# Patient Record
Sex: Female | Born: 1935
Health system: Southern US, Community
[De-identification: ages and names within clinical notes are randomized; demographics above are authoritative.]

## PROBLEM LIST (undated history)

## (undated) DIAGNOSIS — R519 Headache, unspecified: Secondary | ICD-10-CM

## (undated) DIAGNOSIS — G4700 Insomnia, unspecified: Secondary | ICD-10-CM

## (undated) DIAGNOSIS — M1711 Unilateral primary osteoarthritis, right knee: Secondary | ICD-10-CM

## (undated) DIAGNOSIS — K219 Gastro-esophageal reflux disease without esophagitis: Secondary | ICD-10-CM

## (undated) DIAGNOSIS — M810 Age-related osteoporosis without current pathological fracture: Secondary | ICD-10-CM

## (undated) DIAGNOSIS — R51 Headache: Secondary | ICD-10-CM

## (undated) DIAGNOSIS — T7840XA Allergy, unspecified, initial encounter: Secondary | ICD-10-CM

## (undated) DIAGNOSIS — I639 Cerebral infarction, unspecified: Secondary | ICD-10-CM

## (undated) DIAGNOSIS — R7989 Other specified abnormal findings of blood chemistry: Secondary | ICD-10-CM

## (undated) DIAGNOSIS — E039 Hypothyroidism, unspecified: Secondary | ICD-10-CM

## (undated) DIAGNOSIS — F419 Anxiety disorder, unspecified: Secondary | ICD-10-CM

## (undated) DIAGNOSIS — K5730 Diverticulosis of large intestine without perforation or abscess without bleeding: Secondary | ICD-10-CM

## (undated) DIAGNOSIS — G8929 Other chronic pain: Secondary | ICD-10-CM

## (undated) DIAGNOSIS — Z79890 Hormone replacement therapy (postmenopausal): Secondary | ICD-10-CM

## (undated) DIAGNOSIS — N189 Chronic kidney disease, unspecified: Secondary | ICD-10-CM

## (undated) DIAGNOSIS — J309 Allergic rhinitis, unspecified: Secondary | ICD-10-CM

## (undated) DIAGNOSIS — E785 Hyperlipidemia, unspecified: Secondary | ICD-10-CM

## (undated) DIAGNOSIS — IMO0002 Reserved for concepts with insufficient information to code with codable children: Secondary | ICD-10-CM

## (undated) DIAGNOSIS — I10 Essential (primary) hypertension: Secondary | ICD-10-CM

## (undated) DIAGNOSIS — IMO0001 Reserved for inherently not codable concepts without codable children: Secondary | ICD-10-CM

## (undated) DIAGNOSIS — J449 Chronic obstructive pulmonary disease, unspecified: Secondary | ICD-10-CM

## (undated) HISTORY — DX: Allergy, unspecified, initial encounter: T78.40XA

## (undated) HISTORY — DX: Other chronic pain: G89.29

## (undated) HISTORY — DX: Essential (primary) hypertension: I10

## (undated) HISTORY — DX: Diverticulosis of large intestine without perforation or abscess without bleeding: K57.30

## (undated) HISTORY — DX: Age-related osteoporosis without current pathological fracture: M81.0

## (undated) HISTORY — DX: Hyperlipidemia, unspecified: E78.5

## (undated) HISTORY — DX: Other specified abnormal findings of blood chemistry: R79.89

## (undated) HISTORY — PX: SHOULDER SURGERY: SHX246

## (undated) HISTORY — DX: Allergic rhinitis, unspecified: J30.9

## (undated) HISTORY — PX: OTHER SURGICAL HISTORY: SHX169

## (undated) HISTORY — DX: Reserved for concepts with insufficient information to code with codable children: IMO0002

## (undated) HISTORY — PX: BACK SURGERY: SHX140

## (undated) HISTORY — DX: Reserved for inherently not codable concepts without codable children: IMO0001

## (undated) HISTORY — DX: Insomnia, unspecified: G47.00

## (undated) HISTORY — PX: JOINT REPLACEMENT: SHX530

## (undated) HISTORY — DX: Hypothyroidism, unspecified: E03.9

## (undated) HISTORY — DX: Gastro-esophageal reflux disease without esophagitis: K21.9

## (undated) HISTORY — DX: Headache, unspecified: R51.9

## (undated) HISTORY — DX: Hormone replacement therapy: Z79.890

## (undated) HISTORY — PX: KNEE ARTHROSCOPY: SUR90

## (undated) HISTORY — DX: Headache: R51

---

## 1997-11-04 ENCOUNTER — Encounter: Admission: RE | Admit: 1997-11-04 | Discharge: 1998-02-02 | Payer: Self-pay

## 1998-06-07 ENCOUNTER — Other Ambulatory Visit: Admission: RE | Admit: 1998-06-07 | Discharge: 1998-06-07 | Payer: Self-pay | Admitting: Obstetrics and Gynecology

## 1998-07-12 ENCOUNTER — Encounter: Payer: Self-pay | Admitting: Emergency Medicine

## 1998-07-12 ENCOUNTER — Emergency Department (HOSPITAL_COMMUNITY): Admission: EM | Admit: 1998-07-12 | Discharge: 1998-07-12 | Payer: Self-pay | Admitting: Emergency Medicine

## 1999-06-07 ENCOUNTER — Emergency Department (HOSPITAL_COMMUNITY): Admission: EM | Admit: 1999-06-07 | Discharge: 1999-06-07 | Payer: Self-pay | Admitting: Emergency Medicine

## 1999-07-17 ENCOUNTER — Other Ambulatory Visit: Admission: RE | Admit: 1999-07-17 | Discharge: 1999-07-17 | Payer: Self-pay | Admitting: Obstetrics and Gynecology

## 1999-10-16 ENCOUNTER — Ambulatory Visit (HOSPITAL_COMMUNITY): Admission: RE | Admit: 1999-10-16 | Discharge: 1999-10-16 | Payer: Self-pay | Admitting: Specialist

## 1999-11-20 ENCOUNTER — Ambulatory Visit (HOSPITAL_COMMUNITY): Admission: RE | Admit: 1999-11-20 | Discharge: 1999-11-20 | Payer: Self-pay | Admitting: Specialist

## 2000-04-09 ENCOUNTER — Encounter: Payer: Self-pay | Admitting: Specialist

## 2000-04-10 ENCOUNTER — Ambulatory Visit (HOSPITAL_COMMUNITY): Admission: RE | Admit: 2000-04-10 | Discharge: 2000-04-10 | Payer: Self-pay | Admitting: Specialist

## 2000-11-12 ENCOUNTER — Other Ambulatory Visit: Admission: RE | Admit: 2000-11-12 | Discharge: 2000-11-12 | Payer: Self-pay | Admitting: Obstetrics and Gynecology

## 2001-02-27 ENCOUNTER — Encounter: Payer: Self-pay | Admitting: Gastroenterology

## 2001-02-27 ENCOUNTER — Encounter: Admission: RE | Admit: 2001-02-27 | Discharge: 2001-02-27 | Payer: Self-pay | Admitting: Gastroenterology

## 2002-11-02 ENCOUNTER — Ambulatory Visit (HOSPITAL_COMMUNITY): Admission: RE | Admit: 2002-11-02 | Discharge: 2002-11-02 | Payer: Self-pay | Admitting: Gastroenterology

## 2004-02-06 ENCOUNTER — Emergency Department (HOSPITAL_COMMUNITY): Admission: EM | Admit: 2004-02-06 | Discharge: 2004-02-06 | Payer: Self-pay | Admitting: Emergency Medicine

## 2004-03-19 ENCOUNTER — Ambulatory Visit (HOSPITAL_COMMUNITY): Admission: RE | Admit: 2004-03-19 | Discharge: 2004-03-19 | Payer: Self-pay | Admitting: Sports Medicine

## 2004-03-24 ENCOUNTER — Ambulatory Visit (HOSPITAL_COMMUNITY): Admission: RE | Admit: 2004-03-24 | Discharge: 2004-03-24 | Payer: Self-pay | Admitting: Sports Medicine

## 2004-04-05 ENCOUNTER — Encounter: Admission: RE | Admit: 2004-04-05 | Discharge: 2004-04-05 | Payer: Self-pay | Admitting: Sports Medicine

## 2004-04-19 ENCOUNTER — Encounter: Admission: RE | Admit: 2004-04-19 | Discharge: 2004-04-19 | Payer: Self-pay | Admitting: Sports Medicine

## 2004-05-03 ENCOUNTER — Encounter: Admission: RE | Admit: 2004-05-03 | Discharge: 2004-05-03 | Payer: Self-pay | Admitting: Sports Medicine

## 2004-06-28 ENCOUNTER — Ambulatory Visit: Payer: Self-pay | Admitting: Family Medicine

## 2004-07-05 ENCOUNTER — Ambulatory Visit: Payer: Self-pay | Admitting: Critical Care Medicine

## 2004-07-18 ENCOUNTER — Other Ambulatory Visit: Admission: RE | Admit: 2004-07-18 | Discharge: 2004-07-18 | Payer: Self-pay | Admitting: Obstetrics & Gynecology

## 2004-07-26 ENCOUNTER — Ambulatory Visit: Payer: Self-pay | Admitting: Family Medicine

## 2004-07-28 ENCOUNTER — Ambulatory Visit: Payer: Self-pay | Admitting: Family Medicine

## 2004-08-09 ENCOUNTER — Ambulatory Visit: Payer: Self-pay | Admitting: Critical Care Medicine

## 2004-09-08 ENCOUNTER — Ambulatory Visit: Payer: Self-pay | Admitting: Internal Medicine

## 2004-09-12 ENCOUNTER — Ambulatory Visit: Payer: Self-pay | Admitting: Pulmonary Disease

## 2004-10-05 ENCOUNTER — Ambulatory Visit: Payer: Self-pay | Admitting: Critical Care Medicine

## 2004-10-24 ENCOUNTER — Ambulatory Visit: Payer: Self-pay | Admitting: Family Medicine

## 2004-10-27 ENCOUNTER — Ambulatory Visit: Payer: Self-pay | Admitting: Family Medicine

## 2004-11-22 ENCOUNTER — Emergency Department (HOSPITAL_COMMUNITY): Admission: EM | Admit: 2004-11-22 | Discharge: 2004-11-22 | Payer: Self-pay | Admitting: Emergency Medicine

## 2004-11-22 ENCOUNTER — Inpatient Hospital Stay (HOSPITAL_COMMUNITY): Admission: AD | Admit: 2004-11-22 | Discharge: 2004-11-24 | Payer: Self-pay | Admitting: Internal Medicine

## 2004-11-23 ENCOUNTER — Ambulatory Visit: Payer: Self-pay | Admitting: Internal Medicine

## 2004-11-30 ENCOUNTER — Ambulatory Visit: Payer: Self-pay | Admitting: Critical Care Medicine

## 2004-12-08 ENCOUNTER — Ambulatory Visit: Payer: Self-pay | Admitting: Family Medicine

## 2004-12-25 ENCOUNTER — Ambulatory Visit: Payer: Self-pay | Admitting: Internal Medicine

## 2004-12-28 ENCOUNTER — Ambulatory Visit: Payer: Self-pay | Admitting: Critical Care Medicine

## 2005-01-06 ENCOUNTER — Encounter: Payer: Self-pay | Admitting: Family Medicine

## 2005-01-06 LAB — CONVERTED CEMR LAB: Pap Smear: NORMAL

## 2005-01-31 ENCOUNTER — Ambulatory Visit: Payer: Self-pay | Admitting: Critical Care Medicine

## 2005-02-13 ENCOUNTER — Ambulatory Visit: Payer: Self-pay | Admitting: Family Medicine

## 2005-02-19 ENCOUNTER — Ambulatory Visit: Payer: Self-pay | Admitting: Family Medicine

## 2005-03-28 ENCOUNTER — Ambulatory Visit: Payer: Self-pay | Admitting: Internal Medicine

## 2005-03-30 ENCOUNTER — Ambulatory Visit: Payer: Self-pay | Admitting: Internal Medicine

## 2005-04-03 ENCOUNTER — Ambulatory Visit: Payer: Self-pay | Admitting: Family Medicine

## 2005-04-06 ENCOUNTER — Ambulatory Visit: Payer: Self-pay | Admitting: Family Medicine

## 2005-04-24 ENCOUNTER — Ambulatory Visit: Payer: Self-pay | Admitting: Family Medicine

## 2005-05-05 ENCOUNTER — Encounter: Admission: RE | Admit: 2005-05-05 | Discharge: 2005-05-05 | Payer: Self-pay | Admitting: Sports Medicine

## 2005-05-10 ENCOUNTER — Ambulatory Visit: Payer: Self-pay | Admitting: Critical Care Medicine

## 2005-06-04 ENCOUNTER — Ambulatory Visit: Payer: Self-pay | Admitting: Critical Care Medicine

## 2005-06-06 ENCOUNTER — Ambulatory Visit: Payer: Self-pay | Admitting: Family Medicine

## 2005-06-26 ENCOUNTER — Ambulatory Visit: Payer: Self-pay | Admitting: Critical Care Medicine

## 2005-07-18 ENCOUNTER — Ambulatory Visit: Payer: Self-pay | Admitting: Internal Medicine

## 2005-07-23 ENCOUNTER — Ambulatory Visit: Payer: Self-pay | Admitting: Family Medicine

## 2005-09-11 ENCOUNTER — Ambulatory Visit: Payer: Self-pay | Admitting: Critical Care Medicine

## 2005-09-14 ENCOUNTER — Inpatient Hospital Stay (HOSPITAL_COMMUNITY): Admission: RE | Admit: 2005-09-14 | Discharge: 2005-09-16 | Payer: Self-pay | Admitting: Orthopedic Surgery

## 2005-09-14 ENCOUNTER — Ambulatory Visit: Payer: Self-pay | Admitting: Critical Care Medicine

## 2005-10-30 ENCOUNTER — Ambulatory Visit: Payer: Self-pay | Admitting: Family Medicine

## 2005-10-31 ENCOUNTER — Ambulatory Visit: Payer: Self-pay | Admitting: Critical Care Medicine

## 2005-11-07 ENCOUNTER — Ambulatory Visit: Payer: Self-pay | Admitting: Internal Medicine

## 2005-11-12 ENCOUNTER — Ambulatory Visit: Payer: Self-pay | Admitting: Family Medicine

## 2005-11-13 ENCOUNTER — Ambulatory Visit: Payer: Self-pay | Admitting: Family Medicine

## 2005-12-27 ENCOUNTER — Ambulatory Visit: Payer: Self-pay | Admitting: Critical Care Medicine

## 2006-02-26 ENCOUNTER — Ambulatory Visit: Payer: Self-pay | Admitting: Family Medicine

## 2006-03-01 ENCOUNTER — Ambulatory Visit: Payer: Self-pay | Admitting: Family Medicine

## 2006-04-01 ENCOUNTER — Ambulatory Visit: Payer: Self-pay | Admitting: Internal Medicine

## 2006-04-02 ENCOUNTER — Ambulatory Visit: Payer: Self-pay | Admitting: Internal Medicine

## 2006-04-11 ENCOUNTER — Ambulatory Visit: Payer: Self-pay | Admitting: Critical Care Medicine

## 2006-04-25 ENCOUNTER — Ambulatory Visit: Payer: Self-pay | Admitting: Internal Medicine

## 2006-05-27 ENCOUNTER — Ambulatory Visit: Payer: Self-pay | Admitting: Critical Care Medicine

## 2006-07-11 ENCOUNTER — Ambulatory Visit: Payer: Self-pay | Admitting: Family Medicine

## 2006-08-19 ENCOUNTER — Ambulatory Visit: Payer: Self-pay | Admitting: Internal Medicine

## 2006-08-20 ENCOUNTER — Ambulatory Visit: Payer: Self-pay | Admitting: Critical Care Medicine

## 2006-10-16 ENCOUNTER — Ambulatory Visit (HOSPITAL_COMMUNITY): Admission: RE | Admit: 2006-10-16 | Discharge: 2006-10-16 | Payer: Self-pay | Admitting: Sports Medicine

## 2006-12-23 ENCOUNTER — Telehealth: Payer: Self-pay | Admitting: Family Medicine

## 2006-12-24 ENCOUNTER — Ambulatory Visit: Payer: Self-pay | Admitting: Internal Medicine

## 2006-12-24 ENCOUNTER — Encounter: Payer: Self-pay | Admitting: Family Medicine

## 2006-12-24 ENCOUNTER — Ambulatory Visit: Payer: Self-pay | Admitting: Family Medicine

## 2006-12-24 DIAGNOSIS — G4700 Insomnia, unspecified: Secondary | ICD-10-CM

## 2006-12-24 DIAGNOSIS — E785 Hyperlipidemia, unspecified: Secondary | ICD-10-CM

## 2006-12-24 DIAGNOSIS — IMO0002 Reserved for concepts with insufficient information to code with codable children: Secondary | ICD-10-CM | POA: Insufficient documentation

## 2006-12-24 DIAGNOSIS — M797 Fibromyalgia: Secondary | ICD-10-CM

## 2006-12-24 DIAGNOSIS — E039 Hypothyroidism, unspecified: Secondary | ICD-10-CM | POA: Insufficient documentation

## 2006-12-24 DIAGNOSIS — K219 Gastro-esophageal reflux disease without esophagitis: Secondary | ICD-10-CM | POA: Insufficient documentation

## 2006-12-24 DIAGNOSIS — K5730 Diverticulosis of large intestine without perforation or abscess without bleeding: Secondary | ICD-10-CM | POA: Insufficient documentation

## 2006-12-24 DIAGNOSIS — R7989 Other specified abnormal findings of blood chemistry: Secondary | ICD-10-CM | POA: Insufficient documentation

## 2006-12-24 DIAGNOSIS — I10 Essential (primary) hypertension: Secondary | ICD-10-CM | POA: Insufficient documentation

## 2006-12-24 DIAGNOSIS — E782 Mixed hyperlipidemia: Secondary | ICD-10-CM | POA: Insufficient documentation

## 2006-12-24 DIAGNOSIS — T7840XA Allergy, unspecified, initial encounter: Secondary | ICD-10-CM | POA: Insufficient documentation

## 2006-12-24 DIAGNOSIS — G43009 Migraine without aura, not intractable, without status migrainosus: Secondary | ICD-10-CM

## 2006-12-24 HISTORY — DX: Insomnia, unspecified: G47.00

## 2006-12-24 HISTORY — DX: Fibromyalgia: M79.7

## 2006-12-24 HISTORY — DX: Migraine without aura, not intractable, without status migrainosus: G43.009

## 2006-12-24 HISTORY — DX: Hypothyroidism, unspecified: E03.9

## 2006-12-24 HISTORY — DX: Hyperlipidemia, unspecified: E78.5

## 2006-12-24 LAB — CONVERTED CEMR LAB
Free T4: 0.6 ng/dL (ref 0.6–1.6)
T3, Free: 2.4 pg/mL (ref 2.3–4.2)
TSH: 5.88 microintl units/mL — ABNORMAL HIGH (ref 0.35–5.50)

## 2006-12-26 ENCOUNTER — Ambulatory Visit: Payer: Self-pay | Admitting: Family Medicine

## 2006-12-26 DIAGNOSIS — G894 Chronic pain syndrome: Secondary | ICD-10-CM | POA: Insufficient documentation

## 2007-01-16 ENCOUNTER — Encounter: Payer: Self-pay | Admitting: Family Medicine

## 2007-01-16 ENCOUNTER — Ambulatory Visit: Payer: Self-pay | Admitting: Pain Medicine

## 2007-02-03 ENCOUNTER — Ambulatory Visit: Payer: Self-pay | Admitting: Pain Medicine

## 2007-02-09 ENCOUNTER — Encounter: Payer: Self-pay | Admitting: Family Medicine

## 2007-03-12 ENCOUNTER — Ambulatory Visit: Payer: Self-pay | Admitting: Family Medicine

## 2007-03-12 LAB — CONVERTED CEMR LAB
ALT: 16 units/L (ref 0–35)
AST: 28 units/L (ref 0–37)
Albumin: 3.3 g/dL — ABNORMAL LOW (ref 3.5–5.2)
Alkaline Phosphatase: 61 units/L (ref 39–117)
BUN: 11 mg/dL (ref 6–23)
Basophils Absolute: 0 10*3/uL (ref 0.0–0.1)
Basophils Relative: 0.8 % (ref 0.0–1.0)
Bilirubin, Direct: 0.1 mg/dL (ref 0.0–0.3)
CO2: 34 meq/L — ABNORMAL HIGH (ref 19–32)
Calcium: 9 mg/dL (ref 8.4–10.5)
Chloride: 108 meq/L (ref 96–112)
Cholesterol: 214 mg/dL (ref 0–200)
Creatinine, Ser: 0.6 mg/dL (ref 0.4–1.2)
Creatinine,U: 72.1 mg/dL
Direct LDL: 157.9 mg/dL
Eosinophils Absolute: 0.2 10*3/uL (ref 0.0–0.6)
Eosinophils Relative: 3.9 % (ref 0.0–5.0)
Free T4: 0.8 ng/dL (ref 0.6–1.6)
GFR calc Af Amer: 126 mL/min
GFR calc non Af Amer: 104 mL/min
Glucose, Bld: 98 mg/dL (ref 70–99)
HCT: 34.5 % — ABNORMAL LOW (ref 36.0–46.0)
HDL: 40 mg/dL (ref >39.0)
Hemoglobin: 12 g/dL (ref 12.0–15.0)
Lymphocytes Relative: 36.6 % (ref 12.0–46.0)
MCHC: 34.8 g/dL (ref 30.0–36.0)
MCV: 95.3 fL (ref 78.0–100.0)
Microalb Creat Ratio: 5.5 mg/g (ref 0.0–30.0)
Microalb, Ur: 0.4 mg/dL (ref 0.0–1.9)
Monocytes Absolute: 0.6 10*3/uL (ref 0.2–0.7)
Monocytes Relative: 9.7 % (ref 3.0–11.0)
Neutro Abs: 2.9 10*3/uL (ref 1.4–7.7)
Neutrophils Relative %: 49 % (ref 43.0–77.0)
Platelets: 226 10*3/uL (ref 150–400)
Potassium: 3.8 meq/L (ref 3.5–5.1)
RBC: 3.62 M/uL — ABNORMAL LOW (ref 3.87–5.11)
RDW: 13.5 % (ref 11.5–14.6)
Sodium: 146 meq/L — ABNORMAL HIGH (ref 135–145)
TSH: 4.54 microintl units/mL (ref 0.35–5.50)
Total Bilirubin: 0.6 mg/dL (ref 0.3–1.2)
Total CHOL/HDL Ratio: 5.3
Total Protein: 7 g/dL (ref 6.0–8.3)
Triglycerides: 143 mg/dL (ref 0–149)
VLDL: 29 mg/dL (ref 0–40)
WBC: 5.8 10*3/uL (ref 4.5–10.5)

## 2007-03-13 ENCOUNTER — Ambulatory Visit: Payer: Self-pay | Admitting: Pain Medicine

## 2007-03-13 ENCOUNTER — Encounter: Payer: Self-pay | Admitting: Family Medicine

## 2007-03-17 ENCOUNTER — Ambulatory Visit: Payer: Self-pay | Admitting: Family Medicine

## 2007-03-21 ENCOUNTER — Ambulatory Visit: Payer: Self-pay | Admitting: Critical Care Medicine

## 2007-03-28 ENCOUNTER — Encounter (INDEPENDENT_AMBULATORY_CARE_PROVIDER_SITE_OTHER): Payer: Self-pay | Admitting: *Deleted

## 2007-03-28 ENCOUNTER — Ambulatory Visit: Payer: Self-pay | Admitting: Family Medicine

## 2007-04-15 ENCOUNTER — Ambulatory Visit: Payer: Self-pay | Admitting: Critical Care Medicine

## 2007-04-15 DIAGNOSIS — J3089 Other allergic rhinitis: Secondary | ICD-10-CM | POA: Insufficient documentation

## 2007-04-15 DIAGNOSIS — J302 Other seasonal allergic rhinitis: Secondary | ICD-10-CM | POA: Insufficient documentation

## 2007-04-23 ENCOUNTER — Encounter: Payer: Self-pay | Admitting: Family Medicine

## 2007-04-23 ENCOUNTER — Ambulatory Visit: Payer: Self-pay | Admitting: Pain Medicine

## 2007-04-28 ENCOUNTER — Ambulatory Visit: Payer: Self-pay | Admitting: Family Medicine

## 2007-04-29 LAB — CONVERTED CEMR LAB
ALT: 21 units/L (ref 0–35)
AST: 27 units/L (ref 0–37)

## 2007-05-01 ENCOUNTER — Ambulatory Visit: Payer: Self-pay | Admitting: Critical Care Medicine

## 2007-05-07 ENCOUNTER — Ambulatory Visit: Payer: Self-pay | Admitting: Internal Medicine

## 2007-05-07 ENCOUNTER — Ambulatory Visit: Payer: Self-pay | Admitting: Family Medicine

## 2007-05-19 ENCOUNTER — Telehealth: Payer: Self-pay | Admitting: Family Medicine

## 2007-05-22 ENCOUNTER — Encounter: Payer: Self-pay | Admitting: Family Medicine

## 2007-05-22 ENCOUNTER — Ambulatory Visit: Payer: Self-pay | Admitting: Pain Medicine

## 2007-05-23 ENCOUNTER — Encounter: Admission: RE | Admit: 2007-05-23 | Discharge: 2007-05-23 | Payer: Self-pay | Admitting: Neurology

## 2007-05-27 ENCOUNTER — Telehealth: Payer: Self-pay | Admitting: Family Medicine

## 2007-05-28 ENCOUNTER — Encounter: Payer: Self-pay | Admitting: Family Medicine

## 2007-05-28 ENCOUNTER — Ambulatory Visit: Payer: Self-pay | Admitting: Pain Medicine

## 2007-05-28 ENCOUNTER — Ambulatory Visit: Payer: Self-pay | Admitting: Internal Medicine

## 2007-06-10 ENCOUNTER — Encounter: Payer: Self-pay | Admitting: Family Medicine

## 2007-06-18 ENCOUNTER — Ambulatory Visit: Payer: Self-pay | Admitting: Family Medicine

## 2007-06-18 LAB — CONVERTED CEMR LAB
ALT: 21 units/L (ref 0–35)
AST: 35 units/L (ref 0–37)
Cholesterol: 138 mg/dL (ref 0–200)
HDL: 53.3 mg/dL (ref >39.0)
LDL Cholesterol: 72 mg/dL (ref 0–99)
Total CHOL/HDL Ratio: 2.6
Triglycerides: 63 mg/dL (ref 0–149)
VLDL: 13 mg/dL (ref 0–40)

## 2007-06-19 ENCOUNTER — Ambulatory Visit: Payer: Self-pay | Admitting: Pain Medicine

## 2007-06-19 ENCOUNTER — Encounter: Payer: Self-pay | Admitting: Family Medicine

## 2007-06-23 ENCOUNTER — Telehealth: Payer: Self-pay | Admitting: Family Medicine

## 2007-06-23 ENCOUNTER — Ambulatory Visit: Payer: Self-pay | Admitting: Family Medicine

## 2007-06-23 DIAGNOSIS — M810 Age-related osteoporosis without current pathological fracture: Secondary | ICD-10-CM

## 2007-06-23 HISTORY — DX: Age-related osteoporosis without current pathological fracture: M81.0

## 2007-06-29 ENCOUNTER — Telehealth: Payer: Self-pay | Admitting: Internal Medicine

## 2007-07-01 ENCOUNTER — Ambulatory Visit: Payer: Self-pay | Admitting: Family Medicine

## 2007-07-04 ENCOUNTER — Ambulatory Visit: Payer: Self-pay | Admitting: Critical Care Medicine

## 2007-07-11 ENCOUNTER — Emergency Department (HOSPITAL_COMMUNITY): Admission: EM | Admit: 2007-07-11 | Discharge: 2007-07-11 | Payer: Self-pay | Admitting: Family Medicine

## 2007-07-11 ENCOUNTER — Telehealth (INDEPENDENT_AMBULATORY_CARE_PROVIDER_SITE_OTHER): Payer: Self-pay | Admitting: *Deleted

## 2007-08-04 ENCOUNTER — Ambulatory Visit: Payer: Self-pay | Admitting: Critical Care Medicine

## 2007-08-13 ENCOUNTER — Telehealth: Payer: Self-pay | Admitting: Family Medicine

## 2007-08-29 ENCOUNTER — Ambulatory Visit: Payer: Self-pay | Admitting: Family Medicine

## 2007-09-15 ENCOUNTER — Telehealth: Payer: Self-pay | Admitting: Family Medicine

## 2007-09-19 ENCOUNTER — Telehealth: Payer: Self-pay | Admitting: Family Medicine

## 2007-09-23 ENCOUNTER — Encounter: Payer: Self-pay | Admitting: Family Medicine

## 2007-09-23 ENCOUNTER — Encounter: Admission: RE | Admit: 2007-09-23 | Discharge: 2007-09-23 | Payer: Self-pay | Admitting: Sports Medicine

## 2007-09-25 ENCOUNTER — Encounter: Payer: Self-pay | Admitting: Family Medicine

## 2007-09-30 ENCOUNTER — Ambulatory Visit: Payer: Self-pay | Admitting: Family Medicine

## 2007-10-02 ENCOUNTER — Ambulatory Visit: Payer: Self-pay | Admitting: Critical Care Medicine

## 2007-10-08 ENCOUNTER — Encounter: Payer: Self-pay | Admitting: Family Medicine

## 2007-10-14 ENCOUNTER — Telehealth (INDEPENDENT_AMBULATORY_CARE_PROVIDER_SITE_OTHER): Payer: Self-pay | Admitting: *Deleted

## 2007-11-03 ENCOUNTER — Inpatient Hospital Stay (HOSPITAL_COMMUNITY): Admission: RE | Admit: 2007-11-03 | Discharge: 2007-11-07 | Payer: Self-pay | Admitting: Orthopedic Surgery

## 2007-11-18 ENCOUNTER — Telehealth: Payer: Self-pay | Admitting: Family Medicine

## 2007-11-19 ENCOUNTER — Encounter: Payer: Self-pay | Admitting: Family Medicine

## 2007-12-17 ENCOUNTER — Encounter: Payer: Self-pay | Admitting: Family Medicine

## 2007-12-19 ENCOUNTER — Telehealth: Payer: Self-pay | Admitting: Family Medicine

## 2007-12-22 ENCOUNTER — Telehealth: Payer: Self-pay | Admitting: Family Medicine

## 2008-01-05 ENCOUNTER — Telehealth: Payer: Self-pay | Admitting: Family Medicine

## 2008-01-12 ENCOUNTER — Encounter: Payer: Self-pay | Admitting: Family Medicine

## 2008-01-20 ENCOUNTER — Telehealth: Payer: Self-pay | Admitting: Family Medicine

## 2008-02-09 ENCOUNTER — Encounter: Payer: Self-pay | Admitting: Family Medicine

## 2008-02-23 ENCOUNTER — Telehealth: Payer: Self-pay | Admitting: Family Medicine

## 2008-03-01 ENCOUNTER — Ambulatory Visit: Payer: Self-pay | Admitting: Family Medicine

## 2008-03-01 DIAGNOSIS — F419 Anxiety disorder, unspecified: Secondary | ICD-10-CM | POA: Insufficient documentation

## 2008-03-01 DIAGNOSIS — F411 Generalized anxiety disorder: Secondary | ICD-10-CM

## 2008-03-01 HISTORY — DX: Generalized anxiety disorder: F41.1

## 2008-03-08 ENCOUNTER — Ambulatory Visit: Payer: Self-pay | Admitting: Cardiology

## 2008-03-08 ENCOUNTER — Ambulatory Visit: Payer: Self-pay | Admitting: Internal Medicine

## 2008-03-08 ENCOUNTER — Inpatient Hospital Stay (HOSPITAL_COMMUNITY): Admission: EM | Admit: 2008-03-08 | Discharge: 2008-03-11 | Payer: Self-pay | Admitting: Emergency Medicine

## 2008-03-09 ENCOUNTER — Ambulatory Visit: Payer: Self-pay | Admitting: Surgery

## 2008-03-09 ENCOUNTER — Encounter: Payer: Self-pay | Admitting: Family Medicine

## 2008-03-09 ENCOUNTER — Encounter (INDEPENDENT_AMBULATORY_CARE_PROVIDER_SITE_OTHER): Payer: Self-pay | Admitting: Internal Medicine

## 2008-03-10 ENCOUNTER — Encounter: Payer: Self-pay | Admitting: Family Medicine

## 2008-03-10 ENCOUNTER — Ambulatory Visit: Payer: Self-pay | Admitting: Physical Medicine & Rehabilitation

## 2008-03-11 ENCOUNTER — Inpatient Hospital Stay (HOSPITAL_COMMUNITY)
Admission: RE | Admit: 2008-03-11 | Discharge: 2008-03-17 | Payer: Self-pay | Admitting: Physical Medicine & Rehabilitation

## 2008-03-11 ENCOUNTER — Encounter: Payer: Self-pay | Admitting: Family Medicine

## 2008-03-17 ENCOUNTER — Encounter: Payer: Self-pay | Admitting: Family Medicine

## 2008-04-09 ENCOUNTER — Ambulatory Visit: Payer: Self-pay | Admitting: Internal Medicine

## 2008-04-09 ENCOUNTER — Inpatient Hospital Stay (HOSPITAL_COMMUNITY): Admission: EM | Admit: 2008-04-09 | Discharge: 2008-04-10 | Payer: Self-pay | Admitting: Emergency Medicine

## 2008-04-09 ENCOUNTER — Encounter: Payer: Self-pay | Admitting: Family Medicine

## 2008-04-10 ENCOUNTER — Encounter: Payer: Self-pay | Admitting: Internal Medicine

## 2008-04-10 ENCOUNTER — Encounter: Payer: Self-pay | Admitting: Family Medicine

## 2008-04-10 DIAGNOSIS — D649 Anemia, unspecified: Secondary | ICD-10-CM

## 2008-04-10 DIAGNOSIS — N390 Urinary tract infection, site not specified: Secondary | ICD-10-CM | POA: Insufficient documentation

## 2008-04-10 HISTORY — DX: Anemia, unspecified: D64.9

## 2008-04-19 ENCOUNTER — Ambulatory Visit: Payer: Self-pay | Admitting: Family Medicine

## 2008-04-26 ENCOUNTER — Telehealth: Payer: Self-pay | Admitting: Family Medicine

## 2008-05-11 ENCOUNTER — Ambulatory Visit: Payer: Self-pay | Admitting: Internal Medicine

## 2008-05-21 ENCOUNTER — Telehealth: Payer: Self-pay | Admitting: Family Medicine

## 2008-05-24 ENCOUNTER — Encounter: Payer: Self-pay | Admitting: Infectious Diseases

## 2008-05-26 ENCOUNTER — Encounter: Payer: Self-pay | Admitting: Family Medicine

## 2008-06-16 ENCOUNTER — Telehealth: Payer: Self-pay | Admitting: Family Medicine

## 2008-07-14 ENCOUNTER — Encounter: Payer: Self-pay | Admitting: Family Medicine

## 2008-07-27 ENCOUNTER — Telehealth: Payer: Self-pay | Admitting: Family Medicine

## 2008-08-03 ENCOUNTER — Telehealth: Payer: Self-pay | Admitting: Family Medicine

## 2008-08-03 ENCOUNTER — Encounter: Payer: Self-pay | Admitting: Critical Care Medicine

## 2008-08-05 ENCOUNTER — Ambulatory Visit: Payer: Self-pay | Admitting: Critical Care Medicine

## 2008-08-12 ENCOUNTER — Telehealth: Payer: Self-pay | Admitting: Family Medicine

## 2008-09-23 ENCOUNTER — Ambulatory Visit: Payer: Self-pay | Admitting: Family Medicine

## 2008-09-24 ENCOUNTER — Telehealth: Payer: Self-pay | Admitting: Family Medicine

## 2008-09-27 ENCOUNTER — Telehealth: Payer: Self-pay | Admitting: Family Medicine

## 2008-09-28 ENCOUNTER — Telehealth: Payer: Self-pay | Admitting: Family Medicine

## 2008-09-29 ENCOUNTER — Encounter
Admission: RE | Admit: 2008-09-29 | Discharge: 2008-09-29 | Payer: Self-pay | Admitting: Physical Medicine & Rehabilitation

## 2008-11-26 ENCOUNTER — Ambulatory Visit: Payer: Self-pay | Admitting: Family Medicine

## 2008-11-27 LAB — CONVERTED CEMR LAB
ALT: 10 units/L (ref 0–35)
AST: 25 units/L (ref 0–37)
Albumin: 3.8 g/dL (ref 3.5–5.2)
Alkaline Phosphatase: 54 units/L (ref 39–117)
BUN: 14 mg/dL (ref 6–23)
Basophils Absolute: 0 10*3/uL (ref 0.0–0.1)
Basophils Relative: 0.6 % (ref 0.0–3.0)
Bilirubin, Direct: 0.2 mg/dL (ref 0.0–0.3)
CO2: 31 meq/L (ref 19–32)
Calcium: 8.9 mg/dL (ref 8.4–10.5)
Chloride: 112 meq/L (ref 96–112)
Cholesterol: 114 mg/dL (ref 0–200)
Creatinine, Ser: 0.7 mg/dL (ref 0.4–1.2)
Creatinine,U: 145.8 mg/dL
Eosinophils Absolute: 0.3 10*3/uL (ref 0.0–0.7)
Eosinophils Relative: 5.2 % — ABNORMAL HIGH (ref 0.0–5.0)
Free T4: 0.6 ng/dL (ref 0.6–1.6)
GFR calc non Af Amer: 86.52 mL/min (ref >60)
Glucose, Bld: 95 mg/dL (ref 70–99)
HCT: 36.8 % (ref 36.0–46.0)
HDL: 35.6 mg/dL — ABNORMAL LOW (ref >39.00)
Hemoglobin: 12.7 g/dL (ref 12.0–15.0)
LDL Cholesterol: 60 mg/dL (ref 0–99)
Lymphocytes Relative: 41.6 % (ref 12.0–46.0)
Lymphs Abs: 2.5 10*3/uL (ref 0.7–4.0)
MCHC: 34.6 g/dL (ref 30.0–36.0)
MCV: 95.8 fL (ref 78.0–100.0)
Microalb Creat Ratio: 8.9 mg/g (ref 0.0–30.0)
Microalb, Ur: 1.3 mg/dL (ref 0.0–1.9)
Monocytes Absolute: 0.5 10*3/uL (ref 0.1–1.0)
Monocytes Relative: 9.1 % (ref 3.0–12.0)
Neutro Abs: 2.7 10*3/uL (ref 1.4–7.7)
Neutrophils Relative %: 43.5 % (ref 43.0–77.0)
Platelets: 180 10*3/uL (ref 150.0–400.0)
Potassium: 3.9 meq/L (ref 3.5–5.1)
RBC: 3.84 M/uL — ABNORMAL LOW (ref 3.87–5.11)
RDW: 14.4 % (ref 11.5–14.6)
Sed Rate: 32 mm/hr — ABNORMAL HIGH (ref 0–22)
Sodium: 146 meq/L — ABNORMAL HIGH (ref 135–145)
TSH: 13.69 microintl units/mL — ABNORMAL HIGH (ref 0.35–5.50)
Total Bilirubin: 0.6 mg/dL (ref 0.3–1.2)
Total CHOL/HDL Ratio: 3
Total Protein: 7.1 g/dL (ref 6.0–8.3)
Triglycerides: 93 mg/dL (ref 0.0–149.0)
VLDL: 18.6 mg/dL (ref 0.0–40.0)
WBC: 6 10*3/uL (ref 4.5–10.5)

## 2008-11-29 LAB — CONVERTED CEMR LAB: Vit D, 25-Hydroxy: 39 ng/mL (ref 30–89)

## 2008-12-01 ENCOUNTER — Ambulatory Visit: Payer: Self-pay | Admitting: Family Medicine

## 2008-12-09 ENCOUNTER — Encounter: Payer: Self-pay | Admitting: Family Medicine

## 2008-12-17 ENCOUNTER — Ambulatory Visit: Payer: Self-pay | Admitting: Critical Care Medicine

## 2008-12-20 ENCOUNTER — Encounter: Payer: Self-pay | Admitting: Critical Care Medicine

## 2008-12-25 IMAGING — CT CT HEAD W/O CM
1 of 2 series · 13 of 30 positions shown, 17 images · non-contrast
Comparison: MR 03/09/2008

CLINICAL DATA: Unsteady gait, slurred speech

CT HEAD WITHOUT CONTRAST
TECHNIQUE: Contiguous axial images were obtained from the base of
the skull through the vertex without contrast.

[Series 2: brain · axial · 0.47mm/px · z∈[+114,+246]mm · 13 of 32 slices shown, 17 images]
[im 3/32  brain]
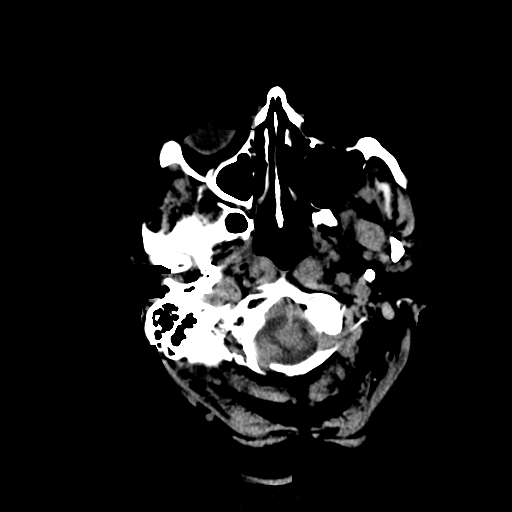
[im 3/32  bone]
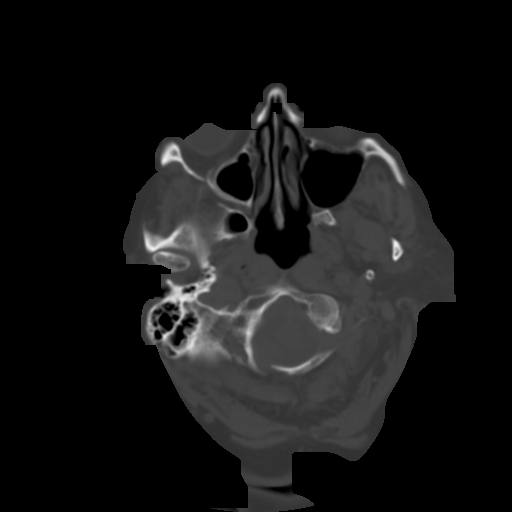
[im 5/32  brain]
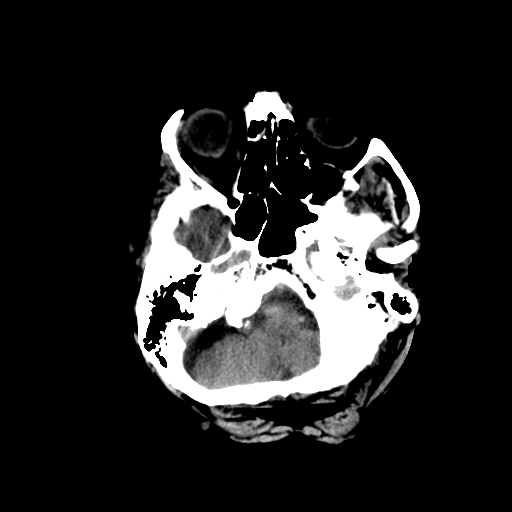
[im 7/32  brain]
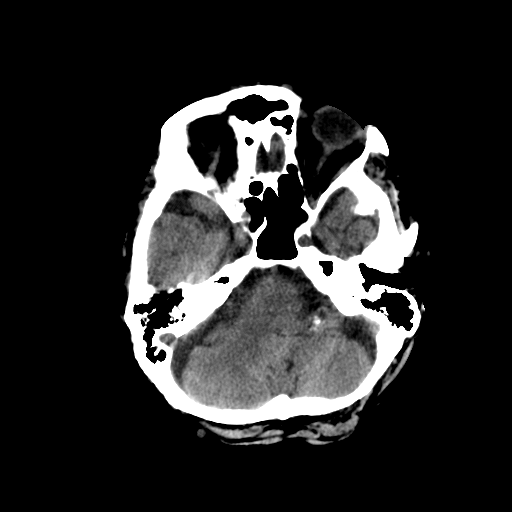
[im 9/32  brain]
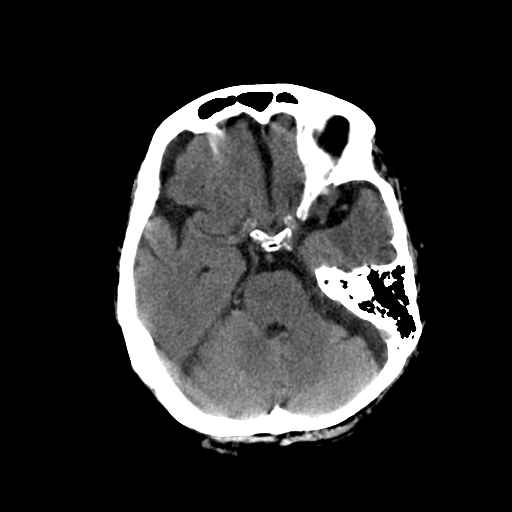
[im 12/32  brain]
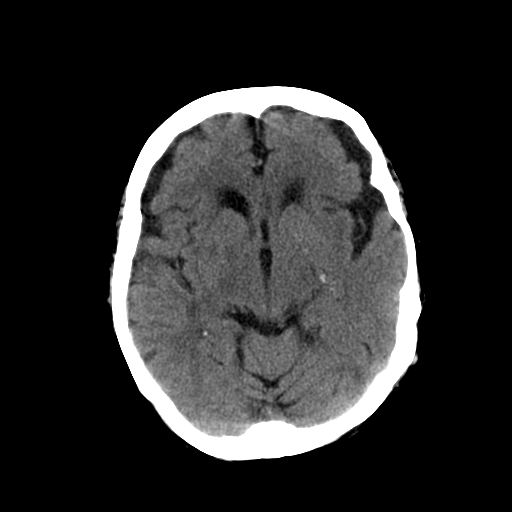
[im 12/32  bone]
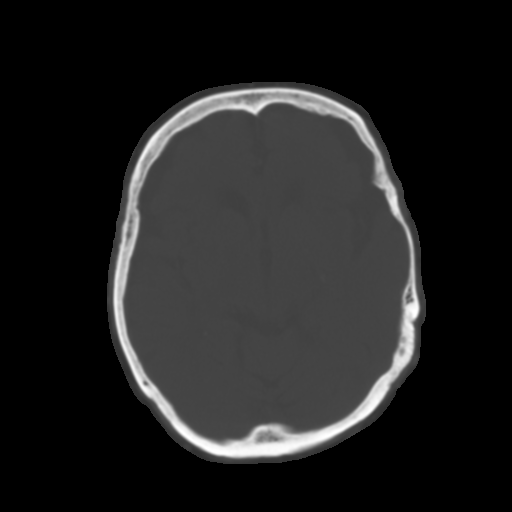
[im 14/32  brain]
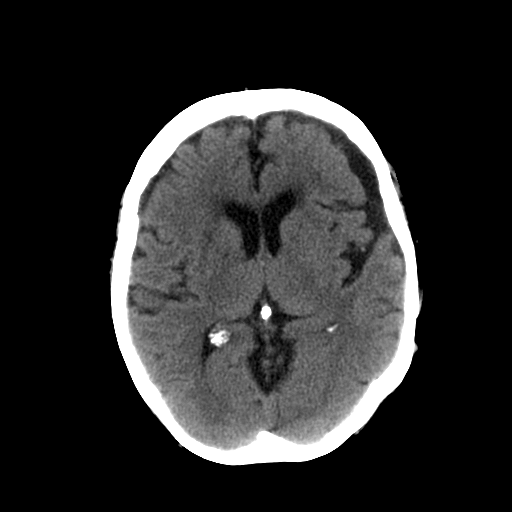
[im 16/32  brain]
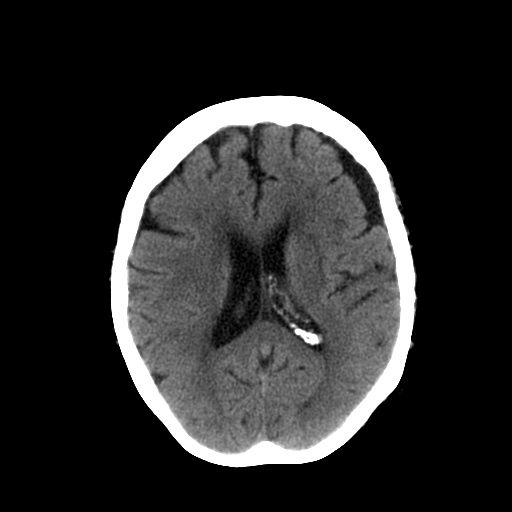
[im 18/32  brain]
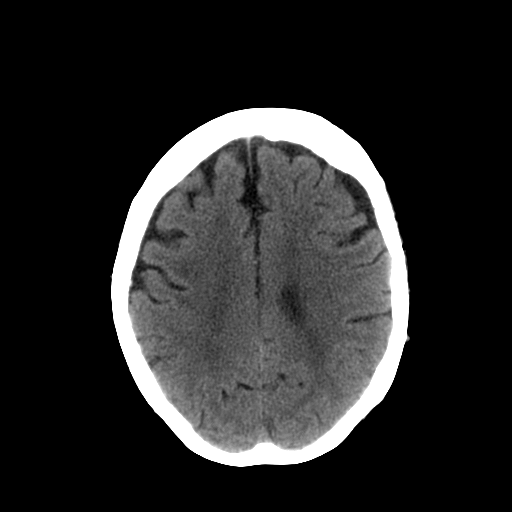
[im 20/32  brain]
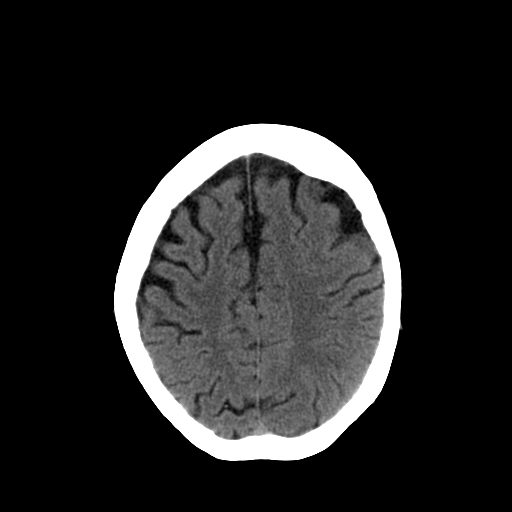
[im 20/32  bone]
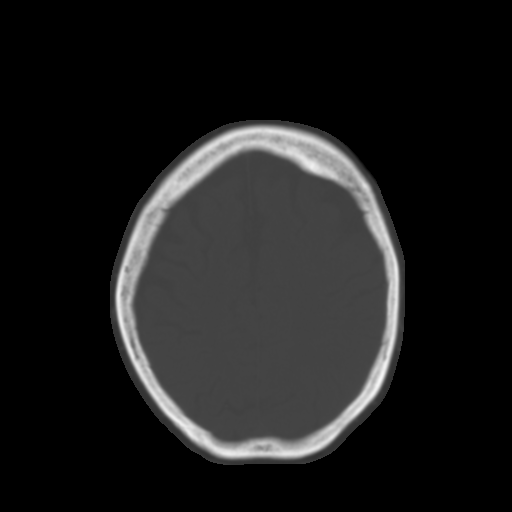
[im 23/32  brain]
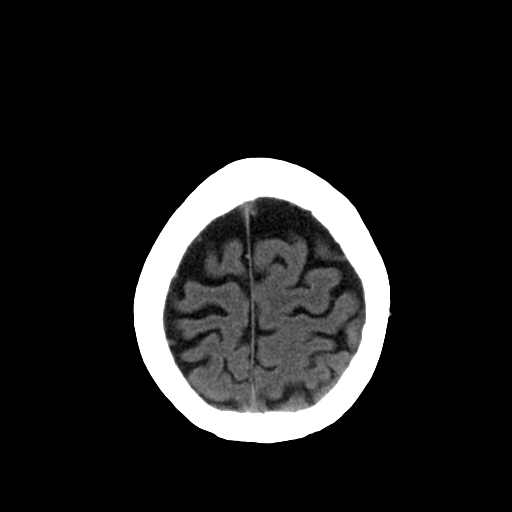
[im 25/32  brain]
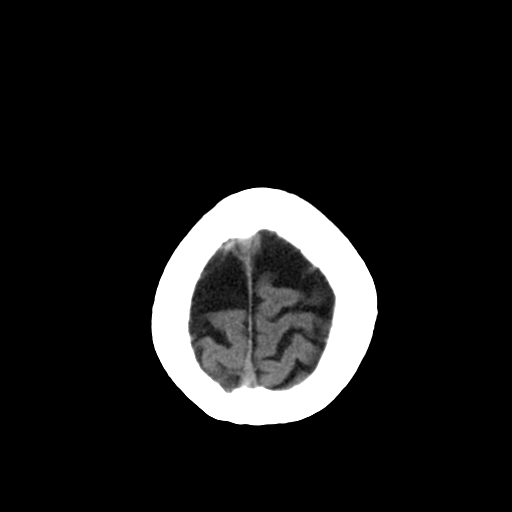
[im 27/32  brain]
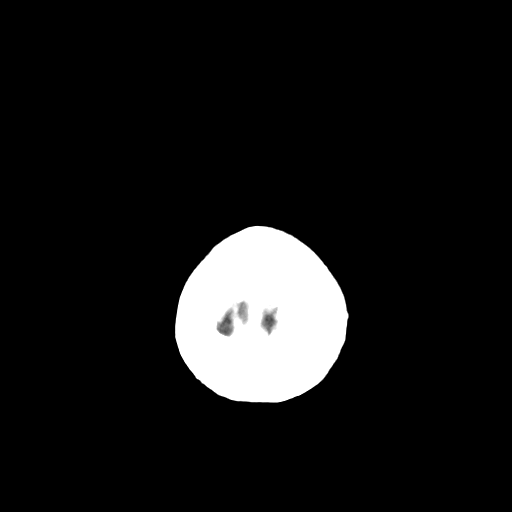
[im 29/32  brain]
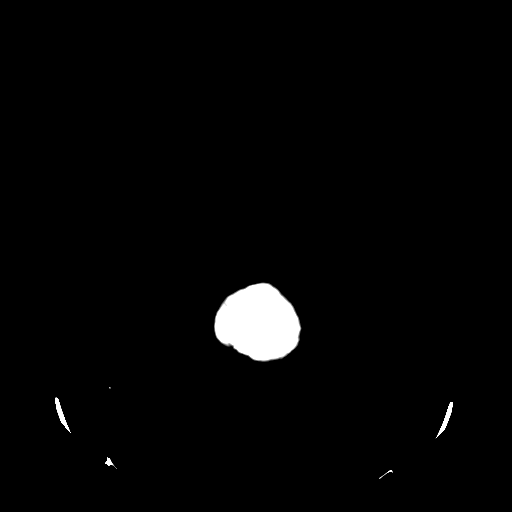
[im 29/32  bone]
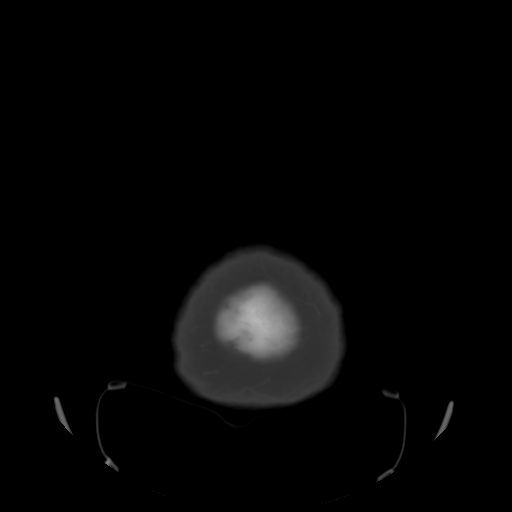

[13 of 30 positions shown; findings below may reference images not displayed]

FINDINGS: Atherosclerotic and physiologic intracranial
calcifications.  Early bilateral basal ganglia mineralization.
Stable bilateral frontal subdural hygromas. Diffuse parenchymal
atrophy. Patchy areas of hypoattenuation in deep and
periventricular white matter bilaterally. Negative for acute
intracranial hemorrhage, midline shift, or mass-effect. Acute
infarct may be inapparent on noncontrast CT. Ventricles and sulci
symmetric. Bone windows demonstrate no focal lesion. Retention cyst
or polyp in the right maxillary sinus incidentally noted.
IMPRESSION: 1. Negative for bleed or other acute intracranial process.

2. Atrophy and nonspecific white matter changes

## 2008-12-30 ENCOUNTER — Telehealth: Payer: Self-pay | Admitting: Family Medicine

## 2009-01-27 ENCOUNTER — Telehealth: Payer: Self-pay | Admitting: Family Medicine

## 2009-01-28 ENCOUNTER — Ambulatory Visit (HOSPITAL_COMMUNITY): Admission: RE | Admit: 2009-01-28 | Discharge: 2009-01-28 | Payer: Self-pay | Admitting: Orthopaedic Surgery

## 2009-02-02 ENCOUNTER — Telehealth: Payer: Self-pay | Admitting: Family Medicine

## 2009-02-11 ENCOUNTER — Ambulatory Visit: Payer: Self-pay | Admitting: Critical Care Medicine

## 2009-02-21 ENCOUNTER — Telehealth (INDEPENDENT_AMBULATORY_CARE_PROVIDER_SITE_OTHER): Payer: Self-pay | Admitting: *Deleted

## 2009-02-21 ENCOUNTER — Encounter: Payer: Self-pay | Admitting: Internal Medicine

## 2009-02-22 ENCOUNTER — Ambulatory Visit: Payer: Self-pay | Admitting: Internal Medicine

## 2009-03-08 ENCOUNTER — Telehealth: Payer: Self-pay | Admitting: Family Medicine

## 2009-03-17 ENCOUNTER — Encounter (INDEPENDENT_AMBULATORY_CARE_PROVIDER_SITE_OTHER): Payer: Self-pay | Admitting: *Deleted

## 2009-03-22 ENCOUNTER — Telehealth: Payer: Self-pay | Admitting: Family Medicine

## 2009-03-24 ENCOUNTER — Encounter: Payer: Self-pay | Admitting: Family Medicine

## 2009-03-24 DIAGNOSIS — K602 Anal fissure, unspecified: Secondary | ICD-10-CM | POA: Insufficient documentation

## 2009-03-25 ENCOUNTER — Ambulatory Visit: Payer: Self-pay | Admitting: Pulmonary Disease

## 2009-03-25 ENCOUNTER — Telehealth: Payer: Self-pay | Admitting: Family Medicine

## 2009-03-25 DIAGNOSIS — R0902 Hypoxemia: Secondary | ICD-10-CM | POA: Insufficient documentation

## 2009-05-24 ENCOUNTER — Ambulatory Visit: Payer: Self-pay | Admitting: Family Medicine

## 2009-05-24 DIAGNOSIS — E876 Hypokalemia: Secondary | ICD-10-CM

## 2009-05-24 HISTORY — DX: Hypokalemia: E87.6

## 2009-05-24 LAB — CONVERTED CEMR LAB
BUN: 8 mg/dL (ref 6–23)
CO2: 29 meq/L (ref 19–32)
Calcium: 8.7 mg/dL (ref 8.4–10.5)
Chloride: 105 meq/L (ref 96–112)
Creatinine, Ser: 0.6 mg/dL (ref 0.4–1.2)
Creatinine,U: 52.5 mg/dL
Free T4: 0.8 ng/dL (ref 0.6–1.6)
GFR calc non Af Amer: 103.23 mL/min (ref >60)
Glucose, Bld: 126 mg/dL — ABNORMAL HIGH (ref 70–99)
Microalb Creat Ratio: 7.6 mg/g (ref 0.0–30.0)
Microalb, Ur: 0.4 mg/dL (ref 0.0–1.9)
Potassium: 3.7 meq/L (ref 3.5–5.1)
Sodium: 143 meq/L (ref 135–145)
TSH: 2.09 microintl units/mL (ref 0.35–5.50)

## 2009-05-30 ENCOUNTER — Ambulatory Visit: Payer: Self-pay | Admitting: Family Medicine

## 2009-06-07 ENCOUNTER — Ambulatory Visit: Payer: Self-pay | Admitting: Internal Medicine

## 2009-06-27 ENCOUNTER — Telehealth: Payer: Self-pay | Admitting: Family Medicine

## 2009-07-04 ENCOUNTER — Telehealth: Payer: Self-pay | Admitting: Family Medicine

## 2009-07-13 ENCOUNTER — Ambulatory Visit: Payer: Self-pay | Admitting: Family Medicine

## 2009-07-27 ENCOUNTER — Telehealth: Payer: Self-pay | Admitting: Family Medicine

## 2009-08-18 ENCOUNTER — Telehealth: Payer: Self-pay | Admitting: Family Medicine

## 2009-08-25 ENCOUNTER — Ambulatory Visit: Payer: Self-pay | Admitting: Family Medicine

## 2009-08-25 LAB — CONVERTED CEMR LAB
BUN: 9 mg/dL (ref 6–23)
Basophils Absolute: 0 10*3/uL (ref 0.0–0.1)
Basophils Relative: 0.9 % (ref 0.0–3.0)
CO2: 33 meq/L — ABNORMAL HIGH (ref 19–32)
Calcium: 8.8 mg/dL (ref 8.4–10.5)
Chloride: 103 meq/L (ref 96–112)
Creatinine, Ser: 0.7 mg/dL (ref 0.4–1.2)
Eosinophils Absolute: 0.2 10*3/uL (ref 0.0–0.7)
Eosinophils Relative: 3.9 % (ref 0.0–5.0)
GFR calc non Af Amer: 86.35 mL/min (ref >60)
Glucose, Bld: 95 mg/dL (ref 70–99)
HCT: 37.9 % (ref 36.0–46.0)
Hemoglobin: 12.4 g/dL (ref 12.0–15.0)
Hgb A1c MFr Bld: 6.3 % (ref 4.6–6.5)
Lymphocytes Relative: 41.9 % (ref 12.0–46.0)
Lymphs Abs: 2.1 10*3/uL (ref 0.7–4.0)
MCHC: 32.6 g/dL (ref 30.0–36.0)
MCV: 94.9 fL (ref 78.0–100.0)
Monocytes Absolute: 0.5 10*3/uL (ref 0.1–1.0)
Monocytes Relative: 9.3 % (ref 3.0–12.0)
Neutro Abs: 2.3 10*3/uL (ref 1.4–7.7)
Neutrophils Relative %: 44 % (ref 43.0–77.0)
Platelets: 240 10*3/uL (ref 150.0–400.0)
Potassium: 3.4 meq/L — ABNORMAL LOW (ref 3.5–5.1)
RBC: 3.99 M/uL (ref 3.87–5.11)
RDW: 14.2 % (ref 11.5–14.6)
Sodium: 142 meq/L (ref 135–145)
WBC: 5.1 10*3/uL (ref 4.5–10.5)

## 2009-08-28 LAB — CONVERTED CEMR LAB: Vit D, 25-Hydroxy: 27 ng/mL — ABNORMAL LOW (ref 30–89)

## 2009-08-31 ENCOUNTER — Ambulatory Visit: Payer: Self-pay | Admitting: Critical Care Medicine

## 2009-08-31 ENCOUNTER — Ambulatory Visit: Payer: Self-pay | Admitting: Family Medicine

## 2009-09-07 ENCOUNTER — Ambulatory Visit: Payer: Self-pay | Admitting: Critical Care Medicine

## 2009-09-08 ENCOUNTER — Telehealth: Payer: Self-pay | Admitting: Family Medicine

## 2009-09-15 ENCOUNTER — Encounter: Payer: Self-pay | Admitting: Family Medicine

## 2009-10-03 ENCOUNTER — Ambulatory Visit: Payer: Self-pay | Admitting: Critical Care Medicine

## 2009-10-26 ENCOUNTER — Telehealth: Payer: Self-pay | Admitting: Family Medicine

## 2009-11-10 ENCOUNTER — Ambulatory Visit: Payer: Self-pay | Admitting: Critical Care Medicine

## 2009-12-06 ENCOUNTER — Ambulatory Visit: Payer: Self-pay | Admitting: Family Medicine

## 2009-12-17 ENCOUNTER — Emergency Department (HOSPITAL_COMMUNITY): Admission: EM | Admit: 2009-12-17 | Discharge: 2009-12-18 | Payer: Self-pay | Admitting: Emergency Medicine

## 2010-01-12 ENCOUNTER — Ambulatory Visit: Payer: Self-pay | Admitting: Family Medicine

## 2010-01-12 DIAGNOSIS — R32 Unspecified urinary incontinence: Secondary | ICD-10-CM | POA: Insufficient documentation

## 2010-02-07 ENCOUNTER — Encounter: Payer: Self-pay | Admitting: Family Medicine

## 2010-02-07 ENCOUNTER — Telehealth (INDEPENDENT_AMBULATORY_CARE_PROVIDER_SITE_OTHER): Payer: Self-pay | Admitting: *Deleted

## 2010-02-07 ENCOUNTER — Ambulatory Visit: Payer: Self-pay | Admitting: Family Medicine

## 2010-02-08 LAB — CONVERTED CEMR LAB
ALT: 13 units/L (ref 0–35)
AST: 30 units/L (ref 0–37)
Albumin: 4 g/dL (ref 3.5–5.2)
Alkaline Phosphatase: 49 units/L (ref 39–117)
BUN: 16 mg/dL (ref 6–23)
Bilirubin, Direct: 0.2 mg/dL (ref 0.0–0.3)
CO2: 33 meq/L — ABNORMAL HIGH (ref 19–32)
Calcium: 9.1 mg/dL (ref 8.4–10.5)
Chloride: 106 meq/L (ref 96–112)
Cholesterol: 154 mg/dL (ref 0–200)
Creatinine, Ser: 0.8 mg/dL (ref 0.4–1.2)
GFR calc non Af Amer: 79.64 mL/min (ref >60)
Glucose, Bld: 107 mg/dL — ABNORMAL HIGH (ref 70–99)
HDL: 44.5 mg/dL (ref >39.00)
LDL Cholesterol: 93 mg/dL (ref 0–99)
Potassium: 4.1 meq/L (ref 3.5–5.1)
Sodium: 146 meq/L — ABNORMAL HIGH (ref 135–145)
TSH: 1.38 microintl units/mL (ref 0.35–5.50)
Total Bilirubin: 0.7 mg/dL (ref 0.3–1.2)
Total CHOL/HDL Ratio: 3
Total Protein: 7.1 g/dL (ref 6.0–8.3)
Triglycerides: 83 mg/dL (ref 0.0–149.0)
VLDL: 16.6 mg/dL (ref 0.0–40.0)
Vit D, 25-Hydroxy: 37 ng/mL (ref 30–89)

## 2010-02-09 ENCOUNTER — Encounter (INDEPENDENT_AMBULATORY_CARE_PROVIDER_SITE_OTHER): Payer: Self-pay | Admitting: *Deleted

## 2010-02-14 ENCOUNTER — Ambulatory Visit: Payer: Self-pay | Admitting: Family Medicine

## 2010-02-15 ENCOUNTER — Telehealth: Payer: Self-pay | Admitting: Family Medicine

## 2010-02-15 ENCOUNTER — Encounter: Payer: Self-pay | Admitting: Family Medicine

## 2010-02-20 ENCOUNTER — Telehealth: Payer: Self-pay | Admitting: Family Medicine

## 2010-03-02 ENCOUNTER — Telehealth: Payer: Self-pay | Admitting: Family Medicine

## 2010-03-03 ENCOUNTER — Encounter: Payer: Self-pay | Admitting: Family Medicine

## 2010-03-08 ENCOUNTER — Ambulatory Visit: Payer: Self-pay | Admitting: Critical Care Medicine

## 2010-03-21 ENCOUNTER — Telehealth: Payer: Self-pay | Admitting: Family Medicine

## 2010-04-20 ENCOUNTER — Encounter: Payer: Self-pay | Admitting: Critical Care Medicine

## 2010-04-27 ENCOUNTER — Encounter: Payer: Self-pay | Admitting: Family Medicine

## 2010-05-01 ENCOUNTER — Telehealth: Payer: Self-pay | Admitting: Family Medicine

## 2010-05-05 ENCOUNTER — Telehealth: Payer: Self-pay | Admitting: Family Medicine

## 2010-05-24 ENCOUNTER — Encounter: Payer: Self-pay | Admitting: Critical Care Medicine

## 2010-05-29 ENCOUNTER — Telehealth (INDEPENDENT_AMBULATORY_CARE_PROVIDER_SITE_OTHER): Payer: Self-pay | Admitting: *Deleted

## 2010-06-08 ENCOUNTER — Encounter: Payer: Self-pay | Admitting: Critical Care Medicine

## 2010-06-15 ENCOUNTER — Encounter: Payer: Self-pay | Admitting: Critical Care Medicine

## 2010-06-16 ENCOUNTER — Encounter: Payer: Self-pay | Admitting: Critical Care Medicine

## 2010-07-04 ENCOUNTER — Telehealth: Payer: Self-pay | Admitting: Family Medicine

## 2010-07-07 ENCOUNTER — Telehealth: Payer: Self-pay | Admitting: Family Medicine

## 2010-07-24 ENCOUNTER — Encounter: Payer: Self-pay | Admitting: Critical Care Medicine

## 2010-07-26 ENCOUNTER — Ambulatory Visit
Admission: RE | Admit: 2010-07-26 | Discharge: 2010-07-26 | Payer: Self-pay | Source: Home / Self Care | Attending: Family Medicine | Admitting: Family Medicine

## 2010-07-29 ENCOUNTER — Encounter: Payer: Self-pay | Admitting: Neurosurgery

## 2010-08-01 ENCOUNTER — Ambulatory Visit
Admission: RE | Admit: 2010-08-01 | Discharge: 2010-08-01 | Payer: Self-pay | Source: Home / Self Care | Attending: Critical Care Medicine | Admitting: Critical Care Medicine

## 2010-08-04 ENCOUNTER — Telehealth: Payer: Self-pay | Admitting: Family Medicine

## 2010-08-08 ENCOUNTER — Telehealth: Payer: Self-pay | Admitting: Family Medicine

## 2010-08-10 ENCOUNTER — Telehealth: Payer: Self-pay | Admitting: Family Medicine

## 2010-08-10 NOTE — Letter (Signed)
Summary: CMN for Oxygen / [facility]  CMN for Oxygen / [facility]   Imported By: Lennie Odor 07/31/2010 11:47:48  _____________________________________________________________________  External Attachment:    Type:   Image     Comment:   External Document

## 2010-08-10 NOTE — Progress Notes (Signed)
Summary: refill request for oxycodone  Phone Note Refill Request Call back at Home Phone [phone] Message from:  Patient  Refills Requested: Medication #1:  oxycodone 10 mgs- take one tablet 3 times a day This is no longer on med list- removed on 09/08/09.  Please advise.  Initial call taken by: Lowella Petties CMA,  October 26, 2009 10:22 AM  Follow-up for Phone Call        Itm looks as best I can tell that she was taken off Oxycodone by Pulm.  It might be because narcotics tend  to depress respiratory drive. Does she have any explanation about that? Shaune Leeks MD  October 26, 2009 11:52 AM   Additional Follow-up for Phone Call Additional follow up Details #1::        Pt states she was not aware that she was to stop this, she says no one told her or discussed anything with her.  She says she has to have this and she doesnt want to have to beg for it.  She says she has oxycontin but it doesnt help.  She is asking if you will call her. Additional Follow-up by: Lowella Petties CMA,  October 26, 2009 1:11 PM    Additional Follow-up for Phone Call Additional follow up Details #2::    Advised pt script is ready for pick up.                  Lowella Petties CMA  October 26, 2009 2:18 PM   Prescriptions: OXYCODONE-ACETAMINOPHEN 5-325 MG TABS (OXYCODONE-ACETAMINOPHEN) 1 every 6 hours as needed pain  #90 x 0   Entered and Authorized by:   Shaune Leeks MD   Signed by:   Shaune Leeks MD on 10/26/2009   Method used:   Print then Give to Patient   RxID:   [id number]

## 2010-08-10 NOTE — Letter (Signed)
Summary: Nadara Eaton letter  [facility]  [address] [place], [place] [address]   Phone: [phone]  Fax: [phone]       02/09/2010 MRN: [id number]  EDLIN FORD [address] [place], [place]  [address]  Dear Ms. [facility] [place], and [place] announce the retirement of Arta Silence, M.D., from full-time practice at the [facility] office effective January 05, 2010 and his plans of returning part-time.  It is important to Dr. Hetty Ely and to our practice that you understand that [facility] has seven physicians in our office for your health care needs.  We will continue to offer the same exceptional care that you have today.    Dr. Hetty Ely has spoken to many of you about his plans for retirement and returning part-time in the fall.   We will continue to work with you through the transition to schedule appointments for you in the office and meet the high standards that [facility] is committed to.   Again, it is with great pleasure that we share the news that Dr. Hetty Ely will return to [facility] at [facility] in October of 2011 with a reduced schedule.    If you have any questions, or would like to request an appointment with one of our physicians, please call us at [phone] and press the option for Scheduling an appointment.  We take pleasure in providing you with excellent patient care and look forward to seeing you at your next office visit.  Our [facility] Physicians are:  Tillman Abide, M.D. Laurita Quint, M.D. Roxy Manns, M.D. Kerby Nora, M.D. Hannah Beat, M.D. Ruthe Mannan, M.D. We proudly welcomed Raechel Ache, M.D. and Eustaquio Boyden, M.D. to the practice in July/August 2011.  Sincerely,  [facility]

## 2010-08-10 NOTE — Assessment & Plan Note (Signed)
Summary: Pulmonary OV   Copy to:  Kelly Splinter Primary Provider/Referring Provider:  Dr. Hetty Ely  CC:  Follow up for surgery clearance - for lumbar fusion.  History of Present Illness: This is a 77 year old, white female with chronic obstructive lung disease and chronic bronchitic component.    Nov 10, 2009 9:45 AM Needs Lower  back surgery, has chronic pain in lower extremity.   Dr Channing Mutters wants to do the surgery.  The pt is at a stable baseline.  There is no new cough or wheeze.  There is no fever/chills/sweats.  Pt on stable copd meds. Pt denies any increase in rescue therapy over baseline, denies waking up needing it or having any early am or nocturnal exacerbations of coughing/wheezing/or dyspnea. Pt denies any increase in rescue therapy over baseline, denies waking up needing it or having any early am or nocturnal exacerbations of coughing/wheezing/or dyspnea.   Asthma History    Asthma Control Assessment:    Age range: 12+ years    Symptoms: 0-2 days/week    Nighttime Awakenings: 0-2/month    Interferes w/ normal activity: no limitations    SABA use (not for EIB): 0-2 days/week    ATAQ questionnaire: 0    Exacerbations requiring oral systemic steroids: 0-1/year    Asthma Control Assessment: Well Controlled   Preventive Screening-Counseling & Management  Alcohol-Tobacco     Smoking Status: quit > 6 months  Current Medications (verified): 1)  Levoxyl 112 Mcg Tabs (Levothyroxine Sodium) .... One Tab By Mouth Once Daily 2)  Vitamin D3 1000 Unit Caps (Cholecalciferol) .... Take 1 Capsule By Mouth Once A Day 3)  Vitamin B-12 1000 Mcg Tabs (Cyanocobalamin) .... Take 1 Tablet By Mouth Once A Day 4)  One Daily Multiple Vitamin   Tabs (Multiple Vitamin) .Marland Kitchen.. 1 By Mouth Daily 5)  Lasix 20 Mg Tabs (Furosemide) .... Take 1 Tablet By Mouth Once A Day 6)  Klor-Con 10 10 Meq Tbcr (Potassium Chloride) .... Take 1-2 Tablet By Mouth Once A Day 7)  Caltrate 600   Tabs  (Calcium Carbonate Tabs) .Marland Kitchen.. 1 By Mouth Daily 8)  Oxygen .Marland Kitchen.. 3 L/min Continuously 9)  Spiriva Handihaler 18 Mcg  Caps (Tiotropium Bromide Monohydrate) .... Inhale Contents of 1 Capsule Once A Day 10)  Advair Hfa 230-21 Mcg/act  Aero (Fluticasone-Salmeterol) .... Two Puffs Twice Daily Pt To Stop Serevent and Flovent 11)  Fluticasone Propionate 50 Mcg/act Susp (Fluticasone Propionate) .... 2 Puffs Each Nostril Qd and At Bedtime 12)  Ranitidine Hcl 300 Mg Tabs (Ranitidine Hcl) .Marland Kitchen.. 1 Tablet Twice A Day 13)  Lyrica 50 Mg Caps (Pregabalin) .... One Tab By Mouth Two Times A Day 14)  Cymbalta 60 Mg Cpep (Duloxetine Hcl) .... One Tab By Mouth Once Daily 15)  Vesicare 10 Mg Tabs (Solifenacin Succinate) .... Take 1 Tab By Mouth At Bedtime 16)  Ortho-Est 1.25 1.5 Mg Tabs (Estropipate) .... Take 1 Tab By Mouth At Bedtime 17)  Medroxyprogesterone Acetate 2.5 Mg Tabs (Medroxyprogesterone Acetate) .... Take 1 Tab By Mouth At Bedtime 18)  Simvastatin 80 Mg  Tabs (Simvastatin) .... One Tab By Mouth At Night 19)  Norvasc 2.5 Mg Tabs (Amlodipine Besylate) .... Take 1 Tablet By Mouth Once A Day 20)  Alprazolam 1 Mg Tbdp (Alprazolam) .... 1/2 Tablet Twice A Day As Needed For Stress Only 21)  Ambien 10 Mg Tabs (Zolpidem Tartrate) .... One Tab By Mouth At Night As Needed Insomnia 22)  Oxycodone-Acetaminophen 5-325 Mg Tabs (Oxycodone-Acetaminophen) .Marland KitchenMarland KitchenMarland Kitchen  1 Every 6 Hours As Needed Pain 23)  Oxygen .... Increase To 3 L/min As Needed Shortness of Breath W/ Activity 24)  Imitrex 50 Mg Tabs (Sumatriptan Succinate) .Marland Kitchen.. 1 At Onset of Headache 25)  Proair Hfa 108 (90 Base) Mcg/act  Aers (Albuterol Sulfate) .... 2 Puffs Every 4 Hours As Needed 26)  Epi Pen .... Use As Needed Severe Allergic Reaction 27)  Allergy Vaccine 1:10 Go (W-E) .Marland Kitchen.. 1 Injection Once Weekly  Allergies (verified): 1)  ! Sulfadiazine (Sulfadiazine) 2)  ! Oxycontin  Past History:  Past medical, surgical, family and social histories (including risk  factors) reviewed, and no changes noted (except as noted below).  Past Medical History: Reviewed history from 10/03/2009 and no changes required. OSTEOPOROSIS (ICD-733.00) BRONCHITIS-ACUTE (ICD-466.0) ALLERGIC RHINITIS (ICD-477.9) SCREENING FOR MALIGNANNT NEOPLASM, SITE NEC (ICD-V76.49) PAIN, CHRONIC NEC (ICD-338.29) HYPERGLYCEMIA (ICD-790.6) INSOMNIA (ICD-780.52) ASTHMATIC BRONCHITIS, WITH COPD (ICD-466.0) POSTMENOPAUSAL ON HORMONE REPLACEMENT THERAPY (ICD-V07.4) DEGENERATIVE DISC DISEASE (ICD-722.6) REACTIVE AIRWAY DISEASE (ICD-493.90) ALLERGY, ENVIRONMENTAL (ICD-995.3) COMMON MIGRAINE (ICD-346.10) FIBROMYALGIA (ICD-729.1) HYPERTENSION (ICD-401.9) HYPERLIPIDEMIA (ICD-272.4) GERD (ICD-530.81) DIVERTICULOSIS, COLON (ICD-562.10) HYPOTHYROIDISM NOS (ICD-244.9) Hypertension  Past Surgical History: Reviewed history from 05/30/2009 and no changes required.                      NSVD X2  (Daughters) 1972             L Breast Cystectomy 1986             Back Surgery  DDD  Spinal Fusion 11/02/02         Colonoscopy  int. hemorrhoids, divertics    2014 05/09/04          Persantine Cardiolite - low risk study EF 76% 5/17-5/18/06 Chest pain VQ low prob, large fibroid 11/22/04          CT chest V/Q scan nml. 07/23/05          Persantine Cardiolite - inf. wall thinning (-) ischemia EF 77% 09/14/05            Right shoulder replacement ([facility]) 5/17-5/18/07 [facility] R/O'd 01/16/07         MRI  Multi lvl Arthropathy Enlarged Uterus 05/29/07       DEXA Osteoporotic Fem Neck L/S 10/08/07           Dobutamine Myoview nml  11/03/07         Arthroscopy L Knee Partial Medial Meniscectomy with MCL Sprain ( [facility]) 03/10/08           Head CT Atrophy 03/11/08           Head MRI Remote Lacunar Infarcts, Subcortical white matter disease   No acute infarct 9/1-03/11/08     HOSP Atered Ment Status from dementia and polypharmacy   [facility] 03/09/08           MRI/MRA No acute infarct, some atrophy mild-mod  atherosclerotic change 03/10/08          CT Head no acute changes 03/10/08          Echocardiogram  EF 60% No Emboli 9/3-03/17/08    HOSP Gait D/O w/TIA (Dr Sharene Skeans)  Vertell Limber Lovenox for DVT Prophyl Early Dementia (Dr Jeanie Sewer) 10/2-10/3/09HOSP Altered Mental Status due to UTI Anxiety Depression Fibromyalgia  Family History: Reviewed history from 05/30/2009 and no changes required. Father: Died 79 MI, HTN Mother: Died 38 CHF, RAD, HTN Siblings: 1 brother died lung CA HTN, 1 brother died lobectomy, HTN, pneumonia 1  sister alive 22  lymphoma (non-Hodgkin's) breast CA, HTN 1 sister died 75 asthma (Staph pneumonia) HTN CV:  (+) father who died of MI, mother died CHF HBP:  (+) mother/father, all sibs CA:  lung CA brothers x 2, sister (+lymphoma) Stroke:  (-)  Social History: Reviewed history from 12/24/2006 and no changes required. Marital Status: Married, lives with husband (retired school principal) Children: 2 married, 4 grandsons Occupation: Housewife Former Smoker, 1-1/2 PPD x 20 years - 47 PYH, quit 15 or more years ago  ~87 Alcohol use-no Drug use-no  Review of Systems       The patient complains of shortness of breath with activity.  The patient denies shortness of breath at rest, productive cough, non-productive cough, coughing up blood, chest pain, irregular heartbeats, acid heartburn, indigestion, loss of appetite, weight change, abdominal pain, difficulty swallowing, sore throat, tooth/dental problems, headaches, nasal congestion/difficulty breathing through nose, sneezing, itching, ear ache, anxiety, depression, hand/feet swelling, joint stiffness or pain, rash, change in color of mucus, and fever.    Vital Signs:  Patient profile:   77 year old female Height:      61 inches Weight:      216 pounds BMI:     40.96 O2 Sat:      96 % on 3 L/min pulsing Temp:     98.4 degrees F oral Pulse rate:   69 / minute BP sitting:   116 / 64  (right arm) Cuff size:   large  Vitals  Entered By: Gweneth Dimitri RN (Nov 10, 2009 9:25 AM)  O2 Flow:  3 L/min pulsing CC: Follow up for surgery clearance - for lumbar fusion Is Patient Diabetic? No Comments Medications reviewed with patient Daytime contact number verified with patient. Gweneth Dimitri RN  Nov 10, 2009 9:26 AM    Physical Exam  Additional Exam:  General: A/Ox3; pleasant and cooperative, NAD,supplemental oxygen , pleasant lady SKIN: no rash, lesions NODES: no lymphadenopathy HEENT: [place]/AT, EOM- WNL, Conjuctivae- clear, PERRLA, TM-WNL, Nose- clear, not overdried, Throat- clear and wnl NECK: Supple w/ fair ROM, JVD- none, normal carotid impulses w/o bruits Thyroid- normal to palpation CHEST: coarse BS w/ no wheezing.  HEART: RRR, no m/g/r heard ABDOMEN: Soft and nl;  BJY:NWGN, nl pulses, no edema  NEURO: Grossly intact to observation      Impression & Recommendations:  Problem # 1:  ASTHMATIC BRONCHITIS, WITH COPD (ICD-466.0) Assessment Improved Stable asthmatic bronchitis and copd the pt is stable and can be cleared for planned lumbar surgery plan No change in inhaled medications.   Maintain treatment program as currently prescribed.  Her updated medication list for this problem includes:    Spiriva Handihaler 18 Mcg Caps (Tiotropium bromide monohydrate) ..... Inhale contents of 1 capsule once a day    Advair Hfa 230-21 Mcg/act Aero (Fluticasone-salmeterol) .Marland Kitchen..Marland Kitchen Two puffs twice daily pt to stop serevent and flovent    Proair Hfa 108 (90 Base) Mcg/act Aers (Albuterol sulfate) .Marland Kitchen... 2 puffs every 4 hours as needed  Medications Added to Medication List This Visit: 1)  Klor-con 10 10 Meq Tbcr (Potassium chloride) .... Take 1-2 tablet by mouth once a day  Complete Medication List: 1)  Levoxyl 112 Mcg Tabs (Levothyroxine sodium) .... One tab by mouth once daily 2)  Vitamin D3 1000 Unit Caps (Cholecalciferol) .... Take 1 capsule by mouth once a day 3)  Vitamin B-12 1000 Mcg Tabs (Cyanocobalamin) ....  Take 1 tablet by mouth once a day 4)  One Daily Multiple Vitamin Tabs (Multiple vitamin) .Marland Kitchen.. 1 by mouth daily 5)  Lasix 20 Mg Tabs (Furosemide) .... Take 1 tablet by mouth once a day 6)  Klor-con 10 10 Meq Tbcr (Potassium chloride) .... Take 1-2 tablet by mouth once a day 7)  Caltrate 600 Tabs (Calcium carbonate tabs) .Marland Kitchen.. 1 by mouth daily 8)  Oxygen  .Marland Kitchen.. 3 l/min continuously 9)  Spiriva Handihaler 18 Mcg Caps (Tiotropium bromide monohydrate) .... Inhale contents of 1 capsule once a day 10)  Advair Hfa 230-21 Mcg/act Aero (Fluticasone-salmeterol) .... Two puffs twice daily pt to stop serevent and flovent 11)  Fluticasone Propionate 50 Mcg/act Susp (Fluticasone propionate) .... 2 puffs each nostril qd and at bedtime 12)  Ranitidine Hcl 300 Mg Tabs (Ranitidine hcl) .Marland Kitchen.. 1 tablet twice a day 13)  Lyrica 50 Mg Caps (Pregabalin) .... One tab by mouth two times a day 14)  Cymbalta 60 Mg Cpep (Duloxetine hcl) .... One tab by mouth once daily 15)  Vesicare 10 Mg Tabs (Solifenacin succinate) .... Take 1 tab by mouth at bedtime 16)  Ortho-est 1.25 1.5 Mg Tabs (Estropipate) .... Take 1 tab by mouth at bedtime 17)  Medroxyprogesterone Acetate 2.5 Mg Tabs (Medroxyprogesterone acetate) .... Take 1 tab by mouth at bedtime 18)  Simvastatin 80 Mg Tabs (Simvastatin) .... One tab by mouth at night 19)  Norvasc 2.5 Mg Tabs (Amlodipine besylate) .... Take 1 tablet by mouth once a day 20)  Alprazolam 1 Mg Tbdp (Alprazolam) .... 1/2 tablet twice a day as needed for stress only 21)  Ambien 10 Mg Tabs (Zolpidem tartrate) .... One tab by mouth at night as needed insomnia 22)  Oxycodone-acetaminophen 5-325 Mg Tabs (Oxycodone-acetaminophen) .Marland Kitchen.. 1 every 6 hours as needed pain 23)  Oxygen  .... Increase to 3 l/min as needed shortness of breath w/ activity 24)  Imitrex 50 Mg Tabs (Sumatriptan succinate) .Marland Kitchen.. 1 at onset of headache 25)  Proair Hfa 108 (90 Base) Mcg/act Aers (Albuterol sulfate) .... 2 puffs every 4 hours as  needed 26)  Epi Pen  .... Use as needed severe allergic reaction 27)  Allergy Vaccine 1:10 Go (w-e)  .Marland Kitchen.. 1 injection once weekly  Other Orders: Est. Patient Level III ([address])  Patient Instructions: 1)  I will clear you for surgery 2)  No change in medications 3)  Return [facility] office 3 months  Appended Document: Pulmonary OV fax to [employer] [facility]

## 2010-08-10 NOTE — Progress Notes (Signed)
Summary: refill request for percocet  Phone Note Refill Request Call back at Home Phone [phone] Message from:  Patient  Refills Requested: Medication #1:  PERCOCET 10-325 MG TABS 1 by mouth three to four times a day for pain Pt is requesting refill.  She said that she is taking 7.5/325 becausee the 10 mg is too strong, and that this was changed in july.  Please call when ready.  Initial call taken by: Lowella Petties CMA, AAMA,  May 05, 2010 11:05 AM  Follow-up for Phone Call        signed.  Follow-up by: Crawford Givens MD,  May 05, 2010 1:36 PM    New/Updated Medications: PERCOCET 7.5-325 MG TABS (OXYCODONE-ACETAMINOPHEN) 1 by mouth 3-4 times a day for pain Prescriptions: PERCOCET 7.5-325 MG TABS (OXYCODONE-ACETAMINOPHEN) 1 by mouth 3-4 times a day for pain  #100 x 0   Entered and Authorized by:   Crawford Givens MD   Signed by:   Crawford Givens MD on 05/05/2010   Method used:   Print then Give to Patient   RxID:   [id number]

## 2010-08-10 NOTE — Progress Notes (Signed)
   Phone Note From Pharmacy   Caller: [facility] AT [phone] Summary of Call: RX SIGNED BY DR Nakema Fake FOR LYRICA 50 MG #68 WITH as needed REFILLS // AND FAXED TO [facility].

## 2010-08-10 NOTE — Medication Information (Signed)
Summary: Drug Interaction Review  Drug Interaction Review   Imported By: Maryln Gottron 02/21/2010 10:51:50  _____________________________________________________________________  External Attachment:    Type:   Image     Comment:   External Document

## 2010-08-10 NOTE — Miscellaneous (Signed)
Summary: OV Charge   Clinical Lists Changes  Orders: Added new Service order of Est. Patient Level III ([address]) - Signed

## 2010-08-10 NOTE — Miscellaneous (Signed)
Summary: Flu vaccine   Clinical Lists Changes  Observations: Added new observation of FLU VAX: Historical (04/21/2010 11:48)      Influenza Immunization History:    Influenza # 1:  Historical (04/21/2010) Received form from [facility], [address] [employer]

## 2010-08-10 NOTE — Progress Notes (Signed)
Summary: refill request for lyrica  Phone Note Refill Request Message from:  Patient  Refills Requested: Medication #1:  LYRICA 50 MG CAPS one tab by mouth two times a day Pt requests a 90 day script to be sent to [facility].  Initial call taken by: Lowella Petties CMA, AAMA,  August 04, 2010 8:07 AM  Follow-up for Phone Call        please fax to pharmacy.   Follow-up by: Crawford Givens MD,  August 04, 2010 9:29 AM  Additional Follow-up for Phone Call Additional follow up Details #1::        Faxed. Additional Follow-up by: Delilah Shan CMA (AAMA),  August 04, 2010 11:15 AM    Prescriptions: LYRICA 50 MG CAPS (PREGABALIN) one tab by mouth two times a day  #180 x 1   Entered and Authorized by:   Crawford Givens MD   Signed by:   Crawford Givens MD on 08/04/2010   Method used:   Printed then faxed to ...       [facility]* (retail)             ,          Ph: [phone]       Fax: [phone]   RxID:   [id number]

## 2010-08-10 NOTE — Progress Notes (Signed)
Summary: Rx Oxycodone  Phone Note Call from Patient   Caller: Patient Call For: Crawford Givens MD Summary of Call: Patient called and is requesting a refill for Oxycodone/APAP 10-325.  This medication is not on the list and it was removed on 02/11/2009.  Please advise. Initial call taken by: Linde Gillis CMA Duncan Dull),  July 04, 2010 2:05 PM  Follow-up for Phone Call        We changed her back to the 7.5mg  dose.  please print off for me to sign with her other med.  thanks.  Follow-up by: Crawford Givens MD,  July 05, 2010 12:53 PM  Additional Follow-up for Phone Call Additional follow up Details #1::        Patient notified as instructed via telephone rx ready for pick up will be left at front desk. Additional Follow-up by: Linde Gillis CMA Duncan Dull),  July 05, 2010 4:21 PM    Prescriptions: PERCOCET 7.5-325 MG TABS (OXYCODONE-ACETAMINOPHEN) 1 by mouth 3-4 times a day for pain  #100 x 0   Entered by:   Linde Gillis CMA (AAMA)   Authorized by:   Crawford Givens MD   Signed by:   Linde Gillis CMA (AAMA) on 07/05/2010   Method used:   Print then Give to Patient   RxID:   [id number] PERCOCET 7.5-325 MG TABS (OXYCODONE-ACETAMINOPHEN) 1 by mouth 3-4 times a day for pain  #100 x 0   Entered and Authorized by:   Crawford Givens MD   Signed by:   Crawford Givens MD on 07/05/2010   Method used:   Print then Give to Patient   RxID:   [id number]

## 2010-08-10 NOTE — Miscellaneous (Signed)
Summary: Controlled Substance Contract  Controlled Substance Contract   Imported By: Esmeralda Links D'jimraou 01/17/2010 15:39:53  _____________________________________________________________________  External Attachment:    Type:   Image     Comment:   External Document

## 2010-08-10 NOTE — Medication Information (Signed)
Summary: Tax adviser   Imported By: Valinda Hoar 06/08/2010 16:44:15  _____________________________________________________________________  External Attachment:    Type:   Image     Comment:   External Document

## 2010-08-10 NOTE — Letter (Signed)
Summary: CMN for Oximetry/[facility]  CMN for Oximetry/[facility]   Imported By: Sherian Rein 04/26/2010 15:14:11  _____________________________________________________________________  External Attachment:    Type:   Image     Comment:   External Document

## 2010-08-10 NOTE — Letter (Signed)
Summary: Dr.Mark Roy,[facility],Note  Dr.Mark Roy,[facility],Note   Imported By: Beau Fanny 09/20/2009 16:39:42  _____________________________________________________________________  External Attachment:    Type:   Image     Comment:   External Document

## 2010-08-10 NOTE — Progress Notes (Signed)
Summary: refill request for [name]  Phone Note Refill Request Call back at [facility] Phone [phone] Message from:  Patient  Refills Requested: Medication #1:  LYRICA 50 MG CAPS one tab by mouth two times a day  Medication #2:  AMBIEN 10 MG TABS one tab by mouth at night as needed insomnia Please send one month of each to [facility] [address].  Initial call taken by: Lowella Petties CMA,  February 20, 2010 3:13 PM  Follow-up for Phone Call        please call in.  Follow-up by: Crawford Givens MD,  February 20, 2010 8:15 PM    Prescriptions: AMBIEN 10 MG TABS (ZOLPIDEM TARTRATE) one tab by mouth at night as needed insomnia  #30 x 3   Entered and Authorized by:   Crawford Givens MD   Signed by:   Crawford Givens MD on 02/20/2010   Method used:   [facility]phoned to ...       [facility]  [address] [phone]* (retail)       [address]       [place], [place]  [id number]       Ph: [phone] or [phone]       Fax: [phone]   RxID:   [id number] LYRICA 50 MG CAPS (PREGABALIN) one tab by mouth two times a day  #60 x 3   Entered and Authorized by:   Crawford Givens MD   Signed by:   Crawford Givens MD on 02/20/2010   Method used:   Telephoned to ...       [facility]  [address] [phone]* (retail)       [address]       [place], [place]  [id number]       Ph: [phone] or [phone]       Fax: [phone]   RxID:   [id number]   Appended Document: refill request for [name] Medication phoned to pharmacy.

## 2010-08-10 NOTE — Assessment & Plan Note (Signed)
Summary: TRANSFER FROM DR SCHALLER/INCONTINANCE   Vital Signs:  Patient profile:   77 year old female Height:      61 inches Weight:      219 pounds O2 Sat:      94 % on 3 L/min Temp:     98.1 degrees F oral Pulse rate:   80 / minute Pulse rhythm:   regular BP sitting:   132 / 68  (left arm) Cuff size:   large  Vitals Entered By: Delilah Shan CMA (AAMA) (January 12, 2010 3:27 PM)  O2 Flow:  3 L/min CC: Transfer from RNS - Incontinence   History of Present Illness: "Overactive bladder."  Prev was on vesicare w/o relief.  Having to wear pads due to incontinence. + urinary urgency.  No FCNAV.  No burning with urination.  + h/o stress incontinence.   DDD and back pain.  Longstanding issue for patient and chronically using opiates responsibly.   Good relief with medication and tolerating well.  No constipation.  Using stool softners and 1 colace.  No change in overall symtpoms.  Pain in lower back.  Initial surgery was >20 years ago.  Uses walker.  Pain radiates down into legs.  Known DDD.  Has declined surgery for now.  med contract done today.   Problems Prior to Update: 1)  Unspecified Urinary Incontinence  (ICD-788.30) 2)  Hypopotassemia  (ICD-276.8) 3)  Anal Fissure  (ICD-565.0) 4)  Hypoxemia  (ICD-799.02) 5)  Urinary Tract Infection, Acute, Recurrent  (ICD-599.0) 6)  Anemia, Mild  (ICD-285.9) 7)  Anxiety Disorder  (ICD-300.00) 8)  Preoperative Examination  (ICD-V72.84) 9)  Sinusitis, Acute Maxillary  (ICD-461.0) 10)  Osteoporosis  (ICD-733.00) 11)  Allergic Rhinitis  (ICD-477.9) 12)  Screening For Malignannt Neoplasm, Site Nec  (ICD-V76.49) 13)  Pain, Chronic Nec  (ICD-338.29) 14)  Hyperglycemia  (ICD-790.6) 15)  Insomnia  (ICD-780.52) 16)  Asthmatic Bronchitis, With COPD  (ICD-466.0) 17)  Postmenopausal On Hormone Replacement Therapy  (ICD-V07.4) 18)  Degenerative Disc Disease  (ICD-722.6) 19)  Reactive Airway Disease  (ICD-493.90) 20)  Allergy, Environmental   (ICD-995.3) 21)  Common Migraine  (ICD-346.10) 22)  Fibromyalgia  (ICD-729.1) 23)  Hypertension  (ICD-401.9) 24)  Hyperlipidemia  (ICD-272.4) 25)  Gerd  (ICD-530.81) 26)  Diverticulosis, Colon  (ICD-562.10) 27)  Hypothyroidism Nos  (ICD-244.9)  Allergies: 1)  ! Sulfadiazine (Sulfadiazine) 2)  ! Oxycontin  Review of Systems       See HPI.  Otherwise noncontributory.    Physical Exam  General:  GEN: nad, alert and oriented, sore from back pain HEENT: mucous membranes moist NECK: supple w/o LA CV: rrr PULM: ctab, no inc wob ABD: soft, +bs EXT: no edema Back: lumbar spine tender to palpation in midline and in paraspinal area which is at baseline per patient.  no rash.  Pain with flexion and rotation.  Distally sensation intact for bilateral lower extremities.    Impression & Recommendations:  Problem # 1:  DEGENERATIVE DISC DISEASE (ICD-722.6) Continue current meds.  med contract done.  No further intervention/ work up now.  She agrees with plan.  On bowel regimen.  Good relief with meds.   Problem # 2:  UNSPECIFIED URINARY INCONTINENCE (ICD-788.30) patient prev w/o good effect from vesicare.  Would like to avoid other meds at this point.  Would have patient attempt kegels and if not improved will have patient follow up with uro.  She agrees.  Will call back as needed.   Complete Medication List:  1)  Levoxyl 112 Mcg Tabs (Levothyroxine sodium) .... One tab by mouth once daily 2)  Vitamin D3 1000 Unit Caps (Cholecalciferol) .... Take 1 capsule by mouth once a day 3)  Vitamin B-12 1000 Mcg Tabs (Cyanocobalamin) .... Take 1 tablet by mouth once a day 4)  One Daily Multiple Vitamin Tabs (Multiple vitamin) .Marland Kitchen.. 1 by mouth daily 5)  Lasix 20 Mg Tabs (Furosemide) .... Take 1 tablet by mouth once a day 6)  Klor-con 10 10 Meq Tbcr (Potassium chloride) .... Take 1-2 tablet by mouth once a day 7)  Caltrate 600 Tabs (Calcium carbonate tabs) .Marland Kitchen.. 1 by mouth daily 8)  Oxygen  .Marland Kitchen.. 3 l/min  continuously 9)  Spiriva Handihaler 18 Mcg Caps (Tiotropium bromide monohydrate) .... Inhale contents of 1 capsule once a day 10)  Advair Hfa 230-21 Mcg/act Aero (Fluticasone-salmeterol) .... Two puffs twice daily pt to stop serevent and flovent 11)  Fluticasone Propionate 50 Mcg/act Susp (Fluticasone propionate) .... 2 puffs each nostril qd and at bedtime 12)  Ranitidine Hcl 300 Mg Tabs (Ranitidine hcl) .Marland Kitchen.. 1 tablet twice a day 13)  Lyrica 50 Mg Caps (Pregabalin) .... One tab by mouth two times a day 14)  Cymbalta 60 Mg Cpep (Duloxetine hcl) .... One tab by mouth once daily 15)  Ortho-est 1.25 1.5 Mg Tabs (Estropipate) .... Take 1 tab by mouth at bedtime 16)  Medroxyprogesterone Acetate 2.5 Mg Tabs (Medroxyprogesterone acetate) .... Take 1 tab by mouth at bedtime 17)  Simvastatin 80 Mg Tabs (Simvastatin) .... One tab by mouth at night 18)  Norvasc 2.5 Mg Tabs (Amlodipine besylate) .... Take 1 tablet by mouth once a day 19)  Alprazolam 1 Mg Tbdp (Alprazolam) .... 1/2 tablet twice a day as needed for stress only 20)  Ambien 10 Mg Tabs (Zolpidem tartrate) .... One tab by mouth at night as needed insomnia 21)  Oxycodone-acetaminophen 7.5-325 Mg Tabs (Oxycodone-acetaminophen) .... One tab by mouth every 6 hrs as needed for pain. 22)  Oxygen  .... Increase to 3 l/min as needed shortness of breath w/ activity 23)  Imitrex 50 Mg Tabs (Sumatriptan succinate) .Marland Kitchen.. 1 at onset of headache 24)  Proair Hfa 108 (90 Base) Mcg/act Aers (Albuterol sulfate) .... 2 puffs every 4 hours as needed 25)  Epi Pen  .... Use as needed severe allergic reaction 26)  Allergy Vaccine 1:10 Go (w-e)  .Marland Kitchen.. 1 injection once weekly  Patient Instructions: 1)  Please schedule a follow-up appointment in 1 month.  2)  Use the exercises for your lower abdomen and let me know if you aren't doing better.  We can get you set up with the urology clinic if needed.  Prescriptions: OXYCODONE-ACETAMINOPHEN 7.5-325 MG TABS  (OXYCODONE-ACETAMINOPHEN) one tab by mouth every 6 hrs as needed for pain.  #120 x 0   Entered and Authorized by:   Crawford Givens MD   Signed by:   Crawford Givens MD on 01/12/2010   Method used:   Print then Give to Patient   RxID:   [id number]   Current Allergies (reviewed today): ! SULFADIAZINE (SULFADIAZINE) ! OXYCONTIN

## 2010-08-10 NOTE — Progress Notes (Signed)
Summary: Omeprazole approved 05/15/2010 - 06/05/2011  Phone Note Outgoing Call   Call placed by: Gweneth Dimitri RN,  May 29, 2010 12:09 PM Summary of Call: Received Prior Auth request for omeprazole 20mg  [facility] 1 capsule by [facility] a [facility] [address] Called [facility] at [phone] to initiate PA.  Awaiting fax to triage. Case # [id number] Initial call taken by: Gweneth Dimitri RN,  May 29, 2010 12:11 PM  Follow-up for Phone Call        Fax was placed in PW lookat to be completed. Vernie Murders  May 29, 2010 5:19 PM  form faxed back to [facility] and placed in triage with the awaiting approval/denial forms. Gweneth Dimitri RN  June 05, 2010 5:03 PM   Additional Follow-up for Phone Call Additional follow up Details #1::        Receievd approval letter from [facility] for omeprazole from 05/15/2010 until 06/05/2011.  Will have letter scanned into EMR.      Additional Follow-up for Phone Call Additional follow up Details #2::    Sp[facility] with pt's [facility] and [facility]  They are both aware omeprazole approved for dates above and verbalized understanding. Follow-up by: Gweneth Dimitri RN,  June 08, 2010 3:13 PM

## 2010-08-10 NOTE — Progress Notes (Signed)
Summary: Cymbalta  Phone Note Refill Request Message from:  Scriptline on May 01, 2010 2:30 PM  Refills Requested: Medication #1:  CYMBALTA 60 MG CPEP one tab by mouth once daily [facility]*   Last Fill Date:  No date sent   Pharmacy Phone:  [phone]   Method Requested: Electronic Initial call taken by: Delilah Shan CMA Janiece Scovill Dull),  May 01, 2010 2:30 PM  Follow-up for Phone Call        done.  Follow-up by: Crawford Givens MD,  May 01, 2010 11:52 PM    Prescriptions: CYMBALTA 60 MG CPEP (DULOXETINE HCL) one tab by mouth once daily  #90 x 3   Entered and Authorized by:   Crawford Givens MD   Signed by:   Crawford Givens MD on 05/01/2010   Method used:   Faxed to ...       [facility] (mail-order)             , [place]         Ph: [phone]       Fax: [phone]   RxID:   [id number]

## 2010-08-10 NOTE — Medication Information (Signed)
Summary: Prior Authorization & Approval for Additional Quantity Zolpidem   Prior Authorization & Approval for Additional Quantity Zolpidem Tartrate/[facility]   Imported By: Lanelle Bal 03/14/2010 08:40:18  _____________________________________________________________________  External Attachment:    Type:   Image     Comment:   External Document

## 2010-08-10 NOTE — Progress Notes (Signed)
Summary: form regarding drug interaction  Phone Note From Pharmacy   Caller: [facility] Summary of Call: Form regarding drug interaction between amlodipine and zocor is on your desk. Initial call taken by: Lowella Petties CMA,  February 15, 2010 2:38 PM  Follow-up for Phone Call        signed.  Follow-up by: Crawford Givens MD,  February 15, 2010 3:07 PM  Additional Follow-up for Phone Call Additional follow up Details #1::        Faxed Additional Follow-up by: Delilah Shan CMA [facility]),  February 15, 2010 4:30 PM

## 2010-08-10 NOTE — Assessment & Plan Note (Signed)
Summary: Pulmonary OV   Copy to:  Kelly Splinter Primary Provider/Referring Provider:  Crawford Givens [facility]  CC:  Follow up.  Pt states she does have SOB when over exerting self and occ wheezing but this is unchanged from last OV.  does have a cough at night - rarely prod with creamy mucus.  .  History of Present Illness: This is a 77 year old, white female with chronic obstructive lung disease and chronic bronchitic component.      March 08, 2010 12:27 PM Doing ok since may 2011.  Larey Seat once and hurt side of face.  Held off on back surgery. May see a new Ortho MD. No new issues. Pt denies any significant sore throat, nasal congestion or excess secretions, fever, chills, sweats, unintended weight loss, pleurtic or exertional chest pain, orthopnea PND, or leg swelling Pt denies any increase in rescue therapy over baseline, denies waking up needing it or having any early am or nocturnal exacerbations of coughing/wheezing/or dyspnea.   Asthma History    Asthma Control Assessment:    Age range: 12+ years    Symptoms: 0-2 days/week    Nighttime Awakenings: 0-2/month    Interferes w/ normal activity: no limitations    SABA use (not for EIB): 0-2 days/week    ATAQ questionnaire: 0    Exacerbations requiring oral systemic steroids: 0-1/year    Asthma Control Assessment: Well Controlled   Preventive Screening-Counseling & Management  Alcohol-Tobacco     Alcohol drinks/day: 0     Smoking Status: quit > 6 months     Year Quit: 1987     Pack years: 35     Passive Smoke Exposure: no  Current Medications (verified): 1)  Levoxyl 112 Mcg Tabs (Levothyroxine Sodium) .... One Tab By Mouth Once Daily 2)  Vitamin D3 1000 Unit Caps (Cholecalciferol) .... Take 1 Capsule By Mouth Once A Day 3)  Vitamin B-12 1000 Mcg Tabs (Cyanocobalamin) .... Take 1 Tablet By Mouth Once A Day 4)  One Daily Multiple Vitamin   Tabs (Multiple Vitamin) .Marland Kitchen.. 1 By Mouth Daily 5)  Lasix 20 Mg Tabs  (Furosemide) .... Take 1 Tablet By Mouth Once A Day 6)  Klor-Con 10 10 Meq Tbcr (Potassium Chloride) .... Take 1-2 Tablet By Mouth Once A Day 7)  Caltrate 600   Tabs (Calcium Carbonate Tabs) .Marland Kitchen.. 1 By Mouth Daily 8)  Oxygen .Marland Kitchen.. 3 L/min Continuously 9)  Spiriva Handihaler 18 Mcg  Caps (Tiotropium Bromide Monohydrate) .... Inhale Contents of 1 Capsule Once A Day 10)  Advair Hfa 230-21 Mcg/act  Aero (Fluticasone-Salmeterol) .... Two Puffs Twice Daily Pt To Stop Serevent and Flovent 11)  Fluticasone Propionate 50 Mcg/act Susp (Fluticasone Propionate) .... 2 Puffs Each Nostril Qd and At Bedtime 12)  Ranitidine Hcl 300 Mg Tabs (Ranitidine Hcl) .Marland Kitchen.. 1 Tablet Twice A Day 13)  Lyrica 50 Mg Caps (Pregabalin) .... One Tab By Mouth Two Times A Day 14)  Cymbalta 60 Mg Cpep (Duloxetine Hcl) .... One Tab By Mouth Once Daily 15)  Ortho-Est 1.25 1.5 Mg Tabs (Estropipate) .... Take 1 Tab By Mouth At Bedtime 16)  Medroxyprogesterone Acetate 2.5 Mg Tabs (Medroxyprogesterone Acetate) .... Take 1 Tab By Mouth At Bedtime 17)  Simvastatin 80 Mg  Tabs (Simvastatin) .... One Tab By Mouth At Night 18)  Norvasc 2.5 Mg Tabs (Amlodipine Besylate) .... Take 1 Tablet By Mouth Once A Day 19)  Alprazolam 1 Mg Tbdp (Alprazolam) .... 1/2 Tablet Twice A Day As Needed For Stress  Only 20)  Ambien 10 Mg Tabs (Zolpidem Tartrate) .... One Tab By Mouth At Night As Needed Insomnia 21)  Oxygen .... Increase To 3 L/min As Needed Shortness of Breath W/ Activity 22)  Imitrex 50 Mg Tabs (Sumatriptan Succinate) .Marland Kitchen.. 1 At Onset of Headache 23)  Proair Hfa 108 (90 Base) Mcg/act  Aers (Albuterol Sulfate) .... 2 Puffs Every 4 Hours As Needed 24)  Epi Pen .... Use As Needed Severe Allergic Reaction 25)  Allergy Vaccine 1:10 Go (W-E) .Marland Kitchen.. 1 Injection Once Weekly 26)  Percocet 10-325 Mg Tabs (Oxycodone-Acetaminophen) .Marland Kitchen.. 1 By Mouth Three To Four Times A Day For Pain  Allergies (verified): 1)  ! Sulfadiazine (Sulfadiazine) 2)  ! Oxycontin  Past  History:  Past medical, surgical, family and social histories (including risk factors) reviewed, and no changes noted (except as noted below).  Past Medical History: Reviewed history from 10/03/2009 and no changes required. OSTEOPOROSIS (ICD-733.00) BRONCHITIS-ACUTE (ICD-466.0) ALLERGIC RHINITIS (ICD-477.9) SCREENING FOR MALIGNANNT NEOPLASM, SITE NEC (ICD-V76.49) PAIN, CHRONIC NEC (ICD-338.29) HYPERGLYCEMIA (ICD-790.6) INSOMNIA (ICD-780.52) ASTHMATIC BRONCHITIS, WITH COPD (ICD-466.0) POSTMENOPAUSAL ON HORMONE REPLACEMENT THERAPY (ICD-V07.4) DEGENERATIVE DISC DISEASE (ICD-722.6) REACTIVE AIRWAY DISEASE (ICD-493.90) ALLERGY, ENVIRONMENTAL (ICD-995.3) COMMON MIGRAINE (ICD-346.10) FIBROMYALGIA (ICD-729.1) HYPERTENSION (ICD-401.9) HYPERLIPIDEMIA (ICD-272.4) GERD (ICD-530.81) DIVERTICULOSIS, COLON (ICD-562.10) HYPOTHYROIDISM NOS (ICD-244.9) Hypertension  Past Surgical History: Reviewed history from 05/30/2009 and no changes required.                      NSVD X2  (Daughters) 1972             L Breast Cystectomy 1986             Back Surgery  DDD  Spinal Fusion 11/02/02         Colonoscopy  int. hemorrhoids, divertics    2014 05/09/04          Persantine Cardiolite - low risk study EF 76% 5/17-5/18/06 Chest pain VQ low prob, large fibroid 11/22/04          CT chest V/Q scan nml. 07/23/05          Persantine Cardiolite - inf. wall thinning (-) ischemia EF 77% 09/14/05            Right shoulder replacement ([facility]) 5/17-5/18/07 [facility] R/O'd 01/16/07         MRI  Multi lvl Arthropathy Enlarged Uterus 05/29/07       DEXA Osteoporotic Fem Neck L/S 10/08/07           Dobutamine Myoview nml  11/03/07         Arthroscopy L Knee Partial Medial Meniscectomy with MCL Sprain ( Landau) 03/10/08           Head CT Atrophy 03/11/08           Head MRI Remote Lacunar Infarcts, Subcortical white matter disease   No acute infarct 9/1-03/11/08     HOSP Atered Ment Status from dementia and polypharmacy   [facility] 03/09/08           MRI/MRA No acute infarct, some atrophy mild-mod atherosclerotic change 03/10/08          CT Head no acute changes 03/10/08          Echocardiogram  EF 60% No Emboli 9/3-03/17/08    HOSP Gait D/O w/TIA (Dr Sharene Skeans)  Vertell Limber Lovenox for DVT Prophyl Early Dementia (Dr Jeanie Sewer) 10/2-10/3/09HOSP Altered Mental Status due to UTI Anxiety Depression Fibromyalgia  Family History: Reviewed history  from 05/30/2009 and no changes required. Father: Died 67 MI, HTN Mother: Died 73 CHF, RAD, HTN Siblings: 1 brother died lung CA HTN, 1 brother died lobectomy, HTN, pneumonia 1 sister alive 47  lymphoma (non-Hodgkin's) breast CA, HTN 1 sister died 69 asthma (Staph pneumonia) HTN CV:  (+) father who died of MI, mother died CHF HBP:  (+) mother/father, all sibs CA:  lung CA brothers x 2, sister (+lymphoma) Stroke:  (-)  Social History: Reviewed history from 12/24/2006 and no changes required. Marital Status: Married, lives with husband (retired school principal) Children: 2 married, 4 grandsons Occupation: Housewife Former Smoker, 1-1/2 PPD x 20 years - 51 PYH, quit 15 or more years ago  ~87 Alcohol use-no Drug use-no  Review of Systems       The patient complains of shortness of breath with activity, acid heartburn, and indigestion.  The patient denies shortness of breath at rest, productive cough, non-productive cough, coughing up blood, chest pain, irregular heartbeats, loss of appetite, weight change, abdominal pain, difficulty swallowing, sore throat, tooth/dental problems, headaches, nasal congestion/difficulty breathing through nose, sneezing, itching, ear ache, anxiety, depression, hand/feet swelling, joint stiffness or pain, rash, change in color of mucus, and fever.    Vital Signs:  Patient profile:   77 year old female Height:      61 inches Weight:      214.13 pounds BMI:     40.61 O2 Sat:      96 % on 3 L/minpulsed Temp:     98.0 degrees F oral Pulse rate:    71 / minute BP sitting:   136 / 84  (left arm) Cuff size:   large  Vitals Entered By: Gweneth Dimitri RN (March 08, 2010 12:19 PM)  O2 Flow:  3 L/minpulsed CC: Follow up.  Pt states she does have SOB when over exerting self and occ wheezing but this is unchanged from last OV.  does have a cough at night - rarely prod with creamy mucus.   Comments Medications reviewed with patient Daytime contact number verified with patient. Gweneth Dimitri RN  March 08, 2010 12:20 PM    Physical Exam  Additional Exam:  General: A/Ox3; pleasant and cooperative, NAD,supplemental oxygen , pleasant lady SKIN: no rash, lesions NODES: no lymphadenopathy HEENT: [place]/AT, EOM- WNL, Conjuctivae- clear, PERRLA, TM-WNL, Nose- clear, not overdried, Throat- clear and wnl NECK: Supple w/ fair ROM, JVD- none, normal carotid impulses w/o bruits Thyroid- normal to palpation CHEST: coarse BS w/ no wheezing.  HEART: RRR, no m/g/r heard ABDOMEN: Soft and nl;  WGN:FAOZ, nl pulses, no edema  NEURO: Grossly intact to observation      Impression & Recommendations:  Problem # 1:  REACTIVE AIRWAY DISEASE (ICD-493.90) Assessment Unchanged reactive airway disease stable plan No change in inhaled medications.   Maintain treatment program as currently prescribed.  Medications Added to Medication List This Visit: 1)  Advair Hfa 230-21 Mcg/act Aero (Fluticasone-salmeterol) .... Two puffs twice daily 2)  Omeprazole 20 Mg Cpdr (Omeprazole) .... One by mouth two times a day  Complete Medication List: 1)  Levoxyl 112 Mcg Tabs (Levothyroxine sodium) .... One tab by mouth once daily 2)  Vitamin D3 1000 Unit Caps (Cholecalciferol) .... Take 1 capsule by mouth once a day 3)  Vitamin B-12 1000 Mcg Tabs (Cyanocobalamin) .... Take 1 tablet by mouth once a day 4)  One Daily Multiple Vitamin Tabs (Multiple vitamin) .Marland Kitchen.. 1 by mouth daily 5)  Lasix 20 Mg Tabs (Furosemide) .... Take  1 tablet by mouth once a day 6)  Klor-con 10 10 Meq  Tbcr (Potassium chloride) .... Take 1-2 tablet by mouth once a day 7)  Caltrate 600 Tabs (Calcium carbonate tabs) .Marland Kitchen.. 1 by mouth daily 8)  Oxygen  .Marland Kitchen.. 3 l/min continuously 9)  Spiriva Handihaler 18 Mcg Caps (Tiotropium bromide monohydrate) .... Inhale contents of 1 capsule once a day 10)  Advair Hfa 230-21 Mcg/act Aero (Fluticasone-salmeterol) .... Two puffs twice daily 11)  Fluticasone Propionate 50 Mcg/act Susp (Fluticasone propionate) .... 2 puffs each nostril qd and at bedtime 12)  Ranitidine Hcl 300 Mg Tabs (Ranitidine hcl) .Marland Kitchen.. 1 tablet twice a day 13)  Lyrica 50 Mg Caps (Pregabalin) .... One tab by mouth two times a day 14)  Cymbalta 60 Mg Cpep (Duloxetine hcl) .... One tab by mouth once daily 15)  Ortho-est 1.25 1.5 Mg Tabs (Estropipate) .... Take 1 tab by mouth at bedtime 16)  Medroxyprogesterone Acetate 2.5 Mg Tabs (Medroxyprogesterone acetate) .... Take 1 tab by mouth at bedtime 17)  Simvastatin 80 Mg Tabs (Simvastatin) .... One tab by mouth at night 18)  Norvasc 2.5 Mg Tabs (Amlodipine besylate) .... Take 1 tablet by mouth once a day 19)  Alprazolam 1 Mg Tbdp (Alprazolam) .... 1/2 tablet twice a day as needed for stress only 20)  Ambien 10 Mg Tabs (Zolpidem tartrate) .... One tab by mouth at night as needed insomnia 21)  Oxygen  .... Increase to 3 l/min as needed shortness of breath w/ activity 22)  Imitrex 50 Mg Tabs (Sumatriptan succinate) .Marland Kitchen.. 1 at onset of headache 23)  Proair Hfa 108 (90 Base) Mcg/act Aers (Albuterol sulfate) .... 2 puffs every 4 hours as needed 24)  Epi Pen  .... Use as needed severe allergic reaction 25)  Allergy Vaccine 1:10 Go (w-e)  .Marland Kitchen.. 1 injection once weekly 26)  Percocet 10-325 Mg Tabs (Oxycodone-acetaminophen) .Marland Kitchen.. 1 by mouth three to four times a day for pain 27)  Omeprazole 20 Mg Cpdr (Omeprazole) .... One by mouth two times a day  Other Orders: Est. Patient Level III ([address])  Patient Instructions: 1)  No change in medications 2)  Return in     4      months Prescriptions: FLUTICASONE PROPIONATE 50 MCG/ACT SUSP (FLUTICASONE PROPIONATE) 2 puffs each nostril qd and at bedtime  #1 x 6   Entered and Authorized by:   Storm Frisk MD   Signed by:   Storm Frisk MD on 03/08/2010   Method used:   [facility]        [facility]  [address] [phone]* (retail)       [address]       [place], [place]  [id number]       Ph: [phone] or [phone]       Fax: [phone]   RxID:   [id number] OMEPRAZOLE 20 MG CPDR (OMEPRAZOLE) one by mouth two times a day  #60 x 6   Entered and Authorized by:   Storm Frisk MD   Signed by:   Storm Frisk MD on 03/08/2010   Method used:   [facility]        [facility]  [address] [phone]* (retail)       [address]       [place], [place]  [address]       Ph: [phone] or  [phone]       Fax: [phone]   RxID:   [id number] ADVAIR HFA 230-21 MCG/ACT  AERO (FLUTICASONE-SALMETEROL) Two puffs twice daily  #1 x 6   Entered and Authorized by:   Storm Frisk MD   Signed by:   Storm Frisk MD on 03/08/2010   Method used:   [facility]        [facility]  [address] [phone]* (retail)       [address]       [place], [place]  [id number]       Ph: [phone] or [phone]       Fax: [phone]   RxID:   [id number]

## 2010-08-10 NOTE — Progress Notes (Signed)
Summary: refill requests for [name], xanax  Phone Note Refill Request Call back at [facility] Phone [phone] Call back at [phone] Message from:  Patient  Refills Requested: Medication #1:  ALPRAZOLAM 1 MG TBDP 1/2 tablet twice a day for stress only  Medication #2:  Ambien 10 mg's Pt needs written scripts to [facility].  Ambien is no longer on med list but pt says she takes one every night.  Please call when ready.  Initial call taken by: Lowella Petties CMA,  September 08, 2009 1:06 PM  Follow-up for Phone Call        [facility] advising pt ok to pick up. Follow-up by: Lowella Petties CMA,  September 08, 2009 2:23 PM    New/Updated Medications: AMBIEN 10 MG TABS (ZOLPIDEM TARTRATE) one tab by mouth at night as needed insomnia Prescriptions: AMBIEN 10 MG TABS (ZOLPIDEM TARTRATE) one tab by mouth at night as needed insomnia  #90 x 3   Entered and Authorized by:   Shaune Leeks MD   Signed by:   Shaune Leeks MD on 09/08/2009   Method used:   Printed then faxed to ...       [facility]  [address] [phone]* (retail)       [address]       [place], [place]  [address]       Ph: [phone] or [phone]       Fax: [phone]   RxID:   [id number] ALPRAZOLAM 1 MG TBDP (ALPRAZOLAM) 1/2 tablet twice a day for stress only  #45 x 3   Entered and Authorized by:   Shaune Leeks MD   Signed by:   Shaune Leeks MD on 09/08/2009   Method used:   Printed then faxed to ...       [facility]  [address] [phone]* (retail)       [address]       [place], [place]  [address]       Ph: [phone] or [phone]       Fax: [phone]   RxID:   [id number]

## 2010-08-10 NOTE — Assessment & Plan Note (Signed)
Summary: ROA FOR 1 MONTH FOLLOW-UP/JRR   Vital Signs:  Patient profile:   77 year old female Height:      61 inches Weight:      216.25 pounds BMI:     41.01 Temp:     98.1 degrees F oral Pulse rate:   80 / minute Pulse rhythm:   regular BP sitting:   152 / 80  (left arm) Cuff size:   large  Vitals Entered By: Delilah Shan CMA Duncan Dull) (February 14, 2010 11:03 AM) CC: 1 month follow up   History of Present Illness: DDD- Constant pain per patient.  some relief with meds but pain is limiting her ability to complete IADLs at home.  No adverse effect from meds.  Pain continues in lower lumbar area.  No new symptoms.  No gait changes except as due to pain.  No weakness in legs.  Asking about options UJ:WJXB meds.   Labs reviwed with patient.  Hypothyroidism: No adverse effect from meds.  tolerating.  No mass felt in neck by patient.   Elevated Cholesterol: Using medications without problems:yes Muscle aches: only due to the above.  this preceedes the statin.  Other complaints: no  Allergies: 1)  ! Sulfadiazine (Sulfadiazine) 2)  ! Oxycontin  Past History:  Past Medical History: Last updated: 10/03/2009 OSTEOPOROSIS (ICD-733.00) BRONCHITIS-ACUTE (ICD-466.0) ALLERGIC RHINITIS (ICD-477.9) SCREENING FOR MALIGNANNT NEOPLASM, SITE NEC (ICD-V76.49) PAIN, CHRONIC NEC (ICD-338.29) HYPERGLYCEMIA (ICD-790.6) INSOMNIA (ICD-780.52) ASTHMATIC BRONCHITIS, WITH COPD (ICD-466.0) POSTMENOPAUSAL ON HORMONE REPLACEMENT THERAPY (ICD-V07.4) DEGENERATIVE DISC DISEASE (ICD-722.6) REACTIVE AIRWAY DISEASE (ICD-493.90) ALLERGY, ENVIRONMENTAL (ICD-995.3) COMMON MIGRAINE (ICD-346.10) FIBROMYALGIA (ICD-729.1) HYPERTENSION (ICD-401.9) HYPERLIPIDEMIA (ICD-272.4) GERD (ICD-530.81) DIVERTICULOSIS, COLON (ICD-562.10) HYPOTHYROIDISM NOS (ICD-244.9) Hypertension  Review of Systems       See HPI.  Otherwise negative.    Physical Exam  General:  GEN: nad, alert and oriented, sore from back  pain HEENT: mucous membranes moist NECK: supple w/o LA, no TMG CV: rrr PULM: On O2, occ exp wheeze, no inc wob ABD: soft, +bs EXT: no edema Back: lumbar spine tender to palpation in midline and in paraspinal area which has gradually increased per patient.  no rash.  Pain with flexion and rotation.  Distally sensation intact for bilateral lower extremities.    Impression & Recommendations:  Problem # 1:  DEGENERATIVE DISC DISEASE (ICD-722.6) >25 min spent with patient, at least half of which was spent on counseling on pain meds and back pain.  will increase the oxycodone component of her meds and have her report back.  counseled on routine use and potential for constipation.  We may have to split out the APAP and opiates to allow independent adjustment.  She agrees wiht plan.   Problem # 2:  HYPERLIPIDEMIA (ICD-272.4) No change in meds.  Her updated medication list for this problem includes:    Simvastatin 80 Mg Tabs (Simvastatin) ..... One tab by mouth at night  Problem # 3:  HYPOTHYROIDISM NOS (ICD-244.9) No change in meds.  Her updated medication list for this problem includes:    Levoxyl 112 Mcg Tabs (Levothyroxine sodium) ..... One tab by mouth once daily  Complete Medication List: 1)  Levoxyl 112 Mcg Tabs (Levothyroxine sodium) .... One tab by mouth once daily 2)  Vitamin D3 1000 Unit Caps (Cholecalciferol) .... Take 1 capsule by mouth once a day 3)  Vitamin B-12 1000 Mcg Tabs (Cyanocobalamin) .... Take 1 tablet by mouth once a day 4)  One Daily Multiple Vitamin Tabs (Multiple vitamin) .Marland KitchenMarland KitchenMarland Kitchen 1  by mouth daily 5)  Lasix 20 Mg Tabs (Furosemide) .... Take 1 tablet by mouth once a day 6)  Klor-con 10 10 Meq Tbcr (Potassium chloride) .... Take 1-2 tablet by mouth once a day 7)  Caltrate 600 Tabs (Calcium carbonate tabs) .Marland Kitchen.. 1 by mouth daily 8)  Oxygen  .Marland Kitchen.. 3 l/min continuously 9)  Spiriva Handihaler 18 Mcg Caps (Tiotropium bromide monohydrate) .... Inhale contents of 1 capsule once  a day 10)  Advair Hfa 230-21 Mcg/act Aero (Fluticasone-salmeterol) .... Two puffs twice daily pt to stop serevent and flovent 11)  Fluticasone Propionate 50 Mcg/act Susp (Fluticasone propionate) .... 2 puffs each nostril qd and at bedtime 12)  Ranitidine Hcl 300 Mg Tabs (Ranitidine hcl) .Marland Kitchen.. 1 tablet twice a day 13)  Lyrica 50 Mg Caps (Pregabalin) .... One tab by mouth two times a day 14)  Cymbalta 60 Mg Cpep (Duloxetine hcl) .... One tab by mouth once daily 15)  Ortho-est 1.25 1.5 Mg Tabs (Estropipate) .... Take 1 tab by mouth at bedtime 16)  Medroxyprogesterone Acetate 2.5 Mg Tabs (Medroxyprogesterone acetate) .... Take 1 tab by mouth at bedtime 17)  Simvastatin 80 Mg Tabs (Simvastatin) .... One tab by mouth at night 18)  Norvasc 2.5 Mg Tabs (Amlodipine besylate) .... Take 1 tablet by mouth once a day 19)  Alprazolam 1 Mg Tbdp (Alprazolam) .... 1/2 tablet twice a day as needed for stress only 20)  Ambien 10 Mg Tabs (Zolpidem tartrate) .... One tab by mouth at night as needed insomnia 21)  Oxygen  .... Increase to 3 l/min as needed shortness of breath w/ activity 22)  Imitrex 50 Mg Tabs (Sumatriptan succinate) .Marland Kitchen.. 1 at onset of headache 23)  Proair Hfa 108 (90 Base) Mcg/act Aers (Albuterol sulfate) .... 2 puffs every 4 hours as needed 24)  Epi Pen  .... Use as needed severe allergic reaction 25)  Allergy Vaccine 1:10 Go (w-e)  .Marland Kitchen.. 1 injection once weekly 26)  Percocet 10-325 Mg Tabs (Oxycodone-acetaminophen) .Marland Kitchen.. 1 by mouth three to four times a day for pain  Patient Instructions: 1)  Call Dr. Delford Field about an appointment.  Let me know how you are doing next week with the new pain meds.  Make sure to take a stool softener to keep from getting constipated.  Prescriptions: PERCOCET 10-325 MG TABS (OXYCODONE-ACETAMINOPHEN) 1 by mouth three to four times a day for pain  #100 x 0   Entered and Authorized by:   Crawford Givens MD   Signed by:   Crawford Givens MD on 02/14/2010   Method used:   Print  then Give to Patient   RxID:   [id number]

## 2010-08-10 NOTE — Assessment & Plan Note (Signed)
Summary: Pulmonary OV   Copy to:  Kelly Splinter Primary Provider/Referring Provider:  Dr. Hetty Ely  CC:  Acute visit.  Pt c/o cough x 1 wk.  She states that cough is mainly dry.  Sometimes is able to prodcuce minimal amount of tan colored sputum.  Pt states that her breathing is the same.  She has more wheezing with cough..  History of Present Illness: Pulmonary OV This is a 77 year old, white female with chronic obstructive lung disease and chronic bronchitic component.      December 17, 2008 2:17 PM No new issues since last ov 1/10. No cough or new dyspnea.   Pt denies any significant sore throat, nasal congestion or excess secretions, fever, chills, sweats, unintended weight loss, pleurtic or exertional chest pain, orthopnea PND, or leg swelling Pt denies any increase in rescue therapy over baseline, denies waking up needing it or having any early am or nocturnal exacerbations of coughing/wheezing/or dyspnea.   February 11, 2009 3:50 PM Pt noting more cough for one week with mucous that is difficult to raise.  Strains to cough to the point of choking.  Mucous is thick and stringy.  No chest pain.  Pt notes sl wheeze.  Noting more dyspnea.    No f/c/s.  Preventive Screening-Counseling & Management  Alcohol-Tobacco     Smoking Status: quit > 6 months  Current Medications (verified): 1)  Proair Hfa 108 (90 Base) Mcg/act Aers (Albuterol Sulfate) .... Use One To Two Puffs Every 4 Hours As Needed 2)  Flovent Hfa 220 Mcg/act Aero (Fluticasone Propionate  Hfa) .... Inhale 2 Puff As Directed Twice A Day 3)  Spiriva Handihaler 18 Mcg Caps (Tiotropium Bromide Monohydrate) .... Inhale 1 Once A Day 4)  Serevent Diskus 50 Mcg/dose Aepb (Salmeterol Xinafoate) .... Inhale 1 Puff As Directed Twice A Day 5)  Lasix 20 Mg Tabs (Furosemide) .... Take 1 Tablet By Mouth Once A Day 6)  Norvasc 2.5 Mg Tabs (Amlodipine Besylate) .... Take 1 Tablet By Mouth Once A Day 7)  Klor-Con 10 10 Meq  Tbcr (Potassium Chloride) .... One A Day 8)  Ocuvite   Tabs (Multiple Vitamins-Minerals) .Marland Kitchen.. 1 By Mouth Daily 9)  One Daily Multiple Vitamin   Tabs (Multiple Vitamin) .Marland Kitchen.. 1 By Mouth Daily 10)  Caltrate 600   Tabs (Calcium Carbonate Tabs) .Marland Kitchen.. 1 By Mouth Daily 11)  O2 @ 2-3 L/min Via Nasal Cannula 12)  Zolpidem Tartrate 10 Mg Tabs (Zolpidem Tartrate) .... One Tasb By Mouth At Night As Needed For Insomnia 13)  Levsin 0.125 Mg/36ml  Elix (Hyoscyamine Sulfate) .... As Needed 14)  Imitrex 100 Mg  Tabs (Sumatriptan Succinate) .... As Needed 15)  Epi Pen .... As Needed 16)  Simvastatin 80 Mg  Tabs (Simvastatin) .... One Tab By Mouth At Night 17)  Levoxyl 112 Mcg Tabs (Levothyroxine Sodium) .... One Tab By Mouth Once Daily 18)  Cymbalta 60 Mg Cpep (Duloxetine Hcl) .... One Tab By Mouth Once Daily 19)  Celebrex 200 Mg Caps (Celecoxib) .Marland Kitchen.. 1 Daily By Mouth 20)  Flonase 50 Mcg/act Susp (Fluticasone Propionate) .... 2 Sprays Daily 21)  Ranitidine Hcl 300 Mg Tabs (Ranitidine Hcl) .Marland Kitchen.. 1 Tablet Twice A Day 22)  Alprazolam 1 Mg Tbdp (Alprazolam) .... 1/2 Tablet Twice A Day For Stress Only 23)  Provera 5 Mg Tabs (Medroxyprogesterone Acetate) .... One Tab By Mouth Qd 24)  Ogen 1.25 1.5 Mg Tabs (Estropipate) .... One Tab By Mouth Qd 25)  Lyrica 50 Mg  Caps (Pregabalin) .... One Tab By Mouth Two Times A Day 26)  Vesicare 10 Mg Tabs (Solifenacin Succinate) .... Onre Tab By Mouth Qd 27)  Aricept 10 Mg Tabs (Donepezil Hcl) .... One Tab By Mouth Qd 28)  Oxycodone Hcl 10 Mg Tabs (Oxycodone Hcl) .... One Tab By Mouth Two Times A Day As Needed Back Pain. Take Sparingly.  Allergies (verified): 1)  ! Sulfadiazine (Sulfadiazine) 2)  ! Oxycontin  Past History:  Past medical, surgical, family and social histories (including risk factors) reviewed, and no changes noted (except as noted below).  Past Medical History: Reviewed history from 05/11/2008 and no changes required. OSTEOPOROSIS  (ICD-733.00) BRONCHITIS-ACUTE (ICD-466.0) ALLERGIC RHINITIS (ICD-477.9) SCREENING FOR MALIGNANNT NEOPLASM, SITE NEC (ICD-V76.49) PAIN, CHRONIC NEC (ICD-338.29) HYPERGLYCEMIA (ICD-790.6) INSOMNIA (ICD-780.52) ASTHMATIC BRONCHITIS, WITH COPD (ICD-466.0) POSTMENOPAUSAL ON HORMONE REPLACEMENT THERAPY (ICD-V07.4) DEGENERATIVE DISC DISEASE (ICD-722.6) REACTIVE AIRWAY DISEASE (ICD-493.90) ALLERGY, ENVIRONMENTAL (ICD-995.3) COMMON MIGRAINE (ICD-346.10) FIBROMYALGIA (ICD-729.1) HYPERTENSION (ICD-401.9) HYPERLIPIDEMIA (ICD-272.4) GERD (ICD-530.81) DIVERTICULOSIS, COLON (ICD-562.10) HYPOTHYROIDISM NOS (ICD-244.9)  Past Surgical History: Reviewed history from 06/10/2008 and no changes required.                      NSVD X2  ([facility]) 1972             L Breast Cystectomy 1986             Back Surgery  DDD  Spinal Fusion 11/02/02         Colonoscopy  int. hemorrhoids, divertics 05/09/04          Persantine Cardiolite - low risk study EF 76% 5/17-5/18/06 Chest pain VQ low prob, large fibroid 11/22/04          CT chest V/Q scan nml. 07/23/05          Persantine Cardiolite - inf. wall thinning (-) ischemia EF 77% 09/14/05            Right shoulder replacement ([facility]) 5/17-5/18/07 [facility] R/O'd 01/16/07         MRI  Multi lvl Arthropathy Enlarged Uterus 05/29/07       DEXA Osteoporotic Fem Neck L/S 10/08/07           Dobutamine Myoview nml  11/03/07         Arthroscopy L Knee Partial Medial Meniscectomy with MCL Sprain ( [facility]) 03/10/08           Head CT Atrophy 03/11/08           Head MRI Remote Lacunar Infarcts, Subcortical white matter disease   No acute infarct 9/1-03/11/08     HOSP Atered Ment Status from dementia and polypharmacy   [facility] 03/09/08           MRI/MRA No acute infarct, some atrophy mild-mod atherosclerotic change 03/10/08          CT Head no acute changes 03/10/08          Echocardiogram  EF 60% No Emboli 9/3-03/17/08    HOSP Gait D/O w/TIA (Dr Sharene Skeans)  Vertell Limber Lovenox for DVT  Prophyl Early Dementia (Dr Jeanie Sewer) 10/2-10/3/09HOSP Altered Mental Status due to UTI Anxiety Depression Fibromyalgia  Family History: Reviewed history from 03/17/2007 and no changes required. Father: Died 36 MI, HTN Mother: Died 42 CHF, RAD, HTN Siblings: 1 brother died lung CA HTN, 1 brother died lobectomy, HTN, pneumonia 1 sister alive 95  lymphoma (non-Hodgkin's) breast CA, HTN 1 sister died 66 asthma (Staph pneumonia) HTN CV:  (+) father  who died of MI, mother died CHF HBP:  (+) mother/father, all sibs CA:  lung CA brothers x 2, sister (+lymphoma) Stroke:  (-)  Social History: Reviewed history from 12/24/2006 and no changes required. Marital Status: Married, lives with husband (retired school principal) Children: 2 married, 4 grandsons Occupation: Housewife Former Smoker, 1-1/2 PPD x 20 years - 47 PYH, quit 15 or more years ago  ~87 Alcohol use-no Drug use-no  Review of Systems       The patient complains of shortness of breath with activity, shortness of breath at rest, productive cough, and non-productive cough.  The patient denies coughing up blood, chest pain, irregular heartbeats, acid heartburn, indigestion, loss of appetite, weight change, abdominal pain, difficulty swallowing, sore throat, tooth/dental problems, headaches, nasal congestion/difficulty breathing through nose, sneezing, itching, ear ache, anxiety, depression, hand/feet swelling, joint stiffness or pain, rash, change in color of mucus, and fever.    Vital Signs:  Patient profile:   77 year old female Weight:      221 pounds O2 Sat:      95 % on 3 liters pulsed Temp:     98.3 degrees F oral Pulse rate:   75 / minute BP sitting:   124 / 72  (left arm) Cuff size:   large  Vitals Entered By: Vernie Murders (February 11, 2009 3:39 PM)  O2 Flow:  3 liters pulsed  Physical Exam  Additional Exam:  General: A/Ox3; pleasant and cooperative, NAD,supplemental oxygen , walker SKIN: no rash, lesions NODES: no  lymphadenopathy HEENT: [place]/AT, EOM- WNL, Conjuctivae- clear, PERRLA, TM-WNL, Nose- clear, Throat- clear and wnl NECK: Supple w/ fair ROM, JVD- none, normal carotid impulses w/o bruits Thyroid- normal to palpation CHEST: insp / exp wheeze,  poor airflow HEART: RRR, no m/g/r heard ABDOMEN: Soft and nl; nml bowel sounds; no organomegaly or masses noted ZOX:WRUE, nl pulses, no edema  NEURO: Grossly intact to observation      Impression & Recommendations:  Problem # 1:  REACTIVE AIRWAY DISEASE (ICD-493.90) Assessment Deteriorated AB flare with increased lower airway inflammation plan Doxycycline one twice daily x 7 days A depomedrol injection 120mg  IM No other medication changes Return 6 weeks  Medications Added to Medication List This Visit: 1)  Doxycycline Monohydrate 100 Mg Caps (Doxycycline monohydrate) .... By mouth twice daily  Complete Medication List: 1)  Proair Hfa 108 (90 Base) Mcg/act Aers (Albuterol sulfate) .... Use one to two puffs every 4 hours as needed 2)  Flovent Hfa 220 Mcg/act Aero (Fluticasone propionate  hfa) .... Inhale 2 puff as directed twice a day 3)  Spiriva Handihaler 18 Mcg Caps (Tiotropium bromide monohydrate) .... Inhale 1 once a day 4)  Serevent Diskus 50 Mcg/dose Aepb (Salmeterol xinafoate) .... Inhale 1 puff as directed twice a day 5)  Lasix 20 Mg Tabs (Furosemide) .... Take 1 tablet by mouth once a day 6)  Norvasc 2.5 Mg Tabs (Amlodipine besylate) .... Take 1 tablet by mouth once a day 7)  Klor-con 10 10 Meq Tbcr (Potassium chloride) .... One a day 8)  Ocuvite Tabs (Multiple vitamins-minerals) .Marland Kitchen.. 1 by mouth daily 9)  One Daily Multiple Vitamin Tabs (Multiple vitamin) .Marland Kitchen.. 1 by mouth daily 10)  Caltrate 600 Tabs (Calcium carbonate tabs) .Marland Kitchen.. 1 by mouth daily 11)  O2 @ 2-3 L/min Via Nasal Cannula  12)  Zolpidem Tartrate 10 Mg Tabs (Zolpidem tartrate) .... One tasb by mouth at night as needed for insomnia 13)  Levsin 0.125 Mg/31ml Elix (Hyoscyamine  sulfate) .... As needed 14)  Imitrex 100 Mg Tabs (Sumatriptan succinate) .... As needed 15)  Epi Pen  .... As needed 16)  Simvastatin 80 Mg Tabs (Simvastatin) .... One tab by mouth at night 17)  Levoxyl 112 Mcg Tabs (Levothyroxine sodium) .... One tab by mouth once daily 18)  Cymbalta 60 Mg Cpep (Duloxetine hcl) .... One tab by mouth once daily 19)  Celebrex 200 Mg Caps (Celecoxib) .Marland Kitchen.. 1 daily by mouth 20)  Flonase 50 Mcg/act Susp (Fluticasone propionate) .... 2 sprays daily 21)  Ranitidine Hcl 300 Mg Tabs (Ranitidine hcl) .Marland Kitchen.. 1 tablet twice a day 22)  Alprazolam 1 Mg Tbdp (Alprazolam) .... 1/2 tablet twice a day for stress only 23)  Provera 5 Mg Tabs (Medroxyprogesterone acetate) .... One tab by mouth qd 24)  Ogen 1.25 1.5 Mg Tabs (Estropipate) .... One tab by mouth qd 25)  Lyrica 50 Mg Caps (Pregabalin) .... One tab by mouth two times a day 26)  Vesicare 10 Mg Tabs (Solifenacin succinate) .... Onre tab by mouth qd 27)  Aricept 10 Mg Tabs (Donepezil hcl) .... One tab by mouth qd 28)  Oxycodone Hcl 10 Mg Tabs (Oxycodone hcl) .... One tab by mouth two times a day as needed back pain. take sparingly. 29)  Doxycycline Monohydrate 100 Mg Caps (Doxycycline monohydrate) .... By mouth twice daily  Other Orders: Est. Patient Level IV (16109) Depo- Medrol 80mg  (J1040) Depo- Medrol 40mg  (J1030) Admin of Therapeutic Inj  intramuscular or subcutaneous (60454)  Patient Instructions: 1)  Doxycycline one twice daily was  sent to your pharmacy 2)  A depomedrol injection will be given 3)  No other medication changes 4)  Return 6 weeks Prescriptions: DOXYCYCLINE MONOHYDRATE 100 MG  CAPS (DOXYCYCLINE MONOHYDRATE) By mouth twice daily  #14 x 0   Entered and Authorized by:   Storm Frisk MD   Signed by:   Storm Frisk MD on 02/11/2009   Method used:   [facility]        [facility]  [address] [phone]* (retail)       [address]       [place], [place]   [id number]       Ph: [phone] or [phone]       Fax: [phone]   RxID:   [id number]    Medication Administration  Injection # 1:    Medication: Depo- Medrol 80mg     Diagnosis: ASTHMATIC BRONCHITIS, WITH COPD (ICD-466.0)    Route: IM    Site: LUOQ gluteus    Exp Date: 05/2011    Lot #: [id number]    Mfr: [employer]    Patient tolerated injection without complications    Given by: Zackery Barefoot CMA (February 11, 2009 4:10 PM)  Injection # 2:    Medication: Depo- Medrol 40mg     Diagnosis: ASTHMATIC BRONCHITIS, WITH COPD (ICD-466.0)    Route: IM    Site: LUOQ gluteus    Exp Date: 05/2011    Lot #: [id number]    Mfr: [employer]    Patient tolerated injection without complications    Given by: Zackery Barefoot CMA (February 11, 2009 4:10 PM)  Orders Added: 1)  Est. Patient Level IV [95188] 2)  Depo- Medrol 80mg  [J1040] 3)  Depo- Medrol 40mg  [J1030] 4)  Admin of Therapeutic Inj  intramuscular or subcutaneous [41660]

## 2010-08-10 NOTE — Assessment & Plan Note (Signed)
Summary: 3 MTH FU/CLE   Vital Signs:  Patient profile:   77 year old female Weight:      222 pounds O2 Sat:      95 % on 3 L/min Temp:     98.5 degrees F oral Pulse rate:   76 / minute Pulse rhythm:   regular BP sitting:   128 / 60  (left arm) Cuff size:   large  Vitals Entered By: Sydell Axon LPN (August 31, 2009 12:11 PM)  O2 Flow:  3 L/min CC: 3 Month follow-up after labs, just came from appt. with Dr. Delford Field   History of Present Illness: Pt here for three month followup. She saw Dr Delford Field this AM and was wheezing so started baclkon cortisone. She has been coughing a lot.  We checked Vit D and Sugar again. She feels well other than the wheezing and is in good spirits.  Problems Prior to Update: 1)  Hypopotassemia  (ICD-276.8) 2)  Anal Fissure  (ICD-565.0) 3)  Hypoxemia  (ICD-799.02) 4)  Urinary Tract Infection, Acute, Recurrent  (ICD-599.0) 5)  Anemia, Mild  (ICD-285.9) 6)  Anxiety Disorder  (ICD-300.00) 7)  Preoperative Examination  (ICD-V72.84) 8)  Sinusitis, Acute Maxillary  (ICD-461.0) 9)  Osteoporosis  (ICD-733.00) 10)  Allergic Rhinitis  (ICD-477.9) 11)  Screening For Malignannt Neoplasm, Site Nec  (ICD-V76.49) 12)  Pain, Chronic Nec  (ICD-338.29) 13)  Hyperglycemia  (ICD-790.6) 14)  Insomnia  (ICD-780.52) 15)  Asthmatic Bronchitis, With COPD  (ICD-466.0) 16)  Postmenopausal On Hormone Replacement Therapy  (ICD-V07.4) 17)  Degenerative Disc Disease  (ICD-722.6) 18)  Reactive Airway Disease  (ICD-493.90) 19)  Allergy, Environmental  (ICD-995.3) 20)  Common Migraine  (ICD-346.10) 21)  Fibromyalgia  (ICD-729.1) 22)  Hypertension  (ICD-401.9) 23)  Hyperlipidemia  (ICD-272.4) 24)  Gerd  (ICD-530.81) 25)  Diverticulosis, Colon  (ICD-562.10) 26)  Hypothyroidism Nos  (ICD-244.9)  Medications Prior to Update: 1)  Lasix 20 Mg Tabs (Furosemide) .... Take 1 Tablet By Mouth Once A Day 2)  Norvasc 2.5 Mg Tabs (Amlodipine Besylate) .... Take 1 Tablet By Mouth Once A  Day 3)  Klor-Con 10 10 Meq Tbcr (Potassium Chloride) .... One A Day 4)  Ocuvite   Tabs (Multiple Vitamins-Minerals) .Marland Kitchen.. 1 By Mouth Daily 5)  One Daily Multiple Vitamin   Tabs (Multiple Vitamin) .Marland Kitchen.. 1 By Mouth Daily 6)  Caltrate 600   Tabs (Calcium Carbonate Tabs) .Marland Kitchen.. 1 By Mouth Daily 7)  O2 @ 2-3 L/min Via Nasal Cannula 8)  Imitrex 100 Mg  Tabs (Sumatriptan Succinate) .... As Needed 9)  Epi Pen .... As Needed 10)  Allergy Vaccine 1:10 Go (W-E) 11)  Simvastatin 80 Mg  Tabs (Simvastatin) .... One Tab By Mouth At Night 12)  Levoxyl 112 Mcg Tabs (Levothyroxine Sodium) .... One Tab By Mouth Once Daily 13)  Cymbalta 60 Mg Cpep (Duloxetine Hcl) .... One Tab By Mouth Once Daily 14)  Celebrex 200 Mg Caps (Celecoxib) .Marland Kitchen.. 1 Daily By Mouth 15)  Ranitidine Hcl 300 Mg Tabs (Ranitidine Hcl) .Marland Kitchen.. 1 Tablet Twice A Day 16)  Alprazolam 1 Mg Tbdp (Alprazolam) .... 1/2 Tablet Twice A Day For Stress Only 17)  Lyrica 50 Mg Caps (Pregabalin) .... One Tab By Mouth Two Times A Day 18)  Vesicare 10 Mg Tabs (Solifenacin Succinate) .... Onre Tab By Mouth Qd 19)  Oxycodone Hcl 10 Mg Tabs (Oxycodone Hcl) .... One Tab By Mouth Three  Times A Day As Needed Back Pain. Take Sparingly. 20)  Ipratropium  Bromide 0.06 % Soln (Ipratropium Bromide) .Marland Kitchen.. 1-2 Spray Each Nostril Three Times A Day As Needed Runny Nose 21)  Serevent Diskus 50 Mcg/dose Aepb (Salmeterol Xinafoate) .... One Puff Two Times A Day 22)  Flovent Hfa 220 Mcg/act  Aero (Fluticasone Propionate  Hfa) .... Two  Puffs Twice Daily 23)  Nasonex 50 Mcg/act  Susp (Mometasone Furoate) .... Two Puffs Each Nostril Daily 24)  Spiriva Handihaler 18 Mcg  Caps (Tiotropium Bromide Monohydrate) .... Two Puffs in Handihaler Daily 25)  Proair Hfa 108 (90 Base) Mcg/act  Aers (Albuterol Sulfate) .Marland Kitchen.. 1-2 Puffs Every 4-6 Hours As Needed 26)  Prednisone 10 Mg  Tabs (Prednisone) .... Take As Directed 4 Each Am X3days, 3 X 3days, 2 X 3days, 1 X 3days Then Stop  Allergies: 1)  !  Sulfadiazine (Sulfadiazine) 2)  ! Oxycontin  Physical Exam  General:  Well-developed,well-nourished,in no acute distress; alert,appropriate and cooperative throughout examination, obese, mildly congested and minimally hoarse. Head:  Normocephalic and atraumatic without obvious abnormalities. No apparent alopecia or balding. Sinuses NT. Eyes:  Conjunctiva clear bilaterally.  Ears:  External ear exam shows no significant lesions or deformities.  Otoscopic examination reveals clear canals, tympanic membranes are intact bilaterally without bulging, retraction, inflammation or discharge. Hearing is grossly normal bilaterally. Nose:  External nasal examination shows no deformity or inflammation. Nasal mucosa are pink and moist without lesions or exudates. Mildly inflamed, no active bleeding site seen, N/C in place w/O2. Mouth:  Oral mucosa and oropharynx without lesions or exudates.  Teeth in good repair. Neck:  No deformities, masses, or tenderness noted. Lungs:  Normal respiratory effort, chest expands symmetrically. Lungs are clear to auscultation, no crackles but slight wheezing. Slightly distant breath sounds. Abdomen:  Bowel sounds positive,abdomen soft and non-tender without masses, organomegaly or hernias noted. Obese.   Impression & Recommendations:  Problem # 1:  HYPOPOTASSEMIA (ICD-276.8) Assessment Unchanged Unchanged...incr K to 11/2 tabs a day. If the 1/2 pill tastes too bad go to 2 K pills a day.  Problem # 2:  HYPERGLYCEMIA (ICD-790.6) Assessment: Unchanged Stable.Marland KitchenMarland KitchenNeeds to stay away from sweets and carbs.   Complete Medication List: 1)  Lasix 20 Mg Tabs (Furosemide) .... Take 1 tablet by mouth once a day 2)  Norvasc 2.5 Mg Tabs (Amlodipine besylate) .... Take 1 tablet by mouth once a day 3)  Klor-con 10 10 Meq Tbcr (Potassium chloride) .Marland Kitchen.. 11/2  a day 4)  Ocuvite Tabs (Multiple vitamins-minerals) .Marland Kitchen.. 1 by mouth daily 5)  One Daily Multiple Vitamin Tabs (Multiple vitamin)  .Marland Kitchen.. 1 by mouth daily 6)  Caltrate 600 Tabs (Calcium carbonate tabs) .Marland Kitchen.. 1 by mouth daily 7)  O2 @ 2-3 L/min Via Nasal Cannula  8)  Imitrex 100 Mg Tabs (Sumatriptan succinate) .... As needed 9)  Epi Pen  .... As needed 10)  Allergy Vaccine 1:10 Go (w-e)  11)  Simvastatin 80 Mg Tabs (Simvastatin) .... One tab by mouth at night 12)  Levoxyl 112 Mcg Tabs (Levothyroxine sodium) .... One tab by mouth once daily 13)  Cymbalta 60 Mg Cpep (Duloxetine hcl) .... One tab by mouth once daily 14)  Celebrex 200 Mg Caps (Celecoxib) .Marland Kitchen.. 1 daily by mouth 15)  Ranitidine Hcl 300 Mg Tabs (Ranitidine hcl) .Marland Kitchen.. 1 tablet twice a day 16)  Alprazolam 1 Mg Tbdp (Alprazolam) .... 1/2 tablet twice a day for stress only 17)  Lyrica 50 Mg Caps (Pregabalin) .... One tab by mouth two times a day 18)  Vesicare 10 Mg Tabs (Solifenacin  succinate) .... Onre tab by mouth qd 19)  Oxycodone Hcl 10 Mg Tabs (Oxycodone hcl) .... One tab by mouth three  times a day as needed back pain. take sparingly. 20)  Ipratropium Bromide 0.06 % Soln (Ipratropium bromide) .Marland Kitchen.. 1-2 spray each nostril three times a day as needed runny nose 21)  Serevent Diskus 50 Mcg/dose Aepb (Salmeterol xinafoate) .... One puff two times a day 22)  Flovent Hfa 220 Mcg/act Aero (Fluticasone propionate  hfa) .... Two  puffs twice daily 23)  Nasonex 50 Mcg/act Susp (Mometasone furoate) .... Two puffs each nostril daily 24)  Spiriva Handihaler 18 Mcg Caps (Tiotropium bromide monohydrate) .... Two puffs in handihaler daily 25)  Proair Hfa 108 (90 Base) Mcg/act Aers (Albuterol sulfate) .Marland Kitchen.. 1-2 puffs every 4-6 hours as needed 26)  Prednisone 10 Mg Tabs (Prednisone) .... Take as directed 4 each am x3days, 3 x 3days, 2 x 3days, 1 x 3days then stop  Patient Instructions: 1)  RTC as needed.  Current Allergies (reviewed today): ! SULFADIAZINE (SULFADIAZINE) ! OXYCONTIN

## 2010-08-10 NOTE — Progress Notes (Signed)
Summary: wants 90 day supply of ambien  Phone Note Refill Request Call back at Home Phone [phone] Message from:  Patient  Refills Requested: Medication #1:  AMBIEN 10 MG TABS one tab by mouth at night as needed insomnia Pt is asking for a 90 day supply with one refill to be sent to [facility].  Please let pt know if this is done.  Initial call taken by: Lowella Petties CMA,  March 21, 2010 10:32 AM  Follow-up for Phone Call        please fax the hard copy in and then notify patient.  Follow-up by: Crawford Givens MD,  March 21, 2010 1:56 PM  Additional Follow-up for Phone Call Additional follow up Details #1::        Faxed.  Patient Advised.  Additional Follow-up by: Delilah Shan CMA Duncan Dull),  March 21, 2010 2:03 PM    Prescriptions: AMBIEN 10 MG TABS (ZOLPIDEM TARTRATE) one tab by mouth at night as needed insomnia  #90 x 1   Entered and Authorized by:   Crawford Givens MD   Signed by:   Crawford Givens MD on 03/21/2010   Method used:   Print then Give to Patient   RxID:   [id number]

## 2010-08-10 NOTE — Miscellaneous (Signed)
Summary: Orders Update  Clinical Lists Changes  Orders: Added new Referral order of DME Referral (DME) - Signed 

## 2010-08-10 NOTE — Progress Notes (Signed)
Summary: alprazolam   Phone Note Refill Request Message from:  Fax from Pharmacy on July 07, 2010 3:25 PM  Refills Requested: Medication #1:  ALPRAZOLAM 1 MG TBDP 1/2 tablet twice a day as needed for stress only   Last Refilled: 02/13/2010 Refill request from [facility] [address] [phone]  Initial call taken by: Melody Comas,  July 07, 2010 3:26 PM  Follow-up for Phone Call        this should have been available for her yesterday.  please check on that.  thanks.  Follow-up by: Crawford Givens MD,  July 07, 2010 5:07 PM  Additional Follow-up for Phone Call Additional follow up Details #1::        Patient picked up rx. Additional Follow-up by: Delilah Shan CMA Kalum Minner Dull),  July 07, 2010 5:09 PM

## 2010-08-10 NOTE — Progress Notes (Signed)
Summary: refill request for alprazolam  Phone Note Refill Request Message from:  Fax from Pharmacy  Refills Requested: Medication #1:  ALPRAZOLAM 1 MG TBDP 1/2 tablet twice a day as needed for stress only   Last Refilled: 02/13/2010 Faxed form from [facility] [address], [phone].  Initial call taken by: Lowella Petties CMA, AAMA,  July 04, 2010 2:47 PM  Follow-up for Phone Call        please print off for me to sign along with her other rx.   Follow-up by: Crawford Givens MD,  July 05, 2010 12:52 PM    Prescriptions: ALPRAZOLAM 1 MG TBDP (ALPRAZOLAM) 1/2 tablet twice a day as needed for stress only  #30 x 1   Entered by:   Delilah Shan CMA (AAMA)   Authorized by:   Crawford Givens MD   Signed by:   Delilah Shan CMA (AAMA) on 07/05/2010   Method used:   Print then Give to Patient   RxID:   [id number] ALPRAZOLAM 1 MG TBDP (ALPRAZOLAM) 1/2 tablet twice a day as needed for stress only  #30 x 1   Entered and Authorized by:   Crawford Givens MD   Signed by:   Crawford Givens MD on 07/05/2010   Method used:   Print then Give to Patient   RxID:   [id number]   Appended Document: refill request for alprazolam Placed with other Rx. for pick up.

## 2010-08-10 NOTE — Progress Notes (Signed)
Summary: refill request for oxycodone  Phone Note Refill Request Call back at Home Phone [phone] Message from:  Patient  Refills Requested: Medication #1:  OXYCODONE HCL 10 MG TABS one tab by mouth three  times a day as needed back pain. Take sparingly. Phoned request from pt, please call when ready.  Initial call taken by: Lowella Petties CMA,  August 18, 2009 11:58 AM  Follow-up for Phone Call        Advised pt ok to pick up Follow-up by: Lowella Petties CMA,  August 18, 2009 12:39 PM    Prescriptions: OXYCODONE HCL 10 MG TABS (OXYCODONE HCL) one tab by mouth three  times a day as needed back pain. Take sparingly.  #90 x 0   Entered and Authorized by:   Shaune Leeks MD   Signed by:   Shaune Leeks MD on 08/18/2009   Method used:   Print then Give to Patient   RxID:   [id number] OXYCODONE HCL 10 MG TABS (OXYCODONE HCL) one tab by mouth three  times a day as needed back pain. Take sparingly.  #90 x 0   Entered and Authorized by:   Shaune Leeks MD   Signed by:   Shaune Leeks MD on 08/18/2009   Method used:   Telephoned to ...       [facility]  [phone]* (retail)       [address]       [place], [place]  [id number]       Ph: [phone] or [phone]       Fax: [phone]   RxID:   [id number]

## 2010-08-10 NOTE — Assessment & Plan Note (Signed)
Summary: SEVERAL ISSUES/CLE   Vital Signs:  Patient profile:   77 year old female Height:      61 inches Weight:      211.75 pounds BMI:     40.15 O2 Sat:      97 % on 3 L/min Temp:     98 degrees F oral Pulse rate:   80 / minute Pulse rhythm:   regular Resp:     16 per minute BP sitting:   120 / 66  (left arm) Cuff size:   large  Vitals Entered By: Delilah Shan CMA (AAMA) (July 26, 2010 2:40 PM)  O2 Flow:  3 L/min CC: Several issues   History of Present Illness: Needs refills and she left her list at home.  She'll check the list and then contact us.    Back pain: Asking about lidoderm patch for back pain.  She has long term back pain that radiates down into the hips and legs.  On cymbalta and lyrica.  Taking the percocet with some relief, she's asking about going back up to the 10mg  and occ breaking them in half if needing less.  She was also asking about lidoderm patch.  She was prev injected at pain clinic.  No new symptoms per patient.  she is asking about options at this point.   Allergies: 1)  ! Sulfadiazine (Sulfadiazine) 2)  ! Oxycontin  Past History:  Past Medical History: OSTEOPOROSIS (ICD-733.00) BRONCHITIS-ACUTE (ICD-466.0) ALLERGIC RHINITIS (ICD-477.9) SCREENING FOR MALIGNANNT NEOPLASM, SITE NEC (ICD-V76.49) PAIN, CHRONIC NEC (ICD-338.29) HYPERGLYCEMIA (ICD-790.6) INSOMNIA (ICD-780.52) ASTHMATIC BRONCHITIS, WITH COPD (ICD-466.0) POSTMENOPAUSAL ON HORMONE REPLACEMENT THERAPY (ICD-V07.4) DEGENERATIVE DISC DISEASE (ICD-722.6) REACTIVE AIRWAY DISEASE (ICD-493.90) ALLERGY, ENVIRONMENTAL (ICD-995.3) COMMON MIGRAINE (ICD-346.10) FIBROMYALGIA (ICD-729.1) HYPERTENSION (ICD-401.9) HYPERLIPIDEMIA (ICD-272.4) GERD (ICD-530.81) DIVERTICULOSIS, COLON (ICD-562.10) HYPOTHYROIDISM NOS (ICD-244.9) chronic back pain s/p pain clinic eval, injection  Review of Systems       See HPI.  Otherwise negative.  Constipation controlled with stool softeners, laxitives.      Physical Exam  General:  GEN: nad, alert and oriented, sore from back pain, on O2 HEENT: mucous membranes moist NECK: supple w/o LA, no TMG CV: rrr PULM: On O2, occ exp wheeze, no inc wob ABD: soft, +bs EXT: no edema Back: lumbar spine tender to palpation in midline and in paraspinal area.  this is in a horizontal distribution below the waistlife  no rash.  Pain with back flexion and ext, less so with rotation.  Distal sensation intact for bilateral lower extremities w/o foot drop or focal weakness.    Impression & Recommendations:  Problem # 1:  PAIN, CHRONIC NEC (ICD-338.29) >25 min spent with patient, at least half of which was spent on counseling on plan.  I would not increase the opiates at this point.  She wanted to try the lidoderm patches and I think this is potentially a low risk,good reward intervention.  If not improved with this, she'll let me know and we can consider changing the percocet dose.  She'll call back about her other meds. She knows she'll need percocet rx soon, so this was done today.  Sedation, GI caution for opiates.    Complete Medication List: 1)  Levoxyl 112 Mcg Tabs (Levothyroxine sodium) .... One tab by mouth once daily 2)  Vitamin D3 1000 Unit Caps (Cholecalciferol) .... Take 1 capsule by mouth once a day 3)  Vitamin B-12 1000 Mcg Tabs (Cyanocobalamin) .... Take 1 tablet by mouth once a day 4)  One Daily Multiple  Vitamin Tabs (Multiple vitamin) .Marland Kitchen.. 1 by mouth daily 5)  Klor-con 10 10 Meq Tbcr (Potassium chloride) .... Take 1-2 tablet by mouth once a day 6)  Caltrate 600 Tabs (Calcium carbonate tabs) .Marland Kitchen.. 1 by mouth daily 7)  Oxygen  .Marland Kitchen.. 3 l/min continuously 8)  Spiriva Handihaler 18 Mcg Caps (Tiotropium bromide monohydrate) .... Inhale contents of 1 capsule once a day 9)  Advair Hfa 230-21 Mcg/act Aero (Fluticasone-salmeterol) .... Two puffs twice daily 10)  Fluticasone Propionate 50 Mcg/act Susp (Fluticasone propionate) .... 2 puffs each nostril qd  and at bedtime 11)  Ranitidine Hcl 300 Mg Tabs (Ranitidine hcl) .Marland Kitchen.. 1 tablet twice a day 12)  Lyrica 50 Mg Caps (Pregabalin) .... One tab by mouth two times a day 13)  Cymbalta 60 Mg Cpep (Duloxetine hcl) .... One tab by mouth once daily 14)  Norvasc 2.5 Mg Tabs (Amlodipine besylate) .... Take 1 tablet by mouth once a day 15)  Alprazolam 1 Mg Tbdp (Alprazolam) .... 1/2 tablet twice a day as needed for stress only 16)  Ambien 10 Mg Tabs (Zolpidem tartrate) .... One tab by mouth at night as needed insomnia 17)  Imitrex 50 Mg Tabs (Sumatriptan succinate) .Marland Kitchen.. 1 at onset of headache 18)  Proair Hfa 108 (90 Base) Mcg/act Aers (Albuterol sulfate) .... 2 puffs every 4 hours as needed 19)  Percocet 7.5-325 Mg Tabs (Oxycodone-acetaminophen) .Marland Kitchen.. 1 by mouth 3-4 times a day for pain 20)  Omeprazole 20 Mg Cpdr (Omeprazole) .... One by mouth two times a day 21)  Lidoderm 5 % Ptch (Lidocaine) .... Apply 1-2 patches on your lower back for 12 hours for pain.  Patient Instructions: 1)  Try the lidocaine patch on your lower back and let me know if it is helping.  Let me know what other refills you need- either call me or send me a letter.  Take care.   Prescriptions: PERCOCET 7.5-325 MG TABS (OXYCODONE-ACETAMINOPHEN) 1 by mouth 3-4 times a day for pain  #100 x 0   Entered and Authorized by:   Crawford Givens MD   Signed by:   Crawford Givens MD on 07/26/2010   Method used:   Print then Give to Patient   RxID:   [id number] LIDODERM 5 % PTCH (LIDOCAINE) Apply 1-2 patches on your lower back for 12 hours for pain.  #30 x 1   Entered and Authorized by:   Crawford Givens MD   Signed by:   Crawford Givens MD on 07/26/2010   Method used:   Print then Give to Patient   RxID:   [id number]    Orders Added: 1)  Est. Patient Level IV [84696]    Current Allergies (reviewed today): ! SULFADIAZINE (SULFADIAZINE) ! OXYCONTIN

## 2010-08-10 NOTE — Assessment & Plan Note (Signed)
Summary: F/U,REFILL MEDICATIONS/CLE   Vital Signs:  Patient profile:   77 year old female Weight:      217.75 pounds O2 Sat:      96 % on 3 L/min Temp:     98.1 degrees F oral Pulse rate:   76 / minute Pulse rhythm:   regular BP sitting:   120 / 68  (left arm) Cuff size:   large  Vitals Entered By: Sydell Axon LPN (Dec 06, 2009 12:09 PM)  O2 Flow:  3 L/min CC: Needs a refill on Oxycodone and would like it stronger   History of Present Illness: Pt here for refill of medication. Dr Channing Mutters wants to do surgery but she doesn't want to have it done. Her insurance company denied the surgery. She doesn't think surgery on one level of the neck could make enough difference in the whole back to make the risk of surgery worthwhile. She is begging to have her pain medication increased. She has been to multiple Chronic Pain Med doctors and had many shots in  the back, many kinds of trmts and she still has her pain and still needs her pain meds. She otherwise is doing reasonably well and has no other complaints.  Problems Prior to Update: 1)  Hypopotassemia  (ICD-276.8) 2)  Anal Fissure  (ICD-565.0) 3)  Hypoxemia  (ICD-799.02) 4)  Urinary Tract Infection, Acute, Recurrent  (ICD-599.0) 5)  Anemia, Mild  (ICD-285.9) 6)  Anxiety Disorder  (ICD-300.00) 7)  Preoperative Examination  (ICD-V72.84) 8)  Sinusitis, Acute Maxillary  (ICD-461.0) 9)  Osteoporosis  (ICD-733.00) 10)  Allergic Rhinitis  (ICD-477.9) 11)  Screening For Malignannt Neoplasm, Site Nec  (ICD-V76.49) 12)  Pain, Chronic Nec  (ICD-338.29) 13)  Hyperglycemia  (ICD-790.6) 14)  Insomnia  (ICD-780.52) 15)  Asthmatic Bronchitis, With COPD  (ICD-466.0) 16)  Postmenopausal On Hormone Replacement Therapy  (ICD-V07.4) 17)  Degenerative Disc Disease  (ICD-722.6) 18)  Reactive Airway Disease  (ICD-493.90) 19)  Allergy, Environmental  (ICD-995.3) 20)  Common Migraine  (ICD-346.10) 21)  Fibromyalgia  (ICD-729.1) 22)  Hypertension   (ICD-401.9) 23)  Hyperlipidemia  (ICD-272.4) 24)  Gerd  (ICD-530.81) 25)  Diverticulosis, Colon  (ICD-562.10) 26)  Hypothyroidism Nos  (ICD-244.9)  Medications Prior to Update: 1)  Levoxyl 112 Mcg Tabs (Levothyroxine Sodium) .... One Tab By Mouth Once Daily 2)  Vitamin D3 1000 Unit Caps (Cholecalciferol) .... Take 1 Capsule By Mouth Once A Day 3)  Vitamin B-12 1000 Mcg Tabs (Cyanocobalamin) .... Take 1 Tablet By Mouth Once A Day 4)  One Daily Multiple Vitamin   Tabs (Multiple Vitamin) .Marland Kitchen.. 1 By Mouth Daily 5)  Lasix 20 Mg Tabs (Furosemide) .... Take 1 Tablet By Mouth Once A Day 6)  Klor-Con 10 10 Meq Tbcr (Potassium Chloride) .... Take 1-2 Tablet By Mouth Once A Day 7)  Caltrate 600   Tabs (Calcium Carbonate Tabs) .Marland Kitchen.. 1 By Mouth Daily 8)  Oxygen .Marland Kitchen.. 3 L/min Continuously 9)  Spiriva Handihaler 18 Mcg  Caps (Tiotropium Bromide Monohydrate) .... Inhale Contents of 1 Capsule Once A Day 10)  Advair Hfa 230-21 Mcg/act  Aero (Fluticasone-Salmeterol) .... Two Puffs Twice Daily Pt To Stop Serevent and Flovent 11)  Fluticasone Propionate 50 Mcg/act Susp (Fluticasone Propionate) .... 2 Puffs Each Nostril Qd and At Bedtime 12)  Ranitidine Hcl 300 Mg Tabs (Ranitidine Hcl) .Marland Kitchen.. 1 Tablet Twice A Day 13)  Lyrica 50 Mg Caps (Pregabalin) .... One Tab By Mouth Two Times A Day 14)  Cymbalta 60 Mg  Cpep (Duloxetine Hcl) .... One Tab By Mouth Once Daily 15)  Vesicare 10 Mg Tabs (Solifenacin Succinate) .... Take 1 Tab By Mouth At Bedtime 16)  Ortho-Est 1.25 1.5 Mg Tabs (Estropipate) .... Take 1 Tab By Mouth At Bedtime 17)  Medroxyprogesterone Acetate 2.5 Mg Tabs (Medroxyprogesterone Acetate) .... Take 1 Tab By Mouth At Bedtime 18)  Simvastatin 80 Mg  Tabs (Simvastatin) .... One Tab By Mouth At Night 19)  Norvasc 2.5 Mg Tabs (Amlodipine Besylate) .... Take 1 Tablet By Mouth Once A Day 20)  Alprazolam 1 Mg Tbdp (Alprazolam) .... 1/2 Tablet Twice A Day As Needed For Stress Only 21)  Ambien 10 Mg Tabs (Zolpidem  Tartrate) .... One Tab By Mouth At Night As Needed Insomnia 22)  Oxycodone-Acetaminophen 5-325 Mg Tabs (Oxycodone-Acetaminophen) .Marland Kitchen.. 1 Every 6 Hours As Needed Pain 23)  Oxygen .... Increase To 3 L/min As Needed Shortness of Breath W/ Activity 24)  Imitrex 50 Mg Tabs (Sumatriptan Succinate) .Marland Kitchen.. 1 At Onset of Headache 25)  Proair Hfa 108 (90 Base) Mcg/act  Aers (Albuterol Sulfate) .... 2 Puffs Every 4 Hours As Needed 26)  Epi Pen .... Use As Needed Severe Allergic Reaction 27)  Allergy Vaccine 1:10 Go (W-E) .Marland Kitchen.. 1 Injection Once Weekly  Allergies: 1)  ! Sulfadiazine (Sulfadiazine) 2)  ! Oxycontin  Physical Exam  General:  Well-developed,well-nourished,in no acute distress; alert,appropriate and cooperative throughout examination, obese. Head:  Normocephalic and atraumatic without obvious abnormalities. No apparent alopecia or balding. Sinuses NT. Eyes:  Conjunctiva clear bilaterally.  Ears:  External ear exam shows no significant lesions or deformities.  Otoscopic examination reveals clear canals, tympanic membranes are intact bilaterally without bulging, retraction, inflammation or discharge. Hearing is grossly normal bilaterally. Nose:  External nasal examination shows no deformity or inflammation. Nasal mucosa are pink and moist without lesions or exudates. Mildly inflamed, no active bleeding site seen, N/C in place w/O2. Mouth:  Oral mucosa and oropharynx without lesions or exudates.  Teeth in good repair. Neck:  No deformities, masses, or tenderness noted. Lungs:  Normal respiratory effort, chest expands symmetrically. Lungs are clear to auscultation, no crackles but slight wheezing. Slightly distant breath sounds. Heart:  Normal rate and regular rhythm. S1 and S2 normal without gallop, murmur, click, rub or other extra sounds.   Impression & Recommendations:  Problem # 1:  PAIN, CHRONIC NEC (ICD-338.29) Assessment Unchanged Atr her pleading, will increase pain meds to 7.5  hydrocodone.  Tried to encourage her to give Dr Channing Mutters a chance. She will consider it.  Problem # 2:  HYPERTENSION (ICD-401.9) Assessment: Improved Stable. Her updated medication list for this problem includes:    Lasix 20 Mg Tabs (Furosemide) .Marland Kitchen... Take 1 tablet by mouth once a day    Norvasc 2.5 Mg Tabs (Amlodipine besylate) .Marland Kitchen... Take 1 tablet by mouth once a day  BP today: 120/68 Prior BP: 116/64 (11/10/2009)  Labs Reviewed: K+: 3.4 (08/25/2009) Creat: : 0.7 (08/25/2009)   Chol: 114 (11/26/2008)   HDL: 35.60 (11/26/2008)   LDL: 60 (11/26/2008)   TG: 93.0 (11/26/2008)  Complete Medication List: 1)  Levoxyl 112 Mcg Tabs (Levothyroxine sodium) .... One tab by mouth once daily 2)  Vitamin D3 1000 Unit Caps (Cholecalciferol) .... Take 1 capsule by mouth once a day 3)  Vitamin B-12 1000 Mcg Tabs (Cyanocobalamin) .... Take 1 tablet by mouth once a day 4)  One Daily Multiple Vitamin Tabs (Multiple vitamin) .Marland Kitchen.. 1 by mouth daily 5)  Lasix 20 Mg Tabs (Furosemide) .Marland KitchenMarland KitchenMarland Kitchen  Take 1 tablet by mouth once a day 6)  Klor-con 10 10 Meq Tbcr (Potassium chloride) .... Take 1-2 tablet by mouth once a day 7)  Caltrate 600 Tabs (Calcium carbonate tabs) .Marland Kitchen.. 1 by mouth daily 8)  Oxygen  .Marland Kitchen.. 3 l/min continuously 9)  Spiriva Handihaler 18 Mcg Caps (Tiotropium bromide monohydrate) .... Inhale contents of 1 capsule once a day 10)  Advair Hfa 230-21 Mcg/act Aero (Fluticasone-salmeterol) .... Two puffs twice daily pt to stop serevent and flovent 11)  Fluticasone Propionate 50 Mcg/act Susp (Fluticasone propionate) .... 2 puffs each nostril qd and at bedtime 12)  Ranitidine Hcl 300 Mg Tabs (Ranitidine hcl) .Marland Kitchen.. 1 tablet twice a day 13)  Lyrica 50 Mg Caps (Pregabalin) .... One tab by mouth two times a day 14)  Cymbalta 60 Mg Cpep (Duloxetine hcl) .... One tab by mouth once daily 15)  Vesicare 10 Mg Tabs (Solifenacin succinate) .... Take 1 tab by mouth at bedtime 16)  Ortho-est 1.25 1.5 Mg Tabs (Estropipate) .... Take  1 tab by mouth at bedtime 17)  Medroxyprogesterone Acetate 2.5 Mg Tabs (Medroxyprogesterone acetate) .... Take 1 tab by mouth at bedtime 18)  Simvastatin 80 Mg Tabs (Simvastatin) .... One tab by mouth at night 19)  Norvasc 2.5 Mg Tabs (Amlodipine besylate) .... Take 1 tablet by mouth once a day 20)  Alprazolam 1 Mg Tbdp (Alprazolam) .... 1/2 tablet twice a day as needed for stress only 21)  Ambien 10 Mg Tabs (Zolpidem tartrate) .... One tab by mouth at night as needed insomnia 22)  Oxycodone-acetaminophen 7.5-325 Mg Tabs (Oxycodone-acetaminophen) .... One tab by mouth every 6 hrs as needed for pain. 23)  Oxygen  .... Increase to 3 l/min as needed shortness of breath w/ activity 24)  Imitrex 50 Mg Tabs (Sumatriptan succinate) .Marland Kitchen.. 1 at onset of headache 25)  Proair Hfa 108 (90 Base) Mcg/act Aers (Albuterol sulfate) .... 2 puffs every 4 hours as needed 26)  Epi Pen  .... Use as needed severe allergic reaction 27)  Allergy Vaccine 1:10 Go (w-e)  .Marland Kitchen.. 1 injection once weekly Prescriptions: OXYCODONE-ACETAMINOPHEN 7.5-325 MG TABS (OXYCODONE-ACETAMINOPHEN) one tab by mouth every 6 hrs as needed for pain.  #90 x 0   Entered and Authorized by:   Shaune Leeks MD   Signed by:   Shaune Leeks MD on 12/06/2009   Method used:   Print then Give to Patient   RxID:   [id number]   Current Allergies (reviewed today): ! SULFADIAZINE (SULFADIAZINE) ! OXYCONTIN

## 2010-08-10 NOTE — Medication Information (Signed)
Summary: Tax adviser   Imported By: Valinda Hoar 06/16/2010 11:58:44  _____________________________________________________________________  External Attachment:    Type:   Image     Comment:   External Document

## 2010-08-10 NOTE — Assessment & Plan Note (Signed)
Summary: Pulmonary OV   Copy to:  Kelly Splinter Primary Provider/Referring Provider:  Crawford Givens [facility]  CC:  5 month follow up.  Pt states she does have some SOB with exertion but overall believes breathing is doing "great."  Denies wheezing, chest tightness, and cough..  History of Present Illness: This is a 77 year old, white female with chronic obstructive lung disease and chronic bronchitic component.     August 01, 2010 10:15 AM The pt held off on any surgery on the back.  Now not much cough.  Dyspnea has been stable except with exertion Pt denies any significant sore throat, nasal congestion or excess secretions, fever, chills, sweats, unintended weight loss, pleurtic or exertional chest pain, orthopnea PND, or leg swelling Pt denies any increase in rescue therapy over baseline, denies waking up needing it or having any early am or nocturnal exacerbations of coughing/wheezing/or dyspnea. Pt also denies any obvious fluctuation in symptoms wiht weather or environmental change or other alleviating or aggravating factors   Asthma History    Asthma Control Assessment:    Age range: 12+ years    Symptoms: 0-2 days/week    Nighttime Awakenings: 0-2/month    Interferes w/ normal activity: some limitations    SABA use (not for EIB): 0-2 days/week    ATAQ questionnaire: 0    FEV1: 1.10 liters (today)    Exacerbations requiring oral systemic steroids: 0-1/year    Asthma Control Assessment: Not Well Controlled   Preventive Screening-Counseling & Management  Alcohol-Tobacco     Alcohol drinks/day: 0     Smoking Status: quit > 6 months     Year Quit: 1987     Pack years: 35     Passive Smoke Exposure: no  Current Medications (verified): 1)  Levoxyl 112 Mcg Tabs (Levothyroxine Sodium) .... One Tab By Mouth Once Daily 2)  Vitamin D3 1000 Unit Caps (Cholecalciferol) .... Take 1 Capsule By Mouth Once A Day 3)  Vitamin B-12 1000 Mcg Tabs (Cyanocobalamin) ....  Take 1 Tablet By Mouth Once A Day 4)  One Daily Multiple Vitamin   Tabs (Multiple Vitamin) .Marland Kitchen.. 1 By Mouth Daily 5)  Klor-Con 10 10 Meq Tbcr (Potassium Chloride) .... Take 1-2 Tablet By Mouth Once A Day 6)  Caltrate 600   Tabs (Calcium Carbonate Tabs) .Marland Kitchen.. 1 By Mouth Daily 7)  Oxygen .Marland Kitchen.. 3 L/min Continuously 8)  Spiriva Handihaler 18 Mcg  Caps (Tiotropium Bromide Monohydrate) .... Inhale Contents of 1 Capsule Once A Day 9)  Advair Hfa 230-21 Mcg/act  Aero (Fluticasone-Salmeterol) .... Two Puffs Twice Daily 10)  Fluticasone Propionate 50 Mcg/act Susp (Fluticasone Propionate) .... 2 Puffs Each Nostril Qd and At Bedtime 11)  Ranitidine Hcl 300 Mg Tabs (Ranitidine Hcl) .Marland Kitchen.. 1 Tablet Twice A Day 12)  Lyrica 50 Mg Caps (Pregabalin) .... One Tab By Mouth Two Times A Day 13)  Cymbalta 60 Mg Cpep (Duloxetine Hcl) .... One Tab By Mouth Once Daily 14)  Norvasc 2.5 Mg Tabs (Amlodipine Besylate) .... Take 1 Tablet By Mouth Once A Day 15)  Alprazolam 1 Mg Tbdp (Alprazolam) .... 1/2 Tablet Twice A Day As Needed For Stress Only 16)  Ambien 10 Mg Tabs (Zolpidem Tartrate) .... One Tab By Mouth At Night As Needed Insomnia 17)  Imitrex 50 Mg Tabs (Sumatriptan Succinate) .Marland Kitchen.. 1 At Onset of Headache 18)  Proair Hfa 108 (90 Base) Mcg/act  Aers (Albuterol Sulfate) .... 2 Puffs Every 4 Hours As Needed 19)  Percocet 7.5-325 Mg Tabs (  Oxycodone-Acetaminophen) .Marland Kitchen.. 1 By Mouth 3-4 Times A Day For Pain 20)  Omeprazole 20 Mg Cpdr (Omeprazole) .... One By Mouth Two Times A Day 21)  Lidoderm 5 % Ptch (Lidocaine) .... Apply 1-2 Patches On Your Lower Back For 12 Hours For Pain.  Allergies (verified): 1)  ! Sulfadiazine (Sulfadiazine) 2)  ! Oxycontin  Past History:  Past medical, surgical, family and social histories (including risk factors) reviewed, and no changes noted (except as noted below).  Past Medical History: OSTEOPOROSIS (ICD-733.00) ALLERGIC RHINITIS (ICD-477.9) PAIN, CHRONIC NEC (ICD-338.29) HYPERGLYCEMIA  (ICD-790.6) INSOMNIA (ICD-780.52) ASTHMATIC BRONCHITIS, WITH COPD (ICD-466.0) POSTMENOPAUSAL ON HORMONE REPLACEMENT THERAPY (ICD-V07.4) DEGENERATIVE DISC DISEASE (ICD-722.6)    -lumbar spine disease  ALLERGY, ENVIRONMENTAL (ICD-995.3) COMMON MIGRAINE (ICD-346.10) FIBROMYALGIA (ICD-729.1) HYPERTENSION (ICD-401.9) HYPERLIPIDEMIA (ICD-272.4) GERD (ICD-530.81) DIVERTICULOSIS, COLON (ICD-562.10) HYPOTHYROIDISM NOS (ICD-244.9) chronic back pain s/p pain clinic eval, injection  Past Surgical History: Reviewed history from 05/30/2009 and no changes required.                      NSVD X2  ([facility]) 1972             L Breast Cystectomy 1986             Back Surgery  DDD  Spinal Fusion 11/02/02         Colonoscopy  int. hemorrhoids, divertics    2014 05/09/04          Persantine Cardiolite - low risk study EF 76% 5/17-5/18/06 Chest pain VQ low prob, large fibroid 11/22/04          CT chest V/Q scan nml. 07/23/05          Persantine Cardiolite - inf. wall thinning (-) ischemia EF 77% 09/14/05            Right shoulder replacement ([facility]) 5/17-5/18/07 [facility] R/O'd 01/16/07         MRI  Multi lvl Arthropathy Enlarged Uterus 05/29/07       DEXA Osteoporotic Fem Neck L/S 10/08/07           Dobutamine Myoview nml  11/03/07         Arthroscopy L Knee Partial Medial Meniscectomy with MCL Sprain ( [place]) 03/10/08           Head CT Atrophy 03/11/08           Head MRI Remote Lacunar Infarcts, Subcortical white matter disease   No acute infarct 9/1-03/11/08     HOSP Atered Ment Status from dementia and polypharmacy   [facility] 03/09/08           MRI/MRA No acute infarct, some atrophy mild-mod atherosclerotic change 03/10/08          CT Head no acute changes 03/10/08          Echocardiogram  EF 60% No Emboli 9/3-03/17/08    HOSP Gait D/O w/TIA (Dr Sharene Skeans)  Vertell Limber Lovenox for DVT Prophyl Early Dementia (Dr Jeanie Sewer) 10/2-10/3/09HOSP Altered Mental Status due to UTI Anxiety Depression Fibromyalgia  Family  History: Reviewed history from 05/30/2009 and no changes required. Father: Died 72 MI, HTN Mother: Died 64 CHF, RAD, HTN Siblings: 1 brother died lung CA HTN, 1 brother died lobectomy, HTN, pneumonia 1 sister alive 57  lymphoma (non-Hodgkin's) breast CA, HTN 1 sister died 33 asthma (Staph pneumonia) HTN CV:  (+) father who died of MI, mother died CHF HBP:  (+) mother/father, all sibs CA:  lung CA brothers x  2, sister (+lymphoma) Stroke:  (-)  Social History: Reviewed history from 12/24/2006 and no changes required. Marital Status: Married, lives with husband (retired school principal) Children: 2 married, 4 grandsons Occupation: Housewife Former Smoker, 1-1/2 PPD x 20 years - 34 PYH, quit 15 or more years ago  ~87 Alcohol use-no Drug use-no  Review of Systems       The patient complains of shortness of breath with activity and non-productive cough.  The patient denies shortness of breath at rest, productive cough, coughing up blood, chest pain, irregular heartbeats, acid heartburn, indigestion, loss of appetite, weight change, abdominal pain, difficulty swallowing, sore throat, tooth/dental problems, headaches, nasal congestion/difficulty breathing through nose, sneezing, itching, ear ache, anxiety, depression, hand/feet swelling, joint stiffness or pain, rash, change in color of mucus, and fever.    Vital Signs:  Patient profile:   77 year old female Height:      61 inches Weight:      211.13 pounds BMI:     40.04 O2 Sat:      97 % on 3 L/minpulsed Temp:     97.6 degrees F oral Pulse rate:   79 / minute BP sitting:   112 / 72  (left arm) Cuff size:   large  Vitals Entered By: Gweneth Dimitri RN (August 01, 2010 10:05 AM)  O2 Flow:  3 L/minpulsed CC: 5 month follow up.  Pt states she does have some SOB with exertion but overall believes breathing is doing "great."  Denies wheezing, chest tightness, cough. Comments Medications reviewed with patient Daytime contact number  verified with patient. Gweneth Dimitri RN  August 01, 2010 10:06 AM    Physical Exam  Additional Exam:  General: A/Ox3; pleasant and cooperative, NAD,supplemental oxygen , pleasant lady SKIN: no rash, lesions NODES: no lymphadenopathy HEENT: [place]/AT, EOM- WNL, Conjuctivae- clear, PERRLA, TM-WNL, Nose- clear, not overdried, Throat- clear and wnl NECK: Supple w/ fair ROM, JVD- none, normal carotid impulses w/o bruits Thyroid- normal to palpation CHEST:distant  BS w/ no wheezing.  HEART: RRR, no m/g/r heard ABDOMEN: Soft and nl;  GNF:AOZH, nl pulses, no edema  NEURO: Grossly intact to observation      Pre-Spirometry FEV1    Value: 1.10 L     Impression & Recommendations:  Problem # 1:  ASTHMATIC BRONCHITIS, WITH COPD (ICD-466.0) Assessment Unchanged stable AB /copd, oxygen dependent plan No change in inhaled medications.   Maintain treatment program as currently prescribed. cont oxygen as Rx Her updated medication list for this problem includes:    Spiriva Handihaler 18 Mcg Caps (Tiotropium bromide monohydrate) ..... Inhale contents of 1 capsule once a day    Advair Hfa 230-21 Mcg/act Aero (Fluticasone-salmeterol) .Marland Kitchen..Marland Kitchen Two puffs twice daily    Proair Hfa 108 (90 Base) Mcg/act Aers (Albuterol sulfate) .Marland Kitchen... 2 puffs every 4 hours as needed  Complete Medication List: 1)  Levoxyl 112 Mcg Tabs (Levothyroxine sodium) .... One tab by mouth once daily 2)  Vitamin D3 1000 Unit Caps (Cholecalciferol) .... Take 1 capsule by mouth once a day 3)  Vitamin B-12 1000 Mcg Tabs (Cyanocobalamin) .... Take 1 tablet by mouth once a day 4)  One Daily Multiple Vitamin Tabs (Multiple vitamin) .Marland Kitchen.. 1 by mouth daily 5)  Klor-con 10 10 Meq Tbcr (Potassium chloride) .... Take 1-2 tablet by mouth once a day 6)  Caltrate 600 Tabs (Calcium carbonate tabs) .Marland Kitchen.. 1 by mouth daily 7)  Oxygen  .Marland Kitchen.. 3 l/min continuously 8)  Spiriva Handihaler 18 Mcg Caps (Tiotropium  bromide monohydrate) .... Inhale contents of 1  capsule once a day 9)  Advair Hfa 230-21 Mcg/act Aero (Fluticasone-salmeterol) .... Two puffs twice daily 10)  Fluticasone Propionate 50 Mcg/act Susp (Fluticasone propionate) .... 2 puffs each nostril qd and at bedtime 11)  Ranitidine Hcl 300 Mg Tabs (Ranitidine hcl) .Marland Kitchen.. 1 tablet twice a day 12)  Lyrica 50 Mg Caps (Pregabalin) .... One tab by mouth two times a day 13)  Cymbalta 60 Mg Cpep (Duloxetine hcl) .... One tab by mouth once daily 14)  Norvasc 2.5 Mg Tabs (Amlodipine besylate) .... Take 1 tablet by mouth once a day 15)  Alprazolam 1 Mg Tbdp (Alprazolam) .... 1/2 tablet twice a day as needed for stress only 16)  Ambien 10 Mg Tabs (Zolpidem tartrate) .... One tab by mouth at night as needed insomnia 17)  Imitrex 50 Mg Tabs (Sumatriptan succinate) .Marland Kitchen.. 1 at onset of headache 18)  Proair Hfa 108 (90 Base) Mcg/act Aers (Albuterol sulfate) .... 2 puffs every 4 hours as needed 19)  Percocet 7.5-325 Mg Tabs (Oxycodone-acetaminophen) .Marland Kitchen.. 1 by mouth 3-4 times a day for pain 20)  Omeprazole 20 Mg Cpdr (Omeprazole) .... One by mouth two times a day 21)  Lidoderm 5 % Ptch (Lidocaine) .... Apply 1-2 patches on your lower back for 12 hours for pain.  Other Orders: Est. Patient Level III (43329)  Patient Instructions: 1)  No change in medications 2)  Return in    4-5      months

## 2010-08-10 NOTE — Progress Notes (Signed)
Summary: Lyrica, and Alprazolam refills  Phone Note Refill Request Call back at [phone] Message from:  Patient on July 27, 2009 3:04 PM  Refills Requested: Medication #1:  LYRICA 50 MG CAPS one tab by mouth two times a day  Medication #2:  ALPRAZOLAM 1 MG TBDP 1/2 tablet twice a day for stress only Pt request three month supply for each med be faxed to [facility]. Pt also request  refill of Ambien or Zolpidem 10mg  take one at bedtime. I did not find this med on medication list. Pt said we usually fax to [facility] [phone]. Please advise.    Method Requested: Fax to Mail Away Pharmacy Initial call taken by: Lewanda Rife LPN,  July 27, 2009 3:07 PM  Follow-up for Phone Call        I prefer not to have her start Ambien again. Please try to do sleep hygiene techniques, regular routine, avoid stimulation at night, sleep only in the bedroom, etc. Follow-up by: Shaune Leeks MD,  July 27, 2009 4:56 PM  Additional Follow-up for Phone Call Additional follow up Details #1::        Patient notified as instructed by telephone. Rx faxed to [facility] as instructed. Additional Follow-up by: Sydell Axon LPN,  July 27, 2009 5:11 PM    Prescriptions: LYRICA 50 MG CAPS (PREGABALIN) one tab by mouth two times a day  #180 x 3   Entered and Authorized by:   Shaune Leeks MD   Signed by:   Shaune Leeks MD on 07/27/2009   Method used:   Printed then faxed to ...       [facility]* (mail-order)             ,          Ph: [phone]       Fax: [phone]   RxID:   [id number] ALPRAZOLAM 1 MG TBDP (ALPRAZOLAM) 1/2 tablet twice a day for stress only  #90 x 0   Entered and Authorized by:   Shaune Leeks MD   Signed by:   Shaune Leeks MD on 07/27/2009   Method used:   Printed then faxed to ...       [facility]* (mail-order)             ,          Ph: [phone]       Fax: [phone]   RxID:   [id number]

## 2010-08-10 NOTE — Assessment & Plan Note (Signed)
Summary: cold/rbh   Vital Signs:  Patient profile:   77 year old female Weight:      217 pounds O2 Sat:      95 % on 3 L/min Temp:     98.4 degrees F oral Pulse rate:   72 / minute Pulse rhythm:   regular BP sitting:   130 / 70  (left arm) Cuff size:   large  Vitals Entered By: Sydell Axon LPN (July 13, 2009 2:32 PM)  O2 Flow:  3 L/min CC: Cold, runny nose and cough  4 Allergies: 1)  ! Sulfadiazine (Sulfadiazine) 2)  ! Oxycontin  Physical Exam  General:  Well-developed,well-nourished,in no acute distress; alert,appropriate and cooperative throughout examination, obese, mildly congested and minimally hoarse. Head:  Normocephalic and atraumatic without obvious abnormalities. No apparent alopecia or balding. Sinuses NT. Eyes:  Conjunctiva clear bilaterally.  Ears:  External ear exam shows no significant lesions or deformities.  Otoscopic examination reveals clear canals, tympanic membranes are intact bilaterally without bulging, retraction, inflammation or discharge. Hearing is grossly normal bilaterally. Nose:  External nasal examination shows no deformity or inflammation. Nasal mucosa are pink and moist without lesions or exudates. Mildly inflamed, no active bleeding site seen, N/C in place w/O2. Mouth:  Oral mucosa and oropharynx without lesions or exudates.  Teeth in good repair. Neck:  No deformities, masses, or tenderness noted. Lungs:  Normal respiratory effort, chest expands symmetrically. Lungs are clear to auscultation, no crackles or wheezes. Slightly distant breath sounds. Heart:  Normal rate and regular rhythm. S1 and S2 normal without gallop, murmur, click, rub or other extra sounds.   Impression & Recommendations:  Problem # 1:  ASTHMATIC BRONCHITIS, WITH COPD (ICD-466.0) Assessment Deteriorated  Acute Exacerbation. Use Prednisone and Biaxin. Her updated medication list for this problem includes:    Proair Hfa 108 (90 Base) Mcg/act Aers (Albuterol sulfate)  ..... Use one to two puffs every 4 hours as needed    Flovent Hfa 220 Mcg/act Aero (Fluticasone propionate  hfa) ..... Inhale 2 puff as directed twice a day    Spiriva Handihaler 18 Mcg Caps (Tiotropium bromide monohydrate) ..... Inhale 1 once a day    Serevent Diskus 50 Mcg/dose Aepb (Salmeterol xinafoate) ..... Inhale 1 puff as directed twice a day    Clarithromycin 500 Mg Tabs (Clarithromycin) ..... One tab by mouth two times a day  Take antibiotics and other medications as directed. Encouraged to push clear liquids, get enough rest, and take acetaminophen as needed. To be seen in 5-7 days if no improvement, sooner if worse.  Complete Medication List: 1)  Proair Hfa 108 (90 Base) Mcg/act Aers (Albuterol sulfate) .... Use one to two puffs every 4 hours as needed 2)  Flovent Hfa 220 Mcg/act Aero (Fluticasone propionate  hfa) .... Inhale 2 puff as directed twice a day 3)  Spiriva Handihaler 18 Mcg Caps (Tiotropium bromide monohydrate) .... Inhale 1 once a day 4)  Serevent Diskus 50 Mcg/dose Aepb (Salmeterol xinafoate) .... Inhale 1 puff as directed twice a day 5)  Lasix 20 Mg Tabs (Furosemide) .... Take 1 tablet by mouth once a day 6)  Norvasc 2.5 Mg Tabs (Amlodipine besylate) .... Take 1 tablet by mouth once a day 7)  Klor-con 10 10 Meq Tbcr (Potassium chloride) .... One a day 8)  Ocuvite Tabs (Multiple vitamins-minerals) .Marland Kitchen.. 1 by mouth daily 9)  One Daily Multiple Vitamin Tabs (Multiple vitamin) .Marland Kitchen.. 1 by mouth daily 10)  Caltrate 600 Tabs (Calcium carbonate tabs) .Marland KitchenMarland KitchenMarland Kitchen  1 by mouth daily 11)  O2 @ 2-3 L/min Via Nasal Cannula  12)  Imitrex 100 Mg Tabs (Sumatriptan succinate) .... As needed 13)  Epi Pen  .... As needed 14)  Allergy Vaccine 1:10 Go (w-e)  15)  Simvastatin 80 Mg Tabs (Simvastatin) .... One tab by mouth at night 16)  Levoxyl 112 Mcg Tabs (Levothyroxine sodium) .... One tab by mouth once daily 17)  Cymbalta 60 Mg Cpep (Duloxetine hcl) .... One tab by mouth once daily 18)   Celebrex 200 Mg Caps (Celecoxib) .Marland Kitchen.. 1 daily by mouth 19)  Ranitidine Hcl 300 Mg Tabs (Ranitidine hcl) .Marland Kitchen.. 1 tablet twice a day 20)  Alprazolam 1 Mg Tbdp (Alprazolam) .... 1/2 tablet twice a day for stress only 21)  Lyrica 50 Mg Caps (Pregabalin) .... One tab by mouth two times a day 22)  Vesicare 10 Mg Tabs (Solifenacin succinate) .... Onre tab by mouth qd 23)  Oxycodone Hcl 10 Mg Tabs (Oxycodone hcl) .... One tab by mouth three  times a day as needed back pain. take sparingly. 24)  Ipratropium Bromide 0.06 % Soln (Ipratropium bromide) .Marland Kitchen.. 1-2 spray each nostril three times a day as needed runny nose 25)  Prednisone 10 Mg Tabs (Prednisone) .... 4 tabs by mouth every am for five days the decrease by 1/2 tab every 2 days until on 1/2 tab and do that for 6 days. 26)  Clarithromycin 500 Mg Tabs (Clarithromycin) .... One tab by mouth two times a day  Patient Instructions: 1)  Take Ab 2)  Take Prednisone 3)  Take Guaifenesin by [facility] or [facility] and getting MUCOUS RELIEF EXPECTORANT (400mg ), take 11/2 tabs by mouth AM and NOON. 4)  Drink lots of fluids anytime taking Guaifenesin.  5)  Take Tyl regularly. 6)  Keep lozenge in mouth regularly Prescriptions: CLARITHROMYCIN 500 MG TABS (CLARITHROMYCIN) one tab by mouth two times a day  #20 x 0   Entered and Authorized by:   Shaune Leeks MD   Signed by:   Shaune Leeks MD on 07/13/2009   Method used:   [facility]        [facility] [address] [facility] [address] [phone]* (retail)       [address]       [place], [place]  [address]       Ph: [phone] or [phone]       Fax: [phone]   RxID:   [id number] PREDNISONE 10 MG TABS (PREDNISONE) 4 tabs by mouth every AM for five days the decrease by 1/2 tab every 2 days until on 1/2 tab and do that for 6 days.  #50 x 0   Entered and Authorized by:   Shaune Leeks MD   Signed by:   Shaune Leeks MD on 07/13/2009    Method used:   [facility]ically to        [facility] [phone]* (retail)       [address]       [place], [place]  [address]       Ph: [phone] or [phone]       Fax: [phone]   RxID:   [id number]   Current Allergies (reviewed today): ! SULFADIAZINE (SULFADIAZINE) ! OXYCONTIN  Appended Document: cold/rbh     Clinical Lists Changes  Observations: Added new observation of HPI: Pt here with  husband for congestion. She has had 3 weeks of runny nose and productive cough brownish in color and blood in the mucous  with blowing her nose and h/a in the frontal area.. She has had no o ST or fever, feeling tired and needing to sleep all the time. No orther complaints, taking all meds w/o problem. (07/13/2009 16:29) Added new observation of PRIMARY MD: Dr. Hetty Ely (07/13/2009 16:29)        History of Present Illness: Pt here with husband for congestion. She has had 3 weeks of runny nose and productive cough brownish in color and blood in the mucous  with blowing her nose and h/a in the frontal area.. She has had no o ST or fever, feeling tired and needing to sleep all the time. No orther complaints, taking all meds w/o problem.

## 2010-08-10 NOTE — Letter (Signed)
Summary: [facility] - DISCHARGE SUMMARY / DR. Earna Coder SWARTZ  [facility] - DISCHARGE SUMMARY / DR. Faith Rogue   Imported By: Carin Primrose 03/26/2008 16:44:58  _____________________________________________________________________  External Attachment:    Type:   Image     Comment:   External Document  Appended Document:  - DISCHARGE SUMMARY / DR. Faith Rogue     Clinical Lists Changes  Observations: Added new observation of PAST SURG HX:                      NSVD X2  (Daughters) 1972             L Breast Cystectomy 1986             Back Surgery  DDD  Spinal Fusion 11/02/02         Colonoscopy  int. hemorrhoids, divertics 05/09/04          Persantine Cardiolite - low risk study EF 76% 5/17-5/18/06 Chest pain VQ low prob, large fibroid 11/22/04          CT chest V/Q scan nml. 07/23/05          Persantine Cardiolite - inf. wall thinning (-) ischemia EF 77% 09/14/05            Right shoulder replacement ([facility]) 5/17-5/18/07 [facility] R/O'd 01/16/07         MRI  Multi lvl Arthropathy Enlarged Uterus 05/29/07       DEXA Osteoporotic Fem Neck L/S 10/08/07           Dobutamine Myoview nml  11/03/07         Arthroscopy L Knee Partial Medial Meniscectomy with MCL Sprain ( [facility]) 03/10/08           Head CT Atrophy 03/11/08           Head MRI Remote Lacunar Infarcts, Subcortical white matter disease   No acute infarct 9/1-03/11/08     HOSP Atered Ment Status from dementia and polypharmacy   [facility] 03/09/08           MRI/MRA No acute infarct, some atrophy mild-mod atherosclerotic change 03/10/08          CT Head no acute changes 03/10/08          Echocardiogram  EF 60% No Emboli 9/3-03/17/08    HOSP Gait D/O w/TIA (Dr Sharene Skeans)  Vertell Limber Lovenox for DVT Prophyl Early Dementia (Dr Jeanie Sewer) (03/29/2008 7:24)       Past Surgical History:                         NSVD X2  (Daughters)    1972             L Breast Cystectomy    1986             Back Surgery  DDD  Spinal Fusion   11/02/02         Colonoscopy  int. hemorrhoids, divertics    05/09/04          Persantine Cardiolite - low risk study EF 76%    5/17-5/18/06 Chest pain VQ low prob, large fibroid    11/22/04          CT chest V/Q scan nml.    07/23/05          Persantine Cardiolite - inf. wall thinning (-) ischemia EF 77%    09/14/05  Right shoulder replacement ([facility])    5/17-5/18/07 [facility] R/O'd    01/16/07         MRI  Multi lvl Arthropathy Enlarged Uterus    05/29/07       DEXA Osteoporotic Fem Neck L/S    10/08/07           Dobutamine Myoview nml     11/03/07         Arthroscopy L Knee Partial Medial Meniscectomy with MCL Sprain ( [facility])    03/10/08           Head CT Atrophy    03/11/08           Head MRI Remote Lacunar Infarcts, Subcortical white matter disease   No acute infarct    9/1-03/11/08     HOSP Atered Ment Status from dementia and polypharmacy   [facility]    03/09/08           MRI/MRA No acute infarct, some atrophy mild-mod atherosclerotic change    03/10/08          CT Head no acute changes    03/10/08          Echocardiogram  EF 60% No Emboli    9/3-03/17/08    HOSP Gait D/O w/TIA (Dr Sharene Skeans)  Vertell Limber Lovenox for DVT Prophyl Early Dementia (Dr Jeanie Sewer)

## 2010-08-10 NOTE — Assessment & Plan Note (Signed)
Summary: Pulmonary OV   Copy to:  Kelly Splinter Primary Provider/Referring Provider:  Dr. Hetty Ely  CC:  1 month follow up.  states breathing is the same - no better or worse.  States she has wheezing that "comes and goes."  Denies chest tightness and cough.  .  History of Present Illness: This is a 77 year old, white female with chronic obstructive lung disease and chronic bronchitic component.    August 31, 2009 10:26 AM Last ov 8/10: Seen Dr young regarding allergy eval. Now notes wheezing for three weeks. Notes more cough at night,  no mucous,  notes some pndrip,  no f/c/s. The pt notes more heartburn esp at night .   September 07, 2009--Presents for follow up and med review. Last visit w/ COPD flare tx w/ steroid taper . She is feeling much better. We reviewed all her meds today and completed a med calendar. Her wheezing and cough are improved, energy level is returning. Denies chest pain, dyspnea, orthopnea, hemoptysis, fever, n/v/d, edema, headache.   October 03, 2009 2:38 PM Pt is stable with dyspnea.  No new issues. Wants her laba/ics combined in one HFA device. Pt denies any significant sore throat, nasal congestion or excess secretions, fever, chills, sweats, unintended weight loss, pleurtic or exertional chest pain, orthopnea PND, or leg swelling Pt denies any increase in rescue therapy over baseline, denies waking up needing it or having any early am or nocturnal exacerbations of coughing/wheezing/or dyspnea.   Preventive Screening-Counseling & Management  Alcohol-Tobacco     Smoking Status: quit > 6 months  Current Medications (verified): 1)  Levoxyl 112 Mcg Tabs (Levothyroxine Sodium) .... One Tab By Mouth Once Daily 2)  Vitamin D3 1000 Unit Caps (Cholecalciferol) .... Take 1 Capsule By Mouth Once A Day 3)  Vitamin B-12 1000 Mcg Tabs (Cyanocobalamin) .... Take 1 Tablet By Mouth Once A Day 4)  One Daily Multiple Vitamin   Tabs (Multiple Vitamin) .Marland Kitchen.. 1 By  Mouth Daily 5)  Lasix 20 Mg Tabs (Furosemide) .... Take 1 Tablet By Mouth Once A Day 6)  Klor-Con 10 10 Meq Tbcr (Potassium Chloride) .... Take 1 Tablet By Mouth Once A Day 7)  Caltrate 600   Tabs (Calcium Carbonate Tabs) .Marland Kitchen.. 1 By Mouth Daily 8)  Oxygen .Marland Kitchen.. 3 L/min Continuously 9)  Spiriva Handihaler 18 Mcg  Caps (Tiotropium Bromide Monohydrate) .... Inhale Contents of 1 Capsule Once A Day 10)  Serevent Diskus 50 Mcg/dose Aepb (Salmeterol Xinafoate) .... Inhale 1 Puff Two Times A Day 11)  Flovent Hfa 220 Mcg/act  Aero (Fluticasone Propionate  Hfa) .... Inhale 2 Puffs Two Times A Day 12)  Fluticasone Propionate 50 Mcg/act Susp (Fluticasone Propionate) .... 2 Puffs Each Nostril Qd and At Bedtime 13)  Ranitidine Hcl 300 Mg Tabs (Ranitidine Hcl) .Marland Kitchen.. 1 Tablet Twice A Day 14)  Lyrica 50 Mg Caps (Pregabalin) .... One Tab By Mouth Two Times A Day 15)  Cymbalta 60 Mg Cpep (Duloxetine Hcl) .... One Tab By Mouth Once Daily 16)  Vesicare 10 Mg Tabs (Solifenacin Succinate) .... Take 1 Tab By Mouth At Bedtime 17)  Ortho-Est 1.25 1.5 Mg Tabs (Estropipate) .... Take 1 Tab By Mouth At Bedtime 18)  Medroxyprogesterone Acetate 2.5 Mg Tabs (Medroxyprogesterone Acetate) .... Take 1 Tab By Mouth At Bedtime 19)  Simvastatin 80 Mg  Tabs (Simvastatin) .... One Tab By Mouth At Night 20)  Norvasc 2.5 Mg Tabs (Amlodipine Besylate) .... Take 1 Tablet By Mouth Once A Day  21)  Levsin 0.125 Mg Tabs (Hyoscyamine Sulfate) .... Take 1 Tablet By Mouth Three Times A Day As Needed Abdominal Cramping 22)  Alprazolam 1 Mg Tbdp (Alprazolam) .... 1/2 Tablet Twice A Day As Needed For Stress Only 23)  Ambien 10 Mg Tabs (Zolpidem Tartrate) .... One Tab By Mouth At Night As Needed Insomnia 24)  Oxycodone-Acetaminophen 5-325 Mg Tabs (Oxycodone-Acetaminophen) .Marland Kitchen.. 1 Every 6 Hours As Needed Pain 25)  Oxygen .... Increase To 3 L/min As Needed Shortness of Breath W/ Activity 26)  Imitrex 50 Mg Tabs (Sumatriptan Succinate) .Marland Kitchen.. 1 At Onset of  Headache 27)  Proair Hfa 108 (90 Base) Mcg/act  Aers (Albuterol Sulfate) .... 2 Puffs Every 4 Hours As Needed 28)  Epi Pen .... Use As Needed Severe Allergic Reaction 29)  Allergy Vaccine 1:10 Go (W-E) .Marland Kitchen.. 1 Injection Once Weekly  Allergies (verified): 1)  ! Sulfadiazine (Sulfadiazine) 2)  ! Oxycontin  Past History:  Past medical, surgical, family and social histories (including risk factors) reviewed, and no changes noted (except as noted below).  Past Medical History: OSTEOPOROSIS (ICD-733.00) BRONCHITIS-ACUTE (ICD-466.0) ALLERGIC RHINITIS (ICD-477.9) SCREENING FOR MALIGNANNT NEOPLASM, SITE NEC (ICD-V76.49) PAIN, CHRONIC NEC (ICD-338.29) HYPERGLYCEMIA (ICD-790.6) INSOMNIA (ICD-780.52) ASTHMATIC BRONCHITIS, WITH COPD (ICD-466.0) POSTMENOPAUSAL ON HORMONE REPLACEMENT THERAPY (ICD-V07.4) DEGENERATIVE DISC DISEASE (ICD-722.6) REACTIVE AIRWAY DISEASE (ICD-493.90) ALLERGY, ENVIRONMENTAL (ICD-995.3) COMMON MIGRAINE (ICD-346.10) FIBROMYALGIA (ICD-729.1) HYPERTENSION (ICD-401.9) HYPERLIPIDEMIA (ICD-272.4) GERD (ICD-530.81) DIVERTICULOSIS, COLON (ICD-562.10) HYPOTHYROIDISM NOS (ICD-244.9) Hypertension  Past Surgical History: Reviewed history from 05/30/2009 and no changes required.                      NSVD X2  (Daughters) 1972             L Breast Cystectomy 1986             Back Surgery  DDD  Spinal Fusion 11/02/02         Colonoscopy  int. hemorrhoids, divertics    2014 05/09/04          Persantine Cardiolite - low risk study EF 76% 5/17-5/18/06 Chest pain VQ low prob, large fibroid 11/22/04          CT chest V/Q scan nml. 07/23/05          Persantine Cardiolite - inf. wall thinning (-) ischemia EF 77% 09/14/05            Right shoulder replacement ([facility]) 5/17-5/18/07 [facility] R/O'd 01/16/07         MRI  Multi lvl Arthropathy Enlarged Uterus 05/29/07       DEXA Osteoporotic Fem Neck L/S 10/08/07           Dobutamine Myoview nml  11/03/07         Arthroscopy L Knee Partial Medial  Meniscectomy with MCL Sprain ( [facility]) 03/10/08           Head CT Atrophy 03/11/08           Head MRI Remote Lacunar Infarcts, Subcortical white matter disease   No acute infarct 9/1-03/11/08     HOSP Atered Ment Status from dementia and polypharmacy   [facility] 03/09/08           MRI/MRA No acute infarct, some atrophy mild-mod atherosclerotic change 03/10/08          CT Head no acute changes 03/10/08          Echocardiogram  EF 60% No Emboli 9/3-03/17/08    HOSP Gait D/O w/TIA (  Dr Sharene Skeans)  Vertell Limber Lovenox for DVT Prophyl Early Dementia (Dr Jeanie Sewer) 10/2-10/3/09HOSP Altered Mental Status due to UTI Anxiety Depression Fibromyalgia  Family History: Reviewed history from 05/30/2009 and no changes required. Father: Died 21 MI, HTN Mother: Died 80 CHF, RAD, HTN Siblings: 1 brother died lung CA HTN, 1 brother died lobectomy, HTN, pneumonia 1 sister alive 53  lymphoma (non-Hodgkin's) breast CA, HTN 1 sister died 21 asthma (Staph pneumonia) HTN CV:  (+) father who died of MI, mother died CHF HBP:  (+) mother/father, all sibs CA:  lung CA brothers x 2, sister (+lymphoma) Stroke:  (-)  Social History: Reviewed history from 12/24/2006 and no changes required. Marital Status: Married, lives with husband (retired school principal) Children: 2 married, 4 grandsons Occupation: Housewife Former Smoker, 1-1/2 PPD x 20 years - 35 PYH, quit 15 or more years ago  ~87 Alcohol use-no Drug use-no  Review of Systems       The patient complains of shortness of breath with activity.  The patient denies shortness of breath at rest, productive cough, non-productive cough, coughing up blood, chest pain, irregular heartbeats, acid heartburn, indigestion, loss of appetite, weight change, abdominal pain, difficulty swallowing, sore throat, tooth/dental problems, headaches, nasal congestion/difficulty breathing through nose, sneezing, itching, ear ache, anxiety, depression, hand/feet swelling, joint stiffness or  pain, rash, change in color of mucus, and fever.    Vital Signs:  Patient profile:   77 year old female Height:      61 inches Weight:      223.25 pounds BMI:     42.34 O2 Sat:      94 % on 3 L/minpulsed Temp:     97.9 degrees F oral Pulse rate:   75 / minute BP sitting:   122 / 70  (left arm) Cuff size:   large  Vitals Entered By: Gweneth Dimitri RN (October 03, 2009 2:33 PM)  O2 Flow:  3 L/minpulsed CC: 1 month follow up.  states breathing is the same - no better or worse.  States she has wheezing that "comes and goes."  Denies chest tightness and cough.   Comments Medications reviewed with patient Daytime contact number verified with patient. Gweneth Dimitri RN  October 03, 2009 2:34 PM    Physical Exam  Additional Exam:  General: A/Ox3; pleasant and cooperative, NAD,supplemental oxygen , pleasant lady SKIN: no rash, lesions NODES: no lymphadenopathy HEENT: [place]/AT, EOM- WNL, Conjuctivae- clear, PERRLA, TM-WNL, Nose- clear, not overdried, Throat- clear and wnl NECK: Supple w/ fair ROM, JVD- none, normal carotid impulses w/o bruits Thyroid- normal to palpation CHEST: coarse BS w/ no wheezing.  HEART: RRR, no m/g/r heard ABDOMEN: Soft and nl;  ZOX:WRUE, nl pulses, no edema  NEURO: Grossly intact to observation      Impression & Recommendations:  Problem # 1:  REACTIVE AIRWAY DISEASE (ICD-493.90) Assessment Improved AB with recent flare now improved plan change to advair hfa 230  two puff two times a day d/c flovent and serevent no other changes  Medications Added to Medication List This Visit: 1)  Oxygen  .Marland Kitchen.. 3 l/min continuously 2)  Advair Hfa 230-21 Mcg/act Aero (Fluticasone-salmeterol) .... Two puffs twice daily pt to stop serevent and flovent 3)  Fluticasone Propionate 50 Mcg/act Susp (Fluticasone propionate) .... 2 puffs each nostril qd and at bedtime  Complete Medication List: 1)  Levoxyl 112 Mcg Tabs (Levothyroxine sodium) .... One tab by mouth once daily 2)   Vitamin D3 1000 Unit Caps (Cholecalciferol) .... Take 1  capsule by mouth once a day 3)  Vitamin B-12 1000 Mcg Tabs (Cyanocobalamin) .... Take 1 tablet by mouth once a day 4)  One Daily Multiple Vitamin Tabs (Multiple vitamin) .Marland Kitchen.. 1 by mouth daily 5)  Lasix 20 Mg Tabs (Furosemide) .... Take 1 tablet by mouth once a day 6)  Klor-con 10 10 Meq Tbcr (Potassium chloride) .... Take 1 tablet by mouth once a day 7)  Caltrate 600 Tabs (Calcium carbonate tabs) .Marland Kitchen.. 1 by mouth daily 8)  Oxygen  .Marland Kitchen.. 3 l/min continuously 9)  Spiriva Handihaler 18 Mcg Caps (Tiotropium bromide monohydrate) .... Inhale contents of 1 capsule once a day 10)  Advair Hfa 230-21 Mcg/act Aero (Fluticasone-salmeterol) .... Two puffs twice daily pt to stop serevent and flovent 11)  Fluticasone Propionate 50 Mcg/act Susp (Fluticasone propionate) .... 2 puffs each nostril qd and at bedtime 12)  Ranitidine Hcl 300 Mg Tabs (Ranitidine hcl) .Marland Kitchen.. 1 tablet twice a day 13)  Lyrica 50 Mg Caps (Pregabalin) .... One tab by mouth two times a day 14)  Cymbalta 60 Mg Cpep (Duloxetine hcl) .... One tab by mouth once daily 15)  Vesicare 10 Mg Tabs (Solifenacin succinate) .... Take 1 tab by mouth at bedtime 16)  Ortho-est 1.25 1.5 Mg Tabs (Estropipate) .... Take 1 tab by mouth at bedtime 17)  Medroxyprogesterone Acetate 2.5 Mg Tabs (Medroxyprogesterone acetate) .... Take 1 tab by mouth at bedtime 18)  Simvastatin 80 Mg Tabs (Simvastatin) .... One tab by mouth at night 19)  Norvasc 2.5 Mg Tabs (Amlodipine besylate) .... Take 1 tablet by mouth once a day 20)  Levsin 0.125 Mg Tabs (Hyoscyamine sulfate) .... Take 1 tablet by mouth three times a day as needed abdominal cramping 21)  Alprazolam 1 Mg Tbdp (Alprazolam) .... 1/2 tablet twice a day as needed for stress only 22)  Ambien 10 Mg Tabs (Zolpidem tartrate) .... One tab by mouth at night as needed insomnia 23)  Oxycodone-acetaminophen 5-325 Mg Tabs (Oxycodone-acetaminophen) .Marland Kitchen.. 1 every 6 hours as  needed pain 24)  Oxygen  .... Increase to 3 l/min as needed shortness of breath w/ activity 25)  Imitrex 50 Mg Tabs (Sumatriptan succinate) .Marland Kitchen.. 1 at onset of headache 26)  Proair Hfa 108 (90 Base) Mcg/act Aers (Albuterol sulfate) .... 2 puffs every 4 hours as needed 27)  Epi Pen  .... Use as needed severe allergic reaction 28)  Allergy Vaccine 1:10 Go (w-e)  .Marland Kitchen.. 1 injection once weekly  Other Orders: Est. Patient Level IV (66063) HFA Instruction [phone])  Patient Instructions: 1)  Stop serevent and flovent 2)  Start Advair HFA two puff twice daily 3)  No other medication changes 4)  Return two months Prescriptions: ADVAIR HFA 230-21 MCG/ACT  AERO (FLUTICASONE-SALMETEROL) Two puffs twice daily Pt to stop serevent and flovent  #1 x 6   Entered and Authorized by:   Storm Frisk MD   Signed by:   Storm Frisk MD on 10/03/2009   Method used:   [facility]  [address] [phone]* (retail)       [address]       [place], [place]  [address]       Ph: [phone] or [phone]       Fax: [phone]   RxID:   [id number]

## 2010-08-10 NOTE — Progress Notes (Signed)
Summary: refill requests from [facility]  Phone Note Refill Request Message from:  Fax from Pharmacy  Refills Requested: Medication #1:  KLOR-CON 10 10 MEQ TBCR one a day  Medication #2:  SIMVASTATIN 80 MG  TABS one tab by mouth at night  Medication #3:  NORVASC 2.5 MG TABS Take 1 tablet by mouth once a day  Medication #4:  RANITIDINE HCL 300 MG TABS 1 tablet twice a day Furosemide 20 mg, synthroid 100 micrograms.  Faxed forms from [facility] are on your shelf.  Initial call taken by: Lowella Petties,  July 27, 2008 1:26 PM  Follow-up for Phone Call        prescribtions faxed past being signed per dr. Hetty Ely Follow-up by: Providence Crosby,  July 27, 2008 5:17 PM      Prescriptions: RANITIDINE HCL 300 MG TABS (RANITIDINE HCL) 1 tablet twice a day  #180 x 4   Entered and Authorized by:   Shaune Leeks MD   Signed by:   Shaune Leeks MD on 07/27/2008   Method used:   Printed then faxed to ...       [facility] [phone]* (retail)       [address]       [place], [place]  [address]       Ph: [phone] or [phone]       Fax: [phone]   RxID:   [id number] LEVOXYL 100 MCG  TABS (LEVOTHYROXINE SODIUM) 1 daily  #90 x 4   Entered and Authorized by:   Shaune Leeks MD   Signed by:   Shaune Leeks MD on 07/27/2008   Method used:   Printed then faxed to ...       [facility] [phone]* (retail)       [address]       [place], [place]  [address]       Ph: [phone] or [phone]       Fax: [phone]   RxID:   [id number] SIMVASTATIN 80 MG  TABS (SIMVASTATIN) one tab by mouth at night  #90 x 4   Entered and Authorized by:   Shaune Leeks MD   Signed by:   Shaune Leeks MD on 07/27/2008   Method used:   Printed then faxed to ...       [facility]  [address] [phone]* (retail)       [address]       [place], [place]  [address]       Ph: [phone] or [phone]       Fax: [phone]   RxID:   [id number] KLOR-CON 10 10 MEQ TBCR (POTASSIUM CHLORIDE) one a day  #90 x 4   Entered and Authorized by:   Shaune Leeks MD   Signed by:   Shaune Leeks MD on 07/27/2008   Method used:   Printed then faxed to ...       [facility]  [address] [phone]* (retail)       [address]       [place], [place]  [address]       Ph: [phone] or [phone]       Fax: [phone])  [phone]   RxID:   [id number] NORVASC 2.5 MG TABS (AMLODIPINE BESYLATE) Take 1 tablet by mouth once a day  #90 x 4   Entered and Authorized by:   Shaune Leeks MD   Signed by:   Shaune Leeks MD on 07/27/2008   Method used:   Printed then faxed to ...       [facility] [phone]* (retail)       [address]       [place], [place]  [address]       Ph: [phone] or [phone]       Fax: [phone]   RxID:   [id number] LASIX 20 MG TABS (FUROSEMIDE) Take 1 tablet by mouth once a day  #90 x 4   Entered and Authorized by:   Shaune Leeks MD   Signed by:   Shaune Leeks MD on 07/27/2008   Method used:   Printed then faxed to ...       [facility] [address] [phone]* (retail)       [address]       [place], [place]  [address]       Ph: [phone] or [phone]       Fax: [phone]   RxID:   [id number]

## 2010-08-10 NOTE — Miscellaneous (Signed)
Summary: Injection Orders / [facility]    Injection Orders /  Allergy    Imported By: Lennie Odor 12/06/2009 15:31:48  _____________________________________________________________________  External Attachment:    Type:   Image     Comment:   External Document

## 2010-08-10 NOTE — Assessment & Plan Note (Signed)
Summary: Pulmonary Ov     Copy to:  Kelly Splinter Primary Provider/Referring Provider:  Dr. Hetty Ely  CC:  Acute Visit.  wheezing most of the time but worse with activity, increased SOB with activity, and and "extreme tireness" x 2-3 wks.  also c/o dry cough that has gotten worse over the past 2-3 wks and is worse when laying down.  [name]  History of Present Illness: Pulmonary OV This is a 77 year old, white female with chronic obstructive lung disease and chronic bronchitic component.    August 31, 2009 10:26 AM Last ov 8/10: Seen Dr young regarding allergy eval. Now notes wheezing for three weeks. Notes more cough at night,  no mucous,  notes some pndrip,  no f/c/s. The pt notes more heartburn esp at night .   Asthma History    Initial Asthma Severity Rating:    Age range: 12+ years    Symptoms: daily    Nighttime Awakenings: 0-2/month    Interferes w/ normal activity: some limitations    SABA use (not for EIB): several times per day    Exacerbations requiring oral systemic steroids: 0-1/year    Asthma Severity Assessment: Severe Persistent   Preventive Screening-Counseling & Management  Alcohol-Tobacco     Smoking Status: quit > 6 months  Current Medications (verified): 1)  Lasix 20 Mg Tabs (Furosemide) .... Take 1 Tablet By Mouth Once A Day 2)  Norvasc 2.5 Mg Tabs (Amlodipine Besylate) .... Take 1 Tablet By Mouth Once A Day 3)  Klor-Con 10 10 Meq Tbcr (Potassium Chloride) .... One A Day 4)  Ocuvite   Tabs (Multiple Vitamins-Minerals) .[name].. 1 By Mouth Daily 5)  One Daily Multiple Vitamin   Tabs (Multiple Vitamin) .[name].. 1 By Mouth Daily 6)  Caltrate 600   Tabs (Calcium Carbonate Tabs) .[name].. 1 By Mouth Daily 7)  O2 @ 2-3 L/min Via Nasal Cannula 8)  Imitrex 100 Mg  Tabs (Sumatriptan Succinate) .... As Needed 9)  Epi Pen .... As Needed 10)  Allergy Vaccine 1:10 Go (W-E) 11)  Simvastatin 80 Mg  Tabs (Simvastatin) .... One Tab By Mouth At Night 12)  Levoxyl 112  Mcg Tabs (Levothyroxine Sodium) .... One Tab By Mouth Once Daily 13)  Cymbalta 60 Mg Cpep (Duloxetine Hcl) .... One Tab By Mouth Once Daily 14)  Celebrex 200 Mg Caps (Celecoxib) .[name].. 1 Daily By Mouth 15)  Ranitidine Hcl 300 Mg Tabs (Ranitidine Hcl) .[name].. 1 Tablet Twice A Day 16)  Alprazolam 1 Mg Tbdp (Alprazolam) .... 1/2 Tablet Twice A Day For Stress Only 17)  Lyrica 50 Mg Caps (Pregabalin) .... One Tab By Mouth Two Times A Day 18)  Vesicare 10 Mg Tabs (Solifenacin Succinate) .... Onre Tab By Mouth Qd 19)  Oxycodone Hcl 10 Mg Tabs (Oxycodone Hcl) .... One Tab By Mouth Three  Times A Day As Needed Back Pain. Take Sparingly. 20)  Ipratropium Bromide 0.06 % Soln (Ipratropium Bromide) .[name].. 1-2 Spray Each Nostril Three Times A Day As Needed Runny Nose  Allergies (verified): 1)  ! Sulfadiazine (Sulfadiazine) 2)  ! Oxycontin  Past History:  Past medical, surgical, family and social histories (including risk factors) reviewed, and no changes noted (except as noted below).  Past Medical History: Reviewed history from 05/11/2008 and no changes required. OSTEOPOROSIS (ICD-733.00) BRONCHITIS-ACUTE (ICD-466.0) ALLERGIC RHINITIS (ICD-477.9) SCREENING FOR MALIGNANNT NEOPLASM, SITE NEC (ICD-V76.49) PAIN, CHRONIC NEC (ICD-338.29) HYPERGLYCEMIA (ICD-790.6) INSOMNIA (ICD-780.52) ASTHMATIC BRONCHITIS, WITH COPD (ICD-466.0) POSTMENOPAUSAL ON HORMONE REPLACEMENT THERAPY (ICD-V07.4) DEGENERATIVE DISC DISEASE (  ICD-722.6) REACTIVE AIRWAY DISEASE (ICD-493.90) ALLERGY, ENVIRONMENTAL (ICD-995.3) COMMON MIGRAINE (ICD-346.10) FIBROMYALGIA (ICD-729.1) HYPERTENSION (ICD-401.9) HYPERLIPIDEMIA (ICD-272.4) GERD (ICD-530.81) DIVERTICULOSIS, COLON (ICD-562.10) HYPOTHYROIDISM NOS (ICD-244.9)  Past Surgical History: Reviewed history from 05/30/2009 and no changes required.                      NSVD X2  (Daughters) 1972             L Breast Cystectomy 1986             Back Surgery  DDD  Spinal Fusion 11/02/02          Colonoscopy  int. hemorrhoids, divertics    2014 05/09/04          Persantine Cardiolite - low risk study EF 76% 5/17-5/18/06 Chest pain VQ low prob, large fibroid 11/22/04          CT chest V/Q scan nml. 07/23/05          Persantine Cardiolite - inf. wall thinning (-) ischemia EF 77% 09/14/05            Right shoulder replacement ([facility]) 5/17-5/18/07 [facility] R/O'd 01/16/07         MRI  Multi lvl Arthropathy Enlarged Uterus 05/29/07       DEXA Osteoporotic Fem Neck L/S 10/08/07           Dobutamine Myoview nml  11/03/07         Arthroscopy L Knee Partial Medial Meniscectomy with MCL Sprain ( [place]) 03/10/08           Head CT Atrophy 03/11/08           Head MRI Remote Lacunar Infarcts, Subcortical white matter disease   No acute infarct 9/1-03/11/08     HOSP Atered Ment Status from dementia and polypharmacy   To [facility] 03/09/08           MRI/MRA No acute infarct, some atrophy mild-mod atherosclerotic change 03/10/08          CT Head no acute changes 03/10/08          Echocardiogram  EF 60% No Emboli 9/3-03/17/08    HOSP Gait D/O w/TIA (Dr Sharene Skeans)  Vertell Limber Lovenox for DVT Prophyl Early Dementia (Dr Jeanie Sewer) 10/2-10/3/09HOSP Altered Mental Status due to UTI Anxiety Depression Fibromyalgia  Family History: Reviewed history from 05/30/2009 and no changes required. Father: Died 50 MI, HTN Mother: Died 80 CHF, RAD, HTN Siblings: 1 brother died lung CA HTN, 1 brother died lobectomy, HTN, pneumonia 1 sister alive 77  lymphoma (non-Hodgkin's) breast CA, HTN 1 sister died 60 asthma (Staph pneumonia) HTN CV:  (+) father who died of MI, mother died CHF HBP:  (+) mother/father, all sibs CA:  lung CA brothers x 2, sister (+lymphoma) Stroke:  (-)  Social History: Reviewed history from 12/24/2006 and no changes required. Marital Status: Married, lives with husband (retired school principal) Children: 2 married, 4 grandsons Occupation: Housewife Former Smoker, 1-1/2 PPD x 20 years - 53 PYH, quit 15  or more years ago  ~87 Alcohol use-no Drug use-no  Review of Systems       The patient complains of shortness of breath with activity, non-productive cough, acid heartburn, and indigestion.  The patient denies shortness of breath at rest, productive cough, coughing up blood, chest pain, irregular heartbeats, loss of appetite, weight change, abdominal pain, difficulty swallowing, sore throat, tooth/dental problems, headaches, nasal congestion/difficulty breathing through nose, sneezing, itching, ear ache, anxiety, depression,  hand/feet swelling, joint stiffness or pain, rash, change in color of mucus, and fever.    Vital Signs:  Patient profile:   77 year old female Height:      63 inches Weight:      222.50 pounds BMI:     39.56 O2 Sat:      94 % on 3 L/minpulsed Temp:     97.8 degrees F oral Pulse rate:   72 / minute BP sitting:   126 / 62  (left arm) Cuff size:   large  Vitals Entered By: Gweneth Dimitri RN (August 31, 2009 10:06 AM)  O2 Flow:  3 L/minpulsed CC: Acute Visit.  wheezing most of the time but worse with activity, increased SOB with activity, and "extreme tireness" x 2-3 wks.  also c/o dry cough that has gotten worse over the past 2-3 wks and is worse when laying down.   Comments Medications reviewed with patient Daytime contact number verified with patient. Gweneth Dimitri RN  August 31, 2009 10:07 AM    Physical Exam  Additional Exam:  General: A/Ox3; pleasant and cooperative, NAD,supplemental oxygen , walker, pleasant lady SKIN: no rash, lesions NODES: no lymphadenopathy HEENT: [place]/AT, EOM- WNL, Conjuctivae- clear, PERRLA, TM-WNL, Nose- clear, not overdried, Throat- clear and wnl NECK: Supple w/ fair ROM, JVD- none, normal carotid impulses w/o bruits Thyroid- normal to palpation CHEST: insp / exp wheeze,  poor airflow HEART: RRR, no m/g/r heard ABDOMEN: Soft and nl;  ZOX:WRUE, nl pulses, no edema  NEURO: Grossly intact to observation      Impression &  Recommendations:  Problem # 1:  ASTHMATIC BRONCHITIS, WITH COPD (ICD-466.0) Assessment Deteriorated asthmatic bronchitis with flare, the pt has poor understanding of medication program plan Depomedrol 120mg  Injection will be given Prednisone 10mg  4 each am x3days, 3 x 3days, 2 x 3days, 1 x 3days then stop Stay on inhalers Return in one week with all medications in a bag (including inhalers ) for a medication reconciliation visit with Tammy Parrett Return in  one month The following medications were removed from the medication list:    Clarithromycin 500 Mg Tabs (Clarithromycin) ..... One tab by mouth two times a day Her updated medication list for this problem includes:    Serevent Diskus 50 Mcg/dose Aepb (Salmeterol xinafoate) ..... One puff two times a day    Flovent Hfa 220 Mcg/act Aero (Fluticasone propionate  hfa) .[name]..[name] Two  puffs twice daily    Spiriva Handihaler 18 Mcg Caps (Tiotropium bromide monohydrate) .[name]..[name] Two puffs in handihaler daily    Proair Hfa 108 (90 Base) Mcg/act Aers (Albuterol sulfate) .[name]... 1-2 puffs every 4-6 hours as needed  Orders: Depo- Medrol 80mg  (J1040) Depo- Medrol 40mg  (J1030) Admin of Therapeutic Inj  intramuscular or subcutaneous (45409)  Medications Added to Medication List This Visit: 1)  Serevent Diskus 50 Mcg/dose Aepb (Salmeterol xinafoate) .... One puff two times a day 2)  Flovent Hfa 220 Mcg/act Aero (Fluticasone propionate  hfa) .... Two  puffs twice daily 3)  Nasonex 50 Mcg/act Susp (Mometasone furoate) .... Two puffs each nostril daily 4)  Spiriva Handihaler 18 Mcg Caps (Tiotropium bromide monohydrate) .... Two puffs in handihaler daily 5)  Proair Hfa 108 (90 Base) Mcg/act Aers (Albuterol sulfate) .[name].. 1-2 puffs every 4-6 hours as needed 6)  Prednisone 10 Mg Tabs (Prednisone) .... Take as directed 4 each am x3days, 3 x 3days, 2 x 3days, 1 x 3days then stop  Complete Medication List: 1)  Lasix 20 Mg  Tabs (Furosemide) .... Take 1 tablet by  mouth once a day 2)  Norvasc 2.5 Mg Tabs (Amlodipine besylate) .... Take 1 tablet by mouth once a day 3)  Klor-con 10 10 Meq Tbcr (Potassium chloride) .[name].. 11/2  a day 4)  Ocuvite Tabs (Multiple vitamins-minerals) .[name].. 1 by mouth daily 5)  One Daily Multiple Vitamin Tabs (Multiple vitamin) .[name].. 1 by mouth daily 6)  Caltrate 600 Tabs (Calcium carbonate tabs) .[name].. 1 by mouth daily 7)  O2 @ 2-3 L/min Via Nasal Cannula  8)  Imitrex 100 Mg Tabs (Sumatriptan succinate) .... As needed 9)  Epi Pen  .... As needed 10)  Allergy Vaccine 1:10 Go (w-e)  11)  Simvastatin 80 Mg Tabs (Simvastatin) .... One tab by mouth at night 12)  Levoxyl 112 Mcg Tabs (Levothyroxine sodium) .... One tab by mouth once daily 13)  Cymbalta 60 Mg Cpep (Duloxetine hcl) .... One tab by mouth once daily 14)  Celebrex 200 Mg Caps (Celecoxib) .[name].. 1 daily by mouth 15)  Ranitidine Hcl 300 Mg Tabs (Ranitidine hcl) .[name].. 1 tablet twice a day 16)  Alprazolam 1 Mg Tbdp (Alprazolam) .... 1/2 tablet twice a day for stress only 17)  Lyrica 50 Mg Caps (Pregabalin) .... One tab by mouth two times a day 18)  Vesicare 10 Mg Tabs (Solifenacin succinate) .... Onre tab by mouth qd 19)  Oxycodone Hcl 10 Mg Tabs (Oxycodone hcl) .... One tab by mouth three  times a day as needed back pain. take sparingly. 20)  Ipratropium Bromide 0.06 % Soln (Ipratropium bromide) .[name].. 1-2 spray each nostril three times a day as needed runny nose 21)  Serevent Diskus 50 Mcg/dose Aepb (Salmeterol xinafoate) .... One puff two times a day 22)  Flovent Hfa 220 Mcg/act Aero (Fluticasone propionate  hfa) .... Two  puffs twice daily 23)  Nasonex 50 Mcg/act Susp (Mometasone furoate) .... Two puffs each nostril daily 24)  Spiriva Handihaler 18 Mcg Caps (Tiotropium bromide monohydrate) .... Two puffs in handihaler daily 25)  Proair Hfa 108 (90 Base) Mcg/act Aers (Albuterol sulfate) .[name].. 1-2 puffs every 4-6 hours as needed 26)  Prednisone 10 Mg Tabs (Prednisone) .... Take as  directed 4 each am x3days, 3 x 3days, 2 x 3days, 1 x 3days then stop  Other Orders: Est. Patient Level IV (29562)  Patient Instructions: 1)  Depomedrol 120mg  Injection will be given 2)  Prednisone 10mg  4 each am x3days, 3 x 3days, 2 x 3days, 1 x 3days then stop 3)  Stay on inhalers 4)  Return in one week with all medications in a bag (including inhalers ) for a medication reconciliation visit with Tammy Parrett 5)  Return Dr Delford Field one month Prescriptions: PREDNISONE 10 MG  TABS (PREDNISONE) Take as directed 4 each am x3days, 3 x 3days, 2 x 3days, 1 x 3days then stop  #30 x 0   Entered and Authorized by:   Storm Frisk MD   Signed by:   Storm Frisk MD on 08/31/2009   Method used:   [facility]        [facility] [phone][facility] (retail)       [address]       [place], [place]  [address]       Ph: [phone] or [phone]       Fax: [phone]   RxID:   [id number]      Medication Administration  Injection #  1:    Medication: Depo- Medrol 80mg     Diagnosis: ASTHMATIC BRONCHITIS, WITH COPD (ICD-466.0)    Route: IM    Site: LUOQ gluteus    Exp Date: 10/11    Lot #: [id number]    Mfr: Teva    Patient tolerated injection without complications    Given by: Zackery Barefoot CMA (August 31, 2009 11:03 AM)  Injection # 2:    Medication: Depo- Medrol 40mg     Diagnosis: ASTHMATIC BRONCHITIS, WITH COPD (ICD-466.0)    Route: IM    Site: LUOQ gluteus    Exp Date: 10/11    Lot #: [id number]    Mfr: Teva    Patient tolerated injection without complications    Given by: Zackery Barefoot CMA (August 31, 2009 11:03 AM)  Orders Added: 1)  Est. Patient Level IV [56213] 2)  Depo- Medrol 80mg  [J1040] 3)  Depo- Medrol 40mg  [J1030] 4)  Admin of Therapeutic Inj  intramuscular or subcutaneous [08657]

## 2010-08-10 NOTE — Miscellaneous (Signed)
Summary: ONO RA and spirometry    Clinical Lists Changes  Observations: Added new observation of PFT COMMENTS: severe peripheral airflow obstruction (06/15/2010 13:28) Added new observation of FEF % EXPEC: 37 % (06/15/2010 13:28) Added new observation of FEF 25-75%: 0.61 L/min (06/15/2010 13:28) Added new observation of FEV1/FVC%EXP: 84 % (06/15/2010 13:28) Added new observation of FEV1/FVC: 62 % (06/15/2010 13:28) Added new observation of FEV1 % EXP: 60 % (06/15/2010 13:28) Added new observation of FEV1: 1.10 L (06/15/2010 13:28) Added new observation of FVC % EXPECT: 69 % (06/15/2010 13:28) Added new observation of FVC: 1.68 L (06/15/2010 13:28) Added new observation of PFT DATE: 05/30/2010  (06/15/2010 13:28) Added new observation of SLEEP STUDY: Low Oxygen Sat: 83 % Oximetry overnight on       RA            =   18% time <90%, 12% < 89%   (05/30/2010 13:30)      Sleep Study  Procedure date:  05/30/2010  Findings:      Low Oxygen Sat: 83 % Oximetry overnight on       RA            =   18% time <90%, 12% < 89%     Pulmonary Function Test Date: 05/30/2010 Height (in.): 61 Gender: Female  Pre-Spirometry FVC    Value: 1.68 L/min   % Pred: 69 % FEV1    Value: 1.10 L     % Pred: 60 % FEV1/FVC  Value: 62 %     % Pred: 84 % FEF 25-75  Value: 0.61 L/min   % Pred: 37 %  Comments: severe peripheral airflow obstruction

## 2010-08-10 NOTE — Assessment & Plan Note (Signed)
Summary: NP follow up - med calendar   Copy to:  Kelly Splinter Primary Provider/Referring Provider:  Dr. Hetty Ely  CC:  new med calendar - pt brought all meds with her today.  no complaints..  History of Present Illness: This is a 77 year old, white female with chronic obstructive lung disease and chronic bronchitic component.    August 31, 2009 10:26 AM Last ov 8/10: Seen Dr young regarding allergy eval. Now notes wheezing for three weeks. Notes more cough at night,  no mucous,  notes some pndrip,  no f/c/s. The pt notes more heartburn esp at night .   September 07, 2009--Presents for follow up and med review. Last visit w/ COPD flare tx w/ steroid taper . She is feeling much better. We reviewed all her meds today and completed a med calendar. Her wheezing and cough are improved, energy level is returning. Denies chest pain, dyspnea, orthopnea, hemoptysis, fever, n/v/d, edema, headache.   Anticoagulation Management History:      Positive risk factors for bleeding include an age of 47 years or older.  The bleeding index is 'intermediate risk'.  Positive CHADS2 values include History of HTN and Age > 37 years old.    Medications Prior to Update: 1)  Levoxyl 112 Mcg Tabs (Levothyroxine Sodium) .... One Tab By Mouth Once Daily 2)  One Daily Multiple Vitamin   Tabs (Multiple Vitamin) .Marland Kitchen.. 1 By Mouth Daily 3)  Lasix 20 Mg Tabs (Furosemide) .... Take 1 Tablet By Mouth Once A Day 4)  Klor-Con 10 10 Meq Tbcr (Potassium Chloride) .Marland Kitchen.. 11/2  A Day 5)  Caltrate 600   Tabs (Calcium Carbonate Tabs) .Marland Kitchen.. 1 By Mouth Daily 6)  O2 @ 2-3 L/min Via Nasal Cannula 7)  Spiriva Handihaler 18 Mcg  Caps (Tiotropium Bromide Monohydrate) .... Two Puffs in Handihaler Daily 8)  Serevent Diskus 50 Mcg/dose Aepb (Salmeterol Xinafoate) .... One Puff Two Times A Day 9)  Flovent Hfa 220 Mcg/act  Aero (Fluticasone Propionate  Hfa) .... Two  Puffs Twice Daily 10)  Nasonex 50 Mcg/act  Susp (Mometasone  Furoate) .... Two Puffs Each Nostril Daily 11)  Ranitidine Hcl 300 Mg Tabs (Ranitidine Hcl) .Marland Kitchen.. 1 Tablet Twice A Day 12)  Lyrica 50 Mg Caps (Pregabalin) .... One Tab By Mouth Two Times A Day 13)  Cymbalta 60 Mg Cpep (Duloxetine Hcl) .... One Tab By Mouth Once Daily 14)  Vesicare 10 Mg Tabs (Solifenacin Succinate) .... Onre Tab By Mouth Qd 15)  Simvastatin 80 Mg  Tabs (Simvastatin) .... One Tab By Mouth At Night 16)  Norvasc 2.5 Mg Tabs (Amlodipine Besylate) .... Take 1 Tablet By Mouth Once A Day 17)  Alprazolam 1 Mg Tbdp (Alprazolam) .... 1/2 Tablet Twice A Day For Stress Only 18)  Imitrex 100 Mg  Tabs (Sumatriptan Succinate) .... As Needed 19)  Proair Hfa 108 (90 Base) Mcg/act  Aers (Albuterol Sulfate) .Marland Kitchen.. 1-2 Puffs Every 4-6 Hours As Needed 20)  Ocuvite   Tabs (Multiple Vitamins-Minerals) .Marland Kitchen.. 1 By Mouth Daily 21)  Epi Pen .... As Needed 22)  Allergy Vaccine 1:10 Go (W-E) 23)  Celebrex 200 Mg Caps (Celecoxib) .Marland Kitchen.. 1 Daily By Mouth 24)  Oxycodone Hcl 10 Mg Tabs (Oxycodone Hcl) .... One Tab By Mouth Three  Times A Day As Needed Back Pain. Take Sparingly. 25)  Ipratropium Bromide 0.06 % Soln (Ipratropium Bromide) .Marland Kitchen.. 1-2 Spray Each Nostril Three Times A Day As Needed Runny Nose 26)  Prednisone 10 Mg  Tabs (  Prednisone) .... Take As Directed 4 Each Am X3days, 3 X 3days, 2 X 3days, 1 X 3days Then Stop  Current Medications (verified): 1)  Levoxyl 112 Mcg Tabs (Levothyroxine Sodium) .... One Tab By Mouth Once Daily 2)  Vitamin D3 1000 Unit Caps (Cholecalciferol) .... Take 1 Capsule By Mouth Once A Day 3)  Vitamin B-12 1000 Mcg Tabs (Cyanocobalamin) .... Take 1 Tablet By Mouth Once A Day 4)  One Daily Multiple Vitamin   Tabs (Multiple Vitamin) .Marland Kitchen.. 1 By Mouth Daily 5)  Lasix 20 Mg Tabs (Furosemide) .... Take 1 Tablet By Mouth Once A Day 6)  Klor-Con 10 10 Meq Tbcr (Potassium Chloride) .... Take 1 Tablet By Mouth Once A Day 7)  Caltrate 600   Tabs (Calcium Carbonate Tabs) .Marland Kitchen.. 1 By Mouth  Daily 8)  Oxygen .... 2 L/min Continuously 9)  Spiriva Handihaler 18 Mcg  Caps (Tiotropium Bromide Monohydrate) .... Inhale Contents of 1 Capsule Once A Day 10)  Serevent Diskus 50 Mcg/dose Aepb (Salmeterol Xinafoate) .... Inhale 1 Puff Two Times A Day 11)  Flovent Hfa 220 Mcg/act  Aero (Fluticasone Propionate  Hfa) .... Inhale 2 Puffs Two Times A Day 12)  Fluticasone Propionate 50 Mcg/act Susp (Fluticasone Propionate) .... 2 Puffs Each Nostril At Bedtime 13)  Ranitidine Hcl 300 Mg Tabs (Ranitidine Hcl) .Marland Kitchen.. 1 Tablet Twice A Day 14)  Lyrica 50 Mg Caps (Pregabalin) .... One Tab By Mouth Two Times A Day 15)  Cymbalta 60 Mg Cpep (Duloxetine Hcl) .... One Tab By Mouth Once Daily 16)  Vesicare 10 Mg Tabs (Solifenacin Succinate) .... Take 1 Tab By Mouth At Bedtime 17)  Ortho-Est 1.25 1.5 Mg Tabs (Estropipate) .... Take 1 Tab By Mouth At Bedtime 18)  Medroxyprogesterone Acetate 2.5 Mg Tabs (Medroxyprogesterone Acetate) .... Take 1 Tab By Mouth At Bedtime 19)  Simvastatin 80 Mg  Tabs (Simvastatin) .... One Tab By Mouth At Night 20)  Norvasc 2.5 Mg Tabs (Amlodipine Besylate) .... Take 1 Tablet By Mouth Once A Day 21)  Metoprolol Succinate 25 Mg Xr24h-Tab (Metoprolol Succinate) .... Take 1 Tablet By Mouth Once A Day As Needed Palpitations 22)  Levsin 0.125 Mg Tabs (Hyoscyamine Sulfate) .... Take 1 Tablet By Mouth Three Times A Day As Needed Abdominal Cramping 23)  Alprazolam 1 Mg Tbdp (Alprazolam) .... 1/2 Tablet Twice A Day As Needed For Stress Only 24)  Ambien 10 Mg Tabs (Zolpidem Tartrate) .... One Tab By Mouth At Night As Needed Insomnia 25)  Oxycodone-Acetaminophen 5-325 Mg Tabs (Oxycodone-Acetaminophen) .Marland Kitchen.. 1 Every 6 Hours As Needed Pain 26)  Oxygen .... Increase To 3 L/min As Needed Shortness of Breath W/ Activity 27)  Imitrex 50 Mg Tabs (Sumatriptan Succinate) .Marland Kitchen.. 1 At Onset of Headache 28)  Proair Hfa 108 (90 Base) Mcg/act  Aers (Albuterol Sulfate) .... 2 Puffs Every 4 Hours As Needed 29)  Epi  Pen .... Use As Needed Severe Allergic Reaction 30)  Allergy Vaccine 1:10 Go (W-E) .Marland Kitchen.. 1 Injection Once Weekly  Allergies (verified): 1)  ! Sulfadiazine (Sulfadiazine) 2)  ! Oxycontin  Past History:  Past Medical History: Last updated: 05/11/2008 OSTEOPOROSIS (ICD-733.00) BRONCHITIS-ACUTE (ICD-466.0) ALLERGIC RHINITIS (ICD-477.9) SCREENING FOR MALIGNANNT NEOPLASM, SITE NEC (ICD-V76.49) PAIN, CHRONIC NEC (ICD-338.29) HYPERGLYCEMIA (ICD-790.6) INSOMNIA (ICD-780.52) ASTHMATIC BRONCHITIS, WITH COPD (ICD-466.0) POSTMENOPAUSAL ON HORMONE REPLACEMENT THERAPY (ICD-V07.4) DEGENERATIVE DISC DISEASE (ICD-722.6) REACTIVE AIRWAY DISEASE (ICD-493.90) ALLERGY, ENVIRONMENTAL (ICD-995.3) COMMON MIGRAINE (ICD-346.10) FIBROMYALGIA (ICD-729.1) HYPERTENSION (ICD-401.9) HYPERLIPIDEMIA (ICD-272.4) GERD (ICD-530.81) DIVERTICULOSIS, COLON (ICD-562.10) HYPOTHYROIDISM NOS (ICD-244.9)  Past Surgical History:  Last updated: 2009-06-26                      NSVD X2  ([facility]) 1972             L Breast Cystectomy 1986             Back Surgery  DDD  Spinal Fusion 11/02/02         Colonoscopy  int. hemorrhoids, divertics    2014 05/09/04          Persantine Cardiolite - low risk study EF 76% 5/17-5/18/06 Chest pain VQ low prob, large fibroid 11/22/04          CT chest V/Q scan nml. 07/23/05          Persantine Cardiolite - inf. wall thinning (-) ischemia EF 77% 09/14/05            Right shoulder replacement ([facility]) 5/17-5/18/07 [facility] R/O'd 01/16/07         MRI  Multi lvl Arthropathy Enlarged Uterus 05/29/07       DEXA Osteoporotic Fem Neck L/S 10/08/07           Dobutamine Myoview nml  11/03/07         Arthroscopy L Knee Partial Medial Meniscectomy with MCL Sprain ( [place]) 03/10/08           Head CT Atrophy 03/11/08           Head MRI Remote Lacunar Infarcts, Subcortical white matter disease   No acute infarct 9/1-03/11/08     HOSP Atered Ment Status from dementia and polypharmacy   [facility] 03/09/08            MRI/MRA No acute infarct, some atrophy mild-mod atherosclerotic change 03/10/08          CT Head no acute changes 03/10/08          Echocardiogram  EF 60% No Emboli 9/3-03/17/08    HOSP Gait D/O w/TIA (Dr Sharene Skeans)  Vertell Limber Lovenox for DVT Prophyl Early Dementia (Dr Jeanie Sewer) 10/2-10/3/09HOSP Altered Mental Status due to UTI Anxiety Depression Fibromyalgia  Family History: Last updated: 06-26-09 Father: Died 40 MI, HTN Mother: Died 29 CHF, RAD, HTN Siblings: 1 brother died lung CA HTN, 1 brother died lobectomy, HTN, pneumonia 1 sister alive 72  lymphoma (non-Hodgkin's) breast CA, HTN 1 sister died 42 asthma (Staph pneumonia) HTN CV:  (+) father who died of MI, mother died CHF HBP:  (+) mother/father, all sibs CA:  lung CA brothers x 2, sister (+lymphoma) Stroke:  (-)  Social History: Last updated: 12/24/2006 Marital Status: Married, lives with husband (retired school principal) Children: 2 married, 4 grandsons Occupation: Housewife Former Smoker, 1-1/2 PPD x 20 years - 25 PYH, quit 15 or more years ago  ~87 Alcohol use-no Drug use-no  Risk Factors: Alcohol Use: 0 (June 26, 2009) Caffeine Use: 2 (2009-06-26) Exercise: no (06-26-2009)  Risk Factors: Smoking Status: quit > 6 months (08/31/2009) Passive Smoke Exposure: no (06/26/09)  Review of Systems      See HPI  Vital Signs:  Patient profile:   77 year old female Height:      63 inches Weight:      218.38 pounds BMI:     38.82 O2 Sat:      95 % on 3 L/min pulsing Temp:     97.6 degrees F oral Pulse rate:   72 / minute BP sitting:   150 / 86  (left arm)  Cuff size:   large  Vitals Entered By: Boone Master CNA (September 07, 2009 10:16 AM)  O2 Flow:  3 L/min pulsing CC: new med calendar - pt brought all meds with her today.  no complaints. Is Patient Diabetic? No Comments Medications reviewed with patient Daytime contact number verified with patient. Boone Master CNA  September 07, 2009 10:17 AM    Physical  Exam  Additional Exam:  General: A/Ox3; pleasant and cooperative, NAD,supplemental oxygen , pleasant lady SKIN: no rash, lesions NODES: no lymphadenopathy HEENT: [place]/AT, EOM- WNL, Conjuctivae- clear, PERRLA, TM-WNL, Nose- clear, not overdried, Throat- clear and wnl NECK: Supple w/ fair ROM, JVD- none, normal carotid impulses w/o bruits Thyroid- normal to palpation CHEST: coarse BS w/ no wheezing.  HEART: RRR, no m/g/r heard ABDOMEN: Soft and nl;  AVW:UJWJ, nl pulses, no edema  NEURO: Grossly intact to observation      Impression & Recommendations:  Problem # 1:  ASTHMATIC BRONCHITIS, WITH COPD (ICD-466.0)  Recent flare now resolved.  Meds reviewed with pt education and computerized med calendar completed/adjusted.   REC:  Hold Fish Oil for now  Continue on same meds.  Folllow med calendar closely and bring to each visit.  Please contact office for sooner follow up if symptoms do not improve or worsen  follow up Dr. Delford Field in 3 weeks as scheduled  Her updated medication list for this problem includes:    Spiriva Handihaler 18 Mcg Caps (Tiotropium bromide monohydrate) ..... Inhale contents of 1 capsule once a day    Serevent Diskus 50 Mcg/dose Aepb (Salmeterol xinafoate) ..... Inhale 1 puff two times a day    Flovent Hfa 220 Mcg/act Aero (Fluticasone propionate  hfa) ..... Inhale 2 puffs two times a day    Proair Hfa 108 (90 Base) Mcg/act Aers (Albuterol sulfate) .Marland Kitchen... 2 puffs every 4 hours as needed  Orders: Est. Patient Level III (19147)  Medications Added to Medication List This Visit: 1)  Vitamin D3 1000 Unit Caps (Cholecalciferol) .... Take 1 capsule by mouth once a day 2)  Vitamin B-12 1000 Mcg Tabs (Cyanocobalamin) .... Take 1 tablet by mouth once a day 3)  Klor-con 10 10 Meq Tbcr (Potassium chloride) .... Take 1 tablet by mouth once a day 4)  Oxygen  .... 2 l/min continuously 5)  Spiriva Handihaler 18 Mcg Caps (Tiotropium bromide monohydrate) .... Inhale contents of 1  capsule once a day 6)  Serevent Diskus 50 Mcg/dose Aepb (Salmeterol xinafoate) .... Inhale 1 puff two times a day 7)  Flovent Hfa 220 Mcg/act Aero (Fluticasone propionate  hfa) .... Inhale 2 puffs two times a day 8)  Fluticasone Propionate 50 Mcg/act Susp (Fluticasone propionate) .... 2 puffs each nostril at bedtime 9)  Vesicare 10 Mg Tabs (Solifenacin succinate) .... Take 1 tab by mouth at bedtime 10)  Ortho-est 1.25 1.5 Mg Tabs (Estropipate) .... Take 1 tab by mouth at bedtime 11)  Medroxyprogesterone Acetate 2.5 Mg Tabs (Medroxyprogesterone acetate) .... Take 1 tab by mouth at bedtime 12)  Metoprolol Succinate 25 Mg Xr24h-tab (Metoprolol succinate) .... Take 1 tablet by mouth once a day as needed palpitations 13)  Levsin 0.125 Mg Tabs (Hyoscyamine sulfate) .... Take 1 tablet by mouth three times a day as needed abdominal cramping 14)  Alprazolam 1 Mg Tbdp (Alprazolam) .... 1/2 tablet twice a day as needed for stress only 15)  Oxycodone-acetaminophen 5-325 Mg Tabs (Oxycodone-acetaminophen) .Marland Kitchen.. 1 every 6 hours as needed pain 16)  Oxygen  .... Increase  to 3 l/min as needed shortness of breath w/ activity 17)  Imitrex 50 Mg Tabs (Sumatriptan succinate) .Marland Kitchen.. 1 at onset of headache 18)  Proair Hfa 108 (90 Base) Mcg/act Aers (Albuterol sulfate) .... 2 puffs every 4 hours as needed 19)  Epi Pen  .... Use as needed severe allergic reaction 20)  Allergy Vaccine 1:10 Go (w-e)  .Marland Kitchen.. 1 injection once weekly  Complete Medication List: 1)  Levoxyl 112 Mcg Tabs (Levothyroxine sodium) .... One tab by mouth once daily 2)  Vitamin D3 1000 Unit Caps (Cholecalciferol) .... Take 1 capsule by mouth once a day 3)  Vitamin B-12 1000 Mcg Tabs (Cyanocobalamin) .... Take 1 tablet by mouth once a day 4)  One Daily Multiple Vitamin Tabs (Multiple vitamin) .Marland Kitchen.. 1 by mouth daily 5)  Lasix 20 Mg Tabs (Furosemide) .... Take 1 tablet by mouth once a day 6)  Klor-con 10 10 Meq Tbcr (Potassium chloride) .... Take 1 tablet by  mouth once a day 7)  Caltrate 600 Tabs (Calcium carbonate tabs) .Marland Kitchen.. 1 by mouth daily 8)  Oxygen  .... 2 l/min continuously 9)  Spiriva Handihaler 18 Mcg Caps (Tiotropium bromide monohydrate) .... Inhale contents of 1 capsule once a day 10)  Serevent Diskus 50 Mcg/dose Aepb (Salmeterol xinafoate) .... Inhale 1 puff two times a day 11)  Flovent Hfa 220 Mcg/act Aero (Fluticasone propionate  hfa) .... Inhale 2 puffs two times a day 12)  Fluticasone Propionate 50 Mcg/act Susp (Fluticasone propionate) .... 2 puffs each nostril at bedtime 13)  Ranitidine Hcl 300 Mg Tabs (Ranitidine hcl) .Marland Kitchen.. 1 tablet twice a day 14)  Lyrica 50 Mg Caps (Pregabalin) .... One tab by mouth two times a day 15)  Cymbalta 60 Mg Cpep (Duloxetine hcl) .... One tab by mouth once daily 16)  Vesicare 10 Mg Tabs (Solifenacin succinate) .... Take 1 tab by mouth at bedtime 17)  Ortho-est 1.25 1.5 Mg Tabs (Estropipate) .... Take 1 tab by mouth at bedtime 18)  Medroxyprogesterone Acetate 2.5 Mg Tabs (Medroxyprogesterone acetate) .... Take 1 tab by mouth at bedtime 19)  Simvastatin 80 Mg Tabs (Simvastatin) .... One tab by mouth at night 20)  Norvasc 2.5 Mg Tabs (Amlodipine besylate) .... Take 1 tablet by mouth once a day 21)  Metoprolol Succinate 25 Mg Xr24h-tab (Metoprolol succinate) .... Take 1 tablet by mouth once a day as needed palpitations 22)  Levsin 0.125 Mg Tabs (Hyoscyamine sulfate) .... Take 1 tablet by mouth three times a day as needed abdominal cramping 23)  Alprazolam 1 Mg Tbdp (Alprazolam) .... 1/2 tablet twice a day as needed for stress only 24)  Ambien 10 Mg Tabs (Zolpidem tartrate) .... One tab by mouth at night as needed insomnia 25)  Oxycodone-acetaminophen 5-325 Mg Tabs (Oxycodone-acetaminophen) .Marland Kitchen.. 1 every 6 hours as needed pain 26)  Oxygen  .... Increase to 3 l/min as needed shortness of breath w/ activity 27)  Imitrex 50 Mg Tabs (Sumatriptan succinate) .Marland Kitchen.. 1 at onset of headache 28)  Proair Hfa 108 (90 Base)  Mcg/act Aers (Albuterol sulfate) .... 2 puffs every 4 hours as needed 29)  Epi Pen  .... Use as needed severe allergic reaction 30)  Allergy Vaccine 1:10 Go (w-e)  .Marland Kitchen.. 1 injection once weekly   Patient Instructions: 1)  Hold Fish Oil for now  2)  Continue on same meds.  3)  Folllow med calendar closely and bring to each visit.  4)  Please contact office for sooner follow up if symptoms do  not improve or worsen  5)  follow up Dr. Delford Field in 3 weeks as scheduled

## 2010-08-10 NOTE — Letter (Signed)
Summary: [facility]  [facility]   Imported By: Lester Alton 06/05/2010 09:52:51  _____________________________________________________________________  External Attachment:    Type:   Image     Comment:   External Document

## 2010-08-10 NOTE — Progress Notes (Signed)
----   Converted from flag ---- ---- 02/06/2010 7:56 PM, Crawford Givens MD wrote: cmet 401.1 lipid 401.1 tsh 244.9 vit d 733.0  ---- 02/06/2010 12:36 PM, Liane Comber CMA (AAMA) wrote: Pt is scheduled for labs tomorrow, what labs to draw and dx codes? Thanks Tasha ------------------------------

## 2010-08-10 NOTE — Progress Notes (Signed)
Summary: prior Berkley Harvey is needed for [employer] Note From Pharmacy   [facility]  [address] [id number] Summary of Call: Prior Berkley Harvey is needed for [place], to get 30 a month.   She had auth but it has expired,  form is on your desk. Initial call taken by: Lowella Petties CMA,  March 02, 2010 2:25 PM  Follow-up for Phone Call        signed, in my out box.  thanks.  Follow-up by: Crawford Givens MD,  March 02, 2010 2:58 PM  Additional Follow-up for Phone Call Additional follow up Details #1::        From faxed.               Lowella Petties CMA  March 02, 2010 3:19 PM      Appended Document: prior Berkley Harvey is needed for [place] Prior auth received for [employer].  Approval letter placed on doctors desk for signature and scanning.

## 2010-08-10 NOTE — Progress Notes (Signed)
   Phone Note Refill Request Message from:  Fax from Pharmacy on August 13, 2007 5:19 PM  Refills Requested: Medication #1:  AMBIEN 10 MG  TABS 1 by mouth at bedtime as needed   Last Refilled: 07/08/2007 #34 WITH 2 REFILLS/ [phone]//SIGNED BY DR Hetty Ely THEN FAXED   Method Requested: Fax to Mail Away Pharmacy Initial call taken by: Providence Crosby,  August 13, 2007 5:20 PM

## 2010-08-11 ENCOUNTER — Other Ambulatory Visit: Payer: Self-pay | Admitting: Family Medicine

## 2010-08-11 ENCOUNTER — Other Ambulatory Visit (INDEPENDENT_AMBULATORY_CARE_PROVIDER_SITE_OTHER): Payer: Medicare Other

## 2010-08-11 ENCOUNTER — Encounter (INDEPENDENT_AMBULATORY_CARE_PROVIDER_SITE_OTHER): Payer: Self-pay | Admitting: *Deleted

## 2010-08-11 ENCOUNTER — Encounter: Payer: Self-pay | Admitting: Family Medicine

## 2010-08-11 DIAGNOSIS — R7989 Other specified abnormal findings of blood chemistry: Secondary | ICD-10-CM

## 2010-08-11 DIAGNOSIS — I10 Essential (primary) hypertension: Secondary | ICD-10-CM

## 2010-08-11 DIAGNOSIS — M810 Age-related osteoporosis without current pathological fracture: Secondary | ICD-10-CM

## 2010-08-11 LAB — HEMOGLOBIN A1C: Hgb A1c MFr Bld: 6.2 % (ref 4.6–6.5)

## 2010-08-11 LAB — BASIC METABOLIC PANEL
BUN: 17 mg/dL (ref 6–23)
CO2: 32 mEq/L (ref 19–32)
Calcium: 9.5 mg/dL (ref 8.4–10.5)
Chloride: 103 mEq/L (ref 96–112)
Creatinine, Ser: 0.8 mg/dL (ref 0.4–1.2)
GFR: 77.16 mL/min (ref >60.00)
Glucose, Bld: 83 mg/dL (ref 70–99)
Potassium: 4.6 mEq/L (ref 3.5–5.1)
Sodium: 142 mEq/L (ref 135–145)

## 2010-08-12 LAB — CONVERTED CEMR LAB: Vit D, 25-Hydroxy: 34 ng/mL (ref 30–89)

## 2010-08-14 ENCOUNTER — Ambulatory Visit: Payer: Self-pay | Admitting: Family Medicine

## 2010-08-16 NOTE — Progress Notes (Signed)
Summary: ? lab work  Phone Note Call from Patient Call back at [employer] [phone]   Caller: Patient Call For: Crawford Givens MD Summary of Call: Patient is requesting  blood work.  She says she is supposed to get her thyroid, liver and everything tested routinely.  She has no upcoming appointments scheduled.   Initial call taken by: Delilah Shan CMA Duncan Dull),  August 08, 2010 2:21 PM  Follow-up for Phone Call        She isn't due for cholesterol, liver, TSH labs.  She needs bmet 401.1, vit D 733.00, and A1c 790.6.  doesn't need to be fasting.  get OV a few days after the labs are done.  thanks.  Follow-up by: Crawford Givens MD,  August 08, 2010 9:09 PM  Additional Follow-up for Phone Call Additional follow up Details #1::        Left message with husband to return call. Lugene Fuquay CMA Duncan Dull)  August 09, 2010 5:07 PM   Lab appointment scheduled  08/11/2010 at 9:00 a.m. Appointment scheduled  with Dr. Para March 08/14/2010 at 11:00 Lugene Fuquay CMA (AAMA)  August 10, 2010 3:05 PM

## 2010-08-16 NOTE — Progress Notes (Addendum)
Summary: refill request for alprazolam  Phone Note Refill Request Call back at Home Phone [phone] Message from:  Patient  Refills Requested: Medication #1:  ALPRAZOLAM 1 MG TBDP 1/2 tablet twice a day as needed for stress only Pt is asking if the dose on this can be increased to one twice a day, instead of one half twice a day.  She is asking if a 3 month supply can be sent to [facility].    Initial call taken by: Lowella Petties CMA, AAMA,  August 08, 2010 1:01 PM  Follow-up for Phone Call        Please send this in as below, but [facility]  thanks.  Please advise patient that she can increase to a whole tab a day, but with sedation caution.   Follow-up by: Crawford Givens MD,  August 08, 2010 1:55 PM  Additional Follow-up for Phone Call Additional follow up Details #1::        Patient Advised.   Faxed to p harmacy. Additional Follow-up by: Delilah Shan CMA (AAMA),  August 08, 2010 2:15 PM    New/Updated Medications: ALPRAZOLAM 1 MG TBDP (ALPRAZOLAM) 1/2 to 1 tablet twice a day as needed for stress only Prescriptions: ALPRAZOLAM 1 MG TBDP (ALPRAZOLAM) 1/2 to 1 tablet twice a day as needed for stress only  #180 x 1   Entered by:   Delilah Shan CMA (AAMA)   Authorized by:   Crawford Givens MD   Signed by:   Delilah Shan CMA (AAMA) on 08/08/2010   Method used:   Printed then faxed to ...       [facility] (mail-order)             , [place]         Ph: [phone]       Fax: [phone]   RxID:   [id number] ALPRAZOLAM 1 MG TBDP (ALPRAZOLAM) 1/2 to 1 tablet twice a day as needed for stress only  #180 x 1   Entered and Authorized by:   Crawford Givens MD   Signed by:   Crawford Givens MD on 08/08/2010   Method used:   Telephoned to ...       [facility]  [address] [phone][facility] (retail)       [address]       [place], [place]  [address]       Ph: [phone] or [phone]       Fax: [phone]   RxID:   [id number]   Appended  Document: refill request for alprazolam Script changed to plain tablets, not the TBDP, which the pharmacist says is a desentigrating tablet and should not be cut in half.  Appended Document: refill request for alprazolam noted,  appreciate the help.   Appended Document: refill request for alprazolam Medications Added ALPRAZOLAM 1 MG TABS (ALPRAZOLAM) 1/2 to 1 tablet twice a day as needed for stress only       I updated the med list.   Clinical Lists Changes  Medications: Changed medication from ALPRAZOLAM 1 MG TBDP (ALPRAZOLAM) 1/2 to 1 tablet twice a day as needed for stress only to ALPRAZOLAM 1 MG TABS (ALPRAZOLAM) 1/2 to 1 tablet twice a day as needed for stress only Observations: Added new observation of DM PROGRESS: N/A (08/15/2010 11:52) Added new observation of DM FSREVIEW: N/A (08/15/2010 11:52)          Allergies: 1)  !  Sulfadiazine (Sulfadiazine) 2)  ! Oxycontin

## 2010-08-16 NOTE — Progress Notes (Signed)
Summary: lyrica  Phone Note Refill Request Message from:  Fax from Pharmacy on August 10, 2010 9:16 AM  Refills Requested: Medication #1:  LYRICA 50 MG CAPS one tab by mouth two times a day   Last Refilled: 05/22/2010 Refill request from [facility] [address] [phone].   Initial call taken by: Melody Comas,  August 10, 2010 9:17 AM  Follow-up for Phone Call        please call in.  Follow-up by: Crawford Givens MD,  August 10, 2010 10:10 AM  Additional Follow-up for Phone Call Additional follow up Details #1::        Medication phoned to pharmacy.  Additional Follow-up by: Delilah Shan CMA (AAMA),  August 10, 2010 12:03 PM    Prescriptions: LYRICA 50 MG CAPS (PREGABALIN) one tab by mouth two times a day  #180 x 1   Entered and Authorized by:   Crawford Givens MD   Signed by:   Crawford Givens MD on 08/10/2010   Method used:   Telephoned to ...       [facility]  [address] [phone]* (retail)       [address]       [place], [place]  [address]       Ph: [phone] or [phone]       Fax: [phone]   RxID:   [id number]

## 2010-08-17 ENCOUNTER — Encounter: Payer: Self-pay | Admitting: Family Medicine

## 2010-08-17 ENCOUNTER — Ambulatory Visit (INDEPENDENT_AMBULATORY_CARE_PROVIDER_SITE_OTHER): Payer: Medicare Other | Admitting: Family Medicine

## 2010-08-17 DIAGNOSIS — Z9181 History of falling: Secondary | ICD-10-CM | POA: Insufficient documentation

## 2010-08-17 DIAGNOSIS — G8929 Other chronic pain: Secondary | ICD-10-CM

## 2010-08-21 ENCOUNTER — Telehealth: Payer: Self-pay | Admitting: Family Medicine

## 2010-08-23 ENCOUNTER — Encounter: Payer: Self-pay | Admitting: Family Medicine

## 2010-08-24 NOTE — Assessment & Plan Note (Signed)
Summary: Follow up   Vital Signs:  Patient profile:   77 year old female Height:      61 inches Weight:      211 pounds BMI:     40.01 Temp:     97.8 degrees F oral Pulse rate:   88 / minute Pulse rhythm:   regular BP sitting:   152 / 76  (left arm) Cuff size:   large  Vitals Entered By: Delilah Shan CMA  Dull) (August 17, 2010 3:28 PM) CC: Follow up   History of Present Illness: labs reviewed with the patient.  Pain continues.  Lidoderm patches didn't help much with the pain.  Shoulder, back and leg pain continues.  Pain continues below the waist and into the legs.  Pain with walking.  R>L leg pain.  She feels unsteady due to the pain.  She doesn't want to have surgery.  She is open to physical therapy.  She doesn't want to pursue pain clinic tx as she has not had good experiences with this previously.    She has complicated history with likely arthritic, neuropathic and muscular/fibromalgia pain sources.  she is on a bowel regimen with daily stool softener and as needed laxatives.  She is scared of falling and likely has fall risk due to gait changes related to pain.  She feels as though her muscle mass has decreased due to lack of exercise/resistance.  She is O2 dependent and travel is very difficult for patient.  She does get some relief in pain from the percocet, lyrica and cymbalta.  We had a long discussion today about polypharmacy and the goal to prevent side effects.  She is tolerating the meds currently but the percocet doesn't last 6 hours for the patieint.  She is asking for advice.   Allergies: 1)  ! Sulfadiazine (Sulfadiazine) 2)  ! Oxycontin  Review of Systems       See HPI.  Otherwise negative.    Physical Exam  General:  GEN: nad, alert and oriented, sore from back pain, on O2 HEENT: mucous membranes moist NECK: supple w/o LA, no TMG CV: rrr PULM: On O2, occ exp wheeze, no inc wob ABD: soft, +bs EXT: no edema Back: lumbar spine tender to palpation in  midline and in paraspinal area.  this is in a horizontal distribution below the waistline- similar to prev.  no rash.  Distal sensation intact for bilateral lower extremities w/o foot drop or focal weakness.  R shoulder with painful range of motion    Impression & Recommendations:  Problem # 1:  PAIN, CHRONIC NEC (ICD-338.29) I would take the percocet q5h and see if this helps her pain.  She could increase to q4h with sedation caution if no relief and she'll call back with an update.  She is aware of the risks with this- esp sedation and constipation- and would like to proceed.  Her husband is also aware.  She'll need to continue bowel regimen and we discussed this.  I think she would benefit from physical therapy eval for fall risk and muscle preservation.  She would be a candidate for [facility] eval given the travel difficulty for her.   Problem # 2:  RISK OF FALLING (ICD-V15.88) See above.  Orders: Home Health Referral (Home Health)  Complete Medication List: 1)  Levoxyl 112 Mcg Tabs (Levothyroxine sodium) .... One tab by mouth once daily 2)  Vitamin D3 1000 Unit Caps (Cholecalciferol) .... Take 1 capsule by mouth once a day  3)  Vitamin B-12 1000 Mcg Tabs (Cyanocobalamin) .... Take 1 tablet by mouth once a day 4)  One Daily Multiple Vitamin Tabs (Multiple vitamin) .Marland Kitchen.. 1 by mouth daily 5)  Klor-con 10 10 Meq Tbcr (Potassium chloride) .... Take 1-2 tablet by mouth once a day 6)  Caltrate 600 Tabs (Calcium carbonate tabs) .Marland Kitchen.. 1 by mouth daily 7)  Oxygen  .Marland Kitchen.. 3 l/min continuously 8)  Spiriva Handihaler 18 Mcg Caps (Tiotropium bromide monohydrate) .... Inhale contents of 1 capsule once a day 9)  Advair Hfa 230-21 Mcg/act Aero (Fluticasone-salmeterol) .... Two puffs twice daily 10)  Fluticasone Propionate 50 Mcg/act Susp (Fluticasone propionate) .... 2 puffs each nostril qd and at bedtime 11)  Ranitidine Hcl 300 Mg Tabs (Ranitidine hcl) .Marland Kitchen.. 1 tablet twice a day 12)  Lyrica 50 Mg Caps (Pregabalin)  .... One tab by mouth two times a day 13)  Cymbalta 60 Mg Cpep (Duloxetine hcl) .... One tab by mouth once daily 14)  Norvasc 2.5 Mg Tabs (Amlodipine besylate) .... Take 1 tablet by mouth once a day 15)  Alprazolam 1 Mg Tabs (Alprazolam) .... 1/2 to 1 tablet twice a day as needed for stress only 16)  Ambien 10 Mg Tabs (Zolpidem tartrate) .... One tab by mouth at night as needed insomnia 17)  Imitrex 50 Mg Tabs (Sumatriptan succinate) .Marland Kitchen.. 1 at onset of headache 18)  Proair Hfa 108 (90 Base) Mcg/act Aers (Albuterol sulfate) .... 2 puffs every 4 hours as needed 19)  Percocet 7.5-325 Mg Tabs (Oxycodone-acetaminophen) .Marland Kitchen.. 1 by mouth 3-4 times a day for pain 20)  Omeprazole 20 Mg Cpdr (Omeprazole) .... One by mouth two times a day  Patient Instructions: 1)  See Shirlee Limerick about your referral before your leave today.   2)  I would take the percocet every 5 hours and see if that helps with the pain.  If you need to, you can take it every 4 hours.  Keep taking your stool softener once daily and take the laxitive as needed.  Call me with an update next week.    Orders Added: 1)  Est. Patient Level IV [81191] 2)  Home Health Referral [Home Health]    Current Allergies (reviewed today): ! SULFADIAZINE (SULFADIAZINE) ! OXYCONTIN

## 2010-08-29 ENCOUNTER — Telehealth: Payer: Self-pay | Admitting: Family Medicine

## 2010-08-30 NOTE — Progress Notes (Signed)
Summary: Do not want P/T scheduled now...  Phone Note Outgoing Call   Summary of Call: Sp w/ pts husband, says pt is having severe pain. Pt will be going to her [facility] for follow up. Pt will hold off on her Physical Therapy.Marland KitchenMarland KitchenPt will keep Dr. Para March updated.Daine Gip  August 21, 2010 10:26 AM   Initial call taken by: Daine Gip,  August 21, 2010 10:26 AM  Follow-up for Phone Call        noted, appreciate update. Will await recs.  Crawford Givens MD  August 21, 2010 12:09 PM

## 2010-09-05 NOTE — Progress Notes (Signed)
Summary: percocet  Phone Note Refill Request Call back at Home Phone [phone] Message from:  Patient on August 29, 2010 1:48 PM  Refills Requested: Medication #1:  PERCOCET 7.5-325 MG TABS 1 by mouth 3-4 times a day for pain  Method Requested: Pick up at Office Initial call taken by: Melody Comas,  August 29, 2010 1:48 PM  Follow-up for Phone Call        please have them pick up.  Please get records from her appointment from ortho and verify that they didn't change her meds.  thanks.  Follow-up by: Crawford Givens MD,  August 29, 2010 1:54 PM  Additional Follow-up for Phone Call Additional follow up Details #1::        Patient Advised.  Prescription left at front desk.   Patient saw Dr. Dion Saucier.  She said they did not change any medications.  Dr. Dion Saucier gave her a shot in her knee and it helped tremendously.  I will contact them for records. Additional Follow-up by: Delilah Shan CMA Dasani Thurlow Dull),  August 29, 2010 2:14 PM    Additional Follow-up for Phone Call Additional follow up Details #2::    noted. Crawford Givens MD  August 29, 2010 2:17 PM   Prescriptions: PERCOCET 7.5-325 MG TABS (OXYCODONE-ACETAMINOPHEN) 1 by mouth 3-4 times a day for pain  #100 x 0   Entered and Authorized by:   Crawford Givens MD   Signed by:   Crawford Givens MD on 08/29/2010   Method used:   Print then Give to Patient   RxID:   [id number]

## 2010-09-14 NOTE — Letter (Signed)
Summary: [facility]  [facility]   Imported By: Maryln Gottron 09/06/2010 11:09:38  _____________________________________________________________________  External Attachment:    Type:   Image     Comment:   External Document

## 2010-09-26 ENCOUNTER — Other Ambulatory Visit: Payer: Self-pay | Admitting: *Deleted

## 2010-09-26 MED ORDER — ZOLPIDEM TARTRATE 10 MG PO TABS: 10.0000 mg | ORAL_TABLET | Freq: Every evening | ORAL | Status: DC | PRN

## 2010-09-26 NOTE — Telephone Encounter (Signed)
Pt requests 90 for [facility].

## 2010-09-26 NOTE — Telephone Encounter (Signed)
Called to [facility] 

## 2010-10-04 ENCOUNTER — Other Ambulatory Visit: Payer: Self-pay | Admitting: *Deleted

## 2010-10-04 MED ORDER — OXYCODONE-ACETAMINOPHEN 7.5-325 MG PO TABS: ORAL_TABLET | ORAL | Status: DC

## 2010-10-04 NOTE — Telephone Encounter (Signed)
Patient advised.  Rx. Left at front desk.

## 2010-10-04 NOTE — Telephone Encounter (Signed)
Please notify pt to pick up rx.  Sedation caution as always with this medicine and continue to monitor for constipation.

## 2010-11-08 ENCOUNTER — Other Ambulatory Visit: Payer: Self-pay | Admitting: *Deleted

## 2010-11-08 MED ORDER — OXYCODONE-ACETAMINOPHEN 7.5-325 MG PO TABS: ORAL_TABLET | ORAL | Status: DC

## 2010-11-08 NOTE — Telephone Encounter (Signed)
Please  give to patient

## 2010-11-08 NOTE — Telephone Encounter (Signed)
Patient advised.  Rx. Left at front desk for pick up. 

## 2010-11-10 ENCOUNTER — Encounter: Payer: Self-pay | Admitting: Critical Care Medicine

## 2010-11-14 ENCOUNTER — Ambulatory Visit (INDEPENDENT_AMBULATORY_CARE_PROVIDER_SITE_OTHER): Payer: Medicare Other | Admitting: Critical Care Medicine

## 2010-11-14 ENCOUNTER — Telehealth: Payer: Self-pay | Admitting: *Deleted

## 2010-11-14 ENCOUNTER — Encounter: Payer: Self-pay | Admitting: Critical Care Medicine

## 2010-11-14 VITALS — BP 120/84 | HR 76 | Temp 97.8°F | Ht 63.0 in | Wt 210.4 lb

## 2010-11-14 DIAGNOSIS — J45909 Unspecified asthma, uncomplicated: Secondary | ICD-10-CM

## 2010-11-14 MED ORDER — PREDNISONE 10 MG PO TABS: ORAL_TABLET | ORAL | Status: DC

## 2010-11-14 NOTE — Progress Notes (Signed)
Subjective:    Patient ID: [name], female    DOB: March 14, 1931, 77 y.o.   MRN: [id number]  HPI 77 y.o.WF Notes more runny nose and cough with yellow mucus for two weeks.  Now off allergy immunotherapy. Notes more dyspnea .  No f/c/s.    Past Medical History  Diagnosis Date  . Osteoporosis, unspecified   . Allergic rhinitis, cause unspecified   . Other chronic pain   . Other abnormal blood chemistry   . Insomnia, unspecified   . Acute bronchitis   . Need for prophylactic hormone replacement therapy (postmenopausal)   . Degeneration of intervertebral disc, site unspecified   . Allergy, unspecified not elsewhere classified   . Myalgia and myositis, unspecified   . Unspecified essential hypertension   . Other and unspecified hyperlipidemia   . Esophageal reflux   . Diverticulosis of colon (without mention of hemorrhage)   . Unspecified hypothyroidism      Family History  Problem Relation Age of Onset  . Lung cancer Brother     x2  . Hypertension Father   . Heart disease Mother   . Asthma Sister      History   Social History  . Marital Status: Married    Spouse Name: N/A    Number of Children: N/A  . Years of Education: N/A   Occupational History  . Housewife    Social History Main Topics  . Smoking status: Former Smoker -- 1.5 packs/day for 10 years    Types: Cigarettes    Quit date: 07/09/1985  . Smokeless tobacco: Never Used   Comment: 1 1/2 ppd x 20 years  . Alcohol Use: Not on file  . Drug Use: Not on file  . Sexually Active: Not on file   Other Topics Concern  . Not on file   Social History Narrative  . No narrative on file     Allergies  Allergen Reactions  . Sulfadiazine     REACTION: Fever, achey     Outpatient Prescriptions Prior to Visit  Medication Sig Dispense Refill  . albuterol (PROAIR HFA) 108 (90 BASE) MCG/ACT inhaler Inhale 2 puffs into the lungs every 6 (six) hours as needed.        . ALPRAZolam (XANAX) 1 MG tablet 1/2 to 1  twice a day as needed       . amLODipine (NORVASC) 2.5 MG tablet Take 2.5 mg by mouth daily.        . Calcium Carbonate (CALTRATE 600 PO) Take 1 tablet by mouth daily.        . Cholecalciferol (VITAMIN D3) 1000 UNITS CAPS Take 1 capsule by mouth daily.        . DULoxetine (CYMBALTA) 60 MG capsule Take 60 mg by mouth daily.        . fluticasone (VERAMYST) 27.5 MCG/SPRAY nasal spray 2 sprays by Nasal route daily.        . fluticasone-salmeterol (ADVAIR HFA) 230-21 MCG/ACT inhaler Inhale 2 puffs into the lungs 2 (two) times daily.        Marland Kitchen levothyroxine (SYNTHROID, LEVOTHROID) 112 MCG tablet Take 112 mcg by mouth daily.        . Multiple Vitamin (MULTIVITAMIN) capsule Take 1 capsule by mouth daily.        Marland Kitchen omeprazole (PRILOSEC) 20 MG capsule Take 20 mg by mouth daily.        Marland Kitchen oxyCODONE-acetaminophen (PERCOCET) 7.5-325 MG per tablet Take one by mouth 3 to 4 times  a day for pain  100 tablet  0  . potassium chloride (KLOR-CON) 10 MEQ CR tablet 1-2 tablets by mouth once a day       . pregabalin (LYRICA) 50 MG capsule Take 50 mg by mouth 2 (two) times daily.        . SUMAtriptan (IMITREX) 50 MG tablet Take 50 mg by mouth every 2 (two) hours as needed.        . tiotropium (SPIRIVA) 18 MCG inhalation capsule Place 18 mcg into inhaler and inhale daily.        . vitamin B-12 (CYANOCOBALAMIN) 1000 MCG tablet Take 1,000 mcg by mouth daily.        Marland Kitchen zolpidem (AMBIEN) 10 MG tablet Take 1 tablet (10 mg total) by mouth at bedtime as needed for sleep.  90 tablet  0  . ranitidine (ZANTAC) 300 MG tablet Take 300 mg by mouth 2 (two) times daily.            Review of Systems Constitutional:   No  weight loss, notes night sweats,  Fevers, chills, fatigue, lassitude. HEENT:   No headaches,  Difficulty swallowing,  Tooth/dental problems,  Sore throat,                No sneezing, itching, ear ache, nasal congestion, post nasal drip,   CV:  No chest pain,  Orthopnea, PND, swelling in lower extremities, anasarca,  dizziness, palpitations  GI  Notes  heartburn, indigestion, abdominal pain, nausea, vomiting, diarrhea, change in bowel habits, loss of appetite  Resp: Notes  shortness of breath with exertion not at rest.  Notes  excess mucus, notes  productive cough,  No non-productive cough,  No coughing up of blood.  No change in color of mucus.  Notes some wheezing.  No chest wall deformity  Skin: no rash or lesions.  GU: no dysuria, change in color of urine, no urgency or frequency.  No flank pain.  MS:  No joint pain or swelling.  No decreased range of motion.  No back pain.  Psych:  No change in mood or affect. No depression or anxiety.  No memory loss.     Objective:   Physical Exam Filed Vitals:   11/14/10 1056  BP: 120/84  Pulse: 76  Temp: 97.8 F (36.6 C)  TempSrc: Oral  Height: 5\' 3"  (1.6 m)  Weight: 210 lb 6.4 oz (95.437 kg)  SpO2: 96%    Gen: Pleasant, well-nourished, in no distress,  normal affect  ENT: No lesions,  mouth clear,  oropharynx clear, no postnasal drip  Neck: No JVD, no TMG, no carotid bruits  Lungs: No use of accessory muscles, no dullness to percussion, exp wheeze  Cardiovascular: RRR, heart sounds normal, no murmur or gallops, no peripheral edema  Abdomen: soft and NT, no HSM,  BS normal  Musculoskeletal: No deformities, no cyanosis or clubbing  Neuro: alert, non focal  Skin: Warm, no lesions or rashes        Assessment & Plan:   REACTIVE AIRWAY DISEASE Severe persistent asthma with allergic flare Plan Pulse prednisone No change in inhaled or maintenance medications. Return in  2  months      Updated Medication List Outpatient Encounter Prescriptions as of 11/14/2010  Medication Sig Dispense Refill  . albuterol (PROAIR HFA) 108 (90 BASE) MCG/ACT inhaler Inhale 2 puffs into the lungs every 6 (six) hours as needed.        . ALPRAZolam (XANAX) 1 MG tablet 1/2 to  1 twice a day as needed       . amLODipine (NORVASC) 2.5 MG tablet Take 2.5  mg by mouth daily.        . Calcium Carbonate (CALTRATE 600 PO) Take 1 tablet by mouth daily.        . Cholecalciferol (VITAMIN D3) 1000 UNITS CAPS Take 1 capsule by mouth daily.        . DULoxetine (CYMBALTA) 60 MG capsule Take 60 mg by mouth daily.        . fluticasone (VERAMYST) 27.5 MCG/SPRAY nasal spray 2 sprays by Nasal route daily.        . fluticasone-salmeterol (ADVAIR HFA) 230-21 MCG/ACT inhaler Inhale 2 puffs into the lungs 2 (two) times daily.        Marland Kitchen levothyroxine (SYNTHROID, LEVOTHROID) 112 MCG tablet Take 112 mcg by mouth daily.        . Multiple Vitamin (MULTIVITAMIN) capsule Take 1 capsule by mouth daily.        Marland Kitchen omeprazole (PRILOSEC) 20 MG capsule Take 20 mg by mouth daily.        Marland Kitchen oxyCODONE-acetaminophen (PERCOCET) 7.5-325 MG per tablet Take one by mouth 3 to 4 times a day for pain  100 tablet  0  . potassium chloride (KLOR-CON) 10 MEQ CR tablet 1-2 tablets by mouth once a day       . pregabalin (LYRICA) 50 MG capsule Take 50 mg by mouth 2 (two) times daily.        . SUMAtriptan (IMITREX) 50 MG tablet Take 50 mg by mouth every 2 (two) hours as needed.        . tiotropium (SPIRIVA) 18 MCG inhalation capsule Place 18 mcg into inhaler and inhale daily.        . vitamin B-12 (CYANOCOBALAMIN) 1000 MCG tablet Take 1,000 mcg by mouth daily.        Marland Kitchen zolpidem (AMBIEN) 10 MG tablet Take 1 tablet (10 mg total) by mouth at bedtime as needed for sleep.  90 tablet  0  . predniSONE (DELTASONE) 10 MG tablet Take 4 for three days, 3 for three days, 2 for three days, 1 for three days and stop   30 tablet  0  . DISCONTD: ranitidine (ZANTAC) 300 MG tablet Take 300 mg by mouth 2 (two) times daily.

## 2010-11-14 NOTE — Patient Instructions (Signed)
Start prednisone 10mg  Take 4 for three days, 3 for three days, 2 for three days, 1 for three days and stop No other medication changes Return 2 months

## 2010-11-14 NOTE — Assessment & Plan Note (Addendum)
Severe persistent asthma with allergic flare Plan Pulse prednisone No change in inhaled or maintenance medications. Return in  2  months

## 2010-11-15 NOTE — Telephone Encounter (Signed)
I called [name] and told her you said it had been too long she would need to make an appt. To see you. She said she would make an appt. Hopefully she will.

## 2010-11-15 NOTE — Telephone Encounter (Signed)
That is too long. I should see her before we decide about resuming shots.

## 2010-11-21 NOTE — H&P (Signed)
[name], [name] NO.:  [id number]   MEDICAL RECORD NO.:  [id number]           PATIENT TYPE:   LOCATION:                                 FACILITY:   PHYSICIAN:  Ellwood Dense, M.D.   DATE OF BIRTH:  1931-06-13   DATE OF ADMISSION:  03/11/2008  DATE OF DISCHARGE:                              HISTORY & PHYSICAL   ATTENDING PHYSICIAN:  Ranelle Oyster, M.D.   PRIMARY CARE PHYSICIAN:  Dr. Serita Butcher at [facility].   CRITICAL CARE:  Dr. Delford Field.   PSYCHIATRIST:  Antonietta Breach, M.D.   NEUROLOGIST:  Deanna Artis. Sharene Skeans, M.D.   HISTORY OF PRESENT ILLNESS:  Ms. [name] is a 77 year old Caucasian  female with a history of fibromyalgia and O2 dependent COPD.  She also  has had multiple orthopedic procedures in the past.   The patient was admitted March 08, 2008, with altered mental status and  impaired gait.  MRI study was negative for acute abnormalities.  MRA  study showed atherosclerotic changes with no other abnormalities.  A  urine drug screen was positive for opiates that she uses for chronic  knee pain.  Carotid Dopplers were negative for internal carotid artery  stenosis.  Echocardiogram showed an ejection fraction of 60% without  emboli.  Dr. Sharene Skeans from neurology was consulted and suspected a TIA.  No change in her medications has been made for stroke prophylaxis.  Subcu Lovenox was added for DVT prophylaxis.  The patient was seen by  Dr. Jeanie Sewer from psychiatry and there was a diagnosis of questionable  dementia with a trial of Aricept started.  There has been close  monitoring of chronic pain medicines including Percocet, Lyrica and  Celebrex.  She remains on chronic O2 for COPD.   The patient was evaluated by the rehabilitation physicians and felt to  be an appropriate candidate for inpatient rehabilitation.   REVIEW OF SYSTEMS:  Positive for shortness of breath, cough, joint  swelling, lumbago, anxiety and depression.   PAST MEDICAL  HISTORY:  1. Fibromyalgia.  2. COPD with O2 at 3 liters by nasal cannula.  3. Hypothyroidism.  4. Hypertension.  5. Dyslipidemia.  6. Multiple orthopedic procedures.  7. Anxiety/depression.   FAMILY HISTORY:  Positive for coronary artery disease.   SOCIAL HISTORY:  The patient lives with her husband who can reportedly  assist as needed.  He reportedly has peripheral arterial disease but is  ambulatory.  They live in a one level home with a ramp to enter.  The  husband has been managing the patient's medicines but the patient's  daughter lives locally and plans to assist at discharge.  The patient  does not use alcohol and does have a history of tobacco usage.   FUNCTION HISTORY PRIOR TO ADMISSION:  Independent using a rolling cane.   ALLERGIES:  SULFA.   MEDICATIONS PRIOR TO ADMISSION:  1. Serevent 1 puff b.i.d.  2. Flovent 2 puffs b.i.d.  3. Spiriva 18 mcg daily.  4. Percocet p.r.n.  5. Zantac 300 mg b.i.d.  6. Ambien daily at bedtime p.r.n.  7. Cymbalta  60 mg daily.  8. Zocor 80 mg daily at bedtime.  9. Lyrica 50 mg b.i.d.  10.Celebrex 200 mg daily.  11.Norvasc 2.5 mg daily.  12.Lasix 20 mg daily.  13.Potassium chloride daily.  14.Xanax 0.5 mg p.r.n.  15.Zestril 15 mg daily at bedtime p.r.n.  16.Synthroid 100 mcg p.o. daily.   LABORATORY:  Recent hemoglobin was 11.0 with hematocrit of 33.0,  platelet count of 171,000.  White count of 5.1.  Recent sodium is 142,  potassium 3.5, chloride 102, bicarbonate 34, BUN 14 and creatinine was  0.5.  Recent TSH was normal at 3.994.   PHYSICAL EXAM:  GENERAL:  A well-appearing, overweight adult female  lying in bed in mild to no acute discomfort.  VITAL SIGNS:  Blood pressure 137/87 with pulse of 82, respiratory rate  18 and temperature 97.8  HEENT:  Normocephalic, nontraumatic.  CARDIOVASCULAR:  Rate regular rate and rhythm.  S1-S2 without murmurs.  ABDOMEN:  Soft, obese, nontender with positive bowel sounds.  LUNGS:  Were  clear to auscultation bilaterally without rhonchi, wheezes  or rales.  NEUROLOGIC:  Alert and oriented x2 to x3 with minimal cues.  The patient  has extraocular muscles which were intact and facial symmetry is intact.  Tongue was midline.  Bilateral upper extremity exam showed 4+/5 strength  throughout.  Bulk and tone were normal.  Reflexes were 2+ and  symmetrical.  Sensation was intact to light touch throughout the  bilateral upper extremities.  Lower extremity exam showed 4-/5 strength  in hip flexion, knee extension and ankle dorsiflexion.  Sensation was  intact to light touch with the bilateral lower extremities.   DIAGNOSES:  1. Transient ischemic attack with recent diagnosis of mild dementia.  2. History of fibromyalgia previously treated with Lyrica, Celebrex      and oxycodone immediate release.  3. Hypertension.  4. O2 dependent chronic obstructive pulmonary disease.  5. Anxiety/depression on p.r.n. Xanax.  6. Hypothyroidism on Synthroid.  7. Dyslipidemia on daily Zocor.   PLAN:  The patient will be admitted to receive collaborative  interdisciplinary care between the physiatrist, rehab nursing staff and  therapy team.  The patient's level of medical complexity and substantial  therapy needs in context with that medical necessity cannot be provided  at a lesser intensity of care.  The physiatrist will provide 24 hour  management of medical needs as well as oversight of the therapy  plan/treatment and provide guidance as appropriate regarding the  interaction of the two.  [facility] rehab nursing will assist in  management of bowel and bladder and help integrate therapy concepts,  techniques and education.  Physical therapy will assess and treat for  range of motion, strengthening, bed mobility, transfers, pre-gait  training, gait training and equipment.  Occupational therapy will assess  and treat for range of motion, strengthening, ADLs,  cognitive/perceptional training,  splinting and equipment evaluation.  Speech therapy will assess and treat for dysphagia, oral motor exercise,  communication aids, vital-stim.  Case management and social work will  assess and treat for psychosocial issues and discharge planning as  appropriate.  Team conferences will be held weekly to establish goals,  assess progress and determine barriers to discharge.   ESTIMATED LENGTH OF STAY:  Five to 10 days.   GOALS:  Modified independence, ADLs, transfers and ambulation.   PROGNOSIS:  Good.           ______________________________  Ellwood Dense, M.D.     DC/MEDQ  D:  03/11/2008  T:  03/11/2008  Job:  [id number]

## 2010-11-21 NOTE — Op Note (Signed)
[name], [name]               ACCOUNT NO.:  [id number]   MEDICAL RECORD NO.:  [id number]          PATIENT TYPE:  OIB   LOCATION:  5002                         FACILITY:  [facility]   PHYSICIAN:  Eulas Post, MD    DATE OF BIRTH:  11/09/1930   DATE OF PROCEDURE:  11/03/2007  DATE OF DISCHARGE:                               OPERATIVE REPORT   PREOPERATIVE DIAGNOSIS:  Left medial and lateral meniscus tears.   OPERATIVE PROCEDURE:  Left partial medial, and lateral meniscectomy.   ANESTHESIA:  General.   ESTIMATED BLOOD LOSS:  Minimal.   PREOPERATIVE INDICATION:  Ms. [name] is a 77 year old woman who  is an oxygen-dependant patient with severe COPD and debilitating knee  pain who wished to undergo the above named procedure.  The risks,  benefits, and alternatives including but not limited to risks of  infection, bleeding, nerve injury, knee stiffness, loss of function,  recurrence of pain, inadequate pain relief, recurrence of meniscal  tears, progression of arthritis among others, and she was willing to  proceed.   Operative findings:  The patellofemoral joint was relatively well  maintained.  The chondral surfaces of the medial and lateral  compartments had grade 2 changes.  There were no full-thickness chondral  defects. There was significant, complex tearing of the lateral meniscus  from the posterior horn around the body and even up under the anterior  horn.  Medial meniscus also had complex tearing in the mid body, as well  as, the posterior horn.  The MCL was extremely tight at the beginning of  the procedure, and progressively relaxant with 1 give-way point during  the course of the procedure which made at getting access to the medial  meniscus easier.   OPERATIVE PROCEDURE:  The patient was brought to the operating room and  placed in supine position.  General anesthesia was administered.  A 2 g  intravenous Ancef was given.  The left lower extremity was prepped  and  draped in the usual sterile fashion.  Diagnostic arthroscopy was carried  out with the above named findings.  The medial posterior region of the  knee was extremely tight and difficult to access until the MCL was  loosened.  The shaver and the arthroscopic biters were used to debride  the posterior horn of the medial meniscus, as well as the body, and as  well as the meniscus on the lateral side.  Satisfactory debridement was  achieved and debridement was also carried using the contralateral  portal.  Wounds were irrigated copiously, and the patient was awakened  after closure with Monocryl and Steri-Strips.  A knee immobilizer was  placed.  She will likely have somewhat of a prolonged recovery given the  MCL stretch.  We are going to plan to let her be weightbearing as  tolerated using the knee immobilizer, and I will see her back in 2  weeks.      Eulas Post, MD  Electronically Signed    JPL/MEDQ  D:  11/03/2007  T:  11/04/2007  Job:  [id number]

## 2010-11-21 NOTE — Discharge Summary (Signed)
NAMEKWYNN, SCHLOTTER               ACCOUNT NO.:  [id number]   MEDICAL RECORD NO.:  [id number]          PATIENT TYPE:  IPS   LOCATION:  4001                         FACILITY:  [facility]   PHYSICIAN:  Ranelle Oyster, M.D.DATE OF BIRTH:  May 12, 1931   DATE OF ADMISSION:  03/11/2008  DATE OF DISCHARGE:  03/17/2008                               DISCHARGE SUMMARY   DISCHARGE DIAGNOSES:  1. Gait disorder with transient ishemic attack.  2. Fibromyalgia.  3. Hypertension.  4. Chronic obstructive pulmonary disease.  5. Anxiety with depression.  6. Hypothyroidism.  7. Hyperlipidemia.  8. Urinary retention.  9. Subcutaneous Lovenox for deep vein thrombosis prophylaxis.   This is a 77 year old female with history of fibromyalgia and chronic  oxygen for chronic obstructive pulmonary disease, admitted on March 08, 2008, with altered mental status and decreased in ambulation, MRI with  no acute changes, and MRA with atherosclerotic changes.  Urine drug  screen positive for opiates.  Carotid Dopplers negative.  Echocardiogram  with ejection fraction of 60% without emboli.  Neurology Service, Dr.  Sharene Skeans, suspect transient ischemic attack.  Psychiatry Service, Dr.  Jeanie Sewer, placed on trial of Aricept as well as workup for questionable  early dementia.  Subcutaneous Lovenox for deep vein thrombosis  prophylaxis.  Close monitoring of chronic pain medications with her  history of fibromyalgia.  She remained on oxygen as advised for chronic  obstructive pulmonary disease.  She was admitted for comprehensive rehab  program.   PAST MEDICAL HISTORY:  See discharge diagnoses.  She does have a history  of tobacco use.  Denies alcohol.   ALLERGIES:  SULFA.   SOCIAL HISTORY:  Lives with husband, husband with limited assistance,  one-level home with a ramp, daughter in area.   FUNCTIONAL HISTORY PRIOR TO ADMISSION:  Independent with a cane.   FUNCTIONAL STATUS ON ADMISSION:  Functional status upon  admission to  Rehab Services was minimal assistance, 80 feet with a rolling walker.   MEDICATIONS PRIOR TO ADMISSION:  1. Serevent twice daily.  2. Flovent 2 puffs twice daily.  3. Spiriva 18 mcg daily.  4. Percocet as needed.  5. Zantac 300 mg twice daily.  6. Celebrex 200 mg daily.  7. Lyrica 50 mg twice daily.  8. Zocor 80 mg at bedtime.  9. Cymbalta 60 mg daily.  10.Ambien at bedtime.  11.Norvasc 2.5 mg daily.  12.Lasix 20 mg daily.  13.Potassium chloride daily.  14.Xanax 0.5 mg as needed.  15.Restoril 15 mg at bedtime as needed.  16.Synthroid 100 mcg daily.  17.Albuterol inhaler as needed.   PHYSICAL EXAMINATION:  GENERAL:  This is an alert female.  PSYCHIATRIC:  She was able to follow 3-step commands, names, person,  place, as well as date of birth.  EXTREMITIES:  2+ for deep tendon reflexes.  Calves cool without any  swelling or erythema, and nontender.  LUNGS:  Decreased breath sounds, clear to auscultation.  ABDOMEN:  Obese, soft, and nontender.  Good bowel sounds.  CARDIAC:  Regular rate and rhythm.   REHABILITATION HOSPITAL COURSE:  The patient was admitted to Inpatient  Rehab Services with therapies initiated on a 3-hour daily basis  consisting of physical therapy, occupational therapy, and rehabilitation  nursing.  The following issues were addressed during the patient's  rehabilitation stay.  Pertaining to Ms. Garguilo transient ischemic  attack with gait disorder, she continued to progress nicely in all areas  of functional mobility.  She was supervision for stairs, ambulating  extended distances, strength and endurance are greatly improved.  She  was advised no driving, no smoking, and no alcohol.  She had a long  history of fibromyalgia.  She is now on Lyrica 25 mg twice daily.  She  remained on her Celebrex as prior to admission.  She was using oxycodone  for breakthrough pain only.  Again, she did have a urine drug screen  positive for opiates on  admission.  It was advised to family for  dispensing of medications.  It was felt that the patient may have been  overdosing some pain medications prior to admission.  Her blood  pressures were well controlled on Lasix and Norvasc with no orthostatic  changes.  She remained on nebulizer treatments for chronic obstructive  pulmonary disease as well as chronic oxygen.  She did have a documented  history of anxiety and depression followed by Dr. Jeanie Sewer.  She was on  Cymbalta as well as Xanax with the addition of Aricept for questionable  early dementia.  She remained on Synthroid for hypothyroidism.  Zocor  for hyperlipidemia.  She was on subcutaneous Lovenox for deep vein  thrombosis prophylaxis throughout her rehab course.   LATEST LABORATORY DATA:  Hemoglobin of 13.1, hematocrit 38.2, and  platelet 213,000.  Sodium 142, potassium 3.0, BUN 8, and creatinine 0.7.  Weekly interdisciplinary team conferences were held to discuss the  patient's estimated length of stay as well as any barriers to discharge.  Full family teaching was completed.  It was noted during the  rehabilitation course she did have some urinary retention.  Dr. Vernie Ammons  of Urology Service was consulted.  It was advised that a Foley catheter  tube should be placed.  She did undergo a CT of the pelvis that showed  normal appearance.  No significant change since prior study of 2006.  She would remain with Foley catheter tube and to follow up in the office  of Dr. Vernie Ammons.   DISCHARGE MEDICATIONS AT THE TIME OF DICTATION:  1. Lyrica 25 mg twice daily.  2. Zocor 80 mg at bedtime.  3. Spiriva 18 mcg daily.  4. Lasix 20 mg daily.  5. Flovent 220 mcg 1 puff twice daily.  6. Serevent 50 mcg Diskus 1 puff twice daily.  7. Protonix 40 mg daily.  8. Synthroid 100 mcg daily.  9. Norvasc 5 mg daily.  10.Celebrex 200 mg daily.  11.Cymbalta 60 mg daily.  12.Aricept 5 mg at bedtime.  13.Flonase 0.05% 2 sprays daily.  14.Ambien  10 mg at bedtime.  15.Potassium chloride 20 mEq daily.  16.Pyridium 200 mg 3 times daily until March 17, 2008, and stop.  17.Oxycodone immediate release 5 mg p.o. every 6 hours as needed for      pain, dispense of 60 tablets.  18.Albuterol inhaler 1-2 puffs every 2 hours as needed for shortness      of breath.   DIET:  Regular.   SPECIAL INSTRUCTIONS:  No drinking, no driving, and no smoking.  Follow  up with Dr. Vernie Ammons, Urology Services with Foley catheter tube remained  in place at this time.  Continue chronic oxygen as prior to hospital  admission.      Mariam Dollar, P.A.      Ranelle Oyster, M.D.  Electronically Signed    DA/MEDQ  D:  03/16/2008  T:  03/17/2008  Job:  [id number]   cc:   Arta Silence, MD  Charlcie Cradle. Delford Field, MD, FCCP  Deanna Artis. Sharene Skeans, M.D.  Veverly Fells. Vernie Ammons, M.D.  Antonietta Breach, M.D.

## 2010-11-21 NOTE — Assessment & Plan Note (Signed)
[name]                             PULMONARY OFFICE NOTE   [name], [name]                      MRN:          [id number]  DATE:04/15/2007                            DOB:          1930-11-04    HISTORY:  [name] is a 77 year old white female with a history of  chronic obstructive lung disease and  asthmatic bronchitis.  The patient  has noted increased shortness of breath and increased cough.  She took a  round of prednisone and Levaquin in September.  Now she is worse again,  coughing up tan mucus.  She maintains Serevent one spray twice daily,  Flovent 220 mcg, two sprays twice daily and Spiriva daily.   PHYSICAL EXAMINATION:  VITAL SIGNS:  Temperature 98 degrees, blood  pressure 130/80, pulse 83, saturation 95% on 3 liters.  CHEST:  Diminished breath sounds with prolonged expiratory phase.  No  wheeze or rhonchi noted.  HEART:  A regular rate and rhythm without S3.  Normal S1 and S2.  ABDOMEN:  Soft, nontender.  EXTREMITIES:  No edema or clubbing.  SKIN:  Clear.   IMPRESSION:  Chronic obstructive airway disease with increased  bronchitic flare.   PLAN:  For the patient to receive Factive 320 mg daily for five days.  She will also receive repulse prednisone 40 mg daily, with a slow taper.  I will see the patient back in followup in six weeks.     Alice Cradle Delford Field, MD, [facility]  Electronically Signed    PEW/MedQ  DD: 04/15/2007  DT: 04/15/2007  Job #: [id number]   cc:   Arta Silence, MD

## 2010-11-21 NOTE — Consult Note (Signed)
[name], [name]               ACCOUNT NO.:  [id number]   MEDICAL RECORD NO.:  [id number]          PATIENT TYPE:  INP   LOCATION:  3022                         FACILITY:  [facility]   PHYSICIAN:  Antonietta Breach, M.D.       DATE OF BIRTH:   DATE OF CONSULTATION:  03/09/2008  DATE OF DISCHARGE:                                 CONSULTATION   REQUESTING PHYSICIAN:  Valerie A. Felicity Coyer, MD   REASON FOR CONSULTATION:  Depression, rule out dementia.   HISTORY OF PRESENT ILLNESS:  Mrs. Mcglade is a 77 year old female  admitted to the [facility] on March 08, 2008 for rule out TIA.   Mrs. Pavelko has been having delusions and believing that her deceased  parents are alive.  She also has been having difficulty with the number  of medications that she is on.  She has been experiencing approximately  3 weeks of increased anhedonia, difficulty with concentration, depressed  mood and decreased energy.  She also has had some memory difficulty.   PAST PSYCHIATRIC HISTORY:  Mrs. Bureau does have a long-term history  taking Ambien for insomnia.  Review of the past medical record confirms  this.   FAMILY PSYCHIATRIC HISTORY:  None known.   SOCIAL HISTORY:  Mrs. Haring is married.  She lives with her husband.  She is retired from a Location manager.  She does not use any alcohol or  illegal drugs.  She has two children.   PAST MEDICAL HISTORY:  1. Fibromyalgia.  2. Chronic obstructive pulmonary disease.  3. Obesity.   MEDICATIONS:  The MAR is reviewed.  The patient is on Synthroid 70 mcg  daily.   ALLERGIES:  SULFA.   LABORATORY DATA:  Tylenol negative.  Urine drug screen positive for  benzodiazepines and opiates.  Sodium 142, BUN 14, creatinine 0.53, glucose 99, SGOT 25, SGPT 11.  WBC  5.1, hemoglobin 11, platelet count 171.   A head CT without contrast showed no acute abnormality.  There were  small vessel ischemic disease type changes, diffuse atrophy was noted.  An MRI of the  brain without contrast had no other findings.   REVIEW OF SYSTEMS:  CONSTITUTIONAL, HEAD, EYES, EARS, NOSE, THROAT,  MOUTH, NEUROLOGIC, PSYCHIATRIC CARDIOVASCULAR, RESPIRATORY,  GASTROINTESTINAL, GENITOURINARY, SKIN, MUSCULOSKELETAL, HEMATOLOGIC,  LYMPHATIC, ENDOCRINE, METABOLIC all unremarkable.   EXAMINATION:  VITAL SIGNS:  Temperature 98.0, pulse 77, respiratory rate  22, blood pressure 142/82.  O2 saturation on 3 liters 94%.  GENERAL APPEARANCE:  Mrs. Thivierge is an elderly female lying in a supine  position in her hospital bed with no abnormal involuntary movements.   MENTAL STATUS EXAM:  Mrs. Deniston is alert.  Her attention span is  mildly decreased.  Her eye contact is good.  Concentration mildly decreased.  Affect is constricted.  Mood is  depressed.  Memory:  There is short-term difficulty also.  On  orientation testing, Mrs. Laperle does not know the year or the month.  She does not know the day of the month.  She does state the day of the  week and person correctly.  Fund  of knowledge and intelligence are below that of her estimated  premorbid baseline.  Speech involves normal rate with slightly flat prosody.  There is no  dysarthria.  Thought process involves coherent process.  There is no evidence of  looseness of associations.  Thought content:  No thoughts of harming  herself, no thoughts of harming others.  Insight is poor, judgment is  impaired.   ASSESSMENT:  Axis I:  293.83 Mood disorder not otherwise specified  (idiopathic and general medical factors).  Major depressive disorder.  Rule out dementia, not otherwise specified.  Axis II:  None.  Axis III:  See past medical history.  Axis IV:  General medical.  Axis V:  40.   RECOMMENDATIONS:  1. Would check a TSH, B12, folic acid and RPR.  2. Would start Celexa at 10 mg q.a.m. for anti-depression, would      proceed with Ambien 5 mg nightly p.r.n. insomnia and  Would increase Celexa 20 mg q.a.m. for  anti-depression in 4 days as  tolerated.  1. Outpatient psychiatric followup can be obtained at one of the      clinics attached to [facility], [facility] or [facility].  2. Mrs. Dombeck may be a candidate for Namenda and/or Aricept if all      of her organic workup is negative and would start Aricept at 5 mg      nightly.      Antonietta Breach, M.D.  Electronically Signed     JW/MEDQ  D:  03/09/2008  T:  03/09/2008  Job:  [id number]

## 2010-11-21 NOTE — Assessment & Plan Note (Signed)
[name]                             PULMONARY OFFICE NOTE   ALANTRA, POPOCA                      MRN:          [id number]  DATE:03/21/2007                            DOB:          July 02, 1931    Ms. [name] is a 77 year old white female seen today as a work in. She  has a history of asthmatic bronchitis, chronic obstructive airflow  disease, and chronic oxygen dependence.   She maintains:  1. Serevent 1 spray b.i.d.  2. Flovent 2 sprays b.i.d. 220 mcg strength.  3. Spiriva daily.  4. Flonase 2 sprays each nostril daily.   Other maintenance medicines are listed on the chart and are corrected as  reviewed.   Today, she is coughing up thick tan mucus. She has had increased dyspnea  for one week.   PHYSICAL EXAMINATION:  VITAL SIGNS:  Temperature 98, blood pressure  124/80, pulse 82, saturation 96% on three liters.  CHEST:  Inspiratory and expiratory wheeze with poor airflow.  CARDIAC:  Regular rate and rhythm without S3, normal S1 and S2.  ABDOMEN:  Soft and nontender.  EXTREMITIES:  No edema or clubbing.  SKIN:  Clear.  NEUROLOGIC:  Intact.  NECK:  No jugular vein distension or lymphadenopathy. Supple.  HEENT:  Oropharynx clear.   IMPRESSION:  Asthmatic bronchitic flare with acute bronchitis.   PLAN:  The patient is to receive Levaquin 750 mg daily for 7 days and  pulse prednisone 40 mg daily of a rapid taper. We will see the patient  back in follow up.     Charlcie Cradle Delford Field, MD, [facility]  Electronically Signed    PEW/MedQ  DD: 03/21/2007  DT: 03/22/2007  Job #: [id number]

## 2010-11-21 NOTE — Discharge Summary (Signed)
NAMECHRISANN, [name]               ACCOUNT NO.:  [id number]   MEDICAL RECORD NO.:  [id number]          PATIENT TYPE:  INP   LOCATION:  5002                         FACILITY:  [facility]   PHYSICIAN:  Eulas Post, MD    DATE OF BIRTH:  24-Apr-1931   DATE OF ADMISSION:  11/03/2007  DATE OF DISCHARGE:                               DISCHARGE SUMMARY   ADMISSION DIAGNOSES:  1. Left medial and lateral meniscus tears.  2. Oxygen dependent chronic obstructive pulmonary disease.  3. Obesity.  4. Hypertension.  5. Hypothyroidism.  6. Hypercholesterolemia.   DISCHARGE DIAGNOSES:  1. Left medial and lateral meniscus tears.  2. Oxygen dependent chronic obstructive pulmonary disease.  3. Obesity.  4. Hypertension.  5. Hypothyroidism.  6. Hypercholesterolemia.  7. Medial collateral ligament strain.   DISCHARGE INSTRUCTIONS:  She can be weightbearing as tolerated using the  hinged knee brace.  The knee brace can be set from 0 to 90 degrees of  motion.  She can be weightbearing as tolerated and use a walker.  She  can remove the dressings on Sunday and at that point place Band-Aids and  it is okay to shower.   HOSPITAL COURSE:  [name] is a 77 year old woman who was admitted  after a left knee arthroscopy.  She preoperatively had severe difficulty  getting out of bed and required multiple assistance in order to get to  the bathroom.  She missed preoperative evaluations due to her inability  to get to the hospital without an ambulance.  After surgery, she did  well and was admitted for therapy and pain control.  She was initially  on an IV PCA and she has had chronic pain problems in the past.  She is  on chronic narcotics.  Her pain was well-controlled on IV and  subsequently p.o. analgesics.  Her wounds were clean and dry throughout  her hospital stay.  She remained afebrile for 48 hours prior to  discharge.  She worked physical therapy and was making slow progress.  She is planned to  be transferred to the skilled nursing facility for  ongoing therapy.  She received sequential compression devices for DVT  prophylaxis while in the hospital.  There were no complications.  The  patient benefited maximally from her hospital stay.  She is going to  come back and see me in approximately 2 weeks.  They should call for an  appointment at the number [phone].     Eulas Post, MD  Electronically Signed    JPL/MEDQ  D:  11/06/2007  T:  11/06/2007  Job:  [id number]

## 2010-11-21 NOTE — H&P (Signed)
[name], [name]               ACCOUNT NO.:  [id number]   MEDICAL RECORD NO.:  [id number]          PATIENT TYPE:  INP   LOCATION:  3022                         FACILITY:  [facility]   PHYSICIAN:  Michiel Cowboy, MDDATE OF BIRTH:  10-Oct-1930   DATE OF ADMISSION:  03/08/2008  DATE OF DISCHARGE:                              HISTORY & PHYSICAL   PRIMARY CARE Nikka Hakimian:  Arta Silence, M.D.   CHIEF COMPLAINT:  Confusion and double vision.   HISTORY OF THE PRESENT ILLNESS:  The patient is a 77 year old female  with a history of fibromyalgia, obesity and severe COPD on chronic  oxygen at home who has been taking Percocet, Ambien and Vicodin for her  chronic pain.  Her family states she may have a little extra Percocet  over the past two days; however, even prior to this they noticed some  worsening confusion, talking to people who are no longer alive, walking  with gait disturbance and per the patient possible double vision today.  This has progressed over the past one week.  Otherwise she has had no  fever, no chills, no cough, no chest pain, no shortness of breath, and  no lower extremity edema.   REVIEW OF SYSTEMS:  The review of systems is otherwise unremarkable.   Of note, the patient does endorse falling down in the bathroom, but she  states she did not hit her head and just hit her back against the wall.   PAST MEDICAL AND SURGICAL HISTORY:  The past medical history includes:  1. Obesity.  2. Hypothyroidism.  3. COPD on 3 liters of oxygen at her baseline.  4. GERD.  5. Hypertension.  6. History of multiple orthopedic procedures.  7. Arthritis.  8. Hypercholesterolemia.   SOCIAL HISTORY:  The patient used to be a heavy smoker, but not  currently.  She does not drink alcohol or use drugs.  She lives at home  with her husband.  She has two daughters who live close by.   FAMILY HISTORY:  The family history is noncontributory.   ALLERGIES:  ALLERGY TO SULFA.   MEDICATIONS:  1. Norvasc 2.5 mg daily.  2. Simvastatin 80 mg daily.  3. Flonase 2 puffs to each nostril twice a day.  4. Levoxyl 88 mcg daily.  5. Ambien as needed.  6. Lasix 20 mg as needed.  7. Potassium 20 mEq by mouth daily.  8. Flovent 220 two puffs twice a day.  9. Spiriva 18 daily,  10.Serevent Diskus 1 puff twice a day.  11.Albuterol as needed.   PHYSICAL EXAMINATION:  VITAL SIGNS:  Temperature 99.0, blood pressure  145/76, pulse 73, respirations 20, and satting 94% on room air; started  on Spiriva as per her dosage.  GENERAL APPEARANCE:  The patient is an obese female lying down on the  stretcher.  HEENT:  Head is atraumatic.  NECK:  The neck is obese, but there is no JVD noted.  NEUROLOGIC:  The patient is moving all four extremities.  Cranial nerves  are intact.  LUNGS:  The lungs have occasional crackles, but overall she  has good  breath sounds.  ABDOMEN:  The abdomen is obese, but it is nontender and nondistended.  EXTREMITIES:  The lower extremities are obese with no edema noted.  HEART:  The heart shows regular rate and rhythm.  No murmurs, rubs or  gallops.   LABORATORY DATA:  White blood cell count 5.3 and hemoglobin 11.6.  Sodium 140, potassium 3.7, bicarb 32 and creatinine 0.83.  Cardiac  enzymes negative.  UA is unremarkable.  EKG shows normal sinus rhythm,  no evidence for acute ischemia or infarction.  CT scan of the head  showed chronic ischemic changes and possibly an old lacunar infarct, but  no acute abnormalities and no bleed.   ASSESSMENT AND PLAN:  1. Changes in mental status.  This could be secondary to a      cerebrovascular accident versus transient ischemic attack.  Also it      could be secondary to polypharmacy.  We will also assess for any      infection, although this is less likely as the patient has a normal      white blood cell count, no fevers, no chills, and no cough.  We      will nonetheless get a chest x-ray as the patient does  have a      history of pulmonary disease.  Another possibility could be      hypercarbia as the patient has a slightly elevated bicarbonate.  If      her symptoms persist we will obtain arterial blood gas.  We will      also admit to telemetry and obtain magnetic resonance imaging of      the brain, magnetic resonance angiography, carotid Dopplers, and      two-dimensional echocardiogram as the patient has had recent      symptoms such as double vision, although currently she is      asymptomatic.  2. Hypertension.  Continue Norvasc.  3. Chronic obstructive pulmonary disease.  Continue home medications.  4. Hyperlipidemia.  We will check a fasting lipid profile and continue      simvastatin.  5. Prophylaxes.  Protonix and Lovenox.   CODE STATUS:  The patient wishes to be a Full Code, but does not wish  to be maintained by artificial means for a prolonged period of time.   NOTE:  Dr. Felicity Coyer will assume the patient's care in the morning.      Michiel Cowboy, MD  Electronically Signed     AVD/MEDQ  D:  03/09/2008  T:  03/09/2008  Job:  [id number]   cc:   Arta Silence, MD

## 2010-11-21 NOTE — Consult Note (Signed)
NAMEJOURNII, NIERMAN               ACCOUNT NO.:  [id number]   MEDICAL RECORD NO.:  [id number]          PATIENT TYPE:  IPS   LOCATION:  4001                         FACILITY:  [facility]   PHYSICIAN:  Antonietta Breach, M.D.  DATE OF BIRTH:  1930/10/24   DATE OF CONSULTATION:  03/12/2008  DATE OF DISCHARGE:                                 CONSULTATION   [name] has had a resolution in psychotic symptoms.  She did eat 75%  of her lunch.  She has been cooperative with her rehabilitation care.  She does continue to demonstrate some forgetfulness by staff report.   Her interests and mood are within normal limits.  She has started  Aricept 5 mg daily at bedtime.   REVIEW OF SYSTEMS:  GASTROINTESTINAL:  No nausea with the Cymbalta.   WBC 5.6, hemoglobin 13.1, platelet count 213, sodium 142, BUN 8,  creatinine 0.7, glucose 129, SGOT 33, SGPT 12, TSH and folic acid within  normal limits.   Her psychotropic medication continues to be the Aricept 5 mg daily at  bedtime, Cymbalta 60 mg daily, Synthroid 100 mcg daily, Ambien 5 mg  daily at bedtime.   The undersigned did have a discussion with the patient's daughter  regarding her history of psychotropic medication.  It turned out that  [name] had only been on her Cymbalta for 2 weeks.  Therefore the  undersigned did not proceed with a Cymbalta taper and a Celexa titration  but left the Cymbalta at 60 mg daily for her initial trial.   PHYSICAL EXAMINATION:  VITAL SIGNS:  Temperature 97.3, pulse 77,  respiratory rate 16, blood pressure 126/76, O2 saturation on 3 liters  97%.  MENTAL STATUS EXAM:  [name] is alert.  Her eye contact is good.  Her concentration is within normal limits.  Affect is broad and  appropriate.  Mood is slightly anxious.  Memory 3/3 immediate, 2/3 at 3  minutes.  She is oriented to all spheres.  Her speech involves normal  rate and prosody without dysarthria.  Thought process is coherent.  Thought content - no  thoughts of harming herself, no thoughts of harming  others, no delusions, no hallucinations.  Her insight is intact  regarding her history of depression as well as a vague description by  her of some mental cloudiness.   ASSESSMENT:  293.83, mood disorder not otherwise specified (idiopathic  and general medical factors) now stable.  296.35, major depressive  disorder in remission except for residual decreased energy.  293.00,  delirium not otherwise specified now resolved.   RECOMMENDATIONS:  Concur with the Aricept trial 5 mg daily at bedtime.  If by the end of one month there is partial improvement would increase  the Aricept to 10 mg daily.  The patient already appears to be having  some emergence of improved memory, this may be due to her resolution of  delirium symptoms at this point.  Will continue Cymbalta 60 mg daily  antidepression trial.   PRELIMINARY DISCHARGE PLANNING:  Anticipate psychiatric followup at one  of the psychiatric clinics [facility] to [facility] [place]  [facility].  There are some private psychiatrists in the  community that would accept [name].      Antonietta Breach, M.D.  Electronically Signed     JW/MEDQ  D:  03/13/2008  T:  03/13/2008  Job:  [id number]

## 2010-11-21 NOTE — Discharge Summary (Signed)
[name], [name]               ACCOUNT NO.:  [id number]   MEDICAL RECORD NO.:  [id number]          PATIENT TYPE:  INP   LOCATION:  5002                         FACILITY:  [facility]   PHYSICIAN:  Eulas Post, MD    DATE OF BIRTH:  06/02/1931   DATE OF ADMISSION:  11/03/2007  DATE OF DISCHARGE:  11/07/2007                               DISCHARGE SUMMARY   ADDENDUM   DISCHARGE DISPOSITION:  Home.   ADDITIONAL DISCHARGE DIAGNOSIS:  Includes a urinary tract infection.   INTERVAL HOSPITAL COURSE:  Her interval changes from the previous  discharge summary, Ms. Sobczyk has continued to do reasonably well,  although she has had limited gains with physical therapy.  After long  discussions with social worker and the family and physical therapy, the  family strongly wishes for her to go home with home health PT.  We will  accommodate these wishes, and hopefully, she will be able to function  better than she did preoperatively.  Additionally, she had a urinalysis  done on the day prior to discharge, which demonstrated a significant  amount of bacteria per high-power field as well as a large amount of  nitrites and leukocytes.  Therefore, she is being discharged on Bactrim  DS 1 tablet p.o. b.i.d. for a period of 3 days.  We will plan to see her  back in the clinic in approximately 2 weeks.      Eulas Post, MD  Electronically Signed     JPL/MEDQ  D:  11/07/2007  T:  11/08/2007  Job:  [id number]

## 2010-11-21 NOTE — Consult Note (Signed)
NAMERUWAYDA, CURET               ACCOUNT NO.:  [id number]   MEDICAL RECORD NO.:  [id number]          PATIENT TYPE:  IPS   LOCATION:  4001                         FACILITY:  [facility]   PHYSICIAN:  Mark C. Vernie Ammons, M.D.  DATE OF BIRTH:  1930-10-14   DATE OF CONSULTATION:  DATE OF DISCHARGE:                                 CONSULTATION   The patient is seen in hospital consultation for further evaluation of  difficulty urinating, which had begun during this hospitalization  according to the patient.  She reports that in the past she has had some  difficulties with urination that had been treated with Pyridium  successfully.  She was having difficulty urinating and a bladder scan  was performed that revealed elevated postvoid residual.  She had then  urinated and a postvoid residual was found to be 75 mL.  A repeat PVR  was then checked with a bladder scan about 2 hours later and she was  found to have approximately 500 mL in her bladder.  There was a great  deal of question as to what was actually going on and a Foley catheter  was attempted.  Despite placing the Foley catheter, very minimal output  was noted.  I was contacted regarding her voiding difficulty and  difficulty with Foley placement.  The patient denies any other  significant past urologic history.  Her urinary retention would be  considered of moderate severity and of recent onset with no associated  flank pain, hematuria, or dysuria.   PAST MEDICAL HISTORY:  1. Fibromyalgia.  2. Obesity.  3. Severe COPD, on chronic oxygen at home.  4. Hypothyroidism.  5. Gastroesophageal reflux disease.  6. Hypertension.  7. History of multiple orthopedic procedures.  8. Arthritis.  9. Hypercholesterolemia.   MEDICATIONS ON ADMISSION:  Norvasc, simvastatin, Flonase, Levoxyl,  Ambien, Lasix, potassium, Flovent, Spiriva, Serevent, and albuterol.   ALLERGIES:  SULFA.   FAMILY HISTORY:  Noncontributory.   SOCIAL HISTORY:  She has  smoked heavily in the past, but currently does  not smoke.  She denies alcohol use.  She lives at home.   REVIEW OF SYSTEMS:  Positive for shortness of breath, cough, joint  swelling, lower back pain, anxiety, and depression.  Otherwise, her full  review of systems is negative.   PHYSICAL EXAMINATION:  VITAL SIGNS:  Temperature is 98.6, pulse 76,  respiration 14, and blood pressure 133/74.  GENERAL:  The patient is a well-developed, well-nourished obese white  female, in no apparent distress.  HEENT:  Atraumatic and normocephalic.  Oropharynx is clear.  NECK:  Supple with midline trachea.  CHEST:  Normal respiratory effort.  She does have chronic oxygen on and  using nasal cannula oxygen.  CARDIOVASCULAR:  Regular rate and rhythm.  ABDOMEN:  Obese, soft, and nontender without peritoneal signs or mass.  GU:  Normal external female genitalia with a Foley catheter indwelling.  She has normal anus and perineum.  EXTREMITIES:  Obese, but without clubbing, cyanosis, or edema.  SKIN:  Warm and dry.  NEUROLOGICAL:  She has no gross focal neurologic deficits.  Creatinine 0.7.  White count 5.6.  Urine culture done on March 09, 2008, revealed multiple bacteria consistent with contamination.   CT scan of the pelvis with contrast instilled through the Foley catheter  reveals the Foley catheter is in good position in the bladder with no  abnormalities of the bladder or urethra seen.  A very large uterus is  seen posterior to the bladder, which I think is being confused with  elevated postvoid residual using the bladder scan device.   IMPRESSION:  Possible urinary retention/elevated postvoiding residual  that appears to be possibly multifactorial.  I am suspicious that she  even has urinary retention at all since what I think is being seen on  her bladder scan is the enlarged uterus since the volumes recorded very  so significantly from scan to scan over short periods of time.  This  would  be nearly physiologically impossible to have that much urine  collecting the bladder over short period of time.  However, I need to  rule this out completely and since she was having difficulty from a  subjective standpoint with urination, I am going to have the Foley  catheter left in place upon discharge.  She will follow up with me as an  outpatient.   PLAN:  Leave Foley catheter in upon discharge.  She will follow up in my  office as an outpatient, so I can evaluate her voiding further and  perform urodynamics if necessary.      Mark C. Vernie Ammons, M.D.  Electronically Signed     MCO/MEDQ  D:  03/16/2008  T:  03/16/2008  Job:  [id number]

## 2010-11-21 NOTE — Consult Note (Signed)
[name], [name]               ACCOUNT NO.:  [id number]   MEDICAL RECORD NO.:  [id number]          PATIENT TYPE:  INP   LOCATION:  3022                         FACILITY:  [facility]   PHYSICIAN:  Deanna Artis. Hickling, M.D.DATE OF BIRTH:  [birth year]   DATE OF CONSULTATION:  03/10/2008  DATE OF DISCHARGE:                                 CONSULTATION   CHIEF COMPLAINT:  Slurred speech and gait disorder.   HISTORY OF PRESENT CONDITION:  [name] is a 77 year old married  woman with a longstanding history of fibromyalgia, morbid obesity,  severe chronic obstructive pulmonary disease on home oxygen.  The  patient was admitted to the hospital with a history of confusion and  double vision.  In addition, the patient has had increasing problems  with gait and had had increasing difficulty with visual hallucinations,  problems with her memory, and misperceptions of her environment.   The patient was admitted to the hospital for evaluation.  She had a CT  scan of the brain, which showed evidence of atrophy.  MRI scan of the  brain showed some remote lacunar infarctions, subcortical white matter  disease, and diffuse both subcortical and cortical atrophy.  There was  no evidence of acute infarction.   I have asked to see the patient to determine the etiology of her  dysfunction to make recommendations for further workup and treatment.   It is important to note, that the patient has had a large number of  medications that she takes some of which have been added to over the  last few days, which included Celebrex and Cymbalta.  Added to medicines  like Lyrica, presumably for her pain.  She has also been taking sizable  doses of Percocet.  In the hospital though there were plans made to  simplify her drug regimen, Celexa was added by Dr. Jeanie Sewer.  He  continued Ambien so that she could continue to sleep at night and  Aricept was added because of diagnosis of dementia.   Her past medical  history is remarkable for:  1. Hypothyroidism.  2. Chronic obstructive pulmonary disease requiring 3 L of oxygen to      keep her at baseline.  3. Gastroesophageal reflux disease.  4. Hypertension.  5. Arthritis.  6. Hypercholesterolemia.  7. In addition, she has osteoarthritis and has had multiple orthopedic      procedures in the past.   In addition, the patient was seen by Dr. Jeanie Sewer for depression.  He  noted that she had been having delusions and believing that her deceased  parents were alive.  He believed that she had 3 weeks of increased  anhedonia, difficulty with concentration, depressed mood, and decreased  energy and also problems with memory.  He noted that she had a long-term  history of taking Ambien for insomnia.   On his examination, he noted the patient had depressed affect, problems  with short-term memory, fund of knowledge, and intelligence is below her  premorbid baseline.  Speech with flat prosody without dysarthria,  coherent thoughts without loosen of associations.  He felt that she had  a mood disorder, not otherwise specified possible major of depressive  disorder, rule out dementia.  He recommended treating her with Celexa,  laboratory studies to look for treatable causes of dementia, and  continuing Ambien.  He told that Celexa was increased that Ambien might  be discontinued.  He also suggested that she might be a candidate for  Aricept and that was started.   Family history is positive for mother who died in her 31s of congestive  heart failure, father who died of myocardial infarction in his 75s, 2  siblings have lung cancer.  No one with dementia.   SOCIAL HISTORY:  The patient is married.  She is a very forceful  personality and her husband tends to be rather passive.  He notes that  over the past couple of months that she has been unable to keep her  check book where as previously it was balanced and correctly done month  in and month out.   She has noted used tobacco or alcohol.  She has 2  daughters who live close by and are at bedside at this time.  She is not  driven in years.  She has not found that she has lost her way either at  home or in the neighborhood.   CURRENT MEDICATIONS:  1. Lyrica 75 mg daily.  2. Simvastatin 80 mg at bedtime.  3. Norvasc 5 mg daily (up from 2.5).  4. Spiriva 18 mcg 1 puff daily.  5. Flovent 220 mcg 1 puff twice daily.  6. Levothyroxine 88 mcg daily.  7. Serevent 50 mcg 1 puff twice daily.  8. Protonix 40 mg daily.  9. Docusate 100 mg twice daily.  10.Lovenox 40 mg subcutaneously daily.  11.Celexa 10 mg daily.  12.Oxycodone 5 mg every 6 hours as needed.  13.Xanax 0.25 mg every 6 hours as needed.  14.Ambien 5 mg at bedtime as needed.   DRUG ALLERGIES:  SULFA.   PHYSICAL EXAMINATION:  GENERAL:  Today, this is a pleasant woman who  does not appear to be in significant pain.  She is sitting in bed and at  times was jocular.  She did not appear depressed.  VITAL SIGNS:  Blood pressure 149/77, resting pulse 76, respirations 18,  temperature 98.4, and oxygen saturation 93%.  EARS, NOSE, AND THROAT:  No bruits.  No infections.  LUNGS:  Clear.  HEART:  No murmurs.  Pulses normal.  ABDOMEN:  Protuberant.  Bowel sounds normal.  No hepatosplenomegaly.  EXTREMITIES:  Normal.  I did not see significant arthritic changes in  her joints.   NEUROLOGIC EXAMINATION:  Mini-Mental Status was 28.  She was not able to  tell me the day of the month nor the floor that she is on.  She was  unable to calculate subtracting serial sevens but was able to correctly  spell the word world backwards.  Her clock drawing was completely  inverted, and she did not properly space the numbers nor was she able to  put down the correct time even allowing for the backwards clock.  This  gave her 2/5 for clock drawing.  She was able to name seven animals in 1  minute where 17 would be normal.  Cranial Nerves:  Round  reactive  pupils.  Visual fields full to double simultaneous stimuli.  Symmetric  facial strength.  Midline tongue.  Air conduction greater than bone  conduction bilaterally.   Motor Examination:  The patient had normal strength except for her  right  deltoid, which had give away secondary to rotator cuff injury.  Fine  motor movements were normal.  Sensation:  No significant neuropathy.  She had good stereognosis.  Cerebellar Examination:  Good finger-to-  nose.  I did not test heel-knee-shin.  Her rapid repetitive movements  were somewhat slow.  Gait was broad-based and apraxic as well as  antalgic.  Deep tendon reflexes were absent.  The patient had bilateral  flexor plantar responses.  There were no frontal lobe release signs.   IMPRESSION:  1. Dementia.  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  2. Fibromyalgia.  3. Polypharmacy.   PLAN:  1. I agree with the use of Aricept.  She has been started properly at      5 mg at nighttime and should increase to 10 mg in 2 weeks' time.  2. We need to simplify her drug regimen.  I would stop either Lyrica      or Cymbalta.  If Celexa works to help her sleep, it can be      increased to help her depression, although she does not seem      severely depressed at this time.  Ambien should be stopped at some      point.  Her narcotic analgesic needs to be carefully titrated.  I      am afraid that she is in pain and that she is forgetting that she      has taken pain medicine and so takes more.  Her daughters are need      to be responsible for administering her medications.  The family      needs to purchase a book called the 36-hour Day.  This is a      discussion of Alzheimer disease in the stages, which will be very      helpful to them in the next months and years.  She should follow up      with me at [facility] in 3 months' time.  I do      not believe that further workup is necessary.   An EEG would be of      interest, but it will not change my impression.  It would be very      important for her to be involved in a weight loss program, which      would help greatly to decrease the load on her back and her joints.      This may be very difficult.  Inpatient versus outpatient      rehabilitation for gait apraxia would be helpful.  I think that she      has got enough memory to benefit from that.   Obviously, all this cannot be done at once.  A coordinated program needs  be treated to simplify her drug regimen.  I believe that if that is done  that we may see improvement in her Mini-Mental Status.   I appreciate the opportunity to participate in her care.  If you have  questions about this that I could be of assistance, do not hesitate to  contact me.  I have discussed this down with her family at length for  about one-half hour touching on all of these points but also with Dr.  Felicity Coyer.      Deanna Artis. Sharene Skeans, M.D.  Electronically Signed     WHH/MEDQ  D:  03/10/2008  T:  03/11/2008  Job:  [id number]   cc:   Arta Silence, MD  [place], [place] A. Felicity Coyer, MD

## 2010-11-21 NOTE — Assessment & Plan Note (Signed)
[facility]                             PULMONARY OFFICE NOTE   Alice, [name]                      MRN:          [id number]  DATE:05/01/2007                            DOB:          12-25-1930    [name] comes in today to be interviewed for qualifications  regarding a motorized wheelchair.  All questions were answered.  The  form was completed.  She does qualify for the Scooter and we will apply  the prescription accordingly.     Charlcie Cradle Delford Field, MD, [employer]  Electronically Signed    PEW/MedQ  DD: 05/01/2007  DT: 05/02/2007  Job #: [id number]   cc:   Arta Silence, MD

## 2010-11-21 NOTE — Discharge Summary (Signed)
[name], [name]               ACCOUNT NO.:  [id number]   MEDICAL RECORD NO.:  [id number]         PATIENT TYPE:  CINP   LOCATION:                               FACILITY:  [facility]   PHYSICIAN:  Valerie A. Felicity Coyer, MDDATE OF BIRTH:  1930/09/05   DATE OF ADMISSION:  03/09/2008  DATE OF DISCHARGE:  03/11/2008                               DISCHARGE SUMMARY   DISCHARGE DIAGNOSES:  1. Altered mental status in the setting of dementia and polypharmacy      status post Neurology evaluation, March 10, 2008.  2. Hypertension.  3. Anxiety/depression.  4. History of fibromyalgia.  5. Chronic pain.  6. Hypothyroid.  7. Chronic obstructive pulmonary disease.   HISTORY OF PRESENT ILLNESS:  [name] is a 77 year old female who was  admitted on March 09, 2008, due to altered mental status.  The  patient's family states that she has been having issues with memory over  the last several months, but they noted no acute change prior to  admission.  She was admitted for further evaluation and treatment.   HOSPITAL COURSE:  1. Altered mental status.  The patient was admitted and underwent      basic stroke workup which included an MRI and MRA, which showed no      acute infarct, but did note some atrophy and opacification of      mastoid air cells.  MRA noted mild-to-moderate intracranial      atherosclerotic-type changes.  Chest x-ray showed no acute disease.      CT of the head was also negative for acute changes.  A 2-D echo      noted left ventricular ejection fraction of 60% without obvious      cardiac source of embolus.  It was felt that the patient's mental      status issues were likely related to her polypharmacy.  She was      noted to have prescriptions for Percocet as well as Vicodin at      home.  She was also noted to have prescription for Lyrica, Xanax,      and Ambien.  She was getting these medications from multiple      providers, and she is cared for by her husband.  It  was unclear      whether or not the patient was taking these medications in the      manner in which they were ordered, and it is also unclear if all of      her providers knew about the other medications which were being      prescribed.  We attempted to simplify her medication regimen by      changing her Xanax to a smaller dose at a p.r.n. dosing schedule.      Her Ambien was initially 10 mg p.o. daily.  This was decreased to 5      mg nightly p.r.n. and hopes for eventually being able to      discontinue Ambien completely.  She was seen in consultation by Dr.      Antonietta Breach who started  Celexa; however, this was discontinued      last night.  It is unclear why this medication was discontinued      based on the chart note.  She was also seen in consultation by Dr.      Ellison Carwin of Neurology who agreed with plan as ordered.      There were no reversible causes of her altered mental status, which      were identifiable except for a mildly positive urinalysis.      Cultures were also unavailable at the time of this dictation.  She      was seen in consultation by physical therapy and occupational      therapy who both felt that she could benefit from [facility] stay prior to discharge to home.   PHYSICAL EXAMINATION:  VITAL SIGNS:  BP 152/86, heart rate 100,  respiratory rate 20, temperature 98.2, O2 sat 95% on 3 L.  GENERAL:  The patient is an elderly white female.  Awake, alert, in no  acute distress.  CARDIOVASCULAR:  S1 and S2, regular rate and rhythm is noted.  LUNGS:  Breath sounds are clear to auscultation bilaterally without  wheezes, rales, or rhonchi.  ABDOMEN:  Soft, nontender, and nondistended.  Positive bowel sounds were  noted.  PSYCHIATRIC:  She is oriented to person and place.  She is not oriented  to the year; otherwise, calm and pleasant.   PERTINENT DISCHARGE LABORATORY:  Folate 336, BUN 14, creatinine 0.53,  sodium 142, potassium  3.5, hemoglobin 11, hematocrit 33.6, TSH 4.007,  hemoglobin A1c 6.0, RPR nonreactive, B12 401.   DISPOSITION:  She will be discharged to [facility].   FOLLOWUP:  Upon discharge as needed.  She will need to follow up with  her primary care Merric Yost at [facility].  Her primary care Adenike Shidler is Dr.  Hetty Ely.   DISCHARGE MEDICATIONS:  1. Zocor 80 mg daily.  2. Spiriva 18 mcg inhaler 1 puff daily.  3. Flovent 220 mcg twice daily.  4. Serevent 1 puff twice daily.  5. Protonix 40 mg p.o. daily.  6. Colace 100 mg p.o. b.i.d.  7. Lovenox 40 mg subcu daily.  8. Lasix 20 mg p.o. daily.  9. K-Dur 10 mEq p.o. daily.  10.Levothyroxine 100 mcg p.o. daily.  11.Flonase 2 sprays each nostril once daily.  12.Pepcid 40 mg p.o. b.i.d.  13.Celebrex 200 mg p.o. daily.  14.Cymbalta 60 mg p.o. daily.  15.Lyrica 50 mg p.o. b.i.d., which has today been decreased to 25 mg      p.o. b.i.d.  16.Aricept 5 mg p.o. at bedtime with plans to increase this to 10 mg      p.o. daily in 2 weeks per Neuro recommendations.  17.Norvasc 5 mg p.o. daily.  18.Oxycodone 5 mg p.o. q.6 h. p.r.n.  19.Albuterol 1 puff q.4 h. p.r.n.  20.Xanax 0.25 mg p.o. q.6 h. p.r.n.  21.Ambien 5 mg p.o. at bedtime p.r.n.   Greater than 30 minutes were spent on discharge planning.      Sandford Craze, NP      Raenette Rover. Felicity Coyer, MD  Electronically Signed    MO/MEDQ  D:  03/11/2008  T:  03/12/2008  Job:  [id number]   cc:   Arta Silence, MD

## 2010-11-21 NOTE — Assessment & Plan Note (Signed)
[name]                             PULMONARY OFFICE NOTE   LAYNA, ROEPER                      MRN:          [id number]  DATE:05/07/2007                            DOB:          Jun 14, 1931    ALLERGY FOLLOWUP   PROBLEM:  1. Atopic bronchitis/COPD.  2. Esophageal reflux.  3. Hypoxic respiratory failure.   HISTORY:  She is doing well with her allergy vaccine.  No problems at  1:10 giving her own.  Saw Dr. Hetty Ely recently for pulled muscle left  flank.  Gets hoarse occasionally.  Does not notice a big day to day  change in her breathing now, but says some days are fair.  She is  getting a powered scooter.  Has been able to be off of prednisone, but  continues oxygen at 3L.  She insists the allergy vaccine does seem to  help her, recognizing that baseline is poor regardless.   MEDICATIONS:  1. Flonase 2 puffs each nostril b.i.d.  2. Levoxyl 0.88.  3. Ambien p.r.n.  4. Norvasc 2.5 mg.  5. Lasix 20 mg.  6. Allergy vaccine.  7. Potassium 10 mEq.  8. Flovent 220 two puffs b.i.d.  9. Serevent Diskus 1 puff b.i.d.  10.Spiriva.  11.Albuterol rescue inhaler.  12.She has an EpiPen.   DRUG INTOLERANCES:  SULFA.   OBJECTIVE:  Weight 258 pounds with oxygen tank.  BP 116/76, pulse 93.  Oxygen saturation was 92% on 3L when she first came in and 96% on 3L  after sitting.  Obese, talkative, mildly hoarse.  Pharynx is clear.  No evidence of post-nasal drainage or significant  turbinate edema.  Breath sounds are diminished, but without wheeze, rales, or rhonchi.  No  edema.  HEART:  Sounds are regular without murmur.   IMPRESSION:  There is an atopic component to her bronchitis.  She has  been more stable, reflecting her significant underlying chronic  obstructive pulmonary disease component.  Note that she had an IgE  level, which was low last year, and was recognized as not a Xolair  candidate at that time.  Most likely her allergy  component is rather  distinctly seasonal.  We will watch to see for how long allergy vaccine  seems to provide any improved quality of life.  Note that she has had  flu vaccine.   PLAN:  Return in 1 year for allergy followup, earlier p.r.n.  She will  be followed by pulmonary by Dr. Delford Field.     Clinton D. Maple Hudson, MD, [facility] Bollman, FACP  Electronically Signed    CDY/MedQ  DD: 05/09/2007  DT: 05/09/2007  Job #: [id number]   cc:   Arta Silence, MD

## 2010-11-24 NOTE — Op Note (Signed)
[facility]  Patient:    PHYNIX, HORTON                        MRN: [id number] Proc. Date: 10/16/99 Adm. Date:  [id number] Disc. Date: [id number] Attending:  Mick Sell                           Operative Report  PREOPERATIVE DIAGNOSIS:  Cataract - OD.  POSTOPERATIVE DIAGNOSIS:  Cataract - OD.  PROCEDURE:  Cataract extraction with intraocular lens implant - OD.  INDICATIONS:  The patient is a 77 year old female with painless, progressive decrease in vision so that she has difficulty seeing for reading and driving. n examination, she was found to have a dense nuclear sclerotic cataract consistent with decrease in visual acuity.  SURGEON:  Chucky May, M.D.  PROCEDURE:  The patient was brought to the main operating room and placed in supine position.  Anesthesia was obtained by means of topical 4% lidocaine drops with tetracaine.  She was then prepped and draped in usual manner.  A lid speculum was inserted and the cornea was entered with a diamond keratome superiorly with additional port superotemporally.  Occucoat was instilled and an anterior capsulorrhexis was performed without difficulty.  The nucleus was mobilized by hydrodissection, phacoemulsified and residual cortical material was removed by irrigation and aspiration.  The posterior capsule was polished and a posterior chamber lens implant was placed in the bag without difficulty.  Occucoat was removed and replaced with balanced salt solution.  The wound was hydrated with balanced salt solution and checked for fluid leaks and none were noted.  The eye was dressed with topical Pred Forte, Ocuflox, Voltaren and a Fox shield and the  patient was taken to recovery room in excellent condition, where she received written and verbal instructions for her postoperative care and was scheduled for a follow-up in 24 hours. DD:  11/01/99 TD:  11/01/99 Job: [id number]

## 2010-11-24 NOTE — Op Note (Signed)
NAMEROGER, FASNACHT               ACCOUNT NO.:  [id number]   MEDICAL RECORD NO.:  [id number]          PATIENT TYPE:  INP   LOCATION:  3310                         FACILITY:  [facility]   PHYSICIAN:  Mila Homer. Sherlean Foot, M.D. DATE OF BIRTH:  05/22/1931   DATE OF PROCEDURE:  09/14/2005  DATE OF DISCHARGE:                                 OPERATIVE REPORT   SURGEON:  Mila Homer. Sherlean Foot, M.D.   ASSISTANT:  Richardean Canal, P.A.   ANESTHESIA:  General.   PREOPERATIVE DIAGNOSIS:  Right shoulder avascular necrosis with rotator cuff  tear.   POSTOPERATIVE DIAGNOSIS:  Right shoulder avascular necrosis with rotator  cuff tear.   PROCEDURE:  Right shoulder hemiarthroplasty with rotator cuff repair and  subacromial decompression.   INDICATION FOR PROCEDURE:  The patient is a 77 year old white female with  significant pain and difficulty with weakness MRI evidence of AVN and  failure of conservative measures.  Informed consent was obtained.   DESCRIPTION OF PROCEDURE:  The patient was taken to the operating room and  administered general anesthesia, placed in the beach chair position and  right shoulder was prepped and draped in the usual sterile fashion.  Incision was made from the coracoid process and went through the skin and  then used cautery to obtain hemostasis.  I then found the deltopectoral  interval and placed the Corbel retractor in place, capturing the deltoid  laterally and the coracobrachialis and strap muscles medially.  I then  incised the capsule and subscapularis tendon up to the rotator cuff  interval.  I identified the tear.  The rotator cuff tissue was not very  good; there was an extreme amount of bursitis.  I removed all the bursa that  I could get to.  I then tagged the rotator cuff and at this point, I then  externally rotated and delivered the head out of the joint.  I then used the  Zimmer System and reamed up to 12 mm and then made the sagittal cut after  putting the  cutting guide in place.  This was in 30 degrees of retroversion.  I then cleaned out the joint of the synovitis.  The biceps tendon had been  torn and was in the way; I amputated that and could not find anything more  distally to tenodese.  I then placed the trial prosthesis down and with  multiple heads, I used the 21-mm thick x 46-mm diameter.  I then assessed  for good height and offset and rotation.  I then irrigated out and curetted  out the dead bone and bone-grafted.  I then placed the prosthesis down and  then repaired the rotator cuff with 6 Mitek anchors and 1 Bio-corkscrew  anchor, repairing a longitudinal tear as well as the subscapularis and  infraspinatus avulsion tear.  I then performed an acromioplasty to make sure  there was no rubbing on the rotator cuff; this was done with a straight  osteotome.  I then irrigated and closed with interrupted 0's, running 2-0's  and skin staples.  I infiltrated with Marcaine in the wound, dressed with  Xeroform dressing sponges, ABDs, 2-inch silk tape and also sling and swathe.   COMPLICATIONS:  None.   DRAINS:  None.   ESTIMATED BLOOD LOSS:  300 mL.           ______________________________  Mila Homer. Sherlean Foot, M.D.     SDL/MEDQ  D:  09/14/2005  T:  09/15/2005  Job:  [id number]

## 2010-11-24 NOTE — Assessment & Plan Note (Signed)
[name]                               PULMONARY OFFICE NOTE   LILLEY, HUBBLE                      MRN:          [id number]  DATE:05/27/2006                            DOB:          1930/10/06    [name] is a 77 year old, white female with a history of chronic  obstructive lung disease, pulmonary emphysematous component.  We had  switched her to the Advair HFA device in September.  Unfortunately, she  developed upper airway complaints with a dry, hacky cough, soreness in the  throat, and wheezing at night which is more pseudowheeze in nature.  Dr.  Sherene Sires switched her to Symbicort in October but she did not tolerate this as  well and she is now back on her original Serevent Diskus 1 spray b.i.d. and  Flovent HFA 2 sprays b.i.d., 220 mcg strength.  She is now using a spacer  device with the Flovent.  This seems to be helping.  She is on the Singulair  10 mg daily and Flonase 2 sprays each nostril daily.  Other maintenance  medicines are listed in the chart and correct as reviewed.  She was also  attempted to try Protonix 40 mg a day but had adverse reactions to this  because of headache.  She switched back to her Zantac at 300 mg b.i.d.  dosing.   EXAM:  Temp 98.  Blood pressure 136/80.  Pulse 76.  Saturation 96% on 3 L.  CHEST:  Distant breath sounds with prolonged expiratory phase.  No wheezing  or rhonchi noted.  CARDIAC:  Regular rate and rhythm without S3.  Normal S1, S2.  ABDOMEN:  Soft, nontender.  EXTREMITIES:  No edema or clubbing.  SKIN:  Clear.  NEUROLOGIC:  Intact.  HEENT:  Oropharynx clear.  NECK:  No jugular venous distention, lymphadenopathy.  Supple.   IMPRESSION:  Chronic obstructive lung disease with pulmonary emphysematous  component.   PLAN:  To maintain HFA Flovent 220 mcg strength 2 sprays b.i.d., Serevent  Diskus 1 spray b.i.d., Flonase daily, and discontinue further Advair.  We  will see the patient back in  return followup in 1 month.     Charlcie Cradle Delford Field, MD, [facility]  Electronically Signed    PEW/MedQ  DD: 05/27/2006  DT: 05/28/2006  Job #: [id number]

## 2010-11-24 NOTE — Assessment & Plan Note (Signed)
[facility]                               PULMONARY OFFICE NOTE   [name], [name]                      MRN:          [id number]  DATE:04/25/2006                            DOB:          24-Feb-1931    This a pulmonary/acute office evaluation.   HISTORY:  [age]-year-old white female who called the office today  requesting another round of prednisone.  She has apparently had one within  the last month, and improves significant from prednisone and then relapses  off of it.  She was recently switched from combination therapy with Serevent  and Flovent over to Advair HFA 230/21 two puffs b.i.d.  Her chief complaint  is one of a hacking, dry cough, with subjective wheezing at night, which is  keeping her up at night.  She also gets short of breath with anything more  than slow ADLs despite wearing oxygen at 3 liters per minute 24 hours a day.   She denies any purulent sputum production, fevers, chills, sweats or  orthopnea, PND, leg swelling or chest pain.   MEDICATIONS:  For a complete inventory of her medications, please see the  face sheet column dated April 25, 2006, noting that she says she keeps  all of her medicines in a box at home, but does not provide an inventory  list.   PHYSICAL EXAMINATION:  She is a slightly cushingoid-appearing, ambulatory,  obese white female in no acute distress.  She is afebrile, stable vital signs.  HEENT:  Unremarkable, oropharynx is clear.  LUNGS:  Lung fields reveal more prominent pseudo wheeze today but no true  wheezing.  There is a regular rhythm without murmur, gallop or rub.  ABDOMEN:  Soft, benign.  EXTREMITIES:  Warm without calf tenderness, cyanosis, clubbing or edema.   Metered-dose inhaler technique was reviewed, and to my surprise was only  about 1% effective at baseline, and 25% after teaching.   IMPRESSION:  I suspect the reason she is having so much coughing and pseudo  wheeze  from Advair is she getting more of it in the upper airway than  delivered to the lower airway.  Although I think it is fine to give her a 6-  day course of prednisone, and did so, I spent most of the office visit  today, 15 to 20 minutes of a 25-minute visit, going over the following  strategy:  1. Try switching empirically from Advair to Symbicort 160/4.5 two puffs      b.i.d.  2. Add Protonix 40 mg q.a.m. thirty minutes before breakfast, and continue      Zantac, but only take 300 mg at bedtime for now.  3. I reviewed with her also a gastroesophageal reflux disease diet and      pursed-lip maneuvers to try to help her with the pseudo wheeze.  If      this fails to control her symptoms, the next step would be to either      add Reglan or consider a sinus CT scan.   This patient seems somewhat disorganized with her medicines, despite her  insistence that she knows what she takes, and therefore I have offered the  services of our nurse practitioner for a medication reconciliation.  She  will return for this purpose within the next 2 weeks, and at that point need  to decide whether Symbicort is worth continuing at this dose.            ______________________________  Charlaine Dalton. Sherene Sires, MD, [facility]      MBW/MedQ  DD:  04/25/2006  DT:  04/28/2006  Job #:  [id number]   cc:   Arta Silence, MD

## 2010-11-24 NOTE — Op Note (Signed)
[name], [name]                           ACCOUNT NO.:  [id number]   MEDICAL RECORD NO.:  [id number]                   PATIENT TYPE:  AMB   LOCATION:  ENDO                                 FACILITY:  [facility]   PHYSICIAN:  Anselmo Rod, M.D.               DATE OF BIRTH:  07/17/1930   DATE OF PROCEDURE:  11/02/2002  DATE OF DISCHARGE:                                 OPERATIVE REPORT   PROCEDURE PERFORMED:  Screening colonoscopy.   ENDOSCOPIST:  Anselmo Rod, M.D.   INSTRUMENT USED:  Olympus videocolonoscope changed to pediatric adjustable  scope.   INDICATION FOR THE PROCEDURE:  Family history of colon cancer in a sister,  rule out colonic polyps, masses. etc.   PROCEDURE PREPARATION:  Informed consent was procured from the patient.  The  patient had fasted for eight hours prior to the procedure and prepped with a  bottle of MiraLax and Gatorade the night prior to the procedure.   PREPROCEDURE PHYSICAL:  VITAL SIGNS:  Stable.  NECK:  Supple.  CHEST:  Clear to auscultation.  S1 and S2 regular.  ABDOMEN:  Soft with normal bowel sounds.   DESCRIPTION OF THE PROCEDURE:  The patient was placed in the left lateral  decubitus position and sedated with 100 mg of Demerol and 8 mg of Versed  intravenously.  Once the patient was adequately sedated and maintained on  low-flow oxygen and continuous cardiac monitoring, the Olympus  videocolonoscope was advanced from the rectum to the mid-transverse colon  with difficulty.  The adult scope had to withdrawn, and the pediatric  adjustable scope was used instead.  The patient was positioned was changed  from the lateral position to the right lateral position to reach the cecal  gate.  Abdominal pressure was applied.  The patient was somewhat combative  and pulled out her IV during the procedure.  The procedure was technically  difficult and prolonged, but no masses or polyps were seen.  There was  evidence of early diverticulosis in  the left colon.  Retroflexion in the  rectum revealed small internal hemorrhoids.   IMPRESSION:  1. Small nonbleeding internal hemorrhoids.  2. Few left-sided diverticula.  3. No masses or polyps seen.   RECOMMENDATIONS:  1. High fiber diet with good food intake has been discussed with the     patient.  2.     Repeat colorectal screening is recommended in the next five years unless the     patient develops any abnormal symptoms in the interim.  3. Outpatient followup on a p.r.n. basis.                                               Anselmo Rod, M.D.    JNM/MEDQ  D:  11/02/2002  T:  11/02/2002  Job:  [id number]   cc:   Laurita Quint, M.D.  [address] [place]  [place] [address]  Fax: [phone]

## 2010-11-24 NOTE — Discharge Summary (Signed)
NAMESERAH, NICOLETTI               ACCOUNT NO.:  [id number]   MEDICAL RECORD NO.:  [id number]          PATIENT TYPE:  INP   LOCATION:  5038                         FACILITY:  [facility]   PHYSICIAN:  Mila Homer. Sherlean Foot, M.D. DATE OF BIRTH:  05-28-1931   DATE OF ADMISSION:  09/14/2005  DATE OF DISCHARGE:  09/16/2005                                 DISCHARGE SUMMARY   ADMISSION DIAGNOSIS:  Avascular necrosis of the right humeral head.   DISCHARGE DIAGNOSES:  1.  Avascular necrosis of the right humeral head.  2.  Rotator cuff tear.  3.  Chronic obstructive asthma.  4.  Hypertension.  5.  Hypothyroidism.  6.  Esophageal reflux disease.   PROCEDURE:  Right hemiarthroplasty of the right shoulder with rotator cuff  repair and acromioplasty.   HISTORY:  This patient is a 77 year old female who in September, 2005 had a  proximal humerus fracture, treated conservatively.  In October, 2006, had an  MRI revealing an MRI-positive scan showing a rotator cuff tear.  She now has  pain which is now unbearable and limiting her activities of daily living.  She is now indicated for right hemiarthroplasty.   HOSPITAL COURSE:  A 77 year old female admitted on September 14, 2005 after  appropriate laboratory studies were obtained as well as 1 gm Ancef IV on  call to the operating room.  Was taken to the operating room, where she  underwent a rotator cuff repair of the right shoulder with a right shoulder  hemiarthroplasty.  She tolerated the procedure well.  She was placed in the  step-down unit because of her COPD, that of primary emphysema.  She was  placed into a sling postoperatively.  Foley was placed to straight-drain  gravity.  Consults for case management were obtained.  She was placed on  Serevent, Flovent, Spiriva, and albuterol.  A reduced-dose Dilaudid PCA pump  was used.  Medicine was consulted also for this patient.  She was ultimately  transferred to 5000 on March 10th.  She had an otherwise  unremarkable  hospital course and was discharged on the 11th to return back to the office  in followup.   X-rays revealed good position of the right proximal humerus prosthesis and  the glenoid.   Laboratory studies revealed hemoglobin 11.1, hematocrit 33.5, white count  7100, platelets 282,000.  Discharge hemoglobin 9, hematocrit 26.5%, white  count 6400, platelets 220,000.  Pro time was 13.1, INR 1, and PTT 36.  Preop  sodium 141, potassium 3.7, chloride 103, CO2 30, glucose 103, BUN 10,  creatinine 0.6, calcium 9.1, total protein 7.2, albumin 4.1, AST 39, ALT 18,  ALP 60, total bilirubin 0.6.  Discharge sodium 137, potassium 3.5, chloride  103, CO2 29, glucose 113, BUN 8, creatinine 0.5, calcium 7.9.  Urinalysis  revealed few bacteria, 0-2 reds, 0-2 white with rare epithelium.  Blood type  is O+.  Antibody screen negative.   DISCHARGE INSTRUCTIONS:  Prescription for Percocet 1 tab every 4-6 hours as  needed for pain.  She will keep the arm in a sling and call if there are any  problems.  Keep her incision clean and dry.  No lifting with the arm.  She  will resume her diet as preop.  She will follow back up in the office in 10-  14 days from surgery.   Discharged in improved condition.      Oris Drone Petrarca, P.A.-C.    ______________________________  Mila Homer. Sherlean Foot, M.D.    BDP/MEDQ  D:  10/19/2005  T:  10/19/2005  Job:  [id number]

## 2010-11-24 NOTE — H&P (Signed)
[name], [name]               ACCOUNT NO.:  [id number]   MEDICAL RECORD NO.:  [id number]          PATIENT TYPE:  EMS   LOCATION:  ED                           FACILITY:  [facility]   PHYSICIAN:  Thomos Lemons, D.O. [facility]   DATE OF BIRTH:  [birth year]   DATE OF ADMISSION:  11/22/2004  DATE OF DISCHARGE:                                HISTORY & PHYSICAL   PRIMARY CARE PHYSICIAN:  Laurita Quint, M.D.   CHIEF COMPLAINT:  Chest/back discomfort and cough.   HISTORY OF PRESENT ILLNESS:  Patient is a 77 year old white female with a  past medical history of COPD/chronic asthmatic bronchitis, hypothyroidism,  hypertension who presents to the ER with chest discomfort/upper back pain  that got worse this morning.  The patient states that one week ago, she was  seen by OB/GYN for left groin/pelvic pain, presumed UTI, started on  Macrodantin.  Since that time, the patient states that her pain improved  initially, but over the last 48 hours, experienced a severe pain in the left  pelvic area that radiated up her left flank.  In addition, the patient  states that she had pleuritic-type chest pain that worsened with deep  inspiration and pain that radiated to the right side of her chest.  The  patient also notes that she has had increased coughing over the past 24  hours that has been productive of dark-brown sputum.  From her medication  history, from her husband, the patient is finishing a course of Macrodantin  and Avelox, her last three days.   REVIEW OF SYSTEMS:  Denies any fevers, chills.  No nausea or vomiting.  No  constipation or diarrhea.  No other GI symptoms.  No dysuria or urinary  symptoms.  All other systems are negative.   PAST MEDICAL HISTORY:  1.  Hypertension.  2.  Hypothyroidism.  3.  COPD.  4.  Asthmatic bronchitis.  5.  Hyperlipidemia.  6.  History of back surgery in 1986.  7.  History of bilateral cataract surgery.   SOCIAL HISTORY:  Patient is married.  Lives with her  husband.  Has two grown  children.  Quit tobacco approximately 20 years ago.  Smoked for 20 years,  one pack per day.  No history of alcohol use.  Previous occupation was  working for a [employer].   CURRENT MEDICATIONS:  1.  Zantac 300 mg b.i.d.  2.  Flovent 4 puffs b.i.d.  3.  Serevent 50 mcg twice daily.  4.  Flonase 2 sprays each nostril once daily.  5.  Albuterol p.r.n.  6.  Vytorin 10/20 1 p.o. q.h.s.  7.  Ambien 10 mg q.h.s. p.r.n.  8.  Levoxyl 0.088 mg daily.  9.  Levsin 0.125 mg p.r.n.  10. Lasix 20 mg once daily.  11. Norvasc 2.5 mg once daily.  12. Toprol XL 25 mg once daily.  13. Singulair 10 mg once daily.  14. Vicodin 7.5/750 1-2 tablets p.r.n.  15. Nitrofurantoin 100 mg b.i.d.  16. Avelox 400 mg with 3 pills remaining.   ALLERGIES:  SULFA.   LABORATORY DATA:  CBC:  WBC slightly elevated at 10.2, H&H of 11.1 and 33.7,  platelets 305.  Complete metabolic profile:  Sodium 141, potassium 4.2,  chloride 106, CO2 30, BUN 12, creatinine 0.9, blood sugar 159, calcium 8.5.  LFTs are within normal limits.  A BNP was 59.3.  The first set of cardiac  enzymes were less than 0.05.   Chest x-ray showed cardiomegaly, probable mild interstitial edema with small  effusions.   A CAT scan of the chest, pelvis, and abdomen was done with PE protocol.  Radiologist notes that chest CT is nondiagnostic due to poor timing of  contrast.  In reference to the abdomen and pelvis, there was no real calculi  or hydronephrosis.  There is an enlarged uterus for which an ultrasound is  recommended.   EKG was performed in the ER, which showed a normal sinus rhythm at 88 beats  per minute.  The patient had some T wave inversions in the inferior leads,  and poor R wave progression, suggestive of anterior infarct, age-  indeterminate.   ASSESSMENT/PLAN:  1.  Chest/back pain in a 76 year old with a recent urinary tract infection      and chronic obstructive pulmonary disease.  2.   Chronic obstructive pulmonary disease/asthmatic bronchitis.  3.  Hypertension.  Stable.  4.  Hypothyroidism.  5.  Gastroesophageal reflux disease.  6.  Hyperlipidemia.   RECOMMENDATIONS:  Differential diagnoses include chronic obstructive  pulmonary disease exacerbation, pulmonary embolus versus myocardial  infarction.  With her history of chronic obstructive pulmonary disease, the  patient may have a higher likelihood of having a pulmonary embolism;  therefore, a V/Q scan will be ordered.  The patient does not have any  evidence of congestive heart failure, and with her recent history of  antibiotic use and negative CAT scan, I doubt pyelonephritis is a  consideration.  We will maintain the patient on Avelox with symptoms of  continued cough and check three sets of cardiac enzymes.   In reference to her chronic obstructive pulmonary disease exacerbation, we  will maintain her outpatient inhaler regimen as well as p.r.n. nebulizer  treatments.   Patient will be maintained on her hypertension regimen and Synthroid as an  outpatient.   For her reflux, I will start Protonix 40 mg b.i.d.  In addition, we will  order a transvaginal ultrasound to better evaluate the enlarged uterus, and  she will be maintained on Levsin p.r.n. just in case this has anything to do  with irritable bowel.      RY/MEDQ  D:  11/22/2004  T:  11/22/2004  Job:  [id number]   cc:   Laurita Quint, M.D.  [address] [place]  [place] [address]  Fax: [phone]

## 2010-11-24 NOTE — Discharge Summary (Signed)
[name], [name]               ACCOUNT NO.:  [id number]   MEDICAL RECORD NO.:  [id number]          PATIENT TYPE:  INP   LOCATION:  3027                         FACILITY:  [facility]   PHYSICIAN:  Valerie A. Felicity Coyer, MDDATE OF BIRTH:  [birth year]   DATE OF ADMISSION:  04/09/2008  DATE OF DISCHARGE:  04/10/2008                               DISCHARGE SUMMARY   DISCHARGE DIAGNOSES:  1. Altered mental status, felt secondary to urinary tract infection.  2. Anxiety/depression.  3. Fibromyalgia.  4. Dyslipidemia.  5. Gastroesophageal reflux disease.  6. Chronic obstructive pulmonary disease.  7. Hypothyroidism.  8. Hypertension.   HISTORY OF PRESENT ILLNESS:  [name] is a 77 year old female who was  admitted on April 09, 2008, with chief complaint of altered mental  status.  She presented to the emergency room following episode of  unsteady gait, somnolence, and slurred speech.  She was admitted earlier  in the month __________ this admission for similar symptoms __________  has been reported poor p.o. intake for 2 days, this was correlated  __________ Ambien.  The patient also noted difficulty urinating.  She  was admitted for further evaluation and treatment.   PAST MEDICAL HISTORY:  1. Hypertension.  2. Anxiety/depression.  3. Fibromyalgia.  4. Chronic back pain.  5. Hypothyroidism.  6. Chronic obstructive pulmonary disease.  7. Degenerative disk disease.  8. Gastroesophageal reflux disease.  9. Hyperlipidemia.  10.Urinary retention.   COURSE OF HOSPITALIZATION:  Altered mental status.  The patient was  admitted.  Urine was sent for culture.  She was placed on IV Rocephin.  Her mental status improved, and she was discharged to home on May 11, 2008.   MEDICATIONS AT THE TIME OF DISCHARGE:  1. Norvasc 2.5 mg p.o. daily.  2. Zocor 80 mg p.o. daily.  3. Synthroid 100 mcg p.o. daily.  4. Lasix 20 mg p.o. daily.  5. Zantac 300 mg p.o. b.i.d.  6. Serevent 2 puffs  p.o. b.i.d.  7. Ambien 5 mg p.o. nightly p.r.n.  8. Percocet 5/325 one tab p.o. q.4 h. p.r.n.  9. Celebrex 200 mg p.o. daily.  10.Cymbalta 60 mg p.o. b.i.d.  11.Potassium chloride daily.  12.Cipro 500 mg p.o. b.i.d.   PERTINENT LABORATORIES AT TIME OF DISCHARGE:  BUN 25, creatinine 1.1,  hemoglobin 12.4, and hematocrit 37.4.   DISPOSITION:  She was discharged to home.   FOLLOWUP:  She was instructed to follow up with Dr. Hetty Ely on April 19, 2008.   Greater than 30 minutes was spent on discharge planning.      Sandford Craze, NP      Raenette Rover. Felicity Coyer, MD  Electronically Signed    MO/MEDQ  D:  05/28/2008  T:  05/29/2008  Job:  [id number]   cc:   Arta Silence, MD

## 2010-11-24 NOTE — Assessment & Plan Note (Signed)
[facility]                             PULMONARY OFFICE NOTE   Alice, [name]                      MRN:          [id number]  DATE:08/20/2006                            DOB:          [birth year]    [name] is a 77 year old, white female with a history of chronic  obstructive lung disease and asthmatic bronchitis who now comes in today  with increased cough productive of thick, yellow mucous, increased  hoarseness, shortness of breath, postnasal drainage. She maintains  Flonase 2 sprays each nostril b.i.d., Serovent 1 spray b.i.d., Flovent 2  sprays b.i.d. 220 mcg strength, Singulair 10 mg daily. She had been off  Spiriva because of bladder retention for over a year. She wishes to try  this again.   PHYSICAL EXAMINATION:  VITAL SIGNS:  Temperature 98, blood pressure  120/80, pulse 82, saturation 95% on 3 liters.  CHEST:  Showed inspiratory and expiratory wheeze, poor air flow.  CARDIAC:  Showed a regular rate and rhythm without S3, normal S1 and S2.  ABDOMEN:  Soft, nontender.  EXTREMITIES:  Showed no edema or clubbing.  SKIN:  Clear.   IMPRESSION:  Asthmatic bronchitic flare.   PLAN:  Receive doxicycline 100 mg twice a day for 7 days, prednisone 40  mg a day tapered down by 10 mg over 3 days until off. The patient is to  begin Spiriva 1 capsule daily maintaining Flovent, Serovent and Flonase  as is. Will see the patient back in 1 month.     Charlcie Cradle Delford Field, MD, [facility]  Electronically Signed    PEW/MedQ  DD: 08/20/2006  DT: 08/20/2006  Job #: [id number]   cc:   Arta Silence, MD

## 2010-11-24 NOTE — Assessment & Plan Note (Signed)
[facility]                               PULMONARY OFFICE NOTE   [name], [name]                      MRN:          [id number]  DATE:04/11/2006                            DOB:          22-Jul-1930    [name] is a 77 year old white female with a history of asthmatic  bronchitis, chronic obstructive lung disease, reflux disease, hypertension,  who comes back today still having some fatigue, having minimal mucous  production, no wheezing, some shortness of breath but it is at baseline.  She is questioning whether she can migrate back over to Advair from the  Serevent and Flovent.   She is currently on a Serevent Discus one spray b.i.d. and Flovent 220 mcg  strength 2 sprays b.i.d., Singular 10 mg daily, Albuterol p.r.n.  Other  maintenance medicines are correct as reviewed, have not changed since the  last visit with the exception that she is on Levoxyl 100 mcg daily.   PHYSICAL EXAMINATION:  VITAL SIGNS:  Temperature 98, blood pressure 150/84, pulse 85, saturation  95% on 3 liters.  CHEST:  Diminished breath sounds, prolonged expiratory phase, no wheezes, no  rhonchi.  CARDIAC:  Exam showed a regular rate and rhythm without S3, normal S1 and  S2.  ABDOMEN:  Soft, nontender.  EXTREMITIES:  No edema or clubbing.  SKIN:  Clear.  NEUROLOGICAL:  Intact.  HEENT:  No jugular venous distention, no lymphadenopathy.   IMPRESSION:  Stable chronic obstructive lung disease.   PLAN:  Switch to Advair HFA, 230/21, 2 sprays b.i.d.  She will discontinue  Serevent and Flovent.  A flu vaccine was administered.  We will see the  patient back in follow up in three months.       Alice Cradle Delford Field, MD, [facility]      PEW/MedQ  DD:  04/11/2006  DT:  04/12/2006  Job #:  [id number]

## 2010-11-24 NOTE — H&P (Signed)
[name], JEZEWSKI       ACCOUNT NO.:  [id number]   MEDICAL RECORD NO.:  [id number]            PATIENT TYPE:   LOCATION:                                 FACILITY:   PHYSICIAN:  Oris Drone. Petrarca, P.A.-C.  DATE OF BIRTH:   DATE OF ADMISSION:  DATE OF DISCHARGE:                                HISTORY & PHYSICAL   CHIEF COMPLAINT:  Painful right shoulder.   HISTORY:  This patient states that she has had a proximal humerus fracture  in September, 2005 and was treated with conservative measures.  She is an  emphysemic patient with marked chronic obstructive pulmonary disease.  She  is on chronic O2.  In October, 2006, she had an MRI which revealed avascular  necrosis as well as a rotator cuff tear.  Her pain is now to the point which  is unbearable and limiting her activities of daily living.  She has now  indicated because of her failure for a left hemiarthroplasty of the shoulder  and with possible cuff repair.  We have obtained multiple clearances, and  though she is at great risk, they would like to proceed with surgical  intervention.   PAST MEDICAL HISTORY:  In general, health is por.   HOSPITALIZATIONS:  1.  In 1986, spinal fusion.  2.  In 1954, left breast cystectomy.   PAST SURGICAL HISTORY:  As above.   MEDICATIONS:  1.  Levoxyl 0.88 mg q.a.m.  2.  Norvasc 2.5 mg q.a.m.  3.  Lasix 20 mg q.a.m.  4.  Zantac 300 mg b.i.d.  5.  Serevent discus 50 mcg 2 puffs twice daily.  6.  Flovent HFA 220 mcg 4 puffs twice daily.  7.  Flonase 2 puffs each nostril q.h.s.  8.  Potassium chloride 10 mcg 1 daily q.a.m.  9.  Albuterol inhaler 2 puffs p.r.n.  10. Ambien 10 mg q.h.s.  11. Singulair 10 mg q.a.m.  12. Hydrocodone 10 mg q.4-6h. p.r.n. pain.   ALLERGIES:  SULFA.   REVIEW OF SYSTEMS:  A 14-point review of systems is positive for glasses,  hypertension, asthma, and shortness of breath with emphysema and COPD.  She  is also hypothyroid.  Has had a history of  palpitations in the past.   FAMILY HISTORY:  Positive for heart disease in the mother and the father,  who are both deceased.   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  She quit smoking cigarettes in the 1980s, and she was smoking one  pack a day for 20-25 years.  She is married with two children.  She denies  the use of alcohol.   PHYSICAL EXAMINATION:  GENERAL:  A 77 year old white female, well-developed  and well-nourished, moderately obese, alert and cooperative.  In mild  distress.  Tachypneic.  VITAL SIGNS:  Temperature 98.4, pulse 76, respirations 28, blood pressure  150/76.  HEENT:  Grossly within normal limits.  NECK:  Supple.  No carotid bruits.  No masses palpable.  CHEST:  Decreased expansion.  LUNGS:  Marked decreased breath sounds on the right, in comparison to the  left, with increased expiratory phases.  No wheezes or rhonchi were noted.  CARDIAC:  Regular rate and rhythm.  Normal S1 and S2.  No murmurs, rubs or  gallops appreciated.  Pulses 2+ bilaterally and symmetric.  No bruits were  noted.  ABDOMEN:  Obese, soft, nontender.  No masses palpable.  Normal bowel sounds  present.  GENITAL/RECTAL/BREASTS:  Not indicated for the procedure.  CNS:  Alert and oriented x3.  Cranial nerves II-XII grossly intact.  MUSCULOSKELETAL:  She has a marked decreased range of motion of the right  shoulder and can only get to about 20-30 degrees of abduction, and this is  mainly with gastrohepatic motion.  Marked pain with any motion of her  shoulder at this time.  She is neurovascularly intact distally.   CLINICAL IMPRESSION:  1.  Avascular necrosis of the right humeral head with rotator cuff tear.  2.  Severe chronic obstructive pulmonary disease and emphysema.  3.  History of hypertension.  4.  History of hypothyroidism.   RECOMMENDATIONS:  At this time, we know that she is at increased risk with  her pulmonary disease for surgical  intervention.  We have no other options  at this time other than to perform the hemiarthroplasty and possible rotator  cuff repair.  Procedure risks and benefits have been explained fully to the  patient.  She is understanding.  She will follow up in the hospital for her  surgical intervention in the near future.      Oris Drone Petrarca, P.A.-C.     BDP/MEDQ  D:  08/23/2005  T:  08/23/2005  Job:  [id number]

## 2010-11-24 NOTE — Op Note (Signed)
[facility]  Patient:    [name], [name]                        MRN: [id number] Adm. Date:  04/10/00 Attending:  Chucky May, M.D.                           Operative Report  PREOPERATIVE DIAGNOSIS:  Prolapsed intraocular lens with pupillary capture.  POSTOPERATIVE DIAGNOSIS:  Prolapsed intraocular lens with pupillary capture.  OPERATION:  Repair of prolapsed intraocular lens, OD.  SURGEON:  Chucky May, M.D.  INDICATION FOR PROCEDURE:  The patient is a 77 year old female status post cataract extraction with intraocular lens in the posterior chamber in the bag approximately one to two years previously, who came in to the office for her annual office exam to evaluate cloudiness of the posterior capsule.  She was dilated in the office and the following day reported to the office complaining of pain and irritation in the right eye.  On examination, she was noted to have pupillary capture with the optic anterior of the iris.  Multiple attempts at maximally dilating the pupil, external massage and laser lysis of formed synechiae were unsuccessful in repositioning the optic behind the iris.  It was therefore elected to perform a surgical procedure with repositioning of the iris.  DESCRIPTION OF PROCEDURE:  The patient was brought to the operating room and placed in a supine position.  Anesthesia was obtained by means of topical 4% Lidocaine drops with tetracaine.  She was then prepped and draped in the usual manner.  A lid speculum was inserted and the cornea was entered with a stab incision superiorly temporally.  OcuCoat was instilled and using an irrigating cannula the iris was swept anteriorly over the surface of the eye well and the eye well was repositioned posteriorly.  Miochol was instilled to constrict the pupil.  The eye was dressed with topical Pred-Forte, Ocuflox, Voltaren and then Pilocarpine and the patient was taken to the  recovery room in excellent condition where she received written and verbal instructions for postoperative care and was scheduled for follow up in 24 hours. DD:  08/12/00 TD:  08/12/00 Job: [id number]

## 2010-11-24 NOTE — Op Note (Signed)
[facility]  Patient:    [name], [name]                        MRN: [id number] Proc. Date: 11/20/99 Adm. Date:  [id number] Disc. Date: [id number] Attending:  Mick Sell                           Operative Report  PREOPERATIVE DIAGNOSIS:  Cataract - OS.  POSTOPERATIVE DIAGNOSIS:  Cataract - OS.  PROCEDURE:  Cataract extraction with intraocular lens implant - OS.  INDICATIONS:  The patient is a 77 year old female with painless, progressive decrease in vision so that she has difficulty seeing for reading.  On examination, she was found to have a dense nuclear sclerotic and cortical cataract consistent with decrease in visual acuity.  PROCEDURE:  The patient was brought to the main operating room and placed in the supine position.  Anesthesia was obtained by means of topical 4% lidocaine drops with tetracaine.  She was then prepped and draped in the usual manner. A lid speculum was inserted and the cornea was entered with a diamond keratome superiorly with an additional port superotemporally.  Occucoat was instilled and an anterior capsulorrhexis was performed without difficulty.  The nucleus was mobilized by hydrodissection, phacoemulsified and residual cortical material was removed by irrigation and aspiration.  The posterior capsule was polished and a posterior chamber lens implant was placed in the bag without difficulty.  Occucoat was removed and replaced with balanced salt solution. The wound was hydrated with balanced salt solution and checked for fluid leaks and none were noted.  The eye was dressed with topical Pred Forte, Ocuflox, Voltaren and a Fox shield and the patient was taken to recovery room in excellent condition, where she received written and verbal instructions for her postoperative care and was scheduled for follow-up in 24 hours. DD:  12/14/99 TD:  12/18/99 Job: [id number]

## 2010-11-24 NOTE — Assessment & Plan Note (Signed)
[facility]                               PULMONARY OFFICE NOTE   ANGELINA, VENARD                      MRN:          [id number]  DATE:04/02/2006                            DOB:          [birth year]    PULMONARY/ALLERGY FOLLOWUP   PULMONARY:  Dr. Delford Field.   ALLERGY:  Dr. Maple Hudson.   PRIMARY CARE:  Dr. Hetty Ely.   PROBLEM:  1. Allergic rhinitis.  2. Asthmatic bronchitis.  3. Esophageal reflux.   HISTORY:  She continues to work with Dr. Delford Field for her asthma with chronic  obstructive pulmonary disease and comes today for allergy vaccine followup.  She has had increased sinus congestion in the past week with postnasal  drainage turning yellow or tan, and she had some bitemporal headache without  fever or sore throat.  She went through successful shoulder surgery.  She  denies chest congestion, although she had a little wheezing off and on,  easily managed.  She continues her allergy vaccine at 1:10, giving her own  injections.  We have reviewed policy and concerns related to administration  outside of a medical office, anaphylaxis, epinephrine, and choices.  She  reviewed and chose to sign our waiver, wishing to continue giving her own.  She has had no problems at all with her shots.   MEDICATION:  1. Serevent Diskus b.i.d.  2. Flonase 2 puffs.  3. Levoxyl 0.88.  4. Ambien 5 mg.  5. Norvasc 2.5 mg  6. Lasix 20 mg.  7. Allergy vaccine.  8. Zantac 300 mg b.i.d.  9. Vytorin 10/20.  10.Singulair 10 mg.  11.Potassium 10 mEq.  12.Vicodin ES p.r.n.  13.Imitrex p.r.n.  14.Albuterol rescue inhaler.  15.EpiPen, never needed.   DRUG INTOLERANCE:  SULFA.   OBJECTIVE:  VITAL SIGNS:  Weight 223 pounds, BP 150/80, pulse regular and  97, oxygen saturation 92% on 3-liter prongs.  LUNGS:  She is obese with shallow breath sounds consistent with her body  habitus, but no rales, rhonchi, or wheeze.  HEART:  Sounds regular.  No murmur or gallop heard.   She does not have  cyanosis or edema.  HEENT:  Her nose is a little dried from the oxygen without significant  obstruction or postnasal drainage.   IMPRESSION:  Symptoms suggest recent sinusitis with the background of her  asthma/chronic obstructive pulmonary disease and allergic rhinitis.   PLAN:  1. EpiPen was refilled with discussion.  2. She specifically requested a Z-Pak which we discussed and gave her,      anticipating if it is insufficient that she will call back.  3. She will continue her vaccine.  4. Allergy vaccine followup 1 year, earlier p.r.n.       Clinton D. Maple Hudson, MD, [facility], FACP      CDY/MedQ  DD:  04/03/2006  DT:  04/05/2006  Job #:  [id number]   cc:   Arta Silence, MD

## 2010-11-24 NOTE — Discharge Summary (Signed)
[name], [name]               ACCOUNT NO.:  [id number]   MEDICAL RECORD NO.:  [id number]          PATIENT TYPE:  INP   LOCATION:  3702                         FACILITY:  [facility]   PHYSICIAN:  Thomos Lemons, D.O. [facility]   DATE OF BIRTH:  [birth year]   DATE OF ADMISSION:  11/22/2004  DATE OF DISCHARGE:  11/23/2004                                 DISCHARGE SUMMARY   DISCHARGE DIAGNOSIS:  1.  Chest pain, atypical.  2.  Bronchitis.  3.  Asthma/chronic obstructive pulmonary disease.   HISTORY OF PRESENT ILLNESS:  The patient is a 77 year old female with past  medical history of COPD and chronic asthmatic bronchitis presented to the ER  with complaint of chest pain and pain in the upper back that got worse the  day of admission.  In addition, the patient had recently been seen by GYN  and was treated for what was presumed to be UTI.  The patient was admitted  for further evaluation of chest pain.   PAST MEDICAL HISTORY:  1.  Chronic obstructive pulmonary disease.  2.  Asthma.  3.  Hypertension.  4.  Hyperthyroid.  5.  Dyslipidemia.   HOSPITAL COURSE:  The patient was admitted.  The patient underwent serial  cardiac enzymes which were all negative.  The patient was noted on VQ scan  to have low probability for PE.  The CT angio was performed prior to the VQ  scan which was nondiagnostic and indicated CHF with small bilateral  effusions.  In addition, CT angio noted enlarged uterus.  As a result, the  patient underwent pelvic and transvaginal ultrasound which showed an  abnormally large fibroid.  As the patient is post menopausal, this is  concerning and the patient was instructed to follow up with gynecologist for  biopsy.  The patient was placed on IV Avelox for presumed bronchitis.  In  addition, the patient was placed on nebulizer treatments.  The plan was to  discharge the patient to home and have the patient follow up with primary  care Khang Hannum and pulmonologist as needed.   DISCHARGE LABORATORY DATA:  Hemoglobin 10.4, hematocrit 31.4, white count  8.1, platelets 297.  BUN 12, creatinine 0.9.   DISCHARGE MEDICATIONS:  1.  Protonix 40 mg p.o. b.i.d.  2.  Flovent 110 mcg 4 puffs b.i.d.  3.  Serevent 50 mcg 2 puffs q.12h.  4.  Vytorin 20 mg at bedtime.  5.  Levoxyl 0.088 mg p.o. daily.  6.  Lasix 20 mg p.o. daily.  7.  Norvasc 2.5 mg daily.  8.  Toprol XL 25 mg daily.  9.  Singular 10 mg p.o. daily.  10. Levsin 0.125 mg p.o. t.i.d.  11. Avelox 400 mg p.o. x 3 days.  12. Vicodin 7.5/750 mg 1-2 tabs p.o. q.12h. p.r.n.   FOLLOW UP:  The patient is to follow up with Dr. Hetty Ely on Wednesday, May  31, at 12 noon.  In addition, the patient is to follow up with gynecology  for further evaluation of rule out fibroid.      MSO/MEDQ  D:  11/23/2004  T:  11/23/2004  Job:  [id number]   cc:   Laurita Quint, M.D.  [address] [place]  [place] [address]  Fax: [phone]

## 2010-11-28 ENCOUNTER — Encounter: Payer: Self-pay | Admitting: Internal Medicine

## 2010-11-28 ENCOUNTER — Ambulatory Visit (INDEPENDENT_AMBULATORY_CARE_PROVIDER_SITE_OTHER): Payer: Medicare Other | Admitting: Internal Medicine

## 2010-11-28 ENCOUNTER — Ambulatory Visit (INDEPENDENT_AMBULATORY_CARE_PROVIDER_SITE_OTHER): Payer: Medicare Other

## 2010-11-28 VITALS — BP 112/72 | HR 81 | Ht 60.5 in | Wt 212.8 lb

## 2010-11-28 DIAGNOSIS — J309 Allergic rhinitis, unspecified: Secondary | ICD-10-CM

## 2010-11-28 MED ORDER — EPINEPHRINE 0.15 MG/0.3ML IJ DEVI: 0.1500 mg | Freq: Once | INTRAMUSCULAR | Status: DC | PRN

## 2010-11-28 NOTE — Patient Instructions (Signed)
We will restart your allergy vaccine at reduced dose and build back gradually. The allergy lab will explain this to you. Be sure to call them with any questions.   Keep the Epipen near where you get your allergy shots, in case of severe allergic reaction to the allergy shot.

## 2010-11-28 NOTE — Assessment & Plan Note (Signed)
Actively complaining of return of watery nose, drainage and congestion, not adequately relieved by antihistamines. She wants to go back on her allergy shots. We discussed the risk/ benefit, whether to retest, policy on admin outside of medical office. All things considered,we agreed to restart her at 1:500 of her previous vaccine, which worked well. We discussed and refilled Epipen jr.

## 2010-11-28 NOTE — Progress Notes (Signed)
  Subjective:    Patient ID: [name], female    DOB: 12-25-1930, 77 y.o.   MRN: [id number]  HPI 11/28/10- 56 yoF former smoker followed for allergic rhinitis with hx reactive airways disease, hypoxic respiratory failure ( Dr Delford Field). Husband here. Last here for her allergic bronchitis with me June 07, 2009. She was on allergy vaccine from 09/10/00 and she dropped at last mailing in 2010 when she was off for hospital. Without it she complains of nasal congestion, watery rhinorhea. Dr Delford Field put her back on prednisone last week. She is convinced the shots did help. She had given her own for years and we discussed policy, safety and protocol. Discussed whether to retest.    Review of Systems Constitutional:   No weight loss, night sweats,  Fevers, chills, fatigue, lassitude. HEENT:   No headaches,  Difficulty swallowing,  Tooth/dental problems,  Sore throat,                C/o- sneezing, itching,  nasal congestion, post nasal drip,   CV:  No chest pain,  Orthopnea, PND, swelling in lower extremities, anasarca, dizziness, palpitations  GI  No heartburn, indigestion, abdominal pain, nausea, vomiting, diarrhea, change in bowel habits, loss of appetite  Resp:No excess mucus, no productive cough,  No non-productive cough,  No coughing up of blood.  No change in color of mucus.  No wheezing.   Skin: no rash or lesions.  GU: no dysuria, change in color of urine, no urgency or frequency.  No flank pain.  MS:  No joint pain or swelling.  No decreased range of motion.  No back pain.  Psych:  No change in mood or affect. No depression or anxiety.  No memory loss.      Objective:   Physical Exam General- Alert, Oriented, Affect-appropriate, Distress- none acute  O2 on 2.5 l/m demand    obese  Skin- rash-none, lesions- none, excoriation- none  Lymphadenopathy- none  Head- atraumatic  Eyes- Gross vision intact, PERRLA, conjunctivae clear secretions  Ears- Hearing, canals, Tm -  normal  Nose- Clear, No- Septal dev, mucus, polyps, erosion, perforation   Throat- Mallampati II , mucosa clear , drainage- none, tonsils- atrophic  Neck- flexible , trachea midline, no stridor , thyroid nl, carotid no bruit  Chest - symmetrical excursion , unlabored     Heart/CV- RRR , no murmur , no gallop  , no rub, nl s1 s2                     - JVD- none , edema- none, stasis changes- none, varices- none     Lung- clear to P&A, diminished ,         wheeze- none, cough- none , dullness-none, rub- none     Chest wall-   Abd- tender-no, distended-no, bowel sounds-present, HSM- no  Br/ Gen/ Rectal- Not done, not indicated  Extrem- cyanosis- none, clubbing, none, atrophy- none, strength- nl  Neuro- grossly intact to observation         Assessment & Plan:

## 2010-12-04 ENCOUNTER — Encounter: Payer: Self-pay | Admitting: Internal Medicine

## 2010-12-14 ENCOUNTER — Other Ambulatory Visit: Payer: Self-pay | Admitting: *Deleted

## 2010-12-14 MED ORDER — OXYCODONE-ACETAMINOPHEN 7.5-325 MG PO TABS: ORAL_TABLET | ORAL | Status: DC

## 2010-12-14 NOTE — Telephone Encounter (Signed)
Please  give to patient

## 2010-12-14 NOTE — Telephone Encounter (Signed)
Please call patient when rx is ready

## 2010-12-15 NOTE — Telephone Encounter (Signed)
Notified patient's husband and Rx placed up front for pick up.

## 2010-12-20 ENCOUNTER — Ambulatory Visit: Payer: Medicare Other | Admitting: Internal Medicine

## 2010-12-22 ENCOUNTER — Other Ambulatory Visit: Payer: Self-pay | Admitting: *Deleted

## 2010-12-22 MED ORDER — ZOLPIDEM TARTRATE 10 MG PO TABS: 10.0000 mg | ORAL_TABLET | Freq: Every evening | ORAL | Status: DC | PRN

## 2010-12-22 NOTE — Telephone Encounter (Signed)
This rx was printed and put in my outbox.  Please fax.

## 2010-12-22 NOTE — Telephone Encounter (Signed)
Fax from [facility] is on your desk.  Script was refilled on 09/26/10 for #90, pharmacy must have shorted her 30 tablets and now they need your authorization to fill that amount.

## 2010-12-23 MED ORDER — ZOLPIDEM TARTRATE 10 MG PO TABS: 10.0000 mg | ORAL_TABLET | Freq: Every evening | ORAL | Status: DC | PRN

## 2010-12-23 NOTE — Telephone Encounter (Signed)
Script faxed to [facility].

## 2010-12-28 ENCOUNTER — Ambulatory Visit (INDEPENDENT_AMBULATORY_CARE_PROVIDER_SITE_OTHER): Payer: Medicare Other

## 2010-12-28 DIAGNOSIS — J309 Allergic rhinitis, unspecified: Secondary | ICD-10-CM

## 2011-01-18 ENCOUNTER — Other Ambulatory Visit: Payer: Self-pay | Admitting: *Deleted

## 2011-01-18 MED ORDER — OXYCODONE-ACETAMINOPHEN 7.5-325 MG PO TABS: ORAL_TABLET | ORAL | Status: DC

## 2011-01-18 NOTE — Telephone Encounter (Signed)
Patient notified and Rx placed up front for pick up. 

## 2011-01-18 NOTE — Telephone Encounter (Signed)
Please give to pt.  

## 2011-01-18 NOTE — Telephone Encounter (Signed)
Please call pt when ready.

## 2011-01-30 ENCOUNTER — Encounter: Payer: Self-pay | Admitting: Family Medicine

## 2011-01-30 ENCOUNTER — Ambulatory Visit (INDEPENDENT_AMBULATORY_CARE_PROVIDER_SITE_OTHER): Payer: Medicare Other | Admitting: Family Medicine

## 2011-01-30 DIAGNOSIS — B351 Tinea unguium: Secondary | ICD-10-CM

## 2011-01-30 DIAGNOSIS — G8929 Other chronic pain: Secondary | ICD-10-CM

## 2011-01-30 DIAGNOSIS — G4700 Insomnia, unspecified: Secondary | ICD-10-CM

## 2011-01-30 MED ORDER — POTASSIUM CHLORIDE 10 MEQ PO TBCR: 10.0000 meq | EXTENDED_RELEASE_TABLET | Freq: Every day | ORAL | Status: DC

## 2011-01-30 MED ORDER — AMLODIPINE BESYLATE 2.5 MG PO TABS: 2.5000 mg | ORAL_TABLET | Freq: Every day | ORAL | Status: DC

## 2011-01-30 MED ORDER — OXYCODONE-ACETAMINOPHEN 7.5-325 MG PO TABS: ORAL_TABLET | ORAL | Status: DC

## 2011-01-30 NOTE — Patient Instructions (Signed)
Take the pain medicine every 4 or 5 hours.  Call me if that doesn't help.  Keep taking the stool softener and let me know if you have trouble moving your bowels.  I'll check on the medicine for your toenail and I'll work on the [place].  Take care.

## 2011-01-31 ENCOUNTER — Telehealth: Payer: Self-pay | Admitting: Family Medicine

## 2011-01-31 ENCOUNTER — Encounter: Payer: Self-pay | Admitting: Internal Medicine

## 2011-01-31 ENCOUNTER — Encounter: Payer: Self-pay | Admitting: Family Medicine

## 2011-01-31 ENCOUNTER — Telehealth: Payer: Self-pay | Admitting: *Deleted

## 2011-01-31 ENCOUNTER — Other Ambulatory Visit: Payer: Self-pay | Admitting: Family Medicine

## 2011-01-31 ENCOUNTER — Ambulatory Visit (INDEPENDENT_AMBULATORY_CARE_PROVIDER_SITE_OTHER): Payer: Medicare Other | Admitting: Internal Medicine

## 2011-01-31 VITALS — BP 122/78 | HR 68 | Ht 60.5 in | Wt 214.4 lb

## 2011-01-31 DIAGNOSIS — J302 Other seasonal allergic rhinitis: Secondary | ICD-10-CM

## 2011-01-31 DIAGNOSIS — J3089 Other allergic rhinitis: Secondary | ICD-10-CM

## 2011-01-31 DIAGNOSIS — J309 Allergic rhinitis, unspecified: Secondary | ICD-10-CM

## 2011-01-31 DIAGNOSIS — B351 Tinea unguium: Secondary | ICD-10-CM | POA: Insufficient documentation

## 2011-01-31 MED ORDER — POTASSIUM CHLORIDE 10 MEQ PO TBCR: 10.0000 meq | EXTENDED_RELEASE_TABLET | Freq: Every day | ORAL | Status: DC

## 2011-01-31 NOTE — Telephone Encounter (Signed)
Please call pt.  I checked the urea cream.  I think this is unlikely to effectively treat the nail changes on her foot.  The other option is oral lamisil, but I would avoid this due to potential liver effects.  I would only trim the nail and f/u with podiatry it if got big enough to cause a problem wearing a shoe.  I think treating it o/w would likely not work or cause other problems.

## 2011-01-31 NOTE — Patient Instructions (Signed)
I will have the allergy lab call to check with you and make sure your vaccine instructions are clear.

## 2011-01-31 NOTE — Telephone Encounter (Signed)
[facility] has faxed a form for clarification on potassium script, for the directions.  Form is on your desk.

## 2011-01-31 NOTE — Assessment & Plan Note (Signed)
She insists she can manage vaccine safely. We will have the allergy lab work with her.

## 2011-01-31 NOTE — Assessment & Plan Note (Signed)
Inc percocet to q4-5 hours and call back if not improved.  Sedation and GI cautions given, she understood.  Okay for outpatient fu.  Given her comorbid conditions, it is reasonable to try to palliate the pain.

## 2011-01-31 NOTE — Assessment & Plan Note (Signed)
I'll ask staff to call pharmacy/insurace about the PA.  Continue ambien.

## 2011-01-31 NOTE — Telephone Encounter (Signed)
Will address the hard copy.  

## 2011-01-31 NOTE — Assessment & Plan Note (Signed)
She was asking about topical tx.  I'll check on this and notify pt.

## 2011-01-31 NOTE — Telephone Encounter (Signed)
Spoke with patient and she did see a podiatrist over a year ago, Dr.Secora on [address]. Per pt do not make referral. Spoke with Dr.Duncan and he verbally is ok with this.

## 2011-01-31 NOTE — Progress Notes (Signed)
Subjective:    Patient ID: [name], female    DOB: [birth year], 77 y.o.   MRN: [id number]  HPI    Review of Systems     Objective:   Physical Exam        Assessment & Plan:   Subjective:    Patient ID: [name], female    DOB: [birth year], 77 y.o.   MRN: [id number]  HPI 11/28/10- 54 yoF former smoker followed for allergic rhinitis with hx reactive airways disease, hypoxic respiratory failure ( Dr Delford Field). Husband here. Last here for her allergic bronchitis with me June 07, 2009. She was on allergy vaccine from 09/10/00 and she dropped at last mailing in 2010 when she was off for hospital. Without it she complains of nasal congestion, watery rhinorhea. Dr Delford Field put her back on prednisone last week. She is convinced the shots did help. She had given her own for years and we discussed policy, safety and protocol. Discussed whether to retest.   01/31/11- 79 yoF former smoker followed for allergic rhinitis with hx reactive airways disease, hypoxic respiratory failure ( Dr Delford Field). Husband here. Retested and restarted allergy shots. She may be trying to use up old vial- need lab to go over this with her. No reactions.  Staying on oxygen, not outdoors much and denies cough or wheeze.   Review of Systems Constitutional:   No weight loss, night sweats,  Fevers, chills, fatigue, lassitude. HEENT:   No headaches,  Difficulty swallowing,  Tooth/dental problems,  Sore throat,                C/o- sneezing, itching,  nasal congestion, post nasal drip,   CV:  No chest pain,  Orthopnea, PND, swelling in lower extremities, anasarca, dizziness, palpitations  GI  No heartburn, indigestion, abdominal pain, nausea, vomiting, diarrhea, change in bowel habits, loss of appetite  Resp:No excess mucus, no productive cough,  No non-productive cough,  No coughing up of blood.  No change in color of mucus.  No wheezing.   Skin: no rash or lesions.  GU: no dysuria, change in color of urine,  no urgency or frequency.  No flank pain.  MS:  No joint pain or swelling.  No decreased range of motion.  No back pain.  Psych:  No change in mood or affect. No depression or anxiety.  No memory loss.      Objective:   Physical Exam General- Alert, Oriented, Affect-appropriate, Distress- none acute  O2 on 2.5 l/m demand    Obese cheerful  Skin- rash-none, lesions- none, excoriation- none  Lymphadenopathy- none  Head- atraumatic  Eyes- Gross vision intact, PERRLA, conjunctivae clear secretions  Ears- Hearing, canals, Tm - normal  Nose- Clear, No- Septal dev, mucus, polyps, erosion, perforation   Throat- Mallampati II , mucosa clear , drainage- none, tonsils- atrophic  Neck- flexible , trachea midline, no stridor , thyroid nl, carotid no bruit  Chest - symmetrical excursion , unlabored     Heart/CV- RRR , no murmur , no gallop  , no rub, nl s1 s2                     - JVD- none , edema- none, stasis changes- none, varices- none     Lung- clear to P&A, diminished ,         wheeze- none, cough- none , dullness-none, rub- none   No cough or wheeze     Chest wall-   Abd-  tender-no, distended-no, bowel sounds-present, HSM- no  Br/ Gen/ Rectal- Not done, not indicated  Extrem- cyanosis- none, clubbing, none, atrophy- none, strength- nl  Neuro- grossly intact to observation         Assessment & Plan:

## 2011-01-31 NOTE — Progress Notes (Signed)
Pain continues.Shoulder, back and leg pain continues. Pain continues below the waist and into the legs. Pain with walking. R>L leg pain. She feels unsteady due to the pain. She doesn't want to have surgery.  She was prev injected with temp relief of R knee pain.    She has complicated history with likely arthritic, neuropathic and muscular/fibromalgia pain sources. Continues on a bowel regimen with daily stool softener and as needed laxatives. She is O2 dependent and travel is very difficult for patient.  She does get some relief in pain from the percocet (but it doesn't last 6 hours), lyrica and cymbalta. We had a long discussion today about polypharmacy and the goal to prevent side effects. She is tolerating the meds currently. She is asking for advice.   Insomnia.  Treated well with ambien.  No relief with prev meds (d/w pt today and she had trials of trazodone, antihistamines and TCAs).  Ambien is the only med that had helped.  No ADE.    Meds, vitals, and allergies reviewed.   ROS: See HPI.  Otherwise, noncontributory.  General: GEN: nad, alert and oriented, sore from back pain, on O2  HEENT: mucous membranes moist  NECK: supple w/o LA, no TMG  CV: rrr  PULM: On O2, rare exp wheeze, no inc wob  ABD: soft, +bs  EXT: no edema  Gait w/o foot drop No focal weakness in BLE R first toenail with onychomycosis.

## 2011-02-06 ENCOUNTER — Telehealth: Payer: Self-pay | Admitting: Critical Care Medicine

## 2011-02-06 MED ORDER — FLUTICASONE-SALMETEROL 230-21 MCG/ACT IN AERO: 2.0000 | INHALATION_SPRAY | Freq: Two times a day (BID) | RESPIRATORY_TRACT | Status: DC

## 2011-02-06 MED ORDER — TIOTROPIUM BROMIDE MONOHYDRATE 18 MCG IN CAPS: 18.0000 ug | ORAL_CAPSULE | Freq: Every day | RESPIRATORY_TRACT | Status: DC

## 2011-02-06 NOTE — Telephone Encounter (Signed)
Pt returned call.  Informed her that her rx's have been sent to [facility].  Pt stated that she only wanted her spiriva to go to [facility] and [facility] because she will run out of the medication.  Offered patient sample of her advair hfa but patient stated that she will be unable to come to the office to pick this up today and will run out today.  Informed patient that we will be happy to send her 1 [facility] [address] so that she may pick this up today and then she can receive her regular shipment from [facility].  Pt okay with this and verbalized her understanding.  rx sent.

## 2011-02-06 NOTE — Telephone Encounter (Signed)
Rx for spiriva and advair hfa sent to [facility] and inform pt, NA and no option to leave a msg, WCB.

## 2011-02-08 ENCOUNTER — Telehealth: Payer: Self-pay | Admitting: *Deleted

## 2011-02-08 NOTE — Telephone Encounter (Signed)
I'll address the hard copy.  

## 2011-02-08 NOTE — Telephone Encounter (Signed)
Prior Berkley Harvey is needed for zolpidem, form is on your desk.  This is a renewal.

## 2011-02-12 ENCOUNTER — Other Ambulatory Visit: Payer: Self-pay | Admitting: *Deleted

## 2011-02-12 DIAGNOSIS — G4700 Insomnia, unspecified: Secondary | ICD-10-CM

## 2011-02-12 MED ORDER — ZOLPIDEM TARTRATE 10 MG PO TABS: 10.0000 mg | ORAL_TABLET | Freq: Every evening | ORAL | Status: DC | PRN

## 2011-02-12 MED ORDER — OXYCODONE-ACETAMINOPHEN 7.5-325 MG PO TABS: ORAL_TABLET | ORAL | Status: DC

## 2011-02-12 NOTE — Telephone Encounter (Signed)
Please call in the [place].  Print the oxycodone (same sig, #100, no rf) and then give to me to sign.  Thanks.

## 2011-02-12 NOTE — Telephone Encounter (Signed)
Pt called she was waiting for Ambien to be approved by ins (it was), she is currently out of med. Needs rx sent to her local pharmacy.

## 2011-02-12 NOTE — Telephone Encounter (Signed)
Pt called back, need rx for pain med oxycodone she has 7 left, says dose was increased and she is taking them every 3-4 hours. Her bottle says 3-4 times a day and she said every 3-4 hours is what she is taking.

## 2011-02-13 ENCOUNTER — Other Ambulatory Visit: Payer: Self-pay | Admitting: *Deleted

## 2011-02-13 MED ORDER — OXYCODONE-ACETAMINOPHEN 7.5-325 MG PO TABS: ORAL_TABLET | ORAL | Status: DC

## 2011-02-13 MED ORDER — ZOLPIDEM TARTRATE 10 MG PO TABS: 10.0000 mg | ORAL_TABLET | Freq: Every evening | ORAL | Status: DC | PRN

## 2011-02-13 NOTE — Telephone Encounter (Signed)
Opened in error

## 2011-02-13 NOTE — Telephone Encounter (Signed)
Ambien called to pharmacy. Printed oxycodone for Dr. Lianne Bushy signature.

## 2011-02-13 NOTE — Telephone Encounter (Signed)
Prior auth given for zolpidem, approval letter placed on doctor's desk for signature and scanning. 

## 2011-03-09 ENCOUNTER — Other Ambulatory Visit: Payer: Self-pay | Admitting: *Deleted

## 2011-03-09 MED ORDER — ZOLPIDEM TARTRATE 10 MG PO TABS: 10.0000 mg | ORAL_TABLET | Freq: Every evening | ORAL | Status: DC | PRN

## 2011-03-09 NOTE — Telephone Encounter (Signed)
Medication phoned to pharmacy.  

## 2011-03-09 NOTE — Telephone Encounter (Signed)
Please call in

## 2011-03-15 ENCOUNTER — Other Ambulatory Visit: Payer: Self-pay | Admitting: *Deleted

## 2011-03-15 MED ORDER — OXYCODONE-ACETAMINOPHEN 7.5-325 MG PO TABS: ORAL_TABLET | ORAL | Status: DC

## 2011-03-15 NOTE — Telephone Encounter (Signed)
Please give to pt.  Thanks.   

## 2011-03-15 NOTE — Telephone Encounter (Signed)
Patient advised.  Rx. Left at front desk.

## 2011-03-15 NOTE — Telephone Encounter (Signed)
Please call pt when ready.

## 2011-03-29 ENCOUNTER — Other Ambulatory Visit: Payer: Self-pay | Admitting: Family Medicine

## 2011-04-02 ENCOUNTER — Encounter: Payer: Self-pay | Admitting: Family Medicine

## 2011-04-02 ENCOUNTER — Ambulatory Visit (INDEPENDENT_AMBULATORY_CARE_PROVIDER_SITE_OTHER): Payer: Medicare Other | Admitting: Family Medicine

## 2011-04-02 VITALS — BP 124/60 | HR 74 | Temp 98.1°F | Wt 209.5 lb

## 2011-04-02 DIAGNOSIS — M542 Cervicalgia: Secondary | ICD-10-CM

## 2011-04-02 NOTE — Progress Notes (Signed)
  Subjective:    Patient ID: [name], female    DOB: 15-Jul-1930, 77 y.o.   MRN: [id number]  HPI CC: jaw pain  77 yo new to me with h/o COPD on chronic O2 at 3L , fibromyalgia presents with 3d h/o R jaw pain, feels some swelling as well.  At first thought gum (has bad tooth there).  Pain has travelled to lower neck today.  Very tender. Also tender to lean head over.  Swelling now a bit better.  Mild congestion, increase in cough recently.  No pain similar to this in past.  No fevers/chills, abd pain, n/v/d, rashes.  No ear pain, tooth pain currently.  No vision changes or swelling of eyes, redness.  No sneezing.  Thinks has shrimp allergy.  No cats at home.  Review of Systems Per HPI    Objective:   Physical Exam  Nursing note and vitals reviewed. Constitutional: She appears well-developed and well-nourished. No distress.  HENT:  Head: Normocephalic and atraumatic. No trismus in the jaw.  Right Ear: Hearing, tympanic membrane, external ear and ear canal normal.  Left Ear: Hearing, tympanic membrane, external ear and ear canal normal.  Nose: Nose normal. No mucosal edema or rhinorrhea. Right sinus exhibits no maxillary sinus tenderness and no frontal sinus tenderness. Left sinus exhibits no maxillary sinus tenderness and no frontal sinus tenderness.  Mouth/Throat: Uvula is midline, oropharynx is clear and moist and mucous membranes are normal. She does not have dentures. No oral lesions. Abnormal dentition. No dental abscesses or uvula swelling. No oropharyngeal exudate, posterior oropharyngeal edema, posterior oropharyngeal erythema or tonsillar abscesses.       Several teeth removed.  No tenderness at teeth or gums with oral exam. No TMJ pain or clicks. No parotid or angle of mandible pain.  Eyes: Conjunctivae and EOM are normal. Pupils are equal, round, and reactive to light. No scleral icterus.  Neck: Normal range of motion. Neck supple. Normal carotid pulses present. Carotid  bruit is not present. No mass and no thyromegaly present.         Right neck with tender swelling deep to SCM No erythema or overlying skin changes.  Skin: Skin is warm and dry. No rash noted. No erythema.  Psychiatric: She has a normal mood and affect.          Assessment & Plan:

## 2011-04-02 NOTE — Patient Instructions (Signed)
i think this is a swollen gland from possible recent viral infection.  Treat with warm compresses and continue ibuprofen. If worsening, enlarging or more redness/pain/swelling, please let me know and I will get imaging study done. Let us know if questions.

## 2011-04-02 NOTE — Assessment & Plan Note (Signed)
x3 days.  No fevers, redness, or swelling on exam, overall benign.  Possible deep cervical LAD after viral illness? Treat with NSAID (pt states able to tolerate ibuprofen well) and warm compresses. Advised to notify us if worsening or more swelling, would likely start with neck /soft tissue US if not resolving as expected.

## 2011-04-03 LAB — CBC
HCT: 37.5
Hemoglobin: 12.4
MCHC: 33
MCV: 95.6
Platelets: 250
RBC: 3.92
RDW: 14
WBC: 7.2

## 2011-04-03 LAB — URINE CULTURE
Colony Count: >=100000
Special Requests: POSITIVE

## 2011-04-03 LAB — BASIC METABOLIC PANEL
BUN: 8
CO2: 30
Calcium: 9.4
Chloride: 102
Creatinine, Ser: 0.7
GFR calc Af Amer: >60
GFR calc non Af Amer: >60
Glucose, Bld: 109 — ABNORMAL HIGH
Potassium: 3.5
Sodium: 141

## 2011-04-03 LAB — URINALYSIS, ROUTINE W REFLEX MICROSCOPIC
Bilirubin Urine: NEGATIVE
Glucose, UA: NEGATIVE
Ketones, ur: 15 — AB
Nitrite: POSITIVE — AB
Protein, ur: 30 — AB
Specific Gravity, Urine: 1.023
Urobilinogen, UA: 1
pH: 6.5

## 2011-04-03 LAB — URINE MICROSCOPIC-ADD ON

## 2011-04-10 LAB — URINE CULTURE: Colony Count: 50000

## 2011-04-10 LAB — URINALYSIS, ROUTINE W REFLEX MICROSCOPIC
Glucose, UA: NEGATIVE
Ketones, ur: 15 — AB
Nitrite: NEGATIVE
Protein, ur: NEGATIVE
Specific Gravity, Urine: 1.033 — ABNORMAL HIGH
Urobilinogen, UA: 0.2
pH: 5.5

## 2011-04-10 LAB — CBC
HCT: 32.8 — ABNORMAL LOW
HCT: 37.4
Hemoglobin: 11 — ABNORMAL LOW
Hemoglobin: 12.4
MCHC: 33.1
MCHC: 33.6
MCV: 96.9
MCV: 97.3
Platelets: 177
Platelets: 186
RBC: 3.39 — ABNORMAL LOW
RBC: 3.84 — ABNORMAL LOW
RDW: 14.9
RDW: 15
WBC: 5.1
WBC: 6.2

## 2011-04-10 LAB — POCT I-STAT 3, ART BLOOD GAS (G3+)
Acid-Base Excess: 6 — ABNORMAL HIGH
Bicarbonate: 32.9 — ABNORMAL HIGH
O2 Saturation: 96
TCO2: 35
pCO2 arterial: 59.2
pH, Arterial: 7.353
pO2, Arterial: 89

## 2011-04-10 LAB — BASIC METABOLIC PANEL
BUN: 13
CO2: 33 — ABNORMAL HIGH
Calcium: 8.9
Chloride: 106
Creatinine, Ser: 0.63
GFR calc Af Amer: >60
GFR calc non Af Amer: >60
Glucose, Bld: 107 — ABNORMAL HIGH
Potassium: 3.6
Sodium: 143

## 2011-04-10 LAB — POCT I-STAT, CHEM 8
BUN: 25 — ABNORMAL HIGH
Calcium, Ion: 1.13
Chloride: 104
Creatinine, Ser: 1.1
Glucose, Bld: 103 — ABNORMAL HIGH
HCT: 39
Hemoglobin: 13.3
Potassium: 4.3
Sodium: 141
TCO2: 32

## 2011-04-10 LAB — URINE MICROSCOPIC-ADD ON

## 2011-04-10 LAB — DIFFERENTIAL
Basophils Absolute: 0.1
Basophils Relative: 1
Eosinophils Absolute: 0.2
Eosinophils Relative: 3
Lymphocytes Relative: 38
Lymphs Abs: 2.4
Monocytes Absolute: 0.5
Monocytes Relative: 8
Neutro Abs: 3.1
Neutrophils Relative %: 50

## 2011-04-10 LAB — PROTIME-INR
INR: 1
Prothrombin Time: 13.8

## 2011-04-10 LAB — APTT: aPTT: 33

## 2011-04-11 LAB — URINE CULTURE
Colony Count: 40000
Colony Count: >=100000
Special Requests: NEGATIVE
Special Requests: NEGATIVE

## 2011-04-11 LAB — DIFFERENTIAL
Basophils Absolute: 0
Basophils Relative: 1
Eosinophils Absolute: 0.2
Eosinophils Relative: 4
Lymphocytes Relative: 35
Lymphs Abs: 2
Monocytes Absolute: 0.5
Monocytes Relative: 8
Neutro Abs: 2.9
Neutrophils Relative %: 53

## 2011-04-11 LAB — URINE MICROSCOPIC-ADD ON

## 2011-04-11 LAB — COMPREHENSIVE METABOLIC PANEL
ALT: 11
ALT: 13
AST: 25
AST: 33
Albumin: 3.2 — ABNORMAL LOW
Albumin: 4
Alkaline Phosphatase: 56
Alkaline Phosphatase: 66
BUN: 14
BUN: 8
CO2: 32
CO2: 34 — ABNORMAL HIGH
Calcium: 9
Calcium: 9.6
Chloride: 102
Chloride: 102
Creatinine, Ser: 0.53
Creatinine, Ser: 0.7
GFR calc Af Amer: >60
GFR calc Af Amer: >60
GFR calc non Af Amer: >60
GFR calc non Af Amer: >60
Glucose, Bld: 129 — ABNORMAL HIGH
Glucose, Bld: 99
Potassium: 3 — ABNORMAL LOW
Potassium: 3.5
Sodium: 142
Sodium: 142
Total Bilirubin: 0.4
Total Bilirubin: 1.1
Total Protein: 6.1
Total Protein: 7.7

## 2011-04-11 LAB — CBC
HCT: 33.6 — ABNORMAL LOW
HCT: 38.2
Hemoglobin: 11 — ABNORMAL LOW
Hemoglobin: 13.1
MCHC: 32.8
MCHC: 34.2
MCV: 94.3
MCV: 96.4
Platelets: 171
Platelets: 213
RBC: 3.48 — ABNORMAL LOW
RBC: 4.05
RDW: 14.9
RDW: 15
WBC: 5.1
WBC: 5.6

## 2011-04-11 LAB — LIPID PANEL
Cholesterol: 143
HDL: 40
LDL Cholesterol: 85
Total CHOL/HDL Ratio: 3.6
Triglycerides: 90
VLDL: 18

## 2011-04-11 LAB — RAPID URINE DRUG SCREEN, HOSP PERFORMED
Amphetamines: NOT DETECTED
Barbiturates: NOT DETECTED
Benzodiazepines: POSITIVE — AB
Cocaine: NOT DETECTED
Opiates: POSITIVE — AB
Tetrahydrocannabinol: NOT DETECTED

## 2011-04-11 LAB — HEMOGLOBIN A1C
Hgb A1c MFr Bld: 6
Mean Plasma Glucose: 126

## 2011-04-11 LAB — URINALYSIS, ROUTINE W REFLEX MICROSCOPIC
Bilirubin Urine: NEGATIVE
Glucose, UA: NEGATIVE
Ketones, ur: NEGATIVE
Nitrite: POSITIVE — AB
Protein, ur: NEGATIVE
Specific Gravity, Urine: 1.017
Urobilinogen, UA: 1
pH: 6

## 2011-04-11 LAB — TSH
TSH: 3.994
TSH: 4.007

## 2011-04-11 LAB — VITAMIN B12: Vitamin B-12: 401 (ref 211–911)

## 2011-04-11 LAB — ACETAMINOPHEN LEVEL: Acetaminophen (Tylenol), Serum: <10 — ABNORMAL LOW

## 2011-04-11 LAB — FOLATE RBC: RBC Folate: 336

## 2011-04-11 LAB — RPR: RPR Ser Ql: NONREACTIVE

## 2011-04-12 ENCOUNTER — Other Ambulatory Visit: Payer: Self-pay | Admitting: Orthopedic Surgery

## 2011-04-12 DIAGNOSIS — M25561 Pain in right knee: Secondary | ICD-10-CM

## 2011-04-13 ENCOUNTER — Ambulatory Visit
Admission: RE | Admit: 2011-04-13 | Discharge: 2011-04-13 | Disposition: A | Discharge status: Home / Self Care | Payer: Medicare Other | Source: Ambulatory Visit | Attending: Orthopedic Surgery | Admitting: Orthopedic Surgery

## 2011-04-13 DIAGNOSIS — M25561 Pain in right knee: Secondary | ICD-10-CM

## 2011-04-18 ENCOUNTER — Ambulatory Visit: Payer: Medicare Other | Admitting: Family Medicine

## 2011-04-20 ENCOUNTER — Other Ambulatory Visit: Payer: Self-pay | Admitting: *Deleted

## 2011-04-20 MED ORDER — OXYCODONE-ACETAMINOPHEN 7.5-325 MG PO TABS: ORAL_TABLET | ORAL | Status: DC

## 2011-04-20 NOTE — Telephone Encounter (Signed)
Patient called to request refill of pain medication she also request something to help her sleep.  She stated that she has not been sleeping well because she is in a lot of pain.

## 2011-04-20 NOTE — Telephone Encounter (Signed)
Please give to patient.  I would not add on other sleeping meds.  I would try taking an extra 1/2 tab at night as needed and see if that helped.  Thanks.

## 2011-04-20 NOTE — Telephone Encounter (Signed)
Patient advised.  Rx. Left at front desk for pick up. 

## 2011-04-26 ENCOUNTER — Encounter (HOSPITAL_COMMUNITY)
Admission: RE | Admit: 2011-04-26 | Discharge: 2011-04-26 | Disposition: A | Discharge status: Home / Self Care | Payer: Medicare Other | Source: Ambulatory Visit | Attending: Orthopedic Surgery | Admitting: Orthopedic Surgery

## 2011-04-26 ENCOUNTER — Ambulatory Visit (HOSPITAL_COMMUNITY)
Admission: RE | Admit: 2011-04-26 | Discharge: 2011-04-26 | Disposition: A | Discharge status: Home / Self Care | Payer: Medicare Other | Source: Ambulatory Visit | Attending: Orthopedic Surgery | Admitting: Orthopedic Surgery

## 2011-04-26 ENCOUNTER — Other Ambulatory Visit (HOSPITAL_COMMUNITY): Payer: Self-pay | Admitting: Orthopedic Surgery

## 2011-04-26 DIAGNOSIS — M25561 Pain in right knee: Secondary | ICD-10-CM

## 2011-04-26 DIAGNOSIS — Z01818 Encounter for other preprocedural examination: Secondary | ICD-10-CM | POA: Insufficient documentation

## 2011-04-26 DIAGNOSIS — Z01812 Encounter for preprocedural laboratory examination: Secondary | ICD-10-CM | POA: Insufficient documentation

## 2011-04-26 DIAGNOSIS — Z01810 Encounter for preprocedural cardiovascular examination: Secondary | ICD-10-CM | POA: Insufficient documentation

## 2011-04-26 LAB — CBC
HCT: 36.6 % (ref 36.0–46.0)
Hemoglobin: 12 g/dL (ref 12.0–15.0)
MCH: 30.6 pg (ref 26.0–34.0)
MCHC: 32.8 g/dL (ref 30.0–36.0)
MCV: 93.4 fL (ref 78.0–100.0)
Platelets: 180 10*3/uL (ref 150–400)
RBC: 3.92 MIL/uL (ref 3.87–5.11)
RDW: 14.9 % (ref 11.5–15.5)
WBC: 6.4 10*3/uL (ref 4.0–10.5)

## 2011-04-26 LAB — COMPREHENSIVE METABOLIC PANEL
ALT: 12 U/L (ref 0–35)
AST: 26 U/L (ref 0–37)
Albumin: 3.8 g/dL (ref 3.5–5.2)
Alkaline Phosphatase: 65 U/L (ref 39–117)
BUN: 16 mg/dL (ref 6–23)
CO2: 31 mEq/L (ref 19–32)
Calcium: 10 mg/dL (ref 8.4–10.5)
Chloride: 101 mEq/L (ref 96–112)
Creatinine, Ser: 0.75 mg/dL (ref 0.50–1.10)
GFR calc Af Amer: >90 mL/min (ref >90)
GFR calc non Af Amer: 79 mL/min — ABNORMAL LOW (ref >90)
Glucose, Bld: 106 mg/dL — ABNORMAL HIGH (ref 70–99)
Potassium: 3.7 mEq/L (ref 3.5–5.1)
Sodium: 141 mEq/L (ref 135–145)
Total Bilirubin: 0.5 mg/dL (ref 0.3–1.2)
Total Protein: 7.1 g/dL (ref 6.0–8.3)

## 2011-04-26 LAB — TYPE AND SCREEN
ABO/RH(D): O POS
Antibody Screen: NEGATIVE

## 2011-04-26 LAB — SURGICAL PCR SCREEN
MRSA, PCR: NEGATIVE
Staphylococcus aureus: NEGATIVE

## 2011-04-26 LAB — PROTIME-INR
INR: 0.99 (ref 0.00–1.49)
Prothrombin Time: 13.3 seconds (ref 11.6–15.2)

## 2011-05-01 ENCOUNTER — Ambulatory Visit (HOSPITAL_COMMUNITY)
Admission: RE | Admit: 2011-05-01 | Discharge: 2011-05-02 | Disposition: A | Discharge status: Home Health | Payer: Medicare Other | Source: Ambulatory Visit | Attending: Orthopedic Surgery | Admitting: Orthopedic Surgery

## 2011-05-01 DIAGNOSIS — J4489 Other specified chronic obstructive pulmonary disease: Secondary | ICD-10-CM | POA: Insufficient documentation

## 2011-05-01 DIAGNOSIS — I10 Essential (primary) hypertension: Secondary | ICD-10-CM | POA: Insufficient documentation

## 2011-05-01 DIAGNOSIS — E785 Hyperlipidemia, unspecified: Secondary | ICD-10-CM | POA: Insufficient documentation

## 2011-05-01 DIAGNOSIS — Z9981 Dependence on supplemental oxygen: Secondary | ICD-10-CM | POA: Insufficient documentation

## 2011-05-01 DIAGNOSIS — K219 Gastro-esophageal reflux disease without esophagitis: Secondary | ICD-10-CM | POA: Insufficient documentation

## 2011-05-01 DIAGNOSIS — J449 Chronic obstructive pulmonary disease, unspecified: Secondary | ICD-10-CM | POA: Insufficient documentation

## 2011-05-01 DIAGNOSIS — X58XXXA Exposure to other specified factors, initial encounter: Secondary | ICD-10-CM | POA: Insufficient documentation

## 2011-05-01 DIAGNOSIS — E039 Hypothyroidism, unspecified: Secondary | ICD-10-CM | POA: Insufficient documentation

## 2011-05-01 DIAGNOSIS — IMO0001 Reserved for inherently not codable concepts without codable children: Secondary | ICD-10-CM | POA: Insufficient documentation

## 2011-05-01 DIAGNOSIS — S83289A Other tear of lateral meniscus, current injury, unspecified knee, initial encounter: Secondary | ICD-10-CM | POA: Insufficient documentation

## 2011-05-01 DIAGNOSIS — M2240 Chondromalacia patellae, unspecified knee: Secondary | ICD-10-CM | POA: Insufficient documentation

## 2011-05-04 NOTE — Op Note (Signed)
[name], [name] NO.:  [id number]  MEDICAL RECORD NO.:  [id number]  LOCATION:  5031                         FACILITY:  [facility]  PHYSICIAN:  Eulas Post, MD    DATE OF BIRTH:  [birth year]  DATE OF PROCEDURE:  05/01/2011 DATE OF DISCHARGE:                              OPERATIVE REPORT   ATTENDING SURGEONS:  Eulas Post, MD  FIRST ASSISTANT:  Janace Litten, OPA  PREOPERATIVE DIAGNOSIS:  Right knee medial and lateral meniscus tear.  POSTOPERATIVE DIAGNOSIS:  Right knee medial and lateral meniscus tear with severe extensive grade 4 chondral loss of the lateral compartment on both the femur and the tibia.  OPERATIVE PROCEDURE:  Right knee arthroscopy with partial medial and lateral meniscectomy with femoral trochlear chondroplasty.  ANESTHESIA:  General.  ESTIMATED BLOOD LOSS:  Minimal.  OPERATIVE FINDINGS:  The medial compartment had relatively good chondral covering.  There was maybe grade 1 changes at the most.  There was a posterior horn medial meniscus tear that was fairly small.  The rest of the meniscus looked pretty good.  The ACL and PCL were intact.  The patellofemoral joint had some grade 2 changes on the femoral side, maybe grade 3 at the most.  The lateral compartment was where the majority of the disease was, and had extensive chondral loss throughout the femoral condyle as well as the tibial condyle.  There was a complex tear of the lateral meniscus as well.  PREOPERATIVE INDICATIONS:  [name] is a 77 year old woman who complained of right-sided knee pain that was fairly diffuse.  She failed conservative measures.  She has had a previous left knee arthroscopy, which substantially improved her symptoms and has not caused her any recurrent problems.  The right side however now was severe and we discussed the options including arthroplasty versus arthroscopic intervention versus ongoing conservative management, and she  elected for arthroscopic intervention.  The risks, benefits, and alternatives were discussed at length with her including, but not limited to the risks of infection, bleeding, nerve injury, stiffness, incomplete relief of pain, progression of arthritis, the need for future arthroplasty, cardiopulmonary complications, particularly in light of her pulmonary issues, among others and she is willing to proceed.  OPERATIVE PROCEDURE:  The patient was brought to the operating room and placed in the supine position.  General anesthesia was administered. The right lower extremity was prepped and draped in usual sterile fashion.  Time-out was performed.  Diagnostic arthroscopy was carried out with the above-named findings.  I used the arthroscopic shaver as well as the ArthroCare to clean the patellofemoral joint and performed a light chondroplasty.  I also resected the posterior horn of the medial meniscus using the arthroscopic biter and the shaver.  A smooth contour was achieved.  The lateral compartment was also visualized and had extensive chondral loss and I debrided and basically removed almost the entirety of the lateral meniscus.  This was brought back to a smooth contour, although the majority of the meniscus had to be removed. Hemostasis was achieved.  The medial and lateral gutters were also inspected and were normal.  The knee was drained and injected, and the portal  was closed with Monocryl with Steri-Strips and sterile gauze. She was awakened and returned to the PACU in stable and satisfactory condition.  There were no complications and she tolerated the procedure well.  The big question will be how much relief she gets from the meniscectomy, versus whether or not the severe chondral loss on the lateral compartment will be the dominant factor in her recovery.     Eulas Post, MD     JPL/MEDQ  D:  05/01/2011  T:  05/01/2011  Job:  [id number]  Electronically Signed by Teryl Lucy MD on 05/04/2011 07:46:45 AM

## 2011-05-04 NOTE — Discharge Summary (Signed)
  [name], [name]               ACCOUNT NO.:  [id number]  MEDICAL RECORD NO.:  [id number]  LOCATION:  5031                         FACILITY:  [facility]  PHYSICIAN:  Eulas Post, MD    DATE OF BIRTH:  October 12, 1930  DATE OF ADMISSION:  05/01/2011 DATE OF DISCHARGE:  05/02/2011                              DISCHARGE SUMMARY   ADMISSION DIAGNOSIS:  Right knee medial and lateral meniscus tear.  POSTOPERATIVE DIAGNOSIS:  Right knee medial and lateral meniscus tear with extensive chondral loss of the lateral compartment.  ADDITIONAL DIAGNOSES: 1. History of urinary tract infection. 2. Fibromyalgia. 3. Reflux disease. 4. Oxygen-dependent chronic obstructive pulmonary disease. 5. Hypertension. 6. Hypothyroidism. 7. Dyslipidemia.  HOSPITAL COURSE:  Ms. [name] is a 77 year old woman who elected for a right knee arthroscopy.  She had a previous left knee arthroscopy and did very well and wished to have the right knee cleaned out.  She tolerated the procedure well and postoperatively did not have any complications.  She was monitored overnight in the hospital due to her pulmonary function.  She did not have any episodes of hypoxia, and was tolerating a regular diet.  Her dressings are clean and dry.  She is weightbearing as tolerated on her lower extremities and was able to ambulate and passed physical therapy.  She is planned to be discharged home with followup with me in 2 weeks.  There are no complications and she can be weightbearing as tolerated.  She was given perioperative sequential compression devices and early ambulation as well as Lovenox for DVT prophylaxis.  She benefited maximally from her hospital stay and will follow up in 2 weeks.  There were no complications.     Eulas Post, MD     JPL/MEDQ  D:  05/02/2011  T:  05/03/2011  Job:  [id number]  Electronically Signed by Teryl Lucy MD on 05/04/2011 07:46:48 AM

## 2011-05-21 ENCOUNTER — Telehealth: Payer: Self-pay | Admitting: *Deleted

## 2011-05-21 NOTE — Telephone Encounter (Signed)
Home health nurse reports that pt is retaining fluid in her ankles up to her knees.  This has been going on since her knee surgery a few weeks ago.  She is asking if she can have lasix to take until her follow up visit with her surgeon on 12/5.  She called her surgeon's office but they are not in today.  [facility]ses [facility] [address].

## 2011-05-21 NOTE — Telephone Encounter (Signed)
She needs to get seen before I can change her meds, esp postoperatively.  I can't rx this over the phone. I can see her tomorrow.  If she is more sob than normal in the setting of inc in BLE edema, then she needs ER eval.

## 2011-05-21 NOTE — Telephone Encounter (Signed)
Patient advised.  Says she will call the surgeon's office and if they can't see her, she will call back here for an appt tomorrow.

## 2011-05-30 ENCOUNTER — Telehealth: Payer: Self-pay | Admitting: Family Medicine

## 2011-05-30 MED ORDER — OXYCODONE-ACETAMINOPHEN 7.5-325 MG PO TABS: ORAL_TABLET | ORAL | Status: DC

## 2011-05-30 NOTE — Telephone Encounter (Signed)
Script written. Tell her hi as well!

## 2011-05-30 NOTE — Telephone Encounter (Signed)
Pt has knee surgery and is on Oxycodone with Tylenol 325 7.5 100 tabs q4-5h prn and another 1/2 prn at night for pain.  Requesting refill and said she can pick it up on Friday.  Call patient to confirm refill info and when it's ready to pick up at [phone].  She also says to tell Dr. Hetty Ely hello from her.

## 2011-05-30 NOTE — Telephone Encounter (Signed)
Patient advised.  Rx left at front desk for pick up. 

## 2011-06-14 ENCOUNTER — Emergency Department (HOSPITAL_COMMUNITY)
Admission: EM | Admit: 2011-06-14 | Discharge: 2011-06-14 | Disposition: A | Discharge status: Home / Self Care | Payer: Medicare Other | Attending: Emergency Medicine | Admitting: Emergency Medicine

## 2011-06-14 ENCOUNTER — Emergency Department (HOSPITAL_COMMUNITY): Payer: Medicare Other

## 2011-06-14 DIAGNOSIS — I517 Cardiomegaly: Secondary | ICD-10-CM | POA: Insufficient documentation

## 2011-06-14 DIAGNOSIS — Z79899 Other long term (current) drug therapy: Secondary | ICD-10-CM | POA: Insufficient documentation

## 2011-06-14 DIAGNOSIS — E785 Hyperlipidemia, unspecified: Secondary | ICD-10-CM | POA: Insufficient documentation

## 2011-06-14 DIAGNOSIS — J111 Influenza due to unidentified influenza virus with other respiratory manifestations: Secondary | ICD-10-CM | POA: Insufficient documentation

## 2011-06-14 DIAGNOSIS — I10 Essential (primary) hypertension: Secondary | ICD-10-CM | POA: Insufficient documentation

## 2011-06-14 LAB — URINALYSIS, ROUTINE W REFLEX MICROSCOPIC
Bilirubin Urine: NEGATIVE
Glucose, UA: NEGATIVE mg/dL
Hgb urine dipstick: NEGATIVE
Leukocytes, UA: NEGATIVE
Nitrite: NEGATIVE
Protein, ur: NEGATIVE mg/dL
Specific Gravity, Urine: 1.019 (ref 1.005–1.030)
Urobilinogen, UA: 0.2 mg/dL (ref 0.0–1.0)
pH: 7.5 (ref 5.0–8.0)

## 2011-06-14 MED ORDER — OSELTAMIVIR PHOSPHATE 75 MG PO CAPS: 75.0000 mg | ORAL_CAPSULE | Freq: Two times a day (BID) | ORAL | Status: AC

## 2011-06-14 MED ORDER — SODIUM CHLORIDE 0.9 % IV BOLUS (SEPSIS)
250.0000 mL | Freq: Once | INTRAVENOUS | Status: AC
  Administered 2011-06-14: 250 mL via INTRAVENOUS

## 2011-06-14 MED ORDER — ACETAMINOPHEN 325 MG PO TABS
650.0000 mg | ORAL_TABLET | Freq: Once | ORAL | Status: AC
  Administered 2011-06-14: 650 mg via ORAL
  Filled 2011-06-14: qty 2

## 2011-06-14 NOTE — ED Provider Notes (Signed)
History     CSN: [id number] Arrival date & time: 06/14/2011  4:13 PM   First MD Initiated Contact with Patient 06/14/11 1635      Chief Complaint  Patient presents with  . Influenza   pleasant elderly female that states she received her flu shot 2 days ago. Yesterday evening she began having sneezing and coughing, and myalgias. Today she states her cough progressed and was feeling weak and having body aches. She states she had a fever at home of "94.7, and, "she's had no vomiting, minimal headache minimal sore throat. Temperature in triage is 99.4. She states she is feeling much better now that she is here  (Consider location/radiation/quality/duration/timing/severity/associated sxs/prior treatment) HPI  Past Medical History  Diagnosis Date  . Osteoporosis, unspecified   . Allergic rhinitis, cause unspecified   . Other chronic pain   . Other abnormal blood chemistry   . Insomnia, unspecified   . Acute bronchitis   . Need for prophylactic hormone replacement therapy (postmenopausal)   . Degeneration of intervertebral disc, site unspecified   . Allergy, unspecified not elsewhere classified   . Myalgia and myositis, unspecified   . Unspecified essential hypertension   . Other and unspecified hyperlipidemia   . Esophageal reflux   . Diverticulosis of colon (without mention of hemorrhage)   . Unspecified hypothyroidism     Past Surgical History  Procedure Date  . Breast cystectomy   . Back surgery   . Shoulder surgery   . Knee arthroscopy     Family History  Problem Relation Age of Onset  . Lung cancer Brother     x2  . Hypertension Father   . Heart disease Mother   . Asthma Sister     History  Substance Use Topics  . Smoking status: Former Smoker -- 1.5 packs/day for 10 years    Types: Cigarettes    Quit date: 07/09/1985  . Smokeless tobacco: Never Used   Comment: 1 1/2 ppd x 20 years  . Alcohol Use: No    OB History    Grav Para Term Preterm Abortions TAB  SAB Ect Mult Living                  Review of Systems  All other systems reviewed and are negative.    Allergies  Sulfadiazine  Home Medications   Current Outpatient Rx  Name Route Sig Dispense Refill  . ALBUTEROL SULFATE HFA 108 (90 BASE) MCG/ACT IN AERS Inhalation Inhale 2 puffs into the lungs every 6 (six) hours as needed.      . ALPRAZOLAM 1 MG PO TABS  1/2 to 1 twice a day as needed     . AMLODIPINE BESYLATE 2.5 MG PO TABS Oral Take 1 tablet (2.5 mg total) by mouth daily. 90 tablet 3  . CALTRATE 600 PO Oral Take 1 tablet by mouth daily.      Marland Kitchen VITAMIN D3 1000 UNITS PO CAPS Oral Take 1 capsule by mouth daily.      . CYMBALTA 60 MG PO CPEP  TAKE 1 CAPSULE DAILY 90 capsule 2  . EPINEPHRINE 0.15 MG/0.3ML IJ DEVI Intramuscular Inject 0.3 mLs (0.15 mg total) into the muscle once as needed for anaphylaxis. / Severe allergic reaction 1 each 6  . FLUTICASONE FUROATE 27.5 MCG/SPRAY NA SUSP Nasal 2 sprays by Nasal route daily.      Marland Kitchen FLUTICASONE-SALMETEROL 230-21 MCG/ACT IN AERO Inhalation Inhale 2 puffs into the lungs 2 (two) times daily. 1 Inhaler  0  . LEVOTHYROXINE SODIUM 112 MCG PO TABS Oral Take 112 mcg by mouth daily.      . MULTIVITAMINS PO CAPS Oral Take 1 capsule by mouth daily.      . OXYCODONE-ACETAMINOPHEN 7.5-325 MG PO TABS  Take one by every 4 or 5 hours as needed. May take an extra 1/2 tab at night for pain if needed. 100 tablet 0  . POTASSIUM CHLORIDE 10 MEQ PO TBCR Oral Take 1 tablet (10 mEq total) by mouth daily. 90 tablet 3  . PREGABALIN 50 MG PO CAPS Oral Take 50 mg by mouth 2 (two) times daily.      Marland Kitchen RANITIDINE HCL 150 MG PO TABS Oral Take 150 mg by mouth 2 (two) times daily.      . SUMATRIPTAN SUCCINATE 50 MG PO TABS Oral Take 50 mg by mouth every 2 (two) hours as needed.      Marland Kitchen TIOTROPIUM BROMIDE MONOHYDRATE 18 MCG IN CAPS Inhalation Place 1 capsule (18 mcg total) into inhaler and inhale daily. 90 capsule 1  . VITAMIN B-12 1000 MCG PO TABS Oral Take 1,000 mcg by  mouth daily.      Marland Kitchen ZOLPIDEM TARTRATE 10 MG PO TABS Oral Take 1 tablet (10 mg total) by mouth at bedtime as needed for sleep. 90 tablet 1    BP 125/69  Pulse 95  Temp(Src) 99.4 F (37.4 C) (Oral)  Resp 18  SpO2 97%  Physical Exam  Nursing note and vitals reviewed. Constitutional: She is oriented to person, place, and time. She appears well-developed and well-nourished.  HENT:  Head: Normocephalic and atraumatic.  Mouth/Throat: Oropharynx is clear and moist.  Eyes: Conjunctivae and EOM are normal. Pupils are equal, round, and reactive to light.  Neck: Neck supple.  Cardiovascular: Normal rate and regular rhythm.  Exam reveals no gallop and no friction rub.   No murmur heard. Pulmonary/Chest: Breath sounds normal. No respiratory distress. She has no wheezes. She has no rales. She exhibits no tenderness.  Abdominal: Soft. Bowel sounds are normal. She exhibits no distension. There is no tenderness. There is no rebound and no guarding.  Musculoskeletal: Normal range of motion.  Neurological: She is alert and oriented to person, place, and time. No cranial nerve deficit. Coordination normal.  Skin: Skin is warm and dry. No rash noted. No pallor.  Psychiatric: She has a normal mood and affect.    ED Course  Procedures (including critical care time)   Labs Reviewed  URINALYSIS, ROUTINE W REFLEX MICROSCOPIC   No results found.   No diagnosis found.    MDM  Pt is seen and examined;  Initial history and physical completed.  Will follow.        Results for orders placed during the hospital encounter of 06/14/11  URINALYSIS, ROUTINE W REFLEX MICROSCOPIC      Component Value Range   Color, Urine YELLOW  YELLOW    APPearance CLEAR  CLEAR    Specific Gravity, Urine 1.019  1.005 - 1.030    pH 7.5  5.0 - 8.0    Glucose, UA NEGATIVE  NEGATIVE (mg/dL)   Hgb urine dipstick NEGATIVE  NEGATIVE    Bilirubin Urine NEGATIVE  NEGATIVE    Ketones, ur TRACE (*) NEGATIVE (mg/dL)    Protein, ur NEGATIVE  NEGATIVE (mg/dL)   Urobilinogen, UA 0.2  0.0 - 1.0 (mg/dL)   Nitrite NEGATIVE  NEGATIVE    Leukocytes, UA NEGATIVE  NEGATIVE    Dg Chest 2 View  06/14/2011  *RADIOLOGY REPORT*  Clinical Data: Coughing and sneezing.  Myalgia.  CHEST - 2 VIEW  Comparison: 04/26/2011.  Findings: Cardiomegaly.  Calcified tortuous aorta. No infiltrates or failure. Chronic changes of COPD.  No effusion or pneumothorax. Previous shoulder replacement.  IMPRESSION: Cardiomegaly, no definite active process. No significant change from priors.  Original Report Authenticated By: Elsie Stain, M.D.      7:06 PM  Patient remains stable, afebrile. We'll give her gentle fluid bolus, and discharged home on Tamiflu. Instructions were reviewed in detail and patient expresses understanding     Jaiquan Temme A. Patrica Duel, MD 06/14/11 7829

## 2011-06-14 NOTE — ED Notes (Signed)
RUE:[id number]<JW> Expected date:06/14/11<BR> Expected time: 4:05 PM<BR> Means of arrival:<BR> Comments:<BR>

## 2011-06-14 NOTE — ED Notes (Signed)
Per EMS, pt from home, VSS--96% on 3L/min [place]; 02 dependent at home for COPD, 108/80; 100. PHMX COPD, asthma, fibromyalgia, knee and shoulder replacements, falls at home, pt is non ambulatory, allergies to sulfonamides. Husband to follow ambulance.

## 2011-06-14 NOTE — ED Notes (Signed)
Patient is resting comfortably. 

## 2011-06-14 NOTE — ED Notes (Signed)
Vital signs stable. 

## 2011-06-14 NOTE — ED Notes (Signed)
MD at bedside. 

## 2011-06-14 NOTE — ED Notes (Signed)
Family at bedside. 

## 2011-06-14 NOTE — ED Notes (Signed)
Patient transported to X-ray 

## 2011-06-15 ENCOUNTER — Other Ambulatory Visit: Payer: Self-pay | Admitting: *Deleted

## 2011-06-15 ENCOUNTER — Ambulatory Visit: Payer: Medicare Other | Admitting: Critical Care Medicine

## 2011-06-15 MED ORDER — LEVOTHYROXINE SODIUM 112 MCG PO TABS: 112.0000 ug | ORAL_TABLET | Freq: Every day | ORAL | Status: DC

## 2011-06-18 ENCOUNTER — Telehealth: Payer: Self-pay | Admitting: Critical Care Medicine

## 2011-06-18 NOTE — Telephone Encounter (Addendum)
Spoke with the pt and she states she went to the ER on 06-14-11 and was diagnosed with the Flu. She states she still does not feel well and wanted to know if there was anything special she should do since she has COPD. I advised her to continue her inhalers as directed, use albuterol as needed, and treat symptoms and use tylenol as directed for fever and body aches. The pt did not fill rx for tamiflu prescribed by ER doc. She states all the pharmacies were out of it.  I advised the pt to rest, drink plenty of fluids and follow above recs. I also advised if she gets worse or has increased SOB then she should go to ER.  Pt states understanding. Carron Curie, CMA

## 2011-06-19 ENCOUNTER — Other Ambulatory Visit: Payer: Self-pay | Admitting: *Deleted

## 2011-06-19 NOTE — Telephone Encounter (Signed)
Fax is in your in box.  If you agree to this, I need to fax it back to [facility].

## 2011-06-20 MED ORDER — ALPRAZOLAM 1 MG PO TABS: ORAL_TABLET | ORAL | Status: DC

## 2011-06-20 NOTE — Telephone Encounter (Signed)
Faxed

## 2011-06-20 NOTE — Telephone Encounter (Signed)
Done, please send in.  

## 2011-06-25 ENCOUNTER — Telehealth: Payer: Self-pay | Admitting: Critical Care Medicine

## 2011-06-25 NOTE — Telephone Encounter (Signed)
Pt is scheduled to see TP tomorrow at 2:00 in the [facility] office. Pt aware of of the address and apt time.

## 2011-06-26 ENCOUNTER — Encounter: Payer: Self-pay | Admitting: Adult Health

## 2011-06-26 ENCOUNTER — Ambulatory Visit (INDEPENDENT_AMBULATORY_CARE_PROVIDER_SITE_OTHER): Payer: Medicare Other | Admitting: Adult Health

## 2011-06-26 VITALS — BP 120/72 | HR 90 | Temp 98.0°F | Ht 60.5 in

## 2011-06-26 DIAGNOSIS — J45909 Unspecified asthma, uncomplicated: Secondary | ICD-10-CM

## 2011-06-26 DIAGNOSIS — J449 Chronic obstructive pulmonary disease, unspecified: Secondary | ICD-10-CM | POA: Insufficient documentation

## 2011-06-26 MED ORDER — HYDROCOD POLST-CHLORPHEN POLST 10-8 MG/5ML PO LQCR: 5.0000 mL | Freq: Two times a day (BID) | ORAL | Status: DC | PRN

## 2011-06-26 MED ORDER — ALBUTEROL SULFATE (2.5 MG/3ML) 0.083% IN NEBU
2.5000 mg | INHALATION_SOLUTION | Freq: Once | RESPIRATORY_TRACT | Status: AC
  Administered 2011-06-26: 2.5 mg via RESPIRATORY_TRACT

## 2011-06-26 MED ORDER — PREDNISONE 10 MG PO TABS: ORAL_TABLET | ORAL | Status: DC

## 2011-06-26 MED ORDER — LEVOFLOXACIN 500 MG PO TABS: 500.0000 mg | ORAL_TABLET | Freq: Every day | ORAL | Status: DC

## 2011-06-26 NOTE — Patient Instructions (Addendum)
Levaquin 500mg  daily for 7 days with food.  Mucinex DM Twice daily  As needed  Cough/congestion  Prednisone taper over next week.  Tussionex 1 tsp every 12 hrs as needed for cough- may make you sleepy  Please contact office for sooner follow up if symptoms do not improve or worsen or seek emergency care  follow up Dr. Delford Field   In 4 weeks and As needed

## 2011-06-26 NOTE — Progress Notes (Signed)
Subjective:    Patient ID: [name], female    DOB: 10-25-1930, 77 y.o.   MRN: [id number]  HPI 11/28/10- 64 yoF former smoker followed for allergic rhinitis with hx reactive airways disease, hypoxic respiratory failure ( Dr Delford Field). Husband here. Last here for her allergic bronchitis with me June 07, 2009. She was on allergy vaccine from 09/10/00 and she dropped at last mailing in 2010 when she was off for hospital. Without it she complains of nasal congestion, watery rhinorhea. Dr Delford Field put her back on prednisone last week. She is convinced the shots did help. She had given her own for years and we discussed policy, safety and protocol. Discussed whether to retest.   01/31/11- 79 yoF former smoker followed for allergic rhinitis with hx reactive airways disease, hypoxic respiratory failure ( Dr Delford Field). Husband here. Retested and restarted allergy shots. She may be trying to use up old vial- need lab to go over this with her. No reactions.  Staying on oxygen, not outdoors much and denies cough or wheeze.   06/26/2011 Acute OV   Complains of c/o flu x 2 weeks, T-101 yesterday, cough intermittent productive with clear to white mucus. No recent abx . No hemoptysis, no edema. Cough is barking. Hard to get up mucus. Wheezing and cough is getting worse. Seen 2 weeks ago  In ER, dx with flu -rx Tamiflu but was unable to get at drug store. Fever and body aches are gone but cough is staying around. CXR showed no acute process. OTC not helping.     Review of Systems Constitutional:   No  weight loss, night sweats,  Fevers, chills, fatigue, or  lassitude.  HEENT:   No headaches,  Difficulty swallowing,  Tooth/dental problems, or  Sore throat,                No sneezing, itching, ear ache,  +nasal congestion, post nasal drip,   CV:  No chest pain,  Orthopnea, PND, swelling in lower extremities, anasarca, dizziness, palpitations, syncope.   GI  No heartburn, indigestion, abdominal pain, nausea,  vomiting, diarrhea, change in bowel habits, loss of appetite, bloody stools.   Resp:    No coughing up of blood.     No chest wall deformity  Skin: no rash or lesions.  GU: no dysuria, change in color of urine, no urgency or frequency.  No flank pain, no hematuria   MS:  No joint pain or swelling.  No decreased range of motion.  No back pain.  Psych:  No change in mood or affect. No depression or anxiety.  No memory loss.         Objective:   Physical Exam  GEN: A/Ox3; pleasant , NAD, well nourished   HEENT:  [place]/AT,  EACs-clear, TMs-wnl, NOSE-clear drainage , THROAT-clear, no lesions, no postnasal drip or exudate noted.   NECK:  Supple w/ fair ROM; no JVD; normal carotid impulses w/o bruits; no thyromegaly or nodules palpated; no lymphadenopathy.  RESP  Coarse BS w/ few exp wheezing i.no accessory muscle use, no dullness to percussion  CARD:  RRR, no m/r/g  , no peripheral edema, pulses intact, no cyanosis or clubbing.  GI:   Soft & nt; nml bowel sounds; no organomegaly or masses detected.  Musco: Warm bil, no deformities or joint swelling noted.   Neuro: alert, no focal deficits noted.    Skin: Warm, no lesions or rashes        Assessment & Plan:

## 2011-06-26 NOTE — Progress Notes (Signed)
Addended by: Julaine Hua on: 06/26/2011 04:25 PM   Modules accepted: Orders

## 2011-06-26 NOTE — Assessment & Plan Note (Addendum)
Flare  Albuterol neb in office given   Plan:  Levaquin 500mg  daily for 7 days with food.  Mucinex DM Twice daily  As needed  Cough/congestion  Prednisone taper over next week.  Tussionex 1 tsp every 12 hrs as needed for cough- may make you sleepy  Please contact office for sooner follow up if symptoms do not improve or worsen or seek emergency care  follow up Dr. Delford Field   In 4 weeks and As needed

## 2011-06-27 ENCOUNTER — Telehealth: Payer: Self-pay | Admitting: Internal Medicine

## 2011-06-27 ENCOUNTER — Telehealth: Payer: Self-pay | Admitting: Adult Health

## 2011-06-27 ENCOUNTER — Ambulatory Visit: Payer: Medicare Other | Admitting: Adult Health

## 2011-06-27 NOTE — Telephone Encounter (Signed)
Zella Ball called from [facility] and would like a verbal order to Re certify patient.  Is this ok?

## 2011-06-27 NOTE — Telephone Encounter (Signed)
What is the nature of the ongoing home health recert?  Please give me more detail.

## 2011-06-27 NOTE — Telephone Encounter (Signed)
I spoke with spouse and he states that pt's levaquin and prednisone rx was to be called into [facility] yesterday and was not. I advised them it was sent to [facility]. He states they where not aware of that. I have these 2 rx's into [facility]. Spouse is aware and nothing further was needed

## 2011-06-28 NOTE — Telephone Encounter (Signed)
Patient was seen by Dr.Landau for surgery on her knee, she has COPD and sees Dr.Wright, she was diagnosed with bronchitis and was put on prednisone and per nurse right now wouldn't be a good time for her to discharge pt, she's having a hard time with the bronchitis, she on oxygen all the time and the nurse thinks she would need extra help. The original home health order came from Dr.Landau but he won't sign it, she really needs it for the bronchitis.

## 2011-06-28 NOTE — Telephone Encounter (Signed)
Please give verbal OK, due to home O2 use and h/o hypoxemia.  Thanks.

## 2011-06-28 NOTE — Telephone Encounter (Signed)
Spoke with nurse and advised results  

## 2011-07-04 ENCOUNTER — Other Ambulatory Visit: Payer: Self-pay | Admitting: Internal Medicine

## 2011-07-04 MED ORDER — OXYCODONE-ACETAMINOPHEN 7.5-325 MG PO TABS: ORAL_TABLET | ORAL | Status: DC

## 2011-07-04 NOTE — Telephone Encounter (Signed)
Refill request on Oxycodone/APAP.

## 2011-07-05 ENCOUNTER — Telehealth: Payer: Self-pay | Admitting: Critical Care Medicine

## 2011-07-05 MED ORDER — PREDNISONE 10 MG PO TABS: ORAL_TABLET | ORAL | Status: DC

## 2011-07-05 MED ORDER — TRAMADOL HCL 50 MG PO TABS: ORAL_TABLET | ORAL | Status: DC

## 2011-07-05 NOTE — Telephone Encounter (Signed)
I spoke with pt and is aware of cdy recs. Rx's have been sent to pharmacy. Nothing further was needed

## 2011-07-05 NOTE — Telephone Encounter (Signed)
I spoke with pt and she c/o a terrible cough, wheezing, chest congestion x 2 months now. Pt states it has been getting worse x 3 weeks though. She denies any fever, nausea, vomiting. Pt has been taking robitussin and mucinex. She is requesting something stronger than the robitussin. Since PW is not in will forward to Dr. Maple Hudson. Please advise, thanks  Allergies  Allergen Reactions  . Sulfadiazine     REACTION: Fever, achey

## 2011-07-05 NOTE — Telephone Encounter (Signed)
Per CY-okay to give Prednisone 10 mg #20 take 4 x 2 days, 3 x 2 days, 2 x 2 days, 1 x 2 days, then stop no refills and Tramadol 50 mg #10 take 1 po every 6 hours prn cough no refills.

## 2011-07-05 NOTE — Telephone Encounter (Signed)
Patient notified that script is ready for pick up.

## 2011-07-06 ENCOUNTER — Telehealth: Payer: Self-pay | Admitting: Critical Care Medicine

## 2011-07-06 MED ORDER — TIOTROPIUM BROMIDE MONOHYDRATE 18 MCG IN CAPS: 18.0000 ug | ORAL_CAPSULE | Freq: Every day | RESPIRATORY_TRACT | Status: DC

## 2011-07-06 NOTE — Telephone Encounter (Signed)
I spoke with pt and advised her that we did not have any samples of spiriva. Pt then asked that we send rx into [facility] for her then. Rx has been sent. Pt needed nothing further

## 2011-07-06 NOTE — Telephone Encounter (Signed)
Pt called back again to ask if samples are available today. [name]

## 2011-07-12 ENCOUNTER — Telehealth: Payer: Self-pay | Admitting: Critical Care Medicine

## 2011-07-12 NOTE — Telephone Encounter (Signed)
Pt c/o cough x 2 months, occasional increased sob and wheezing. She says this is all upper airway and not in her lungs. She is requesting an appt and will see Dr. Shelle Iron on Fri., 07/13/11 @ 3pm. Pt to go to the ER is sxs gets worse.

## 2011-07-13 ENCOUNTER — Ambulatory Visit: Payer: Medicare Other | Admitting: Pulmonary Disease

## 2011-07-17 ENCOUNTER — Ambulatory Visit (INDEPENDENT_AMBULATORY_CARE_PROVIDER_SITE_OTHER): Payer: Medicare Other | Admitting: Internal Medicine

## 2011-07-17 ENCOUNTER — Encounter: Payer: Self-pay | Admitting: Internal Medicine

## 2011-07-17 VITALS — BP 120/68 | HR 95 | Ht 61.0 in | Wt 205.0 lb

## 2011-07-17 DIAGNOSIS — J45909 Unspecified asthma, uncomplicated: Secondary | ICD-10-CM

## 2011-07-17 DIAGNOSIS — J3089 Other allergic rhinitis: Secondary | ICD-10-CM

## 2011-07-17 DIAGNOSIS — J302 Other seasonal allergic rhinitis: Secondary | ICD-10-CM

## 2011-07-17 DIAGNOSIS — J309 Allergic rhinitis, unspecified: Secondary | ICD-10-CM

## 2011-07-17 MED ORDER — FUROSEMIDE 20 MG PO TABS: 20.0000 mg | ORAL_TABLET | Freq: Every day | ORAL | Status: DC

## 2011-07-17 MED ORDER — TRAMADOL HCL 50 MG PO TABS: 50.0000 mg | ORAL_TABLET | Freq: Four times a day (QID) | ORAL | Status: DC | PRN

## 2011-07-17 MED ORDER — BENZONATATE 200 MG PO CAPS: 200.0000 mg | ORAL_CAPSULE | Freq: Three times a day (TID) | ORAL | Status: AC | PRN

## 2011-07-17 NOTE — Progress Notes (Signed)
Patient ID: [name], female    DOB: July 22, 1930, 78 y.o.   MRN: [id number]  Cough   11/28/10- 78 yoF former smoker followed for allergic rhinitis with hx reactive airways disease, hypoxic respiratory failure ( Dr Delford Field). Husband here. Last here for her allergic bronchitis with me June 07, 2009. She was on allergy vaccine from 09/10/00 and she dropped at last mailing in 2010 when she was off for hospital. Without it she complains of nasal congestion, watery rhinorhea. Dr Delford Field put her back on prednisone last week. She is convinced the shots did help. She had given her own for years and we discussed policy, safety and protocol. Discussed whether to retest.   01/31/11- 78 yoF former smoker followed for allergic rhinitis with hx reactive airways disease, hypoxic respiratory failure ( Dr Delford Field). Husband here. Retested and restarted allergy shots. She may be trying to use up old vial- need lab to go over this with her. No reactions.  Staying on oxygen, not outdoors much and denies cough or wheeze.   07/17/2011 Acute OV   Complains of c/o flu x 2 weeks, T-101 yesterday, cough intermittent productive with clear to white mucus. No recent abx . No hemoptysis, no edema. Cough is barking. Hard to get up mucus. Wheezing and cough is getting worse. Seen 2 weeks ago  In ER, dx with flu -rx Tamiflu but was unable to get at drug store. Fever and body aches are gone but cough is staying around. CXR showed no acute process. OTC not helping.  06/26/2011 Acute OV  Complains of c/o flu x 2 weeks, T-101 yesterday, cough intermittent productive with clear to white mucus. No recent abx . No hemoptysis, no edema. Cough is barking. Hard to get up mucus. Wheezing and cough is getting worse. Seen 2 weeks ago In ER, dx with flu -rx Tamiflu but was unable to get at drug store. Fever and body aches are gone but cough is staying around. CXR showed no acute process. OTC not helping.   07/17/11- 78 yoF former smoker followed  for allergic rhinitis with hx reactive airways disease, hypoxic respiratory failure ( Dr Delford Field). Acute visit. Husband here. No longer on allergy vaccine-"too much to cope with". Complains of cough x3 months with scant clear mucus. She was not improved by treatment here December 18 by NP with Levaquin, prednisone, Tussionex. Tramadol actually helps a little. She does not recognize any reflux while continuing Zantac. No wheeze or postnasal drip. CXR-06/14/2011-cardiac enlargement with no acute process.  Review of Systems  Respiratory: Positive for cough.    Constitutional:   No  weight loss, night sweats,  Fevers, chills, fatigue, or  lassitude.  HEENT:   No headaches,  Difficulty swallowing,  Tooth/dental problems, or  Sore throat,                No sneezing, itching, ear ache,  +nasal congestion, post nasal drip,   CV:  No chest pain,  Orthopnea, PND, swelling in lower extremities, anasarca, dizziness, palpitations, syncope.   GI  No heartburn, indigestion, abdominal pain, nausea, vomiting, diarrhea, change in bowel habits, loss of appetite, bloody stools.   Resp:    No coughing up of blood.     No chest wall deformity  Skin: no rash or lesions.  GU: no dysuria, change in color of urine, no urgency or frequency.  No flank pain, no hematuria   MS:  No joint pain or swelling.  No decreased range of motion.  No back pain.  Psych:  No change in mood or affect. No depression or anxiety.  No memory loss.     Objective:   Physical Exam  GEN: A/Ox3; pleasant , NAD, overweight, O2  HEENT:  [place]/AT,  EACs-clear, TMs-wnl, NOSE-clear drainage , THROAT-clear, no lesions, no postnasal drip or exudate noted.   NECK:  Supple w/ fair ROM; no JVD; normal carotid impulses w/o bruits; no thyromegaly or nodules palpated; no lymphadenopathy.  RESP  - Raspy breath sounds, loose cough, unlabored.no accessory muscle use, no dullness to percussion  CARD:  RRR, no m/r/g  , no peripheral edema, pulses  intact, no cyanosis or clubbing.  GI:   Soft & nt; nml bowel sounds; no organomegaly or masses detected.  Musco: Warm bil, no deformities or joint swelling noted.   Neuro: alert, no focal deficits noted.    Skin: Warm, no lesions or rashes

## 2011-07-17 NOTE — Patient Instructions (Signed)
Scripts sent for tramadol and benzonatate for cough-- you can use either or both, but do not take with Tussionex  Order schedule PFT  Script for diuretic furosemide to take 1 daily x 3 days only. See if this affects your cough

## 2011-07-19 NOTE — Assessment & Plan Note (Signed)
Plan- Try benzonatate or tramadol for cough, as discussed. Because of the cardiac enlargement on chest x-ray she will try a few days of Lasix. Schedule PFT.

## 2011-07-19 NOTE — Assessment & Plan Note (Signed)
She is not a good candidate for allergy vaccine but she is not able to keep up with her regular schedule. She can be managed symptomatically if spring pollen season is not too bad.

## 2011-08-01 ENCOUNTER — Telehealth: Payer: Self-pay | Admitting: Allergy

## 2011-08-01 ENCOUNTER — Other Ambulatory Visit: Payer: Self-pay | Admitting: *Deleted

## 2011-08-01 MED ORDER — ALBUTEROL SULFATE HFA 108 (90 BASE) MCG/ACT IN AERS: 2.0000 | INHALATION_SPRAY | Freq: Four times a day (QID) | RESPIRATORY_TRACT | Status: DC | PRN

## 2011-08-01 MED ORDER — FLUTICASONE-SALMETEROL 230-21 MCG/ACT IN AERO: 2.0000 | INHALATION_SPRAY | Freq: Two times a day (BID) | RESPIRATORY_TRACT | Status: DC

## 2011-08-01 NOTE — Telephone Encounter (Signed)
Refills sent. Lujuana Kapler, CMA  

## 2011-08-01 NOTE — Telephone Encounter (Signed)
[facility]  Requesting 2 refills on  Ventolin 2 puffs prn   advair 230-21 mcg 2 puffs bid  Allergies  Allergen Reactions  . Sulfadiazine     REACTION: Fever, achey   Dr Maple Hudson, are these ok to fill. Thank you

## 2011-08-03 ENCOUNTER — Ambulatory Visit: Payer: Medicare Other | Admitting: Critical Care Medicine

## 2011-08-06 ENCOUNTER — Ambulatory Visit: Payer: Medicare Other | Admitting: Internal Medicine

## 2011-08-08 ENCOUNTER — Ambulatory Visit (INDEPENDENT_AMBULATORY_CARE_PROVIDER_SITE_OTHER): Payer: Medicare Other | Admitting: Critical Care Medicine

## 2011-08-08 ENCOUNTER — Encounter: Payer: Self-pay | Admitting: Critical Care Medicine

## 2011-08-08 DIAGNOSIS — J45909 Unspecified asthma, uncomplicated: Secondary | ICD-10-CM

## 2011-08-08 MED ORDER — FUROSEMIDE 20 MG PO TABS: 20.0000 mg | ORAL_TABLET | Freq: Every day | ORAL | Status: DC

## 2011-08-08 NOTE — Patient Instructions (Signed)
No change in medications. Return in         4 months 

## 2011-08-08 NOTE — Assessment & Plan Note (Signed)
Bronchitis with recurrent airway inflammation now improved Plan Continued inhaled medications as prescribed

## 2011-08-08 NOTE — Progress Notes (Signed)
Patient ID: [name], female    DOB: 19-Aug-1930, 77 y.o.   MRN: [id number]  HPI 78 y.o. F asthmatic bronchitis   08/08/2011 Flu illness three months ago.  PFTs not done yet. Cough was severe rx pred/levaquin.  Now is better No real wheeze.  No real chest pain.  Dyspnea is better   Past Medical History  Diagnosis Date  . Osteoporosis, unspecified   . Allergic rhinitis, cause unspecified   . Other chronic pain   . Other abnormal blood chemistry   . Insomnia, unspecified   . Acute bronchitis   . Need for prophylactic hormone replacement therapy (postmenopausal)   . Degeneration of intervertebral disc, site unspecified   . Allergy, unspecified not elsewhere classified   . Myalgia and myositis, unspecified   . Uncontrolled hypertension as indication for native nephrectomy   . Other and unspecified hyperlipidemia   . Esophageal reflux   . Diverticulosis of colon (without mention of hemorrhage)   . Unspecified hypothyroidism      Family History  Problem Relation Age of Onset  . Lung cancer Brother     x2  . Hypertension Father   . Heart disease Mother   . Asthma Sister      History   Social History  . Marital Status: Married    Spouse Name: N/A    Number of Children: N/A  . Years of Education: N/A   Occupational History  . Housewife    Social History Main Topics  . Smoking status: Former Smoker -- 1.5 packs/day for 10 years    Types: Cigarettes    Quit date: 07/09/1985  . Smokeless tobacco: Never Used   Comment: 1 1/2 ppd x 20 years  . Alcohol Use: No  . Drug Use: No  . Sexually Active: Not on file   Other Topics Concern  . Not on file   Social History Narrative  . No narrative on file     Allergies  Allergen Reactions  . Sulfadiazine     REACTION: Fever, achey     Outpatient Prescriptions Prior to Visit  Medication Sig Dispense Refill  . albuterol (PROAIR HFA) 108 (90 BASE) MCG/ACT inhaler Inhale 2 puffs into the lungs every 6 (six) hours  as needed.  1 Inhaler  6  . ALPRAZolam (XANAX) 1 MG tablet Take 1/2 to 1 by mouth  twice a day as needed.  90 tablet  1  . amLODipine (NORVASC) 2.5 MG tablet Take 1 tablet (2.5 mg total) by mouth daily.  90 tablet  3  . Calcium Carbonate (CALTRATE 600 PO) Take 1 tablet by mouth daily.        . chlorpheniramine-HYDROcodone (TUSSIONEX PENNKINETIC ER) 10-8 MG/5ML LQCR Take 5 mLs by mouth every 12 (twelve) hours as needed.  140 mL  0  . Cholecalciferol (VITAMIN D3) 1000 UNITS CAPS Take 1 capsule by mouth daily.        . CYMBALTA 60 MG capsule TAKE 1 CAPSULE DAILY  90 capsule  2  . EPINEPHrine (EPIPEN JR) 0.15 MG/0.3ML injection Inject 0.3 mLs (0.15 mg total) into the muscle once as needed for anaphylaxis. / Severe allergic reaction  1 each  6  . fluticasone-salmeterol (ADVAIR HFA) 230-21 MCG/ACT inhaler Inhale 2 puffs into the lungs 2 (two) times daily.  2 Inhaler  6  . levothyroxine (SYNTHROID, LEVOTHROID) 112 MCG tablet Take 1 tablet (112 mcg total) by mouth daily.  90 tablet  3  . Multiple Vitamin (MULTIVITAMIN)  capsule Take 1 capsule by mouth daily.        Marland Kitchen oxyCODONE-acetaminophen (PERCOCET) 7.5-325 MG per tablet Take one by every 4 or 5 hours as needed. May take an extra 1/2 tab at night for pain if needed.  100 tablet  0  . potassium chloride (KLOR-CON) 10 MEQ CR tablet Take 1 tablet (10 mEq total) by mouth daily.  90 tablet  3  . ranitidine (ZANTAC) 150 MG tablet Take 150 mg by mouth 2 (two) times daily.        . SUMAtriptan (IMITREX) 50 MG tablet Take 50 mg by mouth every 2 (two) hours as needed.        . tiotropium (SPIRIVA) 18 MCG inhalation capsule Place 1 capsule (18 mcg total) into inhaler and inhale daily.  90 capsule  1  . vitamin B-12 (CYANOCOBALAMIN) 1000 MCG tablet Take 1,000 mcg by mouth daily.        Marland Kitchen zolpidem (AMBIEN) 10 MG tablet Take 1 tablet (10 mg total) by mouth at bedtime as needed for sleep.  90 tablet  1  . calcium carbonate (OS-CAL) 600 MG TABS Take 600 mg by mouth 2  (two) times daily with a meal.        . fluticasone (VERAMYST) 27.5 MCG/SPRAY nasal spray Place 2 sprays into the nose at bedtime.       . furosemide (LASIX) 20 MG tablet Take 1 tablet (20 mg total) by mouth daily. For three days  3 tablet  0  . predniSONE (DELTASONE) 10 MG tablet 4 tabs for 2 days, then 3 tabs for 2 days, 2 tabs for 2 days, then 1 tab for 2 days, then stop  20 tablet  0  . predniSONE (DELTASONE) 10 MG tablet Take 4 tablets x 2 days, 3 tablets x 2 days, 2 tablets x 2 days, 1 tablet x 2 days then stop  20 tablet  0  . pregabalin (LYRICA) 50 MG capsule Take 50 mg by mouth 2 (two) times daily.        . traMADol (ULTRAM) 50 MG tablet Take 1 tablet every 6 hrs as needed for cough  10 tablet  0  . traMADol (ULTRAM) 50 MG tablet Take 1 tablet (50 mg total) by mouth every 6 (six) hours as needed (Cough).  20 tablet  0        Review of Systems Constitutional:   No  weight loss, night sweats,  Fevers, chills, fatigue, or  lassitude.  HEENT:   No headaches,  Difficulty swallowing,  Tooth/dental problems, or  Sore throat,                No sneezing, itching, ear ache,  +nasal congestion, post nasal drip,   CV:  No chest pain,  Orthopnea, PND, swelling in lower extremities, anasarca, dizziness, palpitations, syncope.   GI  No heartburn, indigestion, abdominal pain, nausea, vomiting, diarrhea, change in bowel habits, loss of appetite, bloody stools.   Resp:    No coughing up of blood.     No chest wall deformity  Skin: no rash or lesions.  GU: no dysuria, change in color of urine, no urgency or frequency.  No flank pain, no hematuria   MS:  No joint pain or swelling.  No decreased range of motion.  No back pain.  Psych:  No change in mood or affect. No depression or anxiety.  No memory loss.     Objective:   Physical Exam BP 144/70  Pulse 76  Temp(Src) 97.9 F (36.6 C) (Oral)  Ht 5\' 2"  (1.575 m)  Wt 203 lb 3.2 oz (92.171 kg)  BMI 37.17 kg/m2  SpO2 94%   GEN: A/Ox3;  pleasant , NAD, overweight, O2  HEENT:  [place]/AT,  EACs-clear, TMs-wnl, NOSE-clear drainage , THROAT-clear, no lesions, no postnasal drip or exudate noted.   NECK:  Supple w/ fair ROM; no JVD; normal carotid impulses w/o bruits; no thyromegaly or nodules palpated; no lymphadenopathy.  RESP  - clear, distant BS no accessory muscle use, no dullness to percussion  CARD:  RRR, no m/r/g  , no peripheral edema, pulses intact, no cyanosis or clubbing.  GI:   Soft & nt; nml bowel sounds; no organomegaly or masses detected.  Musco: Warm bil, no deformities or joint swelling noted.   Neuro: alert, no focal deficits noted.    Skin: Warm, no lesions or rashes         Asthmatic bronchitis Bronchitis with recurrent airway inflammation now improved Plan Continued inhaled medications as prescribed    Updated Medication List Outpatient Encounter Prescriptions as of 08/08/2011  Medication Sig Dispense Refill  . albuterol (PROAIR HFA) 108 (90 BASE) MCG/ACT inhaler Inhale 2 puffs into the lungs every 6 (six) hours as needed.  1 Inhaler  6  . ALPRAZolam (XANAX) 1 MG tablet Take 1/2 to 1 by mouth  twice a day as needed.  90 tablet  1  . amLODipine (NORVASC) 2.5 MG tablet Take 1 tablet (2.5 mg total) by mouth daily.  90 tablet  3  . benzonatate (TESSALON) 200 MG capsule as needed.      . Calcium Carbonate (CALTRATE 600 PO) Take 1 tablet by mouth daily.        . chlorpheniramine-HYDROcodone (TUSSIONEX PENNKINETIC ER) 10-8 MG/5ML LQCR Take 5 mLs by mouth every 12 (twelve) hours as needed.  140 mL  0  . Cholecalciferol (VITAMIN D3) 1000 UNITS CAPS Take 1 capsule by mouth daily.        . CYMBALTA 60 MG capsule TAKE 1 CAPSULE DAILY  90 capsule  2  . EPINEPHrine (EPIPEN JR) 0.15 MG/0.3ML injection Inject 0.3 mLs (0.15 mg total) into the muscle once as needed for anaphylaxis. / Severe allergic reaction  1 each  6  . fluticasone-salmeterol (ADVAIR HFA) 230-21 MCG/ACT inhaler Inhale 2 puffs into the lungs 2  (two) times daily.  2 Inhaler  6  . levothyroxine (SYNTHROID, LEVOTHROID) 112 MCG tablet Take 1 tablet (112 mcg total) by mouth daily.  90 tablet  3  . Multiple Vitamin (MULTIVITAMIN) capsule Take 1 capsule by mouth daily.        Marland Kitchen oxyCODONE-acetaminophen (PERCOCET) 7.5-325 MG per tablet Take one by every 4 or 5 hours as needed. May take an extra 1/2 tab at night for pain if needed.  100 tablet  0  . potassium chloride (KLOR-CON) 10 MEQ CR tablet Take 1 tablet (10 mEq total) by mouth daily.  90 tablet  3  . ranitidine (ZANTAC) 150 MG tablet Take 150 mg by mouth 2 (two) times daily.        . SUMAtriptan (IMITREX) 50 MG tablet Take 50 mg by mouth every 2 (two) hours as needed.        . tiotropium (SPIRIVA) 18 MCG inhalation capsule Place 1 capsule (18 mcg total) into inhaler and inhale daily.  90 capsule  1  . vitamin B-12 (CYANOCOBALAMIN) 1000 MCG tablet Take 1,000 mcg by mouth daily.        Marland Kitchen  zolpidem (AMBIEN) 10 MG tablet Take 10 mg by mouth at bedtime.      . furosemide (LASIX) 20 MG tablet Take 1 tablet (20 mg total) by mouth daily. For three days  30 tablet  6  . zolpidem (AMBIEN) 10 MG tablet Take 1 tablet (10 mg total) by mouth at bedtime as needed for sleep.  90 tablet  1  . DISCONTD: calcium carbonate (OS-CAL) 600 MG TABS Take 600 mg by mouth 2 (two) times daily with a meal.        . DISCONTD: fluticasone (VERAMYST) 27.5 MCG/SPRAY nasal spray Place 2 sprays into the nose at bedtime.       Marland Kitchen DISCONTD: furosemide (LASIX) 20 MG tablet Take 1 tablet (20 mg total) by mouth daily. For three days  3 tablet  0  . DISCONTD: predniSONE (DELTASONE) 10 MG tablet 4 tabs for 2 days, then 3 tabs for 2 days, 2 tabs for 2 days, then 1 tab for 2 days, then stop  20 tablet  0  . DISCONTD: predniSONE (DELTASONE) 10 MG tablet Take 4 tablets x 2 days, 3 tablets x 2 days, 2 tablets x 2 days, 1 tablet x 2 days then stop  20 tablet  0  . DISCONTD: pregabalin (LYRICA) 50 MG capsule Take 50 mg by mouth 2 (two) times  daily.        Marland Kitchen DISCONTD: traMADol (ULTRAM) 50 MG tablet Take 1 tablet every 6 hrs as needed for cough  10 tablet  0  . DISCONTD: traMADol (ULTRAM) 50 MG tablet Take 1 tablet (50 mg total) by mouth every 6 (six) hours as needed (Cough).  20 tablet  0

## 2011-08-09 ENCOUNTER — Telehealth: Payer: Self-pay | Admitting: Family Medicine

## 2011-08-09 DIAGNOSIS — J449 Chronic obstructive pulmonary disease, unspecified: Secondary | ICD-10-CM

## 2011-08-09 NOTE — Telephone Encounter (Signed)
She's requesting a verbal order for 1 visit for the next 3 weeks for nursing.  They are seeing her for COPD and she said she has to get the order from patient's PCP and not Dr.Wright.

## 2011-08-09 NOTE — Telephone Encounter (Signed)
LMOVM

## 2011-08-09 NOTE — Telephone Encounter (Signed)
Please give verbal order for 1 visit for the next 3 weeks for nursing. Dx: COPD/Asthmatic bronchitis.  Thanks.

## 2011-08-10 ENCOUNTER — Ambulatory Visit: Payer: Medicare Other | Admitting: Family Medicine

## 2011-08-15 ENCOUNTER — Other Ambulatory Visit: Payer: Self-pay | Admitting: Family Medicine

## 2011-08-15 MED ORDER — OXYCODONE-ACETAMINOPHEN 7.5-325 MG PO TABS: ORAL_TABLET | ORAL | Status: DC

## 2011-08-15 NOTE — Telephone Encounter (Signed)
Please give to pt.  Thanks.   

## 2011-08-15 NOTE — Telephone Encounter (Signed)
Patient called and requested a refill. Call when ready for pickup.

## 2011-08-16 NOTE — Telephone Encounter (Signed)
Patient's husband advised.  Rx. Left at front desk for pick up.

## 2011-08-27 ENCOUNTER — Encounter: Payer: Self-pay | Admitting: Family Medicine

## 2011-08-27 DIAGNOSIS — M1711 Unilateral primary osteoarthritis, right knee: Secondary | ICD-10-CM | POA: Insufficient documentation

## 2011-08-27 HISTORY — DX: Unilateral primary osteoarthritis, right knee: M17.11

## 2011-09-13 ENCOUNTER — Telehealth: Payer: Self-pay | Admitting: Critical Care Medicine

## 2011-09-13 ENCOUNTER — Other Ambulatory Visit: Payer: Self-pay | Admitting: *Deleted

## 2011-09-13 NOTE — Telephone Encounter (Signed)
Patient is requesting a Rx refill, it looks like she hasn't been seen in some time.  Advised that Dr. Para March is out of the office this afternoon but will be back in the morning.

## 2011-09-13 NOTE — Telephone Encounter (Signed)
Called and spoke with pt's spouse.  Spouse states pt needs surgical clearance for TKR to be done by Dr. Dion Saucier.  Spouse aware PW out of office this week and is requesting surgical clearance asap as they want to go ahead and schedule the TKR asap.  Spouse states pt was recently see by CY as well as PW and wanted to know if CY would be willing to write the surgical clearance for pt in PW's absence.  Husband is aware pt may need to come to the office for an appt to be evaluated for surgical clearance, and he is ok with this.   CY, please advise.  Thanks!

## 2011-09-14 ENCOUNTER — Other Ambulatory Visit: Payer: Self-pay | Admitting: *Deleted

## 2011-09-14 MED ORDER — RANITIDINE HCL 150 MG PO TABS: 150.0000 mg | ORAL_TABLET | Freq: Two times a day (BID) | ORAL | Status: DC

## 2011-09-14 MED ORDER — ZOLPIDEM TARTRATE 10 MG PO TABS: 10.0000 mg | ORAL_TABLET | Freq: Every day | ORAL | Status: DC

## 2011-09-14 NOTE — Telephone Encounter (Signed)
Please call in

## 2011-09-14 NOTE — Telephone Encounter (Signed)
Medication phoned to pharmacy.  

## 2011-09-14 NOTE — Telephone Encounter (Signed)
Patient advised.

## 2011-09-14 NOTE — Telephone Encounter (Signed)
Spoke with patient-schedule 10-03-2011 for PFT.

## 2011-09-14 NOTE — Telephone Encounter (Signed)
I had asked to schedule PFT when she was here in January, but not yet done. It would help [place] a lot with her preop clearance if we could get her in quickly for that. I can do a preop note for Dr Dion Saucier, but it will be much more useful to him with recent PFT results if possible.

## 2011-09-14 NOTE — Telephone Encounter (Signed)
Send in, have her come in for 30 min visit after her surgery (when she is up and moving better).

## 2011-09-17 ENCOUNTER — Telehealth: Payer: Self-pay | Admitting: *Deleted

## 2011-09-17 MED ORDER — AZITHROMYCIN 250 MG PO TABS: ORAL_TABLET | ORAL | Status: AC

## 2011-09-17 NOTE — Telephone Encounter (Signed)
Pt has OV scheduled with PW for this Thursday, March 14 in [place] for surgery clearance but c/o HA, hoarseness, prod cough with a small amount  Of whitish tan mucus, and PND x 2-3 days.  Denies increased SOB, wheezing, chest tightness/pain, or fever.  Requesting z pak to hold her over until Thursday.  Dr. Delford Field, pls advise.  Thank you.  [facility].    Spoke with PW regarding this.  Per PW, ok for zpak # 1 take as directed no refills, keep OV on Thursday.  Pt aware rx sent to [facility] and to keep appt on Thursday with PW.  Nothing further needed at this time

## 2011-09-18 NOTE — Telephone Encounter (Signed)
I sent back to [facility] as a reminder to do Preop note for patient.

## 2011-09-18 NOTE — Telephone Encounter (Signed)
[name], is there anything else needed for this pt or can we close the message?

## 2011-09-20 ENCOUNTER — Ambulatory Visit: Payer: Medicare Other | Admitting: Critical Care Medicine

## 2011-09-25 NOTE — Telephone Encounter (Signed)
[name] is checking with Dr young to see if Pre op note will be done now or waiting for pft results.

## 2011-09-26 ENCOUNTER — Other Ambulatory Visit: Payer: Self-pay | Admitting: *Deleted

## 2011-09-26 MED ORDER — OXYCODONE-ACETAMINOPHEN 7.5-325 MG PO TABS: ORAL_TABLET | ORAL | Status: DC

## 2011-09-26 NOTE — Telephone Encounter (Signed)
Phoned request from patient, please call her when ready.

## 2011-09-26 NOTE — Telephone Encounter (Signed)
Spoke with CY-he is waiting on PFT results to send with preop note. Will place in his box as a reminder.

## 2011-09-26 NOTE — Telephone Encounter (Signed)
Please give to pt.  

## 2011-09-27 ENCOUNTER — Encounter: Payer: Self-pay | Admitting: Critical Care Medicine

## 2011-09-27 ENCOUNTER — Ambulatory Visit (INDEPENDENT_AMBULATORY_CARE_PROVIDER_SITE_OTHER): Payer: Medicare Other | Admitting: Critical Care Medicine

## 2011-09-27 VITALS — BP 126/80 | HR 71 | Temp 98.7°F | Ht 62.0 in | Wt 201.0 lb

## 2011-09-27 DIAGNOSIS — J45909 Unspecified asthma, uncomplicated: Secondary | ICD-10-CM

## 2011-09-27 MED ORDER — AZITHROMYCIN 250 MG PO TABS: 250.0000 mg | ORAL_TABLET | Freq: Every day | ORAL | Status: AC

## 2011-09-27 MED ORDER — HYDROCOD POLST-CHLORPHEN POLST 10-8 MG/5ML PO LQCR: 5.0000 mL | Freq: Two times a day (BID) | ORAL | Status: DC | PRN

## 2011-09-27 MED ORDER — PREDNISONE 10 MG PO TABS: ORAL_TABLET | ORAL | Status: DC

## 2011-09-27 NOTE — Assessment & Plan Note (Signed)
Asthmatic bronchitis with ongoing lower airway inflammation Chronic respiratory failure with oxygen dependence Need for right total knee replacement Plan Administer azithromycin Administer corticosteroid Maintain inhaled medications as prescribed Return the patient to the office in 2 weeks This patient is a poor candidate for planned surgical intervention and has not yet cleared for surgical intervention at this time

## 2011-09-27 NOTE — Patient Instructions (Signed)
USe tussionex twice a day as needed for cough Prednisone 10mg  Take 4 for three days 3 for three days 2 for three days 1 for three days and stop Azithromycin 250mg  Take two once then one daily until gone No other medication changes Return two weeks [place] office for recheck

## 2011-09-27 NOTE — Telephone Encounter (Signed)
Patient advised that rx is up front and ready for pickup.

## 2011-09-27 NOTE — Progress Notes (Signed)
Patient ID: [name], female    DOB: 1931-01-26, 78 y.o.   MRN: [id number]  HPI  78 y.o. F asthmatic bronchitis   08/08/11 Flu illness three months ago.  PFTs not done yet. Cough was severe rx pred/levaquin.  Now is better No real wheeze.  No real chest pain.  Dyspnea is better  09/27/2011 Pt desires for TKR on R.  Had arthroscopic and did not work.  Dr Dion Saucier . SInce 1/13 has done well , cough still.  Dyspnea is at baseline.  Mucus now is tan and cream color. No real chest pain    Past Medical History  Diagnosis Date  . Osteoporosis, unspecified   . Allergic rhinitis, cause unspecified   . Other chronic pain   . Other abnormal blood chemistry   . Insomnia, unspecified   . Acute bronchitis   . Need for prophylactic hormone replacement therapy (postmenopausal)   . Degeneration of intervertebral disc, site unspecified   . Allergy, unspecified not elsewhere classified   . Myalgia and myositis, unspecified   . Unspecified essential hypertension   . Other and unspecified hyperlipidemia   . Esophageal reflux   . Diverticulosis of colon (without mention of hemorrhage)   . Unspecified hypothyroidism      Family History  Problem Relation Age of Onset  . Lung cancer Brother     x2  . Hypertension Father   . Heart disease Mother   . Asthma Sister      History   Social History  . Marital Status: Married    Spouse Name: N/A    Number of Children: N/A  . Years of Education: N/A   Occupational History  . Housewife    Social History Main Topics  . Smoking status: Former Smoker -- 1.5 packs/day for 10 years    Types: Cigarettes    Quit date: 07/09/1985  . Smokeless tobacco: Never Used   Comment: 1 1/2 ppd x 20 years  . Alcohol Use: No  . Drug Use: No  . Sexually Active: Not on file   Other Topics Concern  . Not on file   Social History Narrative  . No narrative on file     Allergies  Allergen Reactions  . Sulfadiazine     REACTION: Fever, achey      Outpatient Prescriptions Prior to Visit  Medication Sig Dispense Refill  . albuterol (PROAIR HFA) 108 (90 BASE) MCG/ACT inhaler Inhale 2 puffs into the lungs every 6 (six) hours as needed.  1 Inhaler  6  . ALPRAZolam (XANAX) 1 MG tablet Take 1/2 to 1 by mouth  twice a day as needed.  90 tablet  1  . amLODipine (NORVASC) 2.5 MG tablet Take 1 tablet (2.5 mg total) by mouth daily.  90 tablet  3  . Calcium Carbonate (CALTRATE 600 PO) Take 1 tablet by mouth daily.        . Cholecalciferol (VITAMIN D3) 1000 UNITS CAPS Take 1 capsule by mouth daily.        . CYMBALTA 60 MG capsule TAKE 1 CAPSULE DAILY  90 capsule  2  . EPINEPHrine (EPIPEN JR) 0.15 MG/0.3ML injection Inject 0.3 mLs (0.15 mg total) into the muscle once as needed for anaphylaxis. / Severe allergic reaction  1 each  6  . fluticasone-salmeterol (ADVAIR HFA) 230-21 MCG/ACT inhaler Inhale 2 puffs into the lungs 2 (two) times daily.  2 Inhaler  6  . furosemide (LASIX) 20 MG tablet Take 1 tablet (  20 mg total) by mouth daily. For three days  30 tablet  6  . levothyroxine (SYNTHROID, LEVOTHROID) 112 MCG tablet Take 1 tablet (112 mcg total) by mouth daily.  90 tablet  3  . Multiple Vitamin (MULTIVITAMIN) capsule Take 1 capsule by mouth daily.        Marland Kitchen oxyCODONE-acetaminophen (PERCOCET) 7.5-325 MG per tablet Take one by every 4 or 5 hours as needed. May take an extra 1/2 tab at night for pain if needed.  100 tablet  0  . potassium chloride (KLOR-CON) 10 MEQ CR tablet Take 1 tablet (10 mEq total) by mouth daily.  90 tablet  3  . ranitidine (ZANTAC) 150 MG tablet Take 1 tablet (150 mg total) by mouth 2 (two) times daily.  180 tablet  3  . SUMAtriptan (IMITREX) 50 MG tablet Take 50 mg by mouth every 2 (two) hours as needed.        . tiotropium (SPIRIVA) 18 MCG inhalation capsule Place 1 capsule (18 mcg total) into inhaler and inhale daily.  90 capsule  1  . vitamin B-12 (CYANOCOBALAMIN) 1000 MCG tablet Take 1,000 mcg by mouth daily.        Marland Kitchen  zolpidem (AMBIEN) 10 MG tablet Take 1 tablet (10 mg total) by mouth at bedtime.  30 tablet  5  . zolpidem (AMBIEN) 10 MG tablet Take 1 tablet (10 mg total) by mouth at bedtime as needed for sleep.  90 tablet  1  . benzonatate (TESSALON) 200 MG capsule as needed.      . chlorpheniramine-HYDROcodone (TUSSIONEX PENNKINETIC ER) 10-8 MG/5ML LQCR Take 5 mLs by mouth every 12 (twelve) hours as needed.  140 mL  0        Review of Systems  Constitutional:   No  weight loss, night sweats,  Fevers, chills, fatigue, or  lassitude.  HEENT:   No headaches,  Difficulty swallowing,  Tooth/dental problems, or  Sore throat,                No sneezing, itching, ear ache,  +nasal congestion, post nasal drip,   CV:  No chest pain,  Orthopnea, PND, swelling in lower extremities, anasarca, dizziness, palpitations, syncope.   GI  No heartburn, indigestion, abdominal pain, nausea, vomiting, diarrhea, change in bowel habits, loss of appetite, bloody stools.   Resp:    No coughing up of blood.     No chest wall deformity  Skin: no rash or lesions.  GU: no dysuria, change in color of urine, no urgency or frequency.  No flank pain, no hematuria   MS:  No joint pain or swelling.  No decreased range of motion.  No back pain.  Psych:  No change in mood or affect. No depression or anxiety.  No memory loss.     Objective:   Physical Exam BP 126/80  Pulse 71  Temp(Src) 98.7 F (37.1 C) (Oral)  Ht 5\' 2"  (1.575 m)  Wt 201 lb (91.173 kg)  BMI 36.76 kg/m2  SpO2 94%   GEN: A/Ox3; pleasant , NAD, overweight, O2  HEENT:  [place]/AT,  EACs-clear, TMs-wnl, NOSE-clear drainage , THROAT-clear, no lesions, no postnasal drip or exudate noted.   NECK:  Supple w/ fair ROM; no JVD; normal carotid impulses w/o bruits; no thyromegaly or nodules palpated; no lymphadenopathy.  RESP  - inspiratory and expiratory wheezes with poor airflow  CARD:  RRR, no m/r/g  , no peripheral edema, pulses intact, no cyanosis or  clubbing.  GI:  Soft & nt; nml bowel sounds; no organomegaly or masses detected.  Musco: Warm bil, no deformities or joint swelling noted.   Neuro: alert, no focal deficits noted.    Skin: Warm, no lesions or rashes         Asthmatic bronchitis Asthmatic bronchitis with ongoing lower airway inflammation Chronic respiratory failure with oxygen dependence Need for right total knee replacement Plan Administer azithromycin Administer corticosteroid Maintain inhaled medications as prescribed Return the patient to the office in 2 weeks This patient is a poor candidate for planned surgical intervention and has not yet cleared for surgical intervention at this time     Updated Medication List Outpatient Encounter Prescriptions as of 09/27/2011  Medication Sig Dispense Refill  . albuterol (PROAIR HFA) 108 (90 BASE) MCG/ACT inhaler Inhale 2 puffs into the lungs every 6 (six) hours as needed.  1 Inhaler  6  . ALPRAZolam (XANAX) 1 MG tablet Take 1/2 to 1 by mouth  twice a day as needed.  90 tablet  1  . amLODipine (NORVASC) 2.5 MG tablet Take 1 tablet (2.5 mg total) by mouth daily.  90 tablet  3  . Calcium Carbonate (CALTRATE 600 PO) Take 1 tablet by mouth daily.        . Cholecalciferol (VITAMIN D3) 1000 UNITS CAPS Take 1 capsule by mouth daily.        . CYMBALTA 60 MG capsule TAKE 1 CAPSULE DAILY  90 capsule  2  . EPINEPHrine (EPIPEN JR) 0.15 MG/0.3ML injection Inject 0.3 mLs (0.15 mg total) into the muscle once as needed for anaphylaxis. / Severe allergic reaction  1 each  6  . fluticasone-salmeterol (ADVAIR HFA) 230-21 MCG/ACT inhaler Inhale 2 puffs into the lungs 2 (two) times daily.  2 Inhaler  6  . furosemide (LASIX) 20 MG tablet Take 1 tablet (20 mg total) by mouth daily. For three days  30 tablet  6  . levothyroxine (SYNTHROID, LEVOTHROID) 112 MCG tablet Take 1 tablet (112 mcg total) by mouth daily.  90 tablet  3  . Multiple Vitamin (MULTIVITAMIN) capsule Take 1 capsule by  mouth daily.        Marland Kitchen oxyCODONE-acetaminophen (PERCOCET) 7.5-325 MG per tablet Take one by every 4 or 5 hours as needed. May take an extra 1/2 tab at night for pain if needed.  100 tablet  0  . potassium chloride (KLOR-CON) 10 MEQ CR tablet Take 1 tablet (10 mEq total) by mouth daily.  90 tablet  3  . ranitidine (ZANTAC) 150 MG tablet Take 1 tablet (150 mg total) by mouth 2 (two) times daily.  180 tablet  3  . SUMAtriptan (IMITREX) 50 MG tablet Take 50 mg by mouth every 2 (two) hours as needed.        . tiotropium (SPIRIVA) 18 MCG inhalation capsule Place 1 capsule (18 mcg total) into inhaler and inhale daily.  90 capsule  1  . vitamin B-12 (CYANOCOBALAMIN) 1000 MCG tablet Take 1,000 mcg by mouth daily.        Marland Kitchen zolpidem (AMBIEN) 10 MG tablet Take 1 tablet (10 mg total) by mouth at bedtime.  30 tablet  5  . DISCONTD: zolpidem (AMBIEN) 10 MG tablet Take 1 tablet (10 mg total) by mouth at bedtime as needed for sleep.  90 tablet  1  . azithromycin (ZITHROMAX) 250 MG tablet Take 1 tablet (250 mg total) by mouth daily. Take two once then one daily until gone  6 each  0  .  chlorpheniramine-HYDROcodone (TUSSIONEX PENNKINETIC ER) 10-8 MG/5ML LQCR Take 5 mLs by mouth every 12 (twelve) hours as needed.  140 mL  0  . predniSONE (DELTASONE) 10 MG tablet Take 4 for three days 3 for three days 2 for three days 1 for three days and stop  30 tablet  0  . DISCONTD: benzonatate (TESSALON) 200 MG capsule as needed.      Marland Kitchen DISCONTD: chlorpheniramine-HYDROcodone (TUSSIONEX PENNKINETIC ER) 10-8 MG/5ML LQCR Take 5 mLs by mouth every 12 (twelve) hours as needed.  140 mL  0

## 2011-10-01 ENCOUNTER — Other Ambulatory Visit: Payer: Self-pay | Admitting: *Deleted

## 2011-10-01 MED ORDER — ALPRAZOLAM 1 MG PO TABS: ORAL_TABLET | ORAL | Status: DC

## 2011-10-01 NOTE — Telephone Encounter (Signed)
Form filled out   Please fax in

## 2011-10-01 NOTE — Telephone Encounter (Signed)
Received faxed refill request from pharmacy for Alprazolam. Form is in your in box. Is it okay to refill medication?

## 2011-10-02 ENCOUNTER — Ambulatory Visit: Payer: Medicare Other | Admitting: Critical Care Medicine

## 2011-10-02 NOTE — Telephone Encounter (Signed)
Faxed

## 2011-10-02 NOTE — Telephone Encounter (Signed)
Pt was seen by PEW for surgery clearance on 3.21.13.  Will sign off on message.

## 2011-10-03 ENCOUNTER — Telehealth: Payer: Self-pay | Admitting: Critical Care Medicine

## 2011-10-03 NOTE — Telephone Encounter (Signed)
Pt needs an ov with cxr prior to visit.

## 2011-10-03 NOTE — Telephone Encounter (Signed)
Called and spoke with pt.  Informed her of KC's response/recs.  Pt refused to make an appt and requested this message be forwarded on to PW who is in [place] tomorrow for his recommendations.  PW, please advise.  Thanks!

## 2011-10-03 NOTE — Telephone Encounter (Signed)
PW not in the office, not on the schedule today so will forward to [facility] as doc of the day. Please advise, thanks!

## 2011-10-03 NOTE — Telephone Encounter (Signed)
Patient returning call.

## 2011-10-03 NOTE — Telephone Encounter (Signed)
I spoke with the pt husband and he states that the pt finished zpak yesterday, and is currently on day 7 of 12 day pred taper and he states she is not feeling any improvement. He states she is coughing constantly and at times it is productive with white phlegm. She is still having increased SOB with minimal exertion as well. Pt denies any chest tightness or fever.  He states Dr. Delford Field advised once she was finished with abx if she did not have any improvement to call us. Please advise. Carron Curie, CMA  Allergies  Allergen Reactions  . Sulfadiazine     REACTION: Fever, achey

## 2011-10-04 MED ORDER — MOXIFLOXACIN HCL 400 MG PO TABS: 400.0000 mg | ORAL_TABLET | Freq: Every day | ORAL | Status: DC

## 2011-10-04 NOTE — Telephone Encounter (Signed)
Pt's spouse aware and Avelox has been sent to [facility]. Pt will f/u with Dr. Delford Field on 10/10/11 or call sooner if sxs do not improve.

## 2011-10-04 NOTE — Telephone Encounter (Signed)
Call in avelox 400mg  daily x 5 days

## 2011-10-10 ENCOUNTER — Ambulatory Visit (INDEPENDENT_AMBULATORY_CARE_PROVIDER_SITE_OTHER)
Admission: RE | Admit: 2011-10-10 | Discharge: 2011-10-10 | Disposition: A | Discharge status: Home / Self Care | Payer: Medicare Other | Source: Ambulatory Visit | Attending: Critical Care Medicine | Admitting: Critical Care Medicine

## 2011-10-10 ENCOUNTER — Encounter: Payer: Self-pay | Admitting: Critical Care Medicine

## 2011-10-10 ENCOUNTER — Ambulatory Visit (INDEPENDENT_AMBULATORY_CARE_PROVIDER_SITE_OTHER): Payer: Medicare Other | Admitting: Critical Care Medicine

## 2011-10-10 ENCOUNTER — Ambulatory Visit: Payer: Medicare Other | Admitting: Internal Medicine

## 2011-10-10 VITALS — BP 110/64 | HR 79 | Temp 98.1°F | Ht 61.0 in

## 2011-10-10 DIAGNOSIS — J45909 Unspecified asthma, uncomplicated: Secondary | ICD-10-CM

## 2011-10-10 MED ORDER — PREDNISONE 10 MG PO TABS: ORAL_TABLET | ORAL | Status: DC

## 2011-10-10 MED ORDER — MOXIFLOXACIN HCL 400 MG PO TABS: 400.0000 mg | ORAL_TABLET | Freq: Every day | ORAL | Status: AC

## 2011-10-10 MED ORDER — ACLIDINIUM BROMIDE 400 MCG/ACT IN AEPB: 1.0000 | INHALATION_SPRAY | Freq: Two times a day (BID) | RESPIRATORY_TRACT | Status: DC

## 2011-10-10 NOTE — Progress Notes (Signed)
Patient ID: [name], female    DOB: July 17, 1930, 78 y.o.   MRN: [id number]  HPI  78 y.o. F asthmatic bronchitis   08/08/11 Flu illness three months ago.  PFTs not done yet. Cough was severe rx pred/levaquin.  Now is better No real wheeze.  No real chest pain.  Dyspnea is better  3/21 Pt desires for TKR on R.  Had arthroscopic and did not work.  Dr Dion Saucier . SInce 1/13 has done well , cough still.  Dyspnea is at baseline.  Mucus now is tan and cream color. No real chest pain     10/10/2011 At last ov we Rx azithromycin/pred pulse, this did not help. Then Rx avelox x 5days on 3/27 Unsure if any better.  Still coughing , yellow to cream .  Pt notes dyspnea with cough Prednisone helped some   Past Medical History  Diagnosis Date  . Osteoporosis, unspecified   . Allergic rhinitis, cause unspecified   . Other chronic pain   . Other abnormal blood chemistry   . Insomnia, unspecified   . Acute bronchitis   . Need for prophylactic hormone replacement therapy (postmenopausal)   . Degeneration of intervertebral disc, site unspecified   . Allergy, unspecified not elsewhere classified   . Myalgia and myositis, unspecified   . Unspecified essential hypertension   . Other and unspecified hyperlipidemia   . Esophageal reflux   . Diverticulosis of colon (without mention of hemorrhage)   . Unspecified hypothyroidism      Family History  Problem Relation Age of Onset  . Lung cancer Brother     x2  . Hypertension Father   . Heart disease Mother   . Asthma Sister      History   Social History  . Marital Status: Married    Spouse Name: N/A    Number of Children: N/A  . Years of Education: N/A   Occupational History  . Housewife    Social History Main Topics  . Smoking status: Former Smoker -- 1.5 packs/day for 10 years    Types: Cigarettes    Quit date: 07/09/1985  . Smokeless tobacco: Never Used   Comment: 1 1/2 ppd x 20 years  . Alcohol Use: No  . Drug Use: No  .  Sexually Active: Not on file   Other Topics Concern  . Not on file   Social History Narrative  . No narrative on file     Allergies  Allergen Reactions  . Sulfadiazine     REACTION: Fever, achey     Outpatient Prescriptions Prior to Visit  Medication Sig Dispense Refill  . albuterol (PROAIR HFA) 108 (90 BASE) MCG/ACT inhaler Inhale 2 puffs into the lungs every 6 (six) hours as needed.  1 Inhaler  6  . ALPRAZolam (XANAX) 1 MG tablet Take 1/2 to 1 by mouth  twice a day as needed.  90 tablet  1  . amLODipine (NORVASC) 2.5 MG tablet Take 1 tablet (2.5 mg total) by mouth daily.  90 tablet  3  . Calcium Carbonate (CALTRATE 600 PO) Take 1 tablet by mouth daily.        . Cholecalciferol (VITAMIN D3) 1000 UNITS CAPS Take 1 capsule by mouth daily.        . CYMBALTA 60 MG capsule TAKE 1 CAPSULE DAILY  90 capsule  2  . EPINEPHrine (EPIPEN JR) 0.15 MG/0.3ML injection Inject 0.3 mLs (0.15 mg total) into the muscle once as needed for  anaphylaxis. / Severe allergic reaction  1 each  6  . fluticasone-salmeterol (ADVAIR HFA) 230-21 MCG/ACT inhaler Inhale 2 puffs into the lungs 2 (two) times daily.  2 Inhaler  6  . furosemide (LASIX) 20 MG tablet Take 1 tablet (20 mg total) by mouth daily. For three days  30 tablet  6  . levothyroxine (SYNTHROID, LEVOTHROID) 112 MCG tablet Take 1 tablet (112 mcg total) by mouth daily.  90 tablet  3  . Multiple Vitamin (MULTIVITAMIN) capsule Take 1 capsule by mouth daily.        Marland Kitchen oxyCODONE-acetaminophen (PERCOCET) 7.5-325 MG per tablet Take one by every 4 or 5 hours as needed. May take an extra 1/2 tab at night for pain if needed.  100 tablet  0  . potassium chloride (KLOR-CON) 10 MEQ CR tablet Take 1 tablet (10 mEq total) by mouth daily.  90 tablet  3  . ranitidine (ZANTAC) 150 MG tablet Take 1 tablet (150 mg total) by mouth 2 (two) times daily.  180 tablet  3  . SUMAtriptan (IMITREX) 50 MG tablet Take 50 mg by mouth every 2 (two) hours as needed.        . vitamin B-12  (CYANOCOBALAMIN) 1000 MCG tablet Take 1,000 mcg by mouth daily.        Marland Kitchen zolpidem (AMBIEN) 10 MG tablet Take 1 tablet (10 mg total) by mouth at bedtime.  30 tablet  5  . tiotropium (SPIRIVA) 18 MCG inhalation capsule Place 1 capsule (18 mcg total) into inhaler and inhale daily.  90 capsule  1  . chlorpheniramine-HYDROcodone (TUSSIONEX PENNKINETIC ER) 10-8 MG/5ML LQCR Take 5 mLs by mouth every 12 (twelve) hours as needed.  140 mL  0  . moxifloxacin (AVELOX) 400 MG tablet Take 1 tablet (400 mg total) by mouth daily.  5 tablet  0  . predniSONE (DELTASONE) 10 MG tablet Take 4 for three days 3 for three days 2 for three days 1 for three days and stop  30 tablet  0        Review of Systems  Constitutional:   No  weight loss, night sweats,  Fevers, chills, fatigue, or  lassitude.  HEENT:   No headaches,  Difficulty swallowing,  Tooth/dental problems, or  Sore throat,                No sneezing, itching, ear ache,  +nasal congestion, post nasal drip,   CV:  No chest pain,  Orthopnea, PND, swelling in lower extremities, anasarca, dizziness, palpitations, syncope.   GI  No heartburn, indigestion, abdominal pain, nausea, vomiting, diarrhea, change in bowel habits, loss of appetite, bloody stools.   Resp:    No coughing up of blood.     No chest wall deformity  Skin: no rash or lesions.  GU: no dysuria, change in color of urine, no urgency or frequency.  No flank pain, no hematuria   MS:  No joint pain or swelling.  No decreased range of motion.  No back pain.  Psych:  No change in mood or affect. No depression or anxiety.  No memory loss.     Objective:   Physical Exam BP 110/64  Pulse 79  Temp(Src) 98.1 F (36.7 C) (Oral)  Ht 5\' 1"  (1.549 m)  SpO2 95%   GEN: A/Ox3; pleasant , NAD, overweight, O2  HEENT:  [place]/AT,  EACs-clear, TMs-wnl, NOSE-clear drainage , THROAT-clear, no lesions, no postnasal drip or exudate noted.   NECK:  Supple w/ fair  ROM; no JVD; normal carotid impulses  w/o bruits; no thyromegaly or nodules palpated; no lymphadenopathy.  RESP  -coarse rhonchi in right upper lung zones with poor breath  CARD:  RRR, no m/r/g  , no peripheral edema, pulses intact, no cyanosis or clubbing.  GI:   Soft & nt; nml bowel sounds; no organomegaly or masses detected.  Musco: Warm bil, no deformities or joint swelling noted.   Neuro: alert, no focal deficits noted.    Skin: Warm, no lesions or rashes  Dg Chest 2 View  10/10/2011  *RADIOLOGY REPORT*  Clinical Data: Shortness of breath, asthma  CHEST - 2 VIEW  Comparison: Chest x-ray of 06/14/2011  Findings: The lungs are clear but hyperaerated.  Basilar linear scarring or atelectasis is noted.  Mild cardiomegaly is stable. There is a mild thoracic scoliosis present.  A prosthetic right humeral head is noted.  IMPRESSION: Hyperaeration.  Mild cardiomegaly.  No active lung disease.  Original Report Authenticated By: Juline Patch, M.D.          Asthmatic bronchitis Asthmatic bronchitis with ongoing lower airway inflammation Chronic respiratory failure with oxygen dependence Need for right total knee replacement Plan Start trial of Tudorza one puff twice daily Stop Spiriva Prednisone cycle again 10mg  Take 4 for three days 3 for three days 2 for three days 1 for three days and stop Extend Avelox 5 days more Chest Xray today Return 1 month  This patient continues to be a poor candidate for total knee replacement however we will obtain a chest x-ray today and adjust patient's pulmonary program and return in one month to see if the patient may have clearance for orthopedics procedure    Addendum: Note the patient's chest x-ray today does not show active disease process Updated Medication List Outpatient Encounter Prescriptions as of 10/10/2011  Medication Sig Dispense Refill  . albuterol (PROAIR HFA) 108 (90 BASE) MCG/ACT inhaler Inhale 2 puffs into the lungs every 6 (six) hours as needed.  1 Inhaler  6  .  ALPRAZolam (XANAX) 1 MG tablet Take 1/2 to 1 by mouth  twice a day as needed.  90 tablet  1  . amLODipine (NORVASC) 2.5 MG tablet Take 1 tablet (2.5 mg total) by mouth daily.  90 tablet  3  . Calcium Carbonate (CALTRATE 600 PO) Take 1 tablet by mouth daily.        . Cholecalciferol (VITAMIN D3) 1000 UNITS CAPS Take 1 capsule by mouth daily.        . CYMBALTA 60 MG capsule TAKE 1 CAPSULE DAILY  90 capsule  2  . EPINEPHrine (EPIPEN JR) 0.15 MG/0.3ML injection Inject 0.3 mLs (0.15 mg total) into the muscle once as needed for anaphylaxis. / Severe allergic reaction  1 each  6  . fluticasone-salmeterol (ADVAIR HFA) 230-21 MCG/ACT inhaler Inhale 2 puffs into the lungs 2 (two) times daily.  2 Inhaler  6  . furosemide (LASIX) 20 MG tablet Take 1 tablet (20 mg total) by mouth daily. For three days  30 tablet  6  . levothyroxine (SYNTHROID, LEVOTHROID) 112 MCG tablet Take 1 tablet (112 mcg total) by mouth daily.  90 tablet  3  . Multiple Vitamin (MULTIVITAMIN) capsule Take 1 capsule by mouth daily.        Marland Kitchen oxyCODONE-acetaminophen (PERCOCET) 7.5-325 MG per tablet Take one by every 4 or 5 hours as needed. May take an extra 1/2 tab at night for pain if needed.  100 tablet  0  .  potassium chloride (KLOR-CON) 10 MEQ CR tablet Take 1 tablet (10 mEq total) by mouth daily.  90 tablet  3  . ranitidine (ZANTAC) 150 MG tablet Take 1 tablet (150 mg total) by mouth 2 (two) times daily.  180 tablet  3  . SUMAtriptan (IMITREX) 50 MG tablet Take 50 mg by mouth every 2 (two) hours as needed.        . vitamin B-12 (CYANOCOBALAMIN) 1000 MCG tablet Take 1,000 mcg by mouth daily.        Marland Kitchen zolpidem (AMBIEN) 10 MG tablet Take 1 tablet (10 mg total) by mouth at bedtime.  30 tablet  5  . DISCONTD: tiotropium (SPIRIVA) 18 MCG inhalation capsule Place 1 capsule (18 mcg total) into inhaler and inhale daily.  90 capsule  1  . Aclidinium Bromide (TUDORZA PRESSAIR) 400 MCG/ACT AEPB Inhale 1 puff into the lungs 2 (two) times daily.  1 each   0  . moxifloxacin (AVELOX) 400 MG tablet Take 1 tablet (400 mg total) by mouth daily.  5 tablet  0  . predniSONE (DELTASONE) 10 MG tablet Take 4 for three days 3 for three days 2 for three days 1 for three days and stop  30 tablet  0  . DISCONTD: chlorpheniramine-HYDROcodone (TUSSIONEX PENNKINETIC ER) 10-8 MG/5ML LQCR Take 5 mLs by mouth every 12 (twelve) hours as needed.  140 mL  0  . DISCONTD: moxifloxacin (AVELOX) 400 MG tablet Take 1 tablet (400 mg total) by mouth daily.  5 tablet  0  . DISCONTD: predniSONE (DELTASONE) 10 MG tablet Take 4 for three days 3 for three days 2 for three days 1 for three days and stop  30 tablet  0

## 2011-10-10 NOTE — Patient Instructions (Addendum)
Start trial of Tudorza one puff twice daily Stop Spiriva Prednisone cycle again 10mg  Take 4 for three days 3 for three days 2 for three days 1 for three days and stop Extend Avelox 5 days more Chest Xray today Return 1 month

## 2011-10-10 NOTE — Assessment & Plan Note (Addendum)
Asthmatic bronchitis with ongoing lower airway inflammation Chronic respiratory failure with oxygen dependence Need for right total knee replacement Plan Start trial of Tudorza one puff twice daily Stop Spiriva Prednisone cycle again 10mg  Take 4 for three days 3 for three days 2 for three days 1 for three days and stop Extend Avelox 5 days more Chest Xray today Return 1 month  This patient continues to be a poor candidate for total knee replacement however we will obtain a chest x-ray today and adjust patient's pulmonary program and return in one month to see if the patient may have clearance for orthopedics procedure

## 2011-10-11 NOTE — Progress Notes (Signed)
Quick Note:  Called pt's home number - line busy x 3.  Called pt's cell # - [email or web address] ______

## 2011-10-11 NOTE — Progress Notes (Signed)
Quick Note:  Notify the patient that the Xray is stable and no pneumonia No change in medications are recommended. Continue current meds as prescribed at last office visit ______ 

## 2011-10-11 NOTE — Progress Notes (Signed)
Quick Note:  Called pt's home # - line busy x 3  Called pt's cell # - [email or web address] ______

## 2011-10-11 NOTE — Progress Notes (Signed)
Quick Note:  Pt's husband, Ebony Cargo, returned call. I advised him of xray results and recs per Dr. Delford Field. He verbalized understanding of this and will have pt call back if she has any further questions. ______

## 2011-10-17 ENCOUNTER — Telehealth: Payer: Self-pay | Admitting: Critical Care Medicine

## 2011-10-17 NOTE — Telephone Encounter (Signed)
Pt's husband returned triage's call.  [name]

## 2011-10-17 NOTE — Telephone Encounter (Signed)
Spoke with pt's spouse. He states that the pt is finished avelox on 10/15/11 and her cough is unchanged. He states cough is prod with minimal sputum- cream to green in color. He states that her breathing is doing fine and there has been no CP, wheezing, fever or other co's. I advised that PW out of the office until 10/22/11 and if pt not improving may need ov. Offered ov with TP for tomorrow and he refused, stating that he "just though Dr Delford Field would want to know" and he is also asking if PW would like her to have another round of avelox. He states does not want msg sent to any provider besides PW and is okay with waiting until his return for response. Please advise thanks!

## 2011-10-17 NOTE — Telephone Encounter (Signed)
ATC x 2 line busy. WCB.  

## 2011-10-18 NOTE — Telephone Encounter (Signed)
No further abx Use sugar free lozenges Reflux diet mucinex DM bid otc delsym prn for cough

## 2011-10-18 NOTE — Telephone Encounter (Signed)
Spoke with the pt's spouse and notified of the recs per PW. He verbalized understanding. I verified the address and mailed him copy of the GERD diet. He is to call back if pt not improving or gets worse.

## 2011-11-05 ENCOUNTER — Telehealth: Payer: Self-pay | Admitting: Critical Care Medicine

## 2011-11-05 NOTE — Telephone Encounter (Signed)
Received copies from [employer] ,on 11/05/11. Forwarded 2 pages to Dr.Wright ,for review.

## 2011-11-13 ENCOUNTER — Other Ambulatory Visit: Payer: Self-pay

## 2011-11-13 MED ORDER — OXYCODONE-ACETAMINOPHEN 7.5-325 MG PO TABS: ORAL_TABLET | ORAL | Status: DC

## 2011-11-13 NOTE — Telephone Encounter (Signed)
Patient advised.   Rx left at front desk for pick up.  Advised to schedule 30 min OV.

## 2011-11-13 NOTE — Telephone Encounter (Signed)
Pt request written rx for Percocet 7.5-325 mg. Pt last seen 04/02/11. When rx ready for pick up call [phone] or [phone].pt request pick up ASAP.

## 2011-11-13 NOTE — Telephone Encounter (Signed)
Please give to pt.  Needs routine OV ( ) to discuss, check.  Thanks.

## 2011-11-15 ENCOUNTER — Ambulatory Visit: Payer: Medicare Other | Admitting: Internal Medicine

## 2011-11-16 ENCOUNTER — Ambulatory Visit (INDEPENDENT_AMBULATORY_CARE_PROVIDER_SITE_OTHER): Payer: Medicare Other | Admitting: Critical Care Medicine

## 2011-11-16 ENCOUNTER — Encounter: Payer: Self-pay | Admitting: Critical Care Medicine

## 2011-11-16 VITALS — BP 132/74 | HR 84 | Temp 98.4°F | Ht 61.0 in | Wt 207.0 lb

## 2011-11-16 DIAGNOSIS — J45909 Unspecified asthma, uncomplicated: Secondary | ICD-10-CM

## 2011-11-16 MED ORDER — TIOTROPIUM BROMIDE MONOHYDRATE 18 MCG IN CAPS: 18.0000 ug | ORAL_CAPSULE | Freq: Every day | RESPIRATORY_TRACT | Status: DC

## 2011-11-16 NOTE — Assessment & Plan Note (Signed)
Asthmatic bronchitis with recent exacerbation now improving  Plan Patient may now be cleared for planned orthopedic surgery with total knee replacement Pulmonary will need to be notified when the patient is to be admitted for this procedure No change in current pulmonary medications are recommended

## 2011-11-16 NOTE — Progress Notes (Signed)
Patient ID: [name], female    DOB: 1931/01/22, 78 y.o.   MRN: [id number]  HPI  78 y.o. F asthmatic bronchitis  11/16/2011 Spiriva works better.  Tudorza not as good.    Cough is gone.  Dyspnea is the same.   Still wants TKR. Pt denies any significant sore throat, nasal congestion or excess secretions, fever, chills, sweats, unintended weight loss, pleurtic or exertional chest pain, orthopnea PND, or leg swelling Pt denies any increase in rescue therapy over baseline, denies waking up needing it or having any early am or nocturnal exacerbations of coughing/wheezing/or dyspnea. Pt also denies any obvious fluctuation in symptoms with  weather or environmental change or other alleviating or aggravating factors     Past Medical History  Diagnosis Date  . Osteoporosis, unspecified   . Allergic rhinitis, cause unspecified   . Other chronic pain   . Other abnormal blood chemistry   . Insomnia, unspecified   . Acute bronchitis   . Need for prophylactic hormone replacement therapy (postmenopausal)   . Degeneration of intervertebral disc, site unspecified   . Allergy, unspecified not elsewhere classified   . Myalgia and myositis, unspecified   . Unspecified essential hypertension   . Other and unspecified hyperlipidemia   . Esophageal reflux   . Diverticulosis of colon (without mention of hemorrhage)   . Unspecified hypothyroidism      Family History  Problem Relation Age of Onset  . Lung cancer Brother     x2  . Hypertension Father   . Heart disease Mother   . Asthma Sister      History   Social History  . Marital Status: Married    Spouse Name: N/A    Number of Children: N/A  . Years of Education: N/A   Occupational History  . Housewife    Social History Main Topics  . Smoking status: Former Smoker -- 1.5 packs/day for 10 years    Types: Cigarettes    Quit date: 07/09/1985  . Smokeless tobacco: Never Used   Comment: 1 1/2 ppd x 20 years  . Alcohol Use: No   . Drug Use: No  . Sexually Active: Not on file   Other Topics Concern  . Not on file   Social History Narrative  . No narrative on file     Allergies  Allergen Reactions  . Sulfadiazine     REACTION: Fever, achey     Outpatient Prescriptions Prior to Visit  Medication Sig Dispense Refill  . albuterol (PROAIR HFA) 108 (90 BASE) MCG/ACT inhaler Inhale 2 puffs into the lungs every 6 (six) hours as needed.  1 Inhaler  6  . ALPRAZolam (XANAX) 1 MG tablet Take 1/2 to 1 by mouth  twice a day as needed.  90 tablet  1  . amLODipine (NORVASC) 2.5 MG tablet Take 1 tablet (2.5 mg total) by mouth daily.  90 tablet  3  . Calcium Carbonate (CALTRATE 600 PO) Take 1 tablet by mouth daily.        . Cholecalciferol (VITAMIN D3) 1000 UNITS CAPS Take 1 capsule by mouth daily.        . CYMBALTA 60 MG capsule TAKE 1 CAPSULE DAILY  90 capsule  2  . EPINEPHrine (EPIPEN JR) 0.15 MG/0.3ML injection Inject 0.3 mLs (0.15 mg total) into the muscle once as needed for anaphylaxis. / Severe allergic reaction  1 each  6  . fluticasone-salmeterol (ADVAIR HFA) 230-21 MCG/ACT inhaler Inhale 2 puffs into  the lungs 2 (two) times daily.  2 Inhaler  6  . levothyroxine (SYNTHROID, LEVOTHROID) 112 MCG tablet Take 1 tablet (112 mcg total) by mouth daily.  90 tablet  3  . Multiple Vitamin (MULTIVITAMIN) capsule Take 1 capsule by mouth daily.        Marland Kitchen oxyCODONE-acetaminophen (PERCOCET) 7.5-325 MG per tablet Take one by every 4 or 5 hours as needed. May take an extra 1/2 tab at night for pain if needed.  100 tablet  0  . potassium chloride (KLOR-CON) 10 MEQ CR tablet Take 1 tablet (10 mEq total) by mouth daily.  90 tablet  3  . ranitidine (ZANTAC) 150 MG tablet Take 1 tablet (150 mg total) by mouth 2 (two) times daily.  180 tablet  3  . SUMAtriptan (IMITREX) 50 MG tablet Take 50 mg by mouth every 2 (two) hours as needed.        . vitamin B-12 (CYANOCOBALAMIN) 1000 MCG tablet Take 1,000 mcg by mouth daily.        Marland Kitchen zolpidem  (AMBIEN) 10 MG tablet Take 1 tablet (10 mg total) by mouth at bedtime.  30 tablet  5  . Aclidinium Bromide (TUDORZA PRESSAIR) 400 MCG/ACT AEPB Inhale 1 puff into the lungs 2 (two) times daily.  1 each  0  . furosemide (LASIX) 20 MG tablet Take 1 tablet (20 mg total) by mouth daily. For three days  30 tablet  6  . predniSONE (DELTASONE) 10 MG tablet Take 4 for three days 3 for three days 2 for three days 1 for three days and stop  30 tablet  0        Review of Systems  Constitutional:   No  weight loss, night sweats,  Fevers, chills, fatigue, or  lassitude.  HEENT:   No headaches,  Difficulty swallowing,  Tooth/dental problems, or  Sore throat,                No sneezing, itching, ear ache,  +nasal congestion, post nasal drip,   CV:  No chest pain,  Orthopnea, PND, swelling in lower extremities, anasarca, dizziness, palpitations, syncope.   GI  No heartburn, indigestion, abdominal pain, nausea, vomiting, diarrhea, change in bowel habits, loss of appetite, bloody stools.   Resp:    No coughing up of blood.     No chest wall deformity  Skin: no rash or lesions.  GU: no dysuria, change in color of urine, no urgency or frequency.  No flank pain, no hematuria   MS:  No joint pain or swelling.  No decreased range of motion.  No back pain.  Psych:  No change in mood or affect. No depression or anxiety.  No memory loss.     Objective:   Physical Exam BP 132/74  Pulse 84  Temp(Src) 98.4 F (36.9 C) (Oral)  Ht 5\' 1"  (1.549 m)  Wt 207 lb (93.895 kg)  BMI 39.11 kg/m2  SpO2 94%   GEN: A/Ox3; pleasant , NAD, overweight, O2  HEENT:  [place]/AT,  EACs-clear, TMs-wnl, NOSE-clear drainage , THROAT-clear, no lesions, no postnasal drip or exudate noted.   NECK:  Supple w/ fair ROM; no JVD; normal carotid impulses w/o bruits; no thyromegaly or nodules palpated; no lymphadenopathy.  RESP  -distant breath sounds with resolution of rhonchi and wheezes  CARD:  RRR, no m/r/g  , no peripheral  edema, pulses intact, no cyanosis or clubbing.  GI:   Soft & nt; nml bowel sounds; no organomegaly or  masses detected.  Musco: Warm bil, no deformities or joint swelling noted.   Neuro: alert, no focal deficits noted.    Skin: Warm, no lesions or rashes  No results found.        Asthmatic bronchitis Asthmatic bronchitis with recent exacerbation now improving  Plan Patient may now be cleared for planned orthopedic surgery with total knee replacement Pulmonary will need to be notified when the patient is to be admitted for this procedure No change in current pulmonary medications are recommended     Updated Medication List Outpatient Encounter Prescriptions as of 11/16/2011  Medication Sig Dispense Refill  . albuterol (PROAIR HFA) 108 (90 BASE) MCG/ACT inhaler Inhale 2 puffs into the lungs every 6 (six) hours as needed.  1 Inhaler  6  . ALPRAZolam (XANAX) 1 MG tablet Take 1/2 to 1 by mouth  twice a day as needed.  90 tablet  1  . amLODipine (NORVASC) 2.5 MG tablet Take 1 tablet (2.5 mg total) by mouth daily.  90 tablet  3  . Calcium Carbonate (CALTRATE 600 PO) Take 1 tablet by mouth daily.        . Cholecalciferol (VITAMIN D3) 1000 UNITS CAPS Take 1 capsule by mouth daily.        . CYMBALTA 60 MG capsule TAKE 1 CAPSULE DAILY  90 capsule  2  . EPINEPHrine (EPIPEN JR) 0.15 MG/0.3ML injection Inject 0.3 mLs (0.15 mg total) into the muscle once as needed for anaphylaxis. / Severe allergic reaction  1 each  6  . fluticasone-salmeterol (ADVAIR HFA) 230-21 MCG/ACT inhaler Inhale 2 puffs into the lungs 2 (two) times daily.  2 Inhaler  6  . levothyroxine (SYNTHROID, LEVOTHROID) 112 MCG tablet Take 1 tablet (112 mcg total) by mouth daily.  90 tablet  3  . Multiple Vitamin (MULTIVITAMIN) capsule Take 1 capsule by mouth daily.        Marland Kitchen oxyCODONE-acetaminophen (PERCOCET) 7.5-325 MG per tablet Take one by every 4 or 5 hours as needed. May take an extra 1/2 tab at night for pain if needed.  100  tablet  0  . potassium chloride (KLOR-CON) 10 MEQ CR tablet Take 1 tablet (10 mEq total) by mouth daily.  90 tablet  3  . ranitidine (ZANTAC) 150 MG tablet Take 1 tablet (150 mg total) by mouth 2 (two) times daily.  180 tablet  3  . SUMAtriptan (IMITREX) 50 MG tablet Take 50 mg by mouth every 2 (two) hours as needed.        . vitamin B-12 (CYANOCOBALAMIN) 1000 MCG tablet Take 1,000 mcg by mouth daily.        Marland Kitchen zolpidem (AMBIEN) 10 MG tablet Take 1 tablet (10 mg total) by mouth at bedtime.  30 tablet  5  . DISCONTD: Aclidinium Bromide (TUDORZA PRESSAIR) 400 MCG/ACT AEPB Inhale 1 puff into the lungs 2 (two) times daily.  1 each  0  . tiotropium (SPIRIVA HANDIHALER) 18 MCG inhalation capsule Place 1 capsule (18 mcg total) into inhaler and inhale daily.  30 capsule  11  . DISCONTD: furosemide (LASIX) 20 MG tablet Take 1 tablet (20 mg total) by mouth daily. For three days  30 tablet  6  . DISCONTD: predniSONE (DELTASONE) 10 MG tablet Take 4 for three days 3 for three days 2 for three days 1 for three days and stop  30 tablet  0

## 2011-11-16 NOTE — Patient Instructions (Signed)
Stop [employer] You are cleared for surgery, call me when this is scheduled No other medication changes Return 3 months after surgery

## 2011-11-27 ENCOUNTER — Encounter (HOSPITAL_COMMUNITY): Payer: Self-pay | Admitting: Pharmacy Technician

## 2011-11-28 ENCOUNTER — Inpatient Hospital Stay (HOSPITAL_COMMUNITY): Admission: RE | Admit: 2011-11-28 | Discharge: 2011-11-28 | Payer: Medicare Other | Source: Ambulatory Visit

## 2011-11-28 ENCOUNTER — Encounter (HOSPITAL_COMMUNITY): Payer: Self-pay

## 2011-11-28 HISTORY — DX: Chronic obstructive pulmonary disease, unspecified: J44.9

## 2011-11-28 HISTORY — DX: Chronic kidney disease, unspecified: N18.9

## 2011-11-28 HISTORY — DX: Cerebral infarction, unspecified: I63.9

## 2011-11-28 HISTORY — DX: Anxiety disorder, unspecified: F41.9

## 2011-11-28 NOTE — Pre-Procedure Instructions (Signed)
20 PRESLYNN BIER  11/28/2011   Your procedure is scheduled on: 12-11-2011 @ 12 noon  Report to [facility] at 10:00 AM.  Call this number if you have problems the morning of surgery: [phone]   Remember:   Do not eat food:After Midnight.  May have clear liquids: up to 4 Hours before arrival.until 6:00AM  Clear liquids include soda, tea, black coffee, apple or grape juice, broth.  Take these medicines the morning of surgery with A SIP OF WATER Proair, if needed,xanax, if needed, amlodipine,cymbalta,Advair inhaler,levothyroxine,Nasonex,percocet, if needed, zantac,Spiriva inhaler,   Do not wear jewelry, make-up or nail polish.  Do not wear lotions, powders, or perfumes. You may wear deodorant.  Do not shave 48 hours prior to surgery. Men may shave face and neck.  Do not bring valuables to the hospital.  Contacts, dentures or bridgework may not be worn into surgery.  Leave suitcase in the car. After surgery it may be brought to your room.  For patients admitted to the hospital, checkout time is 11:00 AM the day of discharge.     Special Instructions: CHG Shower Use Special Wash: 1/2 bottle night before surgery and 1/2 bottle morning of surgery.   Please read over the following fact sheets that you were given: Pain Booklet, Coughing and Deep Breathing, Blood Transfusion Information, MRSA Information and Surgical Site Infection Prevention

## 2011-12-04 ENCOUNTER — Ambulatory Visit (INDEPENDENT_AMBULATORY_CARE_PROVIDER_SITE_OTHER): Payer: Medicare Other | Admitting: Family Medicine

## 2011-12-04 ENCOUNTER — Encounter: Payer: Self-pay | Admitting: Family Medicine

## 2011-12-04 VITALS — BP 150/80 | HR 84 | Temp 98.3°F | Wt 206.8 lb

## 2011-12-04 DIAGNOSIS — J45909 Unspecified asthma, uncomplicated: Secondary | ICD-10-CM

## 2011-12-04 NOTE — Patient Instructions (Signed)
Don't change your meds and call with questions.  Take care.

## 2011-12-04 NOTE — Progress Notes (Signed)
She is much improved with the cough and she's planning surgery for R knee soon.   She's planning on going to rehab after the surgery.  We talked about her current situation.  She does have comorbid conditions, but at this point her knee pain is limiting her life significantly and she would like to pursue surgery.  Her cough is improved on current meds without fever, sputum.  Compliant with meds.  No other new symptoms to report.  Her chronic aches from fibromyalgia appear to be at baseline.    Meds, vitals, and allergies reviewed.   ROS: See HPI.  Otherwise, noncontributory.  nad ncat Mmm rrr distant breath sounds without rhonchi or wheezes Ext well perfused

## 2011-12-05 ENCOUNTER — Encounter (HOSPITAL_COMMUNITY)
Admission: RE | Admit: 2011-12-05 | Discharge: 2011-12-05 | Disposition: A | Discharge status: Home / Self Care | Payer: Medicare Other | Source: Ambulatory Visit | Attending: Orthopedic Surgery | Admitting: Orthopedic Surgery

## 2011-12-05 LAB — TYPE AND SCREEN
ABO/RH(D): O POS
Antibody Screen: NEGATIVE

## 2011-12-05 LAB — SURGICAL PCR SCREEN
MRSA, PCR: NEGATIVE
Staphylococcus aureus: NEGATIVE

## 2011-12-05 LAB — BASIC METABOLIC PANEL
BUN: 14 mg/dL (ref 6–23)
CO2: 32 mEq/L (ref 19–32)
Calcium: 9.6 mg/dL (ref 8.4–10.5)
Chloride: 101 mEq/L (ref 96–112)
Creatinine, Ser: 0.64 mg/dL (ref 0.50–1.10)
GFR calc Af Amer: >90 mL/min (ref >90)
GFR calc non Af Amer: 83 mL/min — ABNORMAL LOW (ref >90)
Glucose, Bld: 96 mg/dL (ref 70–99)
Potassium: 3.7 mEq/L (ref 3.5–5.1)
Sodium: 143 mEq/L (ref 135–145)

## 2011-12-05 LAB — CBC
HCT: 35.9 % — ABNORMAL LOW (ref 36.0–46.0)
Hemoglobin: 11.7 g/dL — ABNORMAL LOW (ref 12.0–15.0)
MCH: 30.6 pg (ref 26.0–34.0)
MCHC: 32.6 g/dL (ref 30.0–36.0)
MCV: 94 fL (ref 78.0–100.0)
Platelets: 230 10*3/uL (ref 150–400)
RBC: 3.82 MIL/uL — ABNORMAL LOW (ref 3.87–5.11)
RDW: 14.2 % (ref 11.5–15.5)
WBC: 6.6 10*3/uL (ref 4.0–10.5)

## 2011-12-05 NOTE — Assessment & Plan Note (Signed)
She appears not be in a flare and we talked about her pending surgery.  Per Dr. Delford Field, she is appropriate for surgery.  I will defer to him and the surgery/anesthesia team.  I would continue current meds.

## 2011-12-06 ENCOUNTER — Other Ambulatory Visit: Payer: Self-pay | Admitting: Orthopedic Surgery

## 2011-12-06 NOTE — Progress Notes (Signed)
4540   Thursday...Marland KitchenMarland KitchenSpoke with Sherrie at office......."i'll get those over to you"....Marland KitchenDA

## 2011-12-10 ENCOUNTER — Telehealth: Payer: Self-pay | Admitting: Critical Care Medicine

## 2011-12-10 MED ORDER — CEFAZOLIN SODIUM-DEXTROSE 2-3 GM-% IV SOLR
2.0000 g | INTRAVENOUS | Status: AC
  Administered 2011-12-11: 2 g via INTRAVENOUS
  Filled 2011-12-10: qty 50

## 2011-12-10 MED ORDER — CEFAZOLIN SODIUM 1-5 GM-% IV SOLN: 1.0000 g | INTRAVENOUS | Status: DC

## 2011-12-10 NOTE — Telephone Encounter (Signed)
Can also be reached at [phone].[name]

## 2011-12-10 NOTE — Telephone Encounter (Signed)
Spouse states he wanted PW to be aware of pt's surgery for tomorrow.

## 2011-12-11 ENCOUNTER — Encounter (HOSPITAL_COMMUNITY): Payer: Self-pay | Admitting: Anesthesiology

## 2011-12-11 ENCOUNTER — Inpatient Hospital Stay (HOSPITAL_COMMUNITY)
Admission: RE | Admit: 2011-12-11 | Discharge: 2011-12-14 | DRG: 470 | Disposition: A | Discharge status: Skilled Nursing Facility | Payer: Medicare Other | Source: Ambulatory Visit | Attending: Orthopedic Surgery | Admitting: Orthopedic Surgery

## 2011-12-11 ENCOUNTER — Encounter (HOSPITAL_COMMUNITY): Payer: Self-pay | Admitting: Orthopedic Surgery

## 2011-12-11 ENCOUNTER — Inpatient Hospital Stay (HOSPITAL_COMMUNITY): Payer: Medicare Other

## 2011-12-11 ENCOUNTER — Ambulatory Visit (HOSPITAL_COMMUNITY): Payer: Medicare Other | Admitting: Anesthesiology

## 2011-12-11 ENCOUNTER — Encounter (HOSPITAL_COMMUNITY): Payer: Self-pay | Admitting: *Deleted

## 2011-12-11 ENCOUNTER — Encounter (HOSPITAL_COMMUNITY)
Admission: RE | Disposition: A | Discharge status: Skilled Nursing Facility | Payer: Self-pay | Source: Ambulatory Visit | Attending: Orthopedic Surgery

## 2011-12-11 DIAGNOSIS — F411 Generalized anxiety disorder: Secondary | ICD-10-CM | POA: Diagnosis present

## 2011-12-11 DIAGNOSIS — Z79899 Other long term (current) drug therapy: Secondary | ICD-10-CM

## 2011-12-11 DIAGNOSIS — M1711 Unilateral primary osteoarthritis, right knee: Secondary | ICD-10-CM

## 2011-12-11 DIAGNOSIS — J449 Chronic obstructive pulmonary disease, unspecified: Secondary | ICD-10-CM | POA: Diagnosis present

## 2011-12-11 DIAGNOSIS — R0902 Hypoxemia: Secondary | ICD-10-CM | POA: Insufficient documentation

## 2011-12-11 DIAGNOSIS — J45909 Unspecified asthma, uncomplicated: Secondary | ICD-10-CM

## 2011-12-11 DIAGNOSIS — K219 Gastro-esophageal reflux disease without esophagitis: Secondary | ICD-10-CM | POA: Diagnosis present

## 2011-12-11 DIAGNOSIS — Z01812 Encounter for preprocedural laboratory examination: Secondary | ICD-10-CM

## 2011-12-11 DIAGNOSIS — Z9981 Dependence on supplemental oxygen: Secondary | ICD-10-CM

## 2011-12-11 DIAGNOSIS — N189 Chronic kidney disease, unspecified: Secondary | ICD-10-CM | POA: Diagnosis present

## 2011-12-11 DIAGNOSIS — Z96619 Presence of unspecified artificial shoulder joint: Secondary | ICD-10-CM

## 2011-12-11 DIAGNOSIS — Z8673 Personal history of transient ischemic attack (TIA), and cerebral infarction without residual deficits: Secondary | ICD-10-CM

## 2011-12-11 DIAGNOSIS — M1710 Unilateral primary osteoarthritis, unspecified knee: Principal | ICD-10-CM | POA: Diagnosis present

## 2011-12-11 DIAGNOSIS — I129 Hypertensive chronic kidney disease with stage 1 through stage 4 chronic kidney disease, or unspecified chronic kidney disease: Secondary | ICD-10-CM | POA: Diagnosis present

## 2011-12-11 DIAGNOSIS — M810 Age-related osteoporosis without current pathological fracture: Secondary | ICD-10-CM | POA: Diagnosis present

## 2011-12-11 DIAGNOSIS — J4489 Other specified chronic obstructive pulmonary disease: Secondary | ICD-10-CM | POA: Insufficient documentation

## 2011-12-11 DIAGNOSIS — E785 Hyperlipidemia, unspecified: Secondary | ICD-10-CM | POA: Diagnosis present

## 2011-12-11 DIAGNOSIS — E039 Hypothyroidism, unspecified: Secondary | ICD-10-CM | POA: Diagnosis present

## 2011-12-11 DIAGNOSIS — D62 Acute posthemorrhagic anemia: Secondary | ICD-10-CM | POA: Diagnosis not present

## 2011-12-11 HISTORY — PX: TOTAL KNEE ARTHROPLASTY: SHX125

## 2011-12-11 HISTORY — DX: Unilateral primary osteoarthritis, right knee: M17.11

## 2011-12-11 LAB — APTT: aPTT: 39 seconds — ABNORMAL HIGH (ref 24–37)

## 2011-12-11 LAB — PROTIME-INR
INR: 1.13 (ref 0.00–1.49)
Prothrombin Time: 14.7 seconds (ref 11.6–15.2)

## 2011-12-11 SURGERY — ARTHROPLASTY, KNEE, TOTAL
Anesthesia: General | Laterality: Right | Wound class: Clean

## 2011-12-11 MED ORDER — FLUTICASONE-SALMETEROL 230-21 MCG/ACT IN AERO: 2.0000 | INHALATION_SPRAY | Freq: Two times a day (BID) | RESPIRATORY_TRACT | Status: DC

## 2011-12-11 MED ORDER — PHENOL 1.4 % MT LIQD: 1.0000 | OROMUCOSAL | Status: DC | PRN

## 2011-12-11 MED ORDER — HYDROMORPHONE HCL PF 1 MG/ML IJ SOLN
0.5000 mg | INTRAMUSCULAR | Status: DC | PRN
  Administered 2011-12-11 – 2011-12-12 (×3): 1 mg via INTRAVENOUS
  Filled 2011-12-11 (×2): qty 1

## 2011-12-11 MED ORDER — ONDANSETRON HCL 4 MG PO TABS: 4.0000 mg | ORAL_TABLET | Freq: Four times a day (QID) | ORAL | Status: DC | PRN

## 2011-12-11 MED ORDER — ALUM & MAG HYDROXIDE-SIMETH 200-200-20 MG/5ML PO SUSP: 30.0000 mL | ORAL | Status: DC | PRN

## 2011-12-11 MED ORDER — LACTATED RINGERS IV SOLN
INTRAVENOUS | Status: DC | PRN
  Administered 2011-12-11 (×3): via INTRAVENOUS

## 2011-12-11 MED ORDER — DOCUSATE SODIUM 100 MG PO CAPS
100.0000 mg | ORAL_CAPSULE | Freq: Two times a day (BID) | ORAL | Status: DC
  Administered 2011-12-11 – 2011-12-14 (×6): 100 mg via ORAL
  Filled 2011-12-11 (×6): qty 1

## 2011-12-11 MED ORDER — METHOCARBAMOL 100 MG/ML IJ SOLN
500.0000 mg | Freq: Four times a day (QID) | INTRAVENOUS | Status: DC | PRN
  Filled 2011-12-11: qty 5

## 2011-12-11 MED ORDER — POTASSIUM CHLORIDE IN NACL 20-0.45 MEQ/L-% IV SOLN
INTRAVENOUS | Status: DC
  Administered 2011-12-11: 19:00:00 via INTRAVENOUS
  Filled 2011-12-11 (×5): qty 1000

## 2011-12-11 MED ORDER — ROCURONIUM BROMIDE 100 MG/10ML IV SOLN
INTRAVENOUS | Status: DC | PRN
  Administered 2011-12-11: 50 mg via INTRAVENOUS

## 2011-12-11 MED ORDER — SUMATRIPTAN SUCCINATE 50 MG PO TABS
50.0000 mg | ORAL_TABLET | ORAL | Status: DC | PRN
  Filled 2011-12-11: qty 1

## 2011-12-11 MED ORDER — HYDROMORPHONE HCL PF 1 MG/ML IJ SOLN
INTRAMUSCULAR | Status: AC
  Filled 2011-12-11: qty 1

## 2011-12-11 MED ORDER — ENOXAPARIN SODIUM 40 MG/0.4ML ~~LOC~~ SOLN: 40.0000 mg | SUBCUTANEOUS | Status: DC

## 2011-12-11 MED ORDER — AMLODIPINE BESYLATE 2.5 MG PO TABS
2.5000 mg | ORAL_TABLET | Freq: Every day | ORAL | Status: DC
  Administered 2011-12-12 – 2011-12-14 (×3): 2.5 mg via ORAL
  Filled 2011-12-11 (×3): qty 1

## 2011-12-11 MED ORDER — METHOCARBAMOL 500 MG PO TABS: 500.0000 mg | ORAL_TABLET | Freq: Four times a day (QID) | ORAL | Status: AC

## 2011-12-11 MED ORDER — OXYCODONE-ACETAMINOPHEN 10-325 MG PO TABS: 1.0000 | ORAL_TABLET | Freq: Four times a day (QID) | ORAL | Status: AC | PRN

## 2011-12-11 MED ORDER — DEXAMETHASONE SODIUM PHOSPHATE 4 MG/ML IJ SOLN
INTRAMUSCULAR | Status: DC | PRN
  Administered 2011-12-11: 8 mg via INTRAVENOUS

## 2011-12-11 MED ORDER — WARFARIN VIDEO: Freq: Once | Status: DC

## 2011-12-11 MED ORDER — ACETAMINOPHEN 325 MG PO TABS
650.0000 mg | ORAL_TABLET | Freq: Four times a day (QID) | ORAL | Status: DC | PRN
  Administered 2011-12-14: 650 mg via ORAL
  Filled 2011-12-11: qty 2

## 2011-12-11 MED ORDER — HYDROMORPHONE HCL PF 1 MG/ML IJ SOLN
0.2500 mg | INTRAMUSCULAR | Status: DC | PRN
  Administered 2011-12-11 (×3): 0.5 mg via INTRAVENOUS

## 2011-12-11 MED ORDER — MIDAZOLAM HCL 2 MG/2ML IJ SOLN
INTRAMUSCULAR | Status: AC
Start: 2011-12-11 — End: 2011-12-11
  Filled 2011-12-11: qty 2

## 2011-12-11 MED ORDER — FLUTICASONE PROPIONATE 50 MCG/ACT NA SUSP
1.0000 | Freq: Every day | NASAL | Status: DC
  Administered 2011-12-12 – 2011-12-14 (×3): 1 via NASAL
  Filled 2011-12-11: qty 16

## 2011-12-11 MED ORDER — OXYCODONE-ACETAMINOPHEN 5-325 MG PO TABS
1.0000 | ORAL_TABLET | ORAL | Status: DC | PRN
  Administered 2011-12-12 – 2011-12-13 (×4): 2 via ORAL
  Filled 2011-12-11 (×5): qty 2

## 2011-12-11 MED ORDER — OXYCODONE HCL 5 MG PO TABS
5.0000 mg | ORAL_TABLET | ORAL | Status: DC | PRN
  Administered 2011-12-12 – 2011-12-13 (×3): 10 mg via ORAL
  Administered 2011-12-14: 5 mg via ORAL
  Administered 2011-12-14: 10 mg via ORAL
  Administered 2011-12-14: 5 mg via ORAL
  Filled 2011-12-11: qty 10
  Filled 2011-12-11: qty 1
  Filled 2011-12-11: qty 2
  Filled 2011-12-11: qty 1
  Filled 2011-12-11: qty 2
  Filled 2011-12-11 (×2): qty 1

## 2011-12-11 MED ORDER — METOCLOPRAMIDE HCL 10 MG PO TABS: 5.0000 mg | ORAL_TABLET | Freq: Three times a day (TID) | ORAL | Status: DC | PRN

## 2011-12-11 MED ORDER — ALBUTEROL SULFATE HFA 108 (90 BASE) MCG/ACT IN AERS: 2.0000 | INHALATION_SPRAY | Freq: Four times a day (QID) | RESPIRATORY_TRACT | Status: DC | PRN

## 2011-12-11 MED ORDER — TIOTROPIUM BROMIDE MONOHYDRATE 18 MCG IN CAPS
18.0000 ug | ORAL_CAPSULE | Freq: Every day | RESPIRATORY_TRACT | Status: DC
  Administered 2011-12-12 – 2011-12-13 (×2): 18 ug via RESPIRATORY_TRACT
  Filled 2011-12-11: qty 5

## 2011-12-11 MED ORDER — WARFARIN SODIUM 5 MG PO TABS: 5.0000 mg | ORAL_TABLET | Freq: Every day | ORAL | Status: DC

## 2011-12-11 MED ORDER — FENTANYL CITRATE 0.05 MG/ML IJ SOLN
50.0000 ug | INTRAMUSCULAR | Status: DC | PRN
  Administered 2011-12-11 (×2): 50 ug via INTRAVENOUS

## 2011-12-11 MED ORDER — LEVOTHYROXINE SODIUM 112 MCG PO TABS
112.0000 ug | ORAL_TABLET | Freq: Every day | ORAL | Status: DC
  Administered 2011-12-12 – 2011-12-14 (×3): 112 ug via ORAL
  Filled 2011-12-11 (×4): qty 1

## 2011-12-11 MED ORDER — PROPOFOL 10 MG/ML IV EMUL
INTRAVENOUS | Status: DC | PRN
  Administered 2011-12-11: 130 mg via INTRAVENOUS

## 2011-12-11 MED ORDER — COUMADIN BOOK
Freq: Once | Status: DC
  Filled 2011-12-11: qty 1

## 2011-12-11 MED ORDER — CEFAZOLIN SODIUM-DEXTROSE 2-3 GM-% IV SOLR
2.0000 g | Freq: Four times a day (QID) | INTRAVENOUS | Status: AC
  Administered 2011-12-11 – 2011-12-12 (×3): 2 g via INTRAVENOUS
  Filled 2011-12-11 (×3): qty 50

## 2011-12-11 MED ORDER — ZOLPIDEM TARTRATE 10 MG PO TABS
5.0000 mg | ORAL_TABLET | Freq: Every day | ORAL | Status: DC
  Administered 2011-12-11 – 2011-12-13 (×3): 5 mg via ORAL
  Filled 2011-12-11 (×2): qty 1

## 2011-12-11 MED ORDER — VITAMIN B-12 1000 MCG PO TABS
1000.0000 ug | ORAL_TABLET | Freq: Every day | ORAL | Status: DC
  Administered 2011-12-12 – 2011-12-14 (×3): 1000 ug via ORAL
  Filled 2011-12-11 (×3): qty 1

## 2011-12-11 MED ORDER — LIDOCAINE HCL (CARDIAC) 20 MG/ML IV SOLN
INTRAVENOUS | Status: DC | PRN
  Administered 2011-12-11: 100 mg via INTRAVENOUS

## 2011-12-11 MED ORDER — ENOXAPARIN SODIUM 30 MG/0.3ML ~~LOC~~ SOLN
30.0000 mg | Freq: Two times a day (BID) | SUBCUTANEOUS | Status: DC
  Administered 2011-12-12 – 2011-12-13 (×4): 30 mg via SUBCUTANEOUS
  Filled 2011-12-11 (×7): qty 0.3

## 2011-12-11 MED ORDER — FENTANYL CITRATE 0.05 MG/ML IJ SOLN
INTRAMUSCULAR | Status: AC
  Filled 2011-12-11: qty 2

## 2011-12-11 MED ORDER — DIPHENHYDRAMINE HCL 12.5 MG/5ML PO ELIX: 12.5000 mg | ORAL_SOLUTION | ORAL | Status: DC | PRN

## 2011-12-11 MED ORDER — BUPIVACAINE-EPINEPHRINE PF 0.5-1:200000 % IJ SOLN
INTRAMUSCULAR | Status: DC | PRN
  Administered 2011-12-11: 25 mL

## 2011-12-11 MED ORDER — FENTANYL CITRATE 0.05 MG/ML IJ SOLN
50.0000 ug | INTRAMUSCULAR | Status: DC | PRN
  Administered 2011-12-11: 75 ug via INTRAVENOUS

## 2011-12-11 MED ORDER — MENTHOL 3 MG MT LOZG: 1.0000 | LOZENGE | OROMUCOSAL | Status: DC | PRN

## 2011-12-11 MED ORDER — ZOLPIDEM TARTRATE 5 MG PO TABS
5.0000 mg | ORAL_TABLET | Freq: Every evening | ORAL | Status: DC | PRN
  Filled 2011-12-11 (×2): qty 1

## 2011-12-11 MED ORDER — METOCLOPRAMIDE HCL 5 MG/ML IJ SOLN
5.0000 mg | Freq: Three times a day (TID) | INTRAMUSCULAR | Status: DC | PRN
  Filled 2011-12-11: qty 2

## 2011-12-11 MED ORDER — FAMOTIDINE 20 MG PO TABS
20.0000 mg | ORAL_TABLET | Freq: Every day | ORAL | Status: DC
  Administered 2011-12-12 – 2011-12-14 (×3): 20 mg via ORAL
  Filled 2011-12-11 (×3): qty 1

## 2011-12-11 MED ORDER — METHOCARBAMOL 500 MG PO TABS
500.0000 mg | ORAL_TABLET | Freq: Four times a day (QID) | ORAL | Status: DC | PRN
  Administered 2011-12-12 – 2011-12-14 (×3): 500 mg via ORAL
  Filled 2011-12-11 (×4): qty 1

## 2011-12-11 MED ORDER — ACETAMINOPHEN 650 MG RE SUPP: 650.0000 mg | Freq: Four times a day (QID) | RECTAL | Status: DC | PRN

## 2011-12-11 MED ORDER — NEOSTIGMINE METHYLSULFATE 1 MG/ML IJ SOLN
INTRAMUSCULAR | Status: DC | PRN
  Administered 2011-12-11: 4 mg via INTRAVENOUS

## 2011-12-11 MED ORDER — FENTANYL CITRATE 0.05 MG/ML IJ SOLN
INTRAMUSCULAR | Status: DC | PRN
  Administered 2011-12-11: 100 ug via INTRAVENOUS
  Administered 2011-12-11 (×2): 50 ug via INTRAVENOUS
  Administered 2011-12-11 (×2): 100 ug via INTRAVENOUS
  Administered 2011-12-11: 50 ug via INTRAVENOUS

## 2011-12-11 MED ORDER — ALPRAZOLAM 0.5 MG PO TABS
0.5000 mg | ORAL_TABLET | Freq: Two times a day (BID) | ORAL | Status: DC | PRN
  Administered 2011-12-12 – 2011-12-13 (×2): 0.5 mg via ORAL
  Filled 2011-12-11 (×2): qty 1

## 2011-12-11 MED ORDER — ONDANSETRON HCL 4 MG/2ML IJ SOLN
INTRAMUSCULAR | Status: DC | PRN
  Administered 2011-12-11: 4 mg via INTRAVENOUS

## 2011-12-11 MED ORDER — LACTATED RINGERS IV SOLN
INTRAVENOUS | Status: DC
  Administered 2011-12-11: 11:00:00 via INTRAVENOUS

## 2011-12-11 MED ORDER — POTASSIUM CHLORIDE CRYS ER 10 MEQ PO TBCR
10.0000 meq | EXTENDED_RELEASE_TABLET | Freq: Every day | ORAL | Status: DC
  Administered 2011-12-11 – 2011-12-14 (×4): 10 meq via ORAL
  Filled 2011-12-11 (×4): qty 1

## 2011-12-11 MED ORDER — ONDANSETRON HCL 4 MG/2ML IJ SOLN: 4.0000 mg | Freq: Once | INTRAMUSCULAR | Status: DC | PRN

## 2011-12-11 MED ORDER — FLUTICASONE-SALMETEROL 250-50 MCG/DOSE IN AEPB
1.0000 | INHALATION_SPRAY | Freq: Two times a day (BID) | RESPIRATORY_TRACT | Status: DC
  Administered 2011-12-11 – 2011-12-13 (×5): 1 via RESPIRATORY_TRACT
  Filled 2011-12-11: qty 14

## 2011-12-11 MED ORDER — SENNA 8.6 MG PO TABS
1.0000 | ORAL_TABLET | Freq: Two times a day (BID) | ORAL | Status: DC
  Administered 2011-12-11 – 2011-12-14 (×6): 8.6 mg via ORAL
  Filled 2011-12-11 (×7): qty 1

## 2011-12-11 MED ORDER — DULOXETINE HCL 60 MG PO CPEP
60.0000 mg | ORAL_CAPSULE | Freq: Every day | ORAL | Status: DC
  Administered 2011-12-11 – 2011-12-14 (×4): 60 mg via ORAL
  Filled 2011-12-11 (×4): qty 1

## 2011-12-11 MED ORDER — ONDANSETRON HCL 4 MG/2ML IJ SOLN: 4.0000 mg | Freq: Four times a day (QID) | INTRAMUSCULAR | Status: DC | PRN

## 2011-12-11 MED ORDER — LIDOCAINE HCL 4 % MT SOLN
OROMUCOSAL | Status: DC | PRN
  Administered 2011-12-11: 4 mL via TOPICAL

## 2011-12-11 MED ORDER — GLYCOPYRROLATE 0.2 MG/ML IJ SOLN
INTRAMUSCULAR | Status: DC | PRN
  Administered 2011-12-11: .6 mg via INTRAVENOUS

## 2011-12-11 MED ORDER — WARFARIN - PHARMACIST DOSING INPATIENT: Freq: Every day | Status: DC

## 2011-12-11 MED ORDER — SODIUM CHLORIDE 0.9 % IR SOLN
Status: DC | PRN
  Administered 2011-12-11: 1000 mL

## 2011-12-11 MED ORDER — WARFARIN SODIUM 5 MG PO TABS
5.0000 mg | ORAL_TABLET | Freq: Once | ORAL | Status: AC
  Administered 2011-12-11: 5 mg via ORAL
  Filled 2011-12-11: qty 1

## 2011-12-11 SURGICAL SUPPLY — 57 items
BANDAGE ELASTIC 4 VELCRO ST LF (GAUZE/BANDAGES/DRESSINGS) ×2 IMPLANT
BANDAGE ELASTIC 6 VELCRO ST LF (GAUZE/BANDAGES/DRESSINGS) ×2 IMPLANT
BANDAGE ESMARK 6X9 LF (GAUZE/BANDAGES/DRESSINGS) ×1 IMPLANT
BENZOIN TINCTURE PRP APPL 2/3 (GAUZE/BANDAGES/DRESSINGS) IMPLANT
BLADE SAG 18X100X1.27 (BLADE) ×2 IMPLANT
BLADE SAW RECIP 87.9 MT (BLADE) ×2 IMPLANT
BLADE SAW SGTL 13X75X1.27 (BLADE) ×2 IMPLANT
BNDG ESMARK 6X9 LF (GAUZE/BANDAGES/DRESSINGS) ×2
BOOTCOVER CLEANROOM LRG (PROTECTIVE WEAR) IMPLANT
BOWL SMART MIX CTS (DISPOSABLE) ×2 IMPLANT
CEMENT HV SMART SET (Cement) ×4 IMPLANT
CLOTH BEACON ORANGE TIMEOUT ST (SAFETY) ×2 IMPLANT
CLSR STERI-STRIP ANTIMIC 1/2X4 (GAUZE/BANDAGES/DRESSINGS) ×2 IMPLANT
COVER SURGICAL LIGHT HANDLE (MISCELLANEOUS) ×4 IMPLANT
CUFF TOURNIQUET SINGLE 34IN LL (TOURNIQUET CUFF) IMPLANT
DRAPE EXTREMITY T 121X128X90 (DRAPE) ×2 IMPLANT
DRAPE PROXIMA HALF (DRAPES) ×2 IMPLANT
DRAPE U-SHAPE 47X51 STRL (DRAPES) ×2 IMPLANT
DRSG PAD ABDOMINAL 8X10 ST (GAUZE/BANDAGES/DRESSINGS) ×2 IMPLANT
DURAPREP 26ML APPLICATOR (WOUND CARE) ×2 IMPLANT
ELECT CAUTERY BLADE 6.4 (BLADE) ×2 IMPLANT
ELECT REM PT RETURN 9FT ADLT (ELECTROSURGICAL) ×2
ELECTRODE REM PT RTRN 9FT ADLT (ELECTROSURGICAL) ×1 IMPLANT
EVACUATOR 1/8 PVC DRAIN (DRAIN) ×2 IMPLANT
FACESHIELD LNG OPTICON STERILE (SAFETY) ×2 IMPLANT
GLOVE BIOGEL PI IND STRL 8 (GLOVE) IMPLANT
GLOVE BIOGEL PI INDICATOR 8 (GLOVE)
GLOVE ORTHO TXT STRL SZ7.5 (GLOVE) ×4 IMPLANT
GLOVE SURG ORTHO 8.0 STRL STRW (GLOVE) IMPLANT
HANDPIECE INTERPULSE COAX TIP (DISPOSABLE) ×1
HOOD PEEL AWAY FACE SHEILD DIS (HOOD) ×4 IMPLANT
KIT BASIN OR (CUSTOM PROCEDURE TRAY) ×2 IMPLANT
KIT ROOM TURNOVER OR (KITS) ×2 IMPLANT
MANIFOLD NEPTUNE II (INSTRUMENTS) ×2 IMPLANT
NS IRRIG 1000ML POUR BTL (IV SOLUTION) ×2 IMPLANT
PACK TOTAL JOINT (CUSTOM PROCEDURE TRAY) ×2 IMPLANT
PAD ARMBOARD 7.5X6 YLW CONV (MISCELLANEOUS) ×4 IMPLANT
PAD CAST 4YDX4 CTTN HI CHSV (CAST SUPPLIES) ×1 IMPLANT
PADDING CAST COTTON 4X4 STRL (CAST SUPPLIES) ×1
PADDING CAST COTTON 6X4 STRL (CAST SUPPLIES) ×2 IMPLANT
SET HNDPC FAN SPRY TIP SCT (DISPOSABLE) ×1 IMPLANT
SPONGE GAUZE 4X4 12PLY (GAUZE/BANDAGES/DRESSINGS) ×2 IMPLANT
STAPLER VISISTAT 35W (STAPLE) ×2 IMPLANT
STRIP CLOSURE SKIN 1/2X4 (GAUZE/BANDAGES/DRESSINGS) ×2 IMPLANT
SUCTION FRAZIER TIP 10 FR DISP (SUCTIONS) ×2 IMPLANT
SUT MNCRL AB 4-0 PS2 18 (SUTURE) ×2 IMPLANT
SUT VIC AB 0 CT1 27 (SUTURE) ×1
SUT VIC AB 0 CT1 27XBRD ANBCTR (SUTURE) ×1 IMPLANT
SUT VIC AB 1 CTX 27 (SUTURE) ×2 IMPLANT
SUT VIC AB 2-0 CT1 27 (SUTURE) ×2
SUT VIC AB 2-0 CT1 TAPERPNT 27 (SUTURE) ×2 IMPLANT
SUT VIC AB 3-0 SH 18 (SUTURE) ×2 IMPLANT
SYR 30ML LL (SYRINGE) ×2 IMPLANT
TOWEL OR 17X24 6PK STRL BLUE (TOWEL DISPOSABLE) ×2 IMPLANT
TOWEL OR 17X26 10 PK STRL BLUE (TOWEL DISPOSABLE) ×2 IMPLANT
TRAY FOLEY CATH 14FR (SET/KITS/TRAYS/PACK) ×2 IMPLANT
WATER STERILE IRR 1000ML POUR (IV SOLUTION) ×6 IMPLANT

## 2011-12-11 NOTE — Anesthesia Procedure Notes (Addendum)
Anesthesia Regional Block:  Femoral nerve block  Pre-Anesthetic Checklist: ,, timeout performed, Correct Patient, Correct Site, Correct Laterality, Correct Procedure, Correct Position, site marked, Risks and benefits discussed,  Surgical consent,  Pre-op evaluation,  At surgeon's request and post-op pain management  Laterality: Right  Prep: Maximum Sterile Barrier Precautions used, chloraprep and alcohol swabs       Needles:  Injection technique: Single-shot  Needle Type: Stimulator Needle - 80        Needle insertion depth: 7 cm   Additional Needles:  Procedures: nerve stimulator Femoral nerve block  Nerve Stimulator or Paresthesia:  Response: 0.5 mA, 0.1 ms, 7 cm  Additional Responses:   Narrative:  Start time: 12/11/2011 11:40 AM End time: 12/11/2011 11:47 AM Injection made incrementally with aspirations every 5 mL.  Performed by: Personally  Anesthesiologist: Maren Beach MD  Additional Notes: 25cc 0.5% Marcaine w/ epi w/o difficulty or discomfort. GES   Procedure Name: Intubation Date/Time: 12/11/2011 12:03 PM Performed by: Jerilee Hoh Pre-anesthesia Checklist: Patient identified, Emergency Drugs available, Suction available and Patient being monitored Patient Re-evaluated:Patient Re-evaluated prior to inductionOxygen Delivery Method: Circle system utilized Preoxygenation: Pre-oxygenation with 100% oxygen Intubation Type: IV induction Ventilation: Mask ventilation without difficulty Laryngoscope Size: Mac and 3 Grade View: Grade I Tube type: Oral Tube size: 7.0 mm Number of attempts: 1 Airway Equipment and Method: Stylet and LTA kit utilized Placement Confirmation: ETT inserted through vocal cords under direct vision,  positive ETCO2 and breath sounds checked- equal and bilateral Secured at: 22 cm Tube secured with: Tape Dental Injury: Teeth and Oropharynx as per pre-operative assessment

## 2011-12-11 NOTE — Progress Notes (Signed)
ANTICOAGULATION CONSULT NOTE - Initial Consult  Pharmacy Consult for Coumadin Indication: VTE prophylaxis s/p R TKA  Allergies  Allergen Reactions  . Sulfadiazine     REACTION: Fever, achey    Patient Measurements: Height: 5\' 1"  (154.9 cm) Weight: 205 lb 14.6 oz (93.4 kg) IBW/kg (Calculated) : 47.8   Vital Signs: Temp: 98.4 F (36.9 C) (06/04 1642) Temp src: Oral (06/04 0953) BP: 131/72 mmHg (06/04 1642) Pulse Rate: 71  (06/04 1642)  Labs:  Basename 12/11/11 1001  HGB --  HCT --  PLT --  APTT 39*  LABPROT 14.7  INR 1.13  HEPARINUNFRC --  CREATININE --  CKTOTAL --  CKMB --  TROPONINI --    Estimated Creatinine Clearance: 60.4 ml/min (by C-G formula based on Cr of 0.64).   Medical History: Past Medical History  Diagnosis Date  . Osteoporosis, unspecified   . Allergic rhinitis, cause unspecified   . Other chronic pain   . Other abnormal blood chemistry   . Insomnia, unspecified   . Acute bronchitis   . Need for prophylactic hormone replacement therapy (postmenopausal)   . Degeneration of intervertebral disc, site unspecified   . Allergy, unspecified not elsewhere classified   . Myalgia and myositis, unspecified   . Unspecified essential hypertension   . Other and unspecified hyperlipidemia   . Esophageal reflux   . Diverticulosis of colon (without mention of hemorrhage)   . Unspecified hypothyroidism   . Stroke     mini strokes  . Asthma   . Shortness of breath   . COPD (chronic obstructive pulmonary disease)   . Chronic kidney disease     frequency  . Anxiety   . Osteoarthritis of right knee 08/27/2011    R knee pain, s/p arthroscopy 2012 per [name]     Medications:  Prescriptions prior to admission  Medication Sig Dispense Refill  . albuterol (PROAIR HFA) 108 (90 BASE) MCG/ACT inhaler Inhale 2 puffs into the lungs every 6 (six) hours as needed.  1 Inhaler  6  . ALPRAZolam (XANAX) 1 MG tablet Take 0.5 mg by mouth 2 (two) times daily as  needed. anxiety      . amLODipine (NORVASC) 2.5 MG tablet Take 1 tablet (2.5 mg total) by mouth daily.  90 tablet  3  . Calcium Carbonate (CALTRATE 600 PO) Take 1 tablet by mouth daily.        . Cholecalciferol (VITAMIN D3) 1000 UNITS CAPS Take 1 capsule by mouth daily.        . DULoxetine (CYMBALTA) 60 MG capsule Take 60 mg by mouth daily.      . fluticasone-salmeterol (ADVAIR HFA) 230-21 MCG/ACT inhaler Inhale 2 puffs into the lungs 2 (two) times daily.  2 Inhaler  6  . levothyroxine (SYNTHROID, LEVOTHROID) 112 MCG tablet Take 1 tablet (112 mcg total) by mouth daily.  90 tablet  3  . mometasone (NASONEX) 50 MCG/ACT nasal spray Place 2 sprays into the nose 2 (two) times daily.      . Multiple Vitamin (MULTIVITAMIN) capsule Take 1 capsule by mouth daily.        Marland Kitchen oxyCODONE-acetaminophen (PERCOCET) 7.5-325 MG per tablet Take 1-1.5 tablets by mouth every 4 (four) hours as needed. Pain .  Takes 1.5 tabs at bedtime.      . potassium chloride (KLOR-CON) 10 MEQ CR tablet Take 1 tablet (10 mEq total) by mouth daily.  90 tablet  3  . ranitidine (ZANTAC) 150 MG tablet Take 1 tablet (150 mg  total) by mouth 2 (two) times daily.  180 tablet  3  . tiotropium (SPIRIVA HANDIHALER) 18 MCG inhalation capsule Place 1 capsule (18 mcg total) into inhaler and inhale daily.  30 capsule  11  . vitamin B-12 (CYANOCOBALAMIN) 1000 MCG tablet Take 1,000 mcg by mouth daily.        Marland Kitchen zolpidem (AMBIEN) 10 MG tablet Take 1 tablet (10 mg total) by mouth at bedtime.  30 tablet  5  . SUMAtriptan (IMITREX) 50 MG tablet Take 50 mg by mouth every 2 (two) hours as needed. headache        Assessment: 78 y.o. female to begin coumadin for VTE prophylaxis s/p R TKA. Baseline INR 1.13. Also on Lovenox 30mg  SQ q12h until INR >/= 1.8  Goal of Therapy:  INR 2-3 Monitor platelets by anticoagulation protocol: Yes   Plan:  1. Coumadin 5 mg po today 2. Daily PT/INR 3. Coumadin Educational Booklet and Video  Christoper Fabian, PharmD,  BCPS Clinical pharmacist, pager [phone] 12/11/2011,5:07 PM

## 2011-12-11 NOTE — Preoperative (Signed)
Beta Blockers   Reason not to administer Beta Blockers:Not Applicable 

## 2011-12-11 NOTE — Plan of Care (Signed)
Problem: Consults Goal: Diagnosis- Total Joint Replacement Primary Total Knee Right     

## 2011-12-11 NOTE — Progress Notes (Signed)
Orthopedic Tech Progress Note Patient Details:  [name] 08/06/1930 [id number]  Ortho Devices Type of Ortho Device: Knee Immobilizer Ortho Device/Splint Location: right knee Ortho Device/Splint Interventions: Freeman Caldron, [place] 12/11/2011, 5:21 PM

## 2011-12-11 NOTE — Progress Notes (Signed)
Orthopedic Tech Progress Note Patient Details:  [name] 1930-07-12 [id number]  CPM Right Knee CPM Right Knee: On Right Knee Flexion (Degrees): 40  Right Knee Extension (Degrees): 0  Additional Comments: trapeze bar patient helper   Nikki Dom 12/11/2011, 3:43 PM

## 2011-12-11 NOTE — Anesthesia Postprocedure Evaluation (Signed)
  Anesthesia Post-op Note  Patient: [name]  Procedure(s) Performed: Procedure(s) (LRB): TOTAL KNEE ARTHROPLASTY (Right)  Patient Location: PACU  Anesthesia Type: GA combined with regional for post-op pain  Level of Consciousness: awake, oriented and patient cooperative  Airway and Oxygen Therapy: Patient Spontanous Breathing and Patient connected to nasal cannula oxygen  Post-op Pain: mild  Post-op Assessment: Post-op Vital signs reviewed, Patient's Cardiovascular Status Stable, Respiratory Function Stable, Patent Airway, No signs of Nausea or vomiting and Pain level controlled  Post-op Vital Signs: stable  Complications: No apparent anesthesia complications

## 2011-12-11 NOTE — Progress Notes (Signed)
Pt. States surgery is supposed to be on right knee instead of left knee.  Dr. Dion Saucier notified ,new order obtained for consent.

## 2011-12-11 NOTE — Progress Notes (Signed)
Orthopedic Tech Progress Note Patient Details:  [name] 03-Sep-1930 [id number]  Patient ID: [name], female   DOB: Feb 05, 1931, 78 y.o.   MRN: [id number] Viewed order from rn order list  Nikki Dom 12/11/2011, 3:44 PM

## 2011-12-11 NOTE — H&P (Signed)
PREOPERATIVE H&P  Chief Complaint: DJD RIGHT KNEE  HPI: [name] is a 78 y.o. female who presents for preoperative history and physical with a diagnosis of DJD RIGHT KNEE. Symptoms are rated as moderate to severe, and have been worsening.  This is significantly impairing activities of daily living.  She has elected for surgical management.   Past Medical History  Diagnosis Date  . Osteoporosis, unspecified   . Allergic rhinitis, cause unspecified   . Other chronic pain   . Other abnormal blood chemistry   . Insomnia, unspecified   . Acute bronchitis   . Need for prophylactic hormone replacement therapy (postmenopausal)   . Degeneration of intervertebral disc, site unspecified   . Allergy, unspecified not elsewhere classified   . Myalgia and myositis, unspecified   . Unspecified essential hypertension   . Other and unspecified hyperlipidemia   . Esophageal reflux   . Diverticulosis of colon (without mention of hemorrhage)   . Unspecified hypothyroidism   . Stroke     mini strokes  . Asthma   . Shortness of breath   . COPD (chronic obstructive pulmonary disease)   . Chronic kidney disease     frequency  . Anxiety    Past Surgical History  Procedure Date  . Breast cystectomy   . Back surgery   . Shoulder surgery   . Knee arthroscopy     Right knee 2012  . Joint replacement     shoulder right   History   Social History  . Marital Status: Married    Spouse Name: N/A    Number of Children: N/A  . Years of Education: N/A   Occupational History  . Housewife    Social History Main Topics  . Smoking status: Former Smoker -- 1.5 packs/day for 10 years    Types: Cigarettes    Quit date: 07/09/1985  . Smokeless tobacco: Never Used   Comment: 1 1/2 ppd x 20 years  . Alcohol Use: No  . Drug Use: No  . Sexually Active: None   Other Topics Concern  . None   Social History Narrative  . None   Family History  Problem Relation Age of Onset  . Lung cancer  Brother     x2  . Hypertension Father   . Heart disease Mother   . Asthma Sister    Allergies  Allergen Reactions  . Sulfadiazine     REACTION: Fever, achey   Prior to Admission medications   Medication Sig Start Date End Date Taking? Authorizing Provider  albuterol (PROAIR HFA) 108 (90 BASE) MCG/ACT inhaler Inhale 2 puffs into the lungs every 6 (six) hours as needed. 08/01/11  Yes Storm Frisk, MD  ALPRAZolam Prudy Feeler) 1 MG tablet Take 0.5 mg by mouth 2 (two) times daily as needed. anxiety   Yes Historical Provider, MD  amLODipine (NORVASC) 2.5 MG tablet Take 1 tablet (2.5 mg total) by mouth daily. 01/30/11  Yes Joaquim Nam, MD  Calcium Carbonate (CALTRATE 600 PO) Take 1 tablet by mouth daily.     Yes Historical Provider, MD  Cholecalciferol (VITAMIN D3) 1000 UNITS CAPS Take 1 capsule by mouth daily.     Yes Historical Provider, MD  DULoxetine (CYMBALTA) 60 MG capsule Take 60 mg by mouth daily.   Yes Historical Provider, MD  fluticasone-salmeterol (ADVAIR HFA) 230-21 MCG/ACT inhaler Inhale 2 puffs into the lungs 2 (two) times daily. 08/01/11  Yes Storm Frisk, MD  levothyroxine (SYNTHROID, LEVOTHROID) [phone]  MCG tablet Take 1 tablet (112 mcg total) by mouth daily. 06/15/11  Yes Joaquim Nam, MD  mometasone (NASONEX) 50 MCG/ACT nasal spray Place 2 sprays into the nose 2 (two) times daily.   Yes Historical Provider, MD  Multiple Vitamin (MULTIVITAMIN) capsule Take 1 capsule by mouth daily.     Yes Historical Provider, MD  oxyCODONE-acetaminophen (PERCOCET) 7.5-325 MG per tablet Take 1-1.5 tablets by mouth every 4 (four) hours as needed. Pain .  Takes 1.5 tabs at bedtime.   Yes Historical Provider, MD  potassium chloride (KLOR-CON) 10 MEQ CR tablet Take 1 tablet (10 mEq total) by mouth daily. 01/31/11  Yes Joaquim Nam, MD  ranitidine (ZANTAC) 150 MG tablet Take 1 tablet (150 mg total) by mouth 2 (two) times daily. 09/13/11  Yes Joaquim Nam, MD  tiotropium (SPIRIVA HANDIHALER) 18  MCG inhalation capsule Place 1 capsule (18 mcg total) into inhaler and inhale daily. 11/16/11 11/15/12 Yes Storm Frisk, MD  vitamin B-12 (CYANOCOBALAMIN) 1000 MCG tablet Take 1,000 mcg by mouth daily.     Yes Historical Provider, MD  zolpidem (AMBIEN) 10 MG tablet Take 1 tablet (10 mg total) by mouth at bedtime. 09/14/11  Yes Joaquim Nam, MD  SUMAtriptan (IMITREX) 50 MG tablet Take 50 mg by mouth every 2 (two) hours as needed. headache    Historical Provider, MD     Positive ROS: All other systems have been reviewed and were otherwise negative with the exception of those mentioned in the HPI and as above.  Physical Exam: General: Alert, no acute distress Cardiovascular: No pedal edema Respiratory: mild cyanosis, mild use of accessory musculature GI: No organomegaly, abdomen is soft and non-tender Skin: No lesions in the area of chief complaint Neurologic: Sensation intact distally Psychiatric: Patient is competent for consent with normal mood and affect Lymphatic: No axillary or cervical lymphadenopathy  MUSCULOSKELETAL: crepitance and limited knee motion.  Assessment: DJD RIGHT KNEE  Plan: Plan for Procedure(s): TOTAL KNEE ARTHROPLASTY  The risks benefits and alternatives were discussed with the patient including but not limited to the risks of nonoperative treatment, versus surgical intervention including infection, bleeding, nerve injury,  blood clots, cardiopulmonary complications, morbidity, mortality, among others, and they were willing to proceed.   Eulas Post, MD 12/11/2011 11:20 AM

## 2011-12-11 NOTE — Discharge Instructions (Signed)
Degenerative Arthritis You have osteoarthritis. This is the wear and tear arthritis that comes with aging. It is also called degenerative arthritis. This is common in people past middle age. It is caused by stress on the joints. The large weight bearing joints of the lower extremities are most often affected. The knees, hips, back, neck, and hands can become painful, swollen, and stiff. This is the most common type of arthritis. It comes on with age, carrying too much weight, or from an injury. Treatment includes resting the sore joint until the pain and swelling improve. Crutches or a walker may be needed for severe flares. Only take over-the-counter or prescription medicines for pain, discomfort, or fever as directed by your caregiver. Local heat therapy may improve motion. Cortisone shots into the joint are sometimes used to reduce pain and swelling during flares. Osteoarthritis is usually not crippling and progresses slowly. There are things you can do to decrease pain:  Avoid high impact activities.   Exercise regularly.   Low impact exercises such as walking, biking and swimming help to keep the muscles strong and keep normal joint function.   Stretching helps to keep your range of motion.   Lose weight if you are overweight. This reduces joint stress.  In severe cases when you have pain at rest or increasing disability, joint surgery may be helpful. See your caregiver for follow-up treatment as recommended.  SEEK IMMEDIATE MEDICAL CARE IF:   You have severe joint pain.   Marked swelling and redness in your joint develops.   You develop a high fever.  Document Released: 06/25/2005 Document Revised: 06/14/2011 Document Reviewed: 11/25/2006 [facility] Patient Information 2012 [place], [place].Total Knee Replacement In total knee replacement, the damaged knee is replaced with an artificial knee joint (prosthesis). The purpose of this surgery is to reduce pain and improve your range of motion.  Regaining a near normal range of motion of your knee in the first 3 to 6 weeks after surgery is critical. Generally, these artificial joints last a minimum of 10 years. By that time, about 1 in 10 patients will need another surgery to repair the loose prosthesis. LET YOUR CAREGIVER KNOW ABOUT:   Allergies to food or medicine.   Medicines taken, including vitamins, herbs, eyedrops, over-the-counter medicines, and creams.   Use of steroids (by mouth or creams).   Previous problems with anesthetics or numbing medicines.   History of bleeding problems or blood clots.   Previous surgery.   Other health problems, including diabetes and kidney problems.   Possibility of pregnancy, if this applies.  RISKS AND COMPLICATIONS   Knee pain.   Loss of range of motion of the knee (stiffness) or instability.   Loosening of the prosthesis.   Infection.  BEFORE THE PROCEDURE   If you smoke, quit.   You may need a replacement or addition of blood (transfusion) during this procedure. You may want to donate your own blood for storage several weeks before the procedure. This way, your own blood can be stored and given to you if needed. Talk to your surgeon about this option.   Do not eat or drink anything for as long as directed by your caregiver before the procedure.   You may bathe and brush your teeth before the procedure. Do not swallow the water when brushing your teeth.   Scrub the area to be operated on for 5 minutes in the morning before the procedure. Use special soap if you are directed to do so by  your caregiver.   Take your regular medicines with a small sip of water unless otherwise instructed. Your caregiver will let you know if there are medicines that need to be stopped and for how long.   You should be present 60 minutes before your procedure or as directed by your caregiver.  PROCEDURE  Before surgery, an intravenous (IV) access for giving fluids will be started. You will be  given medicines and/or gas to make you sleep (anesthetic). You may be given medicines in your back with a needle to make you numb from the waist down. Your surgeon will take out any damaged cartilage and bone. He or she will then put in new metal, plastic, or ceramic joint surfaces to restore alignment and function to your knee. AFTER THE PROCEDURE  You will be taken to the recovery area where a nurse will watch and check your progress. You may have a long, narrow tube (catheter) in your bladder after surgery. The catheter helps you empty your bladder (pass your urine). You may have drainage tubes coming out from under the dressing. These tubes attach to a device that removes blood or fluids that gather after surgery. Once you are awake, stable, and taking fluids well, you will be returned to your room. You will receive physical therapy as prescribed by your caregiver. If you do not have help at home, you may need to go to an extended care facility for a few weeks. If you are sent home with a continuous passive motion (CPM) machine, use it as instructed. Document Released: 10/01/2000 Document Revised: 06/14/2011 Document Reviewed: 04/27/2009 [facility] Patient Information 2012 [place], [place].

## 2011-12-11 NOTE — Anesthesia Preprocedure Evaluation (Addendum)
Anesthesia Evaluation  Patient identified by MRN, date of birth, ID band Patient awake    Reviewed: Allergy & Precautions, H&P , NPO status , Patient's Chart, lab work & pertinent test results, reviewed documented beta blocker date and time   History of Anesthesia Complications Negative for: history of anesthetic complications  Airway Mallampati: III TM Distance: >3 FB Neck ROM: Full    Dental  (+) Teeth Intact and Dental Advisory Given   Pulmonary shortness of breath and at rest, asthma , COPD COPD inhaler and oxygen dependent,          Cardiovascular hypertension, Pt. on medications     Neuro/Psych  Headaches, Anxiety  Neuromuscular disease CVA, No Residual Symptoms    GI/Hepatic Neg liver ROS, GERD-  Medicated and Controlled,  Endo/Other  Hypothyroidism   Renal/GU negative Renal ROS  negative genitourinary   Musculoskeletal negative musculoskeletal ROS (+)   Abdominal   Peds negative pediatric ROS (+)  Hematology   Anesthesia Other Findings   Reproductive/Obstetrics negative OB ROS                          Anesthesia Physical Anesthesia Plan  ASA: III  Anesthesia Plan: General   Post-op Pain Management:    Induction: Intravenous  Airway Management Planned: Oral ETT  Additional Equipment:   Intra-op Plan:   Post-operative Plan: Extubation in OR  Informed Consent: I have reviewed the patients History and Physical, chart, labs and discussed the procedure including the risks, benefits and alternatives for the proposed anesthesia with the patient or authorized representative who has indicated his/her understanding and acceptance.     Plan Discussed with: CRNA, Anesthesiologist and Surgeon  Anesthesia Plan Comments:         Anesthesia Quick Evaluation

## 2011-12-11 NOTE — Transfer of Care (Signed)
Immediate Anesthesia Transfer of Care Note  Patient: [name]  Procedure(s) Performed: Procedure(s) (LRB): TOTAL KNEE ARTHROPLASTY (Right)  Patient Location: PACU  Anesthesia Type: GA combined with regional for post-op pain  Level of Consciousness: awake, alert  and oriented  Airway & Oxygen Therapy: Patient Spontanous Breathing and Patient connected to nasal cannula oxygen  Post-op Assessment: Report given to PACU RN and Post -op Vital signs reviewed and stable  Post vital signs: Reviewed and stable  Complications: No apparent anesthesia complications

## 2011-12-11 NOTE — Op Note (Signed)
DATE OF SURGERY:  12/11/2011 TIME: 2:03 PM  PATIENT NAME:  [name]   AGE: 78 y.o.    PRE-OPERATIVE DIAGNOSIS:  DJD RIGHT KNEE  POST-OPERATIVE DIAGNOSIS:  Same  PROCEDURE:  Procedure(s): TOTAL KNEE ARTHROPLASTY   SURGEON:  Eulas Post, MD   ASSISTANT:  Skip Mayer, PA-C, present and scrubbed throughout the case critical for assistance with exposure, retraction, instrumentation, and closure.   OPERATIVE IMPLANTS: Depuy PFC Sigma, Posterior Stabilized.  Femur size 4 narrow, Tibia size 4, Patella size 35 mm 3-peg oval button, with a 10 mm polyethylene insert.   PREOPERATIVE INDICATIONS:  [name] is a 78 y.o. year old female with end stage bone on bone degenerative arthritis of the knee who failed conservative treatment, including injections, antiinflammatories, activity modification, and assistive devices, and had significant impairment of their activities of daily living, and elected for Total Knee Arthroplasty. She had a previous knee arthroscopy that demonstrated end-stage degenerative changes particularly in the lateral compartment.  The risks, benefits, and alternatives were discussed at length including but not limited to the risks of infection, bleeding, nerve injury, stiffness, blood clots, the need for revision surgery, cardiopulmonary complications, among others, and they were willing to proceed.   OPERATIVE DESCRIPTION:  The patient was brought to the operative room and placed in a supine position.  General anesthesia was administered.  IV antibiotics were given.  The lower extremity was prepped and draped in the usual sterile fashion.  Time out was performed.  The leg was elevated and exsanguinated and the tourniquet was inflated. Total tourniquet time was approximately one hour and 10 minutes, and it was released after complete closure.  Anterior quadriceps tendon splitting approach was performed.  The patella was everted and osteophytes were removed.   The anterior horn of the medial and lateral meniscus was removed.   The distal femur was opened with the drill and the intramedullary distal femoral cutting jig was utilized, set at 5 degrees resecting 10 mm off the distal femur.  Care was taken to protect the collateral ligaments. The distal lateral condyle was somewhat hypoplastic.  Then the extramedullary tibial cutting jig was utilized making the appropriate cut using the anterior tibial crest as a reference building in appropriate posterior slope.  Care was taken during the cut to protect the medial and collateral ligaments.  The proximal tibia was removed along with the posterior horns of the menisci.  The PCL was sacrificed.    The extensor gap was measured and was approximately 10mm.    The distal femoral sizing jig was applied, taking care to avoid notching.  Then the 4-in-1 cutting jig was applied and the anterior and posterior femur was cut, along with the chamfer cuts.  All posterior osteophytes were removed.  The flexion gap was then measured and was symmetric with the extension gap.  I completed the distal femoral preparation using the appropriate jig to prepare the box.  The patella was then measured, and cut with the saw.    The proximal tibia sized and prepared accordingly with the reamer and the punch, and then all components were trialed with the 10mm poly insert.  The knee was found to have excellent balance and full motion.  The patella had a tendency to stay bound laterally, although with a towel clip it tracked well.  The above named components were then cemented into place and all excess cement was removed.  The trial polyethylene component was in place during cementation, and then  was exchanged for the real polyethylene component.    The knee was easily taken through a range of motion and the patella tracked well and the knee irrigated copiously and the parapatellar and subcutaneous tissue closed with vicryl, and monocryl  with steri strips for the skin.  The wounds were injected with marcaine, and dressed with sterile gauze and the tourniquet released and the patient was awakened and returned to the PACU in stable and satisfactory condition.  There were no complications.  Total tourniquet time was approximately 110 minutes.

## 2011-12-12 ENCOUNTER — Encounter (HOSPITAL_COMMUNITY): Payer: Self-pay | Admitting: Orthopedic Surgery

## 2011-12-12 LAB — BASIC METABOLIC PANEL
BUN: 11 mg/dL (ref 6–23)
CO2: 26 mEq/L (ref 19–32)
Calcium: 8.9 mg/dL (ref 8.4–10.5)
Chloride: 98 mEq/L (ref 96–112)
Creatinine, Ser: 0.58 mg/dL (ref 0.50–1.10)
GFR calc Af Amer: >90 mL/min (ref >90)
GFR calc non Af Amer: 86 mL/min — ABNORMAL LOW (ref >90)
Glucose, Bld: 141 mg/dL — ABNORMAL HIGH (ref 70–99)
Potassium: 4.2 mEq/L (ref 3.5–5.1)
Sodium: 136 mEq/L (ref 135–145)

## 2011-12-12 LAB — CBC
HCT: 27.5 % — ABNORMAL LOW (ref 36.0–46.0)
Hemoglobin: 9.1 g/dL — ABNORMAL LOW (ref 12.0–15.0)
MCH: 31 pg (ref 26.0–34.0)
MCHC: 33.1 g/dL (ref 30.0–36.0)
MCV: 93.5 fL (ref 78.0–100.0)
Platelets: 201 10*3/uL (ref 150–400)
RBC: 2.94 MIL/uL — ABNORMAL LOW (ref 3.87–5.11)
RDW: 14.1 % (ref 11.5–15.5)
WBC: 9.3 10*3/uL (ref 4.0–10.5)

## 2011-12-12 LAB — PROTIME-INR
INR: 1.14 (ref 0.00–1.49)
Prothrombin Time: 14.8 seconds (ref 11.6–15.2)

## 2011-12-12 LAB — GLUCOSE, CAPILLARY: Glucose-Capillary: 143 mg/dL — ABNORMAL HIGH (ref 70–99)

## 2011-12-12 MED ORDER — WARFARIN SODIUM 5 MG PO TABS
5.0000 mg | ORAL_TABLET | Freq: Once | ORAL | Status: AC
  Administered 2011-12-12: 5 mg via ORAL
  Filled 2011-12-12: qty 1

## 2011-12-12 NOTE — Progress Notes (Signed)
Patient pulled out IV in left hand.  This is the second IV patient has pulled out since admission to 5000.  For patient safety, IV was not restarted at this time.  Patient is drinking adequate PO. BUN and Cr WDL.   [name] N

## 2011-12-12 NOTE — Progress Notes (Signed)
ANTICOAGULATION CONSULT NOTE - Follow Up Consult  Pharmacy Consult for Coumadin Indication: VTE prophylaxis  s/p R TKA    Allergies  Allergen Reactions  . Sulfadiazine     REACTION: Fever, achey    Patient Measurements: Height: 5\' 1"  (154.9 cm) Weight: 205 lb 14.6 oz (93.4 kg) IBW/kg (Calculated) : 47.8    Vital Signs: Temp: 98.3 F (36.8 C) (06/05 0631) BP: 139/94 mmHg (06/05 0631) Pulse Rate: 83  (06/05 0631)  Labs:  Basename 12/12/11 0550 12/11/11 1001  HGB 9.1* --  HCT 27.5* --  PLT 201 --  APTT -- 39*  LABPROT 14.8 14.7  INR 1.14 1.13  HEPARINUNFRC -- --  CREATININE 0.58 --  CKTOTAL -- --  CKMB -- --  TROPONINI -- --    Estimated Creatinine Clearance: 60.4 ml/min (by C-G formula based on Cr of 0.58).    Assessment: 78 y.o. Female on coumadin for VTE prophylaxis s/p R TKA. Baseline INR 1.13. Also on Lovenox 30mg  SQ q12h until INR >/= 1.8  todays INR 1.14  After 5 mg dose    Goal of Therapy:  INR 2-3 Monitor platelets by anticoagulation protocol: Yes   Plan:  1. Coumadin 5 mg po today  2. Daily PT/INR   Lucille Passy 12/12/2011,10:26 AM

## 2011-12-12 NOTE — Progress Notes (Signed)
OT Note Order received, chart reviewed. Pt currently requiring +2 A with PT and d/c plan is for snf. Will sign off and defer OT eval to that venue.  Garrel Ridgel, OTR/L  Pager [phone] 12/12/2011

## 2011-12-12 NOTE — Progress Notes (Signed)
UR COMPLETED  

## 2011-12-12 NOTE — Progress Notes (Signed)
     Subjective:  Patient reports pain as moderate.  Had difficulty sleeping last night.  Some confusion as well.    Objective:   VITALS:   Filed Vitals:   12/11/11 2153 12/12/11 0631 12/12/11 0810 12/12/11 1409  BP: 108/64 139/94  118/81  Pulse: 84 83  83  Temp: 97.4 F (36.3 C) 98.3 F (36.8 C)  98.3 F (36.8 C)  TempSrc:      Resp: 16 16  18   Height:      Weight:      SpO2: 97% 97% 96% 96%    Neurologically intact Dorsiflexion/Plantar flexion intact Incision: dressing C/D/I  LABS  Results for orders placed during the hospital encounter of 12/11/11 (from the past 24 hour(s))  PROTIME-INR     Status: Normal   Collection Time   12/12/11  5:50 AM      Component Value Range   Prothrombin Time 14.8  11.6 - 15.2 (seconds)   INR 1.14  0.00 - 1.49   CBC     Status: Abnormal   Collection Time   12/12/11  5:50 AM      Component Value Range   WBC 9.3  4.0 - 10.5 (K/uL)   RBC 2.94 (*) 3.87 - 5.11 (MIL/uL)   Hemoglobin 9.1 (*) 12.0 - 15.0 (g/dL)   HCT 16.1 (*) 09.6 - 46.0 (%)   MCV 93.5  78.0 - 100.0 (fL)   MCH 31.0  26.0 - 34.0 (pg)   MCHC 33.1  30.0 - 36.0 (g/dL)   RDW 04.5  40.9 - 81.1 (%)   Platelets 201  150 - 400 (K/uL)  BASIC METABOLIC PANEL     Status: Abnormal   Collection Time   12/12/11  5:50 AM      Component Value Range   Sodium 136  135 - 145 (mEq/L)   Potassium 4.2  3.5 - 5.1 (mEq/L)   Chloride 98  96 - 112 (mEq/L)   CO2 26  19 - 32 (mEq/L)   Glucose, Bld 141 (*) 70 - 99 (mg/dL)   BUN 11  6 - 23 (mg/dL)   Creatinine, Ser 9.14  0.50 - 1.10 (mg/dL)   Calcium 8.9  8.4 - 78.2 (mg/dL)   GFR calc non Af Amer 86 (*) >90 (mL/min)   GFR calc Af Amer >90  >90 (mL/min)  GLUCOSE, CAPILLARY     Status: Abnormal   Collection Time   12/12/11 11:26 AM      Component Value Range   Glucose-Capillary 143 (*) 70 - 99 (mg/dL)   Comment 1 Notify RN      X-ray Knee Right Port  12/11/2011  *RADIOLOGY REPORT*  Clinical Data: Radiograph.  Right knee arthroplasty.  PORTABLE  RIGHT KNEE - 1-2 VIEW  Comparison: 04/13/2011 MRI.  Findings: New uncomplicated right three-part total knee arthroplasty.  Expected postoperative changes in the soft tissues. Fluid distends the suprapatellar pouch and soft tissues anterior to the femur.  IMPRESSION: Uncomplicated new right total knee arthroplasty.  Original Report Authenticated By: Andreas Newport, M.D.    Assessment/Plan: 1 Day Post-Op   Principal Problem:  *Osteoarthritis of right knee  ABLA observe.  No current symptoms.  Advance diet Up with therapy Discharge to SNF   Shanyia Stines P 12/12/2011, 6:56 PM   Teryl Lucy, MD [phone] pager

## 2011-12-12 NOTE — Progress Notes (Signed)
Physical Therapy Evaluation Note  Past Medical History  Diagnosis Date  . Osteoporosis, unspecified   . Allergic rhinitis, cause unspecified   . Other chronic pain   . Other abnormal blood chemistry   . Insomnia, unspecified   . Acute bronchitis   . Need for prophylactic hormone replacement therapy (postmenopausal)   . Degeneration of intervertebral disc, site unspecified   . Allergy, unspecified not elsewhere classified   . Myalgia and myositis, unspecified   . Unspecified essential hypertension   . Other and unspecified hyperlipidemia   . Esophageal reflux   . Diverticulosis of colon (without mention of hemorrhage)   . Unspecified hypothyroidism   . Stroke     mini strokes  . Asthma   . Shortness of breath   . COPD (chronic obstructive pulmonary disease)   . Chronic kidney disease     frequency  . Anxiety   . Osteoarthritis of right knee 08/27/2011    R knee pain, s/p arthroscopy 2012 per [name]     Past Surgical History  Procedure Date  . Breast cystectomy   . Back surgery   . Shoulder surgery   . Knee arthroscopy     Right knee 2012  . Joint replacement     shoulder right     12/12/11 0905  PT Visit Information  Last PT Received On 12/12/11  Assistance Needed +2  PT Time Calculation  PT Start Time 0905  PT Stop Time 0942  PT Time Calculation (min) 37 min  Subjective Data  Subjective Pt received supine in bed in CPM with c/o 9/10 R knee pain.  Precautions  Precautions Knee  Required Braces or Orthoses Knee Immobilizer - Right  Knee Immobilizer - Right On except when in CPM  Restrictions  Weight Bearing Restrictions Yes  RLE Weight Bearing WBAT  Home Living  Lives With Spouse  Available Help at Discharge (husband in poor health)  Additional Comments pt going to [facility] upon d/c.  Prior Function  Level of Independence Independent with assistive device(s) (RW)  Able to Take Stairs? No  Driving No  Vocation Retired  Comments pt used RW  prior to surgery  Geneticist, molecular HOH  Cognition  Overall Cognitive Status Appears within functional limits for tasks assessed/performed  Arousal/Alertness Awake/alert  Orientation Level Appears intact for tasks assessed  Behavior During Session Anxious  Cognition - Other Comments HOH limiting comprehension and anxiety inhibiting processing/safety awareness  Right Upper Extremity Assessment  RUE ROM/Strength/Tone WFL  Left Upper Extremity Assessment  LUE ROM/Strength/Tone WFL  Right Lower Extremity Assessment  RLE ROM/Strength/Tone Deficits;Due to pain;Due to precautions  RLE ROM/Strength/Tone Deficits minimal quad set, R knee active flex 20 deg, AA 50 deg  Left Lower Extremity Assessment  LLE ROM/Strength/Tone WFL  Trunk Assessment  Trunk Assessment Normal  Bed Mobility  Bed Mobility Supine to Sit  Supine to Sit 4: Min assist;Not tested (comment);HOB elevated  Details for Bed Mobility Assistance max directional verbal cues, increased time required, minA for R LE management, minA to scoot to EOB  Transfers  Transfers Sit to Stand;Stand to Sit  Sit to Stand 1: +2 Total assist;From bed (up to RW)  Sit to Stand: Patient Percentage 40%  Stand to Sit 3: Mod assist;With upper extremity assist;To chair/3-in-1  Details for Transfer Assistance max v/c's for safety, technique, and hand placement. Pt anxious re: falling.  Ambulation/Gait  Ambulation/Gait Assistance 3: Mod assist  Ambulation Distance (Feet) 5 Feet  Assistive device Rolling walker  Ambulation/Gait Assistance Details pt on 3LO2 via [place], + SOB, max step by step v/c's for sequencing. pt with 2 episodes of knee buckling but able to maintain upright position  Gait Pattern Step-to pattern;Decreased step length - right;Decreased stance time - right;Antalgic  Gait velocity extremely slow  Stairs No  Balance  Balance Assessed Yes  Static Standing Balance  Static Standing - Balance Support Bilateral upper extremity  supported  Static Standing - Level of Assistance 2: Max assist  Static Standing - Comment/# of Minutes pt with postitive R lateral lean requiring maxA to maintain balance until patient able to focus and understand to increase L LE WBing  Exercises  Exercises Total Joint (handout provided)  Total Joint Exercises  Ankle Circles/Pumps AROM;Both;Supine  Quad Sets AROM;Right;AAROM;10 reps;Supine  Heel Slides AAROM;Right;10 reps;Supine  Goniometric ROM 15 deg active R Knee flex, 30 deg AA flex  PT - End of Session  Equipment Utilized During Treatment Gait belt;Right knee immobilizer  Activity Tolerance Patient limited by fatigue;Patient limited by pain  Patient left in chair;with call bell/phone within reach;with family/visitor present  Nurse Communication Mobility status;Weight bearing status  PT Assessment  Clinical Impression Statement Pt s/p R TKA presenting with increased R knee pain, decreased R LE strength, R knee ROM, decreased activity tolerance, and extremely HOH. Patient to strongly benefit from SNF placement upon d/c due to spouse unable to provide greater than supervision to assist patient. patient currently requires minimum of mod-maxA for all mobility.   PT Recommendation/Assessment Patient needs continued PT services  PT Problem List Decreased strength;Decreased range of motion;Decreased activity tolerance;Decreased balance;Decreased mobility;Decreased knowledge of use of DME;Decreased safety awareness  Barriers to Discharge Decreased caregiver support  PT Therapy Diagnosis  Difficulty walking;Abnormality of gait;Generalized weakness;Acute pain  PT Plan  PT Frequency 7X/week  PT Treatment/Interventions DME instruction;Gait training;Stair training;Functional mobility training;Therapeutic activities;Therapeutic exercise  PT Recommendation  Follow Up Recommendations Skilled nursing facility  Equipment Recommended Defer to next venue  Individuals Consulted  Consulted and Agree with  Results and Recommendations Patient  Acute Rehab PT Goals  PT Goal Formulation With patient  Time For Goal Achievement 12/26/11  Potential to Achieve Goals Good  Pt will go Supine/Side to Sit with supervision;with HOB 0 degrees  PT Goal: Supine/Side to Sit - Progress Goal set today  Pt will go Sit to Stand with supervision;with upper extremity assist (up to RW.)  PT Goal: Sit to Stand - Progress Goal set today  Pt will Transfer Bed to Chair/Chair to Bed with supervision (with RW)  PT Transfer Goal: Bed to Chair/Chair to Bed - Progress Goal set today  Pt will Ambulate 51 - 150 feet;with supervision;with rolling walker  PT Goal: Ambulate - Progress Goal set today  Pt will Perform Home Exercise Program Independently  PT Goal: Perform Home Exercise Program - Progress Goal set today  Written Expression  Dominant Hand Right    Pain: 9/10 R knee pain  Lewis Shock, PT, DPT Pager #: [phone] Office #: [phone]

## 2011-12-13 DIAGNOSIS — D62 Acute posthemorrhagic anemia: Secondary | ICD-10-CM | POA: Diagnosis not present

## 2011-12-13 DIAGNOSIS — R0902 Hypoxemia: Secondary | ICD-10-CM

## 2011-12-13 DIAGNOSIS — J45909 Unspecified asthma, uncomplicated: Secondary | ICD-10-CM

## 2011-12-13 LAB — CBC
HCT: 25.8 % — ABNORMAL LOW (ref 36.0–46.0)
Hemoglobin: 8.3 g/dL — ABNORMAL LOW (ref 12.0–15.0)
MCH: 30.1 pg (ref 26.0–34.0)
MCHC: 32.2 g/dL (ref 30.0–36.0)
MCV: 93.5 fL (ref 78.0–100.0)
Platelets: 167 10*3/uL (ref 150–400)
RBC: 2.76 MIL/uL — ABNORMAL LOW (ref 3.87–5.11)
RDW: 14.5 % (ref 11.5–15.5)
WBC: 9 10*3/uL (ref 4.0–10.5)

## 2011-12-13 LAB — PROTIME-INR
INR: 1.67 — ABNORMAL HIGH (ref 0.00–1.49)
Prothrombin Time: 20 seconds — ABNORMAL HIGH (ref 11.6–15.2)

## 2011-12-13 MED ORDER — WARFARIN SODIUM 3 MG PO TABS
3.0000 mg | ORAL_TABLET | Freq: Once | ORAL | Status: DC
  Filled 2011-12-13: qty 1

## 2011-12-13 NOTE — Clinical Social Work Placement (Addendum)
    Clinical Social Work Department CLINICAL SOCIAL WORK PLACEMENT NOTE 12/13/2011  Patient:  [name], [name]  Account Number:  [id number] Admit date:  12/11/2011  Clinical Social Worker:  Lupita Leash Louann Hopson, BSW  Date/time:  12/12/2011 02:00 PM  Clinical Social Work is seeking post-discharge placement for this patient at the following level of care:   SKILLED NURSING   (*CSW will update this form in Epic as items are completed)     Patient/family provided with [facility] Department of Clinical Social Work's list of facilities offering this level of care within the geographic area requested by the patient (or if unable, by the patient's family).    Patient/family informed of their freedom to choose among providers that offer the needed level of care, that participate in Medicare, Medicaid or managed care program needed by the patient, have an available bed and are willing to accept the patient.    Patient/family informed of [facility]' ownership interest in [facility], as well as of the fact that they are under no obligation to receive care at this facility.  PASARR submitted to EDS on 12/13/2011  PASARR number received from EDS on 12/13/2011  FL2 transmitted to all facilities in geographic area requested by pt/family on  12/12/2011 FL2 transmitted to all facilities within larger geographic area on   Patient informed that his/her managed care company has contracts with or will negotiate with  certain facilities, including the following:   Pt. referred to [facility]- as per request of patient. They are aware of patient and have an available bed when stable. Patient has an old PASARR number; SNF list not provided due to above.     Patient/family informed of bed offers received:  12/12/2011 Patient chooses bed at [facility] Physician recommends and patient chooses bed at    Patient to be transferred to [facility] on   Patient to be transferred to facility by [facility]  The  following physician request were entered in Epic:   Additional Comments: Patient and husband are very pleased with d/c plan.  DC 12/14/2011. Notified SNF, patient and nursing of d/c plan.

## 2011-12-13 NOTE — Consult Note (Signed)
Patient: [name] DOB: Sep 15, 1930 Date of Admission: 12/11/2011            Pulmonary consult  Date of Consult: 12/13/2011 MD requesting consult:  Dion Saucier (ortho) Reason for consult: asthma, post op pulm needs  Primary pulmonary: Dr. Delford Field   HPI - 78yo female with hx asthma on 3L [place] home O2, CKD, GERD admitted 6/4 for planned R total knee replacement.  Case was uncomplicated and PCCM called for post op pulmonary needs and eval rx prior to short term SNF placement.    Allergies:  Allergies  Allergen Reactions  . Sulfadiazine     REACTION: Fever, achey     PMH: Past Medical History  Diagnosis Date  . Osteoporosis, unspecified   . Allergic rhinitis, cause unspecified   . Other chronic pain   . Other abnormal blood chemistry   . Insomnia, unspecified   . Acute bronchitis   . Need for prophylactic hormone replacement therapy (postmenopausal)   . Degeneration of intervertebral disc, site unspecified   . Allergy, unspecified not elsewhere classified   . Myalgia and myositis, unspecified   . Unspecified essential hypertension   . Other and unspecified hyperlipidemia   . Esophageal reflux   . Diverticulosis of colon (without mention of hemorrhage)   . Unspecified hypothyroidism   . Stroke     mini strokes  . Asthma   . Shortness of breath   . COPD (chronic obstructive pulmonary disease)   . Chronic kidney disease     frequency  . Anxiety   . Osteoarthritis of right knee 08/27/2011    R knee pain, s/p arthroscopy 2012 per [name]     Home meds: Medications Prior to Admission  Medication Sig Dispense Refill  . albuterol (PROAIR HFA) 108 (90 BASE) MCG/ACT inhaler Inhale 2 puffs into the lungs every 6 (six) hours as needed.  1 Inhaler  6  . ALPRAZolam (XANAX) 1 MG tablet Take 0.5 mg by mouth 2 (two) times daily as needed. anxiety      . amLODipine (NORVASC) 2.5 MG tablet Take 1 tablet (2.5 mg total) by mouth daily.  90 tablet  3  . Calcium Carbonate (CALTRATE 600 PO)  Take 1 tablet by mouth daily.        . Cholecalciferol (VITAMIN D3) 1000 UNITS CAPS Take 1 capsule by mouth daily.        . DULoxetine (CYMBALTA) 60 MG capsule Take 60 mg by mouth daily.      . fluticasone-salmeterol (ADVAIR HFA) 230-21 MCG/ACT inhaler Inhale 2 puffs into the lungs 2 (two) times daily.  2 Inhaler  6  . levothyroxine (SYNTHROID, LEVOTHROID) 112 MCG tablet Take 1 tablet (112 mcg total) by mouth daily.  90 tablet  3  . mometasone (NASONEX) 50 MCG/ACT nasal spray Place 2 sprays into the nose 2 (two) times daily.      . Multiple Vitamin (MULTIVITAMIN) capsule Take 1 capsule by mouth daily.        Marland Kitchen oxyCODONE-acetaminophen (PERCOCET) 7.5-325 MG per tablet Take 1-1.5 tablets by mouth every 4 (four) hours as needed. Pain .  Takes 1.5 tabs at bedtime.      . potassium chloride (KLOR-CON) 10 MEQ CR tablet Take 1 tablet (10 mEq total) by mouth daily.  90 tablet  3  . ranitidine (ZANTAC) 150 MG tablet Take 1 tablet (150 mg total) by mouth 2 (two) times daily.  180 tablet  3  . tiotropium (SPIRIVA HANDIHALER) 18 MCG inhalation capsule Place 1  capsule (18 mcg total) into inhaler and inhale daily.  30 capsule  11  . vitamin B-12 (CYANOCOBALAMIN) 1000 MCG tablet Take 1,000 mcg by mouth daily.        Marland Kitchen zolpidem (AMBIEN) 10 MG tablet Take 1 tablet (10 mg total) by mouth at bedtime.  30 tablet  5  . SUMAtriptan (IMITREX) 50 MG tablet Take 50 mg by mouth every 2 (two) hours as needed. headache         Social Hx: History   Social History  . Marital Status: Married    Spouse Name: N/A    Number of Children: N/A  . Years of Education: N/A   Occupational History  . Housewife    Social History Main Topics  . Smoking status: Former Smoker -- 1.5 packs/day for 10 years    Types: Cigarettes    Quit date: 07/09/1985  . Smokeless tobacco: Never Used   Comment: 1 1/2 ppd x 20 years  . Alcohol Use: No  . Drug Use: No  . Sexually Active: Not on file   Other Topics Concern  . Not on file    Social History Narrative  . No narrative on file     Family Hx: Family History  Problem Relation Age of Onset  . Lung cancer Brother     x2  . Hypertension Father   . Heart disease Mother   . Asthma Sister      ROS: C/o R knee pain.  Denies SOB, cough, chest pain.  All other systems reviewed and were neg.   Filed Vitals:   12/12/11 0810 12/12/11 1409 12/12/11 2134 12/13/11 0613  BP:  118/81 105/78 110/82  Pulse:  83 90 88  Temp:  98.3 F (36.8 C) 99.2 F (37.3 C) 98.6 F (37 C)  TempSrc:      Resp:  18 20 20   Height:      Weight:      SpO2: 96% 96% 99% 100%    chest X-ray none  CBC    Component Value Date/Time   WBC 9.0 12/13/2011 0525   RBC 2.76* 12/13/2011 0525   HGB 8.3* 12/13/2011 0525   HCT 25.8* 12/13/2011 0525   PLT 167 12/13/2011 0525   MCV 93.5 12/13/2011 0525   MCH 30.1 12/13/2011 0525   MCHC 32.2 12/13/2011 0525   RDW 14.5 12/13/2011 0525   LYMPHSABS 2.1 08/25/2009 1021   MONOABS 0.5 08/25/2009 1021   EOSABS 0.2 08/25/2009 1021   BASOSABS 0.0 08/25/2009 1021     BMET    Component Value Date/Time   NA 136 12/12/2011 0550   K 4.2 12/12/2011 0550   CL 98 12/12/2011 0550   CO2 26 12/12/2011 0550   GLUCOSE 141* 12/12/2011 0550   BUN 11 12/12/2011 0550   CREATININE 0.58 12/12/2011 0550   CALCIUM 8.9 12/12/2011 0550   GFRNONAA 86* 12/12/2011 0550   GFRAA >90 12/12/2011 0550     ABG    Component Value Date/Time   PHART 7.353 04/09/2008 1144   PCO2ART 59.2* 04/09/2008 1144   PO2ART 89.0 04/09/2008 1144   HCO3 32.9* 04/09/2008 1144   TCO2 32 04/09/2008 1145   O2SAT 96.0 04/09/2008 1144      EXAM: General: pleasant female, NAD  Neuro: awake, alert, intermittently confused CV: s1s2 rrr PULM: resps even non labored on 3L [place], diminished bases, otherwise cta, no audible wheeze  GI: abd soft, +bs Extremities: R knee wrapped, in CPM machine s/p tkr  IMPRESSION/ PLAN:  Asthmatic bronchitis - without acute exacerbation.  Post op total knee replacement.  Has been doing well  as outpt on Advair, Spiriva, PRN albuterol.  Also likely some component anxiety which appears well controlled with PRN xanax.   PLAN -  Aggressive pulmonary hygiene post op Cont current rx now and on d/c to SNF No exacerbation, no role steroids F/u with Dr. Delford Field as outpt --> 02/08/12 @ 1:45pm  Wean O2 as able to keep sats >90% -- baseline 3L [place]    WHITEHEART,KATHRYN, NP 12/13/2011  8:09 AM Pager: [phone]  *Care during the described time interval was provided by me and/or other providers on the critical care team. I have reviewed this patient's available data, including medical history, events of note, physical examination and test results as part of my evaluation.     Seen on CCM rounds this morning with NP above.  Pt examined and database reviewed. I agree with above findings, assessment and plan as reflected in the note above.   Billy Fischer, MD;  PCCM service; Mobile [phone]

## 2011-12-13 NOTE — Clinical Social Work Psychosocial (Signed)
     Clinical Social Work Department BRIEF PSYCHOSOCIAL ASSESSMENT 12/13/2011  Patient:  JULIETTE, STANDRE     Account Number:  [id number]     Admit date:  12/11/2011  Clinical Social Worker:  Burnard Hawthorne  Date/Time:  12/12/2011 10:00 AM  Referred by:  Physician  Date Referred:  12/12/2011 Referred for  SNF Placement   Other Referral:   Interview type:  Other - See comment Other interview type:   Met with patient and husband.    PSYCHOSOCIAL DATA Living Status:  HUSBAND Admitted from facility:   Level of care:   Primary support name:  Dillyn Joaquin - [phone] Primary support relationship to patient:   Degree of support available:   Strong    CURRENT CONCERNS Current Concerns  Post-Acute Placement   Other Concerns:    SOCIAL WORK ASSESSMENT / PLAN CSW recevied referral to assist patient with short term SNF placement.  Met with patient and husband- they are requesting short term SNF at [facility] and have already spoken to facility.  SNF is recommended by PT/MD. Dorena Cookey at [facility]- she is aware of patient and plans admission to facility when medically stable. They will have a bed available for patient on 12/14/11. Will send FL2 and referral information to SNF   Assessment/plan status:  Psychosocial Support/Ongoing Assessment of Needs Other assessment/ plan:   Information/referral to community resources:   SNF list offered and declined as they have already chosen a SNF unit.  Emotional support offered to husband.  Provided husband with phone numbers to [facility] and admission staff.    PATIENTS/FAMILYS RESPONSE TO PLAN OF CARE: Patient and husband are very pleased with d/c plan and will await d/c and stability per MD.

## 2011-12-13 NOTE — Progress Notes (Signed)
PT Progress Note:     12/13/11 1000  PT Visit Information  Last PT Received On 12/13/11  Assistance Needed +2  PT Time Calculation  PT Start Time 0951  PT Stop Time 1019  PT Time Calculation (min) 28 min  Subjective Data  Subjective "I cant do this"  Precautions  Precautions Knee  Required Braces or Orthoses Knee Immobilizer - Right  Knee Immobilizer - Right On except when in CPM  Restrictions  RLE Weight Bearing WBAT  Cognition  Overall Cognitive Status Impaired  Area of Impairment Attention;Following commands;Safety/judgement;Problem solving  Arousal/Alertness Lethargic  Orientation Level Oriented X4 / Intact  Behavior During Session Lethargic  Following Commands Follows one step commands inconsistently  Safety/Judgement Decreased safety judgement for tasks assessed;Decreased awareness of safety precautions  Problem Solving Required step-by-step cueing for technique during bed mobility & OOB activity.    Cognition - Other Comments HOH limiting comprehension and anxiety inhibiting processing/safety awareness  Bed Mobility  Bed Mobility Supine to Sit;Sitting - Scoot to Edge of Bed  Supine to Sit 1: +2 Total assist;With rails;HOB elevated (HOB elevated ~30 degrees. )  Supine to Sit: Patient Percentage 40%  Sitting - Scoot to Edge of Bed 2: Max assist  Details for Bed Mobility Assistance Continues to require max directional cues for technique & increased time.  Required increased (A) to transition from supine>sit>sitting EOB today compared to previous PT session.  (A) needed for LE's & to bring shoulders/trunk to sitting upright.  Pt very lethargic & required max cueing to increase active particiation.    Transfers  Transfers Sit to Stand;Stand to Sit  Sit to Stand 1: +2 Total assist;With upper extremity assist;From bed  Sit to Stand: Patient Percentage 30%  Stand to Sit 1: +2 Total assist;Without upper extremity assist;To chair/3-in-1  Stand to Sit: Patient Percentage 30%    Details for Transfer Assistance Cont's to require max cues for safety, initiation, hand placement, & technique.  (A) to achieve standing, anterior translation of trunk over BOS, upright posture, balance, position hands in proper place on RW, & control descent to recliner.   Pt went from stand>sit with hands on RW & poor control despite max cues for safe technique.  After sitting pt states "Y'all scared me.  I thought I was going to fall"  Ambulation/Gait  Ambulation/Gait Assistance 1: +2 Total assist  Ambulation/Gait: Patient Percentage 60%  Ambulation Distance (Feet) 5 Feet  Assistive device Rolling walker  Ambulation/Gait Assistance Details Max cues for sequencing, advancement of RW, tall posture, technique, & safety.  (A) for balance, lateral weight shifting L<>R to advance LE's, manage RW, & safety.     Gait Pattern Step-to pattern;Decreased step length - left;Decreased step length - right;Trunk flexed  Exercises  Exercises Total Joint  Total Joint Exercises  Ankle Circles/Pumps AROM;Both;10 reps  Quad Sets AROM;Both;5 reps;Limitations  Heel Slides AAROM;Right;10 reps  Goniometric ROM 30 degrees AA Flex  Hip ABduction/ADduction AAROM;Right;10 reps  Straight Leg Raises AAROM;Right;10 reps  Quad Sets Limitations pt falling asleep  PT - End of Session  Equipment Utilized During Treatment Gait belt;Right knee immobilizer  Activity Tolerance Patient limited by pain;Other (comment) (limited by lethargy)  Patient left in chair;with call bell/phone within reach  PT - Assessment/Plan  Comments on Treatment Session Pt's progression with mobility seemed to be limited by lethargy & pain today.  Pt required increased Assist for bed mobility & OOB activity today compared to last PT session.    PT Plan Discharge plan remains  appropriate  PT Frequency 7X/week  Follow Up Recommendations Skilled nursing facility  Equipment Recommended Defer to next venue  Acute Rehab PT Goals  Time For Goal  Achievement 12/26/11  Potential to Achieve Goals Good  PT Goal: Supine/Side to Sit - Progress Not progressing  PT Goal: Sit to Stand - Progress Not progressing  PT Goal: Ambulate - Progress Not progressing  PT Goal: Perform Home Exercise Program - Progress Not met     Verdell Face, [place] [phone] 12/13/2011

## 2011-12-13 NOTE — Progress Notes (Signed)
ANTICOAGULATION CONSULT NOTE - Follow Up Consult  Pharmacy Consult for Coumadin Indication: VTE prophylaxis  s/p R TKA   Assessment: 78 y.o. Female on coumadin for VTE prophylaxis s/p R TKA. Also on Lovenox 30mg  SQ q12h until INR >/= 1.8. INR continues to be subtherapeutic but did increase significantly overnight to 1.67 this am. No bleeding issues have been noted. Patient does appear to be rather sensitive to coumadin, no interacting medications on profile, will reduce dose today.  At this early point in dosing, can only estimate patient will need 2.5mg  daily of warfarin to maintain therapeutic INR.  Goal of Therapy:  INR 2-3   Plan:  1. Coumadin 3 mg today  2. Daily PT/INR  Vital Signs: Temp: 98.6 F (37 C) (06/06 0613) BP: 110/82 mmHg (06/06 0613) Pulse Rate: 88  (06/06 0613)  Labs:  Basename 12/13/11 0525 12/12/11 0550 12/11/11 1001  HGB 8.3* 9.1* --  HCT 25.8* 27.5* --  PLT 167 201 --  APTT -- -- 39*  LABPROT 20.0* 14.8 14.7  INR 1.67* 1.14 1.13  HEPARINUNFRC -- -- --  CREATININE -- 0.58 --  CKTOTAL -- -- --  CKMB -- -- --  TROPONINI -- -- --    Estimated Creatinine Clearance: 60.4 ml/min (by C-G formula based on Cr of 0.58).  Severiano Gilbert 12/13/2011,8:49 AM

## 2011-12-13 NOTE — Progress Notes (Signed)
     Subjective:  Patient reports pain as moderate.  She has had some mild confusion and has pulled out her IV. She is now appropriate with me.  Objective:   VITALS:   Filed Vitals:   12/12/11 0810 12/12/11 1409 12/12/11 2134 12/13/11 0613  BP:  118/81 105/78 110/82  Pulse:  83 90 88  Temp:  98.3 F (36.8 C) 99.2 F (37.3 C) 98.6 F (37 C)  TempSrc:      Resp:  18 20 20   Height:      Weight:      SpO2: 96% 96% 99% 100%    Neurologically intact Dorsiflexion/Plantar flexion intact Incision: dressing C/D/I  LABS  Results for orders placed during the hospital encounter of 12/11/11 (from the past 24 hour(s))  GLUCOSE, CAPILLARY     Status: Abnormal   Collection Time   12/12/11 11:26 AM      Component Value Range   Glucose-Capillary 143 (*) 70 - 99 (mg/dL)   Comment 1 Notify RN    PROTIME-INR     Status: Abnormal   Collection Time   12/13/11  5:25 AM      Component Value Range   Prothrombin Time 20.0 (*) 11.6 - 15.2 (seconds)   INR 1.67 (*) 0.00 - 1.49   CBC     Status: Abnormal   Collection Time   12/13/11  5:25 AM      Component Value Range   WBC 9.0  4.0 - 10.5 (K/uL)   RBC 2.76 (*) 3.87 - 5.11 (MIL/uL)   Hemoglobin 8.3 (*) 12.0 - 15.0 (g/dL)   HCT 16.1 (*) 09.6 - 46.0 (%)   MCV 93.5  78.0 - 100.0 (fL)   MCH 30.1  26.0 - 34.0 (pg)   MCHC 32.2  30.0 - 36.0 (g/dL)   RDW 04.5  40.9 - 81.1 (%)   Platelets 167  150 - 400 (K/uL)    X-ray Knee Right Port  12/11/2011  *RADIOLOGY REPORT*  Clinical Data: Radiograph.  Right knee arthroplasty.  PORTABLE RIGHT KNEE - 1-2 VIEW  Comparison: 04/13/2011 MRI.  Findings: New uncomplicated right three-part total knee arthroplasty.  Expected postoperative changes in the soft tissues. Fluid distends the suprapatellar pouch and soft tissues anterior to the femur.  IMPRESSION: Uncomplicated new right total knee arthroplasty.  Original Report Authenticated By: Andreas Newport, M.D.    Assessment/Plan: 2 Days Post-Op   Principal  Problem:  *Osteoarthritis of right knee  Acute blood loss anemia, observe. She is not having any symptoms, and does not have a cardiac history.  Advance diet Up with therapy D/C IV fluids Discharge to SNF   Tegan Britain P 12/13/2011, 9:15 AM   Teryl Lucy, MD [phone] pager

## 2011-12-14 LAB — CBC
HCT: 23.4 % — ABNORMAL LOW (ref 36.0–46.0)
Hemoglobin: 7.6 g/dL — ABNORMAL LOW (ref 12.0–15.0)
MCH: 30.2 pg (ref 26.0–34.0)
MCHC: 32.5 g/dL (ref 30.0–36.0)
MCV: 92.9 fL (ref 78.0–100.0)
Platelets: 178 10*3/uL (ref 150–400)
RBC: 2.52 MIL/uL — ABNORMAL LOW (ref 3.87–5.11)
RDW: 14.4 % (ref 11.5–15.5)
WBC: 8.3 10*3/uL (ref 4.0–10.5)

## 2011-12-14 LAB — PROTIME-INR
INR: 2.39 — ABNORMAL HIGH (ref 0.00–1.49)
Prothrombin Time: 26.5 seconds — ABNORMAL HIGH (ref 11.6–15.2)

## 2011-12-14 NOTE — Discharge Summary (Signed)
Physician Discharge Summary  Patient ID: NEVE BRANSCOMB MRN: [id number] DOB/AGE: 1931-02-12 78 y.o.  Admit date: 12/11/2011 Discharge date: 12/14/2011  Admission Diagnoses:  Osteoarthritis of right knee  Discharge Diagnoses:  Principal Problem:  *Osteoarthritis of right knee Active Problems:  Hypoxemia  Asthmatic bronchitis  Postoperative anemia due to acute blood loss   Past Medical History  Diagnosis Date  . Osteoporosis, unspecified   . Allergic rhinitis, cause unspecified   . Other chronic pain   . Other abnormal blood chemistry   . Insomnia, unspecified   . Acute bronchitis   . Need for prophylactic hormone replacement therapy (postmenopausal)   . Degeneration of intervertebral disc, site unspecified   . Allergy, unspecified not elsewhere classified   . Myalgia and myositis, unspecified   . Unspecified essential hypertension   . Other and unspecified hyperlipidemia   . Esophageal reflux   . Diverticulosis of colon (without mention of hemorrhage)   . Unspecified hypothyroidism   . Stroke     mini strokes  . Asthma   . Shortness of breath   . COPD (chronic obstructive pulmonary disease)   . Chronic kidney disease     frequency  . Anxiety   . Osteoarthritis of right knee 08/27/2011    R knee pain, s/p arthroscopy 2012 per [name]     Surgeries: Procedure(s): TOTAL KNEE ARTHROPLASTY on 12/11/2011   Consultants (if any):    Discharged Condition: Improved  Hospital Course: KYONNA FRIER is an 78 y.o. female who was admitted 12/11/2011 with a diagnosis of Osteoarthritis of right knee and went to the operating room on 12/11/2011 and underwent the above named procedures.    She was given perioperative antibiotics:  Anti-infectives     Start     Dose/Rate Route Frequency Ordered Stop   12/11/11 1800   ceFAZolin (ANCEF) IVPB 2 g/50 mL premix        2 g 100 mL/hr over 30 Minutes Intravenous Every 6 hours 12/11/11 1652 12/12/11 0703   12/10/11 1242   ceFAZolin  (ANCEF) IVPB 2 g/50 mL premix        2 g 100 mL/hr over 30 Minutes Intravenous 60 min pre-op 12/10/11 1242 12/11/11 1207   12/10/11 1241   ceFAZolin (ANCEF) IVPB 1 g/50 mL premix  Status:  Discontinued        1 g 100 mL/hr over 30 Minutes Intravenous 60 min pre-op 12/10/11 1241 12/10/11 1242        .  She was given sequential compression devices, early ambulation, and chemoprophylaxis for DVT prophylaxis.  She benefited maximally from the hospital stay and there were no complications.  She did have postoperative acute blood loss anemia, however was not having significant symptoms, nor Tachycardia. Therefore she was monitored. She maintained appropriate blood pressure, and was able to get up and down to the bedside commode without significant dizziness or orthostatic symptoms.  Recent vital signs:  Filed Vitals:   12/14/11 0609  BP: 97/60  Pulse: 87  Temp: 99.4 F (37.4 C)  Resp: 16    Recent laboratory studies:  Lab Results  Component Value Date   HGB 7.6* 12/14/2011   HGB 8.3* 12/13/2011   HGB 9.1* 12/12/2011   Lab Results  Component Value Date   WBC 8.3 12/14/2011   PLT 178 12/14/2011   Lab Results  Component Value Date   INR 2.39* 12/14/2011   Lab Results  Component Value Date   NA 136 12/12/2011   K 4.2 12/12/2011  CL 98 12/12/2011   CO2 26 12/12/2011   BUN 11 12/12/2011   CREATININE 0.58 12/12/2011   GLUCOSE 141* 12/12/2011    Discharge Medications:   Medication List  As of 12/14/2011  7:28 AM   TAKE these medications         albuterol 108 (90 BASE) MCG/ACT inhaler   Commonly known as: PROVENTIL HFA;VENTOLIN HFA   Inhale 2 puffs into the lungs every 6 (six) hours as needed.      ALPRAZolam 1 MG tablet   Commonly known as: XANAX   Take 0.5 mg by mouth 2 (two) times daily as needed. anxiety      amLODipine 2.5 MG tablet   Commonly known as: NORVASC   Take 1 tablet (2.5 mg total) by mouth daily.      CALTRATE 600 PO   Take 1 tablet by mouth daily.      DULoxetine 60 MG  capsule   Commonly known as: CYMBALTA   Take 60 mg by mouth daily.      fluticasone-salmeterol 230-21 MCG/ACT inhaler   Commonly known as: ADVAIR HFA   Inhale 2 puffs into the lungs 2 (two) times daily.      levothyroxine 112 MCG tablet   Commonly known as: SYNTHROID, LEVOTHROID   Take 1 tablet (112 mcg total) by mouth daily.      methocarbamol 500 MG tablet   Commonly known as: ROBAXIN   Take 1 tablet (500 mg total) by mouth 4 (four) times daily.      mometasone 50 MCG/ACT nasal spray   Commonly known as: NASONEX   Place 2 sprays into the nose 2 (two) times daily.      multivitamin capsule   Take 1 capsule by mouth daily.      oxyCODONE-acetaminophen 7.5-325 MG per tablet   Commonly known as: PERCOCET   Take 1-1.5 tablets by mouth every 4 (four) hours as needed. Pain .  Takes 1.5 tabs at bedtime.      oxyCODONE-acetaminophen 10-325 MG per tablet   Commonly known as: PERCOCET   Take 1-2 tablets by mouth every 6 (six) hours as needed for pain. MAXIMUM TOTAL ACETAMINOPHEN DOSE IS 4000 MG PER DAY      potassium chloride 10 MEQ CR tablet   Commonly known as: KLOR-CON   Take 1 tablet (10 mEq total) by mouth daily.      ranitidine 150 MG tablet   Commonly known as: ZANTAC   Take 1 tablet (150 mg total) by mouth 2 (two) times daily.      SUMAtriptan 50 MG tablet   Commonly known as: IMITREX   Take 50 mg by mouth every 2 (two) hours as needed. headache      tiotropium 18 MCG inhalation capsule   Commonly known as: SPIRIVA   Place 1 capsule (18 mcg total) into inhaler and inhale daily.      vitamin B-12 1000 MCG tablet   Commonly known as: CYANOCOBALAMIN   Take 1,000 mcg by mouth daily.      Vitamin D3 1000 UNITS Caps   Take 1 capsule by mouth daily.      warfarin 5 MG tablet   Commonly known as: COUMADIN   Take 1 tablet (5 mg total) by mouth daily.      zolpidem 10 MG tablet   Commonly known as: AMBIEN   Take 1 tablet (10 mg total) by mouth at bedtime.  Diagnostic Studies: X-ray Knee Right Port  12/11/2011  *RADIOLOGY REPORT*  Clinical Data: Radiograph.  Right knee arthroplasty.  PORTABLE RIGHT KNEE - 1-2 VIEW  Comparison: 04/13/2011 MRI.  Findings: New uncomplicated right three-part total knee arthroplasty.  Expected postoperative changes in the soft tissues. Fluid distends the suprapatellar pouch and soft tissues anterior to the femur.  IMPRESSION: Uncomplicated new right total knee arthroplasty.  Original Report Authenticated By: Andreas Newport, M.D.    Disposition: 01-Home or Self Care  Discharge Orders    Future Appointments: Provider: Department: Dept Phone: Center:   02/08/2012 1:45 PM Storm Frisk, MD [facility] [phone] None     Future Orders Please Complete By Expires   Diet general      Call MD / Call 911      Comments:   If you experience chest pain or shortness of breath, CALL 911 and be transported to the hospital emergency room.  If you develope a fever above 101 F, pus (white drainage) or increased drainage or redness at the wound, or calf pain, call your surgeon's office.   Discharge instructions      Comments:   Change dressing in 3 days and reapply fresh dressing, unless you have a splint (half cast).  If you have a splint/cast, just leave in place until your follow-up appointment.    Keep wounds dry for 3 weeks.  Leave steri-strips in place on skin.  Do not apply lotion or anything to the wound.   Constipation Prevention      Comments:   Drink plenty of fluids.  Prune juice may be helpful.  You may use a stool softener, such as Colace (over the counter) 100 mg twice a day.  Use MiraLax (over the counter) for constipation as needed.   CPM      Comments:   Continuous passive motion machine (CPM):      Use the CPM from 0 to 60 for 6 hours per day.      You may increase by 10 degrees per day.  You may break it up into 2 or 3 sessions per day.      Use CPM for 2 weeks or until you are told to stop.   TED  hose      Comments:   Use stockings (TED hose) for 2 weeks on both leg(s).  You may remove them at night for sleeping.   Change dressing      Comments:   Change dressing in three days, then change the dressing daily with sterile 4 x 4 inch gauze dressing.  You may clean the incision with alcohol prior to redressing.   Do not put a pillow under the knee. Place it under the heel.         Follow-up Information    Follow up with Lilith Solana P, MD in 2 weeks.   Contact information:   [facility] [address] [place] [address] [phone]       Follow up with Shan Levans, MD on 02/08/2012. (1:45pm )    Contact information:   [address] [facility] [address] [place] [address] [place] [address] [phone]           Signed: Eulas Post 12/14/2011, 7:28 AM

## 2011-12-14 NOTE — Progress Notes (Signed)
Pt being transported to SNF via EMS at this time. Husband at bedside

## 2011-12-20 ENCOUNTER — Telehealth: Payer: Self-pay

## 2011-12-20 NOTE — Telephone Encounter (Signed)
Left detailed message on AM. 

## 2011-12-20 NOTE — Telephone Encounter (Signed)
Tried to phone the daughter.  The phone stops ringing but no answer and no VM prompt.  Will continue to try to phone periodically.

## 2011-12-20 NOTE — Telephone Encounter (Signed)
Pt started 12/11/11 with confusion and hallucinations in [facility] hospital after knee replacement. Now at [facility] rehab center all pain med stopped but still experiencing confusion and agitation. Hallucinations are better. Pt is aware of her family and where she is but yesterday she thought her daughter who is 37 appeared pregnant. Please advise. [facility] [address]

## 2011-12-20 NOTE — Telephone Encounter (Signed)
I appreciate the update.  The attending MD at the facility needs to be aware to eval the patient.

## 2012-01-07 ENCOUNTER — Telehealth: Payer: Self-pay | Admitting: Family Medicine

## 2012-01-07 MED ORDER — OXYCODONE-ACETAMINOPHEN 7.5-325 MG PO TABS: 1.0000 | ORAL_TABLET | Freq: Four times a day (QID) | ORAL | Status: DC | PRN

## 2012-01-07 NOTE — Telephone Encounter (Signed)
Please give to patient and schedule f/u when this is feasible for the patient.

## 2012-01-07 NOTE — Telephone Encounter (Signed)
Caller: Sandi/Mother; PCP: Sunnie Nielsen); CB#: [phone]; Call regarding Needs Rx Refill, Update On Rt Knee Replacement;  Pt calling asking for a refill on Oxycodone 7.5/325 mg. For rt knee pain. Pt had knee replacement in May and was in [facility] for 20 days in [place]. Pt wanted to update MD and ask for Rx refill. Pt asking for a call back when Rx is ready so daughter can pick up and refill for pt. Pt's best call back number above.

## 2012-01-08 NOTE — Telephone Encounter (Signed)
Patient advised.  Rx left at front desk for pick up. 

## 2012-01-23 ENCOUNTER — Telehealth: Payer: Self-pay

## 2012-01-23 NOTE — Telephone Encounter (Signed)
Crystal with [facility] request extension of orders for 1 visit x 1 week.Please advise.

## 2012-01-23 NOTE — Telephone Encounter (Signed)
Please give verbal order, if hard copy needed then print this and I'll sign.  Thanks.

## 2012-01-23 NOTE — Telephone Encounter (Signed)
Crystal notified as instructed by telephone. Crystal stated that she will fax over a form to be signed by Dr. Para March.

## 2012-02-08 ENCOUNTER — Ambulatory Visit: Payer: Medicare Other | Admitting: Critical Care Medicine

## 2012-02-14 ENCOUNTER — Ambulatory Visit (INDEPENDENT_AMBULATORY_CARE_PROVIDER_SITE_OTHER): Payer: Medicare Other | Admitting: Family Medicine

## 2012-02-14 ENCOUNTER — Encounter: Payer: Self-pay | Admitting: Family Medicine

## 2012-02-14 VITALS — BP 142/72 | HR 82 | Temp 98.4°F | Wt 200.0 lb

## 2012-02-14 DIAGNOSIS — M1711 Unilateral primary osteoarthritis, right knee: Secondary | ICD-10-CM

## 2012-02-14 DIAGNOSIS — IMO0002 Reserved for concepts with insufficient information to code with codable children: Secondary | ICD-10-CM

## 2012-02-14 DIAGNOSIS — M1710 Unilateral primary osteoarthritis, unspecified knee: Secondary | ICD-10-CM

## 2012-02-14 DIAGNOSIS — D649 Anemia, unspecified: Secondary | ICD-10-CM

## 2012-02-14 DIAGNOSIS — E039 Hypothyroidism, unspecified: Secondary | ICD-10-CM

## 2012-02-14 LAB — CBC WITH DIFFERENTIAL/PLATELET
Basophils Absolute: 0 10*3/uL (ref 0.0–0.1)
Basophils Relative: 0.6 % (ref 0.0–3.0)
Eosinophils Absolute: 0.2 10*3/uL (ref 0.0–0.7)
Eosinophils Relative: 4 % (ref 0.0–5.0)
HCT: 32.6 % — ABNORMAL LOW (ref 36.0–46.0)
Hemoglobin: 10.6 g/dL — ABNORMAL LOW (ref 12.0–15.0)
Lymphocytes Relative: 32.2 % (ref 12.0–46.0)
Lymphs Abs: 1.7 10*3/uL (ref 0.7–4.0)
MCHC: 32.5 g/dL (ref 30.0–36.0)
MCV: 94.8 fl (ref 78.0–100.0)
Monocytes Absolute: 0.4 10*3/uL (ref 0.1–1.0)
Monocytes Relative: 8 % (ref 3.0–12.0)
Neutro Abs: 2.9 10*3/uL (ref 1.4–7.7)
Neutrophils Relative %: 55.2 % (ref 43.0–77.0)
Platelets: 236 10*3/uL (ref 150.0–400.0)
RBC: 3.44 Mil/uL — ABNORMAL LOW (ref 3.87–5.11)
RDW: 15.3 % — ABNORMAL HIGH (ref 11.5–14.6)
WBC: 5.2 10*3/uL (ref 4.5–10.5)

## 2012-02-14 LAB — TSH: TSH: 0.48 u[IU]/mL (ref 0.35–5.50)

## 2012-02-14 MED ORDER — FLUTICASONE-SALMETEROL 230-21 MCG/ACT IN AERO: 2.0000 | INHALATION_SPRAY | Freq: Two times a day (BID) | RESPIRATORY_TRACT | Status: DC

## 2012-02-14 MED ORDER — LEVOTHYROXINE SODIUM 112 MCG PO TABS: 112.0000 ug | ORAL_TABLET | Freq: Every day | ORAL | Status: DC

## 2012-02-14 MED ORDER — OXYCODONE-ACETAMINOPHEN 7.5-325 MG PO TABS: 1.0000 | ORAL_TABLET | Freq: Four times a day (QID) | ORAL | Status: DC | PRN

## 2012-02-14 MED ORDER — ZOLPIDEM TARTRATE 10 MG PO TABS: 10.0000 mg | ORAL_TABLET | Freq: Every day | ORAL | Status: DC

## 2012-02-14 MED ORDER — RANITIDINE HCL 150 MG PO TABS: 150.0000 mg | ORAL_TABLET | Freq: Two times a day (BID) | ORAL | Status: DC

## 2012-02-14 NOTE — Progress Notes (Signed)
Had her R knee replaced and is off coumadin now.  Was in rehab and is now at home.  Had some post op anemia and confusion, confusion resolved.  Due for recheck CBC.  She was transfused.  R knee pain continues, still using pain meds and has no ADE, no constipation since using stool softeners/miralax.    Overall, she feels well except when she needs to hurry.  She's still on oxygen.  She's using her walker w/o falls.  She's still doing her exercises at home, but w/o home supervision now  She's trying to eventually transition back to single point cane.  Needing 4-5 oxycodone on a "bad" day, some days she'll need less.    Hypothyroidism.  Due for recheck.  Compliant with meds.  Needs refills.    Meds, vitals, and allergies reviewed.   ROS: See HPI.  Otherwise, noncontributory.  nad ncat On O2 rrr ctab-globally decreased but w/o wheeze or focal dec in BS abd soft R knee puffy but not red

## 2012-02-14 NOTE — Patient Instructions (Addendum)
Keep using miralax and the stool softeners.  You can cut back as you need less of the pain medicine.   Go to the lab on the way out.  We'll contact you with your lab report. Take care.

## 2012-02-15 ENCOUNTER — Encounter: Payer: Self-pay | Admitting: *Deleted

## 2012-02-15 NOTE — Assessment & Plan Note (Signed)
Continue prn pain meds for now with bowel regimen. She agrees.

## 2012-02-15 NOTE — Assessment & Plan Note (Signed)
Recheck CBC, see notes on labs.  No known blood loss since the operation.

## 2012-02-15 NOTE — Assessment & Plan Note (Signed)
Recheck TSH, see notes on labs.

## 2012-03-19 ENCOUNTER — Encounter: Payer: Self-pay | Admitting: Critical Care Medicine

## 2012-03-19 ENCOUNTER — Ambulatory Visit (INDEPENDENT_AMBULATORY_CARE_PROVIDER_SITE_OTHER): Payer: Medicare Other | Admitting: Critical Care Medicine

## 2012-03-19 VITALS — BP 118/84 | HR 77 | Temp 98.3°F | Ht 60.0 in | Wt 198.0 lb

## 2012-03-19 DIAGNOSIS — Z23 Encounter for immunization: Secondary | ICD-10-CM

## 2012-03-19 DIAGNOSIS — J45909 Unspecified asthma, uncomplicated: Secondary | ICD-10-CM

## 2012-03-19 NOTE — Patient Instructions (Signed)
No change in medications. Return in        4 months Flu vaccine was given 

## 2012-03-19 NOTE — Progress Notes (Signed)
Patient ID: [name], female    DOB: Apr 24, 1931, 78 y.o.   MRN: [id number]  HPI  78 y.o. F asthmatic bronchitis  03/19/2012 Pt had R knee surgery 12/2011. Pt had hard time.  Pt went to [facility] for rehab.  Dr Dion Saucier Careers adviser. No new pulm issues. Pt denies any significant sore throat, nasal congestion or excess secretions, fever, chills, sweats, unintended weight loss, pleurtic or exertional chest pain, orthopnea PND, or leg swelling Pt denies any increase in rescue therapy over baseline, denies waking up needing it or having any early am or nocturnal exacerbations of coughing/wheezing/or dyspnea. Pt also denies any obvious fluctuation in symptoms with  weather or environmental change or other alleviating or aggravating factors    Past Medical History  Diagnosis Date  . Osteoporosis, unspecified   . Allergic rhinitis, cause unspecified   . Other chronic pain   . Other abnormal blood chemistry   . Insomnia, unspecified   . Acute bronchitis   . Need for prophylactic hormone replacement therapy (postmenopausal)   . Degeneration of intervertebral disc, site unspecified   . Allergy, unspecified not elsewhere classified   . Myalgia and myositis, unspecified   . Unspecified essential hypertension   . Other and unspecified hyperlipidemia   . Esophageal reflux   . Diverticulosis of colon (without mention of hemorrhage)   . Unspecified hypothyroidism   . Stroke     mini strokes  . Asthma   . Shortness of breath   . COPD (chronic obstructive pulmonary disease)   . Chronic kidney disease     frequency  . Anxiety   . Osteoarthritis of right knee 08/27/2011    R knee pain, s/p arthroscopy 2012 per [name]      Family History  Problem Relation Age of Onset  . Lung cancer Brother     x2  . Hypertension Father   . Heart disease Mother   . Asthma Sister      History   Social History  . Marital Status: Married    Spouse Name: N/A    Number of Children: N/A  . Years of  Education: N/A   Occupational History  . Housewife    Social History Main Topics  . Smoking status: Former Smoker -- 1.5 packs/day for 10 years    Types: Cigarettes    Quit date: 07/09/1985  . Smokeless tobacco: Never Used   Comment: 1 1/2 ppd x 20 years  . Alcohol Use: No  . Drug Use: No  . Sexually Active: Not on file   Other Topics Concern  . Not on file   Social History Narrative  . No narrative on file     Allergies  Allergen Reactions  . Sulfadiazine     REACTION: Fever, achey     Outpatient Prescriptions Prior to Visit  Medication Sig Dispense Refill  . albuterol (PROAIR HFA) 108 (90 BASE) MCG/ACT inhaler Inhale 2 puffs into the lungs every 6 (six) hours as needed.  1 Inhaler  6  . ALPRAZolam (XANAX) 1 MG tablet Take 0.5 mg by mouth 2 (two) times daily as needed. anxiety      . amLODipine (NORVASC) 2.5 MG tablet Take 1 tablet (2.5 mg total) by mouth daily.  90 tablet  3  . Calcium Carbonate (CALTRATE 600 PO) Take 1 tablet by mouth daily.        . Cholecalciferol (VITAMIN D3) 1000 UNITS CAPS Take 1 capsule by mouth daily.        Marland Kitchen  DULoxetine (CYMBALTA) 60 MG capsule Take 60 mg by mouth daily.      . fluticasone-salmeterol (ADVAIR HFA) 230-21 MCG/ACT inhaler Inhale 2 puffs into the lungs 2 (two) times daily.  2 Inhaler  12  . levothyroxine (SYNTHROID, LEVOTHROID) 112 MCG tablet Take 1 tablet (112 mcg total) by mouth daily.  90 tablet  3  . mometasone (NASONEX) 50 MCG/ACT nasal spray Place 2 sprays into the nose 2 (two) times daily.      . Multiple Vitamin (MULTIVITAMIN) capsule Take 1 capsule by mouth daily.        Marland Kitchen oxyCODONE-acetaminophen (PERCOCET) 7.5-325 MG per tablet Take 1-1.5 tablets by mouth every 6 (six) hours as needed for pain.  100 tablet  0  . potassium chloride (KLOR-CON) 10 MEQ CR tablet Take 1 tablet (10 mEq total) by mouth daily.  90 tablet  3  . ranitidine (ZANTAC) 150 MG tablet Take 1 tablet (150 mg total) by mouth 2 (two) times daily.  180 tablet  3   . tiotropium (SPIRIVA HANDIHALER) 18 MCG inhalation capsule Place 1 capsule (18 mcg total) into inhaler and inhale daily.  30 capsule  11  . vitamin B-12 (CYANOCOBALAMIN) 1000 MCG tablet Take 1,000 mcg by mouth daily.        Marland Kitchen zolpidem (AMBIEN) 10 MG tablet Take 1 tablet (10 mg total) by mouth at bedtime.  30 tablet  5  . SUMAtriptan (IMITREX) 50 MG tablet Take 50 mg by mouth every 2 (two) hours as needed. headache            Review of Systems  Constitutional:   No  weight loss, night sweats,  Fevers, chills, fatigue, or  lassitude.  HEENT:   No headaches,  Difficulty swallowing,  Tooth/dental problems, or  Sore throat,                No sneezing, itching, ear ache,  +nasal congestion, post nasal drip,   CV:  No chest pain,  Orthopnea, PND, swelling in lower extremities, anasarca, dizziness, palpitations, syncope.   GI  No heartburn, indigestion, abdominal pain, nausea, vomiting, diarrhea, change in bowel habits, loss of appetite, bloody stools.   Resp:    No coughing up of blood.     No chest wall deformity  Skin: no rash or lesions.  GU: no dysuria, change in color of urine, no urgency or frequency.  No flank pain, no hematuria   MS:  No joint pain or swelling.  No decreased range of motion.  No back pain.  Psych:  No change in mood or affect. No depression or anxiety.  No memory loss.     Objective:   Physical Exam BP 118/84  Pulse 77  Temp 98.3 F (36.8 C) (Oral)  Ht 5' (1.524 m)  Wt 89.812 kg (198 lb)  BMI 38.67 kg/m2  SpO2 96%   GEN: A/Ox3; pleasant , NAD, overweight, O2  HEENT:  [place]/AT,  EACs-clear, TMs-wnl, NOSE-clear drainage , THROAT-clear, no lesions, no postnasal drip or exudate noted.   NECK:  Supple w/ fair ROM; no JVD; normal carotid impulses w/o bruits; no thyromegaly or nodules palpated; no lymphadenopathy.  RESP  -distant breath sounds with resolution of rhonchi and wheezes  CARD:  RRR, no m/r/g  , no peripheral edema, pulses intact, no  cyanosis or clubbing.  GI:   Soft & nt; nml bowel sounds; no organomegaly or masses detected.  Musco: Warm bil, no deformities or joint swelling noted.   Neuro: alert,  no focal deficits noted.    Skin: Warm, no lesions or rashes  No results found.        Asthmatic bronchitis Asthmatic bronchitis with chronic obstructive lung disease component stable at this time Plan Maintain inhaled medications as prescribed Flu vaccine will be administered at this visit    Updated Medication List Outpatient Encounter Prescriptions as of 03/19/2012  Medication Sig Dispense Refill  . albuterol (PROAIR HFA) 108 (90 BASE) MCG/ACT inhaler Inhale 2 puffs into the lungs every 6 (six) hours as needed.  1 Inhaler  6  . ALPRAZolam (XANAX) 1 MG tablet Take 0.5 mg by mouth 2 (two) times daily as needed. anxiety      . amLODipine (NORVASC) 2.5 MG tablet Take 1 tablet (2.5 mg total) by mouth daily.  90 tablet  3  . Calcium Carbonate (CALTRATE 600 PO) Take 1 tablet by mouth daily.        . Cholecalciferol (VITAMIN D3) 1000 UNITS CAPS Take 1 capsule by mouth daily.        . DULoxetine (CYMBALTA) 60 MG capsule Take 60 mg by mouth daily.      . fluticasone-salmeterol (ADVAIR HFA) 230-21 MCG/ACT inhaler Inhale 2 puffs into the lungs 2 (two) times daily.  2 Inhaler  12  . levothyroxine (SYNTHROID, LEVOTHROID) 112 MCG tablet Take 1 tablet (112 mcg total) by mouth daily.  90 tablet  3  . mometasone (NASONEX) 50 MCG/ACT nasal spray Place 2 sprays into the nose 2 (two) times daily.      . Multiple Vitamin (MULTIVITAMIN) capsule Take 1 capsule by mouth daily.        Marland Kitchen oxyCODONE-acetaminophen (PERCOCET) 7.5-325 MG per tablet Take 1-1.5 tablets by mouth every 6 (six) hours as needed for pain.  100 tablet  0  . potassium chloride (KLOR-CON) 10 MEQ CR tablet Take 1 tablet (10 mEq total) by mouth daily.  90 tablet  3  . ranitidine (ZANTAC) 150 MG tablet Take 1 tablet (150 mg total) by mouth 2 (two) times daily.  180 tablet   3  . tiotropium (SPIRIVA HANDIHALER) 18 MCG inhalation capsule Place 1 capsule (18 mcg total) into inhaler and inhale daily.  30 capsule  11  . vitamin B-12 (CYANOCOBALAMIN) 1000 MCG tablet Take 1,000 mcg by mouth daily.        . VOLTAREN 1 % GEL Twice daily.      Marland Kitchen zolpidem (AMBIEN) 10 MG tablet Take 1 tablet (10 mg total) by mouth at bedtime.  30 tablet  5  . DISCONTD: SUMAtriptan (IMITREX) 50 MG tablet Take 50 mg by mouth every 2 (two) hours as needed. headache

## 2012-03-19 NOTE — Assessment & Plan Note (Signed)
Asthmatic bronchitis with chronic obstructive lung disease component stable at this time Plan Maintain inhaled medications as prescribed Flu vaccine will be administered at this visit

## 2012-03-21 ENCOUNTER — Other Ambulatory Visit: Payer: Self-pay

## 2012-03-21 NOTE — Telephone Encounter (Signed)
Pt left v/m requesting rx oxycodone apap. Call when ready for pick up; Mon is ok for pick up.

## 2012-03-23 MED ORDER — OXYCODONE-ACETAMINOPHEN 7.5-325 MG PO TABS: 1.0000 | ORAL_TABLET | Freq: Four times a day (QID) | ORAL | Status: DC | PRN

## 2012-03-23 NOTE — Telephone Encounter (Signed)
Please give to patient after I sign. Thanks.  

## 2012-03-24 NOTE — Telephone Encounter (Signed)
Patient advised.  Rx left at front desk for pick up. 

## 2012-04-10 ENCOUNTER — Other Ambulatory Visit: Payer: Self-pay | Admitting: *Deleted

## 2012-04-10 MED ORDER — DULOXETINE HCL 60 MG PO CPEP: 60.0000 mg | ORAL_CAPSULE | Freq: Every day | ORAL | Status: DC

## 2012-04-10 MED ORDER — LEVOTHYROXINE SODIUM 112 MCG PO TABS: 112.0000 ug | ORAL_TABLET | Freq: Every day | ORAL | Status: DC

## 2012-04-10 NOTE — Telephone Encounter (Addendum)
Pt request 90 day rx for cymbalta and levothyroxine  I called pt re: levothyroxine rx, pharmacy fax says 125 mcg, and our records had 112 mcg. Pt states she was put on 125 at [facility], but only took that dose while she was there. She has been back on 112 mcg for at least a few months. Will send in rx for 112 mcg.

## 2012-04-10 NOTE — Telephone Encounter (Signed)
cymbalta sent.  TSH prev wnl.  Thanks.

## 2012-04-28 ENCOUNTER — Other Ambulatory Visit: Payer: Self-pay

## 2012-04-28 NOTE — Telephone Encounter (Signed)
Pt request rx oxycodone apap. Call when ready for pick up. 

## 2012-04-29 MED ORDER — OXYCODONE-ACETAMINOPHEN 7.5-325 MG PO TABS: 1.0000 | ORAL_TABLET | Freq: Four times a day (QID) | ORAL | Status: DC | PRN

## 2012-04-29 NOTE — Telephone Encounter (Signed)
Patient advised.  Rx left at front desk for pick up. 

## 2012-04-29 NOTE — Telephone Encounter (Signed)
Please call/give to patient after I sign.

## 2012-05-21 ENCOUNTER — Encounter: Payer: Self-pay | Admitting: Family Medicine

## 2012-05-21 ENCOUNTER — Ambulatory Visit (INDEPENDENT_AMBULATORY_CARE_PROVIDER_SITE_OTHER): Payer: Medicare Other | Admitting: Family Medicine

## 2012-05-21 VITALS — BP 132/76 | HR 84 | Temp 99.0°F | Wt 194.8 lb

## 2012-05-21 DIAGNOSIS — J45909 Unspecified asthma, uncomplicated: Secondary | ICD-10-CM

## 2012-05-21 DIAGNOSIS — IMO0002 Reserved for concepts with insufficient information to code with codable children: Secondary | ICD-10-CM

## 2012-05-21 DIAGNOSIS — M1710 Unilateral primary osteoarthritis, unspecified knee: Secondary | ICD-10-CM

## 2012-05-21 DIAGNOSIS — M1711 Unilateral primary osteoarthritis, right knee: Secondary | ICD-10-CM

## 2012-05-21 MED ORDER — OXYCODONE-ACETAMINOPHEN 7.5-325 MG PO TABS: 1.0000 | ORAL_TABLET | Freq: Four times a day (QID) | ORAL | Status: DC | PRN

## 2012-05-21 MED ORDER — PREDNISONE 10 MG PO TABS: ORAL_TABLET | ORAL | Status: DC

## 2012-05-21 NOTE — Assessment & Plan Note (Signed)
Hold on abx as sputum is improved, start steroid taper with GI cautions and continue O2, baseline meds.  She agrees.

## 2012-05-21 NOTE — Patient Instructions (Addendum)
Start on the prednisone, take it with food.  If you aren't improving, then let us know.   Take care.  Glad to see you.   Have the pharmacy call or fax a note about your other meds for refills.

## 2012-05-21 NOTE — Progress Notes (Signed)
Hoarse for 3-4 days, on O2 at baseline.  No family has been sick with similar.  No fevers >100.4 but she felt hot prev.  Coughing more prev, but not as much today and yesterday.  Occ sputum, more than normal, gray/tan but this is getting some better recently.    R knee pain continues, lateral/anterior.  She had been using pain meds w/o ADE and did have some relief.  Will see ortho tomorrow.   She'll call back about other med refills.    She's worried about her husband who has been ill.  Has family help- two daughters.   Meds, vitals, and allergies reviewed.   ROS: See HPI.  Otherwise, noncontributory.  Nad, on O2 ncat Voice is hoarse TM wnl Nasal exam stuffy OP with mild cobblestoning Neck supple rrr No inc in wob but diffuse exp wheezes noted  R TKR scar well healed.

## 2012-05-21 NOTE — Assessment & Plan Note (Signed)
Continue current pain meds, she'll f/u with ortho.

## 2012-05-27 ENCOUNTER — Telehealth: Payer: Self-pay | Admitting: Family Medicine

## 2012-05-27 MED ORDER — HYDROCODONE-HOMATROPINE 5-1.5 MG/5ML PO SYRP: 5.0000 mL | ORAL_SOLUTION | Freq: Three times a day (TID) | ORAL | Status: DC | PRN

## 2012-05-27 NOTE — Telephone Encounter (Signed)
Patient Information:  Caller Name: [name]  Phone: [phone]  Patient: [name]  Gender: Female  DOB: Oct 07, 1930  Age: 78 Years  PCP: Crawford Givens Clelia Croft) [facility])   Symptoms  Reason For Call & Symptoms: Wants cough med  Reviewed Health History In EMR: Yes  Reviewed Medications In EMR: Yes  Reviewed Allergies In EMR: Yes  Date of Onset of Symptoms: 05/20/2012  Treatments Tried: Has taken OTC cough meds  Treatments Tried Worked: No  Guideline(s) Used:  Cough  Disposition Per Guideline:   See Today or Tomorrow in Office  Reason For Disposition Reached:   Continuous (nonstop) coughing interferes with work or school and no improvement using cough treatment per Care Advice  Advice Given:  Coughing Spasms:  Drink warm fluids. Inhale warm mist&nbsp;(Reason: both relax the airway and loosen up the phlegm).  Expected Course:   Viral bronchitis (chest cold) causes a cough that lasts 1 to 3 weeks. Sometimes you may cough up lots of phlegm (sputum, mucus). The mucus can normally be white, gray, yellow, or green.  Call Back If:  Difficulty breathing  You become worse.  Office Follow Up:  Does the office need to follow up with this patient?: Yes  Instructions For The Office: Please direct to Dr Para March re question about cough Rx and notify pt if he will or will not grant her request. OV on 11/13 for these sx's. Mrs [name] wants to know if Dr Para March with call in a "strong cough medicine." Sts OTC meds are not helping. She will try the home remedies this Rn advised.  [facility] [employer] [phone]

## 2012-05-27 NOTE — Telephone Encounter (Signed)
Medication phoned to pharmacy. Patient advised.  

## 2012-05-27 NOTE — Telephone Encounter (Signed)
Please call in.  Sedation caution, esp with her other meds.  If not improving,then needs recheck. Thanks.

## 2012-05-28 ENCOUNTER — Other Ambulatory Visit: Payer: Self-pay

## 2012-05-28 MED ORDER — ALPRAZOLAM 1 MG PO TABS: 0.5000 mg | ORAL_TABLET | Freq: Two times a day (BID) | ORAL | Status: DC | PRN

## 2012-05-28 MED ORDER — AMLODIPINE BESYLATE 2.5 MG PO TABS: 2.5000 mg | ORAL_TABLET | Freq: Every day | ORAL | Status: DC

## 2012-05-28 NOTE — Telephone Encounter (Signed)
[facility] faxed refill for amlodipine (done) and alprazolam.Please advise.No last fill dates given.

## 2012-05-28 NOTE — Telephone Encounter (Signed)
Please call in xanax. Thanks! 

## 2012-05-29 ENCOUNTER — Other Ambulatory Visit: Payer: Self-pay | Admitting: *Deleted

## 2012-05-29 MED ORDER — POTASSIUM CHLORIDE ER 10 MEQ PO TBCR: 10.0000 meq | EXTENDED_RELEASE_TABLET | Freq: Every day | ORAL | Status: DC

## 2012-05-29 NOTE — Telephone Encounter (Signed)
Rx phoned to pharmacy.  

## 2012-06-04 ENCOUNTER — Telehealth: Payer: Self-pay | Admitting: Family Medicine

## 2012-06-04 NOTE — Telephone Encounter (Signed)
Patient notified as instructed by telephone. Was informed by patient that she will see how she is feeling Friday and if not better may call for an appointment.

## 2012-06-04 NOTE — Telephone Encounter (Signed)
Call pt.  Get info to clarify this.

## 2012-06-04 NOTE — Telephone Encounter (Signed)
All I can offer is that she be seen.  I don't know if she'll need a CXR, an antibiotic (or which antibiotic), etc.  I can't safely rx this over the phone w/o having her seen.  I appreciate that she would like not to have to come in, but if she has clinically declined she needs recheck either here or at [facility].  She was advised prev that is worsening, she'd need to be rechecked.  See 05/27/12 note.

## 2012-06-04 NOTE — Telephone Encounter (Signed)
Patient Information:  Caller Name: Kamiya  Phone: [phone]  Patient: Alice, [name]  Gender: Female  DOB: May 25, 1931  Age: 78 Years  PCP: Crawford Givens Clelia Croft) [facility])   Symptoms  Reason For Call & Symptoms: Pt. calling, was seen last week for bronchial issues, had improved but now has a sore throat.  Reviewed Health History In EMR: Yes  Reviewed Medications In EMR: Yes  Reviewed Allergies In EMR: Yes  Reviewed Surgeries / Procedures: Yes  Date of Onset of Symptoms: 06/03/2012  Treatments Tried: Motrin  Treatments Tried Worked: No  Any Fever: Yes  Fever Taken: Oral  Fever Time Of Reading: 20:00:44  Fever Last Reading: 101  Guideline(s) Used:  Sore Throat  Disposition Per Guideline:   See Today in Office  Reason For Disposition Reached:   Severe sore throat pain  Advice Given:  For Relief of Sore Throat Pain:  Suck on hard candy or a throat lozenge (over-the-counter).  Gargle warm salt water 3 times daily (1 teaspoon of salt in 8 oz or 240 ml of warm water).  Soft Diet:   Cold drinks and milk shakes are especially good (Reason: swollen tonsils can make some foods hard to swallow).  Office Follow Up:  Does the office need to follow up with this patient?: Yes  Instructions For The Office: Patient refusing advice.  Patient Refused Recommendation:  Patient Refused Care Advice  Refuses appt.  RN Note:  Refuses an OV.  Wanting medication called in.   Taking Motrin for the fever and it is down at this time, thinks it is normal. States "tell him I am desperate for something" to be done.  Wants to go back to "old fashion doctoring".

## 2012-06-04 NOTE — Telephone Encounter (Signed)
Patient states that she has a terrible sinus headache, coughing at night, fever 101.0 last night, no fever at this time, runny nose. Patient states that she feels rotten and needs something to knock this out.  Patient states that she wish that she had her old fashion doctor back that would know what she needed and would call something in for her without her having to be seen. Patient states that she was in last week and is not any better and feels that something should be called in for her.

## 2012-06-11 ENCOUNTER — Other Ambulatory Visit: Payer: Self-pay | Admitting: Orthopedic Surgery

## 2012-06-11 DIAGNOSIS — M25512 Pain in left shoulder: Secondary | ICD-10-CM

## 2012-06-15 ENCOUNTER — Other Ambulatory Visit: Payer: Medicare Other

## 2012-06-18 ENCOUNTER — Ambulatory Visit
Admission: RE | Admit: 2012-06-18 | Discharge: 2012-06-18 | Disposition: A | Discharge status: Home / Self Care | Payer: Medicare Other | Source: Ambulatory Visit | Attending: Orthopedic Surgery | Admitting: Orthopedic Surgery

## 2012-06-18 DIAGNOSIS — M25512 Pain in left shoulder: Secondary | ICD-10-CM

## 2012-06-19 ENCOUNTER — Other Ambulatory Visit: Payer: Medicare Other

## 2012-07-07 ENCOUNTER — Telehealth: Payer: Self-pay | Admitting: Family Medicine

## 2012-07-07 NOTE — Telephone Encounter (Signed)
Patient calling to ask for a new presricption for her Percocet 7.5/325.  She would like to pick this up Tuesday am 12/31.

## 2012-07-08 MED ORDER — OXYCODONE-ACETAMINOPHEN 7.5-325 MG PO TABS: 1.0000 | ORAL_TABLET | Freq: Four times a day (QID) | ORAL | Status: DC | PRN

## 2012-07-08 NOTE — Telephone Encounter (Signed)
Please give to patient.  Thanks. 

## 2012-07-08 NOTE — Telephone Encounter (Signed)
Patient's husband notified by telephone that script is up front and ready for pickup.

## 2012-07-15 ENCOUNTER — Ambulatory Visit: Payer: Medicare Other | Admitting: Critical Care Medicine

## 2012-08-06 ENCOUNTER — Other Ambulatory Visit: Payer: Self-pay | Admitting: Family Medicine

## 2012-08-06 NOTE — Telephone Encounter (Signed)
Caller: [name]/Patient; Phone: [phone]; Reason for Call: Patient calling, needs to get a script for her Oxycodone 7.  5/325.  Please call her to let her know when this is ready for pick up.  [phone].

## 2012-08-07 MED ORDER — OXYCODONE-ACETAMINOPHEN 7.5-325 MG PO TABS
1.0000 | ORAL_TABLET | Freq: Four times a day (QID) | ORAL | Status: DC | PRN
Start: 2012-08-07 — End: 2012-09-09

## 2012-08-07 NOTE — Telephone Encounter (Signed)
Patient advised.  Rx left at front desk for pick up. 

## 2012-08-07 NOTE — Telephone Encounter (Signed)
Printed

## 2012-08-25 ENCOUNTER — Other Ambulatory Visit: Payer: Self-pay | Admitting: *Deleted

## 2012-08-25 NOTE — Telephone Encounter (Signed)
Faxed refill request.  Last fill:  07/25/12  Please advise.

## 2012-08-26 ENCOUNTER — Ambulatory Visit: Payer: Medicare Other | Admitting: Critical Care Medicine

## 2012-08-26 MED ORDER — ZOLPIDEM TARTRATE 10 MG PO TABS: 10.0000 mg | ORAL_TABLET | Freq: Every day | ORAL | Status: DC

## 2012-08-26 NOTE — Telephone Encounter (Signed)
Rx phoned to pharmacy.  

## 2012-08-26 NOTE — Telephone Encounter (Signed)
Please call in

## 2012-09-02 ENCOUNTER — Other Ambulatory Visit: Payer: Self-pay | Admitting: Critical Care Medicine

## 2012-09-09 ENCOUNTER — Other Ambulatory Visit: Payer: Self-pay | Admitting: Family Medicine

## 2012-09-09 NOTE — Telephone Encounter (Signed)
Pt requesting refill on Percocet.  Last filled 08/06/2012.

## 2012-09-10 MED ORDER — OXYCODONE-ACETAMINOPHEN 7.5-325 MG PO TABS: 1.0000 | ORAL_TABLET | Freq: Four times a day (QID) | ORAL | Status: DC | PRN

## 2012-09-10 NOTE — Telephone Encounter (Signed)
Patient notified as instructed by telephone rx at front desk for pick up.

## 2012-09-10 NOTE — Telephone Encounter (Signed)
Printed.  Thanks.  

## 2012-09-18 ENCOUNTER — Other Ambulatory Visit: Payer: Self-pay | Admitting: *Deleted

## 2012-09-18 MED ORDER — ALPRAZOLAM 1 MG PO TABS: 0.5000 mg | ORAL_TABLET | Freq: Two times a day (BID) | ORAL | Status: DC | PRN

## 2012-09-18 NOTE — Telephone Encounter (Signed)
Please call in

## 2012-09-18 NOTE — Telephone Encounter (Signed)
Last filled 08/14/12

## 2012-09-19 NOTE — Telephone Encounter (Signed)
Rx phoned to pharmacy.  

## 2012-10-30 ENCOUNTER — Encounter: Payer: Self-pay | Admitting: Family Medicine

## 2012-10-30 ENCOUNTER — Ambulatory Visit (INDEPENDENT_AMBULATORY_CARE_PROVIDER_SITE_OTHER): Payer: Medicare Other | Admitting: Family Medicine

## 2012-10-30 VITALS — BP 152/80 | HR 88 | Temp 97.7°F | Wt 189.0 lb

## 2012-10-30 DIAGNOSIS — J309 Allergic rhinitis, unspecified: Secondary | ICD-10-CM

## 2012-10-30 DIAGNOSIS — J302 Other seasonal allergic rhinitis: Secondary | ICD-10-CM

## 2012-10-30 DIAGNOSIS — G8929 Other chronic pain: Secondary | ICD-10-CM

## 2012-10-30 DIAGNOSIS — J3089 Other allergic rhinitis: Secondary | ICD-10-CM

## 2012-10-30 MED ORDER — OXYCODONE-ACETAMINOPHEN 7.5-325 MG PO TABS: 1.0000 | ORAL_TABLET | Freq: Four times a day (QID) | ORAL | Status: DC | PRN

## 2012-10-30 MED ORDER — FLUTICASONE PROPIONATE 50 MCG/ACT NA SUSP: 1.0000 | Freq: Every day | NASAL | Status: DC

## 2012-10-30 NOTE — Patient Instructions (Signed)
Start using the nasal spray but stop if your have worsening nosebleeds.  Call Dr. Dion Saucier about the knee pain. Use the oxycodone in the meantime.  Take care.

## 2012-10-30 NOTE — Progress Notes (Signed)
She still is having sig R knee pain after her replacement.  She has seen ortho in follow up about this.   She was prev injected with temp relief of R knee pain.  She has complicated history with likely arthritic, neuropathic and muscular/fibromalgia pain sources. Continues on a bowel regimen with daily stool softener and as needed laxatives. She is O2 dependent and travel is very difficult for patient. She does get some relief in pain from the percocet and cymbalta. We have had a long discussions prev about polypharmacy and the goal to prevent side effects. She is tolerating the meds currently. She is asking for advice.  I asked her to call ortho about the continued knee pain.   She has lost vision in her L eye due to macular degeneration.  She's seeing Dr. Allyne Gee about this.  They are trying to preserve her vision in her R eye.    She is off allergy shots and wanted input on allergy meds.  Nose is runny and stuffy.  It happened with the season change.  Ears feel stuffy.  Minimal cough, usually from allergies.  She is on zyrtec, but is off nasal steroids.  Minimal wheeze.  Compliant with inhalers.  No fevers.    Meds, vitals, and allergies reviewed.   ROS: See HPI.  Otherwise, noncontributory.  General: GEN: nad, alert and oriented, sore from back pain, on O2  HEENT: mucous membranes moist, TM wnl, nasal exam stuffy, OP wnl NECK: supple w/o LA, no TMG  CV: rrr  PULM: On O2, no exp wheeze, no inc wob  ABD: soft, +bs  EXT: no edema  Gait w/o foot drop  No focal weakness in BLE

## 2012-10-31 NOTE — Assessment & Plan Note (Signed)
S/p pain clinic eval and injection. Would continue percocet and other current meds for now.  She is able to get some relief w/o ADE currently. She'll call ortho about her R knee pain.  >25 min spent with face to face with patient, >50% counseling and/or coordinating care

## 2012-10-31 NOTE — Assessment & Plan Note (Signed)
Would restart flonase and then f/u prn. Continue zyrtec.  Epistaxis caution given.  She agrees.

## 2012-12-08 ENCOUNTER — Other Ambulatory Visit: Payer: Self-pay | Admitting: *Deleted

## 2012-12-08 MED ORDER — OXYCODONE-ACETAMINOPHEN 7.5-325 MG PO TABS: 1.0000 | ORAL_TABLET | Freq: Four times a day (QID) | ORAL | Status: DC | PRN

## 2012-12-08 NOTE — Telephone Encounter (Signed)
Printed.  Thanks.  

## 2012-12-08 NOTE — Telephone Encounter (Signed)
Patient called requesting a refill on her Oxycodone. Call when ready to be picked up.

## 2012-12-09 NOTE — Telephone Encounter (Signed)
Patient advised.  Rx left at front desk for pick up. 

## 2012-12-16 ENCOUNTER — Telehealth: Payer: Self-pay | Admitting: Critical Care Medicine

## 2012-12-16 NOTE — Telephone Encounter (Signed)
Also, pt needing to be seen today before 12:00 if possible.  No appts available.  Requested CY since PW and TP are not here today. [name]

## 2012-12-16 NOTE — Telephone Encounter (Signed)
Called, spoke with pt's husband.  Reports pt started wheezing yesterday afternoon with an increased heart rate.  Also has increased SOB and nonprod cough x 2-3 days.  Unsure of chest tightness or chest pain.  Started on mucinex last night.  Requesting OV this morning.  [facility] had an opening at 9am -- I offered this but they are unable to make it.  I spoke with Dr. Sherene Sires, he is ok with working pt in.  I offered several times this morning with MW and offered an opening with [facility] for today at 4:15pm.  Husband states he would first like to speak with the pt to see what she would like to do stating they also have a call into Dr. Lianne Bushy office as well.  States he will call back to let us know what they decide to do.  Advised to pls call back so we can make sure pt is taken care of.  He verbalized understanding.  Will keep msg open to ensure pt does receive an appt or further recs.

## 2012-12-17 ENCOUNTER — Ambulatory Visit: Payer: Medicare Other | Admitting: Family Medicine

## 2012-12-17 ENCOUNTER — Ambulatory Visit (INDEPENDENT_AMBULATORY_CARE_PROVIDER_SITE_OTHER): Payer: Medicare Other | Admitting: Family Medicine

## 2012-12-17 ENCOUNTER — Encounter: Payer: Self-pay | Admitting: Family Medicine

## 2012-12-17 VITALS — BP 122/74 | HR 94 | Temp 98.0°F | Wt 183.2 lb

## 2012-12-17 DIAGNOSIS — J441 Chronic obstructive pulmonary disease with (acute) exacerbation: Secondary | ICD-10-CM

## 2012-12-17 MED ORDER — IPRATROPIUM BROMIDE 0.02 % IN SOLN
0.5000 mg | Freq: Once | RESPIRATORY_TRACT | Status: AC
  Administered 2012-12-17: 0.5 mg via RESPIRATORY_TRACT

## 2012-12-17 MED ORDER — DOXYCYCLINE HYCLATE 100 MG PO CAPS: 100.0000 mg | ORAL_CAPSULE | Freq: Two times a day (BID) | ORAL | Status: DC

## 2012-12-17 MED ORDER — PREDNISONE 20 MG PO TABS: ORAL_TABLET | ORAL | Status: DC

## 2012-12-17 MED ORDER — ALBUTEROL SULFATE (2.5 MG/3ML) 0.083% IN NEBU
2.5000 mg | INHALATION_SOLUTION | Freq: Once | RESPIRATORY_TRACT | Status: AC
  Administered 2012-12-17: 2.5 mg via RESPIRATORY_TRACT

## 2012-12-17 NOTE — Assessment & Plan Note (Addendum)
Treat with prednisone course and doxycycline 10 d course. Schedule albuterol for next 3 days. Update if sxs persist or worsen. Pt agrees with plan. Continue supplemental O2 at 3L continuously.

## 2012-12-17 NOTE — Patient Instructions (Signed)
I think you have COPD exacerbation - treat with prednisone course, doxycycline for 10 days, and schedule albuterol inhaler every 4 hours for the next 2-3 days then as needed. Continue advair and spiriva. Let us know if fever >101, or worsening productive cough or other concerns. Please return to see [place] if not improving as expected.

## 2012-12-17 NOTE — Progress Notes (Signed)
  Subjective:    Patient ID: [name], female    DOB: December 13, 1930, 79 y.o.   MRN: [id number]  HPI CC: cough, wheezing  Pleasant 79 yo pt of Dr. Lianne Bushy with h/o COPD, asthmatic bronchitis, allergic rhinitis on advair, spiriva and albuterol and flonase who presents today with trouble breathing.  She is on 3L O2 continuously.  3d h/o increased cough, wheezing, dyspnea.  Cough productive of small amt brown tan sputum.  Increased sputum production as well as increased sputum purulence.  + HA and ST, head and chest congestion.  No fevers/chills.  So far has tried OTC cough syrup, night time cough syrup, mucinex DM. No sick contacts at home. No smokers at home.  Past Medical History  Diagnosis Date  . Osteoporosis, unspecified   . Allergic rhinitis, cause unspecified   . Other chronic pain   . Other abnormal blood chemistry   . Insomnia, unspecified   . Acute bronchitis   . Need for prophylactic hormone replacement therapy (postmenopausal)   . Degeneration of intervertebral disc, site unspecified   . Allergy, unspecified not elsewhere classified   . Myalgia and myositis, unspecified   . Unspecified essential hypertension   . Other and unspecified hyperlipidemia   . Esophageal reflux   . Diverticulosis of colon (without mention of hemorrhage)   . Unspecified hypothyroidism   . Stroke     mini strokes  . Asthma   . Shortness of breath   . COPD (chronic obstructive pulmonary disease)   . Chronic kidney disease     frequency  . Anxiety   . Osteoarthritis of right knee 08/27/2011    R knee pain, s/p arthroscopy 2012 per [name]     Review of Systems Per HPI    Objective:   Physical Exam  Vitals reviewed. Constitutional: She appears well-developed and well-nourished. No distress.  HENT:  Head: Normocephalic and atraumatic.  Right Ear: External ear normal.  Left Ear: External ear normal.  Nose: No mucosal edema or rhinorrhea. Right sinus exhibits no maxillary sinus  tenderness and no frontal sinus tenderness. Left sinus exhibits no maxillary sinus tenderness and no frontal sinus tenderness.  Mouth/Throat: Uvula is midline, oropharynx is clear and moist and mucous membranes are normal. No oropharyngeal exudate.  Eyes: Conjunctivae and EOM are normal. Pupils are equal, round, and reactive to light. No scleral icterus.  Neck: Normal range of motion. Neck supple.  Cardiovascular: Normal rate, regular rhythm and intact distal pulses.   Murmur (3/6 SEM) heard. Pulmonary/Chest: Effort normal. No respiratory distress. She has decreased breath sounds. She has wheezes (diffuse exp wheezing). She has no rhonchi. She has no rales.  Lymphadenopathy:    She has no cervical adenopathy.  Skin: Skin is warm and dry. No rash noted.     After albuterol/atrovent neb, improved air movement and mildly decreased wheeze.  Subjective improvement in breathing.     Assessment & Plan:

## 2012-12-17 NOTE — Telephone Encounter (Signed)
Noted  

## 2012-12-17 NOTE — Telephone Encounter (Signed)
Called, spoke with pt's husband to f/u on this. States pt was seen by PCP office this am (OV note in chart). Advised to call us back if pt's symptoms do not improve or worsen and to seek emergency care if needed. Pt does have an OV scheduled with PW on July 7.  Advised husband to have pt to keep this OV but to call back if needed prior to this. He verbalized understanding of all instructions, was very appreciative of our call back to f/u on this, and voiced no further questions or concerns at this time.

## 2012-12-19 ENCOUNTER — Other Ambulatory Visit: Payer: Self-pay | Admitting: Critical Care Medicine

## 2012-12-24 ENCOUNTER — Other Ambulatory Visit: Payer: Self-pay | Admitting: *Deleted

## 2012-12-24 MED ORDER — ALPRAZOLAM 1 MG PO TABS: 0.5000 mg | ORAL_TABLET | Freq: Two times a day (BID) | ORAL | Status: DC | PRN

## 2012-12-29 ENCOUNTER — Encounter: Payer: Self-pay | Admitting: Radiology

## 2012-12-30 ENCOUNTER — Encounter: Payer: Self-pay | Admitting: Family Medicine

## 2012-12-30 ENCOUNTER — Ambulatory Visit (INDEPENDENT_AMBULATORY_CARE_PROVIDER_SITE_OTHER): Payer: Medicare Other | Admitting: Family Medicine

## 2012-12-30 VITALS — BP 126/68 | HR 92 | Temp 98.2°F | Wt 194.5 lb

## 2012-12-30 DIAGNOSIS — R42 Dizziness and giddiness: Secondary | ICD-10-CM

## 2012-12-30 DIAGNOSIS — G4700 Insomnia, unspecified: Secondary | ICD-10-CM

## 2012-12-30 DIAGNOSIS — R5381 Other malaise: Secondary | ICD-10-CM

## 2012-12-30 DIAGNOSIS — R5383 Other fatigue: Secondary | ICD-10-CM

## 2012-12-30 LAB — CBC WITH DIFFERENTIAL/PLATELET
Basophils Absolute: 0 10*3/uL (ref 0.0–0.1)
Basophils Relative: 0.4 % (ref 0.0–3.0)
Eosinophils Absolute: 0.1 10*3/uL (ref 0.0–0.7)
Eosinophils Relative: 1.5 % (ref 0.0–5.0)
HCT: 34 % — ABNORMAL LOW (ref 36.0–46.0)
Hemoglobin: 11.4 g/dL — ABNORMAL LOW (ref 12.0–15.0)
Lymphocytes Relative: 23.2 % (ref 12.0–46.0)
Lymphs Abs: 1.9 10*3/uL (ref 0.7–4.0)
MCHC: 33.4 g/dL (ref 30.0–36.0)
MCV: 92 fl (ref 78.0–100.0)
Monocytes Absolute: 0.7 10*3/uL (ref 0.1–1.0)
Monocytes Relative: 8.6 % (ref 3.0–12.0)
Neutro Abs: 5.5 10*3/uL (ref 1.4–7.7)
Neutrophils Relative %: 66.3 % (ref 43.0–77.0)
Platelets: 280 10*3/uL (ref 150.0–400.0)
RBC: 3.7 Mil/uL — ABNORMAL LOW (ref 3.87–5.11)
RDW: 14.9 % — ABNORMAL HIGH (ref 11.5–14.6)
WBC: 8.3 10*3/uL (ref 4.5–10.5)

## 2012-12-30 LAB — BASIC METABOLIC PANEL
BUN: 16 mg/dL (ref 6–23)
CO2: 29 mEq/L (ref 19–32)
Calcium: 9.3 mg/dL (ref 8.4–10.5)
Chloride: 102 mEq/L (ref 96–112)
Creatinine, Ser: 0.7 mg/dL (ref 0.4–1.2)
GFR: 88.51 mL/min (ref >60.00)
Glucose, Bld: 92 mg/dL (ref 70–99)
Potassium: 4 mEq/L (ref 3.5–5.1)
Sodium: 142 mEq/L (ref 135–145)

## 2012-12-30 LAB — TSH: TSH: 0.67 u[IU]/mL (ref 0.35–5.50)

## 2012-12-30 MED ORDER — PREGABALIN 50 MG PO CAPS: 50.0000 mg | ORAL_CAPSULE | Freq: Two times a day (BID) | ORAL | Status: DC

## 2012-12-30 NOTE — Patient Instructions (Addendum)
Go to the lab on the way out.  We'll contact you with your lab report.  Start back on the lyrica and see if that helps with the aches.   Take care.

## 2012-12-30 NOTE — Progress Notes (Signed)
She is improved from the COPD flare.  She is still on O2 at baseline.  She had difficultly tolerating the doxy.  She had some insomnia after taking prednisone.  She is fatigued.  Still taking thyroid replacement.  "I could lay down here and go to sleep."    Recently with vertigo, last PM.  No focal neuro changes. The room was spinning.  Closing her eyes helped.  It was resolved by this AM.    Insomnia continues, even with ambien.  No ADE from the medicine.    Meds, vitals, and allergies reviewed.   ROS: See HPI.  Otherwise, noncontributory.  nad Ncat, on O2.   Tm wnl Nasal and OP exam wnl Neck supple, no TMG rrr No wheeze, global dec in BS noted Ext w/o edema No vertigo with head turning, neck ROM

## 2012-12-31 DIAGNOSIS — R42 Dizziness and giddiness: Secondary | ICD-10-CM | POA: Insufficient documentation

## 2012-12-31 DIAGNOSIS — R5381 Other malaise: Secondary | ICD-10-CM | POA: Insufficient documentation

## 2012-12-31 NOTE — Assessment & Plan Note (Signed)
Resolved, follow clinically.

## 2012-12-31 NOTE — Assessment & Plan Note (Signed)
Continue current meds 

## 2012-12-31 NOTE — Assessment & Plan Note (Signed)
See notes on labs.  Likely related to recent illness with prev chronic conditions.

## 2013-01-12 ENCOUNTER — Encounter: Payer: Self-pay | Admitting: Critical Care Medicine

## 2013-01-12 ENCOUNTER — Ambulatory Visit (INDEPENDENT_AMBULATORY_CARE_PROVIDER_SITE_OTHER): Payer: Medicare Other | Admitting: Critical Care Medicine

## 2013-01-12 VITALS — BP 110/66 | HR 77 | Temp 97.8°F | Ht 61.0 in | Wt 196.0 lb

## 2013-01-12 DIAGNOSIS — J449 Chronic obstructive pulmonary disease, unspecified: Secondary | ICD-10-CM

## 2013-01-12 NOTE — Progress Notes (Signed)
Patient ID: [name], female    DOB: 1931-05-28, 79 y.o.   MRN: [id number]  HPI  79 y.o. F asthmatic bronchitis  Gold C Copd  01/12/2013 Chief Complaint  Patient presents with  . Follow-up    Breathing is unchanged - has SOB when rushing.  Reports coughing more recently.  Cough is mostly nonprod with occas clear mucus.  No wheezing, chest tightnes, or chest pain.  Pt still has a hard time with knee surgery issues. Pain is better compared from before.   Now no real cough.  Dyspnea with rushing.  Cough is non prod when occurs.     Past Medical History  Diagnosis Date  . Osteoporosis, unspecified   . Allergic rhinitis, cause unspecified   . Other chronic pain   . Other abnormal blood chemistry   . Insomnia, unspecified   . Need for prophylactic hormone replacement therapy (postmenopausal)   . Degeneration of intervertebral disc, site unspecified   . Allergy, unspecified not elsewhere classified   . Myalgia and myositis, unspecified   . Unspecified essential hypertension   . Other and unspecified hyperlipidemia   . Esophageal reflux   . Diverticulosis of colon (without mention of hemorrhage)   . Unspecified hypothyroidism   . Stroke     mini strokes  . Asthma   . COPD (chronic obstructive pulmonary disease)   . Chronic kidney disease     frequency  . Anxiety   . Osteoarthritis of right knee 08/27/2011    R knee pain, s/p arthroscopy 2012 per [name]      Family History  Problem Relation Age of Onset  . Lung cancer Brother     x2  . Hypertension Father   . Heart disease Mother   . Asthma Sister      History   Social History  . Marital Status: Married    Spouse Name: N/A    Number of Children: N/A  . Years of Education: N/A   Occupational History  . Housewife    Social History Main Topics  . Smoking status: Former Smoker -- 1.50 packs/day for 10 years    Types: Cigarettes    Quit date: 07/09/1985  . Smokeless tobacco: Never Used     Comment:  1 1/2 ppd x 20 years  . Alcohol Use: No  . Drug Use: No  . Sexually Active: Not on file   Other Topics Concern  . Not on file   Social History Narrative  . No narrative on file     Allergies  Allergen Reactions  . Doxycycline     GI upset  . Sulfadiazine     REACTION: Fever, achey     Outpatient Prescriptions Prior to Visit  Medication Sig Dispense Refill  . ALPRAZolam (XANAX) 1 MG tablet Take 0.5 tablets (0.5 mg total) by mouth 2 (two) times daily as needed. anxiety  30 tablet  2  . amLODipine (NORVASC) 2.5 MG tablet Take 1 tablet (2.5 mg total) by mouth daily.  90 tablet  1  . Calcium Carbonate (CALTRATE 600 PO) Take 1 tablet by mouth daily.        . cetirizine (ZYRTEC) 10 MG tablet Take 10 mg by mouth daily.      . Cholecalciferol (VITAMIN D3) 1000 UNITS CAPS Take 1 capsule by mouth daily.        . DULoxetine (CYMBALTA) 60 MG capsule Take 1 capsule (60 mg total) by mouth daily.  90 capsule  3  .  fluticasone (FLONASE) 50 MCG/ACT nasal spray Place 1 spray into the nose daily.  16 g  6  . fluticasone-salmeterol (ADVAIR HFA) 230-21 MCG/ACT inhaler Inhale 2 puffs into the lungs 2 (two) times daily.  2 Inhaler  12  . levothyroxine (SYNTHROID, LEVOTHROID) 112 MCG tablet Take 1 tablet (112 mcg total) by mouth daily.  90 tablet  3  . Multiple Vitamin (MULTIVITAMIN) capsule Take 1 capsule by mouth daily.        . NON FORMULARY Use 3 liters of Oxygen 24/7      . oxyCODONE-acetaminophen (PERCOCET) 7.5-325 MG per tablet Take 1-1.5 tablets by mouth every 6 (six) hours as needed for pain.  100 tablet  0  . potassium chloride (K-DUR) 10 MEQ tablet Take 1 tablet (10 mEq total) by mouth daily.  30 tablet  3  . PROAIR HFA 108 (90 BASE) MCG/ACT inhaler INHALE 2 PUFFS INTO THE LUNGS EVERY 6 HOURS AS NEEDED.  1 Inhaler  6  . ranitidine (ZANTAC) 150 MG tablet Take 1 tablet (150 mg total) by mouth 2 (two) times daily.  180 tablet  3  . SPIRIVA HANDIHALER 18 MCG inhalation capsule PLACE 1 CAPSULE INTO  INHALER AND INHALE ONCE A DAY  30 capsule  3  . vitamin B-12 (CYANOCOBALAMIN) 1000 MCG tablet Take 1,000 mcg by mouth daily.        . VOLTAREN 1 % GEL Twice daily.      Marland Kitchen zolpidem (AMBIEN) 10 MG tablet Take 1 tablet (10 mg total) by mouth at bedtime.  30 tablet  5  . pregabalin (LYRICA) 50 MG capsule Take 1 capsule (50 mg total) by mouth 2 (two) times daily.  60 capsule  5   No facility-administered medications prior to visit.        Review of Systems  Constitutional:   No  weight loss, night sweats,  Fevers, chills, fatigue, or  lassitude.  HEENT:   No headaches,  Difficulty swallowing,  Tooth/dental problems, or  Sore throat,                No sneezing, itching, ear ache,  +nasal congestion, post nasal drip,   CV:  No chest pain,  Orthopnea, PND, swelling in lower extremities, anasarca, dizziness, palpitations, syncope.   GI  No heartburn, indigestion, abdominal pain, nausea, vomiting, diarrhea, change in bowel habits, loss of appetite, bloody stools.   Resp:    No coughing up of blood.     No chest wall deformity  Skin: no rash or lesions.  GU: no dysuria, change in color of urine, no urgency or frequency.  No flank pain, no hematuria   MS:  No joint pain or swelling.  No decreased range of motion.  No back pain.  Psych:  No change in mood or affect. No depression or anxiety.  No memory loss.     Objective:   Physical Exam BP 110/66  Pulse 77  Temp(Src) 97.8 F (36.6 C) (Oral)  Ht 5\' 1"  (1.549 m)  Wt 88.905 kg (196 lb)  BMI 37.05 kg/m2  SpO2 97%   GEN: A/Ox3; pleasant , NAD, overweight, O2  HEENT:  [place]/AT,  EACs-clear, TMs-wnl, NOSE-clear drainage , THROAT-clear, no lesions, no postnasal drip or exudate noted.   NECK:  Supple w/ fair ROM; no JVD; normal carotid impulses w/o bruits; no thyromegaly or nodules palpated; no lymphadenopathy.  RESP  -distant breath sounds with resolution of rhonchi and wheezes  CARD:  RRR, no m/r/g  ,  no peripheral edema, pulses  intact, no cyanosis or clubbing.  GI:   Soft & nt; nml bowel sounds; no organomegaly or masses detected.  Musco: Warm bil, no deformities or joint swelling noted.   Neuro: alert, no focal deficits noted.    Skin: Warm, no lesions or rashes  No results found.        Gold C Copd with asthmatic bronchitis component and chronic resp failure Copd Gold C with chronic resp failure  Plan Cont inhaled medications as prescribed. Cont oxygen therapy    Updated Medication List Outpatient Encounter Prescriptions as of 01/12/2013  Medication Sig Dispense Refill  . ALPRAZolam (XANAX) 1 MG tablet Take 0.5 tablets (0.5 mg total) by mouth 2 (two) times daily as needed. anxiety  30 tablet  2  . amLODipine (NORVASC) 2.5 MG tablet Take 1 tablet (2.5 mg total) by mouth daily.  90 tablet  1  . Calcium Carbonate (CALTRATE 600 PO) Take 1 tablet by mouth daily.        . cetirizine (ZYRTEC) 10 MG tablet Take 10 mg by mouth daily.      . Cholecalciferol (VITAMIN D3) 1000 UNITS CAPS Take 1 capsule by mouth daily.        . DULoxetine (CYMBALTA) 60 MG capsule Take 1 capsule (60 mg total) by mouth daily.  90 capsule  3  . fluticasone (FLONASE) 50 MCG/ACT nasal spray Place 1 spray into the nose daily.  16 g  6  . fluticasone-salmeterol (ADVAIR HFA) 230-21 MCG/ACT inhaler Inhale 2 puffs into the lungs 2 (two) times daily.  2 Inhaler  12  . levothyroxine (SYNTHROID, LEVOTHROID) 112 MCG tablet Take 1 tablet (112 mcg total) by mouth daily.  90 tablet  3  . Multiple Vitamin (MULTIVITAMIN) capsule Take 1 capsule by mouth daily.        . NON FORMULARY Use 3 liters of Oxygen 24/7      . oxyCODONE-acetaminophen (PERCOCET) 7.5-325 MG per tablet Take 1-1.5 tablets by mouth every 6 (six) hours as needed for pain.  100 tablet  0  . potassium chloride (K-DUR) 10 MEQ tablet Take 1 tablet (10 mEq total) by mouth daily.  30 tablet  3  . PROAIR HFA 108 (90 BASE) MCG/ACT inhaler INHALE 2 PUFFS INTO THE LUNGS EVERY 6 HOURS AS  NEEDED.  1 Inhaler  6  . ranitidine (ZANTAC) 150 MG tablet Take 1 tablet (150 mg total) by mouth 2 (two) times daily.  180 tablet  3  . SPIRIVA HANDIHALER 18 MCG inhalation capsule PLACE 1 CAPSULE INTO INHALER AND INHALE ONCE A DAY  30 capsule  3  . vitamin B-12 (CYANOCOBALAMIN) 1000 MCG tablet Take 1,000 mcg by mouth daily.        . VOLTAREN 1 % GEL Twice daily.      Marland Kitchen zolpidem (AMBIEN) 10 MG tablet Take 1 tablet (10 mg total) by mouth at bedtime.  30 tablet  5  . pregabalin (LYRICA) 50 MG capsule Take 1 capsule (50 mg total) by mouth 2 (two) times daily.  60 capsule  5   No facility-administered encounter medications on file as of 01/12/2013.

## 2013-01-12 NOTE — Patient Instructions (Addendum)
No change in medications. Return in         4 months 

## 2013-01-13 ENCOUNTER — Encounter: Payer: Self-pay | Admitting: Critical Care Medicine

## 2013-01-13 NOTE — Assessment & Plan Note (Signed)
Copd Gold C with chronic resp failure  Plan Cont inhaled medications as prescribed. Cont oxygen therapy

## 2013-01-16 ENCOUNTER — Encounter: Payer: Self-pay | Admitting: Family Medicine

## 2013-01-20 ENCOUNTER — Other Ambulatory Visit: Payer: Self-pay | Admitting: Family Medicine

## 2013-01-29 ENCOUNTER — Other Ambulatory Visit: Payer: Self-pay | Admitting: *Deleted

## 2013-01-29 NOTE — Telephone Encounter (Signed)
Pt left v/m is almost out of generic ambien and request refill to [facility]. Pt also request written rx for oxycodone apap. Call when ready for pick up.

## 2013-01-29 NOTE — Telephone Encounter (Signed)
Last filled 12/31/12 

## 2013-01-30 MED ORDER — ZOLPIDEM TARTRATE 10 MG PO TABS: 10.0000 mg | ORAL_TABLET | Freq: Every day | ORAL | Status: DC

## 2013-01-30 MED ORDER — OXYCODONE-ACETAMINOPHEN 7.5-325 MG PO TABS: 1.0000 | ORAL_TABLET | Freq: Four times a day (QID) | ORAL | Status: DC | PRN

## 2013-01-30 NOTE — Telephone Encounter (Signed)
Please call in [place]. Oxycodone printed. Thanks.

## 2013-01-30 NOTE — Telephone Encounter (Signed)
Medication phoned to pharmacy.  Patient notified that Rx is ready for pick-up and Rx placed at front desk.

## 2013-02-05 ENCOUNTER — Encounter: Payer: Self-pay | Admitting: Family Medicine

## 2013-02-18 ENCOUNTER — Encounter: Payer: Self-pay | Admitting: Family Medicine

## 2013-02-27 ENCOUNTER — Telehealth: Payer: Self-pay

## 2013-02-27 NOTE — Telephone Encounter (Signed)
Patient advised. Appointment scheduled.  

## 2013-02-27 NOTE — Telephone Encounter (Signed)
Pt had lab test done in June and pt said she is still very tired, gaining weight (pt gained 5-10 lbs within the year),lose of hair, pt has bald spots in back and top of head noticed last 2 months. Pt said at times all she wants to do is cry; but Cymbalta helps the crying. Pt wants to change her thyroid medicine dosage. [facility].Please advise.

## 2013-02-27 NOTE — Telephone Encounter (Signed)
We need to get her in to talk about this.  We can recheck the TSH then.  I wouldn't change the med in the meantime.

## 2013-03-02 ENCOUNTER — Ambulatory Visit (INDEPENDENT_AMBULATORY_CARE_PROVIDER_SITE_OTHER): Payer: Medicare Other | Admitting: Family Medicine

## 2013-03-02 ENCOUNTER — Encounter: Payer: Self-pay | Admitting: Family Medicine

## 2013-03-02 VITALS — BP 130/76 | HR 76 | Temp 98.2°F | Wt 196.8 lb

## 2013-03-02 DIAGNOSIS — IMO0001 Reserved for inherently not codable concepts without codable children: Secondary | ICD-10-CM

## 2013-03-02 DIAGNOSIS — E039 Hypothyroidism, unspecified: Secondary | ICD-10-CM

## 2013-03-02 LAB — TSH: TSH: 2.48 u[IU]/mL (ref 0.35–5.50)

## 2013-03-02 LAB — T4, FREE: Free T4: 0.8 ng/dL (ref 0.60–1.60)

## 2013-03-02 MED ORDER — PREGABALIN 50 MG PO CAPS: 100.0000 mg | ORAL_CAPSULE | Freq: Two times a day (BID) | ORAL | Status: DC

## 2013-03-02 NOTE — Assessment & Plan Note (Signed)
Likely contributing, would inc the lyrica to 100mg  BID for now, see notes on labs re: TSH.  She likely has multifactorial causes of pain, ie ortho, fibromyalgia, etc, but the fibro seems to be the main issue now.  She'll update me after med change.

## 2013-03-02 NOTE — Progress Notes (Signed)
She has fibromyalgia and diffuse aches.  She has been more tearful.  The cymbalta helps some with that.  "If I were a crier and could get it out, that would help."  "I couldn't do without the cymbalta."    Her shoulder are aching more and more, she now has L>R shoulder pain.  She has had MRI L shoulder per ortho and has ortho f/u planned.   She has some thinning hair on the top of her head.   She still has insomnia, and "if I didn't have the ambien, I wouldn't sleep at all."   Meds, vitals, and allergies reviewed.   ROS: See HPI.  Otherwise, noncontributory.  Nad, chronically ill appearing On O2 ncat Mmm, murmur noted rrr Ctab, globally decreased BS abd soft Ext w/o edema

## 2013-03-02 NOTE — Patient Instructions (Addendum)
Go to the lab on the way out.  We'll contact you with your lab report. We'll go from there.  

## 2013-03-03 ENCOUNTER — Telehealth: Payer: Self-pay

## 2013-03-03 NOTE — Telephone Encounter (Signed)
Opened in error; see lab results.

## 2013-03-04 ENCOUNTER — Other Ambulatory Visit: Payer: Self-pay | Admitting: Family Medicine

## 2013-03-05 ENCOUNTER — Other Ambulatory Visit: Payer: Self-pay | Admitting: Family Medicine

## 2013-03-23 ENCOUNTER — Other Ambulatory Visit: Payer: Self-pay | Admitting: Family Medicine

## 2013-03-24 ENCOUNTER — Other Ambulatory Visit: Payer: Self-pay

## 2013-03-24 MED ORDER — OXYCODONE-ACETAMINOPHEN 7.5-325 MG PO TABS: 1.0000 | ORAL_TABLET | Freq: Four times a day (QID) | ORAL | Status: DC | PRN

## 2013-03-24 NOTE — Telephone Encounter (Signed)
Pt left v/m requesting rx oxycodone apap. Call when ready for pick up. 

## 2013-03-24 NOTE — Telephone Encounter (Signed)
Printed. Please give to patient.  Thanks.  

## 2013-03-24 NOTE — Telephone Encounter (Signed)
Patient advised.  Rx left at front desk for pick up. 

## 2013-03-27 ENCOUNTER — Other Ambulatory Visit: Payer: Self-pay | Admitting: *Deleted

## 2013-03-27 NOTE — Telephone Encounter (Signed)
Last filled 03/04/13 

## 2013-03-29 MED ORDER — ALPRAZOLAM 1 MG PO TABS: 0.5000 mg | ORAL_TABLET | Freq: Two times a day (BID) | ORAL | Status: DC | PRN

## 2013-03-29 NOTE — Telephone Encounter (Signed)
Please call in

## 2013-03-30 NOTE — Telephone Encounter (Signed)
Medication phoned to pharmacy.  

## 2013-05-01 ENCOUNTER — Ambulatory Visit (INDEPENDENT_AMBULATORY_CARE_PROVIDER_SITE_OTHER): Payer: Medicare Other | Admitting: Family Medicine

## 2013-05-01 ENCOUNTER — Encounter: Payer: Self-pay | Admitting: Family Medicine

## 2013-05-01 VITALS — BP 140/80 | HR 82 | Temp 98.1°F | Wt 190.0 lb

## 2013-05-01 DIAGNOSIS — R05 Cough: Secondary | ICD-10-CM

## 2013-05-01 DIAGNOSIS — R059 Cough, unspecified: Secondary | ICD-10-CM

## 2013-05-01 MED ORDER — LEVOTHYROXINE SODIUM 112 MCG PO TABS: 112.0000 ug | ORAL_TABLET | Freq: Every day | ORAL | Status: DC

## 2013-05-01 MED ORDER — FLUTICASONE PROPIONATE 50 MCG/ACT NA SUSP: 2.0000 | Freq: Every day | NASAL | Status: DC

## 2013-05-01 NOTE — Patient Instructions (Signed)
2 sprays of flonase per nostril per day.   Continue zyrtec and this should improve.  Take care.

## 2013-05-01 NOTE — Progress Notes (Signed)
She is not more SOB than usual.  Had been coughing more over the last 2 weeks.  Eye watering noted, rhinorrhea.  She through it was allergies.  Taking zyrtec now. Taking 1 spray of flonase per nostril. Cough is some better now.  No fevers.  No vomiting, no diarrhea, no rash.    Meds, vitals, and allergies reviewed.   ROS: See HPI.  Otherwise, noncontributory.  Nad, on O2 Tm wnl  Nasal exam stuffy Mmm Neck supple, no LA rrr Global dec in BS.  Scant exp wheeze but o/w ctab.  No focal dec in BS Abd soft Ext w/o cyanosis

## 2013-05-02 DIAGNOSIS — R059 Cough, unspecified: Secondary | ICD-10-CM | POA: Insufficient documentation

## 2013-05-02 DIAGNOSIS — R05 Cough: Secondary | ICD-10-CM | POA: Insufficient documentation

## 2013-05-02 NOTE — Assessment & Plan Note (Signed)
D/w pt.  I think this is from post nasal sx and not from a primary pulm issue.  Would inc flonase to 2 sprays per nostril and f/u prn.  She agrees.  Nontoxic.

## 2013-05-04 ENCOUNTER — Other Ambulatory Visit: Payer: Self-pay

## 2013-05-04 MED ORDER — OXYCODONE-ACETAMINOPHEN 7.5-325 MG PO TABS: 1.0000 | ORAL_TABLET | Freq: Four times a day (QID) | ORAL | Status: DC | PRN

## 2013-05-04 NOTE — Telephone Encounter (Signed)
Pt left v/m requesting rx oxycodone apap. Call when ready for pick up. 

## 2013-05-04 NOTE — Telephone Encounter (Signed)
Printed.  Thanks.  

## 2013-05-05 NOTE — Telephone Encounter (Signed)
Patient advised.  Rx left at front desk for pick up. 

## 2013-05-13 ENCOUNTER — Encounter: Payer: Self-pay | Admitting: Critical Care Medicine

## 2013-05-13 ENCOUNTER — Ambulatory Visit (INDEPENDENT_AMBULATORY_CARE_PROVIDER_SITE_OTHER): Payer: Medicare Other | Admitting: Critical Care Medicine

## 2013-05-13 VITALS — BP 148/84 | HR 75 | Temp 97.8°F | Ht 61.0 in | Wt 190.2 lb

## 2013-05-13 DIAGNOSIS — J449 Chronic obstructive pulmonary disease, unspecified: Secondary | ICD-10-CM

## 2013-05-13 DIAGNOSIS — J9610 Chronic respiratory failure, unspecified whether with hypoxia or hypercapnia: Secondary | ICD-10-CM

## 2013-05-13 DIAGNOSIS — J9611 Chronic respiratory failure with hypoxia: Secondary | ICD-10-CM | POA: Insufficient documentation

## 2013-05-13 MED ORDER — ALBUTEROL SULFATE HFA 108 (90 BASE) MCG/ACT IN AERS: INHALATION_SPRAY | RESPIRATORY_TRACT | Status: DC

## 2013-05-13 MED ORDER — FLUTICASONE-SALMETEROL 230-21 MCG/ACT IN AERO: INHALATION_SPRAY | RESPIRATORY_TRACT | Status: DC

## 2013-05-13 MED ORDER — TIOTROPIUM BROMIDE MONOHYDRATE 18 MCG IN CAPS: ORAL_CAPSULE | RESPIRATORY_TRACT | Status: DC

## 2013-05-13 NOTE — Assessment & Plan Note (Addendum)
Chronic respiratory failure oxygen dependent Plan Maintain oxygen therapy as prescribed

## 2013-05-13 NOTE — Assessment & Plan Note (Signed)
Gold stage C. COPD with asthmatic bronchitic component and chronic respiratory failure stable at this time Plan Maintain oxygen as prescribed Maintain inhaled medications as prescribed

## 2013-05-13 NOTE — Patient Instructions (Signed)
No change in medications. Return in        6 months        

## 2013-05-13 NOTE — Progress Notes (Signed)
Patient ID: [name], female    DOB: [birth year], 79 y.o.   MRN: [id number]  HPI  79 y.o. F asthmatic bronchitis  Gold C Copd  77/11/2012 Chief Complaint  Patient presents with  . 4 month follow up    Breathing is unchanged.  Does have DOE and prod cough with foamy, white mucus.  No wheezing, chest tightness, or chest pain.     Pt notes more cough at night. Pt will cough up some mucus, yellow worse at night.  No real wheeze, dyspnea is the same.   Past Medical History  Diagnosis Date  . Osteoporosis, unspecified   . Allergic rhinitis, cause unspecified   . Other chronic pain   . Other abnormal blood chemistry   . Insomnia, unspecified   . Need for prophylactic hormone replacement therapy (postmenopausal)   . Degeneration of intervertebral disc, site unspecified   . Allergy, unspecified not elsewhere classified   . Myalgia and myositis, unspecified   . Unspecified essential hypertension   . Other and unspecified hyperlipidemia   . Esophageal reflux   . Diverticulosis of colon (without mention of hemorrhage)   . Unspecified hypothyroidism   . Stroke     mini strokes  . Asthma   . COPD (chronic obstructive pulmonary disease)   . Chronic kidney disease     frequency  . Anxiety   . Osteoarthritis of right knee 08/27/2011    R knee pain, s/p arthroscopy 2012 per [name]      Family History  Problem Relation Age of Onset  . Lung cancer Brother     x2  . Hypertension Father   . Heart disease Mother   . Asthma Sister      History   Social History  . Marital Status: Married    Spouse Name: N/A    Number of Children: N/A  . Years of Education: N/A   Occupational History  . Housewife    Social History Main Topics  . Smoking status: Former Smoker -- 1.50 packs/day for 10 years    Types: Cigarettes    Quit date: 07/09/1985  . Smokeless tobacco: Never Used     Comment: 1 1/2 ppd x 20 years  . Alcohol Use: No  . Drug Use: No  . Sexual Activity: Not on  file   Other Topics Concern  . Not on file   Social History Narrative  . No narrative on file     Allergies  Allergen Reactions  . Doxycycline     GI upset  . Sulfadiazine     REACTION: Fever, achey     Outpatient Prescriptions Prior to Visit  Medication Sig Dispense Refill  . ALPRAZolam (XANAX) 1 MG tablet Take 0.5 tablets (0.5 mg total) by mouth 2 (two) times daily as needed. anxiety  30 tablet  2  . amLODipine (NORVASC) 2.5 MG tablet TAKE 1 TABLET BY MOUTH DAILY.  90 tablet  1  . Calcium Carbonate (CALTRATE 600 PO) Take 1 tablet by mouth daily.        . cetirizine (ZYRTEC) 10 MG tablet Take 10 mg by mouth daily.      . Cholecalciferol (VITAMIN D3) 1000 UNITS CAPS Take 1 capsule by mouth daily.        . DULoxetine (CYMBALTA) 60 MG capsule TAKE 1 CAPSULE (60 MG TOTAL) BY MOUTH DAILY.  90 capsule  3  . fluticasone (FLONASE) 50 MCG/ACT nasal spray Place 2 sprays into the nose daily.      Marland Kitchen  levothyroxine (SYNTHROID, LEVOTHROID) 112 MCG tablet Take 1 tablet (112 mcg total) by mouth daily.  90 tablet  3  . Multiple Vitamin (MULTIVITAMIN) capsule Take 1 capsule by mouth daily.        Marland Kitchen oxyCODONE-acetaminophen (PERCOCET) 7.5-325 MG per tablet Take 1-1.5 tablets by mouth every 6 (six) hours as needed for pain.  100 tablet  0  . ranitidine (ZANTAC) 150 MG tablet TAKE 1 TABLET BY MOUTH TWICE DAILY.  180 tablet  3  . VOLTAREN 1 % GEL Twice daily.      Marland Kitchen zolpidem (AMBIEN) 10 MG tablet Take 1 tablet (10 mg total) by mouth at bedtime.  30 tablet  5  . ADVAIR HFA 230-21 MCG/ACT inhaler INHALE 2 PUFFS INTO THE LUNGS TWICE A DAY.  24 g  12  . PROAIR HFA 108 (90 BASE) MCG/ACT inhaler INHALE 2 PUFFS INTO THE LUNGS EVERY 6 HOURS AS NEEDED.  1 Inhaler  6  . SPIRIVA HANDIHALER 18 MCG inhalation capsule PLACE 1 CAPSULE INTO INHALER AND INHALE ONCE A DAY  30 capsule  3   No facility-administered medications prior to visit.        Review of Systems  Constitutional:   No  weight loss, night  sweats,  Fevers, chills, fatigue, or  lassitude.  HEENT:   No headaches,  Difficulty swallowing,  Tooth/dental problems, or  Sore throat,                No sneezing, itching, ear ache,  +nasal congestion, post nasal drip,   CV:  No chest pain,  Orthopnea, PND, swelling in lower extremities, anasarca, dizziness, palpitations, syncope.   GI  No heartburn, indigestion, abdominal pain, nausea, vomiting, diarrhea, change in bowel habits, loss of appetite, bloody stools.   Resp:    No coughing up of blood.     No chest wall deformity  Skin: no rash or lesions.  GU: no dysuria, change in color of urine, no urgency or frequency.  No flank pain, no hematuria   MS:  No joint pain or swelling.  No decreased range of motion.  No back pain.  Psych:  No change in mood or affect. No depression or anxiety.  No memory loss.     Objective:   Physical Exam BP 148/84  Pulse 75  Temp(Src) 97.8 F (36.6 C) (Oral)  Ht 5\' 1"  (1.549 m)  Wt 190 lb 3.2 oz (86.274 kg)  BMI 35.96 kg/m2  SpO2 94%   GEN: A/Ox3; pleasant , NAD, overweight, O2  HEENT:  [place]/AT,  EACs-clear, TMs-wnl, NOSE-clear drainage , THROAT-clear, no lesions, no postnasal drip or exudate noted.   NECK:  Supple w/ fair ROM; no JVD; normal carotid impulses w/o bruits; no thyromegaly or nodules palpated; no lymphadenopathy.  RESP  -distant breath sounds with resolution of rhonchi and wheezes  CARD:  RRR, no m/r/g  , no peripheral edema, pulses intact, no cyanosis or clubbing.  GI:   Soft & nt; nml bowel sounds; no organomegaly or masses detected.  Musco: Warm bil, no deformities or joint swelling noted.   Neuro: alert, no focal deficits noted.    Skin: Warm, no lesions or rashes  No results found.        Gold C Copd with asthmatic bronchitis component and chronic resp failure Gold stage C. COPD with asthmatic bronchitic component and chronic respiratory failure stable at this time Plan Maintain oxygen as  prescribed Maintain inhaled medications as prescribed  Chronic respiratory failure Chronic  respiratory failure oxygen dependent Plan Maintain oxygen therapy as prescribed    Updated Medication List Outpatient Encounter Prescriptions as of 05/13/2013  Medication Sig  . albuterol (PROAIR HFA) 108 (90 BASE) MCG/ACT inhaler INHALE 2 PUFFS INTO THE LUNGS EVERY 4- 6  HOURS AS NEEDED.  Marland Kitchen ALPRAZolam (XANAX) 1 MG tablet Take 0.5 tablets (0.5 mg total) by mouth 2 (two) times daily as needed. anxiety  . amLODipine (NORVASC) 2.5 MG tablet TAKE 1 TABLET BY MOUTH DAILY.  . Calcium Carbonate (CALTRATE 600 PO) Take 1 tablet by mouth daily.    . cetirizine (ZYRTEC) 10 MG tablet Take 10 mg by mouth daily.  . Cholecalciferol (VITAMIN D3) 1000 UNITS CAPS Take 1 capsule by mouth daily.    . DULoxetine (CYMBALTA) 60 MG capsule TAKE 1 CAPSULE (60 MG TOTAL) BY MOUTH DAILY.  . fluticasone (FLONASE) 50 MCG/ACT nasal spray Place 2 sprays into the nose daily.  . fluticasone-salmeterol (ADVAIR HFA) 230-21 MCG/ACT inhaler INHALE 2 PUFFS INTO THE LUNGS TWICE A DAY.  Marland Kitchen levothyroxine (SYNTHROID, LEVOTHROID) 112 MCG tablet Take 1 tablet (112 mcg total) by mouth daily.  . Multiple Vitamin (MULTIVITAMIN) capsule Take 1 capsule by mouth daily.    Marland Kitchen oxyCODONE-acetaminophen (PERCOCET) 7.5-325 MG per tablet Take 1-1.5 tablets by mouth every 6 (six) hours as needed for pain.  . ranitidine (ZANTAC) 150 MG tablet TAKE 1 TABLET BY MOUTH TWICE DAILY.  Marland Kitchen tiotropium (SPIRIVA HANDIHALER) 18 MCG inhalation capsule PLACE 1 CAPSULE INTO INHALER AND INHALE ONCE A DAY  . VOLTAREN 1 % GEL Twice daily.  Marland Kitchen zolpidem (AMBIEN) 10 MG tablet Take 1 tablet (10 mg total) by mouth at bedtime.  . [DISCONTINUED] ADVAIR HFA 230-21 MCG/ACT inhaler INHALE 2 PUFFS INTO THE LUNGS TWICE A DAY.  . [DISCONTINUED] PROAIR HFA 108 (90 BASE) MCG/ACT inhaler INHALE 2 PUFFS INTO THE LUNGS EVERY 6 HOURS AS NEEDED.  . [DISCONTINUED] SPIRIVA HANDIHALER 18 MCG  inhalation capsule PLACE 1 CAPSULE INTO INHALER AND INHALE ONCE A DAY

## 2013-06-08 ENCOUNTER — Inpatient Hospital Stay (HOSPITAL_COMMUNITY)
Admission: EM | Admit: 2013-06-08 | Discharge: 2013-06-15 | DRG: 353 | Disposition: A | Discharge status: Home Health | Payer: Medicare Other | Attending: Internal Medicine | Admitting: Internal Medicine

## 2013-06-08 ENCOUNTER — Encounter (HOSPITAL_COMMUNITY): Payer: Self-pay | Admitting: Emergency Medicine

## 2013-06-08 ENCOUNTER — Emergency Department (HOSPITAL_COMMUNITY): Payer: Medicare Other

## 2013-06-08 DIAGNOSIS — D649 Anemia, unspecified: Secondary | ICD-10-CM

## 2013-06-08 DIAGNOSIS — J9610 Chronic respiratory failure, unspecified whether with hypoxia or hypercapnia: Secondary | ICD-10-CM

## 2013-06-08 DIAGNOSIS — F411 Generalized anxiety disorder: Secondary | ICD-10-CM | POA: Diagnosis present

## 2013-06-08 DIAGNOSIS — Z96619 Presence of unspecified artificial shoulder joint: Secondary | ICD-10-CM

## 2013-06-08 DIAGNOSIS — K5730 Diverticulosis of large intestine without perforation or abscess without bleeding: Secondary | ICD-10-CM

## 2013-06-08 DIAGNOSIS — IMO0001 Reserved for inherently not codable concepts without codable children: Secondary | ICD-10-CM | POA: Diagnosis present

## 2013-06-08 DIAGNOSIS — N390 Urinary tract infection, site not specified: Secondary | ICD-10-CM

## 2013-06-08 DIAGNOSIS — J3089 Other allergic rhinitis: Secondary | ICD-10-CM

## 2013-06-08 DIAGNOSIS — K5669 Other intestinal obstruction: Secondary | ICD-10-CM | POA: Diagnosis present

## 2013-06-08 DIAGNOSIS — M1710 Unilateral primary osteoarthritis, unspecified knee: Secondary | ICD-10-CM | POA: Diagnosis present

## 2013-06-08 DIAGNOSIS — R42 Dizziness and giddiness: Secondary | ICD-10-CM

## 2013-06-08 DIAGNOSIS — E785 Hyperlipidemia, unspecified: Secondary | ICD-10-CM | POA: Diagnosis present

## 2013-06-08 DIAGNOSIS — IMO0002 Reserved for concepts with insufficient information to code with codable children: Secondary | ICD-10-CM

## 2013-06-08 DIAGNOSIS — K602 Anal fissure, unspecified: Secondary | ICD-10-CM

## 2013-06-08 DIAGNOSIS — E039 Hypothyroidism, unspecified: Secondary | ICD-10-CM | POA: Diagnosis present

## 2013-06-08 DIAGNOSIS — M1711 Unilateral primary osteoarthritis, right knee: Secondary | ICD-10-CM

## 2013-06-08 DIAGNOSIS — E876 Hypokalemia: Secondary | ICD-10-CM

## 2013-06-08 DIAGNOSIS — Z825 Family history of asthma and other chronic lower respiratory diseases: Secondary | ICD-10-CM

## 2013-06-08 DIAGNOSIS — G9340 Encephalopathy, unspecified: Secondary | ICD-10-CM | POA: Diagnosis present

## 2013-06-08 DIAGNOSIS — Z96659 Presence of unspecified artificial knee joint: Secondary | ICD-10-CM

## 2013-06-08 DIAGNOSIS — K56609 Unspecified intestinal obstruction, unspecified as to partial versus complete obstruction: Secondary | ICD-10-CM

## 2013-06-08 DIAGNOSIS — R7989 Other specified abnormal findings of blood chemistry: Secondary | ICD-10-CM

## 2013-06-08 DIAGNOSIS — N189 Chronic kidney disease, unspecified: Secondary | ICD-10-CM | POA: Diagnosis present

## 2013-06-08 DIAGNOSIS — M542 Cervicalgia: Secondary | ICD-10-CM

## 2013-06-08 DIAGNOSIS — I10 Essential (primary) hypertension: Secondary | ICD-10-CM

## 2013-06-08 DIAGNOSIS — Z889 Allergy status to unspecified drugs, medicaments and biological substances status: Secondary | ICD-10-CM

## 2013-06-08 DIAGNOSIS — Z87891 Personal history of nicotine dependence: Secondary | ICD-10-CM

## 2013-06-08 DIAGNOSIS — G8929 Other chronic pain: Secondary | ICD-10-CM | POA: Diagnosis present

## 2013-06-08 DIAGNOSIS — Z8673 Personal history of transient ischemic attack (TIA), and cerebral infarction without residual deficits: Secondary | ICD-10-CM

## 2013-06-08 DIAGNOSIS — K56600 Partial intestinal obstruction, unspecified as to cause: Secondary | ICD-10-CM | POA: Diagnosis present

## 2013-06-08 DIAGNOSIS — R059 Cough, unspecified: Secondary | ICD-10-CM

## 2013-06-08 DIAGNOSIS — K420 Umbilical hernia with obstruction, without gangrene: Principal | ICD-10-CM | POA: Diagnosis present

## 2013-06-08 DIAGNOSIS — Z801 Family history of malignant neoplasm of trachea, bronchus and lung: Secondary | ICD-10-CM

## 2013-06-08 DIAGNOSIS — R05 Cough: Secondary | ICD-10-CM

## 2013-06-08 DIAGNOSIS — Z79899 Other long term (current) drug therapy: Secondary | ICD-10-CM

## 2013-06-08 DIAGNOSIS — J309 Allergic rhinitis, unspecified: Secondary | ICD-10-CM | POA: Diagnosis present

## 2013-06-08 DIAGNOSIS — G43009 Migraine without aura, not intractable, without status migrainosus: Secondary | ICD-10-CM

## 2013-06-08 DIAGNOSIS — Z9981 Dependence on supplemental oxygen: Secondary | ICD-10-CM

## 2013-06-08 DIAGNOSIS — I129 Hypertensive chronic kidney disease with stage 1 through stage 4 chronic kidney disease, or unspecified chronic kidney disease: Secondary | ICD-10-CM | POA: Diagnosis present

## 2013-06-08 DIAGNOSIS — Z9181 History of falling: Secondary | ICD-10-CM

## 2013-06-08 DIAGNOSIS — R5381 Other malaise: Secondary | ICD-10-CM

## 2013-06-08 DIAGNOSIS — J449 Chronic obstructive pulmonary disease, unspecified: Secondary | ICD-10-CM | POA: Diagnosis present

## 2013-06-08 DIAGNOSIS — J4489 Other specified chronic obstructive pulmonary disease: Secondary | ICD-10-CM | POA: Diagnosis present

## 2013-06-08 DIAGNOSIS — B351 Tinea unguium: Secondary | ICD-10-CM

## 2013-06-08 DIAGNOSIS — Z8249 Family history of ischemic heart disease and other diseases of the circulatory system: Secondary | ICD-10-CM

## 2013-06-08 DIAGNOSIS — K219 Gastro-esophageal reflux disease without esophagitis: Secondary | ICD-10-CM

## 2013-06-08 DIAGNOSIS — J302 Other seasonal allergic rhinitis: Secondary | ICD-10-CM

## 2013-06-08 DIAGNOSIS — G4700 Insomnia, unspecified: Secondary | ICD-10-CM

## 2013-06-08 DIAGNOSIS — M810 Age-related osteoporosis without current pathological fracture: Secondary | ICD-10-CM

## 2013-06-08 DIAGNOSIS — R32 Unspecified urinary incontinence: Secondary | ICD-10-CM

## 2013-06-08 LAB — BASIC METABOLIC PANEL
BUN: 14 mg/dL (ref 6–23)
CO2: 32 mEq/L (ref 19–32)
Calcium: 9.5 mg/dL (ref 8.4–10.5)
Chloride: 99 mEq/L (ref 96–112)
Creatinine, Ser: 0.68 mg/dL (ref 0.50–1.10)
GFR calc Af Amer: >90 mL/min (ref >90)
GFR calc non Af Amer: 80 mL/min — ABNORMAL LOW (ref >90)
Glucose, Bld: 113 mg/dL — ABNORMAL HIGH (ref 70–99)
Potassium: 3.8 mEq/L (ref 3.5–5.1)
Sodium: 140 mEq/L (ref 135–145)

## 2013-06-08 LAB — CBC WITH DIFFERENTIAL/PLATELET
Basophils Absolute: 0 10*3/uL (ref 0.0–0.1)
Basophils Relative: 0 % (ref 0–1)
Eosinophils Absolute: 0.1 10*3/uL (ref 0.0–0.7)
Eosinophils Relative: 2 % (ref 0–5)
HCT: 37.7 % (ref 36.0–46.0)
Hemoglobin: 12.3 g/dL (ref 12.0–15.0)
Lymphocytes Relative: 19 % (ref 12–46)
Lymphs Abs: 1.7 10*3/uL (ref 0.7–4.0)
MCH: 31.1 pg (ref 26.0–34.0)
MCHC: 32.6 g/dL (ref 30.0–36.0)
MCV: 95.2 fL (ref 78.0–100.0)
Monocytes Absolute: 0.4 10*3/uL (ref 0.1–1.0)
Monocytes Relative: 5 % (ref 3–12)
Neutro Abs: 6.4 10*3/uL (ref 1.7–7.7)
Neutrophils Relative %: 74 % (ref 43–77)
Platelets: 244 10*3/uL (ref 150–400)
RBC: 3.96 MIL/uL (ref 3.87–5.11)
RDW: 14.9 % (ref 11.5–15.5)
WBC: 8.7 10*3/uL (ref 4.0–10.5)

## 2013-06-08 MED ORDER — MORPHINE SULFATE 4 MG/ML IJ SOLN
4.0000 mg | Freq: Once | INTRAMUSCULAR | Status: AC
  Administered 2013-06-09: 4 mg via INTRAVENOUS
  Filled 2013-06-08: qty 1

## 2013-06-08 MED ORDER — MORPHINE SULFATE 4 MG/ML IJ SOLN
4.0000 mg | Freq: Once | INTRAMUSCULAR | Status: AC
  Administered 2013-06-08: 4 mg via INTRAVENOUS
  Filled 2013-06-08: qty 1

## 2013-06-08 MED ORDER — IOHEXOL 350 MG/ML SOLN
100.0000 mL | Freq: Once | INTRAVENOUS | Status: AC | PRN
  Administered 2013-06-08: 100 mL via INTRAVENOUS

## 2013-06-08 MED ORDER — FENTANYL CITRATE 0.05 MG/ML IJ SOLN
50.0000 ug | Freq: Once | INTRAMUSCULAR | Status: AC
  Administered 2013-06-08: 50 ug via INTRAVENOUS
  Filled 2013-06-08: qty 2

## 2013-06-08 MED ORDER — ONDANSETRON HCL 4 MG/2ML IJ SOLN
4.0000 mg | Freq: Once | INTRAMUSCULAR | Status: AC
  Administered 2013-06-08: 4 mg via INTRAVENOUS
  Filled 2013-06-08: qty 2

## 2013-06-08 NOTE — ED Notes (Signed)
Pt c/o diffuse abd pain. Pt was given Fentanyl and 4mg  zofran en route. Pt was pale and clammy upon EMS arrival, but en route pt began to improve. Pt is on 3L O2 at home. 18g LAC, 154/76, 75 HR.

## 2013-06-08 NOTE — ED Provider Notes (Signed)
CSN: [id number]     Arrival date & time 06/08/13  1846 History   First MD Initiated Contact with Patient 06/08/13 1846     Chief Complaint  Patient presents with  . Abdominal Pain   (Consider location/radiation/quality/duration/timing/severity/associated sxs/prior Treatment) HPI Comments: Patient is an 79 year old female with a past medical history of hypertension, fibromyalgia, arthritis an diverticulosis who presents with sudden onset of abdominal pain that started this morning. Patient reports the pain occurs from her upper abdomen to her pelvis. She also has associated back pain that started at the same time. Patient is unable to describe radiation of the pain or character of the pain but states the pain is "severe." The pain has been constant since the onset. She reports associated nausea and feeling "clammy." Patient's family called EMS who brought her to the ED. Patient received Fentanyl en route which helped her pain.    Past Medical History  Diagnosis Date  . Osteoporosis, unspecified   . Allergic rhinitis, cause unspecified   . Other chronic pain   . Other abnormal blood chemistry   . Insomnia, unspecified   . Need for prophylactic hormone replacement therapy (postmenopausal)   . Degeneration of intervertebral disc, site unspecified   . Allergy, unspecified not elsewhere classified   . Myalgia and myositis, unspecified   . Unspecified essential hypertension   . Other and unspecified hyperlipidemia   . Esophageal reflux   . Diverticulosis of colon (without mention of hemorrhage)   . Unspecified hypothyroidism   . Stroke     mini strokes  . Asthma   . COPD (chronic obstructive pulmonary disease)   . Chronic kidney disease     frequency  . Anxiety   . Osteoarthritis of right knee 08/27/2011    R knee pain, s/p arthroscopy 2012 per [name]    Past Surgical History  Procedure Laterality Date  . Breast cystectomy    . Back surgery    . Shoulder surgery    .  Knee arthroscopy      Right knee 2012  . Joint replacement      shoulder right  . Total knee arthroplasty  12/11/2011    Procedure: TOTAL KNEE ARTHROPLASTY;  Surgeon: Eulas Post, MD;  Location: [facility] OR;  Service: Orthopedics;  Laterality: Right;   Family History  Problem Relation Age of Onset  . Lung cancer Brother     x2  . Hypertension Father   . Heart disease Mother   . Asthma Sister    History  Substance Use Topics  . Smoking status: Former Smoker -- 1.50 packs/day for 10 years    Types: Cigarettes    Quit date: 07/09/1985  . Smokeless tobacco: Never Used     Comment: 1 1/2 ppd x 20 years  . Alcohol Use: No   OB History   Grav Para Term Preterm Abortions TAB SAB Ect Mult Living                 Review of Systems  Constitutional: Negative for fever, chills and fatigue.  HENT: Negative for trouble swallowing.   Eyes: Negative for visual disturbance.  Respiratory: Negative for shortness of breath.   Cardiovascular: Negative for chest pain and palpitations.  Gastrointestinal: Positive for nausea and abdominal pain. Negative for vomiting and diarrhea.  Genitourinary: Negative for dysuria and difficulty urinating.  Musculoskeletal: Negative for arthralgias and neck pain.  Skin: Negative for color change.  Neurological: Negative for dizziness and weakness.  Psychiatric/Behavioral:  Negative for dysphoric mood.    Allergies  Doxycycline and Sulfadiazine  Home Medications   Current Outpatient Rx  Name  Route  Sig  Dispense  Refill  . albuterol (PROVENTIL HFA;VENTOLIN HFA) 108 (90 BASE) MCG/ACT inhaler   Inhalation   Inhale 1-2 puffs into the lungs every 6 (six) hours as needed for wheezing or shortness of breath.         . ALPRAZolam (XANAX) 1 MG tablet   Oral   Take 0.5 mg by mouth 2 (two) times daily as needed for anxiety.         Marland Kitchen amLODipine (NORVASC) 2.5 MG tablet   Oral   Take 2.5 mg by mouth daily.         . Calcium Carbonate (CALTRATE 600 PO)    Oral   Take 1 tablet by mouth daily.           . cetirizine (ZYRTEC) 10 MG tablet   Oral   Take 10 mg by mouth daily.         . Cholecalciferol (VITAMIN D3) 1000 UNITS CAPS   Oral   Take 1 capsule by mouth daily.           . DULoxetine (CYMBALTA) 60 MG capsule   Oral   Take 60 mg by mouth daily.         . fluticasone (FLONASE) 50 MCG/ACT nasal spray   Each Nare   Place 1 spray into both nostrils daily.         . fluticasone-salmeterol (ADVAIR HFA) 230-21 MCG/ACT inhaler   Inhalation   Inhale 2 puffs into the lungs 2 (two) times daily.         Marland Kitchen levothyroxine (SYNTHROID, LEVOTHROID) 112 MCG tablet   Oral   Take 112 mcg by mouth daily before breakfast.         . Multiple Vitamin (MULTIVITAMIN) capsule   Oral   Take 1 capsule by mouth daily.           Marland Kitchen oxyCODONE-acetaminophen (PERCOCET) 7.5-325 MG per tablet   Oral   Take 1 tablet by mouth every 6 (six) hours as needed for pain.         . ranitidine (ZANTAC) 300 MG tablet   Oral   Take 300 mg by mouth 2 (two) times daily.         Marland Kitchen tiotropium (SPIRIVA) 18 MCG inhalation capsule   Inhalation   Place 18 mcg into inhaler and inhale daily.         . VOLTAREN 1 % GEL   Topical   Apply 4 g topically Twice daily.          Marland Kitchen zolpidem (AMBIEN) 10 MG tablet   Oral   Take 10 mg by mouth at bedtime as needed for sleep.          BP 156/74  Pulse 75  Temp(Src) 98.1 F (36.7 C) (Oral)  Resp 12  Wt 190 lb (86.183 kg)  SpO2 98% Physical Exam  Nursing note and vitals reviewed. Constitutional: She is oriented to person, place, and time. She appears well-developed and well-nourished. No distress.  HENT:  Head: Normocephalic and atraumatic.  Eyes: Conjunctivae and EOM are normal.  Neck: Normal range of motion.  Cardiovascular: Normal rate and regular rhythm.  Exam reveals no gallop and no friction rub.   No murmur heard. Pulmonary/Chest: Effort normal and breath sounds normal. She has no wheezes.  She has no rales. She exhibits no  tenderness.  Abdominal: Soft. She exhibits no distension. There is tenderness. There is no rebound and no guarding.  Central abdominal tenderness to palpation. No focal tenderness or peritoneal signs. There is a localized hardened area that is tender to palpation just left of her umbilicus.   Musculoskeletal: Normal range of motion.  Neurological: She is alert and oriented to person, place, and time. Coordination normal.  Speech is goal-oriented. Moves limbs without ataxia.   Skin: Skin is warm and dry.  Psychiatric: She has a normal mood and affect. Her behavior is normal.    ED Course  Procedures (including critical care time) Labs Review Labs Reviewed  BASIC METABOLIC PANEL - Abnormal; Notable for the following:    Glucose, Bld 113 (*)    GFR calc non Af Amer 80 (*)    All other components within normal limits  URINALYSIS, ROUTINE W REFLEX MICROSCOPIC - Abnormal; Notable for the following:    Hgb urine dipstick TRACE (*)    All other components within normal limits  COMPREHENSIVE METABOLIC PANEL - Abnormal; Notable for the following:    Glucose, Bld 115 (*)    Albumin 3.4 (*)    GFR calc non Af Amer 78 (*)    All other components within normal limits  GLUCOSE, CAPILLARY - Abnormal; Notable for the following:    Glucose-Capillary 123 (*)    All other components within normal limits  GLUCOSE, CAPILLARY - Abnormal; Notable for the following:    Glucose-Capillary 112 (*)    All other components within normal limits  CBC - Abnormal; Notable for the following:    RBC 3.76 (*)    Hemoglobin 11.5 (*)    All other components within normal limits  CREATININE, SERUM - Abnormal; Notable for the following:    GFR calc non Af Amer 84 (*)    All other components within normal limits  CBC - Abnormal; Notable for the following:    RBC 3.56 (*)    Hemoglobin 10.9 (*)    HCT 33.9 (*)    All other components within normal limits  BASIC METABOLIC PANEL -  Abnormal; Notable for the following:    Glucose, Bld 137 (*)    GFR calc non Af Amer 85 (*)    All other components within normal limits  GLUCOSE, CAPILLARY - Abnormal; Notable for the following:    Glucose-Capillary 115 (*)    All other components within normal limits  GLUCOSE, CAPILLARY - Abnormal; Notable for the following:    Glucose-Capillary 130 (*)    All other components within normal limits  GLUCOSE, CAPILLARY - Abnormal; Notable for the following:    Glucose-Capillary 109 (*)    All other components within normal limits  CBC - Abnormal; Notable for the following:    RBC 3.31 (*)    Hemoglobin 10.1 (*)    HCT 31.4 (*)    All other components within normal limits  BASIC METABOLIC PANEL - Abnormal; Notable for the following:    Potassium 3.3 (*)    Glucose, Bld 115 (*)    GFR calc non Af Amer 87 (*)    All other components within normal limits  URINALYSIS, ROUTINE W REFLEX MICROSCOPIC - Abnormal; Notable for the following:    Hgb urine dipstick MODERATE (*)    All other components within normal limits  URINE MICROSCOPIC-ADD ON - Abnormal; Notable for the following:    Squamous Epithelial / LPF FEW (*)    All other components within normal  limits  URINE CULTURE  SURGICAL PCR SCREEN  CBC WITH DIFFERENTIAL  CBC  TSH  URINE MICROSCOPIC-ADD ON  SURGICAL PATHOLOGY   Imaging Review No results found.  EKG Interpretation    Date/Time:  Tuesday June 09 2013 10:33:02 EST Ventricular Rate:  73 PR Interval:  186 QRS Duration: 76 QT Interval:  428 QTC Calculation: 471 R Axis:   -16 Text Interpretation:  Normal sinus rhythm Inferior infarct , age undetermined Abnormal ECG ED PHYSICIAN INTERPRETATION AVAILABLE IN [facility] Confirmed by TEST, RECORD ([id number]) on 06/11/2013 2:14:48 PM            MDM   1. Small bowel obstruction   2. Umbilical hernia with obstruction   3. COPD (chronic obstructive pulmonary disease)   4. HTN (hypertension)   5. Unspecified  hypothyroidism   6. Chronic respiratory failure   7. Acute encephalopathy     7:36 PM Labs pending. Patient will have CT chest and abdomen to rule out dissection and AAA. Vitals stable and patient afebrile. Patient reports improvement of her pain with 50 mcg Fentanyl that she received en route to the ED.   Patient signed out to Earley Favor, NP.   Emilia Beck, [place] 06/11/13 1839

## 2013-06-08 NOTE — ED Provider Notes (Signed)
Ct scan shows small umbilical hernia and SBO at that point Spoke with Dr. Margaree Mackintosh who is glad to consult Spoke with family re admit  Will admit to medicine, bowel rest with surgical consult  Dr Margaree Mackintosh requests no NG tube  Dr. Darrick Penna Vascular surgery reviewed CT Angio small dissection  Not in need of surgical intervention at this time   Arman Filter, NP 06/08/13 2339  Arman Filter, NP 06/08/13 2349

## 2013-06-08 NOTE — ED Notes (Signed)
Pt c/o back and abd pain that started this morning. Pt took her oxycodone at 5pm and it helped some but then the pain came back. Pt states she discovered a knot on her belly button today. Pt rates pain 5/10. Pt states heat makes the pain feel better.

## 2013-06-08 NOTE — Consult Note (Signed)
Reason for Consult:Incarcerated periumbilical hernia Referring Physician: Pollina/ [name] is an 79 y.o. female.  HPI: 79 yo female with multiple medical issues, including oxygen dependent COPD presents with upper abdominal pain and back pain.  She has experienced nausea, but no vomiting.  Normal BM yesterday.  CT showed incarcerated periumbilical hernia.    Past Medical History  Diagnosis Date  . Osteoporosis, unspecified   . Allergic rhinitis, cause unspecified   . Other chronic pain   . Other abnormal blood chemistry   . Insomnia, unspecified   . Need for prophylactic hormone replacement therapy (postmenopausal)   . Degeneration of intervertebral disc, site unspecified   . Allergy, unspecified not elsewhere classified   . Myalgia and myositis, unspecified   . Unspecified essential hypertension   . Other and unspecified hyperlipidemia   . Esophageal reflux   . Diverticulosis of colon (without mention of hemorrhage)   . Unspecified hypothyroidism   . Stroke     mini strokes  . Asthma   . COPD (chronic obstructive pulmonary disease)   . Chronic kidney disease     frequency  . Anxiety   . Osteoarthritis of right knee 08/27/2011    R knee pain, s/p arthroscopy 2012 per [name]     Past Surgical History  Procedure Laterality Date  . Breast cystectomy    . Back surgery    . Shoulder surgery    . Knee arthroscopy      Right knee 2012  . Joint replacement      shoulder right  . Total knee arthroplasty  12/11/2011    Procedure: TOTAL KNEE ARTHROPLASTY;  Surgeon: Eulas Post, MD;  Location: [facility] OR;  Service: Orthopedics;  Laterality: Right;    Family History  Problem Relation Age of Onset  . Lung cancer Brother     x2  . Hypertension Father   . Heart disease Mother   . Asthma Sister     Social History:  reports that she quit smoking about 27 years ago. Her smoking use included Cigarettes. She has a 15 pack-year smoking history. She has never  used smokeless tobacco. She reports that she does not drink alcohol or use illicit drugs.  Allergies:  Allergies  Allergen Reactions  . Doxycycline     GI upset  . Sulfadiazine     REACTION: Fever, achey    Medications:  Prior to Admission medications   Medication Sig Start Date End Date Taking? Authorizing Provider  albuterol (PROVENTIL HFA;VENTOLIN HFA) 108 (90 BASE) MCG/ACT inhaler Inhale 1-2 puffs into the lungs every 6 (six) hours as needed for wheezing or shortness of breath.   Yes Historical Provider, MD  ALPRAZolam Prudy Feeler) 1 MG tablet Take 0.5 mg by mouth 2 (two) times daily as needed for anxiety.   Yes Historical Provider, MD  amLODipine (NORVASC) 2.5 MG tablet Take 2.5 mg by mouth daily.   Yes Historical Provider, MD  Calcium Carbonate (CALTRATE 600 PO) Take 1 tablet by mouth daily.     Yes Historical Provider, MD  cetirizine (ZYRTEC) 10 MG tablet Take 10 mg by mouth daily.   Yes Historical Provider, MD  Cholecalciferol (VITAMIN D3) 1000 UNITS CAPS Take 1 capsule by mouth daily.     Yes Historical Provider, MD  DULoxetine (CYMBALTA) 60 MG capsule Take 60 mg by mouth daily.   Yes Historical Provider, MD  fluticasone (FLONASE) 50 MCG/ACT nasal spray Place 1 spray into both nostrils daily.   Yes Historical Provider,  MD  fluticasone-salmeterol (ADVAIR HFA) 230-21 MCG/ACT inhaler Inhale 2 puffs into the lungs 2 (two) times daily.   Yes Historical Provider, MD  levothyroxine (SYNTHROID, LEVOTHROID) 112 MCG tablet Take 112 mcg by mouth daily before breakfast.   Yes Historical Provider, MD  Multiple Vitamin (MULTIVITAMIN) capsule Take 1 capsule by mouth daily.     Yes Historical Provider, MD  oxyCODONE-acetaminophen (PERCOCET) 7.5-325 MG per tablet Take 1 tablet by mouth every 6 (six) hours as needed for pain.   Yes Historical Provider, MD  ranitidine (ZANTAC) 300 MG tablet Take 300 mg by mouth 2 (two) times daily.   Yes Historical Provider, MD  tiotropium (SPIRIVA) 18 MCG inhalation  capsule Place 18 mcg into inhaler and inhale daily.   Yes Historical Provider, MD  VOLTAREN 1 % GEL Apply 4 g topically Twice daily.    Yes Historical Provider, MD  zolpidem (AMBIEN) 10 MG tablet Take 10 mg by mouth at bedtime as needed for sleep.   Yes Historical Provider, MD     Results for orders placed during the hospital encounter of 06/08/13 (from the past 48 hour(s))  CBC WITH DIFFERENTIAL     Status: None   Collection Time    06/08/13  7:19 PM      Result Value Range   WBC 8.7  4.0 - 10.5 K/uL   RBC 3.96  3.87 - 5.11 MIL/uL   Hemoglobin 12.3  12.0 - 15.0 g/dL   HCT 16.1  09.6 - 04.5 %   MCV 95.2  78.0 - 100.0 fL   MCH 31.1  26.0 - 34.0 pg   MCHC 32.6  30.0 - 36.0 g/dL   RDW 40.9  81.1 - 91.4 %   Platelets 244  150 - 400 K/uL   Neutrophils Relative % 74  43 - 77 %   Neutro Abs 6.4  1.7 - 7.7 K/uL   Lymphocytes Relative 19  12 - 46 %   Lymphs Abs 1.7  0.7 - 4.0 K/uL   Monocytes Relative 5  3 - 12 %   Monocytes Absolute 0.4  0.1 - 1.0 K/uL   Eosinophils Relative 2  0 - 5 %   Eosinophils Absolute 0.1  0.0 - 0.7 K/uL   Basophils Relative 0  0 - 1 %   Basophils Absolute 0.0  0.0 - 0.1 K/uL  BASIC METABOLIC PANEL     Status: Abnormal   Collection Time    06/08/13  7:19 PM      Result Value Range   Sodium 140  135 - 145 mEq/L   Potassium 3.8  3.5 - 5.1 mEq/L   Chloride 99  96 - 112 mEq/L   CO2 32  19 - 32 mEq/L   Glucose, Bld 113 (*) 70 - 99 mg/dL   BUN 14  6 - 23 mg/dL   Creatinine, Ser 7.82  0.50 - 1.10 mg/dL   Calcium 9.5  8.4 - 95.6 mg/dL   GFR calc non Af Amer 80 (*) >90 mL/min   GFR calc Af Amer >90  >90 mL/min   Comment: (NOTE)     The eGFR has been calculated using the CKD EPI equation.     This calculation has not been validated in all clinical situations.     eGFR's persistently <90 mL/min signify possible Chronic Kidney     Disease.    Ct Angio Chest W/cm &/or Wo Cm  06/08/2013   CLINICAL DATA:  Abdominal pain, rule out dissection  EXAM: CT ANGIOGRAPHY  CHEST, ABDOMEN AND PELVIS  TECHNIQUE: Multidetector CT imaging through the chest, abdomen and pelvis was performed using the standard protocol during bolus administration of intravenous contrast. Multiplanar reconstructed images including MIPs were obtained and reviewed to evaluate the vascular anatomy.  CONTRAST:  OMNIPAQUE IOHEXOL 350 MG/ML SOLN  COMPARISON:  03/15/2008 pelvic CT without contrast, 11/22/2004 chest CT  FINDINGS: CTA CHEST FINDINGS  Aneurysmal dilatation of the ascending aorta up to 3.6 cm, tapers along the arch to a normal caliber. The aorta is tortuous in caliber as arch branch vessels, with scattered atherosclerotic disease. No aortic dissection.  Cardiomegaly. Coronary artery and mitral valvular calcifications. No pleural or pericardial effusion. No intrathoracic lymphadenopathy.  Respiratory motion degrades detail lung parenchymal evaluation. Small amount of mucus/ debris layers within the trachea. Central airways are otherwise patent. Mild lower lobe peribronchial thickening. Linear lung base opacities, favor atelectasis or scarring. No pneumothorax. Biapical scarring.  Osteopenia and multilevel degenerative changes. Sequelae of prior posterior right rib fracture. No acute osseous finding. Right shoulder arthroplasty results in streak artifact. Streak artifact obscures the thyroid gland. There is a questionable right lobe nodule on image 17/ 290 of series 7.  Review of the MIP images confirms the above findings.  CTA ABDOMEN AND PELVIS FINDINGS  Scattered atherosclerotic disease of the aorta and branch vessels. Dilatation up to 3 cm at the level of the hiatus and the 3.3 cm at the level of the right renal artery. There is a short segment dissection at the level of the left renal artery origin as seen on images 157- 159/290. Multifocal ectasia of the infrarenal aorta up to 2.7 cm. Tortuous iliac arteries, normal caliber with scattered atherosclerotic disease.  Organ evaluation is limited  in the arterial phase. Within this limitation, low-attenuation of the liver suggests fatty infiltration.  No radiodense gallstones. Mild extrahepatic biliary ductal prominence, with CBD measuring up to 8 mm to the level of the ampulla. No obstructing lesion is visualized. Unremarkable spleen. Fatty atrophy of the pancreas. New unremarkable adrenal glands.  Mildly lobular renal contours. Too small to further characterize hypodensity left kidney posteriorly. Otherwise, symmetric renal enhancement. No hydroureteronephrosis.  Redundant sigmoid colon. Colonic diverticulosis. No overt colitis or diverticulitis. Normal appendix. Small hiatal hernia. Dilated small bowel loops with air-fluid levels, measuring up to 3.1 cm in diameter. Small bowel containing paraumbilical hernia is a transition point, with the small bowel distal to this point decompressed. Small amount of interloop and dependent fluid within the intraperitoneal cavity. No free intraperitoneal air. No bowel wall pneumatosis. Mildly prominent porta hepatis lymph nodes.  Enlarged/heterogeneously enhancing uterus. I cannot differentiate adnexa.  Multilevel degenerative change. Osteopenia. No acute osseous finding.  Review of the MIP images confirms the above findings.  IMPRESSION: Small bowel obstruction, transition point is a small bowel containing paraumbilical hernia. Recommend surgical consultation.  Multifocal ectatic and aneurysmal dilated abdominal aorta, measuring up to 3.2 cm. 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  Enlarged/heterogeneously enhancing uterus may reflect fibroids however given continued enlargement, recommend nonemergent ultrasound and GYN consultation.  Right thyroid lobe nodule is questioned (the gland is obscured by streak artifact from a right shoulder arthroplasty). Recommend nonemergent thyroid ultrasound follow-up.  These results were called by telephone at the time of interpretation on 06/08/2013 at  11:05 PM to Dr. Blinda Leatherwood, who verbally acknowledged these results.   Electronically Signed   By: Jearld Lesch M.D.   On: 06/08/2013 23:08   Ct  Cta Abd/pel W/cm &/or W/o Cm  06/08/2013   CLINICAL DATA:  Abdominal pain, rule out dissection  EXAM: CT ANGIOGRAPHY CHEST, ABDOMEN AND PELVIS  TECHNIQUE: Multidetector CT imaging through the chest, abdomen and pelvis was performed using the standard protocol during bolus administration of intravenous contrast. Multiplanar reconstructed images including MIPs were obtained and reviewed to evaluate the vascular anatomy.  CONTRAST:  OMNIPAQUE IOHEXOL 350 MG/ML SOLN  COMPARISON:  03/15/2008 pelvic CT without contrast, 11/22/2004 chest CT  FINDINGS: CTA CHEST FINDINGS  Aneurysmal dilatation of the ascending aorta up to 3.6 cm, tapers along the arch to a normal caliber. The aorta is tortuous in caliber as arch branch vessels, with scattered atherosclerotic disease. No aortic dissection.  Cardiomegaly. Coronary artery and mitral valvular calcifications. No pleural or pericardial effusion. No intrathoracic lymphadenopathy.  Respiratory motion degrades detail lung parenchymal evaluation. Small amount of mucus/ debris layers within the trachea. Central airways are otherwise patent. Mild lower lobe peribronchial thickening. Linear lung base opacities, favor atelectasis or scarring. No pneumothorax. Biapical scarring.  Osteopenia and multilevel degenerative changes. Sequelae of prior posterior right rib fracture. No acute osseous finding. Right shoulder arthroplasty results in streak artifact. Streak artifact obscures the thyroid gland. There is a questionable right lobe nodule on image 17/ 290 of series 7.  Review of the MIP images confirms the above findings.  CTA ABDOMEN AND PELVIS FINDINGS  Scattered atherosclerotic disease of the aorta and branch vessels. Dilatation up to 3 cm at the level of the hiatus and the 3.3 cm at the level of the right renal artery. There is a  short segment dissection at the level of the left renal artery origin as seen on images 157- 159/290. Multifocal ectasia of the infrarenal aorta up to 2.7 cm. Tortuous iliac arteries, normal caliber with scattered atherosclerotic disease.  Organ evaluation is limited in the arterial phase. Within this limitation, low-attenuation of the liver suggests fatty infiltration.  No radiodense gallstones. Mild extrahepatic biliary ductal prominence, with CBD measuring up to 8 mm to the level of the ampulla. No obstructing lesion is visualized. Unremarkable spleen. Fatty atrophy of the pancreas. New unremarkable adrenal glands.  Mildly lobular renal contours. Too small to further characterize hypodensity left kidney posteriorly. Otherwise, symmetric renal enhancement. No hydroureteronephrosis.  Redundant sigmoid colon. Colonic diverticulosis. No overt colitis or diverticulitis. Normal appendix. Small hiatal hernia. Dilated small bowel loops with air-fluid levels, measuring up to 3.1 cm in diameter. Small bowel containing paraumbilical hernia is a transition point, with the small bowel distal to this point decompressed. Small amount of interloop and dependent fluid within the intraperitoneal cavity. No free intraperitoneal air. No bowel wall pneumatosis. Mildly prominent porta hepatis lymph nodes.  Enlarged/heterogeneously enhancing uterus. I cannot differentiate adnexa.  Multilevel degenerative change. Osteopenia. No acute osseous finding.  Review of the MIP images confirms the above findings.  IMPRESSION: Small bowel obstruction, transition point is a small bowel containing paraumbilical hernia. Recommend surgical consultation.  Multifocal ectatic and aneurysmal dilated abdominal aorta, measuring up to 3.2 cm. 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  Enlarged/heterogeneously enhancing uterus may reflect fibroids however given continued enlargement, recommend nonemergent ultrasound and GYN  consultation.  Right thyroid lobe nodule is questioned (the gland is obscured by streak artifact from a right shoulder arthroplasty). Recommend nonemergent thyroid ultrasound follow-up.  These results were called by telephone at the time of interpretation on 06/08/2013 at 11:05 PM to Dr. Blinda Leatherwood, who verbally acknowledged these results.  Electronically Signed   By: Jearld Lesch M.D.   On: 06/08/2013 23:08    ROS Blood pressure 142/67, pulse 73, temperature 98.1 F (36.7 C), temperature source Oral, resp. rate 15, weight 190 lb (86.183 kg), SpO2 100.00%. Physical Exam Obese elderly female in NAD HEENT:  EOMI, sclera anicteric Neck:  No masses, no thyromegaly Lungs:  CTA bilaterally; normal respiratory effort CV:  Regular rate and rhythm; no murmurs Abd:  +bowel sounds, obese, soft except around umbilicus; periumbilical area is firm and tender - non-reducible; no peritoneal signs Ext:  Well-perfused; no edema Skin:  Warm, dry; no sign of jaundice  Assessment/Plan: Incarcerated periumbilical hernia Multiple significant medical comorbidities  [facility] to admit patient to manage medical issues Would hold off NG tube for now unless the patient begins to vomit. Will need surgical repair tomorrow morning. Hold blood thinners Keep NPO  Jarom Govan K. 06/08/2013, 11:33 PM

## 2013-06-09 ENCOUNTER — Encounter (HOSPITAL_COMMUNITY): Payer: Self-pay | Admitting: Internal Medicine

## 2013-06-09 ENCOUNTER — Encounter (HOSPITAL_COMMUNITY): Payer: Medicare Other | Admitting: Anesthesiology

## 2013-06-09 ENCOUNTER — Encounter (HOSPITAL_COMMUNITY)
Admission: EM | Disposition: A | Discharge status: Home Health | Payer: Self-pay | Source: Home / Self Care | Attending: Internal Medicine

## 2013-06-09 ENCOUNTER — Inpatient Hospital Stay (HOSPITAL_COMMUNITY): Payer: Medicare Other | Admitting: Anesthesiology

## 2013-06-09 DIAGNOSIS — K56609 Unspecified intestinal obstruction, unspecified as to partial versus complete obstruction: Secondary | ICD-10-CM

## 2013-06-09 DIAGNOSIS — J449 Chronic obstructive pulmonary disease, unspecified: Secondary | ICD-10-CM | POA: Diagnosis present

## 2013-06-09 DIAGNOSIS — E039 Hypothyroidism, unspecified: Secondary | ICD-10-CM

## 2013-06-09 DIAGNOSIS — I10 Essential (primary) hypertension: Secondary | ICD-10-CM

## 2013-06-09 HISTORY — DX: Essential (primary) hypertension: I10

## 2013-06-09 HISTORY — PX: UMBILICAL HERNIA REPAIR: SHX196

## 2013-06-09 LAB — CREATININE, SERUM
Creatinine, Ser: 0.6 mg/dL (ref 0.50–1.10)
GFR calc Af Amer: >90 mL/min (ref >90)
GFR calc non Af Amer: 84 mL/min — ABNORMAL LOW (ref >90)

## 2013-06-09 LAB — CBC
HCT: 37.8 % (ref 36.0–46.0)
HCT: 36.1 % (ref 36.0–46.0)
Hemoglobin: 12 g/dL (ref 12.0–15.0)
Hemoglobin: 11.5 g/dL — ABNORMAL LOW (ref 12.0–15.0)
MCH: 30.5 pg (ref 26.0–34.0)
MCH: 30.6 pg (ref 26.0–34.0)
MCHC: 31.7 g/dL (ref 30.0–36.0)
MCHC: 31.9 g/dL (ref 30.0–36.0)
MCV: 95.9 fL (ref 78.0–100.0)
MCV: 96 fL (ref 78.0–100.0)
Platelets: 244 10*3/uL (ref 150–400)
Platelets: 229 10*3/uL (ref 150–400)
RBC: 3.94 MIL/uL (ref 3.87–5.11)
RBC: 3.76 MIL/uL — ABNORMAL LOW (ref 3.87–5.11)
RDW: 14.9 % (ref 11.5–15.5)
RDW: 14.9 % (ref 11.5–15.5)
WBC: 7.9 10*3/uL (ref 4.0–10.5)
WBC: 7.1 10*3/uL (ref 4.0–10.5)

## 2013-06-09 LAB — GLUCOSE, CAPILLARY
Glucose-Capillary: 112 mg/dL — ABNORMAL HIGH (ref 70–99)
Glucose-Capillary: 115 mg/dL — ABNORMAL HIGH (ref 70–99)
Glucose-Capillary: 123 mg/dL — ABNORMAL HIGH (ref 70–99)

## 2013-06-09 LAB — URINALYSIS, ROUTINE W REFLEX MICROSCOPIC
Bilirubin Urine: NEGATIVE
Glucose, UA: NEGATIVE mg/dL
Ketones, ur: NEGATIVE mg/dL
Leukocytes, UA: NEGATIVE
Nitrite: NEGATIVE
Protein, ur: NEGATIVE mg/dL
Specific Gravity, Urine: 1.01 (ref 1.005–1.030)
Urobilinogen, UA: 0.2 mg/dL (ref 0.0–1.0)
pH: 5.5 (ref 5.0–8.0)

## 2013-06-09 LAB — COMPREHENSIVE METABOLIC PANEL
ALT: 8 U/L (ref 0–35)
AST: 24 U/L (ref 0–37)
Albumin: 3.4 g/dL — ABNORMAL LOW (ref 3.5–5.2)
Alkaline Phosphatase: 65 U/L (ref 39–117)
BUN: 14 mg/dL (ref 6–23)
CO2: 32 mEq/L (ref 19–32)
Calcium: 9.4 mg/dL (ref 8.4–10.5)
Chloride: 100 mEq/L (ref 96–112)
Creatinine, Ser: 0.74 mg/dL (ref 0.50–1.10)
GFR calc Af Amer: >90 mL/min (ref >90)
GFR calc non Af Amer: 78 mL/min — ABNORMAL LOW (ref >90)
Glucose, Bld: 115 mg/dL — ABNORMAL HIGH (ref 70–99)
Potassium: 3.5 mEq/L (ref 3.5–5.1)
Sodium: 141 mEq/L (ref 135–145)
Total Bilirubin: 0.6 mg/dL (ref 0.3–1.2)
Total Protein: 7 g/dL (ref 6.0–8.3)

## 2013-06-09 LAB — TSH: TSH: 1.257 u[IU]/mL (ref 0.350–4.500)

## 2013-06-09 LAB — URINE MICROSCOPIC-ADD ON

## 2013-06-09 LAB — SURGICAL PCR SCREEN
MRSA, PCR: NEGATIVE
Staphylococcus aureus: NEGATIVE

## 2013-06-09 SURGERY — REPAIR, HERNIA, UMBILICAL, ADULT
Anesthesia: General | Site: Abdomen

## 2013-06-09 MED ORDER — LEVOTHYROXINE SODIUM 100 MCG IV SOLR
56.0000 ug | Freq: Every day | INTRAVENOUS | Status: DC
  Administered 2013-06-09 – 2013-06-10 (×2): 56 ug via INTRAVENOUS
  Filled 2013-06-09 (×3): qty 5

## 2013-06-09 MED ORDER — MORPHINE SULFATE 4 MG/ML IJ SOLN
4.0000 mg | Freq: Once | INTRAMUSCULAR | Status: AC
  Administered 2013-06-09: 4 mg via INTRAVENOUS
  Filled 2013-06-09: qty 1

## 2013-06-09 MED ORDER — HYDRALAZINE HCL 20 MG/ML IJ SOLN: 10.0000 mg | INTRAMUSCULAR | Status: DC | PRN

## 2013-06-09 MED ORDER — SODIUM CHLORIDE 0.9 % IV SOLN
INTRAVENOUS | Status: AC
  Administered 2013-06-09: 02:00:00 via INTRAVENOUS

## 2013-06-09 MED ORDER — ROCURONIUM BROMIDE 100 MG/10ML IV SOLN
INTRAVENOUS | Status: DC | PRN
  Administered 2013-06-09: 50 mg via INTRAVENOUS

## 2013-06-09 MED ORDER — ONDANSETRON HCL 4 MG/2ML IJ SOLN
4.0000 mg | Freq: Once | INTRAMUSCULAR | Status: AC | PRN
  Administered 2013-06-09: 4 mg via INTRAVENOUS

## 2013-06-09 MED ORDER — WHITE PETROLATUM GEL
Status: AC
  Filled 2013-06-09: qty 5

## 2013-06-09 MED ORDER — TIOTROPIUM BROMIDE MONOHYDRATE 18 MCG IN CAPS
18.0000 ug | ORAL_CAPSULE | Freq: Every day | RESPIRATORY_TRACT | Status: DC
  Administered 2013-06-10 – 2013-06-15 (×6): 18 ug via RESPIRATORY_TRACT
  Filled 2013-06-09 (×2): qty 5

## 2013-06-09 MED ORDER — KCL IN DEXTROSE-NACL 20-5-0.45 MEQ/L-%-% IV SOLN
INTRAVENOUS | Status: DC
  Administered 2013-06-09 – 2013-06-13 (×6): via INTRAVENOUS
  Filled 2013-06-09 (×11): qty 1000

## 2013-06-09 MED ORDER — MORPHINE SULFATE 2 MG/ML IJ SOLN
1.0000 mg | INTRAMUSCULAR | Status: DC | PRN
  Administered 2013-06-09 – 2013-06-10 (×5): 1 mg via INTRAVENOUS
  Filled 2013-06-09 (×5): qty 1

## 2013-06-09 MED ORDER — MOMETASONE FURO-FORMOTEROL FUM 200-5 MCG/ACT IN AERO
2.0000 | INHALATION_SPRAY | Freq: Two times a day (BID) | RESPIRATORY_TRACT | Status: DC
  Filled 2013-06-09: qty 8.8

## 2013-06-09 MED ORDER — ZOLPIDEM TARTRATE 5 MG PO TABS
5.0000 mg | ORAL_TABLET | Freq: Once | ORAL | Status: AC
  Administered 2013-06-09: 5 mg via ORAL
  Filled 2013-06-09: qty 1

## 2013-06-09 MED ORDER — ONDANSETRON HCL 4 MG/2ML IJ SOLN
4.0000 mg | Freq: Four times a day (QID) | INTRAMUSCULAR | Status: DC | PRN
  Administered 2013-06-09 (×2): 4 mg via INTRAVENOUS
  Filled 2013-06-09 (×2): qty 2

## 2013-06-09 MED ORDER — HYDROMORPHONE HCL PF 1 MG/ML IJ SOLN
0.2500 mg | INTRAMUSCULAR | Status: DC | PRN
  Administered 2013-06-09 (×3): 0.5 mg via INTRAVENOUS

## 2013-06-09 MED ORDER — CHLORHEXIDINE GLUCONATE 4 % EX LIQD: 1.0000 "application " | Freq: Once | CUTANEOUS | Status: DC

## 2013-06-09 MED ORDER — ACETAMINOPHEN 650 MG RE SUPP: 650.0000 mg | Freq: Four times a day (QID) | RECTAL | Status: DC | PRN

## 2013-06-09 MED ORDER — BIOTENE DRY MOUTH MT LIQD
15.0000 mL | Freq: Two times a day (BID) | OROMUCOSAL | Status: DC
  Administered 2013-06-09 – 2013-06-13 (×7): 15 mL via OROMUCOSAL

## 2013-06-09 MED ORDER — ACETAMINOPHEN 325 MG PO TABS
650.0000 mg | ORAL_TABLET | Freq: Four times a day (QID) | ORAL | Status: DC | PRN
  Administered 2013-06-09: 650 mg via ORAL
  Filled 2013-06-09: qty 2

## 2013-06-09 MED ORDER — SUCCINYLCHOLINE CHLORIDE 20 MG/ML IJ SOLN
INTRAMUSCULAR | Status: DC | PRN
  Administered 2013-06-09: 140 mg via INTRAVENOUS

## 2013-06-09 MED ORDER — PANTOPRAZOLE SODIUM 40 MG IV SOLR
40.0000 mg | INTRAVENOUS | Status: DC
  Administered 2013-06-09 – 2013-06-11 (×3): 40 mg via INTRAVENOUS
  Filled 2013-06-09 (×5): qty 40

## 2013-06-09 MED ORDER — ONDANSETRON HCL 4 MG/2ML IJ SOLN
4.0000 mg | Freq: Once | INTRAMUSCULAR | Status: AC
  Administered 2013-06-09: 4 mg via INTRAVENOUS
  Filled 2013-06-09: qty 2

## 2013-06-09 MED ORDER — FLUTICASONE PROPIONATE 50 MCG/ACT NA SUSP
1.0000 | Freq: Every day | NASAL | Status: DC
  Administered 2013-06-09 – 2013-06-15 (×6): 1 via NASAL
  Filled 2013-06-09: qty 16

## 2013-06-09 MED ORDER — LACTATED RINGERS IV SOLN
INTRAVENOUS | Status: DC | PRN
  Administered 2013-06-09 (×2): via INTRAVENOUS

## 2013-06-09 MED ORDER — CEFAZOLIN SODIUM-DEXTROSE 2-3 GM-% IV SOLR
INTRAVENOUS | Status: AC
  Filled 2013-06-09: qty 50

## 2013-06-09 MED ORDER — ONDANSETRON HCL 4 MG PO TABS
4.0000 mg | ORAL_TABLET | Freq: Four times a day (QID) | ORAL | Status: DC | PRN
  Filled 2013-06-09: qty 1

## 2013-06-09 MED ORDER — MORPHINE SULFATE 2 MG/ML IJ SOLN
1.0000 mg | INTRAMUSCULAR | Status: DC | PRN
  Administered 2013-06-09 (×2): 1 mg via INTRAVENOUS
  Filled 2013-06-09 (×2): qty 1

## 2013-06-09 MED ORDER — HEPARIN SODIUM (PORCINE) 5000 UNIT/ML IJ SOLN
5000.0000 [IU] | Freq: Three times a day (TID) | INTRAMUSCULAR | Status: DC
  Administered 2013-06-10 – 2013-06-15 (×14): 5000 [IU] via SUBCUTANEOUS
  Filled 2013-06-09 (×24): qty 1

## 2013-06-09 MED ORDER — CEFAZOLIN SODIUM-DEXTROSE 2-3 GM-% IV SOLR: 2.0000 g | INTRAVENOUS | Status: DC

## 2013-06-09 MED ORDER — NEOSTIGMINE METHYLSULFATE 1 MG/ML IJ SOLN
INTRAMUSCULAR | Status: DC | PRN
  Administered 2013-06-09 (×2): 2 mg via INTRAVENOUS

## 2013-06-09 MED ORDER — EPHEDRINE SULFATE 50 MG/ML IJ SOLN
INTRAMUSCULAR | Status: DC | PRN
  Administered 2013-06-09: 10 mg via INTRAVENOUS

## 2013-06-09 MED ORDER — ALBUTEROL SULFATE HFA 108 (90 BASE) MCG/ACT IN AERS
1.0000 | INHALATION_SPRAY | Freq: Four times a day (QID) | RESPIRATORY_TRACT | Status: DC | PRN
  Administered 2013-06-10: 2 via RESPIRATORY_TRACT
  Filled 2013-06-09: qty 6.7

## 2013-06-09 MED ORDER — OXYCODONE HCL 5 MG/5ML PO SOLN: 5.0000 mg | Freq: Once | ORAL | Status: DC | PRN

## 2013-06-09 MED ORDER — METOPROLOL TARTRATE 1 MG/ML IV SOLN
INTRAVENOUS | Status: DC | PRN
  Administered 2013-06-09: 1 mg via INTRAVENOUS

## 2013-06-09 MED ORDER — MEPERIDINE HCL 25 MG/ML IJ SOLN: 6.2500 mg | INTRAMUSCULAR | Status: DC | PRN

## 2013-06-09 MED ORDER — GLYCOPYRROLATE 0.2 MG/ML IJ SOLN
INTRAMUSCULAR | Status: DC | PRN
  Administered 2013-06-09 (×2): 0.3 mg via INTRAVENOUS

## 2013-06-09 MED ORDER — CHLORHEXIDINE GLUCONATE 0.12 % MT SOLN
15.0000 mL | Freq: Two times a day (BID) | OROMUCOSAL | Status: DC
  Administered 2013-06-09 – 2013-06-13 (×9): 15 mL via OROMUCOSAL
  Filled 2013-06-09 (×8): qty 15

## 2013-06-09 MED ORDER — HYDROMORPHONE HCL PF 1 MG/ML IJ SOLN
INTRAMUSCULAR | Status: AC
  Filled 2013-06-09: qty 1

## 2013-06-09 MED ORDER — OXYCODONE HCL 5 MG PO TABS: 5.0000 mg | ORAL_TABLET | Freq: Once | ORAL | Status: DC | PRN

## 2013-06-09 MED ORDER — PROPOFOL 10 MG/ML IV BOLUS
INTRAVENOUS | Status: DC | PRN
  Administered 2013-06-09: 110 mg via INTRAVENOUS

## 2013-06-09 MED ORDER — ONDANSETRON HCL 4 MG/2ML IJ SOLN
INTRAMUSCULAR | Status: AC
  Filled 2013-06-09: qty 2

## 2013-06-09 MED ORDER — ONDANSETRON HCL 4 MG/2ML IJ SOLN
INTRAMUSCULAR | Status: DC | PRN
  Administered 2013-06-09: 4 mg via INTRAVENOUS

## 2013-06-09 MED ORDER — FENTANYL CITRATE 0.05 MG/ML IJ SOLN
INTRAMUSCULAR | Status: DC | PRN
  Administered 2013-06-09: 50 ug via INTRAVENOUS
  Administered 2013-06-09: 150 ug via INTRAVENOUS
  Administered 2013-06-09: 50 ug via INTRAVENOUS

## 2013-06-09 MED ORDER — LACTATED RINGERS IV SOLN: INTRAVENOUS | Status: DC

## 2013-06-09 MED ORDER — HYDROMORPHONE HCL PF 1 MG/ML IJ SOLN
INTRAMUSCULAR | Status: AC
  Administered 2013-06-09: 0.5 mg via INTRAVENOUS
  Filled 2013-06-09: qty 1

## 2013-06-09 MED ORDER — 0.9 % SODIUM CHLORIDE (POUR BTL) OPTIME
TOPICAL | Status: DC | PRN
  Administered 2013-06-09 (×2): 1000 mL

## 2013-06-09 MED ORDER — LIDOCAINE HCL (CARDIAC) 20 MG/ML IV SOLN
INTRAVENOUS | Status: DC | PRN
  Administered 2013-06-09: 100 mg via INTRAVENOUS

## 2013-06-09 SURGICAL SUPPLY — 47 items
CANISTER SUCTION 2500CC (MISCELLANEOUS) ×2 IMPLANT
CHLORAPREP W/TINT 26ML (MISCELLANEOUS) ×2 IMPLANT
COVER SURGICAL LIGHT HANDLE (MISCELLANEOUS) ×2 IMPLANT
DRAPE LAPAROSCOPIC ABDOMINAL (DRAPES) ×2 IMPLANT
DRAPE PROXIMA HALF (DRAPES) IMPLANT
DRAPE UTILITY 15X26 W/TAPE STR (DRAPE) ×4 IMPLANT
DRAPE WARM FLUID 44X44 (DRAPE) ×2 IMPLANT
DRSG OPSITE POSTOP 4X10 (GAUZE/BANDAGES/DRESSINGS) IMPLANT
DRSG OPSITE POSTOP 4X8 (GAUZE/BANDAGES/DRESSINGS) IMPLANT
ELECT BLADE 6.5 EXT (BLADE) IMPLANT
ELECT CAUTERY BLADE 6.4 (BLADE) ×2 IMPLANT
ELECT REM PT RETURN 9FT ADLT (ELECTROSURGICAL) ×2
ELECTRODE REM PT RTRN 9FT ADLT (ELECTROSURGICAL) ×1 IMPLANT
GLOVE BIO SURGEON STRL SZ7.5 (GLOVE) ×4 IMPLANT
GLOVE BIOGEL PI IND STRL 6.5 (GLOVE) ×1 IMPLANT
GLOVE BIOGEL PI IND STRL 7.5 (GLOVE) ×2 IMPLANT
GLOVE BIOGEL PI INDICATOR 6.5 (GLOVE) ×1
GLOVE BIOGEL PI INDICATOR 7.5 (GLOVE) ×2
GLOVE EUDERMIC 7 POWDERFREE (GLOVE) ×4 IMPLANT
GOWN STRL NON-REIN LRG LVL3 (GOWN DISPOSABLE) ×6 IMPLANT
GOWN STRL REIN XL XLG (GOWN DISPOSABLE) ×4 IMPLANT
KIT BASIN OR (CUSTOM PROCEDURE TRAY) ×2 IMPLANT
KIT ROOM TURNOVER OR (KITS) ×2 IMPLANT
LIGASURE IMPACT 36 18CM CVD LR (INSTRUMENTS) IMPLANT
MESH VENTRALEX ST 8CM LRG (Mesh General) ×2 IMPLANT
NS IRRIG 1000ML POUR BTL (IV SOLUTION) ×4 IMPLANT
PACK GENERAL/GYN (CUSTOM PROCEDURE TRAY) ×2 IMPLANT
PAD ARMBOARD 7.5X6 YLW CONV (MISCELLANEOUS) ×2 IMPLANT
PENCIL BUTTON HOLSTER BLD 10FT (ELECTRODE) IMPLANT
SPECIMEN JAR LARGE (MISCELLANEOUS) IMPLANT
SPECIMEN JAR SMALL (MISCELLANEOUS) ×2 IMPLANT
SPONGE GAUZE 4X4 12PLY (GAUZE/BANDAGES/DRESSINGS) ×2 IMPLANT
SPONGE LAP 18X18 X RAY DECT (DISPOSABLE) IMPLANT
STAPLER VISISTAT 35W (STAPLE) ×2 IMPLANT
SUCTION POOLE TIP (SUCTIONS) ×2 IMPLANT
SUT PDS AB 1 TP1 96 (SUTURE) IMPLANT
SUT PROLENE 0 CT 1 CR/8 (SUTURE) ×2 IMPLANT
SUT SILK 2 0 SH CR/8 (SUTURE) ×2 IMPLANT
SUT SILK 2 0 TIES 10X30 (SUTURE) ×2 IMPLANT
SUT SILK 3 0 SH CR/8 (SUTURE) ×2 IMPLANT
SUT SILK 3 0 TIES 10X30 (SUTURE) ×2 IMPLANT
SUT VIC AB 3-0 SH 18 (SUTURE) IMPLANT
TAPE CLOTH SURG 4X10 WHT LF (GAUZE/BANDAGES/DRESSINGS) ×2 IMPLANT
TOWEL OR 17X26 10 PK STRL BLUE (TOWEL DISPOSABLE) ×2 IMPLANT
TOWEL OR NON WOVEN STRL DISP B (DISPOSABLE) ×4 IMPLANT
TRAY FOLEY CATH 16FRSI W/METER (SET/KITS/TRAYS/PACK) ×2 IMPLANT
YANKAUER SUCT BULB TIP NO VENT (SUCTIONS) IMPLANT

## 2013-06-09 NOTE — Anesthesia Procedure Notes (Addendum)
Date/Time: 06/09/2013 11:40 AM Performed by: Whitman Hero Pre-anesthesia Checklist: Patient identified, Emergency Drugs available, Suction available and Patient being monitored Patient Re-evaluated:Patient Re-evaluated prior to inductionOxygen Delivery Method: Circle system utilized Preoxygenation: Pre-oxygenation with 100% oxygen Intubation Type: IV induction Laryngoscope Size: Mac and 3 Grade View: Grade I Tube type: Oral Tube size: 7.5 mm Number of attempts: 1 Airway Equipment and Method: Stylet Placement Confirmation: ETT inserted through vocal cords under direct vision,  positive ETCO2 and breath sounds checked- equal and bilateral Secured at: 21 cm Tube secured with: Tape Dental Injury: Teeth and Oropharynx as per pre-operative assessment

## 2013-06-09 NOTE — Anesthesia Preprocedure Evaluation (Signed)
Anesthesia Evaluation  Patient identified by MRN, date of birth, ID band Patient awake    Reviewed: Allergy & Precautions, H&P , NPO status , Patient's Chart, lab work & pertinent test results  Airway Mallampati: I TM Distance: >3 FB Neck ROM: Full    Dental   Pulmonary COPDformer smoker,          Cardiovascular hypertension, Pt. on medications     Neuro/Psych Anxiety CVA    GI/Hepatic GERD-  Medicated and Controlled,  Endo/Other  Hypothyroidism   Renal/GU      Musculoskeletal   Abdominal   Peds  Hematology   Anesthesia Other Findings   Reproductive/Obstetrics                           Anesthesia Physical Anesthesia Plan  ASA: III  Anesthesia Plan: General   Post-op Pain Management:    Induction: Intravenous  Airway Management Planned: LMA  Additional Equipment:   Intra-op Plan:   Post-operative Plan: Extubation in OR  Informed Consent: I have reviewed the patients History and Physical, chart, labs and discussed the procedure including the risks, benefits and alternatives for the proposed anesthesia with the patient or authorized representative who has indicated his/her understanding and acceptance.     Plan Discussed with: CRNA and Surgeon  Anesthesia Plan Comments:         Anesthesia Quick Evaluation

## 2013-06-09 NOTE — Progress Notes (Signed)
SBO (small bowel obstruction)  Assessment: SBO secondary to incarcerated umbilical hernia  Plan: Will take to OR today for repair of hernia. Discussed with patient. She is aware sehe may be vent dependent for a while post op. She wishes to proceed   Subjective: Belching a lot, still with pain at umbilcus  Objective: Vital signs in last 24 hours: Temp:  [98.1 F (36.7 C)-98.2 F (36.8 C)] 98.2 F (36.8 C) (12/02 0531) Pulse Rate:  [53-86] 81 (12/02 0531) Resp:  [12-19] 16 (12/02 0531) BP: (125-171)/(67-93) 125/93 mmHg (12/02 0531) SpO2:  [96 %-100 %] 97 % (12/02 0531) Weight:  [190 lb (86.183 kg)-192 lb 4.8 oz (87.227 kg)] 192 lb 4.8 oz (87.227 kg) (12/02 0110)    Intake/Output from previous day: 12/01 0701 - 12/02 0700 In: 416.7 [I.V.:416.7] Out: 150 [Urine:150]  General appearance: alert, cooperative, appears stated age and no distress Head: Normocephalic, without obvious abnormality, atraumatic GI: Soft, mildly distended, tender mass just to the left of the umbilicus c/w hernia.   Lab Results:  Results for orders placed during the hospital encounter of 06/08/13 (from the past 24 hour(s))  CBC WITH DIFFERENTIAL     Status: None   Collection Time    06/08/13  7:19 PM      Result Value Range   WBC 8.7  4.0 - 10.5 K/uL   RBC 3.96  3.87 - 5.11 MIL/uL   Hemoglobin 12.3  12.0 - 15.0 g/dL   HCT 16.1  09.6 - 04.5 %   MCV 95.2  78.0 - 100.0 fL   MCH 31.1  26.0 - 34.0 pg   MCHC 32.6  30.0 - 36.0 g/dL   RDW 40.9  81.1 - 91.4 %   Platelets 244  150 - 400 K/uL   Neutrophils Relative % 74  43 - 77 %   Neutro Abs 6.4  1.7 - 7.7 K/uL   Lymphocytes Relative 19  12 - 46 %   Lymphs Abs 1.7  0.7 - 4.0 K/uL   Monocytes Relative 5  3 - 12 %   Monocytes Absolute 0.4  0.1 - 1.0 K/uL   Eosinophils Relative 2  0 - 5 %   Eosinophils Absolute 0.1  0.0 - 0.7 K/uL   Basophils Relative 0  0 - 1 %   Basophils Absolute 0.0  0.0 - 0.1 K/uL  BASIC METABOLIC PANEL     Status: Abnormal   Collection Time    06/08/13  7:19 PM      Result Value Range   Sodium 140  135 - 145 mEq/L   Potassium 3.8  3.5 - 5.1 mEq/L   Chloride 99  96 - 112 mEq/L   CO2 32  19 - 32 mEq/L   Glucose, Bld 113 (*) 70 - 99 mg/dL   BUN 14  6 - 23 mg/dL   Creatinine, Ser 7.82  0.50 - 1.10 mg/dL   Calcium 9.5  8.4 - 95.6 mg/dL   GFR calc non Af Amer 80 (*) >90 mL/min   GFR calc Af Amer >90  >90 mL/min  SURGICAL PCR SCREEN     Status: None   Collection Time    06/09/13  2:47 AM      Result Value Range   MRSA, PCR NEGATIVE  NEGATIVE   Staphylococcus aureus NEGATIVE  NEGATIVE  GLUCOSE, CAPILLARY     Status: Abnormal   Collection Time    06/09/13  5:28 AM      Result Value Range  Glucose-Capillary 123 (*) 70 - 99 mg/dL  URINALYSIS, ROUTINE W REFLEX MICROSCOPIC     Status: Abnormal   Collection Time    06/09/13  5:33 AM      Result Value Range   Color, Urine YELLOW  YELLOW   APPearance CLEAR  CLEAR   Specific Gravity, Urine 1.010  1.005 - 1.030   pH 5.5  5.0 - 8.0   Glucose, UA NEGATIVE  NEGATIVE mg/dL   Hgb urine dipstick TRACE (*) NEGATIVE   Bilirubin Urine NEGATIVE  NEGATIVE   Ketones, ur NEGATIVE  NEGATIVE mg/dL   Protein, ur NEGATIVE  NEGATIVE mg/dL   Urobilinogen, UA 0.2  0.0 - 1.0 mg/dL   Nitrite NEGATIVE  NEGATIVE   Leukocytes, UA NEGATIVE  NEGATIVE  URINE MICROSCOPIC-ADD ON     Status: None   Collection Time    06/09/13  5:33 AM      Result Value Range   Squamous Epithelial / LPF RARE  RARE   RBC / HPF 0-2  <3 RBC/hpf   Bacteria, UA RARE  RARE  COMPREHENSIVE METABOLIC PANEL     Status: Abnormal   Collection Time    06/09/13  5:55 AM      Result Value Range   Sodium 141  135 - 145 mEq/L   Potassium 3.5  3.5 - 5.1 mEq/L   Chloride 100  96 - 112 mEq/L   CO2 32  19 - 32 mEq/L   Glucose, Bld 115 (*) 70 - 99 mg/dL   BUN 14  6 - 23 mg/dL   Creatinine, Ser 1.61  0.50 - 1.10 mg/dL   Calcium 9.4  8.4 - 09.6 mg/dL   Total Protein 7.0  6.0 - 8.3 g/dL   Albumin 3.4 (*) 3.5 - 5.2  g/dL   AST 24  0 - 37 U/L   ALT 8  0 - 35 U/L   Alkaline Phosphatase 65  39 - 117 U/L   Total Bilirubin 0.6  0.3 - 1.2 mg/dL   GFR calc non Af Amer 78 (*) >90 mL/min   GFR calc Af Amer >90  >90 mL/min  CBC     Status: None   Collection Time    06/09/13  5:55 AM      Result Value Range   WBC 7.9  4.0 - 10.5 K/uL   RBC 3.94  3.87 - 5.11 MIL/uL   Hemoglobin 12.0  12.0 - 15.0 g/dL   HCT 04.5  40.9 - 81.1 %   MCV 95.9  78.0 - 100.0 fL   MCH 30.5  26.0 - 34.0 pg   MCHC 31.7  30.0 - 36.0 g/dL   RDW 91.4  78.2 - 95.6 %   Platelets 244  150 - 400 K/uL     Studies/Results Radiology     MEDS, Scheduled . antiseptic oral rinse  15 mL Mouth Rinse q12n4p  . chlorhexidine  15 mL Mouth Rinse BID  . fluticasone  1 spray Each Nare Daily  . levothyroxine  56 mcg Intravenous QAC breakfast  . mometasone-formoterol  2 puff Inhalation BID  . pantoprazole (PROTONIX) IV  40 mg Intravenous Q24H  . tiotropium  18 mcg Inhalation Daily  . white petrolatum           LOS: 1 day    Currie Paris, MD, [facility], [place] [phone]   06/09/2013 7:52 AM

## 2013-06-09 NOTE — ED Notes (Signed)
Pt went to bathroom to obtain u/a.  Pt missed cup will try again later.  No problem ambulating to bathroom.

## 2013-06-09 NOTE — H&P (Signed)
[facility] History and Physical  [name] ZOX:[id number] DOB: 1930/12/09 DOA: 06/08/2013  Referring physician: ER physician. PCP: Crawford Givens, MD   Chief Complaint: Abdominal pain.  HPI: [name] is a 79 y.o. female with history of hypertension, COPD, hypothyroidism presented to the ER because of sudden onset of abdominal pain radiating to the back since last evening. CT abdomen and pelvis was done in the ER which shows features consistent with small bowel obstruction with transition point at small bowel containing paraumblical hernia. Co-surgeon Dr. Gwinda Passe was consulted and at this time patient has been admitted for possible surgery. Patient otherwise denies any chest pain shortness of breath. Has had one episode of nausea vomiting and her last bowel movement was yesterday morning. Presently patient is not in acute distress.   Review of Systems: As presented in the history of presenting illness, rest negative.  Past Medical History  Diagnosis Date  . Osteoporosis, unspecified   . Allergic rhinitis, cause unspecified   . Other chronic pain   . Other abnormal blood chemistry   . Insomnia, unspecified   . Need for prophylactic hormone replacement therapy (postmenopausal)   . Degeneration of intervertebral disc, site unspecified   . Allergy, unspecified not elsewhere classified   . Myalgia and myositis, unspecified   . Unspecified essential hypertension   . Other and unspecified hyperlipidemia   . Esophageal reflux   . Diverticulosis of colon (without mention of hemorrhage)   . Unspecified hypothyroidism   . Stroke     mini strokes  . Asthma   . COPD (chronic obstructive pulmonary disease)   . Chronic kidney disease     frequency  . Anxiety   . Osteoarthritis of right knee 08/27/2011    R knee pain, s/p arthroscopy 2012 per [name]    Past Surgical History  Procedure Laterality Date  . Breast cystectomy    . Back surgery    . Shoulder surgery    .  Knee arthroscopy      Right knee 2012  . Joint replacement      shoulder right  . Total knee arthroplasty  12/11/2011    Procedure: TOTAL KNEE ARTHROPLASTY;  Surgeon: Eulas Post, MD;  Location: [facility] OR;  Service: Orthopedics;  Laterality: Right;   Social History:  reports that she quit smoking about 27 years ago. Her smoking use included Cigarettes. She has a 15 pack-year smoking history. She has never used smokeless tobacco. She reports that she does not drink alcohol or use illicit drugs. Where does patient live home. Can patient participate in ADLs? Yes.  Allergies  Allergen Reactions  . Doxycycline     GI upset  . Sulfadiazine     REACTION: Fever, achey    Family History:  Family History  Problem Relation Age of Onset  . Lung cancer Brother     x2  . Hypertension Father   . Heart disease Mother   . Asthma Sister       Prior to Admission medications   Medication Sig Start Date End Date Taking? Authorizing Provider  albuterol (PROVENTIL HFA;VENTOLIN HFA) 108 (90 BASE) MCG/ACT inhaler Inhale 1-2 puffs into the lungs every 6 (six) hours as needed for wheezing or shortness of breath.   Yes Historical Provider, MD  ALPRAZolam Prudy Feeler) 1 MG tablet Take 0.5 mg by mouth 2 (two) times daily as needed for anxiety.   Yes Historical Provider, MD  amLODipine (NORVASC) 2.5 MG tablet Take 2.5 mg by mouth daily.  Yes Historical Provider, MD  Calcium Carbonate (CALTRATE 600 PO) Take 1 tablet by mouth daily.     Yes Historical Provider, MD  cetirizine (ZYRTEC) 10 MG tablet Take 10 mg by mouth daily.   Yes Historical Provider, MD  Cholecalciferol (VITAMIN D3) 1000 UNITS CAPS Take 1 capsule by mouth daily.     Yes Historical Provider, MD  DULoxetine (CYMBALTA) 60 MG capsule Take 60 mg by mouth daily.   Yes Historical Provider, MD  fluticasone (FLONASE) 50 MCG/ACT nasal spray Place 1 spray into both nostrils daily.   Yes Historical Provider, MD  fluticasone-salmeterol (ADVAIR HFA) 230-21  MCG/ACT inhaler Inhale 2 puffs into the lungs 2 (two) times daily.   Yes Historical Provider, MD  levothyroxine (SYNTHROID, LEVOTHROID) 112 MCG tablet Take 112 mcg by mouth daily before breakfast.   Yes Historical Provider, MD  Multiple Vitamin (MULTIVITAMIN) capsule Take 1 capsule by mouth daily.     Yes Historical Provider, MD  oxyCODONE-acetaminophen (PERCOCET) 7.5-325 MG per tablet Take 1 tablet by mouth every 6 (six) hours as needed for pain.   Yes Historical Provider, MD  ranitidine (ZANTAC) 300 MG tablet Take 300 mg by mouth 2 (two) times daily.   Yes Historical Provider, MD  tiotropium (SPIRIVA) 18 MCG inhalation capsule Place 18 mcg into inhaler and inhale daily.   Yes Historical Provider, MD  VOLTAREN 1 % GEL Apply 4 g topically Twice daily.    Yes Historical Provider, MD  zolpidem (AMBIEN) 10 MG tablet Take 10 mg by mouth at bedtime as needed for sleep.   Yes Historical Provider, MD    Physical Exam: Filed Vitals:   06/08/13 2230 06/08/13 2245 06/08/13 2300 06/09/13 0110  BP:   142/67 147/75  Pulse: 77 76 73 86  Temp:    98.1 F (36.7 C)  TempSrc:    Oral  Resp: 17 15 15 15   Height:    5\' 1"  (1.549 m)  Weight:    87.227 kg (192 lb 4.8 oz)  SpO2: 100% 100% 100% 96%     General:  Well-developed and nourished.  Eyes: Anicteric no pallor.  ENT: No discharge from the ears eyes nose mouth.  Neck: No mass felt.  Cardiovascular: S1-S2 heard.  Respiratory: No rhonchi or crepitations.  Abdomen: Soft nontender. Small hernia around the umbilicus.  Skin: No rash.  Musculoskeletal: No edema.  Psychiatric: Appears normal.  Neurologic: Alert awake oriented to time place and person. Moves all extremities.  Labs on Admission:  Basic Metabolic Panel:  Recent Labs Lab 06/08/13 1919  NA 140  K 3.8  CL 99  CO2 32  GLUCOSE 113*  BUN 14  CREATININE 0.68  CALCIUM 9.5   Liver Function Tests: No results found for this basename: AST, ALT, ALKPHOS, BILITOT, PROT, ALBUMIN,   in the last 168 hours No results found for this basename: LIPASE, AMYLASE,  in the last 168 hours No results found for this basename: AMMONIA,  in the last 168 hours CBC:  Recent Labs Lab 06/08/13 1919  WBC 8.7  NEUTROABS 6.4  HGB 12.3  HCT 37.7  MCV 95.2  PLT 244   Cardiac Enzymes: No results found for this basename: CKTOTAL, CKMB, CKMBINDEX, TROPONINI,  in the last 168 hours  BNP (last 3 results) No results found for this basename: PROBNP,  in the last 8760 hours CBG: No results found for this basename: GLUCAP,  in the last 168 hours  Radiological Exams on Admission: Ct Angio Chest W/cm &/or Wo  Cm  06/08/2013   CLINICAL DATA:  Abdominal pain, rule out dissection  EXAM: CT ANGIOGRAPHY CHEST, ABDOMEN AND PELVIS  TECHNIQUE: Multidetector CT imaging through the chest, abdomen and pelvis was performed using the standard protocol during bolus administration of intravenous contrast. Multiplanar reconstructed images including MIPs were obtained and reviewed to evaluate the vascular anatomy.  CONTRAST:  OMNIPAQUE IOHEXOL 350 MG/ML SOLN  COMPARISON:  03/15/2008 pelvic CT without contrast, 11/22/2004 chest CT  FINDINGS: CTA CHEST FINDINGS  Aneurysmal dilatation of the ascending aorta up to 3.6 cm, tapers along the arch to a normal caliber. The aorta is tortuous in caliber as arch branch vessels, with scattered atherosclerotic disease. No aortic dissection.  Cardiomegaly. Coronary artery and mitral valvular calcifications. No pleural or pericardial effusion. No intrathoracic lymphadenopathy.  Respiratory motion degrades detail lung parenchymal evaluation. Small amount of mucus/ debris layers within the trachea. Central airways are otherwise patent. Mild lower lobe peribronchial thickening. Linear lung base opacities, favor atelectasis or scarring. No pneumothorax. Biapical scarring.  Osteopenia and multilevel degenerative changes. Sequelae of prior posterior right rib fracture. No acute osseous  finding. Right shoulder arthroplasty results in streak artifact. Streak artifact obscures the thyroid gland. There is a questionable right lobe nodule on image 17/ 290 of series 7.  Review of the MIP images confirms the above findings.  CTA ABDOMEN AND PELVIS FINDINGS  Scattered atherosclerotic disease of the aorta and branch vessels. Dilatation up to 3 cm at the level of the hiatus and the 3.3 cm at the level of the right renal artery. There is a short segment dissection at the level of the left renal artery origin as seen on images 157- 159/290. Multifocal ectasia of the infrarenal aorta up to 2.7 cm. Tortuous iliac arteries, normal caliber with scattered atherosclerotic disease.  Organ evaluation is limited in the arterial phase. Within this limitation, low-attenuation of the liver suggests fatty infiltration.  No radiodense gallstones. Mild extrahepatic biliary ductal prominence, with CBD measuring up to 8 mm to the level of the ampulla. No obstructing lesion is visualized. Unremarkable spleen. Fatty atrophy of the pancreas. New unremarkable adrenal glands.  Mildly lobular renal contours. Too small to further characterize hypodensity left kidney posteriorly. Otherwise, symmetric renal enhancement. No hydroureteronephrosis.  Redundant sigmoid colon. Colonic diverticulosis. No overt colitis or diverticulitis. Normal appendix. Small hiatal hernia. Dilated small bowel loops with air-fluid levels, measuring up to 3.1 cm in diameter. Small bowel containing paraumbilical hernia is a transition point, with the small bowel distal to this point decompressed. Small amount of interloop and dependent fluid within the intraperitoneal cavity. No free intraperitoneal air. No bowel wall pneumatosis. Mildly prominent porta hepatis lymph nodes.  Enlarged/heterogeneously enhancing uterus. I cannot differentiate adnexa.  Multilevel degenerative change. Osteopenia. No acute osseous finding.  Review of the MIP images confirms the  above findings.  IMPRESSION: Small bowel obstruction, transition point is a small bowel containing paraumbilical hernia. Recommend surgical consultation.  Multifocal ectatic and aneurysmal dilated abdominal aorta, measuring up to 3.2 cm. 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  Enlarged/heterogeneously enhancing uterus may reflect fibroids however given continued enlargement, recommend nonemergent ultrasound and GYN consultation.  Right thyroid lobe nodule is questioned (the gland is obscured by streak artifact from a right shoulder arthroplasty). Recommend nonemergent thyroid ultrasound follow-up.  These results were called by telephone at the time of interpretation on 06/08/2013 at 11:05 PM to Dr. Blinda Leatherwood, who verbally acknowledged these results.   Electronically Signed  By: Jearld Lesch M.D.   On: 06/08/2013 23:08   Ct Cta Abd/pel W/cm &/or W/o Cm  06/08/2013   CLINICAL DATA:  Abdominal pain, rule out dissection  EXAM: CT ANGIOGRAPHY CHEST, ABDOMEN AND PELVIS  TECHNIQUE: Multidetector CT imaging through the chest, abdomen and pelvis was performed using the standard protocol during bolus administration of intravenous contrast. Multiplanar reconstructed images including MIPs were obtained and reviewed to evaluate the vascular anatomy.  CONTRAST:  OMNIPAQUE IOHEXOL 350 MG/ML SOLN  COMPARISON:  03/15/2008 pelvic CT without contrast, 11/22/2004 chest CT  FINDINGS: CTA CHEST FINDINGS  Aneurysmal dilatation of the ascending aorta up to 3.6 cm, tapers along the arch to a normal caliber. The aorta is tortuous in caliber as arch branch vessels, with scattered atherosclerotic disease. No aortic dissection.  Cardiomegaly. Coronary artery and mitral valvular calcifications. No pleural or pericardial effusion. No intrathoracic lymphadenopathy.  Respiratory motion degrades detail lung parenchymal evaluation. Small amount of mucus/ debris layers within the trachea. Central airways are  otherwise patent. Mild lower lobe peribronchial thickening. Linear lung base opacities, favor atelectasis or scarring. No pneumothorax. Biapical scarring.  Osteopenia and multilevel degenerative changes. Sequelae of prior posterior right rib fracture. No acute osseous finding. Right shoulder arthroplasty results in streak artifact. Streak artifact obscures the thyroid gland. There is a questionable right lobe nodule on image 17/ 290 of series 7.  Review of the MIP images confirms the above findings.  CTA ABDOMEN AND PELVIS FINDINGS  Scattered atherosclerotic disease of the aorta and branch vessels. Dilatation up to 3 cm at the level of the hiatus and the 3.3 cm at the level of the right renal artery. There is a short segment dissection at the level of the left renal artery origin as seen on images 157- 159/290. Multifocal ectasia of the infrarenal aorta up to 2.7 cm. Tortuous iliac arteries, normal caliber with scattered atherosclerotic disease.  Organ evaluation is limited in the arterial phase. Within this limitation, low-attenuation of the liver suggests fatty infiltration.  No radiodense gallstones. Mild extrahepatic biliary ductal prominence, with CBD measuring up to 8 mm to the level of the ampulla. No obstructing lesion is visualized. Unremarkable spleen. Fatty atrophy of the pancreas. New unremarkable adrenal glands.  Mildly lobular renal contours. Too small to further characterize hypodensity left kidney posteriorly. Otherwise, symmetric renal enhancement. No hydroureteronephrosis.  Redundant sigmoid colon. Colonic diverticulosis. No overt colitis or diverticulitis. Normal appendix. Small hiatal hernia. Dilated small bowel loops with air-fluid levels, measuring up to 3.1 cm in diameter. Small bowel containing paraumbilical hernia is a transition point, with the small bowel distal to this point decompressed. Small amount of interloop and dependent fluid within the intraperitoneal cavity. No free  intraperitoneal air. No bowel wall pneumatosis. Mildly prominent porta hepatis lymph nodes.  Enlarged/heterogeneously enhancing uterus. I cannot differentiate adnexa.  Multilevel degenerative change. Osteopenia. No acute osseous finding.  Review of the MIP images confirms the above findings.  IMPRESSION: Small bowel obstruction, transition point is a small bowel containing paraumbilical hernia. Recommend surgical consultation.  Multifocal ectatic and aneurysmal dilated abdominal aorta, measuring up to 3.2 cm. 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  Enlarged/heterogeneously enhancing uterus may reflect fibroids however given continued enlargement, recommend nonemergent ultrasound and GYN consultation.  Right thyroid lobe nodule is questioned (the gland is obscured by streak artifact from a right shoulder arthroplasty). Recommend nonemergent thyroid ultrasound follow-up.  These results were called by telephone at the time of interpretation on  06/08/2013 at 11:05 PM to Dr. Blinda Leatherwood, who verbally acknowledged these results.   Electronically Signed   By: Jearld Lesch M.D.   On: 06/08/2013 23:08     Assessment/Plan Principal Problem:   SBO (small bowel obstruction) Active Problems:   HYPOTHYROIDISM NOS   COPD (chronic obstructive pulmonary disease)   HTN (hypertension)   1. Small bowel obstruction - further recommendations per surgery. Patient has been kept n.p.o. in anticipation of possible surgery with pain relief medication. 2. 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 Darrick Penna vascular surgeon who has advised to quit intervention at this time. 3. Hypertension - since patient is n.p.o. patient has been placed on when necessary IV hydralazine for systolic blood pressure more than 160. 4. Hypothyroidism - for now continue IV Synthroid. 5. History of COPD - presently not wheezing. Continue inhalers.   COde Status:   full code.  Family Communication:  none.   Disposition Plan:  admit to inpatient.     Jeriah Corkum N. [facility] Pager [phone].  If 7PM-7AM, please contact night-coverage [email or web address] Password [email or web address] 06/09/2013, 1:18 AM

## 2013-06-09 NOTE — Progress Notes (Signed)
Patient seen post surgery. She is alert, in no acute distress. Denies dyspnea. Does relates pain at incision site. Patient stable. Please see admission HPI.  Alice Buchberger, Md.

## 2013-06-09 NOTE — Transfer of Care (Signed)
Immediate Anesthesia Transfer of Care Note  Patient: [name]  Procedure(s) Performed: Procedure(s): EXPLORATIORY LAPAROTOMY, HERNIA REPAIR UMBILICAL, INSERTION OF MESH (N/A)  Patient Location: PACU  Anesthesia Type:General  Level of Consciousness: awake and patient cooperative  Airway & Oxygen Therapy: Patient Spontanous Breathing and Patient connected to face mask oxygen  Post-op Assessment: Report given to PACU RN and Post -op Vital signs reviewed and stable  Post vital signs: Reviewed and stable  Complications: No apparent anesthesia complications

## 2013-06-09 NOTE — Op Note (Signed)
[name] [birth year] [id number] 06/09/2013   Preoperative diagnosis: Incarcerated umbilical hernia with partial small bowel obstruction  Postoperative diagnosis: Same  Procedure: Laparotomy, reduction of incarcerated umbilical hernia, repair with mesh  Surgeon: Currie Paris, MD, FACS  Assistant: Dr. Delane Ginger  Anesthesia: General   Clinical History and Indications: This 79 year old lady presented earlier with a small bowel obstruction documented by CT scan, With the obstruction at an incarcerated umbilical hernia. She is begun on some IV fluids and brought to the operating room    Description of Procedure: The patient was taken to the operating room and after satisfactory general endotracheal anesthesia was obtained the abdomen was prepped and drapedAnd a timeout was done The hernia had been palpable as a fairly firm mass but after anesthesia was obtained it was smaller suggesting it was partially reduced with anesthesia.I made a vertical incision curving around to the left of the umbilicus. There is some edema of the subcutaneous tissues. The hernia was identified and stripped off of the subcutaneous tissues down to the fascial defect. I couldn't tell whether there was bowel still in this or a thick wall sac with omentum. I opened the fascial level bed and was able to enter the free peritoneal cavity and the peritoneal sac was very thickened with at this point no bowel in it. I excised the sac. I was able to pull some small bowel out of look at it and ran it for several feet and in their direction from directly under the hernia everything looked fine. There is no evidence of bloody fluid in the peritoneal cavity. I did see one area that looked like it had likely been partially obstructed because of some ecchymotic tissue but everything was viable.  I then used an 8 cm ventral X. Patch to close the hernia defect. This was sutured in with 4 through and through sutures of 0  Prolene. The fascia was then closed over the mesh patch with 0 Prolene. The umbilicus had been stuck to the hernia sac and I had to excise it , As I was concerned I might injure the bowel trying to free up.  The wound was irrigated and the skin was closed with staples. The patient tolerated the procedure well. There no complications. Counts were correct. Blood loss was minimal. Currie Paris, MD, FACS 06/09/2013 12:42 PM

## 2013-06-09 NOTE — ED Provider Notes (Signed)
Medical screening examination/treatment/procedure(s) were performed by non-physician practitioner and as supervising physician I was immediately available for consultation/collaboration.  EKG Interpretation    Date/Time:    Ventricular Rate:    PR Interval:    QRS Duration:   QT Interval:    QTC Calculation:   R Axis:     Text Interpretation:               Gilda Crease, MD 06/09/13 2025

## 2013-06-10 ENCOUNTER — Encounter (HOSPITAL_COMMUNITY): Payer: Self-pay | Admitting: Surgery

## 2013-06-10 DIAGNOSIS — J9610 Chronic respiratory failure, unspecified whether with hypoxia or hypercapnia: Secondary | ICD-10-CM

## 2013-06-10 LAB — BASIC METABOLIC PANEL
BUN: 13 mg/dL (ref 6–23)
CO2: 30 mEq/L (ref 19–32)
Calcium: 8.7 mg/dL (ref 8.4–10.5)
Chloride: 103 mEq/L (ref 96–112)
Creatinine, Ser: 0.57 mg/dL (ref 0.50–1.10)
GFR calc Af Amer: >90 mL/min (ref >90)
GFR calc non Af Amer: 85 mL/min — ABNORMAL LOW (ref >90)
Glucose, Bld: 137 mg/dL — ABNORMAL HIGH (ref 70–99)
Potassium: 3.6 mEq/L (ref 3.5–5.1)
Sodium: 142 mEq/L (ref 135–145)

## 2013-06-10 LAB — CBC
HCT: 33.9 % — ABNORMAL LOW (ref 36.0–46.0)
Hemoglobin: 10.9 g/dL — ABNORMAL LOW (ref 12.0–15.0)
MCH: 30.6 pg (ref 26.0–34.0)
MCHC: 32.2 g/dL (ref 30.0–36.0)
MCV: 95.2 fL (ref 78.0–100.0)
Platelets: 228 10*3/uL (ref 150–400)
RBC: 3.56 MIL/uL — ABNORMAL LOW (ref 3.87–5.11)
RDW: 15 % (ref 11.5–15.5)
WBC: 8.8 10*3/uL (ref 4.0–10.5)

## 2013-06-10 LAB — URINE CULTURE
Colony Count: NO GROWTH
Culture: NO GROWTH
Special Requests: NORMAL

## 2013-06-10 LAB — GLUCOSE, CAPILLARY
Glucose-Capillary: 130 mg/dL — ABNORMAL HIGH (ref 70–99)
Glucose-Capillary: 109 mg/dL — ABNORMAL HIGH (ref 70–99)

## 2013-06-10 MED ORDER — MORPHINE SULFATE 2 MG/ML IJ SOLN
2.0000 mg | INTRAMUSCULAR | Status: DC | PRN
  Administered 2013-06-11 (×2): 2 mg via INTRAVENOUS
  Filled 2013-06-10 (×2): qty 1

## 2013-06-10 MED ORDER — ALPRAZOLAM 0.5 MG PO TABS
0.5000 mg | ORAL_TABLET | Freq: Once | ORAL | Status: AC
  Administered 2013-06-10: 0.5 mg via ORAL
  Filled 2013-06-10: qty 1

## 2013-06-10 MED ORDER — LEVOTHYROXINE SODIUM 100 MCG PO TABS
100.0000 ug | ORAL_TABLET | Freq: Every day | ORAL | Status: DC
  Administered 2013-06-11 – 2013-06-15 (×5): 100 ug via ORAL
  Filled 2013-06-10 (×8): qty 1

## 2013-06-10 MED ORDER — OXYCODONE-ACETAMINOPHEN 5-325 MG PO TABS
1.0000 | ORAL_TABLET | ORAL | Status: DC | PRN
  Administered 2013-06-10 (×2): 1 via ORAL
  Administered 2013-06-10: 2 via ORAL
  Filled 2013-06-10 (×2): qty 2

## 2013-06-10 NOTE — Progress Notes (Signed)
1 Day Post-Op  Subjective: Pt c/o abd pain, sore.  Passed flatus once.  No BM.  Denies n/v.    Objective: Vital signs in last 24 hours: Temp:  [97.4 F (36.3 C)-99.5 F (37.5 C)] 99.3 F (37.4 C) (12/03 0454) Pulse Rate:  [79-98] 92 (12/03 0454) Resp:  [12-20] 16 (12/03 0454) BP: (111-152)/(51-93) 151/68 mmHg (12/03 0454) SpO2:  [89 %-100 %] 94 % (12/03 0454) Last BM Date: 06/08/13  Intake/Output from previous day: 12/02 0701 - 12/03 0700 In: 3336.7 [I.V.:3336.7] Out: 125 [Urine:125] Intake/Output this shift:   PE General appearance: alert, cooperative and no distress GI: bowel sounds are present, abdomen is soft and appropriately tender. incison is c/d/i    Lab Results:   Recent Labs  06/09/13 1600 06/10/13 0418  WBC 7.1 8.8  HGB 11.5* 10.9*  HCT 36.1 33.9*  PLT 229 228   BMET  Recent Labs  06/09/13 0555 06/09/13 1600 06/10/13 0418  NA 141  --  142  K 3.5  --  3.6  CL 100  --  103  CO2 32  --  30  GLUCOSE 115*  --  137*  BUN 14  --  13  CREATININE 0.74 0.60 0.57  CALCIUM 9.4  --  8.7   PT/INR No results found for this basename: LABPROT, INR,  in the last 72 hours ABG No results found for this basename: PHART, PCO2, PO2, HCO3,  in the last 72 hours  Studies/Results: Ct Angio Chest W/cm &/or Wo Cm  06/08/2013   CLINICAL DATA:  Abdominal pain, rule out dissection  EXAM: CT ANGIOGRAPHY CHEST, ABDOMEN AND PELVIS  TECHNIQUE: Multidetector CT imaging through the chest, abdomen and pelvis was performed using the standard protocol during bolus administration of intravenous contrast. Multiplanar reconstructed images including MIPs were obtained and reviewed to evaluate the vascular anatomy.  CONTRAST:  OMNIPAQUE IOHEXOL 350 MG/ML SOLN  COMPARISON:  03/15/2008 pelvic CT without contrast, 11/22/2004 chest CT  FINDINGS: CTA CHEST FINDINGS  Aneurysmal dilatation of the ascending aorta up to 3.6 cm, tapers along the arch to a normal caliber. The aorta is tortuous  in caliber as arch branch vessels, with scattered atherosclerotic disease. No aortic dissection.  Cardiomegaly. Coronary artery and mitral valvular calcifications. No pleural or pericardial effusion. No intrathoracic lymphadenopathy.  Respiratory motion degrades detail lung parenchymal evaluation. Small amount of mucus/ debris layers within the trachea. Central airways are otherwise patent. Mild lower lobe peribronchial thickening. Linear lung base opacities, favor atelectasis or scarring. No pneumothorax. Biapical scarring.  Osteopenia and multilevel degenerative changes. Sequelae of prior posterior right rib fracture. No acute osseous finding. Right shoulder arthroplasty results in streak artifact. Streak artifact obscures the thyroid gland. There is a questionable right lobe nodule on image 17/ 290 of series 7.  Review of the MIP images confirms the above findings.  CTA ABDOMEN AND PELVIS FINDINGS  Scattered atherosclerotic disease of the aorta and branch vessels. Dilatation up to 3 cm at the level of the hiatus and the 3.3 cm at the level of the right renal artery. There is a short segment dissection at the level of the left renal artery origin as seen on images 157- 159/290. Multifocal ectasia of the infrarenal aorta up to 2.7 cm. Tortuous iliac arteries, normal caliber with scattered atherosclerotic disease.  Organ evaluation is limited in the arterial phase. Within this limitation, low-attenuation of the liver suggests fatty infiltration.  No radiodense gallstones. Mild extrahepatic biliary ductal prominence, with CBD measuring up to  8 mm to the level of the ampulla. No obstructing lesion is visualized. Unremarkable spleen. Fatty atrophy of the pancreas. New unremarkable adrenal glands.  Mildly lobular renal contours. Too small to further characterize hypodensity left kidney posteriorly. Otherwise, symmetric renal enhancement. No hydroureteronephrosis.  Redundant sigmoid colon. Colonic diverticulosis. No  overt colitis or diverticulitis. Normal appendix. Small hiatal hernia. Dilated small bowel loops with air-fluid levels, measuring up to 3.1 cm in diameter. Small bowel containing paraumbilical hernia is a transition point, with the small bowel distal to this point decompressed. Small amount of interloop and dependent fluid within the intraperitoneal cavity. No free intraperitoneal air. No bowel wall pneumatosis. Mildly prominent porta hepatis lymph nodes.  Enlarged/heterogeneously enhancing uterus. I cannot differentiate adnexa.  Multilevel degenerative change. Osteopenia. No acute osseous finding.  Review of the MIP images confirms the above findings.  IMPRESSION: Small bowel obstruction, transition point is a small bowel containing paraumbilical hernia. Recommend surgical consultation.  Multifocal ectatic and aneurysmal dilated abdominal aorta, measuring up to 3.2 cm. 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  Enlarged/heterogeneously enhancing uterus may reflect fibroids however given continued enlargement, recommend nonemergent ultrasound and GYN consultation.  Right thyroid lobe nodule is questioned (the gland is obscured by streak artifact from a right shoulder arthroplasty). Recommend nonemergent thyroid ultrasound follow-up.  These results were called by telephone at the time of interpretation on 06/08/2013 at 11:05 PM to Dr. Blinda Leatherwood, who verbally acknowledged these results.   Electronically Signed   By: Jearld Lesch M.D.   On: 06/08/2013 23:08   Ct Cta Abd/pel W/cm &/or W/o Cm  06/08/2013   CLINICAL DATA:  Abdominal pain, rule out dissection  EXAM: CT ANGIOGRAPHY CHEST, ABDOMEN AND PELVIS  TECHNIQUE: Multidetector CT imaging through the chest, abdomen and pelvis was performed using the standard protocol during bolus administration of intravenous contrast. Multiplanar reconstructed images including MIPs were obtained and reviewed to evaluate the vascular anatomy.  CONTRAST:   OMNIPAQUE IOHEXOL 350 MG/ML SOLN  COMPARISON:  03/15/2008 pelvic CT without contrast, 11/22/2004 chest CT  FINDINGS: CTA CHEST FINDINGS  Aneurysmal dilatation of the ascending aorta up to 3.6 cm, tapers along the arch to a normal caliber. The aorta is tortuous in caliber as arch branch vessels, with scattered atherosclerotic disease. No aortic dissection.  Cardiomegaly. Coronary artery and mitral valvular calcifications. No pleural or pericardial effusion. No intrathoracic lymphadenopathy.  Respiratory motion degrades detail lung parenchymal evaluation. Small amount of mucus/ debris layers within the trachea. Central airways are otherwise patent. Mild lower lobe peribronchial thickening. Linear lung base opacities, favor atelectasis or scarring. No pneumothorax. Biapical scarring.  Osteopenia and multilevel degenerative changes. Sequelae of prior posterior right rib fracture. No acute osseous finding. Right shoulder arthroplasty results in streak artifact. Streak artifact obscures the thyroid gland. There is a questionable right lobe nodule on image 17/ 290 of series 7.  Review of the MIP images confirms the above findings.  CTA ABDOMEN AND PELVIS FINDINGS  Scattered atherosclerotic disease of the aorta and branch vessels. Dilatation up to 3 cm at the level of the hiatus and the 3.3 cm at the level of the right renal artery. There is a short segment dissection at the level of the left renal artery origin as seen on images 157- 159/290. Multifocal ectasia of the infrarenal aorta up to 2.7 cm. Tortuous iliac arteries, normal caliber with scattered atherosclerotic disease.  Organ evaluation is limited in the arterial phase. Within this limitation, low-attenuation of the  liver suggests fatty infiltration.  No radiodense gallstones. Mild extrahepatic biliary ductal prominence, with CBD measuring up to 8 mm to the level of the ampulla. No obstructing lesion is visualized. Unremarkable spleen. Fatty atrophy of the  pancreas. New unremarkable adrenal glands.  Mildly lobular renal contours. Too small to further characterize hypodensity left kidney posteriorly. Otherwise, symmetric renal enhancement. No hydroureteronephrosis.  Redundant sigmoid colon. Colonic diverticulosis. No overt colitis or diverticulitis. Normal appendix. Small hiatal hernia. Dilated small bowel loops with air-fluid levels, measuring up to 3.1 cm in diameter. Small bowel containing paraumbilical hernia is a transition point, with the small bowel distal to this point decompressed. Small amount of interloop and dependent fluid within the intraperitoneal cavity. No free intraperitoneal air. No bowel wall pneumatosis. Mildly prominent porta hepatis lymph nodes.  Enlarged/heterogeneously enhancing uterus. I cannot differentiate adnexa.  Multilevel degenerative change. Osteopenia. No acute osseous finding.  Review of the MIP images confirms the above findings.  IMPRESSION: Small bowel obstruction, transition point is a small bowel containing paraumbilical hernia. Recommend surgical consultation.  Multifocal ectatic and aneurysmal dilated abdominal aorta, measuring up to 3.2 cm. 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  Enlarged/heterogeneously enhancing uterus may reflect fibroids however given continued enlargement, recommend nonemergent ultrasound and GYN consultation.  Right thyroid lobe nodule is questioned (the gland is obscured by streak artifact from a right shoulder arthroplasty). Recommend nonemergent thyroid ultrasound follow-up.  These results were called by telephone at the time of interpretation on 06/08/2013 at 11:05 PM to Dr. Blinda Leatherwood, who verbally acknowledged these results.   Electronically Signed   By: Jearld Lesch M.D.   On: 06/08/2013 23:08    Anti-infectives: Anti-infectives   Start     Dose/Rate Route Frequency Ordered Stop   06/09/13 1030  ceFAZolin (ANCEF) IVPB 2 g/50 mL premix  Status:  Discontinued      2 g 100 mL/hr over 30 Minutes Intravenous On call to O.R. 06/09/13 1016 06/09/13 1511   06/09/13 1018  ceFAZolin (ANCEF) 2-3 GM-% IVPB SOLR    Comments:  [name]   : cabinet override      06/09/13 1018 06/09/13 2229      Assessment/Plan: S/p Ex lap, reduction of incarcerated umbilical hernia, repair with mesh(Dr. Jamey Ripa 06/09/13) POD#1 -she takes percocet 7.5/325mg  at home, change morphine to 2mg  as she likely has some tolerance to pain medication.  Add percocet. -PT eval to aide with mobilization -IS -sips of clears today, advance diet slowly     LOS: 2 days   Iliya Spivack  ANP-BC  06/10/2013 7:46 AM

## 2013-06-10 NOTE — Progress Notes (Signed)
Pt. Has a large episode of dark drown watery emesis. Hospitalist notified. Hospitalist request to call surgery since pt. Has surgery today. Surgery notified. Surgery request to give zofran and place NG tube if vomiting continues.  Will continue to monitor pt.

## 2013-06-10 NOTE — Progress Notes (Signed)
Patient confused off and on during the 12 hour day shift.  Turned bed alarm on after patient was found standing beside bed with husband seeing bugs on the wall.  Patient tearful at end of shift saying people are expecting things out of her that she can't give such as doing for herself.  I assured patient staff was here to help her.  Patient called husband and told him he better come back to the hospital or he would be sorry.  I reassured husband on the phone that patient was fine and had staff available to help her during the night for any needs.  Sent text to on call provider requesting Xanax as patient takes 0.5 mg Xanax at home twice daily as needed.

## 2013-06-10 NOTE — Evaluation (Signed)
Physical Therapy Evaluation Patient Details Name: [name] MRN: [id number] DOB: 06-19-1931 Today's Date: 06/10/2013 Time: 4098-1191 PT Time Calculation (min): 28 min  PT Assessment / Plan / Recommendation History of Present Illness  s/p laparotmy; reducion of umblilical hernia . PMH includes osteoporosis, insomnia, COPD (pt on 3L O2 at home), hypothyroidism.   Clinical Impression  Pt adm due to the above. Presents with decreased independence with functional mobility secondary to deficits listed below. Pt to benefit from skilled PT to maximize functional independence and decrease caregiver burden. Pt will need to be mod I to supervision to D/C home with husband and HHPT. Pt requiring increased (A) at this time for mobility; will recommend SNF for post acute rehab at this time. Will recommend RW for mobility upon acute D/C.     PT Assessment  Patient needs continued PT services    Follow Up Recommendations  SNF;Supervision/Assistance - 24 hour    Does the patient have the potential to tolerate intense rehabilitation      Barriers to Discharge Decreased caregiver support husband is unable to provide physical (A) for mobility; will need to be mod I to supervision for safe D/C home     Equipment Recommendations  None recommended by PT    Recommendations for Other Services OT consult   Frequency Min 3X/week    Precautions / Restrictions Precautions Precautions: Fall Precaution Comments: had a recent fall over oxygen cord; pt is on 3L O2  Restrictions Weight Bearing Restrictions: No   Pertinent Vitals/Pain Pt c/o pain in abdomen but did not rate pain. Reports pain is limiting her ability to walk.      Mobility  Bed Mobility Bed Mobility: Supine to Sit;Sitting - Scoot to Edge of Bed Supine to Sit: 4: Min assist;HOB elevated;With rails Sitting - Scoot to Edge of Bed: 5: Supervision Details for Bed Mobility Assistance: pt required (A) to bring shoulders to sitting position at  EOB; cues for sequencing and hand placement; pt can be self limiting; cues to increase independence with mobility  Transfers Transfers: Sit to Stand;Stand to Sit Sit to Stand: 4: Min assist;From bed;With upper extremity assist Stand to Sit: To chair/3-in-1;With armrests;With upper extremity assist;4: Min assist Details for Transfer Assistance: pt required (A) to achieve upright standing position and maintain balance; pt unsteady with transfers; required (A) to control descent to chair; pt attempting to sit prematurely; cues for safety and hand placement  Ambulation/Gait Ambulation/Gait Assistance: 3: Mod assist Ambulation Distance (Feet): 14 Feet Assistive device: 1 person hand held assist Ambulation/Gait Assistance Details: pt unsteady with gt; pt was reaching for external support with Rt UE; cues for safety and upright posture; will benefit from RW to increase stability  Gait Pattern: Decreased stride length;Trunk flexed;Wide base of support;Shuffle Gait velocity: decreased  Stairs: No Wheelchair Mobility Wheelchair Mobility: No         PT Diagnosis: Difficulty walking;Acute pain  PT Problem List: Decreased balance;Decreased mobility;Decreased activity tolerance;Decreased knowledge of use of DME;Pain;Decreased safety awareness;Cardiopulmonary status limiting activity PT Treatment Interventions: DME instruction;Gait training;Functional mobility training;Therapeutic activities;Therapeutic exercise;Balance training;Neuromuscular re-education;Patient/family education     PT Goals(Current goals can be found in the care plan section) Acute Rehab PT Goals Patient Stated Goal: to not have to go to rehab PT Goal Formulation: With patient Time For Goal Achievement: 06/17/13 Potential to Achieve Goals: Good  Visit Information  Last PT Received On: 06/10/13 Assistance Needed: +1 History of Present Illness: s/p laparotmy; reducion of umblilical hernia  Prior Functioning  Home  Living Family/patient expects to be discharged to:: Private residence Living Arrangements: Spouse/significant other Available Help at Discharge: Family;Available 24 hours/day Type of Home: House Home Access: Ramped entrance Home Layout: One level Home Equipment: Walker - 2 wheels;Shower seat;Bedside commode;Grab bars - toilet;Grab bars - tub/shower;Hand held shower head Additional Comments: pt reports she has handicap bathroom  Prior Function Level of Independence: Independent Comments: pt reports her and her husband will help each other at times if they need it; was independent prior to admission  Communication Communication: HOH Dominant Hand: Right    Cognition  Cognition Arousal/Alertness: Awake/alert Behavior During Therapy: WFL for tasks assessed/performed Overall Cognitive Status: Within Functional Limits for tasks assessed    Extremity/Trunk Assessment Upper Extremity Assessment Upper Extremity Assessment: Defer to OT evaluation Lower Extremity Assessment Lower Extremity Assessment: Overall WFL for tasks assessed Cervical / Trunk Assessment Cervical / Trunk Assessment: Normal   Balance Balance Balance Assessed: Yes Static Sitting Balance Static Sitting - Balance Support: Feet supported;Bilateral upper extremity supported Static Sitting - Level of Assistance: 5: Stand by assistance Static Standing Balance Static Standing - Balance Support: Left upper extremity supported;During functional activity Static Standing - Level of Assistance: 4: Min assist  End of Session PT - End of Session Equipment Utilized During Treatment: Gait belt;Oxygen Activity Tolerance: Patient limited by fatigue;Patient limited by pain Patient left: in chair;with call bell/phone within reach;with family/visitor present Nurse Communication: Mobility status  GP     Donell Sievert, [place] [phone] 06/10/2013, 1:21 PM

## 2013-06-10 NOTE — Anesthesia Postprocedure Evaluation (Signed)
Anesthesia Post Note  Patient: [name]  Procedure(s) Performed: Procedure(s) (LRB): EXPLORATIORY LAPAROTOMY, HERNIA REPAIR UMBILICAL, INSERTION OF MESH (N/A)  Anesthesia type: general  Patient location: PACU  Post pain: Pain level controlled  Post assessment: Patient's Cardiovascular Status Stable  Last Vitals:  Filed Vitals:   06/10/13 1404  BP: 133/81  Pulse: 76  Temp: 36.9 C  Resp: 16    Post vital signs: Reviewed and stable  Level of consciousness: sedated  Complications: No apparent anesthesia complications

## 2013-06-10 NOTE — Progress Notes (Signed)
[facility] PROGRESS NOTE  [name] ZOX:[id number] DOB: 1931/04/11 DOA: 06/08/2013 PCP: Crawford Givens, MD  Assessment/Plan: 79 y.o. female with history of hypertension, COPD, hypothyroidism presented to the ER because of sudden onset of abdominal pain radiating to the back since last evening. CT abdomen and pelvis was done in the ER which shows features consistent with small bowel obstruction with transition point at small bowel containing paraumblical hernia  1. SBO s/p Ex lap, reduction of incarcerated umbilical hernia, repair with mesh(Dr. Jamey Ripa 06/09/13) -cont management per surgery   2. 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 Darrick Penna vascular surgeon who has advised no acute intervention at this time.  3. Hypertension , resume amlodipine; IV hydralazine for systolic blood pressure more than 160  4. Hypothyroidism; cont levothyroxine;   5. COPD/chronic resp failure on home oxygen; no s/s of acute exacerbation;'  -cont home regimen   Code Status: full Family Communication: daughter at the bedsde (indicate person spoken with, relationship, and if by phone, the number) Disposition Plan: hoem when ready   Consultants:  surgery  Procedures:  SBO, hernia repair   Antibiotics:  None  (indicate start date, and stop date if known)  HPI/Subjective: alert  Objective: Filed Vitals:   06/10/13 0959  BP: 105/65  Pulse: 81  Temp: 98.9 F (37.2 C)  Resp: 20    Intake/Output Summary (Last 24 hours) at 06/10/13 1108 Last data filed at 06/10/13 8119  Gross per 24 hour  Intake 2961.67 ml  Output      0 ml  Net 2961.67 ml   Filed Weights   06/08/13 1854 06/09/13 0110  Weight: 86.183 kg (190 lb) 87.227 kg (192 lb 4.8 oz)    Exam:   General:  alert  Cardiovascular: s1,s2 rrr  Respiratory: CTA BL  Abdomen: soft, mild tender,   Musculoskeletal: noEdema    Data Reviewed: Basic Metabolic  Panel:  Recent Labs Lab 06/08/13 1919 06/09/13 0555 06/09/13 1600 06/10/13 0418  NA 140 141  --  142  K 3.8 3.5  --  3.6  CL 99 100  --  103  CO2 32 32  --  30  GLUCOSE 113* 115*  --  137*  BUN 14 14  --  13  CREATININE 0.68 0.74 0.60 0.57  CALCIUM 9.5 9.4  --  8.7   Liver Function Tests:  Recent Labs Lab 06/09/13 0555  AST 24  ALT 8  ALKPHOS 65  BILITOT 0.6  PROT 7.0  ALBUMIN 3.4*   No results found for this basename: LIPASE, AMYLASE,  in the last 168 hours No results found for this basename: AMMONIA,  in the last 168 hours CBC:  Recent Labs Lab 06/08/13 1919 06/09/13 0555 06/09/13 1600 06/10/13 0418  WBC 8.7 7.9 7.1 8.8  NEUTROABS 6.4  --   --   --   HGB 12.3 12.0 11.5* 10.9*  HCT 37.7 37.8 36.1 33.9*  MCV 95.2 95.9 96.0 95.2  PLT 244 244 229 228   Cardiac Enzymes: No results found for this basename: CKTOTAL, CKMB, CKMBINDEX, TROPONINI,  in the last 168 hours BNP (last 3 results) No results found for this basename: PROBNP,  in the last 8760 hours CBG:  Recent Labs Lab 06/09/13 0528 06/09/13 0919 06/09/13 2348 06/10/13 0512  GLUCAP 123* 112* 115* 130*    Recent Results (from the past 240 hour(s))  SURGICAL PCR SCREEN     Status: None  Collection Time    06/09/13  2:47 AM      Result Value Range Status   MRSA, PCR NEGATIVE  NEGATIVE Final   Staphylococcus aureus NEGATIVE  NEGATIVE Final   Comment:            The Xpert SA Assay (FDA     approved for NASAL specimens     in patients over 33 years of age),     is one component of     a comprehensive surveillance     program.  Test performance has     been validated by [employer] for patients greater     than or equal to 78 year old.     It is not intended     to diagnose infection nor to     guide or monitor treatment.  URINE CULTURE     Status: None   Collection Time    06/09/13  5:33 AM      Result Value Range Status   Specimen Description URINE, CLEAN CATCH   Final   Special  Requests Normal   Final   Culture  Setup Time     Final   Value: 06/09/2013 05:33     Performed at [facility] Count     Final   Value: NO GROWTH     Performed at [facility]   Culture     Final   Value: NO GROWTH     Performed at [facility]   Report Status 06/10/2013 FINAL   Final     Studies: Ct Angio Chest W/cm &/or Wo Cm  06/08/2013   CLINICAL DATA:  Abdominal pain, rule out dissection  EXAM: CT ANGIOGRAPHY CHEST, ABDOMEN AND PELVIS  TECHNIQUE: Multidetector CT imaging through the chest, abdomen and pelvis was performed using the standard protocol during bolus administration of intravenous contrast. Multiplanar reconstructed images including MIPs were obtained and reviewed to evaluate the vascular anatomy.  CONTRAST:  OMNIPAQUE IOHEXOL 350 MG/ML SOLN  COMPARISON:  03/15/2008 pelvic CT without contrast, 11/22/2004 chest CT  FINDINGS: CTA CHEST FINDINGS  Aneurysmal dilatation of the ascending aorta up to 3.6 cm, tapers along the arch to a normal caliber. The aorta is tortuous in caliber as arch branch vessels, with scattered atherosclerotic disease. No aortic dissection.  Cardiomegaly. Coronary artery and mitral valvular calcifications. No pleural or pericardial effusion. No intrathoracic lymphadenopathy.  Respiratory motion degrades detail lung parenchymal evaluation. Small amount of mucus/ debris layers within the trachea. Central airways are otherwise patent. Mild lower lobe peribronchial thickening. Linear lung base opacities, favor atelectasis or scarring. No pneumothorax. Biapical scarring.  Osteopenia and multilevel degenerative changes. Sequelae of prior posterior right rib fracture. No acute osseous finding. Right shoulder arthroplasty results in streak artifact. Streak artifact obscures the thyroid gland. There is a questionable right lobe nodule on image 17/ 290 of series 7.  Review of the MIP images confirms the above findings.  CTA ABDOMEN AND  PELVIS FINDINGS  Scattered atherosclerotic disease of the aorta and branch vessels. Dilatation up to 3 cm at the level of the hiatus and the 3.3 cm at the level of the right renal artery. There is a short segment dissection at the level of the left renal artery origin as seen on images 157- 159/290. Multifocal ectasia of the infrarenal aorta up to 2.7 cm. Tortuous iliac arteries, normal caliber with scattered atherosclerotic disease.  Organ evaluation is limited in  the arterial phase. Within this limitation, low-attenuation of the liver suggests fatty infiltration.  No radiodense gallstones. Mild extrahepatic biliary ductal prominence, with CBD measuring up to 8 mm to the level of the ampulla. No obstructing lesion is visualized. Unremarkable spleen. Fatty atrophy of the pancreas. New unremarkable adrenal glands.  Mildly lobular renal contours. Too small to further characterize hypodensity left kidney posteriorly. Otherwise, symmetric renal enhancement. No hydroureteronephrosis.  Redundant sigmoid colon. Colonic diverticulosis. No overt colitis or diverticulitis. Normal appendix. Small hiatal hernia. Dilated small bowel loops with air-fluid levels, measuring up to 3.1 cm in diameter. Small bowel containing paraumbilical hernia is a transition point, with the small bowel distal to this point decompressed. Small amount of interloop and dependent fluid within the intraperitoneal cavity. No free intraperitoneal air. No bowel wall pneumatosis. Mildly prominent porta hepatis lymph nodes.  Enlarged/heterogeneously enhancing uterus. I cannot differentiate adnexa.  Multilevel degenerative change. Osteopenia. No acute osseous finding.  Review of the MIP images confirms the above findings.  IMPRESSION: Small bowel obstruction, transition point is a small bowel containing paraumbilical hernia. Recommend surgical consultation.  Multifocal ectatic and aneurysmal dilated abdominal aorta, measuring up to 3.2 cm. Small focal  dissection at the level of the left renal artery origin.  Enlarged/heterogeneously enhancing uterus may reflect fibroids however given continued enlargement, recommend nonemergent ultrasound and GYN consultation.  Right thyroid lobe nodule is questioned (the gland is obscured by streak artifact from a right shoulder arthroplasty). Recommend nonemergent thyroid ultrasound follow-up.  These results were called by telephone at the time of interpretation on 06/08/2013 at 11:05 PM to Dr. Blinda Leatherwood, who verbally acknowledged these results.   Electronically Signed   By: Jearld Lesch M.D.   On: 06/08/2013 23:08   Ct Cta Abd/pel W/cm &/or W/o Cm  06/08/2013   CLINICAL DATA:  Abdominal pain, rule out dissection  EXAM: CT ANGIOGRAPHY CHEST, ABDOMEN AND PELVIS  TECHNIQUE: Multidetector CT imaging through the chest, abdomen and pelvis was performed using the standard protocol during bolus administration of intravenous contrast. Multiplanar reconstructed images including MIPs were obtained and reviewed to evaluate the vascular anatomy.  CONTRAST:  OMNIPAQUE IOHEXOL 350 MG/ML SOLN  COMPARISON:  03/15/2008 pelvic CT without contrast, 11/22/2004 chest CT  FINDINGS: CTA CHEST FINDINGS  Aneurysmal dilatation of the ascending aorta up to 3.6 cm, tapers along the arch to a normal caliber. The aorta is tortuous in caliber as arch branch vessels, with scattered atherosclerotic disease. No aortic dissection.  Cardiomegaly. Coronary artery and mitral valvular calcifications. No pleural or pericardial effusion. No intrathoracic lymphadenopathy.  Respiratory motion degrades detail lung parenchymal evaluation. Small amount of mucus/ debris layers within the trachea. Central airways are otherwise patent. Mild lower lobe peribronchial thickening. Linear lung base opacities, favor atelectasis or scarring. No pneumothorax. Biapical scarring.  Osteopenia and multilevel degenerative changes. Sequelae of prior posterior right rib fracture.  No acute osseous finding. Right shoulder arthroplasty results in streak artifact. Streak artifact obscures the thyroid gland. There is a questionable right lobe nodule on image 17/ 290 of series 7.  Review of the MIP images confirms the above findings.  CTA ABDOMEN AND PELVIS FINDINGS  Scattered atherosclerotic disease of the aorta and branch vessels. Dilatation up to 3 cm at the level of the hiatus and the 3.3 cm at the level of the right renal artery. There is a short segment dissection at the level of the left renal artery origin as seen on images 157- 159/290. Multifocal ectasia of the infrarenal aorta up  to 2.7 cm. Tortuous iliac arteries, normal caliber with scattered atherosclerotic disease.  Organ evaluation is limited in the arterial phase. Within this limitation, low-attenuation of the liver suggests fatty infiltration.  No radiodense gallstones. Mild extrahepatic biliary ductal prominence, with CBD measuring up to 8 mm to the level of the ampulla. No obstructing lesion is visualized. Unremarkable spleen. Fatty atrophy of the pancreas. New unremarkable adrenal glands.  Mildly lobular renal contours. Too small to further characterize hypodensity left kidney posteriorly. Otherwise, symmetric renal enhancement. No hydroureteronephrosis.  Redundant sigmoid colon. Colonic diverticulosis. No overt colitis or diverticulitis. Normal appendix. Small hiatal hernia. Dilated small bowel loops with air-fluid levels, measuring up to 3.1 cm in diameter. Small bowel containing paraumbilical hernia is a transition point, with the small bowel distal to this point decompressed. Small amount of interloop and dependent fluid within the intraperitoneal cavity. No free intraperitoneal air. No bowel wall pneumatosis. Mildly prominent porta hepatis lymph nodes.  Enlarged/heterogeneously enhancing uterus. I cannot differentiate adnexa.  Multilevel degenerative change. Osteopenia. No acute osseous finding.  Review of the MIP images  confirms the above findings.  IMPRESSION: Small bowel obstruction, transition point is a small bowel containing paraumbilical hernia. Recommend surgical consultation.  Multifocal ectatic and aneurysmal dilated abdominal aorta, measuring up to 3.2 cm. 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  Enlarged/heterogeneously enhancing uterus may reflect fibroids however given continued enlargement, recommend nonemergent ultrasound and GYN consultation.  Right thyroid lobe nodule is questioned (the gland is obscured by streak artifact from a right shoulder arthroplasty). Recommend nonemergent thyroid ultrasound follow-up.  These results were called by telephone at the time of interpretation on 06/08/2013 at 11:05 PM to Dr. Blinda Leatherwood, who verbally acknowledged these results.   Electronically Signed   By: Jearld Lesch M.D.   On: 06/08/2013 23:08    Scheduled Meds: . antiseptic oral rinse  15 mL Mouth Rinse q12n4p  . chlorhexidine  15 mL Mouth Rinse BID  . fluticasone  1 spray Each Nare Daily  . heparin  5,000 Units Subcutaneous Q8H  . levothyroxine  56 mcg Intravenous QAC breakfast  . pantoprazole (PROTONIX) IV  40 mg Intravenous Q24H  . tiotropium  18 mcg Inhalation Daily   Continuous Infusions: . dextrose 5 % and 0.45 % NaCl with KCl 20 mEq/L 100 mL/hr at 06/09/13 1715    Principal Problem:   SBO (small bowel obstruction) Active Problems:   HYPOTHYROIDISM NOS   COPD (chronic obstructive pulmonary disease)   HTN (hypertension)    Time spent: >35 minutes    Esperanza Sheets  [facility] Pager [phone]. If 7PM-7AM, please contact night-coverage at [email or web address], password [facility] 06/10/2013, 11:08 AM  LOS: 2 days

## 2013-06-10 NOTE — Progress Notes (Signed)
Agree with A&P of ER,NP. Patient seems a bit better tahn when ER saw her earlier today. Appears very comfortable, no issues

## 2013-06-11 DIAGNOSIS — G9340 Encephalopathy, unspecified: Secondary | ICD-10-CM

## 2013-06-11 LAB — CBC
HCT: 31.4 % — ABNORMAL LOW (ref 36.0–46.0)
Hemoglobin: 10.1 g/dL — ABNORMAL LOW (ref 12.0–15.0)
MCH: 30.5 pg (ref 26.0–34.0)
MCHC: 32.2 g/dL (ref 30.0–36.0)
MCV: 94.9 fL (ref 78.0–100.0)
Platelets: 208 10*3/uL (ref 150–400)
RBC: 3.31 MIL/uL — ABNORMAL LOW (ref 3.87–5.11)
RDW: 14.8 % (ref 11.5–15.5)
WBC: 8.1 10*3/uL (ref 4.0–10.5)

## 2013-06-11 LAB — BASIC METABOLIC PANEL
BUN: 6 mg/dL (ref 6–23)
CO2: 29 mEq/L (ref 19–32)
Calcium: 8.6 mg/dL (ref 8.4–10.5)
Chloride: 102 mEq/L (ref 96–112)
Creatinine, Ser: 0.54 mg/dL (ref 0.50–1.10)
GFR calc Af Amer: >90 mL/min (ref >90)
GFR calc non Af Amer: 87 mL/min — ABNORMAL LOW (ref >90)
Glucose, Bld: 115 mg/dL — ABNORMAL HIGH (ref 70–99)
Potassium: 3.3 mEq/L — ABNORMAL LOW (ref 3.5–5.1)
Sodium: 140 mEq/L (ref 135–145)

## 2013-06-11 LAB — URINALYSIS, ROUTINE W REFLEX MICROSCOPIC
Bilirubin Urine: NEGATIVE
Glucose, UA: NEGATIVE mg/dL
Ketones, ur: NEGATIVE mg/dL
Leukocytes, UA: NEGATIVE
Nitrite: NEGATIVE
Protein, ur: NEGATIVE mg/dL
Specific Gravity, Urine: 1.006 (ref 1.005–1.030)
Urobilinogen, UA: 0.2 mg/dL (ref 0.0–1.0)
pH: 7 (ref 5.0–8.0)

## 2013-06-11 LAB — URINE MICROSCOPIC-ADD ON

## 2013-06-11 MED ORDER — POLYETHYLENE GLYCOL 3350 17 G PO PACK
17.0000 g | PACK | Freq: Every day | ORAL | Status: DC
  Administered 2013-06-11 – 2013-06-15 (×5): 17 g via ORAL
  Filled 2013-06-11 (×6): qty 1

## 2013-06-11 MED ORDER — OXYCODONE-ACETAMINOPHEN 5-325 MG PO TABS: 0.5000 | ORAL_TABLET | ORAL | Status: DC | PRN

## 2013-06-11 MED ORDER — ALPRAZOLAM 0.5 MG PO TABS
0.5000 mg | ORAL_TABLET | Freq: Two times a day (BID) | ORAL | Status: DC | PRN
  Administered 2013-06-12 – 2013-06-14 (×5): 0.5 mg via ORAL
  Filled 2013-06-11 (×5): qty 1

## 2013-06-11 MED ORDER — TRAMADOL HCL 50 MG PO TABS
50.0000 mg | ORAL_TABLET | Freq: Four times a day (QID) | ORAL | Status: DC
  Administered 2013-06-11 – 2013-06-12 (×5): 50 mg via ORAL
  Filled 2013-06-11 (×5): qty 1

## 2013-06-11 MED ORDER — TRAZODONE HCL 50 MG PO TABS
50.0000 mg | ORAL_TABLET | Freq: Every evening | ORAL | Status: DC | PRN
  Administered 2013-06-11 – 2013-06-13 (×2): 50 mg via ORAL
  Filled 2013-06-11 (×3): qty 1

## 2013-06-11 MED ORDER — PANTOPRAZOLE SODIUM 40 MG PO TBEC
40.0000 mg | DELAYED_RELEASE_TABLET | Freq: Every day | ORAL | Status: DC
  Administered 2013-06-12 – 2013-06-14 (×3): 40 mg via ORAL
  Filled 2013-06-11 (×3): qty 1

## 2013-06-11 MED ORDER — ALPRAZOLAM 0.5 MG PO TABS
0.5000 mg | ORAL_TABLET | Freq: Two times a day (BID) | ORAL | Status: DC
  Administered 2013-06-11: 0.5 mg via ORAL
  Filled 2013-06-11: qty 1

## 2013-06-11 NOTE — Progress Notes (Signed)
Agree with A&P of ER,NP. Patient takes xanax routinely so will restart. She may need to be on sleep meds as well. Wound looks OK for now, but will keep an ey on it. I think it is just some bruisiing

## 2013-06-11 NOTE — Progress Notes (Signed)
Patient very confused this morning, required sitter during the night due to attempting to get out of bed stating she was leaving and going home.  Patient seeing people in the room that are not there.    Patient receives Xanax .5 mg at home twice daily for anxiety but is not prescribed that in the hospital.  Did receive one dose of Xanax last night from on call provider.  Night nurse states patient did not sleep at all last night or the night before.

## 2013-06-11 NOTE — Progress Notes (Signed)
Seen and agreed 08/01/2012 Eulis Salazar Elizabeth PTA [phone] pager [phone] office      

## 2013-06-11 NOTE — Progress Notes (Signed)
Physical Therapy Treatment Patient Details Name: [name] MRN: [id number] DOB: 1930-11-04 Today's Date: 06/11/2013 Time: 4098-1191 PT Time Calculation (min): 32 min  PT Assessment / Plan / Recommendation  History of Present Illness s/p laparotmy; reducion of umblilical hernia    PT Comments   Pt requiring max encouragement to participate with PT today. Pt more confused today per daughter; during tx pt seeing objects; nursing notified and said that this occurred after previous surgery. Pt had poor safety awareness today with mobility; unsafe to walk independently.  Pt will benefit from continued PT in SNF to maximize functional I prior to d/c home.  Follow Up Recommendations  SNF;Supervision/Assistance - 24 hour     Does the patient have the potential to tolerate intense rehabilitation     Barriers to Discharge        Equipment Recommendations       Recommendations for Other Services OT consult  Frequency Min 3X/week   Progress towards PT Goals Progress towards PT goals: Progressing toward goals  Plan Current plan remains appropriate    Precautions / Restrictions Precautions Precautions: Fall Precaution Comments: had a recent fall over oxygen cord; pt is on 3L O2  Restrictions Weight Bearing Restrictions: No   Pertinent Vitals/Pain 3/10 abdominal pain; repositioned post tx     Mobility  Bed Mobility Bed Mobility: Supine to Sit;Sitting - Scoot to [place] of Bed;Sit to Supine Supine to Sit: 4: Min assist Sitting - Scoot to [place] of Bed: 4: Min assist Sit to Supine: 4: Min assist Details for Bed Mobility Assistance: bed mobility without rails and HOB; A required to bring trunk upright and LE's off of bed despite vc's Transfers Transfers: Sit to Stand;Stand to Sit;Stand Pivot Transfers Sit to Stand: 4: Min guard;From chair/3-in-1;From bed Stand to Sit: 4: Min guard;To chair/3-in-1;To bed Stand Pivot Transfers: 4: Min assist Details for Transfer Assistance: vc's for  sequening and hand placment for safe technique Ambulation/Gait Ambulation/Gait Assistance: 3: Mod assist Ambulation Distance (Feet): 90 Feet Assistive device: Rolling walker Ambulation/Gait Assistance Details: Pt unsteady with gt, improved some with RW. VC's for RW use: stay closer to RW, erect posture. Poor safety awareness as pt was asked x3 to slow down due to fall risk and unsteadiness.  Gait Pattern: Decreased stride length;Trunk flexed;Wide base of support;Step-through pattern Gait velocity: inconsistent    Exercises     PT Diagnosis:    PT Problem List:   PT Treatment Interventions:     PT Goals (current goals can now be found in the care plan section)    Visit Information  Last PT Received On: 06/11/13 Assistance Needed: +1 History of Present Illness: s/p laparotmy; reducion of umblilical hernia     Subjective Data      Cognition  Cognition Arousal/Alertness: Awake/alert Behavior During Therapy: Impulsive Overall Cognitive Status: Impaired/Different from baseline Area of Impairment: Safety/judgement;Awareness;Problem solving;Memory Memory: Decreased short-term memory Safety/Judgement: Decreased awareness of safety;Decreased awareness of deficits Awareness: Intellectual Problem Solving: Difficulty sequencing;Requires verbal cues;Requires tactile cues General Comments: A&Ox4 however pt stated throughout that she was not at the hospital but when asked "where are we" she replied "[facility]". Pt seeing objects (lamp, writing on 3n1, drawing on wall) and very confused today.    Balance     End of Session PT - End of Session Equipment Utilized During Treatment: Gait belt;Oxygen Activity Tolerance: Patient limited by fatigue Patient left: in bed;with call bell/phone within reach;with family/visitor present Nurse Communication: Mobility status (confusion)   GP  Ernestina Columbia , SPTA  06/11/2013, 11:55 AM

## 2013-06-11 NOTE — Progress Notes (Signed)
[facility] PROGRESS NOTE  [name] ZOX:[id number] DOB: 08/21/1930 DOA: 06/08/2013 PCP: Crawford Givens, MD  Assessment/Plan: 79 y.o. female with history of hypertension, COPD, hypothyroidism presented to the ER because of sudden onset of abdominal pain radiating to the back since last evening. CT abdomen and pelvis was done in the ER which shows features consistent with small bowel obstruction with transition point at small bowel containing paraumblical hernia  1. SBO s/p Ex lap, reduction of incarcerated umbilical hernia, repair with mesh(Dr. Jamey Ripa 06/09/13) -cont management per surgery   2. 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 Darrick Penna vascular surgeon who has advised no acute intervention at this time.  3. Hypertension , resume amlodipine; IV hydralazine for systolic blood pressure more than 160  4. Hypothyroidism; cont levothyroxine;   5. COPD/chronic resp failure on home oxygen; no s/s of acute exacerbation;'  -cont home regimen   6. Encephalopathy, delirium likely med related, opioids vs benzo given last night; neuro exam no focal; no s/s of infection; clinically better today;  -decrease narcotics, started tramadol , resumed xanax per surgery, but close monitor it may cause worsening of delirium use prn; trazodone for sleep prn   Code Status: full Family Communication: d/w husband, daughter at the bedsde (indicate person spoken with, relationship, and if by phone, the number) Disposition Plan: hoem when ready   Consultants:  surgery  Procedures:  SBO, hernia repair   Antibiotics:  None  (indicate start date, and stop date if known)  HPI/Subjective: alert  Objective: Filed Vitals:   06/11/13 0543  BP: 148/80  Pulse: 90  Temp: 98.6 F (37 C)  Resp: 18    Intake/Output Summary (Last 24 hours) at 06/11/13 1216 Last data filed at 06/11/13 0654  Gross per 24 hour  Intake 2403.34 ml  Output       0 ml  Net 2403.34 ml   Filed Weights   06/08/13 1854 06/09/13 0110 06/11/13 0602  Weight: 86.183 kg (190 lb) 87.227 kg (192 lb 4.8 oz) 90.039 kg (198 lb 8 oz)    Exam:   General:  alert  Cardiovascular: s1,s2 rrr  Respiratory: CTA BL  Abdomen: soft, mild tender,   Musculoskeletal: noEdema    Data Reviewed: Basic Metabolic Panel:  Recent Labs Lab 06/08/13 1919 06/09/13 0555 06/09/13 1600 06/10/13 0418 06/11/13 0500  NA 140 141  --  142 140  K 3.8 3.5  --  3.6 3.3*  CL 99 100  --  103 102  CO2 32 32  --  30 29  GLUCOSE 113* 115*  --  137* 115*  BUN 14 14  --  13 6  CREATININE 0.68 0.74 0.60 0.57 0.54  CALCIUM 9.5 9.4  --  8.7 8.6   Liver Function Tests:  Recent Labs Lab 06/09/13 0555  AST 24  ALT 8  ALKPHOS 65  BILITOT 0.6  PROT 7.0  ALBUMIN 3.4*   No results found for this basename: LIPASE, AMYLASE,  in the last 168 hours No results found for this basename: AMMONIA,  in the last 168 hours CBC:  Recent Labs Lab 06/08/13 1919 06/09/13 0555 06/09/13 1600 06/10/13 0418 06/11/13 0500  WBC 8.7 7.9 7.1 8.8 8.1  NEUTROABS 6.4  --   --   --   --   HGB 12.3 12.0 11.5* 10.9* 10.1*  HCT 37.7 37.8 36.1 33.9* 31.4*  MCV 95.2 95.9 96.0 95.2 94.9  PLT 244 244  229 228 208   Cardiac Enzymes: No results found for this basename: CKTOTAL, CKMB, CKMBINDEX, TROPONINI,  in the last 168 hours BNP (last 3 results) No results found for this basename: PROBNP,  in the last 8760 hours CBG:  Recent Labs Lab 06/09/13 0528 06/09/13 0919 06/09/13 2348 06/10/13 0512 06/10/13 1224  GLUCAP 123* 112* 115* 130* 109*    Recent Results (from the past 240 hour(s))  SURGICAL PCR SCREEN     Status: None   Collection Time    06/09/13  2:47 AM      Result Value Range Status   MRSA, PCR NEGATIVE  NEGATIVE Final   Staphylococcus aureus NEGATIVE  NEGATIVE Final   Comment:            The Xpert SA Assay (FDA     approved for NASAL specimens     in patients over 21 years  of age),     is one component of     a comprehensive surveillance     program.  Test performance has     been validated by [employer] for patients greater     than or equal to 11 year old.     It is not intended     to diagnose infection nor to     guide or monitor treatment.  URINE CULTURE     Status: None   Collection Time    06/09/13  5:33 AM      Result Value Range Status   Specimen Description URINE, CLEAN CATCH   Final   Special Requests Normal   Final   Culture  Setup Time     Final   Value: 06/09/2013 05:33     Performed at [facility] Count     Final   Value: NO GROWTH     Performed at [facility]   Culture     Final   Value: NO GROWTH     Performed at [facility]   Report Status 06/10/2013 FINAL   Final     Studies: No results found.  Scheduled Meds: . ALPRAZolam  0.5 mg Oral BID  . antiseptic oral rinse  15 mL Mouth Rinse q12n4p  . chlorhexidine  15 mL Mouth Rinse BID  . fluticasone  1 spray Each Nare Daily  . heparin  5,000 Units Subcutaneous Q8H  . levothyroxine  100 mcg Oral QAC breakfast  . [START ON 06/12/2013] pantoprazole  40 mg Oral Q1200  . polyethylene glycol  17 g Oral Daily  . tiotropium  18 mcg Inhalation Daily  . traMADol  50 mg Oral Q6H   Continuous Infusions: . dextrose 5 % and 0.45 % NaCl with KCl 20 mEq/L 100 mL/hr at 06/10/13 1553    Principal Problem:   SBO (small bowel obstruction) Active Problems:   HYPOTHYROIDISM NOS   COPD (chronic obstructive pulmonary disease)   HTN (hypertension)    Time spent: >35 minutes    Esperanza Sheets  [facility] Pager [phone]. If 7PM-7AM, please contact night-coverage at [email or web address], password [facility] 06/11/2013, 12:16 PM  LOS: 3 days

## 2013-06-11 NOTE — Progress Notes (Signed)
2 Days Post-Op  Subjective: Pt started to develop confusion in the evening and all night.  She states that she has not slept in 3 nights.  Able to tell me where she is at.  Recalls my name and remembers seeing me started.  States "the last time I got like this my husband said i had a urinary infection."  UA on 12/2 negative.  Labs this am stable.    Objective: Vital signs in last 24 hours: Temp:  [98.4 F (36.9 C)-98.9 F (37.2 C)] 98.6 F (37 C) (12/04 0543) Pulse Rate:  [76-90] 90 (12/04 0543) Resp:  [16-20] 18 (12/04 0543) BP: (105-148)/(65-81) 148/80 mmHg (12/04 0543) SpO2:  [93 %-98 %] 93 % (12/04 0543) Weight:  [198 lb 8 oz (90.039 kg)] 198 lb 8 oz (90.039 kg) (12/04 0602) Last BM Date: 06/08/13  Intake/Output from previous day: 12/03 0701 - 12/04 0700 In: 2403.3 [I.V.:2403.3] Out: -  Intake/Output this shift:    PE  General appearance: alert, cooperative and no distress  GI: bowel sounds are present, abdomen is soft and appropriately tender. incison is c/d/i.  There is minimal erythema around the incision, will need to monitor for developing wound infection  Lab Results:   Recent Labs  06/10/13 0418 06/11/13 0500  WBC 8.8 8.1  HGB 10.9* 10.1*  HCT 33.9* 31.4*  PLT 228 208   BMET  Recent Labs  06/10/13 0418 06/11/13 0500  NA 142 140  K 3.6 3.3*  CL 103 102  CO2 30 29  GLUCOSE 137* 115*  BUN 13 6  CREATININE 0.57 0.54  CALCIUM 8.7 8.6   PT/INR No results found for this basename: LABPROT, INR,  in the last 72 hours ABG No results found for this basename: PHART, PCO2, PO2, HCO3,  in the last 72 hours  Studies/Results: No results found.  Anti-infectives: Anti-infectives   Start     Dose/Rate Route Frequency Ordered Stop   06/09/13 1030  ceFAZolin (ANCEF) IVPB 2 g/50 mL premix  Status:  Discontinued     2 g 100 mL/hr over 30 Minutes Intravenous On call to O.R. 06/09/13 1016 06/09/13 1511   06/09/13 1018  ceFAZolin (ANCEF) 2-3 GM-% IVPB SOLR     Comments:  [name]   : cabinet override      06/09/13 1018 06/09/13 2229      Assessment/Plan: S/p Ex lap, reduction of incarcerated umbilical hernia, repair with mesh(Dr. Jamey Ripa 06/09/13)  POD#2 -new onset confusion.  I will add tramadol for pain management, DC morphine and reduce percocet in case it is medication related.  According to nursing and the patient she has not slept at all, recommend reconciling home medication and certainly addressing benzodiazepine as it should not be stopped abruptly.   -PT eval to aide with mobilization  -IS  -full liquid diet today -add miralax   LOS: 3 days    Nikolos Billig ANP-BC 06/11/2013 7:57 AM

## 2013-06-12 MED ORDER — ACETAMINOPHEN 325 MG PO TABS
650.0000 mg | ORAL_TABLET | Freq: Four times a day (QID) | ORAL | Status: DC | PRN
  Administered 2013-06-12 – 2013-06-15 (×3): 650 mg via ORAL
  Filled 2013-06-12 (×3): qty 2

## 2013-06-12 MED ORDER — TRAMADOL HCL 50 MG PO TABS
50.0000 mg | ORAL_TABLET | Freq: Four times a day (QID) | ORAL | Status: DC | PRN
  Administered 2013-06-12 – 2013-06-14 (×4): 50 mg via ORAL
  Filled 2013-06-12 (×4): qty 1

## 2013-06-12 NOTE — Progress Notes (Signed)
Physical Therapy Treatment Patient Details Name: [name] MRN: [id number] DOB: [birth year] Today's Date: 06/12/2013 Time: 3329-5188 PT Time Calculation (min): 27 min  PT Assessment / Plan / Recommendation  History of Present Illness s/p laparotmy; reducion of umblilical hernia    PT Comments   Pt making progress towards physical therapy goals. Pt continues to be slightly impulsive with movement, however did not demonstrate any LOB throughout session. Per nursing, pt is starting to come out of her post-op delirium. Could potentially do well with a SPC next session if continues to improve.   Follow Up Recommendations  Supervision/Assistance - 24 hour;Home health PT     Does the patient have the potential to tolerate intense rehabilitation     Barriers to Discharge        Equipment Recommendations  None recommended by PT    Recommendations for Other Services    Frequency Min 3X/week   Progress towards PT Goals Progress towards PT goals: Progressing toward goals  Plan Current plan remains appropriate    Precautions / Restrictions Precautions Precautions: Fall Precaution Comments: had a recent fall over oxygen cord; pt is on 3L O2  Restrictions Weight Bearing Restrictions: No   Pertinent Vitals/Pain Pt reports minimal pain throughout session.     Mobility  Bed Mobility Bed Mobility: Supine to Sit;Sitting - Scoot to Edge of Bed Supine to Sit: 4: Min guard;5: Supervision Sitting - Scoot to [place] of Bed: 5: Supervision Details for Bed Mobility Assistance: VC's for sequencing and technique. Pt was able to transition to EOB without assist, however min guard required briefly once sitting up due to balance.  Transfers Transfers: Sit to Stand;Stand to Sit Sit to Stand: 4: Min guard;From bed;With upper extremity assist Stand to Sit: 5: Supervision;To chair/3-in-1;With upper extremity assist Details for Transfer Assistance: VC's for hand placement on seated surface. Pt did not make  corrective changes and kept hands on walker, however she was able to come to standing without assist.  Ambulation/Gait Ambulation/Gait Assistance: 4: Min guard Ambulation Distance (Feet): 150 Feet Assistive device: Rolling walker Ambulation/Gait Assistance Details: VC's for walker position closer to pt's body. On 3L/min supplemental O2 throughout gait training.  Gait Pattern: Step-through pattern;Decreased stride length Gait velocity: inconsistent    Exercises     PT Diagnosis:    PT Problem List:   PT Treatment Interventions:     PT Goals (current goals can now be found in the care plan section) Acute Rehab PT Goals Patient Stated Goal: to not have to go to rehab PT Goal Formulation: With patient Time For Goal Achievement: 06/17/13 Potential to Achieve Goals: Good  Visit Information  Last PT Received On: 06/12/13 Assistance Needed: +1 History of Present Illness: s/p laparotmy; reducion of umblilical hernia     Subjective Data  Subjective: "I'm trying to take a nap." Patient Stated Goal: to not have to go to rehab   Cognition  Cognition Arousal/Alertness: Awake/alert Behavior During Therapy: [facility] for tasks assessed/performed;Impulsive (slightly) Overall Cognitive Status: Impaired/Different from baseline Area of Impairment: Safety/judgement Safety/Judgement: Decreased awareness of safety;Decreased awareness of deficits    Balance  Balance Balance Assessed: Yes Static Sitting Balance Static Sitting - Balance Support: Feet supported;Bilateral upper extremity supported Static Sitting - Level of Assistance: 5: Stand by assistance Static Standing Balance Static Standing - Balance Support: Right upper extremity supported Static Standing - Level of Assistance: 5: Stand by assistance  End of Session PT - End of Session Equipment Utilized During Treatment: Gait belt;Oxygen Activity  Tolerance: Patient tolerated treatment well Patient left: in chair;with call bell/phone within  reach;with family/visitor present Nurse Communication: Mobility status   GP     Ruthann Cancer 06/12/2013, 2:47 PM  Ruthann Cancer, PT, DPT [phone]

## 2013-06-12 NOTE — Clinical Social Work Note (Signed)
CSW attempted to speak with family regarding discharge disposition for when pt is medically stable to discharge from [facility]. CSW could not reach either pt's husband or daughter. CSW left handoff for weekend CSW to follow-up with family.  Darlyn Chamber, LCSWA Clinical Social Worker [phone]

## 2013-06-12 NOTE — Progress Notes (Signed)
3 Days Post-Op   Assessment: s/p Procedure(s): EXPLORATIORY LAPAROTOMY, HERNIA REPAIR UMBILICAL, INSERTION OF MESH Patient Active Problem List   Diagnosis Date Noted  . COPD (chronic obstructive pulmonary disease) 06/09/2013  . HTN (hypertension) 06/09/2013  . SBO (small bowel obstruction) 06/08/2013  . Chronic respiratory failure 05/13/2013  . Cough 05/02/2013  . Other malaise and fatigue 12/31/2012  . Vertigo 12/31/2012  . Osteoarthritis of right knee 08/27/2011  . Gold C Copd with asthmatic bronchitis component and chronic resp failure 06/26/2011  . Neck pain 04/02/2011  . Onychomycosis 01/31/2011  . RISK OF FALLING 08/17/2010  . UNSPECIFIED URINARY INCONTINENCE 01/12/2010  . HYPOPOTASSEMIA 05/24/2009  . ANAL FISSURE 03/24/2009  . ANEMIA, MILD 04/10/2008  . URINARY TRACT INFECTION, ACUTE, RECURRENT 04/10/2008  . ANXIETY DISORDER 03/01/2008  . OSTEOPOROSIS 06/23/2007  . Seasonal and perennial allergic rhinitis 04/15/2007  . PAIN, CHRONIC NEC 12/26/2006  . HYPOTHYROIDISM NOS 12/24/2006  . HYPERLIPIDEMIA 12/24/2006  . COMMON MIGRAINE 12/24/2006  . HYPERTENSION 12/24/2006  . GERD 12/24/2006  . DIVERTICULOSIS, COLON 12/24/2006  . DEGENERATIVE DISC DISEASE 12/24/2006  . FIBROMYALGIA 12/24/2006  . INSOMNIA 12/24/2006  . HYPERGLYCEMIA 12/24/2006    Stable surgically  Plan: Advance diet  Subjective: Still not sleepinig per sitter this am. No nausea and would like more to eat.   Objective: Vital signs in last 24 hours: Temp:  [98.5 F (36.9 C)-99 F (37.2 C)] 98.5 F (36.9 C) (12/05 0630) Pulse Rate:  [81-91] 81 (12/05 0630) Resp:  [19] 19 (12/05 0630) BP: (135-144)/(55-96) 144/96 mmHg (12/05 0630) SpO2:  [91 %-98 %] 98 % (12/05 0630)   Intake/Output from previous day: 12/04 0701 - 12/05 0700 In: 3000 [P.O.:690; I.V.:2310] Out: 850 [Urine:850]  General appearance: alert, cooperative and no distress GI: Soft, mildly tender, but no rebound, BS+   Incision:  Wound clean, but some   Lab Results:   Recent Labs  06/10/13 0418 06/11/13 0500  WBC 8.8 8.1  HGB 10.9* 10.1*  HCT 33.9* 31.4*  PLT 228 208   BMET  Recent Labs  06/10/13 0418 06/11/13 0500  NA 142 140  K 3.6 3.3*  CL 103 102  CO2 30 29  GLUCOSE 137* 115*  BUN 13 6  CREATININE 0.57 0.54  CALCIUM 8.7 8.6    MEDS, Scheduled . antiseptic oral rinse  15 mL Mouth Rinse q12n4p  . chlorhexidine  15 mL Mouth Rinse BID  . fluticasone  1 spray Each Nare Daily  . heparin  5,000 Units Subcutaneous Q8H  . levothyroxine  100 mcg Oral QAC breakfast  . pantoprazole  40 mg Oral Q1200  . polyethylene glycol  17 g Oral Daily  . tiotropium  18 mcg Inhalation Daily  . traMADol  50 mg Oral Q6H    Studies/Results: No results found.    LOS: 4 days     Currie Paris, MD, [facility], [place] [phone]   06/12/2013 8:03 AM

## 2013-06-12 NOTE — Progress Notes (Signed)
[facility] PROGRESS NOTE  [name] XLK:[id number] DOB: 04/20/1931 DOA: 06/08/2013 PCP: Crawford Givens, MD  Assessment/Plan: 79 y.o. female with history of hypertension, COPD, hypothyroidism presented to the ER because of sudden onset of abdominal pain radiating to the back since last evening. CT abdomen and pelvis was done in the ER which shows features consistent with small bowel obstruction with transition point at small bowel containing paraumblical hernia  1. SBO s/p Ex lap, reduction of incarcerated umbilical hernia, repair with mesh(Dr. Jamey Ripa 06/09/13) -improving; cont management per surgery   2. 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 Darrick Penna vascular surgeon who has advised no acute intervention at this time.  3. Hypertension , resume amlodipine; IV hydralazine for systolic blood pressure more than 160  4. Hypothyroidism; cont levothyroxine;   5. COPD/chronic resp failure on home oxygen; no s/s of acute exacerbation;'  -cont home regimen   6. Encephalopathy, delirium likely med related, opioids vs benzo given last night; neuro exam no focal; no s/s of infection; clinically better today;  -improving; d/c narcotics, started tramadol prn only, resumed xanax prn close monitor it may cause worsening of delirium; trazodone for sleep prn   PT: SNF c/s SW for assistance   Code Status: full Family Communication: d/w husband, daughter at the bedsde (indicate person spoken with, relationship, and if by phone, the number) Disposition Plan: hoem when ready   Consultants:  surgery  Procedures:  SBO, hernia repair   Antibiotics:  None  (indicate start date, and stop date if known)  HPI/Subjective: alert  Objective: Filed Vitals:   06/12/13 0630  BP: 144/96  Pulse: 81  Temp: 98.5 F (36.9 C)  Resp: 19    Intake/Output Summary (Last 24 hours) at 06/12/13 1003 Last data filed at 06/12/13 0900  Gross per  24 hour  Intake   3120 ml  Output    675 ml  Net   2445 ml   Filed Weights   06/08/13 1854 06/09/13 0110 06/11/13 0602  Weight: 86.183 kg (190 lb) 87.227 kg (192 lb 4.8 oz) 90.039 kg (198 lb 8 oz)    Exam:   General:  alert  Cardiovascular: s1,s2 rrr  Respiratory: CTA BL  Abdomen: soft, mild tender,   Musculoskeletal: noEdema    Data Reviewed: Basic Metabolic Panel:  Recent Labs Lab 06/08/13 1919 06/09/13 0555 06/09/13 1600 06/10/13 0418 06/11/13 0500  NA 140 141  --  142 140  K 3.8 3.5  --  3.6 3.3*  CL 99 100  --  103 102  CO2 32 32  --  30 29  GLUCOSE 113* 115*  --  137* 115*  BUN 14 14  --  13 6  CREATININE 0.68 0.74 0.60 0.57 0.54  CALCIUM 9.5 9.4  --  8.7 8.6   Liver Function Tests:  Recent Labs Lab 06/09/13 0555  AST 24  ALT 8  ALKPHOS 65  BILITOT 0.6  PROT 7.0  ALBUMIN 3.4*   No results found for this basename: LIPASE, AMYLASE,  in the last 168 hours No results found for this basename: AMMONIA,  in the last 168 hours CBC:  Recent Labs Lab 06/08/13 1919 06/09/13 0555 06/09/13 1600 06/10/13 0418 06/11/13 0500  WBC 8.7 7.9 7.1 8.8 8.1  NEUTROABS 6.4  --   --   --   --   HGB 12.3 12.0 11.5* 10.9* 10.1*  HCT 37.7 37.8 36.1 33.9* 31.4*  MCV  95.2 95.9 96.0 95.2 94.9  PLT 244 244 229 228 208   Cardiac Enzymes: No results found for this basename: CKTOTAL, CKMB, CKMBINDEX, TROPONINI,  in the last 168 hours BNP (last 3 results) No results found for this basename: PROBNP,  in the last 8760 hours CBG:  Recent Labs Lab 06/09/13 0528 06/09/13 0919 06/09/13 2348 06/10/13 0512 06/10/13 1224  GLUCAP 123* 112* 115* 130* 109*    Recent Results (from the past 240 hour(s))  SURGICAL PCR SCREEN     Status: None   Collection Time    06/09/13  2:47 AM      Result Value Range Status   MRSA, PCR NEGATIVE  NEGATIVE Final   Staphylococcus aureus NEGATIVE  NEGATIVE Final   Comment:            The Xpert SA Assay (FDA     approved for NASAL  specimens     in patients over 98 years of age),     is one component of     a comprehensive surveillance     program.  Test performance has     been validated by [employer] for patients greater     than or equal to 99 year old.     It is not intended     to diagnose infection nor to     guide or monitor treatment.  URINE CULTURE     Status: None   Collection Time    06/09/13  5:33 AM      Result Value Range Status   Specimen Description URINE, CLEAN CATCH   Final   Special Requests Normal   Final   Culture  Setup Time     Final   Value: 06/09/2013 05:33     Performed at [facility] Count     Final   Value: NO GROWTH     Performed at [facility]   Culture     Final   Value: NO GROWTH     Performed at [facility]   Report Status 06/10/2013 FINAL   Final     Studies: No results found.  Scheduled Meds: . antiseptic oral rinse  15 mL Mouth Rinse q12n4p  . chlorhexidine  15 mL Mouth Rinse BID  . fluticasone  1 spray Each Nare Daily  . heparin  5,000 Units Subcutaneous Q8H  . levothyroxine  100 mcg Oral QAC breakfast  . pantoprazole  40 mg Oral Q1200  . polyethylene glycol  17 g Oral Daily  . tiotropium  18 mcg Inhalation Daily  . traMADol  50 mg Oral Q6H   Continuous Infusions: . dextrose 5 % and 0.45 % NaCl with KCl 20 mEq/L 100 mL/hr at 06/12/13 0010    Principal Problem:   SBO (small bowel obstruction) Active Problems:   HYPOTHYROIDISM NOS   COPD (chronic obstructive pulmonary disease)   HTN (hypertension)    Time spent: >35 minutes    Esperanza Sheets  [facility] Pager [phone]. If 7PM-7AM, please contact night-coverage at [email or web address], password [facility] 06/12/2013, 10:03 AM  LOS: 4 days

## 2013-06-13 ENCOUNTER — Inpatient Hospital Stay (HOSPITAL_COMMUNITY): Payer: Medicare Other

## 2013-06-13 MED ORDER — OLANZAPINE 5 MG PO TBDP
5.0000 mg | ORAL_TABLET | Freq: Every day | ORAL | Status: DC
  Administered 2013-06-13 – 2013-06-14 (×2): 5 mg via ORAL
  Filled 2013-06-13 (×4): qty 1

## 2013-06-13 NOTE — Progress Notes (Signed)
4 Days Post-Op  Subjective: Still confused, daughter says that she has not slept. Tolerating diet.  Says that pain is improving.  Objective: Vital signs in last 24 hours: Temp:  [98 F (36.7 C)-98.5 F (36.9 C)] 98 F (36.7 C) (12/06 0520) Pulse Rate:  [78-92] 92 (12/06 0520) Resp:  [18-20] 18 (12/06 0520) BP: (152-154)/(75-91) 152/91 mmHg (12/06 0520) SpO2:  [94 %-100 %] 94 % (12/06 0520) Weight:  [196 lb 13.9 oz (89.3 kg)] 196 lb 13.9 oz (89.3 kg) (12/06 0500) Last BM Date: 06/08/13  Intake/Output from previous day: 12/05 0701 - 12/06 0700 In: 2104.2 [P.O.:730; I.V.:1374.2] Out: 25 [Urine:25] Intake/Output this shift:    General appearance: alert, cooperative, no distress and confused, with random thoughts GI: soft, mild incisional tenderness, ND, wound with slight erythema around the wound.    Lab Results:   Recent Labs  06/11/13 0500  WBC 8.1  HGB 10.1*  HCT 31.4*  PLT 208   BMET  Recent Labs  06/11/13 0500  NA 140  K 3.3*  CL 102  CO2 29  GLUCOSE 115*  BUN 6  CREATININE 0.54  CALCIUM 8.6   PT/INR No results found for this basename: LABPROT, INR,  in the last 72 hours ABG No results found for this basename: PHART, PCO2, PO2, HCO3,  in the last 72 hours  Studies/Results: No results found.  Anti-infectives: Anti-infectives   Start     Dose/Rate Route Frequency Ordered Stop   06/09/13 1030  ceFAZolin (ANCEF) IVPB 2 g/50 mL premix  Status:  Discontinued     2 g 100 mL/hr over 30 Minutes Intravenous On call to O.R. 06/09/13 1016 06/09/13 1511   06/09/13 1018  ceFAZolin (ANCEF) 2-3 GM-% IVPB SOLR    Comments:  [name]   : cabinet override      06/09/13 1018 06/09/13 2229      Assessment/Plan: s/p Procedure(s): EXPLORATIORY LAPAROTOMY, HERNIA REPAIR UMBILICAL, INSERTION OF MESH (N/A) Slight erythema around wound but otherwise seems to be doing okay.  Having confusion and lack of sleep, which is being addressed by hospitalist.  DIet as  tolerated, dispo planning for when confusion resolves.  will watch the wound today and see if any progression of erythema, may need antibiotics or opening the wound  LOS: 5 days    [name] [name] 06/13/2013

## 2013-06-13 NOTE — Progress Notes (Addendum)
Pt  Slept maybe an hour last night. Had an  episode of hallucination early this Am. Called 911 telling operator that she saw 2 guys and they will try to harm her. Redirect  And reoriented pt. Safety sitter at bedside.

## 2013-06-13 NOTE — Progress Notes (Signed)
Pt finally sleeping.

## 2013-06-13 NOTE — Progress Notes (Signed)
Obtained lavendar on cotton balls to help relax pt, at bedside.

## 2013-06-13 NOTE — Progress Notes (Signed)
[facility] PROGRESS NOTE  [name]:[id number] DOB: 03/22/1931 DOA: 06/08/2013 PCP: Crawford Givens, MD  Assessment/Plan: 79 y.o. female with history of hypertension, COPD, hypothyroidism presented to the ER because of sudden onset of abdominal pain radiating to the back since last evening. CT abdomen and pelvis was done in the ER which shows features consistent with small bowel obstruction with transition point at small bowel containing paraumblical hernia  1. SBO s/p Ex lap, reduction of incarcerated umbilical hernia, repair with mesh(Dr. Jamey Ripa 06/09/13) -improving; cont management per surgery   2. 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 Darrick Penna vascular surgeon who has advised no acute intervention at this time.  3. Hypertension , resume amlodipine; IV hydralazine for systolic blood pressure more than 160  4. Hypothyroidism; cont levothyroxine;   5. COPD/chronic resp failure on home oxygen; no s/s of acute exacerbation;'  -cont home regimen   6. Encephalopathy, delirium likely med related, opioids vs benzo; neuro exam no focal; no s/s of infection;  -obtain CT head; d/c narcotics, started tramadol prn only, resumed xanax prevent withdrawals;  trazodone for sleep prn; zyprexa QHS  PT: SNF c/s SW for assistance   Code Status: full Family Communication: d/w husband, daughter at the bedsde (indicate person spoken with, relationship, and if by phone, the number) Disposition Plan: hoem when ready   Consultants:  surgery  Procedures:  SBO, hernia repair   Antibiotics:  None  (indicate start date, and stop date if known)  HPI/Subjective: alert  Objective: Filed Vitals:   06/13/13 0520  BP: 152/91  Pulse: 92  Temp: 98 F (36.7 C)  Resp: 18    Intake/Output Summary (Last 24 hours) at 06/13/13 1106 Last data filed at 06/13/13 0649  Gross per 24 hour  Intake 1984.17 ml  Output      0 ml  Net  1984.17 ml   Filed Weights   06/09/13 0110 06/11/13 0602 06/13/13 0500  Weight: 87.227 kg (192 lb 4.8 oz) 90.039 kg (198 lb 8 oz) 89.3 kg (196 lb 13.9 oz)    Exam:   General:  alert  Cardiovascular: s1,s2 rrr  Respiratory: CTA BL  Abdomen: soft, mild tender,   Musculoskeletal: noEdema    Data Reviewed: Basic Metabolic Panel:  Recent Labs Lab 06/08/13 1919 06/09/13 0555 06/09/13 1600 06/10/13 0418 06/11/13 0500  NA 140 141  --  142 140  K 3.8 3.5  --  3.6 3.3*  CL 99 100  --  103 102  CO2 32 32  --  30 29  GLUCOSE 113* 115*  --  137* 115*  BUN 14 14  --  13 6  CREATININE 0.68 0.74 0.60 0.57 0.54  CALCIUM 9.5 9.4  --  8.7 8.6   Liver Function Tests:  Recent Labs Lab 06/09/13 0555  AST 24  ALT 8  ALKPHOS 65  BILITOT 0.6  PROT 7.0  ALBUMIN 3.4*   No results found for this basename: LIPASE, AMYLASE,  in the last 168 hours No results found for this basename: AMMONIA,  in the last 168 hours CBC:  Recent Labs Lab 06/08/13 1919 06/09/13 0555 06/09/13 1600 06/10/13 0418 06/11/13 0500  WBC 8.7 7.9 7.1 8.8 8.1  NEUTROABS 6.4  --   --   --   --   HGB 12.3 12.0 11.5* 10.9* 10.1*  HCT 37.7 37.8 36.1 33.9* 31.4*  MCV 95.2 95.9 96.0 95.2 94.9  PLT 244 244  229 228 208   Cardiac Enzymes: No results found for this basename: CKTOTAL, CKMB, CKMBINDEX, TROPONINI,  in the last 168 hours BNP (last 3 results) No results found for this basename: PROBNP,  in the last 8760 hours CBG:  Recent Labs Lab 06/09/13 0528 06/09/13 0919 06/09/13 2348 06/10/13 0512 06/10/13 1224  GLUCAP 123* 112* 115* 130* 109*    Recent Results (from the past 240 hour(s))  SURGICAL PCR SCREEN     Status: None   Collection Time    06/09/13  2:47 AM      Result Value Range Status   MRSA, PCR NEGATIVE  NEGATIVE Final   Staphylococcus aureus NEGATIVE  NEGATIVE Final   Comment:            The Xpert SA Assay (FDA     approved for NASAL specimens     in patients over 21 years of  age),     is one component of     a comprehensive surveillance     program.  Test performance has     been validated by [employer] for patients greater     than or equal to 34 year old.     It is not intended     to diagnose infection nor to     guide or monitor treatment.  URINE CULTURE     Status: None   Collection Time    06/09/13  5:33 AM      Result Value Range Status   Specimen Description URINE, CLEAN CATCH   Final   Special Requests Normal   Final   Culture  Setup Time     Final   Value: 06/09/2013 05:33     Performed at [facility] Count     Final   Value: NO GROWTH     Performed at [facility]   Culture     Final   Value: NO GROWTH     Performed at [facility]   Report Status 06/10/2013 FINAL   Final     Studies: No results found.  Scheduled Meds: . antiseptic oral rinse  15 mL Mouth Rinse q12n4p  . chlorhexidine  15 mL Mouth Rinse BID  . fluticasone  1 spray Each Nare Daily  . heparin  5,000 Units Subcutaneous Q8H  . levothyroxine  100 mcg Oral QAC breakfast  . pantoprazole  40 mg Oral Q1200  . polyethylene glycol  17 g Oral Daily  . tiotropium  18 mcg Inhalation Daily   Continuous Infusions: . dextrose 5 % and 0.45 % NaCl with KCl 20 mEq/L 50 mL/hr at 06/13/13 0706    Principal Problem:   SBO (small bowel obstruction) Active Problems:   HYPOTHYROIDISM NOS   COPD (chronic obstructive pulmonary disease)   HTN (hypertension)    Time spent: >35 minutes    Esperanza Sheets  [facility] Pager [phone]. If 7PM-7AM, please contact night-coverage at [email or web address], password [facility] 06/13/2013, 11:06 AM  LOS: 5 days

## 2013-06-14 MED ORDER — LORATADINE 10 MG PO TABS
10.0000 mg | ORAL_TABLET | Freq: Every day | ORAL | Status: DC
  Administered 2013-06-14 – 2013-06-15 (×2): 10 mg via ORAL
  Filled 2013-06-14 (×2): qty 1

## 2013-06-14 NOTE — Progress Notes (Signed)
5 Days Post-Op  Subjective: Tolerating diet.  +BM  Objective: Vital signs in last 24 hours: Temp:  [97.9 F (36.6 C)-99.1 F (37.3 C)] 97.9 F (36.6 C) (12/07 0503) Pulse Rate:  [86-99] 86 (12/07 0503) Resp:  [18] 18 (12/07 0503) BP: (126-135)/(83-86) 135/86 mmHg (12/07 0503) SpO2:  [94 %-95 %] 95 % (12/07 0503) Weight:  [186 lb 15.2 oz (84.8 kg)] 186 lb 15.2 oz (84.8 kg) (12/07 0708) Last BM Date: 06/14/13  Intake/Output from previous day:   Intake/Output this shift:    General appearance: alert, cooperative and no distress GI: soft, appropriate incisional tenderness, ND, incision looks good, no perioteal signs  Lab Results:  No results found for this basename: WBC, HGB, HCT, PLT,  in the last 72 hours BMET No results found for this basename: NA, K, CL, CO2, GLUCOSE, BUN, CREATININE, CALCIUM,  in the last 72 hours PT/INR No results found for this basename: LABPROT, INR,  in the last 72 hours ABG No results found for this basename: PHART, PCO2, PO2, HCO3,  in the last 72 hours  Studies/Results: Ct Head Wo Contrast  06/13/2013   CLINICAL DATA:  Acute altered mental status after anesthesia.  EXAM: CT HEAD WITHOUT CONTRAST  TECHNIQUE: Contiguous axial images were obtained from the base of the skull through the vertex without intravenous contrast.  COMPARISON:  CT scan dated 12/17/2009  FINDINGS: No mass lesion. No midline shift. No acute hemorrhage or hematoma. No extra-axial fluid collections. No evidence of acute infarction. The patient has fairly severe cerebral cortical and cerebellar atrophy, progressed since the prior study. Secondary ventricular dilatation. Extensive periventricular white matter lucency, progressed since the prior study with old lacunar infarcts in the basal ganglia.  Chronic changes in paranasal sinuses.  No acute osseous abnormality.  IMPRESSION: No acute intracranial abnormality. Fairly severe atrophy and chronic small vessel ischemic disease.    Electronically Signed   By: Geanie Cooley M.D.   On: 06/13/2013 14:47    Anti-infectives: Anti-infectives   Start     Dose/Rate Route Frequency Ordered Stop   06/09/13 1030  ceFAZolin (ANCEF) IVPB 2 g/50 mL premix  Status:  Discontinued     2 g 100 mL/hr over 30 Minutes Intravenous On call to O.R. 06/09/13 1016 06/09/13 1511   06/09/13 1018  ceFAZolin (ANCEF) 2-3 GM-% IVPB SOLR    Comments:  [name]   : cabinet override      06/09/13 1018 06/09/13 2229      Assessment/Plan: s/p Procedure(s): EXPLORATIORY LAPAROTOMY, HERNIA REPAIR UMBILICAL, INSERTION OF MESH (N/A) she still has some underlying confusion/dementia/delirium but her abdomen seems to be doing okay.  continue with diet as tolerated.  no evidence of postop complications.  she should be okay for transfer to SNF when available.  she will need to follow up with Dr. Jamey Ripa in 7-10 days for staple removal  LOS: 6 days    [name] [name] 06/14/2013

## 2013-06-14 NOTE — Progress Notes (Signed)
[facility] PROGRESS NOTE  [name] ZOX:[id number] DOB: 04-Dec-1930 DOA: 06/08/2013 PCP: Crawford Givens, MD  Assessment/Plan: 79 y.o. female with history of hypertension, COPD, hypothyroidism presented to the ER because of sudden onset of abdominal pain radiating to the back since last evening. CT abdomen and pelvis was done in the ER which shows features consistent with small bowel obstruction with transition point at small bowel containing paraumblical hernia  1. SBO s/p Ex lap, reduction of incarcerated umbilical hernia, repair with mesh(Dr. Jamey Ripa 06/09/13) -resolved; cont management per surgery   2. 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 Darrick Penna vascular surgeon who has advised no acute intervention at this time.  3. Hypertension , resume amlodipine; IV hydralazine for systolic blood pressure more than 160  4. Hypothyroidism; cont levothyroxine;   5. COPD/chronic resp failure on home oxygen; no s/s of acute exacerbation;'  -cont home regimen   6. Encephalopathy, delirium likely med related, opioids vs benzo; neuro exam no focal; no s/s of infection;  -clinically improved; likely underlying dementia;  CT head: sever brain atrophy; d/c narcotics, started tramadol prn only, resumed xanax prevent withdrawals;  trazodone for sleep prn; zyprexa QHS -d/w patient, family agreed with plan of care   PT: SNF c/s SW for assistance vs HHC   Code Status: full Family Communication: d/w husband, daughter at the bedsde (indicate person spoken with, relationship, and if by phone, the number) Disposition Plan: SNF, VS HHC likely on Monday    Consultants:  surgery  Procedures:  SBO, hernia repair   Antibiotics:  None  (indicate start date, and stop date if known)  HPI/Subjective: alert  Objective: Filed Vitals:   06/14/13 0503  BP: 135/86  Pulse: 86  Temp: 97.9 F (36.6 C)  Resp: 18   No intake or output data in  the 24 hours ending 06/14/13 1046 Filed Weights   06/11/13 0602 06/13/13 0500 06/14/13 0708  Weight: 90.039 kg (198 lb 8 oz) 89.3 kg (196 lb 13.9 oz) 84.8 kg (186 lb 15.2 oz)    Exam:   General:  alert  Cardiovascular: s1,s2 rrr  Respiratory: CTA BL  Abdomen: soft, mild tender,   Musculoskeletal: noEdema    Data Reviewed: Basic Metabolic Panel:  Recent Labs Lab 06/08/13 1919 06/09/13 0555 06/09/13 1600 06/10/13 0418 06/11/13 0500  NA 140 141  --  142 140  K 3.8 3.5  --  3.6 3.3*  CL 99 100  --  103 102  CO2 32 32  --  30 29  GLUCOSE 113* 115*  --  137* 115*  BUN 14 14  --  13 6  CREATININE 0.68 0.74 0.60 0.57 0.54  CALCIUM 9.5 9.4  --  8.7 8.6   Liver Function Tests:  Recent Labs Lab 06/09/13 0555  AST 24  ALT 8  ALKPHOS 65  BILITOT 0.6  PROT 7.0  ALBUMIN 3.4*   No results found for this basename: LIPASE, AMYLASE,  in the last 168 hours No results found for this basename: AMMONIA,  in the last 168 hours CBC:  Recent Labs Lab 06/08/13 1919 06/09/13 0555 06/09/13 1600 06/10/13 0418 06/11/13 0500  WBC 8.7 7.9 7.1 8.8 8.1  NEUTROABS 6.4  --   --   --   --   HGB 12.3 12.0 11.5* 10.9* 10.1*  HCT 37.7 37.8 36.1 33.9* 31.4*  MCV 95.2 95.9 96.0 95.2 94.9  PLT 244 244 229 228 208  Cardiac Enzymes: No results found for this basename: CKTOTAL, CKMB, CKMBINDEX, TROPONINI,  in the last 168 hours BNP (last 3 results) No results found for this basename: PROBNP,  in the last 8760 hours CBG:  Recent Labs Lab 06/09/13 0528 06/09/13 0919 06/09/13 2348 06/10/13 0512 06/10/13 1224  GLUCAP 123* 112* 115* 130* 109*    Recent Results (from the past 240 hour(s))  SURGICAL PCR SCREEN     Status: None   Collection Time    06/09/13  2:47 AM      Result Value Range Status   MRSA, PCR NEGATIVE  NEGATIVE Final   Staphylococcus aureus NEGATIVE  NEGATIVE Final   Comment:            The Xpert SA Assay (FDA     approved for NASAL specimens     in patients  over 40 years of age),     is one component of     a comprehensive surveillance     program.  Test performance has     been validated by [employer] for patients greater     than or equal to 19 year old.     It is not intended     to diagnose infection nor to     guide or monitor treatment.  URINE CULTURE     Status: None   Collection Time    06/09/13  5:33 AM      Result Value Range Status   Specimen Description URINE, CLEAN CATCH   Final   Special Requests Normal   Final   Culture  Setup Time     Final   Value: 06/09/2013 05:33     Performed at [facility] Count     Final   Value: NO GROWTH     Performed at [facility]   Culture     Final   Value: NO GROWTH     Performed at [facility]   Report Status 06/10/2013 FINAL   Final     Studies: Ct Head Wo Contrast  06/13/2013   CLINICAL DATA:  Acute altered mental status after anesthesia.  EXAM: CT HEAD WITHOUT CONTRAST  TECHNIQUE: Contiguous axial images were obtained from the base of the skull through the vertex without intravenous contrast.  COMPARISON:  CT scan dated 12/17/2009  FINDINGS: No mass lesion. No midline shift. No acute hemorrhage or hematoma. No extra-axial fluid collections. No evidence of acute infarction. The patient has fairly severe cerebral cortical and cerebellar atrophy, progressed since the prior study. Secondary ventricular dilatation. Extensive periventricular white matter lucency, progressed since the prior study with old lacunar infarcts in the basal ganglia.  Chronic changes in paranasal sinuses.  No acute osseous abnormality.  IMPRESSION: No acute intracranial abnormality. Fairly severe atrophy and chronic small vessel ischemic disease.   Electronically Signed   By: Geanie Cooley M.D.   On: 06/13/2013 14:47    Scheduled Meds: . fluticasone  1 spray Each Nare Daily  . heparin  5,000 Units Subcutaneous Q8H  . levothyroxine  100 mcg Oral QAC breakfast  . OLANZapine  zydis  5 mg Oral QHS  . pantoprazole  40 mg Oral Q1200  . polyethylene glycol  17 g Oral Daily  . tiotropium  18 mcg Inhalation Daily   Continuous Infusions:    Principal Problem:   SBO (small bowel obstruction) Active Problems:   HYPOTHYROIDISM NOS   COPD (chronic obstructive pulmonary  disease)   HTN (hypertension)    Time spent: >35 minutes    Esperanza Sheets  [facility] Pager [phone]. If 7PM-7AM, please contact night-coverage at [email or web address], password [facility] 06/14/2013, 10:46 AM  LOS: 6 days

## 2013-06-14 NOTE — Progress Notes (Signed)
Clinical Social Worker (CSW) met with family and patient to discuss SNF placement. Patient and family want home health services. CSW left message for case manager making her aware of above. Please reconsult if further social work needs arise. CSW signing off.   Jetta Lout, LCSWA Weekend CSW [phone]

## 2013-06-14 NOTE — Progress Notes (Signed)
Patient and family discussing discharge options for patient.  Daughter, Sharl Ma, and husband do not wish her to go to a re-hab facility.  They want information on what she may need for physical / occupational therapy.  Patient was adamant re:  staying at home.  She lives with her husband, who is not in good health.  They are concerned re: how much available help she would be able to receive at home.

## 2013-06-15 MED ORDER — LEVOTHYROXINE SODIUM 112 MCG PO TABS: 112.0000 ug | ORAL_TABLET | Freq: Every day | ORAL | Status: DC

## 2013-06-15 MED ORDER — TIOTROPIUM BROMIDE MONOHYDRATE 18 MCG IN CAPS: 18.0000 ug | ORAL_CAPSULE | Freq: Every day | RESPIRATORY_TRACT | Status: DC

## 2013-06-15 MED ORDER — FLUTICASONE-SALMETEROL 230-21 MCG/ACT IN AERO: 2.0000 | INHALATION_SPRAY | Freq: Two times a day (BID) | RESPIRATORY_TRACT | Status: DC

## 2013-06-15 MED ORDER — TRAZODONE HCL 50 MG PO TABS: 50.0000 mg | ORAL_TABLET | Freq: Every evening | ORAL | Status: DC | PRN

## 2013-06-15 MED ORDER — ACETAMINOPHEN 325 MG PO TABS: 650.0000 mg | ORAL_TABLET | Freq: Four times a day (QID) | ORAL | Status: DC | PRN

## 2013-06-15 MED ORDER — AMLODIPINE BESYLATE 2.5 MG PO TABS: 2.5000 mg | ORAL_TABLET | Freq: Every day | ORAL | Status: DC

## 2013-06-15 MED ORDER — DULOXETINE HCL 60 MG PO CPEP: 60.0000 mg | ORAL_CAPSULE | Freq: Every day | ORAL | Status: DC

## 2013-06-15 NOTE — Progress Notes (Signed)
General Surgery [facility]  Plans for discharge to SNF today noted.  Follow up arranged at [facility] office with Dr. Jamey Ripa.  Velora Heckler, MD, [facility] Office: [phone]

## 2013-06-15 NOTE — Discharge Planning (Signed)
Patient discharged home in stable condition. Verbalizes understanding of all discharge instructions, including home medications and follow up appointments. 

## 2013-06-15 NOTE — Progress Notes (Signed)
6 Days Post-Op  Subjective: Pt appears alert and oriented, conversant.  Had a BM this AM.  Tolerating diet.  Ambulating.   Objective: Vital signs in last 24 hours: Temp:  [98 F (36.7 C)-98.8 F (37.1 C)] 98 F (36.7 C) (12/08 0553) Pulse Rate:  [75-80] 75 (12/08 0553) Resp:  [17-20] 17 (12/08 0553) BP: (95-144)/(61-77) 144/62 mmHg (12/08 0553) SpO2:  [96 %-100 %] 96 % (12/08 0553) Last BM Date: 06/14/13  Intake/Output from previous day:   Intake/Output this shift:    Studies/Results: Ct Head Wo Contrast  06/13/2013   CLINICAL DATA:  Acute altered mental status after anesthesia.  EXAM: CT HEAD WITHOUT CONTRAST  TECHNIQUE: Contiguous axial images were obtained from the base of the skull through the vertex without intravenous contrast.  COMPARISON:  CT scan dated 12/17/2009  FINDINGS: No mass lesion. No midline shift. No acute hemorrhage or hematoma. No extra-axial fluid collections. No evidence of acute infarction. The patient has fairly severe cerebral cortical and cerebellar atrophy, progressed since the prior study. Secondary ventricular dilatation. Extensive periventricular white matter lucency, progressed since the prior study with old lacunar infarcts in the basal ganglia.  Chronic changes in paranasal sinuses.  No acute osseous abnormality.  IMPRESSION: No acute intracranial abnormality. Fairly severe atrophy and chronic small vessel ischemic disease.   Electronically Signed   By: Geanie Cooley M.D.   On: 06/13/2013 14:47    Anti-infectives: Anti-infectives   Start     Dose/Rate Route Frequency Ordered Stop   06/09/13 1030  ceFAZolin (ANCEF) IVPB 2 g/50 mL premix  Status:  Discontinued     2 g 100 mL/hr over 30 Minutes Intravenous On call to O.R. 06/09/13 1016 06/09/13 1511   06/09/13 1018  ceFAZolin (ANCEF) 2-3 GM-% IVPB SOLR    Comments:  [name]   : cabinet override      06/09/13 1018 06/09/13 2229      Assessment/Plan: S/p Ex lap, reduction of incarcerated  umbilical hernia, repair with mesh(Dr. Jamey Ripa 06/09/13)  POD#6 Tolerating diet, having BMs, pain well controlled. Stable for discharge to SNF from surgical standpoint. She has a follow up scheduled with Dr. Jamey Ripa on 06/26/13 at 10:30AM. Cherlynn Polo will need to be removed in 4-6 days   LOS: 7 days   Aedan Geimer ANP-BC 06/15/2013 9:08 AM

## 2013-06-15 NOTE — Care Management Note (Signed)
  Page 1 of 1   06/15/2013     9:53:58 AM   CARE MANAGEMENT NOTE 06/15/2013  Patient:  [name], [name]   Account Number:  [id number]  Date Initiated:    Documentation initiated by:    Subjective/Objective Assessment:     Action/Plan:   Anticipated DC Date:  06/15/2013   Anticipated DC Plan:  HOME W HOME HEALTH SERVICES         Choice offered to / List presented to:  C-1 Patient        HH arranged  HH-2 PT      [facility] agency  [facility]   Status of service:  Completed, signed off Medicare Important Message given?   (If response is "NO", the following Medicare IM given date fields will be blank) Date Medicare IM given:   Date Additional Medicare IM given:    Discharge Disposition:    Per UR Regulation:    If discussed at Long Length of Stay Meetings, dates discussed:    Comments:  06-15-13 Spoke to patient at bedside and her husband via telephone ., they do not want a HHRN , and have all DME patient needs at home. Ronny Flurry RN BSN [phone]

## 2013-06-15 NOTE — ED Provider Notes (Signed)
Medical screening examination/treatment/procedure(s) were conducted as a shared visit with non-physician practitioner(s) and myself.  I personally evaluated the patient during the encounter.  EKG Interpretation    Date/Time:  Tuesday June 09 2013 10:33:02 EST Ventricular Rate:  73 PR Interval:  186 QRS Duration: 76 QT Interval:  428 QTC Calculation: 471 R Axis:   -16 Text Interpretation:  Normal sinus rhythm Inferior infarct , age undetermined Abnormal ECG ED PHYSICIAN INTERPRETATION AVAILABLE IN [facility] Confirmed by TEST, RECORD ([id number]) on 06/11/2013 2:14:48 PM            Seen and evaluated for abdominal, chest and back pain. CAT scan was performed to evaluate for possible vascular etiology such as AAA or dissection. No vascular abnormalities seen, the patient is seen to have small bowel obstruction, likely secondary to hernia. General surgery consult. Patient ultimately admitted to medicine.  Gilda Crease, MD 06/15/13 2330

## 2013-06-15 NOTE — Discharge Summary (Signed)
Physician Discharge Summary  [name] FAO:[id number] DOB: January 31, 1931 DOA: 06/08/2013  PCP: Crawford Givens, MD  Admit date: 06/08/2013 Discharge date: 06/15/2013  Time spent: >35 minutes  Recommendations for Outpatient Follow-up:  HHC  F/u with PCP next week F/u with surgery as scheduled  Discharge Diagnoses:  Principal Problem:   SBO (small bowel obstruction) Active Problems:   HYPOTHYROIDISM NOS   COPD (chronic obstructive pulmonary disease)   HTN (hypertension)   Discharge Condition: stable   Diet recommendation: heart healthy   Filed Weights   06/11/13 0602 06/13/13 0500 06/14/13 0708  Weight: 90.039 kg (198 lb 8 oz) 89.3 kg (196 lb 13.9 oz) 84.8 kg (186 lb 15.2 oz)    History of present illness:  79 y.o. female with history of hypertension, COPD, hypothyroidism presented to the ER because of sudden onset of abdominal pain radiating to the back since last evening. CT abdomen and pelvis was done in the ER which shows features consistent with small bowel obstruction with transition point at small bowel containing paraumblical hernia   Hospital Course:  1. SBO s/p Ex lap, reduction of incarcerated umbilical hernia, repair with mesh (Dr. Jamey Ripa 06/09/13)  -resolved; cont management per surgery; outpatient follow up for staple removal  2. 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 Darrick Penna vascular surgeon who has advised no acute intervention at this time. Cont outpatient follow up  3. Hypertension , resume amlodipine;   4. Hypothyroidism; cont levothyroxine;  5. COPD/chronic resp failure on home oxygen; no s/s of acute exacerbation;'  -cont home regimen  6. Encephalopathy, delirium likely med related, opioids vs benzo; neuro exam no focal; no s/s of infection;  -clinically resolved; likely underlying dementia; CT head: severe brain atrophy; d/c narcotics, resumed xanax prevent withdrawals; trazodone for sleep prn;    -d/w patient, family agreed with plan of care; initially thought possible SNF,but later family requested HHC and go home    Procedures:  S/p hernia repair 06/11/13 (i.e. Studies not automatically included, echos, thoracentesis, etc; not x-rays)  Consultations:  Surgery   Discharge Exam: Filed Vitals:   06/15/13 0553  BP: 144/62  Pulse: 75  Temp: 98 F (36.7 C)  Resp: 17    General: alert Cardiovascular: s1,s2 rrr Respiratory: CTA BL  Discharge Instructions  Discharge Orders   Future Appointments Provider Department Dept Phone   06/26/2013 10:40 AM Currie Paris, MD [facility], [place] [phone]   Future Orders Complete By Expires   Diet - low sodium heart healthy  As directed    Discharge instructions  As directed    Comments:     Please follow up with surgery in 1-2 weeks Dr. Jamey Ripa on 06/26/13 at 10:30AM   Increase activity slowly  As directed        Medication List    STOP taking these medications       oxyCODONE-acetaminophen 7.5-325 MG per tablet  Commonly known as:  PERCOCET     zolpidem 10 MG tablet  Commonly known as:  AMBIEN      TAKE these medications       acetaminophen 325 MG tablet  Commonly known as:  TYLENOL  Take 2 tablets (650 mg total) by mouth every 6 (six) hours as needed for mild pain, moderate pain or fever.     albuterol 108 (90 BASE) MCG/ACT inhaler  Commonly known as:  PROVENTIL HFA;VENTOLIN HFA  Inhale 1-2 puffs into the lungs every  6 (six) hours as needed for wheezing or shortness of breath.     ALPRAZolam 1 MG tablet  Commonly known as:  XANAX  Take 0.5 mg by mouth 2 (two) times daily as needed for anxiety.     amLODipine 2.5 MG tablet  Commonly known as:  NORVASC  Take 1 tablet (2.5 mg total) by mouth daily.     CALTRATE 600 PO  Take 1 tablet by mouth daily.     cetirizine 10 MG tablet  Commonly known as:  ZYRTEC  Take 10 mg by mouth daily.     DULoxetine 60 MG capsule  Commonly known as:   CYMBALTA  Take 1 capsule (60 mg total) by mouth daily.     fluticasone 50 MCG/ACT nasal spray  Commonly known as:  FLONASE  Place 1 spray into both nostrils daily.     fluticasone-salmeterol 230-21 MCG/ACT inhaler  Commonly known as:  ADVAIR HFA  Inhale 2 puffs into the lungs 2 (two) times daily.     levothyroxine 112 MCG tablet  Commonly known as:  SYNTHROID, LEVOTHROID  Take 1 tablet (112 mcg total) by mouth daily before breakfast.     multivitamin capsule  Take 1 capsule by mouth daily.     ranitidine 300 MG tablet  Commonly known as:  ZANTAC  Take 300 mg by mouth 2 (two) times daily.     tiotropium 18 MCG inhalation capsule  Commonly known as:  SPIRIVA  Place 1 capsule (18 mcg total) into inhaler and inhale daily.     traZODone 50 MG tablet  Commonly known as:  DESYREL  Take 1 tablet (50 mg total) by mouth at bedtime as needed for sleep.     Vitamin D3 1000 UNITS Caps  Take 1 capsule by mouth daily.     VOLTAREN 1 % Gel  Generic drug:  diclofenac sodium  Apply 4 g topically Twice daily.       Allergies  Allergen Reactions  . Doxycycline     GI upset  . Sulfadiazine     REACTION: Fever, achey       Follow-up Information   Follow up with Currie Paris, MD On 06/26/2013. (10:30, arrive by 10:15am to check in.  this is a post operative check with surgery)    Specialty:  General Surgery   Contact information:   [address] [place] [address] [phone]       Follow up with Crawford Givens, MD. Schedule an appointment as soon as possible for a visit in 3 days.   Specialty:  Family Medicine   Contact information:   [address] [place] [address] [phone]        The results of significant diagnostics from this hospitalization (including imaging, microbiology, ancillary and laboratory) are listed below for reference.    Significant Diagnostic Studies: Ct Head Wo Contrast  06/13/2013   CLINICAL DATA:  Acute  altered mental status after anesthesia.  EXAM: CT HEAD WITHOUT CONTRAST  TECHNIQUE: Contiguous axial images were obtained from the base of the skull through the vertex without intravenous contrast.  COMPARISON:  CT scan dated 12/17/2009  FINDINGS: No mass lesion. No midline shift. No acute hemorrhage or hematoma. No extra-axial fluid collections. No evidence of acute infarction. The patient has fairly severe cerebral cortical and cerebellar atrophy, progressed since the prior study. Secondary ventricular dilatation. Extensive periventricular white matter lucency, progressed since the prior study with old lacunar infarcts in the basal  ganglia.  Chronic changes in paranasal sinuses.  No acute osseous abnormality.  IMPRESSION: No acute intracranial abnormality. Fairly severe atrophy and chronic small vessel ischemic disease.   Electronically Signed   By: Geanie Cooley M.D.   On: 06/13/2013 14:47   Ct Angio Chest W/cm &/or Wo Cm  06/08/2013   CLINICAL DATA:  Abdominal pain, rule out dissection  EXAM: CT ANGIOGRAPHY CHEST, ABDOMEN AND PELVIS  TECHNIQUE: Multidetector CT imaging through the chest, abdomen and pelvis was performed using the standard protocol during bolus administration of intravenous contrast. Multiplanar reconstructed images including MIPs were obtained and reviewed to evaluate the vascular anatomy.  CONTRAST:  OMNIPAQUE IOHEXOL 350 MG/ML SOLN  COMPARISON:  03/15/2008 pelvic CT without contrast, 11/22/2004 chest CT  FINDINGS: CTA CHEST FINDINGS  Aneurysmal dilatation of the ascending aorta up to 3.6 cm, tapers along the arch to a normal caliber. The aorta is tortuous in caliber as arch branch vessels, with scattered atherosclerotic disease. No aortic dissection.  Cardiomegaly. Coronary artery and mitral valvular calcifications. No pleural or pericardial effusion. No intrathoracic lymphadenopathy.  Respiratory motion degrades detail lung parenchymal evaluation. Small amount of mucus/ debris layers  within the trachea. Central airways are otherwise patent. Mild lower lobe peribronchial thickening. Linear lung base opacities, favor atelectasis or scarring. No pneumothorax. Biapical scarring.  Osteopenia and multilevel degenerative changes. Sequelae of prior posterior right rib fracture. No acute osseous finding. Right shoulder arthroplasty results in streak artifact. Streak artifact obscures the thyroid gland. There is a questionable right lobe nodule on image 17/ 290 of series 7.  Review of the MIP images confirms the above findings.  CTA ABDOMEN AND PELVIS FINDINGS  Scattered atherosclerotic disease of the aorta and branch vessels. Dilatation up to 3 cm at the level of the hiatus and the 3.3 cm at the level of the right renal artery. There is a short segment dissection at the level of the left renal artery origin as seen on images 157- 159/290. Multifocal ectasia of the infrarenal aorta up to 2.7 cm. Tortuous iliac arteries, normal caliber with scattered atherosclerotic disease.  Organ evaluation is limited in the arterial phase. Within this limitation, low-attenuation of the liver suggests fatty infiltration.  No radiodense gallstones. Mild extrahepatic biliary ductal prominence, with CBD measuring up to 8 mm to the level of the ampulla. No obstructing lesion is visualized. Unremarkable spleen. Fatty atrophy of the pancreas. New unremarkable adrenal glands.  Mildly lobular renal contours. Too small to further characterize hypodensity left kidney posteriorly. Otherwise, symmetric renal enhancement. No hydroureteronephrosis.  Redundant sigmoid colon. Colonic diverticulosis. No overt colitis or diverticulitis. Normal appendix. Small hiatal hernia. Dilated small bowel loops with air-fluid levels, measuring up to 3.1 cm in diameter. Small bowel containing paraumbilical hernia is a transition point, with the small bowel distal to this point decompressed. Small amount of interloop and dependent fluid within the  intraperitoneal cavity. No free intraperitoneal air. No bowel wall pneumatosis. Mildly prominent porta hepatis lymph nodes.  Enlarged/heterogeneously enhancing uterus. I cannot differentiate adnexa.  Multilevel degenerative change. Osteopenia. No acute osseous finding.  Review of the MIP images confirms the above findings.  IMPRESSION: Small bowel obstruction, transition point is a small bowel containing paraumbilical hernia. Recommend surgical consultation.  Multifocal ectatic and aneurysmal dilated abdominal aorta, measuring up to 3.2 cm. 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  Enlarged/heterogeneously enhancing uterus may reflect fibroids however given continued enlargement, recommend nonemergent ultrasound and GYN consultation.  Right thyroid lobe  nodule is questioned (the gland is obscured by streak artifact from a right shoulder arthroplasty). Recommend nonemergent thyroid ultrasound follow-up.  These results were called by telephone at the time of interpretation on 06/08/2013 at 11:05 PM to Dr. Blinda Leatherwood, who verbally acknowledged these results.   Electronically Signed   By: Jearld Lesch M.D.   On: 06/08/2013 23:08   Ct Cta Abd/pel W/cm &/or W/o Cm  06/08/2013   CLINICAL DATA:  Abdominal pain, rule out dissection  EXAM: CT ANGIOGRAPHY CHEST, ABDOMEN AND PELVIS  TECHNIQUE: Multidetector CT imaging through the chest, abdomen and pelvis was performed using the standard protocol during bolus administration of intravenous contrast. Multiplanar reconstructed images including MIPs were obtained and reviewed to evaluate the vascular anatomy.  CONTRAST:  OMNIPAQUE IOHEXOL 350 MG/ML SOLN  COMPARISON:  03/15/2008 pelvic CT without contrast, 11/22/2004 chest CT  FINDINGS: CTA CHEST FINDINGS  Aneurysmal dilatation of the ascending aorta up to 3.6 cm, tapers along the arch to a normal caliber. The aorta is tortuous in caliber as arch branch vessels, with scattered atherosclerotic  disease. No aortic dissection.  Cardiomegaly. Coronary artery and mitral valvular calcifications. No pleural or pericardial effusion. No intrathoracic lymphadenopathy.  Respiratory motion degrades detail lung parenchymal evaluation. Small amount of mucus/ debris layers within the trachea. Central airways are otherwise patent. Mild lower lobe peribronchial thickening. Linear lung base opacities, favor atelectasis or scarring. No pneumothorax. Biapical scarring.  Osteopenia and multilevel degenerative changes. Sequelae of prior posterior right rib fracture. No acute osseous finding. Right shoulder arthroplasty results in streak artifact. Streak artifact obscures the thyroid gland. There is a questionable right lobe nodule on image 17/ 290 of series 7.  Review of the MIP images confirms the above findings.  CTA ABDOMEN AND PELVIS FINDINGS  Scattered atherosclerotic disease of the aorta and branch vessels. Dilatation up to 3 cm at the level of the hiatus and the 3.3 cm at the level of the right renal artery. There is a short segment dissection at the level of the left renal artery origin as seen on images 157- 159/290. Multifocal ectasia of the infrarenal aorta up to 2.7 cm. Tortuous iliac arteries, normal caliber with scattered atherosclerotic disease.  Organ evaluation is limited in the arterial phase. Within this limitation, low-attenuation of the liver suggests fatty infiltration.  No radiodense gallstones. Mild extrahepatic biliary ductal prominence, with CBD measuring up to 8 mm to the level of the ampulla. No obstructing lesion is visualized. Unremarkable spleen. Fatty atrophy of the pancreas. New unremarkable adrenal glands.  Mildly lobular renal contours. Too small to further characterize hypodensity left kidney posteriorly. Otherwise, symmetric renal enhancement. No hydroureteronephrosis.  Redundant sigmoid colon. Colonic diverticulosis. No overt colitis or diverticulitis. Normal appendix. Small hiatal hernia.  Dilated small bowel loops with air-fluid levels, measuring up to 3.1 cm in diameter. Small bowel containing paraumbilical hernia is a transition point, with the small bowel distal to this point decompressed. Small amount of interloop and dependent fluid within the intraperitoneal cavity. No free intraperitoneal air. No bowel wall pneumatosis. Mildly prominent porta hepatis lymph nodes.  Enlarged/heterogeneously enhancing uterus. I cannot differentiate adnexa.  Multilevel degenerative change. Osteopenia. No acute osseous finding.  Review of the MIP images confirms the above findings.  IMPRESSION: Small bowel obstruction, transition point is a small bowel containing paraumbilical hernia. Recommend surgical consultation.  Multifocal ectatic and aneurysmal dilated abdominal aorta, measuring up to 3.2 cm. 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  Enlarged/heterogeneously enhancing  uterus may reflect fibroids however given continued enlargement, recommend nonemergent ultrasound and GYN consultation.  Right thyroid lobe nodule is questioned (the gland is obscured by streak artifact from a right shoulder arthroplasty). Recommend nonemergent thyroid ultrasound follow-up.  These results were called by telephone at the time of interpretation on 06/08/2013 at 11:05 PM to Dr. Blinda Leatherwood, who verbally acknowledged these results.   Electronically Signed   By: Jearld Lesch M.D.   On: 06/08/2013 23:08    Microbiology: Recent Results (from the past 240 hour(s))  SURGICAL PCR SCREEN     Status: None   Collection Time    06/09/13  2:47 AM      Result Value Range Status   MRSA, PCR NEGATIVE  NEGATIVE Final   Staphylococcus aureus NEGATIVE  NEGATIVE Final   Comment:            The Xpert SA Assay (FDA     approved for NASAL specimens     in patients over 5 years of age),     is one component of     a comprehensive surveillance     program.  Test performance has     been validated by [employer] for patients greater     than or equal to 75 year old.     It is not intended     to diagnose infection nor to     guide or monitor treatment.  URINE CULTURE     Status: None   Collection Time    06/09/13  5:33 AM      Result Value Range Status   Specimen Description URINE, CLEAN CATCH   Final   Special Requests Normal   Final   Culture  Setup Time     Final   Value: 06/09/2013 05:33     Performed at [facility] Count     Final   Value: NO GROWTH     Performed at [facility]   Culture     Final   Value: NO GROWTH     Performed at [facility]   Report Status 06/10/2013 FINAL   Final     Labs: Basic Metabolic Panel:  Recent Labs Lab 06/08/13 1919 06/09/13 0555 06/09/13 1600 06/10/13 0418 06/11/13 0500  NA 140 141  --  142 140  K 3.8 3.5  --  3.6 3.3*  CL 99 100  --  103 102  CO2 32 32  --  30 29  GLUCOSE 113* 115*  --  137* 115*  BUN 14 14  --  13 6  CREATININE 0.68 0.74 0.60 0.57 0.54  CALCIUM 9.5 9.4  --  8.7 8.6   Liver Function Tests:  Recent Labs Lab 06/09/13 0555  AST 24  ALT 8  ALKPHOS 65  BILITOT 0.6  PROT 7.0  ALBUMIN 3.4*   No results found for this basename: LIPASE, AMYLASE,  in the last 168 hours No results found for this basename: AMMONIA,  in the last 168 hours CBC:  Recent Labs Lab 06/08/13 1919 06/09/13 0555 06/09/13 1600 06/10/13 0418 06/11/13 0500  WBC 8.7 7.9 7.1 8.8 8.1  NEUTROABS 6.4  --   --   --   --   HGB 12.3 12.0 11.5* 10.9* 10.1*  HCT 37.7 37.8 36.1 33.9* 31.4*  MCV 95.2 95.9 96.0 95.2 94.9  PLT 244 244 229 228 208   Cardiac Enzymes: No results found for  this basename: CKTOTAL, CKMB, CKMBINDEX, TROPONINI,  in the last 168 hours BNP: BNP (last 3 results) No results found for this basename: PROBNP,  in the last 8760 hours CBG:  Recent Labs Lab 06/09/13 0528 06/09/13 0919 06/09/13 2348 06/10/13 0512 06/10/13 1224  GLUCAP 123* 112* 115* 130* 109*        Signed:  Jonette Mate N  [facility] 06/15/2013, 9:41 AM

## 2013-06-17 ENCOUNTER — Telehealth: Payer: Self-pay

## 2013-06-17 NOTE — Telephone Encounter (Signed)
Patient notified as instructed by telephone. Pt scheduled appt 06/19/13 at 10;30 am with Dr Para March (30 min appt). Pt voiced understanding about instructions and appt.

## 2013-06-17 NOTE — Telephone Encounter (Signed)
Pt left v/m was discharged from hospital after 2 hernia surgeries;pt started diarrhea ( loose stools) 06/16/13;pt is not eating solid food. Pt has had 6-8 loose stools today. No N&V and no fever. Pt wants to know what she should eat and should continue liquid diet.Pt is to take clear max after surgery. Pt wants to know if should take med for diarrhea. [facility].pt request cb.

## 2013-06-17 NOTE — Telephone Encounter (Signed)
I would continue current diet for now. If fever, vomiting, blood in stool, or increase in abd pain, then to ER.  Okay stop the clear max tonight and get her in to see me in 1-2 days. 30 min appointment.

## 2013-06-19 ENCOUNTER — Ambulatory Visit: Payer: Medicare Other | Admitting: Family Medicine

## 2013-06-24 ENCOUNTER — Ambulatory Visit (INDEPENDENT_AMBULATORY_CARE_PROVIDER_SITE_OTHER): Payer: Medicare Other | Admitting: Family Medicine

## 2013-06-24 ENCOUNTER — Encounter: Payer: Self-pay | Admitting: Family Medicine

## 2013-06-24 VITALS — BP 122/74 | HR 82 | Temp 98.0°F | Ht 61.0 in | Wt 192.5 lb

## 2013-06-24 DIAGNOSIS — R9389 Abnormal findings on diagnostic imaging of other specified body structures: Secondary | ICD-10-CM

## 2013-06-24 DIAGNOSIS — J9610 Chronic respiratory failure, unspecified whether with hypoxia or hypercapnia: Secondary | ICD-10-CM

## 2013-06-24 DIAGNOSIS — K56609 Unspecified intestinal obstruction, unspecified as to partial versus complete obstruction: Secondary | ICD-10-CM

## 2013-06-24 DIAGNOSIS — G8929 Other chronic pain: Secondary | ICD-10-CM

## 2013-06-24 MED ORDER — TRAZODONE HCL 50 MG PO TABS: 100.0000 mg | ORAL_TABLET | Freq: Every evening | ORAL | Status: DC | PRN

## 2013-06-24 MED ORDER — OXYCODONE-ACETAMINOPHEN 7.5-325 MG PO TABS: 1.0000 | ORAL_TABLET | Freq: Four times a day (QID) | ORAL | Status: DC | PRN

## 2013-06-24 MED ORDER — DICLOFENAC SODIUM 1 % TD GEL: 4.0000 g | Freq: Two times a day (BID) | TRANSDERMAL | Status: DC

## 2013-06-24 NOTE — Progress Notes (Signed)
Pre-visit discussion using our clinic review tool. No additional management support is needed unless otherwise documented below in the visit note.  S/p hernia repair.  She had post op hallucinations after her surgery.  She didn't have diarrhea in the hospital.  Was on clear max. Loose stools started about the time she came home from the hospital.  That is better/resolved now.   She has post op eval with surgery on 06/26/13.  Abd pain now is less than prev.  She is back to noticing the aches she had before the surgery.  No fevers. Not vomiting now. Eating better, progressive/ADAT.  She is back to nearly her regular diet except for fruit.  No constipation.  Her breathing is "great" for her, at baseline, she is happy about that.  On O2 at baseline.    Discussed chronic back and B shoulder pain. Had been on oxycodone prev.  Prev with pain clinic eval and injections, w/o relief.   Insomnia- can sleep with trazodone.  Off ambien now. No ADE on trazodone.    H/o renal artery abnormality, d/w pt.   Meds, vitals, and allergies reviewed.   ROS: See HPI.  Otherwise, noncontributory.  nad On O2 Mmm rrr No focal dec in BS abd wall healing.  1 staple had one end out of the skin, fully removed in clinic w/o complication.  Still with good tissue apposition.  Ext w/o edema

## 2013-06-24 NOTE — Patient Instructions (Signed)
Use the pain medicine if needed for your back, but start back with a 1/2 tab at a time.  Take care.  Glad to see you.

## 2013-06-25 ENCOUNTER — Telehealth: Payer: Self-pay | Admitting: Family Medicine

## 2013-06-25 DIAGNOSIS — R9389 Abnormal findings on diagnostic imaging of other specified body structures: Secondary | ICD-10-CM

## 2013-06-25 NOTE — Assessment & Plan Note (Signed)
Resolved now, she'll f/u with surgery.  See above re: staples.

## 2013-06-25 NOTE — Assessment & Plan Note (Signed)
Continue O2 per baseline.

## 2013-06-25 NOTE — Telephone Encounter (Signed)
Patient advised.  1) Patient says she would like to get the thyroid US.   2) Patient was already aware of the enlarging fibroids and her GYN says she would need a complete hysterectomy and she is not sure of that because of her age.

## 2013-06-25 NOTE — Assessment & Plan Note (Signed)
I'll ask for vascular input on the small focal dissection at the level of the left renal artery origin. See phone note re: thyroid and uterus findings.

## 2013-06-25 NOTE — Telephone Encounter (Addendum)
Please call pt.  Per Dr. Darrick Penna no follow up needed on the renal artery.  If she wants details - Tiny area of dissection is more likely just atherosclerotic change. No compromise of renal function or end organs. Based on age will probably not have significant change.   2 other nonemergent issues noted on the imaging, not mentioned at the OV: Also noted- possible thyroid nodule seen on CT.  Would get an u/s to clarify this.  Also noted that she had likely enlarging fibroids.  Would ask for gyn input on this.  If she has a gyn clinic, then have her f/u there.  If needing referral, then notify me.

## 2013-06-25 NOTE — Assessment & Plan Note (Signed)
Would restart oxycodone but only with a 1/2 tab at a time initially. To be used prn.  Would use lowest dose possible.  She agrees. Discussed constipation cautions.

## 2013-06-26 ENCOUNTER — Encounter (INDEPENDENT_AMBULATORY_CARE_PROVIDER_SITE_OTHER): Payer: Self-pay | Admitting: Surgery

## 2013-06-26 ENCOUNTER — Ambulatory Visit (INDEPENDENT_AMBULATORY_CARE_PROVIDER_SITE_OTHER): Payer: Medicare Other | Admitting: Surgery

## 2013-06-26 VITALS — BP 150/88 | HR 84 | Temp 98.0°F | Resp 15 | Ht 61.0 in | Wt 187.8 lb

## 2013-06-26 DIAGNOSIS — K56609 Unspecified intestinal obstruction, unspecified as to partial versus complete obstruction: Secondary | ICD-10-CM

## 2013-06-26 NOTE — Telephone Encounter (Signed)
U/s ordered. Thanks.

## 2013-06-26 NOTE — Progress Notes (Signed)
NAME: [name]                                            DOB: 1931-02-26 DATE: 06/26/2013                                                   MRN: [id number]  CC:  Chief Complaint  Patient presents with  . Routine Post Op    p/o exp lap    HPI: This patient comes in for post op follow-up .Sheunderwent repair of an incarcerated umbilical hernia on 06/09/13. She feels that she is doing well. She is eating normally, having minimal incisional pain, and having regular bowel movements.  PE:  VITAL SIGNS: BP 150/88  Pulse 84  Temp(Src) 98 F (36.7 C) (Temporal)  Resp 15  Ht 5\' 1"  (1.549 m)  Wt 187 lb 12.8 oz (85.186 kg)  BMI 35.50 kg/m2  General: The patient appears to be healthy, NAD Abdomen: The abdomen is soft and benign. Incision is healing nicely. Staples are still in place and there is some redness from irritation from the staples. There does not appear to be any infection.    IMPRESSION: The patient is doing well S/P Repair of incarcerated umbilical hernia. Staples are ready to be removed.    PLAN: Staples removed and Steri-Strips applied; we'll see the patient back in about 2 weeks.

## 2013-07-01 ENCOUNTER — Encounter: Payer: Self-pay | Admitting: Family Medicine

## 2013-07-07 ENCOUNTER — Encounter: Payer: Self-pay | Admitting: Family Medicine

## 2013-07-07 ENCOUNTER — Ambulatory Visit
Admission: RE | Admit: 2013-07-07 | Discharge: 2013-07-07 | Disposition: A | Discharge status: Home / Self Care | Payer: Medicare Other | Source: Ambulatory Visit | Attending: Family Medicine | Admitting: Family Medicine

## 2013-07-07 ENCOUNTER — Other Ambulatory Visit: Payer: Self-pay | Admitting: Family Medicine

## 2013-07-07 DIAGNOSIS — R9389 Abnormal findings on diagnostic imaging of other specified body structures: Secondary | ICD-10-CM

## 2013-07-07 DIAGNOSIS — E042 Nontoxic multinodular goiter: Secondary | ICD-10-CM | POA: Insufficient documentation

## 2013-07-13 ENCOUNTER — Other Ambulatory Visit: Payer: Self-pay | Admitting: *Deleted

## 2013-07-13 MED ORDER — ALPRAZOLAM 1 MG PO TABS: 0.5000 mg | ORAL_TABLET | Freq: Two times a day (BID) | ORAL | Status: DC | PRN

## 2013-07-13 NOTE — Telephone Encounter (Signed)
Please call in.  Thanks.   

## 2013-07-13 NOTE — Telephone Encounter (Signed)
Ok to refill 

## 2013-07-13 NOTE — Telephone Encounter (Signed)
Medication phoned to pharmacy.  

## 2013-07-14 ENCOUNTER — Encounter (INDEPENDENT_AMBULATORY_CARE_PROVIDER_SITE_OTHER): Payer: Medicare Other | Admitting: General Surgery

## 2013-07-17 ENCOUNTER — Ambulatory Visit: Payer: Medicare Other | Admitting: Internal Medicine

## 2013-07-20 ENCOUNTER — Ambulatory Visit: Payer: Medicare Other | Admitting: Internal Medicine

## 2013-07-21 ENCOUNTER — Ambulatory Visit: Payer: Medicare Other | Admitting: Adult Health

## 2013-07-21 ENCOUNTER — Telehealth: Payer: Self-pay | Admitting: Critical Care Medicine

## 2013-07-21 ENCOUNTER — Ambulatory Visit: Payer: Medicare Other | Admitting: Critical Care Medicine

## 2013-07-21 MED ORDER — PREDNISONE 10 MG PO TABS: ORAL_TABLET | ORAL | Status: DC

## 2013-07-21 MED ORDER — AZITHROMYCIN 250 MG PO TABS: ORAL_TABLET | ORAL | Status: AC

## 2013-07-21 MED ORDER — OSELTAMIVIR PHOSPHATE 75 MG PO CAPS: 75.0000 mg | ORAL_CAPSULE | Freq: Two times a day (BID) | ORAL | Status: DC

## 2013-07-21 NOTE — Telephone Encounter (Signed)
Call in :  tamiflu 75mg  bid x 5days;  Prednisone 10mg  Take 4 tablets daily for 5 days then stop #20 Also call in azithromycin 250mg  Take two once then one daily until gone

## 2013-07-21 NOTE — Telephone Encounter (Signed)
Pt is aware of PW recs. Rx's have been sent in. Nothing further was needed.

## 2013-07-21 NOTE — Telephone Encounter (Signed)
Spoke with pt. States that she believes she may have the flu. Had an appointment with PW today but canceled due to weather conditions. Reports severe coughing with production of "ivory" colored mucus. Denies fever/chills, sweats, nausea, vomiting, chest tightness or SOB. Would like prednisone to be called in.  PW - please advise. Thanks!

## 2013-07-21 NOTE — Telephone Encounter (Signed)
Called spoke with spouse. He r/s pt appt to see PW in [place] this afternoon at 3 PM. Nothing further needed

## 2013-07-22 ENCOUNTER — Encounter (INDEPENDENT_AMBULATORY_CARE_PROVIDER_SITE_OTHER): Payer: Medicare Other | Admitting: General Surgery

## 2013-07-23 ENCOUNTER — Ambulatory Visit: Payer: Medicare Other | Admitting: Adult Health

## 2013-08-06 ENCOUNTER — Ambulatory Visit: Payer: Medicare Other | Admitting: Adult Health

## 2013-08-07 ENCOUNTER — Other Ambulatory Visit: Payer: Self-pay | Admitting: Family Medicine

## 2013-08-07 NOTE — Telephone Encounter (Signed)
Pt requesting Oxycodone refill.  Last filled 06/24/13.

## 2013-08-07 NOTE — Telephone Encounter (Signed)
Receiving this late - will route to PCP for Monday.

## 2013-08-09 MED ORDER — OXYCODONE-ACETAMINOPHEN 7.5-325 MG PO TABS: 1.0000 | ORAL_TABLET | Freq: Four times a day (QID) | ORAL | Status: DC | PRN

## 2013-08-09 NOTE — Telephone Encounter (Signed)
Printed

## 2013-08-10 NOTE — Telephone Encounter (Signed)
Patient's husband notified that script is up front and ready for pickup.

## 2013-08-31 ENCOUNTER — Other Ambulatory Visit: Payer: Self-pay | Admitting: Family Medicine

## 2013-09-07 ENCOUNTER — Other Ambulatory Visit: Payer: Self-pay

## 2013-09-07 MED ORDER — OXYCODONE-ACETAMINOPHEN 7.5-325 MG PO TABS: 1.0000 | ORAL_TABLET | Freq: Four times a day (QID) | ORAL | Status: DC | PRN

## 2013-09-07 NOTE — Telephone Encounter (Signed)
Pt left message requesting rx oxycodone apap. Call when ready for pick up.

## 2013-09-07 NOTE — Telephone Encounter (Signed)
Printed.  Thanks.  

## 2013-09-08 NOTE — Telephone Encounter (Signed)
Patient's husband notified that script is up front and ready for pickup. 

## 2013-09-24 ENCOUNTER — Encounter: Payer: Self-pay | Admitting: Radiology

## 2013-09-25 ENCOUNTER — Encounter: Payer: Self-pay | Admitting: Family Medicine

## 2013-09-25 ENCOUNTER — Ambulatory Visit (INDEPENDENT_AMBULATORY_CARE_PROVIDER_SITE_OTHER): Payer: Medicare Other | Admitting: Family Medicine

## 2013-09-25 VITALS — BP 120/68 | HR 84 | Temp 97.9°F | Wt 188.8 lb

## 2013-09-25 DIAGNOSIS — G8929 Other chronic pain: Secondary | ICD-10-CM

## 2013-09-25 MED ORDER — OXYCODONE HCL 10 MG PO TABS: 10.0000 mg | ORAL_TABLET | Freq: Three times a day (TID) | ORAL | Status: DC | PRN

## 2013-09-25 NOTE — Patient Instructions (Addendum)
Change to 10mg  of oxycodone up to 3 times a day.  Take a stool softener in the meantime and report back to me next week.  Take care. Glad to see you.

## 2013-09-25 NOTE — Progress Notes (Signed)
Pre visit review using our clinic review tool, if applicable. No additional management support is needed unless otherwise documented below in the visit note.  R arm fx. Casted at the forearm now.  Still with significant pain from the fracture.  She continues to have L shoulder pain and she admits this is excruciating.  This is in addition to there baseline of aches that prev needed opiate tx.  D/w pt about options.  Her current meds aren't cutting the pain or lasting long enough.  No ADE from meds.    She had trouble sleeping, likely in addition to and not just from the pain above.    Meds, vitals, and allergies reviewed.   ROS: See HPI.  Otherwise, noncontributory.  Nad but uncomfortable, on O2 Mmm rrr Globally dec BS but no inc in WOB abd soft Pain with minimal ROM L shoulder R forearm casted, distally NV intact

## 2013-09-28 NOTE — Assessment & Plan Note (Signed)
With acute/chronic components, will inc opiates to 10mg  oxycodone up to tid and she'll update me.  Sedation caution given, d/w pt about GI precautions.  She agrees.  Will update me and then we can proceed from that point about further med changes, for either pain or insomnia.  I didn't want to change two meds at one time. She agrees. App ortho input.

## 2013-10-01 ENCOUNTER — Telehealth: Payer: Self-pay

## 2013-10-01 MED ORDER — OXYCODONE HCL 10 MG PO TABS: 10.0000 mg | ORAL_TABLET | Freq: Four times a day (QID) | ORAL | Status: DC | PRN

## 2013-10-01 NOTE — Telephone Encounter (Signed)
Pt left v/m; pt was seen 09/25/13 and pt said oxycodone does help pain somewhat but oxycodone does not last long. Pt wants to know if can take more often than 3 times a day. Pt having problems sleeping and pt taking Trazodone 50 mg taking 2 tabs at bedtime. Pt wants to know if can take more Trazodone? Also pt taking alprazolam 1 mg taking 1/2 tab twice a day as needed. Pt wants to know if can increase alprazolam also due to pt having a lot of anxiety. [facility]

## 2013-10-01 NOTE — Telephone Encounter (Signed)
Left detailed message on voicemail.  

## 2013-10-01 NOTE — Telephone Encounter (Signed)
Can take oxycodone up to 4 times a day.  I wouldn't change more than 1 med at a time.  Have her update [place] in a few days. Thanks.

## 2013-10-16 MED ORDER — OXYCODONE HCL 10 MG PO TABS: 10.0000 mg | ORAL_TABLET | Freq: Four times a day (QID) | ORAL | Status: DC | PRN

## 2013-10-16 NOTE — Telephone Encounter (Signed)
Pt said when she takes oxycodone 4 times a day it does help pain. Pt said only has 2 oxycodone left; pt request to pick up oxycodone rx today if possible; call when ready for pick up.

## 2013-10-16 NOTE — Addendum Note (Signed)
Addended by: Helene Shoe on: 10/16/2013 02:27 PM   Modules accepted: Orders

## 2013-10-16 NOTE — Telephone Encounter (Signed)
Patient advised.  Rx left at front desk for pick up. 

## 2013-10-16 NOTE — Addendum Note (Signed)
Addended by: Tonia Ghent on: 10/16/2013 02:41 PM   Modules accepted: Orders

## 2013-10-16 NOTE — Telephone Encounter (Signed)
Printed.  Thanks.  

## 2013-11-05 ENCOUNTER — Other Ambulatory Visit: Payer: Self-pay

## 2013-11-05 ENCOUNTER — Other Ambulatory Visit: Payer: Self-pay | Admitting: Family Medicine

## 2013-11-05 MED ORDER — ALPRAZOLAM 1 MG PO TABS: 0.5000 mg | ORAL_TABLET | Freq: Two times a day (BID) | ORAL | Status: DC | PRN

## 2013-11-05 NOTE — Telephone Encounter (Signed)
Pt left v/m requesting rx oxycodone. Call when ready for pick up. 

## 2013-11-05 NOTE — Telephone Encounter (Signed)
She should have plenty left.  Filled on 10/16/13.

## 2013-11-05 NOTE — Telephone Encounter (Signed)
Please call in.  Thanks.   

## 2013-11-06 NOTE — Telephone Encounter (Signed)
Medication phoned to pharmacy.  

## 2013-11-09 ENCOUNTER — Other Ambulatory Visit: Payer: Self-pay | Admitting: *Deleted

## 2013-11-09 MED ORDER — OXYCODONE HCL 10 MG PO TABS: 10.0000 mg | ORAL_TABLET | Freq: Four times a day (QID) | ORAL | Status: DC | PRN

## 2013-11-09 NOTE — Telephone Encounter (Signed)
Printed

## 2013-11-09 NOTE — Telephone Encounter (Signed)
Tried to phone patient to inform that Rx was ready for pick up.  Husband stated this morning that they have had phone problems recently.  Was not able to get through on the home phone.  Message left on cell number.  Rx left at front desk for pick up.

## 2013-11-09 NOTE — Telephone Encounter (Signed)
Patient requests refill, call when ready for pick up.  Husband says she is completely out of medication.  They called for the refill last week but never received a call saying it was ready for pickup.  I see no request in EPIC.  Last filled:  120 tablets 0RF on 10/16/2013.

## 2013-11-13 ENCOUNTER — Other Ambulatory Visit: Payer: Self-pay | Admitting: Family Medicine

## 2013-12-11 ENCOUNTER — Encounter: Payer: Self-pay | Admitting: Critical Care Medicine

## 2013-12-11 ENCOUNTER — Ambulatory Visit (INDEPENDENT_AMBULATORY_CARE_PROVIDER_SITE_OTHER): Payer: Medicare Other | Admitting: Critical Care Medicine

## 2013-12-11 ENCOUNTER — Encounter (INDEPENDENT_AMBULATORY_CARE_PROVIDER_SITE_OTHER): Payer: Self-pay

## 2013-12-11 VITALS — BP 120/68 | HR 72 | Ht 62.0 in | Wt 185.0 lb

## 2013-12-11 DIAGNOSIS — J449 Chronic obstructive pulmonary disease, unspecified: Secondary | ICD-10-CM

## 2013-12-11 NOTE — Progress Notes (Signed)
Patient ID: [name], female    DOB: 06-Jan-1931, 80 y.o.   MRN: [id number]  HPI  80 y.o. F asthmatic bronchitis  Gold C Copd  12/11/2013 Chief Complaint  Patient presents with  . 6 month follow up    Increased cough x 1-2 months.  Cough is prod with clear to tan.  Has fatigue with any activity.  SOB gradually worsening with activity over the past yr.  No chest tightness/pain.   Since last OV pt had hernia surgery.  No new issues on breathing.   Pt denies any significant sore throat, nasal congestion or excess secretions, fever, chills, sweats, unintended weight loss, pleurtic or exertional chest pain, orthopnea PND, or leg swelling Pt denies any increase in rescue therapy over baseline, denies waking up needing it or having any early am or nocturnal exacerbations of coughing/wheezing/or dyspnea. Pt also denies any obvious fluctuation in symptoms with  weather or environmental change or other alleviating or aggravating factors   This patient continues to use and benefit from her oxygen therapy and has re qualified at this visit for continued oxygen therapy    Review of Systems  Constitutional:   No  weight loss, night sweats,  Fevers, chills, fatigue, or  lassitude.  HEENT:   No headaches,  Difficulty swallowing,  Tooth/dental problems, or  Sore throat,                No sneezing, itching, ear ache,  +nasal congestion, post nasal drip,   CV:  No chest pain,  Orthopnea, PND, swelling in lower extremities, anasarca, dizziness, palpitations, syncope.   GI  No heartburn, indigestion, abdominal pain, nausea, vomiting, diarrhea, change in bowel habits, loss of appetite, bloody stools.   Resp:    No coughing up of blood.     No chest wall deformity  Skin: no rash or lesions.  GU: no dysuria, change in color of urine, no urgency or frequency.  No flank pain, no hematuria   MS:  No joint pain or swelling.  No decreased range of motion.  No back pain.  Psych:  No change in mood  or affect. No depression or anxiety.  No memory loss.     Objective:   Physical Exam BP 120/68  Pulse 72  Ht 5\' 2"  (1.575 m)  Wt 185 lb (83.915 kg)  BMI 33.83 kg/m2  SpO2 94%   GEN: A/Ox3; pleasant , NAD, overweight, O2  HEENT:  [place]/AT,  EACs-clear, TMs-wnl, NOSE-clear drainage , THROAT-clear, no lesions, no postnasal drip or exudate noted.   NECK:  Supple w/ fair ROM; no JVD; normal carotid impulses w/o bruits; no thyromegaly or nodules palpated; no lymphadenopathy.  RESP  -distant breath sounds   CARD:  RRR, no m/r/g  , no peripheral edema, pulses intact, no cyanosis or clubbing.  GI:   Soft & nt; nml bowel sounds; no organomegaly or masses detected.  Musco: Warm bil, no deformities or joint swelling noted.   Neuro: alert, no focal deficits noted.    Skin: Warm, no lesions or rashes  No results found.        Gold C Copd with asthmatic bronchitis component and chronic resp failure Stable Gold C Copd and chronic resp failure  Plan No change in inhaled or maintenance medications. Return in  4 months    Updated Medication List Outpatient Encounter Prescriptions as of 12/11/2013  Medication Sig  . albuterol (PROVENTIL HFA;VENTOLIN HFA) 108 (90 BASE) MCG/ACT inhaler Inhale 1-2  puffs into the lungs every 6 (six) hours as needed for wheezing or shortness of breath.  . ALPRAZolam (XANAX) 1 MG tablet Take 0.5 tablets (0.5 mg total) by mouth 2 (two) times daily as needed for anxiety.  Marland Kitchen amLODipine (NORVASC) 2.5 MG tablet TAKE 1 TABLET BY MOUTH DAILY.  . Calcium Carbonate (CALTRATE 600 PO) Take 1 tablet by mouth daily.    . cetirizine (ZYRTEC) 10 MG tablet Take 10 mg by mouth daily.  . Cholecalciferol (VITAMIN D3) 1000 UNITS CAPS Take 1 capsule by mouth daily.    . diclofenac sodium (VOLTAREN) 1 % GEL Apply 4 g topically 2 (two) times daily.  . DULoxetine (CYMBALTA) 60 MG capsule Take 1 capsule (60 mg total) by mouth daily.  . fluticasone (FLONASE) 50 MCG/ACT nasal spray  PLACE 1 SPRAY INTO THE NOSE DAILY.  . fluticasone-salmeterol (ADVAIR HFA) 230-21 MCG/ACT inhaler Inhale 2 puffs into the lungs 2 (two) times daily.  Marland Kitchen levothyroxine (SYNTHROID, LEVOTHROID) 112 MCG tablet Take 1 tablet (112 mcg total) by mouth daily before breakfast.  . Multiple Vitamin (MULTIVITAMIN) capsule Take 1 capsule by mouth daily.    . NON FORMULARY Oxygen Use 3 liters 24/7  . Oxycodone HCl 10 MG TABS Take 1 tablet (10 mg total) by mouth 4 (four) times daily as needed (pain.sedation caution).  . potassium chloride (K-DUR,KLOR-CON) 10 MEQ tablet TAKE 1 TABLET BY MOUTH DAILY.  . ranitidine (ZANTAC) 300 MG tablet Take 300 mg by mouth 2 (two) times daily.  Marland Kitchen tiotropium (SPIRIVA) 18 MCG inhalation capsule Place 1 capsule (18 mcg total) into inhaler and inhale daily.  . [DISCONTINUED] fluticasone (FLONASE) 50 MCG/ACT nasal spray Place 1 spray into both nostrils daily.  . [DISCONTINUED] acetaminophen (TYLENOL) 325 MG tablet Take 2 tablets (650 mg total) by mouth every 6 (six) hours as needed for mild pain, moderate pain or fever.

## 2013-12-11 NOTE — Patient Instructions (Signed)
No change in medications. Return in         4 months 

## 2013-12-12 NOTE — Assessment & Plan Note (Signed)
Stable Gold C Copd and chronic resp failure  Plan No change in inhaled or maintenance medications. Return in  4 months

## 2013-12-14 ENCOUNTER — Other Ambulatory Visit: Payer: Self-pay

## 2013-12-14 NOTE — Telephone Encounter (Signed)
Pt left v/m requesting rx oxycodone. Call when ready for pick up. 

## 2013-12-15 MED ORDER — OXYCODONE HCL 10 MG PO TABS: 10.0000 mg | ORAL_TABLET | Freq: Four times a day (QID) | ORAL | Status: DC | PRN

## 2013-12-15 NOTE — Telephone Encounter (Signed)
Patient advised.  Rx left at front desk for pick up. 

## 2013-12-15 NOTE — Telephone Encounter (Signed)
Printed.  Thanks.  

## 2013-12-29 ENCOUNTER — Other Ambulatory Visit: Payer: Self-pay | Admitting: Family Medicine

## 2014-01-11 ENCOUNTER — Other Ambulatory Visit: Payer: Self-pay

## 2014-01-11 NOTE — Telephone Encounter (Signed)
Pt left v/m requesting rx oxycodone apap. Call when ready for pick up.

## 2014-01-12 MED ORDER — OXYCODONE HCL 10 MG PO TABS: 10.0000 mg | ORAL_TABLET | Freq: Four times a day (QID) | ORAL | Status: DC | PRN

## 2014-01-12 NOTE — Telephone Encounter (Signed)
Printed.  Thanks.  

## 2014-01-12 NOTE — Telephone Encounter (Signed)
Patient advised.  Rx left at front desk for pick up. 

## 2014-01-18 ENCOUNTER — Other Ambulatory Visit: Payer: Self-pay | Admitting: Family Medicine

## 2014-01-18 NOTE — Telephone Encounter (Signed)
Electronic refill request. Last office visit:   09/25/13.  This medication is not on the patient's current meds list.  Please advise.

## 2014-01-19 NOTE — Telephone Encounter (Signed)
She has been on it. Sent.  Thanks.

## 2014-01-20 ENCOUNTER — Other Ambulatory Visit: Payer: Self-pay | Admitting: Family Medicine

## 2014-01-20 NOTE — Telephone Encounter (Signed)
Received refill request electronically from pharmacy. Last refill 06/24/13, 200 G/3 refills, last office visit 09/25/13. Is it okay to refill medication?

## 2014-01-21 NOTE — Telephone Encounter (Signed)
Sent!

## 2014-02-08 ENCOUNTER — Other Ambulatory Visit: Payer: Self-pay

## 2014-02-08 NOTE — Telephone Encounter (Signed)
Pt request rx oxycodone. Call when ready for pick up.

## 2014-02-09 MED ORDER — OXYCODONE HCL 10 MG PO TABS: 10.0000 mg | ORAL_TABLET | Freq: Four times a day (QID) | ORAL | Status: DC | PRN

## 2014-02-09 NOTE — Telephone Encounter (Signed)
Patient advised.  Rx left at front desk for pick up. 

## 2014-02-09 NOTE — Telephone Encounter (Signed)
Printed.  Thanks.  

## 2014-02-10 ENCOUNTER — Ambulatory Visit: Payer: Medicare Other | Admitting: Family Medicine

## 2014-02-15 ENCOUNTER — Other Ambulatory Visit: Payer: Self-pay | Admitting: *Deleted

## 2014-02-15 NOTE — Telephone Encounter (Signed)
Fax refill request.

## 2014-02-16 MED ORDER — ALPRAZOLAM 1 MG PO TABS: 0.5000 mg | ORAL_TABLET | Freq: Two times a day (BID) | ORAL | Status: DC | PRN

## 2014-02-16 NOTE — Telephone Encounter (Signed)
Please call in.  Thanks.   

## 2014-02-16 NOTE — Telephone Encounter (Signed)
Medication phoned to pharmacy.  

## 2014-03-05 ENCOUNTER — Other Ambulatory Visit: Payer: Self-pay

## 2014-03-05 NOTE — Telephone Encounter (Signed)
Pt left v/m requesting rx oxycodone. Call when ready for pick up. 

## 2014-03-07 NOTE — Telephone Encounter (Signed)
She has been appropriate with this.  I won't be there to fill this until Tuesday.  I would have filled this Monday if were in clinic.

## 2014-03-08 MED ORDER — OXYCODONE HCL 10 MG PO TABS: 10.0000 mg | ORAL_TABLET | Freq: Four times a day (QID) | ORAL | Status: DC | PRN

## 2014-03-08 NOTE — Telephone Encounter (Signed)
Attempted to call patient. No answer, no machine. Will try again later. Rx placed up front for pick up.

## 2014-03-08 NOTE — Telephone Encounter (Signed)
Rx called in as directed.   

## 2014-03-08 NOTE — Telephone Encounter (Signed)
printed and placed in [facility] box. 

## 2014-03-10 ENCOUNTER — Other Ambulatory Visit: Payer: Self-pay | Admitting: Family Medicine

## 2014-04-07 ENCOUNTER — Other Ambulatory Visit: Payer: Self-pay | Admitting: Family Medicine

## 2014-04-07 MED ORDER — OXYCODONE HCL 10 MG PO TABS: 10.0000 mg | ORAL_TABLET | Freq: Four times a day (QID) | ORAL | Status: DC | PRN

## 2014-04-07 NOTE — Telephone Encounter (Signed)
Printed.  Thanks.  

## 2014-04-07 NOTE — Telephone Encounter (Signed)
Pt left v/m requesting rx oxycodone. Call when ready for pick up. 

## 2014-04-08 NOTE — Telephone Encounter (Signed)
Patient's husband notified that script is up front ready for pickup.

## 2014-04-12 ENCOUNTER — Ambulatory Visit: Payer: Medicare Other | Admitting: Critical Care Medicine

## 2014-04-23 ENCOUNTER — Telehealth: Payer: Self-pay | Admitting: Critical Care Medicine

## 2014-04-23 NOTE — Telephone Encounter (Signed)
Discussed with Katie > okay to add pt in at 2pm on 10/19 Called spoke with patient, informed her of the upcoming appt Pt okay with this date/time Nothing further needded; will sign off

## 2014-04-23 NOTE — Telephone Encounter (Signed)
Called spoke with spouse who reports pt is due for 4 month COPD follow up but does have some increased cough - dry x2 weeks.  Spouse denies any increased SOB, wheezing, purulent sputum, tightness, f/c/s, n/v/d, hemoptysis.  Spouse is requesting appt on 10/19 for pt, would like early afternoon.  Spouse would prefer to see CY as [facility] does not have any openings for 10/19.  Spouse does not feel that rx/treamtent is needed today and that pt will be fine to wait for 10/19.

## 2014-04-26 ENCOUNTER — Encounter: Payer: Self-pay | Admitting: Internal Medicine

## 2014-04-26 ENCOUNTER — Ambulatory Visit: Payer: Medicare Other | Admitting: Critical Care Medicine

## 2014-04-26 ENCOUNTER — Ambulatory Visit (INDEPENDENT_AMBULATORY_CARE_PROVIDER_SITE_OTHER): Payer: Medicare Other | Admitting: Internal Medicine

## 2014-04-26 ENCOUNTER — Ambulatory Visit (INDEPENDENT_AMBULATORY_CARE_PROVIDER_SITE_OTHER)
Admission: RE | Admit: 2014-04-26 | Discharge: 2014-04-26 | Disposition: A | Discharge status: Home / Self Care | Payer: Medicare Other | Source: Ambulatory Visit | Attending: Internal Medicine | Admitting: Internal Medicine

## 2014-04-26 VITALS — BP 114/66 | HR 85 | Ht 62.0 in | Wt 180.6 lb

## 2014-04-26 DIAGNOSIS — J9611 Chronic respiratory failure with hypoxia: Secondary | ICD-10-CM

## 2014-04-26 DIAGNOSIS — R0600 Dyspnea, unspecified: Secondary | ICD-10-CM

## 2014-04-26 DIAGNOSIS — R0609 Other forms of dyspnea: Secondary | ICD-10-CM

## 2014-04-26 DIAGNOSIS — J449 Chronic obstructive pulmonary disease, unspecified: Secondary | ICD-10-CM

## 2014-04-26 DIAGNOSIS — J3089 Other allergic rhinitis: Secondary | ICD-10-CM

## 2014-04-26 DIAGNOSIS — J309 Allergic rhinitis, unspecified: Secondary | ICD-10-CM

## 2014-04-26 DIAGNOSIS — J302 Other seasonal allergic rhinitis: Secondary | ICD-10-CM

## 2014-04-26 DIAGNOSIS — F411 Generalized anxiety disorder: Secondary | ICD-10-CM

## 2014-04-26 MED ORDER — METHYLPREDNISOLONE ACETATE 80 MG/ML IJ SUSP
80.0000 mg | Freq: Once | INTRAMUSCULAR | Status: AC
  Administered 2014-04-26: 80 mg via INTRAMUSCULAR

## 2014-04-26 NOTE — Assessment & Plan Note (Signed)
She is more aware of dyspnea on exertion but it is not clear her exercise capacity has changed. She describes a choking episode which was self-limited and not recurrent. Has had flu shot. Plan-chest x-ray, Depo-Medrol, medication talk. Probably over using rescue inhaler as explained.

## 2014-04-26 NOTE — Progress Notes (Signed)
Patient ID: [name], female    DOB: 10/31/1930, 80 y.o.   MRN: [id number]  HPI  80 y.o. F asthmatic bronchitis  Gold C Copd  12/11/2013 Chief Complaint  Patient presents with  . 6 month follow up    Increased cough x 1-2 months.  Cough is prod with clear to tan.  Has fatigue with any activity.  SOB gradually worsening with activity over the past yr.  No chest tightness/pain.   Since last OV pt had hernia surgery.  No new issues on breathing.   Pt denies any significant sore throat, nasal congestion or excess secretions, fever, chills, sweats, unintended weight loss, pleurtic or exertional chest pain, orthopnea PND, or leg swelling Pt denies any increase in rescue therapy over baseline, denies waking up needing it or having any early am or nocturnal exacerbations of coughing/wheezing/or dyspnea. Pt also denies any obvious fluctuation in symptoms with  weather or environmental change or other alleviating or aggravating factors   This patient continues to use and benefit from her oxygen therapy and has re qualified at this visit for continued oxygen therapy  04/26/14- Acute OV- 77 yoF former smoker followed by Dr Joya Gaskins for [place] with asthmatic bronchitis component and chronic resp failure PW patient-dry cough x 2 weeks; SOB as well.  O2 3L/ [facility]   Has had flu shot Got choked 2 days ago and had trouble catching breath. EMT called but she improved and declined ER. Denies reflux. Using Advair HFA, Spiriva. Using rescue inhaler anytime she gets short of breath during the day-6 or 8 times per day-discussed. Mainly complains of dyspnea only with exertion. "I want some energy, I want to feel good". Patient was intermittently tearful.  Review of Systems  Constitutional:   No  weight loss, night sweats,  Fevers, chills, fatigue, or  lassitude.  HEENT:   No headaches,  Difficulty swallowing,  Tooth/dental problems, or  Sore throat,                No sneezing, itching, ear ache,   +nasal congestion, post nasal drip,   CV:  No chest pain,  Orthopnea, PND, swelling in lower extremities, anasarca, dizziness, palpitations, syncope.   GI  No heartburn, indigestion, abdominal pain, nausea, vomiting, diarrhea, change in bowel habits, loss of appetite, bloody stools.   Resp:    No coughing up of blood.       Skin: no rash or lesions.  GU: no dysuria, change in color of urine, no urgency or frequency.  No flank pain, no hematuria   MS:  No joint pain or swelling.  No decreased range of motion.  No back pain.  Psych:  No change in mood or affect. + depression or anxiety.  No memory loss.     Objective:   Physical Exam BP 120/68  Pulse 72  Ht 5\' 2"  (1.575 m)  Wt 185 lb (83.915 kg)  BMI 33.83 kg/m2  SpO2 94%  GEN: A/Ox3; pleasant , NAD, overweight, O2. Intermittently tearful  HEENT:  [place]/AT,  EACs-clear, TMs-wnl, NOSE-clear drainage , THROAT-clear, no lesions, no postnasal drip or exudate noted.   NECK:  Supple w/ fair ROM; no JVD; normal carotid impulses w/o bruits; no thyromegaly or nodules palpated; no lymphadenopathy.  RESP  -distant breath sounds, unlabored on O2, clear without wheeze or cough  CARD:  RRR, no m/r/g  , no peripheral edema, pulses intact, no cyanosis or clubbing.  GI:   Soft & nt;  nml bowel sounds; no organomegaly or masses detected.  Musco: Warm bil, no deformities or joint swelling noted.   Neuro: alert, no focal deficits noted.    Skin: Warm, no lesions or rashes  No results found.   Gold C Copd with asthmatic bronchitis component and chronic resp failure Stable Gold C Copd and chronic resp failure  Plan No change in inhaled or maintenance medications. Return in  4 months    Updated Medication List Outpatient Encounter Prescriptions as of 12/11/2013  Medication Sig  . albuterol (PROVENTIL HFA;VENTOLIN HFA) 108 (90 BASE) MCG/ACT inhaler Inhale 1-2 puffs into the lungs every 6 (six) hours as needed for wheezing or shortness of  breath.  . ALPRAZolam (XANAX) 1 MG tablet Take 0.5 tablets (0.5 mg total) by mouth 2 (two) times daily as needed for anxiety.  Marland Kitchen amLODipine (NORVASC) 2.5 MG tablet TAKE 1 TABLET BY MOUTH DAILY.  . Calcium Carbonate (CALTRATE 600 PO) Take 1 tablet by mouth daily.    . cetirizine (ZYRTEC) 10 MG tablet Take 10 mg by mouth daily.  . Cholecalciferol (VITAMIN D3) 1000 UNITS CAPS Take 1 capsule by mouth daily.    . diclofenac sodium (VOLTAREN) 1 % GEL Apply 4 g topically 2 (two) times daily.  . DULoxetine (CYMBALTA) 60 MG capsule Take 1 capsule (60 mg total) by mouth daily.  . fluticasone (FLONASE) 50 MCG/ACT nasal spray PLACE 1 SPRAY INTO THE NOSE DAILY.  . fluticasone-salmeterol (ADVAIR HFA) 230-21 MCG/ACT inhaler Inhale 2 puffs into the lungs 2 (two) times daily.  Marland Kitchen levothyroxine (SYNTHROID, LEVOTHROID) 112 MCG tablet Take 1 tablet (112 mcg total) by mouth daily before breakfast.  . Multiple Vitamin (MULTIVITAMIN) capsule Take 1 capsule by mouth daily.    . NON FORMULARY Oxygen Use 3 liters 24/7  . Oxycodone HCl 10 MG TABS Take 1 tablet (10 mg total) by mouth 4 (four) times daily as needed (pain.sedation caution).  . potassium chloride (K-DUR,KLOR-CON) 10 MEQ tablet TAKE 1 TABLET BY MOUTH DAILY.  . ranitidine (ZANTAC) 300 MG tablet Take 300 mg by mouth 2 (two) times daily.  Marland Kitchen tiotropium (SPIRIVA) 18 MCG inhalation capsule Place 1 capsule (18 mcg total) into inhaler and inhale daily.  . [DISCONTINUED] fluticasone (FLONASE) 50 MCG/ACT nasal spray Place 1 spray into both nostrils daily.  . [DISCONTINUED] acetaminophen (TYLENOL) 325 MG tablet Take 2 tablets (650 mg total) by mouth every 6 (six) hours as needed for mild pain, moderate pain or fever.

## 2014-04-26 NOTE — Assessment & Plan Note (Signed)
Denies significant nasal congestion now as early fall season is over

## 2014-04-26 NOTE — Assessment & Plan Note (Signed)
Remains oxygen dependent

## 2014-04-26 NOTE — Patient Instructions (Addendum)
Depo 13  Order- CXR dx dyspnea on exertion  Please call as needed

## 2014-04-26 NOTE — Assessment & Plan Note (Signed)
Alternated between calm and pleasant, and tearful. Said she just wanted some energy, wanted to feel good. I'm not clear that there is an organic change but she is chronically limited by her lung disease. Question if an antidepressant would help her

## 2014-05-04 ENCOUNTER — Other Ambulatory Visit: Payer: Self-pay | Admitting: Family Medicine

## 2014-05-04 NOTE — Telephone Encounter (Signed)
Received refill request electronically from pharmacy. Last office visit 09/25/13. Is it okay to refill medication?

## 2014-05-05 NOTE — Telephone Encounter (Signed)
Sent. Thanks.   

## 2014-05-11 ENCOUNTER — Ambulatory Visit (INDEPENDENT_AMBULATORY_CARE_PROVIDER_SITE_OTHER): Payer: Medicare Other | Admitting: Family Medicine

## 2014-05-11 ENCOUNTER — Encounter: Payer: Self-pay | Admitting: Family Medicine

## 2014-05-11 VITALS — BP 126/78 | HR 74 | Temp 98.1°F | Ht 61.25 in | Wt 178.5 lb

## 2014-05-11 DIAGNOSIS — M179 Osteoarthritis of knee, unspecified: Secondary | ICD-10-CM

## 2014-05-11 DIAGNOSIS — E039 Hypothyroidism, unspecified: Secondary | ICD-10-CM

## 2014-05-11 DIAGNOSIS — J449 Chronic obstructive pulmonary disease, unspecified: Secondary | ICD-10-CM

## 2014-05-11 DIAGNOSIS — G4700 Insomnia, unspecified: Secondary | ICD-10-CM

## 2014-05-11 DIAGNOSIS — M1711 Unilateral primary osteoarthritis, right knee: Secondary | ICD-10-CM

## 2014-05-11 DIAGNOSIS — E042 Nontoxic multinodular goiter: Secondary | ICD-10-CM

## 2014-05-11 DIAGNOSIS — E041 Nontoxic single thyroid nodule: Secondary | ICD-10-CM

## 2014-05-11 DIAGNOSIS — I10 Essential (primary) hypertension: Secondary | ICD-10-CM

## 2014-05-11 LAB — COMPREHENSIVE METABOLIC PANEL
ALT: 17 U/L (ref 0–35)
AST: 31 U/L (ref 0–37)
Albumin: 3.5 g/dL (ref 3.5–5.2)
Alkaline Phosphatase: 60 U/L (ref 39–117)
BUN: 18 mg/dL (ref 6–23)
CO2: 36 mEq/L — ABNORMAL HIGH (ref 19–32)
Calcium: 9.2 mg/dL (ref 8.4–10.5)
Chloride: 99 mEq/L (ref 96–112)
Creatinine, Ser: 0.8 mg/dL (ref 0.4–1.2)
GFR: 73.12 mL/min (ref >60.00)
Glucose, Bld: 87 mg/dL (ref 70–99)
Potassium: 4.1 mEq/L (ref 3.5–5.1)
Sodium: 139 mEq/L (ref 135–145)
Total Bilirubin: 0.7 mg/dL (ref 0.2–1.2)
Total Protein: 7.7 g/dL (ref 6.0–8.3)

## 2014-05-11 LAB — LIPID PANEL
Cholesterol: 220 mg/dL — ABNORMAL HIGH (ref 0–200)
HDL: 57.9 mg/dL (ref >39.00)
LDL Cholesterol: 143 mg/dL — ABNORMAL HIGH (ref 0–99)
NonHDL: 162.1
Total CHOL/HDL Ratio: 4
Triglycerides: 98 mg/dL (ref 0.0–149.0)
VLDL: 19.6 mg/dL (ref 0.0–40.0)

## 2014-05-11 MED ORDER — FLUTICASONE PROPIONATE 50 MCG/ACT NA SUSP
1.0000 | Freq: Every day | NASAL | Status: DC
Start: 2014-05-11 — End: 2015-10-04

## 2014-05-11 MED ORDER — ALPRAZOLAM 1 MG PO TABS: 0.5000 mg | ORAL_TABLET | Freq: Two times a day (BID) | ORAL | Status: DC | PRN

## 2014-05-11 MED ORDER — TRAZODONE HCL 50 MG PO TABS: ORAL_TABLET | ORAL | Status: DC

## 2014-05-11 MED ORDER — LEVOTHYROXINE SODIUM 112 MCG PO TABS: 112.0000 ug | ORAL_TABLET | Freq: Every day | ORAL | Status: DC

## 2014-05-11 MED ORDER — RANITIDINE HCL 150 MG PO TABS: ORAL_TABLET | ORAL | Status: DC

## 2014-05-11 MED ORDER — POTASSIUM CHLORIDE CRYS ER 10 MEQ PO TBCR: EXTENDED_RELEASE_TABLET | ORAL | Status: DC

## 2014-05-11 MED ORDER — OXYCODONE HCL 10 MG PO TABS: 10.0000 mg | ORAL_TABLET | Freq: Four times a day (QID) | ORAL | Status: DC | PRN

## 2014-05-11 MED ORDER — DULOXETINE HCL 60 MG PO CPEP: 60.0000 mg | ORAL_CAPSULE | Freq: Every day | ORAL | Status: DC

## 2014-05-11 NOTE — Patient Instructions (Signed)
Go to the lab on the way out.  We'll contact you with your lab report. Don't change your meds for now except for taking an extra trazodone at night and an extra spray of flonase.  Let me know if that doesn't help. Take care.

## 2014-05-11 NOTE — Progress Notes (Signed)
Pre visit review using our clinic review tool, if applicable. No additional management support is needed unless otherwise documented below in the visit note.  Rhinitis, clear, noted chronically w/o nasal pressure.   Knee shoulder and back pain.  On cymbalta and opioids at baseline w/o ADE on med. She has declined surgery for her joint aches at this point and that is reasonable.  No sedation on meds. Using a stool softener daily.    Insomnia. On trazodone at baseline, complicated by the recent unexpected death of a grandson.  Trazodone had helped prev w/o ADE.   MDD.  Improved with cymbalta.  "If I skip a dose, then I end up crying."  Her mood is clearly improved on the medicine. D/w pt. No ADE.   Hypothyroid.  Due for f/u labs.  TSH wnl, see f/u labs.  She has thyroid nodules, due for f/u later in 2015.  D/w pt.  She is okay with going for the u/s.   Hypertension:    Using medication without problems or lightheadedness: yes Chest pain with exertion:no Edema:no Short of breath: improved recenty, "I'm doing okay" on oxygen.  Meds, vitals, and allergies reviewed.   ROS: See HPI.  Otherwise, noncontributory.  GEN: nad, alert and oriented, elderly, on O2 HEENT: mucous membranes moist NECK: supple w/o LA, no TMG CV: rrr.   PULM: global dec in BS but no focal dec in BS, no inc wob ABD: soft, +bs EXT: no edema SKIN: no acute rash

## 2014-05-12 ENCOUNTER — Telehealth: Payer: Self-pay | Admitting: Family Medicine

## 2014-05-12 LAB — TSH: TSH: 2.61 u[IU]/mL (ref 0.35–4.50)

## 2014-05-12 NOTE — Telephone Encounter (Signed)
emmi mailed  °

## 2014-05-13 NOTE — Assessment & Plan Note (Signed)
Repeat u/s pending.

## 2014-05-13 NOTE — Assessment & Plan Note (Signed)
Inc trazodone to 150mg  qhs.  D/w pt about recent death of grandson and she is trying to work through this.

## 2014-05-13 NOTE — Assessment & Plan Note (Signed)
Continue cymbata and her opioids.  No ADE on meds. Risk/benefits d/w pt.

## 2014-05-13 NOTE — Assessment & Plan Note (Signed)
See notes on labs. F/u u/s pending. D/w pt. She is okay with repeat u/s.

## 2014-05-13 NOTE — Assessment & Plan Note (Signed)
Continue meds as is.  She isn't a good surgical candidate.

## 2014-05-13 NOTE — Assessment & Plan Note (Signed)
continue current meds and O2.

## 2014-05-13 NOTE — Assessment & Plan Note (Signed)
Controlled, see notes on labs.  No change in meds.  

## 2014-05-14 ENCOUNTER — Encounter: Payer: Self-pay | Admitting: Internal Medicine

## 2014-05-27 ENCOUNTER — Ambulatory Visit
Admission: RE | Admit: 2014-05-27 | Discharge: 2014-05-27 | Disposition: A | Discharge status: Home / Self Care | Payer: Medicare Other | Source: Ambulatory Visit | Attending: Family Medicine | Admitting: Family Medicine

## 2014-05-27 DIAGNOSIS — E041 Nontoxic single thyroid nodule: Secondary | ICD-10-CM

## 2014-06-09 ENCOUNTER — Other Ambulatory Visit: Payer: Self-pay | Admitting: Family Medicine

## 2014-06-09 MED ORDER — OXYCODONE HCL 10 MG PO TABS: 10.0000 mg | ORAL_TABLET | Freq: Four times a day (QID) | ORAL | Status: DC | PRN

## 2014-06-09 NOTE — Telephone Encounter (Signed)
Please call  [phone]  When rx is ready to pick up

## 2014-06-09 NOTE — Telephone Encounter (Signed)
Pt called and wants Oxycodone HCl 10 MG TABS [[id number]] refilled.  Pt would a call when ready [phone]/ lt

## 2014-06-09 NOTE — Telephone Encounter (Signed)
Patient notified that script is up front ready for pickup.

## 2014-06-09 NOTE — Telephone Encounter (Signed)
Printed.  Thanks.  

## 2014-06-14 ENCOUNTER — Ambulatory Visit (INDEPENDENT_AMBULATORY_CARE_PROVIDER_SITE_OTHER): Payer: Medicare Other | Admitting: Family Medicine

## 2014-06-14 ENCOUNTER — Telehealth: Payer: Self-pay

## 2014-06-14 ENCOUNTER — Encounter: Payer: Self-pay | Admitting: Family Medicine

## 2014-06-14 VITALS — BP 152/70 | HR 88 | Temp 98.8°F | Wt 175.5 lb

## 2014-06-14 DIAGNOSIS — J449 Chronic obstructive pulmonary disease, unspecified: Secondary | ICD-10-CM

## 2014-06-14 MED ORDER — PREDNISONE 20 MG PO TABS: ORAL_TABLET | ORAL | Status: DC

## 2014-06-14 MED ORDER — LEVOFLOXACIN 500 MG PO TABS: 500.0000 mg | ORAL_TABLET | Freq: Every day | ORAL | Status: DC

## 2014-06-14 NOTE — Progress Notes (Signed)
Pre visit review using our clinic review tool, if applicable. No additional management support is needed unless otherwise documented below in the visit note. pcmh'  Recently with a fever, improved some with tylenol.  Had been sick for the last 4-5 days.  More cough.  No vomiting.  No sputum.  ST, irritated.   No ear pain but did have some rhinorrhea.  No diarrhea.  No rash.   Wheezing more than typical.  More SOB than normal. HA recently noted, frontal.   Meds, vitals, and allergies reviewed.   ROS: See HPI.  Otherwise, noncontributory.  GEN: nad, alert and oriented, on O2 HEENT: mucous membranes moist, tm w/o erythema, nasal exam w/o erythema, clear discharge noted,  OP with cobblestoning NECK: supple w/o LA CV: rrr.   PULM: global but no focal dec in BS, no wheeze today on exam, no inc wob EXT: no edema

## 2014-06-14 NOTE — Telephone Encounter (Signed)
Patient's husband notified as instructed by telephone. Mr. Buster stated that he will try and make arrangements for someone to bring patient for the appointment and will call back and reschedule the if they need to.

## 2014-06-14 NOTE — Telephone Encounter (Signed)
Pt said she started on 06/11/14 with non prod cough, progressed with chest congestion,fever now 100.2,SOB which albuterol has helped. Pt has H/A and S/T. Pt request strong antibiotic and prednisone to [facility]. Pt does not want to schedule appt, pt said she is 60 and cannot ask anyone to bring her to appt. Scheduled appt today at 3:30 with Dr Damita Dunnings which was last available appt. For today. Pt request cb.

## 2014-06-14 NOTE — Telephone Encounter (Signed)
I need to see her at the [facility], especially given her baseline condition. Keep the 3:30.  Thanks.

## 2014-06-14 NOTE — Patient Instructions (Signed)
Start the antibiotics and take the prednisone with food.  That should help. Take care.  Glad to see you.

## 2014-06-15 NOTE — Assessment & Plan Note (Signed)
More cough, more SOB recently.  Would treat given her hx.  Start pred with steroid caution, start levaquin and fu prn.  She agrees. Still okay for outpatient f/u.

## 2014-06-18 ENCOUNTER — Other Ambulatory Visit: Payer: Self-pay | Admitting: Critical Care Medicine

## 2014-06-24 ENCOUNTER — Other Ambulatory Visit: Payer: Self-pay | Admitting: Family Medicine

## 2014-06-24 NOTE — Telephone Encounter (Signed)
Faxed refill request. Please advise.  

## 2014-06-24 NOTE — Telephone Encounter (Addendum)
This needs to be addressed by PCP - will ask him to check chart. If no better may need eval rather than refill meds. If wheezing will recommend urgent evaluation.

## 2014-06-24 NOTE — Telephone Encounter (Signed)
Pt called back in to check on refill. Pt states that she is wheezing pretty bad and needs her refills asap.

## 2014-06-24 NOTE — Telephone Encounter (Signed)
I spoke with Dr. Damita Dunnings on his cell phone.  He asked me to call the patient and see if 1) she had started to improve on the medication but then worsened when the medication ran out or 2) if she never really improved to begin with.  Patient states she had improved some but she is still coughing really bad and now the wheezing is worse than when she was seen in the office.  She is convinced that she just needs a longer course of medication. Dr. Damita Dunnings stated that he was not opposed to the refill if she had improved but needed a longer course of medication.  If you feel more comfortable speaking with Dr. Damita Dunnings, he can be reached on his cell phone but does not have access to his computer.  He is though, familiar with the patient that we are speaking of.

## 2014-06-24 NOTE — Telephone Encounter (Signed)
Refilled per PCP recs

## 2014-06-25 NOTE — Telephone Encounter (Signed)
Noted  

## 2014-06-25 NOTE — Telephone Encounter (Signed)
[name] says she will not be in at 3 pm today.

## 2014-06-25 NOTE — Telephone Encounter (Signed)
Please hold the 3PM slot for now and try to check on her this AM, add her onto the schedule for recheck today if needed.  Thanks.

## 2014-07-05 ENCOUNTER — Other Ambulatory Visit: Payer: Self-pay | Admitting: *Deleted

## 2014-07-05 NOTE — Telephone Encounter (Signed)
rx last written 06/09/14, last uds done in 02/2013. No f/u appt scheduled.

## 2014-07-06 ENCOUNTER — Ambulatory Visit: Payer: Medicare Other | Admitting: Internal Medicine

## 2014-07-06 MED ORDER — OXYCODONE HCL 10 MG PO TABS: 10.0000 mg | ORAL_TABLET | Freq: Four times a day (QID) | ORAL | Status: DC | PRN

## 2014-07-06 NOTE — Telephone Encounter (Signed)
Printed. Fill on/after 07/07/14.

## 2014-07-06 NOTE — Telephone Encounter (Signed)
Husband advised.  Rx left at front desk for pick up.  

## 2014-08-03 ENCOUNTER — Other Ambulatory Visit: Payer: Self-pay | Admitting: Family Medicine

## 2014-08-09 ENCOUNTER — Encounter: Payer: Self-pay | Admitting: Critical Care Medicine

## 2014-08-09 ENCOUNTER — Ambulatory Visit (INDEPENDENT_AMBULATORY_CARE_PROVIDER_SITE_OTHER): Payer: Medicare Other | Admitting: Critical Care Medicine

## 2014-08-09 VITALS — BP 124/68 | HR 105 | Temp 98.0°F | Ht 66.0 in | Wt 176.8 lb

## 2014-08-09 DIAGNOSIS — J449 Chronic obstructive pulmonary disease, unspecified: Secondary | ICD-10-CM

## 2014-08-09 NOTE — Patient Instructions (Signed)
Ok to stay off advair Stay on spiriva daily Return 4 months

## 2014-08-09 NOTE — Progress Notes (Signed)
Patient ID: [name], female    DOB: 1931-01-25, 81 y.o.   MRN: [id number]  HPI  81 y.o. F asthmatic bronchitis  Gold C Copd   08/09/2014 Chief Complaint  Patient presents with  . Follow-up    prod cough w/clear, foamy mucus but states cough is better. SOB at baseline,   Pt had severe cough and now is better.  Now clear mucus.  Dyspnea at baseline.  No chest pain. Pt denies any significant sore throat, nasal congestion or excess secretions, fever, chills, sweats, unintended weight loss, pleurtic or exertional chest pain, orthopnea PND, or leg swelling Pt denies any increase in rescue therapy over baseline, denies waking up needing it or having any early am or nocturnal exacerbations of coughing/wheezing/or dyspnea. Pt also denies any obvious fluctuation in symptoms with  weather or environmental change or other alleviating or aggravating factors  Pt on 3L oxygen continously This patient continues to use and benefit from her oxygen therapy and has re qualified at this visit for continued oxygen therapy   Review of Systems  Constitutional:   No  weight loss, night sweats,  Fevers, chills, fatigue, or  lassitude.  HEENT:   No headaches,  Difficulty swallowing,  Tooth/dental problems, or  Sore throat,                No sneezing, itching, ear ache,  +nasal congestion, post nasal drip,   CV:  No chest pain,  Orthopnea, PND, swelling in lower extremities, anasarca, dizziness, palpitations, syncope.   GI  No heartburn, indigestion, abdominal pain, nausea, vomiting, diarrhea, change in bowel habits, loss of appetite, bloody stools.   Resp:    No coughing up of blood.       Skin: no rash or lesions.  GU: no dysuria, change in color of urine, no urgency or frequency.  No flank pain, no hematuria   MS:  No joint pain or swelling.  No decreased range of motion.  No back pain.  Psych:  No change in mood or affect. + depression or anxiety.  No memory loss.     Objective:     Gold C Copd with asthmatic bronchitis component and chronic resp failure Chronic respiratory failure with COPD Stable at this time Plan Use Advair Maintain Spiriva daily As needed short acting beta agonist Continue oxygen therapy This patient continues to use and benefit from her oxygen therapy and has re qualified at this visit for continued oxygen therapy     Updated Medication List Outpatient Encounter Prescriptions as of 08/09/2014  Medication Sig  . albuterol (PROVENTIL HFA;VENTOLIN HFA) 108 (90 BASE) MCG/ACT inhaler Inhale 1-2 puffs into the lungs every 6 (six) hours as needed for wheezing or shortness of breath.  . ALPRAZolam (XANAX) 1 MG tablet Take 0.5 tablets (0.5 mg total) by mouth 2 (two) times daily as needed for anxiety.  Marland Kitchen amLODipine (NORVASC) 2.5 MG tablet TAKE 1 TABLET BY MOUTH DAILY.  . Calcium Carbonate (CALTRATE 600 PO) Take 1 tablet by mouth daily.    . cetirizine (ZYRTEC) 10 MG tablet Take 10 mg by mouth daily.  . Cholecalciferol (VITAMIN D3) 1000 UNITS CAPS Take 1 capsule by mouth daily.    . DULoxetine (CYMBALTA) 60 MG capsule Take 1 capsule (60 mg total) by mouth daily.  . fluticasone (FLONASE) 50 MCG/ACT nasal spray Place 1-2 sprays into both nostrils daily.  Marland Kitchen levothyroxine (SYNTHROID, LEVOTHROID) 112 MCG tablet Take 1 tablet (112 mcg total) by mouth daily  before breakfast.  . Multiple Vitamin (MULTIVITAMIN) capsule Take 1 capsule by mouth daily.    . NON FORMULARY Oxygen Use 3 liters 24/7  . Oxycodone HCl 10 MG TABS Take 1 tablet (10 mg total) by mouth 4 (four) times daily as needed (pain.sedation caution). Fill on/after 07/07/14.  Marland Kitchen potassium chloride (K-DUR,KLOR-CON) 10 MEQ tablet TAKE 1 TABLET BY MOUTH DAILY.  Marland Kitchen PROAIR HFA 108 (90 BASE) MCG/ACT inhaler INHALE 2 PUFFS INTO THE LUNGS EVERY 4 TO 6 HOURS AS NEEDED.  . ranitidine (ZANTAC) 150 MG tablet TAKE 1 TABLET BY MOUTH TWICE DAILY.  Marland Kitchen SPIRIVA HANDIHALER 18 MCG inhalation capsule PLACE 1 CAPSULE INTO  INHALER AND INHALE ONCE A DAY  . traZODone (DESYREL) 50 MG tablet TAKE 3 TABLETS (150 MG TOTAL) BY MOUTH AT BEDTIME AS NEEDED FOR SLEEP.  . VOLTAREN 1 % GEL APPLY 4 GRAMS TO AFFECTED AREA 2 TIMES A DAY.  . [DISCONTINUED] tiotropium (SPIRIVA) 18 MCG inhalation capsule Place 1 capsule (18 mcg total) into inhaler and inhale daily.  . [DISCONTINUED] ADVAIR HFA 230-21 MCG/ACT inhaler INHALE 2 PUFFS INTO THE LUNGS TWICE A DAY. (Patient not taking: Reported on 08/09/2014)  . [DISCONTINUED] fluticasone-salmeterol (ADVAIR HFA) 230-21 MCG/ACT inhaler Inhale 2 puffs into the lungs 2 (two) times daily. (Patient not taking: Reported on 08/09/2014)  . [DISCONTINUED] levofloxacin (LEVAQUIN) 500 MG tablet TAKE 1 TABLET BY MOUTH DAILY. (Patient not taking: Reported on 08/09/2014)  . [DISCONTINUED] predniSONE (DELTASONE) 20 MG tablet TAKE 2 TABLETS BY MOUTH DAILY FOR 3 DAYS, FOLLOWED BY 1 TABLET DAILY FOR 4 DAYS FOR COPD FLARE. (Patient not taking: Reported on 08/09/2014)

## 2014-08-10 NOTE — Assessment & Plan Note (Signed)
Chronic respiratory failure with COPD Stable at this time Plan Use Advair Maintain Spiriva daily As needed short acting beta agonist Continue oxygen therapy This patient continues to use and benefit from her oxygen therapy and has re qualified at this visit for continued oxygen therapy

## 2014-08-16 ENCOUNTER — Other Ambulatory Visit: Payer: Self-pay | Admitting: Family Medicine

## 2014-08-16 NOTE — Telephone Encounter (Signed)
Pt left v/m requesting rx oxycodone. Call when ready for pick up. Pt last seen 06/14/14.

## 2014-08-17 MED ORDER — OXYCODONE HCL 10 MG PO TABS: 10.0000 mg | ORAL_TABLET | Freq: Four times a day (QID) | ORAL | Status: DC | PRN

## 2014-08-17 MED ORDER — ALPRAZOLAM 1 MG PO TABS: 0.5000 mg | ORAL_TABLET | Freq: Two times a day (BID) | ORAL | Status: DC | PRN

## 2014-08-17 NOTE — Telephone Encounter (Signed)
Both printed. Thanks.  

## 2014-08-17 NOTE — Telephone Encounter (Signed)
Patient advised.  Rx left at front desk for pick up. 

## 2014-08-24 ENCOUNTER — Other Ambulatory Visit: Payer: Self-pay | Admitting: *Deleted

## 2014-08-24 NOTE — Telephone Encounter (Signed)
Faxed refill request. Last Filled:    30 tablet 2 Rf on 08/17/2014  Please advise.

## 2014-09-20 ENCOUNTER — Telehealth: Payer: Self-pay | Admitting: Critical Care Medicine

## 2014-09-20 ENCOUNTER — Other Ambulatory Visit: Payer: Self-pay

## 2014-09-20 MED ORDER — OXYCODONE HCL 10 MG PO TABS: 10.0000 mg | ORAL_TABLET | Freq: Four times a day (QID) | ORAL | Status: DC | PRN

## 2014-09-20 NOTE — Telephone Encounter (Signed)
Patient says that the Advair HFA causes her to cough worse.  She said that anything with HFA after it causes her to get worse instead of better and she wants to get better.  She would like to try something else.  She is currently using Spiriva Handihaler.  I did not see Advair HFA on her med list, however, she said that it is definetly Advair HFA that she is taking.  She said that she is afraid to use the ProAir HFA because she is afraid that will make her worse.    PW - please advise.  Thanks.

## 2014-09-20 NOTE — Telephone Encounter (Signed)
Printed.  Thanks.  

## 2014-09-20 NOTE — Telephone Encounter (Signed)
lmtcb X1 for pt  

## 2014-09-20 NOTE — Telephone Encounter (Signed)
[phone] calling back

## 2014-09-20 NOTE — Telephone Encounter (Signed)
atc pt X2, line rang over 1 minute with no answer.  wcb

## 2014-09-20 NOTE — Telephone Encounter (Signed)
Pt left v/m requesting rx oxycodone. Call when ready for pick up. Pt last seen f/u appt 05/11/14.

## 2014-09-20 NOTE — Telephone Encounter (Signed)
Lets try Breo 100 one puff daily  Not an HFA,  Can give her a sample to try

## 2014-09-20 NOTE — Telephone Encounter (Signed)
Patient notified.  Left sample at front desk for patient to pick up.  Patient will try sample and let us know if it is working well.  If it works, she will call for refill.  Nothing further needed.

## 2014-09-21 NOTE — Telephone Encounter (Signed)
Patient advised.  Rx left at front desk for pick up. 

## 2014-10-04 ENCOUNTER — Telehealth: Payer: Self-pay | Admitting: Critical Care Medicine

## 2014-10-04 MED ORDER — FLUTICASONE FUROATE-VILANTEROL 100-25 MCG/INH IN AEPB
INHALATION_SPRAY | RESPIRATORY_TRACT | Status: DC
Start: 2014-10-04 — End: 2015-02-15

## 2014-10-04 NOTE — Telephone Encounter (Signed)
Pt is aware RX has been sent in. Nothing further needed

## 2014-10-04 NOTE — Telephone Encounter (Signed)
Call in breo 100 one puff daily

## 2014-10-04 NOTE — Telephone Encounter (Signed)
Per 09/20/14 Lorri Frederick, CMA at 09/20/2014 5:21 PM     Status: Signed       Expand All Collapse All   Patient notified. Left sample at front desk for patient to pick up. Patient will try sample and let us know if it is working well. If it works, she will call for refill. Nothing further needed.               Elsie Stain, MD at 09/20/2014 5:15 PM     Status: Signed       Expand All Collapse All   Lets try Breo 100 one puff daily Not an HFA, Can give her a sample to try      ---  Called spoke with pt. She reports breo has been working well for her and wants RX sent in. Please advise PW thanks

## 2014-10-20 ENCOUNTER — Ambulatory Visit (INDEPENDENT_AMBULATORY_CARE_PROVIDER_SITE_OTHER): Payer: Medicare Other | Admitting: Family Medicine

## 2014-10-20 ENCOUNTER — Encounter: Payer: Self-pay | Admitting: Family Medicine

## 2014-10-20 VITALS — BP 128/80 | HR 80 | Temp 98.4°F | Wt 171.0 lb

## 2014-10-20 DIAGNOSIS — R519 Headache, unspecified: Secondary | ICD-10-CM

## 2014-10-20 DIAGNOSIS — R609 Edema, unspecified: Secondary | ICD-10-CM

## 2014-10-20 DIAGNOSIS — R0602 Shortness of breath: Secondary | ICD-10-CM

## 2014-10-20 DIAGNOSIS — G894 Chronic pain syndrome: Secondary | ICD-10-CM

## 2014-10-20 DIAGNOSIS — R51 Headache: Secondary | ICD-10-CM | POA: Diagnosis not present

## 2014-10-20 DIAGNOSIS — J449 Chronic obstructive pulmonary disease, unspecified: Secondary | ICD-10-CM

## 2014-10-20 MED ORDER — OXYCODONE HCL 10 MG PO TABS: 10.0000 mg | ORAL_TABLET | Freq: Four times a day (QID) | ORAL | Status: DC | PRN

## 2014-10-20 NOTE — Patient Instructions (Addendum)
Ask Dr. Joya Gaskins about your Breo/inhaler concerns.  I think he is going to want you to try to take the medicine.  Get back on the zyrtec and see if the rash/itching gets better.  Update me as needed.  Go to the lab on the way out.  We'll contact you with your lab report. We'll make plans about the swelling when I see your labs.   If the headaches come back, then let me know.   If your anxiety is getting worse, then update me.

## 2014-10-20 NOTE — Progress Notes (Addendum)
Pre visit review using our clinic review tool, if applicable. No additional management support is needed unless otherwise documented below in the visit note.  She stopped her breo when she was concerned about potential side effects.  She has been wheezing less recently, but coughing more since stopping it.  She has some SOB at baseline.  I encouraged her to either use the medicine or talk to pulmonary.    She was concerned about a rash from oxycodone.  When she stops the oxycodone, the rash gets better.  It may itch some.  She continues with back and knee pain, with sig trouble walking.  We talked about options, she is off zyrtec now.    She has had more BLE edema recently.  She has urinary frequency at baseline but that appears to be a separate issue.  She has SOB from longstanding COPD, see above.  This isn't clearly worse.   She had R sided HA last week, resolved today.  Rare L sided HA.  H/o migraines 20+ years ago, but this was not at all similar.  She had some pain in the R temporal with the recent HA, but none now.  No sudden vision changes.  As far as the HA is concerned, she is back to baseline.   Meds, vitals, and allergies reviewed.   ROS: See HPI.  Otherwise, noncontributory.  GEN: nad, alert and oriented, on O2 at baseline.   HEENT: mucous membranes moist, TM wnl, OP exam wnl, not ttp in the B temporal areas NECK: supple w/o LA, neck not ttp CV: rrr. PULM: ctab but global dec in BS noted, no inc wob ABD: soft, +bs EXT: trace BLE edema SKIN: no acute rash but she has some mild pinkish areas with some mild excoriation on the arms and legs Noted not to have stridor or lip swelling now or by hx.

## 2014-10-21 DIAGNOSIS — R609 Edema, unspecified: Secondary | ICD-10-CM | POA: Insufficient documentation

## 2014-10-21 DIAGNOSIS — R51 Headache: Secondary | ICD-10-CM

## 2014-10-21 DIAGNOSIS — R519 Headache, unspecified: Secondary | ICD-10-CM | POA: Insufficient documentation

## 2014-10-21 LAB — BASIC METABOLIC PANEL
BUN: 16 mg/dL (ref 6–23)
CO2: 33 mEq/L — ABNORMAL HIGH (ref 19–32)
Calcium: 9.6 mg/dL (ref 8.4–10.5)
Chloride: 102 mEq/L (ref 96–112)
Creatinine, Ser: 0.8 mg/dL (ref 0.40–1.20)
GFR: 73.04 mL/min (ref >60.00)
Glucose, Bld: 95 mg/dL (ref 70–99)
Potassium: 4 mEq/L (ref 3.5–5.1)
Sodium: 140 mEq/L (ref 135–145)

## 2014-10-21 LAB — SEDIMENTATION RATE: Sed Rate: 47 mm/hr — ABNORMAL HIGH (ref 0–22)

## 2014-10-21 LAB — BRAIN NATRIURETIC PEPTIDE: Pro B Natriuretic peptide (BNP): 51 pg/mL (ref 0.0–100.0)

## 2014-10-21 NOTE — Assessment & Plan Note (Signed)
This is an ongoing issue with her back.  S/p injection at pain clinic prev w/o relief.  She likely has itching, but not a true allergy, from opiates.  This could happen with any opiate.  Since the oxycodone has helped her pain, would be reasonable to try getting back on zyrtec and seeing if that helps the itching.  She'll update me as needed.  She agrees.

## 2014-10-21 NOTE — Assessment & Plan Note (Addendum)
Resolved, but we should still check a sed rate given her recent hx and her age.  No other intervention now.  D/w pt about possible false positives with ESR. >25 minutes spent in face to face time with patient, >50% spent in counselling or coordination of care.

## 2014-10-21 NOTE — Assessment & Plan Note (Signed)
D/w pt.  We shouldn't do anything until I see her labs, esp re: renal function.  She agrees.  She may need an occ dose of lasix, but I'm trying to limit this.  D/w pt.  She agrees.  Still okay for outpatient f/u.

## 2014-10-21 NOTE — Assessment & Plan Note (Signed)
I asked her to talk to pulm about her breo, if she isn't will to restart it after our discission today.

## 2014-10-24 ENCOUNTER — Other Ambulatory Visit: Payer: Self-pay | Admitting: Family Medicine

## 2014-10-24 MED ORDER — FUROSEMIDE 20 MG PO TABS: 10.0000 mg | ORAL_TABLET | ORAL | Status: DC

## 2014-10-26 ENCOUNTER — Ambulatory Visit: Payer: Medicare Other | Admitting: Internal Medicine

## 2014-11-08 ENCOUNTER — Telehealth: Payer: Self-pay | Admitting: Family Medicine

## 2014-11-08 DIAGNOSIS — R51 Headache: Principal | ICD-10-CM

## 2014-11-08 DIAGNOSIS — R519 Headache, unspecified: Secondary | ICD-10-CM

## 2014-11-08 NOTE — Telephone Encounter (Signed)
Pt called stating she spoke with dr Damita Dunnings About getting a referral to a neurology For her headaches on right side Pt stated she was told 3 she had 3 mini stokes at hospital

## 2014-11-09 NOTE — Telephone Encounter (Signed)
Ordered. Thanks

## 2014-11-09 NOTE — Telephone Encounter (Signed)
Patient advised.

## 2014-11-16 ENCOUNTER — Other Ambulatory Visit: Payer: Self-pay

## 2014-11-16 NOTE — Telephone Encounter (Signed)
Pt left v/m requesting rx oxycodone. Call when ready for pick up. Pt also wants to get alprazolam increased to 1 tab twice a day. Med list has 1/2 tab twice a day. Have not changed alprazolam on med list until approved by Dr Damita Dunnings.

## 2014-11-16 NOTE — Telephone Encounter (Signed)
[name] pts daughter left v/m requesting refill oxycodone. Call when ready for pick up. Pt last seen and rx last printed 10/20/14.

## 2014-11-17 ENCOUNTER — Ambulatory Visit (INDEPENDENT_AMBULATORY_CARE_PROVIDER_SITE_OTHER): Payer: Medicare Other | Admitting: Neurology

## 2014-11-17 ENCOUNTER — Encounter: Payer: Self-pay | Admitting: Neurology

## 2014-11-17 VITALS — BP 122/76 | HR 82 | Resp 20 | Ht 66.0 in | Wt 173.3 lb

## 2014-11-17 DIAGNOSIS — G8929 Other chronic pain: Secondary | ICD-10-CM

## 2014-11-17 DIAGNOSIS — R51 Headache: Secondary | ICD-10-CM

## 2014-11-17 MED ORDER — OXYCODONE HCL 10 MG PO TABS: 10.0000 mg | ORAL_TABLET | Freq: Four times a day (QID) | ORAL | Status: DC | PRN

## 2014-11-17 MED ORDER — GABAPENTIN 100 MG PO CAPS: ORAL_CAPSULE | ORAL | Status: DC

## 2014-11-17 MED ORDER — ALPRAZOLAM 1 MG PO TABS: 0.5000 mg | ORAL_TABLET | Freq: Three times a day (TID) | ORAL | Status: DC | PRN

## 2014-11-17 NOTE — Patient Instructions (Signed)
Start gabapentin 100mg  capsules.  Take 1 capsule twice daily for 7 days, then 2 capsules twice daily for 7 days, then 3 capsules twice daily Call in 4 weeks with update Follow up in 2 months.

## 2014-11-17 NOTE — Progress Notes (Signed)
NEUROLOGY CONSULTATION NOTE  [name] MRN: [id number] DOB: May 10, 1931  Referring provider: Dr. Damita Dunnings Primary care provider: Dr. Damita Dunnings  Reason for consult:  headache  HISTORY OF PRESENT ILLNESS: [name] is an 81 year old right-handed woman with COPD, chronic pain syndrome and history of TIA who presents for headache.  Records, labs and prior CT of head from 2014 reviewed.  She is accompanied by her husband who provides some history.  She has a remote history of migraines but 2 years ago, she began having a new type of headache.  It is a circumscribed area on the right side of her head, "like a ball."  It is pounding.  It is intermittent and can occur several times a day.  It occurs daily.  There is no associated nausea, photophobia, phonophobia, visual disturbance or autonomic symptoms.  She takes ibuprofen for it.  About twice a week, she also notes a lightening bolt sensation on either side of the back of her head.  She does have neck pain.  She has fibromyalgia and takes oxycodone and Cymbalta 60mg  daily.    Sed Rate from 10/20/14 was 47. CT of head from 06/13/13 showed severe atrophy and chronic small vessel disease.  PAST MEDICAL HISTORY: Past Medical History  Diagnosis Date  . Osteoporosis, unspecified   . Allergic rhinitis, cause unspecified   . Other chronic pain   . Other abnormal blood chemistry   . Insomnia, unspecified   . Need for prophylactic hormone replacement therapy (postmenopausal)   . Degeneration of intervertebral disc, site unspecified   . Allergy, unspecified not elsewhere classified   . Myalgia and myositis, unspecified   . Unspecified essential hypertension   . Other and unspecified hyperlipidemia   . Esophageal reflux   . Diverticulosis of colon (without mention of hemorrhage)   . Unspecified hypothyroidism   . Stroke     mini strokes  . Asthma   . COPD (chronic obstructive pulmonary disease)   . Chronic kidney disease     frequency    . Anxiety   . Osteoarthritis of right knee 08/27/2011    R knee pain, s/p arthroscopy 2012 per [name]   . Headache     PAST SURGICAL HISTORY: Past Surgical History  Procedure Laterality Date  . Breast cystectomy    . Back surgery    . Shoulder surgery    . Knee arthroscopy      Right knee 2012  . Joint replacement      shoulder right  . Total knee arthroplasty  12/11/2011    Procedure: TOTAL KNEE ARTHROPLASTY;  Surgeon: Johnny Bridge, MD;  Location: [facility];  Service: Orthopedics;  Laterality: Right;  . Umbilical hernia repair N/A 06/09/2013    Procedure: EXPLORATIORY LAPAROTOMY, HERNIA REPAIR UMBILICAL, INSERTION OF MESH;  Surgeon: Haywood Lasso, MD;  Location: [facility];  Service: General;  Laterality: N/A;    MEDICATIONS: Current Outpatient Prescriptions on File Prior to Visit  Medication Sig Dispense Refill  . albuterol (PROVENTIL HFA;VENTOLIN HFA) 108 (90 BASE) MCG/ACT inhaler Inhale 1-2 puffs into the lungs every 6 (six) hours as needed for wheezing or shortness of breath.    . ALPRAZolam (XANAX) 1 MG tablet Take 0.5 tablets (0.5 mg total) by mouth 3 (three) times daily as needed for anxiety. 45 tablet 2  . amLODipine (NORVASC) 2.5 MG tablet TAKE 1 TABLET BY MOUTH DAILY. 90 tablet 1  . Calcium Carbonate (CALTRATE 600 PO) Take 1 tablet by mouth daily.      Marland Kitchen  cetirizine (ZYRTEC) 10 MG tablet Take 10 mg by mouth daily.    . Cholecalciferol (VITAMIN D3) 1000 UNITS CAPS Take 1 capsule by mouth daily.      . DULoxetine (CYMBALTA) 60 MG capsule Take 1 capsule (60 mg total) by mouth daily. 90 capsule 3  . fluticasone (FLONASE) 50 MCG/ACT nasal spray Place 1-2 sprays into both nostrils daily. 16 g 12  . Fluticasone Furoate-Vilanterol 100-25 MCG/INH AEPB 1 puff once daily 60 each 6  . furosemide (LASIX) 20 MG tablet Take 0.5-1 tablets (10-20 mg total) by mouth every morning. As needed for swelling.  Use sparingly. 30 tablet 0  . levothyroxine (SYNTHROID, LEVOTHROID) 112 MCG  tablet Take 1 tablet (112 mcg total) by mouth daily before breakfast. 90 tablet 3  . Multiple Vitamin (MULTIVITAMIN) capsule Take 1 capsule by mouth daily.      . Multiple Vitamins-Minerals (OCUVITE PO) Take by mouth daily.    . NON FORMULARY Oxygen Use 3 liters 24/7    . Oxycodone HCl 10 MG TABS Take 1 tablet (10 mg total) by mouth 4 (four) times daily as needed (pain.sedation caution). 120 tablet 0  . potassium chloride (K-DUR,KLOR-CON) 10 MEQ tablet TAKE 1 TABLET BY MOUTH DAILY. 90 tablet 3  . PROAIR HFA 108 (90 BASE) MCG/ACT inhaler INHALE 2 PUFFS INTO THE LUNGS EVERY 4 TO 6 HOURS AS NEEDED. 8.5 g 6  . ranitidine (ZANTAC) 150 MG tablet TAKE 1 TABLET BY MOUTH TWICE DAILY. 180 tablet 3  . SPIRIVA HANDIHALER 18 MCG inhalation capsule PLACE 1 CAPSULE INTO INHALER AND INHALE ONCE A DAY 30 capsule 6  . traZODone (DESYREL) 50 MG tablet TAKE 3 TABLETS (150 MG TOTAL) BY MOUTH AT BEDTIME AS NEEDED FOR SLEEP. 270 tablet 3  . VOLTAREN 1 % GEL APPLY 4 GRAMS TO AFFECTED AREA 2 TIMES A DAY. 200 g 3   No current facility-administered medications on file prior to visit.    ALLERGIES: Allergies  Allergen Reactions  . Doxycycline     GI upset  . Sulfadiazine     REACTION: Fever, achey    FAMILY HISTORY: Family History  Problem Relation Age of Onset  . Lung cancer Brother     x2  . Hypertension Father   . Heart disease Mother   . Asthma Sister     SOCIAL HISTORY: History   Social History  . Marital Status: Married    Spouse Name: N/A  . Number of Children: N/A  . Years of Education: N/A   Occupational History  . Housewife    Social History Main Topics  . Smoking status: Former Smoker -- 1.50 packs/day for 10 years    Types: Cigarettes    Quit date: 07/09/1985  . Smokeless tobacco: Never Used     Comment: 1 1/2 ppd x 20 years  . Alcohol Use: No  . Drug Use: No  . Sexual Activity: No   Other Topics Concern  . Not on file   Social History Narrative    REVIEW OF  SYSTEMS: Constitutional: No fevers, chills, or sweats, no generalized fatigue, change in appetite Eyes: Decreased vision in left eye Ear, nose and throat: No hearing loss, ear pain, nasal congestion, sore throat Cardiovascular: No chest pain, palpitations Respiratory:  No shortness of breath at rest or with exertion, wheezes GastrointestinaI: No nausea, vomiting, diarrhea, abdominal pain, fecal incontinence Genitourinary:  No dysuria, urinary retention or frequency Musculoskeletal:  Generalized pain Integumentary: No rash, pruritus, skin lesions Neurological: as above Psychiatric:  No depression, insomnia, anxiety Endocrine: No palpitations, fatigue, diaphoresis, mood swings, change in appetite, change in weight, increased thirst Hematologic/Lymphatic:  No anemia, purpura, petechiae. Allergic/Immunologic: no itchy/runny eyes, nasal congestion, recent allergic reactions, rashes  PHYSICAL EXAM: Filed Vitals:   11/17/14 1242  BP: 122/76  Pulse: 82  Resp: 20   General: No acute distress Head:  Normocephalic/atraumatic Eyes:  fundi unremarkable, without vessel changes, exudates, hemorrhages or papilledema. Neck: supple, no paraspinal tenderness, full range of motion Back: No paraspinal tenderness Heart: regular rate and rhythm Lungs: Clear to auscultation bilaterally. Vascular: No carotid bruits. Neurological Exam: Mental status: alert and oriented to person, place, and time, recent and remote memory intact, fund of knowledge intact, attention and concentration intact, speech fluent and not dysarthric, language intact. Cranial nerves: CN I: not tested CN II: pupils equal, round and reactive to light, visual fields intact, fundi unremarkable, without vessel changes, exudates, hemorrhages or papilledema. CN III, IV, VI:  full range of motion, no nystagmus, no ptosis CN V: facial sensation intact CN VII: upper and lower face symmetric CN VIII: hearing intact CN IX, X: gag intact, uvula  midline CN XI: sternocleidomastoid and trapezius muscles intact CN XII: tongue midline Bulk & Tone: normal, no fasciculations. Motor:  Cannot test proximal left arm due to shoulder pain.  Otherwise, 5/5 throughout Sensation:  Pinprick and vibration intact Deep Tendon Reflexes:  1+ throughout except absent in ankles.  Toes downgoing. Finger to nose testing:  No dysmetria Heel to shin:  No dysmetria Gait:  Wide-based unsteady. Romberg with sway.  IMPRESSION: Headache, unspecified.  Possible nummular headache.  Possible cervicogenic headache.    Since this has been ongoing for 2 years and her exam is unremarkable, we will hold off on further imaging (such as MRI) and see if she responds to treatment.  PLAN: 1.  Will start gabapentin, starting at 100mg  twice daily and titrating to 300mg  twice daily. 2.  She will call in 4 weeks with update. 3.  Follow up in 2 months.  Thank you for allowing me to take part in the care of this patient.  Metta Clines, DO  CC:  Elsie Stain, MD

## 2014-11-17 NOTE — Telephone Encounter (Signed)
Patient notified by telephone that script is up front ready for pickup. 

## 2014-11-17 NOTE — Telephone Encounter (Signed)
Do not double the xanax. She can take an extra 1/2 tab a day if needed for anxiety, but don't go up by more than that.   rxs printed.   Thanks.

## 2014-12-09 ENCOUNTER — Other Ambulatory Visit: Payer: Self-pay

## 2014-12-09 MED ORDER — OXYCODONE HCL 10 MG PO TABS: 10.0000 mg | ORAL_TABLET | Freq: Four times a day (QID) | ORAL | Status: DC | PRN

## 2014-12-09 NOTE — Telephone Encounter (Signed)
Pt request rx for oxycodone. Call when ready for pick up. Pt seen 10/20/14. rx last filled #[id number] 11/17/14;pt has 2-3 pills left. pt said when counted pills last time was 10 -11 pills short; pt did not know why short and did not contact pharmacy.

## 2014-12-09 NOTE — Telephone Encounter (Signed)
Patient advised.  Rx left at front desk for pick up. 

## 2014-12-09 NOTE — Telephone Encounter (Signed)
I don't know if the pharmacy will fill this rx early.  She needs to manage her meds carefully.  Thanks.

## 2014-12-15 ENCOUNTER — Other Ambulatory Visit: Payer: Self-pay | Admitting: Family Medicine

## 2014-12-15 NOTE — Telephone Encounter (Signed)
Ok to refill in Dr. Josefine Class absence? Last filled 11/17/14.

## 2014-12-15 NOTE — Telephone Encounter (Signed)
plz phone in. 

## 2014-12-16 ENCOUNTER — Other Ambulatory Visit: Payer: Self-pay | Admitting: *Deleted

## 2014-12-16 MED ORDER — ALPRAZOLAM 1 MG PO TABS: ORAL_TABLET | ORAL | Status: DC

## 2014-12-16 NOTE — Telephone Encounter (Signed)
Medication phoned to pharmacy.  

## 2014-12-20 ENCOUNTER — Emergency Department (HOSPITAL_COMMUNITY): Payer: Medicare Other

## 2014-12-20 ENCOUNTER — Telehealth: Payer: Self-pay | Admitting: *Deleted

## 2014-12-20 ENCOUNTER — Emergency Department (HOSPITAL_COMMUNITY)
Admission: EM | Admit: 2014-12-20 | Discharge: 2014-12-20 | Disposition: A | Discharge status: Home / Self Care | Payer: Medicare Other | Attending: Emergency Medicine | Admitting: Emergency Medicine

## 2014-12-20 ENCOUNTER — Encounter (HOSPITAL_COMMUNITY): Payer: Self-pay | Admitting: General Practice

## 2014-12-20 DIAGNOSIS — N189 Chronic kidney disease, unspecified: Secondary | ICD-10-CM | POA: Diagnosis not present

## 2014-12-20 DIAGNOSIS — R531 Weakness: Secondary | ICD-10-CM

## 2014-12-20 DIAGNOSIS — Z79890 Hormone replacement therapy (postmenopausal): Secondary | ICD-10-CM | POA: Insufficient documentation

## 2014-12-20 DIAGNOSIS — Z79899 Other long term (current) drug therapy: Secondary | ICD-10-CM | POA: Diagnosis not present

## 2014-12-20 DIAGNOSIS — N390 Urinary tract infection, site not specified: Secondary | ICD-10-CM | POA: Diagnosis not present

## 2014-12-20 DIAGNOSIS — J449 Chronic obstructive pulmonary disease, unspecified: Secondary | ICD-10-CM | POA: Diagnosis not present

## 2014-12-20 DIAGNOSIS — M179 Osteoarthritis of knee, unspecified: Secondary | ICD-10-CM | POA: Diagnosis not present

## 2014-12-20 DIAGNOSIS — G4700 Insomnia, unspecified: Secondary | ICD-10-CM | POA: Insufficient documentation

## 2014-12-20 DIAGNOSIS — K219 Gastro-esophageal reflux disease without esophagitis: Secondary | ICD-10-CM | POA: Insufficient documentation

## 2014-12-20 DIAGNOSIS — I129 Hypertensive chronic kidney disease with stage 1 through stage 4 chronic kidney disease, or unspecified chronic kidney disease: Secondary | ICD-10-CM | POA: Diagnosis not present

## 2014-12-20 DIAGNOSIS — Z87891 Personal history of nicotine dependence: Secondary | ICD-10-CM | POA: Insufficient documentation

## 2014-12-20 DIAGNOSIS — R5383 Other fatigue: Secondary | ICD-10-CM | POA: Diagnosis present

## 2014-12-20 DIAGNOSIS — E039 Hypothyroidism, unspecified: Secondary | ICD-10-CM | POA: Diagnosis not present

## 2014-12-20 DIAGNOSIS — Z7951 Long term (current) use of inhaled steroids: Secondary | ICD-10-CM | POA: Insufficient documentation

## 2014-12-20 DIAGNOSIS — G8929 Other chronic pain: Secondary | ICD-10-CM | POA: Diagnosis not present

## 2014-12-20 DIAGNOSIS — Z8673 Personal history of transient ischemic attack (TIA), and cerebral infarction without residual deficits: Secondary | ICD-10-CM | POA: Diagnosis not present

## 2014-12-20 DIAGNOSIS — F419 Anxiety disorder, unspecified: Secondary | ICD-10-CM | POA: Diagnosis not present

## 2014-12-20 DIAGNOSIS — R51 Headache: Secondary | ICD-10-CM | POA: Insufficient documentation

## 2014-12-20 LAB — URINALYSIS, ROUTINE W REFLEX MICROSCOPIC
Bilirubin Urine: NEGATIVE
Glucose, UA: NEGATIVE mg/dL
Ketones, ur: NEGATIVE mg/dL
Leukocytes, UA: NEGATIVE
Nitrite: POSITIVE — AB
Protein, ur: NEGATIVE mg/dL
Specific Gravity, Urine: 1.014 (ref 1.005–1.030)
Urobilinogen, UA: 0.2 mg/dL (ref 0.0–1.0)
pH: 5 (ref 5.0–8.0)

## 2014-12-20 LAB — COMPREHENSIVE METABOLIC PANEL
ALT: 12 U/L — ABNORMAL LOW (ref 14–54)
AST: 39 U/L (ref 15–41)
Albumin: 3.4 g/dL — ABNORMAL LOW (ref 3.5–5.0)
Alkaline Phosphatase: 54 U/L (ref 38–126)
Anion gap: 6 (ref 5–15)
BUN: 11 mg/dL (ref 6–20)
CO2: 29 mmol/L (ref 22–32)
Calcium: 9 mg/dL (ref 8.9–10.3)
Chloride: 107 mmol/L (ref 101–111)
Creatinine, Ser: 0.65 mg/dL (ref 0.44–1.00)
GFR calc Af Amer: >60 mL/min (ref >60)
GFR calc non Af Amer: >60 mL/min (ref >60)
Glucose, Bld: 114 mg/dL — ABNORMAL HIGH (ref 65–99)
Potassium: 4.5 mmol/L (ref 3.5–5.1)
Sodium: 142 mmol/L (ref 135–145)
Total Bilirubin: 0.8 mg/dL (ref 0.3–1.2)
Total Protein: 6.9 g/dL (ref 6.5–8.1)

## 2014-12-20 LAB — PROTIME-INR
INR: 1.16 (ref 0.00–1.49)
Prothrombin Time: 15 seconds (ref 11.6–15.2)

## 2014-12-20 LAB — URINE MICROSCOPIC-ADD ON

## 2014-12-20 LAB — ETHANOL: Alcohol, Ethyl (B): <5 mg/dL (ref <5)

## 2014-12-20 LAB — RAPID URINE DRUG SCREEN, HOSP PERFORMED
Amphetamines: POSITIVE — AB
Barbiturates: NOT DETECTED
Benzodiazepines: POSITIVE — AB
Cocaine: NOT DETECTED
Opiates: NOT DETECTED
Tetrahydrocannabinol: NOT DETECTED

## 2014-12-20 LAB — APTT: aPTT: 37 seconds (ref 24–37)

## 2014-12-20 LAB — CBC
HCT: 36.9 % (ref 36.0–46.0)
Hemoglobin: 11.7 g/dL — ABNORMAL LOW (ref 12.0–15.0)
MCH: 28.7 pg (ref 26.0–34.0)
MCHC: 31.7 g/dL (ref 30.0–36.0)
MCV: 90.7 fL (ref 78.0–100.0)
Platelets: 222 10*3/uL (ref 150–400)
RBC: 4.07 MIL/uL (ref 3.87–5.11)
RDW: 15.5 % (ref 11.5–15.5)
WBC: 5.4 10*3/uL (ref 4.0–10.5)

## 2014-12-20 LAB — I-STAT TROPONIN, ED: Troponin i, poc: 0 ng/mL (ref 0.00–0.08)

## 2014-12-20 MED ORDER — CEFTRIAXONE SODIUM 1 G IJ SOLR
1.0000 g | Freq: Once | INTRAMUSCULAR | Status: AC
  Administered 2014-12-20: 1 g via INTRAVENOUS
  Filled 2014-12-20: qty 10

## 2014-12-20 MED ORDER — CEPHALEXIN 500 MG PO CAPS: 500.0000 mg | ORAL_CAPSULE | Freq: Three times a day (TID) | ORAL | Status: DC

## 2014-12-20 NOTE — Discharge Instructions (Signed)
Return to the ED with any concerns including fever/chills, vomiting and not able to keep down liquids, fainting, weakness of arms or legs, changes in vision or speech, decreased level of alertness/lethargy, or any other alarming symptoms

## 2014-12-20 NOTE — ED Provider Notes (Signed)
CSN: [id number]     Arrival date & time 12/20/14  0086 History   First MD Initiated Contact with Patient 12/20/14 1032     Chief Complaint  Patient presents with  . Fatigue  . Headache     (Consider location/radiation/quality/duration/timing/severity/associated sxs/prior Treatment) HPI  Pt presenting with c/o yesterday feeling very weak and fatigued.  She states she was barely able to walk on her walker.  She felt that she wanted to sleep all day.  No fever/chills.  Denies focal weakness, just felt weak all over.  No nausea/vomiting.  Daughter states she saw her mom and she was sitting and leaned over to the side- thought her face was drooping, but couldn't tell since she was leaning against a pillow.  Pt was so weak she was having difficulty eating.  She also been continuing to have chronic headache.  She has seen neurology for this.  Pain is in left side of neck and radiates up into head.  There are no other associated systemic symptoms, there are no other alleviating or modifying factors.   Past Medical History  Diagnosis Date  . Osteoporosis, unspecified   . Allergic rhinitis, cause unspecified   . Other chronic pain   . Other abnormal blood chemistry   . Insomnia, unspecified   . Need for prophylactic hormone replacement therapy (postmenopausal)   . Degeneration of intervertebral disc, site unspecified   . Allergy, unspecified not elsewhere classified   . Myalgia and myositis, unspecified   . Unspecified essential hypertension   . Other and unspecified hyperlipidemia   . Esophageal reflux   . Diverticulosis of colon (without mention of hemorrhage)   . Unspecified hypothyroidism   . Stroke     mini strokes  . Asthma   . COPD (chronic obstructive pulmonary disease)   . Chronic kidney disease     frequency  . Anxiety   . Osteoarthritis of right knee 08/27/2011    R knee pain, s/p arthroscopy 2012 per [name]   . Headache    Past Surgical History  Procedure Laterality  Date  . Breast cystectomy    . Back surgery    . Shoulder surgery    . Knee arthroscopy      Right knee 2012  . Joint replacement      shoulder right  . Total knee arthroplasty  12/11/2011    Procedure: TOTAL KNEE ARTHROPLASTY;  Surgeon: Johnny Bridge, MD;  Location: [facility];  Service: Orthopedics;  Laterality: Right;  . Umbilical hernia repair N/A 06/09/2013    Procedure: EXPLORATIORY LAPAROTOMY, HERNIA REPAIR UMBILICAL, INSERTION OF MESH;  Surgeon: Haywood Lasso, MD;  Location: [facility];  Service: General;  Laterality: N/A;   Family History  Problem Relation Age of Onset  . Lung cancer Brother     x2  . Hypertension Father   . Heart disease Mother   . Asthma Sister    History  Substance Use Topics  . Smoking status: Former Smoker -- 1.50 packs/day for 10 years    Types: Cigarettes    Quit date: 07/09/1985  . Smokeless tobacco: Never Used     Comment: 1 1/2 ppd x 20 years  . Alcohol Use: No   OB History    No data available     Review of Systems  ROS reviewed and all otherwise negative except for mentioned in HPI    Allergies  Doxycycline and Sulfadiazine  Home Medications   Prior to Admission medications   Medication  Sig Start Date End Date Taking? Authorizing Provider  ALPRAZolam Duanne Moron) 1 MG tablet TAKE 1/2 TABLET BY MOUTH 2 TIMES A DAY AS NEEDED FOR ANXIETY. 12/16/14  Yes Tonia Ghent, MD  amLODipine (NORVASC) 2.5 MG tablet TAKE 1 TABLET BY MOUTH DAILY. 08/16/14  Yes Tonia Ghent, MD  Calcium Carbonate (CALTRATE 600 PO) Take 1 tablet by mouth daily.     Yes Historical Provider, MD  cetirizine (ZYRTEC) 10 MG tablet Take 10 mg by mouth daily.   Yes Historical Provider, MD  Cholecalciferol (VITAMIN D3) 1000 UNITS CAPS Take 1 capsule by mouth daily.     Yes Historical Provider, MD  DULoxetine (CYMBALTA) 60 MG capsule Take 1 capsule (60 mg total) by mouth daily. 05/11/14  Yes Tonia Ghent, MD  fluticasone (FLONASE) 50 MCG/ACT nasal spray Place 1-2 sprays into  both nostrils daily. Patient taking differently: Place 1-2 sprays into both nostrils daily as needed for allergies.  05/11/14  Yes Tonia Ghent, MD  Fluticasone Furoate-Vilanterol 100-25 MCG/INH AEPB 1 puff once daily Patient taking differently: Inhale 1 puff into the lungs daily.  10/04/14  Yes Elsie Stain, MD  furosemide (LASIX) 20 MG tablet Take 0.5-1 tablets (10-20 mg total) by mouth every morning. As needed for swelling.  Use sparingly. 10/24/14  Yes Tonia Ghent, MD  gabapentin (NEURONTIN) 100 MG capsule Take 1cap BID x7d, then 2caps BID x7d, then 3caps BID 11/17/14  Yes Adam Telford Nab, DO  ibuprofen (ADVIL,MOTRIN) 200 MG tablet Take 400 mg by mouth every 6 (six) hours as needed for mild pain or moderate pain.   Yes Historical Provider, MD  levothyroxine (SYNTHROID, LEVOTHROID) 112 MCG tablet Take 1 tablet (112 mcg total) by mouth daily before breakfast. 05/11/14  Yes Tonia Ghent, MD  Multiple Vitamin (MULTIVITAMIN) capsule Take 1 capsule by mouth daily.     Yes Historical Provider, MD  NON FORMULARY Oxygen Use 3 liters 24/7   Yes Historical Provider, MD  Oxycodone HCl 10 MG TABS Take 1 tablet (10 mg total) by mouth 4 (four) times daily as needed (pain.sedation caution). 12/09/14  Yes Tonia Ghent, MD  potassium chloride (K-DUR,KLOR-CON) 10 MEQ tablet TAKE 1 TABLET BY MOUTH DAILY. 05/11/14  Yes Tonia Ghent, MD  ranitidine (ZANTAC) 150 MG tablet TAKE 1 TABLET BY MOUTH TWICE DAILY. 05/11/14  Yes Tonia Ghent, MD  SPIRIVA HANDIHALER 18 MCG inhalation capsule PLACE 1 CAPSULE INTO INHALER AND INHALE ONCE A DAY 06/18/14  Yes Elsie Stain, MD  traZODone (DESYREL) 50 MG tablet TAKE 3 TABLETS (150 MG TOTAL) BY MOUTH AT BEDTIME AS NEEDED FOR SLEEP. 05/11/14  Yes Tonia Ghent, MD  VOLTAREN 1 % GEL APPLY 4 GRAMS TO AFFECTED AREA 2 TIMES A DAY.   Yes Tonia Ghent, MD  cephALEXin (KEFLEX) 500 MG capsule Take 1 capsule (500 mg total) by mouth 3 (three) times daily. 12/20/14   Alfonzo Beers, MD   BP 126/98 mmHg  Pulse 86  Temp(Src) 98.3 F (36.8 C) (Oral)  Resp 21  Ht 5\' 1"  (1.549 m)  Wt 169 lb (76.658 kg)  BMI 31.95 kg/m2  SpO2 99%  Vitals reviewed Physical Exam  Physical Examination: General appearance - alert, well appearing, and in no distress Mental status - alert, oriented to person, place, and time Eyes - pupils equal and reactive, extraocular eye movements intact Mouth - mucous membranes moist, pharynx normal without lesions Chest - clear to auscultation, no wheezes, rales or  rhonchi, symmetric air entry Heart - normal rate, regular rhythm, normal S1, S2, no murmurs, rubs, clicks or gallops Abdomen - soft, nontender, nondistended, no masses or organomegaly Neurological - alert, oriented x 3, cranial nerves 2-12 tested and intact, strength 5/ in extremities x 4, sensation intact Extremities - peripheral pulses normal, no pedal edema, no clubbing or cyanosis Skin - normal coloration and turgor, no rashes  ED Course  Procedures (including critical care time) Labs Review Labs Reviewed  CBC - Abnormal; Notable for the following:    Hemoglobin 11.7 (*)    All other components within normal limits  COMPREHENSIVE METABOLIC PANEL - Abnormal; Notable for the following:    Glucose, Bld 114 (*)    Albumin 3.4 (*)    ALT 12 (*)    All other components within normal limits  URINALYSIS, ROUTINE W REFLEX MICROSCOPIC (NOT AT [facility]) - Abnormal; Notable for the following:    APPearance HAZY (*)    Hgb urine dipstick SMALL (*)    Nitrite POSITIVE (*)    All other components within normal limits  URINE RAPID DRUG SCREEN, HOSP PERFORMED - Abnormal; Notable for the following:    Benzodiazepines POSITIVE (*)    Amphetamines POSITIVE (*)    All other components within normal limits  URINE MICROSCOPIC-ADD ON - Abnormal; Notable for the following:    Bacteria, UA MANY (*)    All other components within normal limits  URINE CULTURE  PROTIME-INR  APTT  ETHANOL   I-STAT TROPOININ, ED    Imaging Review No results found.   EKG Interpretation   Date/Time:  Monday December 20 2014 11:11:17 EDT Ventricular Rate:  76 PR Interval:  161 QRS Duration: 87 QT Interval:  445 QTC Calculation: 500 R Axis:   -9 Text Interpretation:  Sinus rhythm Low voltage, extremity leads Probable  anteroseptal infarct, old Prolonged QT interval Since previous tracing  pvcs are no longer present Confirmed by Canary Brim  MD, Raphael Fitzpatrick [phone]) on  12/20/2014 11:45:41 AM      MDM   Final diagnoses:  UTI (lower urinary tract infection)  Generalized weakness    Pt presenting with c/o generalized weakness yesterday, on workup today nothing focal on her neurologic exam- headache is chronic in nature.  CT scan of head shows chronic changes.  Pt found to have UTI- I feel this is the most likely cause of her symptoms.  Pt started on IV rocephin, will start on keflex for home.  Doubt TIA. Or other emergent cause of her symptoms.  Discharged with strict return precautions.  Pt agreeable with plan.    Alfonzo Beers, MD 12/22/14 [phone]

## 2014-12-20 NOTE — ED Notes (Signed)
Patients daughter at bedside. Pts daughter reporting yesterday pt had slurred speech, difficultly eating, confusion, and right sided weakness. Daughter reporting symptoms lasted from noon yesterday to 10 pm last night. Daughter reports patient is better than yesterday but not currentlty at baseline.

## 2014-12-20 NOTE — ED Notes (Signed)
Pt brought in via [facility] with complaints of increasing weakness and low energy levels over the past 2 weeks. Pt also reporting a headache for the past couple of months. Pt has seen a neurologist about a month ago, but still having headaches. Pt is A/O. Pt is neurologically intact. Pt has a history of 3 TIAs, COPD.

## 2014-12-20 NOTE — Telephone Encounter (Signed)
Patients daughter [name] called she states hr mother had an episode over the weekend and she would like for her to possible be seen today. She states she may have had a mini stroke or she doubled up on her medication by accident. Dr Tomi Likens has a 10:30 slot available. Call back number [phone]

## 2014-12-20 NOTE — Telephone Encounter (Signed)
Per Dr Tomi Likens patient was advised to go to ER for a possible stroke patient's daughter agreed she  felt this was best to do

## 2014-12-22 ENCOUNTER — Telehealth (HOSPITAL_BASED_OUTPATIENT_CLINIC_OR_DEPARTMENT_OTHER): Payer: Self-pay | Admitting: Emergency Medicine

## 2014-12-22 LAB — URINE CULTURE: Colony Count: >=100000

## 2014-12-22 NOTE — Telephone Encounter (Signed)
Post ED Visit - Positive Culture Follow-up: Successful Patient Follow-Up  Culture assessed and recommendations reviewed by: [x]  Heide Guile, Pharm.D., BCPS-AQ ID []  Alycia Rossetti, Pharm.D., BCPS []  Sigurd, [place].D., BCPS, AAHIVP []  Legrand Como, Pharm.D., BCPS, AAHIVP []  Tegan Magsam, Pharm.D. []  Milus Glazier, [place].D.  Positive urine  Culture ESBL >100,000 COLONIES  []  Patient discharged without antimicrobial prescription and treatment is now indicated [x]  Organism is resistant to prescribed ED discharge antimicrobial []  Patient with positive blood cultures  Changes discussed with ED provider: Caryl Ada PA New antibiotic prescription stop keflex, start levaquin 250mg  po daily x 7 days Called to [place] [phone]  Contacted patient, 12/22/14 1230   Hazle Nordmann 12/22/2014, 12:25 PM

## 2014-12-22 NOTE — Progress Notes (Signed)
ED Antimicrobial Stewardship Positive Culture Follow Up   [name] is an 81 y.o. female who presented to [facility] on 12/20/2014 with a chief complaint of  Chief Complaint  Patient presents with  . Fatigue  . Headache    Recent Results (from the past 720 hour(s))  Urine culture     Status: None   Collection Time: 12/20/14 12:48 PM  Result Value Ref Range Status   Specimen Description URINE, RANDOM  Final   Special Requests NONE  Final   Colony Count   Final    >=100,000 COLONIES/ML Performed at [facility]    Culture   Final    ESCHERICHIA COLI Note: Confirmed Extended Spectrum Beta-Lactamase Producer (ESBL) CRITICAL RESULT CALLED TO, READ BACK BY AND VERIFIED WITH: LYNN MILLER @ 8:37 AM 12/22/14 BY DWEEKS Performed at [facility]    Report Status 12/22/2014 FINAL  Final   Organism ID, Bacteria ESCHERICHIA COLI  Final      Susceptibility   Escherichia coli - MIC*    AMPICILLIN >=32 RESISTANT Resistant     CEFAZOLIN >=64 RESISTANT Resistant     CEFTRIAXONE >=64 RESISTANT Resistant     CIPROFLOXACIN 2 INTERMEDIATE Intermediate     GENTAMICIN >=16 RESISTANT Resistant     LEVOFLOXACIN 0.5 SENSITIVE Sensitive     NITROFURANTOIN <=16 SENSITIVE Sensitive     TOBRAMYCIN >=16 RESISTANT Resistant     TRIMETH/SULFA >=320 RESISTANT Resistant     IMIPENEM <=0.25 SENSITIVE Sensitive     PIP/TAZO 8 SENSITIVE Sensitive     * ESCHERICHIA COLI    [x]  Treated with cephalexin, organism resistant to prescribed antimicrobial []  Patient discharged originally without antimicrobial agent and treatment is now indicated  New antibiotic prescription: levofloxacin 250mg  po daily for  7 days  ED Provider: Alyse Low, PA-C   Candie Mile 12/22/2014, 10:23 AM Infectious Diseases Pharmacist Phone# [phone]

## 2015-01-11 ENCOUNTER — Other Ambulatory Visit: Payer: Self-pay | Admitting: Family Medicine

## 2015-01-11 ENCOUNTER — Other Ambulatory Visit: Payer: Self-pay | Admitting: Neurology

## 2015-01-11 NOTE — Telephone Encounter (Signed)
Electronic refill request. Last Filled:    30 tablet 0 RF on 12/16/2014  Please advise.

## 2015-01-11 NOTE — Telephone Encounter (Signed)
Pt left v/m requesting rx oxycodone. Call when ready for pick up. Last seen 10/20/14 and rx last printed # 120 on 12/09/14.

## 2015-01-12 ENCOUNTER — Encounter: Payer: Self-pay | Admitting: Family Medicine

## 2015-01-12 MED ORDER — OXYCODONE HCL 10 MG PO TABS: 10.0000 mg | ORAL_TABLET | Freq: Four times a day (QID) | ORAL | Status: DC | PRN

## 2015-01-12 NOTE — Telephone Encounter (Signed)
Please call in.  Thanks.   

## 2015-01-12 NOTE — Telephone Encounter (Signed)
Printed.  Thanks.  

## 2015-01-12 NOTE — Telephone Encounter (Signed)
Noted, thanks!

## 2015-01-12 NOTE — Telephone Encounter (Signed)
Tried to call script in to [facility] and was advised to disregard this request. Pharmacist stated that she called and got this transferred from [facility] because the patient already had a refill left there.

## 2015-01-12 NOTE — Telephone Encounter (Signed)
Spoke to pt and informed her Rx is available for pickup from the front desk;advised third party unable to pickup

## 2015-01-19 ENCOUNTER — Ambulatory Visit: Payer: Medicare Other | Admitting: Neurology

## 2015-01-25 ENCOUNTER — Other Ambulatory Visit: Payer: Self-pay | Admitting: Family Medicine

## 2015-01-25 NOTE — Telephone Encounter (Signed)
Sent. Thanks.   

## 2015-01-25 NOTE — Telephone Encounter (Signed)
Electronic refill request. Last Filled:    200 g 3 RF on 01/21/14  Please advise.

## 2015-02-09 ENCOUNTER — Other Ambulatory Visit: Payer: Self-pay

## 2015-02-09 MED ORDER — OXYCODONE HCL 10 MG PO TABS: 10.0000 mg | ORAL_TABLET | Freq: Four times a day (QID) | ORAL | Status: DC | PRN

## 2015-02-09 NOTE — Telephone Encounter (Signed)
Patient notified by telephone that script is up front ready for pickup. 

## 2015-02-09 NOTE — Telephone Encounter (Signed)
Pt left v/m requesting rx oxycodone. Call when ready for pick up. rx last printed # 120 on 01/12/15 and last seen 10/20/14.

## 2015-02-09 NOTE — Telephone Encounter (Signed)
Printed.  Thanks.  

## 2015-02-15 ENCOUNTER — Encounter: Payer: Self-pay | Admitting: Neurology

## 2015-02-15 ENCOUNTER — Ambulatory Visit (INDEPENDENT_AMBULATORY_CARE_PROVIDER_SITE_OTHER): Payer: Medicare Other | Admitting: Neurology

## 2015-02-15 VITALS — BP 112/78 | HR 84 | Resp 18 | Wt 173.0 lb

## 2015-02-15 DIAGNOSIS — R51 Headache: Secondary | ICD-10-CM

## 2015-02-15 DIAGNOSIS — R413 Other amnesia: Secondary | ICD-10-CM | POA: Diagnosis not present

## 2015-02-15 DIAGNOSIS — R519 Headache, unspecified: Secondary | ICD-10-CM

## 2015-02-15 NOTE — Progress Notes (Addendum)
NEUROLOGY FOLLOW UP OFFICE NOTE  [name] [id number]  HISTORY OF PRESENT ILLNESS: [name] is an 81 year old right-handed woman with COPD, chronic pain syndrome and history of TIA who follows up for unspecified headache, possibly nummular versus cervicogenic.  ED note from June and images of head CT from June reviewed.  She is accompanied by her son who provides some history.  UPDATE: She is taking gabapentin 300mg  twice daily.  Headaches have significantly improved.  She also wants to discuss memory problems.  She has noticed it over the past couple of months.  She mostly notices difficulty recalling names of people, such as old Estate agent or people she has not seen for years.  She denies difficulty recalling names of people she knows well or sees often.  She denies problems remembering to take her medication.  She does not get disoriented when she is outside in town.  She is a Programmer, systems.  In June, she felt weak and fatigued.  She did not have focal weakness but her daughter thought that her face was drooping, but was uncertain.  She went to the ED on 12/20/14, where CT of head showed chronic changes (stable bi-frontal hygromas, atrophy and small vessel disease) but nothing acute.  She was found to have a UTI and was given IV rocephin with prescription for Keflex.  HISTORY: She has a remote history of migraines but 2 years ago, she began having a new type of headache.  It is a circumscribed area on the right side of her head, "like a ball."  It is pounding.  It is intermittent and can occur several times a day.  It occurs daily.  There is no associated nausea, photophobia, phonophobia, visual disturbance or autonomic symptoms.  She takes ibuprofen for it.  About twice a week, she also notes a lightening bolt sensation on either side of the back of her head.  She does have neck pain.  She has fibromyalgia and takes oxycodone and Cymbalta 60mg  daily.    Sed Rate from  10/20/14 was 47. CT of head from 06/13/13 showed severe atrophy and chronic small vessel disease.  PAST MEDICAL HISTORY: Past Medical History  Diagnosis Date  . Osteoporosis, unspecified   . Allergic rhinitis, cause unspecified   . Other chronic pain   . Other abnormal blood chemistry   . Insomnia, unspecified   . Need for prophylactic hormone replacement therapy (postmenopausal)   . Degeneration of intervertebral disc, site unspecified   . Allergy, unspecified not elsewhere classified   . Myalgia and myositis, unspecified   . Unspecified essential hypertension   . Other and unspecified hyperlipidemia   . Esophageal reflux   . Diverticulosis of colon (without mention of hemorrhage)   . Unspecified hypothyroidism   . Stroke     mini strokes  . Asthma   . COPD (chronic obstructive pulmonary disease)   . Chronic kidney disease     frequency  . Anxiety   . Osteoarthritis of right knee 08/27/2011    R knee pain, s/p arthroscopy 2012 per [name]   . Headache     MEDICATIONS: Current Outpatient Prescriptions on File Prior to Visit  Medication Sig Dispense Refill  . ALPRAZolam (XANAX) 1 MG tablet TAKE 1/2 TABLET BY MOUTH 2 TIMES A DAY AS NEEDED. 30 tablet 0  . amLODipine (NORVASC) 2.5 MG tablet TAKE 1 TABLET BY MOUTH DAILY. 90 tablet 1  . Calcium Carbonate (CALTRATE 600 PO) Take 1 tablet  by mouth daily.      . Cholecalciferol (VITAMIN D3) 1000 UNITS CAPS Take 1 capsule by mouth daily.      . DULoxetine (CYMBALTA) 60 MG capsule Take 1 capsule (60 mg total) by mouth daily. 90 capsule 3  . fluticasone (FLONASE) 50 MCG/ACT nasal spray Place 1-2 sprays into both nostrils daily. (Patient taking differently: Place 1-2 sprays into both nostrils daily as needed for allergies. ) 16 g 12  . furosemide (LASIX) 20 MG tablet Take 0.5-1 tablets (10-20 mg total) by mouth every morning. As needed for swelling.  Use sparingly. 30 tablet 0  . gabapentin (NEURONTIN) 100 MG capsule Take 1cap BID x7d,  then 2caps BID x7d, then 3caps BID (Patient taking differently: Takes 3 capsules 3 times a day) 120 capsule 0  . ibuprofen (ADVIL,MOTRIN) 200 MG tablet Take 400 mg by mouth every 6 (six) hours as needed for mild pain or moderate pain.    Marland Kitchen levothyroxine (SYNTHROID, LEVOTHROID) 112 MCG tablet Take 1 tablet (112 mcg total) by mouth daily before breakfast. 90 tablet 3  . Multiple Vitamin (MULTIVITAMIN) capsule Take 1 capsule by mouth daily.      . NON FORMULARY Oxygen Use 3 liters 24/7    . Oxycodone HCl 10 MG TABS Take 1 tablet (10 mg total) by mouth 4 (four) times daily as needed (pain.sedation caution). 120 tablet 0  . potassium chloride (K-DUR,KLOR-CON) 10 MEQ tablet TAKE 1 TABLET BY MOUTH DAILY. 90 tablet 3  . ranitidine (ZANTAC) 150 MG tablet TAKE 1 TABLET BY MOUTH TWICE DAILY. 180 tablet 3  . SPIRIVA HANDIHALER 18 MCG inhalation capsule PLACE 1 CAPSULE INTO INHALER AND INHALE ONCE A DAY 30 capsule 6  . traZODone (DESYREL) 50 MG tablet TAKE 3 TABLETS (150 MG TOTAL) BY MOUTH AT BEDTIME AS NEEDED FOR SLEEP. 270 tablet 3  . VOLTAREN 1 % GEL APPLY 4 GRAMS TO AFFECTED AREA 2 TIMES A DAY. 200 g 3  . cetirizine (ZYRTEC) 10 MG tablet Take 10 mg by mouth daily.     No current facility-administered medications on file prior to visit.    ALLERGIES: Allergies  Allergen Reactions  . Doxycycline     GI upset  . Sulfadiazine     REACTION: Fever, achey    FAMILY HISTORY: Family History  Problem Relation Age of Onset  . Lung cancer Brother     x2  . Hypertension Father   . Heart disease Mother   . Asthma Sister     SOCIAL HISTORY: History   Social History  . Marital Status: Married    Spouse Name: N/A  . Number of Children: N/A  . Years of Education: N/A   Occupational History  . Housewife    Social History Main Topics  . Smoking status: Former Smoker -- 1.50 packs/day for 10 years    Types: Cigarettes    Quit date: 07/09/1985  . Smokeless tobacco: Never Used     Comment: 1  1/2 ppd x 20 years  . Alcohol Use: No  . Drug Use: No  . Sexual Activity: No   Other Topics Concern  . Not on file   Social History Narrative    REVIEW OF SYSTEMS: Constitutional: No fevers, chills, or sweats, no generalized fatigue, change in appetite Eyes: No visual changes, double vision, eye pain Ear, nose and throat: No hearing loss, ear pain, nasal congestion, sore throat Cardiovascular: No chest pain, palpitations Respiratory:  Shortness of breath GastrointestinaI: No nausea, vomiting, diarrhea, abdominal  pain, fecal incontinence Genitourinary:  No dysuria, urinary retention or frequency Musculoskeletal:  No neck pain, back pain Integumentary: No rash, pruritus, skin lesions Neurological: as above Psychiatric: No depression, insomnia, anxiety Endocrine: No palpitations, fatigue, diaphoresis, mood swings, change in appetite, change in weight, increased thirst Hematologic/Lymphatic:  No anemia, purpura, petechiae. Allergic/Immunologic: no itchy/runny eyes, nasal congestion, recent allergic reactions, rashes  PHYSICAL EXAM: Filed Vitals:   02/15/15 1424  BP: 112/78  Pulse: 84  Resp: 18   General: No acute distress.  Patient appears well-groomed.  Wearing nasal cannula with portable O2. Head:  Normocephalic/atraumatic Eyes:  Fundi not visualized on exam. Neck: supple, no paraspinal tenderness, full range of motion Heart:  Regular rate and rhythm Lungs:  Clear to auscultation bilaterally Back: No paraspinal tenderness Neurological Exam: alert and oriented to person, place, and time. Attention span and concentration intact, recalled 2 of 3 words, and remote memory intact, fund of knowledge intact.  Speech fluent and not dysarthric, language intact.   MMSE - Mini Mental State Exam 02/15/2015  Orientation to time 5  Orientation to Place 4  Registration 3  Attention/ Calculation 4  Recall 2  Language- name 2 objects 2  Language- repeat 1  Language- follow 3 step command  3  Language- read & follow direction 1  Write a sentence 1  Copy design 1  Total score 27   CN II-XII intact. Bulk and tone normal, muscle strength 5/5 throughout.  Sensation to light touch, temperature and vibration intact.  Deep tendon reflexes absent.  Finger to nose testing intact.  Gait wide-based.  IMPRESSION: Unspecified headache, possibly cervicogenic versus nummular, improved Memory deficits.  No evidence of cognitive impairment appreciated today  PLAN: 1.  Continue gabapentin 300mg  twice daily 2.  Monitor memory 3.  Follow up in 6 months.  Metta Clines, DO  CC:  Elveria Rising. Damita Dunnings, MD

## 2015-02-15 NOTE — Patient Instructions (Signed)
Continue gabapentin 300mg  twice daily I don't appreciate any memory deficits on my test.  We will retest next visit Follow up in 6 months.

## 2015-02-23 ENCOUNTER — Other Ambulatory Visit: Payer: Self-pay | Admitting: Neurology

## 2015-03-01 ENCOUNTER — Ambulatory Visit: Payer: Medicare Other | Admitting: Neurology

## 2015-03-16 ENCOUNTER — Other Ambulatory Visit: Payer: Self-pay

## 2015-03-16 NOTE — Telephone Encounter (Signed)
Pt left v/m requesting rx for oxycodone. Call when ready for pick up. rx last printed # 120 on 02/09/15. Last seen 10/20/14.

## 2015-03-17 ENCOUNTER — Other Ambulatory Visit: Payer: Self-pay | Admitting: Family Medicine

## 2015-03-17 MED ORDER — OXYCODONE HCL 10 MG PO TABS: 10.0000 mg | ORAL_TABLET | Freq: Four times a day (QID) | ORAL | Status: DC | PRN

## 2015-03-17 MED ORDER — ALPRAZOLAM 1 MG PO TABS: ORAL_TABLET | ORAL | Status: DC

## 2015-03-17 NOTE — Telephone Encounter (Signed)
Printed.  Thanks.  

## 2015-03-17 NOTE — Telephone Encounter (Signed)
Received faxed refill request from [facility] for Alprazolam 1 mg tablet   Dispense: 30  Refill:  1  Last prescribed on 01/12/15. Last seen on 10/20/14. No future appointment.

## 2015-03-17 NOTE — Telephone Encounter (Signed)
Notified patient that Rx was ready for pick up at the front desk.

## 2015-03-17 NOTE — Telephone Encounter (Signed)
Please call in.  Thanks.   

## 2015-03-18 ENCOUNTER — Ambulatory Visit (INDEPENDENT_AMBULATORY_CARE_PROVIDER_SITE_OTHER): Payer: Medicare Other | Admitting: Family Medicine

## 2015-03-18 ENCOUNTER — Encounter: Payer: Self-pay | Admitting: Family Medicine

## 2015-03-18 VITALS — BP 120/70 | HR 72 | Temp 97.9°F | Wt 171.5 lb

## 2015-03-18 DIAGNOSIS — L989 Disorder of the skin and subcutaneous tissue, unspecified: Secondary | ICD-10-CM

## 2015-03-18 MED ORDER — GABAPENTIN 100 MG PO CAPS: ORAL_CAPSULE | ORAL | Status: DC

## 2015-03-18 NOTE — Patient Instructions (Signed)
Rosaria Ferries will call about your referral. Keep the area bandaged. Take care.  Glad to see you.

## 2015-03-18 NOTE — Telephone Encounter (Signed)
Rx called in to pharmacy. 

## 2015-03-18 NOTE — Progress Notes (Signed)
Pre visit review using our clinic review tool, if applicable. No additional management support is needed unless otherwise documented below in the visit note.  L 5th finger with skin open with protruding SQ tissue.  <1cm wide.  Present for about 2 weeks.  Tender to palpation.  Occ bleeds some.  No trauma.  No trigger.   Meds, vitals, and allergies reviewed.   ROS: See HPI.  Otherwise, noncontributory.  nad L hand with L 5th finger with skin open with protruding SQ tissue.  <1cm wide.  On the palmar side of the distal finger. Not bleeding at time of exam.

## 2015-03-19 DIAGNOSIS — L989 Disorder of the skin and subcutaneous tissue, unspecified: Secondary | ICD-10-CM | POA: Insufficient documentation

## 2015-03-19 NOTE — Assessment & Plan Note (Signed)
Looks like a pyogenic granuloma, but I want hand input on excision.  D/w pt.  Refer.  Redressed in the meantime.  Doesn't look infected.  D/w pt.  She agrees with plan.  Play for outpatient f/u.

## 2015-03-25 ENCOUNTER — Other Ambulatory Visit: Payer: Self-pay | Admitting: Orthopedic Surgery

## 2015-04-04 ENCOUNTER — Encounter (HOSPITAL_BASED_OUTPATIENT_CLINIC_OR_DEPARTMENT_OTHER): Payer: Self-pay | Admitting: *Deleted

## 2015-04-04 NOTE — Progress Notes (Signed)
Chart reviewed by Dr Oren Bracket concerning continuous home O2 use at 3L/[place]. States he will discuss local vs iv reg with Dr Fredna Dow.

## 2015-04-06 ENCOUNTER — Encounter (HOSPITAL_BASED_OUTPATIENT_CLINIC_OR_DEPARTMENT_OTHER)
Admission: RE | Admit: 2015-04-06 | Discharge: 2015-04-06 | Disposition: A | Discharge status: Home / Self Care | Payer: Medicare Other | Source: Ambulatory Visit | Attending: Orthopedic Surgery | Admitting: Orthopedic Surgery

## 2015-04-06 DIAGNOSIS — J45909 Unspecified asthma, uncomplicated: Secondary | ICD-10-CM | POA: Diagnosis not present

## 2015-04-06 DIAGNOSIS — J449 Chronic obstructive pulmonary disease, unspecified: Secondary | ICD-10-CM | POA: Diagnosis not present

## 2015-04-06 DIAGNOSIS — N189 Chronic kidney disease, unspecified: Secondary | ICD-10-CM | POA: Diagnosis not present

## 2015-04-06 DIAGNOSIS — L928 Other granulomatous disorders of the skin and subcutaneous tissue: Secondary | ICD-10-CM | POA: Diagnosis present

## 2015-04-06 DIAGNOSIS — Z8673 Personal history of transient ischemic attack (TIA), and cerebral infarction without residual deficits: Secondary | ICD-10-CM | POA: Diagnosis not present

## 2015-04-06 DIAGNOSIS — I129 Hypertensive chronic kidney disease with stage 1 through stage 4 chronic kidney disease, or unspecified chronic kidney disease: Secondary | ICD-10-CM | POA: Diagnosis not present

## 2015-04-06 DIAGNOSIS — L980 Pyogenic granuloma: Secondary | ICD-10-CM | POA: Diagnosis not present

## 2015-04-06 DIAGNOSIS — E039 Hypothyroidism, unspecified: Secondary | ICD-10-CM | POA: Diagnosis not present

## 2015-04-06 DIAGNOSIS — Z87891 Personal history of nicotine dependence: Secondary | ICD-10-CM | POA: Diagnosis not present

## 2015-04-06 LAB — POCT I-STAT, CHEM 8
BUN: 14 mg/dL (ref 6–20)
Calcium, Ion: 1.17 mmol/L (ref 1.13–1.30)
Chloride: 103 mmol/L (ref 101–111)
Creatinine, Ser: 0.7 mg/dL (ref 0.44–1.00)
Glucose, Bld: 94 mg/dL (ref 65–99)
HCT: 39 % (ref 36.0–46.0)
Hemoglobin: 13.3 g/dL (ref 12.0–15.0)
Potassium: 3.8 mmol/L (ref 3.5–5.1)
Sodium: 140 mmol/L (ref 135–145)
TCO2: 27 mmol/L (ref 0–100)

## 2015-04-07 ENCOUNTER — Ambulatory Visit (HOSPITAL_BASED_OUTPATIENT_CLINIC_OR_DEPARTMENT_OTHER): Payer: Medicare Other | Admitting: Anesthesiology

## 2015-04-07 ENCOUNTER — Encounter (HOSPITAL_BASED_OUTPATIENT_CLINIC_OR_DEPARTMENT_OTHER)
Admission: RE | Disposition: A | Discharge status: Home / Self Care | Payer: Self-pay | Source: Ambulatory Visit | Attending: Orthopedic Surgery

## 2015-04-07 ENCOUNTER — Ambulatory Visit (HOSPITAL_BASED_OUTPATIENT_CLINIC_OR_DEPARTMENT_OTHER)
Admission: RE | Admit: 2015-04-07 | Discharge: 2015-04-07 | Disposition: A | Discharge status: Home / Self Care | Payer: Medicare Other | Source: Ambulatory Visit | Attending: Orthopedic Surgery | Admitting: Orthopedic Surgery

## 2015-04-07 ENCOUNTER — Encounter (HOSPITAL_BASED_OUTPATIENT_CLINIC_OR_DEPARTMENT_OTHER): Payer: Self-pay | Admitting: Anesthesiology

## 2015-04-07 DIAGNOSIS — I129 Hypertensive chronic kidney disease with stage 1 through stage 4 chronic kidney disease, or unspecified chronic kidney disease: Secondary | ICD-10-CM | POA: Insufficient documentation

## 2015-04-07 DIAGNOSIS — J449 Chronic obstructive pulmonary disease, unspecified: Secondary | ICD-10-CM | POA: Insufficient documentation

## 2015-04-07 DIAGNOSIS — L980 Pyogenic granuloma: Secondary | ICD-10-CM | POA: Diagnosis not present

## 2015-04-07 DIAGNOSIS — E039 Hypothyroidism, unspecified: Secondary | ICD-10-CM | POA: Insufficient documentation

## 2015-04-07 DIAGNOSIS — J45909 Unspecified asthma, uncomplicated: Secondary | ICD-10-CM | POA: Insufficient documentation

## 2015-04-07 DIAGNOSIS — Z8673 Personal history of transient ischemic attack (TIA), and cerebral infarction without residual deficits: Secondary | ICD-10-CM | POA: Insufficient documentation

## 2015-04-07 DIAGNOSIS — N189 Chronic kidney disease, unspecified: Secondary | ICD-10-CM | POA: Insufficient documentation

## 2015-04-07 DIAGNOSIS — Z87891 Personal history of nicotine dependence: Secondary | ICD-10-CM | POA: Insufficient documentation

## 2015-04-07 HISTORY — PX: MASS EXCISION: SHX2000

## 2015-04-07 SURGERY — MINOR EXCISION OF MASS
Anesthesia: Monitor Anesthesia Care | Site: Hand | Laterality: Left

## 2015-04-07 MED ORDER — MIDAZOLAM HCL 2 MG/2ML IJ SOLN
1.0000 mg | INTRAMUSCULAR | Status: DC | PRN
Start: 2015-04-07 — End: 2015-04-07

## 2015-04-07 MED ORDER — CEFAZOLIN SODIUM-DEXTROSE 2-3 GM-% IV SOLR: 2.0000 g | INTRAVENOUS | Status: DC

## 2015-04-07 MED ORDER — LIDOCAINE HCL (PF) 1 % IJ SOLN
INTRAMUSCULAR | Status: DC | PRN
  Administered 2015-04-07: 5 mL

## 2015-04-07 MED ORDER — FENTANYL CITRATE (PF) 100 MCG/2ML IJ SOLN: 50.0000 ug | INTRAMUSCULAR | Status: DC | PRN

## 2015-04-07 MED ORDER — BUPIVACAINE HCL (PF) 0.25 % IJ SOLN
INTRAMUSCULAR | Status: DC | PRN
  Administered 2015-04-07: 5 mL

## 2015-04-07 MED ORDER — CEPHALEXIN 500 MG PO CAPS: 500.0000 mg | ORAL_CAPSULE | Freq: Three times a day (TID) | ORAL | Status: DC

## 2015-04-07 MED ORDER — CHLORHEXIDINE GLUCONATE 4 % EX LIQD: 60.0000 mL | Freq: Once | CUTANEOUS | Status: DC

## 2015-04-07 MED ORDER — LIDOCAINE HCL (PF) 1 % IJ SOLN
INTRAMUSCULAR | Status: AC
  Filled 2015-04-07: qty 30

## 2015-04-07 MED ORDER — LACTATED RINGERS IV SOLN: INTRAVENOUS | Status: DC

## 2015-04-07 MED ORDER — OXYCODONE HCL 5 MG PO TABS: 5.0000 mg | ORAL_TABLET | Freq: Four times a day (QID) | ORAL | Status: DC | PRN

## 2015-04-07 MED ORDER — GLYCOPYRROLATE 0.2 MG/ML IJ SOLN: 0.2000 mg | Freq: Once | INTRAMUSCULAR | Status: DC | PRN

## 2015-04-07 SURGICAL SUPPLY — 51 items
BANDAGE COBAN STERILE 2 (GAUZE/BANDAGES/DRESSINGS) IMPLANT
BANDAGE ELASTIC 3 VELCRO ST LF (GAUZE/BANDAGES/DRESSINGS) IMPLANT
BENZOIN TINCTURE PRP APPL 2/3 (GAUZE/BANDAGES/DRESSINGS) IMPLANT
BLADE MINI RND TIP GREEN BEAV (BLADE) IMPLANT
BLADE SURG 15 STRL LF DISP TIS (BLADE) ×4 IMPLANT
BLADE SURG 15 STRL SS (BLADE) ×2
BNDG COHESIVE 1X5 TAN STRL LF (GAUZE/BANDAGES/DRESSINGS) ×3 IMPLANT
BNDG CONFORM 2 STRL LF (GAUZE/BANDAGES/DRESSINGS) IMPLANT
BNDG ELASTIC 2 VLCR STRL LF (GAUZE/BANDAGES/DRESSINGS) IMPLANT
BNDG ESMARK 4X9 LF (GAUZE/BANDAGES/DRESSINGS) IMPLANT
BNDG GAUZE 1X2.1 STRL (MISCELLANEOUS) IMPLANT
BNDG GAUZE ELAST 4 BULKY (GAUZE/BANDAGES/DRESSINGS) IMPLANT
BNDG PLASTER X FAST 3X3 WHT LF (CAST SUPPLIES) IMPLANT
CHLORAPREP W/TINT 26ML (MISCELLANEOUS) ×3 IMPLANT
CORDS BIPOLAR (ELECTRODE) ×3 IMPLANT
COVER BACK TABLE 60X90IN (DRAPES) ×3 IMPLANT
COVER MAYO STAND STRL (DRAPES) ×3 IMPLANT
CUFF TOURNIQUET SINGLE 18IN (TOURNIQUET CUFF) IMPLANT
DRAPE EXTREMITY T 121X128X90 (DRAPE) ×3 IMPLANT
DRAPE SURG 17X23 STRL (DRAPES) ×3 IMPLANT
GAUZE SPONGE 4X4 12PLY STRL (GAUZE/BANDAGES/DRESSINGS) ×3 IMPLANT
GAUZE XEROFORM 1X8 LF (GAUZE/BANDAGES/DRESSINGS) ×3 IMPLANT
GLOVE BIO SURGEON STRL SZ 6.5 (GLOVE) ×3 IMPLANT
GLOVE BIO SURGEON STRL SZ7.5 (GLOVE) ×3 IMPLANT
GLOVE BIOGEL PI IND STRL 7.0 (GLOVE) ×2 IMPLANT
GLOVE BIOGEL PI IND STRL 8 (GLOVE) ×2 IMPLANT
GLOVE BIOGEL PI INDICATOR 7.0 (GLOVE) ×1
GLOVE BIOGEL PI INDICATOR 8 (GLOVE) ×1
GOWN STRL REUS W/ TWL LRG LVL3 (GOWN DISPOSABLE) ×2 IMPLANT
GOWN STRL REUS W/TWL LRG LVL3 (GOWN DISPOSABLE) ×1
GOWN STRL REUS W/TWL XL LVL3 (GOWN DISPOSABLE) ×3 IMPLANT
NEEDLE HYPO 25X1 1.5 SAFETY (NEEDLE) ×3 IMPLANT
NS IRRIG 1000ML POUR BTL (IV SOLUTION) ×3 IMPLANT
PACK BASIN DAY SURGERY FS (CUSTOM PROCEDURE TRAY) ×3 IMPLANT
PAD CAST 3X4 CTTN HI CHSV (CAST SUPPLIES) IMPLANT
PAD CAST 4YDX4 CTTN HI CHSV (CAST SUPPLIES) IMPLANT
PADDING CAST ABS 4INX4YD NS (CAST SUPPLIES)
PADDING CAST ABS COTTON 4X4 ST (CAST SUPPLIES) IMPLANT
PADDING CAST COTTON 3X4 STRL (CAST SUPPLIES)
PADDING CAST COTTON 4X4 STRL (CAST SUPPLIES)
STOCKINETTE 4X48 STRL (DRAPES) ×3 IMPLANT
STRIP CLOSURE SKIN 1/2X4 (GAUZE/BANDAGES/DRESSINGS) IMPLANT
SUT ETHILON 3 0 PS 1 (SUTURE) IMPLANT
SUT ETHILON 4 0 PS 2 18 (SUTURE) IMPLANT
SUT ETHILON 5 0 P 3 18 (SUTURE) ×1
SUT NYLON ETHILON 5-0 P-3 1X18 (SUTURE) ×2 IMPLANT
SUT VIC AB 4-0 P2 18 (SUTURE) IMPLANT
SYR BULB 3OZ (MISCELLANEOUS) ×3 IMPLANT
SYR CONTROL 10ML LL (SYRINGE) ×3 IMPLANT
TOWEL OR 17X24 6PK STRL BLUE (TOWEL DISPOSABLE) ×6 IMPLANT
UNDERPAD 30X30 (UNDERPADS AND DIAPERS) ×3 IMPLANT

## 2015-04-07 NOTE — Brief Op Note (Signed)
04/07/2015  12:16 PM  PATIENT:  [name]  81 y.o. female  PRE-OPERATIVE DIAGNOSIS:  LEFT SMALL FINGER PYOGENIC GRANDULOMA  POST-OPERATIVE DIAGNOSIS:  LEFT SMALL FINGER PYOGENIC granuloma  PROCEDURE:  Procedure(s): MINOR EXCISION OF MASS LEFT SMALL FINGER (Left)  SURGEON:  Surgeon(s) and Role:    * Leanora Cover, MD - Primary  PHYSICIAN ASSISTANT:   ASSISTANTS: none   ANESTHESIA:   local  EBL:     BLOOD ADMINISTERED:none  DRAINS: none   LOCAL MEDICATIONS USED:  MARCAINE    and LIDOCAINE   SPECIMEN:  Source of Specimen:  left small finger  DISPOSITION OF SPECIMEN:  PATHOLOGY  COUNTS:  YES  TOURNIQUET:  Penrose drain tourniquet: 10 minutes  DICTATION: .Other Dictation: Dictation Number [id number]  PLAN OF CARE: Discharge to home after PACU  PATIENT DISPOSITION:  PACU - hemodynamically stable.

## 2015-04-07 NOTE — H&P (Signed)
[name] is an 81 y.o. female.   Chief Complaint: left small finger mass HPI: 81 yo rhd female with mass on left small finger x 2 weeks.  Bleeds if not bandaged.  She wishes to have it removed.  Past Medical History  Diagnosis Date  . Osteoporosis, unspecified   . Allergic rhinitis, cause unspecified   . Other chronic pain   . Other abnormal blood chemistry   . Insomnia, unspecified   . Need for prophylactic hormone replacement therapy (postmenopausal)   . Degeneration of intervertebral disc, site unspecified   . Allergy, unspecified not elsewhere classified   . Myalgia and myositis, unspecified   . Unspecified essential hypertension   . Other and unspecified hyperlipidemia   . Esophageal reflux   . Diverticulosis of colon (without mention of hemorrhage)   . Unspecified hypothyroidism   . Stroke     mini strokes  . Asthma   . COPD (chronic obstructive pulmonary disease)   . Chronic kidney disease     frequency  . Anxiety   . Osteoarthritis of right knee 08/27/2011    R knee pain, s/p arthroscopy 2012 per [name]   . Headache     Past Surgical History  Procedure Laterality Date  . Breast cystectomy    . Back surgery    . Shoulder surgery    . Knee arthroscopy      Right knee 2012  . Joint replacement      shoulder right  . Total knee arthroplasty  12/11/2011    Procedure: TOTAL KNEE ARTHROPLASTY;  Surgeon: Johnny Bridge, MD;  Location: [facility];  Service: Orthopedics;  Laterality: Right;  . Umbilical hernia repair N/A 06/09/2013    Procedure: EXPLORATIORY LAPAROTOMY, HERNIA REPAIR UMBILICAL, INSERTION OF MESH;  Surgeon: Haywood Lasso, MD;  Location: [facility];  Service: General;  Laterality: N/A;    Family History  Problem Relation Age of Onset  . Lung cancer Brother     x2  . Hypertension Father   . Heart disease Mother   . Asthma Sister    Social History:  reports that she quit smoking about 29 years ago. Her smoking use included Cigarettes. She has  a 15 pack-year smoking history. She has never used smokeless tobacco. She reports that she does not drink alcohol or use illicit drugs.  Allergies:  Allergies  Allergen Reactions  . Doxycycline     GI upset  . Sulfadiazine     REACTION: Fever, achey    No prescriptions prior to admission    Results for orders placed or performed during the hospital encounter of 04/07/15 (from the past 48 hour(s))  I-STAT, chem 8     Status: None   Collection Time: 04/06/15  3:19 PM  Result Value Ref Range   Sodium 140 135 - 145 mmol/L   Potassium 3.8 3.5 - 5.1 mmol/L   Chloride 103 101 - 111 mmol/L   BUN 14 6 - 20 mg/dL   Creatinine, Ser 0.70 0.44 - 1.00 mg/dL   Glucose, Bld 94 65 - 99 mg/dL   Calcium, Ion 1.17 1.13 - 1.30 mmol/L   TCO2 27 0 - 100 mmol/L   Hemoglobin 13.3 12.0 - 15.0 g/dL   HCT 39.0 36.0 - 46.0 %    No results found.   A comprehensive review of systems was negative except for: Eyes: positive for contacts/glasses Ears, nose, mouth, throat, and face: positive for hearing loss Respiratory: positive for asthma, cough and shortness  of breath Hematologic/lymphatic: positive for easy bruising Musculoskeletal: positive for arthralgias and back pain Neurological: positive for headaches Behavioral/Psych: positive for anxiety and depression  Height 5\' 1"  (1.549 m), weight 77.565 kg (171 lb).  General appearance: alert, cooperative and appears stated age Head: Normocephalic, without obvious abnormality, atraumatic Neck: supple, symmetrical, trachea midline Resp: clear to auscultation bilaterally Cardio: regular rate and rhythm GI: non tender Extremities: intact sensation and capillary refill all digits.  +epl/fpl/io.  mass on left small finger. Pulses: 2+ and symmetric Skin: Skin color, texture, turgor normal. No rashes or lesions Neurologic: Grossly normal Incision/Wound: Small wound at base of left small finger mass  Assessment/Plan Left small finger pyogenic granuloma.   Non operative and operative treatment options were discussed with the patient and patient wishes to proceed with operative treatment. Risks, benefits, and alternatives of surgery were discussed and the patient agrees with the plan of care.   KUZMA,KEVIN R 04/07/2015, 8:40 AM

## 2015-04-07 NOTE — Discharge Instructions (Signed)

## 2015-04-07 NOTE — Anesthesia Preprocedure Evaluation (Deleted)
Anesthesia Evaluation  Patient identified by MRN, date of birth, ID band Patient awake    Reviewed: Allergy & Precautions, H&P , NPO status , Patient's Chart, lab work & pertinent test results  Airway Mallampati: I  TM Distance: >3 FB Neck ROM: Full    Dental   Pulmonary asthma , COPD, former smoker,    breath sounds clear to auscultation       Cardiovascular hypertension, Pt. on medications  Rhythm:Regular     Neuro/Psych Anxiety CVA    GI/Hepatic GERD  Medicated and Controlled,  Endo/Other  Hypothyroidism   Renal/GU Renal disease     Musculoskeletal  (+) Arthritis ,   Abdominal   Peds  Hematology  (+) anemia ,   Anesthesia Other Findings   Reproductive/Obstetrics                             Anesthesia Physical  Anesthesia Plan  ASA: III  Anesthesia Plan: MAC   Post-op Pain Management:    Induction: Intravenous  Airway Management Planned:   Additional Equipment:   Intra-op Plan:   Post-operative Plan:   Informed Consent: I have reviewed the patients History and Physical, chart, labs and discussed the procedure including the risks, benefits and alternatives for the proposed anesthesia with the patient or authorized representative who has indicated his/her understanding and acceptance.   Dental advisory given  Plan Discussed with: CRNA  Anesthesia Plan Comments:         Anesthesia Quick Evaluation

## 2015-04-07 NOTE — Op Note (Signed)
[id number] 

## 2015-04-07 NOTE — Op Note (Addendum)
[name], [name]               ACCOUNT NO.:  [id number]  MEDICAL RECORD NO.:  [id number]  LOCATION:                                 FACILITY:  PHYSICIAN:  Leanora Cover, MD        DATE OF BIRTH:  1931/02/21  DATE OF PROCEDURE:  04/07/2015 DATE OF DISCHARGE:                              OPERATIVE REPORT   PREOPERATIVE DIAGNOSIS:  Left small finger pyogenic granuloma.  POSTOPERATIVE DIAGNOSIS:  Left small finger pyogenic granuloma.  PROCEDURE:  Excision of mass 0.3 cm, left small finger with elliptical skin incision.  SURGEON:  Leanora Cover, MD.  ASSISTANT:  None.  ANESTHESIA:  Digital block with 10 mL of half and half solution of 1% plain lidocaine and 0.25% plain Marcaine.  TOURNIQUET TIME:  Penrose drain for 10 minutes.  DISPOSITION:  Stable.  INDICATIONS FOR PROCEDURE:  The patient is an 81 year old female who has had a mass on her left small finger.  This bleeds easily if she does not keep it covered.  She wished to have it removed.  It has __________ since her initial evaluation in the office.  She still wishes to have it removed.  Risks, benefits, and alternatives of the surgery were discussed including risk of blood loss; infection; damage to nerves, vessels, tendons, ligaments, bone; failure of surgery; need for additional surgery; complications with wound healing, continued pain, and recurrence of mass.  She voiced understanding of these risks and elected to proceed.  OPERATIVE COURSE:  After being identified preoperatively by myself, the patient and I agreed upon the procedure and the site of the procedure. Surgical site was marked.  The risks, benefits, and alternatives of surgery were reviewed.  She wished to proceed.  Surgical consent had been signed.  She was transferred to the operating room and placed on the operating table in supine position with the left upper extremity on arm board.  A surgical pause was performed between surgeon, staff, and the  patient.  All were in agreement as to the patient, procedure, and site of the procedure.  A digital block was performed in the left small finger.  A 10 mL of half and half solution of 1% plain lidocaine and 0.25% plain Marcaine.  This was adequate to give digital anesthesia. The hand was prepped and draped in normal sterile orthopedic fashion. Again, a surgical pause was performed between surgeon, staff, and the patient.  All were in agreement as to the patient, procedure, and site of the procedure.  A Penrose drain was placed in the proximal aspect of the small finger as a tourniquet.  The mass on the padded side of the distal phalanx was ellipsed out.  It was sent to Pathology for examination.  It did not spread underneath the skin.  There was no remaining mass in the subcutaneous tissues.  The wound was copiously irrigated with sterile saline and closed with 5-0 nylon in a horizontal mattress fashion.  It was then dressed with sterile Xeroform, 4 x 4, and wrapped with a Coban dressing lightly.  The Penrose drain was removed at 10 minutes.  Fingertips were pink with brisk capillary refill after completion of the procedure.  The operative drapes were broken down. The patient was transferred back to wheelchair and taken Recovery in stable condition. I will see her back in the office in one week for postoperative followup.  I will give her oxycodone 5 mg one p.o. q.6 hours p.r.n. pain, dispensed #30.  She will use it on top of her regularly scheduled pain medications.     Leanora Cover, MD  04/14/15: [name] to add size of lesion.    KK/MEDQ  D:  04/07/2015  T:  04/07/2015  Job:  [id number]

## 2015-04-11 ENCOUNTER — Encounter (HOSPITAL_BASED_OUTPATIENT_CLINIC_OR_DEPARTMENT_OTHER): Payer: Self-pay | Admitting: Orthopedic Surgery

## 2015-04-14 ENCOUNTER — Other Ambulatory Visit: Payer: Self-pay

## 2015-04-14 NOTE — Telephone Encounter (Signed)
Pt left v/m requesting rx oxycodone 10 mg. Call when ready for pick up. rx last printed # 120 on 03/17/15; last seen 10/20/14 discuss med refills.

## 2015-04-15 ENCOUNTER — Encounter: Payer: Self-pay | Admitting: Family Medicine

## 2015-04-15 MED ORDER — OXYCODONE HCL 10 MG PO TABS: 10.0000 mg | ORAL_TABLET | Freq: Four times a day (QID) | ORAL | Status: DC | PRN

## 2015-04-15 NOTE — Telephone Encounter (Signed)
Printed.  Thanks.  

## 2015-04-15 NOTE — Telephone Encounter (Signed)
Spoke to pt and informed her Rx is available for pickup from the front desk. Advised third party unable to pickup. Pt renewed contract 01/2015 but sample not provided

## 2015-04-22 ENCOUNTER — Ambulatory Visit: Payer: Medicare Other | Admitting: Family Medicine

## 2015-04-22 ENCOUNTER — Telehealth: Payer: Self-pay | Admitting: Family Medicine

## 2015-04-22 DIAGNOSIS — Z0289 Encounter for other administrative examinations: Secondary | ICD-10-CM

## 2015-04-22 NOTE — Telephone Encounter (Signed)
Pt showed up for an 10:45 appointment at 11:30. Dr. Damita Dunnings offered to see her at 3:00PM or see a different provider this afternoon, but pt refused. She said she would call back to reschedule at a later time.

## 2015-05-09 ENCOUNTER — Ambulatory Visit (INDEPENDENT_AMBULATORY_CARE_PROVIDER_SITE_OTHER): Payer: Medicare Other | Admitting: Family Medicine

## 2015-05-09 ENCOUNTER — Encounter: Payer: Self-pay | Admitting: Family Medicine

## 2015-05-09 VITALS — BP 104/60 | HR 76 | Temp 98.4°F | Wt 170.2 lb

## 2015-05-09 DIAGNOSIS — J449 Chronic obstructive pulmonary disease, unspecified: Secondary | ICD-10-CM

## 2015-05-09 MED ORDER — OXYCODONE HCL 10 MG PO TABS: 10.0000 mg | ORAL_TABLET | Freq: Four times a day (QID) | ORAL | Status: DC | PRN

## 2015-05-09 MED ORDER — AZITHROMYCIN 250 MG PO TABS: ORAL_TABLET | ORAL | Status: DC

## 2015-05-09 MED ORDER — GABAPENTIN 300 MG PO CAPS: 300.0000 mg | ORAL_CAPSULE | Freq: Three times a day (TID) | ORAL | Status: DC

## 2015-05-09 NOTE — Patient Instructions (Addendum)
Please call the neurology clinic about your headaches.   Start the antibiotics today and update [place] if not better.  It doesn't appear that you need to go on prednisone at this point.  Take care.  Glad to see you.

## 2015-05-09 NOTE — Progress Notes (Signed)
Pre visit review using our clinic review tool, if applicable. No additional management support is needed unless otherwise documented below in the visit note.  She still has leg pain, but it got much better with gabapentin.  No ADE on med.  No sedation, not dizzy.  Still with some HA, had seen neuro prev.  She asked about changing her rx to 300mg  tabs instead of the 100mg  tabs, w/o a change in her total dose.  This is reasonable.  I asked her to f/u with neurology about her HA.    Cough.  Sputum, whitish or beige.  Cough increased overall, but then some better this AM.  Still on O2.  No fevers.  Had been hoarse recently.  Cough has been going on for about 2 months.  Neck had been sore from the cough but is some better now.  No fevers.  Not more SOB than normal.  Has been able to tolerate time off supplemental O2 when at rest.   She needs refill on xanax called in.    She has flu and shingles shot at pharmacy 05/02/15.   Meds, vitals, and allergies reviewed.   ROS: See HPI.  Otherwise, noncontributory.  GEN: nad, alert and oriented HEENT: mucous membranes moist, OP wnl NECK: supple w/o LA CV: rrr.  no murmur PULM: coarse BS but o/w ctab, no inc wob, no wheeze. On O2 ABD: soft, +bs EXT: no edema SKIN: no acute rash

## 2015-05-12 ENCOUNTER — Encounter: Payer: Self-pay | Admitting: Family Medicine

## 2015-05-12 ENCOUNTER — Other Ambulatory Visit: Payer: Self-pay | Admitting: Family Medicine

## 2015-05-12 MED ORDER — ALPRAZOLAM 1 MG PO TABS: ORAL_TABLET | ORAL | Status: DC

## 2015-05-12 NOTE — Assessment & Plan Note (Signed)
With presumed exacerbation, would start zmax in meantime.  Doesn't appear to need pred.  Okay for outpatient f/u.  She'll update [place] as needed.  She agrees. See AVS. >25 minutes spent in face to face time with patient, >50% spent in counselling or coordination of care.

## 2015-05-12 NOTE — Progress Notes (Signed)
Please call in xanax.  Note fill on/after date.  Thanks.

## 2015-05-12 NOTE — Progress Notes (Signed)
Rx called in as prescribed 

## 2015-05-19 ENCOUNTER — Telehealth: Payer: Self-pay

## 2015-05-19 NOTE — Telephone Encounter (Signed)
Pt left vm pt was seen 05/09/15 and given zpak; pt states continuing with productive cough and now the phlegm is tan colored. Pt request different abx. Left v/m for pt to cb for additional info, fever, SOB, wheezing and name of pharmacy or does pt want to schedule appt.

## 2015-05-20 NOTE — Telephone Encounter (Signed)
Spoke with pt; pt is not running fever,pt is slightly SOB but pt has COPD; pt said controlled with inhaler,no wheezing.[facility]. Pt request stronger abx. Pt request cb.

## 2015-05-20 NOTE — Telephone Encounter (Signed)
If her breathing and symptoms are not better, then she needs to get rechecked.   I didn't send anything in.

## 2015-05-20 NOTE — Telephone Encounter (Signed)
Spoke with patient and advised results   

## 2015-05-24 ENCOUNTER — Other Ambulatory Visit: Payer: Self-pay | Admitting: Family Medicine

## 2015-06-15 ENCOUNTER — Other Ambulatory Visit: Payer: Self-pay

## 2015-06-15 MED ORDER — OXYCODONE HCL 10 MG PO TABS: 10.0000 mg | ORAL_TABLET | Freq: Four times a day (QID) | ORAL | Status: DC | PRN

## 2015-06-15 NOTE — Telephone Encounter (Signed)
Pt left v/m requesting rx oxycodone. Call when ready for pick up.pt last seen and rx last printed # 120 on 05/09/15.

## 2015-06-15 NOTE — Telephone Encounter (Signed)
Printed.  Thanks.  

## 2015-06-16 NOTE — Telephone Encounter (Signed)
Patient advised.  Rx left at front desk for pick up. 

## 2015-06-21 ENCOUNTER — Other Ambulatory Visit: Payer: Self-pay | Admitting: Family Medicine

## 2015-06-21 NOTE — Telephone Encounter (Signed)
Electronic refill request. Last office visit:   05/09/15.   Trazodone Last Filled:   #270 with 3 RF on 05/11/14 Oxycodone Last Filled:   #120 with 0 RF on 06/15/15.  Rx was placed at front desk for pick up and has been picked up by [name] on 06/17/15.  Please advise.

## 2015-06-21 NOTE — Telephone Encounter (Signed)
Trazodone sent.  Oxy denied, too soon.  Thanks.

## 2015-06-27 ENCOUNTER — Telehealth: Payer: Self-pay

## 2015-06-27 NOTE — Telephone Encounter (Signed)
Pt left v/m; pt got oxycodone and now cannot find the bottle anywhere, pt does not know what happened to the bottle; last printed # 120 on 06/15/15. Pt request another rx of oxycodone. Last seen for acute visit 05/09/15.

## 2015-06-28 MED ORDER — OXYCODONE HCL 10 MG PO TABS: 10.0000 mg | ORAL_TABLET | Freq: Four times a day (QID) | ORAL | Status: DC | PRN

## 2015-06-28 NOTE — Telephone Encounter (Signed)
Patient advised.  Rx left at front desk for pick up. 

## 2015-06-28 NOTE — Telephone Encounter (Signed)
Noted. Thanks.

## 2015-06-28 NOTE — Telephone Encounter (Signed)
Colletta Maryland at [facility] calling to verify it is OK with Dr Damita Dunnings to fill the oxycodone rx that pt has brought in to pharmacy; advised stephanie of phone note started on 06/27/15 and Dr Damita Dunnings said it is OK with him to fill rx unless pharmacy knows further information that might affect the filling of med. Colletta Maryland said she believes pt and does not have info to the contrary of what pt has said. Colletta Maryland will document has spoken with our office and fill rx. Pt is to be more careful with her medications.FYI to Dr Damita Dunnings.

## 2015-06-28 NOTE — Telephone Encounter (Signed)
I believe her, but she is going to have to clear this with the pharmacy.  Printed.  She needs to secure her meds.  Thanks.

## 2015-07-04 ENCOUNTER — Other Ambulatory Visit: Payer: Self-pay | Admitting: Family Medicine

## 2015-07-25 ENCOUNTER — Other Ambulatory Visit: Payer: Self-pay | Admitting: Family Medicine

## 2015-08-19 ENCOUNTER — Ambulatory Visit: Payer: Medicare Other | Admitting: Neurology

## 2015-08-23 ENCOUNTER — Ambulatory Visit (INDEPENDENT_AMBULATORY_CARE_PROVIDER_SITE_OTHER): Payer: Medicare Other | Admitting: Family Medicine

## 2015-08-23 ENCOUNTER — Encounter: Payer: Self-pay | Admitting: Family Medicine

## 2015-08-23 VITALS — BP 166/80 | HR 88 | Temp 98.4°F | Wt 158.5 lb

## 2015-08-23 DIAGNOSIS — F411 Generalized anxiety disorder: Secondary | ICD-10-CM

## 2015-08-23 DIAGNOSIS — J9611 Chronic respiratory failure with hypoxia: Secondary | ICD-10-CM | POA: Diagnosis not present

## 2015-08-23 DIAGNOSIS — R5383 Other fatigue: Secondary | ICD-10-CM | POA: Diagnosis not present

## 2015-08-23 DIAGNOSIS — M609 Myositis, unspecified: Secondary | ICD-10-CM

## 2015-08-23 DIAGNOSIS — M791 Myalgia: Secondary | ICD-10-CM | POA: Diagnosis not present

## 2015-08-23 DIAGNOSIS — IMO0001 Reserved for inherently not codable concepts without codable children: Secondary | ICD-10-CM

## 2015-08-23 DIAGNOSIS — Z23 Encounter for immunization: Secondary | ICD-10-CM | POA: Diagnosis not present

## 2015-08-23 DIAGNOSIS — G894 Chronic pain syndrome: Secondary | ICD-10-CM

## 2015-08-23 DIAGNOSIS — M797 Fibromyalgia: Secondary | ICD-10-CM

## 2015-08-23 LAB — CBC WITH DIFFERENTIAL/PLATELET
Basophils Absolute: 0 10*3/uL (ref 0.0–0.1)
Basophils Relative: 0.6 % (ref 0.0–3.0)
Eosinophils Absolute: 0.1 10*3/uL (ref 0.0–0.7)
Eosinophils Relative: 2.1 % (ref 0.0–5.0)
HCT: 38.6 % (ref 36.0–46.0)
Hemoglobin: 12.8 g/dL (ref 12.0–15.0)
Lymphocytes Relative: 36.4 % (ref 12.0–46.0)
Lymphs Abs: 2.1 10*3/uL (ref 0.7–4.0)
MCHC: 33 g/dL (ref 30.0–36.0)
MCV: 91.2 fl (ref 78.0–100.0)
Monocytes Absolute: 0.4 10*3/uL (ref 0.1–1.0)
Monocytes Relative: 7.1 % (ref 3.0–12.0)
Neutro Abs: 3.2 10*3/uL (ref 1.4–7.7)
Neutrophils Relative %: 53.8 % (ref 43.0–77.0)
Platelets: 270 10*3/uL (ref 150.0–400.0)
RBC: 4.23 Mil/uL (ref 3.87–5.11)
RDW: 15.1 % (ref 11.5–15.5)
WBC: 5.9 10*3/uL (ref 4.0–10.5)

## 2015-08-23 LAB — COMPREHENSIVE METABOLIC PANEL
ALT: 9 U/L (ref 0–35)
AST: 24 U/L (ref 0–37)
Albumin: 4.1 g/dL (ref 3.5–5.2)
Alkaline Phosphatase: 51 U/L (ref 39–117)
BUN: 19 mg/dL (ref 6–23)
CO2: 32 mEq/L (ref 19–32)
Calcium: 9.7 mg/dL (ref 8.4–10.5)
Chloride: 103 mEq/L (ref 96–112)
Creatinine, Ser: 0.8 mg/dL (ref 0.40–1.20)
GFR: 72.89 mL/min (ref >60.00)
Glucose, Bld: 105 mg/dL — ABNORMAL HIGH (ref 70–99)
Potassium: 3.7 mEq/L (ref 3.5–5.1)
Sodium: 142 mEq/L (ref 135–145)
Total Bilirubin: 0.6 mg/dL (ref 0.2–1.2)
Total Protein: 7.7 g/dL (ref 6.0–8.3)

## 2015-08-23 LAB — CK: Total CK: 38 U/L (ref 7–177)

## 2015-08-23 LAB — TSH: TSH: 0.33 u[IU]/mL — ABNORMAL LOW (ref 0.35–4.50)

## 2015-08-23 MED ORDER — OXYCODONE HCL 10 MG PO TABS: 10.0000 mg | ORAL_TABLET | Freq: Four times a day (QID) | ORAL | Status: DC | PRN

## 2015-08-23 MED ORDER — ALPRAZOLAM 1 MG PO TABS: ORAL_TABLET | ORAL | Status: DC

## 2015-08-23 NOTE — Patient Instructions (Addendum)
Go to the lab on the way out.  We'll contact you with your lab report. Call pulmonary and ask for an appointment.  Ask them about the breo vs advair use.  Take care. Glad to see you.

## 2015-08-23 NOTE — Progress Notes (Signed)
Pre visit review using our clinic review tool, if applicable. No additional management support is needed unless otherwise documented below in the visit note.  She is persistently fatigued.  She wakes tired, goes to bed tired.   She has had more leg pain, hiccups and GERD sx.   She feels diffusely sore and has a "mental fog", she attributed both to fibromyalgia.   No falls, no fevers.    She has some help at home.  Her husband has been sick, recently back home from rehab/SNF stay.  "I'm still trying to do all the work and I'm under a lot of pressure."  This is a chronic stressor for her, in addition to her own medical concerns.    H/o chronic resp failure.  She continues on O2 for her lung disease.  Not SOB while on O2.  She doesn't do well with exertion off O2.  She wanted to go back on advair, d/w pt about her pulmonary meds in general.  See plan.  She wanted to f/u here and not with pulm re: her illness.    Meds, vitals, and allergies reviewed.   ROS: See HPI.  Otherwise, noncontributory.  Chronically ill woman in NAD on O2 via [place] ncat Mmm OP wnl Neck supple no LA rrr Global dec in BS, occ scattered rhonchi, but no rales, no wheeze abd soft Ext w/o edema Nail beds well perfused.

## 2015-08-24 NOTE — Assessment & Plan Note (Signed)
She was willing to schedule an appointment with Dr. Annamaria Boots.  I want his input on possible change to advair discus.  I didn't change her meds.   I am willing to see patient for her pulmonary sx if okay with pulmonary and if I can get specific help from pulmonary in the meantime re: her meds.  Continue as is for now.  >25 minutes spent in face to face time with patient, >50% spent in counselling or coordination of care.

## 2015-08-24 NOTE — Assessment & Plan Note (Signed)
Reasonable control with BZD, continue as is.

## 2015-08-24 NOTE — Assessment & Plan Note (Signed)
Not sedated. Reasonable control with opiates w/o ADE, continue as is.  rx done.  She agrees.

## 2015-08-24 NOTE — Assessment & Plan Note (Signed)
Likely contributes to her sx, but I wouldn't change her meds at this point.  D/w pt.  Would check routine labs given her fatigue. See notes on labs.

## 2015-08-25 ENCOUNTER — Telehealth: Payer: Self-pay | Admitting: *Deleted

## 2015-08-25 ENCOUNTER — Other Ambulatory Visit: Payer: Self-pay | Admitting: Family Medicine

## 2015-08-25 MED ORDER — GABAPENTIN 300 MG PO CAPS: 600.0000 mg | ORAL_CAPSULE | Freq: Two times a day (BID) | ORAL | Status: DC

## 2015-08-25 NOTE — Telephone Encounter (Signed)
No answer, no VM available.

## 2015-08-25 NOTE — Telephone Encounter (Signed)
She needs to be seen.  This was not discussed at OV 2 days ago.

## 2015-08-25 NOTE — Telephone Encounter (Signed)
Call was placed to patient to give instructions to increase Gabapentin and states she has a really bad sore throat and a cough that feels like her insides are ripping out.  Patient requests something to be called in.  I advised that we normally do not call in antibiotics without seeing the patient.  Patient states she was seen earlier this week.  Please advise.

## 2015-08-25 NOTE — Telephone Encounter (Signed)
Patient advised.

## 2015-09-14 ENCOUNTER — Encounter: Payer: Self-pay | Admitting: Pulmonary Disease

## 2015-09-14 ENCOUNTER — Ambulatory Visit (INDEPENDENT_AMBULATORY_CARE_PROVIDER_SITE_OTHER): Payer: Medicare Other | Admitting: Pulmonary Disease

## 2015-09-14 VITALS — BP 124/68 | HR 78 | Ht 66.0 in | Wt 155.0 lb

## 2015-09-14 DIAGNOSIS — J449 Chronic obstructive pulmonary disease, unspecified: Secondary | ICD-10-CM | POA: Diagnosis not present

## 2015-09-14 NOTE — Patient Instructions (Signed)
Please stop Breo elipta We will start you on Advair disc 500/50. You also get a prescription for Singulair and scheduled for PFTs.  Return in 3 months.

## 2015-09-14 NOTE — Progress Notes (Signed)
Subjective:    Patient ID: [name], female    DOB: [birth year], 82 y.o.   MRN: [id number]  HPI Follow-up for COPD  [name] is a 82 year old with Gold class C COPD, on 3 LPM Home O2. She is a former patient of Dr. Joya Gaskins. She is currently maintained on Spiriva and Breo elipta. She says that the [facility] makes her cough and is not using it regularly.  She's had cough for the past few weeks. She was seen at a walk-in clinic and had a chest x-ray 1 week ago. This is reportedly normal as per the patient. She was given antibiotics (pencillin), cough medication. This has improved her symptoms but she still has some persistent cough. She denies any fevers, chills, dyspnea, wheezing.  Social History: Quit smoking in 1987. no alcohol, drug use.  Family History: Father-hypertension Mother-heart disease Siblings-asthma, lung cancer  Past Medical History  Diagnosis Date  . Osteoporosis, unspecified   . Allergic rhinitis, cause unspecified   . Other chronic pain   . Other abnormal blood chemistry   . Insomnia, unspecified   . Need for prophylactic hormone replacement therapy (postmenopausal)   . Degeneration of intervertebral disc, site unspecified   . Allergy, unspecified not elsewhere classified   . Myalgia and myositis, unspecified   . Unspecified essential hypertension   . Other and unspecified hyperlipidemia   . Esophageal reflux   . Diverticulosis of colon (without mention of hemorrhage)   . Unspecified hypothyroidism   . Stroke [facility])     mini strokes  . Asthma   . COPD (chronic obstructive pulmonary disease) ([place])   . Chronic kidney disease     frequency  . Anxiety   . Osteoarthritis of right knee 08/27/2011    R knee pain, s/p arthroscopy 2012 per [name]   . Headache     Current outpatient prescriptions:  .  albuterol (PROVENTIL HFA;VENTOLIN HFA) 108 (90 BASE) MCG/ACT inhaler, Inhale into the lungs every 6 (six) hours as needed for wheezing or shortness of  breath., Disp: , Rfl:  .  ALPRAZolam (XANAX) 1 MG tablet, TAKE 1/2 TABLET BY MOUTH 2 TIMES A DAY AS NEEDED., Disp: 30 tablet, Rfl: 2 .  amLODipine (NORVASC) 2.5 MG tablet, TAKE 1 TABLET BY MOUTH DAILY., Disp: 90 tablet, Rfl: 1 .  Calcium Carbonate (CALTRATE 600 PO), Take 1 tablet by mouth daily.  , Disp: , Rfl:  .  Cholecalciferol (VITAMIN D3) 1000 UNITS CAPS, Take 1 capsule by mouth daily.  , Disp: , Rfl:  .  DULoxetine (CYMBALTA) 60 MG capsule, TAKE 1 CAPSULE BY MOUTH DAILY., Disp: 90 capsule, Rfl: 1 .  fluticasone (FLONASE) 50 MCG/ACT nasal spray, Place 1-2 sprays into both nostrils daily. (Patient taking differently: Place 1-2 sprays into both nostrils daily as needed for allergies. ), Disp: 16 g, Rfl: 12 .  Fluticasone Furoate-Vilanterol (BREO ELLIPTA IN), Inhale into the lungs daily., Disp: , Rfl:  .  furosemide (LASIX) 20 MG tablet, TAKE 1/2 TO 1 TABLET BY MOUTH EVERY MORNING AS NEEDED FOR SWELLING. USE SPARINGLY., Disp: 30 tablet, Rfl: 0 .  gabapentin (NEURONTIN) 300 MG capsule, Take 2 capsules (600 mg total) by mouth 2 (two) times daily., Disp: , Rfl:  .  ibuprofen (ADVIL,MOTRIN) 200 MG tablet, Take 400 mg by mouth every 6 (six) hours as needed for mild pain or moderate pain., Disp: , Rfl:  .  levothyroxine (SYNTHROID, LEVOTHROID) 112 MCG tablet, TAKE 1 TABLET BY MOUTH DAILY BEFORE BREAKFAST., Disp: 90  tablet, Rfl: 1 .  Multiple Vitamin (MULTIVITAMIN) capsule, Take 1 capsule by mouth daily.  , Disp: , Rfl:  .  NON FORMULARY, Oxygen Use 3 liters 24/7, Disp: , Rfl:  .  Oxycodone HCl 10 MG TABS, Take 1 tablet (10 mg total) by mouth 4 (four) times daily as needed (pain.sedation caution)., Disp: 120 tablet, Rfl: 0 .  potassium chloride (K-DUR) 10 MEQ tablet, TAKE 1 TABLET BY MOUTH DAILY., Disp: 90 tablet, Rfl: 3 .  ranitidine (ZANTAC) 150 MG tablet, TAKE 1 TABLET BY MOUTH TWICE DAILY., Disp: 180 tablet, Rfl: 3 .  SPIRIVA HANDIHALER 18 MCG inhalation capsule, PLACE 1 CAPSULE INTO INHALER AND  INHALE ONCE A DAY, Disp: 30 capsule, Rfl: 6 .  traZODone (DESYREL) 50 MG tablet, TAKE 3 TABLETS (150 MG TOTAL) BY MOUTH AT BEDTIME AS NEEDED FOR SLEEP., Disp: 270 tablet, Rfl: 3 .  VOLTAREN 1 % GEL, APPLY 4 GRAMS TO AFFECTED AREA 2 TIMES A DAY., Disp: 200 g, Rfl: 3  Review of Systems Cough. No sputum production, dyspnea, wheezing, hemoptysis. No fevers, chills. No nausea, vomiting, diarrhea, constipation. All other review of systems are negative    Objective:   Physical Exam Blood pressure 124/68, pulse 78, height 5\' 6"  (1.676 m), weight 155 lb (70.308 kg), SpO2 97 %. Gen: No apparent distress Neuro: No gross focal deficits. Neck: No JVD, lymphadenopathy, thyromegaly. RS: Clear, No wheeze or crackles CVS: S1-S2 heard, no murmurs rubs gallops. Abdomen: Soft, positive bowel sounds. Extremities: No edema.    Assessment & Plan:  Follow-up for COPD. She'll continue to use the Spiriva. She is not tolerating the Breo because of cough. I will switch her back to Advair which she has tolerated in the past. She also has significant allergy, postnasal drip, GERD which is likely contributing to her symptoms and cough. She is already on Flonase and Pepcid. She'll continue this and I'll add Singulair to help with allergies. She'll be scheduled for pulmonary function test to get a recent assessment of her lung function. She'll continue her home oxygen.  Plan: - Continue spiriva - Change breo to advair 500/50 - Start Singulair - PFTs.  Marshell Garfinkel MD [facility] Pager [phone] If no answer or after 3pm call: [phone] 09/14/2015, 4:46 PM

## 2015-09-15 ENCOUNTER — Telehealth: Payer: Self-pay | Admitting: Pulmonary Disease

## 2015-09-15 MED ORDER — MONTELUKAST SODIUM 10 MG PO TABS: 10.0000 mg | ORAL_TABLET | Freq: Every day | ORAL | Status: DC

## 2015-09-15 NOTE — Telephone Encounter (Signed)
Rx sent and pt aware to [facility]. Nothing more needed at this time.

## 2015-10-04 ENCOUNTER — Other Ambulatory Visit: Payer: Self-pay | Admitting: Family Medicine

## 2015-10-04 ENCOUNTER — Other Ambulatory Visit: Payer: Self-pay

## 2015-10-04 NOTE — Telephone Encounter (Signed)
Received refill requests for Spiriva and Albuterol. It looks like the Spiriva was last done 06-18-14 #30/5, but shows active on her med list. Pharmacy says last refilled 04-26-15. Her last OV was 08-23-15. No new OV scheduled

## 2015-10-04 NOTE — Telephone Encounter (Signed)
Pt request refill oxycodone. Call when ready for pick up. Pt last seen and rx last printed # 120 on 08/23/15.

## 2015-10-05 MED ORDER — ALBUTEROL SULFATE HFA 108 (90 BASE) MCG/ACT IN AERS: 1.0000 | INHALATION_SPRAY | Freq: Four times a day (QID) | RESPIRATORY_TRACT | Status: DC | PRN

## 2015-10-05 MED ORDER — TIOTROPIUM BROMIDE MONOHYDRATE 18 MCG IN CAPS: ORAL_CAPSULE | RESPIRATORY_TRACT | Status: DC

## 2015-10-05 MED ORDER — OXYCODONE HCL 10 MG PO TABS: 10.0000 mg | ORAL_TABLET | Freq: Four times a day (QID) | ORAL | Status: DC | PRN

## 2015-10-05 NOTE — Telephone Encounter (Signed)
Patient's husband (DPR) notified by telephone that script is up front ready for pickup and other were sent to the pharmacy.

## 2015-10-05 NOTE — Telephone Encounter (Signed)
Oxycodone printed, the others sent.  She may have had other leftover inhalers/rxs from prev.  My understanding was that she was on these meds.   Thanks.

## 2015-10-24 ENCOUNTER — Other Ambulatory Visit: Payer: Self-pay | Admitting: Family Medicine

## 2015-10-24 NOTE — Telephone Encounter (Signed)
Electronic refill request. Last Filled:   08/25/2015  ? Quantity.  Last office visit:   08/23/15  Please advise.

## 2015-10-25 ENCOUNTER — Telehealth: Payer: Self-pay | Admitting: *Deleted

## 2015-10-25 NOTE — Telephone Encounter (Signed)
Sent. Thanks.   

## 2015-10-25 NOTE — Telephone Encounter (Signed)
Faxed form received for Portable Oxygen Concentrator.  Form placed in Dr. Buckner [place].

## 2015-10-26 NOTE — Telephone Encounter (Signed)
I'll work on the hard copy.  Thanks.  

## 2015-11-08 ENCOUNTER — Other Ambulatory Visit: Payer: Self-pay

## 2015-11-08 MED ORDER — OXYCODONE HCL 10 MG PO TABS: 10.0000 mg | ORAL_TABLET | Freq: Four times a day (QID) | ORAL | Status: DC | PRN

## 2015-11-08 NOTE — Telephone Encounter (Signed)
Patient advised.  Rx left at front desk for pick up. 

## 2015-11-08 NOTE — Telephone Encounter (Signed)
Printed.  Thanks.  

## 2015-11-08 NOTE — Telephone Encounter (Signed)
Pt left v/m requesting rx oxycodone. Call when ready for pick up. Last printed # 120 on 10/05/15; pt last seen 08/23/15.

## 2015-12-07 ENCOUNTER — Telehealth: Payer: Self-pay

## 2015-12-07 NOTE — Telephone Encounter (Signed)
Patient is on the list for [employer] 2017 and might be a good candidate for an AWV in 2017.

## 2015-12-13 ENCOUNTER — Other Ambulatory Visit: Payer: Self-pay | Admitting: *Deleted

## 2015-12-13 NOTE — Telephone Encounter (Signed)
Attempted to reach pt to schedule AWV and CPE. Unable to leave msg.

## 2015-12-13 NOTE — Telephone Encounter (Signed)
Patient left a voicemail requesting a refill on Oxycodone Last refill 11/08/15 #120 Last office visit 08/23/15 Call when ready for pickup

## 2015-12-14 MED ORDER — OXYCODONE HCL 10 MG PO TABS: 10.0000 mg | ORAL_TABLET | Freq: Four times a day (QID) | ORAL | Status: DC | PRN

## 2015-12-14 NOTE — Telephone Encounter (Signed)
Patient notified by telephone that script is up front ready for pickup. 

## 2015-12-14 NOTE — Telephone Encounter (Signed)
Printed.  Pick up later today when I can sign it.  Thanks.

## 2015-12-24 ENCOUNTER — Other Ambulatory Visit: Payer: Self-pay | Admitting: Family Medicine

## 2015-12-26 NOTE — Telephone Encounter (Signed)
Dr. Damita Dunnings out of the office this week, last filled on 08/23/15 #30 with 2 additional refills, please advise

## 2015-12-26 NOTE — Telephone Encounter (Signed)
plz phone in. 

## 2015-12-26 NOTE — Telephone Encounter (Signed)
Rx called in as directed.   

## 2016-01-11 ENCOUNTER — Other Ambulatory Visit: Payer: Self-pay | Admitting: Family Medicine

## 2016-01-18 ENCOUNTER — Telehealth: Payer: Self-pay

## 2016-01-18 NOTE — Telephone Encounter (Signed)
Pt called requesting a refill of oxycodone. Last written 12-14-15 Last OV 08-23-15 No Future OV. Call when ready [phone]

## 2016-01-19 MED ORDER — OXYCODONE HCL 10 MG PO TABS: 10.0000 mg | ORAL_TABLET | Freq: Four times a day (QID) | ORAL | Status: DC | PRN

## 2016-01-19 NOTE — Telephone Encounter (Signed)
Patient advised.  Rx left at front desk for pick up. 

## 2016-01-19 NOTE — Telephone Encounter (Signed)
Printed.  Thanks.  

## 2016-01-23 ENCOUNTER — Other Ambulatory Visit: Payer: Self-pay | Admitting: Family Medicine

## 2016-02-21 ENCOUNTER — Other Ambulatory Visit: Payer: Self-pay | Admitting: Family Medicine

## 2016-02-21 NOTE — Telephone Encounter (Signed)
Sent. Thanks.   

## 2016-02-21 NOTE — Telephone Encounter (Signed)
Electronic refill request. Last Filled:    90 capsule 2 10/25/2015  Last office visit:   08/23/15  Please advise.

## 2016-02-22 ENCOUNTER — Other Ambulatory Visit: Payer: Self-pay

## 2016-02-22 NOTE — Telephone Encounter (Signed)
Pt left v/m requesting rx for oxycodone. Call when ready for pick up. Last printed # 120 on 01/19/16; pt last seen 08/23/15.Please advise.

## 2016-02-23 MED ORDER — OXYCODONE HCL 10 MG PO TABS: 10.0000 mg | ORAL_TABLET | Freq: Four times a day (QID) | ORAL | 0 refills | Status: DC | PRN

## 2016-02-23 NOTE — Telephone Encounter (Signed)
Printed.  Thanks.  

## 2016-02-23 NOTE — Telephone Encounter (Signed)
Husband advised.  Rx left at front desk for pick up.  

## 2016-03-26 ENCOUNTER — Other Ambulatory Visit: Payer: Self-pay

## 2016-03-26 MED ORDER — OXYCODONE HCL 10 MG PO TABS: 10.0000 mg | ORAL_TABLET | Freq: Four times a day (QID) | ORAL | 0 refills | Status: DC | PRN

## 2016-03-26 NOTE — Telephone Encounter (Signed)
Pt left v/m requesting rx oxycodone. Call when ready for pick up . Last printed # 120 on 02/23/16. Last seen 08/23/15.

## 2016-03-26 NOTE — Telephone Encounter (Signed)
Husband advised.  Rx left at front desk for pick up.  

## 2016-03-26 NOTE — Telephone Encounter (Signed)
Printed.  Thanks.  

## 2016-04-10 ENCOUNTER — Other Ambulatory Visit: Payer: Self-pay | Admitting: *Deleted

## 2016-04-10 NOTE — Telephone Encounter (Signed)
Ok to refill? Last filled 12/26/15 #30 2 RF

## 2016-04-11 MED ORDER — ALPRAZOLAM 1 MG PO TABS: ORAL_TABLET | ORAL | 2 refills | Status: DC

## 2016-04-11 NOTE — Telephone Encounter (Signed)
Rx called to pharmacy as instructed. 

## 2016-04-11 NOTE — Telephone Encounter (Signed)
Please call in.  Thanks.   

## 2016-04-25 ENCOUNTER — Other Ambulatory Visit: Payer: Self-pay

## 2016-04-25 NOTE — Telephone Encounter (Signed)
Pt left v/m requesting rx oxycodone. Call when ready for pick up . Last printed # 120 on 03/26/16.last seen 08/23/15.

## 2016-04-26 MED ORDER — OXYCODONE HCL 10 MG PO TABS: 10.0000 mg | ORAL_TABLET | Freq: Four times a day (QID) | ORAL | 0 refills | Status: DC | PRN

## 2016-04-26 NOTE — Telephone Encounter (Signed)
Printed

## 2016-04-26 NOTE — Telephone Encounter (Signed)
Left detailed message on voicemail. Rx left at front desk for pick up.  

## 2016-05-28 ENCOUNTER — Other Ambulatory Visit: Payer: Self-pay

## 2016-05-28 MED ORDER — OXYCODONE HCL 10 MG PO TABS: 10.0000 mg | ORAL_TABLET | Freq: Four times a day (QID) | ORAL | 0 refills | Status: DC | PRN

## 2016-05-28 NOTE — Telephone Encounter (Signed)
Pt left v/m requesting rx oxycodone. Call when ready for pick up. Last printed # 120 on 04/26/16. Last seen 08/23/15.

## 2016-05-28 NOTE — Telephone Encounter (Signed)
Printed.  Thanks.  

## 2016-05-29 NOTE — Telephone Encounter (Signed)
Patient advised.  Rx left at front desk for pick up. 

## 2016-06-14 ENCOUNTER — Ambulatory Visit: Payer: Medicare Other | Admitting: Family Medicine

## 2016-06-14 DIAGNOSIS — Z0289 Encounter for other administrative examinations: Secondary | ICD-10-CM

## 2016-06-15 ENCOUNTER — Telehealth: Payer: Self-pay | Admitting: Family Medicine

## 2016-06-15 NOTE — Telephone Encounter (Signed)
Patient did not come in for their appointment 12/71/17 for cough, low fever   Please let me know if patient needs to be contacted immediately for follow up or no follow up needed.

## 2016-07-05 ENCOUNTER — Other Ambulatory Visit: Payer: Self-pay

## 2016-07-05 MED ORDER — OXYCODONE HCL 10 MG PO TABS: 10.0000 mg | ORAL_TABLET | Freq: Four times a day (QID) | ORAL | 0 refills | Status: DC | PRN

## 2016-07-05 NOTE — Telephone Encounter (Signed)
Pt left v/m requesting rx oxycodone. Call when ready for pick up. Last printed # 120 on 05/28/16; pt last seen 08/23/15.

## 2016-07-05 NOTE — Telephone Encounter (Signed)
Printed.  Thanks.  Due for 30 min f/u OV when possible.

## 2016-07-06 NOTE — Telephone Encounter (Signed)
Patient advised. Rx left at front desk for pick up. Note made on Rx envelope to schedule 30 min OV.

## 2016-07-16 ENCOUNTER — Telehealth: Payer: Self-pay | Admitting: Family Medicine

## 2016-07-16 NOTE — Telephone Encounter (Signed)
Called pt to explain the $50 no show fee.  She states she was sick and did not remember making the appointment, but she is going to pay the fee.

## 2016-07-16 NOTE — Telephone Encounter (Signed)
-----   Message from Camillia Herter sent at 07/13/2016 10:33 AM EST ----- Regarding: No Show Charge Contact: [phone] Vaughan Basta, Patient received a no show charge for d.o.s. 06/14/16.  Patient doesn't remember making the appointment.  Please call patient about charge. Morey Hummingbird

## 2016-07-24 ENCOUNTER — Ambulatory Visit (INDEPENDENT_AMBULATORY_CARE_PROVIDER_SITE_OTHER): Payer: Medicare Other | Admitting: Family Medicine

## 2016-07-24 ENCOUNTER — Encounter: Payer: Self-pay | Admitting: Family Medicine

## 2016-07-24 VITALS — BP 122/64 | HR 97 | Temp 97.8°F | Wt 154.0 lb

## 2016-07-24 DIAGNOSIS — I10 Essential (primary) hypertension: Secondary | ICD-10-CM | POA: Diagnosis not present

## 2016-07-24 DIAGNOSIS — E876 Hypokalemia: Secondary | ICD-10-CM

## 2016-07-24 DIAGNOSIS — J9611 Chronic respiratory failure with hypoxia: Secondary | ICD-10-CM | POA: Diagnosis not present

## 2016-07-24 DIAGNOSIS — E039 Hypothyroidism, unspecified: Secondary | ICD-10-CM

## 2016-07-24 DIAGNOSIS — G8929 Other chronic pain: Secondary | ICD-10-CM | POA: Diagnosis not present

## 2016-07-24 DIAGNOSIS — G4700 Insomnia, unspecified: Secondary | ICD-10-CM

## 2016-07-24 LAB — CBC WITH DIFFERENTIAL/PLATELET
Basophils Absolute: 0 10*3/uL (ref 0.0–0.1)
Basophils Relative: 0.4 % (ref 0.0–3.0)
Eosinophils Absolute: 0.2 10*3/uL (ref 0.0–0.7)
Eosinophils Relative: 3.9 % (ref 0.0–5.0)
HCT: 32.3 % — ABNORMAL LOW (ref 36.0–46.0)
Hemoglobin: 10.7 g/dL — ABNORMAL LOW (ref 12.0–15.0)
Lymphocytes Relative: 28.3 % (ref 12.0–46.0)
Lymphs Abs: 1.5 10*3/uL (ref 0.7–4.0)
MCHC: 33.1 g/dL (ref 30.0–36.0)
MCV: 94.3 fl (ref 78.0–100.0)
Monocytes Absolute: 0.4 10*3/uL (ref 0.1–1.0)
Monocytes Relative: 7.4 % (ref 3.0–12.0)
Neutro Abs: 3.1 10*3/uL (ref 1.4–7.7)
Neutrophils Relative %: 60 % (ref 43.0–77.0)
Platelets: 210 10*3/uL (ref 150.0–400.0)
RBC: 3.43 Mil/uL — ABNORMAL LOW (ref 3.87–5.11)
RDW: 15.6 % — ABNORMAL HIGH (ref 11.5–15.5)
WBC: 5.2 10*3/uL (ref 4.0–10.5)

## 2016-07-24 LAB — COMPREHENSIVE METABOLIC PANEL
ALT: 7 U/L (ref 0–35)
AST: 26 U/L (ref 0–37)
Albumin: 3.9 g/dL (ref 3.5–5.2)
Alkaline Phosphatase: 47 U/L (ref 39–117)
BUN: 10 mg/dL (ref 6–23)
CO2: 35 mEq/L — ABNORMAL HIGH (ref 19–32)
Calcium: 9.2 mg/dL (ref 8.4–10.5)
Chloride: 101 mEq/L (ref 96–112)
Creatinine, Ser: 0.77 mg/dL (ref 0.40–1.20)
GFR: 76.01 mL/min (ref >60.00)
Glucose, Bld: 93 mg/dL (ref 70–99)
Potassium: 4 mEq/L (ref 3.5–5.1)
Sodium: 140 mEq/L (ref 135–145)
Total Bilirubin: 0.6 mg/dL (ref 0.2–1.2)
Total Protein: 7.3 g/dL (ref 6.0–8.3)

## 2016-07-24 LAB — TSH: TSH: 4.83 u[IU]/mL — ABNORMAL HIGH (ref 0.35–4.50)

## 2016-07-24 MED ORDER — RANITIDINE HCL 150 MG PO TABS: 150.0000 mg | ORAL_TABLET | Freq: Two times a day (BID) | ORAL | 3 refills | Status: DC

## 2016-07-24 MED ORDER — ALBUTEROL SULFATE HFA 108 (90 BASE) MCG/ACT IN AERS: 1.0000 | INHALATION_SPRAY | Freq: Four times a day (QID) | RESPIRATORY_TRACT | 12 refills | Status: DC | PRN

## 2016-07-24 MED ORDER — OXYCODONE HCL 10 MG PO TABS: 10.0000 mg | ORAL_TABLET | Freq: Four times a day (QID) | ORAL | 0 refills | Status: DC | PRN

## 2016-07-24 MED ORDER — TIOTROPIUM BROMIDE MONOHYDRATE 18 MCG IN CAPS: ORAL_CAPSULE | RESPIRATORY_TRACT | 12 refills | Status: DC

## 2016-07-24 MED ORDER — DICLOFENAC SODIUM 1 % TD GEL: TRANSDERMAL | 3 refills | Status: DC

## 2016-07-24 MED ORDER — FUROSEMIDE 20 MG PO TABS: ORAL_TABLET | ORAL | 0 refills | Status: DC

## 2016-07-24 MED ORDER — TRAZODONE HCL 50 MG PO TABS: ORAL_TABLET | ORAL | 3 refills | Status: DC

## 2016-07-24 MED ORDER — MONTELUKAST SODIUM 10 MG PO TABS: 10.0000 mg | ORAL_TABLET | Freq: Every day | ORAL | 3 refills | Status: DC

## 2016-07-24 MED ORDER — FLUTICASONE FUROATE-VILANTEROL 200-25 MCG/INH IN AEPB: 1.0000 | INHALATION_SPRAY | Freq: Every day | RESPIRATORY_TRACT | 12 refills | Status: DC

## 2016-07-24 MED ORDER — ALPRAZOLAM 1 MG PO TABS: ORAL_TABLET | ORAL | 2 refills | Status: DC

## 2016-07-24 MED ORDER — GABAPENTIN 300 MG PO CAPS: 600.0000 mg | ORAL_CAPSULE | Freq: Two times a day (BID) | ORAL | 3 refills | Status: DC

## 2016-07-24 MED ORDER — POTASSIUM CHLORIDE ER 10 MEQ PO TBCR: 10.0000 meq | EXTENDED_RELEASE_TABLET | Freq: Every day | ORAL | 0 refills | Status: DC

## 2016-07-24 MED ORDER — LEVOTHYROXINE SODIUM 112 MCG PO TABS: ORAL_TABLET | ORAL | 3 refills | Status: DC

## 2016-07-24 MED ORDER — AMLODIPINE BESYLATE 2.5 MG PO TABS: 2.5000 mg | ORAL_TABLET | Freq: Every day | ORAL | 3 refills | Status: DC

## 2016-07-24 MED ORDER — FLUTICASONE PROPIONATE 50 MCG/ACT NA SUSP: NASAL | 12 refills | Status: DC

## 2016-07-24 MED ORDER — DULOXETINE HCL 60 MG PO CPEP: 60.0000 mg | ORAL_CAPSULE | Freq: Every day | ORAL | 3 refills | Status: DC

## 2016-07-24 NOTE — Progress Notes (Signed)
Pre visit review using our clinic review tool, if applicable. No additional management support is needed unless otherwise documented below in the visit note. 

## 2016-07-24 NOTE — Progress Notes (Signed)
Chronic back pain at and below the belt line. Fall cautions d/w pt.  Still with anxiety related to her breathing, still on xanax prn.  Rare use.  Chronic back pain s/p pain clinic eval, injection.  Still on gabapentin.  She is trying to put up with whatever pain she has going on and is trying to avoid further invasive w/u or tx.  I support that, d/w pt.  She has pain with walking.  She walks with cane.  D/w pt about PT.  She wanted to try it at home, doing the exercises on her own.  D/w pt about fall/alert button.  Flu shot prev done.   COPD/CRF.  O2 dependence.  Still on inhalers but hasn't had to use Breo as much recently.  She feels "good" compared to her prev baseline.    HTN.  No recent use of lasix. No ADE on med. See notes on labs.    Hypothyroidism.  Still on med, compliant.  No ADE on med.    Trazodone at night for insomnia.  No ADE on med.  Helps her sleep.  She backed down to 100mg  a day.  D/w pt about lowering her dose if needed/tolerated.   PMH and SH reviewed  ROS: Per HPI unless specifically indicated in ROS section   Meds, vitals, and allergies reviewed.   GEN: nad, alert and oriented, chronically ill appearing on O2 at baseline.  HEENT: mucous membranes moist NECK: supple w/o LA CV: rrr. PULM: ctab, no inc wob ABD: soft, +bs EXT: no edema SKIN: no acute rash Back ttp in lower back, bilaterally, no rash.  No bruising.   Able to bear weight.  S/S grossly wnl BLE

## 2016-07-24 NOTE — Patient Instructions (Signed)
Go to the lab on the way out.  We'll contact you with your lab report. Don't change your meds for now.   Please call in.  Thanks.  Update me as needed.

## 2016-07-26 DIAGNOSIS — G8929 Other chronic pain: Secondary | ICD-10-CM | POA: Insufficient documentation

## 2016-07-26 HISTORY — DX: Other chronic pain: G89.29

## 2016-07-26 NOTE — Assessment & Plan Note (Signed)
No tmg on exam, continue as is.  See notes on labs.

## 2016-07-26 NOTE — Assessment & Plan Note (Signed)
See notes on labs. Can try taking only with lasix.

## 2016-07-26 NOTE — Assessment & Plan Note (Addendum)
Chronic back pain at and below the belt line. Fall cautions d/w pt.  Still with anxiety related to her breathing, still on xanax prn.  Rare use.  Chronic back pain s/p pain clinic eval, injection.  Still on gabapentin.  She is trying to put up with whatever pain she has going on and is trying to avoid further invasive w/u or tx.  I support that, d/w pt.  She has pain with walking.  She walks with cane.  D/w pt about PT.  She wanted to try it at home, doing the exercises on her own.  D/w pt about fall/alert button. No sedation, continue as is.  D/w pt.  She agrees.  This appears to be the best option for her pain at this point.

## 2016-07-26 NOTE — Assessment & Plan Note (Addendum)
See above. she had trials of antihistamines and TCAs prev w/o effect.  Continue as is.  >25 minutes spent in face to face time with patient, >50% spent in counselling or coordination of care.

## 2016-07-26 NOTE — Assessment & Plan Note (Signed)
Controlled, See notes on labs. No change in meds except for taking K only with lasix.

## 2016-07-26 NOTE — Assessment & Plan Note (Signed)
Continue as is.  Continue O2, she appears as well as I've seen her, in my recollection.  No focal dec in BS, no wheeze.

## 2016-07-30 ENCOUNTER — Encounter: Payer: Self-pay | Admitting: *Deleted

## 2016-07-31 ENCOUNTER — Telehealth: Payer: Self-pay

## 2016-07-31 NOTE — Telephone Encounter (Signed)
PA started on CMM. Says it has a favorable outcome. Should fax [place] the approval or denial in the next few days.

## 2016-08-01 ENCOUNTER — Other Ambulatory Visit: Payer: Self-pay

## 2016-08-02 NOTE — Telephone Encounter (Signed)
Received fax, pharmacy notified, Dr. Damita Dunnings signed letter and letter was sent for scanning

## 2016-08-07 ENCOUNTER — Telehealth: Payer: Self-pay

## 2016-08-07 NOTE — Telephone Encounter (Signed)
Pt left v/m at 4:25;pt said last night she had afib and said Dr Rex Kras cardiologist treated pt for this 20 years ago. Pt took toprol that Dr Rex Kras had given her yrs ago. Pt wants Dr Damita Dunnings to call med to [facility]. Pt request cb. I tried to call pt for 55 mins without success; phone remains busy. I spoke with [name] pts daughter and she said she knew that her dad had afib but was not aware her mom had afib.I could not find afib on pt problem list.Tina tried to reach pt also with no success. Cleora Fleet to continue to reach pt and then have pt call office [phone] and pt can talk with team health nurse. [name] voiced understanding. FYI to Dr Damita Dunnings.

## 2016-08-08 NOTE — Telephone Encounter (Signed)
Left voice mail on husband's cell phone to return call.

## 2016-08-08 NOTE — Telephone Encounter (Signed)
Please see what information you can get. I need more information. If patient truly has new onset A. fib then she needs to be checked. Thanks.

## 2016-08-09 NOTE — Telephone Encounter (Signed)
I spoke with pt and she wants Toprol XL 25 mg taking one tab daily prn called in to pharmacy. Today pts heart rate is not too fast and is regular. Pt thinks toprol given to pt in 2006. I f Dr Damita Dunnings will not fill Toprol pt will get it from so someone else; pt does not want an appt.Please advise.[facility]

## 2016-08-09 NOTE — Telephone Encounter (Signed)
Noted. Thanks.

## 2016-08-09 NOTE — Telephone Encounter (Signed)
Patient returned call

## 2016-08-09 NOTE — Telephone Encounter (Signed)
Pt notified as instructed; pt voiced understanding but pt said she lives in [place] and is not having a problem now and is not going to make appt. Pt will cb if needed.FYI to Dr Damita Dunnings.

## 2016-08-09 NOTE — Telephone Encounter (Signed)
If there is a concern for A fib or heart rate concerns, then she needs OV with MD.  I didn't change her meds.

## 2016-08-09 NOTE — Telephone Encounter (Signed)
Tried to call patient and the phone would just ring and then would stop. Will have to try to call back again later.

## 2016-08-18 ENCOUNTER — Other Ambulatory Visit: Payer: Self-pay | Admitting: Family Medicine

## 2016-09-10 ENCOUNTER — Other Ambulatory Visit: Payer: Self-pay | Admitting: *Deleted

## 2016-09-10 MED ORDER — OXYCODONE HCL 10 MG PO TABS: 10.0000 mg | ORAL_TABLET | Freq: Four times a day (QID) | ORAL | 0 refills | Status: DC | PRN

## 2016-09-10 NOTE — Telephone Encounter (Signed)
Last filled 07/24/16. Last f/u 07/2016

## 2016-09-10 NOTE — Telephone Encounter (Signed)
I initially thought that this was too early to fill.  I realized it's okay to fill now. Printed. Thanks.

## 2016-09-11 NOTE — Telephone Encounter (Signed)
Patient notified by telephone that script is up front ready for pickup. 

## 2016-09-17 ENCOUNTER — Other Ambulatory Visit: Payer: Self-pay | Admitting: Family Medicine

## 2016-09-17 DIAGNOSIS — M810 Age-related osteoporosis without current pathological fracture: Secondary | ICD-10-CM

## 2016-09-17 DIAGNOSIS — E785 Hyperlipidemia, unspecified: Secondary | ICD-10-CM

## 2016-09-20 ENCOUNTER — Ambulatory Visit (INDEPENDENT_AMBULATORY_CARE_PROVIDER_SITE_OTHER): Payer: Medicare Other

## 2016-09-20 ENCOUNTER — Ambulatory Visit: Payer: Medicare Other

## 2016-09-20 ENCOUNTER — Other Ambulatory Visit: Payer: Self-pay | Admitting: Family Medicine

## 2016-09-20 VITALS — BP 118/70 | HR 75 | Temp 98.0°F | Ht 63.5 in | Wt 154.5 lb

## 2016-09-20 DIAGNOSIS — R300 Dysuria: Secondary | ICD-10-CM | POA: Diagnosis not present

## 2016-09-20 DIAGNOSIS — Z0000 Encounter for general adult medical examination without abnormal findings: Secondary | ICD-10-CM | POA: Diagnosis not present

## 2016-09-20 DIAGNOSIS — E785 Hyperlipidemia, unspecified: Secondary | ICD-10-CM

## 2016-09-20 DIAGNOSIS — M810 Age-related osteoporosis without current pathological fracture: Secondary | ICD-10-CM | POA: Diagnosis not present

## 2016-09-20 LAB — POC URINALSYSI DIPSTICK (AUTOMATED)
Bilirubin, UA: NEGATIVE
Glucose, UA: NEGATIVE
Ketones, UA: NEGATIVE
Nitrite, UA: NEGATIVE
Spec Grav, UA: 1.03 (ref 1.030–1.035)
Urobilinogen, UA: NEGATIVE (ref ?–2.0)
pH, UA: 6 (ref 5.0–8.0)

## 2016-09-20 LAB — LIPID PANEL
Cholesterol: 189 mg/dL (ref 0–200)
HDL: 46.5 mg/dL (ref >39.00)
LDL Cholesterol: 123 mg/dL — ABNORMAL HIGH (ref 0–99)
NonHDL: 142.06
Total CHOL/HDL Ratio: 4
Triglycerides: 95 mg/dL (ref 0.0–149.0)
VLDL: 19 mg/dL (ref 0.0–40.0)

## 2016-09-20 LAB — VITAMIN D 25 HYDROXY (VIT D DEFICIENCY, FRACTURES): VITD: 27.62 ng/mL — ABNORMAL LOW (ref 30.00–100.00)

## 2016-09-20 MED ORDER — CEPHALEXIN 500 MG PO CAPS: 500.0000 mg | ORAL_CAPSULE | Freq: Two times a day (BID) | ORAL | 0 refills | Status: DC

## 2016-09-20 NOTE — Progress Notes (Signed)
Subjective:   [name] is a 83 y.o. female who presents for Medicare Annual (Subsequent) preventive examination.  Review of Systems:  N/A Cardiac Risk Factors include: advanced age (>85men, >45 women)     Objective:     Vitals: BP 118/70 (BP Location: Left Arm, Patient Position: Sitting, Cuff Size: Normal)   Pulse 75   Temp 98 F (36.7 C) (Oral)   Ht 5' 3.5" (1.613 m) Comment: shoes  Wt 154 lb 8 oz (70.1 kg)   SpO2 94%   BMI 26.94 kg/m   Body mass index is 26.94 kg/m.   Tobacco History  Smoking Status  . Former Smoker  . Packs/day: 1.50  . Years: 10.00  . Types: Cigarettes  . Quit date: 07/09/1985  Smokeless Tobacco  . Never Used    Comment: 1 1/2 ppd x 20 years     Counseling given: No   Past Medical History:  Diagnosis Date  . Allergic rhinitis, cause unspecified   . Allergy, unspecified not elsewhere classified   . Anxiety   . Asthma   . Chronic kidney disease    frequency  . COPD (chronic obstructive pulmonary disease) ([place])   . Degeneration of intervertebral disc, site unspecified   . Diverticulosis of colon (without mention of hemorrhage)   . Esophageal reflux   . Headache   . Insomnia, unspecified   . Myalgia and myositis, unspecified   . Need for prophylactic hormone replacement therapy (postmenopausal)   . Osteoarthritis of right knee 08/27/2011   R knee pain, s/p arthroscopy 2012 per [name]   . Osteoporosis, unspecified   . Other abnormal blood chemistry   . Other and unspecified hyperlipidemia   . Other chronic pain   . Stroke [facility])    mini strokes  . Unspecified essential hypertension   . Unspecified hypothyroidism    Past Surgical History:  Procedure Laterality Date  . BACK SURGERY    . breast cystectomy    . JOINT REPLACEMENT     shoulder right  . KNEE ARTHROSCOPY     Right knee 2012  . MASS EXCISION Left 04/07/2015   Procedure: MINOR EXCISION OF MASS LEFT SMALL FINGER;  Surgeon: Leanora Cover, MD;  Location: [place];  Service: Orthopedics;  Laterality: Left;  . SHOULDER SURGERY    . TOTAL KNEE ARTHROPLASTY  12/11/2011   Procedure: TOTAL KNEE ARTHROPLASTY;  Surgeon: Johnny Bridge, MD;  Location: [place];  Service: Orthopedics;  Laterality: Right;  . UMBILICAL HERNIA REPAIR N/A 06/09/2013   Procedure: EXPLORATIORY LAPAROTOMY, HERNIA REPAIR UMBILICAL, INSERTION OF MESH;  Surgeon: Haywood Lasso, MD;  Location: [facility] OR;  Service: General;  Laterality: N/A;   Family History  Problem Relation Age of Onset  . Lung cancer Brother     x2  . Hypertension Father   . Heart disease Mother   . Asthma Sister    History  Sexual Activity  . Sexual activity: No    Outpatient Encounter Prescriptions as of 09/20/2016  Medication Sig  . albuterol (PROVENTIL HFA;VENTOLIN HFA) 108 (90 Base) MCG/ACT inhaler Inhale 1-2 puffs into the lungs every 6 (six) hours as needed for wheezing or shortness of breath.  . ALPRAZolam (XANAX) 1 MG tablet TAKE 1/2 TABLET BY MOUTH 2 TIMES A DAY AS NEEDED.  Marland Kitchen amLODipine (NORVASC) 2.5 MG tablet Take 1 tablet (2.5 mg total) by mouth daily.  . Calcium Carbonate (CALTRATE 600 PO) Take 1 tablet by mouth daily.    Marland Kitchen  Cholecalciferol (VITAMIN D3) 1000 UNITS CAPS Take 1 capsule by mouth daily.    . diclofenac sodium (VOLTAREN) 1 % GEL APPLY 4 GRAMS TO AFFECTED AREA 2 TIMES A DAY.  . DULoxetine (CYMBALTA) 60 MG capsule Take 1 capsule (60 mg total) by mouth daily.  . fluticasone (FLONASE) 50 MCG/ACT nasal spray PLACE 1 TO 2 SPRAYS IN BOTH NOSTRILS DAILY.  . fluticasone furoate-vilanterol (BREO ELLIPTA) 200-25 MCG/INH AEPB Inhale 1 puff into the lungs daily.  . furosemide (LASIX) 20 MG tablet TAKE 1/2 TO 1 TABLET BY MOUTH EVERY MORNING AS NEEDED FOR SWELLING. USE SPARINGLY.  Marland Kitchen gabapentin (NEURONTIN) 300 MG capsule Take 2 capsules (600 mg total) by mouth 2 (two) times daily.  Marland Kitchen ibuprofen (ADVIL,MOTRIN) 200 MG tablet Take 400 mg by mouth every 6 (six) hours as needed for mild pain or  moderate pain.  Marland Kitchen levothyroxine (SYNTHROID, LEVOTHROID) 112 MCG tablet TAKE 1 TABLET BY MOUTH DAILY BEFORE BREAKFAST.  . montelukast (SINGULAIR) 10 MG tablet Take 1 tablet (10 mg total) by mouth at bedtime.  . Multiple Vitamin (MULTIVITAMIN) capsule Take 1 capsule by mouth daily.    . NON FORMULARY Oxygen Use 3 liters 24/7  . Oxycodone HCl 10 MG TABS Take 1 tablet (10 mg total) by mouth 4 (four) times daily as needed (pain.sedation caution).  . potassium chloride (K-DUR) 10 MEQ tablet Take 1 tablet (10 mEq total) by mouth daily. If taking lasix/furosemide  . ranitidine (ZANTAC) 150 MG tablet Take 1 tablet (150 mg total) by mouth 2 (two) times daily.  Marland Kitchen tiotropium (SPIRIVA HANDIHALER) 18 MCG inhalation capsule PLACE 1 CAPSULE INTO INHALER AND INHALE ONCE A DAY  . traZODone (DESYREL) 50 MG tablet TAKE 1.5-2 TABLETS (75-100MG  TOTAL) BY MOUTH AT BEDTIME AS NEEDED FOR SLEEP.   No facility-administered encounter medications on file as of 09/20/2016.     Activities of Daily Living In your present state of health, do you have any difficulty performing the following activities: 09/20/2016  Hearing? Y  Vision? Y  Difficulty concentrating or making decisions? N  Walking or climbing stairs? Y  Dressing or bathing? N  Doing errands, shopping? Y  Preparing Food and eating ? Y  Using the Toilet? N  In the past six months, have you accidently leaked urine? Y  Do you have problems with loss of bowel control? N  Managing your Medications? N  Managing your Finances? N  Housekeeping or managing your Housekeeping? Y  Some recent data might be hidden    Patient Care Team: Tonia Ghent, MD as PCP - General Elsie Stain, MD (Pulmonary Disease)    Assessment:    Hearing Screening Comments: Bilateral hearing aids Vision Screening Comments: Last vision exam Jan 2018 with Dr. Donato Heinz  Exercise Activities and Dietary recommendations Current Exercise Habits: The patient does not participate  in regular exercise at present, Exercise limited by: respiratory conditions(s);cardiac condition(s)  Goals    . safety          Starting 09/20/2016, I will continue to use assistive devices as needed to reduce the risk of falls.       Fall Risk Fall Risk  09/20/2016  Falls in the past year? No   Depression Screen PHQ 2/9 Scores 09/20/2016  PHQ - 2 Score 1     Cognitive Function MMSE - Mini Mental State Exam 09/20/2016 02/15/2015  Orientation to time 5 5  Orientation to Place 5 4  Registration 3 3  Attention/ Calculation 0 4  Recall 3 2  Language- name 2 objects 0 2  Language- repeat 1 1  Language- follow 3 step command 3 3  Language- read & follow direction 0 1  Write a sentence 0 1  Copy design 0 1  Total score 20 27     PLEASE NOTE: A Mini-Cog screen was completed. Maximum score is 20. A value of 0 denotes this part of [place] MMSE was not completed or the patient failed this part of the Mini-Cog screening.   Mini-Cog Screening Orientation to Time - Max 5 pts Orientation to Place - Max 5 pts Registration - Max 3 pts Recall - Max 3 pts Language Repeat - Max 1 pts Language Follow 3 Step Command - Max 3 pts     Immunization History  Administered Date(s) Administered  . Influenza Split 04/09/2011, 03/19/2012, 04/21/2013  . Influenza Whole 04/08/2004, 05/07/2007, 03/11/2009, 04/21/2010  . Influenza-Unspecified 04/20/2013, 04/07/2014, 05/02/2015, 04/08/2016  . Pneumococcal Conjugate-13 08/23/2015  . Pneumococcal Polysaccharide-23 07/09/1998  . Td 04/19/2008  . Zoster 05/02/2015   Screening Tests Health Maintenance  Topic Date Due  . DEXA SCAN  09/20/2017 (Originally 02/08/1998)  . TETANUS/TDAP  04/19/2018  . INFLUENZA VACCINE  Completed  . PNA vac Low Risk Adult  Completed      Plan:     I have personally reviewed and addressed the Medicare Annual Wellness questionnaire and have noted the following in the patient's chart:  A. Medical and social history B. Use  of alcohol, tobacco or illicit drugs  C. Current medications and supplements D. Functional ability and status E.  Nutritional status F.  Physical activity G. Advance directives H. List of other physicians I.  Hospitalizations, surgeries, and ER visits in previous 12 months J.  [place] to include hearing, vision, cognitive, depression L. Referrals and appointments - none  In addition, I have reviewed and discussed with patient certain preventive protocols, quality metrics, and best practice recommendations. A written personalized care plan for preventive services as well as general preventive health recommendations were provided to patient.  See attached scanned questionnaire for additional information.   Signed,   Lindell Noe, MHA, BS, LPN Health Coach

## 2016-09-20 NOTE — Progress Notes (Signed)
Pre visit review using our clinic review tool, if applicable. No additional management support is needed unless otherwise documented below in the visit note. 

## 2016-09-20 NOTE — Patient Instructions (Signed)
[name] , Thank you for taking time to come for your Medicare Wellness Visit. I appreciate your ongoing commitment to your health goals. Please review the following plan we discussed and let me know if I can assist you in the future.   These are the goals we discussed: Goals    . safety          Starting 09/20/2016, I will continue to use assistive devices as needed to reduce the risk of falls.        This is a list of the screening recommended for you and due dates:  Health Maintenance  Topic Date Due  . DEXA scan (bone density measurement)  09/20/2017*  . Tetanus Vaccine  04/19/2018  . Flu Shot  Completed  . Pneumonia vaccines  Completed  *Topic was postponed. The date shown is not the original due date.   Preventive Care for Adults  A healthy lifestyle and preventive care can promote health and wellness. Preventive health guidelines for adults include the following key practices.  . A routine yearly physical is a good way to check with your health care provider about your health and preventive screening. It is a chance to share any concerns and updates on your health and to receive a thorough exam.  . Visit your dentist for a routine exam and preventive care every 6 months. Brush your teeth twice a day and floss once a day. Good oral hygiene prevents tooth decay and gum disease.  . The frequency of eye exams is based on your age, health, family medical history, use  of contact lenses, and other factors. Follow your health care provider's ecommendations for frequency of eye exams.  . Eat a healthy diet. Foods like vegetables, fruits, whole grains, low-fat dairy products, and lean protein foods contain the nutrients you need without too many calories. Decrease your intake of foods high in solid fats, added sugars, and salt. Eat the right amount of calories for you. Get information about a proper diet from your health care provider, if necessary.  . Regular physical exercise is  one of the most important things you can do for your health. Most adults should get at least 150 minutes of moderate-intensity exercise (any activity that increases your heart rate and causes you to sweat) each week. In addition, most adults need muscle-strengthening exercises on 2 or more days a week.  Silver Sneakers may be a benefit available to you. To determine eligibility, you may visit the website: www.silversneakers.com or contact program at 919-085-8117 Mon-Fri between 8AM-8PM.   . Maintain a healthy weight. The body mass index (BMI) is a screening tool to identify possible weight problems. It provides an estimate of body fat based on height and weight. Your health care provider can find your BMI and can help you achieve or maintain a healthy weight.   For adults 20 years and older: ? A BMI below 18.5 is considered underweight. ? A BMI of 18.5 to 24.9 is normal. ? A BMI of 25 to 29.9 is considered overweight. ? A BMI of 30 and above is considered obese.   . Maintain normal blood lipids and cholesterol levels by exercising and minimizing your intake of saturated fat. Eat a balanced diet with plenty of fruit and vegetables. Blood tests for lipids and cholesterol should begin at age 80 and be repeated every 5 years. If your lipid or cholesterol levels are high, you are over 50, or you are at high risk for heart disease,  you may need your cholesterol levels checked more frequently. Ongoing high lipid and cholesterol levels should be treated with medicines if diet and exercise are not working.  . If you smoke, find out from your health care provider how to quit. If you do not use tobacco, please do not start.  . If you choose to drink alcohol, please do not consume more than 2 drinks per day. One drink is considered to be 12 ounces (355 mL) of beer, 5 ounces (148 mL) of wine, or 1.5 ounces (44 mL) of liquor.  . If you are 56-94 years old, ask your health care provider if you should take  aspirin to prevent strokes.  . Use sunscreen. Apply sunscreen liberally and repeatedly throughout the day. You should seek shade when your shadow is shorter than you. Protect yourself by wearing long sleeves, pants, a wide-brimmed hat, and sunglasses year round, whenever you are outdoors.  . Once a month, do a whole body skin exam, using a mirror to look at the skin on your back. Tell your health care provider of new moles, moles that have irregular borders, moles that are larger than a pencil eraser, or moles that have changed in shape or color.

## 2016-09-20 NOTE — Progress Notes (Addendum)
PCP notes:   Health maintenance:  Bone density - pt will discuss necessity for screening at CPE  Abnormal screenings:   Depression score: 1  Patient concerns:   Patient has complaint of burning sensation when urinating. PCP notified. UA ordered. Specimen positive for leukocytes and hematuria. Culture ordered.  Nurse concerns:  None  Next PCP appt:   09/25/16 @ 1215  I reviewed health advisor's note, was available for consultation on the day of service listed in this note, and agree with documentation and plan. Elsie Stain, MD.

## 2016-09-21 LAB — URINE CULTURE

## 2016-09-25 ENCOUNTER — Ambulatory Visit (INDEPENDENT_AMBULATORY_CARE_PROVIDER_SITE_OTHER): Payer: Medicare Other | Admitting: Family Medicine

## 2016-09-25 ENCOUNTER — Encounter: Payer: Self-pay | Admitting: Family Medicine

## 2016-09-25 VITALS — BP 104/60 | HR 80 | Temp 97.8°F | Wt 152.5 lb

## 2016-09-25 DIAGNOSIS — G894 Chronic pain syndrome: Secondary | ICD-10-CM | POA: Diagnosis not present

## 2016-09-25 DIAGNOSIS — M797 Fibromyalgia: Secondary | ICD-10-CM

## 2016-09-25 DIAGNOSIS — E559 Vitamin D deficiency, unspecified: Secondary | ICD-10-CM

## 2016-09-25 DIAGNOSIS — R300 Dysuria: Secondary | ICD-10-CM

## 2016-09-25 DIAGNOSIS — H9190 Unspecified hearing loss, unspecified ear: Secondary | ICD-10-CM

## 2016-09-25 DIAGNOSIS — Z7189 Other specified counseling: Secondary | ICD-10-CM | POA: Diagnosis not present

## 2016-09-25 DIAGNOSIS — E039 Hypothyroidism, unspecified: Secondary | ICD-10-CM | POA: Diagnosis not present

## 2016-09-25 DIAGNOSIS — Z0000 Encounter for general adult medical examination without abnormal findings: Secondary | ICD-10-CM

## 2016-09-25 DIAGNOSIS — J4489 Other specified chronic obstructive pulmonary disease: Secondary | ICD-10-CM

## 2016-09-25 DIAGNOSIS — J449 Chronic obstructive pulmonary disease, unspecified: Secondary | ICD-10-CM | POA: Diagnosis not present

## 2016-09-25 MED ORDER — VITAMIN D3 25 MCG (1000 UNIT) PO TABS: 1000.0000 [IU] | ORAL_TABLET | Freq: Every day | ORAL | Status: DC

## 2016-09-25 NOTE — Progress Notes (Signed)
Pre visit review using our clinic review tool, if applicable. No additional management support is needed unless otherwise documented below in the visit note. 

## 2016-09-25 NOTE — Progress Notes (Signed)
Patient had complaint of burning sensation when urinating. Prev UA ordered. Specimen positive for leukocytes and hematuria. Culture ordered. Results discussed with patient. Presumed UTI. She is some better on keflex in the meantime.  She'll update me as needed.  We talked about her difficulty getting into the clinic. I sent her home with a sterile container indication needed to collect another urine sample in the future. She will update [place] as needed.  Chronic pain.  "I hurt bad" all the time but her headaches are better.  She has multiple sources for pain. She had prev eval at the pain clinic and had injections done. She has degenerative joint disease in addition to fibromyalgia. She has been treated with current medications without adverse effect. We talked about her med use overall. She is not using opiates inappropriately. She is not asking for inappropriate refills. At this point she is functional with her current level of medication. She believes, and I agree, that if she were on significantly less medicine her functional status would likely be much worse due to increase in pain. She is not sedated and overtreated at this point. Therefore it is likely reasonable to continue as is, with the risk and benefit of her current medications discussed with patient thoroughly with family present. All are in agreement.  Pulmonary disease.  D/w pt about once yearly pulmonary f/u.  No needing SABA every day.  Still on O2.  Compliant with medications.  Hypothyroidism. TSH nearly normal.  Likely that changing treatment at this point would not be beneficial. Discussed with patient. She agrees. We can follow episodically.  Defer DXA at this point.  She isn't interested in tx at this point so imaging deferred for now .   D/w pt about vit D, which was slightly low.  She can start replacement. See medicine list.  Mammogram d/w pt.  She wouldn't want tx.  She deferred and this was reasonable. Discussed with  patient.  Advance directive d/w pt.  His patient were incapacitated, her husband would be designated.  If husband were unable to make decisions, then both daughters would be equally designated.    HOH.  Has hearing aids.  Hearing is still poor.  D/w pt.   See AVS.   PMH and SH reviewed  ROS: Per HPI unless specifically indicated in ROS section   Meds, vitals, and allergies reviewed.   GEN: nad, alert and oriented, on O2 via [place] HEENT: mucous membranes moist NECK: supple w/o LA CV: rrr.  no murmur PULM: Global decrease in breath sounds but otherwise ctab, no inc wob ABD: soft, +bs EXT: no edema SKIN: no acute rash

## 2016-09-25 NOTE — Patient Instructions (Addendum)
Update me if you continue to have urinary troubles.  Restart vitamin D.   Ask if the hearing clinic can reprogram the hearing aids or make them more comfortable.  Call about pulmonary follow up.  Don't change your other meds for now.  Take care.  Glad to see you.

## 2016-09-26 DIAGNOSIS — Z7189 Other specified counseling: Secondary | ICD-10-CM | POA: Insufficient documentation

## 2016-09-26 DIAGNOSIS — H9190 Unspecified hearing loss, unspecified ear: Secondary | ICD-10-CM | POA: Insufficient documentation

## 2016-09-26 DIAGNOSIS — R300 Dysuria: Secondary | ICD-10-CM | POA: Insufficient documentation

## 2016-09-26 DIAGNOSIS — E559 Vitamin D deficiency, unspecified: Secondary | ICD-10-CM | POA: Insufficient documentation

## 2016-09-26 DIAGNOSIS — Z0000 Encounter for general adult medical examination without abnormal findings: Secondary | ICD-10-CM | POA: Insufficient documentation

## 2016-09-26 HISTORY — DX: Vitamin D deficiency, unspecified: E55.9

## 2016-09-26 NOTE — Assessment & Plan Note (Signed)
Advance directive d/w pt.  His patient were incapacitated, her husband would be designated.  If husband were unable to make decisions, then both daughters would be equally designated.

## 2016-09-26 NOTE — Assessment & Plan Note (Signed)
See after visit summary. Discussed with patient.

## 2016-09-26 NOTE — Assessment & Plan Note (Signed)
See above. Risk and benefit of current medications discussed with patient. Her functional status is improved with current medications. She is not sedated. Reasonable to continue as is for now. She agrees.

## 2016-09-26 NOTE — Assessment & Plan Note (Signed)
Mammogram and bone density declined. Discussed with patient. This is reasonable.

## 2016-09-26 NOTE — Assessment & Plan Note (Signed)
See above. Continue current medications.

## 2016-09-26 NOTE — Assessment & Plan Note (Signed)
Continue as is. We can follow episodically. She agrees. >40 minutes spent in face to face time with patient, >50% spent in counselling or coordination of care.

## 2016-09-26 NOTE — Assessment & Plan Note (Signed)
Continue current medications and oxygen. Encouraged patient for routine pulmonary follow-up. She agrees.

## 2016-09-26 NOTE — Assessment & Plan Note (Signed)
Mild. Start replacement. She agrees. We can recheck periodically.

## 2016-09-26 NOTE — Assessment & Plan Note (Signed)
Finish antibiotics. Update me as needed. We sent her home with a sterile container in case she needed to collect another urine sample.

## 2016-10-22 ENCOUNTER — Other Ambulatory Visit: Payer: Self-pay

## 2016-10-22 NOTE — Telephone Encounter (Signed)
Pt left v;m requesting rx oxycodone. Call when ready for pick up. Last printed # 120 on 09/10/16. Pt last seen 09/25/16.

## 2016-10-23 MED ORDER — OXYCODONE HCL 10 MG PO TABS: 10.0000 mg | ORAL_TABLET | Freq: Four times a day (QID) | ORAL | 0 refills | Status: DC | PRN

## 2016-10-23 NOTE — Telephone Encounter (Signed)
Printed.  Thanks.  

## 2016-10-23 NOTE — Telephone Encounter (Signed)
Patient advised.  Rx left at front desk for pick up. 

## 2016-10-24 ENCOUNTER — Other Ambulatory Visit (INDEPENDENT_AMBULATORY_CARE_PROVIDER_SITE_OTHER): Payer: Medicare Other

## 2016-10-24 ENCOUNTER — Telehealth: Payer: Self-pay

## 2016-10-24 DIAGNOSIS — R300 Dysuria: Secondary | ICD-10-CM

## 2016-10-24 LAB — URINALYSIS, ROUTINE W REFLEX MICROSCOPIC
Bilirubin Urine: NEGATIVE
Ketones, ur: NEGATIVE
Nitrite: NEGATIVE
Specific Gravity, Urine: 1.02 (ref 1.000–1.030)
Total Protein, Urine: NEGATIVE
Urine Glucose: NEGATIVE
Urobilinogen, UA: 0.2 (ref 0.0–1.0)
pH: 6 (ref 5.0–8.0)

## 2016-10-24 NOTE — Telephone Encounter (Signed)
Noted! Thank you

## 2016-10-24 NOTE — Telephone Encounter (Signed)
Ordered.  Thanks.   Will await results.

## 2016-10-24 NOTE — Telephone Encounter (Signed)
Pt came by the lab and said she was instructed she could leave a urine specimen. I do not see any future orders in Epic. Pt says she is still having some dysuria and urinary frequency.  Please advise. Thank you.

## 2016-10-25 ENCOUNTER — Other Ambulatory Visit: Payer: Self-pay | Admitting: Family Medicine

## 2016-10-25 MED ORDER — CEPHALEXIN 500 MG PO CAPS: 500.0000 mg | ORAL_CAPSULE | Freq: Two times a day (BID) | ORAL | 0 refills | Status: DC

## 2016-10-26 LAB — URINE CULTURE

## 2016-11-14 ENCOUNTER — Other Ambulatory Visit: Payer: Self-pay | Admitting: Family Medicine

## 2016-11-14 NOTE — Telephone Encounter (Signed)
Received refill electronically Last refill 07/24/16 #180/3 Last office visit 09/25/16

## 2016-11-15 NOTE — Telephone Encounter (Signed)
Patient advised.

## 2016-11-15 NOTE — Telephone Encounter (Signed)
Sent.  Okay to take up to 150mg  at night, okay to take less if needed/tolerated.  Thanks.

## 2016-11-21 ENCOUNTER — Other Ambulatory Visit: Payer: Self-pay

## 2016-11-21 MED ORDER — OXYCODONE HCL 10 MG PO TABS: 10.0000 mg | ORAL_TABLET | Freq: Four times a day (QID) | ORAL | 0 refills | Status: DC | PRN

## 2016-11-21 NOTE — Telephone Encounter (Signed)
Printed.  Thanks.  

## 2016-11-21 NOTE — Telephone Encounter (Signed)
Patient notified by telephone that script is up front ready for pickup. 

## 2016-11-21 NOTE — Telephone Encounter (Signed)
Pt left v/m requesting rx for oxycodone. Call when ready for pick up. Last printed # 120 on 10/23/16. Last seen 09/25/16.

## 2016-11-22 ENCOUNTER — Other Ambulatory Visit: Payer: Self-pay | Admitting: Family Medicine

## 2016-11-22 NOTE — Telephone Encounter (Signed)
Electronic refill request. Last office visit:   09/25/16 Last Filled:    30 tablet 2 07/24/2016  Please advise.

## 2016-11-23 NOTE — Telephone Encounter (Signed)
Rx called in to requested pharmacy 

## 2016-11-23 NOTE — Telephone Encounter (Signed)
Please call in.  Thanks.   

## 2016-12-31 ENCOUNTER — Other Ambulatory Visit: Payer: Self-pay

## 2016-12-31 DIAGNOSIS — M545 Low back pain: Secondary | ICD-10-CM

## 2016-12-31 NOTE — Telephone Encounter (Signed)
Pt left v/m requesting rx for oxycodone. Call when ready for pick up. Last printed # 120 on 11/21/16. Pt last seen 09/25/16.

## 2017-01-01 DIAGNOSIS — M545 Low back pain, unspecified: Secondary | ICD-10-CM | POA: Insufficient documentation

## 2017-01-01 MED ORDER — OXYCODONE HCL 10 MG PO TABS: 10.0000 mg | ORAL_TABLET | Freq: Four times a day (QID) | ORAL | 0 refills | Status: DC | PRN

## 2017-01-01 NOTE — Telephone Encounter (Signed)
Printed.  Thanks.  

## 2017-01-01 NOTE — Telephone Encounter (Signed)
Patient advised.  Rx left at front desk for pick up. 

## 2017-01-23 ENCOUNTER — Emergency Department (HOSPITAL_COMMUNITY): Payer: Medicare Other

## 2017-01-23 ENCOUNTER — Other Ambulatory Visit: Payer: Self-pay

## 2017-01-23 ENCOUNTER — Observation Stay (HOSPITAL_COMMUNITY)
Admission: EM | Admit: 2017-01-23 | Discharge: 2017-01-25 | Disposition: A | Discharge status: Home / Self Care | Payer: Medicare Other | Attending: Internal Medicine | Admitting: Internal Medicine

## 2017-01-23 ENCOUNTER — Encounter (HOSPITAL_COMMUNITY): Payer: Self-pay | Admitting: Emergency Medicine

## 2017-01-23 DIAGNOSIS — W19XXXA Unspecified fall, initial encounter: Secondary | ICD-10-CM

## 2017-01-23 DIAGNOSIS — Z9981 Dependence on supplemental oxygen: Secondary | ICD-10-CM | POA: Insufficient documentation

## 2017-01-23 DIAGNOSIS — N179 Acute kidney failure, unspecified: Secondary | ICD-10-CM | POA: Insufficient documentation

## 2017-01-23 DIAGNOSIS — J9611 Chronic respiratory failure with hypoxia: Secondary | ICD-10-CM | POA: Diagnosis not present

## 2017-01-23 DIAGNOSIS — E785 Hyperlipidemia, unspecified: Secondary | ICD-10-CM | POA: Insufficient documentation

## 2017-01-23 DIAGNOSIS — R55 Syncope and collapse: Secondary | ICD-10-CM | POA: Insufficient documentation

## 2017-01-23 DIAGNOSIS — E039 Hypothyroidism, unspecified: Secondary | ICD-10-CM | POA: Diagnosis not present

## 2017-01-23 DIAGNOSIS — E876 Hypokalemia: Secondary | ICD-10-CM | POA: Diagnosis not present

## 2017-01-23 DIAGNOSIS — G894 Chronic pain syndrome: Secondary | ICD-10-CM | POA: Diagnosis not present

## 2017-01-23 DIAGNOSIS — F411 Generalized anxiety disorder: Secondary | ICD-10-CM | POA: Diagnosis not present

## 2017-01-23 DIAGNOSIS — K219 Gastro-esophageal reflux disease without esophagitis: Secondary | ICD-10-CM | POA: Diagnosis not present

## 2017-01-23 DIAGNOSIS — E559 Vitamin D deficiency, unspecified: Secondary | ICD-10-CM | POA: Diagnosis not present

## 2017-01-23 DIAGNOSIS — M797 Fibromyalgia: Secondary | ICD-10-CM | POA: Diagnosis present

## 2017-01-23 DIAGNOSIS — N390 Urinary tract infection, site not specified: Secondary | ICD-10-CM | POA: Diagnosis not present

## 2017-01-23 DIAGNOSIS — Z87891 Personal history of nicotine dependence: Secondary | ICD-10-CM | POA: Insufficient documentation

## 2017-01-23 DIAGNOSIS — M545 Low back pain: Secondary | ICD-10-CM

## 2017-01-23 DIAGNOSIS — Z79899 Other long term (current) drug therapy: Secondary | ICD-10-CM | POA: Diagnosis not present

## 2017-01-23 DIAGNOSIS — N189 Chronic kidney disease, unspecified: Secondary | ICD-10-CM | POA: Diagnosis not present

## 2017-01-23 DIAGNOSIS — J449 Chronic obstructive pulmonary disease, unspecified: Secondary | ICD-10-CM | POA: Diagnosis not present

## 2017-01-23 DIAGNOSIS — I129 Hypertensive chronic kidney disease with stage 1 through stage 4 chronic kidney disease, or unspecified chronic kidney disease: Secondary | ICD-10-CM | POA: Diagnosis not present

## 2017-01-23 DIAGNOSIS — Z8673 Personal history of transient ischemic attack (TIA), and cerebral infarction without residual deficits: Secondary | ICD-10-CM | POA: Insufficient documentation

## 2017-01-23 DIAGNOSIS — G4700 Insomnia, unspecified: Secondary | ICD-10-CM | POA: Insufficient documentation

## 2017-01-23 DIAGNOSIS — F419 Anxiety disorder, unspecified: Secondary | ICD-10-CM | POA: Diagnosis present

## 2017-01-23 LAB — BASIC METABOLIC PANEL
Anion gap: 7 (ref 5–15)
BUN: 16 mg/dL (ref 6–20)
CO2: 32 mmol/L (ref 22–32)
Calcium: 8.9 mg/dL (ref 8.9–10.3)
Chloride: 102 mmol/L (ref 101–111)
Creatinine, Ser: 1.18 mg/dL — ABNORMAL HIGH (ref 0.44–1.00)
GFR calc Af Amer: 48 mL/min — ABNORMAL LOW (ref >60)
GFR calc non Af Amer: 41 mL/min — ABNORMAL LOW (ref >60)
Glucose, Bld: 97 mg/dL (ref 65–99)
Potassium: 3.4 mmol/L — ABNORMAL LOW (ref 3.5–5.1)
Sodium: 141 mmol/L (ref 135–145)

## 2017-01-23 LAB — CBC
HCT: 31.6 % — ABNORMAL LOW (ref 36.0–46.0)
Hemoglobin: 9.8 g/dL — ABNORMAL LOW (ref 12.0–15.0)
MCH: 30.2 pg (ref 26.0–34.0)
MCHC: 31 g/dL (ref 30.0–36.0)
MCV: 97.2 fL (ref 78.0–100.0)
Platelets: 195 10*3/uL (ref 150–400)
RBC: 3.25 MIL/uL — ABNORMAL LOW (ref 3.87–5.11)
RDW: 14.1 % (ref 11.5–15.5)
WBC: 6.2 10*3/uL (ref 4.0–10.5)

## 2017-01-23 LAB — POC OCCULT BLOOD, ED: Fecal Occult Bld: NEGATIVE

## 2017-01-23 MED ORDER — SODIUM CHLORIDE 0.9 % IV BOLUS (SEPSIS)
1000.0000 mL | Freq: Once | INTRAVENOUS | Status: AC
  Administered 2017-01-23: 1000 mL via INTRAVENOUS

## 2017-01-23 NOTE — ED Provider Notes (Signed)
[facility] DEPT Provider Note   CSN: [id number] Arrival date & time: 01/23/17  2134     History   Chief Complaint Chief Complaint  Patient presents with  . Loss of Consciousness  . Fall    HPI [name] is a 83 y.o. female with history of fibromyalgia, chronic pain syndrome, COPD on 3L nasal cannula presents to the ED for evaluation after fall PTA. Patient remembers walking to get the doorbell and is not sure what happened next. She think she fell backwards landing on a glass lamp. She remembers waking up in the ambulance. Currently she reports back pain and bilateral shoulder pain that she attributes to her fibromyalgia, not significantly worse since the fall. She reports new right buttock pain since fall and feeling more tired today. Husband has been sick all day and she's been taking care of him, she thinks she may have been more tired today than usual because of this.   Does note she has been getting more SOB with exertion at home x 1 month. Also, thinks she may have a UTI , denies dysuria but reports malodorous urine and intermittent frequency. Denies preceding chest pain, shortness of breath, palpitations, lightheadedness, vision changes,  headache. No recent fever, flu symptoms, vomiting, diarrhea, melena, hematochezia, abdominal pain. Does not take any anticoagulants. No recent falls in the last 6 months.  HPI  Past Medical History:  Diagnosis Date  . Allergic rhinitis, cause unspecified   . Allergy, unspecified not elsewhere classified   . Anxiety   . Asthma   . Chronic kidney disease    frequency  . COPD (chronic obstructive pulmonary disease) ([place])   . Degeneration of intervertebral disc, site unspecified   . Diverticulosis of colon (without mention of hemorrhage)   . Esophageal reflux   . Headache   . Insomnia, unspecified   . Myalgia and myositis, unspecified   . Need for prophylactic hormone replacement therapy (postmenopausal)   . Osteoarthritis of  right knee 08/27/2011   R knee pain, s/p arthroscopy 2012 per [name]   . Osteoporosis, unspecified   . Other abnormal blood chemistry   . Other and unspecified hyperlipidemia   . Other chronic pain   . Stroke [facility]    mini strokes  . Unspecified essential hypertension   . Unspecified hypothyroidism     Patient Active Problem List   Diagnosis Date Noted  . Low back pain 01/01/2017  . Health care maintenance 09/26/2016  . Hard of hearing 09/26/2016  . Vitamin D deficiency 09/26/2016  . Advance care planning 09/26/2016  . Dysuria 09/26/2016  . Encounter for chronic pain management 07/26/2016  . Finger lesion 03/19/2015  . Edema 10/21/2014  . Headache 10/21/2014  . Multinodular thyroid 07/07/2013  . Abnormal CT scan 06/25/2013  . HTN (hypertension) 06/09/2013  . Chronic respiratory failure ([place]) 05/13/2013  . Cough 05/02/2013  . Other malaise and fatigue 12/31/2012  . Vertigo 12/31/2012  . Osteoarthritis of right knee 08/27/2011  . Gold C Copd with asthmatic bronchitis component and chronic resp failure 06/26/2011  . Neck pain 04/02/2011  . Onychomycosis 01/31/2011  . RISK OF FALLING 08/17/2010  . UNSPECIFIED URINARY INCONTINENCE 01/12/2010  . HYPOPOTASSEMIA 05/24/2009  . ANAL FISSURE 03/24/2009  . ANEMIA, MILD 04/10/2008  . URINARY TRACT INFECTION, ACUTE, RECURRENT 04/10/2008  . Anxiety state 03/01/2008  . Osteoporosis 06/23/2007  . Seasonal and perennial allergic rhinitis 04/15/2007  . Chronic pain syndrome 12/26/2006  . Hypothyroidism 12/24/2006  . HYPERLIPIDEMIA 12/24/2006  .  COMMON MIGRAINE 12/24/2006  . GERD 12/24/2006  . DIVERTICULOSIS, COLON 12/24/2006  . DEGENERATIVE DISC DISEASE 12/24/2006  . Fibromyalgia 12/24/2006  . Insomnia 12/24/2006  . HYPERGLYCEMIA 12/24/2006    Past Surgical History:  Procedure Laterality Date  . BACK SURGERY    . breast cystectomy    . JOINT REPLACEMENT     shoulder right  . KNEE ARTHROSCOPY     Right knee 2012  .  MASS EXCISION Left 04/07/2015   Procedure: MINOR EXCISION OF MASS LEFT SMALL FINGER;  Surgeon: Leanora Cover, MD;  Location: [facility];  Service: Orthopedics;  Laterality: Left;  . SHOULDER SURGERY    . TOTAL KNEE ARTHROPLASTY  12/11/2011   Procedure: TOTAL KNEE ARTHROPLASTY;  Surgeon: Johnny Bridge, MD;  Location: [facility];  Service: Orthopedics;  Laterality: Right;  . UMBILICAL HERNIA REPAIR N/A 06/09/2013   Procedure: EXPLORATIORY LAPAROTOMY, HERNIA REPAIR UMBILICAL, INSERTION OF MESH;  Surgeon: Haywood Lasso, MD;  Location: [place];  Service: General;  Laterality: N/A;    OB History    No data available       Home Medications    Prior to Admission medications   Medication Sig Start Date End Date Taking? Authorizing Provider  albuterol (PROVENTIL HFA;VENTOLIN HFA) 108 (90 Base) MCG/ACT inhaler Inhale 1-2 puffs into the lungs every 6 (six) hours as needed for wheezing or shortness of breath. 07/24/16   Tonia Ghent, MD  ALPRAZolam Duanne Moron) 1 MG tablet TAKE 1/2 TABLET BY MOUTH TWICE A DAY AS NEEDED AS DIRECTED. 11/23/16   Tonia Ghent, MD  amLODipine (NORVASC) 2.5 MG tablet Take 1 tablet (2.5 mg total) by mouth daily. 07/24/16   Tonia Ghent, MD  Calcium Carbonate (CALTRATE 600 PO) Take 1 tablet by mouth daily.      [provider]  cephALEXin (KEFLEX) 500 MG capsule Take 1 capsule (500 mg total) by mouth 2 (two) times daily. 10/25/16   Tonia Ghent, MD  cholecalciferol (VITAMIN D) 1000 units tablet Take 1 tablet (1,000 Units total) by mouth daily. 09/25/16   Tonia Ghent, MD  diclofenac sodium (VOLTAREN) 1 % GEL APPLY 4 GRAMS TO AFFECTED AREA 2 TIMES A DAY. 07/24/16   Tonia Ghent, MD  DULoxetine (CYMBALTA) 60 MG capsule Take 1 capsule (60 mg total) by mouth daily. 07/24/16   Tonia Ghent, MD  fluticasone (FLONASE) 50 MCG/ACT nasal spray PLACE 1 TO 2 SPRAYS IN BOTH NOSTRILS DAILY. 07/24/16   Tonia Ghent, MD  fluticasone furoate-vilanterol  (BREO ELLIPTA) 200-25 MCG/INH AEPB Inhale 1 puff into the lungs daily. 07/24/16   Tonia Ghent, MD  furosemide (LASIX) 20 MG tablet TAKE 1/2 TO 1 TABLET BY MOUTH EVERY MORNING AS NEEDED FOR SWELLING. USE SPARINGLY. 07/24/16   Tonia Ghent, MD  gabapentin (NEURONTIN) 300 MG capsule Take 2 capsules (600 mg total) by mouth 2 (two) times daily. 07/24/16   Tonia Ghent, MD  ibuprofen (ADVIL,MOTRIN) 200 MG tablet Take 400 mg by mouth every 6 (six) hours as needed for mild pain or moderate pain.    [provider]  levothyroxine (SYNTHROID, LEVOTHROID) 112 MCG tablet TAKE 1 TABLET BY MOUTH DAILY BEFORE BREAKFAST. 07/24/16   Tonia Ghent, MD  montelukast (SINGULAIR) 10 MG tablet Take 1 tablet (10 mg total) by mouth at bedtime. Patient taking differently: Take 10 mg by mouth as needed.  07/24/16   Tonia Ghent, MD  Multiple Vitamin (MULTIVITAMIN) capsule Take 1  capsule by mouth daily.      [provider]  NON FORMULARY Oxygen Use 3 liters 24/7    [provider]  Oxycodone HCl 10 MG TABS Take 1 tablet (10 mg total) by mouth 4 (four) times daily as needed (pain.sedation caution). 01/01/17   Tonia Ghent, MD  potassium chloride (K-DUR) 10 MEQ tablet Take 1 tablet (10 mEq total) by mouth daily. If taking lasix/furosemide 07/24/16   Tonia Ghent, MD  ranitidine (ZANTAC) 150 MG tablet Take 1 tablet (150 mg total) by mouth 2 (two) times daily. 07/24/16   Tonia Ghent, MD  tiotropium (SPIRIVA HANDIHALER) 18 MCG inhalation capsule PLACE 1 CAPSULE INTO INHALER AND INHALE ONCE A DAY Patient not taking: Reported on 09/25/2016 07/24/16   Tonia Ghent, MD  traZODone (DESYREL) 50 MG tablet TAKE 3 TABLETS (150 MG TOTAL) BY MOUTH AT BEDTIME AS NEEDED FOR SLEEP. 11/15/16   Tonia Ghent, MD    Family History Family History  Problem Relation Age of Onset  . Lung cancer Brother        x2  . Hypertension Father   . Heart disease Father   . Heart disease Mother     . Asthma Sister     Social History Social History  Substance Use Topics  . Smoking status: Former Smoker    Packs/day: 1.50    Years: 10.00    Types: Cigarettes    Quit date: 07/09/1985  . Smokeless tobacco: Never Used     Comment: 1 1/2 ppd x 20 years  . Alcohol use No     Allergies   Doxycycline and Sulfadiazine   Review of Systems Review of Systems  Constitutional: Negative for chills and fever.  HENT: Negative for congestion and sore throat.   Eyes: Negative for visual disturbance.  Respiratory: Negative for cough and shortness of breath.   Cardiovascular: Negative for chest pain and palpitations.  Gastrointestinal: Negative for abdominal pain, diarrhea, nausea and vomiting.  Genitourinary: Negative for difficulty urinating, dysuria and hematuria.  Musculoskeletal: Positive for arthralgias, back pain and myalgias.  Skin: Negative for color change.  Allergic/Immunologic: Negative for immunocompromised state.  Neurological: Positive for syncope. Negative for facial asymmetry, speech difficulty, weakness, numbness and headaches.     Physical Exam Updated Vital Signs BP 134/69   Pulse 63   Temp 98 F (36.7 C) (Oral)   Resp 16   SpO2 100%   Physical Exam  Constitutional: She is oriented to person, place, and time. She appears well-developed and well-nourished. No distress.  Pleasant female. A&O x self, place and time. NAD.  HENT:  Head: Normocephalic and atraumatic.  Nose: Nose normal.  Mouth/Throat: No oropharyngeal exudate.  Dry mucous membranes No intraoral bleeding or injury No skull or facial bone tenderness, crepitus or hematoma  Eyes: Conjunctivae are normal.  PERRL and EOMs intact bilaterally  Neck: Normal range of motion. Neck supple.  No midline or parapsinal cervical spine tenderness or crepitus Full AROM with reported neck pain  Cardiovascular: Normal rate, regular rhythm, normal heart sounds and intact distal pulses.   No murmur  heard. Pulmonary/Chest: Effort normal and breath sounds normal. No respiratory distress. She has no wheezes. She has no rales. She exhibits no tenderness.  Abdominal: Soft. Bowel sounds are normal. She exhibits no distension. There is tenderness in the suprapubic area. There is no CVA tenderness.  Genitourinary: Rectum normal. Rectal exam shows guaiac negative stool.  Musculoskeletal: Normal range of motion. She exhibits  tenderness. She exhibits no deformity.       Lumbar back: She exhibits tenderness.       Back:  Diffuse lumbar paraspinal muscular tenderness Bilateral ischial tenderness (R>L)  Decreased bilateral shoulder PROM, chronic Full PROM of LEs without pain  Lymphadenopathy:    She has no cervical adenopathy.  Neurological: She is alert and oriented to person, place, and time.  A&O to self, place and time. Speech and phonation normal.   Thought process coherent.   Strength 5/5 in upper and lower extremities.   Sensation to light touch intact in upper and lower extremities.  No truncal sway.   No leg drift.  Intact finger to nose test. CN I not tested CN II full visual fields  CN III, IV, VI PEERL and EOMs intact bilaterally CN V light touch intact in all 3 divisions of trigeminal nerve CN VII facial nerve movements intact, symmetric, bilaterally CN VIII hearing intact to finger rub, bilaterally CN IX, X no uvula deviation, symmetric soft palate rise CN XI 5/5 SCM and trapezius strength bilaterally  CN XII Tongue midline with symmetric L/R movement  Skin: Skin is warm and dry. Capillary refill takes less than 2 seconds.  No ecchymosis to back, buttocks, UE or LE  Psychiatric: She has a normal mood and affect. Her behavior is normal. Judgment and thought content normal.  Nursing note and vitals reviewed.    ED Treatments / Results  Labs (all labs ordered are listed, but only abnormal results are displayed) Labs Reviewed  BASIC METABOLIC PANEL - Abnormal; Notable for  the following:       Result Value   Potassium 3.4 (*)    Creatinine, Ser 1.18 (*)    GFR calc non Af Amer 41 (*)    GFR calc Af Amer 48 (*)    All other components within normal limits  CBC - Abnormal; Notable for the following:    RBC 3.25 (*)    Hemoglobin 9.8 (*)    HCT 31.6 (*)    All other components within normal limits  URINALYSIS, ROUTINE W REFLEX MICROSCOPIC - Abnormal; Notable for the following:    APPearance HAZY (*)    Leukocytes, UA LARGE (*)    Bacteria, UA RARE (*)    Squamous Epithelial / LPF 0-5 (*)    All other components within normal limits  POC OCCULT BLOOD, ED  I-STAT TROPONIN, ED  I-STAT CG4 LACTIC ACID, ED  CBG MONITORING, ED    EKG  EKG Interpretation  Date/Time:  Wednesday January 23 2017 21:34:14 EDT Ventricular Rate:  64 PR Interval:  202 QRS Duration: 78 QT Interval:  456 QTC Calculation: 470 R Axis:   6 Text Interpretation:  Normal sinus rhythm Anteroseptal infarct , age undetermined Abnormal ECG no significant change compared to 2016 Confirmed by Sherwood Gambler [phone]) on 01/23/2017 9:46:53 PM       Radiology Dg Chest 2 View  Result Date: 01/23/2017 CLINICAL DATA:  83 year old female with hypoxia, fall, and syncope. EXAM: CHEST  2 VIEW COMPARISON:  Chest radiograph dated 04/26/2014 FINDINGS: There are bibasilar subsegmental atelectasis/ scarring. No focal consolidation, pleural effusion, or pneumothorax. The cardiac silhouette is within normal limits. The aorta is tortuous. There is osteopenia with degenerative changes of the spine and shoulders. Right shoulder are prosthesis. No acute osseous pathology. IMPRESSION: No active cardiopulmonary disease. Electronically Signed   By: Anner Crete M.D.   On: 01/23/2017 23:57   Ct Head Wo Contrast  Result Date:  01/23/2017 CLINICAL DATA:  Fall EXAM: CT HEAD WITHOUT CONTRAST CT CERVICAL SPINE WITHOUT CONTRAST TECHNIQUE: Multidetector CT imaging of the head and cervical spine was performed following  the standard protocol without intravenous contrast. Multiplanar CT image reconstructions of the cervical spine were also generated. COMPARISON:  Head CT 12/20/2014 FINDINGS: CT HEAD FINDINGS Brain: No mass lesion, intraparenchymal hemorrhage or extra-axial collection. No evidence of acute cortical infarct. There is periventricular hypoattenuation compatible with chronic microvascular disease. There is an old left cerebellar infarct. Vascular: Atherosclerotic calcification of the internal carotid arteries at the skull base. Skull:  No skull fracture. Sinuses/Orbits: Frothy secretions in the dependent sphenoid sinus. Mild maxillary, ethmoid and frontal mucosal thickening. Normal orbits. CT CERVICAL SPINE FINDINGS Alignment: No static subluxation. Facets are aligned. Occipital condyles are normally positioned. Skull base and vertebrae: No acute fracture. Soft tissues and spinal canal: No prevertebral fluid or swelling. No visible canal hematoma. Disc levels: Multilevel degenerative disc disease. Moderate left neural foraminal narrowing C4-C5 due to facet and uncovertebral hypertrophy. No spinal canal stenosis. Upper chest: No pneumothorax, pulmonary nodule or pleural effusion. Other: Normal visualized paraspinal cervical soft tissues. IMPRESSION: 1. Chronic microvascular ischemia and old left cerebellar infarct without acute intracranial abnormality. 2. No acute fracture or static subluxation of the cervical spine. Electronically Signed   By: Ulyses Jarred M.D.   On: 01/23/2017 23:41   Ct Cervical Spine Wo Contrast  Result Date: 01/23/2017 CLINICAL DATA:  Fall EXAM: CT HEAD WITHOUT CONTRAST CT CERVICAL SPINE WITHOUT CONTRAST TECHNIQUE: Multidetector CT imaging of the head and cervical spine was performed following the standard protocol without intravenous contrast. Multiplanar CT image reconstructions of the cervical spine were also generated. COMPARISON:  Head CT 12/20/2014 FINDINGS: CT HEAD FINDINGS Brain: No  mass lesion, intraparenchymal hemorrhage or extra-axial collection. No evidence of acute cortical infarct. There is periventricular hypoattenuation compatible with chronic microvascular disease. There is an old left cerebellar infarct. Vascular: Atherosclerotic calcification of the internal carotid arteries at the skull base. Skull:  No skull fracture. Sinuses/Orbits: Frothy secretions in the dependent sphenoid sinus. Mild maxillary, ethmoid and frontal mucosal thickening. Normal orbits. CT CERVICAL SPINE FINDINGS Alignment: No static subluxation. Facets are aligned. Occipital condyles are normally positioned. Skull base and vertebrae: No acute fracture. Soft tissues and spinal canal: No prevertebral fluid or swelling. No visible canal hematoma. Disc levels: Multilevel degenerative disc disease. Moderate left neural foraminal narrowing C4-C5 due to facet and uncovertebral hypertrophy. No spinal canal stenosis. Upper chest: No pneumothorax, pulmonary nodule or pleural effusion. Other: Normal visualized paraspinal cervical soft tissues. IMPRESSION: 1. Chronic microvascular ischemia and old left cerebellar infarct without acute intracranial abnormality. 2. No acute fracture or static subluxation of the cervical spine. Electronically Signed   By: Ulyses Jarred M.D.   On: 01/23/2017 23:41   Dg Hips Bilat W Or Wo Pelvis 3-4 Views  Result Date: 01/24/2017 CLINICAL DATA:  83 year old female with fall and hip pain. EXAM: DG HIP (WITH OR WITHOUT PELVIS) 3-4V BILAT COMPARISON:  CT of the abdomen pelvis dated 06/08/2013 FINDINGS: There is no acute fracture or dislocation. The bones are osteopenic. Mild arthritic changes. There is degenerative changes of the lower lumbar spine. Moderate stool noted throughout the colon. The soft tissues appear unremarkable. IMPRESSION: No acute fracture or dislocation. Electronically Signed   By: Anner Crete M.D.   On: 01/24/2017 00:01    Procedures Procedures (including critical  care time)  Medications Ordered in ED Medications  sodium chloride 0.9 % bolus 1,000 mL (  0 mLs Intravenous Stopped 01/24/17 0052)     Initial Impression / Assessment and Plan / ED Course  I have reviewed the triage vital signs and the nursing notes.  Pertinent labs & imaging results that were available during my care of the patient were reviewed by me and considered in my medical decision making (see chart for details).  Clinical Course as of Jan 25 143  Wed Jan 23, 2017  2337 Hemoglobin: (!) 9.8 [CG]  Thu Jan 24, 2017  0000 Fecal Occult Blood, POC: NEGATIVE [CG]  0044 Leukocytes, UA: (!) LARGE [CG]  0044 WBC, UA: TOO NUMEROUS TO COUNT [CG]  0137 EKG reviewed, not transfering ED EKG [CG]  0142 Patient now agreeable to stay.  [CG]    Clinical Course User Index [CG] Kinnie Feil, PA-C   84 yo female with history of fibromyalgia, chronic pain syndrome, COPD on 3L nasal cannula presents to the ED for evaluation after fall PTA. No prodromal symptoms prior to fall. She reports UTI symptoms for the past week and worsening exertional shortness of breath for the past one month. Dry mucous membranes. Mild suprapubic tenderness, no CVAT. No focal neurological deficits. Mild bilateral ischial tenderness, w/o evidence of significant MSK injury. She has been ambulating in the ED without difficulty. Normal pulse ox while ambulating. Concerned for vasovagal versus vs orthostatic vs cardiac syncope. She does appear dry on exam. No chest pain, shortness of breath, palpitations before fall, less likely ACS. Clinical picture not c/w pulmonary embolism. We'll get screening labs, chest x-ray, EKG, urinalysis and reassess.  Imaging unremarkable. EKG nonischemic without arrhythmias. NSR while on cardiac monitor in ED. Chest x-ray normal without rib fx or fluid. CBC remarkable for anemia with hemoglobin 9.8, not significantly lower from most recent CBC. BMP without significant electrolyte abnormalities,  acceptable creatinine. Urinalysis shows large leukocytes, too numerous to count WBCs and rare bacteria, given UTI symptoms patient likely has a UTI. Hemeoccult negative. Pending i-STAT troponin, although patient never had preceding CP.  Final Clinical Impressions(s) / ED Diagnoses   Final diagnoses:  Syncope and collapse  Fall, initial encounter   I spoke to patient regarding likely admission for observation and telemetry.  Given her age, syncopal episode, UTI she is high risk for complications and she would benefit from obs and tele. Pt initially agreeable but now states she would rather be discharged home. Will finish IV fluids and follow up on troponin and lactic acid and reassess.  New Prescriptions New Prescriptions   No medications on file     Arlean Hopping 01/24/17 0144    Sherwood Gambler, MD 01/24/17 [phone]

## 2017-01-23 NOTE — ED Notes (Addendum)
Patient transported to CT 

## 2017-01-23 NOTE — ED Notes (Signed)
ED Provider at bedside. 

## 2017-01-23 NOTE — ED Triage Notes (Signed)
Per EMS pt from home. Tonight lost consciousness and fell breaking a lamp. Fell onto carpet. No neck or back pain. No lacerations from broken glass of lamp noted.  Normally on home O2 d/t COPD. Not on O2 when EMS arrived and sats were 82% on RA.  Initial BP 92/50.  Pt received 500 NS bolus en route. CBG 113.  Pt does not remember falling. No complaints at this time.

## 2017-01-23 NOTE — ED Notes (Signed)
Pt O2 sat decreased to 75% on RA. Pt placed on 4 L [place] O2 sat increased to 90%. Dr. Regenia Skeeter aware. Pt O2 sats now at 100% on 4L [place], decreased to 3L.

## 2017-01-23 NOTE — ED Notes (Signed)
Pt in XR at this time

## 2017-01-24 ENCOUNTER — Encounter (HOSPITAL_COMMUNITY): Payer: Self-pay

## 2017-01-24 DIAGNOSIS — R55 Syncope and collapse: Secondary | ICD-10-CM | POA: Diagnosis not present

## 2017-01-24 DIAGNOSIS — W19XXXA Unspecified fall, initial encounter: Secondary | ICD-10-CM

## 2017-01-24 DIAGNOSIS — N179 Acute kidney failure, unspecified: Secondary | ICD-10-CM | POA: Diagnosis present

## 2017-01-24 DIAGNOSIS — N390 Urinary tract infection, site not specified: Secondary | ICD-10-CM

## 2017-01-24 DIAGNOSIS — M797 Fibromyalgia: Secondary | ICD-10-CM | POA: Diagnosis not present

## 2017-01-24 DIAGNOSIS — J449 Chronic obstructive pulmonary disease, unspecified: Secondary | ICD-10-CM

## 2017-01-24 DIAGNOSIS — F411 Generalized anxiety disorder: Secondary | ICD-10-CM

## 2017-01-24 DIAGNOSIS — G894 Chronic pain syndrome: Secondary | ICD-10-CM | POA: Diagnosis not present

## 2017-01-24 HISTORY — DX: Syncope and collapse: R55

## 2017-01-24 LAB — URINALYSIS, ROUTINE W REFLEX MICROSCOPIC
Bilirubin Urine: NEGATIVE
Glucose, UA: NEGATIVE mg/dL
Hgb urine dipstick: NEGATIVE
Ketones, ur: NEGATIVE mg/dL
Nitrite: NEGATIVE
Protein, ur: NEGATIVE mg/dL
Specific Gravity, Urine: 1.009 (ref 1.005–1.030)
pH: 7 (ref 5.0–8.0)

## 2017-01-24 LAB — BASIC METABOLIC PANEL
Anion gap: 5 (ref 5–15)
BUN: 13 mg/dL (ref 6–20)
CO2: 34 mmol/L — ABNORMAL HIGH (ref 22–32)
Calcium: 8.3 mg/dL — ABNORMAL LOW (ref 8.9–10.3)
Chloride: 103 mmol/L (ref 101–111)
Creatinine, Ser: 0.85 mg/dL (ref 0.44–1.00)
GFR calc Af Amer: >60 mL/min (ref >60)
GFR calc non Af Amer: >60 mL/min (ref >60)
Glucose, Bld: 94 mg/dL (ref 65–99)
Potassium: 3.4 mmol/L — ABNORMAL LOW (ref 3.5–5.1)
Sodium: 142 mmol/L (ref 135–145)

## 2017-01-24 LAB — CBC
HCT: 31.7 % — ABNORMAL LOW (ref 36.0–46.0)
Hemoglobin: 9.7 g/dL — ABNORMAL LOW (ref 12.0–15.0)
MCH: 29.8 pg (ref 26.0–34.0)
MCHC: 30.6 g/dL (ref 30.0–36.0)
MCV: 97.5 fL (ref 78.0–100.0)
Platelets: 163 10*3/uL (ref 150–400)
RBC: 3.25 MIL/uL — ABNORMAL LOW (ref 3.87–5.11)
RDW: 14.1 % (ref 11.5–15.5)
WBC: 5 10*3/uL (ref 4.0–10.5)

## 2017-01-24 LAB — TSH: TSH: 2.653 u[IU]/mL (ref 0.350–4.500)

## 2017-01-24 LAB — I-STAT CG4 LACTIC ACID, ED
Lactic Acid, Venous: 0.6 mmol/L (ref 0.5–1.9)
Lactic Acid, Venous: 0.56 mmol/L (ref 0.5–1.9)

## 2017-01-24 LAB — I-STAT TROPONIN, ED: Troponin i, poc: 0 ng/mL (ref 0.00–0.08)

## 2017-01-24 LAB — TROPONIN I: Troponin I: <0.03 ng/mL (ref <0.03)

## 2017-01-24 MED ORDER — ACETAMINOPHEN 650 MG RE SUPP: 650.0000 mg | Freq: Four times a day (QID) | RECTAL | Status: DC | PRN

## 2017-01-24 MED ORDER — ALPRAZOLAM 0.5 MG PO TABS: 0.5000 mg | ORAL_TABLET | Freq: Two times a day (BID) | ORAL | Status: DC | PRN

## 2017-01-24 MED ORDER — ENOXAPARIN SODIUM 40 MG/0.4ML ~~LOC~~ SOLN
40.0000 mg | SUBCUTANEOUS | Status: DC
  Administered 2017-01-24: 40 mg via SUBCUTANEOUS
  Filled 2017-01-24 (×2): qty 0.4

## 2017-01-24 MED ORDER — KETOTIFEN FUMARATE 0.025 % OP SOLN
1.0000 [drp] | Freq: Two times a day (BID) | OPHTHALMIC | Status: DC
  Administered 2017-01-24: 2 [drp] via OPHTHALMIC
  Administered 2017-01-25: 1 [drp] via OPHTHALMIC
  Filled 2017-01-24: qty 5

## 2017-01-24 MED ORDER — TIOTROPIUM BROMIDE MONOHYDRATE 18 MCG IN CAPS
18.0000 ug | ORAL_CAPSULE | Freq: Every day | RESPIRATORY_TRACT | Status: DC
  Administered 2017-01-24 – 2017-01-25 (×2): 18 ug via RESPIRATORY_TRACT
  Filled 2017-01-24 (×2): qty 5

## 2017-01-24 MED ORDER — SODIUM CHLORIDE 0.9 % IV SOLN
INTRAVENOUS | Status: DC
  Administered 2017-01-24 (×3): via INTRAVENOUS

## 2017-01-24 MED ORDER — ONDANSETRON HCL 4 MG PO TABS
4.0000 mg | ORAL_TABLET | Freq: Four times a day (QID) | ORAL | Status: DC | PRN
Start: 2017-01-24 — End: 2017-01-25

## 2017-01-24 MED ORDER — FLUTICASONE PROPIONATE 50 MCG/ACT NA SUSP: 1.0000 | Freq: Every day | NASAL | Status: DC | PRN

## 2017-01-24 MED ORDER — AMLODIPINE BESYLATE 2.5 MG PO TABS
2.5000 mg | ORAL_TABLET | Freq: Every day | ORAL | Status: DC
  Administered 2017-01-24 – 2017-01-25 (×2): 2.5 mg via ORAL
  Filled 2017-01-24 (×2): qty 1

## 2017-01-24 MED ORDER — TRAZODONE HCL 100 MG PO TABS
100.0000 mg | ORAL_TABLET | Freq: Two times a day (BID) | ORAL | Status: DC
  Administered 2017-01-24: 100 mg via ORAL
  Filled 2017-01-24: qty 1
  Filled 2017-01-24: qty 2

## 2017-01-24 MED ORDER — ALBUTEROL SULFATE HFA 108 (90 BASE) MCG/ACT IN AERS: 1.0000 | INHALATION_SPRAY | Freq: Four times a day (QID) | RESPIRATORY_TRACT | Status: DC | PRN

## 2017-01-24 MED ORDER — DICLOFENAC SODIUM 1 % TD GEL: 4.0000 g | Freq: Two times a day (BID) | TRANSDERMAL | Status: DC | PRN

## 2017-01-24 MED ORDER — DULOXETINE HCL 60 MG PO CPEP
60.0000 mg | ORAL_CAPSULE | Freq: Every day | ORAL | Status: DC
  Administered 2017-01-24 – 2017-01-25 (×2): 60 mg via ORAL
  Filled 2017-01-24 (×2): qty 1

## 2017-01-24 MED ORDER — ALBUTEROL SULFATE (2.5 MG/3ML) 0.083% IN NEBU: 2.5000 mg | INHALATION_SOLUTION | RESPIRATORY_TRACT | Status: DC | PRN

## 2017-01-24 MED ORDER — GABAPENTIN 300 MG PO CAPS
300.0000 mg | ORAL_CAPSULE | Freq: Three times a day (TID) | ORAL | Status: DC
  Administered 2017-01-24 – 2017-01-25 (×4): 300 mg via ORAL
  Filled 2017-01-24 (×4): qty 1

## 2017-01-24 MED ORDER — ONDANSETRON HCL 4 MG/2ML IJ SOLN: 4.0000 mg | Freq: Four times a day (QID) | INTRAMUSCULAR | Status: DC | PRN

## 2017-01-24 MED ORDER — DEXTROSE 5 % IV SOLN
2.0000 g | Freq: Once | INTRAVENOUS | Status: AC
  Administered 2017-01-24: 2 g via INTRAVENOUS
  Filled 2017-01-24: qty 2

## 2017-01-24 MED ORDER — MONTELUKAST SODIUM 10 MG PO TABS: 10.0000 mg | ORAL_TABLET | Freq: Every day | ORAL | Status: DC | PRN

## 2017-01-24 MED ORDER — FLUTICASONE FUROATE-VILANTEROL 200-25 MCG/INH IN AEPB: 1.0000 | INHALATION_SPRAY | Freq: Every day | RESPIRATORY_TRACT | Status: DC | PRN

## 2017-01-24 MED ORDER — LEVOTHYROXINE SODIUM 112 MCG PO TABS
112.0000 ug | ORAL_TABLET | Freq: Every day | ORAL | Status: DC
  Administered 2017-01-24 – 2017-01-25 (×2): 112 ug via ORAL
  Filled 2017-01-24 (×2): qty 1

## 2017-01-24 MED ORDER — ACETAMINOPHEN 325 MG PO TABS
650.0000 mg | ORAL_TABLET | Freq: Four times a day (QID) | ORAL | Status: DC | PRN
  Administered 2017-01-24: 650 mg via ORAL
  Filled 2017-01-24: qty 2

## 2017-01-24 MED ORDER — POTASSIUM CHLORIDE CRYS ER 10 MEQ PO TBCR: 10.0000 meq | EXTENDED_RELEASE_TABLET | Freq: Every day | ORAL | Status: DC | PRN

## 2017-01-24 MED ORDER — POTASSIUM CHLORIDE CRYS ER 20 MEQ PO TBCR
40.0000 meq | EXTENDED_RELEASE_TABLET | Freq: Once | ORAL | Status: AC
  Administered 2017-01-24: 40 meq via ORAL
  Filled 2017-01-24: qty 2

## 2017-01-24 MED ORDER — DEXTROSE 5 % IV SOLN
1.0000 g | INTRAVENOUS | Status: DC
  Administered 2017-01-25: 1 g via INTRAVENOUS
  Filled 2017-01-24: qty 10

## 2017-01-24 MED ORDER — FAMOTIDINE 20 MG PO TABS
20.0000 mg | ORAL_TABLET | Freq: Every day | ORAL | Status: DC
  Administered 2017-01-24 – 2017-01-25 (×2): 20 mg via ORAL
  Filled 2017-01-24 (×2): qty 1

## 2017-01-24 MED ORDER — OXYCODONE HCL 5 MG PO TABS
10.0000 mg | ORAL_TABLET | Freq: Two times a day (BID) | ORAL | Status: DC | PRN
  Administered 2017-01-25: 10 mg via ORAL
  Filled 2017-01-24: qty 2

## 2017-01-24 NOTE — ED Notes (Signed)
Pt. Agreed to stay.

## 2017-01-24 NOTE — ED Notes (Signed)
Notified again about missing inhaler.

## 2017-01-24 NOTE — ED Notes (Signed)
Admitting at bedside 

## 2017-01-24 NOTE — ED Notes (Signed)
Place ahigh risk band on right wrist

## 2017-01-24 NOTE — Progress Notes (Signed)
Pharmacy Antibiotic Note  [name] is a 83 y.o. female admitted on 01/23/2017 with UTI.  Pharmacy has been consulted for Rocephin dosing.  Plan: Rocephin 1g IV Q24H  Pharmacy will sign off.  Temp (24hrs), Avg:98 F (36.7 C), Min:98 F (36.7 C), Max:98 F (36.7 C)   Recent Labs Lab 01/23/17 2227  WBC 6.2  CREATININE 1.18*     Allergies  Allergen Reactions  . Doxycycline     GI upset  . Sulfadiazine     REACTION: Fever, achey    Thank you for allowing pharmacy to be a part of this patient's care.  Wynona Neat, PharmD, BCPS  01/24/2017 2:07 AM

## 2017-01-24 NOTE — ED Notes (Addendum)
Admitting coming to see patient. Wants orthostatic vital signs.   Pt. Wheeled to restroom on oxygen.

## 2017-01-24 NOTE — ED Notes (Signed)
Pt's O2 stayed between 96 and 99 while ambulating on 4L

## 2017-01-24 NOTE — ED Notes (Signed)
ED Provider at bedside. 

## 2017-01-24 NOTE — Care Management (Signed)
ED CM spoke with Dr. Karleen Hampshire who states patient has become orthostatic, and now has decided to stay to be observed. Patient has HTN, COPD and is on oxygen dependent on 3L. Dr. Karleen Hampshire has mentioned that she will order a portable oxygen concentrator for discharge. Unit CM will follow for transitional care needs.

## 2017-01-24 NOTE — Progress Notes (Signed)
Patient awake on the phone with spouse. No acute distress noted. Patient educated on bed alarm use.

## 2017-01-24 NOTE — ED Notes (Signed)
Lunch tray ordered; heart healthy diet 

## 2017-01-24 NOTE — ED Notes (Signed)
Pt. Transported to restroom with wheelchair with IV pole and oxygen. Pt. Refused to use bed pan or bedside commode. RN remained with patient in restroom.

## 2017-01-24 NOTE — ED Notes (Signed)
Pt. Wanting to go home and wants to see MD. Hospitalist paged.

## 2017-01-24 NOTE — H&P (Signed)
History and Physical    [name] [name] DOB: [birth year] DOA: 01/23/2017  Referring MD/NP/PA: Carmon Sails, PA-C PCP: Tonia Ghent, MD  Patient coming from: Home via EMS  Chief Complaint: Fall  HPI: [name] is a 83 y.o. female with medical history significant of HTN, COPD, oxygen dependent on 3 L, fibromyalgia, chronic pain, and hypothyroidism; who presents possibly passing out and falling to the floor.  She lives at home with her husband whom she has been caring for as he is ill. Patient remembers getting up to answer the doorbell and the next thing she recalls is waking up with EMS surrounding her. It is unknown how long the patient was unconscious. Patient notes that she knocked over a nearby lamp as looking glass was scattered around her. She landed on carpet and denies any significant pain in her head or neck. Denies last many any lacerations or cuts on the broken glass. Patient complains of muscle and joint pain that she thinks is similar to her fibromyalgia. Associated symptoms include shortness of breath with exertion, urinary frequency, foul smelling urine. She denies having any significant chest pain, dysuria, fever, change in appetite, blood in stool/urine, focal weakness, change in vision, recent falls, abdominal pain, nausea, or vomiting.  ED Course: UA was positive for signs of infection. Patient was given 1 L of normal saline IV fluids along with Rocephin.   Review of Systems: Review of Systems  Constitutional: Negative for chills and fever.  HENT: Negative for congestion and sinus pain.   Eyes: Negative for photophobia and pain.  Respiratory: Positive for shortness of breath.   Cardiovascular: Negative for chest pain.  Gastrointestinal: Negative for blood in stool, diarrhea, nausea and vomiting.  Genitourinary: Positive for frequency.  Musculoskeletal: Positive for falls, joint pain and myalgias.  Neurological: Positive for loss of consciousness.  Negative for sensory change.  Endo/Heme/Allergies: Negative for polydipsia.  All other systems reviewed and are negative.   Past Medical History:  Diagnosis Date  . Allergic rhinitis, cause unspecified   . Allergy, unspecified not elsewhere classified   . Anxiety   . Asthma   . Chronic kidney disease    frequency  . COPD (chronic obstructive pulmonary disease) ([place])   . Degeneration of intervertebral disc, site unspecified   . Diverticulosis of colon (without mention of hemorrhage)   . Esophageal reflux   . Headache   . Insomnia, unspecified   . Myalgia and myositis, unspecified   . Need for prophylactic hormone replacement therapy (postmenopausal)   . Osteoarthritis of right knee 08/27/2011   R knee pain, s/p arthroscopy 2012 per [name]   . Osteoporosis, unspecified   . Other abnormal blood chemistry   . Other and unspecified hyperlipidemia   . Other chronic pain   . Stroke [facility])    mini strokes  . Unspecified essential hypertension   . Unspecified hypothyroidism     Past Surgical History:  Procedure Laterality Date  . BACK SURGERY    . breast cystectomy    . JOINT REPLACEMENT     shoulder right  . KNEE ARTHROSCOPY     Right knee 2012  . MASS EXCISION Left 04/07/2015   Procedure: MINOR EXCISION OF MASS LEFT SMALL FINGER;  Surgeon: Leanora Cover, MD;  Location: [facility];  Service: Orthopedics;  Laterality: Left;  . SHOULDER SURGERY    . TOTAL KNEE ARTHROPLASTY  12/11/2011   Procedure: TOTAL KNEE ARTHROPLASTY;  Surgeon: Johnny Bridge, MD;  Location:  [facility];  Service: Orthopedics;  Laterality: Right;  . UMBILICAL HERNIA REPAIR N/A 06/09/2013   Procedure: EXPLORATIORY LAPAROTOMY, HERNIA REPAIR UMBILICAL, INSERTION OF MESH;  Surgeon: Haywood Lasso, MD;  Location: [facility];  Service: General;  Laterality: N/A;     reports that she quit smoking about 31 years ago. Her smoking use included Cigarettes. She has a 15.00 pack-year smoking history. She has  never used smokeless tobacco. She reports that she does not drink alcohol or use drugs.  Allergies  Allergen Reactions  . Doxycycline     GI upset  . Sulfadiazine     REACTION: Fever, achey    Family History  Problem Relation Age of Onset  . Lung cancer Brother        x2  . Hypertension Father   . Heart disease Father   . Heart disease Mother   . Asthma Sister     Prior to Admission medications   Medication Sig Start Date End Date Taking? Authorizing Provider  albuterol (PROVENTIL HFA;VENTOLIN HFA) 108 (90 Base) MCG/ACT inhaler Inhale 1-2 puffs into the lungs every 6 (six) hours as needed for wheezing or shortness of breath. 07/24/16   Tonia Ghent, MD  ALPRAZolam Duanne Moron) 1 MG tablet TAKE 1/2 TABLET BY MOUTH TWICE A DAY AS NEEDED AS DIRECTED. 11/23/16   Tonia Ghent, MD  amLODipine (NORVASC) 2.5 MG tablet Take 1 tablet (2.5 mg total) by mouth daily. 07/24/16   Tonia Ghent, MD  Calcium Carbonate (CALTRATE 600 PO) Take 1 tablet by mouth daily.      [provider]  cephALEXin (KEFLEX) 500 MG capsule Take 1 capsule (500 mg total) by mouth 2 (two) times daily. 10/25/16   Tonia Ghent, MD  cholecalciferol (VITAMIN D) 1000 units tablet Take 1 tablet (1,000 Units total) by mouth daily. 09/25/16   Tonia Ghent, MD  diclofenac sodium (VOLTAREN) 1 % GEL APPLY 4 GRAMS TO AFFECTED AREA 2 TIMES A DAY. 07/24/16   Tonia Ghent, MD  DULoxetine (CYMBALTA) 60 MG capsule Take 1 capsule (60 mg total) by mouth daily. 07/24/16   Tonia Ghent, MD  fluticasone (FLONASE) 50 MCG/ACT nasal spray PLACE 1 TO 2 SPRAYS IN BOTH NOSTRILS DAILY. 07/24/16   Tonia Ghent, MD  fluticasone furoate-vilanterol (BREO ELLIPTA) 200-25 MCG/INH AEPB Inhale 1 puff into the lungs daily. 07/24/16   Tonia Ghent, MD  furosemide (LASIX) 20 MG tablet TAKE 1/2 TO 1 TABLET BY MOUTH EVERY MORNING AS NEEDED FOR SWELLING. USE SPARINGLY. 07/24/16   Tonia Ghent, MD  gabapentin (NEURONTIN) 300 MG  capsule Take 2 capsules (600 mg total) by mouth 2 (two) times daily. 07/24/16   Tonia Ghent, MD  ibuprofen (ADVIL,MOTRIN) 200 MG tablet Take 400 mg by mouth every 6 (six) hours as needed for mild pain or moderate pain.    [provider]  levothyroxine (SYNTHROID, LEVOTHROID) 112 MCG tablet TAKE 1 TABLET BY MOUTH DAILY BEFORE BREAKFAST. 07/24/16   Tonia Ghent, MD  montelukast (SINGULAIR) 10 MG tablet Take 1 tablet (10 mg total) by mouth at bedtime. Patient taking differently: Take 10 mg by mouth as needed.  07/24/16   Tonia Ghent, MD  Multiple Vitamin (MULTIVITAMIN) capsule Take 1 capsule by mouth daily.      [provider]  NON FORMULARY Oxygen Use 3 liters 24/7    [provider]  Oxycodone HCl 10 MG TABS Take 1 tablet (10 mg total) by mouth  4 (four) times daily as needed (pain.sedation caution). 01/01/17   Tonia Ghent, MD  potassium chloride (K-DUR) 10 MEQ tablet Take 1 tablet (10 mEq total) by mouth daily. If taking lasix/furosemide 07/24/16   Tonia Ghent, MD  ranitidine (ZANTAC) 150 MG tablet Take 1 tablet (150 mg total) by mouth 2 (two) times daily. 07/24/16   Tonia Ghent, MD  tiotropium (SPIRIVA HANDIHALER) 18 MCG inhalation capsule PLACE 1 CAPSULE INTO INHALER AND INHALE ONCE A DAY Patient not taking: Reported on 09/25/2016 07/24/16   Tonia Ghent, MD  traZODone (DESYREL) 50 MG tablet TAKE 3 TABLETS (150 MG TOTAL) BY MOUTH AT BEDTIME AS NEEDED FOR SLEEP. 11/15/16   Tonia Ghent, MD    Physical Exam:  Constitutional: Elderly female NAD, calm, comfortable Vitals:   01/24/17 0000 01/24/17 0115 01/24/17 0130 01/24/17 0200  BP: (!) 150/67 128/71 134/69 133/67  Pulse: 62 62 63 61  Resp: 16   15  Temp:      TempSrc:      SpO2: 100% 100% 100% 100%   Eyes: PERRL, lids and conjunctivae normal ENMT: Mucous membranes are dry. Posterior pharynx clear of any exudate or lesions. Neck: normal, supple, no masses, no  thyromegaly Respiratory: clear to auscultation bilaterally, no wheezing, no crackles. Normal respiratory effort. No accessory muscle use.  Cardiovascular: Regular rate and rhythm, no murmurs / rubs / gallops. No extremity edema. 2+ pedal pulses. No carotid bruits.  Abdomen: Positive suprapubic tenderness, no masses palpated. No hepatosplenomegaly. Bowel sounds positive.  Musculoskeletal: no clubbing / cyanosis. Tenderness to palpation of the paraspinal muscles lumbar spine Skin: no rashes, lesions, ulcers. No induration Neurologic: CN 2-12 grossly intact. Sensation intact, DTR normal. Strength 5/5 in all 4.  Psychiatric: Normal judgment and insight. Alert and oriented x 3. Normal mood.     Labs on Admission: I have personally reviewed following labs and imaging studies  CBC:  Recent Labs Lab 01/23/17 2227  WBC 6.2  HGB 9.8*  HCT 31.6*  MCV 97.2  PLT 672   Basic Metabolic Panel:  Recent Labs Lab 01/23/17 2227  NA 141  K 3.4*  CL 102  CO2 32  GLUCOSE 97  BUN 16  CREATININE 1.18*  CALCIUM 8.9   GFR: CrCl cannot be calculated (Unknown ideal weight.). Liver Function Tests: No results for input(s): AST, ALT, ALKPHOS, BILITOT, PROT, ALBUMIN in the last 168 hours. No results for input(s): LIPASE, AMYLASE in the last 168 hours. No results for input(s): AMMONIA in the last 168 hours. Coagulation Profile: No results for input(s): INR, PROTIME in the last 168 hours. Cardiac Enzymes: No results for input(s): CKTOTAL, CKMB, CKMBINDEX, TROPONINI in the last 168 hours. BNP (last 3 results) No results for input(s): PROBNP in the last 8760 hours. HbA1C: No results for input(s): HGBA1C in the last 72 hours. CBG: No results for input(s): GLUCAP in the last 168 hours. Lipid Profile: No results for input(s): CHOL, HDL, LDLCALC, TRIG, CHOLHDL, LDLDIRECT in the last 72 hours. Thyroid Function Tests: No results for input(s): TSH, T4TOTAL, FREET4, T3FREE, THYROIDAB in the last 72  hours. Anemia Panel: No results for input(s): VITAMINB12, FOLATE, FERRITIN, TIBC, IRON, RETICCTPCT in the last 72 hours. Urine analysis:    Component Value Date/Time   COLORURINE YELLOW 01/24/2017 0020   APPEARANCEUR HAZY (A) 01/24/2017 0020   LABSPEC 1.009 01/24/2017 0020   PHURINE 7.0 01/24/2017 0020   GLUCOSEU NEGATIVE 01/24/2017 0020   GLUCOSEU NEGATIVE 10/24/2016 1429   HGBUR NEGATIVE  01/24/2017 0020   BILIRUBINUR NEGATIVE 01/24/2017 0020   BILIRUBINUR negative 09/20/2016 1625   KETONESUR NEGATIVE 01/24/2017 0020   PROTEINUR NEGATIVE 01/24/2017 0020   UROBILINOGEN 0.2 10/24/2016 1429   NITRITE NEGATIVE 01/24/2017 0020   LEUKOCYTESUR LARGE (A) 01/24/2017 0020   Sepsis Labs: No results found for this or any previous visit (from the past 240 hour(s)).   Radiological Exams on Admission: Dg Chest 2 View  Result Date: 01/23/2017 CLINICAL DATA:  83 year old female with hypoxia, fall, and syncope. EXAM: CHEST  2 VIEW COMPARISON:  Chest radiograph dated 04/26/2014 FINDINGS: There are bibasilar subsegmental atelectasis/ scarring. No focal consolidation, pleural effusion, or pneumothorax. The cardiac silhouette is within normal limits. The aorta is tortuous. There is osteopenia with degenerative changes of the spine and shoulders. Right shoulder are prosthesis. No acute osseous pathology. IMPRESSION: No active cardiopulmonary disease. Electronically Signed   By: Anner Crete M.D.   On: 01/23/2017 23:57   Ct Head Wo Contrast  Result Date: 01/23/2017 CLINICAL DATA:  Fall EXAM: CT HEAD WITHOUT CONTRAST CT CERVICAL SPINE WITHOUT CONTRAST TECHNIQUE: Multidetector CT imaging of the head and cervical spine was performed following the standard protocol without intravenous contrast. Multiplanar CT image reconstructions of the cervical spine were also generated. COMPARISON:  Head CT 12/20/2014 FINDINGS: CT HEAD FINDINGS Brain: No mass lesion, intraparenchymal hemorrhage or extra-axial collection.  No evidence of acute cortical infarct. There is periventricular hypoattenuation compatible with chronic microvascular disease. There is an old left cerebellar infarct. Vascular: Atherosclerotic calcification of the internal carotid arteries at the skull base. Skull:  No skull fracture. Sinuses/Orbits: Frothy secretions in the dependent sphenoid sinus. Mild maxillary, ethmoid and frontal mucosal thickening. Normal orbits. CT CERVICAL SPINE FINDINGS Alignment: No static subluxation. Facets are aligned. Occipital condyles are normally positioned. Skull base and vertebrae: No acute fracture. Soft tissues and spinal canal: No prevertebral fluid or swelling. No visible canal hematoma. Disc levels: Multilevel degenerative disc disease. Moderate left neural foraminal narrowing C4-C5 due to facet and uncovertebral hypertrophy. No spinal canal stenosis. Upper chest: No pneumothorax, pulmonary nodule or pleural effusion. Other: Normal visualized paraspinal cervical soft tissues. IMPRESSION: 1. Chronic microvascular ischemia and old left cerebellar infarct without acute intracranial abnormality. 2. No acute fracture or static subluxation of the cervical spine. Electronically Signed   By: Ulyses Jarred M.D.   On: 01/23/2017 23:41   Ct Cervical Spine Wo Contrast  Result Date: 01/23/2017 CLINICAL DATA:  Fall EXAM: CT HEAD WITHOUT CONTRAST CT CERVICAL SPINE WITHOUT CONTRAST TECHNIQUE: Multidetector CT imaging of the head and cervical spine was performed following the standard protocol without intravenous contrast. Multiplanar CT image reconstructions of the cervical spine were also generated. COMPARISON:  Head CT 12/20/2014 FINDINGS: CT HEAD FINDINGS Brain: No mass lesion, intraparenchymal hemorrhage or extra-axial collection. No evidence of acute cortical infarct. There is periventricular hypoattenuation compatible with chronic microvascular disease. There is an old left cerebellar infarct. Vascular: Atherosclerotic  calcification of the internal carotid arteries at the skull base. Skull:  No skull fracture. Sinuses/Orbits: Frothy secretions in the dependent sphenoid sinus. Mild maxillary, ethmoid and frontal mucosal thickening. Normal orbits. CT CERVICAL SPINE FINDINGS Alignment: No static subluxation. Facets are aligned. Occipital condyles are normally positioned. Skull base and vertebrae: No acute fracture. Soft tissues and spinal canal: No prevertebral fluid or swelling. No visible canal hematoma. Disc levels: Multilevel degenerative disc disease. Moderate left neural foraminal narrowing C4-C5 due to facet and uncovertebral hypertrophy. No spinal canal stenosis. Upper chest: No pneumothorax, pulmonary nodule or  pleural effusion. Other: Normal visualized paraspinal cervical soft tissues. IMPRESSION: 1. Chronic microvascular ischemia and old left cerebellar infarct without acute intracranial abnormality. 2. No acute fracture or static subluxation of the cervical spine. Electronically Signed   By: Ulyses Jarred M.D.   On: 01/23/2017 23:41   Dg Hips Bilat W Or Wo Pelvis 3-4 Views  Result Date: 01/24/2017 CLINICAL DATA:  83 year old female with fall and hip pain. EXAM: DG HIP (WITH OR WITHOUT PELVIS) 3-4V BILAT COMPARISON:  CT of the abdomen pelvis dated 06/08/2013 FINDINGS: There is no acute fracture or dislocation. The bones are osteopenic. Mild arthritic changes. There is degenerative changes of the lower lumbar spine. Moderate stool noted throughout the colon. The soft tissues appear unremarkable. IMPRESSION: No acute fracture or dislocation. Electronically Signed   By: Anner Crete M.D.   On: 01/24/2017 00:01    EKG: Independently reviewed. Normal sinus rhythm similar to previous EKGs.  Assessment/Plan Syncope and collapse: Acute. Patient was noted to have fallen after getting up to answer the door. Suspect orthostatic hypotension as possible cause of syncope. Also on the differential includes possible  arrhythmia vs medication - Admit to a telemetry bed - Recheck troponin - Check orthostatic vital signs  - IV Fluids NS at 75 ml/hr - Follow-up telemetry overnight  Urinary tract infection without hematuria : Acute. Patient with complaints of urinary frequency. UA positive for signs of infection. - Follow-up urine culture - Rocephin IV   Acute kidney injury: Patient presents with creatinine 1.18 and BUN 16. Baseline creatinine previously 0.77. - IV Fluids - Recheck BMP in a.m.  COPD with chronic respiratory failure: Stable. Patient on 3 L of nasal Oxygen:  - Continue  3L of O2, Breo, Sprivia,  fluticasone, Singulair    Essential hypertension - Continue amlodipine - Held furosemide 2/2 AKI  Fibromyalgia/chronic pain - Continue Cymbalta and oxycodone  Prn  Normocytic normochromic anemia: Hemoglobin 9.8 on admission which appears new patient baseline.  - recheck CBC  Anxiety - Continue Xanax prn   Hypothyroidism - Check TSH - Continue levothyroxine  DVT prophylaxis: Lovenox   Code Status: Full Family Communication: No family present at bedside Disposition Plan: TBD  Consults called: none Admission status: Observation  Norval Morton MD [facility] Pager [phone]  If 7PM-7AM, please contact night-coverage [email or web address] Password [email or web address]  01/24/2017, 2:27 AM

## 2017-01-24 NOTE — Care Management (Signed)
ED CM met with patient at bedside to discuss obs status, upon walking in room patient states, "I am not staying, I feel better now"  CM explained that she would be leaving AMA if Admitting Physician is not in agreement. CM will contact Dr. Karleen Hampshire to determine plan.

## 2017-01-24 NOTE — ED Notes (Signed)
Phlebotomy at bedside.

## 2017-01-24 NOTE — ED Notes (Signed)
Updated daughter with patient approval

## 2017-01-24 NOTE — ED Notes (Signed)
Have not received spiriva after requesting from pharmacy. RT made aware.

## 2017-01-24 NOTE — Progress Notes (Signed)
[name] is a 83 y.o. female with medical history significant of HTN, COPD, oxygen dependent on 3 L, fibromyalgia, chronic pain, and hypothyroidism was brought in for syncopal episode, was found to be hypotensive and has UTI.    PLAN: Admit to telemetry.  Serial troponins.  Recheck orthostatic vital signs in am.  Continue with IV fluids.  Get urine cultures, resume rocephin.  Get TSH .     Hosie Poisson, MD [phone]

## 2017-01-24 NOTE — ED Notes (Signed)
Pt wheeled to bathroom. Pt ok on toilet by self. Pt on O2, pt initiated that she was ok by herself and will pull call bell when she is finished.

## 2017-01-25 DIAGNOSIS — R55 Syncope and collapse: Secondary | ICD-10-CM | POA: Diagnosis not present

## 2017-01-25 DIAGNOSIS — N179 Acute kidney failure, unspecified: Secondary | ICD-10-CM

## 2017-01-25 DIAGNOSIS — G894 Chronic pain syndrome: Secondary | ICD-10-CM | POA: Diagnosis not present

## 2017-01-25 MED ORDER — CEPHALEXIN 500 MG PO CAPS: 500.0000 mg | ORAL_CAPSULE | Freq: Two times a day (BID) | ORAL | 0 refills | Status: AC

## 2017-01-25 MED ORDER — FUROSEMIDE 20 MG PO TABS: ORAL_TABLET | ORAL | 0 refills | Status: DC

## 2017-01-25 MED ORDER — OXYCODONE HCL 10 MG PO TABS: 10.0000 mg | ORAL_TABLET | Freq: Two times a day (BID) | ORAL | 0 refills | Status: DC | PRN

## 2017-01-25 NOTE — Progress Notes (Signed)
[facility] called for portable oxygen tank for transportation home today; Aneta Mins [phone]

## 2017-01-25 NOTE — Progress Notes (Signed)
Daughter at bedside requesting spiritual consult for creation of health care power of attorney,  Consult placed 7/19 Will re page    Awa Bachicha Leory Plowman

## 2017-01-25 NOTE — Progress Notes (Signed)
Chaplain Note:  Advanced Directive:  Assisted patient to complete a HCPOA with her daughter as her 24.  Patient and daughter given original and three copies. One copy provided to Unit Secretary to be scanned into chart.   Sue Lush  # [phone]

## 2017-01-25 NOTE — Evaluation (Signed)
Physical Therapy Evaluation Patient Details Name: [name] MRN: [id number] DOB: [birth year] Today's Date: 01/25/2017   History of Present Illness  83 y.o. female with medical history significant of HTN, COPD, oxygen dependent on 3 L, fibromyalgia, chronic pain, and hypothyroidism; who presents with syncope. Dx of UTI, hypotension.   Clinical Impression  Pt ambulated 150' with RW and 3L O2 with no loss of balance. She denied dizziness throughout PT eval. From PT standpoint, she is ready to DC home. HHPT recommended due to h/o 2 falls in past 1 year. No further acute PT indicated.  PT signing off.     Follow Up Recommendations Home health PT    Equipment Recommendations  None recommended by PT    Recommendations for Other Services       Precautions / Restrictions Precautions Precautions: Fall Precaution Comments: 2 falls in past 1 year (including fall just before this admission) Restrictions Weight Bearing Restrictions: No      Mobility  Bed Mobility Overal bed mobility: Independent                Transfers Overall transfer level: Independent                  Ambulation/Gait Ambulation/Gait assistance: Supervision Ambulation Distance (Feet): 150 Feet Assistive device: Rolling walker (2 wheeled) Gait Pattern/deviations: Step-through pattern   Gait velocity interpretation: at or above normal speed for age/gender General Gait Details: steady, no LOB, ambulated with 3L O2 with 0/4 dyspnea, supervision for safety  Stairs            Wheelchair Mobility    Modified Rankin (Stroke Patients Only)       Balance Overall balance assessment: History of Falls;Needs assistance   Sitting balance-Leahy Scale: Good       Standing balance-Leahy Scale: Fair                               Pertinent Vitals/Pain Pain Assessment: No/denies pain    Home Living Family/patient expects to be discharged to:: Private residence Living  Arrangements: Spouse/significant other Available Help at Discharge: Family;Available PRN/intermittently Type of Home: House Home Access: Ramped entrance     Home Layout: One level Home Equipment: Walker - 2 wheels;Cane - single point;Tub bench;Hand held shower head      Prior Function Level of Independence: Independent         Comments: walks without AD, often holds onto walls/furniture, on 3L O2 at baseline, 2 daughters are local (one works, one is retired)     Journalist, newspaper   Dominant Hand: Right    Extremity/Trunk Assessment   Upper Extremity Assessment Upper Extremity Assessment: Overall WFL for tasks assessed    Lower Extremity Assessment Lower Extremity Assessment: Overall WFL for tasks assessed    Cervical / Trunk Assessment Cervical / Trunk Assessment: Normal  Communication   Communication: HOH  Cognition Arousal/Alertness: Awake/alert Behavior During Therapy: WFL for tasks assessed/performed Overall Cognitive Status: Within Functional Limits for tasks assessed                                 General Comments: daughter provided some details of PLOF      General Comments      Exercises     Assessment/Plan    PT Assessment All further PT needs can be met in the next venue of care  PT Problem List Decreased balance       PT Treatment Interventions      PT Goals (Current goals can be found in the Care Plan section)  Acute Rehab PT Goals Patient Stated Goal: go home PT Goal Formulation: All assessment and education complete, DC therapy    Frequency     Barriers to discharge        Co-evaluation               AM-PAC PT "6 Clicks" Daily Activity  Outcome Measure Difficulty turning over in bed (including adjusting bedclothes, sheets and blankets)?: None Difficulty moving from lying on back to sitting on the side of the bed? : None Difficulty sitting down on and standing up from a chair with arms (e.g., wheelchair,  bedside commode, etc,.)?: None Help needed moving to and from a bed to chair (including a wheelchair)?: A Little Help needed walking in hospital room?: A Little Help needed climbing 3-5 steps with a railing? : A Little 6 Click Score: 21    End of Session Equipment Utilized During Treatment: Gait belt;Oxygen Activity Tolerance: Patient tolerated treatment well;No increased pain Patient left: in bed;with call bell/phone within reach;with family/visitor present Nurse Communication: Mobility status PT Visit Diagnosis: History of falling (Z91.81)    Time: 5072-2575 PT Time Calculation (min) (ACUTE ONLY): 23 min   Charges:   PT Evaluation $PT Eval Low Complexity: 1 Procedure PT Treatments $Gait Training: 8-22 mins   PT G Codes:   PT G-Codes **NOT FOR INPATIENT CLASS** Functional Assessment Tool Used: AM-PAC 6 Clicks Basic Mobility Functional Limitation: Mobility: Walking and moving around Mobility: Walking and Moving Around Current Status (Y5183): At least 20 percent but less than 40 percent impaired, limited or restricted Mobility: Walking and Moving Around Goal Status [phone]): At least 20 percent but less than 40 percent impaired, limited or restricted      Philomena Doheny 01/25/2017, 9:28 AM [phone]

## 2017-01-25 NOTE — Progress Notes (Signed)
Discharge education provided to pt family at bedside. Pt discharged via wheelchair with nursing tech on home oxygen. Pt has all belongings.    [name] [name]

## 2017-01-25 NOTE — Progress Notes (Signed)
Pt family requesting small oxygen tank for home. Pt family states MD stated she would go home with one. Pt family refused tall rolling tank, states they have one at one home. Informed family that she will need to bring oxygen from home when picking up pt. Family stated "well now I need the oxygen tank." Made case manager aware. Case manager talked to family member. Case manager arranging second delivery of oxygen tank   Teonna Coonan Leory Plowman

## 2017-01-25 NOTE — Progress Notes (Signed)
Pt IV discontinued, catheter intact and telemetry removed. Pt has all belongings including home oxygen tank. Pt discharge education provided at bedside. Awaiting pt transportation   [place]

## 2017-01-27 NOTE — Discharge Summary (Signed)
Physician Discharge Summary  [name] KNL:[id number] DOB: [birth year] DOA: 01/23/2017  PCP: Tonia Ghent, MD  Admit date: 01/23/2017 Discharge date: 01/25/2017  Admitted From: Home Disposition:  Home  Recommendations for Outpatient Follow-up:  1. Follow up with PCP in 1-2 weeks 2. Please obtain BMP/CBC in one week   Home Health: yes Equipment/Devices:oxygen with concentrator.   Discharge Condition: stable.  CODE STATUS: full code.  Diet recommendation: Heart Healthy   Brief/Interim Summary: [name] a 83 y.o.femalewith medical history significant of HTN, COPD, oxygen dependent on 3 L, fibromyalgia, chronic pain, and hypothyroidism was brought in for syncopal episode, was found to be hypotensive and has UTI.   Discharge Diagnoses:  Principal Problem:   Syncope and collapse Active Problems:   Anxiety state   Chronic pain syndrome   Fibromyalgia   Gold C Copd with asthmatic bronchitis component and chronic resp failure   Acute lower UTI   AKI (acute kidney injury) ([place])  Syncope secondary to orthostatic hypotension > Started him on iv fluids and hydrated .  Repeat orthostatics negative.  Overnight tele unremarkable.     Uti:  Cultures grew multiple bacteria.  Discharged her on oral keflex.    Copd;  Baseline oxygen requirement is 3 lit /min.  No wheezing heard.    Chronic pain syndrome:  Pain control.    AKI: Probably from dehydration and orthostatic hypotension.  Repeat level are back to baseline.   Hypokalemia: resolved.    Discharge Instructions  Discharge Instructions    Diet - low sodium heart healthy    Complete by:  As directed    Discharge instructions    Complete by:  As directed    Please follow up with PCP in one week.     Allergies as of 01/25/2017      Reactions   Doxycycline    GI upset   Sulfadiazine    REACTION: Fever, achey      Medication List    STOP taking these medications   ibuprofen 200 MG  tablet Commonly known as:  ADVIL,MOTRIN     TAKE these medications   albuterol 108 (90 Base) MCG/ACT inhaler Commonly known as:  PROVENTIL HFA;VENTOLIN HFA Inhale 1-2 puffs into the lungs every 6 (six) hours as needed for wheezing or shortness of breath.   ALPRAZolam 1 MG tablet Commonly known as:  XANAX TAKE 1/2 TABLET BY MOUTH TWICE A DAY AS NEEDED AS DIRECTED.   amLODipine 2.5 MG tablet Commonly known as:  NORVASC Take 1 tablet (2.5 mg total) by mouth daily.   CALTRATE 600 PO Take 1 tablet by mouth daily.   cephALEXin 500 MG capsule Commonly known as:  KEFLEX Take 1 capsule (500 mg total) by mouth 2 (two) times daily.   cholecalciferol 1000 units tablet Commonly known as:  VITAMIN D Take 1 tablet (1,000 Units total) by mouth daily.   diclofenac sodium 1 % Gel Commonly known as:  VOLTAREN APPLY 4 GRAMS TO AFFECTED AREA 2 TIMES A DAY. What changed:  how much to take  how to take this  when to take this  reasons to take this  additional instructions   DULoxetine 60 MG capsule Commonly known as:  CYMBALTA Take 1 capsule (60 mg total) by mouth daily.   fluticasone 50 MCG/ACT nasal spray Commonly known as:  FLONASE PLACE 1 TO 2 SPRAYS IN BOTH NOSTRILS DAILY. What changed:  how much to take  how to take this  when to take  this  reasons to take this  additional instructions   fluticasone furoate-vilanterol 200-25 MCG/INH Aepb Commonly known as:  BREO ELLIPTA Inhale 1 puff into the lungs daily. What changed:  when to take this  reasons to take this   furosemide 20 MG tablet Commonly known as:  LASIX TAKE 1/2 TO 1 TABLET BY MOUTH EVERY MORNING AS NEEDED FOR SWELLING. USE SPARINGLY.  Hold it for one week from now. Start taking on:  02/01/2017 What changed:  additional instructions  These instructions start on 02/01/2017. If you are unsure what to do until then, ask your doctor or other care provider.   gabapentin 300 MG capsule Commonly known  as:  NEURONTIN Take 2 capsules (600 mg total) by mouth 2 (two) times daily. What changed:  how much to take  when to take this   levothyroxine 112 MCG tablet Commonly known as:  SYNTHROID, LEVOTHROID TAKE 1 TABLET BY MOUTH DAILY BEFORE BREAKFAST.   montelukast 10 MG tablet Commonly known as:  SINGULAIR Take 1 tablet (10 mg total) by mouth at bedtime. What changed:  when to take this  reasons to take this   multivitamin capsule Take 1 capsule by mouth daily.   NON FORMULARY Oxygen Use 3 liters 24/7   Oxycodone HCl 10 MG Tabs Take 1 tablet (10 mg total) by mouth 2 (two) times daily as needed (pain.sedation caution). What changed:  when to take this   potassium chloride 10 MEQ tablet Commonly known as:  K-DUR Take 1 tablet (10 mEq total) by mouth daily. If taking lasix/furosemide What changed:  when to take this  reasons to take this  additional instructions   ranitidine 150 MG tablet Commonly known as:  ZANTAC Take 1 tablet (150 mg total) by mouth 2 (two) times daily. What changed:  when to take this   tiotropium 18 MCG inhalation capsule Commonly known as:  SPIRIVA HANDIHALER PLACE 1 CAPSULE INTO INHALER AND INHALE ONCE A DAY   traZODone 50 MG tablet Commonly known as:  DESYREL TAKE 3 TABLETS (150 MG TOTAL) BY MOUTH AT BEDTIME AS NEEDED FOR SLEEP. What changed:  See the new instructions.      Follow-up Information    Tonia Ghent, MD. Schedule an appointment as soon as possible for a visit on 02/01/2017.   Specialty:  Family Medicine Why:  Follow up @ 2pm Contact information: [address] [place] [address] [phone]          Allergies  Allergen Reactions  . Doxycycline     GI upset  . Sulfadiazine     REACTION: Fever, achey    Consultations:  None.    Procedures/Studies: Dg Chest 2 View  Result Date: 01/23/2017 CLINICAL DATA:  83 year old female with hypoxia, fall, and syncope. EXAM: CHEST  2 VIEW COMPARISON:   Chest radiograph dated 04/26/2014 FINDINGS: There are bibasilar subsegmental atelectasis/ scarring. No focal consolidation, pleural effusion, or pneumothorax. The cardiac silhouette is within normal limits. The aorta is tortuous. There is osteopenia with degenerative changes of the spine and shoulders. Right shoulder are prosthesis. No acute osseous pathology. IMPRESSION: No active cardiopulmonary disease. Electronically Signed   By: Anner Crete M.D.   On: 01/23/2017 23:57   Ct Head Wo Contrast  Result Date: 01/23/2017 CLINICAL DATA:  Fall EXAM: CT HEAD WITHOUT CONTRAST CT CERVICAL SPINE WITHOUT CONTRAST TECHNIQUE: Multidetector CT imaging of the head and cervical spine was performed following the standard protocol without intravenous contrast. Multiplanar CT image reconstructions  of the cervical spine were also generated. COMPARISON:  Head CT 12/20/2014 FINDINGS: CT HEAD FINDINGS Brain: No mass lesion, intraparenchymal hemorrhage or extra-axial collection. No evidence of acute cortical infarct. There is periventricular hypoattenuation compatible with chronic microvascular disease. There is an old left cerebellar infarct. Vascular: Atherosclerotic calcification of the internal carotid arteries at the skull base. Skull:  No skull fracture. Sinuses/Orbits: Frothy secretions in the dependent sphenoid sinus. Mild maxillary, ethmoid and frontal mucosal thickening. Normal orbits. CT CERVICAL SPINE FINDINGS Alignment: No static subluxation. Facets are aligned. Occipital condyles are normally positioned. Skull base and vertebrae: No acute fracture. Soft tissues and spinal canal: No prevertebral fluid or swelling. No visible canal hematoma. Disc levels: Multilevel degenerative disc disease. Moderate left neural foraminal narrowing C4-C5 due to facet and uncovertebral hypertrophy. No spinal canal stenosis. Upper chest: No pneumothorax, pulmonary nodule or pleural effusion. Other: Normal visualized paraspinal cervical  soft tissues. IMPRESSION: 1. Chronic microvascular ischemia and old left cerebellar infarct without acute intracranial abnormality. 2. No acute fracture or static subluxation of the cervical spine. Electronically Signed   By: Ulyses Jarred M.D.   On: 01/23/2017 23:41   Ct Cervical Spine Wo Contrast  Result Date: 01/23/2017 CLINICAL DATA:  Fall EXAM: CT HEAD WITHOUT CONTRAST CT CERVICAL SPINE WITHOUT CONTRAST TECHNIQUE: Multidetector CT imaging of the head and cervical spine was performed following the standard protocol without intravenous contrast. Multiplanar CT image reconstructions of the cervical spine were also generated. COMPARISON:  Head CT 12/20/2014 FINDINGS: CT HEAD FINDINGS Brain: No mass lesion, intraparenchymal hemorrhage or extra-axial collection. No evidence of acute cortical infarct. There is periventricular hypoattenuation compatible with chronic microvascular disease. There is an old left cerebellar infarct. Vascular: Atherosclerotic calcification of the internal carotid arteries at the skull base. Skull:  No skull fracture. Sinuses/Orbits: Frothy secretions in the dependent sphenoid sinus. Mild maxillary, ethmoid and frontal mucosal thickening. Normal orbits. CT CERVICAL SPINE FINDINGS Alignment: No static subluxation. Facets are aligned. Occipital condyles are normally positioned. Skull base and vertebrae: No acute fracture. Soft tissues and spinal canal: No prevertebral fluid or swelling. No visible canal hematoma. Disc levels: Multilevel degenerative disc disease. Moderate left neural foraminal narrowing C4-C5 due to facet and uncovertebral hypertrophy. No spinal canal stenosis. Upper chest: No pneumothorax, pulmonary nodule or pleural effusion. Other: Normal visualized paraspinal cervical soft tissues. IMPRESSION: 1. Chronic microvascular ischemia and old left cerebellar infarct without acute intracranial abnormality. 2. No acute fracture or static subluxation of the cervical spine.  Electronically Signed   By: Ulyses Jarred M.D.   On: 01/23/2017 23:41   Dg Hips Bilat W Or Wo Pelvis 3-4 Views  Result Date: 01/24/2017 CLINICAL DATA:  83 year old female with fall and hip pain. EXAM: DG HIP (WITH OR WITHOUT PELVIS) 3-4V BILAT COMPARISON:  CT of the abdomen pelvis dated 06/08/2013 FINDINGS: There is no acute fracture or dislocation. The bones are osteopenic. Mild arthritic changes. There is degenerative changes of the lower lumbar spine. Moderate stool noted throughout the colon. The soft tissues appear unremarkable. IMPRESSION: No acute fracture or dislocation. Electronically Signed   By: Anner Crete M.D.   On: 01/24/2017 00:01        Subjective: No new complaints.   Discharge Exam: Vitals:   01/25/17 0001 01/25/17 0530  BP: (!) 104/47 (!) 140/36  Pulse:    Resp: 17 16  Temp: 97.8 F (36.6 C) 98.2 F (36.8 C)   Vitals:   01/24/17 1538 01/24/17 2100 01/25/17 0001 01/25/17 0530  BP: Marland Kitchen)  151/61 (!) 147/78 (!) 104/47 (!) 140/36  Pulse: 86 72    Resp: (!) 22 18 17 16   Temp: 98.6 F (37 C) 98 F (36.7 C) 97.8 F (36.6 C) 98.2 F (36.8 C)  TempSrc: Oral Oral Oral Oral  SpO2: 95% 97% 98% 99%  Weight: 66.9 kg (147 lb 9 oz)   68.6 kg (151 lb 3.2 oz)  Height: 5' (1.524 m)       General: Pt is alert, awake, not in acute distress Cardiovascular: RRR, S1/S2 +, no rubs, no gallops Respiratory: CTA bilaterally, no wheezing, no rhonchi Abdominal: Soft, NT, ND, bowel sounds + Extremities: no edema, no cyanosis    The results of significant diagnostics from this hospitalization (including imaging, microbiology, ancillary and laboratory) are listed below for reference.     Microbiology: No results found for this or any previous visit (from the past 240 hour(s)).   Labs: BNP (last 3 results) No results for input(s): BNP in the last 8760 hours. Basic Metabolic Panel:  Recent Labs Lab 01/23/17 2227 01/24/17 0324  NA 141 142  K 3.4* 3.4*  CL 102 103   CO2 32 34*  GLUCOSE 97 94  BUN 16 13  CREATININE 1.18* 0.85  CALCIUM 8.9 8.3*   Liver Function Tests: No results for input(s): AST, ALT, ALKPHOS, BILITOT, PROT, ALBUMIN in the last 168 hours. No results for input(s): LIPASE, AMYLASE in the last 168 hours. No results for input(s): AMMONIA in the last 168 hours. CBC:  Recent Labs Lab 01/23/17 2227 01/24/17 0324  WBC 6.2 5.0  HGB 9.8* 9.7*  HCT 31.6* 31.7*  MCV 97.2 97.5  PLT 195 163   Cardiac Enzymes:  Recent Labs Lab 01/24/17 0324  TROPONINI <0.03   BNP: Invalid input(s): POCBNP CBG: No results for input(s): GLUCAP in the last 168 hours. D-Dimer No results for input(s): DDIMER in the last 72 hours. Hgb A1c No results for input(s): HGBA1C in the last 72 hours. Lipid Profile No results for input(s): CHOL, HDL, LDLCALC, TRIG, CHOLHDL, LDLDIRECT in the last 72 hours. Thyroid function studies No results for input(s): TSH, T4TOTAL, T3FREE, THYROIDAB in the last 72 hours.  Invalid input(s): FREET3 Anemia work up No results for input(s): VITAMINB12, FOLATE, FERRITIN, TIBC, IRON, RETICCTPCT in the last 72 hours. Urinalysis    Component Value Date/Time   COLORURINE YELLOW 01/24/2017 0020   APPEARANCEUR HAZY (A) 01/24/2017 0020   LABSPEC 1.009 01/24/2017 0020   PHURINE 7.0 01/24/2017 0020   GLUCOSEU NEGATIVE 01/24/2017 0020   GLUCOSEU NEGATIVE 10/24/2016 1429   HGBUR NEGATIVE 01/24/2017 0020   BILIRUBINUR NEGATIVE 01/24/2017 0020   BILIRUBINUR negative 09/20/2016 1625   KETONESUR NEGATIVE 01/24/2017 0020   PROTEINUR NEGATIVE 01/24/2017 0020   UROBILINOGEN 0.2 10/24/2016 1429   NITRITE NEGATIVE 01/24/2017 0020   LEUKOCYTESUR LARGE (A) 01/24/2017 0020   Sepsis Labs Invalid input(s): PROCALCITONIN,  WBC,  LACTICIDVEN Microbiology No results found for this or any previous visit (from the past 240 hour(s)).   Time coordinating discharge: Over 30 minutes  SIGNED:   Hosie Poisson, MD  [facility] 01/27/2017, 10:55 PM Pager   If 7PM-7AM, please contact night-coverage [email or web address] Password [email or web address]

## 2017-01-28 ENCOUNTER — Telehealth: Payer: Self-pay | Admitting: *Deleted

## 2017-01-28 NOTE — Telephone Encounter (Signed)
Transition Care Management Follow-up Telephone Call   Date discharged? 01/25/2017   How have you been since you were released from the hospital? Doing well per patient   Do you understand why you were in the hospital?  YES   Do you understand the discharge instructions? YES   Where were you discharged to? HOME   Items Reviewed:  Medications reviewed: YES  Allergies reviewed: YES  Dietary changes reviewed: YES  Referrals reviewed: YES   Functional Questionnaire:   Activities of Daily Living (ADLs):   She states they are independent in the following: grooming, dressing, ambulation, bathing , feeding, continence States they require assistance with the following: No assistance needed   Any transportation issues/concerns?: NO, patient did need appointment changed from 02/01/17 because daughter is unable to bring her that day.   Any patient concerns? NO   Confirmed importance and date/time of follow-up visits scheduled YES  Provider Appointment booked with DR Damita Dunnings 02/04/17 at 11:30  Confirmed with patient if condition begins to worsen call PCP or go to the ER.  Patient was given the office number and encouraged to call back with question or concerns.  : YES

## 2017-02-01 ENCOUNTER — Ambulatory Visit: Payer: Medicare Other | Admitting: Family Medicine

## 2017-02-04 ENCOUNTER — Encounter: Payer: Self-pay | Admitting: Family Medicine

## 2017-02-04 ENCOUNTER — Ambulatory Visit (INDEPENDENT_AMBULATORY_CARE_PROVIDER_SITE_OTHER): Payer: Medicare Other | Admitting: Family Medicine

## 2017-02-04 VITALS — BP 150/80 | HR 81 | Temp 98.0°F | Wt 149.8 lb

## 2017-02-04 DIAGNOSIS — R55 Syncope and collapse: Secondary | ICD-10-CM | POA: Diagnosis not present

## 2017-02-04 DIAGNOSIS — J9611 Chronic respiratory failure with hypoxia: Secondary | ICD-10-CM | POA: Diagnosis not present

## 2017-02-04 DIAGNOSIS — N390 Urinary tract infection, site not specified: Secondary | ICD-10-CM

## 2017-02-04 DIAGNOSIS — G8929 Other chronic pain: Secondary | ICD-10-CM

## 2017-02-04 DIAGNOSIS — M545 Low back pain: Secondary | ICD-10-CM | POA: Diagnosis not present

## 2017-02-04 LAB — BASIC METABOLIC PANEL
BUN: 15 mg/dL (ref 6–23)
CO2: 35 mEq/L — ABNORMAL HIGH (ref 19–32)
Calcium: 9.7 mg/dL (ref 8.4–10.5)
Chloride: 100 mEq/L (ref 96–112)
Creatinine, Ser: 0.72 mg/dL (ref 0.40–1.20)
GFR: 82.02 mL/min (ref >60.00)
Glucose, Bld: 95 mg/dL (ref 70–99)
Potassium: 3.4 mEq/L — ABNORMAL LOW (ref 3.5–5.1)
Sodium: 141 mEq/L (ref 135–145)

## 2017-02-04 LAB — CBC WITH DIFFERENTIAL/PLATELET
Basophils Absolute: 0 10*3/uL (ref 0.0–0.1)
Basophils Relative: 0.8 % (ref 0.0–3.0)
Eosinophils Absolute: 0.2 10*3/uL (ref 0.0–0.7)
Eosinophils Relative: 3.3 % (ref 0.0–5.0)
HCT: 34.2 % — ABNORMAL LOW (ref 36.0–46.0)
Hemoglobin: 11.3 g/dL — ABNORMAL LOW (ref 12.0–15.0)
Lymphocytes Relative: 39.4 % (ref 12.0–46.0)
Lymphs Abs: 2.1 10*3/uL (ref 0.7–4.0)
MCHC: 33 g/dL (ref 30.0–36.0)
MCV: 96.8 fl (ref 78.0–100.0)
Monocytes Absolute: 0.4 10*3/uL (ref 0.1–1.0)
Monocytes Relative: 7.4 % (ref 3.0–12.0)
Neutro Abs: 2.6 10*3/uL (ref 1.4–7.7)
Neutrophils Relative %: 49.1 % (ref 43.0–77.0)
Platelets: 233 10*3/uL (ref 150.0–400.0)
RBC: 3.53 Mil/uL — ABNORMAL LOW (ref 3.87–5.11)
RDW: 14 % (ref 11.5–15.5)
WBC: 5.2 10*3/uL (ref 4.0–10.5)

## 2017-02-04 MED ORDER — MONTELUKAST SODIUM 10 MG PO TABS: 10.0000 mg | ORAL_TABLET | Freq: Every day | ORAL | Status: DC | PRN

## 2017-02-04 MED ORDER — GABAPENTIN 300 MG PO CAPS
300.0000 mg | ORAL_CAPSULE | Freq: Three times a day (TID) | ORAL | Status: DC
Start: 2017-02-04 — End: 2017-09-19

## 2017-02-04 MED ORDER — RANITIDINE HCL 150 MG PO TABS: 150.0000 mg | ORAL_TABLET | Freq: Every day | ORAL | Status: DC

## 2017-02-04 MED ORDER — DICLOFENAC SODIUM 1 % TD GEL: 4.0000 g | Freq: Two times a day (BID) | TRANSDERMAL | Status: DC | PRN

## 2017-02-04 MED ORDER — TRAZODONE HCL 50 MG PO TABS: ORAL_TABLET | ORAL | Status: DC

## 2017-02-04 MED ORDER — POTASSIUM CHLORIDE ER 10 MEQ PO TBCR: 10.0000 meq | EXTENDED_RELEASE_TABLET | Freq: Every day | ORAL | Status: DC | PRN

## 2017-02-04 MED ORDER — OXYCODONE HCL 10 MG PO TABS: 10.0000 mg | ORAL_TABLET | Freq: Three times a day (TID) | ORAL | 0 refills | Status: DC

## 2017-02-04 MED ORDER — FLUTICASONE FUROATE-VILANTEROL 200-25 MCG/INH IN AEPB: 1.0000 | INHALATION_SPRAY | Freq: Every day | RESPIRATORY_TRACT | Status: DC

## 2017-02-04 MED ORDER — FLUTICASONE PROPIONATE 50 MCG/ACT NA SUSP: 1.0000 | Freq: Every day | NASAL | Status: DC | PRN

## 2017-02-04 NOTE — Patient Instructions (Signed)
Let me check on the oxygen concentrator and extra help at home.  Go to the lab on the way out.  We'll contact you with your lab report. Don't change your meds for now. Take care.  Glad to see you.

## 2017-02-04 NOTE — Progress Notes (Signed)
Admit date: 01/23/2017 Discharge date: 01/25/2017  Admitted From: Home Disposition:  Home  Recommendations for Outpatient Follow-up:  1. Follow up with PCP in 1-2 weeks 2. Please obtain BMP/CBC in one week  Home Health: yes Equipment/Devices:oxygen with concentrator.   Discharge Condition: stable.  CODE STATUS: full code.  Diet recommendation: Heart Healthy   Brief/Interim Summary: [name] a 83 y.o.femalewith medical history significant of HTN, COPD, oxygen dependent on 3 L, fibromyalgia, chronic pain, and hypothyroidism was brought in for syncopal episode, was found to be hypotensive and has UTI.   Discharge Diagnoses:  Principal Problem:   Syncope and collapse Active Problems:   Anxiety state   Chronic pain syndrome   Fibromyalgia   Gold C Copd with asthmatic bronchitis component and chronic resp failure   Acute lower UTI   AKI (acute kidney injury) ([place])  Syncope secondary to orthostatic hypotension > Started him on iv fluids and hydrated .  Repeat orthostatics negative.  Overnight tele unremarkable.   Uti:  Cultures grew multiple bacteria.  Discharged her on oral keflex.   Copd;  Baseline oxygen requirement is 3 lit /min.  No wheezing heard.   Chronic pain syndrome:  Pain control.   AKI: Probably from dehydration and orthostatic hypotension.  Repeat level are back to baseline.   Hypokalemia: resolved.   =============================================== Hospital f/u.  Chronic pain.  She wanted to try to taper down on oxycodone, had been using less.  D/w pt.  rx printed.   Was admitted with UTI.  She initially had syncope at home. This was witnessed by family. EMS was called. She was admitted and treated with antibiotics. She did not recall the syncopal event itself but does recall details of the hospitalization. Hospital course discussed with patient.  No pain with urination now.  No fevers.   She likely had relative hypotension, discussed  with patient.  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    Chronic respiratory failure on O2. She was asking about getting an oxygen concentrator for use.  She uses [facility].    PMH and SH reviewed  ROS: Per HPI unless specifically indicated in ROS section   Meds, vitals, and allergies reviewed.   GEN: nad, alert and oriented, on O2  HEENT: mucous membranes moist NECK: supple w/o LA CV: rrr. PULM: ctab, no inc wob ABD: soft, +bs EXT: no edema SKIN: no acute rash

## 2017-02-05 ENCOUNTER — Telehealth: Payer: Self-pay | Admitting: *Deleted

## 2017-02-05 ENCOUNTER — Telehealth: Payer: Self-pay | Admitting: Family Medicine

## 2017-02-05 ENCOUNTER — Encounter: Payer: Self-pay | Admitting: Family Medicine

## 2017-02-05 ENCOUNTER — Encounter: Payer: Self-pay | Admitting: *Deleted

## 2017-02-05 NOTE — Assessment & Plan Note (Signed)
She was asking about getting an oxygen concentrator for use.  She uses [facility].   We'll check on this.

## 2017-02-05 NOTE — Telephone Encounter (Signed)
She was asking about getting an oxygen concentrator for use.  She uses [facility].   Please send an order to [facility] and see if they can set her up with this. Thanks. Dx. [phone].

## 2017-02-05 NOTE — Assessment & Plan Note (Signed)
See above

## 2017-02-05 NOTE — Telephone Encounter (Signed)
We normally run this through DME supplier.  If [facility] can't get the equipment, then we may need his help.  I wouldn't try both at the same time, I would await input from [facility].  Thanks.

## 2017-02-05 NOTE — Telephone Encounter (Signed)
Arna Snipe representative with [employer] left a voicemail stating that he is working with patient to try and get her a portable oxygen concentrator. Joe stated that he works for [employer] and thinks that he can help the patient. Mr. Patrice Paradise stated that the patient told him that she saw you yesterday and he wants to know if he can help her get what you want her to have?

## 2017-02-05 NOTE — Telephone Encounter (Signed)
Order for home oxygen concentrator faxed to [place].

## 2017-02-05 NOTE — Assessment & Plan Note (Signed)
Likely related to UTI with relative hypotension. Treated with antibiotics and improved in the meantime. Rationale for treatment discussed with patient. Recheck routine labs today. Okay for outpatient follow-up. She agrees.

## 2017-02-05 NOTE — Assessment & Plan Note (Addendum)
She is trying to taper her pain medicine as tolerated, will decrease down to #90 per month, 10 mg 3 times a day.  Prescription done and given to patient.

## 2017-02-06 NOTE — Telephone Encounter (Addendum)
Please have Arna Snipe send [place] information/order form to get her a 3L continuous O2 concentrator.  Thanks.

## 2017-02-06 NOTE — Telephone Encounter (Signed)
Left detailed message on voicemail for [name] per Dr. Josefine Class instructions.  Called and advised patient also. Was advised by patient that [facility] does not have the type of equipment that [employer] has that she wants. Patient stated that she has to fill her tank up all the time. Patient stated that the unit that she wants only requires that she plug it in.  Patient stated that because of what she has now she has plugs running all thru her house.

## 2017-02-07 NOTE — Telephone Encounter (Signed)
Left detailed message on voicemail of Patsy Lager,  asking for faxed info/order form

## 2017-02-07 NOTE — Telephone Encounter (Signed)
Form done. Thanks. 

## 2017-02-07 NOTE — Telephone Encounter (Signed)
Spoke with patient who says her insurance won't cover [employer] and [facility] does not have what she is wanting.  She will call back if she locates a company that can provide her with what she is requesting.

## 2017-02-07 NOTE — Telephone Encounter (Signed)
Form received from [place] for the oxygen.  Form placed in Dr. Josefine Class In [place].

## 2017-02-07 NOTE — Telephone Encounter (Signed)
Pt is requesting call back. She just found out that [employer] does not carry the type of oxygen machine that she is needing. She does not know which company will accept her insurance and carry the item she needs.

## 2017-02-07 NOTE — Telephone Encounter (Signed)
Faxed to [employer]. Sent for scanning.

## 2017-02-08 ENCOUNTER — Telehealth: Payer: Self-pay | Admitting: Pulmonary Disease

## 2017-02-08 NOTE — Telephone Encounter (Signed)
Called and spoke with pt and she is aware that she will need appt with PM to review her need for oxygen before any machine can be ordered.  She was last seen 3/17.    I have given her the name of a few of the DME companies and she will call to see if they can get this set up for her.  Nothing further is needed.

## 2017-02-18 ENCOUNTER — Telehealth: Payer: Self-pay | Admitting: Family Medicine

## 2017-02-18 NOTE — Telephone Encounter (Signed)
Patient states she has found a company that will provide her portable oxygen needs.  Form for ordering is in Dr. Josefine Class In Box along with last OV and O2 levels as requested.

## 2017-02-18 NOTE — Telephone Encounter (Signed)
Caller Name:Bunny Alcario Drought  Relationship to Patient:self  Best [phone] Pharmacy:  Reason for call: asking for call back about portable oxygen

## 2017-02-22 NOTE — Telephone Encounter (Signed)
Pt left a message on triage asking to speak to [place] about the Oxygen with [facility]. She did not elaborate.

## 2017-02-22 NOTE — Telephone Encounter (Signed)
Spoke with patient and appt scheduled to come in and check O2 sats for ppw to [place].

## 2017-02-26 ENCOUNTER — Encounter: Payer: Self-pay | Admitting: Family Medicine

## 2017-02-26 ENCOUNTER — Ambulatory Visit (INDEPENDENT_AMBULATORY_CARE_PROVIDER_SITE_OTHER): Payer: Medicare Other | Admitting: Family Medicine

## 2017-02-26 DIAGNOSIS — J9611 Chronic respiratory failure with hypoxia: Secondary | ICD-10-CM | POA: Diagnosis not present

## 2017-02-26 DIAGNOSIS — M545 Low back pain: Secondary | ICD-10-CM | POA: Diagnosis not present

## 2017-02-26 MED ORDER — OXYCODONE HCL 10 MG PO TABS: 10.0000 mg | ORAL_TABLET | Freq: Three times a day (TID) | ORAL | 0 refills | Status: DC

## 2017-02-26 NOTE — Progress Notes (Signed)
Patient Saturations on Room Air at Rest:  88% - 02/26/2017 Patient Saturations on Room Air while ambulating:  87% Patient Saturations on 3 Liters of oxygen while ambulating:  94%  CRF/O2 dependence.  SOB if off O2.  No fevers.  No vomiting, no diarrhea.   Still with some occ wheeze.  Some occ sputum, scant amount.  Still using oxygen via nasal cannula at baseline. See above.  rx done for pain meds, with fill on/after 03/06/17.  She has used w/o ADE at [place].    Meds, vitals, and allergies reviewed.   ROS: Per HPI unless specifically indicated in ROS section   GEN: nad, alert and oriented, elderly, chronically ill appearing lady in no apparent distress using O2 via nasal cannula HEENT: mucous membranes moist NECK: supple w/o LA CV: rrr.  PULM:  Global decrease in breath sounds but otherwise ctab, no inc wob ABD: soft, +bs EXT: no edema

## 2017-02-26 NOTE — Patient Instructions (Signed)
Don't change your meds for now.  I'll work on your forms.  Take care.  Glad to see you.

## 2017-02-27 NOTE — Assessment & Plan Note (Signed)
See above . Needs to continue O2 via nasal cannula with 3 L. No change in medications otherwise. Update me as needed. She agrees.  Patient Saturations on Room Air at Rest:  88% - 02/26/2017 Patient Saturations on Room Air while ambulating:  87% Patient Saturations on 3 Liters of oxygen while ambulating:  94%

## 2017-04-03 ENCOUNTER — Other Ambulatory Visit: Payer: Self-pay

## 2017-04-03 DIAGNOSIS — M545 Low back pain: Secondary | ICD-10-CM

## 2017-04-03 MED ORDER — OXYCODONE HCL 10 MG PO TABS: 10.0000 mg | ORAL_TABLET | Freq: Three times a day (TID) | ORAL | 0 refills | Status: DC

## 2017-04-03 NOTE — Telephone Encounter (Signed)
Spoke with pt notifying her rx is ready to pick up. Placed rx at front office.

## 2017-04-03 NOTE — Telephone Encounter (Signed)
Printed and in CMA box 

## 2017-04-03 NOTE — Telephone Encounter (Signed)
Pt left v/m requesting rx for oxycodone. Call when ready for pick up. Pt last seen and rx last printed # 90 on 02/26/17; not sure what day Dr Damita Dunnings will return.Please advise.

## 2017-04-05 ENCOUNTER — Emergency Department (HOSPITAL_COMMUNITY): Payer: Medicare Other

## 2017-04-05 ENCOUNTER — Inpatient Hospital Stay (HOSPITAL_COMMUNITY)
Admission: EM | Admit: 2017-04-05 | Discharge: 2017-04-07 | DRG: 389 | Disposition: A | Discharge status: Home / Self Care | Payer: Medicare Other | Attending: Nephrology | Admitting: Nephrology

## 2017-04-05 ENCOUNTER — Encounter (HOSPITAL_COMMUNITY): Payer: Self-pay | Admitting: Emergency Medicine

## 2017-04-05 DIAGNOSIS — I714 Abdominal aortic aneurysm, without rupture: Secondary | ICD-10-CM | POA: Diagnosis present

## 2017-04-05 DIAGNOSIS — Z7951 Long term (current) use of inhaled steroids: Secondary | ICD-10-CM | POA: Diagnosis not present

## 2017-04-05 DIAGNOSIS — Z825 Family history of asthma and other chronic lower respiratory diseases: Secondary | ICD-10-CM

## 2017-04-05 DIAGNOSIS — I712 Thoracic aortic aneurysm, without rupture: Secondary | ICD-10-CM | POA: Diagnosis present

## 2017-04-05 DIAGNOSIS — J9611 Chronic respiratory failure with hypoxia: Secondary | ICD-10-CM | POA: Diagnosis present

## 2017-04-05 DIAGNOSIS — M810 Age-related osteoporosis without current pathological fracture: Secondary | ICD-10-CM | POA: Diagnosis present

## 2017-04-05 DIAGNOSIS — Z79899 Other long term (current) drug therapy: Secondary | ICD-10-CM | POA: Diagnosis not present

## 2017-04-05 DIAGNOSIS — G894 Chronic pain syndrome: Secondary | ICD-10-CM | POA: Diagnosis present

## 2017-04-05 DIAGNOSIS — Z87891 Personal history of nicotine dependence: Secondary | ICD-10-CM | POA: Diagnosis not present

## 2017-04-05 DIAGNOSIS — R109 Unspecified abdominal pain: Secondary | ICD-10-CM

## 2017-04-05 DIAGNOSIS — Z96651 Presence of right artificial knee joint: Secondary | ICD-10-CM | POA: Diagnosis present

## 2017-04-05 DIAGNOSIS — Z888 Allergy status to other drugs, medicaments and biological substances status: Secondary | ICD-10-CM

## 2017-04-05 DIAGNOSIS — E876 Hypokalemia: Secondary | ICD-10-CM | POA: Diagnosis present

## 2017-04-05 DIAGNOSIS — Z96611 Presence of right artificial shoulder joint: Secondary | ICD-10-CM | POA: Diagnosis present

## 2017-04-05 DIAGNOSIS — K56600 Partial intestinal obstruction, unspecified as to cause: Secondary | ICD-10-CM | POA: Diagnosis present

## 2017-04-05 DIAGNOSIS — J449 Chronic obstructive pulmonary disease, unspecified: Secondary | ICD-10-CM | POA: Diagnosis present

## 2017-04-05 DIAGNOSIS — I129 Hypertensive chronic kidney disease with stage 1 through stage 4 chronic kidney disease, or unspecified chronic kidney disease: Secondary | ICD-10-CM | POA: Diagnosis present

## 2017-04-05 DIAGNOSIS — Z882 Allergy status to sulfonamides status: Secondary | ICD-10-CM | POA: Diagnosis not present

## 2017-04-05 DIAGNOSIS — I10 Essential (primary) hypertension: Secondary | ICD-10-CM | POA: Diagnosis present

## 2017-04-05 DIAGNOSIS — F419 Anxiety disorder, unspecified: Secondary | ICD-10-CM | POA: Diagnosis present

## 2017-04-05 DIAGNOSIS — N189 Chronic kidney disease, unspecified: Secondary | ICD-10-CM | POA: Diagnosis present

## 2017-04-05 DIAGNOSIS — K219 Gastro-esophageal reflux disease without esophagitis: Secondary | ICD-10-CM | POA: Diagnosis present

## 2017-04-05 DIAGNOSIS — Z9981 Dependence on supplemental oxygen: Secondary | ICD-10-CM | POA: Diagnosis not present

## 2017-04-05 DIAGNOSIS — Z09 Encounter for follow-up examination after completed treatment for conditions other than malignant neoplasm: Secondary | ICD-10-CM

## 2017-04-05 DIAGNOSIS — K56609 Unspecified intestinal obstruction, unspecified as to partial versus complete obstruction: Secondary | ICD-10-CM | POA: Diagnosis present

## 2017-04-05 DIAGNOSIS — Z66 Do not resuscitate: Secondary | ICD-10-CM | POA: Diagnosis present

## 2017-04-05 DIAGNOSIS — Z801 Family history of malignant neoplasm of trachea, bronchus and lung: Secondary | ICD-10-CM

## 2017-04-05 DIAGNOSIS — E039 Hypothyroidism, unspecified: Secondary | ICD-10-CM | POA: Diagnosis present

## 2017-04-05 DIAGNOSIS — D259 Leiomyoma of uterus, unspecified: Secondary | ICD-10-CM | POA: Diagnosis present

## 2017-04-05 DIAGNOSIS — Z8249 Family history of ischemic heart disease and other diseases of the circulatory system: Secondary | ICD-10-CM | POA: Diagnosis not present

## 2017-04-05 DIAGNOSIS — Z8673 Personal history of transient ischemic attack (TIA), and cerebral infarction without residual deficits: Secondary | ICD-10-CM

## 2017-04-05 DIAGNOSIS — Z9889 Other specified postprocedural states: Secondary | ICD-10-CM

## 2017-04-05 DIAGNOSIS — M797 Fibromyalgia: Secondary | ICD-10-CM | POA: Diagnosis not present

## 2017-04-05 DIAGNOSIS — J9610 Chronic respiratory failure, unspecified whether with hypoxia or hypercapnia: Secondary | ICD-10-CM | POA: Diagnosis present

## 2017-04-05 HISTORY — DX: Unspecified abdominal pain: R10.9

## 2017-04-05 LAB — MAGNESIUM: Magnesium: 1.8 mg/dL (ref 1.7–2.4)

## 2017-04-05 LAB — COMPREHENSIVE METABOLIC PANEL
ALT: 13 U/L — ABNORMAL LOW (ref 14–54)
AST: 36 U/L (ref 15–41)
Albumin: 3.7 g/dL (ref 3.5–5.0)
Alkaline Phosphatase: 48 U/L (ref 38–126)
Anion gap: 9 (ref 5–15)
BUN: 12 mg/dL (ref 6–20)
CO2: 29 mmol/L (ref 22–32)
Calcium: 8.9 mg/dL (ref 8.9–10.3)
Chloride: 101 mmol/L (ref 101–111)
Creatinine, Ser: 0.68 mg/dL (ref 0.44–1.00)
GFR calc Af Amer: >60 mL/min (ref >60)
GFR calc non Af Amer: >60 mL/min (ref >60)
Glucose, Bld: 125 mg/dL — ABNORMAL HIGH (ref 65–99)
Potassium: 3.5 mmol/L (ref 3.5–5.1)
Sodium: 139 mmol/L (ref 135–145)
Total Bilirubin: 1 mg/dL (ref 0.3–1.2)
Total Protein: 7.3 g/dL (ref 6.5–8.1)

## 2017-04-05 LAB — CBC WITH DIFFERENTIAL/PLATELET
Basophils Absolute: 0 10*3/uL (ref 0.0–0.1)
Basophils Relative: 0 %
Eosinophils Absolute: 0 10*3/uL (ref 0.0–0.7)
Eosinophils Relative: 0 %
HCT: 33.2 % — ABNORMAL LOW (ref 36.0–46.0)
Hemoglobin: 10.7 g/dL — ABNORMAL LOW (ref 12.0–15.0)
Lymphocytes Relative: 7 %
Lymphs Abs: 0.6 10*3/uL — ABNORMAL LOW (ref 0.7–4.0)
MCH: 30.7 pg (ref 26.0–34.0)
MCHC: 32.2 g/dL (ref 30.0–36.0)
MCV: 95.1 fL (ref 78.0–100.0)
Monocytes Absolute: 0.3 10*3/uL (ref 0.1–1.0)
Monocytes Relative: 4 %
Neutro Abs: 7.6 10*3/uL (ref 1.7–7.7)
Neutrophils Relative %: 89 %
Platelets: 211 10*3/uL (ref 150–400)
RBC: 3.49 MIL/uL — ABNORMAL LOW (ref 3.87–5.11)
RDW: 13.9 % (ref 11.5–15.5)
WBC: 8.6 10*3/uL (ref 4.0–10.5)

## 2017-04-05 LAB — CBC
HCT: 34.3 % — ABNORMAL LOW (ref 36.0–46.0)
Hemoglobin: 10.7 g/dL — ABNORMAL LOW (ref 12.0–15.0)
MCH: 29.6 pg (ref 26.0–34.0)
MCHC: 31.2 g/dL (ref 30.0–36.0)
MCV: 94.8 fL (ref 78.0–100.0)
Platelets: 226 10*3/uL (ref 150–400)
RBC: 3.62 MIL/uL — ABNORMAL LOW (ref 3.87–5.11)
RDW: 13.6 % (ref 11.5–15.5)
WBC: 9.7 10*3/uL (ref 4.0–10.5)

## 2017-04-05 LAB — CREATININE, SERUM
Creatinine, Ser: 0.71 mg/dL (ref 0.44–1.00)
GFR calc Af Amer: >60 mL/min (ref >60)
GFR calc non Af Amer: >60 mL/min (ref >60)

## 2017-04-05 LAB — I-STAT TROPONIN, ED: Troponin i, poc: 0 ng/mL (ref 0.00–0.08)

## 2017-04-05 LAB — TROPONIN I: Troponin I: <0.03 ng/mL (ref <0.03)

## 2017-04-05 LAB — PROTIME-INR
INR: 1.1
Prothrombin Time: 14.1 seconds (ref 11.4–15.2)

## 2017-04-05 LAB — BRAIN NATRIURETIC PEPTIDE: B Natriuretic Peptide: 103 pg/mL — ABNORMAL HIGH (ref 0.0–100.0)

## 2017-04-05 MED ORDER — DULOXETINE HCL 60 MG PO CPEP
60.0000 mg | ORAL_CAPSULE | Freq: Every day | ORAL | Status: DC
  Administered 2017-04-06 – 2017-04-07 (×2): 60 mg via ORAL
  Filled 2017-04-05 (×2): qty 1

## 2017-04-05 MED ORDER — ASPIRIN 81 MG PO CHEW
324.0000 mg | CHEWABLE_TABLET | Freq: Once | ORAL | Status: AC
  Administered 2017-04-05: 324 mg via ORAL
  Filled 2017-04-05: qty 4

## 2017-04-05 MED ORDER — FLUTICASONE FUROATE-VILANTEROL 200-25 MCG/INH IN AEPB
1.0000 | INHALATION_SPRAY | Freq: Every day | RESPIRATORY_TRACT | Status: DC | PRN
  Filled 2017-04-05: qty 28

## 2017-04-05 MED ORDER — IOPAMIDOL (ISOVUE-370) INJECTION 76%
INTRAVENOUS | Status: AC
  Administered 2017-04-05: 100 mL
  Filled 2017-04-05: qty 100

## 2017-04-05 MED ORDER — ENOXAPARIN SODIUM 40 MG/0.4ML ~~LOC~~ SOLN
40.0000 mg | SUBCUTANEOUS | Status: DC
  Administered 2017-04-05 – 2017-04-06 (×2): 40 mg via SUBCUTANEOUS
  Filled 2017-04-05 (×2): qty 0.4

## 2017-04-05 MED ORDER — AMLODIPINE BESYLATE 2.5 MG PO TABS
2.5000 mg | ORAL_TABLET | Freq: Every day | ORAL | Status: DC
  Administered 2017-04-06 – 2017-04-07 (×2): 2.5 mg via ORAL
  Filled 2017-04-05 (×2): qty 1

## 2017-04-05 MED ORDER — ALBUTEROL SULFATE (2.5 MG/3ML) 0.083% IN NEBU: 2.5000 mg | INHALATION_SOLUTION | Freq: Four times a day (QID) | RESPIRATORY_TRACT | Status: DC | PRN

## 2017-04-05 MED ORDER — LEVOTHYROXINE SODIUM 112 MCG PO TABS
112.0000 ug | ORAL_TABLET | Freq: Every day | ORAL | Status: DC
  Administered 2017-04-06 – 2017-04-07 (×2): 112 ug via ORAL
  Filled 2017-04-05 (×2): qty 1

## 2017-04-05 MED ORDER — TIOTROPIUM BROMIDE MONOHYDRATE 18 MCG IN CAPS
18.0000 ug | ORAL_CAPSULE | Freq: Every day | RESPIRATORY_TRACT | Status: DC
  Administered 2017-04-05 – 2017-04-07 (×2): 18 ug via RESPIRATORY_TRACT
  Filled 2017-04-05: qty 5

## 2017-04-05 MED ORDER — ONDANSETRON HCL 4 MG PO TABS: 4.0000 mg | ORAL_TABLET | Freq: Four times a day (QID) | ORAL | Status: DC | PRN

## 2017-04-05 MED ORDER — SODIUM CHLORIDE 0.9 % IV SOLN
INTRAVENOUS | Status: DC
  Administered 2017-04-05 – 2017-04-06 (×2): via INTRAVENOUS

## 2017-04-05 MED ORDER — SODIUM CHLORIDE 0.9 % IV SOLN
INTRAVENOUS | Status: DC
  Administered 2017-04-05 (×2): via INTRAVENOUS

## 2017-04-05 MED ORDER — TRAZODONE HCL 100 MG PO TABS
100.0000 mg | ORAL_TABLET | Freq: Every day | ORAL | Status: DC
  Administered 2017-04-05 – 2017-04-06 (×2): 100 mg via ORAL
  Filled 2017-04-05 (×2): qty 1

## 2017-04-05 MED ORDER — ONDANSETRON HCL 4 MG/2ML IJ SOLN
4.0000 mg | Freq: Four times a day (QID) | INTRAMUSCULAR | Status: DC | PRN
Start: 2017-04-05 — End: 2017-04-07

## 2017-04-05 MED ORDER — HYDROMORPHONE HCL 1 MG/ML IJ SOLN
0.5000 mg | INTRAMUSCULAR | Status: DC | PRN
  Administered 2017-04-06: 0.5 mg via INTRAVENOUS
  Filled 2017-04-05: qty 1

## 2017-04-05 NOTE — ED Triage Notes (Signed)
Patient presents to the ED from home reports she been vomiting x2 days. Patient reports she has epigastric pain radiating up to her throat. Patient reports she was given 4mg  of Zofran and nausea and pain is now gone. Per EMS patient was orthostatic BP at sitting  152 standing 112. Patient given 500 fluids . Patient was not give ASA or nitro. Patient alert and oriented on arrival. MD speaking with patient at this time.

## 2017-04-05 NOTE — H&P (Signed)
abd pain, poss SBO  Maybe atypical CP Hx aortic disease  2-3 d n/v, epig pain,  CT shows obstruction.  T/w surg , conservatgive for now    [facility] History and Physical  [name] JJO:[id number] DOB: [birth year] DOA: 04/05/2017  Referring physician: Dr Vanita Panda PCP: Tonia Ghent, MD   Chief Complaint: Abdominal Pain  HPI: [name] is a 83 y.o. female with hx of anxiety, asthma, CKD, COPD, OA, FM/ chronic pain, CVA and HTN presented to ED with  Nausea/ vomiting for 2-3 days.  CT abd/ chest shows some bowel distension, prob partial SBO.  Seen by gen surgery, recommend admit, NPO, IVF's and use NG tube if pt has recur vomiting.    Patient is on 2-3 L [place] oxygen at home, for years, for COPD.  She request DNR , does not want heroic measures in event of arrest/ decline.    She reports 2-3 day hx of N/V , spontaneous not assoc with eating, dark brown liquids, mid - abd pain and central CP after vomiting.  No fevers, +BM, last was last night.  No diarrhea.  Hx of umb hernia repair with MESH in the past.  Son-in-law is at bedside and confirms DNR request.  She also says she "can't have surgery", the anesthesia is "too hard on her".  Son in law concurs.   ROS  no joint pain   no HA  no blurry vision  no rash  no diarrhea  no dysuria  no difficulty voiding  no change in urine color    Past Medical History  Past Medical History:  Diagnosis Date  . Allergic rhinitis, cause unspecified   . Allergy, unspecified not elsewhere classified   . Anxiety   . Asthma   . Chronic kidney disease    frequency  . COPD (chronic obstructive pulmonary disease) ([place])   . Degeneration of intervertebral disc, site unspecified   . Diverticulosis of colon (without mention of hemorrhage)   . Esophageal reflux   . Headache   . Insomnia, unspecified   . Myalgia and myositis, unspecified   . Need for prophylactic hormone replacement therapy (postmenopausal)   . Osteoarthritis of right  knee 08/27/2011   R knee pain, s/p arthroscopy 2012 per [name]   . Osteoporosis, unspecified   . Other abnormal blood chemistry   . Other and unspecified hyperlipidemia   . Other chronic pain   . Stroke [facility])    mini strokes  . Unspecified essential hypertension   . Unspecified hypothyroidism    Past Surgical History  Past Surgical History:  Procedure Laterality Date  . BACK SURGERY    . breast cystectomy    . JOINT REPLACEMENT     shoulder right  . KNEE ARTHROSCOPY     Right knee 2012  . MASS EXCISION Left 04/07/2015   Procedure: MINOR EXCISION OF MASS LEFT SMALL FINGER;  Surgeon: Leanora Cover, MD;  Location: [place];  Service: Orthopedics;  Laterality: Left;  . SHOULDER SURGERY    . TOTAL KNEE ARTHROPLASTY  12/11/2011   Procedure: TOTAL KNEE ARTHROPLASTY;  Surgeon: Johnny Bridge, MD;  Location: [place];  Service: Orthopedics;  Laterality: Right;  . UMBILICAL HERNIA REPAIR N/A 06/09/2013   Procedure: EXPLORATIORY LAPAROTOMY, HERNIA REPAIR UMBILICAL, INSERTION OF MESH;  Surgeon: Haywood Lasso, MD;  Location: [facility];  Service: General;  Laterality: N/A;   Family History  Family History  Problem Relation Age of Onset  . Lung  cancer Brother        x2  . Hypertension Father   . Heart disease Father   . Heart disease Mother   . Asthma Sister    Social History  reports that she quit smoking about 31 years ago. Her smoking use included Cigarettes. She has a 15.00 pack-year smoking history. She has never used smokeless tobacco. She reports that she does not drink alcohol or use drugs. Allergies  Allergies  Allergen Reactions  . Doxycycline     GI upset  . Sulfadiazine     REACTION: Fever, achey  . Sulfa Antibiotics Rash   Home medications Prior to Admission medications   Medication Sig Start Date End Date Taking? Authorizing Provider  albuterol (PROVENTIL HFA;VENTOLIN HFA) 108 (90 Base) MCG/ACT inhaler Inhale 1-2 puffs into the lungs every 6 (six)  hours as needed for wheezing or shortness of breath. 07/24/16  Yes Tonia Ghent, MD  ALPRAZolam Duanne Moron) 1 MG tablet TAKE 1/2 TABLET BY MOUTH TWICE A DAY AS NEEDED AS DIRECTED. 11/23/16  Yes Tonia Ghent, MD  amLODipine (NORVASC) 2.5 MG tablet Take 1 tablet (2.5 mg total) by mouth daily. 07/24/16  Yes Tonia Ghent, MD  Calcium Carbonate (CALTRATE 600 PO) Take 1 tablet by mouth daily.     Yes [provider]  cholecalciferol (VITAMIN D) 1000 units tablet Take 1 tablet (1,000 Units total) by mouth daily. 09/25/16  Yes Tonia Ghent, MD  diclofenac sodium (VOLTAREN) 1 % GEL Apply 4 g topically 2 (two) times daily as needed (pain). 02/04/17  Yes Tonia Ghent, MD  DULoxetine (CYMBALTA) 60 MG capsule Take 1 capsule (60 mg total) by mouth daily. 07/24/16  Yes Tonia Ghent, MD  fluticasone furoate-vilanterol (BREO ELLIPTA) 200-25 MCG/INH AEPB Inhale 1 puff into the lungs daily. Patient taking differently: Inhale 1 puff into the lungs daily as needed (for breathing).  02/04/17  Yes Tonia Ghent, MD  furosemide (LASIX) 20 MG tablet TAKE 1/2 TO 1 TABLET BY MOUTH EVERY MORNING AS NEEDED FOR SWELLING. USE SPARINGLY.  Hold it for one week from now. 02/01/17  Yes Hosie Poisson, MD  gabapentin (NEURONTIN) 300 MG capsule Take 1 capsule (300 mg total) by mouth 3 (three) times daily. 02/04/17  Yes Tonia Ghent, MD  ibuprofen (ADVIL,MOTRIN) 200 MG tablet Take 600 mg by mouth daily as needed for mild pain.   Yes [provider]  levothyroxine (SYNTHROID, LEVOTHROID) 112 MCG tablet TAKE 1 TABLET BY MOUTH DAILY BEFORE BREAKFAST. 07/24/16  Yes Tonia Ghent, MD  Multiple Vitamin (MULTIVITAMIN) capsule Take 1 capsule by mouth daily.     Yes [provider]  NON FORMULARY Place 2.5-3 L into the nose daily. OXYGEN   Yes [provider]  Oxycodone HCl 10 MG TABS Take 1 tablet (10 mg total) by mouth 3 (three) times daily. Fill on/after 03/06/2017 Patient taking  differently: Take 10 mg by mouth 2 (two) times daily. Fill on/after 03/06/2017 04/03/17  Yes Ria Bush, MD  potassium chloride (K-DUR) 10 MEQ tablet Take 1 tablet (10 mEq total) by mouth daily as needed (If taking lasix/furosemide). 02/04/17  Yes Tonia Ghent, MD  ranitidine (ZANTAC) 150 MG tablet Take 1 tablet (150 mg total) by mouth daily. 02/04/17  Yes Tonia Ghent, MD  tiotropium (SPIRIVA HANDIHALER) 18 MCG inhalation capsule PLACE 1 CAPSULE INTO INHALER AND INHALE ONCE A DAY 07/24/16  Yes Tonia Ghent, MD  traZODone (DESYREL) 50 MG tablet TAKE  2 TABLETS (100 MG TOTAL) BY MOUTH AT BEDTIME 02/04/17  Yes Tonia Ghent, MD  fluticasone [facility]) 50 MCG/ACT nasal spray Place 1-2 sprays into both nostrils daily as needed for allergies. 02/04/17   Tonia Ghent, MD  montelukast (SINGULAIR) 10 MG tablet Take 1 tablet (10 mg total) by mouth daily as needed (allergies). 02/04/17   Tonia Ghent, MD   Liver Function Tests  Recent Labs Lab 04/05/17 0923  AST 36  ALT 13*  ALKPHOS 48  BILITOT 1.0  PROT 7.3  ALBUMIN 3.7   No results for input(s): LIPASE, AMYLASE in the last 168 hours. CBC  Recent Labs Lab 04/05/17 0923  WBC 8.6  NEUTROABS 7.6  HGB 10.7*  HCT 33.2*  MCV 95.1  PLT 824   Basic Metabolic Panel  Recent Labs Lab 04/05/17 0923  NA 139  K 3.5  CL 101  CO2 29  GLUCOSE 125*  BUN 12  CREATININE 0.68  CALCIUM 8.9     Vitals:   04/05/17 0817 04/05/17 1130 04/05/17 1300 04/05/17 1345  BP: (!) 166/77 134/79 (!) 148/79   Pulse: 79 76 75 76  Resp: 18 20 16 16   Temp: 98.4 F (36.9 C)     TempSrc: Oral     SpO2: 100% 100% 99% 100%   Exam: Gen eldelry pleasant WF, no distress No rash, cyanosis or gangrene Sclera anicteric, throat clear  No jvd or bruits Chest dec'd BS bilat, no rales or wheezing RRR no MRG  Abd soft ntnd no mass or ascites +bs GU defer MS no joint effusions or deformity Ext no LE edema / no  wounds or ulcers Neuro is  alert, Ox 3 , nf     Home meds: -norvasc 2.5 qd/ lasix 10-20 mg prn qd -montelukast/ flonase/ spiriva/ Breo Ellipta/ alb nebs prn -xanax prn bid/ cymbalta 60 qd/ neurontin 300 tid/ ibuprofen 600 prn/ oxycodone prn/ trazodone 100 qhs -zantac/ Kdur/ MVI/ T4/ vit D/ CaCarb  Na 139 k 3.5 Cr 0.68  Glu 125  CO2 29   Alb 3.7  LFT's ok  BNP 102  Trop < 0.03   WBC 8k Hb 10.7  plt 211  EKG (independ reviewed) >  Normal sinus rhythm T wave abnormality Abnormal ekg  CXR (independ reviewed) >  1. Aneurysmal dilatation of the ascending thoracic aorta noted. No active disease. 2. CT chest/ abd / pelvis > 4.2 cm ascending thoracic aortic aneurysm. 3.4 cm infrarenal abdominal aortic aneurysm. No evidence of dissection. Recommend annual imaging followup by CTA or MRA. This recommendation follows 2010 ACCF/AHA/AATS/ACR/ASA/SCA/SCAI/SIR/STS/SVM Guidelines for the Diagnosis and Management of Patients with Thoracic Aortic Disease. Circulation. 2010; 121: M353-I144 3. Markedly enlarged, heterogeneous uterus, similar to prior study, presumably fibroid uterus. 4. Dilated proximal and mid small bowel loops with decompressed distal small bowel loops. Findings concerning for partial small bowel obstruction. 5. Coronary artery disease.    Assessment: 1.  Partial SBO - w./ nausea/ vomiting and abd pain.  Conservative treatment, patient days she "can't have surgery".  No need NG yet but may if N/V recurs in hospital.  IVF's.  Pain meds prn.  2.  Chest pain - related to vomiting, doubt cardiac 3.  FM/ DJD/ chronic pain 4.  COPD home O2 dependent 5.   DNR 6.   HTN on norvasc 7.   Ascend thoracic aortic aneursym - not candidate for Rx  Plan - as above      Thomas D [facility] Pager [phone]  If 7PM-7AM, please contact night-coverage [email or web address] Password [email or web address] 04/05/2017, 2:51 PM

## 2017-04-05 NOTE — ED Provider Notes (Addendum)
[facility] DEPT Provider Note   CSN: [id number] Arrival date & time: 04/05/17  5573     History   Chief Complaint Chief Complaint  Patient presents with  . Chest Pain    HPI [name] is a 83 y.o. female.  HPI  Patient is a concern of 2 days of epigastric, sternal discomfort. Patient states that she was generally well prior to the onset of symptoms, since onset has been persistently uncomfortable, with sharp, tight sensation in her epigastrium, and chest. She has had persistent nausea, and multiple episodes of vomiting. She has baseline dyspnea, this is unchanged. She wears oxygen 24/7 due to COPD. The level has an not been changed. No fever. No confusion, no disorientation. No recent medication changes, diet changes, activity changes. Patient notes that her nausea improved in route after being provided Zofran by EMS providers per  I discussed patient's case with EMS on arrival.  They corroborate the patient's story.  Past Medical History:  Diagnosis Date  . Allergic rhinitis, cause unspecified   . Allergy, unspecified not elsewhere classified   . Anxiety   . Asthma   . Chronic kidney disease    frequency  . COPD (chronic obstructive pulmonary disease) ([place])   . Degeneration of intervertebral disc, site unspecified   . Diverticulosis of colon (without mention of hemorrhage)   . Esophageal reflux   . Headache   . Insomnia, unspecified   . Myalgia and myositis, unspecified   . Need for prophylactic hormone replacement therapy (postmenopausal)   . Osteoarthritis of right knee 08/27/2011   R knee pain, s/p arthroscopy 2012 per [name]   . Osteoporosis, unspecified   . Other abnormal blood chemistry   . Other and unspecified hyperlipidemia   . Other chronic pain   . Stroke [facility])    mini strokes  . Unspecified essential hypertension   . Unspecified hypothyroidism     Patient Active Problem List   Diagnosis Date Noted  . Syncope 01/24/2017  .  Acute lower UTI 01/24/2017  . AKI (acute kidney injury) ([place]) 01/24/2017  . Low back pain 01/01/2017  . Health care maintenance 09/26/2016  . Hard of hearing 09/26/2016  . Vitamin D deficiency 09/26/2016  . Advance care planning 09/26/2016  . Dysuria 09/26/2016  . Encounter for chronic pain management 07/26/2016  . Finger lesion 03/19/2015  . Edema 10/21/2014  . Headache 10/21/2014  . Multinodular thyroid 07/07/2013  . Abnormal CT scan 06/25/2013  . HTN (hypertension) 06/09/2013  . Chronic respiratory failure ([place]) 05/13/2013  . Cough 05/02/2013  . Other malaise and fatigue 12/31/2012  . Vertigo 12/31/2012  . Osteoarthritis of right knee 08/27/2011  . Gold C Copd with asthmatic bronchitis component and chronic resp failure 06/26/2011  . Neck pain 04/02/2011  . Onychomycosis 01/31/2011  . RISK OF FALLING 08/17/2010  . UNSPECIFIED URINARY INCONTINENCE 01/12/2010  . HYPOPOTASSEMIA 05/24/2009  . ANAL FISSURE 03/24/2009  . ANEMIA, MILD 04/10/2008  . URINARY TRACT INFECTION, ACUTE, RECURRENT 04/10/2008  . Anxiety state 03/01/2008  . Osteoporosis 06/23/2007  . Seasonal and perennial allergic rhinitis 04/15/2007  . Chronic pain syndrome 12/26/2006  . Hypothyroidism 12/24/2006  . HYPERLIPIDEMIA 12/24/2006  . COMMON MIGRAINE 12/24/2006  . GERD 12/24/2006  . DIVERTICULOSIS, COLON 12/24/2006  . DEGENERATIVE DISC DISEASE 12/24/2006  . Fibromyalgia 12/24/2006  . Insomnia 12/24/2006  . HYPERGLYCEMIA 12/24/2006    Past Surgical History:  Procedure Laterality Date  . BACK SURGERY    . breast cystectomy    .  JOINT REPLACEMENT     shoulder right  . KNEE ARTHROSCOPY     Right knee 2012  . MASS EXCISION Left 04/07/2015   Procedure: MINOR EXCISION OF MASS LEFT SMALL FINGER;  Surgeon: Leanora Cover, MD;  Location: [place];  Service: Orthopedics;  Laterality: Left;  . SHOULDER SURGERY    . TOTAL KNEE ARTHROPLASTY  12/11/2011   Procedure: TOTAL KNEE ARTHROPLASTY;  Surgeon:  Johnny Bridge, MD;  Location: [place];  Service: Orthopedics;  Laterality: Right;  . UMBILICAL HERNIA REPAIR N/A 06/09/2013   Procedure: EXPLORATIORY LAPAROTOMY, HERNIA REPAIR UMBILICAL, INSERTION OF MESH;  Surgeon: Haywood Lasso, MD;  Location: [place];  Service: General;  Laterality: N/A;    OB History    No data available       Home Medications    Prior to Admission medications   Medication Sig Start Date End Date Taking? Authorizing Provider  albuterol (PROVENTIL HFA;VENTOLIN HFA) 108 (90 Base) MCG/ACT inhaler Inhale 1-2 puffs into the lungs every 6 (six) hours as needed for wheezing or shortness of breath. 07/24/16   Tonia Ghent, MD  ALPRAZolam Duanne Moron) 1 MG tablet TAKE 1/2 TABLET BY MOUTH TWICE A DAY AS NEEDED AS DIRECTED. 11/23/16   Tonia Ghent, MD  amLODipine (NORVASC) 2.5 MG tablet Take 1 tablet (2.5 mg total) by mouth daily. 07/24/16   Tonia Ghent, MD  Calcium Carbonate (CALTRATE 600 PO) Take 1 tablet by mouth daily.      [provider]  cholecalciferol (VITAMIN D) 1000 units tablet Take 1 tablet (1,000 Units total) by mouth daily. 09/25/16   Tonia Ghent, MD  diclofenac sodium (VOLTAREN) 1 % GEL Apply 4 g topically 2 (two) times daily as needed (pain). 02/04/17   Tonia Ghent, MD  DULoxetine (CYMBALTA) 60 MG capsule Take 1 capsule (60 mg total) by mouth daily. 07/24/16   Tonia Ghent, MD  fluticasone Asencion Islam) 50 MCG/ACT nasal spray Place 1-2 sprays into both nostrils daily as needed for allergies. 02/04/17   Tonia Ghent, MD  fluticasone furoate-vilanterol (BREO ELLIPTA) 200-25 MCG/INH AEPB Inhale 1 puff into the lungs daily. 02/04/17   Tonia Ghent, MD  furosemide (LASIX) 20 MG tablet TAKE 1/2 TO 1 TABLET BY MOUTH EVERY MORNING AS NEEDED FOR SWELLING. USE SPARINGLY.  Hold it for one week from now. 02/01/17   Hosie Poisson, MD  gabapentin (NEURONTIN) 300 MG capsule Take 1 capsule (300 mg total) by mouth 3 (three) times daily. 02/04/17    Tonia Ghent, MD  levothyroxine (SYNTHROID, LEVOTHROID) 112 MCG tablet TAKE 1 TABLET BY MOUTH DAILY BEFORE BREAKFAST. 07/24/16   Tonia Ghent, MD  montelukast (SINGULAIR) 10 MG tablet Take 1 tablet (10 mg total) by mouth daily as needed (allergies). 02/04/17   Tonia Ghent, MD  Multiple Vitamin (MULTIVITAMIN) capsule Take 1 capsule by mouth daily.      [provider]  NON FORMULARY Oxygen Use 3 liters 24/7    [provider]  Oxycodone HCl 10 MG TABS Take 1 tablet (10 mg total) by mouth 3 (three) times daily. Fill on/after 03/06/2017 04/03/17   Ria Bush, MD  potassium chloride (K-DUR) 10 MEQ tablet Take 1 tablet (10 mEq total) by mouth daily as needed (If taking lasix/furosemide). 02/04/17   Tonia Ghent, MD  ranitidine (ZANTAC) 150 MG tablet Take 1 tablet (150 mg total) by mouth daily. 02/04/17   Tonia Ghent, MD  tiotropium [facility] Michaell Cowing)  18 MCG inhalation capsule PLACE 1 CAPSULE INTO INHALER AND INHALE ONCE A DAY 07/24/16   Tonia Ghent, MD  traZODone (DESYREL) 50 MG tablet TAKE 2 TABLETS (100 MG TOTAL) BY MOUTH AT BEDTIME 02/04/17   Tonia Ghent, MD    Family History Family History  Problem Relation Age of Onset  . Lung cancer Brother        x2  . Hypertension Father   . Heart disease Father   . Heart disease Mother   . Asthma Sister     Social History Social History  Substance Use Topics  . Smoking status: Former Smoker    Packs/day: 1.50    Years: 10.00    Types: Cigarettes    Quit date: 07/09/1985  . Smokeless tobacco: Never Used     Comment: 1 1/2 ppd x 20 years  . Alcohol use No     Allergies   Doxycycline and Sulfadiazine   Review of Systems Review of Systems  Constitutional:       Per HPI, otherwise negative  HENT:       Per HPI, otherwise negative  Respiratory:       Per HPI, otherwise negative  Cardiovascular:       Per HPI, otherwise negative  Gastrointestinal: Positive for abdominal pain, nausea and  vomiting.  Endocrine:       Negative aside from HPI  Genitourinary:       Neg aside from HPI   Musculoskeletal:       Per HPI, otherwise negative  Skin: Negative.   Neurological: Negative for syncope.     Physical Exam Updated Vital Signs BP (!) 166/77 (BP Location: Right Arm)   Pulse 79   Temp 98.4 F (36.9 C) (Oral)   Resp 18   SpO2 100%   Physical Exam  Constitutional: She is oriented to person, place, and time. She has a sickly appearance. No distress.  HENT:  Head: Normocephalic and atraumatic.  Eyes: Conjunctivae and EOM are normal.  Cardiovascular: Normal rate and regular rhythm.   Pulmonary/Chest: Effort normal. No stridor.  Diminished breath sounds bilateral  Abdominal: She exhibits no distension. There is no tenderness. There is no guarding.  Musculoskeletal: She exhibits no edema.  Neurological: She is alert and oriented to person, place, and time. No cranial nerve deficit.  Skin: Skin is warm and dry.  Psychiatric: She has a normal mood and affect.  Nursing note and vitals reviewed.    ED Treatments / Results  Labs (all labs ordered are listed, but only abnormal results are displayed) Labs Reviewed  COMPREHENSIVE METABOLIC PANEL - Abnormal; Notable for the following:       Result Value   Glucose, Bld 125 (*)    ALT 13 (*)    All other components within normal limits  BRAIN NATRIURETIC PEPTIDE - Abnormal; Notable for the following:    B Natriuretic Peptide 103.0 (*)    All other components within normal limits  CBC WITH DIFFERENTIAL/PLATELET - Abnormal; Notable for the following:    RBC 3.49 (*)    Hemoglobin 10.7 (*)    HCT 33.2 (*)    Lymphs Abs 0.6 (*)    All other components within normal limits  MAGNESIUM  TROPONIN I  PROTIME-INR  CBG MONITORING, ED    EKG  EKG Interpretation  Date/Time:  Friday April 05 2017 08:35:56 EDT Ventricular Rate:  80 PR Interval:  176 QRS Duration: 76 QT Interval:  382 QTC Calculation: 440 R  Axis:   2 Text Interpretation:  Normal sinus rhythm Artifact T wave abnormality Abnormal ekg Confirmed by Carmin Muskrat [phone]) on 04/05/2017 8:44:05 AM       Radiology Dg Chest 2 View  Result Date: 04/05/2017 CLINICAL DATA:  Epigastric pain, chest pain EXAM: CHEST  2 VIEW COMPARISON:  01/23/2017.  Chest CT 06/08/2013. FINDINGS: Mild prominence of the right mediastinal contour which may reflect ascending aortic aneurysm. Mild aneurysmal dilatation of the ascending aorta was noted on prior CT from 2014. No confluent airspace opacities or effusions. Heart is normal size. IMPRESSION: Aneurysmal dilatation of the ascending thoracic aorta noted. No active disease. Electronically Signed   By: Rolm Baptise M.D.   On: 04/05/2017 08:55    Procedures Procedures (including critical care time)  Medications Ordered in ED Medications  0.9 %  sodium chloride infusion (not administered)  aspirin chewable tablet 324 mg (324 mg Oral Given 04/05/17 0910)     Initial Impression / Assessment and Plan / ED Course  I have reviewed the triage vital signs and the nursing notes.  Pertinent labs & imaging results that were available during my care of the patient were reviewed by me and considered in my medical decision making (see chart for details).  10:37 AM Patient appears better, states that the nitroglycerin has helped. Initial studies notable for x-ray and stranding ascending aorta change, concerning for possible aneurysm. Chart review is notable for demonstration of this on prior study, and more notable for CT angiography performed 3 years ago with aneurysmal dilatation of the abdominal aorta with small dissection.  Given the patient's description of pain in the chest, abdomen, CT angiography will be performed.  Prior (pertenent) CT results as below IMPRESSION:   Multifocal ectatic and aneurysmal dilated abdominal aorta, measuring up to 3.2 cm. Small focal dissection at the level of the left  renal artery origin.   These results were called by telephone at the time of interpretation on 06/08/2013 at 11:05 PM to Dr. Betsey Holiday, who verbally acknowledged these results.     Electronically Signed   By: Carlos Levering M.D.   On: 06/08/2013 23:08   Update: Patient in no distress, but has mild persistent nausea. Patient is aware of all findings, as is her family member. With reassuring CT scan in terms of absence of aortic pathology, I discussed patient's case with our surgeon on-call, Dr. Dalbert Batman. Surgical team will follow as a consulting service per Patient had one attempt at eating, this was unsuccessful, with increasing nausea, no additional vomiting. Given the demonstration of a possible new small bowel obstruction, as well as her ongoing chest pain, the patient was admitted to the medicine service for further evaluation and management.  Final Clinical Impressions(s) / ED Diagnoses  Small bowel obstruction Atypical chest pain   Carmin Muskrat, MD 04/05/17 1442

## 2017-04-05 NOTE — Consult Note (Signed)
[facility] Surgery Consult Note  [name] 1930/09/12  [id number].    Requesting MD: Vanita Panda, MD Chief Complaint/Reason for Consult: SBO  HPI:  [name] is an 83 y.o. Female with a past medical history of HTN, GERD, asthma, COPD on home O2, Hx CVA, throacic aortic aneurysm (4.2 m), and hypothyroidism who presented to [facility] today with a cc of epigastric pain and chest pain. Pain started 3 days ago. Described as sharp, constant, non-radiating substernal discomfort.  Denies aggravating factors. Associated sxs include severe gastric reflux that progressed to nausea and vomiting - now relieved with zofran. Endorses throat discomfort, worse with swallowing. States she is unsure if she has passed flatus lately, feels as though its been a week since she has. Last BM was yesterday and was normal. At baseline she has a BM every other day and takes miralax once daily. She takes oxycodone for chronic pain 2/2 fibromyalgia/arthritis. Denies history of SBO or personal history of cancer.  Past abdominal surgeries: umbilical hernia repair  Tobacco use: former smoker, quit >40 years ago EtOH: none Blood thinning medications: none  Other: retired, lives at home with her husband, mobilizes without any assistive devices.  ED Workup: afebrile, VSS, no leukocytosis, troponin negative, normocytic anemia (hgb 10.7)  CXR: Aneurysmal dilatation of the ascending thoracic aorta   CTA chest/abdomen/pelvis:  4.2 cm ascending thoracic aortic aneurysm. 3.4 cm infrarenal  abdominal aortic aneurysm. No evidence of dissection. Dilated proximal and mid small bowel loops with decompressed  Distal small bowel loops.  ROS: Review of Systems  Constitutional: Negative for chills and fever.  Cardiovascular: Positive for chest pain.  Gastrointestinal: Positive for abdominal pain, heartburn, nausea and vomiting.  Musculoskeletal: Positive for joint pain and myalgias.  All other systems reviewed and are  negative.   Family History  Problem Relation Age of Onset  . Lung cancer Brother        x2  . Hypertension Father   . Heart disease Father   . Heart disease Mother   . Asthma Sister     Past Medical History:  Diagnosis Date  . Allergic rhinitis, cause unspecified   . Allergy, unspecified not elsewhere classified   . Anxiety   . Asthma   . Chronic kidney disease    frequency  . COPD (chronic obstructive pulmonary disease) ([place])   . Degeneration of intervertebral disc, site unspecified   . Diverticulosis of colon (without mention of hemorrhage)   . Esophageal reflux   . Headache   . Insomnia, unspecified   . Myalgia and myositis, unspecified   . Need for prophylactic hormone replacement therapy (postmenopausal)   . Osteoarthritis of right knee 08/27/2011   R knee pain, s/p arthroscopy 2012 per [name]   . Osteoporosis, unspecified   . Other abnormal blood chemistry   . Other and unspecified hyperlipidemia   . Other chronic pain   . Stroke [facility])    mini strokes  . Unspecified essential hypertension   . Unspecified hypothyroidism     Past Surgical History:  Procedure Laterality Date  . BACK SURGERY    . breast cystectomy    . JOINT REPLACEMENT     shoulder right  . KNEE ARTHROSCOPY     Right knee 2012  . MASS EXCISION Left 04/07/2015   Procedure: MINOR EXCISION OF MASS LEFT SMALL FINGER;  Surgeon: Leanora Cover, MD;  Location: [place];  Service: Orthopedics;  Laterality: Left;  . SHOULDER SURGERY    . TOTAL KNEE  ARTHROPLASTY  12/11/2011   Procedure: TOTAL KNEE ARTHROPLASTY;  Surgeon: Johnny Bridge, MD;  Location: [place];  Service: Orthopedics;  Laterality: Right;  . UMBILICAL HERNIA REPAIR N/A 06/09/2013   Procedure: EXPLORATIORY LAPAROTOMY, HERNIA REPAIR UMBILICAL, INSERTION OF MESH;  Surgeon: Haywood Lasso, MD;  Location: [place];  Service: General;  Laterality: N/A;    Social History:  reports that she quit smoking about 31 years ago. Her  smoking use included Cigarettes. She has a 15.00 pack-year smoking history. She has never used smokeless tobacco. She reports that she does not drink alcohol or use drugs.  Allergies:  Allergies  Allergen Reactions  . Doxycycline     GI upset  . Sulfadiazine     REACTION: Fever, achey  . Sulfa Antibiotics Rash     (Not in a hospital admission)  Blood pressure 134/79, pulse 76, temperature 98.4 F (36.9 C), temperature source Oral, resp. rate 20, SpO2 100 %. Physical Exam: Physical Exam  Constitutional: She is oriented to person, place, and time. She appears well-developed. No distress.  HENT:  Head: Normocephalic and atraumatic.  Right Ear: External ear normal.  Left Ear: External ear normal.  Mouth/Throat: Oropharynx is clear and moist.  Eyes: EOM are normal. Right eye exhibits no discharge. Left eye exhibits no discharge. No scleral icterus.  Neck: Normal range of motion. Neck supple. No tracheal deviation present.  Cardiovascular: Normal rate and regular rhythm.  Exam reveals no gallop and no friction rub.   Murmur heard. Pulmonary/Chest: Effort normal and breath sounds normal. No stridor. No respiratory distress. She has no wheezes. She has no rales. She exhibits no tenderness.  Nasal cannula in place.  Abdominal: Soft. Bowel sounds are normal. She exhibits distension. She exhibits no mass. There is tenderness. There is no rebound and no guarding. No hernia.  Mild global tenderness without rebound or guarding. No masses. Previous hernia site noted - no hernias on exam. Tympanic upper abdomen.  Musculoskeletal: Normal range of motion. She exhibits no edema or deformity.  Neurological: She is alert and oriented to person, place, and time. No sensory deficit.  Skin: Skin is warm and dry. No rash noted. She is not diaphoretic.  Psychiatric: She has a normal mood and affect. Her behavior is normal.    Results for orders placed or performed during the hospital encounter of  04/05/17 (from the past 48 hour(s))  Comprehensive metabolic panel     Status: Abnormal   Collection Time: 04/05/17  9:23 AM  Result Value Ref Range   Sodium 139 135 - 145 mmol/L   Potassium 3.5 3.5 - 5.1 mmol/L   Chloride 101 101 - 111 mmol/L   CO2 29 22 - 32 mmol/L   Glucose, Bld 125 (H) 65 - 99 mg/dL   BUN 12 6 - 20 mg/dL   Creatinine, Ser 0.68 0.44 - 1.00 mg/dL   Calcium 8.9 8.9 - 10.3 mg/dL   Total Protein 7.3 6.5 - 8.1 g/dL   Albumin 3.7 3.5 - 5.0 g/dL   AST 36 15 - 41 U/L   ALT 13 (L) 14 - 54 U/L   Alkaline Phosphatase 48 38 - 126 U/L   Total Bilirubin 1.0 0.3 - 1.2 mg/dL   GFR calc non Af Amer >60 >60 mL/min   GFR calc Af Amer >60 >60 mL/min    Comment: (NOTE) The eGFR has been calculated using the CKD EPI equation. This calculation has not been validated in all clinical situations. eGFR's persistently <60  mL/min signify possible Chronic Kidney Disease.    Anion gap 9 5 - 15  Magnesium     Status: None   Collection Time: 04/05/17  9:23 AM  Result Value Ref Range   Magnesium 1.8 1.7 - 2.4 mg/dL  Troponin I (order at [facility])     Status: None   Collection Time: 04/05/17  9:23 AM  Result Value Ref Range   Troponin I <0.03 <0.03 ng/mL  Brain natriuretic peptide (order if patient c/o SOB ONLY)     Status: Abnormal   Collection Time: 04/05/17  9:23 AM  Result Value Ref Range   B Natriuretic Peptide 103.0 (H) 0.0 - 100.0 pg/mL  CBC with Differential/Platelet     Status: Abnormal   Collection Time: 04/05/17  9:23 AM  Result Value Ref Range   WBC 8.6 4.0 - 10.5 K/uL   RBC 3.49 (L) 3.87 - 5.11 MIL/uL   Hemoglobin 10.7 (L) 12.0 - 15.0 g/dL   HCT 33.2 (L) 36.0 - 46.0 %   MCV 95.1 78.0 - 100.0 fL   MCH 30.7 26.0 - 34.0 pg   MCHC 32.2 30.0 - 36.0 g/dL   RDW 13.9 11.5 - 15.5 %   Platelets 211 150 - 400 K/uL   Neutrophils Relative % 89 %   Neutro Abs 7.6 1.7 - 7.7 K/uL   Lymphocytes Relative 7 %   Lymphs Abs 0.6 (L) 0.7 - 4.0 K/uL   Monocytes Relative 4 %   Monocytes  Absolute 0.3 0.1 - 1.0 K/uL   Eosinophils Relative 0 %   Eosinophils Absolute 0.0 0.0 - 0.7 K/uL   Basophils Relative 0 %   Basophils Absolute 0.0 0.0 - 0.1 K/uL  Protime-INR     Status: None   Collection Time: 04/05/17  9:23 AM  Result Value Ref Range   Prothrombin Time 14.1 11.4 - 15.2 seconds   INR 1.10    Dg Chest 2 View  Result Date: 04/05/2017 CLINICAL DATA:  Epigastric pain, chest pain EXAM: CHEST  2 VIEW COMPARISON:  01/23/2017.  Chest CT 06/08/2013. FINDINGS: Mild prominence of the right mediastinal contour which may reflect ascending aortic aneurysm. Mild aneurysmal dilatation of the ascending aorta was noted on prior CT from 2014. No confluent airspace opacities or effusions. Heart is normal size. IMPRESSION: Aneurysmal dilatation of the ascending thoracic aorta noted. No active disease. Electronically Signed   By: Rolm Baptise M.D.   On: 04/05/2017 08:55   Ct Angio Chest/abd/pel For Dissection W And/or W/wo  Addendum Date: 04/05/2017   ADDENDUM REPORT: 04/05/2017 11:46 ADDENDUM: Voice recognition error in the reproductive section of the abdomen CT report. The uterus measurement should read 17 x 13.4 x 12.0 cm. Electronically Signed   By: Rolm Baptise M.D.   On: 04/05/2017 11:46   Result Date: 04/05/2017 CLINICAL DATA:  Known aneurysm.  Evaluate for dissection EXAM: CT ANGIOGRAPHY CHEST, ABDOMEN AND PELVIS TECHNIQUE: Multidetector CT imaging through the chest, abdomen and pelvis was performed using the standard protocol during bolus administration of intravenous contrast. Multiplanar reconstructed images and MIPs were obtained and reviewed to evaluate the vascular anatomy. CONTRAST:  100 cc Isovue 370 IV COMPARISON:  06/08/2013 FINDINGS: CTA CHEST FINDINGS Cardiovascular: 4.2 cm ascending thoracic aortic aneurysm. Aortic large 3 cm in diameter. Proximal descending thoracic aorta 2.6 cm. Moderate atherosclerotic calcifications. Moderate calcifications in the coronary arteries. Heart is  normal size. No evidence of aortic dissection. Mediastinum/Nodes: No mediastinal, hilar, or axillary adenopathy. Lungs/Pleura: Lungs are clear. No focal  airspace opacities or suspicious nodules. No effusions. Musculoskeletal: No acute bony abnormality. Review of the MIP images confirms the above findings. CTA ABDOMEN AND PELVIS FINDINGS VASCULAR Aorta: Proximal abdominal aorta 3.1 cm. 3 cm at the right renal artery. Distal abdominal aorta at 3.4 cm. Ectasia of the thoracic aorta with diffuse atherosclerosis. No dissection. Celiac: Widely patent SMA: Widely patent Renals: Patent bilaterally.  No focal stenosis. IMA: Patent Inflow: Tortuous, patent.  No dissection. Veins: Grossly unremarkable Review of the MIP images confirms the above findings. NON-VASCULAR Hepatobiliary: No focal hepatic abnormality. Gallbladder unremarkable. Pancreas: Diffuse fatty replacement.  No ductal dilatation or mass. Spleen: No focal abnormality.  Normal size. Adrenals/Urinary Tract: No adrenal abnormality. No focal renal abnormality. No stones or hydronephrosis. Urinary bladder is unremarkable. Stomach/Bowel: Dilated small bowel loops in the upper and mid abdomen. Distal small bowel loops are decompressed. Findings concerning for small bowel obstruction. Large bowel unremarkable. Lymphatic: No adenopathy Reproductive: Markedly enlarged uterus, centrally stable since prior study. The uterus measures 17 x 813.4 x 12 cm. Other: No free fluid or free air. Musculoskeletal: No acute bony abnormality. Review of the MIP images confirms the above findings. IMPRESSION: 4.2 cm ascending thoracic aortic aneurysm. 3.4 cm infrarenal abdominal aortic aneurysm. No evidence of dissection. Recommend annual imaging followup by CTA or MRA. This recommendation follows 2010 ACCF/AHA/AATS/ACR/ASA/SCA/SCAI/SIR/STS/SVM Guidelines for the Diagnosis and Management of Patients with Thoracic Aortic Disease. Circulation. 2010; 121: E006-J494 Markedly enlarged,  heterogeneous uterus, similar to prior study, presumably fibroid uterus. Dilated proximal and mid small bowel loops with decompressed distal small bowel loops. Findings concerning for partial small bowel obstruction. Coronary artery disease. Electronically Signed: By: Rolm Baptise M.D. On: 04/05/2017 11:43      Assessment/Plan pSBO - possibly 2/2 intra-abdominal adhesions, no current hernias or masses - having BMs and nausea improved, minimal flatus. Mild distention. - recommend NPO and bowel rest. Can hold off on NG tube placement but would recommend placement for vomiting or worsening abdominal distention. - DG Abdomen in AM.  Recommend admission to hospitalist service for management of multiple medical problems listed above. General surgery will re-evaluate the patient tomorrow for further recommendations.   Jill Alexanders, [facility] Surgery 04/05/2017, 1:05 PM Pager: [phone] Consults: [phone] Mon-Fri 7:00 am-4:30 pm Sat-Sun 7:00 am-11:30 am\

## 2017-04-06 ENCOUNTER — Inpatient Hospital Stay (HOSPITAL_COMMUNITY): Payer: Medicare Other

## 2017-04-06 DIAGNOSIS — K56609 Unspecified intestinal obstruction, unspecified as to partial versus complete obstruction: Secondary | ICD-10-CM

## 2017-04-06 DIAGNOSIS — J9611 Chronic respiratory failure with hypoxia: Secondary | ICD-10-CM

## 2017-04-06 DIAGNOSIS — E876 Hypokalemia: Secondary | ICD-10-CM

## 2017-04-06 DIAGNOSIS — I10 Essential (primary) hypertension: Secondary | ICD-10-CM

## 2017-04-06 LAB — CBC
HCT: 30.6 % — ABNORMAL LOW (ref 36.0–46.0)
Hemoglobin: 9.6 g/dL — ABNORMAL LOW (ref 12.0–15.0)
MCH: 30 pg (ref 26.0–34.0)
MCHC: 31.4 g/dL (ref 30.0–36.0)
MCV: 95.6 fL (ref 78.0–100.0)
Platelets: 202 10*3/uL (ref 150–400)
RBC: 3.2 MIL/uL — ABNORMAL LOW (ref 3.87–5.11)
RDW: 13.7 % (ref 11.5–15.5)
WBC: 5.6 10*3/uL (ref 4.0–10.5)

## 2017-04-06 LAB — BASIC METABOLIC PANEL
Anion gap: 8 (ref 5–15)
BUN: 13 mg/dL (ref 6–20)
CO2: 27 mmol/L (ref 22–32)
Calcium: 8.3 mg/dL — ABNORMAL LOW (ref 8.9–10.3)
Chloride: 106 mmol/L (ref 101–111)
Creatinine, Ser: 0.61 mg/dL (ref 0.44–1.00)
GFR calc Af Amer: >60 mL/min (ref >60)
GFR calc non Af Amer: >60 mL/min (ref >60)
Glucose, Bld: 80 mg/dL (ref 65–99)
Potassium: 3 mmol/L — ABNORMAL LOW (ref 3.5–5.1)
Sodium: 141 mmol/L (ref 135–145)

## 2017-04-06 LAB — MAGNESIUM: Magnesium: 1.8 mg/dL (ref 1.7–2.4)

## 2017-04-06 MED ORDER — HYPROMELLOSE (GONIOSCOPIC) 2.5 % OP SOLN: 1.0000 [drp] | OPHTHALMIC | Status: DC | PRN

## 2017-04-06 MED ORDER — ALPRAZOLAM 0.5 MG PO TABS
0.5000 mg | ORAL_TABLET | Freq: Once | ORAL | Status: AC
  Administered 2017-04-06: 0.5 mg via ORAL
  Filled 2017-04-06: qty 1

## 2017-04-06 MED ORDER — ACETAMINOPHEN 325 MG PO TABS
650.0000 mg | ORAL_TABLET | Freq: Four times a day (QID) | ORAL | Status: DC | PRN
  Administered 2017-04-06: 650 mg via ORAL
  Filled 2017-04-06: qty 2

## 2017-04-06 MED ORDER — POTASSIUM CHLORIDE 10 MEQ/100ML IV SOLN
10.0000 meq | INTRAVENOUS | Status: AC
  Administered 2017-04-06 (×3): 10 meq via INTRAVENOUS
  Filled 2017-04-06 (×3): qty 100

## 2017-04-06 MED ORDER — KCL IN DEXTROSE-NACL 40-5-0.9 MEQ/L-%-% IV SOLN
INTRAVENOUS | Status: DC
  Administered 2017-04-06 – 2017-04-07 (×3): via INTRAVENOUS
  Filled 2017-04-06 (×3): qty 1000

## 2017-04-06 MED ORDER — PHENOL 1.4 % MT LIQD
1.0000 | OROMUCOSAL | Status: DC | PRN
  Administered 2017-04-06: 1 via OROMUCOSAL
  Filled 2017-04-06: qty 177

## 2017-04-06 MED ORDER — POLYVINYL ALCOHOL 1.4 % OP SOLN
1.0000 [drp] | OPHTHALMIC | Status: DC | PRN
  Filled 2017-04-06 (×2): qty 15

## 2017-04-06 NOTE — Progress Notes (Signed)
PROGRESS NOTE    [name]  SEG:[id number] DOB: [birth year] DOA: 04/05/2017 PCP: Tonia Ghent, MD   Brief Narrative: 83 year old female with history of anxiety, COPD on 2-3 L of oxygen at home, hypertension, prior stroke presented with nausea vomiting. CT scan of abdomen with partial small bowel obstruction. Seen by general surgery. Admitted for medical management.  Assessment & Plan:  # Partial small bowel obstruction: Patient is clinically improving. Starting clear liquid diet. No nausea or vomiting. Getting abdominal x-ray series per surgery. Continue IV fluid and supportive care. Advance as tolerated. -Change IV fluid with dextrose and potassium chloride.  #Hypokalemia: Replete potassium chloride. Repeat lab in the morning.  #History of COPD with chronic hypoxic respiratory failure: Continue oxygen and bronchodilators as needed.   #Hypertension: Norvasc. Monitor blood pressure.  #Hypothyroidism: Continue Synthroid.  #Anxiety: Continue Cymbalta and trazodone at bedtime.  #4.2 cm ascending thoracic aorta aneurysm, probably fibroid uterus: Recommend outpatient follow-up.  DVT prophylaxis: Lovenox subcutaneous Code Status: DNR/DNI Family Communication: No family at bedside Disposition Plan: Currently admitted    Consultants:   General surgery  Procedures: None Antimicrobials: None  Subjective: Seen and examined at bedside. Denied headache, dizziness, nausea vomiting chest pain or abdominal pain.  Objective: Vitals:   04/05/17 1859 04/05/17 2047 04/05/17 2130 04/06/17 0301  BP: (!) 137/104 (!) 155/72  116/73  Pulse: 87 75  77  Resp:      Temp: 98.8 F (37.1 C) 98.7 F (37.1 C)  98.6 F (37 C)  TempSrc: Oral Oral  Oral  SpO2: 94% 96% 95% 95%  Weight:    67.5 kg (148 lb 13 oz)    Intake/Output Summary (Last 24 hours) at 04/06/17 1206 Last data filed at 04/06/17 0900  Gross per 24 hour  Intake             1125 ml  Output                0 ml  Net              1125 ml   Filed Weights   04/06/17 0301  Weight: 67.5 kg (148 lb 13 oz)    Examination:  General exam: Appears calm and comfortable  Respiratory system: Clear to auscultation. Respiratory effort normal. No wheezing or crackle Cardiovascular system: S1 & S2 heard, RRR.  No pedal edema. Gastrointestinal system: Abdomen is nondistended, soft and nontender. Bowel sounds sluggish Central nervous system: Alert and oriented. No focal neurological deficits. Extremities: Symmetric 5 x 5 power. Skin: No rashes, lesions or ulcers Psychiatry: Judgement and insight appear normal. Mood & affect appropriate.     Data Reviewed: I have personally reviewed following labs and imaging studies  CBC:  Recent Labs Lab 04/05/17 0923 04/05/17 1853 04/06/17 0628  WBC 8.6 9.7 5.6  NEUTROABS 7.6  --   --   HGB 10.7* 10.7* 9.6*  HCT 33.2* 34.3* 30.6*  MCV 95.1 94.8 95.6  PLT 211 226 737   Basic Metabolic Panel:  Recent Labs Lab 04/05/17 0923 04/05/17 1853 04/06/17 0628 04/06/17 0847  NA 139  --  141  --   K 3.5  --  3.0*  --   CL 101  --  106  --   CO2 29  --  27  --   GLUCOSE 125*  --  80  --   BUN 12  --  13  --   CREATININE 0.68 0.71 0.61  --   CALCIUM 8.9  --  8.3*  --   MG 1.8  --   --  1.8   GFR: Estimated Creatinine Clearance: 44.9 mL/min (by C-G formula based on SCr of 0.61 mg/dL). Liver Function Tests:  Recent Labs Lab 04/05/17 0923  AST 36  ALT 13*  ALKPHOS 48  BILITOT 1.0  PROT 7.3  ALBUMIN 3.7   No results for input(s): LIPASE, AMYLASE in the last 168 hours. No results for input(s): AMMONIA in the last 168 hours. Coagulation Profile:  Recent Labs Lab 04/05/17 0923  INR 1.10   Cardiac Enzymes:  Recent Labs Lab 04/05/17 0923  TROPONINI <0.03   BNP (last 3 results) No results for input(s): PROBNP in the last 8760 hours. HbA1C: No results for input(s): HGBA1C in the last 72 hours. CBG: No results for input(s): GLUCAP in the last 168  hours. Lipid Profile: No results for input(s): CHOL, HDL, LDLCALC, TRIG, CHOLHDL, LDLDIRECT in the last 72 hours. Thyroid Function Tests: No results for input(s): TSH, T4TOTAL, FREET4, T3FREE, THYROIDAB in the last 72 hours. Anemia Panel: No results for input(s): VITAMINB12, FOLATE, FERRITIN, TIBC, IRON, RETICCTPCT in the last 72 hours. Sepsis Labs: No results for input(s): PROCALCITON, LATICACIDVEN in the last 168 hours.  No results found for this or any previous visit (from the past 240 hour(s)).       Radiology Studies: Dg Chest 2 View  Result Date: 04/05/2017 CLINICAL DATA:  Epigastric pain, chest pain EXAM: CHEST  2 VIEW COMPARISON:  01/23/2017.  Chest CT 06/08/2013. FINDINGS: Mild prominence of the right mediastinal contour which may reflect ascending aortic aneurysm. Mild aneurysmal dilatation of the ascending aorta was noted on prior CT from 2014. No confluent airspace opacities or effusions. Heart is normal size. IMPRESSION: Aneurysmal dilatation of the ascending thoracic aorta noted. No active disease. Electronically Signed   By: Rolm Baptise M.D.   On: 04/05/2017 08:55   Ct Angio Chest/abd/pel For Dissection W And/or W/wo  Addendum Date: 04/05/2017   ADDENDUM REPORT: 04/05/2017 11:46 ADDENDUM: Voice recognition error in the reproductive section of the abdomen CT report. The uterus measurement should read 17 x 13.4 x 12.0 cm. Electronically Signed   By: Rolm Baptise M.D.   On: 04/05/2017 11:46   Result Date: 04/05/2017 CLINICAL DATA:  Known aneurysm.  Evaluate for dissection EXAM: CT ANGIOGRAPHY CHEST, ABDOMEN AND PELVIS TECHNIQUE: Multidetector CT imaging through the chest, abdomen and pelvis was performed using the standard protocol during bolus administration of intravenous contrast. Multiplanar reconstructed images and MIPs were obtained and reviewed to evaluate the vascular anatomy. CONTRAST:  100 cc Isovue 370 IV COMPARISON:  06/08/2013 FINDINGS: CTA CHEST FINDINGS  Cardiovascular: 4.2 cm ascending thoracic aortic aneurysm. Aortic large 3 cm in diameter. Proximal descending thoracic aorta 2.6 cm. Moderate atherosclerotic calcifications. Moderate calcifications in the coronary arteries. Heart is normal size. No evidence of aortic dissection. Mediastinum/Nodes: No mediastinal, hilar, or axillary adenopathy. Lungs/Pleura: Lungs are clear. No focal airspace opacities or suspicious nodules. No effusions. Musculoskeletal: No acute bony abnormality. Review of the MIP images confirms the above findings. CTA ABDOMEN AND PELVIS FINDINGS VASCULAR Aorta: Proximal abdominal aorta 3.1 cm. 3 cm at the right renal artery. Distal abdominal aorta at 3.4 cm. Ectasia of the thoracic aorta with diffuse atherosclerosis. No dissection. Celiac: Widely patent SMA: Widely patent Renals: Patent bilaterally.  No focal stenosis. IMA: Patent Inflow: Tortuous, patent.  No dissection. Veins: Grossly unremarkable Review of the MIP images confirms the above findings. NON-VASCULAR Hepatobiliary: No focal hepatic abnormality. Gallbladder unremarkable. Pancreas:  Diffuse fatty replacement.  No ductal dilatation or mass. Spleen: No focal abnormality.  Normal size. Adrenals/Urinary Tract: No adrenal abnormality. No focal renal abnormality. No stones or hydronephrosis. Urinary bladder is unremarkable. Stomach/Bowel: Dilated small bowel loops in the upper and mid abdomen. Distal small bowel loops are decompressed. Findings concerning for small bowel obstruction. Large bowel unremarkable. Lymphatic: No adenopathy Reproductive: Markedly enlarged uterus, centrally stable since prior study. The uterus measures 17 x 813.4 x 12 cm. Other: No free fluid or free air. Musculoskeletal: No acute bony abnormality. Review of the MIP images confirms the above findings. IMPRESSION: 4.2 cm ascending thoracic aortic aneurysm. 3.4 cm infrarenal abdominal aortic aneurysm. No evidence of dissection. Recommend annual imaging followup by  CTA or MRA. This recommendation follows 2010 ACCF/AHA/AATS/ACR/ASA/SCA/SCAI/SIR/STS/SVM Guidelines for the Diagnosis and Management of Patients with Thoracic Aortic Disease. Circulation. 2010; 121: V779-T903 Markedly enlarged, heterogeneous uterus, similar to prior study, presumably fibroid uterus. Dilated proximal and mid small bowel loops with decompressed distal small bowel loops. Findings concerning for partial small bowel obstruction. Coronary artery disease. Electronically Signed: By: Rolm Baptise M.D. On: 04/05/2017 11:43        Scheduled Meds: . amLODipine  2.5 mg Oral Daily  . DULoxetine  60 mg Oral Daily  . enoxaparin (LOVENOX) injection  40 mg Subcutaneous Q24H  . levothyroxine  112 mcg Oral QAC breakfast  . tiotropium  18 mcg Inhalation Daily  . traZODone  100 mg Oral QHS   Continuous Infusions: . dextrose 5 % and 0.9 % NaCl with KCl 40 mEq/L 100 mL/hr at 04/06/17 0946     LOS: 1 day    Khole Branch Tanna Furry, MD [facility] Pager [phone]  If 7PM-7AM, please contact night-coverage [email or web address] Password [email or web address] 04/06/2017, 12:06 PM

## 2017-04-06 NOTE — Progress Notes (Signed)
Subjective/Chief Complaint: No complaints. Feels better today   Objective: Vital signs in last 24 hours: Temp:  [98.6 F (37 C)-98.8 F (37.1 C)] 98.6 F (37 C) (09/29 0301) Pulse Rate:  [75-87] 77 (09/29 0301) Resp:  [14-24] 14 (09/28 1800) BP: (116-164)/(72-104) 116/73 (09/29 0301) SpO2:  [94 %-100 %] 95 % (09/29 0301) Weight:  [67.5 kg (148 lb 13 oz)] 67.5 kg (148 lb 13 oz) (09/29 0301) Last BM Date: 04/04/17  Intake/Output from previous day: 09/28 0701 - 09/29 0700 In: 885 [I.V.:885] Out: -  Intake/Output this shift: No intake/output data recorded.  General appearance: alert and cooperative Resp: clear to auscultation bilaterally Cardio: regular rate and rhythm GI: soft, mild LLQ tenderness. good bs. bm overnight. palpable lower abd mass  Lab Results:   Recent Labs  04/05/17 1853 04/06/17 0628  WBC 9.7 5.6  HGB 10.7* 9.6*  HCT 34.3* 30.6*  PLT 226 202   BMET  Recent Labs  04/05/17 0923 04/05/17 1853 04/06/17 0628  NA 139  --  141  K 3.5  --  3.0*  CL 101  --  106  CO2 29  --  27  GLUCOSE 125*  --  80  BUN 12  --  13  CREATININE 0.68 0.71 0.61  CALCIUM 8.9  --  8.3*   PT/INR  Recent Labs  04/05/17 0923  LABPROT 14.1  INR 1.10   ABG No results for input(s): PHART, HCO3 in the last 72 hours.  Invalid input(s): PCO2, PO2  Studies/Results: Dg Chest 2 View  Result Date: 04/05/2017 CLINICAL DATA:  Epigastric pain, chest pain EXAM: CHEST  2 VIEW COMPARISON:  01/23/2017.  Chest CT 06/08/2013. FINDINGS: Mild prominence of the right mediastinal contour which may reflect ascending aortic aneurysm. Mild aneurysmal dilatation of the ascending aorta was noted on prior CT from 2014. No confluent airspace opacities or effusions. Heart is normal size. IMPRESSION: Aneurysmal dilatation of the ascending thoracic aorta noted. No active disease. Electronically Signed   By: Rolm Baptise M.D.   On: 04/05/2017 08:55   Ct Angio Chest/abd/pel For Dissection W  And/or W/wo  Addendum Date: 04/05/2017   ADDENDUM REPORT: 04/05/2017 11:46 ADDENDUM: Voice recognition error in the reproductive section of the abdomen CT report. The uterus measurement should read 17 x 13.4 x 12.0 cm. Electronically Signed   By: Rolm Baptise M.D.   On: 04/05/2017 11:46   Result Date: 04/05/2017 CLINICAL DATA:  Known aneurysm.  Evaluate for dissection EXAM: CT ANGIOGRAPHY CHEST, ABDOMEN AND PELVIS TECHNIQUE: Multidetector CT imaging through the chest, abdomen and pelvis was performed using the standard protocol during bolus administration of intravenous contrast. Multiplanar reconstructed images and MIPs were obtained and reviewed to evaluate the vascular anatomy. CONTRAST:  100 cc Isovue 370 IV COMPARISON:  06/08/2013 FINDINGS: CTA CHEST FINDINGS Cardiovascular: 4.2 cm ascending thoracic aortic aneurysm. Aortic large 3 cm in diameter. Proximal descending thoracic aorta 2.6 cm. Moderate atherosclerotic calcifications. Moderate calcifications in the coronary arteries. Heart is normal size. No evidence of aortic dissection. Mediastinum/Nodes: No mediastinal, hilar, or axillary adenopathy. Lungs/Pleura: Lungs are clear. No focal airspace opacities or suspicious nodules. No effusions. Musculoskeletal: No acute bony abnormality. Review of the MIP images confirms the above findings. CTA ABDOMEN AND PELVIS FINDINGS VASCULAR Aorta: Proximal abdominal aorta 3.1 cm. 3 cm at the right renal artery. Distal abdominal aorta at 3.4 cm. Ectasia of the thoracic aorta with diffuse atherosclerosis. No dissection. Celiac: Widely patent SMA: Widely patent Renals: Patent bilaterally.  No  focal stenosis. IMA: Patent Inflow: Tortuous, patent.  No dissection. Veins: Grossly unremarkable Review of the MIP images confirms the above findings. NON-VASCULAR Hepatobiliary: No focal hepatic abnormality. Gallbladder unremarkable. Pancreas: Diffuse fatty replacement.  No ductal dilatation or mass. Spleen: No focal abnormality.   Normal size. Adrenals/Urinary Tract: No adrenal abnormality. No focal renal abnormality. No stones or hydronephrosis. Urinary bladder is unremarkable. Stomach/Bowel: Dilated small bowel loops in the upper and mid abdomen. Distal small bowel loops are decompressed. Findings concerning for small bowel obstruction. Large bowel unremarkable. Lymphatic: No adenopathy Reproductive: Markedly enlarged uterus, centrally stable since prior study. The uterus measures 17 x 813.4 x 12 cm. Other: No free fluid or free air. Musculoskeletal: No acute bony abnormality. Review of the MIP images confirms the above findings. IMPRESSION: 4.2 cm ascending thoracic aortic aneurysm. 3.4 cm infrarenal abdominal aortic aneurysm. No evidence of dissection. Recommend annual imaging followup by CTA or MRA. This recommendation follows 2010 ACCF/AHA/AATS/ACR/ASA/SCA/SCAI/SIR/STS/SVM Guidelines for the Diagnosis and Management of Patients with Thoracic Aortic Disease. Circulation. 2010; 121: V564-P329 Markedly enlarged, heterogeneous uterus, similar to prior study, presumably fibroid uterus. Dilated proximal and mid small bowel loops with decompressed distal small bowel loops. Findings concerning for partial small bowel obstruction. Coronary artery disease. Electronically Signed: By: Rolm Baptise M.D. On: 04/05/2017 11:43    Anti-infectives: Anti-infectives    None      Assessment/Plan: s/p * No surgery found * Advance diet. Start clears today if xrays look better Recheck abd xray this am Large uterus. stable ambulate  LOS: 1 day    TOTH III,PAUL S 04/06/2017

## 2017-04-07 LAB — BASIC METABOLIC PANEL
Anion gap: 4 — ABNORMAL LOW (ref 5–15)
BUN: <5 mg/dL — ABNORMAL LOW (ref 6–20)
CO2: 29 mmol/L (ref 22–32)
Calcium: 8.4 mg/dL — ABNORMAL LOW (ref 8.9–10.3)
Chloride: 107 mmol/L (ref 101–111)
Creatinine, Ser: 0.49 mg/dL (ref 0.44–1.00)
GFR calc Af Amer: >60 mL/min (ref >60)
GFR calc non Af Amer: >60 mL/min (ref >60)
Glucose, Bld: 109 mg/dL — ABNORMAL HIGH (ref 65–99)
Potassium: 3.7 mmol/L (ref 3.5–5.1)
Sodium: 140 mmol/L (ref 135–145)

## 2017-04-07 NOTE — Care Management Note (Signed)
Case Management Note  Patient Details  Name: [name] MRN: [id number] Date of Birth: 1930-07-21  Subjective/Objective:  83 y.o. To be discharged home today with resumption of Oxygen provided by [facility]. No home tank needed for Transport.                   Action/Plan:CM will sign off for now but will be available should additional discharge needs arise or disposition change.    Expected Discharge Date:  04/07/17               Expected Discharge Plan:  Home/Self Care  In-House Referral:  NA  Discharge planning Services  CM Consult  Post Acute Care Choice:  Resumption of Svcs/PTA Provider Choice offered to:  Patient  DME Arranged:  [facility] DME Agency:     HH Arranged:    [place] Agency:     Status of Service:  Completed, signed off  If discussed at [place] of Stay Meetings, dates discussed:    Additional Comments:  Delrae Sawyers, RN 04/07/2017, 12:29 PM

## 2017-04-07 NOTE — Progress Notes (Signed)
Subjective/Chief Complaint: No complaints. Having bm's   Objective: Vital signs in last 24 hours: Temp:  [98.1 F (36.7 C)-98.4 F (36.9 C)] 98.1 F (36.7 C) (09/30 0354) Pulse Rate:  [75-98] 98 (09/30 0354) BP: (161-162)/(66-85) 161/66 (09/30 0354) SpO2:  [91 %-98 %] 98 % (09/30 0354) Last BM Date: 04/05/17  Intake/Output from previous day: 09/29 0701 - 09/30 0700 In: 2643.3 [P.O.:720; I.V.:1923.3] Out: -  Intake/Output this shift: No intake/output data recorded.  General appearance: alert and cooperative Resp: clear to auscultation bilaterally Cardio: regular rate and rhythm GI: soft, nontender.  Lab Results:   Recent Labs  04/05/17 1853 04/06/17 0628  WBC 9.7 5.6  HGB 10.7* 9.6*  HCT 34.3* 30.6*  PLT 226 202   BMET  Recent Labs  04/06/17 0628 04/07/17 0444  NA 141 140  K 3.0* 3.7  CL 106 107  CO2 27 29  GLUCOSE 80 109*  BUN 13 <5*  CREATININE 0.61 0.49  CALCIUM 8.3* 8.4*   PT/INR  Recent Labs  04/05/17 0923  LABPROT 14.1  INR 1.10   ABG No results for input(s): PHART, HCO3 in the last 72 hours.  Invalid input(s): PCO2, PO2  Studies/Results: Acute Abdominal Series  Result Date: 04/06/2017 CLINICAL DATA:  83 year old female with a history of small bowel obstruction EXAM: DG ABDOMEN ACUTE W/ 1V CHEST COMPARISON:  CT 04/05/2017, plain film chest 04/05/2017 FINDINGS: Chest: Cardiomediastinal silhouette unchanged with tortuous aorta and calcifications of the aortic arch. No evidence of central vascular congestion. Chronic interstitial opacities without new airspace opacity. No pneumothorax. Abdomen: Gas within stomach, small bowel, colon. No abnormal distention. Gas extends to the rectum. No unexpected soft tissue density. No unexpected radiopaque foreign body. Scoliotic curvature of the spine with apex right curvature centered at the thoracolumbar junction. Surgical changes of the right shoulder again demonstrated. No displaced fracture.  IMPRESSION: Chest: Chronic lung changes without evidence of acute cardiopulmonary disease. Abdomen: Nonobstructive bowel gas pattern. Electronically Signed   By: Corrie Mckusick D.O.   On: 04/06/2017 14:09   Ct Angio Chest/abd/pel For Dissection W And/or W/wo  Addendum Date: 04/05/2017   ADDENDUM REPORT: 04/05/2017 11:46 ADDENDUM: Voice recognition error in the reproductive section of the abdomen CT report. The uterus measurement should read 17 x 13.4 x 12.0 cm. Electronically Signed   By: Rolm Baptise M.D.   On: 04/05/2017 11:46   Result Date: 04/05/2017 CLINICAL DATA:  Known aneurysm.  Evaluate for dissection EXAM: CT ANGIOGRAPHY CHEST, ABDOMEN AND PELVIS TECHNIQUE: Multidetector CT imaging through the chest, abdomen and pelvis was performed using the standard protocol during bolus administration of intravenous contrast. Multiplanar reconstructed images and MIPs were obtained and reviewed to evaluate the vascular anatomy. CONTRAST:  100 cc Isovue 370 IV COMPARISON:  06/08/2013 FINDINGS: CTA CHEST FINDINGS Cardiovascular: 4.2 cm ascending thoracic aortic aneurysm. Aortic large 3 cm in diameter. Proximal descending thoracic aorta 2.6 cm. Moderate atherosclerotic calcifications. Moderate calcifications in the coronary arteries. Heart is normal size. No evidence of aortic dissection. Mediastinum/Nodes: No mediastinal, hilar, or axillary adenopathy. Lungs/Pleura: Lungs are clear. No focal airspace opacities or suspicious nodules. No effusions. Musculoskeletal: No acute bony abnormality. Review of the MIP images confirms the above findings. CTA ABDOMEN AND PELVIS FINDINGS VASCULAR Aorta: Proximal abdominal aorta 3.1 cm. 3 cm at the right renal artery. Distal abdominal aorta at 3.4 cm. Ectasia of the thoracic aorta with diffuse atherosclerosis. No dissection. Celiac: Widely patent SMA: Widely patent Renals: Patent bilaterally.  No focal stenosis. IMA: Patent  Inflow: Tortuous, patent.  No dissection. Veins: Grossly  unremarkable Review of the MIP images confirms the above findings. NON-VASCULAR Hepatobiliary: No focal hepatic abnormality. Gallbladder unremarkable. Pancreas: Diffuse fatty replacement.  No ductal dilatation or mass. Spleen: No focal abnormality.  Normal size. Adrenals/Urinary Tract: No adrenal abnormality. No focal renal abnormality. No stones or hydronephrosis. Urinary bladder is unremarkable. Stomach/Bowel: Dilated small bowel loops in the upper and mid abdomen. Distal small bowel loops are decompressed. Findings concerning for small bowel obstruction. Large bowel unremarkable. Lymphatic: No adenopathy Reproductive: Markedly enlarged uterus, centrally stable since prior study. The uterus measures 17 x 813.4 x 12 cm. Other: No free fluid or free air. Musculoskeletal: No acute bony abnormality. Review of the MIP images confirms the above findings. IMPRESSION: 4.2 cm ascending thoracic aortic aneurysm. 3.4 cm infrarenal abdominal aortic aneurysm. No evidence of dissection. Recommend annual imaging followup by CTA or MRA. This recommendation follows 2010 ACCF/AHA/AATS/ACR/ASA/SCA/SCAI/SIR/STS/SVM Guidelines for the Diagnosis and Management of Patients with Thoracic Aortic Disease. Circulation. 2010; 121: J856-D149 Markedly enlarged, heterogeneous uterus, similar to prior study, presumably fibroid uterus. Dilated proximal and mid small bowel loops with decompressed distal small bowel loops. Findings concerning for partial small bowel obstruction. Coronary artery disease. Electronically Signed: By: Rolm Baptise M.D. On: 04/05/2017 11:43    Anti-infectives: Anti-infectives    None      Assessment/Plan: s/p * No surgery found * Advance diet. Allow soft foods today If she tolerates this then she would be ok for discharge from surgey standpoint  LOS: 2 days    TOTH III,[place] Barcellos S 04/07/2017

## 2017-04-07 NOTE — Discharge Summary (Addendum)
Physician Discharge Summary  [name] YJE:[id number] DOB: 08/06/1930 DOA: 04/05/2017  PCP: Tonia Ghent, MD  Admit date: 04/05/2017 Discharge date: 04/07/2017  Admitted From:home Disposition:home  Recommendations for Outpatient Follow-up:  1. Follow up with PCP in 1-2 weeks 2. Please obtain BMP/CBC in one week  Home Health:no Equipment/Devices:resume home o2 Discharge Condition:stable CODE STATUS:DNR Diet recommendation:heart healthy  Brief/Interim Summary: 83 year old female with history of anxiety, COPD on 2-3 L of oxygen at home, hypertension, prior stroke presented with nausea vomiting. CT scan of abdomen with partial small bowel obstruction. Seen by general surgery. Admitted for medical management.  # Partial small bowel obstruction: Seen by general surgery. Managed conservatively with significant clinical improvement. Patient now with bowel movement. Tolerating diet well. She had soft diet today. No nausea or vomiting. She wants to go home. Recommended to follow up with PCP and soft diet at home. Verbalized understanding.  #Hypokalemia: Improved. Recommended outpatient lab monitoring  #History of COPD with chronic hypoxic respiratory failure: Continue oxygen and bronchodilators as needed. Uses 2-3 liters of oxygen at home   #Hypertension: Norvasc, Lasix. Elevated blood pressure the hospital likely due to not receiving Lasix. Continue to monitor blood pressure and follow-up with PCP.  #Hypothyroidism: Continue Synthroid.  #Anxiety: Continue Cymbalta and trazodone at bedtime.  #4.2 cm ascending thoracic aorta aneurysm, probably fibroid uterus: Recommend outpatient follow-up.  Discharge Diagnoses:  Principal Problem:   SBO (small bowel obstruction) (HCC) Active Problems:   Chronic pain syndrome   Fibromyalgia   Chronic respiratory failure (HCC)   HTN (hypertension)   Abdominal pain    Discharge Instructions  Discharge Instructions    Call MD for:   difficulty breathing, headache or visual disturbances    Complete by:  As directed    Call MD for:  extreme fatigue    Complete by:  As directed    Call MD for:  hives    Complete by:  As directed    Call MD for:  persistant dizziness or light-headedness    Complete by:  As directed    Call MD for:  persistant nausea and vomiting    Complete by:  As directed    Call MD for:  severe uncontrolled pain    Complete by:  As directed    Call MD for:  temperature >100.4    Complete by:  As directed    Diet - low sodium heart healthy    Complete by:  As directed    Discharge instructions    Complete by:  As directed    You have 4.2 cm ascending thoracic aortic aneurysm. 3.4 cm infrarenal abdominal aortic aneurysm.Markedly enlarged, heterogeneous uterus, similar to prior study, presumably fibroid uterus. Please follow up with your PCP.   Increase activity slowly    Complete by:  As directed      Allergies as of 04/07/2017      Reactions   Doxycycline    GI upset   Sulfadiazine    REACTION: Fever, achey   Sulfa Antibiotics Rash      Medication List    TAKE these medications   albuterol 108 (90 Base) MCG/ACT inhaler Commonly known as:  PROVENTIL HFA;VENTOLIN HFA Inhale 1-2 puffs into the lungs every 6 (six) hours as needed for wheezing or shortness of breath.   ALPRAZolam 1 MG tablet Commonly known as:  XANAX TAKE 1/2 TABLET BY MOUTH TWICE A DAY AS NEEDED AS DIRECTED.   amLODipine 2.5 MG tablet Commonly known as:  NORVASC  Take 1 tablet (2.5 mg total) by mouth daily.   CALTRATE 600 PO Take 1 tablet by mouth daily.   cholecalciferol 1000 units tablet Commonly known as:  VITAMIN D Take 1 tablet (1,000 Units total) by mouth daily.   diclofenac sodium 1 % Gel Commonly known as:  VOLTAREN Apply 4 g topically 2 (two) times daily as needed (pain).   DULoxetine 60 MG capsule Commonly known as:  CYMBALTA Take 1 capsule (60 mg total) by mouth daily.   fluticasone 50 MCG/ACT  nasal spray Commonly known as:  FLONASE Place 1-2 sprays into both nostrils daily as needed for allergies.   fluticasone furoate-vilanterol 200-25 MCG/INH Aepb Commonly known as:  BREO ELLIPTA Inhale 1 puff into the lungs daily. What changed:  when to take this  reasons to take this   furosemide 20 MG tablet Commonly known as:  LASIX TAKE 1/2 TO 1 TABLET BY MOUTH EVERY MORNING AS NEEDED FOR SWELLING. USE SPARINGLY.  Hold it for one week from now.   gabapentin 300 MG capsule Commonly known as:  NEURONTIN Take 1 capsule (300 mg total) by mouth 3 (three) times daily.   ibuprofen 200 MG tablet Commonly known as:  ADVIL,MOTRIN Take 600 mg by mouth daily as needed for mild pain.   levothyroxine 112 MCG tablet Commonly known as:  SYNTHROID, LEVOTHROID TAKE 1 TABLET BY MOUTH DAILY BEFORE BREAKFAST.   montelukast 10 MG tablet Commonly known as:  SINGULAIR Take 1 tablet (10 mg total) by mouth daily as needed (allergies).   multivitamin capsule Take 1 capsule by mouth daily.   NON FORMULARY Place 2.5-3 L into the nose daily. OXYGEN   Oxycodone HCl 10 MG Tabs Take 1 tablet (10 mg total) by mouth 3 (three) times daily. Fill on/after 03/06/2017 What changed:  when to take this  additional instructions   potassium chloride 10 MEQ tablet Commonly known as:  K-DUR Take 1 tablet (10 mEq total) by mouth daily as needed (If taking lasix/furosemide).   ranitidine 150 MG tablet Commonly known as:  ZANTAC Take 1 tablet (150 mg total) by mouth daily.   tiotropium 18 MCG inhalation capsule Commonly known as:  SPIRIVA HANDIHALER PLACE 1 CAPSULE INTO INHALER AND INHALE ONCE A DAY   traZODone 50 MG tablet Commonly known as:  DESYREL TAKE 2 TABLETS (100 MG TOTAL) BY MOUTH AT BEDTIME            Durable Medical Equipment        Start     Ordered   04/07/17 1217  DME Oxygen  Once    Question Answer Comment  Mode or (Route) Nasal cannula   Liters per Minute 2   Frequency  Continuous (stationary and portable oxygen unit needed)   Oxygen conserving device Yes   Oxygen delivery system Gas      04/07/17 1217       Discharge Care Instructions        Start     Ordered   04/07/17 0000  Increase activity slowly     04/07/17 1109   04/07/17 0000  Diet - low sodium heart healthy     04/07/17 1109   04/07/17 0000  Call MD for:  temperature >100.4     04/07/17 1109   04/07/17 0000  Call MD for:  persistant nausea and vomiting     04/07/17 1109   04/07/17 0000  Call MD for:  severe uncontrolled pain     04/07/17 1109   04/07/17  0000  Call MD for:  difficulty breathing, headache or visual disturbances     04/07/17 1109   04/07/17 0000  Call MD for:  hives     04/07/17 1109   04/07/17 0000  Call MD for:  persistant dizziness or light-headedness     04/07/17 1109   04/07/17 0000  Call MD for:  extreme fatigue     04/07/17 1109   04/07/17 0000  Discharge instructions    Comments:  You have 4.2 cm ascending thoracic aortic aneurysm. 3.4 cm infrarenal abdominal aortic aneurysm.Markedly enlarged, heterogeneous uterus, similar to prior study, presumably fibroid uterus. Please follow up with your PCP.   04/07/17 1111     Follow-up Information    Tonia Ghent, MD. Schedule an appointment as soon as possible for a visit in 1 week(s).   Specialty:  Family Medicine Contact information: [place] [address] [phone]          Allergies  Allergen Reactions  . Doxycycline     GI upset  . Sulfadiazine     REACTION: Fever, achey  . Sulfa Antibiotics Rash    Consultations: General surgery  Procedures/Studies: CT scan of abdomen  Subjective: Seen and examined at bedside. Denied headache, dizziness, nausea vomiting chest pain and shortness of breath. Had  bowel movement. Tolerating diet well. Wanted to go home.  Discharge Exam: Vitals:   04/06/17 2032 04/07/17 0354  BP: (!) 162/85 (!) 161/66  Pulse: 75 98  Resp:     Temp: 98.4 F (36.9 C) 98.1 F (36.7 C)  SpO2: 91% 98%   Vitals:   04/05/17 2130 04/06/17 0301 04/06/17 2032 04/07/17 0354  BP:  116/73 (!) 162/85 (!) 161/66  Pulse:  77 75 98  Resp:      Temp:  98.6 F (37 C) 98.4 F (36.9 C) 98.1 F (36.7 C)  TempSrc:  Oral Oral Oral  SpO2: 95% 95% 91% 98%  Weight:  67.5 kg (148 lb 13 oz)      General: Pt is alert, awake, not in acute distress Cardiovascular: RRR, S1/S2 +, no rubs, no gallops Respiratory: CTA bilaterally, no wheezing, no rhonchi Abdominal: Soft, NT, ND, bowel sounds + Extremities: no edema, no cyanosis    The results of significant diagnostics from this hospitalization (including imaging, microbiology, ancillary and laboratory) are listed below for reference.     Microbiology: No results found for this or any previous visit (from the past 240 hour(s)).   Labs: BNP (last 3 results)  Recent Labs  04/05/17 0923  BNP 767.3*   Basic Metabolic Panel:  Recent Labs Lab 04/05/17 0923 04/05/17 1853 04/06/17 0628 04/06/17 0847 04/07/17 0444  NA 139  --  141  --  140  K 3.5  --  3.0*  --  3.7  CL 101  --  106  --  107  CO2 29  --  27  --  29  GLUCOSE 125*  --  80  --  109*  BUN 12  --  13  --  <5*  CREATININE 0.68 0.71 0.61  --  0.49  CALCIUM 8.9  --  8.3*  --  8.4*  MG 1.8  --   --  1.8  --    Liver Function Tests:  Recent Labs Lab 04/05/17 0923  AST 36  ALT 13*  ALKPHOS 48  BILITOT 1.0  PROT 7.3  ALBUMIN 3.7   No results for input(s): LIPASE, AMYLASE in the  last 168 hours. No results for input(s): AMMONIA in the last 168 hours. CBC:  Recent Labs Lab 04/05/17 0923 04/05/17 1853 04/06/17 0628  WBC 8.6 9.7 5.6  NEUTROABS 7.6  --   --   HGB 10.7* 10.7* 9.6*  HCT 33.2* 34.3* 30.6*  MCV 95.1 94.8 95.6  PLT 211 226 202   Cardiac Enzymes:  Recent Labs Lab 04/05/17 0923  TROPONINI <0.03   BNP: Invalid input(s): POCBNP CBG: No results for input(s): GLUCAP in the last 168  hours. D-Dimer No results for input(s): DDIMER in the last 72 hours. Hgb A1c No results for input(s): HGBA1C in the last 72 hours. Lipid Profile No results for input(s): CHOL, HDL, LDLCALC, TRIG, CHOLHDL, LDLDIRECT in the last 72 hours. Thyroid function studies No results for input(s): TSH, T4TOTAL, T3FREE, THYROIDAB in the last 72 hours.  Invalid input(s): FREET3 Anemia work up No results for input(s): VITAMINB12, FOLATE, FERRITIN, TIBC, IRON, RETICCTPCT in the last 72 hours. Urinalysis    Component Value Date/Time   COLORURINE YELLOW 01/24/2017 0020   APPEARANCEUR HAZY (A) 01/24/2017 0020   LABSPEC 1.009 01/24/2017 0020   PHURINE 7.0 01/24/2017 0020   GLUCOSEU NEGATIVE 01/24/2017 0020   GLUCOSEU NEGATIVE 10/24/2016 1429   HGBUR NEGATIVE 01/24/2017 0020   BILIRUBINUR NEGATIVE 01/24/2017 0020   BILIRUBINUR negative 09/20/2016 1625   KETONESUR NEGATIVE 01/24/2017 0020   PROTEINUR NEGATIVE 01/24/2017 0020   UROBILINOGEN 0.2 10/24/2016 1429   NITRITE NEGATIVE 01/24/2017 0020   LEUKOCYTESUR LARGE (A) 01/24/2017 0020   Sepsis Labs Invalid input(s): PROCALCITONIN,  WBC,  LACTICIDVEN Microbiology No results found for this or any previous visit (from the past 240 hour(s)).   Time coordinating discharge: 27 minutes  SIGNED:   Rosita Fire, MD  [facility] 04/07/2017, 12:17 PM  If 7PM-7AM, please contact night-coverage [email or web address] Password [email or web address]

## 2017-04-09 ENCOUNTER — Ambulatory Visit (INDEPENDENT_AMBULATORY_CARE_PROVIDER_SITE_OTHER): Payer: Medicare Other | Admitting: Family Medicine

## 2017-04-09 ENCOUNTER — Encounter: Payer: Self-pay | Admitting: Family Medicine

## 2017-04-09 VITALS — BP 160/78 | HR 84 | Temp 98.5°F | Wt 146.2 lb

## 2017-04-09 DIAGNOSIS — I712 Thoracic aortic aneurysm, without rupture, unspecified: Secondary | ICD-10-CM

## 2017-04-09 DIAGNOSIS — M545 Low back pain: Secondary | ICD-10-CM

## 2017-04-09 DIAGNOSIS — D649 Anemia, unspecified: Secondary | ICD-10-CM | POA: Diagnosis not present

## 2017-04-09 DIAGNOSIS — K56600 Partial intestinal obstruction, unspecified as to cause: Secondary | ICD-10-CM | POA: Diagnosis not present

## 2017-04-09 LAB — CBC WITH DIFFERENTIAL/PLATELET
Basophils Absolute: 0 10*3/uL (ref 0.0–0.1)
Basophils Relative: 0.9 % (ref 0.0–3.0)
Eosinophils Absolute: 0.1 10*3/uL (ref 0.0–0.7)
Eosinophils Relative: 1.9 % (ref 0.0–5.0)
HCT: 35 % — ABNORMAL LOW (ref 36.0–46.0)
Hemoglobin: 11.4 g/dL — ABNORMAL LOW (ref 12.0–15.0)
Lymphocytes Relative: 34.6 % (ref 12.0–46.0)
Lymphs Abs: 1.7 10*3/uL (ref 0.7–4.0)
MCHC: 32.6 g/dL (ref 30.0–36.0)
MCV: 94.8 fl (ref 78.0–100.0)
Monocytes Absolute: 0.4 10*3/uL (ref 0.1–1.0)
Monocytes Relative: 8 % (ref 3.0–12.0)
Neutro Abs: 2.7 10*3/uL (ref 1.4–7.7)
Neutrophils Relative %: 54.6 % (ref 43.0–77.0)
Platelets: 252 10*3/uL (ref 150.0–400.0)
RBC: 3.69 Mil/uL — ABNORMAL LOW (ref 3.87–5.11)
RDW: 14 % (ref 11.5–15.5)
WBC: 5 10*3/uL (ref 4.0–10.5)

## 2017-04-09 LAB — BASIC METABOLIC PANEL
BUN: 10 mg/dL (ref 6–23)
CO2: 34 mEq/L — ABNORMAL HIGH (ref 19–32)
Calcium: 9.6 mg/dL (ref 8.4–10.5)
Chloride: 98 mEq/L (ref 96–112)
Creatinine, Ser: 0.61 mg/dL (ref 0.40–1.20)
GFR: 99.28 mL/min (ref >60.00)
Glucose, Bld: 97 mg/dL (ref 70–99)
Potassium: 3.5 mEq/L (ref 3.5–5.1)
Sodium: 139 mEq/L (ref 135–145)

## 2017-04-09 LAB — IBC PANEL
Iron: 37 ug/dL — ABNORMAL LOW (ref 42–145)
Saturation Ratios: 9.2 % — ABNORMAL LOW (ref 20.0–50.0)
Transferrin: 286 mg/dL (ref 212.0–360.0)

## 2017-04-09 MED ORDER — POLYETHYLENE GLYCOL 3350 17 GM/SCOOP PO POWD: ORAL | Status: DC

## 2017-04-09 MED ORDER — OXYCODONE HCL 10 MG PO TABS: 10.0000 mg | ORAL_TABLET | Freq: Three times a day (TID) | ORAL | Status: DC

## 2017-04-09 NOTE — Patient Instructions (Signed)
Go to the lab on the way out.  We'll contact you with your lab report. Keep taking miralax daily in the meantime.  Take care.  Update [place] as needed.  Glad to see you.

## 2017-04-09 NOTE — Progress Notes (Signed)
Admit date: 04/05/2017 Discharge date: 04/07/2017  Admitted From:home Disposition:home  Recommendations for Outpatient Follow-up:  1. Follow up with PCP in 1-2 weeks 2. Please obtain BMP/CBC in one week  Home Health:no Equipment/Devices:resume home o2 Discharge Condition:stable CODE STATUS:DNR Diet recommendation:heart healthy  Brief/Interim Summary: 83 year old female with history of anxiety, COPD on 2-3 L of oxygen at home, hypertension, prior stroke presented with nausea vomiting. CT scan of abdomen with partial small bowel obstruction. Seen by general surgery. Admitted for medical management.  # Partial small bowel obstruction: Seen by general surgery. Managed conservatively with significant clinical improvement. Patient now with bowel movement. Tolerating diet well. She had soft diet today. No nausea or vomiting. She wants to go home. Recommended to follow up with PCP and soft diet at home. Verbalized understanding.  #Hypokalemia: Improved. Recommended outpatient lab monitoring  #History of COPD with chronic hypoxic respiratory failure: Continue oxygen and bronchodilators as needed. Uses 2-3 liters of oxygen at home  #Hypertension: Norvasc, Lasix. Elevated blood pressure the hospital likely due to not receiving Lasix. Continue to monitor blood pressure and follow-up with PCP.  #Hypothyroidism: Continue Synthroid.  #Anxiety: Continue Cymbalta and trazodone at bedtime.  #4.2 cm ascending thoracic aorta aneurysm, probably fibroid uterus: Recommend outpatient follow-up.  Discharge Diagnoses:  Principal Problem:   SBO (small bowel obstruction) (HCC) Active Problems:   Chronic pain syndrome   Fibromyalgia   Chronic respiratory failure (HCC)   HTN (hypertension)   Abdominal pain ========================================= Profuse brown vomit initially.  Admitted to hospital after a few days of sx.  PSBO.  Treated conservatively.  Due for flu labs with low K hx noted.   Still on BP meds at discharge.  PSBO path/phys d/w pt.    She is on pain meds at baseline, but on lower dose than prev (down from QID to TID dosing).  She has chronic back pain.  D/w pt about trying to limit constipation from opiates with bowel regimen.   Imaging d/w pt.  4.2 cm ascending thoracic aortic aneurysm. 3.4 cm infrarenal abdominal aortic aneurysm.  Neither would require intervention now and she wouldn't want surgery even if that were proposed.    H/o longstanding anemia.  Recheck labs pending.   PMH and SH reviewed  ROS: Per HPI unless specifically indicated in ROS section   Meds, vitals, and allergies reviewed.   On O2 NAD MMM rrr ctab abd soft, not ttp Ext w/o edema.

## 2017-04-10 DIAGNOSIS — I712 Thoracic aortic aneurysm, without rupture, unspecified: Secondary | ICD-10-CM | POA: Insufficient documentation

## 2017-04-10 HISTORY — DX: Thoracic aortic aneurysm, without rupture, unspecified: I71.20

## 2017-04-10 HISTORY — DX: Thoracic aortic aneurysm, without rupture: I71.2

## 2017-04-10 NOTE — Assessment & Plan Note (Signed)
D/w pt.   4.2 cm ascending thoracic aortic aneurysm. 3.4 cm infrarenal abdominal aortic aneurysm.  Neither would require intervention now and she wouldn't want surgery even if that were proposed.

## 2017-04-10 NOTE — Assessment & Plan Note (Addendum)
History of partial small bowel obstruction.  She was treated supportively. Did not require NG tube or other invasive procedure. Pathophysiology discussed with patient. Goal to limit opiates as much as possible and continue bowel regimen to try to limit constipation. Benign abdominal exam now. Normal bowel movements. No abdominal pain. Okay to continue as is. She agrees.  Continue current medications as is. See notes on recheck labs.

## 2017-04-10 NOTE — Assessment & Plan Note (Signed)
History of. Long-standing. Likely would not tolerate invasive workup given her other comorbid conditions. Check routine labs today. If her iron level is low it may be best to periodically replete her iron stores.  We would have to balance oral iron use against possible constipation. See notes on labs. >25 minutes spent in face to face time with patient, >50% spent in counselling or coordination of care.

## 2017-04-18 ENCOUNTER — Other Ambulatory Visit: Payer: Self-pay | Admitting: *Deleted

## 2017-04-18 NOTE — Telephone Encounter (Signed)
Faxed refill request. Alprazolam Last office visit:   04/09/17 Last Filled:    30 tablet 2 11/23/2016  Please advise.

## 2017-04-19 MED ORDER — ALPRAZOLAM 1 MG PO TABS: 0.5000 mg | ORAL_TABLET | Freq: Two times a day (BID) | ORAL | 2 refills | Status: DC | PRN

## 2017-04-19 NOTE — Telephone Encounter (Signed)
Please call in.  Thanks.   

## 2017-04-19 NOTE — Telephone Encounter (Addendum)
Unable to phone in Rx due to power outage.  Medication phoned to pharmacy.

## 2017-04-22 NOTE — Telephone Encounter (Addendum)
Pt called because [facility] does not have alprazolam refills. I spoke with  [facility] and they just got back their power but they do have alprazolam refills. Pt will ck with pharmacy to make sure rx ready for pick up.

## 2017-05-06 ENCOUNTER — Telehealth: Payer: Self-pay | Admitting: Family Medicine

## 2017-05-06 DIAGNOSIS — D649 Anemia, unspecified: Secondary | ICD-10-CM

## 2017-05-06 DIAGNOSIS — G8929 Other chronic pain: Secondary | ICD-10-CM

## 2017-05-06 DIAGNOSIS — M545 Low back pain: Secondary | ICD-10-CM

## 2017-05-06 MED ORDER — OXYCODONE HCL 10 MG PO TABS: 10.0000 mg | ORAL_TABLET | Freq: Three times a day (TID) | ORAL | 0 refills | Status: DC

## 2017-05-06 NOTE — Telephone Encounter (Signed)
Copied from [place] [id number]. Topic: Quick Communication - See Telephone Encounter >> May 06, 2017  2:19 PM Robina Ade, Helene Kelp D wrote: CRM for notification. See Telephone encounter for: 05/06/17. Patient needs to talk to triage nurse about a medication.

## 2017-05-06 NOTE — Telephone Encounter (Signed)
rec'd phone call from pt.  Requested a refill on her Oxycodone HCL 10 mg.  The pt. is requesting to have prescription written for 90 tablets.  Stated she takes it for Fibromyalgia and Arthritis.  She is requesting a call back if the Rx can be written, so she can pick it up, at the office.  Her phone # is [phone]. Thank you.

## 2017-05-06 NOTE — Telephone Encounter (Signed)
Patient advised.  Rx left at front desk for pick up. 

## 2017-05-06 NOTE — Telephone Encounter (Signed)
Printed.  Thanks.  

## 2017-05-24 ENCOUNTER — Ambulatory Visit: Payer: Self-pay | Admitting: Podiatry

## 2017-06-29 ENCOUNTER — Other Ambulatory Visit: Payer: Self-pay | Admitting: Family Medicine

## 2017-07-01 ENCOUNTER — Other Ambulatory Visit: Payer: Self-pay | Admitting: Family Medicine

## 2017-07-29 ENCOUNTER — Ambulatory Visit: Payer: Self-pay | Admitting: *Deleted

## 2017-07-29 NOTE — Telephone Encounter (Signed)
Pt reports "head spinning" at rest and with positional changes. States gait unsteady, loss of balance with ambulation. Reports 8.5/10 headache, minimal relief with IBU. Also states vision "little blurrier than usual."Denies nausea, no one sided numbness/tingling, no speech difficulties. Pt had called earlier and agent made appt with practice for Friday, called back for earlier appt when symptoms worsened. Instructed pt to go to ED, adamantly declined. Offered to call 911 for pt., declined. Declined UC.  Lengthy conversation with patient re: need for ED disposition.  Requesting appt with Dr. Damita Dunnings, Same Day appt made for tomorrow at 1030. Pt states she will go to ED if symptoms worsen. Care Advice given.   Reason for Disposition . SEVERE dizziness (vertigo) (e.g., unable to walk without assistance)  Answer Assessment - Initial Assessment Questions 1. DESCRIPTION: "Describe your dizziness."    "Head feels like it will spin off." 2. VERTIGO: "Do you feel like either you or the room is spinning or tilting?"      Room is spinning a little. "Mostly my head is spinning." 3. LIGHTHEADED: "Do you feel lightheaded?" (e.g., somewhat faint, woozy, weak upon standing)     Yes 4. SEVERITY: "How bad is it?"  "Can you walk?"   - MILD - Feels unsteady but walking normally.   - MODERATE - Feels very unsteady when walking, but not falling; interferes with normal activities (e.g., school, work) .   - SEVERE - Unable to walk without falling (requires assistance).     Severe, loses balance when walking (uses walker) No falls 5. ONSET:  "When did the dizziness begin?"     Yesterday. 6. AGGRAVATING FACTORS: "Does anything make it worse?" (e.g., standing, change in head position)     Positional, moving head. 7. CAUSE: "What do you think is causing the dizziness?"     Not sure 8. RECURRENT SYMPTOM: "Have you had dizziness before?" If so, ask: "When was the last time?" "What happened that time?"    "Off and on for  years, last week last time.""I take dramamine and that usually helps but not this time." 9. OTHER SYMPTOMS: "Do you have any other symptoms?" (e.g., headache, weakness, numbness, vomiting, earache)     Constant headache 8.5/10.  Vision is "little blurry."  Took IBU, little relief.Denies nausea, denies one sided numbness or weakness, no tingling.   Onset this AM.  Protocols used: DIZZINESS - VERTIGO-A-AH

## 2017-07-30 ENCOUNTER — Encounter: Payer: Self-pay | Admitting: Family Medicine

## 2017-07-30 ENCOUNTER — Ambulatory Visit: Payer: Medicare Other | Admitting: Family Medicine

## 2017-07-30 DIAGNOSIS — F411 Generalized anxiety disorder: Secondary | ICD-10-CM | POA: Diagnosis not present

## 2017-07-30 DIAGNOSIS — G894 Chronic pain syndrome: Secondary | ICD-10-CM | POA: Diagnosis not present

## 2017-07-30 DIAGNOSIS — R42 Dizziness and giddiness: Secondary | ICD-10-CM | POA: Diagnosis not present

## 2017-07-30 DIAGNOSIS — J0100 Acute maxillary sinusitis, unspecified: Secondary | ICD-10-CM | POA: Diagnosis not present

## 2017-07-30 MED ORDER — IBUPROFEN 200 MG PO TABS: 600.0000 mg | ORAL_TABLET | Freq: Two times a day (BID) | ORAL | Status: DC | PRN

## 2017-07-30 MED ORDER — MECLIZINE HCL 25 MG PO TABS: 25.0000 mg | ORAL_TABLET | Freq: Three times a day (TID) | ORAL | 0 refills | Status: DC | PRN

## 2017-07-30 MED ORDER — AMOXICILLIN-POT CLAVULANATE 875-125 MG PO TABS: 1.0000 | ORAL_TABLET | Freq: Two times a day (BID) | ORAL | 0 refills | Status: DC

## 2017-07-30 NOTE — Telephone Encounter (Signed)
I spoke with pt and today dizziness is "some better"; pt almost toppled over in bathroom this morning. Advised pt to be careful and not fall. Pt said the h/a is no better; pt states "she feels like her brain is loose". Pt does not want to go to UC or ED and wants to see Dr Damita Dunnings today at 10:30. Advised pt the person bringing her can come in and we will bring out w/c so she will not have to walk. Pt denies any weakness in arms or legs. No slurred speech. FYI to Dr Damita Dunnings.

## 2017-07-30 NOTE — Progress Notes (Signed)
She stopped taking oxycodone with a previous taper.  She is taking ibuprofen.  She isn't taking tylenol yet.  She was asking about a trial of tramadol.  D/w pt.  See AVS.   She has been episodically dizzy over the last few days.  She can get lightheaded and the room can spin.  Worse with head turning, esp in the bed when turning over.  She may not have sx with standing up, if she doesn't rotate her head.  Dramamine helps a little.    She has B ear pain recently.  No fevers.  No vomiting.  No facial droop, no ext weakness.    She needed a refill on her xanax.  D/w pt.  No ADE on med.  Used prn.   PMH and SH reviewed  ROS: Per HPI unless specifically indicated in ROS section   Meds, vitals, and allergies reviewed.   nad No vertigo sx with eye tracking if not moving her head.  ncat In wheelchair TM wnl B Nasal exam stuffy.  R max and frontal sinuses ttp.   OP wnl Neck supple, no LA rrr Exp wheeze noted B but this is mild and she has no focal dec in BS, no inc wob.

## 2017-07-30 NOTE — Patient Instructions (Addendum)
Take tylenol up to 3 times a day. Take ibuprofen with food.  Start augmentin.  Keep using your inhalers.  Take meclizine for vertigo.  Update me as needed.  Take care.  Glad to see you.

## 2017-07-31 DIAGNOSIS — J0100 Acute maxillary sinusitis, unspecified: Secondary | ICD-10-CM | POA: Insufficient documentation

## 2017-07-31 MED ORDER — ALPRAZOLAM 1 MG PO TABS: 0.5000 mg | ORAL_TABLET | Freq: Two times a day (BID) | ORAL | 2 refills | Status: DC | PRN

## 2017-07-31 NOTE — Assessment & Plan Note (Signed)
Likely from BPV, not reasonable to get patient up on exam table for DHP.  Would use meclizine and update me as needed.

## 2017-07-31 NOTE — Assessment & Plan Note (Signed)
Start augmentin, see AVS.  Okay for outpatient f/u.  Continue inhaler use.

## 2017-07-31 NOTE — Assessment & Plan Note (Signed)
Okay to continue prn BZD, no ADE on med.  She has tapered off oxycodone.

## 2017-07-31 NOTE — Assessment & Plan Note (Signed)
Off oxycodone, add on tylenol.  Update me as needed.  Treat vertigo, etc first before making other changes.  She agrees.

## 2017-08-02 ENCOUNTER — Ambulatory Visit: Payer: Medicare Other | Admitting: Family Medicine

## 2017-08-14 DIAGNOSIS — K219 Gastro-esophageal reflux disease without esophagitis: Secondary | ICD-10-CM | POA: Insufficient documentation

## 2017-08-14 DIAGNOSIS — H9113 Presbycusis, bilateral: Secondary | ICD-10-CM | POA: Insufficient documentation

## 2017-08-14 DIAGNOSIS — H6123 Impacted cerumen, bilateral: Secondary | ICD-10-CM | POA: Insufficient documentation

## 2017-08-15 ENCOUNTER — Other Ambulatory Visit: Payer: Self-pay | Admitting: Family Medicine

## 2017-08-26 ENCOUNTER — Other Ambulatory Visit: Payer: Self-pay | Admitting: Family Medicine

## 2017-09-17 ENCOUNTER — Ambulatory Visit: Payer: Self-pay

## 2017-09-17 ENCOUNTER — Telehealth: Payer: Self-pay

## 2017-09-17 NOTE — Telephone Encounter (Signed)
Needs OV.  Thanks. 

## 2017-09-17 NOTE — Telephone Encounter (Signed)
Appointment scheduled.

## 2017-09-17 NOTE — Telephone Encounter (Addendum)
Pt c/o of lightheadedness and dizziness since the last of January. Standing, leaning over, moving in bed makes it worse. States it is moderate to severe in severity. Pt is also c/o headache. States she took Ibuprofen and is a 9/10. See below "Initial assessment questions." Of note, pt has been out of antivert "for over a month"  Last refill was 07/30/17 for 30 tablets. Pt is refusing office visit. [employer] and informed her of triage and refusal to make an OV.  Reason for Disposition . [1] MODERATE dizziness (e.g., interferes with normal activities) AND [2] has been evaluated by physician for this  Answer Assessment - Initial Assessment Questions 1. DESCRIPTION: "Describe your dizziness."     Occurs with standing up or leaning over to either to the right or the left. Occurs with movement in the bed. 2. LIGHTHEADED: "Do you feel lightheaded?" (e.g., somewhat faint, woozy, weak upon standing)     Yes 3. VERTIGO: "Do you feel like either you or the room is spinning or tilting?" (i.e. vertigo)     No Vertigo 4. SEVERITY: "How bad is it?"  "Do you feel like you are going to faint?" "Can you stand and walk?"   - MILD - walking normally   - MODERATE - interferes with normal activities (e.g., work, school)    - SEVERE - unable to stand, requires support to walk, feels like passing out now.      Moderate to severe 5. ONSET:  "When did the dizziness begin?"     End of january 6. AGGRAVATING FACTORS: "Does anything make it worse?" (e.g., standing, change in head position)     Standing,turning head, leaning over, changing positions in bed. 7. HEART RATE: "Can you tell me your heart rate?" "How many beats in 15 seconds?"  (Note: not all patients can do this)       18 = 72 bpm 8. CAUSE: "What do you think is causing the dizziness?"     "I think there is something growing in my head or a blood vessel is stopped up or kinked 9. RECURRENT SYMPTOM: "Have you had dizziness before?" If so, ask: "When was the  last time?" "What happened that time?"     Yes The last time was 7-8 years ago-couldn't walk was lightheaded and dizzy. States she took Dramamine and slept then the dizziness was gone. States she has had lightheadedness on and off since then. 10. OTHER SYMPTOMS: "Do you have any other symptoms?" (e.g., fever, chest pain, vomiting, diarrhea, bleeding)       Acid reflux 11. PREGNANCY: "Is there any chance you are pregnant?" "When was your last menstrual period?"       n/a  Protocols used: DIZZINESS [facility]

## 2017-09-17 NOTE — Telephone Encounter (Signed)
I spoke with pt; pt has dizziness on and off; not dizzy now; sometimes when pt moves head she feels like she is going to pass out. Pt head hurts a lot of the time; right now headache is a pain level 10. Pt said she feels like she felt when she was seen 07/2017. I explained pt would need to be seen before doing MRI or referral. Pt said she does not want to make appt because he saw her in Jan for the same thing.Please advise.

## 2017-09-17 NOTE — Telephone Encounter (Signed)
See 09/17/17 phone note.

## 2017-09-17 NOTE — Telephone Encounter (Signed)
Copied from [place]. Topic: Referral - Request >> Sep 17, 2017  1:18 PM Arletha Grippe wrote: Reason for CRM: pt is asking for a referral and mri to neurosurgery. She feels like something very bad is wrong with her head.  She would like to talk to [facility].

## 2017-09-18 MED ORDER — MECLIZINE HCL 25 MG PO TABS: 25.0000 mg | ORAL_TABLET | Freq: Three times a day (TID) | ORAL | 1 refills | Status: DC | PRN

## 2017-09-18 NOTE — Telephone Encounter (Signed)
Patient notified as instructed by telephone and verbalized understanding. 

## 2017-09-18 NOTE — Addendum Note (Signed)
Addended by: Tonia Ghent on: 09/18/2017 11:22 AM   Modules accepted: Orders

## 2017-09-18 NOTE — Telephone Encounter (Addendum)
Okay to use meclizine if needed but still needs OV.  rx sent to [facility], [place].  Thanks.

## 2017-09-19 ENCOUNTER — Ambulatory Visit (INDEPENDENT_AMBULATORY_CARE_PROVIDER_SITE_OTHER)
Admission: RE | Admit: 2017-09-19 | Discharge: 2017-09-19 | Disposition: A | Discharge status: Home / Self Care | Payer: Medicare Other | Source: Ambulatory Visit | Attending: Family Medicine | Admitting: Family Medicine

## 2017-09-19 ENCOUNTER — Ambulatory Visit: Payer: Medicare Other | Admitting: Family Medicine

## 2017-09-19 ENCOUNTER — Encounter: Payer: Self-pay | Admitting: Family Medicine

## 2017-09-19 VITALS — BP 130/70 | HR 89 | Temp 98.5°F | Wt 148.2 lb

## 2017-09-19 DIAGNOSIS — R51 Headache: Secondary | ICD-10-CM | POA: Diagnosis not present

## 2017-09-19 DIAGNOSIS — R42 Dizziness and giddiness: Secondary | ICD-10-CM | POA: Diagnosis not present

## 2017-09-19 DIAGNOSIS — R519 Headache, unspecified: Secondary | ICD-10-CM

## 2017-09-19 MED ORDER — GABAPENTIN 300 MG PO CAPS: ORAL_CAPSULE | ORAL | Status: DC

## 2017-09-19 NOTE — Patient Instructions (Addendum)
Go to the lab on the way out.  We'll contact you with your xray report. Use meclizine for the room spinning.  Try a higher dose of gabapentin.  Take 2 tabs in the AM and 3 tabs in the PM. See if that helps the headaches.    We may need to get you over the spine clinic.  Take care.  Glad to see you.

## 2017-09-19 NOTE — Progress Notes (Signed)
When she turns to the left and leans to the left she has a more headaches.  That motion causes headache also.  The same sx happen with leaning/turning to the right.    She has room spinning with the episodes.  She doesn't have the same sx with eye tracking.  If she is looking straight ahead, she doesn't have the same trouble.    She hasn't restarted meclizine use in the meantime, unclear how much that has helped in the past.  D/w pt.   She has longstanding vertigo and HA, for years.    She had been having HAs recently.  She has a different pain that goes up the occiput on either side.  No ear pain.  No fevers.  She has been taking tylenol for the pain, occ taking ibuprofen for the pain.    She had tapered off oxycodone in the past.  She is taking gabapentin 600mg  BID at baseline.  She has tried heat and ice on her neck and heat helps more.   Meds, vitals, and allergies reviewed.   ROS: Per HPI unless specifically indicated in ROS section   GEN: nad, alert and oriented, chronically ill-appearing lady in no acute distress, on O2 at baseline.  HEENT: mucous membranes moist NECK: supple w/o LA, not a stiff neck, but she has tender paraspinal muscles at the occiput bilaterally without rash. CV: rrr.  PULM: ctab, no inc wob ABD: soft, +bs EXT: no edema SKIN: no acute rash CN 2-12 wnl B, S/S/DTR wnl x4.  she does have symptoms of vertigo with head turning but not with eye tracking.

## 2017-09-22 NOTE — Assessment & Plan Note (Signed)
She has long-standing vertigo that I do not think is related to the gabapentin.  She has inducible symptoms with head rotation but not tracking.  Continue to use meclizine as needed.

## 2017-09-22 NOTE — Assessment & Plan Note (Signed)
It may be that occipital neuralgia is contributing to her HA.  nonfocal neuro exam.  D/w pt about anatomy. Will try a higher dose of gabapentin.  Take 2 tabs in the AM and 3 tabs in the PM, to see if that helps the headaches.   D/w pt.  Check plain films.  See notes on imaging.  She agrees with plan.  >25 minutes spent in face to face time with patient, >50% spent in counselling or coordination of care.

## 2017-10-09 ENCOUNTER — Other Ambulatory Visit: Payer: Self-pay | Admitting: Family Medicine

## 2017-10-09 NOTE — Telephone Encounter (Signed)
No answer at home number and no answering machine.

## 2017-10-09 NOTE — Telephone Encounter (Signed)
Unable to reach patient at home number, telephone just rang and no answering machine. Will try again later.

## 2017-10-09 NOTE — Telephone Encounter (Signed)
Please see if the higher dose of gabapentin helped/was tolerated.  Let me know and I can work on this.  Thanks.

## 2017-10-09 NOTE — Telephone Encounter (Signed)
Electronic refill request Last office visit 09/19/17 Please confirm directions see office notes 09/19/17

## 2017-10-15 NOTE — Telephone Encounter (Signed)
Left a message with husband on 10/11/17 asking patient to return the call with the information as the patient was sleeping at the time.   10/15/17  Left detailed message on voicemail asking for a return call with information.

## 2017-10-15 NOTE — Telephone Encounter (Signed)
Patient says she thinks the Gabapentin is helping to sleep fairly well but she is having a great deal of pain and can hardly walk.  Patient says she stopped the Hydrocodone on her own because of so much media coverage about opioids.

## 2017-10-16 NOTE — Telephone Encounter (Signed)
She can try 2 tabs in the AM, 3 tabs in the PM with an extra tab midday if needed/tolerated. See how that does for pain and update me.  Thanks.  rx sent.

## 2017-10-16 NOTE — Telephone Encounter (Signed)
Patient notified as instructed by telephone and verbalized understanding. 

## 2017-11-05 ENCOUNTER — Other Ambulatory Visit: Payer: Self-pay | Admitting: Neurosurgery

## 2017-11-05 DIAGNOSIS — M542 Cervicalgia: Secondary | ICD-10-CM

## 2017-11-06 ENCOUNTER — Other Ambulatory Visit: Payer: Self-pay | Admitting: Neurosurgery

## 2017-11-06 DIAGNOSIS — G8929 Other chronic pain: Secondary | ICD-10-CM

## 2017-11-06 DIAGNOSIS — R51 Headache: Principal | ICD-10-CM

## 2017-11-06 DIAGNOSIS — M542 Cervicalgia: Secondary | ICD-10-CM

## 2017-11-08 ENCOUNTER — Other Ambulatory Visit: Payer: Self-pay | Admitting: Family Medicine

## 2017-11-08 NOTE — Telephone Encounter (Addendum)
Electronic refill request. Alprazolam Last office visit:   09/19/17 Last Filled:    30 tablet 2 07/31/2017  Please advise.

## 2017-11-09 ENCOUNTER — Other Ambulatory Visit: Payer: Medicare Other

## 2017-11-10 NOTE — Telephone Encounter (Signed)
Sent. Thanks.   

## 2017-11-11 ENCOUNTER — Other Ambulatory Visit: Payer: Self-pay | Admitting: Family Medicine

## 2018-01-07 ENCOUNTER — Other Ambulatory Visit: Payer: Self-pay | Admitting: Family Medicine

## 2018-01-07 NOTE — Telephone Encounter (Signed)
Electronic refill request.  [employer] HFA Last office visit:   09/19/17 Last Filled:    18 g 12 07/24/2016  Please advise.

## 2018-01-08 NOTE — Telephone Encounter (Signed)
Sent. Thanks.   

## 2018-01-25 ENCOUNTER — Other Ambulatory Visit: Payer: Self-pay | Admitting: Family Medicine

## 2018-02-06 ENCOUNTER — Other Ambulatory Visit: Payer: Self-pay | Admitting: Family Medicine

## 2018-02-06 NOTE — Telephone Encounter (Signed)
Name of Medication: Alprazolam Name of Pharmacy: [facility] or Written Date and Quantity:  30 tablet 2 11/10/2017  Last Office Visit and Type: 09/19/17 Acute Next Office Visit and Type: None Last Controlled Substance Agreement Date:  01/12/15 Last UDS: 04/15/15

## 2018-02-06 NOTE — Telephone Encounter (Addendum)
Sent. Thanks.  Due for AMW with [facility] and then f/u when possible.

## 2018-02-07 ENCOUNTER — Encounter: Payer: Self-pay | Admitting: *Deleted

## 2018-02-07 NOTE — Telephone Encounter (Signed)
Letter mailed

## 2018-02-28 ENCOUNTER — Other Ambulatory Visit: Payer: Self-pay | Admitting: Family Medicine

## 2018-02-28 NOTE — Telephone Encounter (Signed)
Electronic refill request.  Last office visit:   09/19/17 Last TSH:  01/24/17  !!!!! Last Filled:    90 tablet 1 08/15/2017  Please advise.

## 2018-03-02 NOTE — Telephone Encounter (Signed)
rx sent.  29min OV when possible, we can do labs at the visit.  Doesn't need to fast.  Thanks.

## 2018-03-03 ENCOUNTER — Encounter: Payer: Self-pay | Admitting: *Deleted

## 2018-03-03 NOTE — Telephone Encounter (Signed)
Letter mailed

## 2018-03-17 ENCOUNTER — Telehealth: Payer: Self-pay

## 2018-03-17 NOTE — Telephone Encounter (Signed)
Copied from [place] [phone] [phone]. Topic: Appointment Scheduling - Scheduling Inquiry for Clinic >> Mar 17, 2018 12:51 PM Reyne Dumas L wrote: Reason for CRM:   Pt states she needs a follow up with Dr. Damita Dunnings which I scheduled for 09/19.  Pt wants to know if she needs labwork before that appointment.  Spoke with patient in details. Patient received 2 letters on different days in August. One said physical and one said follow up visit. Patient wanted to do physical and was scheduled for 03/24/18 (she did not want to wait for October or later). Due to transportation patient could not come and see Lattie Haw prior and could not get labs prior to that day. ([facility] does not have anything open for 03/24/18). Patient states she will come a few minutes prior to her appointment to go ahead and get labs done before seen Dr Damita Dunnings. Explained to patient that we will not have results in before her appointment if they are drawn just a few minutes prior to her visit. Patient understood but still wanted to have it done this way.  Please place an order for labs. If you have question about this please let me know. Thank Edrick Kins, RMA

## 2018-03-18 NOTE — Telephone Encounter (Signed)
Left message for patient to call back-Shakerria Parran V Renesha Lizama, RMA  

## 2018-03-18 NOTE — Telephone Encounter (Signed)
Patient advised as stated below-[name] [name], RMA

## 2018-03-18 NOTE — Telephone Encounter (Signed)
If she isn't going to have labs done days prior to the visit, then it would be better to do the labs on the way out after the visit.  I can put the orders in at the visit.  That way we are more likely to get everything done the first time.  Thanks.

## 2018-03-24 ENCOUNTER — Encounter: Payer: Self-pay | Admitting: Family Medicine

## 2018-03-24 ENCOUNTER — Ambulatory Visit (INDEPENDENT_AMBULATORY_CARE_PROVIDER_SITE_OTHER): Payer: Medicare Other | Admitting: Family Medicine

## 2018-03-24 VITALS — BP 130/56 | HR 86 | Temp 98.5°F | Ht 60.0 in | Wt 149.2 lb

## 2018-03-24 DIAGNOSIS — E039 Hypothyroidism, unspecified: Secondary | ICD-10-CM

## 2018-03-24 DIAGNOSIS — J9611 Chronic respiratory failure with hypoxia: Secondary | ICD-10-CM

## 2018-03-24 DIAGNOSIS — E559 Vitamin D deficiency, unspecified: Secondary | ICD-10-CM

## 2018-03-24 DIAGNOSIS — Z0000 Encounter for general adult medical examination without abnormal findings: Secondary | ICD-10-CM

## 2018-03-24 DIAGNOSIS — D649 Anemia, unspecified: Secondary | ICD-10-CM | POA: Diagnosis not present

## 2018-03-24 DIAGNOSIS — I10 Essential (primary) hypertension: Secondary | ICD-10-CM | POA: Diagnosis not present

## 2018-03-24 DIAGNOSIS — G894 Chronic pain syndrome: Secondary | ICD-10-CM

## 2018-03-24 DIAGNOSIS — Z7189 Other specified counseling: Secondary | ICD-10-CM

## 2018-03-24 LAB — LIPID PANEL
Cholesterol: 187 mg/dL (ref 0–200)
HDL: 48 mg/dL (ref >39.00)
LDL Cholesterol: 124 mg/dL — ABNORMAL HIGH (ref 0–99)
NonHDL: 138.78
Total CHOL/HDL Ratio: 4
Triglycerides: 73 mg/dL (ref 0.0–149.0)
VLDL: 14.6 mg/dL (ref 0.0–40.0)

## 2018-03-24 LAB — CBC WITH DIFFERENTIAL/PLATELET
Basophils Absolute: 0.1 10*3/uL (ref 0.0–0.1)
Basophils Relative: 1 % (ref 0.0–3.0)
Eosinophils Absolute: 0.2 10*3/uL (ref 0.0–0.7)
Eosinophils Relative: 3.5 % (ref 0.0–5.0)
HCT: 28 % — ABNORMAL LOW (ref 36.0–46.0)
Hemoglobin: 9.3 g/dL — ABNORMAL LOW (ref 12.0–15.0)
Lymphocytes Relative: 24.8 % (ref 12.0–46.0)
Lymphs Abs: 1.3 10*3/uL (ref 0.7–4.0)
MCHC: 33.1 g/dL (ref 30.0–36.0)
MCV: 86.9 fl (ref 78.0–100.0)
Monocytes Absolute: 0.4 10*3/uL (ref 0.1–1.0)
Monocytes Relative: 8.2 % (ref 3.0–12.0)
Neutro Abs: 3.4 10*3/uL (ref 1.4–7.7)
Neutrophils Relative %: 62.5 % (ref 43.0–77.0)
Platelets: 245 10*3/uL (ref 150.0–400.0)
RBC: 3.22 Mil/uL — ABNORMAL LOW (ref 3.87–5.11)
RDW: 15.3 % (ref 11.5–15.5)
WBC: 5.4 10*3/uL (ref 4.0–10.5)

## 2018-03-24 LAB — COMPREHENSIVE METABOLIC PANEL
ALT: 8 U/L (ref 0–35)
AST: 26 U/L (ref 0–37)
Albumin: 3.9 g/dL (ref 3.5–5.2)
Alkaline Phosphatase: 42 U/L (ref 39–117)
BUN: 15 mg/dL (ref 6–23)
CO2: 37 mEq/L — ABNORMAL HIGH (ref 19–32)
Calcium: 9.5 mg/dL (ref 8.4–10.5)
Chloride: 103 mEq/L (ref 96–112)
Creatinine, Ser: 0.79 mg/dL (ref 0.40–1.20)
GFR: 73.49 mL/min (ref >60.00)
Glucose, Bld: 82 mg/dL (ref 70–99)
Potassium: 4 mEq/L (ref 3.5–5.1)
Sodium: 142 mEq/L (ref 135–145)
Total Bilirubin: 0.4 mg/dL (ref 0.2–1.2)
Total Protein: 7.4 g/dL (ref 6.0–8.3)

## 2018-03-24 LAB — TSH: TSH: 0.38 u[IU]/mL (ref 0.35–4.50)

## 2018-03-24 LAB — IBC PANEL
Iron: 29 ug/dL — ABNORMAL LOW (ref 42–145)
Saturation Ratios: 6.6 % — ABNORMAL LOW (ref 20.0–50.0)
Transferrin: 315 mg/dL (ref 212.0–360.0)

## 2018-03-24 LAB — VITAMIN D 25 HYDROXY (VIT D DEFICIENCY, FRACTURES): VITD: 36.81 ng/mL (ref 30.00–100.00)

## 2018-03-24 MED ORDER — MONTELUKAST SODIUM 10 MG PO TABS: 10.0000 mg | ORAL_TABLET | Freq: Every day | ORAL | Status: DC | PRN

## 2018-03-24 NOTE — Patient Instructions (Addendum)
Go to the lab on the way out.  We'll contact you with your lab report. Don't change your meds for now.   Try the back brace and see if that helps with the pain in the meantime.  Take care.  Glad to see you.  Update me as needed.

## 2018-03-24 NOTE — Progress Notes (Addendum)
I have personally reviewed the Medicare Annual Wellness questionnaire and have noted 1. The patient's medical and social history 2. Their use of alcohol, tobacco or illicit drugs 3. Their current medications and supplements 4. The patient's functional ability including ADL's, fall risks, home safety risks and hearing or visual             impairment. 5. Diet and physical activities 6. Evidence for depression or mood disorders  The patients weight, height, BMI have been recorded in the chart and visual acuity is per eye clinic.  I have made referrals, counseling and provided education to the patient based review of the above and I have provided the pt with a written personalized care plan for preventive services.  Provider list updated- see scanned forms.  Routine anticipatory guidance given to patient.  See health maintenance. The possibility exists that previously documented standard health maintenance information may have been brought forward from a previous encounter into this note.  If needed, that same information has been updated to reflect the current situation based on today's encounter.    Flu 2019 Shingles 2016 PNA UTD Tetanus 2009 Colonoscopy NA given age.   Breast cancer screening declined by patient.  DXA declined by patient.   Advance directive d/w pt.  if patient were incapacitated, then both daughters would be equally designated.   Cognitive function addressed- see scanned forms- and if abnormal then additional documentation follows.   COPD/chronic resp failure. Still on baseline inhalers and O2.   Compliant. No ADE on med.  She has SOB at baseline.  Some occ cough at baseline.  No sputum usually, only rarely.    Chronic pain from her back.  She is still on gabapentin and cymbalta.  She has a back brace to use.  She can still feel the floor when walking.  No weakness but pain with walking.    Hypothyroidism.  Compliant. No ADE on med.  Labs pending.  No neck mass.     Anemia.  History of.  Due for recheck labs.  See notes on labs.  Hypertension:    Using medication without problems or lightheadedness: yes Chest pain with exertion:no Edema: no recent edema. Only taking K when taking lasix, and no recent use of either.   Short of breath: at baseline.  Labs pending.    Her husband died, condolences offered.  He was on hospice at the end of life.  D/w pt. She has family support. "I'm getting by and I'm not depressed."  She has been praying about her situation and she is trying to process the grief.  She is living at home along and she is tolerating that.  Family is checking on her.  Patient isn't driving.  D/w pt.  Still on cymbalta with prn xanax.  No ADE on med.    She is putting up with a remodel after having her bathroom remodeled after a leak.    PMH and SH reviewed  Meds, vitals, and allergies reviewed.   ROS: Per HPI.  Unless specifically indicated otherwise in HPI, the patient denies:  General: fever. Eyes: acute vision changes ENT: sore throat Cardiovascular: chest pain Respiratory: SOB GI: vomiting GU: dysuria Musculoskeletal: acute back pain Derm: acute rash Neuro: acute motor dysfunction Psych: worsening mood Endocrine: polydipsia Heme: bleeding Allergy: hayfever  GEN: nad, alert and oriented, on O2 at baseline.   HEENT: mucous membranes moist NECK: supple w/o LA CV: rrr. PULM: ctab, no inc wob ABD: soft, +bs EXT: no  edema SKIN: no acute rash

## 2018-03-27 ENCOUNTER — Ambulatory Visit: Payer: Medicare Other | Admitting: Family Medicine

## 2018-03-30 ENCOUNTER — Other Ambulatory Visit: Payer: Self-pay | Admitting: Family Medicine

## 2018-03-30 DIAGNOSIS — D649 Anemia, unspecified: Secondary | ICD-10-CM

## 2018-03-30 NOTE — Assessment & Plan Note (Signed)
She is still on gabapentin and cymbalta.  She has a back brace to use.  She can still feel the floor when walking.  No weakness but pain with walking.   She is putting up with pain as is.  No change in meds at this point.  Reasonable to continue as is.

## 2018-03-30 NOTE — Assessment & Plan Note (Signed)
Still on baseline inhalers and O2.   Compliant. No ADE on meds.  She has SOB at baseline.  Some occ cough at baseline.  No sputum usually, only rarely.  Continue as is.  She agrees.

## 2018-03-30 NOTE — Assessment & Plan Note (Signed)
Reasonable control.  Continue as is.  See notes on labs.

## 2018-03-30 NOTE — Assessment & Plan Note (Signed)
Compliant. No ADE on med.  Labs pending.  No neck mass.

## 2018-03-30 NOTE — Assessment & Plan Note (Signed)
Flu 2019 Shingles 2016 PNA UTD Tetanus 2009 Colonoscopy NA given age.   Breast cancer screening declined by patient.  DXA declined by patient.   Advance directive d/w pt.  if patient were incapacitated, then both daughters would be equally designated.   Cognitive function addressed- see scanned forms- and if abnormal then additional documentation follows.

## 2018-03-30 NOTE — Assessment & Plan Note (Signed)
Advance directive d/w pt.  if patient were incapacitated, then both daughters would be equally designated.

## 2018-03-30 NOTE — Assessment & Plan Note (Signed)
History of.  Due for recheck labs.  See notes on labs.

## 2018-04-04 ENCOUNTER — Telehealth: Payer: Self-pay | Admitting: Family Medicine

## 2018-04-04 NOTE — Telephone Encounter (Signed)
Spoke with patient concerning lab results. 

## 2018-04-04 NOTE — Telephone Encounter (Signed)
Copied from [place] [phone]. Topic: Quick Communication - See Telephone Encounter >> Apr 04, 2018  3:29 PM Mylinda Latina, NT wrote: CRM for notification. See Telephone encounter for: 04/04/18.  Patient called and is inquiring about her lab results. Please call CB# [phone]

## 2018-05-12 ENCOUNTER — Other Ambulatory Visit: Payer: Self-pay | Admitting: Family Medicine

## 2018-05-12 NOTE — Telephone Encounter (Signed)
Electronic refill request. Alprazolam Last office visit:   03/24/18 Last Filled:    30 tablet 2 02/06/2018  Please advise.

## 2018-05-13 NOTE — Telephone Encounter (Signed)
Sent. Thanks.   

## 2018-05-14 ENCOUNTER — Ambulatory Visit: Payer: Medicare Other | Admitting: Family Medicine

## 2018-05-15 ENCOUNTER — Ambulatory Visit: Payer: Medicare Other | Admitting: Family Medicine

## 2018-05-20 ENCOUNTER — Ambulatory Visit: Payer: Medicare Other | Admitting: Family Medicine

## 2018-06-16 ENCOUNTER — Other Ambulatory Visit: Payer: Self-pay | Admitting: Family Medicine

## 2018-06-16 NOTE — Telephone Encounter (Signed)
Name of Medication: Trazodone Name of Pharmacy: [facility] or Written Date and Quantity: 11/15/16 #270/3 Last Office Visit and Type: 03/24/18 Next Office Visit and Type: None scheduled Last Controlled Substance Agreement Date: 04/15/15 Last UDS:04/15/15

## 2018-06-18 NOTE — Telephone Encounter (Signed)
Not controlled med.  Okay to continue.  rx sent.  Thanks.  She had prev cut back to 100mg  qhs, sig adjusted.

## 2018-06-30 ENCOUNTER — Other Ambulatory Visit: Payer: Self-pay | Admitting: Family Medicine

## 2018-08-09 DIAGNOSIS — C50919 Malignant neoplasm of unspecified site of unspecified female breast: Secondary | ICD-10-CM

## 2018-08-09 HISTORY — DX: Malignant neoplasm of unspecified site of unspecified female breast: C50.919

## 2018-08-21 ENCOUNTER — Other Ambulatory Visit: Payer: Self-pay | Admitting: Family Medicine

## 2018-08-22 ENCOUNTER — Encounter: Payer: Self-pay | Admitting: Family Medicine

## 2018-08-22 ENCOUNTER — Ambulatory Visit: Payer: Medicare Other | Admitting: Family Medicine

## 2018-08-22 VITALS — BP 160/70 | HR 81 | Temp 98.2°F | Ht 60.0 in | Wt 148.1 lb

## 2018-08-22 DIAGNOSIS — D649 Anemia, unspecified: Secondary | ICD-10-CM | POA: Diagnosis not present

## 2018-08-22 DIAGNOSIS — R05 Cough: Secondary | ICD-10-CM

## 2018-08-22 DIAGNOSIS — R059 Cough, unspecified: Secondary | ICD-10-CM

## 2018-08-22 DIAGNOSIS — N63 Unspecified lump in unspecified breast: Secondary | ICD-10-CM

## 2018-08-22 MED ORDER — AMOXICILLIN-POT CLAVULANATE 875-125 MG PO TABS: 1.0000 | ORAL_TABLET | Freq: Two times a day (BID) | ORAL | 0 refills | Status: DC

## 2018-08-22 MED ORDER — FERROUS SULFATE 325 (65 FE) MG PO TABS: 325.0000 mg | ORAL_TABLET | Freq: Every day | ORAL | Status: DC

## 2018-08-22 MED ORDER — PREDNISONE 10 MG PO TABS: ORAL_TABLET | ORAL | 0 refills | Status: DC

## 2018-08-22 NOTE — Addendum Note (Signed)
Addended by: Pilar Grammes on: 08/22/2018 04:16 PM   Modules accepted: Orders

## 2018-08-22 NOTE — Progress Notes (Signed)
She is adjusting to living alone, with family support.    History of anemia.  Due for follow-up labs.  Cough more for the last week, maybe two week.  More sputum.  Rhinorrhea, occ bloody.  On O2 at baseline.  Some voice change, hoarse.  No fevers.  No vomiting, no diarrhea.  Still on baseline meds. No ear pain.  More SOB.  She had noted some wheeze.    She had noted a hard knot in her R breast for the last year, at least.  She didn't want intervention previously and had not told anybody about this until today. No nipple discharge.    Meds, vitals, and allergies reviewed.   ROS: Per HPI unless specifically indicated in ROS section   GEN: nad, alert and oriented HEENT: mucous membranes moist, tm w/o erythema, nasal exam w/o erythema, clear discharge noted,  OP with cobblestoning NECK: supple w/o LA CV: rrr.   PULM: ctab, no inc wob EXT: no edema SKIN: well perfused.  On O2 at baseline.   Frontal sinuses minimally ttp.   Chaperoned exam with 5 cm hard knot noted on the lateral side of the right breast, approximately 9:00.  She does have nontender lymphadenopathy in the right axilla.

## 2018-08-22 NOTE — Patient Instructions (Addendum)
Talk to [facility] on the way out today about a mammogram.  We'll contact you with your lab report. Start augmentin.  If you aren't improving in the next few days, then start prednisone with food.  Take care.  Glad to see you.  Update me as needed.

## 2018-08-23 LAB — COMPREHENSIVE METABOLIC PANEL
AG Ratio: 1.4 (calc) (ref 1.0–2.5)
ALT: 8 U/L (ref 6–29)
AST: 25 U/L (ref 10–35)
Albumin: 3.9 g/dL (ref 3.6–5.1)
Alkaline phosphatase (APISO): 42 U/L (ref 37–153)
BUN: 13 mg/dL (ref 7–25)
CO2: 34 mmol/L — ABNORMAL HIGH (ref 20–32)
Calcium: 9 mg/dL (ref 8.6–10.4)
Chloride: 101 mmol/L (ref 98–110)
Creat: 0.62 mg/dL (ref 0.60–0.88)
Globulin: 2.8 g/dL (calc) (ref 1.9–3.7)
Glucose, Bld: 93 mg/dL (ref 65–99)
Potassium: 3.6 mmol/L (ref 3.5–5.3)
Sodium: 142 mmol/L (ref 135–146)
Total Bilirubin: 0.6 mg/dL (ref 0.2–1.2)
Total Protein: 6.7 g/dL (ref 6.1–8.1)

## 2018-08-23 LAB — IRON, TOTAL/TOTAL IRON BINDING CAP
%SAT: 12 % (calc) — ABNORMAL LOW (ref 16–45)
Iron: 34 ug/dL — ABNORMAL LOW (ref 45–160)
TIBC: 294 mcg/dL (calc) (ref 250–450)

## 2018-08-23 LAB — CBC WITH DIFFERENTIAL/PLATELET
Absolute Monocytes: 518 cells/uL (ref 200–950)
Basophils Absolute: 38 cells/uL (ref 0–200)
Basophils Relative: 0.5 %
Eosinophils Absolute: 128 cells/uL (ref 15–500)
Eosinophils Relative: 1.7 %
HCT: 33.9 % — ABNORMAL LOW (ref 35.0–45.0)
Hemoglobin: 11.1 g/dL — ABNORMAL LOW (ref 11.7–15.5)
Lymphs Abs: 1463 cells/uL (ref 850–3900)
MCH: 30.8 pg (ref 27.0–33.0)
MCHC: 32.7 g/dL (ref 32.0–36.0)
MCV: 94.2 fL (ref 80.0–100.0)
MPV: 11.1 fL (ref 7.5–12.5)
Monocytes Relative: 6.9 %
Neutro Abs: 5355 cells/uL (ref 1500–7800)
Neutrophils Relative %: 71.4 %
Platelets: 188 10*3/uL (ref 140–400)
RBC: 3.6 10*6/uL — ABNORMAL LOW (ref 3.80–5.10)
RDW: 13 % (ref 11.0–15.0)
Total Lymphocyte: 19.5 %
WBC: 7.5 10*3/uL (ref 3.8–10.8)

## 2018-08-28 DIAGNOSIS — N63 Unspecified lump in unspecified breast: Secondary | ICD-10-CM

## 2018-08-28 HISTORY — DX: Unspecified lump in unspecified breast: N63.0

## 2018-08-28 NOTE — Assessment & Plan Note (Addendum)
We had a long conversation about this.  She had not yet talked with her family about it. I told her that she very likely could have breast cancer.  I cannot say for certain.  She would not be a great surgical candidate.  She had previously decided that she did not want to have intervention.  We talked about options at this point.  It may still be reasonable to get a mammogram done so that she would at least have some more detail about her situation. She agreed with that. We put in the orders for the mammogram.  I will await the follow-up imaging. I encouraged her to at least notify her family about her situation. She was in agreement with the plan.  >40 minutes spent in face to face time with patient, >50% spent in counselling or coordination of care

## 2018-08-28 NOTE — Assessment & Plan Note (Signed)
See notes on labs. 

## 2018-08-28 NOTE — Assessment & Plan Note (Signed)
With frontal sinus tenderness noted. Start augmentin.  If not improving in the next few days, then start prednisone with food.  She agrees to plan.

## 2018-08-29 ENCOUNTER — Other Ambulatory Visit: Payer: Medicare Other

## 2018-09-01 ENCOUNTER — Ambulatory Visit
Admission: RE | Admit: 2018-09-01 | Discharge: 2018-09-01 | Disposition: A | Discharge status: Home / Self Care | Payer: Medicare Other | Source: Ambulatory Visit | Attending: Family Medicine | Admitting: Family Medicine

## 2018-09-01 ENCOUNTER — Other Ambulatory Visit: Payer: Self-pay | Admitting: Family Medicine

## 2018-09-01 DIAGNOSIS — N63 Unspecified lump in unspecified breast: Secondary | ICD-10-CM

## 2018-09-05 ENCOUNTER — Ambulatory Visit
Admission: RE | Admit: 2018-09-05 | Discharge: 2018-09-05 | Disposition: A | Discharge status: Home / Self Care | Payer: Medicare Other | Source: Ambulatory Visit | Attending: Family Medicine | Admitting: Family Medicine

## 2018-09-05 ENCOUNTER — Other Ambulatory Visit: Payer: Self-pay | Admitting: Family Medicine

## 2018-09-05 DIAGNOSIS — N63 Unspecified lump in unspecified breast: Secondary | ICD-10-CM

## 2018-09-19 ENCOUNTER — Telehealth: Payer: Self-pay | Admitting: Family Medicine

## 2018-09-19 NOTE — Telephone Encounter (Signed)
Please call her.   I think she should try to keep the appointment unless the cancer center directs her to defer the appointment.   Thanks.

## 2018-09-19 NOTE — Telephone Encounter (Signed)
Patient advised.

## 2018-09-19 NOTE — Telephone Encounter (Signed)
Best number [phone]  Pt called wanting a return call  Regarding  Pt has breast cancer she wants to know what her next steps are. She is supposed to go Friday to the cancer center.  She is stated she doesn't think she needs to go because of the coronavairus

## 2018-09-24 ENCOUNTER — Telehealth: Payer: Self-pay | Admitting: Family Medicine

## 2018-09-24 NOTE — Progress Notes (Signed)
[place]  [phone]) [phone] [phone]    The patient called and canceled the appointment on 09/26/2018 stating she would later call and reschedule  ID: [name] DOB: 1930/09/14  MR#: [id number]  LSL#:[id number]  Patient Care Team: Tonia Ghent, MD as PCP - General Joya Gaskins Burnett Harry, MD (Pulmonary Disease) Amad Mau, Virgie Dad, MD as Consulting Physician (Oncology) [name], MD as Consulting Physician (Otolaryngology) Marshell Garfinkel, MD as Consulting Physician (Pulmonary Disease) OTHER MD:   CHIEF COMPLAINT: Newly diagnosed estrogen receptor positive breast cancer  CURRENT TREATMENT: To be discussed   HISTORY OF CURRENT ILLNESS: [name] had a palpable lump in the upper outer right breast as well as another palpable lump in the right axilla that has been present for "some time." [name]'s last mammogram was approximately in 2000. She then underwent bilateral diagnostic mammography with tomography and bilateral breast ultrasonography at [facility] on 09/01/2018 showing: Breast Density Category B. There is an irregular mass with associated architectural distortion in the upper outer right breast, with ill-defined margins. One or 2 possible satellite masses are seen along the medial, more central margin of the dominant mass. There are associated punctate and dystrophic appearing calcifications. In the right axilla, there are 2 enlarged lymph nodes.On physical exam, there is a firm irregular mass in the upper outer right breast. There is also a smooth round easily palpable mass in the right axilla. Targeted of the right breast ultrasound is performed, showing an irregular hypoechoic mass partly ill-defined margins, centered at 11 o'clock, 4 cm the nipple, measuring 4.2 x 1.9 x 4.2 cm. Mass has internal blood flow on color Doppler analysis. No separate satellite mass is seen. Sonographic evaluation of the right axilla shows 2 enlarged and 1  normal size, but abnormal morphology, lymph nodes. The palpable enlarged lymph node measures 2.9 x 2.0 x 3.3 cm. There is an adjacent, deeper, enlarged lymph node measuring 3.1 x 1.2 x 2.8 cm. The normal sized, but abnormal morphology, round node measures 1.1 x 0.8 x 0.9 cm, with no hilar fat.   On the left, there is a lucent round circumscribed mass in the upper outer quadrant. No other masses, no architectural distortion and no suspicious calcifications. Targeted left breast ultrasound is performed, showing a cyst at 12:30 o'clock, 3 cm the nipple, middle depth, measuring 0.7 x 0.5 x 0.6 cm, corresponding to the mammographic mass. This is consistent with a fat necrosis oil cyst.  Accordingly on 09/05/2018 the patient proceeded to biopsy of the right breast area in question. The pathology from this procedure showed ([id number]): invasive ductal carcinoma, grade II/III, 11 o'clock, 4 cm from the nipple. Prognostic indicators significant for: estrogen receptor, 95% positive with strong staining intensity and progesterone receptor, 0% negative. Proliferation marker Ki67 at 15%. HER2 equivocal (2+) by immunohistochemistry but negative by fluorescent in situ hybridization with a signals ratio 1.16 and number per cell 1.80.   An additional biopsy was performed on the right axilla on the same day ([id number]) showing:  2. Lymph node, needle/core biopsy , right axilla - Carcinoma consistent with metastatic carcinoma - Minimal residual lymphoid tissue  The patient's subsequent history is as detailed below.   INTERVAL HISTORY: [name]    REVIEW OF SYSTEMS: [name]    PAST MEDICAL HISTORY: Past Medical History:  Diagnosis Date   Allergic rhinitis, cause unspecified    Allergy, unspecified not elsewhere classified    Anxiety    Asthma    Chronic  kidney disease    frequency   COPD (chronic obstructive pulmonary disease) (HCC)    Degeneration of intervertebral disc, site unspecified     Diverticulosis of colon (without mention of hemorrhage)    Esophageal reflux    Headache    Insomnia, unspecified    Myalgia and myositis, unspecified    Need for prophylactic hormone replacement therapy (postmenopausal)    Osteoarthritis of right knee 08/27/2011   R knee pain, s/p arthroscopy 2012 per [name]    Osteoporosis, unspecified    Other abnormal blood chemistry    Other and unspecified hyperlipidemia    Other chronic pain    Stroke ([place])    mini strokes   Unspecified essential hypertension    Unspecified hypothyroidism      PAST SURGICAL HISTORY: Past Surgical History:  Procedure Laterality Date   BACK SURGERY     breast cystectomy     JOINT REPLACEMENT     shoulder right   KNEE ARTHROSCOPY     Right knee 2012   MASS EXCISION Left 04/07/2015   Procedure: MINOR EXCISION OF MASS LEFT SMALL FINGER;  Surgeon: Leanora Cover, MD;  Location: [facility];  Service: Orthopedics;  Laterality: Left;   SHOULDER SURGERY     TOTAL KNEE ARTHROPLASTY  12/11/2011   Procedure: TOTAL KNEE ARTHROPLASTY;  Surgeon: Johnny Bridge, MD;  Location: [facility];  Service: Orthopedics;  Laterality: Right;   UMBILICAL HERNIA REPAIR N/A 06/09/2013   Procedure: EXPLORATIORY LAPAROTOMY, HERNIA REPAIR UMBILICAL, INSERTION OF MESH;  Surgeon: Haywood Lasso, MD;  Location: [facility];  Service: General;  Laterality: N/A;     FAMILY HISTORY: Family History  Problem Relation Age of Onset   Lung cancer Brother        x2   Hypertension Father    Heart disease Father    Heart disease Mother    Asthma Sister      GYNECOLOGIC HISTORY:  No LMP recorded. Patient is postmenopausal. Menarche:  years old Age at first live birth:  years old [place] P:  LMP:  Contraceptive:  HRT:  Hysterectomy?:  BSO?:    SOCIAL HISTORY: (Current as of 09/26/2018) Selin is a     ADVANCED DIRECTIVES:     HEALTH MAINTENANCE: Social History   Tobacco Use   Smoking status:  Former Smoker    Packs/day: 1.50    Years: 10.00    Pack years: 15.00    Types: Cigarettes    Last attempt to quit: 07/09/1985    Years since quitting: 33.2   Smokeless tobacco: Never Used   Tobacco comment: 1 1/2 ppd x 20 years  Substance Use Topics   Alcohol use: No    Alcohol/week: 0.0 standard drinks   Drug use: No    Colonoscopy:   PAP:   Bone density:  Mammography:   Allergies  Allergen Reactions   Doxycycline     GI upset   Sulfadiazine     REACTION: Fever, achey   Sulfa Antibiotics Rash    Current Outpatient Medications  Medication Sig Dispense Refill   acetaminophen (TYLENOL) 500 MG tablet Take 500-1,000 mg by mouth every 8 (eight) hours as needed.     ALPRAZolam (XANAX) 1 MG tablet TAKE 1/2 TABLET BY MOUTH 2 TIMES DAILY AS NEEDED FOR ANXIETY. 30 tablet 2   amLODipine (NORVASC) 2.5 MG tablet TAKE 1 TABLET BY MOUTH DAILY. 90 tablet 3   amoxicillin-clavulanate (AUGMENTIN) 875-125 MG tablet Take 1 tablet by mouth 2 (two)  times daily. 20 tablet 0   Calcium Carbonate (CALTRATE 600 PO) Take 1 tablet by mouth daily.       cholecalciferol (VITAMIN D) 1000 units tablet Take 1 tablet (1,000 Units total) by mouth daily.     diclofenac sodium (VOLTAREN) 1 % GEL APPLY 4 GRAMS TO AFFECTED AREA 2 TIMES A DAY. 200 g 3   DULoxetine (CYMBALTA) 60 MG capsule TAKE 1 CAPSULE BY MOUTH DAILY 90 capsule 2   ferrous sulfate (FERROUSUL) 325 (65 FE) MG tablet Take 1 tablet (325 mg total) by mouth daily with breakfast.     fluticasone (FLONASE) 50 MCG/ACT nasal spray Place 1-2 sprays into both nostrils daily as needed for allergies.     furosemide (LASIX) 20 MG tablet TAKE 1/2 TO 1 TABLET BY MOUTH EVERY MORNING AS NEEDED FOR SWELLING. USE SPARINGLY.  Hold it for one week from now. (Patient taking differently: TAKE 1/2 TO 1 TABLET BY MOUTH EVERY MORNING AS NEEDED FOR SWELLING. USE SPARINGLY.) 30 tablet 0   gabapentin (NEURONTIN) 300 MG capsule 2 tabs in the AM, 3 tabs in the  PM.  Can take an extra tab midday if needed/tolerated. 180 capsule 3   ibuprofen (ADVIL,MOTRIN) 200 MG tablet Take 3 tablets (600 mg total) by mouth 2 (two) times daily as needed for mild pain. With food     levothyroxine (SYNTHROID, LEVOTHROID) 112 MCG tablet TAKE 1 TABLET BY MOUTH DAILY BEFORE BREAKFAST. 90 tablet 1   meclizine (ANTIVERT) 25 MG tablet TAKE 1 TABLET BY MOUTH 3 TIMES DAILY AS NEEDED FOR DIZZINESS. 30 tablet 1   NON FORMULARY Place 2.5-3 L into the nose daily. OXYGEN     potassium chloride (K-DUR) 10 MEQ tablet Take 1 tablet (10 mEq total) by mouth daily as needed (If taking lasix/furosemide).     predniSONE (DELTASONE) 10 MG tablet Take 2 a day for 5 days, then 1 a day for 5 days, with food. Don't take with aleve/ibuprofen. 15 tablet 0   tiotropium (SPIRIVA HANDIHALER) 18 MCG inhalation capsule PLACE 1 CAPSULE INTO INHALER AND INHALE ONCE A DAY 30 capsule 12   traZODone (DESYREL) 50 MG tablet TAKE 2 TABLETS (100 MG TOTAL) BY MOUTH AT BEDTIME     No current facility-administered medications for this visit.      OBJECTIVE:  There were no vitals filed for this visit.   There is no height or weight on file to calculate BMI.   Wt Readings from Last 3 Encounters:  08/22/18 148 lb 1 oz (67.2 kg)  03/24/18 149 lb 4 oz (67.7 kg)  09/19/17 148 lb 4 oz (67.2 kg)      LAB RESULTS:  CMP     Component Value Date/Time   NA 142 08/22/2018 1625   K 3.6 08/22/2018 1625   CL 101 08/22/2018 1625   CO2 34 (H) 08/22/2018 1625   GLUCOSE 93 08/22/2018 1625   BUN 13 08/22/2018 1625   CREATININE 0.62 08/22/2018 1625   CALCIUM 9.0 08/22/2018 1625   PROT 6.7 08/22/2018 1625   ALBUMIN 3.9 03/24/2018 1155   AST 25 08/22/2018 1625   ALT 8 08/22/2018 1625   ALKPHOS 42 03/24/2018 1155   BILITOT 0.6 08/22/2018 1625   GFRNONAA >60 04/07/2017 0444   GFRAA >60 04/07/2017 0444    No results found for: TOTALPROTELP, ALBUMINELP, A1GS, A2GS, BETS, BETA2SER, GAMS, MSPIKE, SPEI  No  results found for: KPAFRELGTCHN, LAMBDASER, KAPLAMBRATIO  Lab Results  Component Value Date   WBC 7.5 08/22/2018  NEUTROABS 5,355 08/22/2018   HGB 11.1 (L) 08/22/2018   HCT 33.9 (L) 08/22/2018   MCV 94.2 08/22/2018   PLT 188 08/22/2018    _0 @  No results found for: LABCA2  No components found for: ONGEXB284  No results for input(s): INR in the last 168 hours.  No results found for: LABCA2  No results found for: XLK440  No results found for: NUU725  No results found for: DGU440  No results found for: CA2729  No components found for: HGQUANT  No results found for: CEA1 / No results found for: CEA1   No results found for: AFPTUMOR  No results found for: CHROMOGRNA  No results found for: PSA1  No visits with results within 3 Day(s) from this visit.  Latest known visit with results is:  Office Visit on 08/22/2018  Component Date Value Ref Range Status   Glucose, Bld 08/22/2018 93  65 - 99 mg/dL Final   Comment: .            Fasting reference interval .    BUN 08/22/2018 13  7 - 25 mg/dL Final   Creat 08/22/2018 0.62  0.60 - 0.88 mg/dL Final   Comment: For patients >20 years of age, the reference limit for Creatinine is approximately 13% higher for people identified as African-American. .    BUN/Creatinine Ratio 34/74/2595 NOT APPLICABLE  6 - 22 (calc) Final   Sodium 08/22/2018 142  135 - 146 mmol/L Final   Potassium 08/22/2018 3.6  3.5 - 5.3 mmol/L Final   Chloride 08/22/2018 101  98 - 110 mmol/L Final   CO2 08/22/2018 34* 20 - 32 mmol/L Final   Calcium 08/22/2018 9.0  8.6 - 10.4 mg/dL Final   Total Protein 08/22/2018 6.7  6.1 - 8.1 g/dL Final   Albumin 08/22/2018 3.9  3.6 - 5.1 g/dL Final   Globulin 08/22/2018 2.8  1.9 - 3.7 g/dL (calc) Final   AG Ratio 08/22/2018 1.4  1.0 - 2.5 (calc) Final   Total Bilirubin 08/22/2018 0.6  0.2 - 1.2 mg/dL Final   Alkaline phosphatase (APISO) 08/22/2018 42  37 - 153 U/L Final   AST  08/22/2018 25  10 - 35 U/L Final   ALT 08/22/2018 8  6 - 29 U/L Final   WBC 08/22/2018 7.5  3.8 - 10.8 Thousand/uL Final   RBC 08/22/2018 3.60* 3.80 - 5.10 Million/uL Final   Hemoglobin 08/22/2018 11.1* 11.7 - 15.5 g/dL Final   HCT 08/22/2018 33.9* 35.0 - 45.0 % Final   MCV 08/22/2018 94.2  80.0 - 100.0 fL Final   MCH 08/22/2018 30.8  27.0 - 33.0 pg Final   MCHC 08/22/2018 32.7  32.0 - 36.0 g/dL Final   RDW 08/22/2018 13.0  11.0 - 15.0 % Final   Platelets 08/22/2018 188  140 - 400 Thousand/uL Final   MPV 08/22/2018 11.1  7.5 - 12.5 fL Final   Neutro Abs 08/22/2018 5,355  1,500 - 7,800 cells/uL Final   Lymphs Abs 08/22/2018 1,463  850 - 3,900 cells/uL Final   Absolute Monocytes 08/22/2018 518  200 - 950 cells/uL Final   Eosinophils Absolute 08/22/2018 128  15 - 500 cells/uL Final   Basophils Absolute 08/22/2018 38  0 - 200 cells/uL Final   Neutrophils Relative % 08/22/2018 71.4  % Final   Total Lymphocyte 08/22/2018 19.5  % Final   Monocytes Relative 08/22/2018 6.9  % Final   Eosinophils Relative 08/22/2018 1.7  % Final   Basophils Relative 08/22/2018 0.5  %  Final   Iron 08/22/2018 34* 45 - 160 mcg/dL Final   TIBC 08/22/2018 294  250 - 450 mcg/dL (calc) Final   %SAT 08/22/2018 12* 16 - 45 % (calc) Final    (this displays the last labs from the last 3 days)  No results found for: TOTALPROTELP, ALBUMINELP, A1GS, A2GS, BETS, BETA2SER, GAMS, MSPIKE, SPEI (this displays SPEP labs)  No results found for: KPAFRELGTCHN, LAMBDASER, KAPLAMBRATIO (kappa/lambda light chains)  No results found for: HGBA, HGBA2QUANT, HGBFQUANT, HGBSQUAN (Hemoglobinopathy evaluation)   No results found for: LDH  Lab Results  Component Value Date   IRON 34 (L) 08/22/2018   TIBC 294 08/22/2018   IRONPCTSAT 12 (L) 08/22/2018   (Iron and TIBC)  No results found for: FERRITIN  Urinalysis    Component Value Date/Time   COLORURINE YELLOW 01/24/2017 0020   APPEARANCEUR HAZY (A)  01/24/2017 0020   LABSPEC 1.009 01/24/2017 0020   PHURINE 7.0 01/24/2017 0020   GLUCOSEU NEGATIVE 01/24/2017 0020   GLUCOSEU NEGATIVE 10/24/2016 1429   HGBUR NEGATIVE 01/24/2017 0020   BILIRUBINUR NEGATIVE 01/24/2017 0020   BILIRUBINUR negative 09/20/2016 1625   KETONESUR NEGATIVE 01/24/2017 0020   PROTEINUR NEGATIVE 01/24/2017 0020   UROBILINOGEN 0.2 10/24/2016 1429   NITRITE NEGATIVE 01/24/2017 0020   LEUKOCYTESUR LARGE (A) 01/24/2017 0020     STUDIES:  US Breast Ltd Uni Left Inc Axilla  Addendum Date: 09/01/2018   ADDENDUM REPORT: 09/01/2018 14:57 ADDENDUM: There is an error in the original report. In the comparison section, this should read none. There were no prior studies available. Electronically Signed   By: Lajean Manes M.D.   On: 09/01/2018 14:57   Result Date: 09/01/2018 CLINICAL DATA:  Patient presents with a palpable lump in the upper outer right breast as well as another palpable lump in the right axilla. Lump has been present for "some time". Last mammogram was approximately 20 years ago. EXAM: DIGITAL DIAGNOSTIC BILATERAL MAMMOGRAM WITH CAD AND TOMO ULTRASOUND BILATERAL BREAST COMPARISON:  Previous exam(s). ACR Breast Density Category b: There are scattered areas of fibroglandular density. FINDINGS: There is an irregular mass with associated architectural distortion in the upper outer right breast, with ill-defined margins. One or 2 possible satellite masses are seen along the medial, more central margin of the dominant mass. There are associated punctate and dystrophic appearing calcifications. In the right axilla, there are 2 enlarged lymph nodes. On the left, there is a lucent round circumscribed mass in the upper outer quadrant. No other masses, no architectural distortion and no suspicious calcifications. Mammographic images were processed with CAD. On physical exam, there is a firm irregular mass in the upper outer right breast. There is also a smooth round easily palpable  mass in the right axilla. Targeted of the right breast ultrasound is performed, showing an irregular hypoechoic mass partly ill-defined margins, centered at 11 o'clock, 4 cm the nipple, measuring 4.2 x 1.9 x 4.2 cm. Mass has internal blood flow on color Doppler analysis. No separate satellite mass is seen. Sonographic evaluation of the right axilla shows 2 enlarged and 1 normal size, but abnormal morphology, lymph nodes. The palpable enlarged lymph node measures 2.9 x 2.0 x 3.3 cm. There is an adjacent, deeper, enlarged lymph node measuring 3.1 x 1.2 x 2.8 cm. The normal sized, but abnormal morphology, round node measures 11 x 8 x 9 mm, with no hilar fat. Targeted left breast ultrasound is performed, showing a cyst at 12:30 o'clock, 3 cm the nipple, middle depth, measuring  7 x 5 x 6 mm, corresponding to the mammographic mass. This is consistent with a fat necrosis oil cyst. IMPRESSION: 1. Large right breast mass, 4.2 cm, associated with 2 enlarged and another morphologically abnormal right axillary lymph node, all highly suspicious for breast carcinoma with metastatic right axillary lymphadenopathy. 2. Benign oil cyst in the left breast. No evidence of left breast malignancy. RECOMMENDATION: 1. Ultrasound-guided core needle biopsy of the right breast mass and the largest right axillary lymph node, which is the palpable node. Patient was scheduled for the right breast and axillary lymph node biopsies prior to leaving the breast Center. I have discussed the findings and recommendations with the patient. Results were also provided in writing at the conclusion of the visit. If applicable, a reminder letter will be sent to the patient regarding the next appointment. BI-RADS CATEGORY  5: Highly suggestive of malignancy. Electronically Signed: By: Lajean Manes M.D. On: 09/01/2018 11:57   US Breast Ltd Uni Right Inc Axilla  Addendum Date: 09/01/2018   ADDENDUM REPORT: 09/01/2018 14:57 ADDENDUM: There is an error in the  original report. In the comparison section, this should read none. There were no prior studies available. Electronically Signed   By: Lajean Manes M.D.   On: 09/01/2018 14:57   Result Date: 09/01/2018 CLINICAL DATA:  Patient presents with a palpable lump in the upper outer right breast as well as another palpable lump in the right axilla. Lump has been present for "some time". Last mammogram was approximately 20 years ago. EXAM: DIGITAL DIAGNOSTIC BILATERAL MAMMOGRAM WITH CAD AND TOMO ULTRASOUND BILATERAL BREAST COMPARISON:  Previous exam(s). ACR Breast Density Category b: There are scattered areas of fibroglandular density. FINDINGS: There is an irregular mass with associated architectural distortion in the upper outer right breast, with ill-defined margins. One or 2 possible satellite masses are seen along the medial, more central margin of the dominant mass. There are associated punctate and dystrophic appearing calcifications. In the right axilla, there are 2 enlarged lymph nodes. On the left, there is a lucent round circumscribed mass in the upper outer quadrant. No other masses, no architectural distortion and no suspicious calcifications. Mammographic images were processed with CAD. On physical exam, there is a firm irregular mass in the upper outer right breast. There is also a smooth round easily palpable mass in the right axilla. Targeted of the right breast ultrasound is performed, showing an irregular hypoechoic mass partly ill-defined margins, centered at 11 o'clock, 4 cm the nipple, measuring 4.2 x 1.9 x 4.2 cm. Mass has internal blood flow on color Doppler analysis. No separate satellite mass is seen. Sonographic evaluation of the right axilla shows 2 enlarged and 1 normal size, but abnormal morphology, lymph nodes. The palpable enlarged lymph node measures 2.9 x 2.0 x 3.3 cm. There is an adjacent, deeper, enlarged lymph node measuring 3.1 x 1.2 x 2.8 cm. The normal sized, but abnormal morphology,  round node measures 11 x 8 x 9 mm, with no hilar fat. Targeted left breast ultrasound is performed, showing a cyst at 12:30 o'clock, 3 cm the nipple, middle depth, measuring 7 x 5 x 6 mm, corresponding to the mammographic mass. This is consistent with a fat necrosis oil cyst. IMPRESSION: 1. Large right breast mass, 4.2 cm, associated with 2 enlarged and another morphologically abnormal right axillary lymph node, all highly suspicious for breast carcinoma with metastatic right axillary lymphadenopathy. 2. Benign oil cyst in the left breast. No evidence of left breast malignancy. RECOMMENDATION:  1. Ultrasound-guided core needle biopsy of the right breast mass and the largest right axillary lymph node, which is the palpable node. Patient was scheduled for the right breast and axillary lymph node biopsies prior to leaving the breast Center. I have discussed the findings and recommendations with the patient. Results were also provided in writing at the conclusion of the visit. If applicable, a reminder letter will be sent to the patient regarding the next appointment. BI-RADS CATEGORY  5: Highly suggestive of malignancy. Electronically Signed: By: Lajean Manes M.D. On: 09/01/2018 11:57   Mm Diag Breast Tomo Bilateral  Addendum Date: 09/01/2018   ADDENDUM REPORT: 09/01/2018 14:57 ADDENDUM: There is an error in the original report. In the comparison section, this should read none. There were no prior studies available. Electronically Signed   By: Lajean Manes M.D.   On: 09/01/2018 14:57   Result Date: 09/01/2018 CLINICAL DATA:  Patient presents with a palpable lump in the upper outer right breast as well as another palpable lump in the right axilla. Lump has been present for "some time". Last mammogram was approximately 20 years ago. EXAM: DIGITAL DIAGNOSTIC BILATERAL MAMMOGRAM WITH CAD AND TOMO ULTRASOUND BILATERAL BREAST COMPARISON:  Previous exam(s). ACR Breast Density Category b: There are scattered areas of  fibroglandular density. FINDINGS: There is an irregular mass with associated architectural distortion in the upper outer right breast, with ill-defined margins. One or 2 possible satellite masses are seen along the medial, more central margin of the dominant mass. There are associated punctate and dystrophic appearing calcifications. In the right axilla, there are 2 enlarged lymph nodes. On the left, there is a lucent round circumscribed mass in the upper outer quadrant. No other masses, no architectural distortion and no suspicious calcifications. Mammographic images were processed with CAD. On physical exam, there is a firm irregular mass in the upper outer right breast. There is also a smooth round easily palpable mass in the right axilla. Targeted of the right breast ultrasound is performed, showing an irregular hypoechoic mass partly ill-defined margins, centered at 11 o'clock, 4 cm the nipple, measuring 4.2 x 1.9 x 4.2 cm. Mass has internal blood flow on color Doppler analysis. No separate satellite mass is seen. Sonographic evaluation of the right axilla shows 2 enlarged and 1 normal size, but abnormal morphology, lymph nodes. The palpable enlarged lymph node measures 2.9 x 2.0 x 3.3 cm. There is an adjacent, deeper, enlarged lymph node measuring 3.1 x 1.2 x 2.8 cm. The normal sized, but abnormal morphology, round node measures 11 x 8 x 9 mm, with no hilar fat. Targeted left breast ultrasound is performed, showing a cyst at 12:30 o'clock, 3 cm the nipple, middle depth, measuring 7 x 5 x 6 mm, corresponding to the mammographic mass. This is consistent with a fat necrosis oil cyst. IMPRESSION: 1. Large right breast mass, 4.2 cm, associated with 2 enlarged and another morphologically abnormal right axillary lymph node, all highly suspicious for breast carcinoma with metastatic right axillary lymphadenopathy. 2. Benign oil cyst in the left breast. No evidence of left breast malignancy. RECOMMENDATION: 1.  Ultrasound-guided core needle biopsy of the right breast mass and the largest right axillary lymph node, which is the palpable node. Patient was scheduled for the right breast and axillary lymph node biopsies prior to leaving the breast Center. I have discussed the findings and recommendations with the patient. Results were also provided in writing at the conclusion of the visit. If applicable, a reminder letter will  be sent to the patient regarding the next appointment. BI-RADS CATEGORY  5: Highly suggestive of malignancy. Electronically Signed: By: Lajean Manes M.D. On: 09/01/2018 11:57   [place] Axillary Node Core Biopsy Right  Addendum Date: 09/09/2018   ADDENDUM REPORT: 09/09/2018 07:17 ADDENDUM: Pathology revealed GRADE II/III INVASIVE DUCTAL CARCINOMA of the Right breast, 11 o'clock, 4cmfn. CARCINOMA CONSISTENT WITH METASTATIC CARCINOMA, MINIMAL RESIDUAL LYMPHOID TISSUE of the Right axilla. This was found to be concordant by Dr. Curlene Dolphin. Pathology results were discussed with the patient and her daughter, Tresa Res, by telephone. The patient reported doing well after the biopsies with tenderness at the sites. Post biopsy instructions and care were reviewed and questions were answered. The patient was encouraged to call [facility] for any additional concerns. A medical oncology referral was arranged with Dr. Tressa Busman, per patient request, on September 26, 2018. Pathology results reported by Terie Purser, RN on 09/09/2018. Electronically Signed   By: Curlene Dolphin M.D.   On: 09/09/2018 07:17   Result Date: 09/09/2018 CLINICAL DATA:  Ultrasound-guided core needle biopsy was recommended a palpable suspicious right axillary lymph node. EXAM: [place] AXILLARY NODE CORE BIOPSY RIGHT COMPARISON:  Previous exam(s). FINDINGS: I met with the patient and we discussed the procedure of ultrasound-guided biopsy, including benefits and alternatives. We discussed the high likelihood of a  successful procedure. We discussed the risks of the procedure, including infection, bleeding, tissue injury, clip migration, and inadequate sampling. Informed written consent was given. The usual time-out protocol was performed immediately prior to the procedure. Using sterile technique and 1% Lidocaine as local anesthetic, under direct ultrasound visualization, a 14 gauge spring-loaded device was used to perform biopsy of an enlarged hypoechoic palpable right axillary lymph node using a lateral approach. At the conclusion of the procedure a spiral HydroMARK tissue marker clip was deployed into the biopsy cavity. Follow up 2 view mammogram was performed and dictated separately. IMPRESSION: Ultrasound guided biopsy of right axillary lymph node. No apparent complications. Electronically Signed: By: Curlene Dolphin M.D. On: 09/05/2018 13:56   Mm Clip Placement Right  Result Date: 09/05/2018 CLINICAL DATA:  Ultrasound-guided core needle biopsy was performed of a suspicious palpable right breast mass and a suspicious palpable right axillary lymph node. EXAM: DIAGNOSTIC RIGHT MAMMOGRAM POST ULTRASOUND BIOPSY COMPARISON:  Previous exam(s). FINDINGS: Mammographic images were obtained following ultrasound guided biopsy of a right breast mass at 11 o'clock position and a palpable right axillary lymph node. Ribbon shaped biopsy clip is satisfactorily positioned within the suspicious mass. The palpable lymph node that was biopsied today is not included on the post clip mammogram. Of note, the [facility] biopsy clip could be visualized within the biopsied axillary lymph node on today's biopsy ultrasound images. IMPRESSION: Satisfactory position of ribbon shaped biopsy clip in the biopsied mass. The biopsied right axillary lymph node is not included on the mammogram image. Final Assessment: Post Procedure Mammograms for Marker Placement Electronically Signed   By: Curlene Dolphin M.D.   On: 09/05/2018 13:58   [place] Rt Breast Bx W  Loc Dev 1st Lesion Img Bx Spec US Guide  Addendum Date: 09/09/2018   ADDENDUM REPORT: 09/09/2018 07:17 ADDENDUM: Pathology revealed GRADE II/III INVASIVE DUCTAL CARCINOMA of the Right breast, 11 o'clock, 4cmfn. CARCINOMA CONSISTENT WITH METASTATIC CARCINOMA, MINIMAL RESIDUAL LYMPHOID TISSUE of the Right axilla. This was found to be concordant by Dr. Curlene Dolphin. Pathology results were discussed with the patient and her daughter, Tresa Res, by telephone. The  patient reported doing well after the biopsies with tenderness at the sites. Post biopsy instructions and care were reviewed and questions were answered. The patient was encouraged to call [facility] for any additional concerns. A medical oncology referral was arranged with Dr. Tressa Busman, per patient request, on September 26, 2018. Pathology results reported by Terie Purser, RN on 09/09/2018. Electronically Signed   By: Curlene Dolphin M.D.   On: 09/09/2018 07:17   Result Date: 09/09/2018 CLINICAL DATA:  85 year old patient with suspicious palpable right breast mass 11 o'clock position presents for biopsy. Today, a palpable right axillary lymph node is also biopsied, and dictated separately. EXAM: ULTRASOUND GUIDED RIGHT BREAST CORE NEEDLE BIOPSY COMPARISON:  Previous exam(s). FINDINGS: I met with the patient and we discussed the procedure of ultrasound-guided biopsy, including benefits and alternatives. We discussed the high likelihood of a successful procedure. We discussed the risks of the procedure, including infection, bleeding, tissue injury, clip migration, and inadequate sampling. Informed written consent was given. The usual time-out protocol was performed immediately prior to the procedure. Lesion quadrant: Upper outer quadrant Using sterile technique and 1% Lidocaine as local anesthetic, under direct ultrasound visualization, a 12 gauge spring-loaded device was used to perform biopsy of a suspicious palpable mass 11  o'clock position right breast using a lateral approach. At the conclusion of the procedure a ribbon tissue marker clip was deployed into the biopsy cavity. Follow up 2 view mammogram was performed and dictated separately. IMPRESSION: Ultrasound guided biopsy of the right breast. No apparent complications. Electronically Signed: By: Curlene Dolphin M.D. On: 09/05/2018 13:53     ELIGIBLE FOR AVAILABLE RESEARCH PROTOCOL:    ASSESSMENT: 85 y.o. [place], [place] woman status post right breast upper outer quadrant biopsy 09/05/2018 for a clinical T2 N1, stage IIB invasive ductal carcinoma, grade 2, estrogen receptor positive, progesterone receptor negative, HER-2 not amplified, with an MIB-1 of 15%.   PLAN: Johna Roles, Virgie Dad, MD  09/26/18 4:53 PM Medical Oncology and Hematology [facility] [address] [place], [place] [address] Tel. [phone]    Fax. [phone]   I, Jacqualyn Posey am acting as a Education administrator for Chauncey Cruel, MD.

## 2018-09-25 ENCOUNTER — Other Ambulatory Visit: Payer: Self-pay

## 2018-09-25 DIAGNOSIS — N63 Unspecified lump in unspecified breast: Secondary | ICD-10-CM

## 2018-09-25 NOTE — Telephone Encounter (Signed)
Electronic refill request.  Alprazolam Last office visit:   08/22/2018 Last Filled:    30 tablet 2 05/13/2018  Please advise.

## 2018-09-26 ENCOUNTER — Telehealth: Payer: Self-pay

## 2018-09-26 ENCOUNTER — Inpatient Hospital Stay: Payer: Medicare Other | Attending: Family Medicine

## 2018-09-26 ENCOUNTER — Encounter: Payer: Self-pay | Admitting: Oncology

## 2018-09-26 ENCOUNTER — Inpatient Hospital Stay: Payer: Medicare Other | Admitting: Oncology

## 2018-09-26 DIAGNOSIS — C50411 Malignant neoplasm of upper-outer quadrant of right female breast: Secondary | ICD-10-CM | POA: Insufficient documentation

## 2018-09-26 DIAGNOSIS — Z170 Estrogen receptor positive status [ER+]: Secondary | ICD-10-CM

## 2018-09-26 NOTE — Telephone Encounter (Signed)
Sent. Thanks.   

## 2018-09-26 NOTE — Telephone Encounter (Signed)
Patient called office to reschedule appointment due to concerns with age related to the COVID-19. Nurse informed patient scheduling team would be notified and we will contact her for re-scheduling.  Pt voiced understanding and agreement.  Scheduling message sent.

## 2018-10-01 ENCOUNTER — Encounter: Payer: Self-pay | Admitting: Oncology

## 2018-10-01 ENCOUNTER — Telehealth: Payer: Self-pay | Admitting: Oncology

## 2018-10-01 NOTE — Telephone Encounter (Signed)
I tried calling the pt to reschedule her appt, but wasn't able to reach her.

## 2018-10-08 ENCOUNTER — Other Ambulatory Visit: Payer: Self-pay | Admitting: Oncology

## 2018-10-09 ENCOUNTER — Encounter: Payer: Self-pay | Admitting: *Deleted

## 2018-10-09 ENCOUNTER — Inpatient Hospital Stay: Payer: Medicare Other | Attending: Oncology | Admitting: Oncology

## 2018-10-09 ENCOUNTER — Other Ambulatory Visit: Payer: Self-pay | Admitting: Oncology

## 2018-10-09 DIAGNOSIS — Z170 Estrogen receptor positive status [ER+]: Secondary | ICD-10-CM

## 2018-10-09 DIAGNOSIS — I712 Thoracic aortic aneurysm, without rupture, unspecified: Secondary | ICD-10-CM

## 2018-10-09 DIAGNOSIS — C50411 Malignant neoplasm of upper-outer quadrant of right female breast: Secondary | ICD-10-CM | POA: Diagnosis not present

## 2018-10-09 DIAGNOSIS — N189 Chronic kidney disease, unspecified: Secondary | ICD-10-CM

## 2018-10-09 DIAGNOSIS — M810 Age-related osteoporosis without current pathological fracture: Secondary | ICD-10-CM

## 2018-10-09 DIAGNOSIS — C773 Secondary and unspecified malignant neoplasm of axilla and upper limb lymph nodes: Secondary | ICD-10-CM

## 2018-10-09 DIAGNOSIS — M797 Fibromyalgia: Secondary | ICD-10-CM

## 2018-10-09 DIAGNOSIS — F411 Generalized anxiety disorder: Secondary | ICD-10-CM

## 2018-10-09 DIAGNOSIS — J449 Chronic obstructive pulmonary disease, unspecified: Secondary | ICD-10-CM

## 2018-10-09 DIAGNOSIS — Z9981 Dependence on supplemental oxygen: Secondary | ICD-10-CM

## 2018-10-09 DIAGNOSIS — Z7189 Other specified counseling: Secondary | ICD-10-CM

## 2018-10-09 DIAGNOSIS — Z79899 Other long term (current) drug therapy: Secondary | ICD-10-CM

## 2018-10-09 DIAGNOSIS — I129 Hypertensive chronic kidney disease with stage 1 through stage 4 chronic kidney disease, or unspecified chronic kidney disease: Secondary | ICD-10-CM

## 2018-10-09 MED ORDER — ANASTROZOLE 1 MG PO TABS: 1.0000 mg | ORAL_TABLET | Freq: Every day | ORAL | 4 refills | Status: DC

## 2018-10-09 NOTE — Progress Notes (Signed)
[place]  [phone] [phone]     ID: [name] DOB: 16-Sep-1930  MR#: [id number]  KGS#:[id number]  Patient Care Team: Tonia Ghent, MD as PCP - General Joya Gaskins Burnett Harry, MD (Pulmonary Disease) Fredrika Canby, Virgie Dad, MD as Consulting Physician (Oncology) Alice Gala, MD as Consulting Physician (Otolaryngology) Marshell Garfinkel, MD as Consulting Physician (Pulmonary Disease) Chauncey Cruel, MD OTHER MD:  This is a telehealth visit taking place on 10/09/18 at 10:14 AM   with the patient [name]  located at her home and agreeing to the visit, and myself as the provider located at the clinic.    CHIEF COMPLAINT: Estrogen receptor positive breast cancer  CURRENT TREATMENT: Anastrozole   HISTORY OF CURRENT ILLNESS: The patient noted a change in the upper outer right breast, as well as a palpable lump in the right axilla, sometime mid-to-late 2019.  She did discuss it briefly with her husband but he was not in good health and she did not want to worry him. She had not had a prior mammogram for better than 2 decades.  More recently she brought this to medical attention and on 09/01/2018 she underwent bilateral diagnostic mammography with tomography and bilateral breast and axillae ultrasonography at the breast center.  This found the breast density to be category B.  In the upper right breast there was an irregular mass with architectural distortion and 2 possible satellite masses.  In the right axilla there were 2 enlarged lymph nodes.  By physical exam there was a firm irregular mass in the upper outer quadrant of the right breast and a smooth easily palpable mass in the right axilla.  Ultrasound of the right breast confirmed a 4.2 cm irregular hypoechoic mass centered at 11:00.  There was no satellite mass seen.  In the right axilla there were 2 enlarged lymph nodes, as well as 1 with normal size but abnormal morphology.  The palpable  enlarged lymph nodes measure 2.9 and 3.1 cm.  The third node measured 0.9 cm but had no hilar fat.  Biopsy of the right breast upper outer quadrant mass on 09/05/2018 showed ([id number]) invasive ductal carcinoma, grade 2, estrogen receptor 95% positive with strong staining intensity, estrogen receptor negative, HER-2 equivocal by immunohistochemistry but negative by FISH with a signals ratio of 1.16 and copy number per cell 1.80.  The MIB-1 was 15%  The right axillary lymph node biopsied was also positive for carcinoma.  On the left side there was a lucent round circumscribed mass in the upper outer quadrant.  By ultrasound this proved to be a fat necrosis oil cyst requiring no further follow-up  The patient's subsequent history is as detailed below.  INTERVAL HISTORY: I met with the patient by WebEx with her daughter [name] also present.  REVIEW OF SYSTEMS: The patient has significant pulmonary disease, and is wearing O2 by nasal cannula.  She tells me she was started on antibiotics about 3 weeks ago by Dr. Damita Dunnings but that she still has something of a cough.  She has significant baseline arthralgias and myalgias and has been evaluated extensively by rheumatology including at [facility], with a diagnosis of chronic fatigue syndrome and fibromyalgia.  The patient is very aware of current pandemic risks and is taking appropriate precautions.  Review of systems today was otherwise noncontributory  PAST MEDICAL HISTORY: Past Medical History:  Diagnosis Date  . Allergic rhinitis, cause unspecified   . Allergy, unspecified not elsewhere classified   .  Anxiety   . Asthma   . Chronic kidney disease    frequency  . COPD (chronic obstructive pulmonary disease) ([place])   . Degeneration of intervertebral disc, site unspecified   . Diverticulosis of colon (without mention of hemorrhage)   . Esophageal reflux   . Headache   . Insomnia, unspecified   . Myalgia and myositis, unspecified   . Need for  prophylactic hormone replacement therapy (postmenopausal)   . Osteoarthritis of right knee 08/27/2011   R knee pain, s/p arthroscopy 2012 per [name]   . Osteoporosis, unspecified   . Other abnormal blood chemistry   . Other and unspecified hyperlipidemia   . Other chronic pain   . Stroke [facility])    mini strokes  . Unspecified essential hypertension   . Unspecified hypothyroidism     PAST SURGICAL HISTORY: Past Surgical History:  Procedure Laterality Date  . BACK SURGERY    . breast cystectomy    . JOINT REPLACEMENT     shoulder right  . KNEE ARTHROSCOPY     Right knee 2012  . MASS EXCISION Left 04/07/2015   Procedure: MINOR EXCISION OF MASS LEFT SMALL FINGER;  Surgeon: Leanora Cover, MD;  Location: [place];  Service: Orthopedics;  Laterality: Left;  . SHOULDER SURGERY    . TOTAL KNEE ARTHROPLASTY  12/11/2011   Procedure: TOTAL KNEE ARTHROPLASTY;  Surgeon: Johnny Bridge, MD;  Location: [place];  Service: Orthopedics;  Laterality: Right;  . UMBILICAL HERNIA REPAIR N/A 06/09/2013   Procedure: EXPLORATIORY LAPAROTOMY, HERNIA REPAIR UMBILICAL, INSERTION OF MESH;  Surgeon: Haywood Lasso, MD;  Location[facility];  Service: General;  Laterality: N/A;    FAMILY HISTORY Family History  Problem Relation Age of Onset  . Lung cancer Brother        x2  . Hypertension Father   . Heart disease Father   . Heart disease Mother   . Asthma Sister   The patient's father died from a heart attack at age 77.  The patient's mother died suddenly at age 77 while in apparent good health.  The patient is the youngest of 6 siblings, the other ones all deceased.  One sister developed breast cancer in her 41s.  There is no other family history of breast, ovarian, pancreatic, or prostate cancer to the patient's knowledge  GYNECOLOGIC HISTORY:  No LMP recorded. Patient is postmenopausal. Menarche: 85 years old Age at first live birth: 85 years old [place] P 2 LMP approximately mid 53s HRT  treated with estropitate ([facility]) for "many years" Hysterectomy?  No Salpingo-oophorectomy?  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  Her husband was a high school principal.  He is now deceased.  The patient has 2 daughters, [name], who lives in [place] and is a retired Paediatric nurse; and [name], who works for [employer] and recently moved to [place].  The patient has 4 grandchildren.  One grandson unfortunately died in a motor vehicle accident.  The patient attends [employer].  [name] generally lives by herself but currently her daughter [name] is staying with her.    ADVANCED DIRECTIVES: The patient has named both her daughters as healthcare powers of attorney but currently daughter [name] would be first since she is the one in town   HEALTH MAINTENANCE: Social History   Tobacco Use  . Smoking status: Former Smoker    Packs/day: 1.50  Years: 10.00    Pack years: 15.00    Types: Cigarettes    Last attempt to quit: 07/09/1985    Years since quitting: 33.2  . Smokeless tobacco: Never Used  . Tobacco comment: 1 1/2 ppd x 20 years  Substance Use Topics  . Alcohol use: No    Alcohol/week: 0.0 standard drinks  . Drug use: No     Colonoscopy: n/a  PAP: n/a  Bone density: Remote   Allergies  Allergen Reactions  . Doxycycline     GI upset  . Sulfadiazine     REACTION: Fever, achey  . Sulfa Antibiotics Rash    Current Outpatient Medications  Medication Sig Dispense Refill  . acetaminophen (TYLENOL) 500 MG tablet Take 500-1,000 mg by mouth every 8 (eight) hours as needed.    . ALPRAZolam (XANAX) 1 MG tablet TAKE 1/2 TABLET BY MOUTH 2 TIMES DAILY AS NEEDED FOR ANXIETY. 30 tablet 2  . amLODipine (NORVASC) 2.5 MG tablet TAKE 1 TABLET BY MOUTH DAILY. 90 tablet 3  . amoxicillin-clavulanate (AUGMENTIN) 875-125 MG tablet Take 1 tablet by mouth 2 (two) times daily. 20 tablet 0  . Calcium  Carbonate (CALTRATE 600 PO) Take 1 tablet by mouth daily.      . cholecalciferol (VITAMIN D) 1000 units tablet Take 1 tablet (1,000 Units total) by mouth daily.    . diclofenac sodium (VOLTAREN) 1 % GEL APPLY 4 GRAMS TO AFFECTED AREA 2 TIMES A DAY. 200 g 3  . DULoxetine (CYMBALTA) 60 MG capsule TAKE 1 CAPSULE BY MOUTH DAILY 90 capsule 2  . ferrous sulfate (FERROUSUL) 325 (65 FE) MG tablet Take 1 tablet (325 mg total) by mouth daily with breakfast.    . fluticasone (FLONASE) 50 MCG/ACT nasal spray Place 1-2 sprays into both nostrils daily as needed for allergies.    . furosemide (LASIX) 20 MG tablet TAKE 1/2 TO 1 TABLET BY MOUTH EVERY MORNING AS NEEDED FOR SWELLING. USE SPARINGLY.  Hold it for one week from now. (Patient taking differently: TAKE 1/2 TO 1 TABLET BY MOUTH EVERY MORNING AS NEEDED FOR SWELLING. USE SPARINGLY.) 30 tablet 0  . gabapentin (NEURONTIN) 300 MG capsule 2 tabs in the AM, 3 tabs in the PM.  Can take an extra tab midday if needed/tolerated. 180 capsule 3  . ibuprofen (ADVIL,MOTRIN) 200 MG tablet Take 3 tablets (600 mg total) by mouth 2 (two) times daily as needed for mild pain. With food    . levothyroxine (SYNTHROID, LEVOTHROID) 112 MCG tablet TAKE 1 TABLET BY MOUTH DAILY BEFORE BREAKFAST. 90 tablet 1  . meclizine (ANTIVERT) 25 MG tablet TAKE 1 TABLET BY MOUTH 3 TIMES DAILY AS NEEDED FOR DIZZINESS. 30 tablet 1  . NON FORMULARY Place 2.5-3 L into the nose daily. OXYGEN    . potassium chloride (K-DUR) 10 MEQ tablet Take 1 tablet (10 mEq total) by mouth daily as needed (If taking lasix/furosemide).    . predniSONE (DELTASONE) 10 MG tablet Take 2 a day for 5 days, then 1 a day for 5 days, with food. Don't take with aleve/ibuprofen. 15 tablet 0  . tiotropium (SPIRIVA HANDIHALER) 18 MCG inhalation capsule PLACE 1 CAPSULE INTO INHALER AND INHALE ONCE A DAY 30 capsule 12  . traZODone (DESYREL) 50 MG tablet TAKE 2 TABLETS (100 MG TOTAL) BY MOUTH AT BEDTIME     No current  facility-administered medications for this visit.     OBJECTIVE: He white woman who appears stated age  There were no vitals filed  for this visit.   There is no height or weight on file to calculate BMI.   Wt Readings from Last 3 Encounters:  08/22/18 148 lb 1 oz (67.2 kg)  03/24/18 149 lb 4 oz (67.7 kg)  09/19/17 148 lb 4 oz (67.2 kg)      ECOG FS:2 - Symptomatic, <50% confined to bed  The patient is wearing oxygen by nasal cannula during the interview Per Dr. Francis Gaines exam 09/01/2018, there is a firm irregular mass in the upper outer right breast, and a smooth round easily palpable mass in the right axilla. Per Dr. Josefine Class exam 08/22/2018, oropharynx was unremarkable, neck was supple, heart showed regular rate and rhythm, pulmonary exam showed no crackles or wheezes, extremities showed no edema  LAB RESULTS:  CMP     Component Value Date/Time   NA 142 08/22/2018 1625   K 3.6 08/22/2018 1625   CL 101 08/22/2018 1625   CO2 34 (H) 08/22/2018 1625   GLUCOSE 93 08/22/2018 1625   BUN 13 08/22/2018 1625   CREATININE 0.62 08/22/2018 1625   CALCIUM 9.0 08/22/2018 1625   PROT 6.7 08/22/2018 1625   ALBUMIN 3.9 03/24/2018 1155   AST 25 08/22/2018 1625   ALT 8 08/22/2018 1625   ALKPHOS 42 03/24/2018 1155   BILITOT 0.6 08/22/2018 1625   GFRNONAA >60 04/07/2017 0444   GFRAA >60 04/07/2017 0444    No results found for: TOTALPROTELP, ALBUMINELP, A1GS, A2GS, BETS, BETA2SER, GAMS, MSPIKE, SPEI  No results found for: Nils Pyle, [facility]  Lab Results  Component Value Date   WBC 7.5 08/22/2018   NEUTROABS 5,355 08/22/2018   HGB 11.1 (L) 08/22/2018   HCT 33.9 (L) 08/22/2018   MCV 94.2 08/22/2018   PLT 188 08/22/2018      Chemistry      Component Value Date/Time   NA 142 08/22/2018 1625   K 3.6 08/22/2018 1625   CL 101 08/22/2018 1625   CO2 34 (H) 08/22/2018 1625   BUN 13 08/22/2018 1625   CREATININE 0.62 08/22/2018 1625      Component Value  Date/Time   CALCIUM 9.0 08/22/2018 1625   ALKPHOS 42 03/24/2018 1155   AST 25 08/22/2018 1625   ALT 8 08/22/2018 1625   BILITOT 0.6 08/22/2018 1625       No results found for: LABCA2  No components found for: LJQGBE010  No results for input(s): INR in the last 168 hours.  No results found for: LABCA2  No results found for: OFH219  No results found for: XJO832  No results found for: PQD826  No results found for: CA2729  No components found for: HGQUANT  No results found for: CEA1 / No results found for: CEA1   No results found for: AFPTUMOR  No results found for: CHROMOGRNA  No results found for: PSA1  No visits with results within 3 Day(s) from this visit.  Latest known visit with results is:  Office Visit on 08/22/2018  Component Date Value Ref Range Status  . Glucose, Bld 08/22/2018 93  65 - 99 mg/dL Final   Comment: .            Fasting reference interval .   . BUN 08/22/2018 13  7 - 25 mg/dL Final  . Creat 08/22/2018 0.62  0.60 - 0.88 mg/dL Final   Comment: For patients >54 years of age, the reference limit for Creatinine is approximately 13% higher for people identified as African-American. .   Havery Moros Ratio  74/94/4967 NOT APPLICABLE  6 - 22 (calc) Final  . Sodium 08/22/2018 142  135 - 146 mmol/L Final  . Potassium 08/22/2018 3.6  3.5 - 5.3 mmol/L Final  . Chloride 08/22/2018 101  98 - 110 mmol/L Final  . CO2 08/22/2018 34* 20 - 32 mmol/L Final  . Calcium 08/22/2018 9.0  8.6 - 10.4 mg/dL Final  . Total Protein 08/22/2018 6.7  6.1 - 8.1 g/dL Final  . Albumin 08/22/2018 3.9  3.6 - 5.1 g/dL Final  . Globulin 08/22/2018 2.8  1.9 - 3.7 g/dL (calc) Final  . AG Ratio 08/22/2018 1.4  1.0 - 2.5 (calc) Final  . Total Bilirubin 08/22/2018 0.6  0.2 - 1.2 mg/dL Final  . Alkaline phosphatase (APISO) 08/22/2018 42  37 - 153 U/L Final  . AST 08/22/2018 25  10 - 35 U/L Final  . ALT 08/22/2018 8  6 - 29 U/L Final  . WBC 08/22/2018 7.5  3.8 - 10.8  Thousand/uL Final  . RBC 08/22/2018 3.60* 3.80 - 5.10 Million/uL Final  . Hemoglobin 08/22/2018 11.1* 11.7 - 15.5 g/dL Final  . HCT 08/22/2018 33.9* 35.0 - 45.0 % Final  . MCV 08/22/2018 94.2  80.0 - 100.0 fL Final  . MCH 08/22/2018 30.8  27.0 - 33.0 pg Final  . MCHC 08/22/2018 32.7  32.0 - 36.0 g/dL Final  . RDW 08/22/2018 13.0  11.0 - 15.0 % Final  . Platelets 08/22/2018 188  140 - 400 Thousand/uL Final  . MPV 08/22/2018 11.1  7.5 - 12.5 fL Final  . Neutro Abs 08/22/2018 5,355  1,500 - 7,800 cells/uL Final  . Lymphs Abs 08/22/2018 1,463  850 - 3,900 cells/uL Final  . Absolute Monocytes 08/22/2018 518  200 - 950 cells/uL Final  . Eosinophils Absolute 08/22/2018 128  15 - 500 cells/uL Final  . Basophils Absolute 08/22/2018 38  0 - 200 cells/uL Final  . Neutrophils Relative % 08/22/2018 71.4  % Final  . Total Lymphocyte 08/22/2018 19.5  % Final  . Monocytes Relative 08/22/2018 6.9  % Final  . Eosinophils Relative 08/22/2018 1.7  % Final  . Basophils Relative 08/22/2018 0.5  % Final  . Iron 08/22/2018 34* 45 - 160 mcg/dL Final  . TIBC 08/22/2018 294  250 - 450 mcg/dL (calc) Final  . %SAT 08/22/2018 12* 16 - 45 % (calc) Final    (this displays the last labs from the last 3 days)  No results found for: TOTALPROTELP, ALBUMINELP, A1GS, A2GS, BETS, BETA2SER, GAMS, MSPIKE, SPEI (this displays SPEP labs)  No results found for: KPAFRELGTCHN, LAMBDASER, KAPLAMBRATIO (kappa/lambda light chains)  No results found for: HGBA, HGBA2QUANT, HGBFQUANT, HGBSQUAN (Hemoglobinopathy evaluation)   No results found for: LDH  Lab Results  Component Value Date   IRON 34 (L) 08/22/2018   TIBC 294 08/22/2018   IRONPCTSAT 12 (L) 08/22/2018   (Iron and TIBC)  No results found for: FERRITIN  Urinalysis    Component Value Date/Time   COLORURINE YELLOW 01/24/2017 0020   APPEARANCEUR HAZY (A) 01/24/2017 0020   LABSPEC 1.009 01/24/2017 0020   PHURINE 7.0 01/24/2017 0020   GLUCOSEU NEGATIVE  01/24/2017 0020   GLUCOSEU NEGATIVE 10/24/2016 1429   HGBUR NEGATIVE 01/24/2017 0020   BILIRUBINUR NEGATIVE 01/24/2017 0020   BILIRUBINUR negative 09/20/2016 1625   KETONESUR NEGATIVE 01/24/2017 0020   PROTEINUR NEGATIVE 01/24/2017 0020   UROBILINOGEN 0.2 10/24/2016 1429   NITRITE NEGATIVE 01/24/2017 0020   LEUKOCYTESUR LARGE (A) 01/24/2017 0020     STUDIES: Cervical  spine series 09/19/2017 shows no fracture or dislocation, stable spondylosis particularly at C4-C6  ELIGIBLE FOR AVAILABLE RESEARCH PROTOCOL: no  ASSESSMENT: 85 y.o. [place] woman status post right breast upper outer quadrant biopsy 09/05/2018 for a clinical T2 N1, stage IIB invasive ductal carcinoma, grade 2, estrogen receptor positive, progesterone receptor negative, HER-2 not amplified, with an MIB-1 of 15%.  PLAN: I spent approximately 45 minutes face to face with [name] with more than 50% of that time spent in counseling and coordination of care. Specifically we reviewed the biology of the patient's diagnosis and the specifics of her situation.  We first reviewed the fact that cancer is not one disease but more than 200 different diseases and that it is important to keep them separate-- otherwise when friends and relatives discuss their own cancer experiences with [name] confusion can result. Similarly we explained that if breast cancer spreads to the bone or liver, the patient would not have bone cancer or liver cancer, but breast cancer in the bone and breast cancer in the liver: one cancer in three places-- not 3 different cancers which otherwise would have to be treated in 3 different ways.  We discussed the need for surgery and radiation eventually if we are going to go for cure.  However right now treatment of the breast itself (as opposed to systemic treatment, of the whole body) has to be postponed because of the current pandemic  We then discussed the rationale for systemic therapy. There is some risk that this  cancer may have already spread to other parts of her body. Patients frequently ask at this point about bone scans, CAT scans and PET scans to find out if they have occult breast cancer somewhere else.  We normally would have proceeded to scans right now but again given the current pandemic formal evaluation is being postponed.    Of course we would proceed to aggressive evaluation of any symptoms that might suggest metastatic disease, but that is not the case at this point.  Next we went over options for systemic therapy which include anti-estrogens, anti-HER-2 immunotherapy, and chemotherapy. [name] does not meet criteria for anti-HER-2 immunotherapy.  As for chemotherapy, we have very little data for this in patients over 40 and certainly we would avoid its in her case given her comorbidities.    She is a good candidate for anti-estrogens and we specifically discussed anastrozole.  This is an antiestrogen, and it blocks estrogen production in the body.  [name] understands she is still producing estrogen, and that her cancer depends on estrogen to grow and make more cancer cells.  When she takes anastrozole her estrogen production will stop and the cancer will "starve".  Accordingly a few weeks after she starts the medication she should notice that the cancer is a little softer and a little smaller.  We discussed the possible toxicities side effects and complications of anastrozole which include hot flashes, vaginal dryness, occasionally some arthralgias and myalgias, and concerns regarding a bone density.  She has not had a bone density in some time but she is on vitamin D supplementation.  Once the current pandemic is over as she will be set up for a bone density as part of her restaging studies and if there is significant osteopenia or osteoporosis we will proceed to denosumab/Prolia treatments.  Abra has a good understanding of the overall plan. She agrees with it. She knows the goal of treatment in  her case is cure. She will call with any  problems that may develop before her next visit here, which will be another virtual visit in approximately 5 weeks.Chauncey Cruel, MD   10/09/2018 9:50 AM Medical Oncology and Hematology [facility] [address] [place], [place] [address] Tel. [phone]    Fax. [phone]

## 2018-10-10 ENCOUNTER — Telehealth: Payer: Self-pay | Admitting: Oncology

## 2018-10-10 NOTE — Telephone Encounter (Signed)
Tried to call I could not get an answer

## 2018-10-20 ENCOUNTER — Telehealth: Payer: Self-pay

## 2018-10-20 MED ORDER — AMOXICILLIN-POT CLAVULANATE 875-125 MG PO TABS: 1.0000 | ORAL_TABLET | Freq: Two times a day (BID) | ORAL | 0 refills | Status: DC

## 2018-10-20 NOTE — Telephone Encounter (Signed)
Pt has not been out of the house for weeks due to pt having cancer;in Feb 2020 was given augmentin for cough and that really helped; pt has developed a non prod cough again; no fever, no wheezing, no travel and no known exposure to covid or flu. Pt has been using cough drops only. Pt said no more SOB than usual since pt has COPD and on 3 L of continuous oxygen. [facility]. Pt request refill of augmentin. Pt said she cannot do a virtual visit and prefers abx sent to pharmacy but if Dr Damita Dunnings cannot refill abx pt would do a phone call visit. Pt request cb.

## 2018-10-20 NOTE — Telephone Encounter (Signed)
Patient returned call

## 2018-10-20 NOTE — Telephone Encounter (Addendum)
Called pt but couldn't get through, busy signal x2.    Called back.  Talked to patient, dry cough.  She didn't have emergent sx and didn't feel the need for ER eval.  She was speaking in complete sentences. No fever.  No covid contacts.    Message for patient: Given her hx, I would restart augmentin.  If she isn't improving with that, then she needs at least phone follow up.  I sent rx.  Update me as needed in the meantime.    She agrees with plan.

## 2018-11-19 NOTE — Progress Notes (Signed)
[name]  [phone] [phone]     ID: [name] DOB: 12-02-1930  MR#: [id number]  QVZ#:[id number]  Patient Care Team: Alice Ghent, MD as PCP - General [name], Alice Dad, MD as Consulting Physician (Oncology) Alice Gala, MD as Consulting Physician (Otolaryngology) Alice Garfinkel, MD as Consulting Physician (Pulmonary Disease) Alice Cruel, MD OTHER MD:  I connected with [name] on 11/20/18 at 10:00 AM EDT by video enabled telemedicine visit and verified that I am speaking with the correct person using two identifiers.   I discussed the limitations, risks, security and privacy concerns of performing an evaluation and management service by telemedicine and the availability of in-person appointments. I also discussed with the patient that there may be a patient responsible charge related to this service. The patient expressed understanding and agreed to proceed.   Other persons participating in the visit and their role in the encounter: [name], scribe; patient's daughter [name]  Patient's location: home Provider's location: [place]    CHIEF COMPLAINT: Estrogen receptor positive breast cancer  CURRENT TREATMENT: Anastrozole   INTERVAL HISTORY: I met with the patient by WebEx with her daughter [name] also present.  [name] continues on anastrozole, with good tolerance.  She does not have any problems with hot flashes or vaginal dryness.  She has urinary frequency, which is longstanding, and some leakage.  This is not related.  She is very excited because she says the mass that used to be in her armpit is now completely gone  Since her last visit, she has not undergone any additional studies.   REVIEW OF SYSTEMS: [name] is pretty much staying at home because of the pandemic.  She does not do a lot of housework.  Her daughter takes care of that and her daughter also does all the shopping for  her.  [name] is very careful about falls.  She has canes and also a walker.  She has fallen in the past but not recently.  She has had no unusual headaches visual changes cough phlegm production pleurisy or shortness of breath change in bowel or bladder habits, fever, bleeding, or rash.  Detailed review of systems today was otherwise stable.  HISTORY OF CURRENT ILLNESS: From the original intake note:  The patient noted a change in the upper outer right breast, as well as a palpable lump in the right axilla, sometime mid-to-late 2019.  She did discuss it briefly with her husband but he was not in good health and she did not want to worry him. She had not had a prior mammogram for better than 2 decades.  More recently she brought this to medical attention and on 09/01/2018 she underwent bilateral diagnostic mammography with tomography and bilateral breast and axillae ultrasonography at the breast center.  This found the breast density to be category B.  In the upper right breast there was an irregular mass with architectural distortion and 2 possible satellite masses.  In the right axilla there were 2 enlarged lymph nodes.  By physical exam there was a firm irregular mass in the upper outer quadrant of the right breast and a smooth easily palpable mass in the right axilla.  Ultrasound of the right breast confirmed a 4.2 cm irregular hypoechoic mass centered at 11:00.  There was no satellite mass seen.  In the right axilla there were 2 enlarged lymph nodes, as well as 1 with normal size but abnormal morphology.  The palpable enlarged  lymph nodes measure 2.9 and 3.1 cm.  The third node measured 0.9 cm but had no hilar fat.  Biopsy of the right breast upper outer quadrant mass on 09/05/2018 showed ([id number]) invasive ductal carcinoma, grade 2, estrogen receptor 95% positive with strong staining intensity, estrogen receptor negative, HER-2 equivocal by immunohistochemistry but negative by FISH with a signals  ratio of 1.16 and copy number per cell 1.80.  The MIB-1 was 15%  The right axillary lymph node biopsied was also positive for carcinoma.  On the left side there was a lucent round circumscribed mass in the upper outer quadrant.  By ultrasound this proved to be a fat necrosis oil cyst requiring no further follow-up  The patient's subsequent history is as detailed below.   PAST MEDICAL HISTORY: Past Medical History:  Diagnosis Date  . Allergic rhinitis, cause unspecified   . Allergy, unspecified not elsewhere classified   . Anxiety   . Asthma   . Chronic kidney disease    frequency  . COPD (chronic obstructive pulmonary disease) ([place])   . Degeneration of intervertebral disc, site unspecified   . Diverticulosis of colon (without mention of hemorrhage)   . Esophageal reflux   . Headache   . Insomnia, unspecified   . Myalgia and myositis, unspecified   . Need for prophylactic hormone replacement therapy (postmenopausal)   . Osteoarthritis of right knee 08/27/2011   R knee pain, s/p arthroscopy 2012 per [name]   . Osteoporosis, unspecified   . Other abnormal blood chemistry   . Other and unspecified hyperlipidemia   . Other chronic pain   . Stroke [facility])    mini strokes  . Unspecified essential hypertension   . Unspecified hypothyroidism     PAST SURGICAL HISTORY: Past Surgical History:  Procedure Laterality Date  . BACK SURGERY    . breast cystectomy    . JOINT REPLACEMENT     shoulder right  . KNEE ARTHROSCOPY     Right knee 2012  . MASS EXCISION Left 04/07/2015   Procedure: MINOR EXCISION OF MASS LEFT SMALL FINGER;  Surgeon: Leanora Cover, MD;  Location: [place];  Service: Orthopedics;  Laterality: Left;  . SHOULDER SURGERY    . TOTAL KNEE ARTHROPLASTY  12/11/2011   Procedure: TOTAL KNEE ARTHROPLASTY;  Surgeon: Johnny Bridge, MD;  Location: [place];  Service: Orthopedics;  Laterality: Right;  . UMBILICAL HERNIA REPAIR N/A 06/09/2013   Procedure:  EXPLORATIORY LAPAROTOMY, HERNIA REPAIR UMBILICAL, INSERTION OF MESH;  Surgeon: Haywood Lasso, MD;  Location: [facility];  Service: General;  Laterality: N/A;    FAMILY HISTORY Family History  Problem Relation Age of Onset  . Lung cancer Brother        x2  . Hypertension Father   . Heart disease Father   . Heart disease Mother   . Asthma Sister   The patient's father died from a heart attack at age 50.  The patient's mother died suddenly at age 88 while in apparent good health.  The patient is the youngest of 6 siblings, the other ones all deceased.  One sister developed breast cancer in her 55s.  There is no other family history of breast, ovarian, pancreatic, or prostate cancer to the patient's knowledge  GYNECOLOGIC HISTORY:  No LMP recorded. Patient is postmenopausal. Menarche: 85 years old Age at first live birth: 85 years old [place] P 2 LMP approximately mid 50s HRT treated with estropitate ([facility]) for "many years" Hysterectomy?  No Salpingo-oophorectomy?  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  Her husband was a high school principal.  He is now deceased.  The patient has 2 daughters, [name], who lives in [place] and is a retired Paediatric nurse; and [name], who works for [employer] and recently moved to [place].  The patient has 4 grandchildren.  One grandson unfortunately died in a motor vehicle accident.  The patient attends [employer].  Roy generally lives by herself but currently her daughter [name] is staying with her.    ADVANCED DIRECTIVES: The patient has named both her daughters as healthcare powers of attorney but currently daughter [name] would be first since she is the one in town   HEALTH MAINTENANCE: Social History   Tobacco Use  . Smoking status: Former Smoker    Packs/day: 1.50    Years: 10.00    Pack years: 15.00    Types: Cigarettes    Last attempt to quit:  07/09/1985    Years since quitting: 33.3  . Smokeless tobacco: Never Used  . Tobacco comment: 1 1/2 ppd x 20 years  Substance Use Topics  . Alcohol use: No    Alcohol/week: 0.0 standard drinks  . Drug use: No     Colonoscopy: n/a  PAP: n/a  Bone density: Remote   Allergies  Allergen Reactions  . Doxycycline     GI upset  . Sulfadiazine     REACTION: Fever, achey  . Sulfa Antibiotics Rash    Current Outpatient Medications  Medication Sig Dispense Refill  . acetaminophen (TYLENOL) 500 MG tablet Take 500-1,000 mg by mouth every 8 (eight) hours as needed.    . ALPRAZolam (XANAX) 1 MG tablet TAKE 1/2 TABLET BY MOUTH 2 TIMES DAILY AS NEEDED FOR ANXIETY. 30 tablet 2  . amLODipine (NORVASC) 2.5 MG tablet TAKE 1 TABLET BY MOUTH DAILY. 90 tablet 3  . anastrozole (ARIMIDEX) 1 MG tablet Take 1 tablet (1 mg total) by mouth daily. 90 tablet 4  . Calcium Carbonate (CALTRATE 600 PO) Take 1 tablet by mouth daily.      . cholecalciferol (VITAMIN D) 1000 units tablet Take 1 tablet (1,000 Units total) by mouth daily.    . diclofenac sodium (VOLTAREN) 1 % GEL APPLY 4 GRAMS TO AFFECTED AREA 2 TIMES A DAY. 200 g 3  . DULoxetine (CYMBALTA) 60 MG capsule TAKE 1 CAPSULE BY MOUTH DAILY 90 capsule 2  . ferrous sulfate (FERROUSUL) 325 (65 FE) MG tablet Take 1 tablet (325 mg total) by mouth daily with breakfast.    . fluticasone (FLONASE) 50 MCG/ACT nasal spray Place 1-2 sprays into both nostrils daily as needed for allergies.    . furosemide (LASIX) 20 MG tablet TAKE 1/2 TO 1 TABLET BY MOUTH EVERY MORNING AS NEEDED FOR SWELLING. USE SPARINGLY.  Hold it for one week from now. (Patient taking differently: TAKE 1/2 TO 1 TABLET BY MOUTH EVERY MORNING AS NEEDED FOR SWELLING. USE SPARINGLY.) 30 tablet 0  . gabapentin (NEURONTIN) 300 MG capsule 2 tabs in the AM, 3 tabs in the PM.  Can take an extra tab midday if needed/tolerated. 180 capsule 3  . ibuprofen (ADVIL,MOTRIN) 200 MG tablet Take 3 tablets (600 mg total)  by mouth 2 (two) times daily as needed for mild pain. With food    . levothyroxine (SYNTHROID, LEVOTHROID) 112 MCG tablet TAKE 1 TABLET BY MOUTH DAILY BEFORE BREAKFAST. 90 tablet 1  .  meclizine (ANTIVERT) 25 MG tablet TAKE 1 TABLET BY MOUTH 3 TIMES DAILY AS NEEDED FOR DIZZINESS. 30 tablet 1  . NON FORMULARY Place 2.5-3 L into the nose daily. OXYGEN    . potassium chloride (K-DUR) 10 MEQ tablet Take 1 tablet (10 mEq total) by mouth daily as needed (If taking lasix/furosemide).    . predniSONE (DELTASONE) 10 MG tablet Take 2 a day for 5 days, then 1 a day for 5 days, with food. Don't take with aleve/ibuprofen. 15 tablet 0  . tiotropium (SPIRIVA HANDIHALER) 18 MCG inhalation capsule PLACE 1 CAPSULE INTO INHALER AND INHALE ONCE A DAY 30 capsule 12  . traZODone (DESYREL) 50 MG tablet TAKE 2 TABLETS (100 MG TOTAL) BY MOUTH AT BEDTIME     No current facility-administered medications for this visit.     OBJECTIVE: He white woman who appears well  There were no vitals filed for this visit.   There is no height or weight on file to calculate BMI.   Wt Readings from Last 3 Encounters:  08/22/18 148 lb 1 oz (67.2 kg)  03/24/18 149 lb 4 oz (67.7 kg)  09/19/17 148 lb 4 oz (67.2 kg)      ECOG FS:2 - Symptomatic, <50% confined to bed  The patient was not wearing her oxygen cannula during the visit today  LAB RESULTS:  CMP     Component Value Date/Time   NA 142 08/22/2018 1625   K 3.6 08/22/2018 1625   CL 101 08/22/2018 1625   CO2 34 (H) 08/22/2018 1625   GLUCOSE 93 08/22/2018 1625   BUN 13 08/22/2018 1625   CREATININE 0.62 08/22/2018 1625   CALCIUM 9.0 08/22/2018 1625   PROT 6.7 08/22/2018 1625   ALBUMIN 3.9 03/24/2018 1155   AST 25 08/22/2018 1625   ALT 8 08/22/2018 1625   ALKPHOS 42 03/24/2018 1155   BILITOT 0.6 08/22/2018 1625   GFRNONAA >60 04/07/2017 0444   GFRAA >60 04/07/2017 0444    No results found for: TOTALPROTELP, ALBUMINELP, A1GS, A2GS, BETS, BETA2SER, GAMS, MSPIKE, SPEI   No results found for: Nils Pyle, KAPLAMBRATIO  Lab Results  Component Value Date   WBC 7.5 08/22/2018   NEUTROABS 5,355 08/22/2018   HGB 11.1 (L) 08/22/2018   HCT 33.9 (L) 08/22/2018   MCV 94.2 08/22/2018   PLT 188 08/22/2018      Chemistry      Component Value Date/Time   NA 142 08/22/2018 1625   K 3.6 08/22/2018 1625   CL 101 08/22/2018 1625   CO2 34 (H) 08/22/2018 1625   BUN 13 08/22/2018 1625   CREATININE 0.62 08/22/2018 1625      Component Value Date/Time   CALCIUM 9.0 08/22/2018 1625   ALKPHOS 42 03/24/2018 1155   AST 25 08/22/2018 1625   ALT 8 08/22/2018 1625   BILITOT 0.6 08/22/2018 1625       No results found for: LABCA2  No components found for: AGTXMI680  No results for input(s): INR in the last 168 hours.  No results found for: LABCA2  No results found for: HOZ224  No results found for: MGN003  No results found for: BCW888  No results found for: CA2729  No components found for: HGQUANT  No results found for: CEA1 / No results found for: CEA1   No results found for: AFPTUMOR  No results found for: CHROMOGRNA  No results found for: PSA1  No visits with results within 3 Day(s) from this visit.  Latest known  visit with results is:  Office Visit on 08/22/2018  Component Date Value Ref Range Status  . Glucose, Bld 08/22/2018 93  65 - 99 mg/dL Final   Comment: .            Fasting reference interval .   . BUN 08/22/2018 13  7 - 25 mg/dL Final  . Creat 08/22/2018 0.62  0.60 - 0.88 mg/dL Final   Comment: For patients >66 years of age, the reference limit for Creatinine is approximately 13% higher for people identified as African-American. .   Havery Moros Ratio 74/02/1447 NOT APPLICABLE  6 - 22 (calc) Final  . Sodium 08/22/2018 142  135 - 146 mmol/L Final  . Potassium 08/22/2018 3.6  3.5 - 5.3 mmol/L Final  . Chloride 08/22/2018 101  98 - 110 mmol/L Final  . CO2 08/22/2018 34* 20 - 32 mmol/L Final  . Calcium  08/22/2018 9.0  8.6 - 10.4 mg/dL Final  . Total Protein 08/22/2018 6.7  6.1 - 8.1 g/dL Final  . Albumin 08/22/2018 3.9  3.6 - 5.1 g/dL Final  . Globulin 08/22/2018 2.8  1.9 - 3.7 g/dL (calc) Final  . AG Ratio 08/22/2018 1.4  1.0 - 2.5 (calc) Final  . Total Bilirubin 08/22/2018 0.6  0.2 - 1.2 mg/dL Final  . Alkaline phosphatase (APISO) 08/22/2018 42  37 - 153 U/L Final  . AST 08/22/2018 25  10 - 35 U/L Final  . ALT 08/22/2018 8  6 - 29 U/L Final  . WBC 08/22/2018 7.5  3.8 - 10.8 Thousand/uL Final  . RBC 08/22/2018 3.60* 3.80 - 5.10 Million/uL Final  . Hemoglobin 08/22/2018 11.1* 11.7 - 15.5 g/dL Final  . HCT 08/22/2018 33.9* 35.0 - 45.0 % Final  . MCV 08/22/2018 94.2  80.0 - 100.0 fL Final  . MCH 08/22/2018 30.8  27.0 - 33.0 pg Final  . MCHC 08/22/2018 32.7  32.0 - 36.0 g/dL Final  . RDW 08/22/2018 13.0  11.0 - 15.0 % Final  . Platelets 08/22/2018 188  140 - 400 Thousand/uL Final  . MPV 08/22/2018 11.1  7.5 - 12.5 fL Final  . Neutro Abs 08/22/2018 5,355  1,500 - 7,800 cells/uL Final  . Lymphs Abs 08/22/2018 1,463  850 - 3,900 cells/uL Final  . Absolute Monocytes 08/22/2018 518  200 - 950 cells/uL Final  . Eosinophils Absolute 08/22/2018 128  15 - 500 cells/uL Final  . Basophils Absolute 08/22/2018 38  0 - 200 cells/uL Final  . Neutrophils Relative % 08/22/2018 71.4  % Final  . Total Lymphocyte 08/22/2018 19.5  % Final  . Monocytes Relative 08/22/2018 6.9  % Final  . Eosinophils Relative 08/22/2018 1.7  % Final  . Basophils Relative 08/22/2018 0.5  % Final  . Iron 08/22/2018 34* 45 - 160 mcg/dL Final  . TIBC 08/22/2018 294  250 - 450 mcg/dL (calc) Final  . %SAT 08/22/2018 12* 16 - 45 % (calc) Final    (this displays the last labs from the last 3 days)  No results found for: TOTALPROTELP, ALBUMINELP, A1GS, A2GS, BETS, BETA2SER, GAMS, MSPIKE, SPEI (this displays SPEP labs)  No results found for: KPAFRELGTCHN, LAMBDASER, KAPLAMBRATIO (kappa/lambda light chains)  No results found  for: HGBA, HGBA2QUANT, HGBFQUANT, HGBSQUAN (Hemoglobinopathy evaluation)   No results found for: LDH  Lab Results  Component Value Date   IRON 34 (L) 08/22/2018   TIBC 294 08/22/2018   IRONPCTSAT 12 (L) 08/22/2018   (Iron and TIBC)  No results found for: FERRITIN  Urinalysis    Component Value Date/Time   COLORURINE YELLOW 01/24/2017 0020   APPEARANCEUR HAZY (A) 01/24/2017 0020   LABSPEC 1.009 01/24/2017 0020   PHURINE 7.0 01/24/2017 0020   GLUCOSEU NEGATIVE 01/24/2017 0020   GLUCOSEU NEGATIVE 10/24/2016 1429   HGBUR NEGATIVE 01/24/2017 0020   BILIRUBINUR NEGATIVE 01/24/2017 0020   BILIRUBINUR negative 09/20/2016 1625   KETONESUR NEGATIVE 01/24/2017 0020   PROTEINUR NEGATIVE 01/24/2017 0020   UROBILINOGEN 0.2 10/24/2016 1429   NITRITE NEGATIVE 01/24/2017 0020   LEUKOCYTESUR LARGE (A) 01/24/2017 0020     STUDIES: No results found.  ELIGIBLE FOR AVAILABLE RESEARCH PROTOCOL: no  ASSESSMENT: 85 y.o. [place] woman status post right breast upper outer quadrant biopsy 09/05/2018 for a clinical T2 N1, stage IIB invasive ductal carcinoma, grade 2, estrogen receptor positive, progesterone receptor negative, HER-2 not amplified, with an MIB-1 of 15%.  (1) anastrozole started 10/09/2018  PLAN: Margaret is tolerating anastrozole well and it appears to be working for her.  Clinically she tells me the mass in her axilla has resolved.  She is very pleased with this medicine.  We do need to document this and I am setting her up for a right breast ultrasound as well as bone density in late August.  She will see me again in early September and that will be a WebEx visit.  She is appropriately concerned about falls and is taking good precautions regarding that.  They are also being very careful regarding the current pandemic  At this point I am delighted at how well Trenda is doing.  We will touch base again in early September after her August studies and that also will be a WebEx visit   She knows to call for any other issues that may develop before then.  Alice Cruel, MD   11/20/2018 10:11 AM Medical Oncology and Hematology [facility] [address] [place], [place] [address] Tel. [phone]    Fax. [phone]   I, [name], am acting as scribe for Dr. Virgie [name]. Mariselda Badalamenti.  I, Lurline Del MD, have reviewed the above documentation for accuracy and completeness, and I agree with the above.

## 2018-11-20 ENCOUNTER — Inpatient Hospital Stay: Payer: Medicare Other | Attending: Oncology | Admitting: Oncology

## 2018-11-20 DIAGNOSIS — Z79899 Other long term (current) drug therapy: Secondary | ICD-10-CM

## 2018-11-20 DIAGNOSIS — Z791 Long term (current) use of non-steroidal anti-inflammatories (NSAID): Secondary | ICD-10-CM

## 2018-11-20 DIAGNOSIS — E559 Vitamin D deficiency, unspecified: Secondary | ICD-10-CM | POA: Diagnosis not present

## 2018-11-20 DIAGNOSIS — Z9181 History of falling: Secondary | ICD-10-CM

## 2018-11-20 DIAGNOSIS — Z170 Estrogen receptor positive status [ER+]: Secondary | ICD-10-CM

## 2018-11-20 DIAGNOSIS — C50411 Malignant neoplasm of upper-outer quadrant of right female breast: Secondary | ICD-10-CM | POA: Diagnosis not present

## 2018-11-20 DIAGNOSIS — Z87891 Personal history of nicotine dependence: Secondary | ICD-10-CM

## 2018-11-20 MED ORDER — ANASTROZOLE 1 MG PO TABS: 1.0000 mg | ORAL_TABLET | Freq: Every day | ORAL | 4 refills | Status: DC

## 2018-12-27 ENCOUNTER — Emergency Department (HOSPITAL_COMMUNITY)
Admission: EM | Admit: 2018-12-27 | Discharge: 2018-12-27 | Disposition: A | Discharge status: Home / Self Care | Payer: Medicare Other | Attending: Emergency Medicine | Admitting: Emergency Medicine

## 2018-12-27 ENCOUNTER — Emergency Department (HOSPITAL_COMMUNITY): Payer: Medicare Other

## 2018-12-27 ENCOUNTER — Other Ambulatory Visit: Payer: Self-pay

## 2018-12-27 DIAGNOSIS — Z79899 Other long term (current) drug therapy: Secondary | ICD-10-CM | POA: Insufficient documentation

## 2018-12-27 DIAGNOSIS — N189 Chronic kidney disease, unspecified: Secondary | ICD-10-CM | POA: Insufficient documentation

## 2018-12-27 DIAGNOSIS — Z8673 Personal history of transient ischemic attack (TIA), and cerebral infarction without residual deficits: Secondary | ICD-10-CM | POA: Diagnosis not present

## 2018-12-27 DIAGNOSIS — J449 Chronic obstructive pulmonary disease, unspecified: Secondary | ICD-10-CM | POA: Diagnosis not present

## 2018-12-27 DIAGNOSIS — R1013 Epigastric pain: Secondary | ICD-10-CM

## 2018-12-27 DIAGNOSIS — I129 Hypertensive chronic kidney disease with stage 1 through stage 4 chronic kidney disease, or unspecified chronic kidney disease: Secondary | ICD-10-CM | POA: Insufficient documentation

## 2018-12-27 DIAGNOSIS — Z96611 Presence of right artificial shoulder joint: Secondary | ICD-10-CM | POA: Diagnosis not present

## 2018-12-27 DIAGNOSIS — Z87891 Personal history of nicotine dependence: Secondary | ICD-10-CM | POA: Insufficient documentation

## 2018-12-27 DIAGNOSIS — Z96651 Presence of right artificial knee joint: Secondary | ICD-10-CM | POA: Diagnosis not present

## 2018-12-27 DIAGNOSIS — E039 Hypothyroidism, unspecified: Secondary | ICD-10-CM | POA: Diagnosis not present

## 2018-12-27 DIAGNOSIS — R1030 Lower abdominal pain, unspecified: Secondary | ICD-10-CM | POA: Diagnosis not present

## 2018-12-27 LAB — COMPREHENSIVE METABOLIC PANEL
ALT: 13 U/L (ref 0–44)
AST: 28 U/L (ref 15–41)
Albumin: 3.7 g/dL (ref 3.5–5.0)
Alkaline Phosphatase: 46 U/L (ref 38–126)
Anion gap: 9 (ref 5–15)
BUN: 12 mg/dL (ref 8–23)
CO2: 31 mmol/L (ref 22–32)
Calcium: 9 mg/dL (ref 8.9–10.3)
Chloride: 102 mmol/L (ref 98–111)
Creatinine, Ser: 0.52 mg/dL (ref 0.44–1.00)
GFR calc Af Amer: >60 mL/min (ref >60)
GFR calc non Af Amer: >60 mL/min (ref >60)
Glucose, Bld: 115 mg/dL — ABNORMAL HIGH (ref 70–99)
Potassium: 3.6 mmol/L (ref 3.5–5.1)
Sodium: 142 mmol/L (ref 135–145)
Total Bilirubin: 0.2 mg/dL — ABNORMAL LOW (ref 0.3–1.2)
Total Protein: 7.2 g/dL (ref 6.5–8.1)

## 2018-12-27 LAB — CBC WITH DIFFERENTIAL/PLATELET
Abs Immature Granulocytes: 0.01 10*3/uL (ref 0.00–0.07)
Basophils Absolute: 0.1 10*3/uL (ref 0.0–0.1)
Basophils Relative: 1 %
Eosinophils Absolute: 0.3 10*3/uL (ref 0.0–0.5)
Eosinophils Relative: 6 %
HCT: 39.4 % (ref 36.0–46.0)
Hemoglobin: 12.5 g/dL (ref 12.0–15.0)
Immature Granulocytes: 0 %
Lymphocytes Relative: 36 %
Lymphs Abs: 2 10*3/uL (ref 0.7–4.0)
MCH: 32.3 pg (ref 26.0–34.0)
MCHC: 31.7 g/dL (ref 30.0–36.0)
MCV: 101.8 fL — ABNORMAL HIGH (ref 80.0–100.0)
Monocytes Absolute: 0.5 10*3/uL (ref 0.1–1.0)
Monocytes Relative: 8 %
Neutro Abs: 2.7 10*3/uL (ref 1.7–7.7)
Neutrophils Relative %: 49 %
Platelets: 196 10*3/uL (ref 150–400)
RBC: 3.87 MIL/uL (ref 3.87–5.11)
RDW: 12.9 % (ref 11.5–15.5)
WBC: 5.6 10*3/uL (ref 4.0–10.5)
nRBC: 0 % (ref 0.0–0.2)

## 2018-12-27 LAB — LIPASE, BLOOD: Lipase: 25 U/L (ref 11–51)

## 2018-12-27 LAB — URINALYSIS, ROUTINE W REFLEX MICROSCOPIC
Bilirubin Urine: NEGATIVE
Glucose, UA: NEGATIVE mg/dL
Hgb urine dipstick: NEGATIVE
Ketones, ur: NEGATIVE mg/dL
Leukocytes,Ua: NEGATIVE
Nitrite: NEGATIVE
Protein, ur: NEGATIVE mg/dL
Specific Gravity, Urine: 1.012 (ref 1.005–1.030)
pH: 6 (ref 5.0–8.0)

## 2018-12-27 MED ORDER — ALUM & MAG HYDROXIDE-SIMETH 200-200-20 MG/5ML PO SUSP
15.0000 mL | Freq: Once | ORAL | Status: AC
  Administered 2018-12-27: 15 mL via ORAL
  Filled 2018-12-27: qty 30

## 2018-12-27 MED ORDER — ONDANSETRON HCL 4 MG/2ML IJ SOLN
4.0000 mg | Freq: Once | INTRAMUSCULAR | Status: AC
  Administered 2018-12-27: 4 mg via INTRAVENOUS
  Filled 2018-12-27: qty 2

## 2018-12-27 MED ORDER — LIDOCAINE VISCOUS HCL 2 % MT SOLN
15.0000 mL | Freq: Once | OROMUCOSAL | Status: AC
  Administered 2018-12-27: 15 mL via OROMUCOSAL
  Filled 2018-12-27: qty 15

## 2018-12-27 MED ORDER — FENTANYL CITRATE (PF) 100 MCG/2ML IJ SOLN
50.0000 ug | Freq: Once | INTRAMUSCULAR | Status: AC
  Administered 2018-12-27: 50 ug via INTRAVENOUS

## 2018-12-27 MED ORDER — LACTATED RINGERS IV BOLUS
1000.0000 mL | Freq: Once | INTRAVENOUS | Status: AC
  Administered 2018-12-27: 1000 mL via INTRAVENOUS

## 2018-12-27 MED ORDER — SODIUM CHLORIDE (PF) 0.9 % IJ SOLN
INTRAMUSCULAR | Status: AC
  Filled 2018-12-27: qty 50

## 2018-12-27 MED ORDER — IOHEXOL 300 MG/ML  SOLN
100.0000 mL | Freq: Once | INTRAMUSCULAR | Status: AC | PRN
  Administered 2018-12-27: 100 mL via INTRAVENOUS

## 2018-12-27 MED ORDER — FENTANYL CITRATE (PF) 100 MCG/2ML IJ SOLN
50.0000 ug | Freq: Once | INTRAMUSCULAR | Status: AC
  Administered 2018-12-27: 50 ug via INTRAVENOUS
  Filled 2018-12-27: qty 2

## 2018-12-27 NOTE — ED Triage Notes (Signed)
Pt c/o abd pain  Onset 2.5 hrs ago denies N/V/D occurred immediately after having a BM

## 2018-12-27 NOTE — ED Provider Notes (Signed)
[place] DEPT Provider Note   CSN: [id number] Arrival date & time: 12/27/18  0145    History   Chief Complaint Chief Complaint  Patient presents with  . Abdominal Pain    HPI [name] is a 85 y.o. female.      Abdominal Pain Pain location:  Epigastric and suprapubic Pain quality: sharp   Pain radiates to:  Does not radiate Pain severity:  Moderate Onset quality:  Sudden Timing:  Constant Progression:  Improving Chronicity:  New Relieved by:  None tried Worsened by:  Nothing   Past Medical History:  Diagnosis Date  . Allergic rhinitis, cause unspecified   . Allergy, unspecified not elsewhere classified   . Anxiety   . Asthma   . Chronic kidney disease    frequency  . COPD (chronic obstructive pulmonary disease) ([place])   . Degeneration of intervertebral disc, site unspecified   . Diverticulosis of colon (without mention of hemorrhage)   . Esophageal reflux   . Headache   . Insomnia, unspecified   . Myalgia and myositis, unspecified   . Need for prophylactic hormone replacement therapy (postmenopausal)   . Osteoarthritis of right knee 08/27/2011   R knee pain, s/p arthroscopy 2012 per [name]   . Osteoporosis, unspecified   . Other abnormal blood chemistry   . Other and unspecified hyperlipidemia   . Other chronic pain   . Stroke [facility])    mini strokes  . Unspecified essential hypertension   . Unspecified hypothyroidism     Patient Active Problem List   Diagnosis Date Noted  . Malignant neoplasm of upper-outer quadrant of right breast in female, estrogen receptor positive ([place]) 09/26/2018  . Breast mass 08/28/2018  . Thoracic aortic aneurysm ([place]) 04/10/2017  . Abdominal pain 04/05/2017  . Syncope 01/24/2017  . Low back pain 01/01/2017  . Medicare annual wellness visit, subsequent 09/26/2016  . Hard of hearing 09/26/2016  . Vitamin D deficiency 09/26/2016  . Advance care planning 09/26/2016  . Encounter  for chronic pain management 07/26/2016  . Edema 10/21/2014  . Multinodular thyroid 07/07/2013  . HTN (hypertension) 06/09/2013  . Chronic respiratory failure ([place]) 05/13/2013  . Cough 05/02/2013  . Vertigo 12/31/2012  . Osteoarthritis of right knee 08/27/2011  . Gold C Copd with asthmatic bronchitis component and chronic resp failure 06/26/2011  . Neck pain 04/02/2011  . RISK OF FALLING 08/17/2010  . Hypokalemia 05/24/2009  . Anemia, unspecified 04/10/2008  . Anxiety state 03/01/2008  . Osteoporosis 06/23/2007  . Chronic pain syndrome 12/26/2006  . Hypothyroidism 12/24/2006  . HYPERLIPIDEMIA 12/24/2006  . COMMON MIGRAINE 12/24/2006  . GERD 12/24/2006  . DEGENERATIVE DISC DISEASE 12/24/2006  . Fibromyalgia 12/24/2006  . Insomnia 12/24/2006  . HYPERGLYCEMIA 12/24/2006    Past Surgical History:  Procedure Laterality Date  . BACK SURGERY    . breast cystectomy    . JOINT REPLACEMENT     shoulder right  . KNEE ARTHROSCOPY     Right knee 2012  . MASS EXCISION Left 04/07/2015   Procedure: MINOR EXCISION OF MASS LEFT SMALL FINGER;  Surgeon: Leanora Cover, MD;  Location: [place];  Service: Orthopedics;  Laterality: Left;  . SHOULDER SURGERY    . TOTAL KNEE ARTHROPLASTY  12/11/2011   Procedure: TOTAL KNEE ARTHROPLASTY;  Surgeon: Johnny Bridge, MD;  Location: [place];  Service: Orthopedics;  Laterality: Right;  . UMBILICAL HERNIA REPAIR N/A 06/09/2013   Procedure: EXPLORATIORY LAPAROTOMY, HERNIA REPAIR UMBILICAL, INSERTION OF  MESH;  Surgeon: Haywood Lasso, MD;  Location: [place];  Service: General;  Laterality: N/A;     OB History   No obstetric history on file.      Home Medications    Prior to Admission medications   Medication Sig Start Date End Date Taking? Authorizing Provider  ALPHA LIPOIC ACID PO Take 1 tablet by mouth 2 (two) times a day.   Yes [provider]  ALPRAZolam (XANAX) 1 MG tablet TAKE 1/2 TABLET BY MOUTH 2 TIMES DAILY AS NEEDED  FOR ANXIETY. Patient taking differently: Take 0.5 mg by mouth 2 (two) times daily.  09/26/18  Yes Tonia Ghent, MD  amLODipine (NORVASC) 2.5 MG tablet TAKE 1 TABLET BY MOUTH DAILY. Patient taking differently: Take 2.5 mg by mouth daily.  11/11/17  Yes Tonia Ghent, MD  anastrozole (ARIMIDEX) 1 MG tablet Take 1 tablet (1 mg total) by mouth daily. 11/20/18  Yes Magrinat, Virgie Dad, MD  Calcium Carbonate (CALTRATE 600 PO) Take 600 mg by mouth daily.    Yes [provider]  cholecalciferol (VITAMIN D) 1000 units tablet Take 1 tablet (1,000 Units total) by mouth daily. 09/25/16  Yes Tonia Ghent, MD  diclofenac sodium (VOLTAREN) 1 % GEL APPLY 4 GRAMS TO AFFECTED AREA 2 TIMES A DAY. Patient taking differently: Apply 4 g topically 2 (two) times daily as needed (pain).  08/26/17  Yes Tonia Ghent, MD  DULoxetine (CYMBALTA) 60 MG capsule TAKE 1 CAPSULE BY MOUTH DAILY Patient taking differently: Take 60 mg by mouth daily.  08/21/18  Yes Tonia Ghent, MD  ferrous sulfate (FERROUSUL) 325 (65 FE) MG tablet Take 1 tablet (325 mg total) by mouth daily with breakfast. 08/22/18  Yes Tonia Ghent, MD  hydroxypropyl methylcellulose / hypromellose (ISOPTO TEARS / GONIOVISC) 2.5 % ophthalmic solution Place 1 drop into both eyes 3 (three) times daily as needed for dry eyes.   Yes [provider]  levothyroxine (SYNTHROID, LEVOTHROID) 112 MCG tablet TAKE 1 TABLET BY MOUTH DAILY BEFORE BREAKFAST. Patient taking differently: Take 112 mcg by mouth daily before breakfast.  09/25/18  Yes Tonia Ghent, MD  Misc Natural Products (COLON CLEANSE) CAPS Take 1 capsule by mouth 2 (two) times a day.   Yes [provider]  Multiple Vitamin (MULTIVITAMIN WITH MINERALS) TABS tablet Take 1 tablet by mouth daily.   Yes [provider]  Multiple Vitamins-Minerals (OCUVITE PRESERVISION PO) Take 1 tablet by mouth daily.   Yes [provider]  traZODone (DESYREL) 50 MG tablet TAKE  2 TABLETS (100 MG TOTAL) BY MOUTH AT BEDTIME Patient taking differently: Take 100 mg by mouth at bedtime.  02/04/17  Yes Tonia Ghent, MD  gabapentin (NEURONTIN) 300 MG capsule 2 tabs in the AM, 3 tabs in the PM.  Can take an extra tab midday if needed/tolerated. Patient taking differently: Take 300 mg by mouth at bedtime.  10/16/17   Tonia Ghent, MD  ibuprofen (ADVIL,MOTRIN) 200 MG tablet Take 3 tablets (600 mg total) by mouth 2 (two) times daily as needed for mild pain. With food 07/30/17   Tonia Ghent, MD  NON FORMULARY Place 2.5-3 L into the nose daily. OXYGEN    [provider]  predniSONE (DELTASONE) 10 MG tablet Take 2 a day for 5 days, then 1 a day for 5 days, with food. Don't take with aleve/ibuprofen. Patient not taking: Reported on 12/27/2018 08/22/18   Tonia Ghent, MD  tiotropium (  SPIRIVA HANDIHALER) 18 MCG inhalation capsule PLACE 1 CAPSULE INTO INHALER AND INHALE ONCE A DAY Patient taking differently: Place 18 mcg into inhaler and inhale daily.  01/27/18   Tonia Ghent, MD  fluticasone Asencion Islam) 50 MCG/ACT nasal spray Place 1-2 sprays into both nostrils daily as needed for allergies. Patient not taking: Reported on 12/27/2018 02/04/17 12/27/18  Tonia Ghent, MD  furosemide (LASIX) 20 MG tablet TAKE 1/2 TO 1 TABLET BY MOUTH EVERY MORNING AS NEEDED FOR SWELLING. USE SPARINGLY.  Hold it for one week from now. Patient not taking: Reported on 12/27/2018 02/01/17 12/27/18  Hosie Poisson, MD  potassium chloride (K-DUR) 10 MEQ tablet Take 1 tablet (10 mEq total) by mouth daily as needed (If taking lasix/furosemide). Patient not taking: Reported on 12/27/2018 02/04/17 12/27/18  Tonia Ghent, MD    Family History Family History  Problem Relation Age of Onset  . Lung cancer Brother        x2  . Hypertension Father   . Heart disease Father   . Heart disease Mother   . Asthma Sister     Social History Social History   Tobacco Use  . Smoking status: Former  Smoker    Packs/day: 1.50    Years: 10.00    Pack years: 15.00    Types: Cigarettes    Quit date: 07/09/1985    Years since quitting: 33.4  . Smokeless tobacco: Never Used  . Tobacco comment: 1 1/2 ppd x 20 years  Substance Use Topics  . Alcohol use: No    Alcohol/week: 0.0 standard drinks  . Drug use: No     Allergies   Doxycycline, Sulfadiazine, and Sulfa antibiotics   Review of Systems Review of Systems  Gastrointestinal: Positive for abdominal pain.  All other systems reviewed and are negative.    Physical Exam Updated Vital Signs BP 103/67   Pulse (!) 113   Temp 98 F (36.7 C) (Oral)   Resp 16   SpO2 95%   Physical Exam Vitals signs and nursing note reviewed.  Constitutional:      Appearance: She is well-developed.  HENT:     Head: Normocephalic and atraumatic.     Nose: Nose normal. No congestion or rhinorrhea.     Mouth/Throat:     Mouth: Mucous membranes are moist.     Pharynx: Oropharynx is clear.  Eyes:     Extraocular Movements: Extraocular movements intact.     Conjunctiva/sclera: Conjunctivae normal.     Pupils: Pupils are equal, round, and reactive to light.  Neck:     Musculoskeletal: Normal range of motion.  Cardiovascular:     Rate and Rhythm: Normal rate and regular rhythm.  Pulmonary:     Effort: No respiratory distress.     Breath sounds: No stridor.  Abdominal:     General: There is no distension.     Palpations: Abdomen is soft.     Tenderness: There is abdominal tenderness in the epigastric area, periumbilical area and suprapubic area.  Musculoskeletal: Normal range of motion.        General: No swelling or tenderness.  Skin:    General: Skin is warm and dry.  Neurological:     Mental Status: She is alert.      ED Treatments / Results  Labs (all labs ordered are listed, but only abnormal results are displayed) Labs Reviewed  COMPREHENSIVE METABOLIC PANEL - Abnormal; Notable for the following components:      Result Value  Glucose, Bld 115 (*)    Total Bilirubin 0.2 (*)    All other components within normal limits  CBC WITH DIFFERENTIAL/PLATELET - Abnormal; Notable for the following components:   MCV 101.8 (*)    All other components within normal limits  LIPASE, BLOOD  URINALYSIS, ROUTINE W REFLEX MICROSCOPIC    EKG None  Radiology Dg Chest 2 View  Result Date: 12/27/2018 CLINICAL DATA:  Upper abdominal pain EXAM: CHEST - 2 VIEW COMPARISON:  Abdominal series 04/06/2017 FINDINGS: Unchanged heart size and mediastinal contours with aortic tortuosity. No pulmonary edema. Mild left basilar atelectasis or scarring. No acute airspace disease. No pleural fluid or pneumothorax. Right shoulder arthroplasty. Bones under mineralized. IMPRESSION: No acute chest findings. Electronically Signed   By: Keith Rake M.D.   On: 12/27/2018 02:57   Dg Abd 1 View  Result Date: 12/27/2018 CLINICAL DATA:  Upper abdominal pain. EXAM: ABDOMEN - 1 VIEW COMPARISON:  CT 06/08/2013, radiograph 04/06/2017 FINDINGS: No evidence of free intra-abdominal air. Mildly dilated small bowel in the right upper abdomen at 3.3 cm. Moderate stool in the transverse and splenic flexures of the colon. No radiopaque calculi or abnormal soft tissue calcifications. Generalized soft tissue fullness in the pelvis, enlarged uterus seen on prior CT. Bones are under mineralized. IMPRESSION: Nonspecific mildly dilated small bowel in the right abdomen at 3.3 cm. Differential considerations include enteritis, ileus, or early obstruction. Electronically Signed   By: Keith Rake M.D.   On: 12/27/2018 02:55   Ct Abdomen Pelvis W Contrast  Result Date: 12/27/2018 CLINICAL DATA:  Acute generalized abdominal pain. EXAM: CT ABDOMEN AND PELVIS WITH CONTRAST TECHNIQUE: Multidetector CT imaging of the abdomen and pelvis was performed using the standard protocol following bolus administration of intravenous contrast. CONTRAST:  11mL OMNIPAQUE IOHEXOL 300 MG/ML   SOLN COMPARISON:  06/08/2013 CT angiogram of the chest, abdomen and pelvis. FINDINGS: Lower chest: Patchy tree-in-bud opacities in the basilar left lower lobe. Coronary atherosclerosis. Hepatobiliary: Liver surface appears finely irregular, cannot exclude hepatic cirrhosis. No liver masses. Normal gallbladder with no radiopaque cholelithiasis. No biliary ductal dilatation. Pancreas: Normal, with no mass or duct dilation. Spleen: Normal size. No mass. Adrenals/Urinary Tract: Normal adrenals. Subcentimeter hypodense renal cortical lesions in the posterior left kidney, too small to characterize, requiring no follow-up. Otherwise normal kidneys, with no hydronephrosis. Normal bladder. Stomach/Bowel: Suggestion of mild wall thickening in the body and antrum of the stomach. Stomach is nondistended. Normal caliber small bowel with no small bowel wall thickening. Normal appendix. Tortuous large bowel with moderate colonic stool volume. Mild left colonic diverticulosis, with no large bowel wall thickening or significant pericolonic fat stranding. Vascular/Lymphatic: Atherosclerotic abdominal aorta with 3.9 cm infrarenal abdominal aortic aneurysm, increased from 3.3 cm in 2014 CT. Patent portal, splenic, hepatic and renal veins. No pathologically enlarged lymph nodes in the abdomen or pelvis. Reproductive: Markedly enlarged heterogeneous uterus measures 18.4 x 9.3 x 13.0 cm, previously 18.5 x 9.1 x 12.6 cm, not appreciably changed since 06/08/2013 CT. No adnexal masses. Other: No pneumoperitoneum, ascites or focal fluid collection. Musculoskeletal: No aggressive appearing focal osseous lesions. Marked thoracolumbar spondylosis. Stable chronic mild T11 vertebral compression fracture. IMPRESSION: 1. Suggestion of mild wall thickening in the body and antrum of the stomach, cannot exclude gastritis or other etiologies. 2. No evidence of bowel obstruction or acute bowel inflammation. Normal appendix. Moderate colonic stool volume  suggests constipation. Mild left colonic diverticulosis, with no evidence of acute diverticulitis. 3. Patchy tree-in-bud opacities at the left lung base,  indicative nonspecific bronchiolitis, with the differential including aspiration. 4. Increased 3.9 cm infrarenal abdominal Aortic Aneurysm (ICD10-I71.9). Recommend follow-up aortic ultrasound in 2 years. This recommendation follows ACR consensus guidelines: White Paper of the ACR Incidental Findings Committee II on Vascular Findings. J Am Coll Radiol 2013; 10:789-794. 5. Finely irregular liver surface, cannot exclude appendix cirrhosis. No liver masses. Consider hepatic elastography for further liver fibrosis risk stratification, as clinically warranted. 6. Markedly enlarged heterogeneous uterus, unchanged since 2014, presumably myomatous uterus. 7.  Aortic Atherosclerosis (ICD10-I70.0). Electronically Signed   By: Ilona Sorrel M.D.   On: 12/27/2018 05:03    Procedures Procedures (including critical care time)  Medications Ordered in ED Medications  sodium chloride (PF) 0.9 % injection (has no administration in time range)  fentaNYL (SUBLIMAZE) injection 50 mcg (50 mcg Intravenous Given 12/27/18 0224)  lactated ringers bolus 1,000 mL (0 mLs Intravenous Stopped 12/27/18 0445)  ondansetron (ZOFRAN) injection 4 mg (4 mg Intravenous Given 12/27/18 0224)  iohexol (OMNIPAQUE) 300 MG/ML solution 100 mL (100 mLs Intravenous Contrast Given 12/27/18 0420)  fentaNYL (SUBLIMAZE) injection 50 mcg (50 mcg Intravenous Given 12/27/18 0543)  alum & mag hydroxide-simeth (MAALOX/MYLANTA) 200-200-20 MG/5ML suspension 15 mL (15 mLs Oral Given 12/27/18 0543)  lidocaine (XYLOCAINE) 2 % viscous mouth solution 15 mL (15 mLs Mouth/Throat Given 12/27/18 0543)     Initial Impression / Assessment and Plan / ED Course  I have reviewed the triage vital signs and the nursing notes.  Pertinent labs & imaging results that were available during my care of the patient were reviewed by  me and considered in my medical decision making (see chart for details).    Significant history of bowel obstruction, hernia so CT scan done to evaluate for those and to make sure she did not have a some type of perforation with acute onset watery bowel movement.  This was normal and she was observed for a few hours and she had continuous relief of pain with normal vital signs.  Of note the last heart rates is 113 however when I evaluate her prior to discharge it was in the 80s, I suspect this is erroneous.  Final Clinical Impressions(s) / ED Diagnoses   Final diagnoses:  Epigastric pain    ED Discharge Orders    None       Napolean Sia, Corene Cornea, MD 12/27/18 [phone]

## 2018-12-29 ENCOUNTER — Other Ambulatory Visit: Payer: Self-pay | Admitting: Family Medicine

## 2018-12-30 ENCOUNTER — Ambulatory Visit (INDEPENDENT_AMBULATORY_CARE_PROVIDER_SITE_OTHER): Payer: Medicare Other | Admitting: Family Medicine

## 2018-12-30 ENCOUNTER — Other Ambulatory Visit: Payer: Self-pay

## 2018-12-30 DIAGNOSIS — I714 Abdominal aortic aneurysm, without rupture, unspecified: Secondary | ICD-10-CM

## 2018-12-30 DIAGNOSIS — R109 Unspecified abdominal pain: Secondary | ICD-10-CM

## 2018-12-30 MED ORDER — OMEPRAZOLE 20 MG PO CPDR: 20.0000 mg | DELAYED_RELEASE_CAPSULE | Freq: Every day | ORAL | 3 refills | Status: DC

## 2018-12-30 MED ORDER — GABAPENTIN 300 MG PO CAPS: ORAL_CAPSULE | ORAL | 1 refills | Status: DC

## 2018-12-30 MED ORDER — DOCUSATE SODIUM 100 MG PO CAPS: 100.0000 mg | ORAL_CAPSULE | Freq: Every day | ORAL | 1 refills | Status: DC | PRN

## 2018-12-30 NOTE — Telephone Encounter (Signed)
Electronic refill request. Gabapentin Last office visit:   12/30/2018 Last Filled:    270 capsule 1 12/30/2018  Please advise.

## 2018-12-30 NOTE — Progress Notes (Signed)
Interactive audio and video telecommunications were attempted between this provider and patient, however failed, due to patient having technical difficulties OR patient did not have access to video capability.  We continued and completed visit with audio only.   Virtual Visit via Telephone Note  I connected with patient on 12/30/18  at 8:05 AM  by telephone and verified that I am speaking with the correct person using two identifiers.  Location of patient: home.   Location of MD: [facility] Name of referring provider (if blank then none associated): Names per persons and role in encounter:  MD: Earlyne Iba, Patient: name listed above.    I discussed the limitations, risks, security and privacy concerns of performing an evaluation and management service by telephone and the availability of in person appointments. I also discussed with the patient that there may be a patient responsible charge related to this service. The patient expressed understanding and agreed to proceed.  CC ER f/u  History of Present Illness:   CT d/w pt.   1. Suggestion of mild wall thickening in the body and antrum of the stomach, cannot exclude gastritis or other etiologies. 2. No evidence of bowel obstruction or acute bowel inflammation. Normal appendix. Moderate colonic stool volume suggests constipation. Mild left colonic diverticulosis, with no evidence of acute diverticulitis. 3. Patchy tree-in-bud opacities at the left lung base, indicative nonspecific bronchiolitis, with the differential including aspiration. 4. Increased 3.9 cm infrarenal abdominal Aortic Aneurysm (ICD10-I71.9). Recommend follow-up aortic ultrasound in 2 years. This recommendation follows ACR consensus guidelines: White Paper of the ACR Incidental Findings Committee II on Vascular Findings. J Am Coll Radiol 2013; 10:789-794. 5. Finely irregular liver surface, cannot exclude appendix cirrhosis. No liver masses. Consider hepatic  elastography for further liver fibrosis risk stratification, as clinically warranted. 6. Markedly enlarged heterogeneous uterus, unchanged since 2014, presumably myomatous uterus. 7.  Aortic Atherosclerosis (ICD10-I70.0).  ER eval d/w pt.  Presented with abd pain with unremarkable labs and imaging done.  She improved and was discharged home.  AAA d/w pt.     Minimal abd pain in the meantime, this is clearly improved.    She has had more heartburn in the meantime and has been taking TUMS with relief.  No blood in stool.  No fevers.  No sputum usually, on seldom/rare sputum and when she has any sputum it is clear.    Observations/Objective: nad Speech wnl  Assessment and Plan:  AAA, no f/u needed right now.  D/w pt.  Likely incidental and unrelated.    abd pain, with GERD sx.  Start prilosec 20mg  a day and use TUMS prn.  She'll update me as needed.    She wanted to try alpha lipoic acid for her leg pain and this is reasonable to try.    She asked about stool softener.  She can get colace OTC and use prn.   Follow Up Instructions: see above, she'll update me as needed.     I discussed the assessment and treatment plan with the patient. The patient was provided an opportunity to ask questions and all were answered. The patient agreed with the plan and demonstrated an understanding of the instructions.   The patient was advised to call back or seek an in-person evaluation if the symptoms worsen or if the condition fails to improve as anticipated.  I provided 21 minutes of non-face-to-face time during this encounter.  Elsie Stain, MD

## 2018-12-31 DIAGNOSIS — I714 Abdominal aortic aneurysm, without rupture, unspecified: Secondary | ICD-10-CM | POA: Insufficient documentation

## 2018-12-31 NOTE — Assessment & Plan Note (Signed)
See above

## 2018-12-31 NOTE — Telephone Encounter (Signed)
Already addressed.  See orders.  Thanks.

## 2019-01-13 ENCOUNTER — Other Ambulatory Visit: Payer: Self-pay | Admitting: Family Medicine

## 2019-01-13 NOTE — Telephone Encounter (Signed)
Electronic refill request Alprazolam Last office visit 12/30/18 Last refill 09/26/18 #30/2

## 2019-01-15 NOTE — Telephone Encounter (Signed)
Pt left a VM on triage line asking why this medication has not been sent to the pharmacy. I will send this to Dr Darnell Level

## 2019-01-16 NOTE — Telephone Encounter (Signed)
Eprescribed.

## 2019-01-18 NOTE — Telephone Encounter (Signed)
Noted. Thanks.

## 2019-01-26 ENCOUNTER — Telehealth: Payer: Self-pay

## 2019-01-26 NOTE — Telephone Encounter (Signed)
Pt said she has been on CA med for couple of months and for 1 wk pt has had a severe S/T; it hurts badly when she swallows; throat is not scratchy but very sore.Pt wants to know if CA med could cause a bad S/T. Pt has gargled with salt water, eaten cold things and hot food with no relief of pain at all. No fever, chills,not coughing as much as usual since has COPD. SOB is also due to COPD pt thinks. No muscle pain,diarrhea or H/A. Pt also has lost sense of smell in last month. No travel and no known exposure to + covid. I was going to ask Dr Damita Dunnings if pt needed to come to office or not due to possible strep test but pt said no she wanted virtual because she does not want to take a chance of catching covid. Pt scheduled virtual appt on 01/27/19 at 8 AM with Dr Damita Dunnings. FYI to Dr Damita Dunnings.

## 2019-01-27 ENCOUNTER — Ambulatory Visit (INDEPENDENT_AMBULATORY_CARE_PROVIDER_SITE_OTHER): Payer: Medicare Other | Admitting: Family Medicine

## 2019-01-27 ENCOUNTER — Other Ambulatory Visit: Payer: Self-pay

## 2019-01-27 DIAGNOSIS — K219 Gastro-esophageal reflux disease without esophagitis: Secondary | ICD-10-CM

## 2019-01-27 DIAGNOSIS — G4700 Insomnia, unspecified: Secondary | ICD-10-CM

## 2019-01-27 MED ORDER — TRAZODONE HCL 50 MG PO TABS: 150.0000 mg | ORAL_TABLET | Freq: Every evening | ORAL | Status: DC | PRN

## 2019-01-27 MED ORDER — TRAZODONE HCL 50 MG PO TABS: 150.0000 mg | ORAL_TABLET | Freq: Every evening | ORAL | 6 refills | Status: DC | PRN

## 2019-01-27 MED ORDER — OMEPRAZOLE 20 MG PO CPDR: 20.0000 mg | DELAYED_RELEASE_CAPSULE | Freq: Two times a day (BID) | ORAL | 3 refills | Status: DC

## 2019-01-27 NOTE — Telephone Encounter (Signed)
Noted. Thanks.

## 2019-01-27 NOTE — Progress Notes (Signed)
Virtual visit completed through WebEx or similar program Patient location: home  Provider location: Financial controller at [facility], office   Pandemic considerations d/w pt.   Limitations and rationale for visit method d/w patient.  Patient agreed to proceed.   CC: ST.    HPI:  Episodic ST.  Pain with swallowing.  Started about 10 days ago.  No fevers.  Cough isn't worse than baseline, dry.  Limited social contact.  Still on O2.  Rhinorrhea.  some ear pain recently.  She has some atypical chest pain about 30 min after eating, at rest, that eventually gets better with tums.  She has a change in sense of smell that has been going on for about 1 year.    Patient is on anastrozole, med can cause pharyngitis 6% to 14% of the time, but sx didn't start with med start.    She isn't sleeping as well as prev, still on trazodone 100mg  at night.    This didn't feel like prev episodes of strep throat.    She is taking omeprazole 20mg  a day.    Meds and allergies reviewed.   ROS: Per HPI unless specifically indicated in ROS section   NAD Speech wnl On O2 at baseline.  OP w/o exudates in the OP.  Voice is slightly hoarse.    A/P:   Pandemic considerations d/w pt.   Possible GERD related ST and d/w pt about taking omeprazole 40mg  a day.  She agrees.  She is going to continue anastrozole.    D/w pt about trying 150mg  trazodone at night.    She'll update me as needed.

## 2019-01-28 NOTE — Assessment & Plan Note (Signed)
  Possible GERD related ST and d/w pt about taking omeprazole 40mg  a day.  She agrees.  She is going to continue anastrozole.   She will update me as needed.

## 2019-01-28 NOTE — Assessment & Plan Note (Signed)
She can try 150 mg of trazodone at night and then update me as needed.  Routine cautions given.  She agrees.

## 2019-01-29 ENCOUNTER — Inpatient Hospital Stay (HOSPITAL_COMMUNITY)
Admission: EM | Disposition: A | Discharge status: Home / Self Care | Payer: Self-pay | Source: Home / Self Care | Attending: Interventional Cardiology

## 2019-01-29 ENCOUNTER — Telehealth: Payer: Self-pay

## 2019-01-29 ENCOUNTER — Inpatient Hospital Stay (HOSPITAL_COMMUNITY): Payer: Medicare Other

## 2019-01-29 ENCOUNTER — Other Ambulatory Visit: Payer: Self-pay

## 2019-01-29 ENCOUNTER — Inpatient Hospital Stay (HOSPITAL_COMMUNITY)
Admission: EM | Admit: 2019-01-29 | Discharge: 2019-02-01 | DRG: 247 | Disposition: A | Discharge status: Home / Self Care | Payer: Medicare Other | Attending: Interventional Cardiology | Admitting: Interventional Cardiology

## 2019-01-29 ENCOUNTER — Ambulatory Visit (HOSPITAL_COMMUNITY): Admit: 2019-01-29 | Payer: Medicare Other | Admitting: Interventional Cardiology

## 2019-01-29 DIAGNOSIS — Z8673 Personal history of transient ischemic attack (TIA), and cerebral infarction without residual deficits: Secondary | ICD-10-CM | POA: Diagnosis not present

## 2019-01-29 DIAGNOSIS — M810 Age-related osteoporosis without current pathological fracture: Secondary | ICD-10-CM | POA: Diagnosis present

## 2019-01-29 DIAGNOSIS — Z79811 Long term (current) use of aromatase inhibitors: Secondary | ICD-10-CM

## 2019-01-29 DIAGNOSIS — J449 Chronic obstructive pulmonary disease, unspecified: Secondary | ICD-10-CM | POA: Diagnosis present

## 2019-01-29 DIAGNOSIS — Z955 Presence of coronary angioplasty implant and graft: Secondary | ICD-10-CM

## 2019-01-29 DIAGNOSIS — I340 Nonrheumatic mitral (valve) insufficiency: Secondary | ICD-10-CM | POA: Diagnosis not present

## 2019-01-29 DIAGNOSIS — Z853 Personal history of malignant neoplasm of breast: Secondary | ICD-10-CM

## 2019-01-29 DIAGNOSIS — I25110 Atherosclerotic heart disease of native coronary artery with unstable angina pectoris: Secondary | ICD-10-CM | POA: Diagnosis present

## 2019-01-29 DIAGNOSIS — J9610 Chronic respiratory failure, unspecified whether with hypoxia or hypercapnia: Secondary | ICD-10-CM | POA: Diagnosis present

## 2019-01-29 DIAGNOSIS — I361 Nonrheumatic tricuspid (valve) insufficiency: Secondary | ICD-10-CM

## 2019-01-29 DIAGNOSIS — Z170 Estrogen receptor positive status [ER+]: Secondary | ICD-10-CM | POA: Diagnosis not present

## 2019-01-29 DIAGNOSIS — Z79890 Hormone replacement therapy (postmenopausal): Secondary | ICD-10-CM

## 2019-01-29 DIAGNOSIS — Z791 Long term (current) use of non-steroidal anti-inflammatories (NSAID): Secondary | ICD-10-CM

## 2019-01-29 DIAGNOSIS — I10 Essential (primary) hypertension: Secondary | ICD-10-CM | POA: Diagnosis not present

## 2019-01-29 DIAGNOSIS — Z20828 Contact with and (suspected) exposure to other viral communicable diseases: Secondary | ICD-10-CM | POA: Diagnosis present

## 2019-01-29 DIAGNOSIS — I255 Ischemic cardiomyopathy: Secondary | ICD-10-CM | POA: Diagnosis present

## 2019-01-29 DIAGNOSIS — I2510 Atherosclerotic heart disease of native coronary artery without angina pectoris: Secondary | ICD-10-CM | POA: Diagnosis present

## 2019-01-29 DIAGNOSIS — R0789 Other chest pain: Secondary | ICD-10-CM | POA: Diagnosis not present

## 2019-01-29 DIAGNOSIS — K219 Gastro-esophageal reflux disease without esophagitis: Secondary | ICD-10-CM | POA: Diagnosis present

## 2019-01-29 DIAGNOSIS — C50411 Malignant neoplasm of upper-outer quadrant of right female breast: Secondary | ICD-10-CM

## 2019-01-29 DIAGNOSIS — I2119 ST elevation (STEMI) myocardial infarction involving other coronary artery of inferior wall: Principal | ICD-10-CM | POA: Diagnosis present

## 2019-01-29 DIAGNOSIS — Z9981 Dependence on supplemental oxygen: Secondary | ICD-10-CM

## 2019-01-29 DIAGNOSIS — E039 Hypothyroidism, unspecified: Secondary | ICD-10-CM | POA: Diagnosis present

## 2019-01-29 DIAGNOSIS — Z7951 Long term (current) use of inhaled steroids: Secondary | ICD-10-CM | POA: Diagnosis not present

## 2019-01-29 DIAGNOSIS — J9611 Chronic respiratory failure with hypoxia: Secondary | ICD-10-CM | POA: Diagnosis present

## 2019-01-29 DIAGNOSIS — E785 Hyperlipidemia, unspecified: Secondary | ICD-10-CM | POA: Diagnosis present

## 2019-01-29 DIAGNOSIS — I712 Thoracic aortic aneurysm, without rupture, unspecified: Secondary | ICD-10-CM | POA: Diagnosis present

## 2019-01-29 DIAGNOSIS — Z87891 Personal history of nicotine dependence: Secondary | ICD-10-CM

## 2019-01-29 DIAGNOSIS — I714 Abdominal aortic aneurysm, without rupture, unspecified: Secondary | ICD-10-CM | POA: Diagnosis present

## 2019-01-29 DIAGNOSIS — I129 Hypertensive chronic kidney disease with stage 1 through stage 4 chronic kidney disease, or unspecified chronic kidney disease: Secondary | ICD-10-CM | POA: Diagnosis present

## 2019-01-29 DIAGNOSIS — Z79899 Other long term (current) drug therapy: Secondary | ICD-10-CM | POA: Diagnosis not present

## 2019-01-29 DIAGNOSIS — I2111 ST elevation (STEMI) myocardial infarction involving right coronary artery: Secondary | ICD-10-CM | POA: Diagnosis not present

## 2019-01-29 DIAGNOSIS — E782 Mixed hyperlipidemia: Secondary | ICD-10-CM | POA: Diagnosis present

## 2019-01-29 HISTORY — DX: ST elevation (STEMI) myocardial infarction involving other coronary artery of inferior wall: I21.19

## 2019-01-29 HISTORY — PX: CORONARY/GRAFT ACUTE MI REVASCULARIZATION: CATH118305

## 2019-01-29 LAB — POCT I-STAT, CHEM 8
BUN: 11 mg/dL (ref 8–23)
Calcium, Ion: 1.28 mmol/L (ref 1.15–1.40)
Chloride: 101 mmol/L (ref 98–111)
Creatinine, Ser: 0.5 mg/dL (ref 0.44–1.00)
Glucose, Bld: 151 mg/dL — ABNORMAL HIGH (ref 70–99)
HCT: 33 % — ABNORMAL LOW (ref 36.0–46.0)
Hemoglobin: 11.2 g/dL — ABNORMAL LOW (ref 12.0–15.0)
Potassium: 3.5 mmol/L (ref 3.5–5.1)
Sodium: 141 mmol/L (ref 135–145)
TCO2: 34 mmol/L — ABNORMAL HIGH (ref 22–32)

## 2019-01-29 LAB — CBC
HCT: 56.1 % — ABNORMAL HIGH (ref 36.0–46.0)
Hemoglobin: 18.6 g/dL — ABNORMAL HIGH (ref 12.0–15.0)
MCH: 32.4 pg (ref 26.0–34.0)
MCHC: 33.2 g/dL (ref 30.0–36.0)
MCV: 97.7 fL (ref 80.0–100.0)
Platelets: 80 10*3/uL — ABNORMAL LOW (ref 150–400)
RBC: 5.74 MIL/uL — ABNORMAL HIGH (ref 3.87–5.11)
RDW: 12.9 % (ref 11.5–15.5)
WBC: 2.9 10*3/uL — ABNORMAL LOW (ref 4.0–10.5)
nRBC: 0 % (ref 0.0–0.2)

## 2019-01-29 LAB — COMPREHENSIVE METABOLIC PANEL
ALT: 12 U/L (ref 0–44)
AST: 26 U/L (ref 15–41)
Albumin: 3.4 g/dL — ABNORMAL LOW (ref 3.5–5.0)
Alkaline Phosphatase: 37 U/L — ABNORMAL LOW (ref 38–126)
Anion gap: 8 (ref 5–15)
BUN: 10 mg/dL (ref 8–23)
CO2: 32 mmol/L (ref 22–32)
Calcium: 9.1 mg/dL (ref 8.9–10.3)
Chloride: 103 mmol/L (ref 98–111)
Creatinine, Ser: 0.45 mg/dL (ref 0.44–1.00)
GFR calc Af Amer: >60 mL/min (ref >60)
GFR calc non Af Amer: >60 mL/min (ref >60)
Glucose, Bld: 148 mg/dL — ABNORMAL HIGH (ref 70–99)
Potassium: 3.4 mmol/L — ABNORMAL LOW (ref 3.5–5.1)
Sodium: 143 mmol/L (ref 135–145)
Total Bilirubin: 0.5 mg/dL (ref 0.3–1.2)
Total Protein: 6.5 g/dL (ref 6.5–8.1)

## 2019-01-29 LAB — APTT: aPTT: 33 seconds (ref 24–36)

## 2019-01-29 LAB — SARS CORONAVIRUS 2 BY RT PCR (HOSPITAL ORDER, PERFORMED IN ~~LOC~~ HOSPITAL LAB): SARS Coronavirus 2: NEGATIVE

## 2019-01-29 LAB — POCT ACTIVATED CLOTTING TIME: Activated Clotting Time: 483 seconds

## 2019-01-29 LAB — LIPID PANEL
Cholesterol: 207 mg/dL — ABNORMAL HIGH (ref 0–200)
HDL: 44 mg/dL (ref >40)
LDL Cholesterol: 141 mg/dL — ABNORMAL HIGH (ref 0–99)
Total CHOL/HDL Ratio: 4.7 RATIO
Triglycerides: 108 mg/dL (ref <150)
VLDL: 22 mg/dL (ref 0–40)

## 2019-01-29 LAB — TROPONIN I (HIGH SENSITIVITY): Troponin I (High Sensitivity): 82 ng/L — ABNORMAL HIGH (ref <18)

## 2019-01-29 LAB — HEMOGLOBIN A1C
Hgb A1c MFr Bld: 5.6 % (ref 4.8–5.6)
Mean Plasma Glucose: 114.02 mg/dL

## 2019-01-29 LAB — ECHOCARDIOGRAM COMPLETE

## 2019-01-29 LAB — PROTIME-INR
INR: 1.1 (ref 0.8–1.2)
Prothrombin Time: 14 seconds (ref 11.4–15.2)

## 2019-01-29 LAB — MRSA PCR SCREENING: MRSA by PCR: NEGATIVE

## 2019-01-29 SURGERY — CORONARY/GRAFT ACUTE MI REVASCULARIZATION
Anesthesia: LOCAL

## 2019-01-29 MED ORDER — TRAZODONE HCL 50 MG PO TABS
150.0000 mg | ORAL_TABLET | Freq: Every evening | ORAL | Status: DC | PRN
  Administered 2019-01-29 – 2019-01-31 (×3): 150 mg via ORAL
  Filled 2019-01-29 (×2): qty 3
  Filled 2019-01-29: qty 1

## 2019-01-29 MED ORDER — LIDOCAINE HCL (PF) 1 % IJ SOLN
INTRAMUSCULAR | Status: AC
  Filled 2019-01-29: qty 30

## 2019-01-29 MED ORDER — ATROPINE SULFATE 1 MG/10ML IJ SOSY
PREFILLED_SYRINGE | INTRAMUSCULAR | Status: AC
  Filled 2019-01-29: qty 10

## 2019-01-29 MED ORDER — ORAL CARE MOUTH RINSE
15.0000 mL | Freq: Two times a day (BID) | OROMUCOSAL | Status: DC
  Administered 2019-01-29 – 2019-02-01 (×5): 15 mL via OROMUCOSAL

## 2019-01-29 MED ORDER — TIROFIBAN HCL IN NACL 5-0.9 MG/100ML-% IV SOLN
INTRAVENOUS | Status: AC
  Filled 2019-01-29: qty 100

## 2019-01-29 MED ORDER — ONDANSETRON HCL 4 MG/2ML IJ SOLN
INTRAMUSCULAR | Status: DC | PRN
  Administered 2019-01-29: 4 mg via INTRAVENOUS

## 2019-01-29 MED ORDER — DULOXETINE HCL 60 MG PO CPEP
60.0000 mg | ORAL_CAPSULE | Freq: Every day | ORAL | Status: DC
  Administered 2019-01-30 – 2019-02-01 (×3): 60 mg via ORAL
  Filled 2019-01-29 (×3): qty 1

## 2019-01-29 MED ORDER — CANGRELOR TETRASODIUM 50 MG IV SOLR
INTRAVENOUS | Status: AC
  Filled 2019-01-29: qty 50

## 2019-01-29 MED ORDER — ONDANSETRON HCL 4 MG/2ML IJ SOLN
INTRAMUSCULAR | Status: AC
  Filled 2019-01-29: qty 2

## 2019-01-29 MED ORDER — ATORVASTATIN CALCIUM 80 MG PO TABS
80.0000 mg | ORAL_TABLET | Freq: Every day | ORAL | Status: DC
  Administered 2019-01-29 – 2019-01-31 (×3): 80 mg via ORAL
  Filled 2019-01-29 (×3): qty 1

## 2019-01-29 MED ORDER — SODIUM CHLORIDE 0.9% FLUSH: 3.0000 mL | INTRAVENOUS | Status: DC | PRN

## 2019-01-29 MED ORDER — LEVOTHYROXINE SODIUM 112 MCG PO TABS
112.0000 ug | ORAL_TABLET | Freq: Every day | ORAL | Status: DC
  Administered 2019-01-30 – 2019-02-01 (×3): 112 ug via ORAL
  Filled 2019-01-29 (×3): qty 1

## 2019-01-29 MED ORDER — SODIUM CHLORIDE 0.9 % IV SOLN: 4.0000 ug/kg/min | INTRAVENOUS | Status: DC

## 2019-01-29 MED ORDER — HYDRALAZINE HCL 20 MG/ML IJ SOLN: 10.0000 mg | INTRAMUSCULAR | Status: AC | PRN

## 2019-01-29 MED ORDER — LIDOCAINE HCL (PF) 1 % IJ SOLN
INTRAMUSCULAR | Status: DC | PRN
  Administered 2019-01-29: 2 mL

## 2019-01-29 MED ORDER — DOCUSATE SODIUM 100 MG PO CAPS
100.0000 mg | ORAL_CAPSULE | Freq: Every day | ORAL | Status: DC | PRN
  Filled 2019-01-29: qty 1

## 2019-01-29 MED ORDER — ASPIRIN 81 MG PO CHEW
81.0000 mg | CHEWABLE_TABLET | Freq: Every day | ORAL | Status: DC
  Administered 2019-01-30 – 2019-02-01 (×3): 81 mg via ORAL
  Filled 2019-01-29 (×3): qty 1

## 2019-01-29 MED ORDER — HEPARIN SODIUM (PORCINE) 1000 UNIT/ML IJ SOLN
INTRAMUSCULAR | Status: AC
  Filled 2019-01-29: qty 1

## 2019-01-29 MED ORDER — SODIUM CHLORIDE 0.9 % IV SOLN: INTRAVENOUS | Status: DC

## 2019-01-29 MED ORDER — VERAPAMIL HCL 2.5 MG/ML IV SOLN
INTRAVENOUS | Status: DC | PRN
  Administered 2019-01-29: 10 mL via INTRA_ARTERIAL

## 2019-01-29 MED ORDER — CANGRELOR BOLUS VIA INFUSION
INTRAVENOUS | Status: DC | PRN
  Administered 2019-01-29: 1830 ug via INTRAVENOUS

## 2019-01-29 MED ORDER — ALPRAZOLAM 0.25 MG PO TABS
0.2500 mg | ORAL_TABLET | Freq: Two times a day (BID) | ORAL | Status: DC | PRN
  Administered 2019-01-30 – 2019-02-01 (×5): 0.25 mg via ORAL
  Filled 2019-01-29 (×5): qty 1

## 2019-01-29 MED ORDER — ACETAMINOPHEN 325 MG PO TABS
650.0000 mg | ORAL_TABLET | ORAL | Status: DC | PRN
  Administered 2019-01-29 – 2019-01-30 (×2): 650 mg via ORAL
  Filled 2019-01-29 (×2): qty 2

## 2019-01-29 MED ORDER — LABETALOL HCL 5 MG/ML IV SOLN: 10.0000 mg | INTRAVENOUS | Status: AC | PRN

## 2019-01-29 MED ORDER — HEPARIN (PORCINE) IN NACL 1000-0.9 UT/500ML-% IV SOLN
INTRAVENOUS | Status: DC | PRN
  Administered 2019-01-29 (×2): 500 mL

## 2019-01-29 MED ORDER — IOHEXOL 350 MG/ML SOLN
INTRAVENOUS | Status: DC | PRN
  Administered 2019-01-29: 125 mL via INTRACARDIAC

## 2019-01-29 MED ORDER — HEPARIN SODIUM (PORCINE) 5000 UNIT/ML IJ SOLN
5000.0000 [IU] | Freq: Three times a day (TID) | INTRAMUSCULAR | Status: DC
  Administered 2019-01-29 – 2019-02-01 (×8): 5000 [IU] via SUBCUTANEOUS
  Filled 2019-01-29 (×8): qty 1

## 2019-01-29 MED ORDER — VERAPAMIL HCL 2.5 MG/ML IV SOLN
INTRAVENOUS | Status: AC
  Filled 2019-01-29: qty 2

## 2019-01-29 MED ORDER — ONDANSETRON HCL 4 MG/2ML IJ SOLN: 4.0000 mg | Freq: Four times a day (QID) | INTRAMUSCULAR | Status: DC | PRN

## 2019-01-29 MED ORDER — TIROFIBAN (AGGRASTAT) BOLUS VIA INFUSION
INTRAVENOUS | Status: DC | PRN
  Administered 2019-01-29: 1525 ug via INTRAVENOUS

## 2019-01-29 MED ORDER — SODIUM CHLORIDE 0.9 % IV SOLN
INTRAVENOUS | Status: AC | PRN
  Administered 2019-01-29: 4 ug/kg/min via INTRAVENOUS

## 2019-01-29 MED ORDER — TICAGRELOR 90 MG PO TABS
90.0000 mg | ORAL_TABLET | Freq: Two times a day (BID) | ORAL | Status: DC
  Administered 2019-01-29 – 2019-02-01 (×6): 90 mg via ORAL
  Filled 2019-01-29 (×6): qty 1

## 2019-01-29 MED ORDER — SODIUM CHLORIDE 0.9 % IV SOLN
INTRAVENOUS | Status: AC | PRN
  Administered 2019-01-29: 250 mL/h via INTRAVENOUS

## 2019-01-29 MED ORDER — ANASTROZOLE 1 MG PO TABS
1.0000 mg | ORAL_TABLET | Freq: Every day | ORAL | Status: DC
  Administered 2019-01-30 – 2019-02-01 (×3): 1 mg via ORAL
  Filled 2019-01-29 (×3): qty 1

## 2019-01-29 MED ORDER — SODIUM CHLORIDE 0.9% FLUSH
3.0000 mL | Freq: Two times a day (BID) | INTRAVENOUS | Status: DC
  Administered 2019-01-30 – 2019-01-31 (×4): 3 mL via INTRAVENOUS

## 2019-01-29 MED ORDER — SODIUM CHLORIDE 0.9 % IV SOLN
4.0000 ug/kg/min | INTRAVENOUS | Status: DC
  Filled 2019-01-29: qty 50

## 2019-01-29 MED ORDER — HEPARIN (PORCINE) IN NACL 1000-0.9 UT/500ML-% IV SOLN
INTRAVENOUS | Status: AC
  Filled 2019-01-29: qty 1000

## 2019-01-29 MED ORDER — SODIUM CHLORIDE 0.9 % IV SOLN: 250.0000 mL | INTRAVENOUS | Status: DC | PRN

## 2019-01-29 MED ORDER — HEPARIN SODIUM (PORCINE) 1000 UNIT/ML IJ SOLN
INTRAMUSCULAR | Status: DC | PRN
  Administered 2019-01-29: 10000 [IU] via INTRAVENOUS

## 2019-01-29 MED ORDER — NITROGLYCERIN 1 MG/10 ML FOR IR/CATH LAB
INTRA_ARTERIAL | Status: AC
  Filled 2019-01-29: qty 10

## 2019-01-29 MED ORDER — TICAGRELOR 90 MG PO TABS
ORAL_TABLET | ORAL | Status: DC | PRN
  Administered 2019-01-29: 180 mg via ORAL

## 2019-01-29 MED ORDER — ATROPINE SULFATE 1 MG/10ML IJ SOSY
PREFILLED_SYRINGE | INTRAMUSCULAR | Status: DC | PRN
  Administered 2019-01-29: 0.5 mg via INTRAVENOUS

## 2019-01-29 SURGICAL SUPPLY — 19 items
BALLN SAPPHIRE 2.5X12 (BALLOONS) ×2
BALLN SAPPHIRE 3.0X15 (BALLOONS) ×2
BALLOON SAPPHIRE 2.5X12 (BALLOONS) ×1 IMPLANT
BALLOON SAPPHIRE 3.0X15 (BALLOONS) ×1 IMPLANT
CATH 5FR JL3.5 JR4 ANG PIG MP (CATHETERS) ×2 IMPLANT
CATH INFINITI 5FR JL4 (CATHETERS) ×2 IMPLANT
CATH LAUNCHER 6FR JR4 (CATHETERS) ×2 IMPLANT
DEVICE RAD COMP TR BAND LRG (VASCULAR PRODUCTS) ×2 IMPLANT
GLIDESHEATH SLEND A-KIT 6F 22G (SHEATH) ×2 IMPLANT
GUIDEWIRE INQWIRE 1.5J.035X260 (WIRE) ×1 IMPLANT
INQWIRE 1.5J .035X260CM (WIRE) ×2
KIT ENCORE 26 ADVANTAGE (KITS) ×2 IMPLANT
KIT HEART LEFT (KITS) ×2 IMPLANT
PACK CARDIAC CATHETERIZATION (CUSTOM PROCEDURE TRAY) ×2 IMPLANT
SHEATH PROBE COVER 6X72 (BAG) ×2 IMPLANT
STENT RESOLUTE ONYX 3.5X22 (Permanent Stent) ×2 IMPLANT
TRANSDUCER W/STOPCOCK (MISCELLANEOUS) ×2 IMPLANT
TUBING CIL FLEX 10 FLL-RA (TUBING) ×2 IMPLANT
WIRE ASAHI PROWATER 180CM (WIRE) ×2 IMPLANT

## 2019-01-29 NOTE — Care Management (Signed)
Brilinta benefits check sent and pending.  Jannine Abreu RN, BSN, NCM-BC, ACM-RN [phone] 

## 2019-01-29 NOTE — TOC Benefit Eligibility Note (Signed)
Transition of Care [facility]) Benefit Eligibility Note    Patient Details  Name: [name] MRN: [id number] Date of Birth: June 25, 1931   Medication/Dose: BRILINTA   90 MG BID  Covered?: Yes  Tier: 2 Drug  Prescription Coverage Preferred Pharmacy: [facility]    90 DAY SUPPLY FOR M/O $80.00  Spoke with Person/Company/Phone Number:: DORIE  @  [employer] # [phone]  Co-Pay: $ 40.00  Prior Approval: No  Deductible: Met  Additional Notes: TICAGRELOR -Crecencio Mc Phone Number: 01/29/2019, 4:28 PM

## 2019-01-29 NOTE — H&P (Addendum)
The patient has been seen in conjunction with Reino Bellis, NP. All aspects of care have been considered and discussed. The patient has been personally interviewed, examined, and all clinical data has been reviewed.   85 year old patient with a 2 to 3-week history of "indigestion".  The indigestion discomfort awaken her this morning at 6:30 AM and was described as a pressure in the chest radiating into the arm that would not resolve.  Eventually her daughter called emergency medical services after the discomfort had been present for nearly 5 hours.  EMS transmitted EKG demonstrated inferolateral ST elevation consistent with myocardial infarction.  She was brought straight to the Cath Lab where history was obtained, examination was performed, EKG was reviewed, and other clinical data scrutinized.  The patient is able to lie flat.  No significant neck vein elevation is noted.  An S4 gallop was heard on auscultation.  Radial pulses are 2+ and symmetric.  Pedal pulses are 2+ and symmetric bilateral posterior tibial.  Ongoing moderate to severe chest discomfort on arrival in the Cath Lab.  Impression acute inferolateral ST elevation myocardial infarction, late presenting today with ongoing ischemic pain.  Emergency catheterization and mechanical reperfusion if indicated.  Critical care time 35 min  Cardiology Admission History and Physical:   Patient ID: [name] MRN: [id number]; DOB: 03-13-1931   Admission date: 01/29/2019  Primary Care Provider: Tonia Ghent, MD Primary Cardiologist: No primary care provider on file.  Primary Electrophysiologist:  None   Chief Complaint:  Chest pain  Patient Profile:   [name] is a 85 y.o. female with PMH of TIA, HTN, Asthma, Breast Ca, HL who presented with ongoing chest pain.   History of Present Illness:   [name] is a 85 yo female with PMH noted above. She had never seen a cardiologist in the past. Does report a hx of a  heart murmur. Currently being followed by Dr. Jana Hakim at the [facility]. Underwent a right breast biopsy on 09/05/18 which was + for invasive ductal carcinoma. Currently being treated with anastrozole which was started on 10/27/18. Last visit was virtual on 11/20/18 and reported doing well overall. Planned for follow up September after follow up US in august.  She has been in usual state of health until about 2 weeks prior. Currently lives at home alone, and daughter helps with most of her household chores and shopping. She has not been out much as a result of the pandemic. Developed a burning sensation in her chest and was attempting to treat with TUMS. Symptoms waxed and waned over the past 2 weeks. Had a virtual visit with her PCP back on 01/27/19 and Protonix was increased to BID. She do not have much symptom relief with this change. This morning around 6:30am she developed worsening chest burning with radiation into the left jaw and arm. Called PCP office with symptoms and was instructed to called EMS or go to the ED for further evaluation. Called EMS, on scene EKG showed anterior ST elevation. CODE STEMI was called in the field and she was brought to the cath lab for emergent cath.   Heart Pathway Score:     Past Medical History:  Diagnosis Date  . Allergic rhinitis, cause unspecified   . Allergy, unspecified not elsewhere classified   . Anxiety   . Asthma   . Chronic kidney disease    frequency  . COPD (chronic obstructive pulmonary disease) ([place])   . Degeneration of intervertebral disc,  site unspecified   . Diverticulosis of colon (without mention of hemorrhage)   . Esophageal reflux   . Headache   . Insomnia, unspecified   . Myalgia and myositis, unspecified   . Need for prophylactic hormone replacement therapy (postmenopausal)   . Osteoarthritis of right knee 08/27/2011   R knee pain, s/p arthroscopy 2012 per [name]   . Osteoporosis, unspecified   . Other abnormal blood  chemistry   . Other and unspecified hyperlipidemia   . Other chronic pain   . Stroke [facility])    mini strokes  . Unspecified essential hypertension   . Unspecified hypothyroidism     Past Surgical History:  Procedure Laterality Date  . BACK SURGERY    . breast cystectomy    . JOINT REPLACEMENT     shoulder right  . KNEE ARTHROSCOPY     Right knee 2012  . MASS EXCISION Left 04/07/2015   Procedure: MINOR EXCISION OF MASS LEFT SMALL FINGER;  Surgeon: Leanora Cover, MD;  Location: [place];  Service: Orthopedics;  Laterality: Left;  . SHOULDER SURGERY    . TOTAL KNEE ARTHROPLASTY  12/11/2011   Procedure: TOTAL KNEE ARTHROPLASTY;  Surgeon: Johnny Bridge, MD;  Location: [place];  Service: Orthopedics;  Laterality: Right;  . UMBILICAL HERNIA REPAIR N/A 06/09/2013   Procedure: EXPLORATIORY LAPAROTOMY, HERNIA REPAIR UMBILICAL, INSERTION OF MESH;  Surgeon: Haywood Lasso, MD;  Location: [place];  Service: General;  Laterality: N/A;     Medications Prior to Admission: Prior to Admission medications   Medication Sig Start Date End Date Taking? Authorizing Provider  ALPHA LIPOIC ACID PO Take 1 tablet by mouth 2 (two) times a day.    [provider]  ALPRAZolam (XANAX) 1 MG tablet TAKE 1/2 TABLET BY MOUTH 2 TIMES DAILY AS NEEDED FOR ANXIETY. 01/16/19   Ria Bush, MD  amLODipine (NORVASC) 2.5 MG tablet TAKE 1 TABLET BY MOUTH DAILY. Patient taking differently: Take 2.5 mg by mouth daily.  11/11/17   Tonia Ghent, MD  anastrozole (ARIMIDEX) 1 MG tablet Take 1 tablet (1 mg total) by mouth daily. 11/20/18   Magrinat, Virgie Dad, MD  Calcium Carbonate (CALTRATE 600 PO) Take 600 mg by mouth daily.     [provider]  cholecalciferol (VITAMIN D) 1000 units tablet Take 1 tablet (1,000 Units total) by mouth daily. 09/25/16   Tonia Ghent, MD  diclofenac sodium (VOLTAREN) 1 % GEL APPLY 4 GRAMS TO AFFECTED AREA 2 TIMES A DAY. Patient taking differently: Apply 4 g  topically 2 (two) times daily as needed (pain).  08/26/17   Tonia Ghent, MD  docusate sodium (COLACE) 100 MG capsule Take 1 capsule (100 mg total) by mouth daily as needed for mild constipation. 12/30/18   Tonia Ghent, MD  DULoxetine (CYMBALTA) 60 MG capsule TAKE 1 CAPSULE BY MOUTH DAILY Patient taking differently: Take 60 mg by mouth daily.  08/21/18   Tonia Ghent, MD  ferrous sulfate (FERROUSUL) 325 (65 FE) MG tablet Take 1 tablet (325 mg total) by mouth daily with breakfast. 08/22/18   Tonia Ghent, MD  gabapentin (NEURONTIN) 300 MG capsule 2 tabs in the AM and 1 tab in the afternoon 12/30/18   Tonia Ghent, MD  hydroxypropyl methylcellulose / hypromellose (ISOPTO TEARS / GONIOVISC) 2.5 % ophthalmic solution Place 1 drop into both eyes 3 (three) times daily as needed for dry eyes.    [provider]  ibuprofen (ADVIL,MOTRIN) 200  MG tablet Take 3 tablets (600 mg total) by mouth 2 (two) times daily as needed for mild pain. With food 07/30/17   Tonia Ghent, MD  levothyroxine (SYNTHROID, LEVOTHROID) 112 MCG tablet TAKE 1 TABLET BY MOUTH DAILY BEFORE BREAKFAST. Patient taking differently: Take 112 mcg by mouth daily before breakfast.  09/25/18   Tonia Ghent, MD  Misc Natural Products (COLON CLEANSE) CAPS Take 1 capsule by mouth 2 (two) times a day.    [provider]  Multiple Vitamin (MULTIVITAMIN WITH MINERALS) TABS tablet Take 1 tablet by mouth daily.    [provider]  Multiple Vitamins-Minerals (OCUVITE PRESERVISION PO) Take 1 tablet by mouth daily.    [provider]  NON FORMULARY Place 2.5-3 L into the nose daily. OXYGEN    [provider]  omeprazole (PRILOSEC) 20 MG capsule Take 1 capsule (20 mg total) by mouth 2 (two) times daily before a meal. 01/27/19   Tonia Ghent, MD  tiotropium (SPIRIVA HANDIHALER) 18 MCG inhalation capsule PLACE 1 CAPSULE INTO INHALER AND INHALE ONCE A DAY Patient taking differently: Place 18  mcg into inhaler and inhale daily.  01/27/18   Tonia Ghent, MD  traZODone (DESYREL) 50 MG tablet Take 3 tablets (150 mg total) by mouth at bedtime as needed for sleep. 01/27/19   Tonia Ghent, MD  fluticasone [facility]) 50 MCG/ACT nasal spray Place 1-2 sprays into both nostrils daily as needed for allergies. Patient not taking: Reported on 12/27/2018 02/04/17 12/27/18  Tonia Ghent, MD  furosemide (LASIX) 20 MG tablet TAKE 1/2 TO 1 TABLET BY MOUTH EVERY MORNING AS NEEDED FOR SWELLING. USE SPARINGLY.  Hold it for one week from now. Patient not taking: Reported on 12/27/2018 02/01/17 12/27/18  Hosie Poisson, MD  potassium chloride (K-DUR) 10 MEQ tablet Take 1 tablet (10 mEq total) by mouth daily as needed (If taking lasix/furosemide). Patient not taking: Reported on 12/27/2018 02/04/17 12/27/18  Tonia Ghent, MD     Allergies:    Allergies  Allergen Reactions  . Doxycycline     GI upset  . Sulfadiazine     REACTION: Fever, achey  . Sulfa Antibiotics Rash    Social History:   Social History   Socioeconomic History  . Marital status: Married    Spouse name: Not on file  . Number of children: Not on file  . Years of education: Not on file  . Highest education level: Not on file  Occupational History  . Occupation: Housewife  Social Needs  . Financial resource strain: Not on file  . Food insecurity    Worry: Not on file    Inability: Not on file  . Transportation needs    Medical: Not on file    Non-medical: Not on file  Tobacco Use  . Smoking status: Former Smoker    Packs/day: 1.50    Years: 10.00    Pack years: 15.00    Types: Cigarettes    Quit date: 07/09/1985    Years since quitting: 33.5  . Smokeless tobacco: Never Used  . Tobacco comment: 1 1/2 ppd x 20 years  Substance and Sexual Activity  . Alcohol use: No    Alcohol/week: 0.0 standard drinks  . Drug use: No  . Sexual activity: Never  Lifestyle  . Physical activity    Days per week: Not on file     Minutes per session: Not on file  . Stress: Not on file  Relationships  .  Social Herbalist on phone: Not on file    Gets together: Not on file    Attends religious service: Not on file    Active member of club or organization: Not on file    Attends meetings of clubs or organizations: Not on file    Relationship status: Not on file  . Intimate partner violence    Fear of current or ex partner: Not on file    Emotionally abused: Not on file    Physically abused: Not on file    Forced sexual activity: Not on file  Other Topics Concern  . Not on file  Social History Narrative   Widowed 2019 after 5 years of marriage.      Family History:   The patient's family history includes Asthma in her sister; Heart disease in her father and mother; Hypertension in her father; Lung cancer in her brother.    ROS:  Please see the history of present illness.  All other ROS reviewed and negative.     Physical Exam/Data:   Vitals:   01/29/19 1242  SpO2: 96%   No intake or output data in the 24 hours ending 01/29/19 1346 Last 3 Weights 08/22/2018 03/24/2018 09/19/2017  Weight (lbs) 148 lb 1 oz 149 lb 4 oz 148 lb 4 oz  Weight (kg) 67.161 kg 67.699 kg 67.246 kg     There is no height or weight on file to calculate BMI.  General:  Well nourished, well developed, pleasant older WF, pale HEENT: normal Neck: no JVD Endocrine:  No thryomegaly Vascular: No carotid bruits Cardiac:  normal S1, S2; RRR; soft systolic murmur  Lungs:  clear to auscultation bilaterally, no wheezing, rhonchi or rales  Abd: soft, nontender, no hepatomegaly  Ext: no edema Musculoskeletal:  No deformities, BUE and BLE strength normal and equal Skin: warm and dry  Neuro:  CNs 2-12 intact, no focal abnormalities noted Psych:  Normal affect    EKG:  The ECG that was done 01/29/19 was personally reviewed and demonstrates SR with acute ST elevation in inferior leads.   Relevant CV Studies:  N/a   Laboratory  Data:  High Sensitivity Troponin:  No results for input(s): TROPONINIHS in the last 720 hours.    Cardiac EnzymesNo results for input(s): TROPONINI in the last 168 hours. No results for input(s): TROPIPOC in the last 168 hours.  ChemistryNo results for input(s): NA, K, CL, CO2, GLUCOSE, BUN, CREATININE, CALCIUM, GFRNONAA, GFRAA, ANIONGAP in the last 168 hours.  No results for input(s): PROT, ALBUMIN, AST, ALT, ALKPHOS, BILITOT in the last 168 hours. HematologyNo results for input(s): WBC, RBC, HGB, HCT, MCV, MCH, MCHC, RDW, PLT in the last 168 hours. BNPNo results for input(s): BNP, PROBNP in the last 168 hours.  DDimer No results for input(s): DDIMER in the last 168 hours.   Radiology/Studies:  No results found.  Assessment and Plan:   [name] is a 85 y.o. female with PMH of TIA, HTN, Asthma, Breast Ca, HL who presented with ongoing chest pain.   1. Acute inferior STEMI: symptoms intermittently for the past 2 weeks which worsened this morning at 6:30am with radiation in to the left jaw and arm. Brought directly to the cath for emergent cardiac cath. Further recommendations post cath  2. Right breast Ca: being followed at the [facility]. On Anastrozole   3. HL: Last LDL 9/19 was 124, does not appear she has been on a statin. Anticipate initiation  of high dose statin.   4. HTN: blood pressures were low on admission. Will hold home BP meds for now. Favor adding low dose BB if able to tolerate.  Severity of Illness: The appropriate patient status for this patient is INPATIENT. Inpatient status is judged to be reasonable and necessary in order to provide the required intensity of service to ensure the patient's safety. The patient's presenting symptoms, physical exam findings, and initial radiographic and laboratory data in the context of their chronic comorbidities is felt to place them at high risk for further clinical deterioration. Furthermore, it is not anticipated that the  patient will be medically stable for discharge from the hospital within 2 midnights of admission. The following factors support the patient status of inpatient.   " The patient's presenting symptoms include chest burning with radiation into the left jaw and shoulder. " The worrisome physical exam findings include stable exam, but pale. " The initial radiographic and laboratory data are worrisome because of EKG with inferior STEMI. " The chronic co-morbidities include HTN, HL and breast CA.   * I certify that at the point of admission it is my clinical judgment that the patient will require inpatient hospital care spanning beyond 2 midnights from the point of admission due to high intensity of service, high risk for further deterioration and high frequency of surveillance required.*    For questions or updates, please contact [place] Please consult [email or web address] for contact info under        Signed, Reino Bellis, NP  01/29/2019 1:46 PM

## 2019-01-29 NOTE — Telephone Encounter (Signed)
Pt had virtual visit with Dr Damita Dunnings 01-27-19. She has increased the omeprazole to twice a day as directed and is feeling worse. Pain is going from her left side of her chest up into her jaw and down her left arm. She is having some nausea as well. I advised the daughter that she should either call EMS or take her to the ER.   FYI to Dr Damita Dunnings.

## 2019-01-29 NOTE — Progress Notes (Signed)
  Echocardiogram 2D Echocardiogram has been performed.  Darlina Sicilian M 01/29/2019, 4:02 PM

## 2019-01-30 ENCOUNTER — Encounter (HOSPITAL_COMMUNITY): Payer: Self-pay | Admitting: Interventional Cardiology

## 2019-01-30 DIAGNOSIS — I10 Essential (primary) hypertension: Secondary | ICD-10-CM

## 2019-01-30 DIAGNOSIS — J9611 Chronic respiratory failure with hypoxia: Secondary | ICD-10-CM

## 2019-01-30 DIAGNOSIS — E039 Hypothyroidism, unspecified: Secondary | ICD-10-CM

## 2019-01-30 DIAGNOSIS — Z955 Presence of coronary angioplasty implant and graft: Secondary | ICD-10-CM

## 2019-01-30 DIAGNOSIS — I712 Thoracic aortic aneurysm, without rupture: Secondary | ICD-10-CM

## 2019-01-30 DIAGNOSIS — I255 Ischemic cardiomyopathy: Secondary | ICD-10-CM

## 2019-01-30 DIAGNOSIS — I25110 Atherosclerotic heart disease of native coronary artery with unstable angina pectoris: Secondary | ICD-10-CM

## 2019-01-30 DIAGNOSIS — I2510 Atherosclerotic heart disease of native coronary artery without angina pectoris: Secondary | ICD-10-CM | POA: Diagnosis present

## 2019-01-30 DIAGNOSIS — K219 Gastro-esophageal reflux disease without esophagitis: Secondary | ICD-10-CM

## 2019-01-30 DIAGNOSIS — R0789 Other chest pain: Secondary | ICD-10-CM

## 2019-01-30 HISTORY — DX: Presence of coronary angioplasty implant and graft: Z95.5

## 2019-01-30 LAB — CBC
HCT: 37.9 % (ref 36.0–46.0)
Hemoglobin: 11.9 g/dL — ABNORMAL LOW (ref 12.0–15.0)
MCH: 31.9 pg (ref 26.0–34.0)
MCHC: 31.4 g/dL (ref 30.0–36.0)
MCV: 101.6 fL — ABNORMAL HIGH (ref 80.0–100.0)
Platelets: 192 10*3/uL (ref 150–400)
RBC: 3.73 MIL/uL — ABNORMAL LOW (ref 3.87–5.11)
RDW: 13.2 % (ref 11.5–15.5)
WBC: 5.5 10*3/uL (ref 4.0–10.5)
nRBC: 0 % (ref 0.0–0.2)

## 2019-01-30 LAB — COMPREHENSIVE METABOLIC PANEL
ALT: 24 U/L (ref 0–44)
AST: 157 U/L — ABNORMAL HIGH (ref 15–41)
Albumin: 3.1 g/dL — ABNORMAL LOW (ref 3.5–5.0)
Alkaline Phosphatase: 35 U/L — ABNORMAL LOW (ref 38–126)
Anion gap: 8 (ref 5–15)
BUN: 8 mg/dL (ref 8–23)
CO2: 32 mmol/L (ref 22–32)
Calcium: 9.3 mg/dL (ref 8.9–10.3)
Chloride: 103 mmol/L (ref 98–111)
Creatinine, Ser: 0.53 mg/dL (ref 0.44–1.00)
GFR calc Af Amer: >60 mL/min (ref >60)
GFR calc non Af Amer: >60 mL/min (ref >60)
Glucose, Bld: 93 mg/dL (ref 70–99)
Potassium: 4 mmol/L (ref 3.5–5.1)
Sodium: 143 mmol/L (ref 135–145)
Total Bilirubin: 0.7 mg/dL (ref 0.3–1.2)
Total Protein: 5.8 g/dL — ABNORMAL LOW (ref 6.5–8.1)

## 2019-01-30 LAB — HEMOGLOBIN A1C
Hgb A1c MFr Bld: 5.6 % (ref 4.8–5.6)
Mean Plasma Glucose: 114.02 mg/dL

## 2019-01-30 MED ORDER — ASPIRIN 81 MG PO CHEW: 81.0000 mg | CHEWABLE_TABLET | Freq: Every day | ORAL | 0 refills | Status: DC

## 2019-01-30 MED ORDER — ATORVASTATIN CALCIUM 80 MG PO TABS: 80.0000 mg | ORAL_TABLET | Freq: Every day | ORAL | 0 refills | Status: DC

## 2019-01-30 MED ORDER — CHLORHEXIDINE GLUCONATE CLOTH 2 % EX PADS
6.0000 | MEDICATED_PAD | Freq: Every day | CUTANEOUS | Status: DC
  Administered 2019-01-30 – 2019-02-01 (×3): 6 via TOPICAL

## 2019-01-30 MED ORDER — BISOPROLOL FUMARATE 5 MG PO TABS
2.5000 mg | ORAL_TABLET | Freq: Every day | ORAL | Status: DC
  Administered 2019-01-30 – 2019-02-01 (×3): 2.5 mg via ORAL
  Filled 2019-01-30 (×3): qty 1

## 2019-01-30 MED ORDER — NITROGLYCERIN 0.4 MG SL SUBL: 0.4000 mg | SUBLINGUAL_TABLET | SUBLINGUAL | Status: DC | PRN

## 2019-01-30 MED ORDER — TICAGRELOR 90 MG PO TABS: 90.0000 mg | ORAL_TABLET | Freq: Two times a day (BID) | ORAL | 2 refills | Status: DC

## 2019-01-30 MED ORDER — PANTOPRAZOLE SODIUM 40 MG PO TBEC
40.0000 mg | DELAYED_RELEASE_TABLET | Freq: Every day | ORAL | Status: DC
  Administered 2019-01-30 – 2019-02-01 (×3): 40 mg via ORAL
  Filled 2019-01-30 (×3): qty 1

## 2019-01-30 MED ORDER — NITROGLYCERIN 0.4 MG SL SUBL: 0.4000 mg | SUBLINGUAL_TABLET | SUBLINGUAL | 12 refills | Status: DC | PRN

## 2019-01-30 MED ORDER — BISOPROLOL FUMARATE 5 MG PO TABS: 2.5000 mg | ORAL_TABLET | Freq: Every day | ORAL | 0 refills | Status: DC

## 2019-01-30 MED ORDER — POLYVINYL ALCOHOL 1.4 % OP SOLN
1.0000 [drp] | OPHTHALMIC | Status: DC | PRN
  Filled 2019-01-30: qty 15

## 2019-01-30 MED ORDER — ALUM & MAG HYDROXIDE-SIMETH 200-200-20 MG/5ML PO SUSP
30.0000 mL | ORAL | Status: DC | PRN
  Administered 2019-01-30: 30 mL via ORAL
  Filled 2019-01-30: qty 30

## 2019-01-30 MED FILL — BRILINTA 90 MG TABLET: 90 | 30 days supply | Qty: 60 | Fill #0

## 2019-01-30 MED FILL — ASPIRIN LOW DOSE 81 MG CHEW: 81 | 30 days supply | Qty: 30 | Fill #0

## 2019-01-30 MED FILL — BISOPROLOL FUMARATE 5 MG TA: 5 | 60 days supply | Qty: 30 | Fill #0

## 2019-01-30 MED FILL — NITROGLYCERIN 0.4 MG TAB SL: 0.4 | 8 days supply | Qty: 25 | Fill #0

## 2019-01-30 MED FILL — ATORVASTATIN CALCIUM 80 MG: 80 | 30 days supply | Qty: 30 | Fill #0

## 2019-01-30 MED FILL — Nitroglycerin IV Soln 100 MCG/ML in D5W: INTRA_ARTERIAL | Qty: 10 | Status: AC

## 2019-01-30 MED FILL — Tirofiban HCl in NaCl 0.9% IV Soln 5 MG/100ML (Base Equiv): INTRAVENOUS | Qty: 100 | Status: AC

## 2019-01-30 NOTE — Telephone Encounter (Signed)
Thank you.  I will await the inpatient notes.

## 2019-01-30 NOTE — Progress Notes (Addendum)
CARDIAC REHAB PHASE I   PRE:  Rate/Rhythm: 63 SR    BP: sitting 126/62    SaO2: 100 3L  MODE:  Ambulation: 220 ft   POST:  Rate/Rhythm: 64 SR    BP: sitting 108/81     SaO2: 96 4L  Pt quite impulsive. Needed reminders to wait until wires, etc were ready to ambulate. SaO2 difficult to access. Walked on 4L and SAO2 seemingly 96 while walking. Pt used RW, assist x1. No major c/o but needed reminders on how to stay safe while walking and where her room is. To recliner. Gave MI book and stent card but will need to discuss ed with daughter so will call her. Pt sts she has RW at home. She probably needs her daughter to stay with her at home at d/c. [place], ACSM 01/30/2019 12:22 PM   Ed done with daughter [name] by phone. Receptive. She will be with pt at home for most of the time. Sts pt has fallen previously. Suggest HHPT to ensure safety at home. Pt needs to use RW at home (normally doesn't). Will refer to [facility] however pt probably not interested. [id number]

## 2019-01-30 NOTE — Progress Notes (Signed)
EKG CRITICAL VALUE     12 lead EKG performed.  Critical value noted. Royce Macadamia, RN notified.   Asencion Gowda, CCT 01/30/2019 6:51 AM

## 2019-01-30 NOTE — Progress Notes (Signed)
Progress Note  Patient Name: [name] Date of Encounter: 01/30/2019  Primary Cardiologist:   New to Dr. Tamala Julian  Subjective   Somewhat short of breath today, but does not look to be in distress stress. Notes significant GERD today Had some chest pain last night not to the extent of what she had at the time of MI.  Inpatient Medications    Scheduled Meds: . anastrozole  1 mg Oral Daily  . aspirin  81 mg Oral Daily  . atorvastatin  80 mg Oral q1800  . bisoprolol  2.5 mg Oral Daily  . DULoxetine  60 mg Oral Daily  . heparin  5,000 Units Subcutaneous Q8H  . levothyroxine  112 mcg Oral Q0600  . mouth rinse  15 mL Mouth Rinse BID  . pantoprazole  40 mg Oral Daily  . sodium chloride flush  3 mL Intravenous Q12H  . ticagrelor  90 mg Oral BID   Continuous Infusions: . sodium chloride     PRN Meds: sodium chloride, acetaminophen, ALPRAZolam, docusate sodium, ondansetron (ZOFRAN) IV, sodium chloride flush, traZODone   Vital Signs    Vitals:   01/30/19 0400 01/30/19 0500 01/30/19 0600 01/30/19 0800  BP: 119/64 99/67 121/72 105/69  Pulse: 70 68 64 78  Resp: 14 (!) 26 (!) 21 15  Temp: 98.2 F (36.8 C)   98.8 F (37.1 C)  TempSrc: Oral   Oral  SpO2: 98% 96% 99% 98%    Intake/Output Summary (Last 24 hours) at 01/30/2019 0841 Last data filed at 01/30/2019 0800 Gross per 24 hour  Intake 1074.28 ml  Output 1350 ml  Net -275.72 ml   Last 3 Weights 08/22/2018 03/24/2018 09/19/2017  Weight (lbs) 148 lb 1 oz 149 lb 4 oz 148 lb 4 oz  Weight (kg) 67.161 kg 67.699 kg 67.246 kg      Telemetry    Mostly sinus rhythm with rates in the 70s.  Scattered PVCs.  Intermittent bursts of 3-7 beats NSVT via Ngetich is here- Personally Reviewed  ECG    Sinus rhythm rate 60s bpm.  Apparent evolving changes from inferior-for lateral STEMI with subtle inferior ST elevations in II, III and aVF with T wave inversions as well as T wave inversions in V4-V5.- Personally Reviewed  Physical  Exam   Physical Exam  Constitutional: She is oriented to person, place, and time. She appears well-developed and well-nourished. No distress.  Resting in chair - looks comfortable  HENT:  Head: Normocephalic and atraumatic.  Hard of hearing  Neck: Normal range of motion. Neck supple. No JVD present.  Cardiovascular: Normal rate, regular rhythm, S1 normal and S2 normal.  Occasional extrasystoles are present. PMI is not displaced. Exam reveals distant heart sounds and decreased pulses (Mildly decreased pedal pulses). Exam reveals no S4 and no friction rub.  Murmur (Soft 1/6 SEM at RUSB) heard. Pulmonary/Chest: Effort normal. No respiratory distress. She has no wheezes. She has no rales. She exhibits tenderness (Significant left lateral sternal chest pain on palpation).  Diminished breath sounds throughout.  In general poor air movement.  Abdominal: Soft. Bowel sounds are normal. She exhibits no distension. There is no abdominal tenderness. There is no rebound.  Musculoskeletal: Normal range of motion.        General: No edema.  Neurological: She is alert and oriented to person, place, and time.  Skin:  Pale, no rash/lesions  Psychiatric: She has a normal mood and affect. Her behavior is normal. Judgment and thought content normal.  Nursing note and vitals reviewed.    Labs    High Sensitivity Troponin:   Recent Labs  Lab 01/29/19 1252  TROPONINIHS 82*      Cardiac EnzymesNo results for input(s): TROPONINI in the last 168 hours. No results for input(s): TROPIPOC in the last 168 hours.   Chemistry Recent Labs  Lab 01/29/19 1249 01/29/19 1252 01/30/19 0240  NA 141 143 143  K 3.5 3.4* 4.0  CL 101 103 103  CO2  --  32 32  GLUCOSE 151* 148* 93  BUN _0 CREATININE 0.50 0.45 0.53  CALCIUM  --  9.1 9.3  PROT  --  6.5 5.8*  ALBUMIN  --  3.4* 3.1*  AST  --  26 157*  ALT  --  12 24  ALKPHOS  --  37* 35*  BILITOT  --  0.5 0.7  GFRNONAA  --  >60 >60  GFRAA  --  >60 >60   ANIONGAP  --  8 8     Hematology Recent Labs  Lab 01/29/19 1249 01/29/19 1252  WBC  --  2.9*  RBC  --  5.74*  HGB 11.2* 18.6*  HCT 33.0* 56.1*  MCV  --  97.7  MCH  --  32.4  MCHC  --  33.2  RDW  --  12.9  PLT  --  80*    BNPNo results for input(s): BNP, PROBNP in the last 168 hours.   DDimer No results for input(s): DDIMER in the last 168 hours.   Radiology    No results found.  Cardiac Studies    Cardiac CATH-PCI 01/29/2019 (personally reviewed): 100% thrombotic occlusion of dRCA (PCI Resolute Onyx DES 3.5 mm 22 mm), dLM 60%,mLAD 50% & dLAD 70% (Med Rx). OM1 70%. (Tortuous axillaty, subclavian /innominate artery - unable to advance catheter into LV).  TTE (Inferolateral STEMI) 01/29/2019: Mild-moderately reduced LV function with basal-mid inferior and inferolateral HK.  EF 40 and 45%.  Moderate MAC.  Aortic sclerosis with no stenosis.  Mild dilation of ascending aorta.  Patient Profile     85 y.o. female with a history of hypertension, hyperlipidemia, history of breast cancer (February 2020), ascending thoracic aortic aneurysm, chronic hypoxic respiratory failure on oxygen who presented with a two-week history of indigestion that on the morning of 01/29/2019 awoke her from sleep.  Lasted 5 minutes.  EMS contacted for ongoing symptoms and was found to have inferior lateral ST elevation MI.  She is brought to [facility] where she was found to have an S4 gallop on exam and ST elevations in the inferolateral leads.  She was taken emergently for cardiac catheterization revealing an occluded dRCA that was treated with DES stent. She was also found to have moderate to severe distal left main and distal LAD disease with plans to treat that medically.  Echo shows reduced EF with inferolateral hypokinesis.  Assessment & Plan    Principal Problem:   Acute ST elevation myocardial infarction (STEMI) of inferior wall (HCC) Active Problems:   Coronary artery disease involving  native coronary artery of native heart with unstable angina pectoris (HCC)   Presence of drug coated stent in right coronary artery   Left main coronary artery disease - ~50-60% dLM (&70% dLAD) -- Plan Med Rx   Ischemic cardiomyopathy   Hypothyroidism   Hyperlipidemia with target LDL less than 70   Chronic respiratory failure (HCC)   HTN (hypertension)   Thoracic aortic aneurysm (HCC)   Malignant  neoplasm of upper-outer quadrant of right breast in female, estrogen receptor positive ([place])   AAA (abdominal aortic aneurysm) (HCC)     Acute ST elevation myocardial infarction (STEMI) of inferior wall (HCC)/  Coronary artery disease involving native coronary artery of native heart with unstable angina pectoris ([place]) /  Presence of drug coated stent in right coronary artery /   Left main coronary artery disease - ~50-60% dLM (&70% dLAD) -- Plan Med Rx --Mildly hypotensive in the time of catheterization, blood pressure is now stabilized.  On aspirin and Brilinta -> plan for minimum 1 year.  High-dose high intensity statin (atorvastatin  80 mg)  Try low-dose metoprolol 12.5 mg twice daily today.  Plan for now is to treat left-sided disease medically.  Depending on symptoms, could consider ischemic evaluation to determine potential benefit of treatment of LAD/OM.     Ischemic cardiomyopathy -EF 40 and 45% with expected inferior hypokinesis.  Seems euvolemic on exam.  At present, blood pressure precludes significant medication titration, will start low-dose bisoprolol.    Hypothyroidism -continue home dose levothyroxine    Hyperlipidemia with target LDL less than 70 -> started on atorvastatin 80 mg daily    HTN (hypertension) -> was on low-dose amlodipine prior to hospitalization.  BP somewhat soft currently.    Will start low-dose bisoprolol 2.5 mg      AAA (abdominal aortic aneurysm) ([place]) /   Thoracic aortic aneurysm (HCC)  Continue to monitor, medical management for now.    Chronic  respiratory failure (HCC) -on home 3L O2 at baseline.  On Spiriva at home  Noting little dyspnea now, does not seem to be in distress, may be related to [place].  Monitor.  Continue Cymbalta  GERD -ongoing symptoms, will start pantoprazole MSK chest pain -if persist, would consider pulse dose steroids   We will mobilize today in CVICU.  Monitor blood pressures and dyspnea.  If stable by the afternoon, could consider transfer to telemetry. Given her age, reduced EF and other comorbidities, probably not a good option to consider fast-track discharge.  She is asking to go home tomorrow, but I would suspect that she be more stable to go home on the 26th.   For questions or updates, please contact [place] Please consult [email or web address] for contact info under        Signed, Glenetta Hew, MD  01/30/2019, 8:41 AM

## 2019-01-31 MED ORDER — TIOTROPIUM BROMIDE MONOHYDRATE 18 MCG IN CAPS: 18.0000 ug | ORAL_CAPSULE | Freq: Every day | RESPIRATORY_TRACT | Status: DC

## 2019-01-31 MED ORDER — IPRATROPIUM-ALBUTEROL 0.5-2.5 (3) MG/3ML IN SOLN: 3.0000 mL | Freq: Four times a day (QID) | RESPIRATORY_TRACT | Status: DC | PRN

## 2019-01-31 MED ORDER — LOSARTAN POTASSIUM 25 MG PO TABS
12.5000 mg | ORAL_TABLET | Freq: Every day | ORAL | Status: DC
  Administered 2019-01-31 – 2019-02-01 (×2): 12.5 mg via ORAL
  Filled 2019-01-31 (×3): qty 1

## 2019-01-31 MED ORDER — UMECLIDINIUM BROMIDE 62.5 MCG/INH IN AEPB
1.0000 | INHALATION_SPRAY | Freq: Every day | RESPIRATORY_TRACT | Status: DC
  Administered 2019-01-31 – 2019-02-01 (×2): 1 via RESPIRATORY_TRACT
  Filled 2019-01-31: qty 7

## 2019-01-31 MED ORDER — HOMATROPINE HBR 5 % OP SOLN: 1.0000 [drp] | Freq: Two times a day (BID) | OPHTHALMIC | Status: DC

## 2019-01-31 NOTE — Progress Notes (Signed)
Progress Note  Patient Name: [name] Date of Encounter: 01/31/2019  Primary Cardiologist:   New to Dr. Bubba Hales some with cardiac rehab yesterday.  (Felt like she would definitely need since is at home.  Suggested home health PT and home) She only notes dyspnea (has not received Spiriva yet) Still has some GERD.  No more chest pain.  Inpatient Medications    Scheduled Meds: . anastrozole  1 mg Oral Daily  . aspirin  81 mg Oral Daily  . atorvastatin  80 mg Oral q1800  . bisoprolol  2.5 mg Oral Daily  . Chlorhexidine Gluconate Cloth  6 each Topical Q0600  . DULoxetine  60 mg Oral Daily  . heparin  5,000 Units Subcutaneous Q8H  . levothyroxine  112 mcg Oral Q0600  . mouth rinse  15 mL Mouth Rinse BID  . pantoprazole  40 mg Oral Daily  . sodium chloride flush  3 mL Intravenous Q12H  . ticagrelor  90 mg Oral BID   Continuous Infusions: . sodium chloride     PRN Meds: sodium chloride, acetaminophen, ALPRAZolam, alum & mag hydroxide-simeth, docusate sodium, nitroGLYCERIN, ondansetron (ZOFRAN) IV, polyvinyl alcohol, sodium chloride flush, traZODone   Vital Signs    Vitals:   01/31/19 0400 01/31/19 0500 01/31/19 0600 01/31/19 0801  BP: 99/68 (!) 126/100 128/64   Pulse: 64 (!) 56 60   Resp: _0 Temp: 98.2 F (36.8 C)   (!) 97.2 F (36.2 C)  TempSrc: Oral   Axillary  SpO2: 98% 99% 99%     Intake/Output Summary (Last 24 hours) at 01/31/2019 0848 Last data filed at 01/30/2019 2100 Gross per 24 hour  Intake -  Output 600 ml  Net -600 ml   Last 3 Weights 08/22/2018 03/24/2018 09/19/2017  Weight (lbs) 148 lb 1 oz 149 lb 4 oz 148 lb 4 oz  Weight (kg) 67.161 kg 67.699 kg 67.246 kg      Telemetry    Mostly sinus rhythm with rates in the 70s.  Scattered PVCs.  Intermittent bursts of 3-7 beats NSVT via Ngetich is here- Personally Reviewed  ECG    Sinus rhythm rate 60s bpm.  Apparent evolving changes from inferior-for lateral STEMI with  subtle inferior ST elevations in II, III and aVF with T wave inversions as well as T wave inversions in V4-V5.- Personally Reviewed  Physical Exam   Physical Exam  Constitutional: She is oriented to person, place, and time. She appears well-developed and well-nourished. No distress.  Resting in chair - looks comfortable  HENT:  Head: Normocephalic and atraumatic.  Very hard of hearing.  Does not have the batteries for her hearing aids  Neck: Normal range of motion. Neck supple. No JVD present.  Cardiovascular: Normal rate, regular rhythm, S1 normal and S2 normal.  Occasional extrasystoles are present. PMI is not displaced. Exam reveals distant heart sounds and decreased pulses (Mildly decreased pedal pulses). Exam reveals no S4 and no friction rub.  Murmur (Soft 1/6 SEM at RUSB) heard. Pulmonary/Chest: Effort normal. No respiratory distress. She exhibits tenderness (Significant left lateral sternal chest pain on palpation).  Continues to have diminished breath sounds throughout with poor air movement.  No rales or rhonchi.  Abdominal: Soft. Bowel sounds are normal. She exhibits no distension. There is no abdominal tenderness. There is no rebound.  Musculoskeletal: Normal range of motion.        General: No edema.  Neurological: She is alert  and oriented to person, place, and time.  Skin: Skin is warm and dry.  Pale, no rash/lesions  Psychiatric: She has a normal mood and affect. Her behavior is normal. Judgment and thought content normal.  Nursing note and vitals reviewed.    Labs    High Sensitivity Troponin:   Recent Labs  Lab 01/29/19 1252  TROPONINIHS 82*      Cardiac EnzymesNo results for input(s): TROPONINI in the last 168 hours. No results for input(s): TROPIPOC in the last 168 hours.   Chemistry Recent Labs  Lab 01/29/19 1249 01/29/19 1252 01/30/19 0240  NA 141 143 143  K 3.5 3.4* 4.0  CL 101 103 103  CO2  --  32 32  GLUCOSE 151* 148* 93  BUN _0 CREATININE 0.50 0.45 0.53  CALCIUM  --  9.1 9.3  PROT  --  6.5 5.8*  ALBUMIN  --  3.4* 3.1*  AST  --  26 157*  ALT  --  12 24  ALKPHOS  --  37* 35*  BILITOT  --  0.5 0.7  GFRNONAA  --  >60 >60  GFRAA  --  >60 >60  ANIONGAP  --  8 8     Hematology Recent Labs  Lab 01/29/19 1249 01/29/19 1252 01/30/19 1001  WBC  --  2.9* 5.5  RBC  --  5.74* 3.73*  HGB 11.2* 18.6* 11.9*  HCT 33.0* 56.1* 37.9  MCV  --  97.7 101.6*  MCH  --  32.4 31.9  MCHC  --  33.2 31.4  RDW  --  12.9 13.2  PLT  --  80* 192    BNPNo results for input(s): BNP, PROBNP in the last 168 hours.   DDimer No results for input(s): DDIMER in the last 168 hours.   Radiology    No results found.  Cardiac Studies    Cardiac CATH-PCI 01/29/2019 (personally reviewed): 100% thrombotic occlusion of dRCA (PCI Resolute Onyx DES 3.5 mm 22 mm), dLM 60%,mLAD 50% & dLAD 70% (Med Rx). OM1 70%. (Tortuous axillaty, subclavian /innominate artery - unable to advance catheter into LV).  TTE (Inferolateral STEMI) 01/29/2019: Mild-moderately reduced LV function with basal-mid inferior and inferolateral HK.  EF 40 and 45%.  Moderate MAC.  Aortic sclerosis with no stenosis.  Mild dilation of ascending aorta.  Patient Profile     85 y.o. female with a history of hypertension, hyperlipidemia, history of breast cancer (February 2020), ascending thoracic aortic aneurysm, chronic hypoxic respiratory failure on oxygen who presented with a two-week history of indigestion that on the morning of 01/29/2019 awoke her from sleep.  Lasted 5 minutes.  EMS contacted for ongoing symptoms and was found to have inferior lateral ST elevation MI.  She is brought to [facility] where she was found to have an S4 gallop on exam and ST elevations in the inferolateral leads.  She was taken emergently for cardiac catheterization revealing an occluded dRCA that was treated with DES stent. She was also found to have moderate to severe distal left main and  distal LAD disease with plans to treat that medically.  Echo shows reduced EF with inferolateral hypokinesis.  Assessment & Plan    Principal Problem:   Acute ST elevation myocardial infarction (STEMI) of inferior wall (HCC) Active Problems:   Coronary artery disease involving native coronary artery of native heart with unstable angina pectoris (HCC)   Presence of drug coated stent in right coronary artery   Left  main coronary artery disease - ~50-60% dLM (&70% dLAD) -- Plan Med Rx   Ischemic cardiomyopathy   Hypothyroidism   Hyperlipidemia with target LDL less than 70   Chronic respiratory failure (HCC)   HTN (hypertension)   Thoracic aortic aneurysm (HCC)   Malignant neoplasm of upper-outer quadrant of right breast in female, estrogen receptor positive (HCC)   AAA (abdominal aortic aneurysm) (HCC)     Acute ST elevation myocardial infarction (STEMI) of inferior wall (HCC)/  Coronary artery disease involving native coronary artery of native heart with unstable angina pectoris ([place]) /  Presence of drug coated stent in right coronary artery /   Left main coronary artery disease - ~50-60% dLM (&70% dLAD) -- Plan Med Rx --Mildly hypotensive in the time of catheterization, blood pressure is now stabilized.  DAPT (min 1 yr) - ASA /Brilinta .  Atorvastatin 80 mg  Started Bystolic 2.5 mg due to COPD, I do not anticipate titrating up due to some bradycardia spells.  For now we will treat Left Main and LAD disease medically.  (Consider outpatient evaluation and treatment if necessary.)    Ischemic cardiomyopathy -EF 40 and 45% with expected inferior hypokinesis.  Seems euvolemic on exam.  On low-dose beta blocker.    We will attempt to start low-dose ARB today.    Hypothyroidism - Home dose levothyroxine    Hyperlipidemia with target LDL less than 70 -> high-dose atorvastatin    HTN (hypertension) -> was on low-dose amlodipine prior to hospitalization.  BP somewhat soft currently.     Seems to be tolerating low-dose bisoprolol, will add 12.5 mg losartan      AAA (abdominal aortic aneurysm) ([place]) /   Thoracic aortic aneurysm (HCC)  Continue to monitor, medical management for now.    Chronic respiratory failure (HCC) -on home 3L O2 at baseline.  On Spiriva at home -we will reorder along with as needed albuterol.  Noting little dyspnea now, does not seem to be in distress, may be related to Brilinta.  Monitor.  Continue Cymbalta  GERD -ongoing symptoms, will start pantoprazole MSK chest pain -if persist, would consider pulse dose steroids Eyedrops for macular degeneration.  Transfer to telemetry today.  Hopefully discharge tomorrow if stable. We will need to arrange for possible home health PT to evaluate for safety.    For questions or updates, please contact [place] Please consult [email or web address] for contact info under        Signed, Glenetta Hew, MD  01/31/2019, 8:48 AM

## 2019-01-31 NOTE — Progress Notes (Signed)
1315 Pt fast asleep in bed. Walked with PT earlier. We will continue to follow. Graylon Good RN BSN 01/31/2019 1:16 PM

## 2019-01-31 NOTE — Evaluation (Signed)
Physical Therapy Evaluation Patient Details Name: [name] MRN: [id number] DOB: [birth year] Today's Date: 01/31/2019   History of Present Illness  85 y.o. female with PMH of TIA, HTN, Asthma, Breast Ca, and HL who presented with ongoing chest pain. Called EMS, on scene EKG showed anterior ST elevation. CODE STEMI was called in the field and she was brought to the cath lab for emergent cath.    Clinical Impression  Pt admitted with above diagnosis. Pt currently with functional limitations due to the deficits listed below (see PT Problem List). PTA pt lived alone, ambulatory with cane. On eval, she required min guard assist bed mobility, transfers and ambulation 200 feet with RW. Pt on continuous O2 during session, primarily 3 L. O2 increased to 4 L during ambulation due to 2/4 DOE and pt c/o feeling SOB. Pt will benefit from skilled PT to increase their independence and safety with mobility to allow discharge to the venue listed below.       Follow Up Recommendations Home health PT;Supervision/Assistance - 24 hour    Equipment Recommendations  None recommended by PT    Recommendations for Other Services       Precautions / Restrictions Precautions Precautions: Fall;Other (comment) Precaution Comments: watch sats      Mobility  Bed Mobility Overal bed mobility: Needs Assistance Bed Mobility: Supine to Sit;Sit to Supine     Supine to sit: Min guard;HOB elevated Sit to supine: Min guard;HOB elevated   General bed mobility comments: +rail, increased time and effort  Transfers Overall transfer level: Needs assistance Equipment used: Ambulation equipment used Transfers: Sit to/from [facility] Sit to Stand: Min guard Stand pivot transfers: Min guard       General transfer comment: min guard for safety  Ambulation/Gait Ambulation/Gait assistance: Min guard Gait Distance (Feet): 200 Feet Assistive device: Rolling walker (2 wheeled) Gait  Pattern/deviations: Step-through pattern;Decreased stride length Gait velocity: decreased Gait velocity interpretation: <1.31 ft/sec, indicative of household ambulator General Gait Details: Cues to stay close to RW. Initially ambulating on 3 L O2. Increased to 4 L due to 2/4 DOE. SpO2 96-98% during mobililty.  Stairs            Wheelchair Mobility    Modified Rankin (Stroke Patients Only)       Balance Overall balance assessment: Needs assistance Sitting-balance support: No upper extremity supported;Feet supported Sitting balance-Leahy Scale: Good     Standing balance support: Bilateral upper extremity supported;During functional activity Standing balance-Leahy Scale: Poor Standing balance comment: reliant on BUE support                             Pertinent Vitals/Pain Pain Assessment: No/denies pain    Home Living Family/patient expects to be discharged to:: Private residence Living Arrangements: Alone Available Help at Discharge: Family;Available 24 hours/day Type of Home: House Home Access: Ramped entrance     Home Layout: One level Home Equipment: Walker - 2 wheels;Cane - single point;Tub bench;Hand held shower head;Wheelchair - manual      Prior Function Level of Independence: Independent with assistive device(s);Needs assistance   Gait / Transfers Assistance Needed: Ambulates with cane. On 3 L O2 at baseline.  ADL's / Homemaking Assistance Needed: Daughter assists with cooking and cleaning.        Hand Dominance   Dominant Hand: Right    Extremity/Trunk Assessment   Upper Extremity Assessment Upper Extremity Assessment: Generalized weakness    Lower  Extremity Assessment Lower Extremity Assessment: Generalized weakness    Cervical / Trunk Assessment Cervical / Trunk Assessment: Kyphotic  Communication   Communication: HOH  Cognition Arousal/Alertness: Awake/alert Behavior During Therapy: WFL for tasks  assessed/performed Overall Cognitive Status: No family/caregiver present to determine baseline cognitive functioning                                 General Comments: Difficult to fully assess due to pt very HOH. Batteries are dead in her hearing aids. Decreased safety awareness. Occassional tangential speech. Cues to stay on task. A&O x 4. Follows commands.      General Comments General comments (skin integrity, edema, etc.): 2/4 DOE during ambulation, with pt c/o feeling SOB. O2 increased from 3 to 4 L. Pt on continuous O2 throughout session.    Exercises     Assessment/Plan    PT Assessment Patient needs continued PT services  PT Problem List Decreased strength;Decreased mobility;Decreased safety awareness;Decreased activity tolerance;Cardiopulmonary status limiting activity;Decreased balance       PT Treatment Interventions DME instruction;Therapeutic activities;Gait training;Therapeutic exercise;Patient/family education;Balance training;Functional mobility training    PT Goals (Current goals can be found in the Care Plan section)  Acute Rehab PT Goals Patient Stated Goal: home PT Goal Formulation: With patient Time For Goal Achievement: 02/14/19 Potential to Achieve Goals: Good    Frequency Min 3X/week   Barriers to discharge        Co-evaluation               AM-PAC PT "6 Clicks" Mobility  Outcome Measure Help needed turning from your back to your side while in a flat bed without using bedrails?: None Help needed moving from lying on your back to sitting on the side of a flat bed without using bedrails?: A Little Help needed moving to and from a bed to a chair (including a wheelchair)?: A Little Help needed standing up from a chair using your arms (e.g., wheelchair or bedside chair)?: A Little Help needed to walk in hospital room?: A Little Help needed climbing 3-5 steps with a railing? : A Lot 6 Click Score: 18    End of Session Equipment  Utilized During Treatment: Gait belt;Oxygen Activity Tolerance: Patient tolerated treatment well Patient left: in bed;with call bell/phone within reach Nurse Communication: Mobility status PT Visit Diagnosis: Difficulty in walking, not elsewhere classified (R26.2)    Time: 1607-3710 PT Time Calculation (min) (ACUTE ONLY): 25 min   Charges:   PT Evaluation $PT Eval Moderate Complexity: 1 Mod PT Treatments $Gait Training: 8-22 mins        Lorrin Goodell, PT  Office # [phone] Pager [phone]   Lorriane Shire 01/31/2019, 11:58 AM

## 2019-01-31 NOTE — Progress Notes (Signed)
Spoke with pt's daughter and POA Magdalynn Davilla [phone].  Updated her on pt status and room change.  Pt's POA lives in [place] but is easily reachable by phone.

## 2019-02-01 DIAGNOSIS — E785 Hyperlipidemia, unspecified: Secondary | ICD-10-CM

## 2019-02-01 MED ORDER — TICAGRELOR 90 MG PO TABS: 90.0000 mg | ORAL_TABLET | Freq: Two times a day (BID) | ORAL | 3 refills | Status: DC

## 2019-02-01 MED ORDER — LOSARTAN POTASSIUM 25 MG PO TABS: 12.5000 mg | ORAL_TABLET | Freq: Every day | ORAL | 2 refills | Status: DC

## 2019-02-01 MED ORDER — TICAGRELOR 90 MG PO TABS: 90.0000 mg | ORAL_TABLET | Freq: Two times a day (BID) | ORAL | 0 refills | Status: DC

## 2019-02-01 NOTE — Care Management (Addendum)
RN confirmed that patient received the following meds from [place]: Brilinta, ASA, Lipitor, Zebeta, and Nitrostat.  Cozaar is sent to pharmacy and not needed until tomorrow.  Discussed home health PT with patient.  She does not want to arrange at this time due to fear of exposure to [place].  She understands that her PMD can arrange [facility] PT or outpatient PT should she change her mind.

## 2019-02-01 NOTE — Discharge Summary (Signed)
Discharge Summary    Patient ID: [name] MRN: [id number]; DOB: Mar 31, 1931  Admit date: 01/29/2019 Discharge date: 02/01/2019  Primary Care Provider: Tonia Ghent, MD  Primary Cardiologist: Sinclair Grooms, MD  Primary Electrophysiologist:  None   Discharge Diagnoses    Principal Problem:   Acute ST elevation myocardial infarction (STEMI) of inferior wall [facility]) Active Problems:   Hypothyroidism   Hyperlipidemia with target LDL less than 70   Chronic respiratory failure (HCC)   HTN (hypertension)   Thoracic aortic aneurysm ([place])   Malignant neoplasm of upper-outer quadrant of right breast in female, estrogen receptor positive ([place])   AAA (abdominal aortic aneurysm) ([place])   Coronary artery disease involving native coronary artery of native heart with unstable angina pectoris ([place])   Presence of drug coated stent in right coronary artery   Left main coronary artery disease - ~50-60% dLM (&70% dLAD) -- Plan Med Rx   Ischemic cardiomyopathy   Allergies Allergies  Allergen Reactions  . Doxycycline Other (See Comments)    GI upset  . Sulfadiazine Other (See Comments)    REACTION: Fever, aches  . Sulfa Antibiotics Rash    Diagnostic Studies/Procedures    Cath 01/29/2019  Acute inferior ST elevation myocardial infarction with onset of symptoms 6:30 AM  Thrombotic occlusion of the mid to distal RCA treated with a 22 x 3.5 mm Onyx. TIMI grade 0 flow improved to TIMI grade III flow with 0% stenosis noted.  60% distal left main  50% mid LAD and 70% distal LAD.  70% first obtuse marginal.  Left ventricle was entered with a wire but because of significant tortuosity in the patient's axillary and subclavian artery we were unable to advance the catheter into the left ventricle.  RECOMMENDATIONS:   Aspirin and Brilinta for 12 months. If bleeding issues could switch to Plavix. Would be helpful to continue Brilinta as long as possible before switching.   Aggressive risk factor modification  2D Doppler echocardiogram to assess LV function  Cardioprotective therapy with ARB and beta-blocker therapy as tolerated by heart rate and blood pressure. Given relatively low blood pressures upon leaving the Cath Lab, this therapy will be instituted by a team member.  Overall plan for left main disease is conservative medical management given age and other medical problems. This can be debated.`   Echo 01/29/2019 IMPRESSIONS   1. Moderate hypokinesis of the left ventricular, basal-mid inferior wall and inferolateral wall. 2. The left ventricle has mild-moderately reduced systolic function, with an ejection fraction of 40-45%. The cavity size was normal. Left ventricular diastolic Doppler parameters are consistent with impaired relaxation. Elevated mean left atrial  pressure. 3. The right ventricle has normal systolic function. The cavity was normal. There is no increase in right ventricular wall thickness. 4. The mitral valve is degenerative. There is moderate mitral annular calcification present. 5. Mild thickening of the aortic valve. Sclerosis without any evidence of stenosis of the aortic valve. Aortic valve regurgitation is mild by color flow Doppler. 6. The aorta is abnormal in size and structure. 7. There is mild dilatation of the ascending aorta. _____________   History of Present Illness     [name] is a 85 yo female with PMH noted above. She had never seen a cardiologist in the past. Does report a hx of a heart murmur. Currently being followed by Dr. Jana Hakim at the [facility]. Underwent a right breast biopsy on 09/05/18 which was + for invasive ductal carcinoma. Currently  being treated with anastrozole which was started on 10/27/18. Last visit was virtual on 11/20/18 and reported doing well overall. Planned for follow up September after follow up US in august.  She has been in usual state of health until about 2 weeks  prior. Currently lives at home alone, and daughter helps with most of her household chores and shopping. She has not been out much as a result of the pandemic. Developed a burning sensation in her chest and was attempting to treat with TUMS. Symptoms waxed and waned over the past 2 weeks. Had a virtual visit with her PCP back on 01/27/19 and Protonix was increased to BID. She do not have much symptom relief with this change. This morning around 6:30am she developed worsening chest burning with radiation into the left jaw and arm. Called PCP office with symptoms and was instructed to called EMS or go to the ED for further evaluation. Called EMS, on scene EKG showed anterior ST elevation. CODE STEMI was called in the field and she was brought to the cath lab for emergent cath.   Hospital Course     Consultants: N/A  Patient underwent urgent cardiac catheterization on 01/29/2019 which showed thrombotic occlusion to m-dRCA treated with 3.5x22 mm Onyx DES, 60% distal LM, 50% mLAD, 70% dLAD, 70% OM1.  Left main and LAD disease managed medically.  Postprocedure, she was started on bisoprolol 2.5 mg daily, aspirin and Brilinta.  A low-dose losartan was also added.  Echocardiogram obtained on the same day showed EF 40 to 45%, moderate hypokinesis of the basal inferior and inferolateral wall.  Lipid panel obtained during the hospitalization showed LDL 141, total cholesterol 207, HDL 44, triglyceride 108.  She was placed on high-dose statin.  Patient was seen in the morning of 02/01/2019 at which time she was doing well without any chest discomfort or shortness of breath.  She appears to be euvolemic despite LV dysfunction.  She is deemed stable for discharge from cardiology perspective.  I will arrange some of a transition of care follow-up for the patient.  If her blood pressure stable as outpatient, plan to uptitrate ARB. She will need FLP and LFT in 6-8 weeks  _____________  Discharge Vitals Blood pressure  107/79, pulse (!) 58, temperature 98.3 F (36.8 C), temperature source Oral, resp. rate 17, weight 64.1 kg, SpO2 96 %.  Filed Weights   02/01/19 0604  Weight: 64.1 kg    Labs & Radiologic Studies    CBC Recent Labs    01/29/19 1252 01/30/19 1001  WBC 2.9* 5.5  HGB 18.6* 11.9*  HCT 56.1* 37.9  MCV 97.7 101.6*  PLT 80* 696   Basic Metabolic Panel Recent Labs    01/29/19 1252 01/30/19 0240  NA 143 143  K 3.4* 4.0  CL 103 103  CO2 32 32  GLUCOSE 148* 93  BUN 10 8  CREATININE 0.45 0.53  CALCIUM 9.1 9.3   Liver Function Tests Recent Labs    01/29/19 1252 01/30/19 0240  AST 26 157*  ALT 12 24  ALKPHOS 37* 35*  BILITOT 0.5 0.7  PROT 6.5 5.8*  ALBUMIN 3.4* 3.1*   No results for input(s): LIPASE, AMYLASE in the last 72 hours. Cardiac Enzymes No results for input(s): CKTOTAL, CKMB, CKMBINDEX, TROPONINI in the last 72 hours. BNP Invalid input(s): POCBNP D-Dimer No results for input(s): DDIMER in the last 72 hours. Hemoglobin A1C Recent Labs    01/30/19 0539  HGBA1C 5.6   Fasting Lipid Panel  Recent Labs    01/29/19 1252  CHOL 207*  HDL 44  LDLCALC 141*  TRIG 108  CHOLHDL 4.7   Thyroid Function Tests No results for input(s): TSH, T4TOTAL, T3FREE, THYROIDAB in the last 72 hours.  Invalid input(s): FREET3 _____________  No results found. Disposition   Pt is being discharged home today in good condition.  Follow-up Plans & Appointments    Follow-up Information    Belva Crome, MD Follow up.   Specialty: Cardiology Why: cardiology office scheduler will call you to arrange followup visit, please give [place] a call if you do not hear from our scheduler in 3 business days.  Contact information: [address] [address] [phone]        Tonia Ghent, MD. Schedule an appointment as soon as possible for a visit.   Specialty: Family Medicine Contact information: [place] [address]  [phone]          Discharge Instructions    Amb Referral to Cardiac Rehabilitation   Complete by: As directed    Diagnosis:  Coronary Stents STEMI PTCA     After initial evaluation and assessments completed: Virtual Based Care may be provided alone or in conjunction with Phase 2 Cardiac Rehab based on patient barriers.: Yes   Diet - low sodium heart healthy   Complete by: As directed    Discharge instructions   Complete by: As directed    No driving for 24 hours. No lifting over 5 lbs for 1 week. No sexual activity for 1 week. Keep procedure site clean & dry. If you notice increased pain, swelling, bleeding or pus, call/return!  You may shower, but no soaking baths/hot tubs/pools for 1 week.   Increase activity slowly   Complete by: As directed       Discharge Medications   Allergies as of 02/01/2019      Reactions   Doxycycline Other (See Comments)   GI upset   Sulfadiazine Other (See Comments)   REACTION: Fever, aches   Sulfa Antibiotics Rash      Medication List    STOP taking these medications   amLODipine 2.5 MG tablet Commonly known as: NORVASC   ibuprofen 200 MG tablet Commonly known as: ADVIL     TAKE these medications   ALPRAZolam 1 MG tablet Commonly known as: XANAX TAKE 1/2 TABLET BY MOUTH 2 TIMES DAILY AS NEEDED FOR ANXIETY. What changed: See the new instructions.   anastrozole 1 MG tablet Commonly known as: ARIMIDEX Take 1 tablet (1 mg total) by mouth daily.   aspirin 81 MG chewable tablet Chew 1 tablet (81 mg total) by mouth daily.   atorvastatin 80 MG tablet Commonly known as: LIPITOR Take 1 tablet (80 mg total) by mouth daily at 6 PM.   bisoprolol 5 MG tablet Commonly known as: ZEBETA Take 0.5 tablets (2.5 mg total) by mouth daily.   CALTRATE 600 PO Take 600 mg by mouth daily.   cholecalciferol 25 MCG (1000 UT) tablet Commonly known as: VITAMIN D Take 1 tablet (1,000 Units total) by mouth daily.   Colon Cleanse Caps Take 1  capsule by mouth 2 (two) times a day.   diclofenac sodium 1 % Gel Commonly known as: VOLTAREN APPLY 4 GRAMS TO AFFECTED AREA 2 TIMES A DAY. What changed: See the new instructions.   docusate sodium 100 MG capsule Commonly known as: Colace Take 1 capsule (100 mg total) by mouth daily  as needed for mild constipation. What changed: when to take this   DULoxetine 60 MG capsule Commonly known as: CYMBALTA TAKE 1 CAPSULE BY MOUTH DAILY   ferrous sulfate 325 (65 FE) MG tablet Commonly known as: FerrouSul Take 1 tablet (325 mg total) by mouth daily with breakfast.   gabapentin 300 MG capsule Commonly known as: NEURONTIN 2 tabs in the AM and 1 tab in the afternoon What changed:   how much to take  how to take this  when to take this  additional instructions   hydroxypropyl methylcellulose / hypromellose 2.5 % ophthalmic solution Commonly known as: ISOPTO TEARS / GONIOVISC Place 1 drop into both eyes 3 (three) times daily as needed for dry eyes.   levothyroxine 112 MCG tablet Commonly known as: SYNTHROID TAKE 1 TABLET BY MOUTH DAILY BEFORE BREAKFAST. What changed: See the new instructions.   losartan 25 MG tablet Commonly known as: COZAAR Take 0.5 tablets (12.5 mg total) by mouth daily.   multivitamin with minerals Tabs tablet Take 1 tablet by mouth daily.   nitroGLYCERIN 0.4 MG SL tablet Commonly known as: NITROSTAT Place 1 tablet (0.4 mg total) under the tongue every 5 (five) minutes as needed for chest pain.   OCUVITE PRESERVISION PO Take 1 tablet by mouth daily.   omeprazole 20 MG capsule Commonly known as: PRILOSEC Take 1 capsule (20 mg total) by mouth 2 (two) times daily before a meal. What changed:   how much to take  when to take this   OXYGEN Inhale 3-3.5 L into the lungs continuous.   ticagrelor 90 MG Tabs tablet Commonly known as: BRILINTA Take 1 tablet (90 mg total) by mouth 2 (two) times daily.   tiotropium 18 MCG inhalation capsule Commonly  known as: Spiriva HandiHaler PLACE 1 CAPSULE INTO INHALER AND INHALE ONCE A DAY What changed:   how much to take  how to take this  when to take this  additional instructions   traZODone 50 MG tablet Commonly known as: DESYREL Take 3 tablets (150 mg total) by mouth at bedtime as needed for sleep. What changed: how much to take        Acute coronary syndrome (MI, NSTEMI, STEMI, etc) this admission?: Yes.     AHA/ACC Clinical Performance & Quality Measures: 1. Aspirin prescribed? - Yes 2. ADP Receptor Inhibitor (Plavix/Clopidogrel, Brilinta/Ticagrelor or Effient/Prasugrel) prescribed (includes medically managed patients)? - Yes 3. Beta Blocker prescribed? - Yes 4. High Intensity Statin (Lipitor 40-80mg  or Crestor 20-40mg ) prescribed? - Yes 5. EF assessed during THIS hospitalization? - Yes 6. For EF <40%, was ACEI/ARB prescribed? - Yes 7. For EF <40%, Aldosterone Antagonist (Spironolactone or Eplerenone) prescribed? - No - Reason:  borderline low BP 8. Cardiac Rehab Phase II ordered (Included Medically managed Patients)? - Yes     Outstanding Labs/Studies   6-8 weeks FLP and LFT  Duration of Discharge Encounter   Greater than 30 minutes including physician time.  Hilbert Corrigan, PA 02/01/2019, 10:05 AM

## 2019-02-01 NOTE — Progress Notes (Addendum)
Progress Note  Patient Name: [name] Date of Encounter: 02/01/2019  Primary Cardiologist: Sinclair Grooms, MD   Subjective   Denies any CP or SOB. Has not seen cardiac rehab today. She says she lives with her daughter who does most of the cooking.   She notes that her breathing is much better since being back on the Brilinta.  No chest pain.  Walked around the hallway with no problems today. Also notes that her GERD is better.   Inpatient Medications    Scheduled Meds: . anastrozole  1 mg Oral Daily  . aspirin  81 mg Oral Daily  . atorvastatin  80 mg Oral q1800  . bisoprolol  2.5 mg Oral Daily  . Chlorhexidine Gluconate Cloth  6 each Topical Q0600  . DULoxetine  60 mg Oral Daily  . heparin  5,000 Units Subcutaneous Q8H  . levothyroxine  112 mcg Oral Q0600  . losartan  12.5 mg Oral Daily  . mouth rinse  15 mL Mouth Rinse BID  . pantoprazole  40 mg Oral Daily  . sodium chloride flush  3 mL Intravenous Q12H  . ticagrelor  90 mg Oral BID  . umeclidinium bromide  1 puff Inhalation Daily   Continuous Infusions: . sodium chloride     PRN Meds: sodium chloride, acetaminophen, ALPRAZolam, alum & mag hydroxide-simeth, docusate sodium, ipratropium-albuterol, nitroGLYCERIN, ondansetron (ZOFRAN) IV, polyvinyl alcohol, sodium chloride flush, traZODone   Vital Signs    Vitals:   01/31/19 1527 01/31/19 2033 02/01/19 0604 02/01/19 0733  BP: 122/65 139/87 107/79   Pulse:  62 (!) 58   Resp:  17 17   Temp:  98 F (36.7 C) 98.3 F (36.8 C)   TempSrc:  Oral Oral   SpO2:  100% 97% 96%  Weight:   64.1 kg     Intake/Output Summary (Last 24 hours) at 02/01/2019 0757 Last data filed at 02/01/2019 0617 Gross per 24 hour  Intake 120 ml  Output 1000 ml  Net -880 ml   Last 3 Weights 02/01/2019 08/22/2018 03/24/2018  Weight (lbs) 141 lb 4.8 oz 148 lb 1 oz 149 lb 4 oz  Weight (kg) 64.093 kg 67.161 kg 67.699 kg      Telemetry    NSR with occasional bradycardia - Personally  Reviewed  ECG    NSR with inferior Q wave and PVC - Personally Reviewed  Physical Exam   GEN: No acute distress.   Neck: No JVD Cardiac:  Distant heart sounds.  RRR, soft 1/6 SEM at RUSB, no rubs, or gallops.  Respiratory: Clear to auscultation bilaterally.  Nonlabored. GI: Soft, nontender, non-distended  MS: No edema; No deformity. Neuro:  Nonfocal  Psych: Normal affect   Labs    High Sensitivity Troponin:   Recent Labs  Lab 01/29/19 1252  TROPONINIHS 82*      Cardiac EnzymesNo results for input(s): TROPONINI in the last 168 hours. No results for input(s): TROPIPOC in the last 168 hours.   Chemistry Recent Labs  Lab 01/29/19 1249 01/29/19 1252 01/30/19 0240  NA 141 143 143  K 3.5 3.4* 4.0  CL 101 103 103  CO2  --  32 32  GLUCOSE 151* 148* 93  BUN 11 10 8   CREATININE 0.50 0.45 0.53  CALCIUM  --  9.1 9.3  PROT  --  6.5 5.8*  ALBUMIN  --  3.4* 3.1*  AST  --  26 157*  ALT  --  12 24  ALKPHOS  --  55* 35*  BILITOT  --  0.5 0.7  GFRNONAA  --  >60 >60  GFRAA  --  >60 >60  ANIONGAP  --  8 8     Hematology Recent Labs  Lab 01/29/19 1249 01/29/19 1252 01/30/19 1001  WBC  --  2.9* 5.5  RBC  --  5.74* 3.73*  HGB 11.2* 18.6* 11.9*  HCT 33.0* 56.1* 37.9  MCV  --  97.7 101.6*  MCH  --  32.4 31.9  MCHC  --  33.2 31.4  RDW  --  12.9 13.2  PLT  --  80* 192    BNPNo results for input(s): BNP, PROBNP in the last 168 hours.   DDimer No results for input(s): DDIMER in the last 168 hours.   Radiology    No results found.  Cardiac Studies   Cath 01/29/2019  Acute inferior ST elevation myocardial infarction with onset of symptoms 6:30 AM  Thrombotic occlusion of the mid to distal RCA treated with a 22 x 3.5 mm Onyx.  TIMI grade 0 flow improved to TIMI grade III flow with 0% stenosis noted.  60% distal left main  50% mid LAD and 70% distal LAD.  70% first obtuse marginal.  Left ventricle was entered with a wire but because of significant tortuosity in  the patient's axillary and subclavian artery we were unable to advance the catheter into the left ventricle.  RECOMMENDATIONS:   Aspirin and Brilinta for 12 months.  If bleeding issues could switch to Plavix.  Would be helpful to continue Brilinta as long as possible before switching.  Aggressive risk factor modification  2D Doppler echocardiogram to assess LV function  Cardioprotective therapy with ARB and beta-blocker therapy as tolerated by heart rate and blood pressure.  Given relatively low blood pressures upon leaving the Cath Lab, this therapy will be instituted by a team member.  Overall plan for left main disease is conservative medical management given age and other medical problems.  This can be debated.`   Echo 01/29/2019 IMPRESSIONS    1. Moderate hypokinesis of the left ventricular, basal-mid inferior wall and inferolateral wall.  2. The left ventricle has mild-moderately reduced systolic function, with an ejection fraction of 40-45%. The cavity size was normal. Left ventricular diastolic Doppler parameters are consistent with impaired relaxation. Elevated mean left atrial  pressure.  3. The right ventricle has normal systolic function. The cavity was normal. There is no increase in right ventricular wall thickness.  4. The mitral valve is degenerative. There is moderate mitral annular calcification present.  5. Mild thickening of the aortic valve. Sclerosis without any evidence of stenosis of the aortic valve. Aortic valve regurgitation is mild by color flow Doppler.  6. The aorta is abnormal in size and structure.  7. There is mild dilatation of the ascending aorta.  Patient Profile     85 y.o. female with PMH of HTN, HLD, hypothyroidism, AAA, chronic respiratory failure and breast CA presented with new chest pain and found to have thrombotic occlusion of RCA treated with DES. Residual LAD and LM disease managed medicalyl.  Assessment & Plan   Principal Problem:    Acute ST elevation myocardial infarction (STEMI) of inferior wall (HCC) Active Problems:   Coronary artery disease involving native coronary artery of native heart with unstable angina pectoris (HCC)   Presence of drug coated stent in right coronary artery   Left main coronary artery disease - ~50-60% dLM (&70% dLAD) -- Plan Med Rx  Ischemic cardiomyopathy   Hypothyroidism   Hyperlipidemia with target LDL less than 70   Chronic respiratory failure (HCC)   HTN (hypertension)   Thoracic aortic aneurysm (HCC)   Malignant neoplasm of upper-outer quadrant of right breast in female, estrogen receptor positive ([place])   AAA (abdominal aortic aneurysm) ([place])    1. Inferior STEMI- Cath 01/29/2019 showed thrombotic occlusion to m-dRCA treated with 3.5x22 mm Onyx DES, 60% distal LM, 50% mLAD, 70% dLAD, 70% OM1  - continue aspirin and Brilinta.   - LM and LAD disease managed medically  - Bisoprolol 2.5 mg  - D/C today once seen by cardiac rehab. Need 7 day TOC visit  2. Ischemic cardiomyopathy  - Echo 01/29/2019 showed EF 40-45%, moderate hypokinesis of basal mid inferior and inferolateral wall - euvolemic  - on low dose ARB & Bisoprolol  3. HTN: continue low dose bisoprolol and losartan  4. HLD: LDL 141. high dose lipitor 80mg  daily  5. Hypothyroidism -  On Synthroid   6. AAA/TAA: 3.9 cm infrarenal AAA on CTA 12/27/2018, recommend followup doppler in 2 years   7. Chronic respiratory failure on 3L home O2; on Spireva  8. Breast cancer: confirmed on biopsy in Feb 2020. Currently being treated with anastrozole   For questions or updates, please contact [place] Please consult [email or web address] for contact info under        Signed, Almyra Deforest, [place]  02/01/2019, 7:57 AM     ATTENDING ATTESTATION  I have seen, examined and evaluated the patient this PM along with Mr. Eulas Post, [place].  After reviewing all the available data and chart, we discussed the patients laboratory, study & physical  findings as well as symptoms in detail. I agree with his findings, examination as well as impression recommendations as per our discussion.    Attending adjustments noted in italics.   Ms. Gutter looks great today.  Her breathing has notably improved.  No further symptoms.  Seems to be tolerating very low doses of bisoprolol and losartan.  Is relatively euvolemic and probably does not need a diuretic.  Ok for d/c today - anticipate being able to increase ARB in OP f/u - but not likely Beta Blocker.    Glenetta Hew, M.D., M.S. Interventional Cardiologist   Pager # [phone] Phone # [phone] [address] [place], [place] [address]

## 2019-02-02 ENCOUNTER — Telehealth: Payer: Self-pay

## 2019-02-02 ENCOUNTER — Telehealth: Payer: Self-pay | Admitting: Cardiology

## 2019-02-02 NOTE — Telephone Encounter (Signed)
Transition Care Management Follow-up Telephone Call   Date discharged? 02/01/2019   How have you been since you were released from the hospital? Patient is doing better and not having chest pains anymore.   Do you understand why you were in the hospital? yes   Do you understand the discharge instructions? yes   Where were you discharged to? Home-daughter is helping patient out.   Items Reviewed:  Medications reviewed: yes  Allergies reviewed: yes  Dietary changes reviewed: yes  Referrals reviewed: to follow up with cardiologist on 02/05/2019   Functional Questionnaire:   Activities of Daily Living (ADLs):   She states they are independent in the following: dressing, bathing, hygiene, toileting, feeding. Her daughter does stay with patient in the bathroom just in case. States they require assistance with the following: ambulation-using cane and walker.   Any transportation issues/concerns?: Yes but thinks her daughter will be able to help.   Any patient concerns?  Not at this time.   Confirmed importance and date/time of follow-up visits scheduled not at this time and patient will call back to schedule after looking at her calendar.   Confirmed with patient if condition begins to worsen call PCP or go to the ER.  Patient was given the office number and encouraged to call back with question or concerns.  : yes

## 2019-02-02 NOTE — Telephone Encounter (Signed)
New Message   Patient has a TOC appt 7/27 with Ellen Henri.

## 2019-02-03 ENCOUNTER — Telehealth (HOSPITAL_COMMUNITY): Payer: Self-pay

## 2019-02-03 NOTE — Telephone Encounter (Signed)
Pt returned call and is interested in the cardiac rehab program will follow up after pt has completed her f/u appt.

## 2019-02-03 NOTE — Telephone Encounter (Signed)
Noted. Thanks.

## 2019-02-03 NOTE — Telephone Encounter (Signed)
Pt insurance is active and benefits verified through [facility] Medicare Co-pay $20.00, DED 0/0 met, out of pocket $4,000/$580.25 met, co-insurance 0. no pre-authorization required. Passport, 02/03/2019, REF# [id number]  Will contact patient to see if she is interested in the Cardiac Rehab Program. If interested, patient will need to complete follow up appt. Once completed, patient will be contacted for scheduling upon review by the RN Navigator. [name]. Support Rep II

## 2019-02-04 ENCOUNTER — Telehealth: Payer: Self-pay | Admitting: *Deleted

## 2019-02-04 NOTE — Telephone Encounter (Signed)
Pt has answered NO to the following questions:      COVID-19 Pre-Screening Questions:  . In the past 7 to 10 days have you had a cough,  shortness of breath, headache, congestion, fever (100 or greater) body aches, chills, sore throat, or sudden loss of taste or sense of smell? . Have you been around anyone with known Covid 19. . Have you been around anyone who is awaiting Covid 19 test results in the past 7 to 10 days? . Have you been around anyone who has been exposed to Covid 19, or has mentioned symptoms of Covid 19 within the past 7 to 10 days?  If you have any concerns/questions about symptoms patients report during screening (either on the phone or at threshold). Contact the provider seeing the patient or DOD for further guidance.  If neither are available contact a member of the leadership team.

## 2019-02-05 ENCOUNTER — Encounter: Payer: Self-pay | Admitting: Cardiology

## 2019-02-05 ENCOUNTER — Other Ambulatory Visit: Payer: Self-pay

## 2019-02-05 ENCOUNTER — Ambulatory Visit (INDEPENDENT_AMBULATORY_CARE_PROVIDER_SITE_OTHER): Payer: Medicare Other | Admitting: Cardiology

## 2019-02-05 VITALS — BP 120/70 | HR 74 | Ht 60.0 in | Wt 145.0 lb

## 2019-02-05 DIAGNOSIS — Z9861 Coronary angioplasty status: Secondary | ICD-10-CM

## 2019-02-05 DIAGNOSIS — I2510 Atherosclerotic heart disease of native coronary artery without angina pectoris: Secondary | ICD-10-CM | POA: Diagnosis not present

## 2019-02-05 DIAGNOSIS — Z79899 Other long term (current) drug therapy: Secondary | ICD-10-CM

## 2019-02-05 DIAGNOSIS — E785 Hyperlipidemia, unspecified: Secondary | ICD-10-CM | POA: Diagnosis not present

## 2019-02-05 LAB — BASIC METABOLIC PANEL
BUN/Creatinine Ratio: 22 (ref 12–28)
BUN: 13 mg/dL (ref 8–27)
CO2: 29 mmol/L (ref 20–29)
Calcium: 9 mg/dL (ref 8.7–10.3)
Chloride: 101 mmol/L (ref 96–106)
Creatinine, Ser: 0.59 mg/dL (ref 0.57–1.00)
GFR calc Af Amer: 97 mL/min/{1.73_m2} (ref >59)
GFR calc non Af Amer: 84 mL/min/{1.73_m2} (ref >59)
Glucose: 107 mg/dL — ABNORMAL HIGH (ref 65–99)
Potassium: 4.4 mmol/L (ref 3.5–5.2)
Sodium: 144 mmol/L (ref 134–144)

## 2019-02-05 NOTE — Progress Notes (Signed)
02/05/2019 [name]   1930-11-24  [id number]  Primary Physician Tonia Ghent, MD Primary Cardiologist: Sinclair Grooms, MD  Electrophysiologist: None   Reason for Visit/CC: Transition of care post hospital follow-up for CAD and systolic heart failure, status post acute STEMI  HPI:  85 y/o female w/ breast cancer, uderwent a right breast biopsy on 09/05/18 which was + for invasive ductal carcinoma. Currently being treated with anastrozole which was started on 10/27/18.  She also has COPD, on supplemental O2  She recently presented to [facility] w/ CP and EKG c/w acute STEMI.   Patient underwent urgent cardiac catheterization on 01/29/2019 which showed thrombotic occlusion to m-dRCA treated with 3.5x22 mm Onyx DES, 60% distal LM, 50% mLAD, 70% dLAD, 70% OM1.  Left main and LAD disease managed medically.  Postprocedure, she was started on bisoprolol 2.5 mg daily, aspirin and Brilinta.  A low-dose losartan was also added.  Echocardiogram obtained on the same day showed EF 40 to 45%, moderate hypokinesis of the basal inferior and inferolateral wall.  Lipid panel obtained during the hospitalization showed LDL 141, total cholesterol 207, HDL 44, triglyceride 108.  She was placed on high-dose statin.  She presents to clinic today for follow-up.  She is accompanied by her daughter.  She reports that she has done well since discharge.  She denies any further anginal symptomatology.  No chest pain no dyspnea.  Reports full compliance of medications.  No side effects.  Blood pressure 120/70 today.  No edema on exam.  Cardiac Studies  Cath 01/29/2019  Acute inferior ST elevation myocardial infarction with onset of symptoms 6:30 AM  Thrombotic occlusion of the mid to distal RCA treated with a 22 x 3.5 mm Onyx. TIMI grade 0 flow improved to TIMI grade III flow with 0% stenosis noted.  60% distal left main  50% mid LAD and 70% distal LAD.  70% first obtuse marginal.  Left ventricle was entered  with a wire but because of significant tortuosity in the patient's axillary and subclavian artery we were unable to advance the catheter into the left ventricle.  RECOMMENDATIONS:   Aspirin and Brilinta for 12 months. If bleeding issues could switch to Plavix. Would be helpful to continue Brilinta as long as possible before switching.  Aggressive risk factor modification  2D Doppler echocardiogram to assess LV function  Cardioprotective therapy with ARB and beta-blocker therapy as tolerated by heart rate and blood pressure. Given relatively low blood pressures upon leaving the Cath Lab, this therapy will be instituted by a team member.  Overall plan for left main disease is conservative medical management given age and other medical problems. This can be debated.`   Echo 01/29/2019 IMPRESSIONS   1. Moderate hypokinesis of the left ventricular, basal-mid inferior wall and inferolateral wall. 2. The left ventricle has mild-moderately reduced systolic function, with an ejection fraction of 40-45%. The cavity size was normal. Left ventricular diastolic Doppler parameters are consistent with impaired relaxation. Elevated mean left atrial  pressure. 3. The right ventricle has normal systolic function. The cavity was normal. There is no increase in right ventricular wall thickness. 4. The mitral valve is degenerative. There is moderate mitral annular calcification present. 5. Mild thickening of the aortic valve. Sclerosis without any evidence of stenosis of the aortic valve. Aortic valve regurgitation is mild by color flow Doppler. 6. The aorta is abnormal in size and structure. 7. There is mild dilatation of the ascending aorta. _____________   Current Meds  Medication Sig  . ALPRAZolam (XANAX) 1 MG tablet TAKE 1/2 TABLET BY MOUTH 2 TIMES DAILY AS NEEDED FOR ANXIETY.  Marland Kitchen anastrozole (ARIMIDEX) 1 MG tablet Take 1 tablet (1 mg total) by mouth daily.  Marland Kitchen aspirin 81 MG chewable  tablet Chew 1 tablet (81 mg total) by mouth daily.  Marland Kitchen atorvastatin (LIPITOR) 80 MG tablet Take 1 tablet (80 mg total) by mouth daily at 6 PM.  . bisoprolol (ZEBETA) 5 MG tablet Take 0.5 tablets (2.5 mg total) by mouth daily.  . Calcium Carbonate (CALTRATE 600 PO) Take 600 mg by mouth daily.   . cholecalciferol (VITAMIN D) 1000 units tablet Take 1 tablet (1,000 Units total) by mouth daily.  . diclofenac sodium (VOLTAREN) 1 % GEL APPLY 4 GRAMS TO AFFECTED AREA 2 TIMES A DAY.  Marland Kitchen docusate sodium (COLACE) 100 MG capsule Take 1 capsule (100 mg total) by mouth daily as needed for mild constipation.  . DULoxetine (CYMBALTA) 60 MG capsule TAKE 1 CAPSULE BY MOUTH DAILY  . ferrous sulfate (FERROUSUL) 325 (65 FE) MG tablet Take 1 tablet (325 mg total) by mouth daily with breakfast.  . gabapentin (NEURONTIN) 300 MG capsule 2 tabs in the AM and 1 tab in the afternoon  . hydroxypropyl methylcellulose / hypromellose (ISOPTO TEARS / GONIOVISC) 2.5 % ophthalmic solution Place 1 drop into both eyes 3 (three) times daily as needed for dry eyes.  Marland Kitchen levothyroxine (SYNTHROID, LEVOTHROID) 112 MCG tablet TAKE 1 TABLET BY MOUTH DAILY BEFORE BREAKFAST.  Marland Kitchen losartan (COZAAR) 25 MG tablet Take 0.5 tablets (12.5 mg total) by mouth daily.  . Misc Natural Products (COLON CLEANSE) CAPS Take 1 capsule by mouth 2 (two) times a day.  . Multiple Vitamin (MULTIVITAMIN WITH MINERALS) TABS tablet Take 1 tablet by mouth daily.  . Multiple Vitamins-Minerals (OCUVITE PRESERVISION PO) Take 1 tablet by mouth daily.  . nitroGLYCERIN (NITROSTAT) 0.4 MG SL tablet Place 1 tablet (0.4 mg total) under the tongue every 5 (five) minutes as needed for chest pain.  Marland Kitchen omeprazole (PRILOSEC) 20 MG capsule Take 1 capsule (20 mg total) by mouth 2 (two) times daily before a meal.  . OXYGEN Inhale 3-3.5 L into the lungs continuous.  . ticagrelor (BRILINTA) 90 MG TABS tablet Take 1 tablet (90 mg total) by mouth 2 (two) times daily.  Marland Kitchen tiotropium (SPIRIVA  HANDIHALER) 18 MCG inhalation capsule PLACE 1 CAPSULE INTO INHALER AND INHALE ONCE A DAY  . traZODone (DESYREL) 50 MG tablet Take 3 tablets (150 mg total) by mouth at bedtime as needed for sleep.   Allergies  Allergen Reactions  . Doxycycline Other (See Comments)    GI upset  . Sulfadiazine Other (See Comments)    REACTION: Fever, aches  . Sulfa Antibiotics Rash   Past Medical History:  Diagnosis Date  . Allergic rhinitis, cause unspecified   . Allergy, unspecified not elsewhere classified   . Anxiety   . Asthma   . Chronic kidney disease    frequency  . COPD (chronic obstructive pulmonary disease) ([place])   . Degeneration of intervertebral disc, site unspecified   . Diverticulosis of colon (without mention of hemorrhage)   . Esophageal reflux   . Headache   . Insomnia, unspecified   . Myalgia and myositis, unspecified   . Need for prophylactic hormone replacement therapy (postmenopausal)   . Osteoarthritis of right knee 08/27/2011   R knee pain, s/p arthroscopy 2012 per [name]   . Osteoporosis, unspecified   . Other abnormal blood chemistry   .  Other and unspecified hyperlipidemia   . Other chronic pain   . Stroke [facility])    mini strokes  . Unspecified essential hypertension   . Unspecified hypothyroidism    Family History  Problem Relation Age of Onset  . Lung cancer Brother        x2  . Hypertension Father   . Heart disease Father   . Heart disease Mother   . Asthma Sister    Past Surgical History:  Procedure Laterality Date  . BACK SURGERY    . breast cystectomy    . CORONARY/GRAFT ACUTE MI REVASCULARIZATION N/A 01/29/2019   Procedure: CORONARY/GRAFT ACUTE MI REVASCULARIZATION;  Surgeon: Belva Crome, MD;  Location: [facility] [facility];  Service: Cardiovascular;  Laterality: N/A;  . JOINT REPLACEMENT     shoulder right  . KNEE ARTHROSCOPY     Right knee 2012  . MASS EXCISION Left 04/07/2015   Procedure: MINOR EXCISION OF MASS LEFT SMALL FINGER;  Surgeon:  Leanora Cover, MD;  Location: [facility];  Service: Orthopedics;  Laterality: Left;  . SHOULDER SURGERY    . TOTAL KNEE ARTHROPLASTY  12/11/2011   Procedure: TOTAL KNEE ARTHROPLASTY;  Surgeon: Johnny Bridge, MD;  Location: [facility];  Service: Orthopedics;  Laterality: Right;  . UMBILICAL HERNIA REPAIR N/A 06/09/2013   Procedure: EXPLORATIORY LAPAROTOMY, HERNIA REPAIR UMBILICAL, INSERTION OF MESH;  Surgeon: Haywood Lasso, MD;  Location: [employer];  Service: General;  Laterality: N/A;   Social History   Socioeconomic History  . Marital status: Married    Spouse name: Not on file  . Number of children: Not on file  . Years of education: Not on file  . Highest education level: Not on file  Occupational History  . Occupation: Housewife  Social Needs  . Financial resource strain: Not on file  . Food insecurity    Worry: Not on file    Inability: Not on file  . Transportation needs    Medical: Not on file    Non-medical: Not on file  Tobacco Use  . Smoking status: Former Smoker    Packs/day: 1.50    Years: 10.00    Pack years: 15.00    Types: Cigarettes    Quit date: 07/09/1985    Years since quitting: 33.6  . Smokeless tobacco: Never Used  . Tobacco comment: 1 1/2 ppd x 20 years  Substance and Sexual Activity  . Alcohol use: No    Alcohol/week: 0.0 standard drinks  . Drug use: No  . Sexual activity: Never  Lifestyle  . Physical activity    Days per week: Not on file    Minutes per session: Not on file  . Stress: Not on file  Relationships  . Social Herbalist on phone: Not on file    Gets together: Not on file    Attends religious service: Not on file    Active member of club or organization: Not on file    Attends meetings of clubs or organizations: Not on file    Relationship status: Not on file  . Intimate partner violence    Fear of current or ex partner: Not on file    Emotionally abused: Not on file    Physically abused: Not on file    Forced  sexual activity: Not on file  Other Topics Concern  . Not on file  Social History Narrative   Widowed 2019 after 60 years of marriage.  Lipid Panel     Component Value Date/Time   CHOL 207 (H) 01/29/2019 1252   TRIG 108 01/29/2019 1252   HDL 44 01/29/2019 1252   CHOLHDL 4.7 01/29/2019 1252   VLDL 22 01/29/2019 1252   LDLCALC 141 (H) 01/29/2019 1252   LDLDIRECT 157.9 03/12/2007 1103    Review of Systems: General: negative for chills, fever, night sweats or weight changes.  Cardiovascular: negative for chest pain, dyspnea on exertion, edema, orthopnea, palpitations, paroxysmal nocturnal dyspnea or shortness of breath Dermatological: negative for rash Respiratory: negative for cough or wheezing Urologic: negative for hematuria Abdominal: negative for nausea, vomiting, diarrhea, bright red blood per rectum, melena, or hematemesis Neurologic: negative for visual changes, syncope, or dizziness All other systems reviewed and are otherwise negative except as noted above.   Physical Exam:  Blood pressure 120/70, pulse 74, height 5' (1.524 m), weight 145 lb (65.8 kg), SpO2 94 %.  General appearance: alert, cooperative and no distress Neck: no carotid bruit and no JVD Lungs: clear to auscultation bilaterally Heart: regular rate and rhythm, S1, S2 normal, no murmur, click, rub or gallop Extremities: extremities normal, atraumatic, no cyanosis or edema Pulses: 2+ and symmetric Skin: Skin color, texture, turgor normal. No rashes or lesions Neurologic: Grossly normal  EKG not performed-- personally reviewed   ASSESSMENT AND PLAN:   1.  CAD: Status post acute STEMI 01/29/2019 secondary to thrombotic occlusion to mid distal RCA treated with PCI utilizing a drug-eluting stent.  There was also a 60% distal left main, 50% mid LAD, 70% distal LAD and 70% OM1.  Left main and OM disease will be managed medically.  She is doing well revascularization without any recurrent anginal  symptomatology.  Blood pressure and heart rate well controlled.  Will continue on dual antiplatelet therapy with aspirin and Brilinta along with high intensity statin, beta-blocker and ARB.  2.  Ischemic cardiomyopathy: EF post STEMI mildly reduced at 40 to 45%.  She is euvolemic on physical exam.  She has chronic dyspnea due to COPD and has been on supplemental oxygen for the past 10 years however no increased dyspnea beyond her baseline.  Continue treatment with ARB and beta-blocker. Will plan to check BMP today due to new ARB initiation. BP too soft to titrate meds.   3.  Hyperlipidemia: Recent LDL elevated 141 mg/dL.  LDL goal is less than 70.  She was recently started on high-dose statin therapy with atorvastatin 80 mg.  Tolerating well without side effects.  We will plan to repeat fasting lipid panel and hepatic function test again in 6 weeks.   Follow-Up with Dr. Tamala Julian in 2 to 3 months  Brittainy Ladoris Gene, MHS [facility] 02/05/2019 12:40 PM

## 2019-02-05 NOTE — Patient Instructions (Signed)
Medication Instructions:  none If you need a refill on your cardiac medications before your next appointment, please call your pharmacy.   Lab work:TODAY BMET  PLEASE SCHEDULE 6 WEEKS:HFT/FLP   If you have labs (blood work) drawn today and your tests are completely normal, you will receive your results only by: Marland Kitchen MyChart Message (if you have MyChart) OR . A paper copy in the mail If you have any lab test that is abnormal or we need to change your treatment, we will call you to review the results.  Testing/Procedures: NONE  Follow-Up:2-3 MONTHS Dr Tamala Julian At [facility], you and your health needs are our priority.  As part of our continuing mission to provide you with exceptional heart care, we have created designated Provider Care Teams.  These Care Teams include your primary Cardiologist (physician) and Advanced Practice Providers (APPs -  Physician Assistants and Nurse Practitioners) who all work together to provide you with the care you need, when you need it. .   Any Other Special Instructions Will Be Listed Below (If Applicable).

## 2019-02-06 ENCOUNTER — Telehealth (HOSPITAL_COMMUNITY): Payer: Self-pay | Admitting: *Deleted

## 2019-02-06 NOTE — Telephone Encounter (Signed)
-----   Message from Belva Crome, MD sent at 02/06/2019  1:36 PM EDT ----- Regarding: RE: Ok for participation Onsite for Cardiac Rehb with High Covid Risk Score Okay to proceed. ----- Message ----- From: Rowe Pavy, RN Sent: 02/05/2019   4:27 PM EDT To: Belva Crome, MD Subject: [facility] for participation Onsite for Cardiac Rehb#  Greetings Dr. Tamala Julian,  We are seeing patients on-site for cardiac rehab . A great deal of planning with advisement from our Medical Director - Dr. Radford Pax, CV Service line leadership, Infection Disease Control, Facilities, security, recommendations from American Association of Cardiac and Pulmonary Rehab (AACVPR) with the goal for optimal patient safety. Patients will have strict guidelines and criteria they must adhere to and follow. Patients will wear a mask during exercise and practice social distancing. Patients will have to complete screening prior to entry into gym area.   Your patient expressed great interest in participating in facility cardiac rehab. Patient has a COVID-19 risk score of 7 Do you feel this patient is appropriate to resume exercise in facility cardiac rehab? Any additional restrictions you feel are appropriate for this patient?   Seen in follow up on 7/30 by Lyda Jester PA Wears oxygen for COPD.  Thank you and we appreciate your input  Maurice Small RN Cardiac Rehab Staff

## 2019-02-10 ENCOUNTER — Telehealth (HOSPITAL_COMMUNITY): Payer: Self-pay | Admitting: *Deleted

## 2019-02-10 NOTE — Telephone Encounter (Signed)
-----   Message from Tonia Ghent, MD sent at 02/10/2019  7:06 AM EDT ----- Regarding: RE: Acceptable Oxygen Saturation for Exercise I would use a minimum of 92% at rest and with exertion.  I would titrate up to 4L with exertion if needed.  Thanks for seeing her.  Let me know what I can do otherwise.   Brigitte Pulse ----- Message ----- From: Rowe Pavy, RN Sent: 02/05/2019   4:29 PM EDT To: Tonia Ghent, MD Subject: Acceptable Oxygen Saturation for Exercise       Dr. Damita Dunnings,  The above pt is eligible for participation in Cardiac Rehab s/p 01/29/19 STEMI and DES to RCA by Dr. Tamala Julian.  Noted in her medical history, pt uses 3L of oxygen for history of COPD.  What is an acceptable saturation for this pt at rest and on exertion? May we titrate her oxygen for maintaining the recommended saturation?   Thank you for your valued input  Cherre Huger, BSN Cardiac and Pulmonary Rehab Nurse Navigator

## 2019-02-11 ENCOUNTER — Encounter (HOSPITAL_COMMUNITY): Payer: Self-pay

## 2019-02-11 ENCOUNTER — Telehealth: Payer: Self-pay | Admitting: Family Medicine

## 2019-02-11 NOTE — Telephone Encounter (Signed)
Please call and check on patient.  I tried to call her today but I could not get through.  I tried to leave a message on her voicemail without leaving any confidential information but I am not sure the message went through.  I was not expecting her to have cardiac trouble like she did and I have been following along with the notes from the cardiac service and also from cardiac rehab.  I clearly hope she is feeling better.  Please let me know how she is doing and tell her that I wish her only the best.  Thanks.

## 2019-02-12 NOTE — Telephone Encounter (Signed)
Spoke with patient and advised her of everything as stated below. Patient states she feels like a new person as far as her heart and chest concerned. She ended up going to ER because she was having severe chest pain. The next morning when she woke up after cardiac procedure she felt great. She has a virtual visit for follow up set up on 02/16/2019.

## 2019-02-12 NOTE — Telephone Encounter (Signed)
Noted. Thanks.

## 2019-02-13 ENCOUNTER — Other Ambulatory Visit: Payer: Self-pay | Admitting: Family Medicine

## 2019-02-16 ENCOUNTER — Ambulatory Visit (INDEPENDENT_AMBULATORY_CARE_PROVIDER_SITE_OTHER): Payer: Medicare Other | Admitting: Family Medicine

## 2019-02-16 VITALS — BP 158/72 | Wt 140.0 lb

## 2019-02-16 DIAGNOSIS — I2119 ST elevation (STEMI) myocardial infarction involving other coronary artery of inferior wall: Secondary | ICD-10-CM | POA: Diagnosis not present

## 2019-02-16 DIAGNOSIS — R1310 Dysphagia, unspecified: Secondary | ICD-10-CM

## 2019-02-16 DIAGNOSIS — G894 Chronic pain syndrome: Secondary | ICD-10-CM | POA: Diagnosis not present

## 2019-02-16 NOTE — Progress Notes (Signed)
Virtual visit completed through WebEx or similar program Patient location: home  Provider location: Financial controller at [facility], office   Pandemic considerations d/w pt.   Limitations and rationale for visit method d/w patient.  Patient agreed to proceed.   CC: inpatient f/u.   HPI:  Inpatient course discussed with patient.  Discharge medications discussed along with rationale for treatment.  I was clearly not expecting her to have the troubles that she did and we discussed this.  She had a change in symptoms after the initial phone call with me and that led to the hospitalization.  She is on aspirin and brilinta with some bruising.  No bleeding.  The bruising is improving. Cautions d/w pt.    She is on low dose ARB and BB.  She isn't having lower BP. She isn't lightheaded on standing.   She is putting up with back pain at baseline.  She has some fatigue at baseline.  She has no CP.  She has SOB with exertion, with O2 requirement at baseline.  This isn't worse, "it's been this way."    She is not comfortable with going to cardiac rehab.  D/w pt.  She has concerns about cardiac rehab.  We talked about potential home health cardiac rehab.  She is going to consider [facility] cardiac rehab.    She is not yet due for recheck lipids and no ADE on statin. D/w pt.  She has f/u pending for 04/2019.    D/w pt about taking cymbalta use.  She was asking about stopping it but I would continue as is, d/w pt.   She has some dysphagia at baseline, with a feeling of food moving slowly but not totally sticking.  No vomiting.  Not vomiting blood.  She does okay with swallowing liquids.  We talked about cautions and possible GI input.  She will consider seeing GI.  Meds and allergies reviewed.   ROS: Per HPI unless specifically indicated in ROS section   NAD Speech wnl On O2 at baseline  A/P:  CAD.  Continue current medications for now.  We talked a lot about cardiac rehab.  She is not comfortable with going  to cardiac rehab.  D/w pt.  We talked about potential home health cardiac rehab.  She is going to consider [facility] cardiac rehab.  She will update me when she makes a decision 1 way or the other.  Family was with her when we were talking about this and is aware of the conversation.  She is not yet due for recheck lipids and she has no ADE on statin. D/w pt.  She has f/u pending for 04/2019.   D/w pt about taking cymbalta use.  She was asking about stopping it but I would continue as is, d/w pt.   She has some dysphagia at baseline, with a feeling of food moving slowly but not totally sticking.  No vomiting.  Not vomiting blood.  She does okay with swallowing liquids.  We talked about cautions and possible GI input.  She will consider seeing GI.  >25 minutes spent in face to face time with patient, >50% spent in counselling or coordination of care.

## 2019-02-18 DIAGNOSIS — R1310 Dysphagia, unspecified: Secondary | ICD-10-CM | POA: Insufficient documentation

## 2019-02-18 HISTORY — DX: Dysphagia, unspecified: R13.10

## 2019-02-18 NOTE — Assessment & Plan Note (Signed)
D/w pt about taking cymbalta use.  She was asking about stopping it but I would continue as is, d/w pt.

## 2019-02-18 NOTE — Assessment & Plan Note (Signed)
She has some dysphagia at baseline, with a feeling of food moving slowly but not totally sticking.  No vomiting.  Not vomiting blood.  She does okay with swallowing liquids.  We talked about cautions and possible GI input.  She will consider seeing GI.

## 2019-02-18 NOTE — Assessment & Plan Note (Signed)
No chest pain now.  Inpatient course discussed with patient.  Continue current medications for now.  We talked a lot about cardiac rehab.  She is not comfortable with going to cardiac rehab.  D/w pt.  We talked about potential home health cardiac rehab.  She is going to consider [facility] cardiac rehab.  She will update me when she makes a decision 1 way or the other.  Family was with her when we were talking about this and is aware of the conversation.  She is not yet due for recheck lipids and she has no ADE on statin. D/w pt.  She has f/u pending for 04/2019.   No change in meds at this point.

## 2019-02-24 ENCOUNTER — Other Ambulatory Visit: Payer: Self-pay

## 2019-02-24 ENCOUNTER — Ambulatory Visit
Admission: RE | Admit: 2019-02-24 | Discharge: 2019-02-24 | Disposition: A | Discharge status: Home / Self Care | Payer: Medicare Other | Source: Ambulatory Visit | Attending: Oncology | Admitting: Oncology

## 2019-02-24 DIAGNOSIS — Z170 Estrogen receptor positive status [ER+]: Secondary | ICD-10-CM

## 2019-02-24 DIAGNOSIS — Z9181 History of falling: Secondary | ICD-10-CM

## 2019-02-24 DIAGNOSIS — C50411 Malignant neoplasm of upper-outer quadrant of right female breast: Secondary | ICD-10-CM

## 2019-02-24 DIAGNOSIS — E559 Vitamin D deficiency, unspecified: Secondary | ICD-10-CM

## 2019-03-09 ENCOUNTER — Telehealth: Payer: Self-pay | Admitting: Oncology

## 2019-03-09 NOTE — Telephone Encounter (Signed)
Left message to verify telephone visit for pre reg °

## 2019-03-09 NOTE — Progress Notes (Signed)
[facility]  [phone] [phone]     ID: [name] DOB: 22-Dec-1930  MR#: [id number]  OTL#:[id number]  Patient Care Team: Tonia Ghent, MD as PCP - General Tamala Julian Lynnell Dike, MD as PCP - Cardiology (Cardiology) Kweku Stankey, Virgie Dad, MD as Consulting Physician (Oncology) [name], MD as Consulting Physician (Otolaryngology) Marshell Garfinkel, MD as Consulting Physician (Pulmonary Disease) Chauncey Cruel, MD OTHER MD:   CHIEF COMPLAINT: Estrogen receptor positive breast cancer  CURRENT TREATMENT: Anastrozole   I connected with [name] on 03/10/19 at 10:00 AM EDT by telephone visit (the patient and her daughter were not able to log onto the WebEx visit).  I verified that I am speaking with the correct person using two identifiers.   I discussed the limitations, risks, security and privacy concerns of performing an evaluation and management service by telemedicine and the availability of in-person appointments. I also discussed with the patient that there may be a patient responsible charge related to this service. The patient expressed understanding and agreed to proceed.   Other persons participating in the visit and their role in the encounter:   -The patient's daughter  Patients location: Home Providers location: Clinic    INTERVAL HISTORY: Dorthea is contacted today for follow-up and treatment of her estrogen receptor positive breast cancer. She was last seen here on 11/20/2018.  Her daughter [name] also participated in the visit  Shalan continues on anastrozole.  She tolerates this well with no side effects that she is aware of.  In particular hot flashes vaginal dryness and arthralgias and myalgias are no different than at baseline.  Norris's last bone density screening on 02/24/2019, showed a T-score of -4.4, which is considered osteoporotic.  This is discussed further below.  Since her last visit here, she  underwent an abdomen xray on 12/27/2018 for upper abdominal pain showing: Nonspecific mildly dilated small bowel in the right abdomen at 3.3 cm. Differential considerations include enteritis, ileus, or early obstruction.  She also underwent a chest xray on 12/27/2018 showing: No acute chest findings.  She then underwent an abdomen and pelvis CT with contrast on 12/27/2018 showing: Suggestion of mild wall thickening in the body and antrum of the stomach, cannot exclude gastritis or other etiologies. No evidence of bowel obstruction or acute bowel inflammation. Normal appendix. Moderate colonic stool volume suggests constipation. Mild left colonic diverticulosis, with no evidence of acute diverticulitis. Patchy tree-in-bud opacities at the left lung base, indicative nonspecific bronchiolitis, with the differential including aspiration. Increased 3.9 cm infrarenal abdominal Aortic Aneurysm (ICD10-I71.9). Recommend follow-up aortic ultrasound in 2 years. This recommendation follows ACR consensus guidelines: White Paper of the ACR Incidental Findings Committee II on Vascular Findings. J Am Coll Radiol 2013; 10:789-794. Finely irregular liver surface, cannot exclude appendix cirrhosis. No liver masses. Consider hepatic elastography for further liver fibrosis risk stratification, as clinically warranted. Markedly enlarged heterogeneous uterus, unchanged since 2014, presumably myomatous uterus. Aortic Atherosclerosis (ICD10-I70.0).  Finally, she underwent an ultrasound of the right breast on 02/24/2019 showing: On physical exam, a firm mass in the UPPER-OUTER QUADRANT of the RIGHT breast is palpated. The patient is focally tender in the RIGHT axilla. However, no axillary mass is palpated. Targeted ultrasound is performed, showing an irregular hypoechoic mass with tissue marker clip in the 11 o'clock location of the RIGHT breast 4 centimeters from the nipple. Mass measures 4.0 x 1.4 x 2.7 centimeters. Previously mass  measured 4.2 x 1.9 x 4.2 centimeters. Evaluation of  the RIGHT axilla shows lymph node with tissue marker clip artifact. This lymph node measures 2.5 x 1.1 x 1.2 centimeters. Previously this lymph node measured 3.3 x 3.1 x 1.2 centimeters.   REVIEW OF SYSTEMS: [place] is taking appropriate pandemic precautions.  She basically does not get out of the house.  She loves living by herself and not going anywhere.  She watches TV, reads, does puzzles, and so on.  She tells me that she cannot walk very well because her right leg is 1 inch shorter than the left and that this causes a little bit of swelling in the right ankle.  There is no redness however no heat.  She denies unusual headaches visual changes cough phlegm production pleurisy or shortness of breath or change in bowel or bladder habits.  Detailed review of systems today was otherwise stable.   HISTORY OF CURRENT ILLNESS: From the original intake note:  The patient noted a change in the upper outer right breast, as well as a palpable lump in the right axilla, sometime mid-to-late 2019.  She did discuss it briefly with her husband but he was not in good health and she did not want to worry him. She had not had a prior mammogram for better than 2 decades.  More recently she brought this to medical attention and on 09/01/2018 she underwent bilateral diagnostic mammography with tomography and bilateral breast and axillae ultrasonography at the breast center.  This found the breast density to be category B.  In the upper right breast there was an irregular mass with architectural distortion and 2 possible satellite masses.  In the right axilla there were 2 enlarged lymph nodes.  By physical exam there was a firm irregular mass in the upper outer quadrant of the right breast and a smooth easily palpable mass in the right axilla.  Ultrasound of the right breast confirmed a 4.2 cm irregular hypoechoic mass centered at 11:00.  There was no satellite mass seen.   In the right axilla there were 2 enlarged lymph nodes, as well as 1 with normal size but abnormal morphology.  The palpable enlarged lymph nodes measure 2.9 and 3.1 cm.  The third node measured 0.9 cm but had no hilar fat.  Biopsy of the right breast upper outer quadrant mass on 09/05/2018 showed ([id number]) invasive ductal carcinoma, grade 2, estrogen receptor 95% positive with strong staining intensity, estrogen receptor negative, HER-2 equivocal by immunohistochemistry but negative by FISH with a signals ratio of 1.16 and copy number per cell 1.80.  The MIB-1 was 15%  The right axillary lymph node biopsied was also positive for carcinoma.  On the left side there was a lucent round circumscribed mass in the upper outer quadrant.  By ultrasound this proved to be a fat necrosis oil cyst requiring no further follow-up  The patient's subsequent history is as detailed below.   PAST MEDICAL HISTORY: Past Medical History:  Diagnosis Date   Allergic rhinitis, cause unspecified    Allergy, unspecified not elsewhere classified    Anxiety    Asthma    Chronic kidney disease    frequency   COPD (chronic obstructive pulmonary disease) ([place])    Degeneration of intervertebral disc, site unspecified    Diverticulosis of colon (without mention of hemorrhage)    Esophageal reflux    Headache    Insomnia, unspecified    Myalgia and myositis, unspecified    Need for prophylactic hormone replacement therapy (postmenopausal)    Osteoarthritis of  right knee 08/27/2011   R knee pain, s/p arthroscopy 2012 per [name]    Osteoporosis, unspecified    Other abnormal blood chemistry    Other and unspecified hyperlipidemia    Other chronic pain    Stroke ([place])    mini strokes   Unspecified essential hypertension    Unspecified hypothyroidism     PAST SURGICAL HISTORY: Past Surgical History:  Procedure Laterality Date   BACK SURGERY     breast cystectomy      CORONARY/GRAFT ACUTE MI REVASCULARIZATION N/A 01/29/2019   Procedure: CORONARY/GRAFT ACUTE MI REVASCULARIZATION;  Surgeon: Belva Crome, MD;  Location: [facility];  Service: Cardiovascular;  Laterality: N/A;   JOINT REPLACEMENT     shoulder right   KNEE ARTHROSCOPY     Right knee 2012   MASS EXCISION Left 04/07/2015   Procedure: MINOR EXCISION OF MASS LEFT SMALL FINGER;  Surgeon: Leanora Cover, MD;  Location: [facility];  Service: Orthopedics;  Laterality: Left;   SHOULDER SURGERY     TOTAL KNEE ARTHROPLASTY  12/11/2011   Procedure: TOTAL KNEE ARTHROPLASTY;  Surgeon: Johnny Bridge, MD;  Location: [facility];  Service: Orthopedics;  Laterality: Right;   UMBILICAL HERNIA REPAIR N/A 06/09/2013   Procedure: EXPLORATIORY LAPAROTOMY, HERNIA REPAIR UMBILICAL, INSERTION OF MESH;  Surgeon: Haywood Lasso, MD;  Location: [facility];  Service: General;  Laterality: N/A;    FAMILY HISTORY Family History  Problem Relation Age of Onset   Lung cancer Brother        x2   Hypertension Father    Heart disease Father    Heart disease Mother    Asthma Sister   The patient's father died from a heart attack at age 58.  The patient's mother died suddenly at age 37 while in apparent good health.  The patient is the youngest of 6 siblings, the other ones all deceased.  One sister developed breast cancer in her 6s.  There is no other family history of breast, ovarian, pancreatic, or prostate cancer to the patient's knowledge   GYNECOLOGIC HISTORY:  No LMP recorded. Patient is postmenopausal. Menarche: 85 years old Age at first live birth: 85 years old [place] P 2 LMP approximately mid 55s HRT treated with estropitate ([facility]) for "many years" Hysterectomy?  No Salpingo-oophorectomy?  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  Her husband was a high school principal.  He is now deceased.  The patient has 2 daughters, Tomi Bamberger "[name]" [place], who lives in  [place] and is a retired Paediatric nurse; and Vernel Donlan, who works for [employer] and recently moved to [place].  The patient has 4 grandchildren.  One grandson unfortunately died in a motor vehicle accident.  The patient attends [employer].  Gerlene generally lives by herself but currently her daughter [name] is staying with her.   ADVANCED DIRECTIVES: The patient has named both her daughters as healthcare powers of attorney but currently daughter [name] would be first since she is the one in town   HEALTH MAINTENANCE: Social History   Tobacco Use   Smoking status: Former Smoker    Packs/day: 1.50    Years: 10.00    Pack years: 15.00    Types: Cigarettes    Quit date: 07/09/1985    Years since quitting: 33.6   Smokeless tobacco: Never Used   Tobacco comment: 1 1/2 ppd x 20 years  Substance Use  Topics   Alcohol use: No    Alcohol/week: 0.0 standard drinks   Drug use: No     Colonoscopy: n/a  PAP: n/a  Bone density: Remote   Allergies  Allergen Reactions   Doxycycline Other (See Comments)    GI upset   Sulfadiazine Other (See Comments)    REACTION: Fever, aches   Sulfa Antibiotics Rash    Current Outpatient Medications  Medication Sig Dispense Refill   alendronate (FOSAMAX) 70 MG tablet Take 1 tablet (70 mg total) by mouth once a week. Take with a full glass of water on an empty stomach. 4 tablet 6   ALPRAZolam (XANAX) 1 MG tablet TAKE 1/2 TABLET BY MOUTH 2 TIMES DAILY AS NEEDED FOR ANXIETY. 30 tablet 2   anastrozole (ARIMIDEX) 1 MG tablet Take 1 tablet (1 mg total) by mouth daily. 90 tablet 4   aspirin 81 MG chewable tablet Chew 1 tablet (81 mg total) by mouth daily. 30 tablet 0   atorvastatin (LIPITOR) 80 MG tablet Take 1 tablet (80 mg total) by mouth daily at 6 PM. 30 tablet 0   bisoprolol (ZEBETA) 5 MG tablet Take 0.5 tablets (2.5 mg total) by mouth daily. 30 tablet 0   Calcium Carbonate (CALTRATE 600 PO) Take 600 mg by  mouth daily.      cholecalciferol (VITAMIN D) 1000 units tablet Take 1 tablet (1,000 Units total) by mouth daily.     diclofenac sodium (VOLTAREN) 1 % GEL APPLY 4 GRAMS TO AFFECTED AREA 2 TIMES A DAY. 200 g 3   docusate sodium (COLACE) 100 MG capsule Take 1 capsule (100 mg total) by mouth daily as needed for mild constipation. 60 capsule 1   DULoxetine (CYMBALTA) 60 MG capsule TAKE 1 CAPSULE BY MOUTH DAILY 90 capsule 2   ferrous sulfate (FERROUSUL) 325 (65 FE) MG tablet Take 1 tablet (325 mg total) by mouth daily with breakfast.     gabapentin (NEURONTIN) 300 MG capsule 2 tabs in the AM and 1 tab in the afternoon 270 capsule 1   hydroxypropyl methylcellulose / hypromellose (ISOPTO TEARS / GONIOVISC) 2.5 % ophthalmic solution Place 1 drop into both eyes 3 (three) times daily as needed for dry eyes.     levothyroxine (SYNTHROID, LEVOTHROID) 112 MCG tablet TAKE 1 TABLET BY MOUTH DAILY BEFORE BREAKFAST. 90 tablet 1   losartan (COZAAR) 25 MG tablet Take 0.5 tablets (12.5 mg total) by mouth daily. 30 tablet 2   Multiple Vitamin (MULTIVITAMIN WITH MINERALS) TABS tablet Take 1 tablet by mouth daily.     Multiple Vitamins-Minerals (OCUVITE PRESERVISION PO) Take 1 tablet by mouth daily.     nitroGLYCERIN (NITROSTAT) 0.4 MG SL tablet Place 1 tablet (0.4 mg total) under the tongue every 5 (five) minutes as needed for chest pain. 25 tablet 12   omeprazole (PRILOSEC) 20 MG capsule Take 1 capsule (20 mg total) by mouth 2 (two) times daily before a meal. 60 capsule 3   OXYGEN Inhale 3-3.5 L into the lungs continuous.     ticagrelor (BRILINTA) 90 MG TABS tablet Take 1 tablet (90 mg total) by mouth 2 (two) times daily. 180 tablet 3   tiotropium (SPIRIVA HANDIHALER) 18 MCG inhalation capsule PLACE 1 CAPSULE INTO INHALER AND INHALE ONCE A DAY 30 capsule 12   traZODone (DESYREL) 50 MG tablet Take 3 tablets (150 mg total) by mouth at bedtime as needed for sleep. 90 tablet 6   No current  facility-administered medications for this visit.  OBJECTIVE: He white woman who appears well  There were no vitals filed for this visit. Wt Readings from Last 3 Encounters:  02/16/19 140 lb (63.5 kg)  02/05/19 145 lb (65.8 kg)  02/01/19 141 lb 4.8 oz (64.1 kg)   There is no height or weight on file to calculate BMI.    ECOG FS:2 - Symptomatic, <50% confined to bed  Telehealth Visit   LAB RESULTS:  CMP     Component Value Date/Time   NA 144 02/05/2019 1019   K 4.4 02/05/2019 1019   CL 101 02/05/2019 1019   CO2 29 02/05/2019 1019   GLUCOSE 107 (H) 02/05/2019 1019   GLUCOSE 93 01/30/2019 0240   BUN 13 02/05/2019 1019   CREATININE 0.59 02/05/2019 1019   CREATININE 0.62 08/22/2018 1625   CALCIUM 9.0 02/05/2019 1019   PROT 5.8 (L) 01/30/2019 0240   ALBUMIN 3.1 (L) 01/30/2019 0240   AST 157 (H) 01/30/2019 0240   ALT 24 01/30/2019 0240   ALKPHOS 35 (L) 01/30/2019 0240   BILITOT 0.7 01/30/2019 0240   GFRNONAA 84 02/05/2019 1019   GFRAA 97 02/05/2019 1019    No results found for: TOTALPROTELP, ALBUMINELP, A1GS, A2GS, BETS, BETA2SER, GAMS, MSPIKE, SPEI  No results found for: KPAFRELGTCHN, LAMBDASER, [facility]  Lab Results  Component Value Date   WBC 5.5 01/30/2019   NEUTROABS 2.7 12/27/2018   HGB 11.9 (L) 01/30/2019   HCT 37.9 01/30/2019   MCV 101.6 (H) 01/30/2019   PLT 192 01/30/2019      Chemistry      Component Value Date/Time   NA 144 02/05/2019 1019   K 4.4 02/05/2019 1019   CL 101 02/05/2019 1019   CO2 29 02/05/2019 1019   BUN 13 02/05/2019 1019   CREATININE 0.59 02/05/2019 1019   CREATININE 0.62 08/22/2018 1625      Component Value Date/Time   CALCIUM 9.0 02/05/2019 1019   ALKPHOS 35 (L) 01/30/2019 0240   AST 157 (H) 01/30/2019 0240   ALT 24 01/30/2019 0240   BILITOT 0.7 01/30/2019 0240       No results found for: LABCA2  No components found for: [id number]  No results for input(s): INR in the last 168 hours.  No results found for:  LABCA2  No results found for: ION629  No results found for: BMW413  No results found for: KGM010  No results found for: CA2729  No components found for: HGQUANT  No results found for: CEA1 / No results found for: CEA1   No results found for: AFPTUMOR  No results found for: CHROMOGRNA  No results found for: PSA1  No visits with results within 3 Day(s) from this visit.  Latest known visit with results is:  Office Visit on 02/05/2019  Component Date Value Ref Range Status   Glucose 02/05/2019 107* 65 - 99 mg/dL Final   BUN 02/05/2019 13  8 - 27 mg/dL Final   Creatinine, Ser 02/05/2019 0.59  0.57 - 1.00 mg/dL Final   GFR calc non Af Amer 02/05/2019 84  >59 mL/min/1.73 Final   GFR calc Af Amer 02/05/2019 97  >59 mL/min/1.73 Final   BUN/Creatinine Ratio 02/05/2019 22  12 - 28 Final   Sodium 02/05/2019 144  134 - 144 mmol/L Final   Potassium 02/05/2019 4.4  3.5 - 5.2 mmol/L Final   Chloride 02/05/2019 101  96 - 106 mmol/L Final   CO2 02/05/2019 29  20 - 29 mmol/L Final   Calcium 02/05/2019 9.0  8.7 -  10.3 mg/dL Final    (this displays the last labs from the last 3 days)  No results found for: TOTALPROTELP, ALBUMINELP, A1GS, A2GS, BETS, BETA2SER, GAMS, MSPIKE, SPEI (this displays SPEP labs)  No results found for: KPAFRELGTCHN, LAMBDASER, KAPLAMBRATIO (kappa/lambda light chains)  No results found for: HGBA, HGBA2QUANT, HGBFQUANT, HGBSQUAN (Hemoglobinopathy evaluation)   No results found for: LDH  Lab Results  Component Value Date   IRON 34 (L) 08/22/2018   TIBC 294 08/22/2018   IRONPCTSAT 12 (L) 08/22/2018   (Iron and TIBC)  No results found for: FERRITIN  Urinalysis    Component Value Date/Time   COLORURINE YELLOW 12/27/2018 0217   APPEARANCEUR CLEAR 12/27/2018 0217   LABSPEC 1.012 12/27/2018 0217   PHURINE 6.0 12/27/2018 0217   GLUCOSEU NEGATIVE 12/27/2018 0217   GLUCOSEU NEGATIVE 10/24/2016 [place] 12/27/2018 0217   BILIRUBINUR  NEGATIVE 12/27/2018 0217   BILIRUBINUR negative 09/20/2016 1625   KETONESUR NEGATIVE 12/27/2018 0217   PROTEINUR NEGATIVE 12/27/2018 0217   UROBILINOGEN 0.2 10/24/2016 1429   NITRITE NEGATIVE 12/27/2018 0217   LEUKOCYTESUR NEGATIVE 12/27/2018 0217     STUDIES: Dg Bone Density  Result Date: 02/24/2019 EXAM: DUAL X-RAY ABSORPTIOMETRY (DXA) FOR BONE MINERAL DENSITY IMPRESSION: Referring Physician:  Chauncey Cruel Your patient completed a BMD test using Lunar IDXA DXA system ( analysis version: 16 ) manufactured by [employer]. Technologist: AW PATIENT: Name: Stanislawa, Gaffin Patient ID: [id number] Birth Date: 1931/02/16 Height: 60.0 in. Sex: Female Measured: 02/24/2019 Weight: 140.0 lbs. Indications: Advanced Age, Anastrazole, Breast Cancer History, Caucasian, cymbalta, Desyrel, Estrogen Deficient, Gabapentin, History of Osteoporosis, Hypothyroid, Levothyroxine, Postmenopausal Fractures: Treatments: Calcium (E943.0), Hormone Therapy For Cancer, Vitamin D (E933.5) ASSESSMENT: The BMD measured at Femur Total Right is 0.459 g/cm2 with a T-score of -4.4. This patient is considered osteoporotic according to [facility]) criteria. The scan quality is good. Lumbar spine was not utilized due to advanced degenerative changes. Site Region Measured Date Measured Age YA BMD Significant CHANGE T-score DualFemur Total Right 02/24/2019 86.0 -4.4 0.459 g/cm2 DualFemur Total Mean 02/24/2019 86.0 -3.9 0.522 g/cm2 Left Forearm Radius 33% 02/24/2019 86.0 -3.5 0.574 g/cm2 World Health Organization [facility]) criteria for post-menopausal, Caucasian Women: Normal       T-score at or above -1 SD Osteopenia   T-score between -1 and -2.5 SD Osteoporosis T-score at or below -2.5 SD RECOMMENDATION: 1. All patients should optimize calcium and vitamin D intake. 2. Consider FDA approved medical therapies in postmenopausal women and men aged 33 years and older, based on the following: a. A hip or vertebral (clinical or  morphometric) fracture b. T- score < or = -2.5 at the femoral neck or spine after appropriate evaluation to exclude secondary causes c. Low bone mass (T-score between -1.0 and -2.5 at the femoral neck or spine) and a 10 year probability of a hip fracture > or = 3% or a 10 year probability of a major osteoporosis-related fracture > or = 20% based on the [place]-adapted WHO algorithm d. Clinician judgment and/or patient preferences may indicate treatment for people with 10-year fracture probabilities above or below these levels FOLLOW-UP: Patients with diagnosis of osteoporosis or at high risk for fracture should have regular bone mineral density tests. For patients eligible for Medicare, routine testing is allowed once every 2 years. The testing frequency can be increased to one year for patients who have rapidly progressing disease, those who are receiving or discontinuing medical therapy to restore bone mass, or have  additional risk factors. I have reviewed this report and agree with the above findings. [facility] Radiology Electronically Signed   By: Franki Cabot M.D.   On: 02/24/2019 12:34   US Breast Ltd Uni Right Inc Axilla  Result Date: 02/24/2019 CLINICAL DATA:  Patient takes anastrozole for known RIGHT breast cancer. Assess treatment response. Patient reports significant improvement on her physical exam. EXAM: ULTRASOUND OF THE RIGHT BREAST COMPARISON:  09/05/2018 and earlier FINDINGS: On physical exam, I palpate a firm mass in the UPPER-OUTER QUADRANT of the RIGHT breast. The patient is focally tender in the RIGHT axilla. However, I palpate no axillary mass. Targeted ultrasound is performed, showing an irregular hypoechoic mass with tissue marker clip in the 11 o'clock location of the RIGHT breast 4 centimeters from the nipple. Mass measures 4.0 x 1.4 x 2.7 centimeters. Previously mass measured 4.2 x 1.9 x 4.2 centimeters. Evaluation of the RIGHT axilla shows lymph node with tissue marker clip artifact.  This lymph node measures 2.5 x 1.1 x 1.2 centimeters. Previously this lymph node measured 3.3 x 3.1 x 1.2 centimeters. IMPRESSION: Improved RIGHT axillary adenopathy and RIGHT breast mass. RECOMMENDATION: Treatment plan for known RIGHT breast cancer. I have discussed the findings and recommendations with the patient. Results were also provided in writing at the conclusion of the visit. If applicable, a reminder letter will be sent to the patient regarding the next appointment. BI-RADS CATEGORY  6: Known biopsy-proven malignancy. Electronically Signed   By: Nolon Nations M.D.   On: 02/24/2019 13:22    ELIGIBLE FOR AVAILABLE RESEARCH PROTOCOL: no  ASSESSMENT: 85 y.o. [place] woman status post right breast upper outer quadrant biopsy 09/05/2018 for a clinical T2 N1, stage IIB invasive ductal carcinoma, grade 2, estrogen receptor positive, progesterone receptor negative, HER-2 not amplified, with an MIB-1 of 15%.  (1) anastrozole started 10/09/2018  (a) bone density 02/24/2019 shows osteoporosis with a T score of -4.4  (b) Fosamax/alendronate started 03/10/2019  PLAN: Marysa is now a little over 6 months out from initial diagnosis of breast cancer.  She has opted for antiestrogens and no surgery or radiation.  We are following her with every 22-monthultrasound and her ultrasound 2 weeks ago shows continuing response.  Accordingly we are not changing her treatment.  The plan is to continue anastrozole so long as there is evidence of disease control and when that changes then consider surgery  On the other hand she has significant osteoporosis.  Today we discussed alendronate and she will start that this week.  She understands she needs to take it on an empty stomach with a glass of water and remain vertical for at least an hour after taking the medication.  If she has any problems from that she will let uKoreaknow  Otherwise she will have repeat mammography and left breast ultrasonography at the end  of February 2021 and we will have another virtual visit in March 2021  She knows to call for any other issue that may develop before then  MChauncey Cruel MD  03/10/19 10:13 AM Medical Oncology and Hematology [facility] [place] [id number]. [phone]   Fax. [phone]  I, AJacqualyn Poseyam acting as a sEducation administratorfor GChauncey Cruel MD.   I, GLurline DelMD, have reviewed the above documentation for accuracy and completeness, and I agree with the above.

## 2019-03-09 NOTE — Telephone Encounter (Signed)
Called patient regarding upcoming Webex appointment, patient is notified and e-mail has been sent. °

## 2019-03-10 ENCOUNTER — Inpatient Hospital Stay: Payer: Medicare Other | Attending: Oncology | Admitting: Oncology

## 2019-03-10 DIAGNOSIS — K219 Gastro-esophageal reflux disease without esophagitis: Secondary | ICD-10-CM

## 2019-03-10 DIAGNOSIS — C50411 Malignant neoplasm of upper-outer quadrant of right female breast: Secondary | ICD-10-CM | POA: Diagnosis not present

## 2019-03-10 DIAGNOSIS — M818 Other osteoporosis without current pathological fracture: Secondary | ICD-10-CM

## 2019-03-10 DIAGNOSIS — Z79811 Long term (current) use of aromatase inhibitors: Secondary | ICD-10-CM

## 2019-03-10 DIAGNOSIS — E785 Hyperlipidemia, unspecified: Secondary | ICD-10-CM

## 2019-03-10 DIAGNOSIS — Z87891 Personal history of nicotine dependence: Secondary | ICD-10-CM

## 2019-03-10 DIAGNOSIS — F419 Anxiety disorder, unspecified: Secondary | ICD-10-CM

## 2019-03-10 DIAGNOSIS — Z170 Estrogen receptor positive status [ER+]: Secondary | ICD-10-CM

## 2019-03-10 DIAGNOSIS — Z79899 Other long term (current) drug therapy: Secondary | ICD-10-CM

## 2019-03-10 MED ORDER — ALENDRONATE SODIUM 70 MG PO TABS: 70.0000 mg | ORAL_TABLET | ORAL | 6 refills | Status: DC

## 2019-03-10 MED ORDER — ANASTROZOLE 1 MG PO TABS: 1.0000 mg | ORAL_TABLET | Freq: Every day | ORAL | 4 refills | Status: DC

## 2019-03-19 ENCOUNTER — Other Ambulatory Visit: Payer: Medicare Other | Admitting: *Deleted

## 2019-03-19 ENCOUNTER — Other Ambulatory Visit: Payer: Self-pay

## 2019-03-19 DIAGNOSIS — Z79899 Other long term (current) drug therapy: Secondary | ICD-10-CM

## 2019-03-19 DIAGNOSIS — E785 Hyperlipidemia, unspecified: Secondary | ICD-10-CM

## 2019-03-19 LAB — LIPID PANEL
Chol/HDL Ratio: 2.6 ratio (ref 0.0–4.4)
Cholesterol, Total: 124 mg/dL (ref 100–199)
HDL: 47 mg/dL (ref >39)
LDL Chol Calc (NIH): 62 mg/dL (ref 0–99)
Triglycerides: 72 mg/dL (ref 0–149)
VLDL Cholesterol Cal: 15 mg/dL (ref 5–40)

## 2019-03-19 LAB — HEPATIC FUNCTION PANEL
ALT: 17 IU/L (ref 0–32)
AST: 37 IU/L (ref 0–40)
Albumin: 4.1 g/dL (ref 3.6–4.6)
Alkaline Phosphatase: 47 IU/L (ref 39–117)
Bilirubin Total: 0.3 mg/dL (ref 0.0–1.2)
Bilirubin, Direct: 0.13 mg/dL (ref 0.00–0.40)
Total Protein: 6.6 g/dL (ref 6.0–8.5)

## 2019-03-20 ENCOUNTER — Telehealth: Payer: Self-pay

## 2019-03-20 NOTE — Telephone Encounter (Signed)
Notes recorded by Frederik Schmidt, RN on 03/20/2019 at 4:08 PM EDT  The patient has been notified of the result and verbalized understanding. All questions (if any) were answered.  Frederik Schmidt, RN 03/20/2019 4:08 PM

## 2019-03-20 NOTE — Telephone Encounter (Signed)
-----   Message from Consuelo Pandy, [place] sent at 03/20/2019  3:43 PM EDT ----- Cholesterol is well controlled w/ medication and liver function is normal. Continue statin. No changes to regimen at this time.

## 2019-03-23 ENCOUNTER — Telehealth: Payer: Self-pay

## 2019-03-23 NOTE — Telephone Encounter (Addendum)
I spoke with pt and she is very short of breath due to going to answer phone.after pt had rested for a few mins pt said that she has had rt hip pain for at least 1 month; sometimes the lt hip hurts also; pt is not sure what is causing pain but acetaminophen is not helping pain. Pt request a pain med that will help her hip pain. pt refused to schedule an in office visit; pt said she cannot get out of house due to pain. Pt said she had done a virtual visit before and pt scheduled virtual visit with Dr Damita Dunnings on 03/24/19 at  11:30. Pt is not sure if had xrays of hips before. ED & UC precautions given and pt voiced understanding. FYI to Dr Damita Dunnings.Pt has no covid symptoms except SOB which pt has a lot; no travel and no known exposure to + covid.

## 2019-03-23 NOTE — Telephone Encounter (Signed)
Pt left v/m requesting cb from nurse; that was the entire message. Left v/m for pt to cb.

## 2019-03-24 ENCOUNTER — Ambulatory Visit (INDEPENDENT_AMBULATORY_CARE_PROVIDER_SITE_OTHER): Payer: Medicare Other | Admitting: Family Medicine

## 2019-03-24 DIAGNOSIS — R609 Edema, unspecified: Secondary | ICD-10-CM | POA: Diagnosis not present

## 2019-03-24 DIAGNOSIS — R102 Pelvic and perineal pain: Secondary | ICD-10-CM

## 2019-03-24 DIAGNOSIS — R0602 Shortness of breath: Secondary | ICD-10-CM | POA: Diagnosis not present

## 2019-03-24 MED ORDER — TRAMADOL HCL 50 MG PO TABS: 50.0000 mg | ORAL_TABLET | Freq: Two times a day (BID) | ORAL | 0 refills | Status: DC | PRN

## 2019-03-24 NOTE — Telephone Encounter (Signed)
Noted.  I talked with Rena about her situation.  The patient has some baseline shortness of breath/oxygen requirement.  She was rushing to get to the phone and was transiently short of breath from that but that resolved.  The main issue was the hip pain and we will see her at the visit.  Thanks.

## 2019-03-24 NOTE — Progress Notes (Signed)
Interactive audio and video telecommunications were attempted between this provider and patient, however failed, due to patient having technical difficulties OR patient did not have access to video capability.  We continued and completed visit with audio only.   Virtual Visit via Telephone Note  I connected with patient on 03/24/19  at 11:50 AM  by telephone and verified that I am speaking with the correct person using two identifiers.  Location of patient: home  Location of MD: [place] Name of referring provider (if blank then none associated): Names per persons and role in encounter:  MD: Earlyne Iba, Patient: name listed above.    I discussed the limitations, risks, security and privacy concerns of performing an evaluation and management service by telephone and the availability of in person appointments. I also discussed with the patient that there may be a patient responsible charge related to this service. The patient expressed understanding and agreed to proceed.  CC hip pain.   History of Present Illness:  Her breathing is "not very good" and "I've been on oxygen for years and it is getting worse over the years."  We talked about pulmonary follow up when possible.  I asked about trying to get a phone visit set up with pulmonary and she said she would consider that and update me a needed/when she made a decision.  She has a cough at baseline.   "Hip pain" B pain but R>L, actually on the buttock area.  Tender locally.  She has to sit a lot.  She is sitting on a soft pillow at baseline.  No radiating leg pain, but she has some leg pain from fibromyalgia at baseline.  No rash.  No falls. No burning with urination.  She has some baseline episodic constipation without relief with dulcolax.  She is taking OTC stools softeners with some relief.  Longstanding, going on for "quite a while."  She is taking tylenol with minimal relief.  Walking with walker, pain walking.      Observations/Objective: nad Speech wnl.  Not SOB on the call.    Assessment and Plan:  She has had some episodic shortness of breath that is longstanding.  She has a history of supplemental oxygen dependence and previous MI.  Pelvic pain, likely not hip pain.  Discussed about using tramadol with routine cautions in the meantime.  This is likely exacerbated by prolonged sitting/relative deconditioning.  We talked about getting a home visit done.  I went to her house on 03/25/2019.  I followed usual infection control measures with gloves mask and goggles.  We talked about her situation.  Her heart sounded regular, her lungs are clear.  She did not have midline back pain but she had bilateral left greater than right lower back pain near the SI joints.  She did not have pain on hip range of motion.  Greater trochanteric bursa was not tender bilaterally.  She did have bilateral lower extremity edema noted, 1+ bilaterally.  We will see about getting pelvic x-ray done at clinic when possible.  Reasonable to get follow-up labs done given her episodic shortness of breath and swelling.  Fortunately her lungs are clear.  We will see about getting flu shot arranged when she comes for clinic visit.  Discussed pandemic considerations.  Her daughter can help get her to the clinic.  Follow Up Instructions: see above.   I discussed the assessment and treatment plan with the patient. The patient was provided an opportunity to ask  questions and all were answered. The patient agreed with the plan and demonstrated an understanding of the instructions.   The patient was advised to call back or seek an in-person evaluation if the symptoms worsen or if the condition fails to improve as anticipated.  I provided 15 minutes of non-face-to-face time during this encounter.  Elsie Stain, MD

## 2019-03-25 DIAGNOSIS — R102 Pelvic and perineal pain: Secondary | ICD-10-CM | POA: Insufficient documentation

## 2019-03-25 NOTE — Assessment & Plan Note (Signed)
She has had some episodic shortness of breath that is longstanding.  She has a history of supplemental oxygen dependence and previous MI.  Pelvic pain, likely not hip pain.  Discussed about using tramadol with routine cautions in the meantime.  This is likely exacerbated by prolonged sitting/relative deconditioning.  We talked about getting a home visit done.  I went to her house on 03/25/2019.  I followed usual infection control measures with gloves mask and goggles.  We talked about her situation.  Her heart sounded regular, her lungs are clear.  She did not have midline back pain but she had bilateral left greater than right lower back pain near the SI joints.  She did not have pain on hip range of motion.  Greater trochanteric bursa was not tender bilaterally.  She did have bilateral lower extremity edema noted, 1+ bilaterally.  We will see about getting pelvic x-ray done at clinic when possible.  Reasonable to get follow-up labs done given her episodic shortness of breath and swelling.  Fortunately her lungs are clear.  We will see about getting flu shot arranged when she comes for clinic visit.  Discussed pandemic considerations.  Her daughter can help get her to the clinic.

## 2019-03-26 ENCOUNTER — Telehealth: Payer: Self-pay | Admitting: Radiology

## 2019-03-26 NOTE — Telephone Encounter (Signed)
Dr Damita Dunnings wants pt to schedule labs, x ray, flu shot. LVM for daughter, [name] and on home phone

## 2019-03-27 ENCOUNTER — Ambulatory Visit (INDEPENDENT_AMBULATORY_CARE_PROVIDER_SITE_OTHER): Payer: Medicare Other

## 2019-03-27 ENCOUNTER — Ambulatory Visit (INDEPENDENT_AMBULATORY_CARE_PROVIDER_SITE_OTHER)
Admission: RE | Admit: 2019-03-27 | Discharge: 2019-03-27 | Disposition: A | Discharge status: Home / Self Care | Payer: Medicare Other | Source: Ambulatory Visit | Attending: Family Medicine | Admitting: Family Medicine

## 2019-03-27 ENCOUNTER — Other Ambulatory Visit (INDEPENDENT_AMBULATORY_CARE_PROVIDER_SITE_OTHER): Payer: Medicare Other

## 2019-03-27 ENCOUNTER — Other Ambulatory Visit: Payer: Self-pay | Admitting: Family Medicine

## 2019-03-27 DIAGNOSIS — R0602 Shortness of breath: Secondary | ICD-10-CM

## 2019-03-27 DIAGNOSIS — Z23 Encounter for immunization: Secondary | ICD-10-CM

## 2019-03-27 DIAGNOSIS — R102 Pelvic and perineal pain: Secondary | ICD-10-CM

## 2019-03-27 DIAGNOSIS — R609 Edema, unspecified: Secondary | ICD-10-CM

## 2019-03-27 LAB — CBC WITH DIFFERENTIAL/PLATELET
Basophils Absolute: 0.1 10*3/uL (ref 0.0–0.1)
Basophils Relative: 1.3 % (ref 0.0–3.0)
Eosinophils Absolute: 0.3 10*3/uL (ref 0.0–0.7)
Eosinophils Relative: 8.2 % — ABNORMAL HIGH (ref 0.0–5.0)
HCT: 28.1 % — ABNORMAL LOW (ref 36.0–46.0)
Hemoglobin: 9.3 g/dL — ABNORMAL LOW (ref 12.0–15.0)
Lymphocytes Relative: 22.7 % (ref 12.0–46.0)
Lymphs Abs: 0.9 10*3/uL (ref 0.7–4.0)
MCHC: 33.1 g/dL (ref 30.0–36.0)
MCV: 100.7 fl — ABNORMAL HIGH (ref 78.0–100.0)
Monocytes Absolute: 0.3 10*3/uL (ref 0.1–1.0)
Monocytes Relative: 7.6 % (ref 3.0–12.0)
Neutro Abs: 2.5 10*3/uL (ref 1.4–7.7)
Neutrophils Relative %: 60.2 % (ref 43.0–77.0)
Platelets: 215 10*3/uL (ref 150.0–400.0)
RBC: 2.79 Mil/uL — ABNORMAL LOW (ref 3.87–5.11)
RDW: 14.6 % (ref 11.5–15.5)
WBC: 4.1 10*3/uL (ref 4.0–10.5)

## 2019-03-27 LAB — BASIC METABOLIC PANEL
BUN: 13 mg/dL (ref 6–23)
CO2: 36 mEq/L — ABNORMAL HIGH (ref 19–32)
Calcium: 9.3 mg/dL (ref 8.4–10.5)
Chloride: 103 mEq/L (ref 96–112)
Creatinine, Ser: 0.69 mg/dL (ref 0.40–1.20)
GFR: 80.65 mL/min (ref >60.00)
Glucose, Bld: 100 mg/dL — ABNORMAL HIGH (ref 70–99)
Potassium: 4 mEq/L (ref 3.5–5.1)
Sodium: 143 mEq/L (ref 135–145)

## 2019-03-27 LAB — BRAIN NATRIURETIC PEPTIDE: Pro B Natriuretic peptide (BNP): 461 pg/mL — ABNORMAL HIGH (ref 0.0–100.0)

## 2019-03-29 ENCOUNTER — Other Ambulatory Visit: Payer: Self-pay | Admitting: Family Medicine

## 2019-03-29 DIAGNOSIS — D649 Anemia, unspecified: Secondary | ICD-10-CM

## 2019-03-31 ENCOUNTER — Encounter: Payer: Self-pay | Admitting: Family Medicine

## 2019-04-04 ENCOUNTER — Telehealth: Payer: Self-pay | Admitting: Family Medicine

## 2019-04-04 NOTE — Telephone Encounter (Signed)
Please check with patient to make sure she is getting her IFOB done.  I wanted to make sure I got the results on this.  Thanks.

## 2019-04-04 NOTE — Telephone Encounter (Signed)
When can this patient come back to your clinic for follow up on her anemia?  Please let me know.  I appreciate your help.

## 2019-04-06 ENCOUNTER — Other Ambulatory Visit: Payer: Self-pay | Admitting: Oncology

## 2019-04-06 DIAGNOSIS — C50411 Malignant neoplasm of upper-outer quadrant of right female breast: Secondary | ICD-10-CM

## 2019-04-06 DIAGNOSIS — Z170 Estrogen receptor positive status [ER+]: Secondary | ICD-10-CM

## 2019-04-06 DIAGNOSIS — D508 Other iron deficiency anemias: Secondary | ICD-10-CM

## 2019-04-06 NOTE — Telephone Encounter (Signed)
Left detailed message on voicemail. DPR 

## 2019-04-06 NOTE — Telephone Encounter (Signed)
Patient returned [name]'s call and said she didn't receive the IFOB.

## 2019-04-06 NOTE — Telephone Encounter (Signed)
Patient called back and said she just received the IFOB in the mail.

## 2019-04-07 ENCOUNTER — Telehealth: Payer: Self-pay | Admitting: Oncology

## 2019-04-07 NOTE — Telephone Encounter (Signed)
Scheduled appt per 9/28 sch message - pt is aware of appt date and time

## 2019-04-09 ENCOUNTER — Other Ambulatory Visit: Payer: Self-pay | Admitting: Family Medicine

## 2019-04-14 ENCOUNTER — Other Ambulatory Visit (INDEPENDENT_AMBULATORY_CARE_PROVIDER_SITE_OTHER): Payer: Medicare Other

## 2019-04-14 ENCOUNTER — Telehealth: Payer: Self-pay | Admitting: Radiology

## 2019-04-14 DIAGNOSIS — D649 Anemia, unspecified: Secondary | ICD-10-CM

## 2019-04-14 DIAGNOSIS — R195 Other fecal abnormalities: Secondary | ICD-10-CM

## 2019-04-14 LAB — FECAL OCCULT BLOOD, IMMUNOCHEMICAL: Fecal Occult Bld: POSITIVE — AB

## 2019-04-14 NOTE — Telephone Encounter (Signed)
Please notify pt that IFOB is positive with blood in her stool.  I will update the hematology clinic- Dr. Jana Hakim.  Is patient willing to see the GI clinic? Please let me know.  Thanks.

## 2019-04-14 NOTE — Telephone Encounter (Signed)
[facility] called a critical, POSITIVE IFOB, results given to Dr Damita Dunnings

## 2019-04-15 ENCOUNTER — Other Ambulatory Visit: Payer: Self-pay | Admitting: Oncology

## 2019-04-15 NOTE — Telephone Encounter (Signed)
Noted.  Thanks.  And I will await input from hematology too.

## 2019-04-15 NOTE — Telephone Encounter (Signed)
Patient advised. Patient will think about this and will call us back with her decision.

## 2019-04-20 NOTE — Telephone Encounter (Signed)
Please check with patient.  Is she willing to go see GI?  Did she decide about this?  I think it makes sense to at least talk with GI.  They may be able to do a virtual appointment.  Thanks.

## 2019-04-20 NOTE — Telephone Encounter (Signed)
LM asking patient to call back

## 2019-04-20 NOTE — Telephone Encounter (Signed)
Patient returned your call.  She can be reached at [phone].

## 2019-04-21 NOTE — Addendum Note (Signed)
Addended by: Tonia Ghent on: 04/21/2019 06:54 AM   Modules accepted: Orders

## 2019-04-21 NOTE — Progress Notes (Signed)
Virtual Visit via Video Note   This visit type was conducted due to national recommendations for restrictions regarding the COVID-19 Pandemic (e.g. social distancing) in an effort to limit this patient's exposure and mitigate transmission in our community.  Due to her co-morbid illnesses, this patient is at least at moderate risk for complications without adequate follow up.  This format is felt to be most appropriate for this patient at this time.  All issues noted in this document were discussed and addressed.  A limited physical exam was performed with this format.  Please refer to the patient's chart for her consent to telehealth for [facility].   Date:  04/22/2019   ID:  [name], DOB [birth year], MRN [id number]  Patient Location: Home Provider Location: Office  PCP:  Tonia Ghent, MD  Cardiologist:  Sinclair Grooms, MD  Electrophysiologist:  None   Evaluation Performed:  Follow-Up Visit  Chief Complaint:  CAD  History of Present Illness:    [name] is a 85 y.o. female with CAD with 50-60& LM managed medically dure to co-morbidities, chronic systolic heart failure, status post acute STEMI 01/29/19 with DES RCA, breast cancer treated with anastrozole and lumpectomy, O2 requiring COPD, hyperlipidemia, and essential hypertension.  [name] is doing relatively well.  No angina.  She informs me that Dr. Elsie Stain has found blood in her stool.  She is not seen blood.  She had inferior STEMI in July.  She was treated with DES and RCA.  She has had no recurrence of pain similar to that.  She denies use of nitroglycerin.    The patient does not have symptoms concerning for COVID-19 infection (fever, chills, cough, or new shortness of breath).    Past Medical History:  Diagnosis Date  . Allergic rhinitis, cause unspecified   . Allergy, unspecified not elsewhere classified   . Anxiety   . Asthma   . Chronic kidney disease    frequency  . COPD  (chronic obstructive pulmonary disease) ([place])   . Degeneration of intervertebral disc, site unspecified   . Diverticulosis of colon (without mention of hemorrhage)   . Esophageal reflux   . Headache   . Insomnia, unspecified   . Myalgia and myositis, unspecified   . Need for prophylactic hormone replacement therapy (postmenopausal)   . Osteoarthritis of right knee 08/27/2011   R knee pain, s/p arthroscopy 2012 per [name]   . Osteoporosis, unspecified   . Other abnormal blood chemistry   . Other and unspecified hyperlipidemia   . Other chronic pain   . Stroke [facility])    mini strokes  . Unspecified essential hypertension   . Unspecified hypothyroidism    Past Surgical History:  Procedure Laterality Date  . BACK SURGERY    . breast cystectomy    . CORONARY/GRAFT ACUTE MI REVASCULARIZATION N/A 01/29/2019   Procedure: CORONARY/GRAFT ACUTE MI REVASCULARIZATION;  Surgeon: Belva Crome, MD;  Location: [place] [facility];  Service: Cardiovascular;  Laterality: N/A;  . JOINT REPLACEMENT     shoulder right  . KNEE ARTHROSCOPY     Right knee 2012  . MASS EXCISION Left 04/07/2015   Procedure: MINOR EXCISION OF MASS LEFT SMALL FINGER;  Surgeon: Leanora Cover, MD;  Location: [place];  Service: Orthopedics;  Laterality: Left;  . SHOULDER SURGERY    . TOTAL KNEE ARTHROPLASTY  12/11/2011   Procedure: TOTAL KNEE ARTHROPLASTY;  Surgeon: Johnny Bridge, MD;  Location: [place];  Service: Orthopedics;  Laterality: Right;  . UMBILICAL HERNIA REPAIR N/A 06/09/2013   Procedure: EXPLORATIORY LAPAROTOMY, HERNIA REPAIR UMBILICAL, INSERTION OF MESH;  Surgeon: Haywood Lasso, MD;  Location: [place];  Service: General;  Laterality: N/A;     Current Meds  Medication Sig  . alendronate (FOSAMAX) 70 MG tablet Take 1 tablet (70 mg total) by mouth once a week. Take with a full glass of water on an empty stomach.  . ALPRAZolam (XANAX) 1 MG tablet TAKE 1/2 TABLET BY MOUTH 2 TIMES DAILY AS NEEDED  FOR ANXIETY.  Marland Kitchen anastrozole (ARIMIDEX) 1 MG tablet Take 1 tablet (1 mg total) by mouth daily.  Marland Kitchen aspirin 81 MG chewable tablet Chew 1 tablet (81 mg total) by mouth daily.  Marland Kitchen atorvastatin (LIPITOR) 80 MG tablet Take 1 tablet (80 mg total) by mouth daily at 6 PM.  . bisoprolol (ZEBETA) 5 MG tablet Take 0.5 tablets (2.5 mg total) by mouth daily.  . Calcium Carbonate (CALTRATE 600 PO) Take 600 mg by mouth daily.   . cholecalciferol (VITAMIN D) 1000 units tablet Take 1 tablet (1,000 Units total) by mouth daily.  . diclofenac sodium (VOLTAREN) 1 % GEL APPLY 4 GRAMS TO AFFECTED AREA 2 TIMES A DAY.  . DULoxetine (CYMBALTA) 60 MG capsule TAKE 1 CAPSULE BY MOUTH DAILY  . ferrous sulfate (FERROUSUL) 325 (65 FE) MG tablet Take 1 tablet (325 mg total) by mouth daily with breakfast.  . gabapentin (NEURONTIN) 300 MG capsule 2 tabs in the AM and 1 tab in the afternoon  . hydroxypropyl methylcellulose / hypromellose (ISOPTO TEARS / GONIOVISC) 2.5 % ophthalmic solution Place 1 drop into both eyes 3 (three) times daily as needed for dry eyes.  Marland Kitchen levothyroxine (SYNTHROID) 112 MCG tablet TAKE 1 TABLET BY MOUTH DAILY BEFORE BREAKFAST.  Marland Kitchen losartan (COZAAR) 25 MG tablet Take 0.5 tablets (12.5 mg total) by mouth daily.  . Multiple Vitamin (MULTIVITAMIN WITH MINERALS) TABS tablet Take 1 tablet by mouth daily.  . Multiple Vitamins-Minerals (OCUVITE PRESERVISION PO) Take 1 tablet by mouth daily.  . nitroGLYCERIN (NITROSTAT) 0.4 MG SL tablet Place 1 tablet (0.4 mg total) under the tongue every 5 (five) minutes as needed for chest pain.  Marland Kitchen omeprazole (PRILOSEC) 20 MG capsule Take 1 capsule (20 mg total) by mouth 2 (two) times daily before a meal.  . OXYGEN Inhale 3-3.5 L into the lungs continuous.  Marland Kitchen PROAIR HFA 108 (90 Base) MCG/ACT inhaler INHALE 1 TO 2 PUFFS INTO THE LUNGS EVERY 6 HOURS AS NEEDED FOR WHEEZING OR SHORTNESS OF BREATH.  . ticagrelor (BRILINTA) 90 MG TABS tablet Take 1 tablet (90 mg total) by mouth 2 (two)  times daily.  Marland Kitchen tiotropium (SPIRIVA HANDIHALER) 18 MCG inhalation capsule PLACE 1 CAPSULE INTO INHALER AND INHALE ONCE A DAY  . traMADol (ULTRAM) 50 MG tablet Take 1 tablet (50 mg total) by mouth every 12 (twelve) hours as needed (for pain).  . traZODone (DESYREL) 50 MG tablet Take 3 tablets (150 mg total) by mouth at bedtime as needed for sleep.     Allergies:   Doxycycline, Sulfadiazine, and Sulfa antibiotics   Social History   Tobacco Use  . Smoking status: Former Smoker    Packs/day: 1.50    Years: 10.00    Pack years: 15.00    Types: Cigarettes    Quit date: 07/09/1985    Years since quitting: 33.8  . Smokeless tobacco: Never Used  . Tobacco comment: 1 1/2 ppd x 20 years  Substance Use Topics  . Alcohol use: No    Alcohol/week: 0.0 standard drinks  . Drug use: No     Family Hx: The patient's family history includes Asthma in her sister; Heart disease in her father and mother; Hypertension in her father; Lung cancer in her brother.  ROS:   Please see the history of present illness.    She feels that there are multiple chronic medical problems that cause her to feel bad.  COPD with chronic O2 requirement.  She has difficulty sleeping.  Her appetite is doing okay.  As mentioned before she has a stool that is positive for blood. All other systems reviewed and are negative.   Prior CV studies:   The following studies were reviewed today:  Post PCI 01/29/2019: Diagnostic Dominance: Right  Intervention      Labs/Other Tests and Data Reviewed:    EKG:  No ECG reviewed.  Recent Labs: 03/19/2019: ALT 17 03/27/2019: BUN 13; Creatinine, Ser 0.69; Hemoglobin 9.3; Platelets 215.0; Potassium 4.0; Pro B Natriuretic peptide (BNP) 461.0; Sodium 143   Recent Lipid Panel Lab Results  Component Value Date/Time   CHOL 124 03/19/2019 11:05 AM   TRIG 72 03/19/2019 11:05 AM   HDL 47 03/19/2019 11:05 AM   CHOLHDL 2.6 03/19/2019 11:05 AM   CHOLHDL 4.7 01/29/2019 12:52 PM    LDLCALC 62 03/19/2019 11:05 AM   LDLDIRECT 157.9 03/12/2007 11:03 AM    Wt Readings from Last 3 Encounters:  02/16/19 140 lb (63.5 kg)  02/05/19 145 lb (65.8 kg)  02/01/19 141 lb 4.8 oz (64.1 kg)     Objective:    Vital Signs:  BP (!) 162/81   Pulse 80   Ht 5' (1.524 m)   BMI 27.34 kg/m    VITAL SIGNS:  reviewed GEN:  no acute distress EYES:  sclerae anicteric, EOMI - Extraocular Movements Intact RESPIRATORY:  normal respiratory effort, symmetric expansion CARDIOVASCULAR:  no peripheral edema  ASSESSMENT & PLAN:    1. Hyperlipidemia, unspecified hyperlipidemia type   2. Coronary artery disease involving native coronary artery of native heart without angina pectoris   3. Essential hypertension   4. Thoracic aortic aneurysm without rupture ([place])   5. Left main coronary artery disease - ~50-60% dLM (&70% dLAD) -- Plan Med Rx   6. Ischemic cardiomyopathy   7. Gold C Copd with asthmatic bronchitis component and chronic resp failure   8. Educated about COVID-19 virus infection    PLAN:  1. The patient continues on high intensity statin therapy.  LDL when evaluated in September was 62 on high intensity statin therapy.   2. Secondary prevention discussed. 3. Target blood pressure 130/80 mmHg. 4. Not discussed 5. She knows there is residual coronary disease that we will treat with medication. 6. No heart failure symptoms. 7. Chronic O2 therapy. 8. She is adhering to 3 W's  Overall education and awareness concerning primary/secondary risk prevention was discussed in detail: LDL less than 70, hemoglobin A1c less than 7, blood pressure target less than 130/80 mmHg, >150 minutes of moderate aerobic activity per week, avoidance of smoking, weight control (via diet and exercise), and continued surveillance/management of/for obstructive sleep apnea.     COVID-19 Education: The signs and symptoms of COVID-19 were discussed with the patient and how to seek care for testing (follow  up with PCP or arrange E-visit).  The importance of social distancing was discussed today.  Time:   Today, I have spent 15 minutes with the patient  with telehealth technology discussing the above problems.     Medication Adjustments/Labs and Tests Ordered: Current medicines are reviewed at length with the patient today.  Concerns regarding medicines are outlined above.   Tests Ordered: No orders of the defined types were placed in this encounter.   Medication Changes: No orders of the defined types were placed in this encounter.   Follow Up:  In Person in 6 month(s)  Signed, Sinclair Grooms, MD  04/22/2019 11:03 AM    [place]

## 2019-04-21 NOTE — Telephone Encounter (Signed)
[name], CMA  You 20 hours ago (10:11 AM)   I spoke with patient in length & she said that she is willing to be referred to GI. She does not know though if she will end up going. She said that she already had cancer & 85 years old doesn't want to spend the rest of her time back n' forth to doctors offices more than she already is. She stated that if she has cancer that's spread she doesn't want treatment. She stated that she doesn't get around well & has to depend on family to take her places. She prefers not go, but you can place the referral & she will think about consult with GI.      ================================ Referral placed.  Thanks.

## 2019-04-22 ENCOUNTER — Other Ambulatory Visit: Payer: Self-pay | Admitting: Family Medicine

## 2019-04-22 ENCOUNTER — Telehealth (INDEPENDENT_AMBULATORY_CARE_PROVIDER_SITE_OTHER): Payer: Medicare Other | Admitting: Interventional Cardiology

## 2019-04-22 ENCOUNTER — Encounter: Payer: Self-pay | Admitting: Interventional Cardiology

## 2019-04-22 ENCOUNTER — Other Ambulatory Visit: Payer: Self-pay

## 2019-04-22 ENCOUNTER — Telehealth: Payer: Self-pay | Admitting: Interventional Cardiology

## 2019-04-22 VITALS — BP 162/81 | HR 80 | Ht 60.0 in

## 2019-04-22 DIAGNOSIS — Z7189 Other specified counseling: Secondary | ICD-10-CM

## 2019-04-22 DIAGNOSIS — J449 Chronic obstructive pulmonary disease, unspecified: Secondary | ICD-10-CM

## 2019-04-22 DIAGNOSIS — I10 Essential (primary) hypertension: Secondary | ICD-10-CM | POA: Diagnosis not present

## 2019-04-22 DIAGNOSIS — I712 Thoracic aortic aneurysm, without rupture, unspecified: Secondary | ICD-10-CM

## 2019-04-22 DIAGNOSIS — E785 Hyperlipidemia, unspecified: Secondary | ICD-10-CM | POA: Diagnosis not present

## 2019-04-22 DIAGNOSIS — I2510 Atherosclerotic heart disease of native coronary artery without angina pectoris: Secondary | ICD-10-CM

## 2019-04-22 DIAGNOSIS — I255 Ischemic cardiomyopathy: Secondary | ICD-10-CM

## 2019-04-22 NOTE — Telephone Encounter (Signed)
Spoke with pt and daughter and changed appt to virtual.  Consent given.     Virtual Visit Pre-Appointment Phone Call  "(Name), I am calling you today to discuss your upcoming appointment. We are currently trying to limit exposure to the virus that causes COVID-19 by seeing patients at home rather than in the office."  1. "What is the BEST phone number to call the day of the visit?" - include this in appointment notes  2. "Do you have or have access to (through a family member/friend) a smartphone with video capability that we can use for your visit?" a. If yes - list this number in appt notes as "cell" (if different from BEST phone #) and list the appointment type as a VIDEO visit in appointment notes b. If no - list the appointment type as a PHONE visit in appointment notes  3. Confirm consent - "In the setting of the current Covid19 crisis, you are scheduled for a (phone or video) visit with your provider on (date) at (time).  Just as we do with many in-office visits, in order for you to participate in this visit, we must obtain consent.  If you'd like, I can send this to your mychart (if signed up) or email for you to review.  Otherwise, I can obtain your verbal consent now.  All virtual visits are billed to your insurance company just like a normal visit would be.  By agreeing to a virtual visit, we'd like you to understand that the technology does not allow for your provider to perform an examination, and thus may limit your provider's ability to fully assess your condition. If your provider identifies any concerns that need to be evaluated in person, we will make arrangements to do so.  Finally, though the technology is pretty good, we cannot assure that it will always work on either your or our end, and in the setting of a video visit, we may have to convert it to a phone-only visit.  In either situation, we cannot ensure that we have a secure connection.  Are you willing to proceed?" STAFF:  Did the patient verbally acknowledge consent to telehealth visit? Document YES/NO here: YES  4. Advise patient to be prepared - "Two hours prior to your appointment, go ahead and check your blood pressure, pulse, oxygen saturation, and your weight (if you have the equipment to check those) and write them all down. When your visit starts, your provider will ask you for this information. If you have an Apple Watch or Kardia device, please plan to have heart rate information ready on the day of your appointment. Please have a pen and paper handy nearby the day of the visit as well."  5. Give patient instructions for MyChart download to smartphone OR Doximity/Doxy.me as below if video visit (depending on what platform provider is using)  6. Inform patient they will receive a phone call 15 minutes prior to their appointment time (may be from unknown caller ID) so they should be prepared to answer    TELEPHONE CALL NOTE  [name] has been deemed a candidate for a follow-up tele-health visit to limit community exposure during the Covid-19 pandemic. I spoke with the patient via phone to ensure availability of phone/video source, confirm preferred email & phone number, and discuss instructions and expectations.  I reminded [name] to be prepared with any vital sign and/or heart rhythm information that could potentially be obtained via home monitoring, at the time  of her visit. I reminded [name] to expect a phone call prior to her visit.  Loren Racer, RN 04/22/2019 10:05 AM   INSTRUCTIONS FOR DOWNLOADING THE MYCHART APP TO SMARTPHONE  - The patient must first make sure to have activated MyChart and know their login information - If Apple, go to [employer] and type in MyChart in the search bar and download the app. If Android, ask patient to go to [employer] and type in [place] in the search bar and download the app. The app is free but as with any  other app downloads, their phone may require them to verify saved payment information or Apple/Android password.  - The patient will need to then log into the app with their MyChart username and password, and select [facility] as their healthcare provider to link the account. When it is time for your visit, go to the MyChart app, find appointments, and click Begin Video Visit. Be sure to Select Allow for your device to access the Microphone and Camera for your visit. You will then be connected, and your provider will be with you shortly.  **If they have any issues connecting, or need assistance please contact MyChart service desk [phone])**  **If using a computer, in order to ensure the best quality for their visit they will need to use either of the following Internet Browsers: [employer], or Google Chrome**  IF USING DOXIMITY or DOXY.ME - The patient will receive a link just prior to their visit by text.     FULL LENGTH CONSENT FOR TELE-HEALTH VISIT   I hereby voluntarily request, consent and authorize [facility] and its employed or contracted physicians, physician assistants, nurse practitioners or other licensed health care professionals (the Practitioner), to provide me with telemedicine health care services (the "Services") as deemed necessary by the treating Practitioner. I acknowledge and consent to receive the Services by the Practitioner via telemedicine. I understand that the telemedicine visit will involve communicating with the Practitioner through live audiovisual communication technology and the disclosure of certain medical information by electronic transmission. I acknowledge that I have been given the opportunity to request an in-person assessment or other available alternative prior to the telemedicine visit and am voluntarily participating in the telemedicine visit.  I understand that I have the right to withhold or withdraw my consent to the use of  telemedicine in the course of my care at any time, without affecting my right to future care or treatment, and that the Practitioner or I may terminate the telemedicine visit at any time. I understand that I have the right to inspect all information obtained and/or recorded in the course of the telemedicine visit and may receive copies of available information for a reasonable fee.  I understand that some of the potential risks of receiving the Services via telemedicine include:  Marland Kitchen Delay or interruption in medical evaluation due to technological equipment failure or disruption; . Information transmitted may not be sufficient (e.g. poor resolution of images) to allow for appropriate medical decision making by the Practitioner; and/or  . In rare instances, security protocols could fail, causing a breach of personal health information.  Furthermore, I acknowledge that it is my responsibility to provide information about my medical history, conditions and care that is complete and accurate to the best of my ability. I acknowledge that Practitioner's advice, recommendations, and/or decision may be based on factors not within their control, such as incomplete or inaccurate data provided  by me or distortions of diagnostic images or specimens that may result from electronic transmissions. I understand that the practice of medicine is not an exact science and that Practitioner makes no warranties or guarantees regarding treatment outcomes. I acknowledge that I will receive a copy of this consent concurrently upon execution via email to the email address I last provided but may also request a printed copy by calling the office of [facility].    I understand that my insurance will be billed for this visit.   I have read or had this consent read to me. . I understand the contents of this consent, which adequately explains the benefits and risks of the Services being provided via telemedicine.  . I have been  provided ample opportunity to ask questions regarding this consent and the Services and have had my questions answered to my satisfaction. . I give my informed consent for the services to be provided through the use of telemedicine in my medical care  By participating in this telemedicine visit I agree to the above.

## 2019-04-22 NOTE — Telephone Encounter (Signed)
New message      Pt is unable to come to office for appt today.  Daughter want to know if she can have a virtual visit instead.  Please advise

## 2019-04-22 NOTE — Patient Instructions (Signed)
Medication Instructions:  Your physician recommends that you continue on your current medications as directed. Please refer to the Current Medication list given to you today.  If you need a refill on your cardiac medications before your next appointment, please call your pharmacy.   Lab work: None If you have labs (blood work) drawn today and your tests are completely normal, you will receive your results only by: . MyChart Message (if you have MyChart) OR . A paper copy in the mail If you have any lab test that is abnormal or we need to change your treatment, we will call you to review the results.  Testing/Procedures: None  Follow-Up: At [facility], you and your health needs are our priority.  As part of our continuing mission to provide you with exceptional heart care, we have created designated Provider Care Teams.  These Care Teams include your primary Cardiologist (physician) and Advanced Practice Providers (APPs -  Physician Assistants and Nurse Practitioners) who all work together to provide you with the care you need, when you need it. You will need a follow up appointment in 9 months.  Please call our office 2 months in advance to schedule this appointment.  You may see Henry W Smith III, MD or one of the following Advanced Practice Providers on your designated Care Team:   Lori Gerhardt, NP Laura Ingold, NP . Jill McDaniel, NP  Any Other Special Instructions Will Be Listed Below (If Applicable).    

## 2019-04-24 ENCOUNTER — Inpatient Hospital Stay: Payer: Medicare Other | Attending: Oncology

## 2019-04-24 DIAGNOSIS — Z79899 Other long term (current) drug therapy: Secondary | ICD-10-CM | POA: Insufficient documentation

## 2019-04-24 DIAGNOSIS — C773 Secondary and unspecified malignant neoplasm of axilla and upper limb lymph nodes: Secondary | ICD-10-CM | POA: Insufficient documentation

## 2019-04-24 DIAGNOSIS — E039 Hypothyroidism, unspecified: Secondary | ICD-10-CM | POA: Insufficient documentation

## 2019-04-24 DIAGNOSIS — Z7982 Long term (current) use of aspirin: Secondary | ICD-10-CM | POA: Insufficient documentation

## 2019-04-24 DIAGNOSIS — E785 Hyperlipidemia, unspecified: Secondary | ICD-10-CM | POA: Insufficient documentation

## 2019-04-24 DIAGNOSIS — K219 Gastro-esophageal reflux disease without esophagitis: Secondary | ICD-10-CM | POA: Insufficient documentation

## 2019-04-24 DIAGNOSIS — Z8249 Family history of ischemic heart disease and other diseases of the circulatory system: Secondary | ICD-10-CM | POA: Insufficient documentation

## 2019-04-24 DIAGNOSIS — Z87891 Personal history of nicotine dependence: Secondary | ICD-10-CM | POA: Insufficient documentation

## 2019-04-24 DIAGNOSIS — Z79811 Long term (current) use of aromatase inhibitors: Secondary | ICD-10-CM | POA: Insufficient documentation

## 2019-04-24 DIAGNOSIS — F419 Anxiety disorder, unspecified: Secondary | ICD-10-CM | POA: Insufficient documentation

## 2019-04-24 DIAGNOSIS — Z803 Family history of malignant neoplasm of breast: Secondary | ICD-10-CM | POA: Insufficient documentation

## 2019-04-24 DIAGNOSIS — Z170 Estrogen receptor positive status [ER+]: Secondary | ICD-10-CM | POA: Insufficient documentation

## 2019-04-24 DIAGNOSIS — Z8673 Personal history of transient ischemic attack (TIA), and cerebral infarction without residual deficits: Secondary | ICD-10-CM | POA: Insufficient documentation

## 2019-04-24 DIAGNOSIS — D649 Anemia, unspecified: Secondary | ICD-10-CM | POA: Insufficient documentation

## 2019-04-24 DIAGNOSIS — Z801 Family history of malignant neoplasm of trachea, bronchus and lung: Secondary | ICD-10-CM | POA: Insufficient documentation

## 2019-04-24 DIAGNOSIS — C50411 Malignant neoplasm of upper-outer quadrant of right female breast: Secondary | ICD-10-CM | POA: Insufficient documentation

## 2019-04-24 DIAGNOSIS — Z791 Long term (current) use of non-steroidal anti-inflammatories (NSAID): Secondary | ICD-10-CM | POA: Insufficient documentation

## 2019-04-24 DIAGNOSIS — R195 Other fecal abnormalities: Secondary | ICD-10-CM | POA: Insufficient documentation

## 2019-04-24 DIAGNOSIS — M810 Age-related osteoporosis without current pathological fracture: Secondary | ICD-10-CM | POA: Insufficient documentation

## 2019-04-24 NOTE — Telephone Encounter (Signed)
Sent. Thanks.   

## 2019-04-28 ENCOUNTER — Inpatient Hospital Stay: Payer: Medicare Other

## 2019-04-28 ENCOUNTER — Other Ambulatory Visit: Payer: Self-pay

## 2019-04-28 ENCOUNTER — Inpatient Hospital Stay: Payer: Medicare Other | Admitting: Adult Health

## 2019-04-28 VITALS — BP 151/75 | HR 72 | Temp 98.7°F | Resp 18 | Ht 60.0 in | Wt 144.5 lb

## 2019-04-28 DIAGNOSIS — Z8249 Family history of ischemic heart disease and other diseases of the circulatory system: Secondary | ICD-10-CM | POA: Diagnosis not present

## 2019-04-28 DIAGNOSIS — Z803 Family history of malignant neoplasm of breast: Secondary | ICD-10-CM | POA: Diagnosis not present

## 2019-04-28 DIAGNOSIS — Z8673 Personal history of transient ischemic attack (TIA), and cerebral infarction without residual deficits: Secondary | ICD-10-CM | POA: Diagnosis not present

## 2019-04-28 DIAGNOSIS — Z791 Long term (current) use of non-steroidal anti-inflammatories (NSAID): Secondary | ICD-10-CM | POA: Diagnosis not present

## 2019-04-28 DIAGNOSIS — Z87891 Personal history of nicotine dependence: Secondary | ICD-10-CM | POA: Diagnosis not present

## 2019-04-28 DIAGNOSIS — Z170 Estrogen receptor positive status [ER+]: Secondary | ICD-10-CM

## 2019-04-28 DIAGNOSIS — C50411 Malignant neoplasm of upper-outer quadrant of right female breast: Secondary | ICD-10-CM

## 2019-04-28 DIAGNOSIS — K219 Gastro-esophageal reflux disease without esophagitis: Secondary | ICD-10-CM | POA: Diagnosis not present

## 2019-04-28 DIAGNOSIS — Z79811 Long term (current) use of aromatase inhibitors: Secondary | ICD-10-CM | POA: Diagnosis not present

## 2019-04-28 DIAGNOSIS — R195 Other fecal abnormalities: Secondary | ICD-10-CM | POA: Diagnosis not present

## 2019-04-28 DIAGNOSIS — M810 Age-related osteoporosis without current pathological fracture: Secondary | ICD-10-CM | POA: Diagnosis not present

## 2019-04-28 DIAGNOSIS — F419 Anxiety disorder, unspecified: Secondary | ICD-10-CM | POA: Diagnosis not present

## 2019-04-28 DIAGNOSIS — Z79899 Other long term (current) drug therapy: Secondary | ICD-10-CM | POA: Diagnosis not present

## 2019-04-28 DIAGNOSIS — C773 Secondary and unspecified malignant neoplasm of axilla and upper limb lymph nodes: Secondary | ICD-10-CM | POA: Diagnosis not present

## 2019-04-28 DIAGNOSIS — Z7982 Long term (current) use of aspirin: Secondary | ICD-10-CM | POA: Diagnosis not present

## 2019-04-28 DIAGNOSIS — D508 Other iron deficiency anemias: Secondary | ICD-10-CM

## 2019-04-28 DIAGNOSIS — E039 Hypothyroidism, unspecified: Secondary | ICD-10-CM | POA: Diagnosis not present

## 2019-04-28 DIAGNOSIS — E785 Hyperlipidemia, unspecified: Secondary | ICD-10-CM | POA: Diagnosis not present

## 2019-04-28 DIAGNOSIS — Z801 Family history of malignant neoplasm of trachea, bronchus and lung: Secondary | ICD-10-CM | POA: Diagnosis not present

## 2019-04-28 DIAGNOSIS — D649 Anemia, unspecified: Secondary | ICD-10-CM | POA: Diagnosis not present

## 2019-04-28 DIAGNOSIS — N63 Unspecified lump in unspecified breast: Secondary | ICD-10-CM

## 2019-04-28 LAB — CMP (CANCER CENTER ONLY)
ALT: 9 U/L (ref 0–44)
AST: 29 U/L (ref 15–41)
Albumin: 4 g/dL (ref 3.5–5.0)
Alkaline Phosphatase: 48 U/L (ref 38–126)
Anion gap: 8 (ref 5–15)
BUN: 12 mg/dL (ref 8–23)
CO2: 33 mmol/L — ABNORMAL HIGH (ref 22–32)
Calcium: 9.3 mg/dL (ref 8.9–10.3)
Chloride: 101 mmol/L (ref 98–111)
Creatinine: 0.72 mg/dL (ref 0.44–1.00)
GFR, Est AFR Am: >60 mL/min (ref >60)
GFR, Estimated: >60 mL/min (ref >60)
Glucose, Bld: 92 mg/dL (ref 70–99)
Potassium: 3.8 mmol/L (ref 3.5–5.1)
Sodium: 142 mmol/L (ref 135–145)
Total Bilirubin: 0.5 mg/dL (ref 0.3–1.2)
Total Protein: 7.6 g/dL (ref 6.5–8.1)

## 2019-04-28 LAB — CBC WITH DIFFERENTIAL/PLATELET
Abs Immature Granulocytes: 0.01 10*3/uL (ref 0.00–0.07)
Basophils Absolute: 0.1 10*3/uL (ref 0.0–0.1)
Basophils Relative: 1 %
Eosinophils Absolute: 0.3 10*3/uL (ref 0.0–0.5)
Eosinophils Relative: 6 %
HCT: 32.7 % — ABNORMAL LOW (ref 36.0–46.0)
Hemoglobin: 10.2 g/dL — ABNORMAL LOW (ref 12.0–15.0)
Immature Granulocytes: 0 %
Lymphocytes Relative: 21 %
Lymphs Abs: 1 10*3/uL (ref 0.7–4.0)
MCH: 32.1 pg (ref 26.0–34.0)
MCHC: 31.2 g/dL (ref 30.0–36.0)
MCV: 102.8 fL — ABNORMAL HIGH (ref 80.0–100.0)
Monocytes Absolute: 0.3 10*3/uL (ref 0.1–1.0)
Monocytes Relative: 6 %
Neutro Abs: 3.3 10*3/uL (ref 1.7–7.7)
Neutrophils Relative %: 66 %
Platelets: 213 10*3/uL (ref 150–400)
RBC: 3.18 MIL/uL — ABNORMAL LOW (ref 3.87–5.11)
RDW: 13.1 % (ref 11.5–15.5)
WBC: 4.9 10*3/uL (ref 4.0–10.5)
nRBC: 0 % (ref 0.0–0.2)

## 2019-04-28 LAB — RETICULOCYTES
Immature Retic Fract: 12.5 % (ref 2.3–15.9)
RBC.: 3.15 MIL/uL — ABNORMAL LOW (ref 3.87–5.11)
Retic Count, Absolute: 49.8 10*3/uL (ref 19.0–186.0)
Retic Ct Pct: 1.6 % (ref 0.4–3.1)

## 2019-04-28 LAB — VITAMIN B12: Vitamin B-12: 383 pg/mL (ref 180–914)

## 2019-04-28 LAB — FOLATE: Folate: 26.5 ng/mL (ref >5.9)

## 2019-04-28 LAB — SAVE SMEAR(SSMR), FOR PROVIDER SLIDE REVIEW

## 2019-04-28 LAB — LACTATE DEHYDROGENASE: LDH: 169 U/L (ref 98–192)

## 2019-04-28 NOTE — Progress Notes (Signed)
[place]  [phone] Fax:([phone]) [phone]     ID: [name] DOB: 1931-03-06  MR#: [id number]  EHO#:[id number]  Patient Care Team: Tonia Ghent, MD as PCP - General Tamala Julian Lynnell Dike, MD as PCP - Cardiology (Cardiology) Magrinat, Virgie Dad, MD as Consulting Physician (Oncology) [name], MD as Consulting Physician (Otolaryngology) Marshell Garfinkel, MD as Consulting Physician (Pulmonary Disease) Scot Dock, NP OTHER MD:   CHIEF COMPLAINT: Estrogen receptor positive breast cancer  CURRENT TREATMENT: Anastrozole   INTERVAL HISTORY: Kiari is contacted today for follow-up and treatment of her estrogen receptor positive breast cancer. She was last seen here on 11/20/2018.  Her daughter [name] also participated in the visit  Charise continues on anastrozole.  She says she is tolerating it well and has no issues with this.  She is also taking Fosamax weekly for her osteoporosis.  She is tolerating this well.    REVIEW OF SYSTEMS: Carlton has some anemia that is worse comparing July and September hemoglobins.  She has heme positive stool.  She is taking Brilinta, and she is taking Omeprazole daily.  She was recommended to see GI, but she is hesitant to schedule due to the fact of not wanting a procedure.  She is otherwise feeling well.  Aubrianne has no fever, chills, chest pain, palpitations, cough, shortness of breath, bowel/bladder issues, nausea, or vomiting.  She is taking an iron supplement with good tolerance.    HISTORY OF CURRENT ILLNESS: From the original intake note:  The patient noted a change in the upper outer right breast, as well as a palpable lump in the right axilla, sometime mid-to-late 2019.  She did discuss it briefly with her husband but he was not in good health and she did not want to worry him. She had not had a prior mammogram for better than 2 decades.  More recently she brought this to medical attention and on 09/01/2018 she  underwent bilateral diagnostic mammography with tomography and bilateral breast and axillae ultrasonography at the breast center.  This found the breast density to be category B.  In the upper right breast there was an irregular mass with architectural distortion and 2 possible satellite masses.  In the right axilla there were 2 enlarged lymph nodes.  By physical exam there was a firm irregular mass in the upper outer quadrant of the right breast and a smooth easily palpable mass in the right axilla.  Ultrasound of the right breast confirmed a 4.2 cm irregular hypoechoic mass centered at 11:00.  There was no satellite mass seen.  In the right axilla there were 2 enlarged lymph nodes, as well as 1 with normal size but abnormal morphology.  The palpable enlarged lymph nodes measure 2.9 and 3.1 cm.  The third node measured 0.9 cm but had no hilar fat.  Biopsy of the right breast upper outer quadrant mass on 09/05/2018 showed ([id number]) invasive ductal carcinoma, grade 2, estrogen receptor 95% positive with strong staining intensity, estrogen receptor negative, HER-2 equivocal by immunohistochemistry but negative by FISH with a signals ratio of 1.16 and copy number per cell 1.80.  The MIB-1 was 15%  The right axillary lymph node biopsied was also positive for carcinoma.  On the left side there was a lucent round circumscribed mass in the upper outer quadrant.  By ultrasound this proved to be a fat necrosis oil cyst requiring no further follow-up  The patient's subsequent history is as detailed below.   PAST  MEDICAL HISTORY: Past Medical History:  Diagnosis Date  . Allergic rhinitis, cause unspecified   . Allergy, unspecified not elsewhere classified   . Anxiety   . Asthma   . Chronic kidney disease    frequency  . COPD (chronic obstructive pulmonary disease) ([place])   . Degeneration of intervertebral disc, site unspecified   . Diverticulosis of colon (without mention of hemorrhage)   . Esophageal  reflux   . Headache   . Insomnia, unspecified   . Myalgia and myositis, unspecified   . Need for prophylactic hormone replacement therapy (postmenopausal)   . Osteoarthritis of right knee 08/27/2011   R knee pain, s/p arthroscopy 2012 per [name]   . Osteoporosis, unspecified   . Other abnormal blood chemistry   . Other and unspecified hyperlipidemia   . Other chronic pain   . Stroke [facility])    mini strokes  . Unspecified essential hypertension   . Unspecified hypothyroidism     PAST SURGICAL HISTORY: Past Surgical History:  Procedure Laterality Date  . BACK SURGERY    . breast cystectomy    . CORONARY/GRAFT ACUTE MI REVASCULARIZATION N/A 01/29/2019   Procedure: CORONARY/GRAFT ACUTE MI REVASCULARIZATION;  Surgeon: Belva Crome, MD;  Location: [facility];  Service: Cardiovascular;  Laterality: N/A;  . JOINT REPLACEMENT     shoulder right  . KNEE ARTHROSCOPY     Right knee 2012  . MASS EXCISION Left 04/07/2015   Procedure: MINOR EXCISION OF MASS LEFT SMALL FINGER;  Surgeon: Leanora Cover, MD;  Location: [facility];  Service: Orthopedics;  Laterality: Left;  . SHOULDER SURGERY    . TOTAL KNEE ARTHROPLASTY  12/11/2011   Procedure: TOTAL KNEE ARTHROPLASTY;  Surgeon: Johnny Bridge, MD;  Location: [facility];  Service: Orthopedics;  Laterality: Right;  . UMBILICAL HERNIA REPAIR N/A 06/09/2013   Procedure: EXPLORATIORY LAPAROTOMY, HERNIA REPAIR UMBILICAL, INSERTION OF MESH;  Surgeon: Haywood Lasso, MD;  Location: [facility];  Service: General;  Laterality: N/A;    FAMILY HISTORY Family History  Problem Relation Age of Onset  . Lung cancer Brother        x2  . Hypertension Father   . Heart disease Father   . Heart disease Mother   . Asthma Sister   The patient's father died from a heart attack at age 8.  The patient's mother died suddenly at age 22 while in apparent good health.  The patient is the youngest of 6 siblings, the other ones all deceased.  One  sister developed breast cancer in her 35s.  There is no other family history of breast, ovarian, pancreatic, or prostate cancer to the patient's knowledge   GYNECOLOGIC HISTORY:  No LMP recorded. Patient is postmenopausal. Menarche: 85 years old Age at first live birth: 85 years old [place] P 2 LMP approximately mid 14s HRT treated with estropitate ([facility]) for "many years" Hysterectomy?  No Salpingo-oophorectomy?  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  Her husband was a high school principal.  He is now deceased.  The patient has 2 daughters, Tomi Bamberger "[name]" [place], who lives in [place] and is a retired Paediatric nurse; and Aryana Wonnacott, who works for [employer] and recently moved to [place].  The patient has 4 grandchildren.  One grandson unfortunately died in a motor vehicle accident.  The patient attends [employer].  Daneen generally lives by herself but currently her daughter [name]  is staying with her.   ADVANCED DIRECTIVES: The patient has named both her daughters as healthcare powers of attorney but currently daughter [name] would be first since she is the one in town   HEALTH MAINTENANCE: Social History   Tobacco Use  . Smoking status: Former Smoker    Packs/day: 1.50    Years: 10.00    Pack years: 15.00    Types: Cigarettes    Quit date: 07/09/1985    Years since quitting: 33.8  . Smokeless tobacco: Never Used  . Tobacco comment: 1 1/2 ppd x 20 years  Substance Use Topics  . Alcohol use: No    Alcohol/week: 0.0 standard drinks  . Drug use: No     Colonoscopy: n/a  PAP: n/a  Bone density: Remote   Allergies  Allergen Reactions  . Doxycycline Other (See Comments)    GI upset  . Sulfadiazine Other (See Comments)    REACTION: Fever, aches  . Sulfa Antibiotics Rash    Current Outpatient Medications  Medication Sig Dispense Refill  . alendronate (FOSAMAX) 70 MG tablet Take 1 tablet (70 mg total) by  mouth once a week. Take with a full glass of water on an empty stomach. 4 tablet 6  . ALPRAZolam (XANAX) 1 MG tablet TAKE 1/2 TABLET BY MOUTH 2 TIMES DAILY AS NEEDED FOR ANXIETY. 30 tablet 2  . anastrozole (ARIMIDEX) 1 MG tablet Take 1 tablet (1 mg total) by mouth daily. 90 tablet 4  . aspirin 81 MG chewable tablet Chew 1 tablet (81 mg total) by mouth daily. 30 tablet 0  . atorvastatin (LIPITOR) 80 MG tablet Take 1 tablet (80 mg total) by mouth daily at 6 PM. 30 tablet 0  . bisoprolol (ZEBETA) 5 MG tablet Take 0.5 tablets (2.5 mg total) by mouth daily. 30 tablet 0  . Calcium Carbonate (CALTRATE 600 PO) Take 600 mg by mouth daily.     . cholecalciferol (VITAMIN D) 1000 units tablet Take 1 tablet (1,000 Units total) by mouth daily.    . diclofenac sodium (VOLTAREN) 1 % GEL APPLY 4 GRAMS TO AFFECTED AREA 2 TIMES A DAY. 200 g 3  . DULoxetine (CYMBALTA) 60 MG capsule TAKE 1 CAPSULE BY MOUTH DAILY 90 capsule 2  . ferrous sulfate (FERROUSUL) 325 (65 FE) MG tablet Take 1 tablet (325 mg total) by mouth daily with breakfast.    . gabapentin (NEURONTIN) 300 MG capsule 2 tabs in the AM and 1 tab in the afternoon 270 capsule 1  . hydroxypropyl methylcellulose / hypromellose (ISOPTO TEARS / GONIOVISC) 2.5 % ophthalmic solution Place 1 drop into both eyes 3 (three) times daily as needed for dry eyes.    Marland Kitchen levothyroxine (SYNTHROID) 112 MCG tablet TAKE 1 TABLET BY MOUTH DAILY BEFORE BREAKFAST. 90 tablet 1  . losartan (COZAAR) 25 MG tablet Take 0.5 tablets (12.5 mg total) by mouth daily. 30 tablet 2  . Multiple Vitamin (MULTIVITAMIN WITH MINERALS) TABS tablet Take 1 tablet by mouth daily.    . Multiple Vitamins-Minerals (OCUVITE PRESERVISION PO) Take 1 tablet by mouth daily.    . nitroGLYCERIN (NITROSTAT) 0.4 MG SL tablet Place 1 tablet (0.4 mg total) under the tongue every 5 (five) minutes as needed for chest pain. 25 tablet 12  . omeprazole (PRILOSEC) 20 MG capsule Take 1 capsule (20 mg total) by mouth 2 (two)  times daily before a meal. 60 capsule 3  . OXYGEN Inhale 3-3.5 L into the lungs continuous.    Marland Kitchen PROAIR HFA  108 (90 Base) MCG/ACT inhaler INHALE 1 TO 2 PUFFS INTO THE LUNGS EVERY 6 HOURS AS NEEDED FOR WHEEZING OR SHORTNESS OF BREATH. 8.5 g 12  . ticagrelor (BRILINTA) 90 MG TABS tablet Take 1 tablet (90 mg total) by mouth 2 (two) times daily. 180 tablet 3  . tiotropium (SPIRIVA HANDIHALER) 18 MCG inhalation capsule PLACE 1 CAPSULE INTO INHALER AND INHALE ONCE A DAY 30 capsule 12  . traMADol (ULTRAM) 50 MG tablet Take 1 tablet (50 mg total) by mouth every 12 (twelve) hours as needed (for pain). 20 tablet 0  . traZODone (DESYREL) 50 MG tablet Take 3 tablets (150 mg total) by mouth at bedtime as needed for sleep. 90 tablet 6   No current facility-administered medications for this visit.     OBJECTIVE: He white woman who appears well  Vitals:   04/28/19 1400  BP: (!) 151/75  Pulse: 72  Resp: 18  Temp: 98.7 F (37.1 C)  SpO2: 96%   Wt Readings from Last 3 Encounters:  04/28/19 144 lb 8 oz (65.5 kg)  02/16/19 140 lb (63.5 kg)  02/05/19 145 lb (65.8 kg)   Body mass index is 28.22 kg/m.    ECOG FS:2 - Symptomatic, <50% confined to bed GENERAL: Patient is a well appearing female in no acute distress HEENT:  Sclerae anicteric.  Oropharynx clear and moist. No ulcerations or evidence of oropharyngeal candidiasis. Neck is supple.  NODES:  No cervical, supraclavicular, or axillary lymphadenopathy palpated.  BREAST EXAM: difficult to palpate anything in right upper outer breast, however in right medial breast I do palpate about a 1-2cm nodule LUNGS:  Clear to auscultation bilaterally.  No wheezes or rhonchi. HEART:  Regular rate and rhythm. No murmur appreciated. ABDOMEN:  Soft, nontender.  Positive, normoactive bowel sounds. No organomegaly palpated. MSK:  No focal spinal tenderness to palpation. Full range of motion bilaterally in the upper extremities. EXTREMITIES:  No peripheral edema.    SKIN:  Clear with no obvious rashes or skin changes. No nail dyscrasia. NEURO:  Nonfocal. Well oriented.  Appropriate affect.     LAB RESULTS:  CMP     Component Value Date/Time   NA 143 03/27/2019 1140   NA 144 02/05/2019 1019   K 4.0 03/27/2019 1140   CL 103 03/27/2019 1140   CO2 36 (H) 03/27/2019 1140   GLUCOSE 100 (H) 03/27/2019 1140   BUN 13 03/27/2019 1140   BUN 13 02/05/2019 1019   CREATININE 0.69 03/27/2019 1140   CREATININE 0.62 08/22/2018 1625   CALCIUM 9.3 03/27/2019 1140   PROT 6.6 03/19/2019 1105   ALBUMIN 4.1 03/19/2019 1105   AST 37 03/19/2019 1105   ALT 17 03/19/2019 1105   ALKPHOS 47 03/19/2019 1105   BILITOT 0.3 03/19/2019 1105   GFRNONAA 84 02/05/2019 1019   GFRAA 97 02/05/2019 1019    No results found for: TOTALPROTELP, ALBUMINELP, A1GS, A2GS, BETS, BETA2SER, GAMS, MSPIKE, SPEI  No results found for: KPAFRELGTCHN, LAMBDASER, KAPLAMBRATIO  Lab Results  Component Value Date   WBC 4.1 03/27/2019   NEUTROABS 2.5 03/27/2019   HGB 9.3 (L) 03/27/2019   HCT 28.1 (L) 03/27/2019   MCV 100.7 (H) 03/27/2019   PLT 215.0 03/27/2019      Chemistry      Component Value Date/Time   NA 143 03/27/2019 1140   NA 144 02/05/2019 1019   K 4.0 03/27/2019 1140   CL 103 03/27/2019 1140   CO2 36 (H) 03/27/2019 1140   BUN 13  03/27/2019 1140   BUN 13 02/05/2019 1019   CREATININE 0.69 03/27/2019 1140   CREATININE 0.62 08/22/2018 1625      Component Value Date/Time   CALCIUM 9.3 03/27/2019 1140   ALKPHOS 47 03/19/2019 1105   AST 37 03/19/2019 1105   ALT 17 03/19/2019 1105   BILITOT 0.3 03/19/2019 1105       No results found for: LABCA2  No components found for: [id number]  No results for input(s): INR in the last 168 hours.  No results found for: LABCA2  No results found for: GBT517  No results found for: OHY073  No results found for: XTG626  No results found for: CA2729  No components found for: HGQUANT  No results found for: CEA1 / No  results found for: CEA1   No results found for: AFPTUMOR  No results found for: CHROMOGRNA  No results found for: PSA1  No visits with results within 3 Day(s) from this visit.  Latest known visit with results is:  Appointment on 04/14/2019  Component Date Value Ref Range Status  . Fecal Occult Bld 04/14/2019 Positive* Negative Final    (this displays the last labs from the last 3 days)  No results found for: TOTALPROTELP, ALBUMINELP, A1GS, A2GS, BETS, BETA2SER, GAMS, MSPIKE, SPEI (this displays SPEP labs)  No results found for: KPAFRELGTCHN, LAMBDASER, KAPLAMBRATIO (kappa/lambda light chains)  No results found for: HGBA, HGBA2QUANT, HGBFQUANT, HGBSQUAN (Hemoglobinopathy evaluation)   No results found for: LDH  Lab Results  Component Value Date   IRON 34 (L) 08/22/2018   TIBC 294 08/22/2018   IRONPCTSAT 12 (L) 08/22/2018   (Iron and TIBC)  No results found for: FERRITIN  Urinalysis    Component Value Date/Time   COLORURINE YELLOW 12/27/2018 0217   APPEARANCEUR CLEAR 12/27/2018 0217   LABSPEC 1.012 12/27/2018 0217   PHURINE 6.0 12/27/2018 0217   GLUCOSEU NEGATIVE 12/27/2018 0217   GLUCOSEU NEGATIVE 10/24/2016 1429   HGBUR NEGATIVE 12/27/2018 0217   BILIRUBINUR NEGATIVE 12/27/2018 0217   BILIRUBINUR negative 09/20/2016 1625   KETONESUR NEGATIVE 12/27/2018 0217   PROTEINUR NEGATIVE 12/27/2018 0217   UROBILINOGEN 0.2 10/24/2016 1429   NITRITE NEGATIVE 12/27/2018 0217   LEUKOCYTESUR NEGATIVE 12/27/2018 0217     STUDIES: No results found.  ELIGIBLE FOR AVAILABLE RESEARCH PROTOCOL: no  ASSESSMENT: 85 y.o. [place] woman status post right breast upper outer quadrant biopsy 09/05/2018 for a clinical T2 N1, stage IIB invasive ductal carcinoma, grade 2, estrogen receptor positive, progesterone receptor negative, HER-2 not amplified, with an MIB-1 of 15%.  (1) anastrozole started 10/09/2018  (a) bone density 02/24/2019 shows osteoporosis with a T score of -4.4   (b) Fosamax/alendronate started 03/10/2019  PLAN: Maleeyah is here today for follow up of her estrogen positive breast cancer and her anemia.  She is feeling well today.  She is tolerating Anastrozole well and will continue this.  She is taking fosamax weekly and is doing well.  She will continue this.    Svara has worsening anemia, and heme positive stool.  She is taking a daily iron supplement and is tolerating it well.  We reviewed the concern regarding her anemia and the heme positive stool in detail.  She has been recommended to see GI, however doesn't want to go.  We will repeat her labs today, and add on folate, b12, ferritin, and iron studies.  I recommended she consider seeing GI.  She is going to think about it.    We will call her once we  get her results.  She was recommended to continue with the appropriate pandemic precautions. She knows to call for any questions that may arise between now and her next appointment.  We are happy to see her sooner if needed.  A total of (30) minutes of face-to-face time was spent with this patient with greater than 50% of that time in counseling and care-coordination.  Wilber Bihari, NP  04/28/19 2:22 PM Medical Oncology and Hematology [facility] [address] [place], [place] [address] Tel. [phone]    Fax. [phone]

## 2019-04-29 ENCOUNTER — Encounter: Payer: Self-pay | Admitting: Adult Health

## 2019-04-29 LAB — IRON AND TIBC
Iron: 117 ug/dL (ref 41–142)
Saturation Ratios: 38 % (ref 21–57)
TIBC: 309 ug/dL (ref 236–444)
UIBC: 192 ug/dL (ref 120–384)

## 2019-04-29 LAB — FERRITIN: Ferritin: 27 ng/mL (ref 11–307)

## 2019-04-30 ENCOUNTER — Telehealth: Payer: Self-pay | Admitting: Oncology

## 2019-04-30 NOTE — Telephone Encounter (Signed)
Scheduled appt per 10/21 sch message -- pt is aware of appt date and time   

## 2019-05-06 ENCOUNTER — Telehealth: Payer: Self-pay

## 2019-05-06 ENCOUNTER — Other Ambulatory Visit: Payer: Self-pay | Admitting: Family Medicine

## 2019-05-06 NOTE — Telephone Encounter (Signed)
Agreed.  Thanks.  Will talk with patient tomorrow.

## 2019-05-06 NOTE — Telephone Encounter (Signed)
Pt said she spoke with someone at office a few wks ago about GI referral ; this is note in pts referral note with Charmayne... ---- Message ----- From: Hoy Register Sent: 04/23/2019   1:41 PM EDT To: Toney Sang afternoon, I spoke to pt.  She declined to schedule at this time.  She stated that she has had too many appointments with cardiologist, with PCP and with other MDs and that she's not feeling any better.  She feels hopeless that nothing can be done.  She stated: "I don't want to schedule anything right now.  I'm not saying no forever but no for now."  Thank you for the referral.   Pt calling today; pt said since she had spoken with me pt has had a heart attack and recently been dx with breast CA. Pt is on O2 round the clock. Pt said she thinks it was Dr Jana Hakim that also suggested pt should see GI specialist. Pt said her stomach does hurt and pt does not feel good; pt knows she gave OK to Dr Damita Dunnings to do GI referral but pt said she feels so bad and so much going on with her health that she does not want to see GI doctor. Pt wants to know is there anything can be done for her besides GI referral and colonoscopy. Pt does not think she can go thru it and wants to talk with Dr Damita Dunnings. Pt scheduled 30' phone visit with Dr Damita Dunnings on 05/07/19 at [facility] to Dr Damita Dunnings.

## 2019-05-07 ENCOUNTER — Ambulatory Visit (INDEPENDENT_AMBULATORY_CARE_PROVIDER_SITE_OTHER): Payer: Medicare Other | Admitting: Family Medicine

## 2019-05-07 DIAGNOSIS — I2119 ST elevation (STEMI) myocardial infarction involving other coronary artery of inferior wall: Secondary | ICD-10-CM

## 2019-05-07 DIAGNOSIS — E785 Hyperlipidemia, unspecified: Secondary | ICD-10-CM | POA: Diagnosis not present

## 2019-05-07 DIAGNOSIS — D649 Anemia, unspecified: Secondary | ICD-10-CM | POA: Diagnosis not present

## 2019-05-07 NOTE — Progress Notes (Signed)
Interactive audio and video telecommunications were attempted between this provider and patient, however failed, due to patient having technical difficulties OR patient did not have access to video capability.  We continued and completed visit with audio only.   Virtual Visit via Telephone Note  I connected with patient on 05/07/19  at 3:22 PM  by telephone and verified that I am speaking with the correct person using two identifiers.  Location of patient: home  Location of MD: [place] Name of referring provider (if blank then none associated): Names per persons and role in encounter:  MD: Earlyne Iba, Patient: name listed above.    I discussed the limitations, risks, security and privacy concerns of performing an evaluation and management service by telephone and the availability of in person appointments. I also discussed with the patient that there may be a patient responsible charge related to this service. The patient expressed understanding and agreed to proceed.  CC: anemia  History of Present Illness:  She is still having diffuse aches, in setting of statin US.  D/w pt.  I want to get update from cardiology.  Tramadol didn't help.  She is using a walker at baseline.    Anemia.  HGB improved with iron replacement.  Prev FOBT positive.  We talked about the options for seeing GI or deferring evaluation at this point.  She does not want to go through any extra procedures at this point.  She wants to decline GI eval and continue with iron replacement.  We can recheck CBC/iron episodically.    I'll update the hematology about that.      Observations/Objective:  nad Speech wnl  Assessment and Plan:  She is still having diffuse aches, in setting of statin US.  D/w pt.  I want to get update from cardiology.  Tramadol didn't help.  She is using a walker at baseline.     Anemia.  HGB improved with iron replacement.  Prev FOBT positive.  We talked about the options for seeing GI  or deferring evaluation at this point.  She does not want to go through any extra procedures at this point.  She wants to decline GI eval and continue with iron replacement.  We can recheck CBC/iron episodically.    I'll update the hematology about that.     Follow Up Instructions: See above.   I discussed the assessment and treatment plan with the patient. The patient was provided an opportunity to ask questions and all were answered. The patient agreed with the plan and demonstrated an understanding of the instructions.   The patient was advised to call back or seek an in-person evaluation if the symptoms worsen or if the condition fails to improve as anticipated.  I provided 15 minutes of non-face-to-face time during this encounter.  Elsie Stain, MD

## 2019-05-11 ENCOUNTER — Encounter: Payer: Self-pay | Admitting: Family Medicine

## 2019-05-11 ENCOUNTER — Telehealth: Payer: Self-pay | Admitting: Family Medicine

## 2019-05-11 NOTE — Assessment & Plan Note (Signed)
She is still having diffuse aches, in setting of statin US.  D/w pt.  I want to get update from cardiology.  Tramadol didn't help.  She is using a walker at baseline.

## 2019-05-11 NOTE — Assessment & Plan Note (Signed)
HGB improved with iron replacement.  Prev FOBT positive.  We talked about the options for seeing GI or deferring evaluation at this point.  She does not want to go through any extra procedures at this point.  She wants to decline GI eval and continue with iron replacement.  We can recheck CBC/iron episodically.    I'll update the hematology about that.

## 2019-05-11 NOTE — Telephone Encounter (Signed)
I realize this patient has coronary disease with a recent event but she is also having diffuse aches that may be related to her statin.  Is there any way to decrease her statin dose or change to a different statin to see if she can tolerate that better?  I appreciate your input.

## 2019-05-11 NOTE — Telephone Encounter (Signed)
Patient declined GI eval for her anemia and positive IFOB.  She wanted to check serial CBC and iron levels, since her iron and hemoglobin were some better.  She wants to avoid procedures.  I did want to make plans with you about checking her blood counts either here or at your clinic.  If you need me to do it then please let me know.  For now, I will defer since she has follow-up scheduled at your clinic.  I appreciate your help.

## 2019-05-11 NOTE — Assessment & Plan Note (Deleted)
She is still having diffuse aches, in setting of statin US.  D/w pt.  I want to get update from cardiology.  Tramadol didn't help.  She is using a walker at baseline.

## 2019-05-12 NOTE — Telephone Encounter (Signed)
Recommend stopping Atorvastatin for 2 weeks. Then start Rosuvastatin 10 mg daily

## 2019-05-13 MED ORDER — ROSUVASTATIN CALCIUM 10 MG PO TABS: 10.0000 mg | ORAL_TABLET | Freq: Every day | ORAL | 3 refills | Status: DC

## 2019-05-13 NOTE — Telephone Encounter (Signed)
Call pt.  See below.  Would stop atorvastatin to see if aches get better.   Stay off med for 2 weeks and then start rosuvastatin.  rx sent.   If she has return of aches on rosuvastatin, then let me know.  Thanks.

## 2019-05-13 NOTE — Addendum Note (Signed)
Addended by: Tonia Ghent on: 05/13/2019 03:06 PM   Modules accepted: Orders

## 2019-05-14 ENCOUNTER — Telehealth: Payer: Self-pay | Admitting: *Deleted

## 2019-05-14 ENCOUNTER — Other Ambulatory Visit: Payer: Self-pay | Admitting: Oncology

## 2019-05-14 MED ORDER — ALPRAZOLAM 1 MG PO TABS: ORAL_TABLET | ORAL | Status: DC

## 2019-05-14 NOTE — Telephone Encounter (Signed)
If she is shaky from anxiety, then okay to use TID prn.  Just make sure this isn't shaky from relative hypoglycemia or another cause. Thanks.

## 2019-05-14 NOTE — Telephone Encounter (Signed)
Patient advised and repeated instructions correctly.

## 2019-05-14 NOTE — Telephone Encounter (Signed)
She has labs here 11/18 and labs and a visit in December. Will keeop you posted  Gus

## 2019-05-14 NOTE — Telephone Encounter (Signed)
Patient advised.

## 2019-05-14 NOTE — Telephone Encounter (Signed)
Patient is asking if she can take the Xanax (1/2 tablet 3 times a day instead of 1/2 tablet  2 times a day)?  She says she gets really shaky sometimes in between her current doses.

## 2019-05-14 NOTE — Telephone Encounter (Signed)
Pt called to verify that she is supposed to start on rosuvastatin. I advised pt per note that pt is supposed to stop Atorvastatin for 2 wks prior to starting on rosuvastatin 10 mg one daily. Pt said she has not had atorvastatin in a long time and pt can not remember who stopped the atorvastatin but pt is sure she is not taking atorvastatin or lipitor and pt will start rosuvastatin. Advised pt if she has any aches after starting the rosuvastatin to let Dr Damita Dunnings know. Pt voiced understanding.

## 2019-05-17 NOTE — Telephone Encounter (Signed)
Noted.  Thanks.  I appreciate the help of all involved.   

## 2019-05-21 NOTE — Telephone Encounter (Signed)
Pt left v/m that [facility] has not received instructions that pt can take xanax 1 mg taking 1/2 tab tid prn. Pt request [facility] notified. Per hx med list rx for xanax 1 mg taking 1/2 tab bid prn was sent # 30 x 2 on 04/24/19.

## 2019-05-22 MED ORDER — ALPRAZOLAM 1 MG PO TABS: ORAL_TABLET | ORAL | 2 refills | Status: DC

## 2019-05-22 NOTE — Addendum Note (Signed)
Addended by: Tonia Ghent on: 05/22/2019 07:15 AM   Modules accepted: Orders

## 2019-05-22 NOTE — Telephone Encounter (Signed)
resent. Thanks.

## 2019-05-27 ENCOUNTER — Other Ambulatory Visit: Payer: Medicare Other

## 2019-05-27 ENCOUNTER — Telehealth: Payer: Self-pay | Admitting: Oncology

## 2019-05-27 NOTE — Telephone Encounter (Signed)
Returned patient's phone call regarding cancelling an appointment, per patient's request 11/18 appointment has been cancelled. Patient will give a call back when ready to reschedule.

## 2019-06-16 ENCOUNTER — Encounter: Payer: Self-pay | Admitting: *Deleted

## 2019-06-22 ENCOUNTER — Other Ambulatory Visit: Payer: Self-pay | Admitting: Family Medicine

## 2019-06-23 NOTE — Progress Notes (Signed)
No show

## 2019-06-24 ENCOUNTER — Inpatient Hospital Stay: Payer: Medicare Other

## 2019-06-24 ENCOUNTER — Encounter: Payer: Self-pay | Admitting: Oncology

## 2019-06-24 ENCOUNTER — Inpatient Hospital Stay: Payer: Medicare Other | Attending: Oncology | Admitting: Oncology

## 2019-06-24 DIAGNOSIS — Z170 Estrogen receptor positive status [ER+]: Secondary | ICD-10-CM

## 2019-06-24 DIAGNOSIS — C50411 Malignant neoplasm of upper-outer quadrant of right female breast: Secondary | ICD-10-CM

## 2019-07-12 DIAGNOSIS — J449 Chronic obstructive pulmonary disease, unspecified: Secondary | ICD-10-CM | POA: Diagnosis not present

## 2019-07-15 ENCOUNTER — Telehealth: Payer: Self-pay

## 2019-07-15 NOTE — Telephone Encounter (Signed)
Noted.  Agreed.  Thanks.  I will await the ER notes.

## 2019-07-15 NOTE — Telephone Encounter (Signed)
[name] (DPR signed) left v/m that pt has been dizzy for 3 days and does not feel good. I spoke with [name] and dizziness where room is spinning started on 07/13/19. Pt has H/A off and on for 1 wk and today has H/A with pain level 9. Pt has dry cough.Pt has had diarrhea loose stools) x 3 today. No fever, chills, and no abd pain.  pt is having difficulty walking; pt is using a walker; pt seems much weaker than usual. Pt almost fainted this morning while in bathroom but pt did not lose consciousness.  Pt is more SOB than usual. When pt has been up walking she has to sit and recover before can talk.pt has runny nose but no S/T. For 2 days has not been eating and today pt ate a fruit cup and 2 crackers. Pt is trying to drink. Pt is more fatigued than usual. Pt does have dry mouth; pt urinated 25 minutes ago;[name] is not sure of color and pt is voiding a small amt.  Pt is presently sleeping. [name] is concerned about pt and will have 911 take pt to ED (not sure which ED will go to until EMS gets to pts home.) FYI to Dr Damita Dunnings.

## 2019-07-23 ENCOUNTER — Ambulatory Visit (INDEPENDENT_AMBULATORY_CARE_PROVIDER_SITE_OTHER): Payer: Medicare PPO | Admitting: Family Medicine

## 2019-07-23 ENCOUNTER — Other Ambulatory Visit: Payer: Self-pay

## 2019-07-23 ENCOUNTER — Encounter: Payer: Self-pay | Admitting: Family Medicine

## 2019-07-23 DIAGNOSIS — G894 Chronic pain syndrome: Secondary | ICD-10-CM | POA: Diagnosis not present

## 2019-07-23 DIAGNOSIS — I10 Essential (primary) hypertension: Secondary | ICD-10-CM

## 2019-07-23 MED ORDER — PREDNISONE 10 MG PO TABS: 10.0000 mg | ORAL_TABLET | Freq: Every day | ORAL | 0 refills | Status: DC

## 2019-07-23 NOTE — Progress Notes (Signed)
Interactive audio and video telecommunications were attempted between this provider and patient, however failed, due to patient having technical difficulties OR patient did not have access to video capability.  We continued and completed visit with audio only.   Virtual Visit via Telephone Note  I connected with patient on 07/23/19  at 12:59 PM  by telephone and verified that I am speaking with the correct person using two identifiers.  Location of patient:  Home  Location of MD: [facility] Name of referring provider (if blank then none associated): Names per persons and role in encounter:  MD: Earlyne Iba, Patient: name listed above.    I discussed the limitations, risks, security and privacy concerns of performing an evaluation and management service by telephone and the availability of in person appointments. I also discussed with the patient that there may be a patient responsible charge related to this service. The patient expressed understanding and agreed to proceed.  CC: pain.    History of Present Illness: she quit taking tramadol since it wasn't helping.  She is having diffuse pain, arms, legs, shoulders, hips, bilaterally, also having back pain at baseline.  Now with more headaches in the afternoon and night.  No falls, no trauma.  She is using walker/cane at baseline.  Tylenol helps some with aches.   She had vertigo last week but that resolved in the meantime.  She clearly had room spinning that got better after taking dramamine or similar.  This is separate from feeling lightheaded.   115/58 this AM.  No fevers.  She has occasionally been lightheaded.   Observations/Objective: nad Speech wnl  Assessment and Plan:  Hypertension.  Lightheaded episodically.  Reasonable to stop losartan for now.  She isn't dehydrated.  Normal UOP, no dysuria.  Urine isn't dark.  She can let me know if she is not feeling better.  Vertigo.  Resolved.  We talked about using OTC  suppositories for constipation, used about 1-2 times per month, as needed.    Pain.  Discussed likely OA contribution, would try short course of prednisone and she'll update me next week, sooner if needed. It makes sense to stop crestor in the meantime and have her update me in a few days.  Unclear if Crestor is contributing to diffuse aches.  Follow Up Instructions: See above.   I discussed the assessment and treatment plan with the patient. The patient was provided an opportunity to ask questions and all were answered. The patient agreed with the plan and demonstrated an understanding of the instructions.   The patient was advised to call back or seek an in-person evaluation if the symptoms worsen or if the condition fails to improve as anticipated.  I provided 12 minutes of non-face-to-face time during this encounter.  Elsie Stain, MD

## 2019-07-26 NOTE — Assessment & Plan Note (Addendum)
Discussed likely OA contribution, would try short course of prednisone and she'll update me next week, sooner if needed.  Steroid cautions discussed with patient. It makes sense to stop crestor in the meantime and have her update me in a few days.  Unclear if Crestor is contributing to diffuse aches.

## 2019-07-26 NOTE — Assessment & Plan Note (Signed)
Lightheaded episodically..  Reasonable to stop losartan for now.  She isn't dehydrated.  Normal UOP, no dysuria.  Urine isn't dark.  She can let me know if she is not feeling better.

## 2019-07-28 ENCOUNTER — Ambulatory Visit: Payer: Medicare Other | Attending: Internal Medicine

## 2019-07-28 DIAGNOSIS — Z23 Encounter for immunization: Secondary | ICD-10-CM | POA: Insufficient documentation

## 2019-07-28 NOTE — Progress Notes (Signed)
   Covid-19 Vaccination Clinic  Name:  [name]    MRN: [id number] DOB: [birth year]  07/28/2019  [name] was observed post Covid-19 immunization for 15 minutes without incidence. She was provided with Vaccine Information Sheet and instruction to access the V-Safe system.   [name] was instructed to call 911 with any severe reactions post vaccine: Marland Kitchen Difficulty breathing  . Swelling of your face and throat  . A fast heartbeat  . A bad rash all over your body  . Dizziness and weakness    Immunizations Administered    Name Date Dose VIS Date Route   Pfizer COVID-19 Vaccine 07/28/2019  2:29 PM 0.3 mL 06/19/2019 Intramuscular   Manufacturer: [place]   Lot: [id number]   [name]: [id number]

## 2019-08-12 DIAGNOSIS — J449 Chronic obstructive pulmonary disease, unspecified: Secondary | ICD-10-CM | POA: Diagnosis not present

## 2019-08-18 ENCOUNTER — Ambulatory Visit: Payer: Medicare Other

## 2019-08-18 ENCOUNTER — Ambulatory Visit: Payer: Medicare PPO | Attending: Internal Medicine

## 2019-08-18 DIAGNOSIS — Z23 Encounter for immunization: Secondary | ICD-10-CM | POA: Insufficient documentation

## 2019-08-18 NOTE — Progress Notes (Signed)
   Covid-19 Vaccination Clinic  Name:  [name]    MRN: [id number] DOB: Nov 15, 1930  08/18/2019  [name] was observed post Covid-19 immunization for 15 minutes without incidence. She was provided with Vaccine Information Sheet and instruction to access the V-Safe system.   Ms. Dural was instructed to call 911 with any severe reactions post vaccine: Marland Kitchen Difficulty breathing  . Swelling of your face and throat  . A fast heartbeat  . A bad rash all over your body  . Dizziness and weakness    Immunizations Administered    Name Date Dose VIS Date Route   Pfizer COVID-19 Vaccine 08/18/2019 10:41 AM 0.3 mL 06/19/2019 Intramuscular   Manufacturer: [place]   Lot: [id number]   [place]: [id number]

## 2019-08-24 ENCOUNTER — Other Ambulatory Visit: Payer: Self-pay | Admitting: *Deleted

## 2019-08-24 NOTE — Telephone Encounter (Signed)
Faxed refill request. Alprazolam Last office visit:   07/23/2019 Last Filled:     45 tablet 2 05/22/2019  Please advise.

## 2019-08-25 MED ORDER — ALPRAZOLAM 1 MG PO TABS: ORAL_TABLET | ORAL | 2 refills | Status: DC

## 2019-08-25 NOTE — Telephone Encounter (Signed)
Sent. Thanks.   

## 2019-09-07 ENCOUNTER — Observation Stay (HOSPITAL_COMMUNITY): Payer: Medicare PPO

## 2019-09-07 ENCOUNTER — Emergency Department (HOSPITAL_COMMUNITY): Payer: Medicare PPO

## 2019-09-07 ENCOUNTER — Observation Stay (HOSPITAL_COMMUNITY)
Admission: EM | Admit: 2019-09-07 | Discharge: 2019-09-09 | Disposition: A | Discharge status: Home / Self Care | Payer: Medicare PPO | Attending: Family Medicine | Admitting: Family Medicine

## 2019-09-07 ENCOUNTER — Telehealth: Payer: Self-pay | Admitting: *Deleted

## 2019-09-07 ENCOUNTER — Other Ambulatory Visit: Payer: Self-pay

## 2019-09-07 ENCOUNTER — Encounter (HOSPITAL_COMMUNITY): Payer: Self-pay | Admitting: Internal Medicine

## 2019-09-07 DIAGNOSIS — Z79811 Long term (current) use of aromatase inhibitors: Secondary | ICD-10-CM | POA: Insufficient documentation

## 2019-09-07 DIAGNOSIS — J9610 Chronic respiratory failure, unspecified whether with hypoxia or hypercapnia: Secondary | ICD-10-CM | POA: Insufficient documentation

## 2019-09-07 DIAGNOSIS — E559 Vitamin D deficiency, unspecified: Secondary | ICD-10-CM | POA: Insufficient documentation

## 2019-09-07 DIAGNOSIS — I712 Thoracic aortic aneurysm, without rupture: Secondary | ICD-10-CM | POA: Insufficient documentation

## 2019-09-07 DIAGNOSIS — Z853 Personal history of malignant neoplasm of breast: Secondary | ICD-10-CM | POA: Diagnosis not present

## 2019-09-07 DIAGNOSIS — D539 Nutritional anemia, unspecified: Secondary | ICD-10-CM

## 2019-09-07 DIAGNOSIS — I255 Ischemic cardiomyopathy: Secondary | ICD-10-CM | POA: Insufficient documentation

## 2019-09-07 DIAGNOSIS — G9341 Metabolic encephalopathy: Secondary | ICD-10-CM | POA: Insufficient documentation

## 2019-09-07 DIAGNOSIS — Z955 Presence of coronary angioplasty implant and graft: Secondary | ICD-10-CM | POA: Insufficient documentation

## 2019-09-07 DIAGNOSIS — Z7952 Long term (current) use of systemic steroids: Secondary | ICD-10-CM | POA: Insufficient documentation

## 2019-09-07 DIAGNOSIS — I714 Abdominal aortic aneurysm, without rupture: Secondary | ICD-10-CM | POA: Diagnosis not present

## 2019-09-07 DIAGNOSIS — D649 Anemia, unspecified: Secondary | ICD-10-CM

## 2019-09-07 DIAGNOSIS — Z8673 Personal history of transient ischemic attack (TIA), and cerebral infarction without residual deficits: Secondary | ICD-10-CM | POA: Diagnosis not present

## 2019-09-07 DIAGNOSIS — E039 Hypothyroidism, unspecified: Secondary | ICD-10-CM | POA: Diagnosis not present

## 2019-09-07 DIAGNOSIS — R4182 Altered mental status, unspecified: Secondary | ICD-10-CM

## 2019-09-07 DIAGNOSIS — R29810 Facial weakness: Secondary | ICD-10-CM | POA: Diagnosis not present

## 2019-09-07 DIAGNOSIS — Z20822 Contact with and (suspected) exposure to covid-19: Secondary | ICD-10-CM | POA: Insufficient documentation

## 2019-09-07 DIAGNOSIS — Z7983 Long term (current) use of bisphosphonates: Secondary | ICD-10-CM | POA: Insufficient documentation

## 2019-09-07 DIAGNOSIS — Z91128 Patient's intentional underdosing of medication regimen for other reason: Secondary | ICD-10-CM | POA: Insufficient documentation

## 2019-09-07 DIAGNOSIS — Z170 Estrogen receptor positive status [ER+]: Secondary | ICD-10-CM | POA: Insufficient documentation

## 2019-09-07 DIAGNOSIS — R258 Other abnormal involuntary movements: Secondary | ICD-10-CM | POA: Diagnosis not present

## 2019-09-07 DIAGNOSIS — E785 Hyperlipidemia, unspecified: Secondary | ICD-10-CM

## 2019-09-07 DIAGNOSIS — R519 Headache, unspecified: Secondary | ICD-10-CM | POA: Diagnosis not present

## 2019-09-07 DIAGNOSIS — M810 Age-related osteoporosis without current pathological fracture: Secondary | ICD-10-CM | POA: Diagnosis not present

## 2019-09-07 DIAGNOSIS — Z96651 Presence of right artificial knee joint: Secondary | ICD-10-CM | POA: Insufficient documentation

## 2019-09-07 DIAGNOSIS — W19XXXA Unspecified fall, initial encounter: Secondary | ICD-10-CM | POA: Diagnosis not present

## 2019-09-07 DIAGNOSIS — Z79899 Other long term (current) drug therapy: Secondary | ICD-10-CM | POA: Insufficient documentation

## 2019-09-07 DIAGNOSIS — R410 Disorientation, unspecified: Secondary | ICD-10-CM | POA: Insufficient documentation

## 2019-09-07 DIAGNOSIS — J449 Chronic obstructive pulmonary disease, unspecified: Secondary | ICD-10-CM | POA: Diagnosis not present

## 2019-09-07 DIAGNOSIS — R253 Fasciculation: Secondary | ICD-10-CM | POA: Diagnosis not present

## 2019-09-07 DIAGNOSIS — K922 Gastrointestinal hemorrhage, unspecified: Secondary | ICD-10-CM | POA: Diagnosis not present

## 2019-09-07 DIAGNOSIS — H9190 Unspecified hearing loss, unspecified ear: Secondary | ICD-10-CM | POA: Insufficient documentation

## 2019-09-07 DIAGNOSIS — T39016A Underdosing of aspirin, initial encounter: Secondary | ICD-10-CM | POA: Insufficient documentation

## 2019-09-07 DIAGNOSIS — Z87891 Personal history of nicotine dependence: Secondary | ICD-10-CM | POA: Insufficient documentation

## 2019-09-07 DIAGNOSIS — D62 Acute posthemorrhagic anemia: Principal | ICD-10-CM | POA: Insufficient documentation

## 2019-09-07 DIAGNOSIS — I10 Essential (primary) hypertension: Secondary | ICD-10-CM | POA: Diagnosis not present

## 2019-09-07 DIAGNOSIS — Z743 Need for continuous supervision: Secondary | ICD-10-CM | POA: Diagnosis not present

## 2019-09-07 DIAGNOSIS — F419 Anxiety disorder, unspecified: Secondary | ICD-10-CM | POA: Insufficient documentation

## 2019-09-07 DIAGNOSIS — R079 Chest pain, unspecified: Secondary | ICD-10-CM | POA: Diagnosis not present

## 2019-09-07 DIAGNOSIS — E876 Hypokalemia: Secondary | ICD-10-CM

## 2019-09-07 DIAGNOSIS — I252 Old myocardial infarction: Secondary | ICD-10-CM | POA: Insufficient documentation

## 2019-09-07 DIAGNOSIS — R531 Weakness: Secondary | ICD-10-CM | POA: Diagnosis not present

## 2019-09-07 DIAGNOSIS — M160 Bilateral primary osteoarthritis of hip: Secondary | ICD-10-CM | POA: Diagnosis not present

## 2019-09-07 DIAGNOSIS — G894 Chronic pain syndrome: Secondary | ICD-10-CM | POA: Insufficient documentation

## 2019-09-07 DIAGNOSIS — C50411 Malignant neoplasm of upper-outer quadrant of right female breast: Secondary | ICD-10-CM | POA: Insufficient documentation

## 2019-09-07 DIAGNOSIS — K219 Gastro-esophageal reflux disease without esophagitis: Secondary | ICD-10-CM | POA: Diagnosis not present

## 2019-09-07 DIAGNOSIS — Z9981 Dependence on supplemental oxygen: Secondary | ICD-10-CM | POA: Insufficient documentation

## 2019-09-07 DIAGNOSIS — Z96611 Presence of right artificial shoulder joint: Secondary | ICD-10-CM | POA: Insufficient documentation

## 2019-09-07 DIAGNOSIS — I639 Cerebral infarction, unspecified: Secondary | ICD-10-CM | POA: Diagnosis not present

## 2019-09-07 DIAGNOSIS — Z9221 Personal history of antineoplastic chemotherapy: Secondary | ICD-10-CM | POA: Insufficient documentation

## 2019-09-07 DIAGNOSIS — Z66 Do not resuscitate: Secondary | ICD-10-CM | POA: Diagnosis not present

## 2019-09-07 DIAGNOSIS — Y92009 Unspecified place in unspecified non-institutional (private) residence as the place of occurrence of the external cause: Secondary | ICD-10-CM

## 2019-09-07 DIAGNOSIS — I2510 Atherosclerotic heart disease of native coronary artery without angina pectoris: Secondary | ICD-10-CM | POA: Insufficient documentation

## 2019-09-07 DIAGNOSIS — E782 Mixed hyperlipidemia: Secondary | ICD-10-CM | POA: Diagnosis present

## 2019-09-07 DIAGNOSIS — G4700 Insomnia, unspecified: Secondary | ICD-10-CM | POA: Insufficient documentation

## 2019-09-07 DIAGNOSIS — Z79890 Hormone replacement therapy (postmenopausal): Secondary | ICD-10-CM | POA: Insufficient documentation

## 2019-09-07 DIAGNOSIS — Z7902 Long term (current) use of antithrombotics/antiplatelets: Secondary | ICD-10-CM | POA: Insufficient documentation

## 2019-09-07 LAB — IRON AND TIBC
Iron: 46 ug/dL (ref 28–170)
Saturation Ratios: 15 % (ref 10.4–31.8)
TIBC: 316 ug/dL (ref 250–450)
UIBC: 270 ug/dL

## 2019-09-07 LAB — COMPREHENSIVE METABOLIC PANEL
ALT: 10 U/L (ref 0–44)
AST: 23 U/L (ref 15–41)
Albumin: 3.1 g/dL — ABNORMAL LOW (ref 3.5–5.0)
Alkaline Phosphatase: 29 U/L — ABNORMAL LOW (ref 38–126)
Anion gap: 7 (ref 5–15)
BUN: 13 mg/dL (ref 8–23)
CO2: 39 mmol/L — ABNORMAL HIGH (ref 22–32)
Calcium: 8.9 mg/dL (ref 8.9–10.3)
Chloride: 99 mmol/L (ref 98–111)
Creatinine, Ser: 0.64 mg/dL (ref 0.44–1.00)
GFR calc Af Amer: >60 mL/min (ref >60)
GFR calc non Af Amer: >60 mL/min (ref >60)
Glucose, Bld: 111 mg/dL — ABNORMAL HIGH (ref 70–99)
Potassium: 3.6 mmol/L (ref 3.5–5.1)
Sodium: 145 mmol/L (ref 135–145)
Total Bilirubin: 0.4 mg/dL (ref 0.3–1.2)
Total Protein: 5.8 g/dL — ABNORMAL LOW (ref 6.5–8.1)

## 2019-09-07 LAB — POC OCCULT BLOOD, ED: Fecal Occult Bld: NEGATIVE

## 2019-09-07 LAB — RETICULOCYTES
Immature Retic Fract: 13.8 % (ref 2.3–15.9)
RBC.: 2.52 MIL/uL — ABNORMAL LOW (ref 3.87–5.11)
Retic Count, Absolute: 38.3 10*3/uL (ref 19.0–186.0)
Retic Ct Pct: 1.5 % (ref 0.4–3.1)

## 2019-09-07 LAB — PROTIME-INR
INR: 1.1 (ref 0.8–1.2)
Prothrombin Time: 13.8 seconds (ref 11.4–15.2)

## 2019-09-07 LAB — URINALYSIS, ROUTINE W REFLEX MICROSCOPIC
Bilirubin Urine: NEGATIVE
Glucose, UA: NEGATIVE mg/dL
Hgb urine dipstick: NEGATIVE
Ketones, ur: NEGATIVE mg/dL
Leukocytes,Ua: NEGATIVE
Nitrite: NEGATIVE
Protein, ur: NEGATIVE mg/dL
Specific Gravity, Urine: 1.019 (ref 1.005–1.030)
pH: 5 (ref 5.0–8.0)

## 2019-09-07 LAB — CBC
HCT: 23.6 % — ABNORMAL LOW (ref 36.0–46.0)
Hemoglobin: 6.8 g/dL — CL (ref 12.0–15.0)
MCH: 30.4 pg (ref 26.0–34.0)
MCHC: 28.8 g/dL — ABNORMAL LOW (ref 30.0–36.0)
MCV: 105.4 fL — ABNORMAL HIGH (ref 80.0–100.0)
Platelets: 187 10*3/uL (ref 150–400)
RBC: 2.24 MIL/uL — ABNORMAL LOW (ref 3.87–5.11)
RDW: 13.7 % (ref 11.5–15.5)
WBC: 4.2 10*3/uL (ref 4.0–10.5)
nRBC: 0 % (ref 0.0–0.2)

## 2019-09-07 LAB — DIFFERENTIAL
Abs Immature Granulocytes: 0.01 10*3/uL (ref 0.00–0.07)
Basophils Absolute: 0 10*3/uL (ref 0.0–0.1)
Basophils Relative: 1 %
Eosinophils Absolute: 0.2 10*3/uL (ref 0.0–0.5)
Eosinophils Relative: 6 %
Immature Granulocytes: 0 %
Lymphocytes Relative: 27 %
Lymphs Abs: 1.1 10*3/uL (ref 0.7–4.0)
Monocytes Absolute: 0.4 10*3/uL (ref 0.1–1.0)
Monocytes Relative: 9 %
Neutro Abs: 2.4 10*3/uL (ref 1.7–7.7)
Neutrophils Relative %: 57 %

## 2019-09-07 LAB — RAPID URINE DRUG SCREEN, HOSP PERFORMED
Amphetamines: NOT DETECTED
Barbiturates: NOT DETECTED
Benzodiazepines: POSITIVE — AB
Cocaine: NOT DETECTED
Opiates: NOT DETECTED
Tetrahydrocannabinol: NOT DETECTED

## 2019-09-07 LAB — HEMOGLOBIN AND HEMATOCRIT, BLOOD
HCT: 23.6 % — ABNORMAL LOW (ref 36.0–46.0)
Hemoglobin: 7 g/dL — ABNORMAL LOW (ref 12.0–15.0)

## 2019-09-07 LAB — VITAMIN B12: Vitamin B-12: 336 pg/mL (ref 180–914)

## 2019-09-07 LAB — FERRITIN: Ferritin: 20 ng/mL (ref 11–307)

## 2019-09-07 LAB — ETHANOL: Alcohol, Ethyl (B): <10 mg/dL (ref <10)

## 2019-09-07 LAB — PREPARE RBC (CROSSMATCH)

## 2019-09-07 LAB — APTT: aPTT: 32 seconds (ref 24–36)

## 2019-09-07 LAB — FOLATE: Folate: 19.3 ng/mL (ref >5.9)

## 2019-09-07 LAB — TSH: TSH: 3.577 u[IU]/mL (ref 0.350–4.500)

## 2019-09-07 LAB — SARS CORONAVIRUS 2 (TAT 6-24 HRS): SARS Coronavirus 2: NEGATIVE

## 2019-09-07 MED ORDER — SODIUM CHLORIDE 0.9% IV SOLUTION: Freq: Once | INTRAVENOUS | Status: DC

## 2019-09-07 MED ORDER — DICLOFENAC SODIUM 1 % TD GEL
2.0000 g | Freq: Four times a day (QID) | TRANSDERMAL | Status: DC | PRN
  Filled 2019-09-07: qty 100

## 2019-09-07 MED ORDER — UMECLIDINIUM BROMIDE 62.5 MCG/INH IN AEPB
1.0000 | INHALATION_SPRAY | Freq: Every day | RESPIRATORY_TRACT | Status: DC
  Administered 2019-09-07 – 2019-09-09 (×3): 1 via RESPIRATORY_TRACT
  Filled 2019-09-07: qty 7

## 2019-09-07 MED ORDER — FERROUS SULFATE 325 (65 FE) MG PO TABS
325.0000 mg | ORAL_TABLET | Freq: Every day | ORAL | Status: DC
  Administered 2019-09-08 – 2019-09-09 (×2): 325 mg via ORAL
  Filled 2019-09-07 (×3): qty 1

## 2019-09-07 MED ORDER — ALPRAZOLAM 0.5 MG PO TABS
0.5000 mg | ORAL_TABLET | Freq: Three times a day (TID) | ORAL | Status: DC | PRN
  Administered 2019-09-07 – 2019-09-08 (×2): 0.5 mg via ORAL
  Filled 2019-09-07 (×2): qty 1

## 2019-09-07 MED ORDER — ACETAMINOPHEN 325 MG PO TABS
650.0000 mg | ORAL_TABLET | Freq: Four times a day (QID) | ORAL | Status: DC | PRN
  Administered 2019-09-07 – 2019-09-08 (×4): 650 mg via ORAL
  Filled 2019-09-07 (×4): qty 2

## 2019-09-07 MED ORDER — DULOXETINE HCL 60 MG PO CPEP
60.0000 mg | ORAL_CAPSULE | Freq: Every day | ORAL | Status: DC
  Administered 2019-09-07 – 2019-09-09 (×3): 60 mg via ORAL
  Filled 2019-09-07 (×3): qty 1

## 2019-09-07 MED ORDER — LEVOTHYROXINE SODIUM 112 MCG PO TABS
112.0000 ug | ORAL_TABLET | Freq: Every day | ORAL | Status: DC
  Administered 2019-09-07 – 2019-09-09 (×3): 112 ug via ORAL
  Filled 2019-09-07 (×3): qty 1

## 2019-09-07 MED ORDER — LOSARTAN POTASSIUM 25 MG PO TABS
12.5000 mg | ORAL_TABLET | Freq: Every day | ORAL | Status: DC
  Administered 2019-09-07 – 2019-09-09 (×3): 12.5 mg via ORAL
  Filled 2019-09-07 (×3): qty 1

## 2019-09-07 MED ORDER — TRAZODONE HCL 50 MG PO TABS
150.0000 mg | ORAL_TABLET | Freq: Every evening | ORAL | Status: DC | PRN
  Administered 2019-09-08: 150 mg via ORAL
  Filled 2019-09-07: qty 1

## 2019-09-07 MED ORDER — PANTOPRAZOLE SODIUM 40 MG PO TBEC
40.0000 mg | DELAYED_RELEASE_TABLET | Freq: Two times a day (BID) | ORAL | Status: DC
  Administered 2019-09-07 – 2019-09-09 (×5): 40 mg via ORAL
  Filled 2019-09-07 (×5): qty 1

## 2019-09-07 MED ORDER — SODIUM CHLORIDE 0.9 % IV SOLN
10.0000 mL/h | Freq: Once | INTRAVENOUS | Status: AC
  Administered 2019-09-07: 10 mL/h via INTRAVENOUS

## 2019-09-07 MED ORDER — ROSUVASTATIN CALCIUM 5 MG PO TABS
10.0000 mg | ORAL_TABLET | Freq: Every day | ORAL | Status: DC
  Administered 2019-09-07 – 2019-09-09 (×3): 10 mg via ORAL
  Filled 2019-09-07 (×3): qty 2

## 2019-09-07 MED ORDER — TIOTROPIUM BROMIDE MONOHYDRATE 18 MCG IN CAPS: 18.0000 ug | ORAL_CAPSULE | Freq: Every day | RESPIRATORY_TRACT | Status: DC

## 2019-09-07 MED ORDER — HYPROMELLOSE (GONIOSCOPIC) 2.5 % OP SOLN
1.0000 [drp] | Freq: Three times a day (TID) | OPHTHALMIC | Status: DC | PRN
  Filled 2019-09-07: qty 15

## 2019-09-07 MED ORDER — ACETAMINOPHEN 650 MG RE SUPP: 650.0000 mg | Freq: Four times a day (QID) | RECTAL | Status: DC | PRN

## 2019-09-07 MED ORDER — ANASTROZOLE 1 MG PO TABS
1.0000 mg | ORAL_TABLET | Freq: Every day | ORAL | Status: DC
  Administered 2019-09-07 – 2019-09-09 (×3): 1 mg via ORAL
  Filled 2019-09-07 (×3): qty 1

## 2019-09-07 MED ORDER — DICLOFENAC SODIUM 1 % EX GEL
2.0000 g | Freq: Four times a day (QID) | CUTANEOUS | Status: DC | PRN
  Filled 2019-09-07: qty 100

## 2019-09-07 MED ORDER — SODIUM CHLORIDE 0.9% FLUSH
3.0000 mL | Freq: Two times a day (BID) | INTRAVENOUS | Status: DC
  Administered 2019-09-07 – 2019-09-09 (×4): 3 mL via INTRAVENOUS

## 2019-09-07 MED ORDER — BISOPROLOL FUMARATE 5 MG PO TABS
2.5000 mg | ORAL_TABLET | Freq: Every day | ORAL | Status: DC
  Administered 2019-09-07 – 2019-09-09 (×3): 2.5 mg via ORAL
  Filled 2019-09-07 (×3): qty 1

## 2019-09-07 NOTE — Progress Notes (Signed)
EEG complete - results pending 

## 2019-09-07 NOTE — H&P (Signed)
History and Physical    [name] [id number] DOB: 11-Oct-1930 DOA: 09/07/2019  Referring MD/NP/PA: Shela Leff, MD PCP: Alice Ghent, MD  Patient coming from: Home via EMS  Chief Complaint: Fall  I have personally briefly reviewed patient's old medical records in [place]   HPI: [name] is a 86 y.o. female with medical history significant of CVA, COPD, oxygen dependent on 3.5 L, hypothyroidism, GERD, osteoporosis, and anxiety.  She presents after having a fall at home.  At baseline patient lives alone with her daughter coming to check on her at least 3 days out of the week, and utilizes a walker to ambulate.  For the last several weeks she notes that she has been having episodes of jerking of her hands and legs and whole body.  She reports difficulty eating due to her symptoms. Associated symptoms include complaints of dyspnea on exertion and generalized weakness. Her daughter provides additional information and reports a decline in her health over the last 2 months.  Apparently she has been unable to walk or get around like she previously did.  Furthermore over the last 2 to 3 days her speech seemed more slurred, she was pale in color, complained of a headache, and they had noticed some right-sided facial drooping.  Patient also noted that she has had dark stools, but is not iron and has not noticed any gross blood.  She has never required transfusion of blood before and would not want to undergo colonoscopy at this time.  She does not know when her last colonoscopy was.  ED Course: Upon admission into the emergency department patient was noted to be afebrile, respiration 14-23, blood pressure elevated up to 162/71, blood pressures maintained, and O2 saturations maintained on 4 L nasal cannula oxygen.  Labs significant for hemoglobin 6.8 with MCV 105.4 and CO2 39.  Stool guaiacs were negative.  Urinalysis was negative for any acute abnormalities.  X-rays  of the chest and pelvis did not note any acute abnormalities. Covid-19 negative. CT scan of the brain noted chronic atrophy and ischemic changes without any acute abnormalities.  Patient was typed and screened and ordered 1 unit of packed red blood cells.  TRH called to admit, and was accepted to a MedSurg bed as observation.  Review of Systems  Constitutional: Positive for chills and malaise/fatigue. Negative for fever.  HENT: Positive for hearing loss. Negative for ear discharge and ear pain.   Eyes: Negative for pain.  Respiratory: Positive for cough and shortness of breath. Negative for sputum production.   Cardiovascular: Negative for chest pain and leg swelling.  Gastrointestinal: Negative for abdominal pain, blood in stool, nausea and vomiting.  Genitourinary: Negative for dysuria and hematuria.  Musculoskeletal: Positive for falls and joint pain.  Skin: Negative for itching.  Neurological: Positive for tremors, weakness and headaches.  Psychiatric/Behavioral: Negative for suicidal ideas. The patient does not have insomnia.     Past Medical History:  Diagnosis Date  . Allergic rhinitis, cause unspecified   . Allergy, unspecified not elsewhere classified   . Anxiety   . Asthma   . Chronic kidney disease    frequency  . COPD (chronic obstructive pulmonary disease) ([place])   . Degeneration of intervertebral disc, site unspecified   . Diverticulosis of colon (without mention of hemorrhage)   . Esophageal reflux   . Headache   . Insomnia, unspecified   . Myalgia and myositis, unspecified   . Need for prophylactic hormone replacement therapy (  postmenopausal)   . Osteoarthritis of right knee 08/27/2011   R knee pain, s/p arthroscopy 2012 per [name]   . Osteoporosis, unspecified   . Other abnormal blood chemistry   . Other and unspecified hyperlipidemia   . Other chronic pain   . Stroke [facility]    mini strokes  . Unspecified essential hypertension   . Unspecified  hypothyroidism     Past Surgical History:  Procedure Laterality Date  . BACK SURGERY    . breast cystectomy    . CORONARY/GRAFT ACUTE MI REVASCULARIZATION N/A 01/29/2019   Procedure: CORONARY/GRAFT ACUTE MI REVASCULARIZATION;  Surgeon: Belva Crome, MD;  Location: [facility];  Service: Cardiovascular;  Laterality: N/A;  . JOINT REPLACEMENT     shoulder right  . KNEE ARTHROSCOPY     Right knee 2012  . MASS EXCISION Left 04/07/2015   Procedure: MINOR EXCISION OF MASS LEFT SMALL FINGER;  Surgeon: Leanora Cover, MD;  Location: [place];  Service: Orthopedics;  Laterality: Left;  . SHOULDER SURGERY    . TOTAL KNEE ARTHROPLASTY  12/11/2011   Procedure: TOTAL KNEE ARTHROPLASTY;  Surgeon: Johnny Bridge, MD;  Location: [place];  Service: Orthopedics;  Laterality: Right;  . UMBILICAL HERNIA REPAIR N/A 06/09/2013   Procedure: EXPLORATIORY LAPAROTOMY, HERNIA REPAIR UMBILICAL, INSERTION OF MESH;  Surgeon: Haywood Lasso, MD;  Location: [place];  Service: General;  Laterality: N/A;     reports that she quit smoking about 34 years ago. Her smoking use included cigarettes. She has a 15.00 pack-year smoking history. She has never used smokeless tobacco. She reports that she does not drink alcohol or use drugs.  Allergies  Allergen Reactions  . Doxycycline Other (See Comments)    GI upset  . Sulfadiazine Other (See Comments)    REACTION: Fever, aches  . Sulfa Antibiotics Rash    Family History  Problem Relation Age of Onset  . Lung cancer Brother        x2  . Hypertension Father   . Heart disease Father   . Heart disease Mother   . Asthma Sister     Prior to Admission medications   Medication Sig Start Date End Date Taking? Authorizing Provider  alendronate (FOSAMAX) 70 MG tablet Take 1 tablet (70 mg total) by mouth once a week. Take with a full glass of water on an empty stomach. 03/10/19   Magrinat, Virgie Dad, MD  ALPRAZolam (XANAX) 1 MG tablet TAKE 1/2 TABLET BY  MOUTH 3 TIMES DAILY AS NEEDED FOR ANXIETY. 08/25/19   Alice Ghent, MD  anastrozole (ARIMIDEX) 1 MG tablet Take 1 mg by mouth daily.    [provider]  bisoprolol (ZEBETA) 5 MG tablet Take 0.5 tablets (2.5 mg total) by mouth daily. 01/31/19   Belva Crome, MD  Calcium Carbonate (CALTRATE 600 PO) Take 600 mg by mouth daily.     [provider]  cholecalciferol (VITAMIN D) 1000 units tablet Take 1 tablet (1,000 Units total) by mouth daily. 09/25/16   Alice Ghent, MD  diclofenac sodium (VOLTAREN) 1 % GEL APPLY 4 GRAMS TO AFFECTED AREA 2 TIMES A DAY. 08/26/17   Alice Ghent, MD  DULoxetine (CYMBALTA) 60 MG capsule TAKE 1 CAPSULE BY MOUTH DAILY 05/07/19   Alice Ghent, MD  ferrous sulfate (FERROUSUL) 325 (65 FE) MG tablet Take 1 tablet (325 mg total) by mouth daily with breakfast. 08/22/18   Alice Ghent, MD  gabapentin (NEURONTIN) 300  MG capsule 2 tabs in the AM and 1 tab in the afternoon 12/30/18   Alice Ghent, MD  hydroxypropyl methylcellulose / hypromellose (ISOPTO TEARS / GONIOVISC) 2.5 % ophthalmic solution Place 1 drop into both eyes 3 (three) times daily as needed for dry eyes.    [provider]  levothyroxine (SYNTHROID) 112 MCG tablet TAKE 1 TABLET BY MOUTH DAILY BEFORE BREAKFAST. 04/10/19   Alice Ghent, MD  losartan (COZAAR) 25 MG tablet Take 0.5 tablets (12.5 mg total) by mouth daily. 02/01/19   Almyra Deforest, PA  Multiple Vitamin (MULTIVITAMIN WITH MINERALS) TABS tablet Take 1 tablet by mouth daily.    [provider]  Multiple Vitamins-Minerals (OCUVITE PRESERVISION PO) Take 1 tablet by mouth daily.    [provider]  nitroGLYCERIN (NITROSTAT) 0.4 MG SL tablet Place 1 tablet (0.4 mg total) under the tongue every 5 (five) minutes as needed for chest pain. 01/30/19   Belva Crome, MD  omeprazole (PRILOSEC) 20 MG capsule TAKE 1 CAPSULE BY MOUTH 2 TIMES DAILY BEFORE A MEAL. 06/22/19   Alice Ghent, MD  OXYGEN Inhale 3-3.5  L into the lungs continuous.    [provider]  predniSONE (DELTASONE) 10 MG tablet Take 1 tablet (10 mg total) by mouth daily with breakfast. 07/23/19   Alice Ghent, MD  PROAIR HFA 108 [phone] Base) MCG/ACT inhaler INHALE 1 TO 2 PUFFS INTO THE LUNGS EVERY 6 HOURS AS NEEDED FOR WHEEZING OR SHORTNESS OF BREATH. 04/10/19   Alice Ghent, MD  rosuvastatin (CRESTOR) 10 MG tablet Take 1 tablet (10 mg total) by mouth daily. 05/13/19   Alice Ghent, MD  ticagrelor (BRILINTA) 90 MG TABS tablet Take 1 tablet (90 mg total) by mouth 2 (two) times daily. 02/01/19   Almyra Deforest, PA  tiotropium (SPIRIVA HANDIHALER) 18 MCG inhalation capsule PLACE 1 CAPSULE INTO INHALER AND INHALE ONCE A DAY 02/16/19   Alice Ghent, MD  traZODone (DESYREL) 50 MG tablet Take 3 tablets (150 mg total) by mouth at bedtime as needed for sleep. 01/27/19   Alice Ghent, MD    Physical Exam:  Constitutional: Elderly female who appears to be in no acute distress, but did spill coffee and food on self Vitals:   09/07/19 0530 09/07/19 0548 09/07/19 0600 09/07/19 0658  BP: (!) 161/71 (!) 162/71 94/72 (!) 151/54  Pulse: 71 69 81 70  Resp: 14  20 17   Temp:  98.7 F (37.1 C)  98.7 F (37.1 C)  TempSrc:  Oral  Oral  SpO2: 100% 100% 92% 100%  Weight:      Height:       Eyes: PERRL, lids and conjunctivae normal ENMT: Mucous membranes are moist. Posterior pharynx clear of any exudate or lesions.  Neck: normal, supple, no masses, no thyromegaly Respiratory: clear to auscultation bilaterally, no wheezing, no crackles. Normal respiratory effort. No accessory muscle use.  Cardiovascular: Regular rate and rhythm, no murmurs / rubs / gallops. No extremity edema. 2+ pedal pulses. No carotid bruits.  Abdomen: no tenderness, no masses palpated. No hepatosplenomegaly. Bowel sounds positive.  Musculoskeletal: no clubbing / cyanosis. No joint deformity upper and lower extremities. Good ROM, no contractures. Normal muscle tone.    Skin: Pallor present.  No rashes, lesions, ulcers. No induration Neurologic: CN 2-12 grossly intact. Sensation intact, DTR normal. Strength appears equal in all 4 extremities.  Intermittent jerking noted of the upper extremity while trying to drink. Psychiatric: Normal judgment and insight.  Alert and oriented x 3. Normal mood.     Labs on Admission: I have personally reviewed following labs and imaging studies  CBC: Recent Labs  Lab 09/07/19 0310  WBC 4.2  NEUTROABS 2.4  HGB 6.8*  HCT 23.6*  MCV 105.4*  PLT 123XX123   Basic Metabolic Panel: Recent Labs  Lab 09/07/19 0310  NA 145  K 3.6  CL 99  CO2 39*  GLUCOSE 111*  BUN 13  CREATININE 0.64  CALCIUM 8.9   GFR: Estimated Creatinine Clearance: 42.6 mL/min (by C-G formula based on SCr of 0.64 mg/dL). Liver Function Tests: Recent Labs  Lab 09/07/19 0310  AST 23  ALT 10  ALKPHOS 29*  BILITOT 0.4  PROT 5.8*  ALBUMIN 3.1*   No results for input(s): LIPASE, AMYLASE in the last 168 hours. No results for input(s): AMMONIA in the last 168 hours. Coagulation Profile: Recent Labs  Lab 09/07/19 0310  INR 1.1   Cardiac Enzymes: No results for input(s): CKTOTAL, CKMB, CKMBINDEX, TROPONINI in the last 168 hours. BNP (last 3 results) Recent Labs    03/27/19 1140  PROBNP 461.0*   HbA1C: No results for input(s): HGBA1C in the last 72 hours. CBG: No results for input(s): GLUCAP in the last 168 hours. Lipid Profile: No results for input(s): CHOL, HDL, LDLCALC, TRIG, CHOLHDL, LDLDIRECT in the last 72 hours. Thyroid Function Tests: No results for input(s): TSH, T4TOTAL, FREET4, T3FREE, THYROIDAB in the last 72 hours. Anemia Panel: No results for input(s): VITAMINB12, FOLATE, FERRITIN, TIBC, IRON, RETICCTPCT in the last 72 hours. Urine analysis:    Component Value Date/Time   COLORURINE YELLOW 09/07/2019 0520   APPEARANCEUR CLEAR 09/07/2019 0520   LABSPEC 1.019 09/07/2019 0520   PHURINE 5.0 09/07/2019 0520   GLUCOSEU  NEGATIVE 09/07/2019 0520   GLUCOSEU NEGATIVE 10/24/2016 1429   HGBUR NEGATIVE 09/07/2019 0520   BILIRUBINUR NEGATIVE 09/07/2019 0520   BILIRUBINUR negative 09/20/2016 1625   KETONESUR NEGATIVE 09/07/2019 0520   PROTEINUR NEGATIVE 09/07/2019 0520   UROBILINOGEN 0.2 10/24/2016 1429   NITRITE NEGATIVE 09/07/2019 0520   LEUKOCYTESUR NEGATIVE 09/07/2019 0520   Sepsis Labs: No results found for this or any previous visit (from the past 240 hour(s)).   Radiological Exams on Admission: CT HEAD WO CONTRAST  Result Date: 09/07/2019 CLINICAL DATA:  Recent fall with altered mental status EXAM: CT HEAD WITHOUT CONTRAST TECHNIQUE: Contiguous axial images were obtained from the base of the skull through the vertex without intravenous contrast. COMPARISON:  01/23/2017 FINDINGS: Brain: Chronic atrophic and ischemic changes are again identified and stable. No findings to suggest acute hemorrhage, acute infarction or space-occupying mass lesion is noted. Vascular: No hyperdense vessel or unexpected calcification. Skull: Normal. Negative for fracture or focal lesion. Sinuses/Orbits: Mild mucosal thickening is noted within the ethmoid, frontal right maxillary sinuses. Other: Bilateral subdural hygromas are stable in appearance from exam. IMPRESSION: Chronic atrophic and ischemic changes without acute abnormality. Electronically Signed   By: Inez Catalina M.D.   On: 09/07/2019 03:17   DG Pelvis Portable  Result Date: 09/07/2019 CLINICAL DATA:  Left hip pain EXAM: PORTABLE PELVIS 1-2 VIEWS COMPARISON:  None. FINDINGS: There is no evidence of pelvic fracture or diastasis. There is diffuse osteopenia. The sacroiliac joints appear to be intact. Degenerative changes in the lower lumbar spine. Moderate bilateral hip osteoarthritis. IMPRESSION: No definite acute fracture.  Moderate bilateral hip osteoarthritis. Electronically Signed   By: Prudencio Pair M.D.   On: 09/07/2019 05:16   DG Chest Port 1  View  Result Date:  09/07/2019 CLINICAL DATA:  Chest pain, history of recent fall EXAM: PORTABLE CHEST 1 VIEW COMPARISON:  12/27/2018 FINDINGS: Cardiac shadow is within normal limits. Tortuosity of the thoracic aorta is noted accentuated by patient rotation to the right. No focal infiltrate or sizable effusion is seen. Postsurgical changes in the right shoulder are noted. No acute bony abnormality is seen. IMPRESSION: No acute abnormality noted. Electronically Signed   By: Inez Catalina M.D.   On: 09/07/2019 02:50    EKG: Independently reviewed.  Sinus rhythm at 74 bpm with a prolonged PR interval.   Assessment/Plan Macrocytic anemia: Acute on chronic.  Patient presents with hemoglobin of 6.8 with elevated MCV of 105.4.  Vitamin B12 level was 383 and folate 26.5 in 04/2019.  Stool guaiac was noted to be negative.  Patient had been ordered to be transfused 1 unit of packed red blood cells. -Admit to a telemetry bed -Check anemia panel  -Recheck H&H  -Transfuse blood products as needed -Consider formally consulting GI hemoglobin noted to drop  Jerking and weakness: Acute.  Patient reports over the last several weeks that she has had increasing jerking and weakness.  Daughter also noted recent slurred speech and signs of right-sided facial droop over the last 2 to 3 days. -Neurochecks -Check MRI of brain w/o contrast -Check EEG  Fall: Normally ambulates with use of rolling walker.  Denies any complaints of pain. -PT/OT to eval and treat   COPD, oxygen dependent: Patient on 3.5 L of nasal cannula oxygen at baseline.  She reports some shortness of breath, but significant reports of wheezing.  Chest x-ray otherwise noted to be clear. -Continuous pulse oximetry with nasal cannula oxygen to maintain O2 saturations greater than 90%. -Continue Spiriva -Albuterol nebs as needed for shortness of breath  History of CVA: Patient on Brilinta in the outpatient setting. -Continue Brilinta  Essential Hypertension: Home blood  pressure medications include bisoprolol 2.5 mg daily and losartan 12.5 mg daily. -Continue home blood pressure medication  Hypothyroidism: Last TSH noted to be 0.38 in 03/2018. -Check TSH -Continue levothyroxine  History of breast cancer: Patient with previous history of breast cancer for which she had resection and chemotherapy. -Continue Anastrozole  Hyperlipidemia  -Continue Crestor  Gerd: Home medications include omeprazole 20 mg bid. -Pharmacy substitution Protonix 40 mg BID    DVT prophylaxis: SCDs Code Status: Full  Family Communication: Daughter/POA updated over the phone Disposition Plan: Likely discharge home in a.m. if medically stable Consults called: PT/OT Admission status: Observation  Norval Morton MD [facility] Pager [phone]   If 7PM-7AM, please contact night-coverage [email or web address] Password [facility]  09/07/2019, 7:56 AM

## 2019-09-07 NOTE — Progress Notes (Signed)
PT Cancellation Note  Patient Details Name: [name] MRN: [id number] DOB: 07/19/1930   Cancelled Treatment:    Reason Eval/Treat Not Completed: Patient at procedure or test/unavailable Pt currently having EEG. Will follow up as schedule allows.   Lou Miner, DPT  Acute Rehabilitation Services  Pager: [phone] Office: [phone]    Rudean Hitt 09/07/2019, 3:35 PM

## 2019-09-07 NOTE — Telephone Encounter (Signed)
Patient's daughter [name] left a voicemail stating her mom went to the hospital last night and she was very anemic. Patient's daughter wants to know if Dr. Damita Dunnings recommends further testing?

## 2019-09-07 NOTE — Telephone Encounter (Signed)
I saw the inpatient notes and I agree with their plan.  I will await the rest of the inpatient notes and I appreciate the call.  Please give her my regards.  Thanks.

## 2019-09-07 NOTE — Telephone Encounter (Signed)
Daughter advised.

## 2019-09-07 NOTE — Progress Notes (Signed)
CRITICAL VALUE STICKER  CRITICAL VALUE: Hemoglobin 7   DATE & TIME NOTIFIED: 2:57 PM   MD NOTIFIED: Dr. Fuller Plan  TIME OF NOTIFICATION: 2:57 PM  RN Paged MD

## 2019-09-07 NOTE — ED Provider Notes (Signed)
[facility] EMERGENCY DEPARTMENT Provider Note   CSN: [id number] Arrival date & time: 09/07/19  0209     History Chief Complaint  Patient presents with  . Fall    [name] is a 86 y.o. female.  The history is provided by the patient and a relative.  Fall This is a new problem. The problem has not changed since onset.Associated symptoms include headaches. Pertinent negatives include no chest pain and no abdominal pain. The symptoms are aggravated by walking. Nothing relieves the symptoms.   Patient with history of COPD, on oxygen at home, breast cancer, previous strokes presents for generalized weakness and falls.  Family reports the patient has had increasing levels of confusion and what appears to be body tremors.  Tonight while trying to ambulate she did fall, but no LOC.  No traumatic injuries are reported     Past Medical History:  Diagnosis Date  . Allergic rhinitis, cause unspecified   . Allergy, unspecified not elsewhere classified   . Anxiety   . Asthma   . Chronic kidney disease    frequency  . COPD (chronic obstructive pulmonary disease) ([place])   . Degeneration of intervertebral disc, site unspecified   . Diverticulosis of colon (without mention of hemorrhage)   . Esophageal reflux   . Headache   . Insomnia, unspecified   . Myalgia and myositis, unspecified   . Need for prophylactic hormone replacement therapy (postmenopausal)   . Osteoarthritis of right knee 08/27/2011   R knee pain, s/p arthroscopy 2012 per [name]   . Osteoporosis, unspecified   . Other abnormal blood chemistry   . Other and unspecified hyperlipidemia   . Other chronic pain   . Stroke [facility])    mini strokes  . Unspecified essential hypertension   . Unspecified hypothyroidism     Patient Active Problem List   Diagnosis Date Noted  . Pelvic pain 03/25/2019  . Dysphagia 02/18/2019  . Coronary artery disease involving native coronary artery of native  heart with unstable angina pectoris ([place]) 01/30/2019  . Presence of drug coated stent in right coronary artery 01/30/2019  . Left main coronary artery disease - ~50-60% dLM (&70% dLAD) -- Plan Med Rx 01/30/2019  . Ischemic cardiomyopathy 01/30/2019  . Acute ST elevation myocardial infarction (STEMI) of inferior wall ([place]) 01/29/2019  . AAA (abdominal aortic aneurysm) ([place]) 12/31/2018  . Malignant neoplasm of upper-outer quadrant of right breast in female, estrogen receptor positive ([place]) 09/26/2018  . Breast mass 08/28/2018  . Bilateral impacted cerumen 08/14/2017  . Laryngopharyngeal reflux (LPR) 08/14/2017  . Presbycusis of both ears 08/14/2017  . Thoracic aortic aneurysm ([place]) 04/10/2017  . Abdominal pain 04/05/2017  . Syncope 01/24/2017  . Low back pain 01/01/2017  . Medicare annual wellness visit, subsequent 09/26/2016  . Hard of hearing 09/26/2016  . Vitamin D deficiency 09/26/2016  . Advance care planning 09/26/2016  . Encounter for chronic pain management 07/26/2016  . Edema 10/21/2014  . Multinodular thyroid 07/07/2013  . HTN (hypertension) 06/09/2013  . Chronic respiratory failure ([place]) 05/13/2013  . Cough 05/02/2013  . Vertigo 12/31/2012  . Osteoarthritis of right knee 08/27/2011  . Gold C Copd with asthmatic bronchitis component and chronic resp failure 06/26/2011  . Neck pain 04/02/2011  . RISK OF FALLING 08/17/2010  . Hypokalemia 05/24/2009  . Anemia, unspecified 04/10/2008  . Anxiety state 03/01/2008  . Osteoporosis 06/23/2007  . Chronic pain syndrome 12/26/2006  . Hypothyroidism 12/24/2006  . Hyperlipidemia with target LDL  less than 70 12/24/2006  . COMMON MIGRAINE 12/24/2006  . GERD 12/24/2006  . DEGENERATIVE DISC DISEASE 12/24/2006  . Fibromyalgia 12/24/2006  . Insomnia 12/24/2006  . HYPERGLYCEMIA 12/24/2006    Past Surgical History:  Procedure Laterality Date  . BACK SURGERY    . breast cystectomy    . CORONARY/GRAFT ACUTE MI REVASCULARIZATION N/A  01/29/2019   Procedure: CORONARY/GRAFT ACUTE MI REVASCULARIZATION;  Surgeon: Belva Crome, MD;  Location: [place] [facility];  Service: Cardiovascular;  Laterality: N/A;  . JOINT REPLACEMENT     shoulder right  . KNEE ARTHROSCOPY     Right knee 2012  . MASS EXCISION Left 04/07/2015   Procedure: MINOR EXCISION OF MASS LEFT SMALL FINGER;  Surgeon: Leanora Cover, MD;  Location: [place];  Service: Orthopedics;  Laterality: Left;  . SHOULDER SURGERY    . TOTAL KNEE ARTHROPLASTY  12/11/2011   Procedure: TOTAL KNEE ARTHROPLASTY;  Surgeon: Johnny Bridge, MD;  Location: [place];  Service: Orthopedics;  Laterality: Right;  . UMBILICAL HERNIA REPAIR N/A 06/09/2013   Procedure: EXPLORATIORY LAPAROTOMY, HERNIA REPAIR UMBILICAL, INSERTION OF MESH;  Surgeon: Haywood Lasso, MD;  Location: [place];  Service: General;  Laterality: N/A;     OB History   No obstetric history on file.     Family History  Problem Relation Age of Onset  . Lung cancer Brother        x2  . Hypertension Father   . Heart disease Father   . Heart disease Mother   . Asthma Sister     Social History   Tobacco Use  . Smoking status: Former Smoker    Packs/day: 1.50    Years: 10.00    Pack years: 15.00    Types: Cigarettes    Quit date: 07/09/1985    Years since quitting: 34.1  . Smokeless tobacco: Never Used  . Tobacco comment: 1 1/2 ppd x 20 years  Substance Use Topics  . Alcohol use: No    Alcohol/week: 0.0 standard drinks  . Drug use: No    Home Medications Prior to Admission medications   Medication Sig Start Date End Date Taking? Authorizing Provider  alendronate (FOSAMAX) 70 MG tablet Take 1 tablet (70 mg total) by mouth once a week. Take with a full glass of water on an empty stomach. 03/10/19   Magrinat, Virgie Dad, MD  ALPRAZolam (XANAX) 1 MG tablet TAKE 1/2 TABLET BY MOUTH 3 TIMES DAILY AS NEEDED FOR ANXIETY. 08/25/19   Tonia Ghent, MD  anastrozole (ARIMIDEX) 1 MG tablet Take 1 mg by  mouth daily.    [provider]  bisoprolol (ZEBETA) 5 MG tablet Take 0.5 tablets (2.5 mg total) by mouth daily. 01/31/19   Belva Crome, MD  Calcium Carbonate (CALTRATE 600 PO) Take 600 mg by mouth daily.     [provider]  cholecalciferol (VITAMIN D) 1000 units tablet Take 1 tablet (1,000 Units total) by mouth daily. 09/25/16   Tonia Ghent, MD  diclofenac sodium (VOLTAREN) 1 % GEL APPLY 4 GRAMS TO AFFECTED AREA 2 TIMES A DAY. 08/26/17   Tonia Ghent, MD  DULoxetine (CYMBALTA) 60 MG capsule TAKE 1 CAPSULE BY MOUTH DAILY 05/07/19   Tonia Ghent, MD  ferrous sulfate (FERROUSUL) 325 (65 FE) MG tablet Take 1 tablet (325 mg total) by mouth daily with breakfast. 08/22/18   Tonia Ghent, MD  gabapentin (NEURONTIN) 300 MG capsule 2 tabs in the AM  and 1 tab in the afternoon 12/30/18   Tonia Ghent, MD  hydroxypropyl methylcellulose / hypromellose (ISOPTO TEARS / GONIOVISC) 2.5 % ophthalmic solution Place 1 drop into both eyes 3 (three) times daily as needed for dry eyes.    [provider]  levothyroxine (SYNTHROID) 112 MCG tablet TAKE 1 TABLET BY MOUTH DAILY BEFORE BREAKFAST. 04/10/19   Tonia Ghent, MD  losartan (COZAAR) 25 MG tablet Take 0.5 tablets (12.5 mg total) by mouth daily. 02/01/19   Almyra Deforest, PA  Multiple Vitamin (MULTIVITAMIN WITH MINERALS) TABS tablet Take 1 tablet by mouth daily.    [provider]  Multiple Vitamins-Minerals (OCUVITE PRESERVISION PO) Take 1 tablet by mouth daily.    [provider]  nitroGLYCERIN (NITROSTAT) 0.4 MG SL tablet Place 1 tablet (0.4 mg total) under the tongue every 5 (five) minutes as needed for chest pain. 01/30/19   Belva Crome, MD  omeprazole (PRILOSEC) 20 MG capsule TAKE 1 CAPSULE BY MOUTH 2 TIMES DAILY BEFORE A MEAL. 06/22/19   Tonia Ghent, MD  OXYGEN Inhale 3-3.5 L into the lungs continuous.    [provider]  predniSONE (DELTASONE) 10 MG tablet Take 1 tablet (10 mg total)  by mouth daily with breakfast. 07/23/19   Tonia Ghent, MD  PROAIR HFA [phone] Base) MCG/ACT inhaler INHALE 1 TO 2 PUFFS INTO THE LUNGS EVERY 6 HOURS AS NEEDED FOR WHEEZING OR SHORTNESS OF BREATH. 04/10/19   Tonia Ghent, MD  rosuvastatin (CRESTOR) 10 MG tablet Take 1 tablet (10 mg total) by mouth daily. 05/13/19   Tonia Ghent, MD  ticagrelor (BRILINTA) 90 MG TABS tablet Take 1 tablet (90 mg total) by mouth 2 (two) times daily. 02/01/19   Almyra Deforest, PA  tiotropium (SPIRIVA HANDIHALER) 18 MCG inhalation capsule PLACE 1 CAPSULE INTO INHALER AND INHALE ONCE A DAY 02/16/19   Tonia Ghent, MD  traZODone (DESYREL) 50 MG tablet Take 3 tablets (150 mg total) by mouth at bedtime as needed for sleep. 01/27/19   Tonia Ghent, MD    Allergies    Doxycycline, Sulfadiazine, and Sulfa antibiotics  Review of Systems   Review of Systems  Constitutional: Positive for fatigue. Negative for fever.  Cardiovascular: Negative for chest pain.  Gastrointestinal: Negative for abdominal pain and vomiting.  Musculoskeletal: Negative for back pain.  Neurological: Positive for weakness and headaches.  All other systems reviewed and are negative.   Physical Exam Updated Vital Signs BP (!) 131/97 (BP Location: Right Arm)   Pulse 69   Temp 98.1 F (36.7 C) (Oral)   Resp 17   Ht 1.549 m (5\' 1" )   Wt 62.1 kg   SpO2 100%   BMI 25.89 kg/m   Physical Exam CONSTITUTIONAL: elderly, frail HEAD: Normocephalic/atraumatic EYES: EOMI/PERRL ENMT: Mucous membranes moist NECK: supple no meningeal signs SPINE/BACK:entire spine nontender, kyphotic spine CV: S1/S2 noted, no murmurs/rubs/gallops noted LUNGS: Lungs are clear to auscultation bilaterally, no apparent distress ABDOMEN: soft, nontender, no rebound or guarding, bowel sounds noted throughout abdomen GU:no cva tenderness Rectal-no blood or melena in the stool, nurse Garlon Hatchet present NEURO: Pt is awake/alert/appropriate, moves all extremitiesx4.  No  facial droop.  GCS 15 EXTREMITIES: pulses normal/equal, full ROM, pelvis stable, All other extremities/joints palpated/ranged and nontender SKIN: warm, pale PSYCH: no abnormalities of mood noted, alert and oriented to situation  ED Results / Procedures / Treatments   Labs (all labs ordered are listed, but only abnormal results are displayed)  Labs Reviewed  CBC - Abnormal; Notable for the following components:      Result Value   RBC 2.24 (*)    Hemoglobin 6.8 (*)    HCT 23.6 (*)    MCV 105.4 (*)    MCHC 28.8 (*)    All other components within normal limits  COMPREHENSIVE METABOLIC PANEL - Abnormal; Notable for the following components:   CO2 39 (*)    Glucose, Bld 111 (*)    Total Protein 5.8 (*)    Albumin 3.1 (*)    Alkaline Phosphatase 29 (*)    All other components within normal limits  SARS CORONAVIRUS 2 (TAT 6-24 HRS)  ETHANOL  PROTIME-INR  APTT  DIFFERENTIAL  RAPID URINE DRUG SCREEN, HOSP PERFORMED  URINALYSIS, ROUTINE W REFLEX MICROSCOPIC  POC OCCULT BLOOD, ED  TYPE AND SCREEN  PREPARE RBC (CROSSMATCH)    EKG EKG Interpretation  Date/Time:  Monday September 07 2019 02:18:27 EST Ventricular Rate:  74 PR Interval:    QRS Duration: 86 QT Interval:  436 QTC Calculation: 484 R Axis:   20 Text Interpretation: Sinus rhythm Low voltage, extremity leads Consider anterior infarct Nonspecific T abnormalities, lateral leads Confirmed by Ripley Fraise [phone]) on 09/07/2019 2:21:43 AM   Radiology DG Chest Port 1 View  Result Date: 09/07/2019 CLINICAL DATA:  Chest pain, history of recent fall EXAM: PORTABLE CHEST 1 VIEW COMPARISON:  12/27/2018 FINDINGS: Cardiac shadow is within normal limits. Tortuosity of the thoracic aorta is noted accentuated by patient rotation to the right. No focal infiltrate or sizable effusion is seen. Postsurgical changes in the right shoulder are noted. No acute bony abnormality is seen. IMPRESSION: No acute abnormality noted. Electronically Signed    By: Inez Catalina M.D.   On: 09/07/2019 02:50    Procedures Procedures  Medications Ordered in ED Medications - No data to display  ED Course  I have reviewed the triage vital signs and the nursing notes.  Pertinent labs & imaging results that were available during my care of the patient were reviewed by me and considered in my medical decision making (see chart for details).    MDM Rules/Calculators/A&P                      2:59 AM Patient presents from home after a fall.  She has had increasing generalized weakness recently fatigue.  She is also had intermittent episodes of tremors and also right-sided headache prior to the fall No signs of any traumatic injury to extremities.  However will obtain CT head.  Chest x-ray is already reviewed is negative Labs are pending at this time. Patient is mildly drowsy but answers all questions appropriately.  Question if there is some overmedication with Xanax. Discussed the case with  the daughters via phone 5:02 AM Patient found to have acute anemia.  Hemoccult negative, unclear cause of blood loss. Rest of the labs are overall reassuring.  No signs of traumatic head injury.  Patient denied any extremity pain complaints, but daughters keep reporting patient was having hip pain. No signs of any traumatic injury to hip.  Will obtain screening pelvic x-ray. Discussed with daughters, they are in agreement for blood transfusion.  Patient will also need work-up for anemia.  Discussed with Dr. Marlowe Sax for admission.  1 unit of blood has been ordered Final Clinical Impression(s) / ED Diagnoses Final diagnoses:  Acute anemia  Fall, initial encounter    Rx / DC Orders ED Discharge Orders  None       Ripley Fraise, MD 09/07/19 [phone]

## 2019-09-07 NOTE — Care Management Obs Status (Signed)
[facility] NOTIFICATION   Patient Details  Name: Kandis Mccraney MRN: [id number] Date of Birth: 26-Jan-1931   Medicare Observation Status Notification Given:  Yes    Marilu Favre, RN 09/07/2019, 2:47 PM

## 2019-09-07 NOTE — ED Triage Notes (Signed)
Pt arrives by gc cems from home, reportly had a fall earlier today  (pt denies), denies head strike or loss of consciousness. Family reports increasing weakness and tremors over the last week. Pt hx of MI, TIA, COPD, fibromyalgia, and breast cancer with possible mets. Pt reports ca tx with PO pills. Pt denies any pain other than her chronic pain.

## 2019-09-07 NOTE — ED Notes (Signed)
Pt to CT

## 2019-09-07 NOTE — TOC Initial Note (Signed)
Transition of Care [facility]) - Initial/Assessment Note    Patient Details  Name: [name] MRN: [id number] Date of Birth: 06/11/1931  [facility] CM/SW Contact:    Marilu Favre, RN Phone Number: 09/07/2019, 2:48 PM  Clinical Narrative:                 Patient  from home alone, however, daughter [name] stays with patient the majority of the time. Confirmed face sheet information.   Patient has  home oxygen with [facility].  Will continue to follow. Awaiting PT/OT evals      Patient Goals and CMS Choice   CMS Medicare.gov Compare Post Acute Care list provided to:: Patient    Expected Discharge Plan and Services     Discharge Planning Services: CM Consult   Living arrangements for the past 2 months: Single Family Home                 DME Arranged: N/A                    Prior Living Arrangements/Services Living arrangements for the past 2 months: Single Family Home Lives with:: Self Patient language and need for interpreter reviewed:: Yes Do you feel safe going back to the place where you live?: Yes      Need for Family Participation in Patient Care: Yes (Comment) Care giver support system in place?: Yes (comment) Current home services: DME Criminal Activity/Legal Involvement Pertinent to Current Situation/Hospitalization: No - Comment as needed  Activities of Daily Living Home Assistive Devices/Equipment: Gilford Rile (specify type) ADL Screening (condition at time of admission) Patient's cognitive ability adequate to safely complete daily activities?: No Is the patient deaf or have difficulty hearing?: Yes Does the patient have difficulty seeing, even when wearing glasses/contacts?: Yes Does the patient have difficulty concentrating, remembering, or making decisions?: Yes Patient able to express need for assistance with ADLs?: Yes Does the patient have difficulty dressing or bathing?: Yes Independently performs ADLs?: No Does the  patient have difficulty walking or climbing stairs?: Yes Weakness of Legs: Both Weakness of Arms/Hands: Both  Permission Sought/Granted   Permission granted to share information with : Yes, Verbal Permission Granted  Share Information with NAME: [name] daughter           Emotional Assessment              Admission diagnosis:  Anemia [D64.9] Fall, initial encounter [W19.XXXA] Acute anemia [D64.9] Macrocytic anemia [D53.9] Patient Active Problem List   Diagnosis Date Noted  . Anemia 09/07/2019  . Macrocytic anemia 09/07/2019  . Pelvic pain 03/25/2019  . Dysphagia 02/18/2019  . Coronary artery disease involving native coronary artery of native heart with unstable angina pectoris ([place]) 01/30/2019  . Presence of drug coated stent in right coronary artery 01/30/2019  . Left main coronary artery disease - ~50-60% dLM (&70% dLAD) -- Plan Med Rx 01/30/2019  . Ischemic cardiomyopathy 01/30/2019  . Acute ST elevation myocardial infarction (STEMI) of inferior wall ([place]) 01/29/2019  . AAA (abdominal aortic aneurysm) ([place]) 12/31/2018  . Malignant neoplasm of upper-outer quadrant of right breast in female, estrogen receptor positive ([place]) 09/26/2018  . Breast mass 08/28/2018  . Bilateral impacted cerumen 08/14/2017  . Laryngopharyngeal reflux (LPR) 08/14/2017  . Presbycusis of both ears 08/14/2017  . Thoracic aortic aneurysm ([place]) 04/10/2017  . Abdominal pain 04/05/2017  . Syncope 01/24/2017  . Low back pain 01/01/2017  . Medicare annual wellness visit, subsequent 09/26/2016  .  Hard of hearing 09/26/2016  . Vitamin D deficiency 09/26/2016  . Advance care planning 09/26/2016  . Encounter for chronic pain management 07/26/2016  . Edema 10/21/2014  . Multinodular thyroid 07/07/2013  . HTN (hypertension) 06/09/2013  . Chronic respiratory failure ([place]) 05/13/2013  . Cough 05/02/2013  . Vertigo 12/31/2012  . Osteoarthritis of right knee 08/27/2011  . Gold C Copd with asthmatic  bronchitis component and chronic resp failure 06/26/2011  . Neck pain 04/02/2011  . RISK OF FALLING 08/17/2010  . Hypokalemia 05/24/2009  . Anemia, unspecified 04/10/2008  . Anxiety state 03/01/2008  . Osteoporosis 06/23/2007  . Chronic pain syndrome 12/26/2006  . Hypothyroidism 12/24/2006  . Hyperlipidemia with target LDL less than 70 12/24/2006  . COMMON MIGRAINE 12/24/2006  . GERD 12/24/2006  . DEGENERATIVE DISC DISEASE 12/24/2006  . Fibromyalgia 12/24/2006  . Insomnia 12/24/2006  . HYPERGLYCEMIA 12/24/2006   PCP:  Tonia Ghent, MD Pharmacy:   [facility], [place] [id number], [place] [address] Phone: [phone] Fax: [phone]  [place], [place] [address] Phone: [phone] Fax: [phone]  [facility] [place], [place] [address] [place] [address] Phone: [phone] Fax: [phone]     Social Determinants of Health (SDOH) Interventions    Readmission Risk Interventions No flowsheet data found.

## 2019-09-07 NOTE — Progress Notes (Signed)
Called and updated POA daughter about patient status and care plan.

## 2019-09-07 NOTE — Procedures (Signed)
Patient Name: [name]  MRN: [id number]  Epilepsy Attending: Lora Havens  Referring Physician/Provider: Dr Fuller Plan Date: 09/07/2019 Duration: 26.38 mins  Patient history: 86yo F with worsening encephalopathy and tremor like movements. EEG to evaluate for seizure  Level of alertness: awake  AEDs during EEG study: None  Technical aspects: This EEG study was done with scalp electrodes positioned according to the 10-20 International system of electrode placement. Electrical activity was acquired at a sampling rate of 500Hz  and reviewed with a high frequency filter of 70Hz  and a low frequency filter of 1Hz . EEG data were recorded continuously and digitally stored.   DESCRIPTION:  The posterior dominant rhythm consists of 7.5 Hz activity of moderate voltage (25-35 uV) seen predominantly in posterior head regions, symmetric and reactive to eye opening and eye closing.         Intermittent generalized 5-7Hz  theta activity was also noted.  Hyperventilation and photic stimulation were not performed.  ABNORMALITY - Intermittent slow, generalized   IMPRESSION: This study is suggestive of mild diffuse encephalopathy, non specific to etiology. No seizures or epileptiform discharges were seen throughout the recording.  Eriel Dunckel Barbra Sarks

## 2019-09-07 NOTE — ED Notes (Signed)
Date and time results received: 09/07/19  Test: hemoglobin Critical Value: 6.8  Name of Provider Notified:[place]

## 2019-09-08 DIAGNOSIS — Z66 Do not resuscitate: Secondary | ICD-10-CM | POA: Diagnosis not present

## 2019-09-08 DIAGNOSIS — I10 Essential (primary) hypertension: Secondary | ICD-10-CM | POA: Diagnosis not present

## 2019-09-08 DIAGNOSIS — D649 Anemia, unspecified: Secondary | ICD-10-CM | POA: Diagnosis not present

## 2019-09-08 HISTORY — DX: Do not resuscitate: Z66

## 2019-09-08 LAB — BASIC METABOLIC PANEL
Anion gap: 5 (ref 5–15)
BUN: 9 mg/dL (ref 8–23)
CO2: 38 mmol/L — ABNORMAL HIGH (ref 22–32)
Calcium: 8.7 mg/dL — ABNORMAL LOW (ref 8.9–10.3)
Chloride: 99 mmol/L (ref 98–111)
Creatinine, Ser: 0.55 mg/dL (ref 0.44–1.00)
GFR calc Af Amer: >60 mL/min (ref >60)
GFR calc non Af Amer: >60 mL/min (ref >60)
Glucose, Bld: 98 mg/dL (ref 70–99)
Potassium: 3.6 mmol/L (ref 3.5–5.1)
Sodium: 142 mmol/L (ref 135–145)

## 2019-09-08 LAB — TYPE AND SCREEN
ABO/RH(D): O POS
Antibody Screen: NEGATIVE
Unit division: 0
Unit division: 0

## 2019-09-08 LAB — BPAM RBC
Blood Product Expiration Date: 202103232359
Blood Product Expiration Date: 202103292359
ISSUE DATE / TIME: 202103010525
ISSUE DATE / TIME: 202103011944
Unit Type and Rh: 5100
Unit Type and Rh: 5100

## 2019-09-08 LAB — BLOOD GAS, ARTERIAL
Acid-Base Excess: 12.5 mmol/L — ABNORMAL HIGH (ref 0.0–2.0)
Bicarbonate: 37.8 mmol/L — ABNORMAL HIGH (ref 20.0–28.0)
Drawn by: 336831
FIO2: 28
O2 Saturation: 97.3 %
Patient temperature: 37
pCO2 arterial: 62.4 mmHg — ABNORMAL HIGH (ref 32.0–48.0)
pH, Arterial: 7.399 (ref 7.350–7.450)
pO2, Arterial: 90.9 mmHg (ref 83.0–108.0)

## 2019-09-08 LAB — CBC
HCT: 28.5 % — ABNORMAL LOW (ref 36.0–46.0)
Hemoglobin: 8.8 g/dL — ABNORMAL LOW (ref 12.0–15.0)
MCH: 29.8 pg (ref 26.0–34.0)
MCHC: 30.9 g/dL (ref 30.0–36.0)
MCV: 96.6 fL (ref 80.0–100.0)
Platelets: 160 10*3/uL (ref 150–400)
RBC: 2.95 MIL/uL — ABNORMAL LOW (ref 3.87–5.11)
RDW: 16.6 % — ABNORMAL HIGH (ref 11.5–15.5)
WBC: 4.1 10*3/uL (ref 4.0–10.5)
nRBC: 0 % (ref 0.0–0.2)

## 2019-09-08 MED ORDER — CLOPIDOGREL BISULFATE 75 MG PO TABS
75.0000 mg | ORAL_TABLET | Freq: Every day | ORAL | Status: DC
  Administered 2019-09-09: 75 mg via ORAL
  Filled 2019-09-08: qty 1

## 2019-09-08 MED ORDER — ALPRAZOLAM 0.25 MG PO TABS: 0.2500 mg | ORAL_TABLET | Freq: Three times a day (TID) | ORAL | Status: DC | PRN

## 2019-09-08 MED ORDER — DOCUSATE SODIUM 100 MG PO CAPS
100.0000 mg | ORAL_CAPSULE | Freq: Two times a day (BID) | ORAL | Status: DC
  Administered 2019-09-08 – 2019-09-09 (×3): 100 mg via ORAL
  Filled 2019-09-08 (×3): qty 1

## 2019-09-08 NOTE — Evaluation (Signed)
Physical Therapy Evaluation Patient Details Name: [name] MRN: [id number] DOB: 09-28-1930 Today's Date: 09/08/2019   History of Present Illness  Pt is a 86 y.o. female with medical history significant of CVA, COPD, oxygen dependent on 3.5 L, hypothyroidism, GERD, osteoporosis, and anxiety (see full PMH in H and P).  She presents after having a fall at home.  Pt admitted with macrocytic anemia.  Additionally daughter noticed slurred speech and R facial droop - MRI brain negative.  Clinical Impression  Pt admitted with above diagnosis. Limited PT evaluation due to pt reports no feeling well and declined ambulation past BSC transfer.  She required min A for cues and balance.  Pt with decreased safety awareness.   Pt currently with functional limitations due to the deficits listed below (see PT Problem List). Pt will benefit from skilled PT to increase their independence and safety with mobility to allow discharge to the venue listed below.       Follow Up Recommendations SNF;Supervision/Assistance - 24 hour;Home health PT(SNF if 24 hr supervision not available (pt reports lives alone, but daughters may be able to assist))    Equipment Recommendations  None recommended by PT    Recommendations for Other Services       Precautions / Restrictions Precautions Precautions: Fall      Mobility  Bed Mobility Overal bed mobility: Needs Assistance Bed Mobility: Supine to Sit;Sit to Supine     Supine to sit: Min assist Sit to supine: Min assist   General bed mobility comments: increased time; cues for safety  Transfers Overall transfer level: Needs assistance Equipment used: Rolling walker (2 wheeled) Transfers: Sit to/from Stand Sit to Stand: Min assist         General transfer comment: cues for RW use and for safe hand placement; performed x 2  Ambulation/Gait Ambulation/Gait assistance: Min assist Gait Distance (Feet): 3 Feet(x2) Assistive device: Rolling walker (2  wheeled) Gait Pattern/deviations: Decreased stride length;Shuffle Gait velocity: decreased   General Gait Details: only ambulated to [facility] and back to bed; refused further at this time due to not feeling well; cues for RW use  Stairs            Wheelchair Mobility    Modified Rankin (Stroke Patients Only)       Balance Overall balance assessment: Needs assistance Sitting-balance support: No upper extremity supported;Feet supported Sitting balance-Leahy Scale: Fair     Standing balance support: During functional activity;Single extremity supported Standing balance-Leahy Scale: Poor Standing balance comment: Required min A and single UE support during toileting ADLs for balance                             Pertinent Vitals/Pain Pain Assessment: No/denies pain    Home Living Family/patient expects to be discharged to:: Private residence Living Arrangements: Alone Available Help at Discharge: Family;Available PRN/intermittently(daughters help on occasion; reports they sometimes stay several days with her) Type of Home: House Home Access: Ramped entrance     Home Layout: One level Home Equipment: [place] - 2 wheels;Walker - 4 wheels;Shower seat;Wheelchair - Banker      Prior Function Level of Independence: Needs assistance   Gait / Transfers Assistance Needed: Ambulates with cane or rollator at home.  On 3 L O2 at baseline  ADL's / Homemaking Assistance Needed: Reports could do ADLs "sometimes."  Daughter assist with cooking and cleaning.        Hand Dominance  Extremity/Trunk Assessment   Upper Extremity Assessment Upper Extremity Assessment: Generalized weakness    Lower Extremity Assessment Lower Extremity Assessment: Generalized weakness    Cervical / Trunk Assessment Cervical / Trunk Assessment: Normal  Communication   Communication: HOH  Cognition Arousal/Alertness: Awake/alert Behavior During Therapy:  Anxious Overall Cognitive Status: No family/caregiver present to determine baseline cognitive functioning Area of Impairment: Safety/judgement                         Safety/Judgement: Decreased awareness of safety            General Comments General comments (skin integrity, edema, etc.): Pt reporting not feeling well today and not up for physical therapy. She did report needing to get up to [facility] and agreed to PT for that transfer and questions.    Exercises     Assessment/Plan    PT Assessment Patient needs continued PT services  PT Problem List Decreased strength;Decreased mobility;Decreased safety awareness;Decreased knowledge of precautions;Decreased activity tolerance;Decreased cognition;Cardiopulmonary status limiting activity;Decreased balance;Decreased knowledge of use of DME       PT Treatment Interventions DME instruction;Therapeutic activities;Gait training;Therapeutic exercise;Patient/family education;Balance training;Functional mobility training    PT Goals (Current goals can be found in the Care Plan section)  Acute Rehab PT Goals Patient Stated Goal: "I'm not sure" PT Goal Formulation: With patient Time For Goal Achievement: 09/22/19 Potential to Achieve Goals: Good    Frequency Min 3X/week   Barriers to discharge Decreased caregiver support      Co-evaluation               AM-PAC PT "6 Clicks" Mobility  Outcome Measure Help needed turning from your back to your side while in a flat bed without using bedrails?: A Little Help needed moving from lying on your back to sitting on the side of a flat bed without using bedrails?: A Little Help needed moving to and from a bed to a chair (including a wheelchair)?: A Little Help needed standing up from a chair using your arms (e.g., wheelchair or bedside chair)?: A Little Help needed to walk in hospital room?: A Little Help needed climbing 3-5 steps with a railing? : A Lot 6 Click Score: 17     End of Session Equipment Utilized During Treatment: Gait belt Activity Tolerance: Patient limited by fatigue Patient left: in bed;with call bell/phone within reach;with bed alarm set   PT Visit Diagnosis: Unsteadiness on feet (R26.81);Muscle weakness (generalized) (M62.81)    Time: [id number] PT Time Calculation (min) (ACUTE ONLY): 14 min   Charges:   PT Evaluation $PT Eval Low Complexity: 1 Low          Maggie Font, PT Acute Rehab Services Pager [phone] [facility] [phone] [facility] [phone]   Karlton Lemon 09/08/2019, 2:07 PM

## 2019-09-08 NOTE — Progress Notes (Addendum)
Progress Note    [name]  [id number] DOB: 05/24/1931  DOA: 09/07/2019 PCP: Tonia Ghent, MD    Brief Narrative:     Medical records reviewed and are as summarized below:  [name] is an 86 y.o. female with medical history significant of CVA, COPD, oxygen dependent on 3.5 L, hypothyroidism, GERD, osteoporosis, and anxiety.  She presents after having a fall at home.  At baseline patient lives alone with her daughter coming to check on her at least 3 days out of the week, and utilizes a walker to ambulate.  For the last several weeks she notes that she has been having episodes of jerking of her hands and legs and whole body.  She reports difficulty eating due to her symptoms. Associated symptoms include complaints of dyspnea on exertion and generalized weakness. Her daughter provides additional information and reports a decline in her health over the last 2 months.  Apparently she has been unable to walk or get around like she previously did.  Furthermore over the last 2 to 3 days her speech seemed more slurred, she was pale in color, complained of a headache, and they had noticed some right-sided facial drooping.  Patient also noted that she has had dark stools, but is not iron and has not noticed any gross blood.  She has never required transfusion of blood before and would not want to undergo colonoscopy at this time.    Assessment/Plan:   Active Problems:   Hypothyroidism   Hyperlipidemia with target LDL less than 70   HTN (hypertension)   Anemia   Macrocytic anemia   Jerking   Fall at home, initial encounter   History of CVA (cerebrovascular accident)   History of breast cancer    anemia: Acute on chronic.   -presented with a hemoglobin of 6.8 with elevated MCV of 105.4.   -s/p PRBCs: 2 units -Per chart review patient states that nurse tech witnessed red stool with last bowel movement -Discussed at length with patient, she does not want a GI  consult and she would not want to undergo a colonoscopy or any other procedures  Acute inferior ST elevation myocardial infarction in July 2020 -Patient had stent placed in the distal RCA -Cardiology recommendations include aspirin and Brilinta for 12 months.  Cardiology did say if bleeding issues occurred could switch to Plavix.  But would be helpful to continue Brilinta as long as possible before switching. -Aspirin was not on patient's home med list  Jerking and weakness: Acute.  Patient reports over the last several weeks that she has had increasing jerking and weakness.  Daughter also noted recent slurred speech and signs of right-sided facial droop over the last 2 to 3 days. -Neurochecks - MRI of brain w/o contrast: Moderate atrophy and moderate to advanced chronic ischemic change. No acute infarct. -EEG shows mild metabolic encephalopathy -Nursing reports patient attempted to put a pill in her purse that she thought was Xanax to "save it for later in case they do not give me enough"  Fall:  -PT/OT   COPD, oxygen dependent: Patient on 3.5 L of nasal cannula oxygen at baseline.  She reports some shortness of breath, but significant reports of wheezing.  Chest x-ray otherwise noted to be clear. -Continuous pulse oximetry with nasal cannula oxygen to maintain O2 saturations around 90%. -Continue Spiriva -Albuterol nebs as needed for shortness of breath -CO2 appears elevated will get ABG  Essential Hypertension: Home blood pressure medications  include bisoprolol 2.5 mg daily and losartan 12.5 mg daily. -Continue home blood pressure medication  Hypothyroidism: Last TSH noted to be 0.38 in 03/2018. -Check TSH -Continue levothyroxine  History of breast cancer: Patient with previous history of breast cancer for which she had resection and chemotherapy. -Continue Anastrozole  Hyperlipidemia  -Continue Crestor  Gerd: Home medications include omeprazole 20 mg bid. -Pharmacy  substitution Protonix 40 mg BID   confusion Spoke at length with daughter who reports that she has been sleepier and more confused lately.  Family unaware that she is on Xanax but states that prior she had been on oxycodone and the symptoms she was having is similar to when she was on the oxycodone -No sign of infection -Check ABG -MRI negative for CVA but does show some white matter changes  Family Communication/Anticipated D/C date and plan/Code Status   DVT prophylaxis: SCDs Code Status: DNR Family Communication: Spoke with daughter, see above Disposition Plan: Patient has made clear that she does not want GI to be consulted for her GI bleeding.  She prefers the watch and wait technique.  Will need to start Plavix ASAP (will order for tomorrow) as she had a stent in July 2020.  Patient was not on aspirin per [facility] prior to admission, not sure when she stopped this.  Can consider cardiology consult if needed.  Monitor H&H and transfuse as necessary.  We will asked nursing staff to clarify if bleeding is bright red coating the stool versus dark tarry stools.   Medical Consultants:    None.    Subjective:   Does not want GI called as she does not want any procedures  Objective:    Vitals:   09/07/19 2012 09/07/19 2245 09/08/19 0524 09/08/19 0810  BP: 131/79 139/64 (!) 175/82   Pulse: 66 65 63 71  Resp: 16 16 16 16   Temp: 98.5 F (36.9 C) 98.3 F (36.8 C) 98.1 F (36.7 C)   TempSrc: Oral Oral Oral   SpO2: 99% 99% 100% 97%  Weight:      Height:        Intake/Output Summary (Last 24 hours) at 09/08/2019 1046 Last data filed at 09/08/2019 [id number] Gross per 24 hour  Intake 660 ml  Output 300 ml  Net 360 ml   Filed Weights   09/07/19 0218  Weight: 62.1 kg    Exam: In bed, mild tremor Pleasant and cooperative Positive bowel sounds, soft, nontender No large area of bruising seen on skin Regular rate and rhythm On 3.5 L nasal cannula, no increased work of breathing,  diminished breath sounds  Data Reviewed:   I have personally reviewed following labs and imaging studies:  Labs: Labs show the following:   Basic Metabolic Panel: Recent Labs  Lab 09/07/19 0310 09/08/19 0134  NA 145 142  K 3.6 3.6  CL 99 99  CO2 39* 38*  GLUCOSE 111* 98  BUN 13 9  CREATININE 0.64 0.55  CALCIUM 8.9 8.7*   GFR Estimated Creatinine Clearance: 42.6 mL/min (by C-G formula based on SCr of 0.55 mg/dL). Liver Function Tests: Recent Labs  Lab 09/07/19 0310  AST 23  ALT 10  ALKPHOS 29*  BILITOT 0.4  PROT 5.8*  ALBUMIN 3.1*   No results for input(s): LIPASE, AMYLASE in the last 168 hours. No results for input(s): AMMONIA in the last 168 hours. Coagulation profile Recent Labs  Lab 09/07/19 0310  INR 1.1    CBC: Recent Labs  Lab 09/07/19 0310 09/07/19  1353 09/08/19 0134  WBC 4.2  --  4.1  NEUTROABS 2.4  --   --   HGB 6.8* 7.0* 8.8*  HCT 23.6* 23.6* 28.5*  MCV 105.4*  --  96.6  PLT 187  --  160   Cardiac Enzymes: No results for input(s): CKTOTAL, CKMB, CKMBINDEX, TROPONINI in the last 168 hours. BNP (last 3 results) Recent Labs    03/27/19 1140  PROBNP 461.0*   CBG: No results for input(s): GLUCAP in the last 168 hours. D-Dimer: No results for input(s): DDIMER in the last 72 hours. Hgb A1c: No results for input(s): HGBA1C in the last 72 hours. Lipid Profile: No results for input(s): CHOL, HDL, LDLCALC, TRIG, CHOLHDL, LDLDIRECT in the last 72 hours. Thyroid function studies: Recent Labs    09/07/19 1045  TSH 3.577   Anemia work up: Recent Labs    09/07/19 1045  VITAMINB12 336  FOLATE 19.3  FERRITIN 20  TIBC 316  IRON 46  RETICCTPCT 1.5   Sepsis Labs: Recent Labs  Lab 09/07/19 0310 09/08/19 0134  WBC 4.2 4.1    Microbiology Recent Results (from the past 240 hour(s))  SARS CORONAVIRUS 2 (TAT 6-24 HRS) Nasopharyngeal Nasopharyngeal Swab     Status: None   Collection Time: 09/07/19  4:10 AM   Specimen: Nasopharyngeal  Swab  Result Value Ref Range Status   SARS Coronavirus 2 NEGATIVE NEGATIVE Final    Comment: (NOTE) SARS-CoV-2 target nucleic acids are NOT DETECTED. The SARS-CoV-2 RNA is generally detectable in upper and lower respiratory specimens during the acute phase of infection. Negative results do not preclude SARS-CoV-2 infection, do not rule out co-infections with other pathogens, and should not be used as the sole basis for treatment or other patient management decisions. Negative results must be combined with clinical observations, patient history, and epidemiological information. The expected result is Negative. Fact Sheet for Patients: SugarRoll.be Fact Sheet for Healthcare Providers: https://www.woods-mathews.com/ This test is not yet approved or cleared by the [place] FDA and  has been authorized for detection and/or diagnosis of SARS-CoV-2 by FDA under an Emergency Use Authorization (EUA). This EUA will remain  in effect (meaning this test can be used) for the duration of the COVID-19 declaration under Section 56 4(b)(1) of the Act, 21 U.S.C. section 360bbb-3(b)(1), unless the authorization is terminated or revoked sooner. Performed at [facility], [place] [address], [place], [place] [address]     Procedures and diagnostic studies:  CT HEAD WO CONTRAST  Result Date: 09/07/2019 CLINICAL DATA:  Recent fall with altered mental status EXAM: CT HEAD WITHOUT CONTRAST TECHNIQUE: Contiguous axial images were obtained from the base of the skull through the vertex without intravenous contrast. COMPARISON:  01/23/2017 FINDINGS: Brain: Chronic atrophic and ischemic changes are again identified and stable. No findings to suggest acute hemorrhage, acute infarction or space-occupying mass lesion is noted. Vascular: No hyperdense vessel or unexpected calcification. Skull: Normal. Negative for fracture or focal lesion. Sinuses/Orbits: Mild mucosal  thickening is noted within the ethmoid, frontal right maxillary sinuses. Other: Bilateral subdural hygromas are stable in appearance from exam. IMPRESSION: Chronic atrophic and ischemic changes without acute abnormality. Electronically Signed   By: Inez Catalina M.D.   On: 09/07/2019 03:17   MR BRAIN WO CONTRAST  Result Date: 09/07/2019 CLINICAL DATA:  Stroke follow-up.  History of breast cancer. EXAM: MRI HEAD WITHOUT CONTRAST TECHNIQUE: Multiplanar, multiecho pulse sequences of the brain and surrounding structures were obtained without intravenous contrast. COMPARISON:  CT head  09/07/2019 FINDINGS: Brain: Negative for acute infarct. Moderate to extensive chronic microvascular ischemic change in the white matter and pons. Small chronic infarcts in the cerebellum bilaterally. Generalized atrophy. 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 Negative for hemorrhage or mass. No midline shift. Vascular: Normal arterial flow voids Skull and upper cervical spine: No focal skeletal lesion. Sinuses/Orbits: Mucosal edema paranasal sinuses. Bilateral mastoid effusion. Bilateral cataract extraction Other: None IMPRESSION: Moderate atrophy and moderate to advanced chronic ischemic change. No acute infarct. Sinus mucosal disease.  Bilateral mastoid effusion. Electronically Signed   By: Franchot Gallo M.D.   On: 09/07/2019 13:15   DG Pelvis Portable  Result Date: 09/07/2019 CLINICAL DATA:  Left hip pain EXAM: PORTABLE PELVIS 1-2 VIEWS COMPARISON:  None. FINDINGS: There is no evidence of pelvic fracture or diastasis. There is diffuse osteopenia. The sacroiliac joints appear to be intact. Degenerative changes in the lower lumbar spine. Moderate bilateral hip osteoarthritis. IMPRESSION: No definite acute fracture.  Moderate bilateral hip osteoarthritis. Electronically Signed   By: Prudencio Pair M.D.   On: 09/07/2019 05:16   DG Chest Port 1 View  Result Date:  09/07/2019 CLINICAL DATA:  Chest pain, history of recent fall EXAM: PORTABLE CHEST 1 VIEW COMPARISON:  12/27/2018 FINDINGS: Cardiac shadow is within normal limits. Tortuosity of the thoracic aorta is noted accentuated by patient rotation to the right. No focal infiltrate or sizable effusion is seen. Postsurgical changes in the right shoulder are noted. No acute bony abnormality is seen. IMPRESSION: No acute abnormality noted. Electronically Signed   By: Inez Catalina M.D.   On: 09/07/2019 02:50   EEG adult  Result Date: 09/07/2019 Lora Havens, MD     09/07/2019  5:05 PM Patient Name: Alondria Tingen MRN: [id number] Epilepsy Attending: Lora Havens Referring Physician/Provider: Dr Fuller Plan Date: 09/07/2019 Duration: 26.38 mins Patient history: 86yo F with worsening encephalopathy and tremor like movements. EEG to evaluate for seizure Level of alertness: awake AEDs during EEG study: None Technical aspects: This EEG study was done with scalp electrodes positioned according to the 10-20 International system of electrode placement. Electrical activity was acquired at a sampling rate of 500Hz  and reviewed with a high frequency filter of 70Hz  and a low frequency filter of 1Hz . EEG data were recorded continuously and digitally stored. DESCRIPTION:  The posterior dominant rhythm consists of 7.5 Hz activity of moderate voltage (25-35 uV) seen predominantly in posterior head regions, symmetric and reactive to eye opening and eye closing.        Intermittent generalized 5-7Hz  theta activity was also noted.  Hyperventilation and photic stimulation were not performed. ABNORMALITY - Intermittent slow, generalized IMPRESSION: This study is suggestive of mild diffuse encephalopathy, non specific to etiology. No seizures or epileptiform discharges were seen throughout the recording. Priyanka Barbra Sarks    Medications:   . sodium chloride   Intravenous Once  . anastrozole  1 mg Oral Daily  . bisoprolol  2.5 mg  Oral Daily  . DULoxetine  60 mg Oral Daily  . ferrous sulfate  325 mg Oral Q breakfast  . levothyroxine  112 mcg Oral Q0600  . losartan  12.5 mg Oral Daily  . pantoprazole  40 mg Oral BID  . rosuvastatin  10 mg Oral Daily  . sodium chloride flush  3 mL Intravenous Q12H  . umeclidinium bromide  1 puff Inhalation Daily   Continuous Infusions:   LOS: 0 days  Geradine Girt  [facility]   How to contact the [facility] Attending or Consulting provider [place] or covering provider during after hours [place], for this patient?  1. Check the care team in [facility] and look for a) attending/consulting TR[facility] provider listed and b) the [facility] team listed 2. Log into [email or web address] and use [place]'s universal password to access. If you do not have the password, please contact the hospital operator. 3. Locate the [facility] provider you are looking for under [facility] and page to a number that you can be directly reached. 4. If you still have difficulty reaching the provider, please page the [facility] (Director on Call) for the Hospitalists listed on amion for assistance.  09/08/2019, 10:46 AM

## 2019-09-08 NOTE — Evaluation (Addendum)
Occupational Therapy Evaluation Patient Details Name: [name] MRN: [id number] DOB: [birth year] Today's Date: 09/08/2019    History of Present Illness Pt is a 86 y.o. female with medical history significant of CVA, COPD, oxygen dependent on 3.5 L, hypothyroidism, GERD, osteoporosis, and anxiety (see full PMH in H and P).  She presents after having a fall at home.  Pt admitted with macrocytic anemia.  Additionally daughter noticed slurred speech and R facial droop - MRI brain negative.   Clinical Impression   This 86 y/o female presents with the above. PTA pt reports mod independence with ADL and functional mobility, reports daughter checks in often. Today pt presenting with weakness, impaired cognition, decreased standing balance and functional performance. Pt overall requiring minA for functional transfers using RW, tolerating stand pivot to [facility] as she reports feeling too weak to attempt further mobility. Pt requiring minA for toileting and LB ADL during session. She will benefit from continued acute OT services, feel pt will be able to return home with [facility] services pending availability of 24hr supervision/assist at time of discharge. If 24hr supervision/assist not available pt will likely require ST SNF initially. Will continue to follow acutely.      Follow Up Recommendations  Home health OT;Supervision/Assistance - 24 hour;SNF(pending available family assist)    Equipment Recommendations  3 in 1 bedside commode           Precautions / Restrictions Precautions Precautions: Fall Restrictions Weight Bearing Restrictions: No      Mobility Bed Mobility Overal bed mobility: Needs Assistance Bed Mobility: Supine to Sit;Sit to Supine     Supine to sit: Min guard Sit to supine: Min guard   General bed mobility comments: for safety, increased time and effort  Transfers Overall transfer level: Needs assistance Equipment used: Rolling walker (2 wheeled) Transfers: Sit  to/from [facility] Sit to Stand: Min assist Stand pivot transfers: Min assist       General transfer comment: boosting and steadying assist from EOB and BSC, stood from EOB x2    Balance Overall balance assessment: Needs assistance Sitting-balance support: No upper extremity supported;Feet supported Sitting balance-Leahy Scale: Fair     Standing balance support: During functional activity;Single extremity supported Standing balance-Leahy Scale: Poor Standing balance comment: requires at least single UE support or external assist                           ADL either performed or assessed with clinical judgement   ADL Overall ADL's : Needs assistance/impaired Eating/Feeding: Set up;Sitting   Grooming: Wash/dry hands;Set up;Min guard;Sitting   Upper Body Bathing: Min guard;Set up;Sitting   Lower Body Bathing: Minimal assistance;Sit to/from stand   Upper Body Dressing : Set up;Min guard;Sitting   Lower Body Dressing: Minimal assistance;Sit to/from stand Lower Body Dressing Details (indicate cue type and reason): pt donning mesh underwear, minA for standing balance Toilet Transfer: Minimal assistance;Stand-pivot;RW;BSC   Toileting- Clothing Manipulation and Hygiene: Minimal assistance;Sit to/from stand Toileting - Clothing Manipulation Details (indicate cue type and reason): assist for standing balance, assist to ensure thoroughness with pericare after BM     Functional mobility during ADLs: Minimal assistance;Rolling walker       Vision         Perception     Praxis      Pertinent Vitals/Pain Pain Assessment: No/denies pain     Hand Dominance Right   Extremity/Trunk Assessment Upper Extremity Assessment Upper Extremity Assessment: Generalized  weakness   Lower Extremity Assessment Lower Extremity Assessment: Generalized weakness;Defer to PT evaluation   Cervical / Trunk Assessment Cervical / Trunk Assessment: Normal    Communication Communication Communication: HOH   Cognition Arousal/Alertness: Awake/alert Behavior During Therapy: WFL for tasks assessed/performed Overall Cognitive Status: No family/caregiver present to determine baseline cognitive functioning Area of Impairment: Safety/judgement;Memory                     Memory: Decreased short-term memory   Safety/Judgement: Decreased awareness of safety     General Comments: pt initially thought OT was her daughter   General Comments  Pt reporting not feeling well today and not up for physical therapy. She did report needing to get up to [facility] and agreed to PT for that transfer and questions.    Exercises     Shoulder Instructions      Home Living Family/patient expects to be discharged to:: Private residence Living Arrangements: Alone Available Help at Discharge: Family;Available PRN/intermittently Type of Home: House Home Access: Ramped entrance     Home Layout: One level     Bathroom Shower/Tub: Tub/shower unit;Walk-in shower   Bathroom Toilet: Standard     Home Equipment: Environmental consultant - 2 wheels;Walker - 4 wheels;Shower seat;Wheelchair - Banker          Prior Functioning/Environment Level of Independence: Needs assistance  Gait / Transfers Assistance Needed: Ambulates with cane or rollator at home.  On 3 L O2 at baseline ADL's / Homemaking Assistance Needed: Reports could do ADLs "sometimes."  Daughter assist with cooking and cleaning.            OT Problem List: Decreased strength;Decreased range of motion;Decreased activity tolerance;Impaired balance (sitting and/or standing);Decreased cognition;Decreased safety awareness;Decreased knowledge of use of DME or AE      OT Treatment/Interventions: Self-care/ADL training;Therapeutic exercise;Neuromuscular education;DME and/or AE instruction;Therapeutic activities;Patient/family education;Balance training    OT Goals(Current goals can be found in the  care plan section) Acute Rehab OT Goals Patient Stated Goal: home when able OT Goal Formulation: With patient Time For Goal Achievement: 09/22/19 Potential to Achieve Goals: Good  OT Frequency: Min 2X/week   Barriers to D/C:            Co-evaluation              AM-PAC OT "6 Clicks" Daily Activity     Outcome Measure Help from another person eating meals?: None Help from another person taking care of personal grooming?: A Little Help from another person toileting, which includes using toliet, bedpan, or urinal?: A Little Help from another person bathing (including washing, rinsing, drying)?: A Little Help from another person to put on and taking off regular upper body clothing?: A Little Help from another person to put on and taking off regular lower body clothing?: A Little 6 Click Score: 19   End of Session Equipment Utilized During Treatment: Rolling walker Nurse Communication: Mobility status  Activity Tolerance: Patient tolerated treatment well Patient left: in bed;with call bell/phone within reach;with bed alarm set(RT arriving to room)  OT Visit Diagnosis: Unsteadiness on feet (R26.81);Muscle weakness (generalized) (M62.81);Other symptoms and signs involving cognitive function                Time: 1630-1650 OT Time Calculation (min): 20 min Charges:  OT General Charges $OT Visit: 1 Visit OT Evaluation $OT Eval Moderate Complexity: [place], OT [employer] Pager [phone] Office [place] 09/08/2019,  5:27 PM

## 2019-09-08 NOTE — Progress Notes (Signed)
Provider made aware nurse tech witnessed red stool when patient had bowel movement.

## 2019-09-09 DIAGNOSIS — K922 Gastrointestinal hemorrhage, unspecified: Secondary | ICD-10-CM

## 2019-09-09 DIAGNOSIS — Y92009 Unspecified place in unspecified non-institutional (private) residence as the place of occurrence of the external cause: Secondary | ICD-10-CM | POA: Diagnosis not present

## 2019-09-09 DIAGNOSIS — J449 Chronic obstructive pulmonary disease, unspecified: Secondary | ICD-10-CM | POA: Diagnosis not present

## 2019-09-09 DIAGNOSIS — D62 Acute posthemorrhagic anemia: Secondary | ICD-10-CM | POA: Diagnosis not present

## 2019-09-09 DIAGNOSIS — W19XXXA Unspecified fall, initial encounter: Secondary | ICD-10-CM | POA: Diagnosis not present

## 2019-09-09 HISTORY — DX: Gastrointestinal hemorrhage, unspecified: K92.2

## 2019-09-09 LAB — CBC
HCT: 30.6 % — ABNORMAL LOW (ref 36.0–46.0)
Hemoglobin: 9.5 g/dL — ABNORMAL LOW (ref 12.0–15.0)
MCH: 29.3 pg (ref 26.0–34.0)
MCHC: 31 g/dL (ref 30.0–36.0)
MCV: 94.4 fL (ref 80.0–100.0)
Platelets: 154 10*3/uL (ref 150–400)
RBC: 3.24 MIL/uL — ABNORMAL LOW (ref 3.87–5.11)
RDW: 15.5 % (ref 11.5–15.5)
WBC: 3.6 10*3/uL — ABNORMAL LOW (ref 4.0–10.5)
nRBC: 0 % (ref 0.0–0.2)

## 2019-09-09 LAB — BASIC METABOLIC PANEL
Anion gap: 8 (ref 5–15)
BUN: 8 mg/dL (ref 8–23)
CO2: 34 mmol/L — ABNORMAL HIGH (ref 22–32)
Calcium: 8.7 mg/dL — ABNORMAL LOW (ref 8.9–10.3)
Chloride: 99 mmol/L (ref 98–111)
Creatinine, Ser: 0.52 mg/dL (ref 0.44–1.00)
GFR calc Af Amer: >60 mL/min (ref >60)
GFR calc non Af Amer: >60 mL/min (ref >60)
Glucose, Bld: 90 mg/dL (ref 70–99)
Potassium: 3.3 mmol/L — ABNORMAL LOW (ref 3.5–5.1)
Sodium: 141 mmol/L (ref 135–145)

## 2019-09-09 LAB — SARS CORONAVIRUS 2 (TAT 6-24 HRS): SARS Coronavirus 2: NEGATIVE

## 2019-09-09 LAB — CK: Total CK: 61 U/L (ref 38–234)

## 2019-09-09 MED ORDER — CLOPIDOGREL BISULFATE 75 MG PO TABS: 75.0000 mg | ORAL_TABLET | Freq: Every day | ORAL | 0 refills | Status: DC

## 2019-09-09 MED ORDER — POTASSIUM CHLORIDE CRYS ER 20 MEQ PO TBCR
40.0000 meq | EXTENDED_RELEASE_TABLET | ORAL | Status: AC
  Administered 2019-09-09 (×2): 40 meq via ORAL
  Filled 2019-09-09 (×2): qty 2

## 2019-09-09 MED FILL — CLOPIDOGREL 75 MG TABLET: 75 | 30 days supply | Qty: 30 | Fill #0

## 2019-09-09 NOTE — Progress Notes (Signed)
Occupational Therapy Treatment Patient Details Name: [name] MRN: [id number] DOB: 28-Feb-1931 Today's Date: 09/09/2019    History of present illness Pt is a 86 y.o. female with medical history significant of CVA, COPD, oxygen dependent on 3.5 L, hypothyroidism, GERD, osteoporosis, and anxiety (see full PMH in H and P).  She presents after having a fall at home.  Pt admitted with macrocytic anemia.  Additionally daughter noticed slurred speech and R facial droop - MRI brain negative.   OT comments  Pt needed to use the bathroom, urgently, so used BSC.  Ambulated to sink to wash hands then performed combing hair at seated level. Took an extended seated rest break, reviewed energy conservation and returned to bed at end of session at pt's request  Follow Up Recommendations  Home health OT;Supervision/Assistance - 24 hour(vs SNF, if she doesn't have needed assistance)    Equipment Recommendations  3 in 1 bedside commode    Recommendations for Other Services      Precautions / Restrictions Precautions Precautions: Fall Restrictions Weight Bearing Restrictions: No       Mobility Bed Mobility               General bed mobility comments: supervision for in/out of bed  Transfers   Equipment used: Rolling walker (2 wheeled)   Sit to Stand: Min assist         General transfer comment: assist to rise and steady. Cues for UE placement    Balance                                           ADL either performed or assessed with clinical judgement   ADL       Grooming: Wash/dry hands;Min guard;Standing                   Toilet Transfer: Minimal assistance;BSC;RW;Stand-pivot   Toileting- Clothing Manipulation and Hygiene: Minimal assistance;Sit to/from stand         General ADL Comments: combed hair at seated level. Pt sat for an extended rest break at sink then returned to bed, at her request.  Educated on energy conservation      Vision       Perception     Praxis      Cognition Arousal/Alertness: Awake/alert Behavior During Therapy: WFL for tasks assessed/performed Overall Cognitive Status: No family/caregiver present to determine baseline cognitive functioning                       Memory: Decreased short-term memory   Safety/Judgement: Decreased awareness of safety     General Comments: Pt reports she doesn't see well; talked a lot during tx and was distracted from what she was looking for (comb)  cues for safety during session        Exercises     Shoulder Instructions       General Comments      Pertinent Vitals/ Pain       Pain Assessment: No/denies pain  Home Living                                          Prior Functioning/Environment              Frequency  Min 2X/week        Progress Toward Goals  OT Goals(current goals can now be found in the care plan section)  Progress towards OT goals: Progressing toward goals     Plan      Co-evaluation                 AM-PAC OT "6 Clicks" Daily Activity     Outcome Measure   Help from another person eating meals?: None Help from another person taking care of personal grooming?: A Little Help from another person toileting, which includes using toliet, bedpan, or urinal?: A Little Help from another person bathing (including washing, rinsing, drying)?: A Little Help from another person to put on and taking off regular upper body clothing?: A Little Help from another person to put on and taking off regular lower body clothing?: A Little 6 Click Score: 19    End of Session    OT Visit Diagnosis: Unsteadiness on feet (R26.81);Muscle weakness (generalized) (M62.81);Other symptoms and signs involving cognitive function   Activity Tolerance Patient tolerated treatment well   Patient Left in bed;with call bell/phone within reach;with bed alarm set   Nurse Communication           Time: [id number] OT Time Calculation (min): 34 min  Charges: OT General Charges $OT Visit: 1 Visit OT Treatments $Self Care/Home Management : 23-37 mins  Anael Rosch S, OTR/L Acute Rehabilitation Services 09/09/2019   [place] 09/09/2019, 10:51 AM

## 2019-09-09 NOTE — NC FL2 (Signed)
[employer] MEDICAID FL2 LEVEL OF CARE SCREENING TOOL     IDENTIFICATION  Patient Name: [name] Birthdate: 12/06/1930 Sex: female Admission Date (Current Location): 09/07/2019  [facility] and [place] Number:  Herbalist and Address:  [facility] [place], [place] [address] [place], [place] [address]      Provider Number: [id number]  Attending Physician Name and Address:  Darliss Cheney, MD  Relative Name and Phone Number:       Current Level of Care: Hospital Recommended Level of Care: [place] Prior Approval Number:    Date Approved/Denied:   PASRR Number: [id number]  Discharge Plan: SNF    Current Diagnoses: Patient Active Problem List   Diagnosis Date Noted  . Lower GI bleeding 09/09/2019  . DNR (do not resuscitate) 09/08/2019  . Anemia 09/07/2019  . Macrocytic anemia 09/07/2019  . Jerking 09/07/2019  . Fall at home, initial encounter 09/07/2019  . History of CVA (cerebrovascular accident) 09/07/2019  . History of breast cancer 09/07/2019  . Pelvic pain 03/25/2019  . Dysphagia 02/18/2019  . Coronary artery disease involving native coronary artery of native heart with unstable angina pectoris ([place]) 01/30/2019  . Presence of drug coated stent in right coronary artery 01/30/2019  . Left main coronary artery disease - ~50-60% dLM (&70% dLAD) -- Plan Med Rx 01/30/2019  . Ischemic cardiomyopathy 01/30/2019  . Acute ST elevation myocardial infarction (STEMI) of inferior wall ([place]) 01/29/2019  . AAA (abdominal aortic aneurysm) ([place]) 12/31/2018  . Malignant neoplasm of upper-outer quadrant of right breast in female, estrogen receptor positive ([place]) 09/26/2018  . Breast mass 08/28/2018  . Bilateral impacted cerumen 08/14/2017  . Laryngopharyngeal reflux (LPR) 08/14/2017  . Presbycusis of both ears 08/14/2017  . Thoracic aortic aneurysm ([place]) 04/10/2017  . Abdominal pain 04/05/2017  . Syncope 01/24/2017  . Low back pain  01/01/2017  . Medicare annual wellness visit, subsequent 09/26/2016  . Hard of hearing 09/26/2016  . Vitamin D deficiency 09/26/2016  . Advance care planning 09/26/2016  . Encounter for chronic pain management 07/26/2016  . Edema 10/21/2014  . Multinodular thyroid 07/07/2013  . HTN (hypertension) 06/09/2013  . Chronic respiratory failure ([place]) 05/13/2013  . Cough 05/02/2013  . Vertigo 12/31/2012  . Acute blood loss anemia 12/13/2011  . Osteoarthritis of right knee 08/27/2011  . Gold C Copd with asthmatic bronchitis component and chronic resp failure 06/26/2011  . Neck pain 04/02/2011  . RISK OF FALLING 08/17/2010  . Hypokalemia 05/24/2009  . Anemia, unspecified 04/10/2008  . Anxiety state 03/01/2008  . Osteoporosis 06/23/2007  . Chronic pain syndrome 12/26/2006  . Hypothyroidism 12/24/2006  . Hyperlipidemia with target LDL less than 70 12/24/2006  . COMMON MIGRAINE 12/24/2006  . GERD 12/24/2006  . DEGENERATIVE DISC DISEASE 12/24/2006  . Fibromyalgia 12/24/2006  . Insomnia 12/24/2006  . HYPERGLYCEMIA 12/24/2006    Orientation RESPIRATION BLADDER Height & Weight     Self, Time, Situation, Place  O2(2L nasal canula) Incontinent Weight: 137 lb (62.1 kg) Height:  5\' 1"  (154.9 cm)  BEHAVIORAL SYMPTOMS/MOOD NEUROLOGICAL BOWEL NUTRITION STATUS      Continent Diet(see discharge summary)  AMBULATORY STATUS COMMUNICATION OF NEEDS Skin   Extensive Assist Verbally Other (Comment)(generalized ecchymosis)                       Personal Care Assistance Level of Assistance  Dressing, Feeding, Bathing Bathing Assistance: Maximum assistance Feeding assistance: Independent Dressing Assistance: Maximum assistance  Functional Limitations Info  Sight, Hearing, Speech Sight Info: Adequate Hearing Info: Impaired Speech Info: Adequate    SPECIAL CARE FACTORS FREQUENCY  PT (By licensed PT), OT (By licensed OT)     PT Frequency: 5x week OT Frequency: 5x week             Contractures Contractures Info: Not present    Additional Factors Info  Allergies, Code Status, Psychotropic Code Status Info: DNR Allergies Info: Doxycycline, Sulfadiazine, Sulfa Antibiotics Psychotropic Info: DULoxetine (CYMBALTA) DR capsule 60 mg daily PO         Current Medications (09/09/2019):  This is the current hospital active medication list Current Facility-Administered Medications  Medication Dose Route Frequency Provider Last Rate Last Admin  . 0.9 %  sodium chloride infusion (Manually program via Guardrails IV Fluids)   Intravenous Once Fuller Plan A, MD      . acetaminophen (TYLENOL) tablet 650 mg  650 mg Oral Q6H PRN Fuller Plan A, MD   650 mg at 09/08/19 2148   Or  . acetaminophen (TYLENOL) suppository 650 mg  650 mg Rectal Q6H PRN Fuller Plan A, MD      . ALPRAZolam Duanne Moron) tablet 0.25 mg  0.25 mg Oral TID PRN Eulogio Bear U, DO      . anastrozole (ARIMIDEX) tablet 1 mg  1 mg Oral Daily Tamala Julian, Rondell A, MD   1 mg at 09/09/19 0903  . bisoprolol (ZEBETA) tablet 2.5 mg  2.5 mg Oral Daily Tamala Julian, Rondell A, MD   2.5 mg at 09/09/19 0903  . clopidogrel (PLAVIX) tablet 75 mg  75 mg Oral Daily Eulogio Bear U, DO   75 mg at 09/09/19 [id number]  . diclofenac Sodium (VOLTAREN) 1 % topical gel 2 g  2 g Topical QID PRN Fuller Plan A, MD      . docusate sodium (COLACE) capsule 100 mg  100 mg Oral BID Vann, Jessica U, DO   100 mg at 09/09/19 0904  . DULoxetine (CYMBALTA) DR capsule 60 mg  60 mg Oral Daily Fuller Plan A, MD   60 mg at 09/09/19 0904  . ferrous sulfate tablet 325 mg  325 mg Oral Q breakfast Fuller Plan A, MD   325 mg at 09/09/19 0641  . hydroxypropyl methylcellulose / hypromellose (ISOPTO TEARS / GONIOVISC) 2.5 % ophthalmic solution 1 drop  1 drop Both Eyes TID PRN Fuller Plan A, MD      . levothyroxine (SYNTHROID) tablet 112 mcg  112 mcg Oral Q0600 Fuller Plan A, MD   112 mcg at 09/09/19 0641  . losartan (COZAAR) tablet 12.5 mg  12.5 mg Oral Daily  Tamala Julian, Rondell A, MD   12.5 mg at 09/09/19 0904  . pantoprazole (PROTONIX) EC tablet 40 mg  40 mg Oral BID Fuller Plan A, MD   40 mg at 09/09/19 0904  . potassium chloride SA (KLOR-CON) CR tablet 40 mEq  40 mEq Oral Q4H Darliss Cheney, MD   40 mEq at 09/09/19 0902  . rosuvastatin (CRESTOR) tablet 10 mg  10 mg Oral Daily Fuller Plan A, MD   10 mg at 09/09/19 0904  . sodium chloride flush (NS) 0.9 % injection 3 mL  3 mL Intravenous Q12H Smith, Rondell A, MD   3 mL at 09/09/19 0910  . traZODone (DESYREL) tablet 150 mg  150 mg Oral QHS PRN Fuller Plan A, MD   150 mg at 09/08/19 2148  . umeclidinium bromide (INCRUSE ELLIPTA) 62.5 MCG/INH 1 puff  1 puff  Inhalation Daily Norval Morton, MD   1 puff at 09/09/19 [id number]     Discharge Medications: Please see discharge summary for a list of discharge medications.  Relevant Imaging Results:  Relevant Lab Results:   Additional Information SS# [id number] [address] [place], [place]

## 2019-09-09 NOTE — Discharge Summary (Signed)
Physician Discharge Summary  [name] [id number] DOB: September 17, 1930 DOA: 09/07/2019  PCP: Tonia Ghent, MD  Admit date: 09/07/2019 Discharge date: 09/09/2019  Admitted From: Home Disposition: Home  Recommendations for Outpatient Follow-up:  1. Follow up with PCP in 1-2 weeks 2. Follow with cardiologist in 1 to 2 weeks 3. Please obtain BMP/CBC in one week 4. Please follow up on the following pending results:  Home Health: Yes Equipment/Devices: Bedside commode  Discharge Condition: Stable CODE STATUS: DNR Diet recommendation: Cardiac  Subjective: Seen and examined.  Feels much better.  Completely alert and oriented.  No complaints.  Brief/Interim Summary: [name] is an 86 y.o. female with medical history significant ofCVA, COPD, oxygen dependent on 3.5 L, hypothyroidism, GERD, osteoporosis, and anxiety.She presented to ED after having a fall at home. At baseline patient lives alone with her daughter coming to check on her at least 3 days out of the week, and utilizes a walker to ambulate. For the last several weeks she notes that she has been having episodes of jerking of her hands and legs and whole body. She reports difficulty eating due to her symptoms. Associated symptoms include complaints of dyspnea on exertion andgeneralized weakness.Her daughter provides additional information and reports a decline in her health over the last 2 months. Apparently she has been unable to walk or get around like she previously did. Furthermore over the last 2 to 3 days her speech seemed more slurred, she was pale in color, complained of a headache, and they had noticed some right-sided facial drooping. Patient also noted that she has had dark stools, but is not iron and has not noticed any gross blood. She had never required transfusion of blood before and would not want to undergo colonoscopy at this time.   Upon presentation, her hemoglobin was found to be 6.5 with  MCV of 105.  She was admitted to hospital service.  Received 2 units of PRBC transfusion.  During this hospitalization, staff had noted one episode of bright red blood per rectum.  Patient was offered GI consultation for possible colonoscopy and EGD however patient declined that and did not want to have any procedure done.  Due to possible GI bleed, patient's Brilinta was held.  Patient had ST elevation MI in July 2020 for which she had stent placed in distal RCA and she was discharged on aspirin and Brilinta by cardiology with instructions to continue for 12 months.  We later found out that patient had stopped taking aspirin about a week or 2 ago on her own without consulting with cardiologist however she was taking Brilinta.  Her Brilinta was held here due to GI bleed.  Surprisingly, fecal occult blood test was negative.  She also had been having some jerking episodes along with right facial drooping and generalized weakness.  EEG was done which showed mild metabolic encephalopathy.  EEG did not show any seizure activity.  Further work-up was done which included MRI of the brain which did not show any acute stroke either.  Patient's COPD remained stable and she continued to require home oxygen which was 3.5 L oxygen via nasal cannula.  Vitals remained stable.  Her transfusion was on 09/07/2019 and since then her hemoglobin remained stable and in fact elevated.  No further episodes of bleeding.  She was evaluated by PT OT and they recommended home health and 24-hour supervision.  I spoke to patient's daughter [name] over the phone.  She informed me that patient's younger daughter  lives here in [place] who is providing 3 days of supervision in a week and that the older daughter [name] has traveled from [place] for temporary basis and she is going to live with the patient for at least a week to provide 24-hour care.  We have arranged bedside commode and home health care for the patient as well.  Patient is  hemodynamically stable.  She is going to be discharged today.  Of note, back in July 2020, cardiology had suggested switching her Brilinta to Plavix if she were to have complications such as bleeding and following their recommendations, patient is being discharged on Plavix.  Patient is not taking her aspirin.  I have advised the daughter to continue to hold aspirin and continue taking Plavix for now for at least 1 to 2 weeks and then consult with cardiology to see if she also needs to take aspirin along with Plavix.  Daughter verbalizes understanding.  Discharge Diagnoses:  Active Problems:   Hypothyroidism   Hyperlipidemia with target LDL less than 70   Acute blood loss anemia   HTN (hypertension)   Macrocytic anemia   Jerking   Fall at home, initial encounter   History of CVA (cerebrovascular accident)   History of breast cancer   DNR (do not resuscitate)   Lower GI bleeding    Discharge Instructions  Discharge Instructions    Discharge patient   Complete by: As directed    Discharge disposition: 06-Home-Health Care Svc   Discharge patient date: 09/09/2019     Allergies as of 09/09/2019      Reactions   Doxycycline Other (See Comments)   GI upset   Sulfadiazine Other (See Comments)   REACTION: Fever, aches   Sulfa Antibiotics Rash      Medication List    STOP taking these medications   ticagrelor 90 MG Tabs tablet Commonly known as: BRILINTA     TAKE these medications   alendronate 70 MG tablet Commonly known as: Fosamax Take 1 tablet (70 mg total) by mouth once a week. Take with a full glass of water on an empty stomach. What changed: additional instructions   ALPRAZolam 1 MG tablet Commonly known as: XANAX TAKE 1/2 TABLET BY MOUTH 3 TIMES DAILY AS NEEDED FOR ANXIETY. What changed:   how much to take  how to take this  when to take this  reasons to take this  additional instructions   anastrozole 1 MG tablet Commonly known as: ARIMIDEX Take 1 mg by  mouth daily.   bisoprolol 5 MG tablet Commonly known as: ZEBETA Take 0.5 tablets (2.5 mg total) by mouth daily.   CALTRATE 600 PO Take 600 mg by mouth daily.   cholecalciferol 25 MCG (1000 UNIT) tablet Commonly known as: VITAMIN D Take 1 tablet (1,000 Units total) by mouth daily.   clopidogrel 75 MG tablet Commonly known as: PLAVIX Take 1 tablet (75 mg total) by mouth daily. Start taking on: September 10, 2019   diclofenac sodium 1 % Gel Commonly known as: VOLTAREN APPLY 4 GRAMS TO AFFECTED AREA 2 TIMES A DAY. What changed: See the new instructions.   DULoxetine 60 MG capsule Commonly known as: CYMBALTA TAKE 1 CAPSULE BY MOUTH DAILY   ferrous sulfate 325 (65 FE) MG tablet Commonly known as: FerrouSul Take 1 tablet (325 mg total) by mouth daily with breakfast.   gabapentin 300 MG capsule Commonly known as: NEURONTIN 2 tabs in the AM and 1 tab in the afternoon   hydroxypropyl  methylcellulose / hypromellose 2.5 % ophthalmic solution Commonly known as: ISOPTO TEARS / GONIOVISC Place 1 drop into both eyes 3 (three) times daily as needed for dry eyes.   levothyroxine 112 MCG tablet Commonly known as: SYNTHROID TAKE 1 TABLET BY MOUTH DAILY BEFORE BREAKFAST. What changed: See the new instructions.   losartan 25 MG tablet Commonly known as: COZAAR Take 0.5 tablets (12.5 mg total) by mouth daily.   multivitamin with minerals Tabs tablet Take 1 tablet by mouth daily.   nitroGLYCERIN 0.4 MG SL tablet Commonly known as: NITROSTAT Place 1 tablet (0.4 mg total) under the tongue every 5 (five) minutes as needed for chest pain.   OCUVITE PRESERVISION PO Take 1 tablet by mouth daily.   omeprazole 20 MG capsule Commonly known as: PRILOSEC TAKE 1 CAPSULE BY MOUTH 2 TIMES DAILY BEFORE A MEAL. What changed: See the new instructions.   OXYGEN Inhale 3-3.5 L into the lungs continuous.   ProAir HFA 108 (90 Base) MCG/ACT inhaler Generic drug: albuterol INHALE 1 TO 2 PUFFS INTO  THE LUNGS EVERY 6 HOURS AS NEEDED FOR WHEEZING OR SHORTNESS OF BREATH. What changed: See the new instructions.   rosuvastatin 10 MG tablet Commonly known as: Crestor Take 1 tablet (10 mg total) by mouth daily.   Spiriva HandiHaler 18 MCG inhalation capsule Generic drug: tiotropium PLACE 1 CAPSULE INTO INHALER AND INHALE ONCE A DAY What changed:   how much to take  how to take this  when to take this  additional instructions   traZODone 50 MG tablet Commonly known as: DESYREL Take 3 tablets (150 mg total) by mouth at bedtime as needed for sleep.            Durable Medical Equipment  (From admission, onward)         Start     Ordered   09/09/19 1055  For home use only DME Bedside commode  Once    Question:  Patient needs a bedside commode to treat with the following condition  Answer:  Balance disorder   09/09/19 1054         Follow-up Information    Tonia Ghent, MD Follow up in 1 week(s).   Specialty: Family Medicine Contact information: [place] [address] [phone]        Belva Crome, MD .   Specialty: Cardiology Contact information: [phone] [place] [address] [phone]          Allergies  Allergen Reactions  . Doxycycline Other (See Comments)    GI upset  . Sulfadiazine Other (See Comments)    REACTION: Fever, aches  . Sulfa Antibiotics Rash    Consultations: None   Procedures/Studies: CT HEAD WO CONTRAST  Result Date: 09/07/2019 CLINICAL DATA:  Recent fall with altered mental status EXAM: CT HEAD WITHOUT CONTRAST TECHNIQUE: Contiguous axial images were obtained from the base of the skull through the vertex without intravenous contrast. COMPARISON:  01/23/2017 FINDINGS: Brain: Chronic atrophic and ischemic changes are again identified and stable. No findings to suggest acute hemorrhage, acute infarction or space-occupying mass lesion is noted. Vascular: No hyperdense vessel  or unexpected calcification. Skull: Normal. Negative for fracture or focal lesion. Sinuses/Orbits: Mild mucosal thickening is noted within the ethmoid, frontal right maxillary sinuses. Other: Bilateral subdural hygromas are stable in appearance from exam. IMPRESSION: Chronic atrophic and ischemic changes without acute abnormality. Electronically Signed   By: Inez Catalina M.D.   On:  09/07/2019 03:17   MR BRAIN WO CONTRAST  Result Date: 09/07/2019 CLINICAL DATA:  Stroke follow-up.  History of breast cancer. EXAM: MRI HEAD WITHOUT CONTRAST TECHNIQUE: Multiplanar, multiecho pulse sequences of the brain and surrounding structures were obtained without intravenous contrast. COMPARISON:  CT head 09/07/2019 FINDINGS: Brain: Negative for acute infarct. Moderate to extensive chronic microvascular ischemic change in the white matter and pons. Small chronic infarcts in the cerebellum bilaterally. Generalized atrophy. 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 Negative for hemorrhage or mass. No midline shift. Vascular: Normal arterial flow voids Skull and upper cervical spine: No focal skeletal lesion. Sinuses/Orbits: Mucosal edema paranasal sinuses. Bilateral mastoid effusion. Bilateral cataract extraction Other: None IMPRESSION: Moderate atrophy and moderate to advanced chronic ischemic change. No acute infarct. Sinus mucosal disease.  Bilateral mastoid effusion. Electronically Signed   By: Franchot Gallo M.D.   On: 09/07/2019 13:15   DG Pelvis Portable  Result Date: 09/07/2019 CLINICAL DATA:  Left hip pain EXAM: PORTABLE PELVIS 1-2 VIEWS COMPARISON:  None. FINDINGS: There is no evidence of pelvic fracture or diastasis. There is diffuse osteopenia. The sacroiliac joints appear to be intact. Degenerative changes in the lower lumbar spine. Moderate bilateral hip osteoarthritis. IMPRESSION: No definite acute fracture.  Moderate bilateral hip osteoarthritis.  Electronically Signed   By: Prudencio Pair M.D.   On: 09/07/2019 05:16   DG Chest Port 1 View  Result Date: 09/07/2019 CLINICAL DATA:  Chest pain, history of recent fall EXAM: PORTABLE CHEST 1 VIEW COMPARISON:  12/27/2018 FINDINGS: Cardiac shadow is within normal limits. Tortuosity of the thoracic aorta is noted accentuated by patient rotation to the right. No focal infiltrate or sizable effusion is seen. Postsurgical changes in the right shoulder are noted. No acute bony abnormality is seen. IMPRESSION: No acute abnormality noted. Electronically Signed   By: Inez Catalina M.D.   On: 09/07/2019 02:50   EEG adult  Result Date: 09/07/2019 Lora Havens, MD     09/07/2019  5:05 PM Patient Name: Jesseca Bullard MRN: [id number] Epilepsy Attending: Lora Havens Referring Physician/Provider: Dr Fuller Plan Date: 09/07/2019 Duration: 26.38 mins Patient history: 86yo F with worsening encephalopathy and tremor like movements. EEG to evaluate for seizure Level of alertness: awake AEDs during EEG study: None Technical aspects: This EEG study was done with scalp electrodes positioned according to the 10-20 International system of electrode placement. Electrical activity was acquired at a sampling rate of 500Hz  and reviewed with a high frequency filter of 70Hz  and a low frequency filter of 1Hz . EEG data were recorded continuously and digitally stored. DESCRIPTION:  The posterior dominant rhythm consists of 7.5 Hz activity of moderate voltage (25-35 uV) seen predominantly in posterior head regions, symmetric and reactive to eye opening and eye closing.        Intermittent generalized 5-7Hz  theta activity was also noted.  Hyperventilation and photic stimulation were not performed. ABNORMALITY - Intermittent slow, generalized IMPRESSION: This study is suggestive of mild diffuse encephalopathy, non specific to etiology. No seizures or epileptiform discharges were seen throughout the recording. Lora Havens       Discharge Exam: Vitals:   09/09/19 0558 09/09/19 0807  BP: (!) 149/69   Pulse: (!) 55 61  Resp: 17 17  Temp: 98 F (36.7 C)   SpO2: 99% 99%   Vitals:   09/08/19 1837 09/09/19 0015 09/09/19 0558 09/09/19 0807  BP: (!) 138/50 (!) 118/59 (!) 149/69  Pulse: 63 (!) 58 (!) 55 61  Resp: 16 17 17 17   Temp: 99.1 F (37.3 C) 98.2 F (36.8 C) 98 F (36.7 C)   TempSrc: Oral Oral Oral   SpO2: 97% 98% 99% 99%  Weight:      Height:        General: Pt is alert, awake, not in acute distress Cardiovascular: RRR, S1/S2 +, no rubs, no gallops Respiratory: CTA bilaterally, no wheezing, no rhonchi Abdominal: Soft, NT, ND, bowel sounds + Extremities: no edema, no cyanosis    The results of significant diagnostics from this hospitalization (including imaging, microbiology, ancillary and laboratory) are listed below for reference.     Microbiology: Recent Results (from the past 240 hour(s))  SARS CORONAVIRUS 2 (TAT 6-24 HRS) Nasopharyngeal Nasopharyngeal Swab     Status: None   Collection Time: 09/07/19  4:10 AM   Specimen: Nasopharyngeal Swab  Result Value Ref Range Status   SARS Coronavirus 2 NEGATIVE NEGATIVE Final    Comment: (NOTE) SARS-CoV-2 target nucleic acids are NOT DETECTED. The SARS-CoV-2 RNA is generally detectable in upper and lower respiratory specimens during the acute phase of infection. Negative results do not preclude SARS-CoV-2 infection, do not rule out co-infections with other pathogens, and should not be used as the sole basis for treatment or other patient management decisions. Negative results must be combined with clinical observations, patient history, and epidemiological information. The expected result is Negative. Fact Sheet for Patients: SugarRoll.be Fact Sheet for Healthcare Providers: https://www.woods-mathews.com/ This test is not yet approved or cleared by the [place] FDA and  has been  authorized for detection and/or diagnosis of SARS-CoV-2 by FDA under an Emergency Use Authorization (EUA). This EUA will remain  in effect (meaning this test can be used) for the duration of the COVID-19 declaration under Section 56 4(b)(1) of the Act, 21 U.S.C. section 360bbb-3(b)(1), unless the authorization is terminated or revoked sooner. Performed at [facility], [place] [address] [place], [place] [address]      Labs: BNP (last 3 results) No results for input(s): BNP in the last 8760 hours. Basic Metabolic Panel: Recent Labs  Lab 09/07/19 0310 09/08/19 0134 09/09/19 0506  NA 145 142 141  K 3.6 3.6 3.3*  CL 99 99 99  CO2 39* 38* 34*  GLUCOSE 111* 98 90  BUN 13 9 8   CREATININE 0.64 0.55 0.52  CALCIUM 8.9 8.7* 8.7*   Liver Function Tests: Recent Labs  Lab 09/07/19 0310  AST 23  ALT 10  ALKPHOS 29*  BILITOT 0.4  PROT 5.8*  ALBUMIN 3.1*   No results for input(s): LIPASE, AMYLASE in the last 168 hours. No results for input(s): AMMONIA in the last 168 hours. CBC: Recent Labs  Lab 09/07/19 0310 09/07/19 1353 09/08/19 0134 09/09/19 0506  WBC 4.2  --  4.1 3.6*  NEUTROABS 2.4  --   --   --   HGB 6.8* 7.0* 8.8* 9.5*  HCT 23.6* 23.6* 28.5* 30.6*  MCV 105.4*  --  96.6 94.4  PLT 187  --  160 154   Cardiac Enzymes: Recent Labs  Lab 09/09/19 0506  CKTOTAL 61   BNP: Invalid input(s): POCBNP CBG: No results for input(s): GLUCAP in the last 168 hours. D-Dimer No results for input(s): DDIMER in the last 72 hours. Hgb A1c No results for input(s): HGBA1C in the last 72 hours. Lipid Profile No results for input(s): CHOL, HDL, LDLCALC, TRIG, CHOLHDL, LDLDIRECT in the last 72 hours. Thyroid  function studies Recent Labs    09/07/19 1045  TSH 3.577   Anemia work up Recent Labs    09/07/19 1045  VITAMINB12 336  FOLATE 19.3  FERRITIN 20  TIBC 316  IRON 46  RETICCTPCT 1.5   Urinalysis    Component Value Date/Time   COLORURINE YELLOW 09/07/2019  0520   APPEARANCEUR CLEAR 09/07/2019 0520   LABSPEC 1.019 09/07/2019 0520   PHURINE 5.0 09/07/2019 0520   GLUCOSEU NEGATIVE 09/07/2019 0520   GLUCOSEU NEGATIVE 10/24/2016 1429   HGBUR NEGATIVE 09/07/2019 0520   BILIRUBINUR NEGATIVE 09/07/2019 0520   BILIRUBINUR negative 09/20/2016 1625   KETONESUR NEGATIVE 09/07/2019 0520   PROTEINUR NEGATIVE 09/07/2019 0520   UROBILINOGEN 0.2 10/24/2016 1429   NITRITE NEGATIVE 09/07/2019 0520   LEUKOCYTESUR NEGATIVE 09/07/2019 0520   Sepsis Labs Invalid input(s): PROCALCITONIN,  WBC,  LACTICIDVEN Microbiology Recent Results (from the past 240 hour(s))  SARS CORONAVIRUS 2 (TAT 6-24 HRS) Nasopharyngeal Nasopharyngeal Swab     Status: None   Collection Time: 09/07/19  4:10 AM   Specimen: Nasopharyngeal Swab  Result Value Ref Range Status   SARS Coronavirus 2 NEGATIVE NEGATIVE Final    Comment: (NOTE) SARS-CoV-2 target nucleic acids are NOT DETECTED. The SARS-CoV-2 RNA is generally detectable in upper and lower respiratory specimens during the acute phase of infection. Negative results do not preclude SARS-CoV-2 infection, do not rule out co-infections with other pathogens, and should not be used as the sole basis for treatment or other patient management decisions. Negative results must be combined with clinical observations, patient history, and epidemiological information. The expected result is Negative. Fact Sheet for Patients: SugarRoll.be Fact Sheet for Healthcare Providers: https://www.woods-mathews.com/ This test is not yet approved or cleared by the [place] FDA and  has been authorized for detection and/or diagnosis of SARS-CoV-2 by FDA under an Emergency Use Authorization (EUA). This EUA will remain  in effect (meaning this test can be used) for the duration of the COVID-19 declaration under Section 56 4(b)(1) of the Act, 21 U.S.C. section 360bbb-3(b)(1), unless the authorization is  terminated or revoked sooner. Performed at [facility], [place] [address], [place], [place] [address]      Time coordinating discharge: Over 30 minutes  SIGNED:   Darliss Cheney, MD  [facility] 09/09/2019, 10:57 AM  If 7PM-7AM, please contact night-coverage [email or web address]

## 2019-09-09 NOTE — Discharge Instructions (Signed)
Anemia  Anemia is a condition in which you do not have enough red blood cells or hemoglobin. Hemoglobin is a substance in red blood cells that carries oxygen. When you do not have enough red blood cells or hemoglobin (are anemic), your body cannot get enough oxygen and your organs may not work properly. As a result, you may feel very tired or have other problems. What are the causes? Common causes of anemia include:  Excessive bleeding. Anemia can be caused by excessive bleeding inside or outside the body, including bleeding from the intestine or from periods in women.  Poor nutrition.  Long-lasting (chronic) kidney, thyroid, and liver disease.  Bone marrow disorders.  Cancer and treatments for cancer.  HIV (human immunodeficiency virus) and AIDS (acquired immunodeficiency syndrome).  Treatments for HIV and AIDS.  Spleen problems.  Blood disorders.  Infections, medicines, and autoimmune disorders that destroy red blood cells. What are the signs or symptoms? Symptoms of this condition include:  Minor weakness.  Dizziness.  Headache.  Feeling heartbeats that are irregular or faster than normal (palpitations).  Shortness of breath, especially with exercise.  Paleness.  Cold sensitivity.  Indigestion.  Nausea.  Difficulty sleeping.  Difficulty concentrating. Symptoms may occur suddenly or develop slowly. If your anemia is mild, you may not have symptoms. How is this diagnosed? This condition is diagnosed based on:  Blood tests.  Your medical history.  A physical exam.  Bone marrow biopsy. Your health care provider may also check your stool (feces) for blood and may do additional testing to look for the cause of your bleeding. You may also have other tests, including:  Imaging tests, such as a CT scan or MRI.  Endoscopy.  Colonoscopy. How is this treated? Treatment for this condition depends on the cause. If you continue to lose a lot of blood, you may  need to be treated at a hospital. Treatment may include:  Taking supplements of iron, vitamin M08, or folic acid.  Taking a hormone medicine (erythropoietin) that can help to stimulate red blood cell growth.  Having a blood transfusion. This may be needed if you lose a lot of blood.  Making changes to your diet.  Having surgery to remove your spleen. Follow these instructions at home:  Take over-the-counter and prescription medicines only as told by your health care provider.  Take supplements only as told by your health care provider.  Follow any diet instructions that you were given.  Keep all follow-up visits as told by your health care provider. This is important. Contact a health care provider if:  You develop new bleeding anywhere in the body. Get help right away if:  You are very weak.  You are short of breath.  You have pain in your abdomen or chest.  You are dizzy or feel faint.  You have trouble concentrating.  You have bloody or black, tarry stools.  You vomit repeatedly or you vomit up blood. Summary  Anemia is a condition in which you do not have enough red blood cells or enough of a substance in your red blood cells that carries oxygen (hemoglobin).  Symptoms may occur suddenly or develop slowly.  If your anemia is mild, you may not have symptoms.  This condition is diagnosed with blood tests as well as a medical history and physical exam. Other tests may be needed.  Treatment for this condition depends on the cause of the anemia. This information is not intended to replace advice given to you by  your health care provider. Make sure you discuss any questions you have with your health care provider. Document Revised: 06/07/2017 Document Reviewed: 07/27/2016 Elsevier Patient Education  [place].

## 2019-09-09 NOTE — Progress Notes (Signed)
RN called and gave pt's daughters [name] and Dante discharge instructions and they stated understanding. Packet will be sent home with pt, IV has been removed and plavix delivered at bedside.

## 2019-09-09 NOTE — TOC Initial Note (Addendum)
Transition of Care [facility]) - Initial/Assessment Note    Patient Details  Name: [name] MRN: [id number] Date of Birth: 1931-04-21  Transition of Care [facility]) CM/SW Contact:    Marilu Favre, RN Phone Number: 09/09/2019, 1:29 PM  Clinical Narrative:                 Spoke to both daughters [name] and [name], discussed PT recommendation of 24 hour supervision vs SNF. The daughters can provide 24 hour assistance at home for 2 to 3 weeks. They prefer to take their mother home today.   Patient has home oxygen with [employer].   Patient also has Rollator and the bathroom is very close by they do not want a 3 in 1.   Offered to arrange PTAR transport home, per PT note patient only ambulated 3 feet. However, daughters want to take her home in private car they have portable oxygen tank.   Provided choice for home health agency, they would like [facility], left message for [place] .  [facility] start of care date is Saturday or Sunday.   [facility] start of care is Thursday or FRiday. [name] prefers [place].   Expected Discharge Plan: [place] Barriers to Discharge: No Barriers Identified   Patient Goals and CMS Choice Patient states their goals for this hospitalization and ongoing recovery are:: to go home CMS Medicare.gov Compare Post Acute Care list provided to:: Patient Choice offered to / list presented to : Adult Children, Patient  Expected Discharge Plan and Services Expected Discharge Plan: [place]   Discharge Planning Services: CM Consult Post Acute Care Choice: [place] arrangements for the past 2 months: Single Family Home Expected Discharge Date: 09/09/19               DME Arranged: N/A DME Agency: NA       HH Arranged: PT, OT [place] Agency: [facility] (formerly [employer]) Date [place]: 09/09/19 Time [place]: [id number] Representative spoke with at [place] left  message  Prior Living Arrangements/Services Living arrangements for the past 2 months: [place] with:: Self Patient language and need for interpreter reviewed:: Yes Do you feel safe going back to the place where you live?: Yes      Need for Family Participation in Patient Care: Yes (Comment) Care giver support system in place?: Yes (comment) Current home services: Home PT, Home OT Criminal Activity/Legal Involvement Pertinent to Current Situation/Hospitalization: No - Comment as needed  Activities of Daily [place] Devices/Equipment: Walker (specify type) ADL Screening (condition at time of admission) Patient's cognitive ability adequate to safely complete daily activities?: No Is the patient deaf or have difficulty hearing?: Yes Does the patient have difficulty seeing, even when wearing glasses/contacts?: Yes Does the patient have difficulty concentrating, remembering, or making decisions?: Yes Patient able to express need for assistance with ADLs?: Yes Does the patient have difficulty dressing or bathing?: Yes Independently performs ADLs?: No Does the patient have difficulty walking or climbing stairs?: Yes Weakness of Legs: Both Weakness of Arms/Hands: Both  Permission Sought/Granted   Permission granted to share information with : Yes, Verbal Permission Granted  Share Information with NAME: [name] and [name]           Emotional Assessment   Attitude/Demeanor/Rapport: Engaged Affect (typically observed): Accepting Orientation: : Oriented to Self, Oriented to Place, Oriented to  Time, Oriented to Situation Alcohol /  Substance Use: Not Applicable Psych Involvement: No (comment)  Admission diagnosis:  Anemia [D64.9] Fall, initial encounter [W19.XXXA] Acute anemia [D64.9] Macrocytic anemia [D53.9] Patient Active Problem List   Diagnosis Date Noted  . Lower GI bleeding 09/09/2019  . DNR (do not resuscitate) 09/08/2019  . Anemia 09/07/2019  .  Macrocytic anemia 09/07/2019  . Jerking 09/07/2019  . Fall at home, initial encounter 09/07/2019  . History of CVA (cerebrovascular accident) 09/07/2019  . History of breast cancer 09/07/2019  . Pelvic pain 03/25/2019  . Dysphagia 02/18/2019  . Coronary artery disease involving native coronary artery of native heart with unstable angina pectoris ([place]) 01/30/2019  . Presence of drug coated stent in right coronary artery 01/30/2019  . Left main coronary artery disease - ~50-60% dLM (&70% dLAD) -- Plan Med Rx 01/30/2019  . Ischemic cardiomyopathy 01/30/2019  . Acute ST elevation myocardial infarction (STEMI) of inferior wall ([place]) 01/29/2019  . AAA (abdominal aortic aneurysm) ([place]) 12/31/2018  . Malignant neoplasm of upper-outer quadrant of right breast in female, estrogen receptor positive ([place]) 09/26/2018  . Breast mass 08/28/2018  . Bilateral impacted cerumen 08/14/2017  . Laryngopharyngeal reflux (LPR) 08/14/2017  . Presbycusis of both ears 08/14/2017  . Thoracic aortic aneurysm ([place]) 04/10/2017  . Abdominal pain 04/05/2017  . Syncope 01/24/2017  . Low back pain 01/01/2017  . Medicare annual wellness visit, subsequent 09/26/2016  . Hard of hearing 09/26/2016  . Vitamin D deficiency 09/26/2016  . Advance care planning 09/26/2016  . Encounter for chronic pain management 07/26/2016  . Edema 10/21/2014  . Multinodular thyroid 07/07/2013  . HTN (hypertension) 06/09/2013  . Chronic respiratory failure ([place]) 05/13/2013  . Cough 05/02/2013  . Vertigo 12/31/2012  . Acute blood loss anemia 12/13/2011  . Osteoarthritis of right knee 08/27/2011  . Gold C Copd with asthmatic bronchitis component and chronic resp failure 06/26/2011  . Neck pain 04/02/2011  . RISK OF FALLING 08/17/2010  . Hypokalemia 05/24/2009  . Anemia, unspecified 04/10/2008  . Anxiety state 03/01/2008  . Osteoporosis 06/23/2007  . Chronic pain syndrome 12/26/2006  . Hypothyroidism 12/24/2006  . Hyperlipidemia with  target LDL less than 70 12/24/2006  . COMMON MIGRAINE 12/24/2006  . GERD 12/24/2006  . DEGENERATIVE DISC DISEASE 12/24/2006  . Fibromyalgia 12/24/2006  . Insomnia 12/24/2006  . HYPERGLYCEMIA 12/24/2006   PCP:  Tonia Ghent, MD Pharmacy:   [facility] [place] [id number], [place] [address] Phone: [phone] Fax: [phone]  [place], [place] [address] Phone: [phone] Fax: [phone]  [facility] [place], [place] [address] [place] [address] Phone: [phone] Fax: [phone]     Social Determinants of Health (SDOH) Interventions    Readmission Risk Interventions No flowsheet data found.

## 2019-09-10 ENCOUNTER — Telehealth: Payer: Self-pay

## 2019-09-10 DIAGNOSIS — M810 Age-related osteoporosis without current pathological fracture: Secondary | ICD-10-CM | POA: Diagnosis not present

## 2019-09-10 DIAGNOSIS — D631 Anemia in chronic kidney disease: Secondary | ICD-10-CM | POA: Diagnosis not present

## 2019-09-10 DIAGNOSIS — N189 Chronic kidney disease, unspecified: Secondary | ICD-10-CM | POA: Diagnosis not present

## 2019-09-10 DIAGNOSIS — J449 Chronic obstructive pulmonary disease, unspecified: Secondary | ICD-10-CM | POA: Diagnosis not present

## 2019-09-10 DIAGNOSIS — M47816 Spondylosis without myelopathy or radiculopathy, lumbar region: Secondary | ICD-10-CM | POA: Diagnosis not present

## 2019-09-10 DIAGNOSIS — M160 Bilateral primary osteoarthritis of hip: Secondary | ICD-10-CM | POA: Diagnosis not present

## 2019-09-10 DIAGNOSIS — D62 Acute posthemorrhagic anemia: Secondary | ICD-10-CM | POA: Diagnosis not present

## 2019-09-10 DIAGNOSIS — M519 Unspecified thoracic, thoracolumbar and lumbosacral intervertebral disc disorder: Secondary | ICD-10-CM | POA: Diagnosis not present

## 2019-09-10 DIAGNOSIS — I129 Hypertensive chronic kidney disease with stage 1 through stage 4 chronic kidney disease, or unspecified chronic kidney disease: Secondary | ICD-10-CM | POA: Diagnosis not present

## 2019-09-10 NOTE — Telephone Encounter (Signed)
Please give the order.  Thanks.   

## 2019-09-10 NOTE — Telephone Encounter (Signed)
Sree PT with [facility] left v/m requesting verbal orders for [facility] PT for 9 wks. Also requested verbal orders for [facility] OT and speech therapy evaluation.Please advise.

## 2019-09-10 NOTE — Telephone Encounter (Signed)
Left detailed message on voicemail.  

## 2019-09-11 DIAGNOSIS — D631 Anemia in chronic kidney disease: Secondary | ICD-10-CM | POA: Diagnosis not present

## 2019-09-11 DIAGNOSIS — M519 Unspecified thoracic, thoracolumbar and lumbosacral intervertebral disc disorder: Secondary | ICD-10-CM | POA: Diagnosis not present

## 2019-09-11 DIAGNOSIS — M810 Age-related osteoporosis without current pathological fracture: Secondary | ICD-10-CM | POA: Diagnosis not present

## 2019-09-11 DIAGNOSIS — D62 Acute posthemorrhagic anemia: Secondary | ICD-10-CM | POA: Diagnosis not present

## 2019-09-11 DIAGNOSIS — M160 Bilateral primary osteoarthritis of hip: Secondary | ICD-10-CM | POA: Diagnosis not present

## 2019-09-11 DIAGNOSIS — M47816 Spondylosis without myelopathy or radiculopathy, lumbar region: Secondary | ICD-10-CM | POA: Diagnosis not present

## 2019-09-11 DIAGNOSIS — J449 Chronic obstructive pulmonary disease, unspecified: Secondary | ICD-10-CM | POA: Diagnosis not present

## 2019-09-11 DIAGNOSIS — N189 Chronic kidney disease, unspecified: Secondary | ICD-10-CM | POA: Diagnosis not present

## 2019-09-11 DIAGNOSIS — I129 Hypertensive chronic kidney disease with stage 1 through stage 4 chronic kidney disease, or unspecified chronic kidney disease: Secondary | ICD-10-CM | POA: Diagnosis not present

## 2019-09-13 IMAGING — CT CT ABDOMEN AND PELVIS WITH CONTRAST
2 of 5 series · 15 of 46 positions shown, 17 images · IV contrast (ISOVUE)
Comparison: 06/08/2013 CT angiogram of the chest, abdomen and
pelvis.

CLINICAL DATA: Acute generalized abdominal pain.

EXAM:
CT ABDOMEN AND PELVIS WITH CONTRAST
TECHNIQUE: Multidetector CT imaging of the abdomen and pelvis was performed
using the standard protocol following bolus administration of
intravenous contrast.
CONTRAST:  100mL OMNIPAQUE IOHEXOL 300 MG/ML  SOLN

[Series 2: axial st · axial · 0.79mm/px · z∈[-502,-157]mm · 12 of 79 slices shown, 14 images]
[im 5/79  soft-tissue]
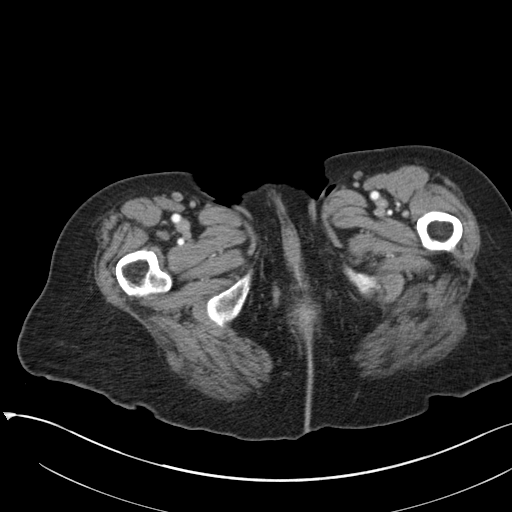
[im 5/79  bone]
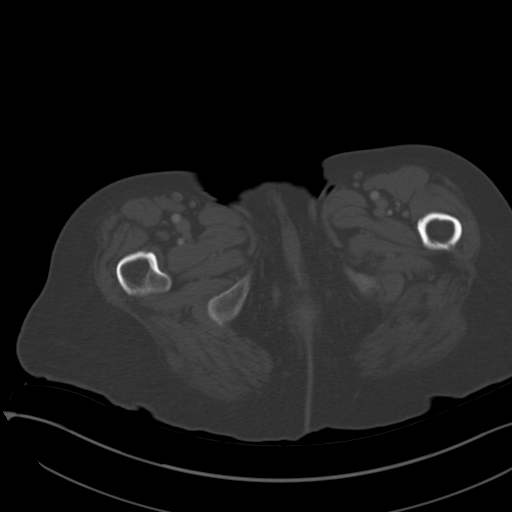
[im 10/79  soft-tissue]
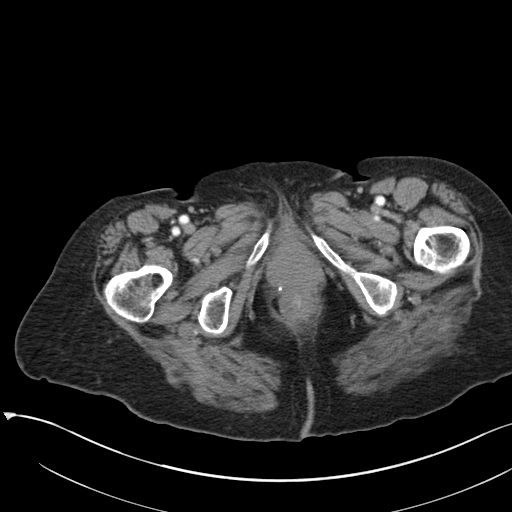
[im 20/79  soft-tissue]
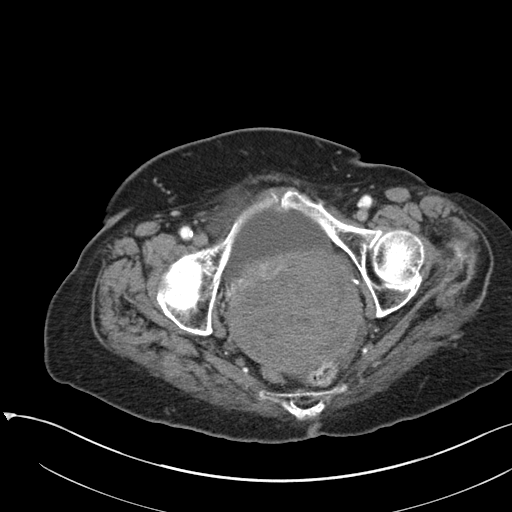
[im 25/79  soft-tissue]
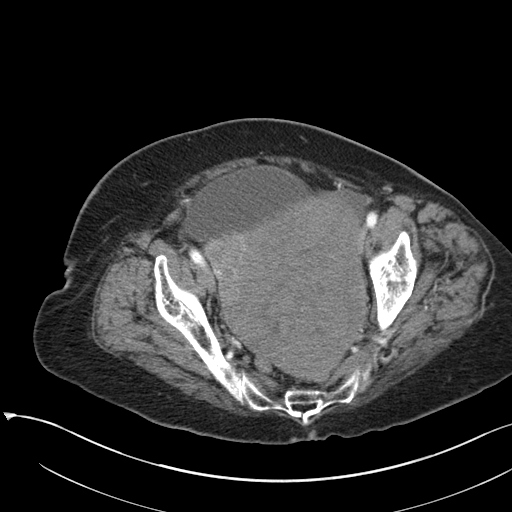
[im 30/79  soft-tissue]
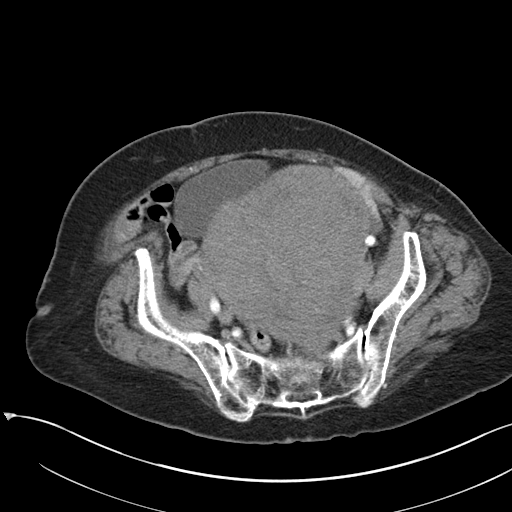
[im 35/79  soft-tissue]
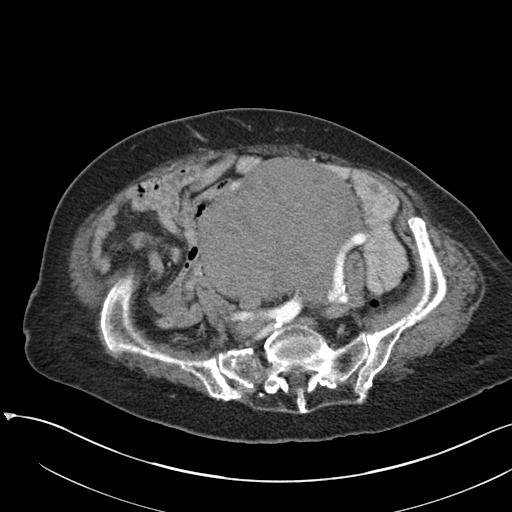
[im 44/79  soft-tissue]
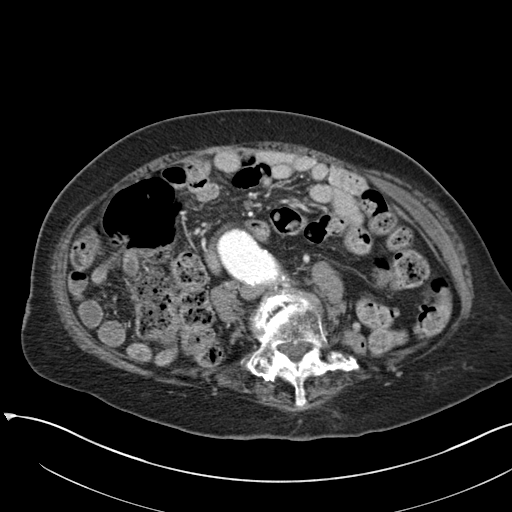
[im 49/79  soft-tissue]
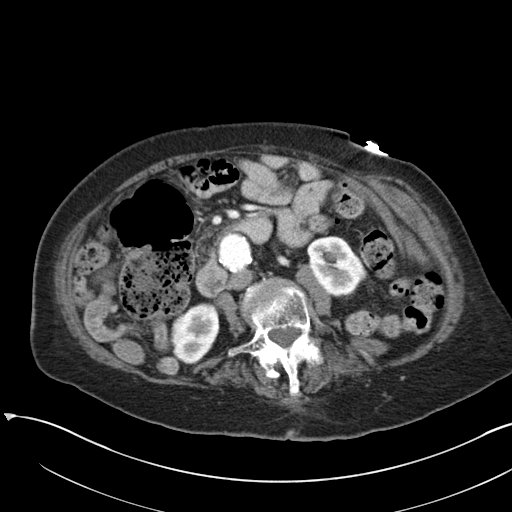
[im 54/79  soft-tissue]
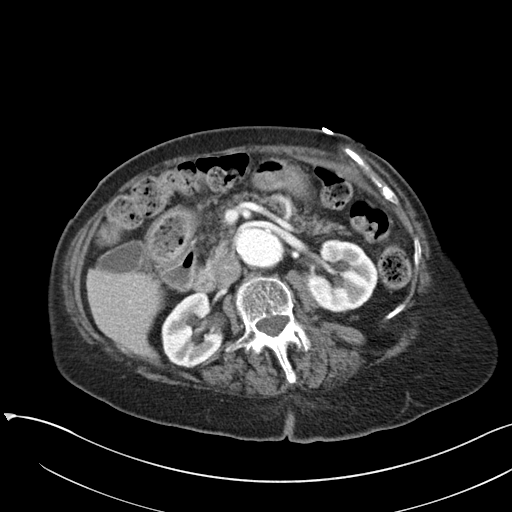
[im 54/79  bone]
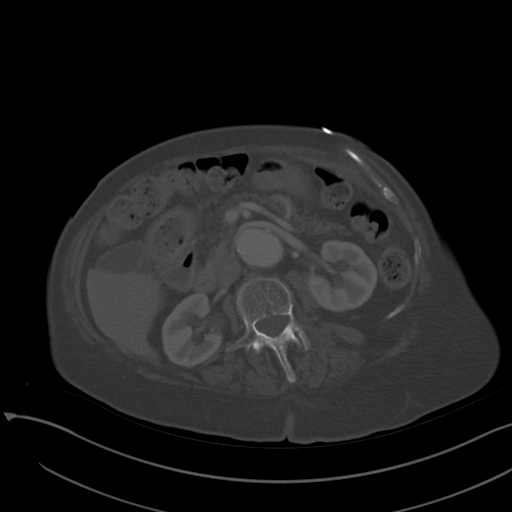
[im 59/79  soft-tissue]
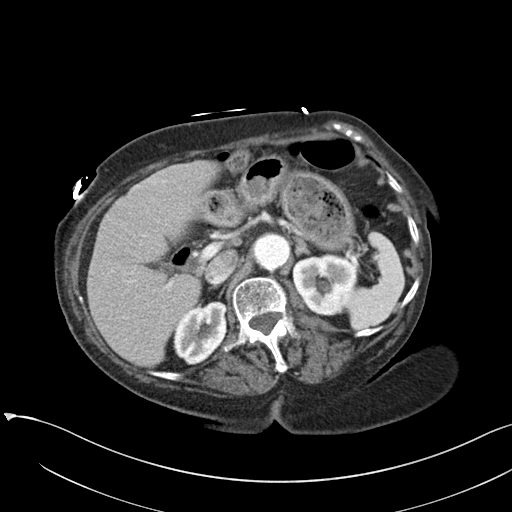
[im 69/79  soft-tissue]
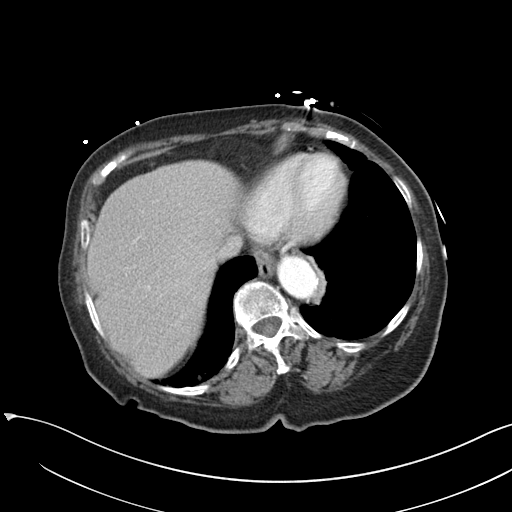
[im 74/79  soft-tissue]
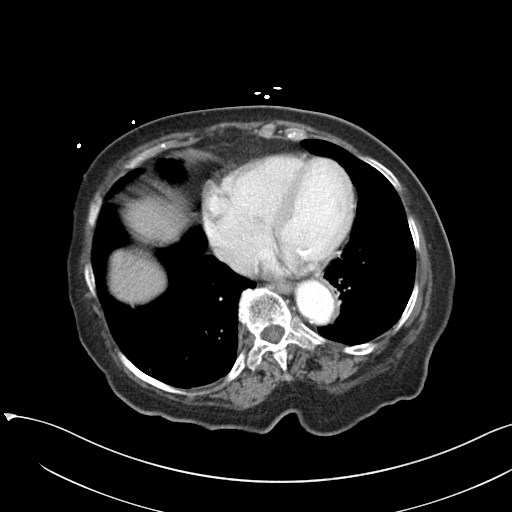

[Series 5: coronal st · coronal · 0.75mm/px · 3 of 137 slices shown]
[im 46/137  soft-tissue]
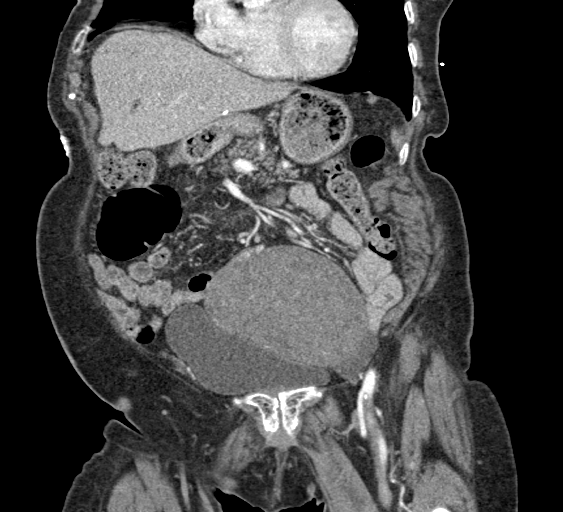
[im 61/137  soft-tissue]
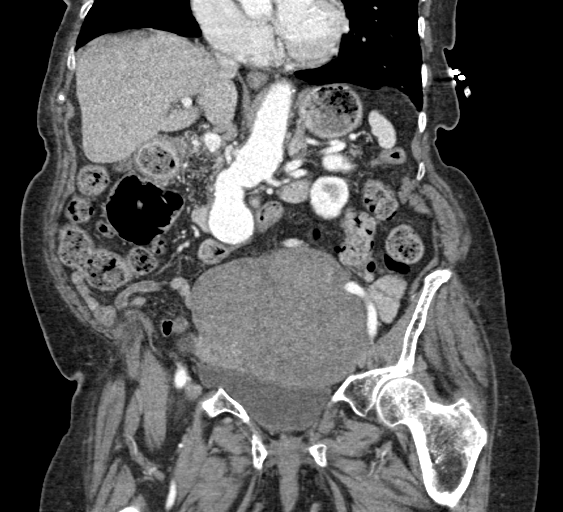
[im 76/137  soft-tissue]
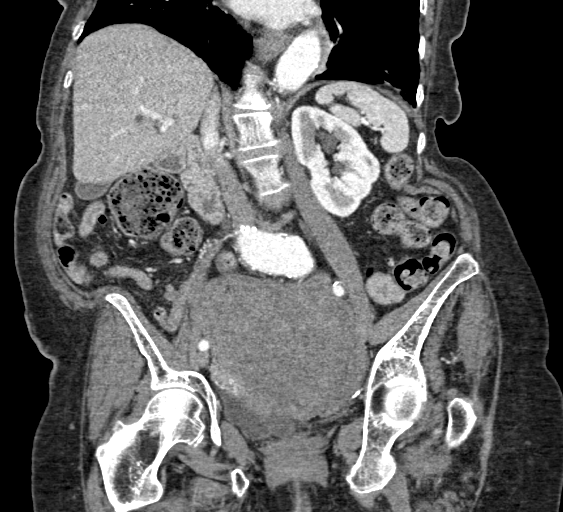

[15 of 46 positions shown; findings below may reference images not displayed]

FINDINGS: Lower chest: Patchy tree-in-bud opacities in the basilar left lower
lobe. Coronary atherosclerosis.

Hepatobiliary: Liver surface appears finely irregular, cannot
exclude hepatic cirrhosis. No liver masses. Normal gallbladder with
no radiopaque cholelithiasis. No biliary ductal dilatation.

Pancreas: Normal, with no mass or duct dilation.

Spleen: Normal size. No mass.

Adrenals/Urinary Tract: Normal adrenals. Subcentimeter hypodense
renal cortical lesions in the posterior left kidney, too small to
characterize, requiring no follow-up. Otherwise normal kidneys, with
no hydronephrosis. Normal bladder.

Stomach/Bowel: Suggestion of mild wall thickening in the body and
antrum of the stomach. Stomach is nondistended. Normal caliber small
bowel with no small bowel wall thickening. Normal appendix. Tortuous
large bowel with moderate colonic stool volume. Mild left colonic
diverticulosis, with no large bowel wall thickening or significant
pericolonic fat stranding.

Vascular/Lymphatic: Atherosclerotic abdominal aorta with 3.9 cm
infrarenal abdominal aortic aneurysm, increased from 3.3 cm in 4245
CT. Patent portal, splenic, hepatic and renal veins. No
pathologically enlarged lymph nodes in the abdomen or pelvis.

Reproductive: Markedly enlarged heterogeneous uterus measures 18.4 x
9.3 x 13.0 cm, previously 18.5 x 9.1 x 12.6 cm, not appreciably
changed since 06/08/2013 CT. No adnexal masses.

Other: No pneumoperitoneum, ascites or focal fluid collection.

Musculoskeletal: No aggressive appearing focal osseous lesions.
Marked thoracolumbar spondylosis. Stable chronic mild T11 vertebral
compression fracture.
IMPRESSION: 1. Suggestion of mild wall thickening in the body and antrum of the
stomach, cannot exclude gastritis or other etiologies.
2. No evidence of bowel obstruction or acute bowel inflammation.
Normal appendix. Moderate colonic stool volume suggests
constipation. Mild left colonic diverticulosis, with no evidence of
acute diverticulitis.
3. Patchy tree-in-bud opacities at the left lung base, indicative
nonspecific bronchiolitis, with the differential including
aspiration.
4. Increased 3.9 cm infrarenal abdominal Aortic Aneurysm
(6W1VX-161.9). Recommend follow-up aortic ultrasound in 2 years.
This recommendation follows ACR consensus guidelines: White Paper of
the ACR Incidental Findings Committee II on Vascular Findings. [HOSPITAL] 1091; [DATE].
5. Finely irregular liver surface, cannot exclude appendix
cirrhosis. No liver masses. Consider hepatic elastography for
further liver fibrosis risk stratification, as clinically warranted.
6. Markedly enlarged heterogeneous uterus, unchanged since 4245,
presumably myomatous uterus.
7.  Aortic Atherosclerosis (6W1VX-2SI.I).

## 2019-09-14 ENCOUNTER — Emergency Department (HOSPITAL_COMMUNITY): Payer: Medicare PPO

## 2019-09-14 ENCOUNTER — Other Ambulatory Visit: Payer: Self-pay

## 2019-09-14 ENCOUNTER — Emergency Department (HOSPITAL_COMMUNITY)
Admission: EM | Admit: 2019-09-14 | Discharge: 2019-09-14 | Disposition: A | Discharge status: Home / Self Care | Payer: Medicare PPO | Attending: Emergency Medicine | Admitting: Emergency Medicine

## 2019-09-14 ENCOUNTER — Encounter (HOSPITAL_COMMUNITY): Payer: Self-pay | Admitting: Emergency Medicine

## 2019-09-14 ENCOUNTER — Telehealth: Payer: Self-pay | Admitting: *Deleted

## 2019-09-14 DIAGNOSIS — I2510 Atherosclerotic heart disease of native coronary artery without angina pectoris: Secondary | ICD-10-CM | POA: Insufficient documentation

## 2019-09-14 DIAGNOSIS — R531 Weakness: Secondary | ICD-10-CM | POA: Insufficient documentation

## 2019-09-14 DIAGNOSIS — R630 Anorexia: Secondary | ICD-10-CM

## 2019-09-14 DIAGNOSIS — R072 Precordial pain: Secondary | ICD-10-CM | POA: Diagnosis not present

## 2019-09-14 DIAGNOSIS — R079 Chest pain, unspecified: Secondary | ICD-10-CM

## 2019-09-14 DIAGNOSIS — Z79899 Other long term (current) drug therapy: Secondary | ICD-10-CM | POA: Insufficient documentation

## 2019-09-14 DIAGNOSIS — R58 Hemorrhage, not elsewhere classified: Secondary | ICD-10-CM | POA: Diagnosis not present

## 2019-09-14 DIAGNOSIS — Z66 Do not resuscitate: Secondary | ICD-10-CM | POA: Diagnosis not present

## 2019-09-14 DIAGNOSIS — E039 Hypothyroidism, unspecified: Secondary | ICD-10-CM | POA: Insufficient documentation

## 2019-09-14 DIAGNOSIS — I712 Thoracic aortic aneurysm, without rupture: Secondary | ICD-10-CM | POA: Diagnosis not present

## 2019-09-14 DIAGNOSIS — E162 Hypoglycemia, unspecified: Secondary | ICD-10-CM | POA: Diagnosis not present

## 2019-09-14 DIAGNOSIS — Z87891 Personal history of nicotine dependence: Secondary | ICD-10-CM | POA: Diagnosis not present

## 2019-09-14 DIAGNOSIS — Z96651 Presence of right artificial knee joint: Secondary | ICD-10-CM | POA: Insufficient documentation

## 2019-09-14 DIAGNOSIS — Z96611 Presence of right artificial shoulder joint: Secondary | ICD-10-CM | POA: Insufficient documentation

## 2019-09-14 DIAGNOSIS — E161 Other hypoglycemia: Secondary | ICD-10-CM | POA: Diagnosis not present

## 2019-09-14 DIAGNOSIS — R0789 Other chest pain: Secondary | ICD-10-CM | POA: Diagnosis not present

## 2019-09-14 DIAGNOSIS — Z7902 Long term (current) use of antithrombotics/antiplatelets: Secondary | ICD-10-CM | POA: Diagnosis not present

## 2019-09-14 DIAGNOSIS — N189 Chronic kidney disease, unspecified: Secondary | ICD-10-CM | POA: Insufficient documentation

## 2019-09-14 DIAGNOSIS — Z8673 Personal history of transient ischemic attack (TIA), and cerebral infarction without residual deficits: Secondary | ICD-10-CM | POA: Insufficient documentation

## 2019-09-14 DIAGNOSIS — I129 Hypertensive chronic kidney disease with stage 1 through stage 4 chronic kidney disease, or unspecified chronic kidney disease: Secondary | ICD-10-CM | POA: Insufficient documentation

## 2019-09-14 DIAGNOSIS — R638 Other symptoms and signs concerning food and fluid intake: Secondary | ICD-10-CM | POA: Diagnosis not present

## 2019-09-14 DIAGNOSIS — J449 Chronic obstructive pulmonary disease, unspecified: Secondary | ICD-10-CM | POA: Diagnosis not present

## 2019-09-14 DIAGNOSIS — J45909 Unspecified asthma, uncomplicated: Secondary | ICD-10-CM | POA: Diagnosis not present

## 2019-09-14 LAB — URINALYSIS, ROUTINE W REFLEX MICROSCOPIC
Bilirubin Urine: NEGATIVE
Glucose, UA: NEGATIVE mg/dL
Hgb urine dipstick: NEGATIVE
Ketones, ur: NEGATIVE mg/dL
Leukocytes,Ua: NEGATIVE
Nitrite: NEGATIVE
Protein, ur: NEGATIVE mg/dL
Specific Gravity, Urine: 1.02 (ref 1.005–1.030)
pH: 6 (ref 5.0–8.0)

## 2019-09-14 LAB — BASIC METABOLIC PANEL
Anion gap: 7 (ref 5–15)
BUN: 13 mg/dL (ref 8–23)
CO2: 35 mmol/L — ABNORMAL HIGH (ref 22–32)
Calcium: 8.8 mg/dL — ABNORMAL LOW (ref 8.9–10.3)
Chloride: 99 mmol/L (ref 98–111)
Creatinine, Ser: 0.56 mg/dL (ref 0.44–1.00)
GFR calc Af Amer: >60 mL/min (ref >60)
GFR calc non Af Amer: >60 mL/min (ref >60)
Glucose, Bld: 75 mg/dL (ref 70–99)
Potassium: 4.1 mmol/L (ref 3.5–5.1)
Sodium: 141 mmol/L (ref 135–145)

## 2019-09-14 LAB — TROPONIN I (HIGH SENSITIVITY)
Troponin I (High Sensitivity): 12 ng/L (ref <18)
Troponin I (High Sensitivity): 11 ng/L (ref <18)

## 2019-09-14 LAB — CBC
HCT: 33.4 % — ABNORMAL LOW (ref 36.0–46.0)
Hemoglobin: 10 g/dL — ABNORMAL LOW (ref 12.0–15.0)
MCH: 29.4 pg (ref 26.0–34.0)
MCHC: 29.9 g/dL — ABNORMAL LOW (ref 30.0–36.0)
MCV: 98.2 fL (ref 80.0–100.0)
Platelets: 166 10*3/uL (ref 150–400)
RBC: 3.4 MIL/uL — ABNORMAL LOW (ref 3.87–5.11)
RDW: 14.4 % (ref 11.5–15.5)
WBC: 3.8 10*3/uL — ABNORMAL LOW (ref 4.0–10.5)
nRBC: 0 % (ref 0.0–0.2)

## 2019-09-14 MED ORDER — LACTATED RINGERS IV BOLUS
500.0000 mL | Freq: Once | INTRAVENOUS | Status: AC
  Administered 2019-09-14: 500 mL via INTRAVENOUS

## 2019-09-14 MED ORDER — IOHEXOL 300 MG/ML  SOLN
75.0000 mL | Freq: Once | INTRAMUSCULAR | Status: AC | PRN
  Administered 2019-09-14: 75 mL via INTRAVENOUS

## 2019-09-14 MED ORDER — SODIUM CHLORIDE 0.9% FLUSH: 3.0000 mL | Freq: Once | INTRAVENOUS | Status: DC

## 2019-09-14 NOTE — ED Notes (Signed)
pts daughter [name] [phone] would like a call back

## 2019-09-14 NOTE — Telephone Encounter (Signed)
Patient's daughter [name] called stating that her mom was released from the hospital last Wednesday. [name] stated that her mom was doing better when she was released from the hospital, but started to get weaker again yesterday, [name] stated that her mom had some chest pressure yesterday and took a nitroglycerin which did help. [name] stated that her mom is complaining of back pain and she knows that can be related to a heart attack in women.[name] stated that her mom had a heart attack last year.[name] stated that her mom sleeps a lot. [name] stated that her mom sleeps late in the mornings gets up for a few hours and then takes a nap. [name] stated that her mom then wants to stay up all night. [name] stated that her mom is complaining of a lot of pain all over and wants to know if Dr. Damita Dunnings can prescribe her a low dose pain medication? [name] stated that while her mom was in the hospital her CO2 level was too high which could be that she is getting too much oxygen. [name] stated that her mom is using 3.5 liters of oxygen. [name] stated that her mom is still asleep this morning so not sure how she is today.

## 2019-09-14 NOTE — ED Notes (Signed)
Assumed care of pt. Pt alert, resting on cart in NAD. Breathing easy, non-labored and speaking in full sentences. VSS on monitors. Call light within reach. Denies any needs at this time. Will continue to monitor

## 2019-09-14 NOTE — Telephone Encounter (Signed)
Given the entirety of the situation, I think ER eval makes sense.  Thanks.

## 2019-09-14 NOTE — ED Notes (Signed)
Pt back from CT without incidence 

## 2019-09-14 NOTE — ED Notes (Signed)
Patient verbalizes understanding of discharge instructions. Opportunity for questioning and answers were provided. All questions answered completely. PIV removed, catheter intact. Armband removed by staff, pt discharged from ED. Wheeled to waiting room and picked up by daughter

## 2019-09-14 NOTE — ED Triage Notes (Signed)
Pt here for chest pain last night starting at 8 pm. Took two nitro with relief, chest pain resolved but still having pain between shoulder blades today. Pt having blood in stool, told last week her hemoglobin was low. Wears 2L O2 at home.

## 2019-09-14 NOTE — ED Notes (Signed)
Pt taken to CT in NAD 

## 2019-09-14 NOTE — Telephone Encounter (Signed)
If she is still having chest pain or back pain, then I would give another NTG and have her get recheck ASAP at ER, especially given her anemia.  Please have her check on patient in the meantime.  Thanks .

## 2019-09-14 NOTE — Telephone Encounter (Signed)
[name] notified as instructed by telephone and verbalized understanding.[name] stated that her mom has gone back to bed and she is going to get her up and dressed. [name] stated that she is going to take her back to [facility] ER for evaluation.

## 2019-09-14 NOTE — ED Notes (Signed)
Daughter [name] given update over the phone

## 2019-09-14 NOTE — Telephone Encounter (Signed)
Patient's daughter [name] notified as instructed by telephone. [name] stated that her mom is not having any chest pan or pressure this morning. [name] stated that her mom is complaining of her back hurting from the middle of her shoulders down to the lower part of her back. [name] stated that her mom does seem to be weaker today that a few days ago. [name] stated that her mom states that the pain goes across her shoulder blades. [name] stated that her mom denies any jaw or arm pain.

## 2019-09-14 NOTE — Telephone Encounter (Signed)
Noted. Thanks. Agreed.  

## 2019-09-15 ENCOUNTER — Telehealth: Payer: Self-pay | Admitting: *Deleted

## 2019-09-15 DIAGNOSIS — M810 Age-related osteoporosis without current pathological fracture: Secondary | ICD-10-CM | POA: Diagnosis not present

## 2019-09-15 DIAGNOSIS — J449 Chronic obstructive pulmonary disease, unspecified: Secondary | ICD-10-CM | POA: Diagnosis not present

## 2019-09-15 DIAGNOSIS — N189 Chronic kidney disease, unspecified: Secondary | ICD-10-CM | POA: Diagnosis not present

## 2019-09-15 DIAGNOSIS — D631 Anemia in chronic kidney disease: Secondary | ICD-10-CM | POA: Diagnosis not present

## 2019-09-15 DIAGNOSIS — M519 Unspecified thoracic, thoracolumbar and lumbosacral intervertebral disc disorder: Secondary | ICD-10-CM | POA: Diagnosis not present

## 2019-09-15 DIAGNOSIS — I129 Hypertensive chronic kidney disease with stage 1 through stage 4 chronic kidney disease, or unspecified chronic kidney disease: Secondary | ICD-10-CM | POA: Diagnosis not present

## 2019-09-15 DIAGNOSIS — M160 Bilateral primary osteoarthritis of hip: Secondary | ICD-10-CM | POA: Diagnosis not present

## 2019-09-15 DIAGNOSIS — M47816 Spondylosis without myelopathy or radiculopathy, lumbar region: Secondary | ICD-10-CM | POA: Diagnosis not present

## 2019-09-15 DIAGNOSIS — D62 Acute posthemorrhagic anemia: Secondary | ICD-10-CM | POA: Diagnosis not present

## 2019-09-15 LAB — URINE CULTURE: Culture: NO GROWTH

## 2019-09-15 NOTE — Telephone Encounter (Signed)
Left detailed message on voicemail.  

## 2019-09-15 NOTE — Telephone Encounter (Signed)
[name] with [facility] left a voicemail requesting verbal orders for speech therapy home health to target deficit in swallow function. [name] is requesting twice a week for two weeks and once a week for one week. [name] requested a call back as soon as possible.

## 2019-09-15 NOTE — Telephone Encounter (Signed)
Please give the order.  Thanks.   

## 2019-09-16 ENCOUNTER — Telehealth: Payer: Self-pay | Admitting: Family Medicine

## 2019-09-16 DIAGNOSIS — D62 Acute posthemorrhagic anemia: Secondary | ICD-10-CM

## 2019-09-16 DIAGNOSIS — J9611 Chronic respiratory failure with hypoxia: Secondary | ICD-10-CM

## 2019-09-16 NOTE — Telephone Encounter (Signed)
Nori Riis with [facility] called. They are requesting a referral for the patient. They needs this for Medication management and diease management   [facility] # [phone]

## 2019-09-16 NOTE — Telephone Encounter (Signed)
Called Craige Cotta the Occupational Therapist with [facility] and gave Verbal orders for Skilled Nursing for Medication management and Disease Management. He did not need the order faxed.

## 2019-09-16 NOTE — Telephone Encounter (Signed)
Ordered. Thanks.  Needs face to face visit, okay to set up via video if needed.

## 2019-09-16 NOTE — Telephone Encounter (Signed)
Spoke to patient and a virtual visit scheduled with Dr. Damita Dunnings Friday 09/18/19 at 12:30 pm.

## 2019-09-17 DIAGNOSIS — N189 Chronic kidney disease, unspecified: Secondary | ICD-10-CM | POA: Diagnosis not present

## 2019-09-17 DIAGNOSIS — D631 Anemia in chronic kidney disease: Secondary | ICD-10-CM | POA: Diagnosis not present

## 2019-09-17 DIAGNOSIS — I129 Hypertensive chronic kidney disease with stage 1 through stage 4 chronic kidney disease, or unspecified chronic kidney disease: Secondary | ICD-10-CM | POA: Diagnosis not present

## 2019-09-17 DIAGNOSIS — M160 Bilateral primary osteoarthritis of hip: Secondary | ICD-10-CM | POA: Diagnosis not present

## 2019-09-17 DIAGNOSIS — M47816 Spondylosis without myelopathy or radiculopathy, lumbar region: Secondary | ICD-10-CM | POA: Diagnosis not present

## 2019-09-17 DIAGNOSIS — J449 Chronic obstructive pulmonary disease, unspecified: Secondary | ICD-10-CM | POA: Diagnosis not present

## 2019-09-17 DIAGNOSIS — M810 Age-related osteoporosis without current pathological fracture: Secondary | ICD-10-CM | POA: Diagnosis not present

## 2019-09-17 DIAGNOSIS — D62 Acute posthemorrhagic anemia: Secondary | ICD-10-CM | POA: Diagnosis not present

## 2019-09-17 DIAGNOSIS — M519 Unspecified thoracic, thoracolumbar and lumbosacral intervertebral disc disorder: Secondary | ICD-10-CM | POA: Diagnosis not present

## 2019-09-18 ENCOUNTER — Encounter: Payer: Self-pay | Admitting: Family Medicine

## 2019-09-18 ENCOUNTER — Ambulatory Visit (INDEPENDENT_AMBULATORY_CARE_PROVIDER_SITE_OTHER): Payer: Medicare PPO | Admitting: Family Medicine

## 2019-09-18 DIAGNOSIS — N189 Chronic kidney disease, unspecified: Secondary | ICD-10-CM | POA: Diagnosis not present

## 2019-09-18 DIAGNOSIS — D631 Anemia in chronic kidney disease: Secondary | ICD-10-CM | POA: Diagnosis not present

## 2019-09-18 DIAGNOSIS — M797 Fibromyalgia: Secondary | ICD-10-CM | POA: Diagnosis not present

## 2019-09-18 DIAGNOSIS — M810 Age-related osteoporosis without current pathological fracture: Secondary | ICD-10-CM | POA: Diagnosis not present

## 2019-09-18 DIAGNOSIS — D649 Anemia, unspecified: Secondary | ICD-10-CM | POA: Diagnosis not present

## 2019-09-18 DIAGNOSIS — I129 Hypertensive chronic kidney disease with stage 1 through stage 4 chronic kidney disease, or unspecified chronic kidney disease: Secondary | ICD-10-CM | POA: Diagnosis not present

## 2019-09-18 DIAGNOSIS — D62 Acute posthemorrhagic anemia: Secondary | ICD-10-CM | POA: Diagnosis not present

## 2019-09-18 DIAGNOSIS — R630 Anorexia: Secondary | ICD-10-CM

## 2019-09-18 DIAGNOSIS — M519 Unspecified thoracic, thoracolumbar and lumbosacral intervertebral disc disorder: Secondary | ICD-10-CM | POA: Diagnosis not present

## 2019-09-18 DIAGNOSIS — M160 Bilateral primary osteoarthritis of hip: Secondary | ICD-10-CM | POA: Diagnosis not present

## 2019-09-18 DIAGNOSIS — J449 Chronic obstructive pulmonary disease, unspecified: Secondary | ICD-10-CM | POA: Diagnosis not present

## 2019-09-18 DIAGNOSIS — I10 Essential (primary) hypertension: Secondary | ICD-10-CM | POA: Diagnosis not present

## 2019-09-18 DIAGNOSIS — M47816 Spondylosis without myelopathy or radiculopathy, lumbar region: Secondary | ICD-10-CM | POA: Diagnosis not present

## 2019-09-18 NOTE — ED Provider Notes (Signed)
[facility] EMERGENCY DEPARTMENT Provider Note   CSN: [id number] Arrival date & time: 09/14/19  1350     History Chief Complaint  Patient presents with  . Chest Pain    [name] is a 86 y.o. female.   Chest Pain Pain location:  Substernal area Pain quality: aching and sharp   Pain radiates to:  Upper back Pain severity:  Mild Onset quality:  Gradual Timing:  Constant Progression:  Improving Chronicity:  New Context: not breathing   Relieved by:  None tried Worsened by:  Nothing Ineffective treatments:  None tried Associated symptoms: no abdominal pain        Past Medical History:  Diagnosis Date  . Allergic rhinitis, cause unspecified   . Allergy, unspecified not elsewhere classified   . Anxiety   . Asthma   . Chronic kidney disease    frequency  . COPD (chronic obstructive pulmonary disease) ([place])   . Degeneration of intervertebral disc, site unspecified   . Diverticulosis of colon (without mention of hemorrhage)   . Esophageal reflux   . Headache   . Insomnia, unspecified   . Myalgia and myositis, unspecified   . Need for prophylactic hormone replacement therapy (postmenopausal)   . Osteoarthritis of right knee 08/27/2011   R knee pain, s/p arthroscopy 2012 per [name]   . Osteoporosis, unspecified   . Other abnormal blood chemistry   . Other and unspecified hyperlipidemia   . Other chronic pain   . Stroke [facility])    mini strokes  . Unspecified essential hypertension   . Unspecified hypothyroidism     Patient Active Problem List   Diagnosis Date Noted  . Lower GI bleeding 09/09/2019  . DNR (do not resuscitate) 09/08/2019  . Anemia 09/07/2019  . Macrocytic anemia 09/07/2019  . Jerking 09/07/2019  . Fall at home, initial encounter 09/07/2019  . History of CVA (cerebrovascular accident) 09/07/2019  . History of breast cancer 09/07/2019  . Pelvic pain 03/25/2019  . Dysphagia 02/18/2019  . Coronary artery disease  involving native coronary artery of native heart with unstable angina pectoris ([place]) 01/30/2019  . Presence of drug coated stent in right coronary artery 01/30/2019  . Left main coronary artery disease - ~50-60% dLM (&70% dLAD) -- Plan Med Rx 01/30/2019  . Ischemic cardiomyopathy 01/30/2019  . Acute ST elevation myocardial infarction (STEMI) of inferior wall ([place]) 01/29/2019  . AAA (abdominal aortic aneurysm) ([place]) 12/31/2018  . Malignant neoplasm of upper-outer quadrant of right breast in female, estrogen receptor positive ([place]) 09/26/2018  . Breast mass 08/28/2018  . Bilateral impacted cerumen 08/14/2017  . Laryngopharyngeal reflux (LPR) 08/14/2017  . Presbycusis of both ears 08/14/2017  . Thoracic aortic aneurysm ([place]) 04/10/2017  . Abdominal pain 04/05/2017  . Syncope 01/24/2017  . Low back pain 01/01/2017  . Medicare annual wellness visit, subsequent 09/26/2016  . Hard of hearing 09/26/2016  . Vitamin D deficiency 09/26/2016  . Advance care planning 09/26/2016  . Encounter for chronic pain management 07/26/2016  . Edema 10/21/2014  . Multinodular thyroid 07/07/2013  . HTN (hypertension) 06/09/2013  . Chronic respiratory failure ([place]) 05/13/2013  . Cough 05/02/2013  . Vertigo 12/31/2012  . Acute blood loss anemia 12/13/2011  . Osteoarthritis of right knee 08/27/2011  . Gold C Copd with asthmatic bronchitis component and chronic resp failure 06/26/2011  . Neck pain 04/02/2011  . RISK OF FALLING 08/17/2010  . Hypokalemia 05/24/2009  . Anemia, unspecified 04/10/2008  . Anxiety state 03/01/2008  .  Osteoporosis 06/23/2007  . Chronic pain syndrome 12/26/2006  . Hypothyroidism 12/24/2006  . Hyperlipidemia with target LDL less than 70 12/24/2006  . COMMON MIGRAINE 12/24/2006  . GERD 12/24/2006  . DEGENERATIVE DISC DISEASE 12/24/2006  . Fibromyalgia 12/24/2006  . Insomnia 12/24/2006  . HYPERGLYCEMIA 12/24/2006    Past Surgical History:  Procedure Laterality Date  . BACK  SURGERY    . breast cystectomy    . CORONARY/GRAFT ACUTE MI REVASCULARIZATION N/A 01/29/2019   Procedure: CORONARY/GRAFT ACUTE MI REVASCULARIZATION;  Surgeon: Belva Crome, MD;  Location: [facility];  Service: Cardiovascular;  Laterality: N/A;  . JOINT REPLACEMENT     shoulder right  . KNEE ARTHROSCOPY     Right knee 2012  . MASS EXCISION Left 04/07/2015   Procedure: MINOR EXCISION OF MASS LEFT SMALL FINGER;  Surgeon: Leanora Cover, MD;  Location: [facility];  Service: Orthopedics;  Laterality: Left;  . SHOULDER SURGERY    . TOTAL KNEE ARTHROPLASTY  12/11/2011   Procedure: TOTAL KNEE ARTHROPLASTY;  Surgeon: Johnny Bridge, MD;  Location: [facility];  Service: Orthopedics;  Laterality: Right;  . UMBILICAL HERNIA REPAIR N/A 06/09/2013   Procedure: EXPLORATIORY LAPAROTOMY, HERNIA REPAIR UMBILICAL, INSERTION OF MESH;  Surgeon: Haywood Lasso, MD;  Location: [facility];  Service: General;  Laterality: N/A;     OB History   No obstetric history on file.     Family History  Problem Relation Age of Onset  . Lung cancer Brother        x2  . Hypertension Father   . Heart disease Father   . Heart disease Mother   . Asthma Sister     Social History   Tobacco Use  . Smoking status: Former Smoker    Packs/day: 1.50    Years: 10.00    Pack years: 15.00    Types: Cigarettes    Quit date: 07/09/1985    Years since quitting: 34.2  . Smokeless tobacco: Never Used  . Tobacco comment: 1 1/2 ppd x 20 years  Substance Use Topics  . Alcohol use: No    Alcohol/week: 0.0 standard drinks  . Drug use: No    Home Medications Prior to Admission medications   Medication Sig Start Date End Date Taking? Authorizing Provider  alendronate (FOSAMAX) 70 MG tablet Take 1 tablet (70 mg total) by mouth once a week. Take with a full glass of water on an empty stomach. Patient taking differently: Take 70 mg by mouth every Thursday. Take with a full glass of water on an empty stomach. 03/10/19   Yes Magrinat, Virgie Dad, MD  ALPRAZolam (XANAX) 1 MG tablet TAKE 1/2 TABLET BY MOUTH 3 TIMES DAILY AS NEEDED FOR ANXIETY. Patient taking differently: Take 0.5 mg by mouth 3 (three) times daily.  08/25/19  Yes Tonia Ghent, MD  anastrozole (ARIMIDEX) 1 MG tablet Take 1 mg by mouth daily.   Yes [provider]  Calcium Carbonate (CALTRATE 600 PO) Take 600 mg by mouth daily.    Yes [provider]  cholecalciferol (VITAMIN D) 1000 units tablet Take 1 tablet (1,000 Units total) by mouth daily. 09/25/16  Yes Tonia Ghent, MD  clopidogrel (PLAVIX) 75 MG tablet Take 1 tablet (75 mg total) by mouth daily. 09/10/19 10/10/19 Yes Pahwani, Einar Grad, MD  diclofenac sodium (VOLTAREN) 1 % GEL APPLY 4 GRAMS TO AFFECTED AREA 2 TIMES A DAY. Patient taking differently: Apply 4 g topically 2 (two) times daily.  08/26/17  Yes Tonia Ghent, MD  DULoxetine (CYMBALTA) 60 MG capsule TAKE 1 CAPSULE BY MOUTH DAILY Patient taking differently: Take 60 mg by mouth daily.  05/07/19  Yes Tonia Ghent, MD  ferrous sulfate (FERROUSUL) 325 (65 FE) MG tablet Take 1 tablet (325 mg total) by mouth daily with breakfast. 08/22/18  Yes Tonia Ghent, MD  hydroxypropyl methylcellulose / hypromellose (ISOPTO TEARS / GONIOVISC) 2.5 % ophthalmic solution Place 1 drop into both eyes 3 (three) times daily as needed for dry eyes.   Yes [provider]  levothyroxine (SYNTHROID) 112 MCG tablet TAKE 1 TABLET BY MOUTH DAILY BEFORE BREAKFAST. Patient taking differently: Take 112 mcg by mouth daily before breakfast.  04/10/19  Yes Tonia Ghent, MD  losartan (COZAAR) 25 MG tablet Take 0.5 tablets (12.5 mg total) by mouth daily. 02/01/19  Yes Almyra Deforest, PA  Multiple Vitamin (MULTIVITAMIN WITH MINERALS) TABS tablet Take 1 tablet by mouth daily.   Yes [provider]  Multiple Vitamins-Minerals (OCUVITE PRESERVISION PO) Take 1 tablet by mouth daily.   Yes [provider]  nitroGLYCERIN (NITROSTAT) 0.4  MG SL tablet Place 1 tablet (0.4 mg total) under the tongue every 5 (five) minutes as needed for chest pain. 01/30/19  Yes Belva Crome, MD  omeprazole (PRILOSEC) 20 MG capsule TAKE 1 CAPSULE BY MOUTH 2 TIMES DAILY BEFORE A MEAL. Patient taking differently: Take 20 mg by mouth 2 (two) times daily before a meal.  06/22/19  Yes Tonia Ghent, MD  OXYGEN Inhale 3-3.5 L into the lungs continuous.   Yes [provider]  PROAIR HFA 108 (90 Base) MCG/ACT inhaler INHALE 1 TO 2 PUFFS INTO THE LUNGS EVERY 6 HOURS AS NEEDED FOR WHEEZING OR SHORTNESS OF BREATH. Patient taking differently: Inhale 1-2 puffs into the lungs every 6 (six) hours as needed for wheezing or shortness of breath.  04/10/19  Yes Tonia Ghent, MD  rosuvastatin (CRESTOR) 10 MG tablet Take 1 tablet (10 mg total) by mouth daily. 05/13/19  Yes Tonia Ghent, MD  tiotropium (SPIRIVA HANDIHALER) 18 MCG inhalation capsule PLACE 1 CAPSULE INTO INHALER AND INHALE ONCE A DAY Patient taking differently: Place 18 mcg into inhaler and inhale daily.  02/16/19  Yes Tonia Ghent, MD  traZODone (DESYREL) 50 MG tablet Take 3 tablets (150 mg total) by mouth at bedtime as needed for sleep. Patient taking differently: Take 150 mg by mouth at bedtime.  01/27/19  Yes Tonia Ghent, MD  bisoprolol (ZEBETA) 5 MG tablet Take 0.5 tablets (2.5 mg total) by mouth daily. Patient not taking: Reported on 09/07/2019 01/31/19 09/14/19  Belva Crome, MD  gabapentin (NEURONTIN) 300 MG capsule 2 tabs in the AM and 1 tab in the afternoon Patient not taking: Reported on 09/07/2019 12/30/18 09/14/19  Tonia Ghent, MD    Allergies    Doxycycline, Sulfadiazine, and Sulfa antibiotics  Review of Systems   Review of Systems  Cardiovascular: Positive for chest pain.  Gastrointestinal: Negative for abdominal pain.  All other systems reviewed and are negative.   Physical Exam Updated Vital Signs BP (!) 141/80   Pulse 72   Temp 97.7 F (36.5 C) (Oral)    Resp 18   SpO2 100%   Physical Exam Vitals and nursing note reviewed.  Constitutional:      Appearance: She is well-developed.  HENT:     Head: Normocephalic and atraumatic.  Cardiovascular:     Rate and Rhythm: Normal rate and  regular rhythm.  Pulmonary:     Effort: No respiratory distress.     Breath sounds: Normal breath sounds. No stridor.  Abdominal:     General: There is no distension.  Musculoskeletal:        General: Normal range of motion.     Cervical back: Normal range of motion.     Right lower leg: No edema.     Left lower leg: No edema.  Skin:    General: Skin is warm and dry.     Coloration: Skin is not cyanotic or pale.  Neurological:     Mental Status: She is alert.     ED Results / Procedures / Treatments   Labs (all labs ordered are listed, but only abnormal results are displayed) Labs Reviewed  BASIC METABOLIC PANEL - Abnormal; Notable for the following components:      Result Value   CO2 35 (*)    Calcium 8.8 (*)    All other components within normal limits  CBC - Abnormal; Notable for the following components:   WBC 3.8 (*)    RBC 3.40 (*)    Hemoglobin 10.0 (*)    HCT 33.4 (*)    MCHC 29.9 (*)    All other components within normal limits  URINE CULTURE  URINALYSIS, ROUTINE W REFLEX MICROSCOPIC  TROPONIN I (HIGH SENSITIVITY)  TROPONIN I (HIGH SENSITIVITY)    EKG EKG Interpretation  Date/Time:  Monday September 14 2019 17:04:10 EST Ventricular Rate:  59 PR Interval:    QRS Duration: 89 QT Interval:  489 QTC Calculation: 485 R Axis:   8 Text Interpretation: Sinus rhythm Borderline low voltage, extremity leads No significant change since last tracing Confirmed by Merrily Pew [id number]) on 09/14/2019 9:40:28 PM   Radiology No results found.  Procedures Procedures (including critical care time)  Medications Ordered in ED Medications  lactated ringers bolus 500 mL (0 mLs Intravenous Stopped 09/14/19 1925)  iohexol (OMNIPAQUE) 300 MG/ML  solution 75 mL (75 mLs Intravenous Contrast Given 09/14/19 2130)    ED Course  I have reviewed the triage vital signs and the nursing notes.  Pertinent labs & imaging results that were available during my care of the patient were reviewed by me and considered in my medical decision making (see chart for details).    MDM Rules/Calculators/A&P                      Multiple issues recently including a hospitalization for acute blood loss anemia but not want to get a colonoscopy.  She returns here for generalized weakness but then yesterday she has been chest pain that radiated to her back the chest pain resolved with nitroglycerin but she still having some back pain.  I discussed most of this with her daughter over the phone and the patient as well.  She has good PCP follow-up.  She ruled out for ACS here.  CT scan of her chest to evaluate for any changes or metastatic breast cancer she has a history of the same this was negative.  At this time do not see any reason to admit her for the hospital.  She has no evidence of infection, cardiac disease, metabolic disease or encephalopathy at requires hospitalization.  Final Clinical Impression(s) / ED Diagnoses Final diagnoses:  Nonspecific chest pain  Weakness  Decreased appetite    Rx / DC Orders ED Discharge Orders    None       Lanika Colgate, Corene Cornea, MD  09/18/19 0048  

## 2019-09-18 NOTE — Progress Notes (Signed)
Virtual visit completed through WebEx or similar program Patient location: home  Provider location: Financial [facility] at [facility], office   Pandemic considerations d/w pt.   Limitations and rationale for visit method d/w patient.  Patient agreed to proceed.   CC: ER f/u  HPI:  Inpatient admission for anemia.  She was weak and having speech changes. Changed from brilinta to plavix and transfused.  Colonoscopy deferred by patient.  After she was discharged she was seen at ER, CT done. HGB improved on recheck.  All of that was discussed with patient and daughter and home health nurse during the video visit.  No blood in stool recently.    Family has noted a decrease in appetite in the meantime.  Her mood has been lower.  She has been maintained on Cymbalta in the meantime.  That may be helping some of the pain but she still complains of aches at baseline.  She clearly does worse when she has been off Cymbalta in the past.  She still on oxygen at baseline.  She has home health and her daughter is at home now helping the patient.  Home health is going to help with med admin.  She has been off crestor, losartan and bisoprolol.  Her blood pressure still been controlled, see below.   ======================= No evidence of pulmonary emboli.  Dilatation of the ascending aorta to 4.2 cm. Recommend annual imaging followup by CTA or MRA. This recommendation follows 2010 ACCF/AHA/AATS/ACR/ASA/SCA/SCAI/SIR/STS/SVM Guidelines for the Diagnosis and Management of Patients with Thoracic Aortic Disease. Circulation. 2010; [id number]. Aortic aneurysm NOS (ICD10-I71.9)  Mildly prominent right axillary lymph nodes but improved when compared with the prior exam. This is been previously biopsied and is improved in size when compare with the prior ultrasound from 09/01/2018 at which time it measured 2.9 cm.  Aortic Atherosclerosis (ICD10-I70.0).  Aortic aneurysm NOS  (ICD10-I71.9). =======================  167/78, p70, this AM.   Other recent readings are listed below.   128/76, that was similar to prev.  p80 118/80, 120/70, 128/70  Meds and allergies reviewed.   ROS: Per HPI unless specifically indicated in ROS section   NAD Speech wnl  A/P:  Multiple issues to consider.  She has a decrease in appetite. Reasonable to hold fosamax for now and see if appetite would improve off fosamax.    Discussed mood.  On to consider options in the meantime.  It may be reasonable to change her Cymbalta dose versus consider another medication like mirtazapine vs buspar to help with her mood.  Reasonable to dec xanax to BID PRN  She has a history of aches and I would continue tylenol for pain.  She can take this up to 3 times a day-discussed with family about scheduling this.  I think it makes sense not to make multiple medication changes right now.  She has been off Crestor, losartan and bisoprolol.  Since her blood pressure is reasonable I would not restart her blood pressure medications.  I do not want to induce more muscle aches with statin at this point so reasonable to hold off on Crestor at least for now.  Her hemoglobin has improved on most recent checks.  We can follow this episodically.  At least 30 minutes were devoted to patient care in this encounter (this can potentially include time spent reviewing the patient's file/history, interviewing and examining the patient, counseling/reviewing plan with patient, ordering referrals, ordering tests, reviewing relevant laboratory or x-ray data, and documenting the encounter).

## 2019-09-20 DIAGNOSIS — R630 Anorexia: Secondary | ICD-10-CM | POA: Insufficient documentation

## 2019-09-20 MED ORDER — BUSPIRONE HCL 5 MG PO TABS: 5.0000 mg | ORAL_TABLET | Freq: Three times a day (TID) | ORAL | Status: DC

## 2019-09-20 NOTE — Assessment & Plan Note (Signed)
I think it makes sense not to make multiple medication changes right now.  She has been off Crestor, losartan and bisoprolol.  Since her blood pressure is reasonable I would not restart her blood pressure medications.  I do not want to induce more muscle aches with statin at this point so reasonable to hold off on Crestor at least for now.

## 2019-09-20 NOTE — Progress Notes (Signed)
[place]  [phone] [phone]     ID: [name] DOB: 1930-10-25  MR#: [id number]  HWT#:[id number]  Patient Care Team: Tonia Ghent, MD as PCP - General Tamala Julian Lynnell Dike, MD as PCP - Cardiology (Cardiology) Calden Dorsey, Virgie Dad, MD as Consulting Physician (Oncology) [name], MD as Consulting Physician (Otolaryngology) Marshell Garfinkel, MD as Consulting Physician (Pulmonary Disease) Aurea Graff OTHER MD: I connected with [name] on 09/21/19 at 10:00 AM EDT by telephone visit and verified that I am speaking with the correct person using two identifiers.   I discussed the limitations, risks, security and privacy concerns of performing an evaluation and management service by telemedicine and the availability of in-person appointments. I also discussed with the patient that there may be a patient responsible charge related to this service. The patient expressed understanding and agreed to proceed.   Other persons participating in the visit and their role in the encounter: daughte [name]  Patient's location: home Provider's location:clinic     CHIEF COMPLAINT: Estrogen receptor positive breast cancer  CURRENT TREATMENT: Anastrozole   INTERVAL HISTORY: [name] was contacted today for follow-up of her estrogen receptor positive breast cancer.   Since her last visit, she experienced a fall and presented to the ED on 09/07/2019. She also reported episodes of jerking in her hands and legs, more slurred speech, headache, some right-sided facial drooping, and dark stools. Fortunately, her workup, which included chest and pelvic x-rays, head CT and MRI, and EEG, were negative for acute changes.  She presented to the ED again on 09/14/2019 with chest pain that radiated to her back. Chest CT performed at that time was negative for acute changes or metastatic breast cancer.  In fact her axillary lymph nodes appear  smaller.  Jaleia continues on anastrozole.  She has no side effects that she is aware of from this medication including hot flashes.  She is supposedly on alendronate/Fosamax.  She and her daughter had a little trouble identifying this medication and I do not see it on her current list.  She should be on it given her severe osteoporosis however.   REVIEW OF SYSTEMS: Kemaya tells me given her age as she does not want surgery for her breast cancer but she is willing to have it treated as we are doing and she is already scheduled for mammography later later this month.  She has had both her COVID-19 vaccine shots.  She is not sure how she fell.  She thinks it may be due to anemia.  A detailed review of systems today was otherwise stable.  HISTORY OF CURRENT ILLNESS: From the original intake note:  The patient noted a change in the upper outer right breast, as well as a palpable lump in the right axilla, sometime mid-to-late 2019.  She did discuss it briefly with her husband but he was not in good health and she did not want to worry him. She had not had a prior mammogram for better than 2 decades.  More recently she brought this to medical attention and on 09/01/2018 she underwent bilateral diagnostic mammography with tomography and bilateral breast and axillae ultrasonography at the breast center.  This found the breast density to be category B.  In the upper right breast there was an irregular mass with architectural distortion and 2 possible satellite masses.  In the right axilla there were 2 enlarged lymph nodes.  By physical exam there was a firm irregular mass in  the upper outer quadrant of the right breast and a smooth easily palpable mass in the right axilla.  Ultrasound of the right breast confirmed a 4.2 cm irregular hypoechoic mass centered at 11:00.  There was no satellite mass seen.  In the right axilla there were 2 enlarged lymph nodes, as well as 1 with normal size but abnormal morphology.   The palpable enlarged lymph nodes measure 2.9 and 3.1 cm.  The third node measured 0.9 cm but had no hilar fat.  Biopsy of the right breast upper outer quadrant mass on 09/05/2018 showed ([id number]) invasive ductal carcinoma, grade 2, estrogen receptor 95% positive with strong staining intensity, estrogen receptor negative, HER-2 equivocal by immunohistochemistry but negative by FISH with a signals ratio of 1.16 and copy number per cell 1.80.  The MIB-1 was 15%  The right axillary lymph node biopsied was also positive for carcinoma.  On the left side there was a lucent round circumscribed mass in the upper outer quadrant.  By ultrasound this proved to be a fat necrosis oil cyst requiring no further follow-up  The patient's subsequent history is as detailed below.   PAST MEDICAL HISTORY: Past Medical History:  Diagnosis Date  . Allergic rhinitis, cause unspecified   . Allergy, unspecified not elsewhere classified   . Anxiety   . Asthma   . Chronic kidney disease    frequency  . COPD (chronic obstructive pulmonary disease) ([place])   . Degeneration of intervertebral disc, site unspecified   . Diverticulosis of colon (without mention of hemorrhage)   . Esophageal reflux   . Headache   . Insomnia, unspecified   . Myalgia and myositis, unspecified   . Need for prophylactic hormone replacement therapy (postmenopausal)   . Osteoarthritis of right knee 08/27/2011   R knee pain, s/p arthroscopy 2012 per [name]   . Osteoporosis, unspecified   . Other abnormal blood chemistry   . Other and unspecified hyperlipidemia   . Other chronic pain   . Stroke [facility])    mini strokes  . Unspecified essential hypertension   . Unspecified hypothyroidism     PAST SURGICAL HISTORY: Past Surgical History:  Procedure Laterality Date  . BACK SURGERY    . breast cystectomy    . CORONARY/GRAFT ACUTE MI REVASCULARIZATION N/A 01/29/2019   Procedure: CORONARY/GRAFT ACUTE MI REVASCULARIZATION;  Surgeon:  Belva Crome, MD;  Location: [facility];  Service: Cardiovascular;  Laterality: N/A;  . JOINT REPLACEMENT     shoulder right  . KNEE ARTHROSCOPY     Right knee 2012  . MASS EXCISION Left 04/07/2015   Procedure: MINOR EXCISION OF MASS LEFT SMALL FINGER;  Surgeon: Leanora Cover, MD;  Location: [facility];  Service: Orthopedics;  Laterality: Left;  . SHOULDER SURGERY    . TOTAL KNEE ARTHROPLASTY  12/11/2011   Procedure: TOTAL KNEE ARTHROPLASTY;  Surgeon: Johnny Bridge, MD;  Location: [facility];  Service: Orthopedics;  Laterality: Right;  . UMBILICAL HERNIA REPAIR N/A 06/09/2013   Procedure: EXPLORATIORY LAPAROTOMY, HERNIA REPAIR UMBILICAL, INSERTION OF MESH;  Surgeon: Haywood Lasso, MD;  Location: [facility];  Service: General;  Laterality: N/A;    FAMILY HISTORY Family History  Problem Relation Age of Onset  . Lung cancer Brother        x2  . Hypertension Father   . Heart disease Father   . Heart disease Mother   . Asthma Sister   The patient's father died from a heart attack  at age 69.  The patient's mother died suddenly at age 56 while in apparent good health.  The patient is the youngest of 6 siblings, the other ones all deceased.  One sister developed breast cancer in her 65s.  There is no other family history of breast, ovarian, pancreatic, or prostate cancer to the patient's knowledge   GYNECOLOGIC HISTORY:  No LMP recorded. Patient is postmenopausal. Menarche: 86 years old Age at first live birth: 86 years old [place] P 2 LMP approximately mid 29s HRT treated with estropitate ([facility]) for "many years" Hysterectomy?  No Salpingo-oophorectomy?  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  Her husband was a high school principal.  He is now deceased.  The patient has 2 daughters, Tomi Bamberger "[name]" [place], who lives in [place] and is a retired Paediatric nurse; and Anita Laguna, who works for [employer] and recently moved to  [place].  The patient has 4 grandchildren.  One grandson unfortunately died in a motor vehicle accident.  The patient attends [employer].  Lidia generally lives by herself but currently her daughter [name] is staying with her.   ADVANCED DIRECTIVES: The patient has named both her daughters as healthcare powers of attorney but currently daughter [name] would be first since she is the one in town   HEALTH MAINTENANCE: Social History   Tobacco Use  . Smoking status: Former Smoker    Packs/day: 1.50    Years: 10.00    Pack years: 15.00    Types: Cigarettes    Quit date: 07/09/1985    Years since quitting: 34.2  . Smokeless tobacco: Never Used  . Tobacco comment: 1 1/2 ppd x 20 years  Substance Use Topics  . Alcohol use: No    Alcohol/week: 0.0 standard drinks  . Drug use: No     Colonoscopy: n/a  PAP: n/a  Bone density: Remote   Allergies  Allergen Reactions  . Doxycycline Other (See Comments)    GI upset  . Sulfadiazine Other (See Comments)    REACTION: Fever, aches  . Sulfa Antibiotics Rash    Current Outpatient Medications  Medication Sig Dispense Refill  . ALPRAZolam (XANAX) 1 MG tablet TAKE 1/2 TABLET BY MOUTH 3 TIMES DAILY AS NEEDED FOR ANXIETY. (Patient taking differently: Take 0.5 mg by mouth 3 (three) times daily. ) 45 tablet 2  . anastrozole (ARIMIDEX) 1 MG tablet Take 1 mg by mouth daily.    . Calcium Carbonate (CALTRATE 600 PO) Take 600 mg by mouth daily.     . cholecalciferol (VITAMIN D) 1000 units tablet Take 1 tablet (1,000 Units total) by mouth daily.    . clopidogrel (PLAVIX) 75 MG tablet Take 1 tablet (75 mg total) by mouth daily. 30 tablet 0  . diclofenac sodium (VOLTAREN) 1 % GEL APPLY 4 GRAMS TO AFFECTED AREA 2 TIMES A DAY. 200 g 3  . DULoxetine (CYMBALTA) 60 MG capsule TAKE 1 CAPSULE BY MOUTH DAILY 90 capsule 1  . ferrous sulfate (FERROUSUL) 325 (65 FE) MG tablet Take 1 tablet (325 mg total) by mouth daily with breakfast.    .  hydroxypropyl methylcellulose / hypromellose (ISOPTO TEARS / GONIOVISC) 2.5 % ophthalmic solution Place 1 drop into both eyes 3 (three) times daily as needed for dry eyes.    Marland Kitchen levothyroxine (SYNTHROID) 112 MCG tablet TAKE 1 TABLET BY MOUTH DAILY BEFORE BREAKFAST. 90 tablet 1  . losartan (COZAAR) 25 MG tablet Take  0.5 tablets (12.5 mg total) by mouth daily. (Patient not taking: Reported on 09/18/2019) 30 tablet 2  . Multiple Vitamin (MULTIVITAMIN WITH MINERALS) TABS tablet Take 1 tablet by mouth daily.    . Multiple Vitamins-Minerals (OCUVITE PRESERVISION PO) Take 1 tablet by mouth daily.    . nitroGLYCERIN (NITROSTAT) 0.4 MG SL tablet Place 1 tablet (0.4 mg total) under the tongue every 5 (five) minutes as needed for chest pain. 25 tablet 12  . omeprazole (PRILOSEC) 20 MG capsule TAKE 1 CAPSULE BY MOUTH 2 TIMES DAILY BEFORE A MEAL. 60 capsule 3  . OXYGEN Inhale 3-3.5 L into the lungs continuous.    Marland Kitchen PROAIR HFA 108 (90 Base) MCG/ACT inhaler INHALE 1 TO 2 PUFFS INTO THE LUNGS EVERY 6 HOURS AS NEEDED FOR WHEEZING OR SHORTNESS OF BREATH. 8.5 g 12  . rosuvastatin (CRESTOR) 10 MG tablet Take 1 tablet (10 mg total) by mouth daily. (Patient not taking: Reported on 09/18/2019) 90 tablet 3  . tiotropium (SPIRIVA HANDIHALER) 18 MCG inhalation capsule PLACE 1 CAPSULE INTO INHALER AND INHALE ONCE A DAY 30 capsule 12  . traZODone (DESYREL) 50 MG tablet Take 3 tablets (150 mg total) by mouth at bedtime as needed for sleep. 90 tablet 6   No current facility-administered medications for this visit.    OBJECTIVE: Televisit  There were no vitals filed for this visit. Wt Readings from Last 3 Encounters:  09/18/19 137 lb (62.1 kg)  09/07/19 137 lb (62.1 kg)  07/23/19 137 lb (62.1 kg)   There is no height or weight on file to calculate BMI.    ECOG FS:2 - Symptomatic, <50% confined to bed   LAB RESULTS:  CMP     Component Value Date/Time   NA 141 09/14/2019 1356   NA 144 02/05/2019 1019   K 4.1  09/14/2019 1356   CL 99 09/14/2019 1356   CO2 35 (H) 09/14/2019 1356   GLUCOSE 75 09/14/2019 1356   BUN 13 09/14/2019 1356   BUN 13 02/05/2019 1019   CREATININE 0.56 09/14/2019 1356   CREATININE 0.72 04/28/2019 1503   CREATININE 0.62 08/22/2018 1625   CALCIUM 8.8 (L) 09/14/2019 1356   PROT 5.8 (L) 09/07/2019 0310   PROT 6.6 03/19/2019 1105   ALBUMIN 3.1 (L) 09/07/2019 0310   ALBUMIN 4.1 03/19/2019 1105   AST 23 09/07/2019 0310   AST 29 04/28/2019 1503   ALT 10 09/07/2019 0310   ALT 9 04/28/2019 1503   ALKPHOS 29 (L) 09/07/2019 0310   BILITOT 0.4 09/07/2019 0310   BILITOT 0.5 04/28/2019 1503   GFRNONAA >60 09/14/2019 1356   GFRNONAA >60 04/28/2019 1503   GFRAA >60 09/14/2019 1356   GFRAA >60 04/28/2019 1503    No results found for: TOTALPROTELP, ALBUMINELP, A1GS, A2GS, BETS, BETA2SER, GAMS, MSPIKE, SPEI  No results found for: KPAFRELGTCHN, LAMBDASER, KAPLAMBRATIO  Lab Results  Component Value Date   WBC 3.8 (L) 09/14/2019   NEUTROABS 2.4 09/07/2019   HGB 10.0 (L) 09/14/2019   HCT 33.4 (L) 09/14/2019   MCV 98.2 09/14/2019   PLT 166 09/14/2019      Chemistry      Component Value Date/Time   NA 141 09/14/2019 1356   NA 144 02/05/2019 1019   K 4.1 09/14/2019 1356   CL 99 09/14/2019 1356   CO2 35 (H) 09/14/2019 1356   BUN 13 09/14/2019 1356   BUN 13 02/05/2019 1019   CREATININE 0.56 09/14/2019 1356   CREATININE 0.72 04/28/2019 1503  CREATININE 0.62 08/22/2018 1625      Component Value Date/Time   CALCIUM 8.8 (L) 09/14/2019 1356   ALKPHOS 29 (L) 09/07/2019 0310   AST 23 09/07/2019 0310   AST 29 04/28/2019 1503   ALT 10 09/07/2019 0310   ALT 9 04/28/2019 1503   BILITOT 0.4 09/07/2019 0310   BILITOT 0.5 04/28/2019 1503      No results found for: LABCA2  No components found for: [id number]  No results for input(s): INR in the last 168 hours.  No results found for: LABCA2  No results found for: DDU202  No results found for: CAN125  No results found  for: RKY706  No results found for: CA2729  No components found for: HGQUANT  No results found for: CEA1 / No results found for: CEA1   No results found for: AFPTUMOR  No results found for: CHROMOGRNA  No results found for: HGBA, HGBA2QUANT, HGBFQUANT, HGBSQUAN (Hemoglobinopathy evaluation)   Lab Results  Component Value Date   LDH 169 04/28/2019    Lab Results  Component Value Date   IRON 46 09/07/2019   TIBC 316 09/07/2019   IRONPCTSAT 15 09/07/2019   (Iron and TIBC)  Lab Results  Component Value Date   FERRITIN 20 09/07/2019    Urinalysis    Component Value Date/Time   COLORURINE YELLOW 09/14/2019 1701   APPEARANCEUR CLEAR 09/14/2019 1701   LABSPEC 1.020 09/14/2019 1701   PHURINE 6.0 09/14/2019 1701   GLUCOSEU NEGATIVE 09/14/2019 1701   GLUCOSEU NEGATIVE 10/24/2016 1429   HGBUR NEGATIVE 09/14/2019 1701   BILIRUBINUR NEGATIVE 09/14/2019 1701   BILIRUBINUR negative 09/20/2016 1625   KETONESUR NEGATIVE 09/14/2019 1701   PROTEINUR NEGATIVE 09/14/2019 1701   UROBILINOGEN 0.2 10/24/2016 1429   NITRITE NEGATIVE 09/14/2019 1701   LEUKOCYTESUR NEGATIVE 09/14/2019 1701     STUDIES: DG Chest 2 View  Result Date: 09/14/2019 CLINICAL DATA:  Chest pain. EXAM: CHEST - 2 VIEW COMPARISON:  Chest radiograph 12/27/2018, 09/07/2019 FINDINGS: Stable cardiomediastinal contours with tortuous thoracic aorta and normal heart size. Chronic coarsening of the interstitium. A few small opacities at the left base likely represents scarring. No pneumothorax or pleural effusion. Postsurgical changes noted in the right shoulder. IMPRESSION: No acute cardiopulmonary finding. Electronically Signed   By: Audie Pinto M.D.   On: 09/14/2019 14:44   CT HEAD WO CONTRAST  Result Date: 09/07/2019 CLINICAL DATA:  Recent fall with altered mental status EXAM: CT HEAD WITHOUT CONTRAST TECHNIQUE: Contiguous axial images were obtained from the base of the skull through the vertex without intravenous  contrast. COMPARISON:  01/23/2017 FINDINGS: Brain: Chronic atrophic and ischemic changes are again identified and stable. No findings to suggest acute hemorrhage, acute infarction or space-occupying mass lesion is noted. Vascular: No hyperdense vessel or unexpected calcification. Skull: Normal. Negative for fracture or focal lesion. Sinuses/Orbits: Mild mucosal thickening is noted within the ethmoid, frontal right maxillary sinuses. Other: Bilateral subdural hygromas are stable in appearance from exam. IMPRESSION: Chronic atrophic and ischemic changes without acute abnormality. Electronically Signed   By: Inez Catalina M.D.   On: 09/07/2019 03:17   CT Chest W Contrast  Result Date: 09/14/2019 CLINICAL DATA:  Chest pain, history of breast carcinoma EXAM: CT CHEST WITH CONTRAST TECHNIQUE: Multidetector CT imaging of the chest was performed during intravenous contrast administration. CONTRAST:  46m OMNIPAQUE IOHEXOL 300 MG/ML  SOLN COMPARISON:  04/05/2017 FINDINGS: Cardiovascular: Thoracic aorta demonstrates atherosclerotic calcification and mild aneurysmal dilatation of the ascending segment to 4.2 cm. Coronary calcifications  are seen. Pulmonary artery is well visualized within normal branching pattern. No filling defect to suggest pulmonary embolism is noted. Mediastinum/Nodes: Thoracic inlet is within normal limits. No hilar or mediastinal adenopathy is noted. The esophagus is air-filled and otherwise within normal limits. Axillary lymph nodes are noted on the right. The largest of these measures 17 x 9 mm. This is improved when compared with the previous exam. Lungs/Pleura: The lungs are well aerated bilaterally. No focal infiltrate or sizable effusion is noted. Mild scarring is noted in the bases bilaterally. Upper Abdomen: Upper abdomen shows no acute abnormality. Musculoskeletal: No chest wall abnormality. No acute or significant osseous findings. IMPRESSION: No evidence of pulmonary emboli. Dilatation of the  ascending aorta to 4.2 cm. Recommend annual imaging followup by CTA or MRA. This recommendation follows 2010 ACCF/AHA/AATS/ACR/ASA/SCA/SCAI/SIR/STS/SVM Guidelines for the Diagnosis and Management of Patients with Thoracic Aortic Disease. Circulation. 2010; 121: K354-S568. Aortic aneurysm NOS (ICD10-I71.9) Mildly prominent right axillary lymph nodes but improved when compared with the prior exam. This is been previously biopsied and is improved in size when compare with the prior ultrasound from 09/01/2018 at which time it measured 2.9 cm. Aortic Atherosclerosis (ICD10-I70.0). Aortic aneurysm NOS (ICD10-I71.9). Electronically Signed   By: Inez Catalina M.D.   On: 09/14/2019 21:48   MR BRAIN WO CONTRAST  Result Date: 09/07/2019 CLINICAL DATA:  Stroke follow-up.  History of breast cancer. EXAM: MRI HEAD WITHOUT CONTRAST TECHNIQUE: Multiplanar, multiecho pulse sequences of the brain and surrounding structures were obtained without intravenous contrast. COMPARISON:  CT head 09/07/2019 FINDINGS: Brain: Negative for acute infarct. Moderate to extensive chronic microvascular ischemic change in the white matter and pons. Small chronic infarcts in the cerebellum bilaterally. Generalized atrophy. 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 Negative for hemorrhage or mass. No midline shift. Vascular: Normal arterial flow voids Skull and upper cervical spine: No focal skeletal lesion. Sinuses/Orbits: Mucosal edema paranasal sinuses. Bilateral mastoid effusion. Bilateral cataract extraction Other: None IMPRESSION: Moderate atrophy and moderate to advanced chronic ischemic change. No acute infarct. Sinus mucosal disease.  Bilateral mastoid effusion. Electronically Signed   By: Franchot Gallo M.D.   On: 09/07/2019 13:15   DG Pelvis Portable  Result Date: 09/07/2019 CLINICAL DATA:  Left hip pain EXAM: PORTABLE PELVIS 1-2 VIEWS COMPARISON:  None. FINDINGS: There is no  evidence of pelvic fracture or diastasis. There is diffuse osteopenia. The sacroiliac joints appear to be intact. Degenerative changes in the lower lumbar spine. Moderate bilateral hip osteoarthritis. IMPRESSION: No definite acute fracture.  Moderate bilateral hip osteoarthritis. Electronically Signed   By: Prudencio Pair M.D.   On: 09/07/2019 05:16   DG Chest Port 1 View  Result Date: 09/07/2019 CLINICAL DATA:  Chest pain, history of recent fall EXAM: PORTABLE CHEST 1 VIEW COMPARISON:  12/27/2018 FINDINGS: Cardiac shadow is within normal limits. Tortuosity of the thoracic aorta is noted accentuated by patient rotation to the right. No focal infiltrate or sizable effusion is seen. Postsurgical changes in the right shoulder are noted. No acute bony abnormality is seen. IMPRESSION: No acute abnormality noted. Electronically Signed   By: Inez Catalina M.D.   On: 09/07/2019 02:50   EEG adult  Result Date: 09/07/2019 Lora Havens, MD     09/07/2019  5:05 PM Patient Name: Chrysa Rampy MRN: [id number] Epilepsy Attending: Lora Havens Referring Physician/Provider: Dr Fuller Plan Date: 09/07/2019 Duration: 26.38 mins Patient history: 86yo F with worsening encephalopathy and tremor like  movements. EEG to evaluate for seizure Level of alertness: awake AEDs during EEG study: None Technical aspects: This EEG study was done with scalp electrodes positioned according to the 10-20 International system of electrode placement. Electrical activity was acquired at a sampling rate of '500Hz'  and reviewed with a high frequency filter of '70Hz'  and a low frequency filter of '1Hz' . EEG data were recorded continuously and digitally stored. DESCRIPTION:  The posterior dominant rhythm consists of 7.5 Hz activity of moderate voltage (25-35 uV) seen predominantly in posterior head regions, symmetric and reactive to eye opening and eye closing.        Intermittent generalized 5-'7Hz'  theta activity was also noted.  Hyperventilation and  photic stimulation were not performed. ABNORMALITY - Intermittent slow, generalized IMPRESSION: This study is suggestive of mild diffuse encephalopathy, non specific to etiology. No seizures or epileptiform discharges were seen throughout the recording. Priyanka Barbra Sarks    ELIGIBLE FOR AVAILABLE RESEARCH PROTOCOL: no  ASSESSMENT: 86 y.o. [place] woman status post right breast upper outer quadrant biopsy 09/05/2018 for a clinical T2 N1, stage IIB invasive ductal carcinoma, grade 2, estrogen receptor positive, progesterone receptor negative, HER-2 not amplified, with an MIB-1 of 15%.  (1) anastrozole started 10/09/2018  (a) bone density 02/24/2019 shows osteoporosis with a T score of -4.4  (b) Fosamax/alendronate started 03/10/2019  (2) patient refuses surgery   PLAN: [name] is now just over a year out from initial diagnosis of breast cance.  She is being treated with anastrozole and secondarily with alendronate/Fosamax.  She is tolerating these well.  She is already scheduled for repeat mammography 09/29/2019.  She had a chest CT and brain scan recently after her emergency room visits.  These do not show evidence of metastatic disease and in fact the right axillary lymph nodes appear smaller.  At this point the plan is to continue the current medications.  The patient will see me again in a year from now after her next mammogram but if there is any untoward finding on the upcoming mammography we can work her in before then  They know to call for any other issue that may develop before then.  Tonny Isensee, Virgie Dad, MD  09/20/19 9:14 AM Medical Oncology and Hematology [facility] [place], [place] [address] Tel. [phone]    Fax. [phone]   I, Wilburn Mylar, am acting as scribe for Dr. Virgie Dad. Sachin Ferencz.  I, Lurline Del MD, have reviewed the above documentation for accuracy and completeness, and I agree with the above.   *Total Encounter Time  as defined by the Centers for Medicare and Medicaid Services includes, in addition to the face-to-face time of a patient visit (documented in the note above) non-face-to-face time: obtaining and reviewing outside history, ordering and reviewing medications, tests or procedures, care coordination (communications with other health care professionals or caregivers) and documentation in the medical record.

## 2019-09-20 NOTE — Assessment & Plan Note (Signed)
Her hemoglobin has improved on most recent checks.  We can follow this episodically.

## 2019-09-20 NOTE — Assessment & Plan Note (Signed)
She has a history of aches and I would continue tylenol for pain.  She can take this up to 3 times a day-discussed with family about scheduling this.  Discussed mood.  On to consider options in the meantime.  It may be reasonable to change her Cymbalta dose versus consider another medication like mirtazapine vs buspar to help with her mood.  Reasonable to dec xanax to BID PRN

## 2019-09-20 NOTE — Assessment & Plan Note (Signed)
She has a decrease in appetite. Reasonable to hold fosamax for now and see if appetite would improve off fosamax.

## 2019-09-21 ENCOUNTER — Telehealth: Payer: Self-pay | Admitting: Family Medicine

## 2019-09-21 ENCOUNTER — Inpatient Hospital Stay: Payer: Medicare PPO | Attending: Oncology | Admitting: Oncology

## 2019-09-21 DIAGNOSIS — I712 Thoracic aortic aneurysm, without rupture, unspecified: Secondary | ICD-10-CM

## 2019-09-21 DIAGNOSIS — M818 Other osteoporosis without current pathological fracture: Secondary | ICD-10-CM

## 2019-09-21 DIAGNOSIS — C50411 Malignant neoplasm of upper-outer quadrant of right female breast: Secondary | ICD-10-CM | POA: Diagnosis not present

## 2019-09-21 DIAGNOSIS — E559 Vitamin D deficiency, unspecified: Secondary | ICD-10-CM

## 2019-09-21 DIAGNOSIS — Z170 Estrogen receptor positive status [ER+]: Secondary | ICD-10-CM | POA: Diagnosis not present

## 2019-09-21 MED ORDER — ALENDRONATE SODIUM 70 MG PO TABS: 70.0000 mg | ORAL_TABLET | ORAL | 6 refills | Status: DC

## 2019-09-21 MED ORDER — ANASTROZOLE 1 MG PO TABS: 1.0000 mg | ORAL_TABLET | Freq: Every day | ORAL | 4 refills | Status: DC

## 2019-09-21 MED ORDER — BUSPIRONE HCL 5 MG PO TABS: 5.0000 mg | ORAL_TABLET | Freq: Two times a day (BID) | ORAL | 1 refills | Status: DC

## 2019-09-21 NOTE — Telephone Encounter (Signed)
Please check with the daughter.  I would not increase her Cymbalta.  She can add on BuSpar twice a day and see if that helps with her mood.  I sent the prescription.  Please check with home health and see if they can draw CBC at home.  Diagnosis = anemia.  I want to make sure her hemoglobin has not changed dramatically in the meantime.  Thanks.

## 2019-09-21 NOTE — Telephone Encounter (Addendum)
Left message with daughter's husband to return call.    Daughter, [name] returned call and states she will pick up the prescription.  Daughter also states that she has noticed that her mother's memory has gotten considerably worse since she came from the hospital and she is finding herself telling the patient the same thing day after day.  Daughter says patient was taking something for her memory a few years ago (from Dr. Gaynell Face) but discontinued taking it saying that she didn't need it.  Daughter says she likely needs it now.    Order given to [facility] for CBC, Dx: Anemia.  Alvis Lemmings says nurse will go out on Wednesday and do labs and report should come in by fax on Thursday morning.

## 2019-09-21 NOTE — Telephone Encounter (Signed)
Daughter advised.

## 2019-09-21 NOTE — Telephone Encounter (Signed)
I get the point about the memory but I would only make 1 change at a time.  The recent illness/upheaval could have altered her memory/memory deficit.  I would see how she does with buspar, get the labs done, and then go from there.  She may have some clearing of mentation in the meantime.  If not, then let me know. I thank her for the update.

## 2019-09-22 ENCOUNTER — Other Ambulatory Visit: Payer: Self-pay

## 2019-09-22 ENCOUNTER — Other Ambulatory Visit: Payer: Self-pay | Admitting: *Deleted

## 2019-09-22 ENCOUNTER — Telehealth: Payer: Self-pay | Admitting: Interventional Cardiology

## 2019-09-22 ENCOUNTER — Telehealth: Payer: Self-pay | Admitting: Oncology

## 2019-09-22 DIAGNOSIS — M47816 Spondylosis without myelopathy or radiculopathy, lumbar region: Secondary | ICD-10-CM | POA: Diagnosis not present

## 2019-09-22 DIAGNOSIS — D631 Anemia in chronic kidney disease: Secondary | ICD-10-CM | POA: Diagnosis not present

## 2019-09-22 DIAGNOSIS — M519 Unspecified thoracic, thoracolumbar and lumbosacral intervertebral disc disorder: Secondary | ICD-10-CM | POA: Diagnosis not present

## 2019-09-22 DIAGNOSIS — N189 Chronic kidney disease, unspecified: Secondary | ICD-10-CM | POA: Diagnosis not present

## 2019-09-22 DIAGNOSIS — D62 Acute posthemorrhagic anemia: Secondary | ICD-10-CM | POA: Diagnosis not present

## 2019-09-22 DIAGNOSIS — I129 Hypertensive chronic kidney disease with stage 1 through stage 4 chronic kidney disease, or unspecified chronic kidney disease: Secondary | ICD-10-CM | POA: Diagnosis not present

## 2019-09-22 DIAGNOSIS — M160 Bilateral primary osteoarthritis of hip: Secondary | ICD-10-CM | POA: Diagnosis not present

## 2019-09-22 DIAGNOSIS — M810 Age-related osteoporosis without current pathological fracture: Secondary | ICD-10-CM | POA: Diagnosis not present

## 2019-09-22 DIAGNOSIS — J449 Chronic obstructive pulmonary disease, unspecified: Secondary | ICD-10-CM | POA: Diagnosis not present

## 2019-09-22 NOTE — Telephone Encounter (Signed)
She should still have refills on this at [facility] drug.  If that is not the case then please let me know.  Thanks.

## 2019-09-22 NOTE — Telephone Encounter (Signed)
Scheduled appts per 3/15 sch msg. Pt confirmed new appt date and times.

## 2019-09-22 NOTE — Telephone Encounter (Signed)
Spoke with Juliann Pulse at [facility] and she states that patient has opted to use their pharmacy services - so all future prescriptions can be sent to them. Juliann Pulse takes it will be about a month before patient can be processed through their system though.    For now, patient needs a refill on Alprazolam sent to [facility]. This was last refilled on 08/25/19 for #45 tablets with 2 refills.  Patient was last seen on 09/18/19.  Dr. Damita Dunnings, is this ok to refill?

## 2019-09-22 NOTE — Telephone Encounter (Signed)
Pt's preferred pharmacy has been added to her pharmacy list.

## 2019-09-22 NOTE — Telephone Encounter (Signed)
New Message    [name] is calling from [facility] and says the pt has signed onto their Pharmacy and that all future prescriptions can be sent to them Fax [phone]

## 2019-09-23 DIAGNOSIS — D62 Acute posthemorrhagic anemia: Secondary | ICD-10-CM | POA: Diagnosis not present

## 2019-09-23 DIAGNOSIS — J449 Chronic obstructive pulmonary disease, unspecified: Secondary | ICD-10-CM | POA: Diagnosis not present

## 2019-09-23 DIAGNOSIS — M810 Age-related osteoporosis without current pathological fracture: Secondary | ICD-10-CM | POA: Diagnosis not present

## 2019-09-23 DIAGNOSIS — N189 Chronic kidney disease, unspecified: Secondary | ICD-10-CM | POA: Diagnosis not present

## 2019-09-23 DIAGNOSIS — I129 Hypertensive chronic kidney disease with stage 1 through stage 4 chronic kidney disease, or unspecified chronic kidney disease: Secondary | ICD-10-CM | POA: Diagnosis not present

## 2019-09-23 DIAGNOSIS — M519 Unspecified thoracic, thoracolumbar and lumbosacral intervertebral disc disorder: Secondary | ICD-10-CM | POA: Diagnosis not present

## 2019-09-23 DIAGNOSIS — M47816 Spondylosis without myelopathy or radiculopathy, lumbar region: Secondary | ICD-10-CM | POA: Diagnosis not present

## 2019-09-23 DIAGNOSIS — M160 Bilateral primary osteoarthritis of hip: Secondary | ICD-10-CM | POA: Diagnosis not present

## 2019-09-23 DIAGNOSIS — D631 Anemia in chronic kidney disease: Secondary | ICD-10-CM | POA: Diagnosis not present

## 2019-09-24 ENCOUNTER — Other Ambulatory Visit: Payer: Self-pay | Admitting: Family Medicine

## 2019-09-24 ENCOUNTER — Telehealth: Payer: Self-pay | Admitting: *Deleted

## 2019-09-24 DIAGNOSIS — M47816 Spondylosis without myelopathy or radiculopathy, lumbar region: Secondary | ICD-10-CM | POA: Diagnosis not present

## 2019-09-24 DIAGNOSIS — D62 Acute posthemorrhagic anemia: Secondary | ICD-10-CM | POA: Diagnosis not present

## 2019-09-24 DIAGNOSIS — J449 Chronic obstructive pulmonary disease, unspecified: Secondary | ICD-10-CM | POA: Diagnosis not present

## 2019-09-24 DIAGNOSIS — I129 Hypertensive chronic kidney disease with stage 1 through stage 4 chronic kidney disease, or unspecified chronic kidney disease: Secondary | ICD-10-CM | POA: Diagnosis not present

## 2019-09-24 DIAGNOSIS — M519 Unspecified thoracic, thoracolumbar and lumbosacral intervertebral disc disorder: Secondary | ICD-10-CM | POA: Diagnosis not present

## 2019-09-24 DIAGNOSIS — M160 Bilateral primary osteoarthritis of hip: Secondary | ICD-10-CM | POA: Diagnosis not present

## 2019-09-24 DIAGNOSIS — N189 Chronic kidney disease, unspecified: Secondary | ICD-10-CM | POA: Diagnosis not present

## 2019-09-24 DIAGNOSIS — D631 Anemia in chronic kidney disease: Secondary | ICD-10-CM | POA: Diagnosis not present

## 2019-09-24 DIAGNOSIS — M810 Age-related osteoporosis without current pathological fracture: Secondary | ICD-10-CM | POA: Diagnosis not present

## 2019-09-24 MED ORDER — CLOPIDOGREL BISULFATE 75 MG PO TABS: 75.0000 mg | ORAL_TABLET | Freq: Every day | ORAL | 1 refills | Status: AC

## 2019-09-24 MED ORDER — NITROGLYCERIN 0.4 MG SL SUBL: 0.4000 mg | SUBLINGUAL_TABLET | SUBLINGUAL | 3 refills | Status: DC | PRN

## 2019-09-24 NOTE — Telephone Encounter (Signed)
Last office visit 09/18/2019 for Hospital Follow Up.  Last refilled Alprazolam 08/25/2019 for #45 with 2 refills at [facility].  Patient has since changed pharmacy.  Diclofenac 1% gel 08/26/2017 for 200 g with 3 refills.  No future appointments with PCP.

## 2019-09-24 NOTE — Telephone Encounter (Signed)
Pharmacy called to requesting refills on Plavix  90 day 1 refill and Nitro   25 3 refill

## 2019-09-25 ENCOUNTER — Encounter: Payer: Self-pay | Admitting: Family Medicine

## 2019-09-25 ENCOUNTER — Telehealth: Payer: Self-pay | Admitting: Interventional Cardiology

## 2019-09-25 MED ORDER — PANTOPRAZOLE SODIUM 20 MG PO TBEC: 20.0000 mg | DELAYED_RELEASE_TABLET | Freq: Two times a day (BID) | ORAL | 3 refills | Status: DC

## 2019-09-25 NOTE — Telephone Encounter (Signed)
Pharmacist left a voicemail stating that he called the cardiologist about a medication change and was advised to call the PCP. Please advised.

## 2019-09-25 NOTE — Addendum Note (Signed)
Addended by: Ria Bush on: 09/25/2019 06:41 PM   Modules accepted: Orders

## 2019-09-25 NOTE — Telephone Encounter (Signed)
ERx 

## 2019-09-25 NOTE — Telephone Encounter (Signed)
Pt's pharmacy [name] is calling stating that there is an interaction between omeprazole and clopidogrel and would like to know if Dr. Tamala Julian would still like for the pt to take both of these medication. Please address

## 2019-09-25 NOTE — Telephone Encounter (Signed)
Please disregard this encounter.

## 2019-09-25 NOTE — Telephone Encounter (Signed)
I've sent in pantoprazole 20mg  to take bid in place of omeprazole.  plz notify pt.

## 2019-09-25 NOTE — Telephone Encounter (Signed)
Spoke with Pharmacist at [facility] and advised pt should not be taking both.  Advised PCP normally prescribes Omeprazole and to reach out to them for a substitution.  Pharmacist appreciative for call.

## 2019-09-26 ENCOUNTER — Telehealth: Payer: Self-pay | Admitting: Family Medicine

## 2019-09-26 DIAGNOSIS — G9341 Metabolic encephalopathy: Secondary | ICD-10-CM

## 2019-09-26 DIAGNOSIS — J449 Chronic obstructive pulmonary disease, unspecified: Secondary | ICD-10-CM | POA: Diagnosis not present

## 2019-09-26 DIAGNOSIS — D539 Nutritional anemia, unspecified: Secondary | ICD-10-CM

## 2019-09-26 DIAGNOSIS — M810 Age-related osteoporosis without current pathological fracture: Secondary | ICD-10-CM | POA: Diagnosis not present

## 2019-09-26 DIAGNOSIS — G8929 Other chronic pain: Secondary | ICD-10-CM

## 2019-09-26 DIAGNOSIS — Z9981 Dependence on supplemental oxygen: Secondary | ICD-10-CM

## 2019-09-26 DIAGNOSIS — M47816 Spondylosis without myelopathy or radiculopathy, lumbar region: Secondary | ICD-10-CM | POA: Diagnosis not present

## 2019-09-26 DIAGNOSIS — N189 Chronic kidney disease, unspecified: Secondary | ICD-10-CM | POA: Diagnosis not present

## 2019-09-26 DIAGNOSIS — M8588 Other specified disorders of bone density and structure, other site: Secondary | ICD-10-CM

## 2019-09-26 DIAGNOSIS — Z7902 Long term (current) use of antithrombotics/antiplatelets: Secondary | ICD-10-CM

## 2019-09-26 DIAGNOSIS — H9190 Unspecified hearing loss, unspecified ear: Secondary | ICD-10-CM

## 2019-09-26 DIAGNOSIS — D62 Acute posthemorrhagic anemia: Secondary | ICD-10-CM | POA: Diagnosis not present

## 2019-09-26 DIAGNOSIS — I129 Hypertensive chronic kidney disease with stage 1 through stage 4 chronic kidney disease, or unspecified chronic kidney disease: Secondary | ICD-10-CM | POA: Diagnosis not present

## 2019-09-26 DIAGNOSIS — E7849 Other hyperlipidemia: Secondary | ICD-10-CM

## 2019-09-26 DIAGNOSIS — G4700 Insomnia, unspecified: Secondary | ICD-10-CM

## 2019-09-26 DIAGNOSIS — J309 Allergic rhinitis, unspecified: Secondary | ICD-10-CM

## 2019-09-26 DIAGNOSIS — M160 Bilateral primary osteoarthritis of hip: Secondary | ICD-10-CM | POA: Diagnosis not present

## 2019-09-26 DIAGNOSIS — Z87891 Personal history of nicotine dependence: Secondary | ICD-10-CM

## 2019-09-26 DIAGNOSIS — Z853 Personal history of malignant neoplasm of breast: Secondary | ICD-10-CM

## 2019-09-26 DIAGNOSIS — Z8673 Personal history of transient ischemic attack (TIA), and cerebral infarction without residual deficits: Secondary | ICD-10-CM

## 2019-09-26 DIAGNOSIS — Z96611 Presence of right artificial shoulder joint: Secondary | ICD-10-CM

## 2019-09-26 DIAGNOSIS — Z955 Presence of coronary angioplasty implant and graft: Secondary | ICD-10-CM

## 2019-09-26 DIAGNOSIS — K5730 Diverticulosis of large intestine without perforation or abscess without bleeding: Secondary | ICD-10-CM

## 2019-09-26 DIAGNOSIS — Z79811 Long term (current) use of aromatase inhibitors: Secondary | ICD-10-CM

## 2019-09-26 DIAGNOSIS — D631 Anemia in chronic kidney disease: Secondary | ICD-10-CM | POA: Diagnosis not present

## 2019-09-26 DIAGNOSIS — M519 Unspecified thoracic, thoracolumbar and lumbosacral intervertebral disc disorder: Secondary | ICD-10-CM | POA: Diagnosis not present

## 2019-09-26 DIAGNOSIS — I6782 Cerebral ischemia: Secondary | ICD-10-CM

## 2019-09-26 DIAGNOSIS — K219 Gastro-esophageal reflux disease without esophagitis: Secondary | ICD-10-CM

## 2019-09-26 DIAGNOSIS — E039 Hypothyroidism, unspecified: Secondary | ICD-10-CM

## 2019-09-26 DIAGNOSIS — Z96651 Presence of right artificial knee joint: Secondary | ICD-10-CM

## 2019-09-26 DIAGNOSIS — F419 Anxiety disorder, unspecified: Secondary | ICD-10-CM

## 2019-09-26 DIAGNOSIS — Z9181 History of falling: Secondary | ICD-10-CM

## 2019-09-26 NOTE — Telephone Encounter (Signed)
-----   Message from Chauncey Cruel, MD sent at 09/24/2019  3:46 PM EDT ----- Thank you-- very reasonable intervention  GM ----- Message ----- From: Tonia Ghent, MD Sent: 09/22/2019   9:04 AM EDT To: Chauncey Cruel, MD  I can explain the change with alendronate.  Given all of her recent issues, along with chest pain and decrease in appetite, I asked patient/family to temporarily hold alendronate to see if that was in any way contributing to her symptoms.  Her memory is worsening meantime and her mood has been worse.  We are trying to address all of that.  Thank you for seeing her.  Brigitte Pulse

## 2019-09-27 NOTE — Telephone Encounter (Signed)
Agree with PPI substitution.  Thanks.

## 2019-09-28 ENCOUNTER — Telehealth: Payer: Self-pay | Admitting: *Deleted

## 2019-09-28 DIAGNOSIS — M810 Age-related osteoporosis without current pathological fracture: Secondary | ICD-10-CM | POA: Diagnosis not present

## 2019-09-28 DIAGNOSIS — M160 Bilateral primary osteoarthritis of hip: Secondary | ICD-10-CM | POA: Diagnosis not present

## 2019-09-28 DIAGNOSIS — J449 Chronic obstructive pulmonary disease, unspecified: Secondary | ICD-10-CM | POA: Diagnosis not present

## 2019-09-28 DIAGNOSIS — N189 Chronic kidney disease, unspecified: Secondary | ICD-10-CM | POA: Diagnosis not present

## 2019-09-28 DIAGNOSIS — I129 Hypertensive chronic kidney disease with stage 1 through stage 4 chronic kidney disease, or unspecified chronic kidney disease: Secondary | ICD-10-CM | POA: Diagnosis not present

## 2019-09-28 DIAGNOSIS — M47816 Spondylosis without myelopathy or radiculopathy, lumbar region: Secondary | ICD-10-CM | POA: Diagnosis not present

## 2019-09-28 DIAGNOSIS — D62 Acute posthemorrhagic anemia: Secondary | ICD-10-CM | POA: Diagnosis not present

## 2019-09-28 DIAGNOSIS — M519 Unspecified thoracic, thoracolumbar and lumbosacral intervertebral disc disorder: Secondary | ICD-10-CM | POA: Diagnosis not present

## 2019-09-28 DIAGNOSIS — D631 Anemia in chronic kidney disease: Secondary | ICD-10-CM | POA: Diagnosis not present

## 2019-09-28 MED ORDER — DULOXETINE HCL 60 MG PO CPEP: ORAL_CAPSULE | ORAL | 1 refills | Status: DC

## 2019-09-28 MED ORDER — PROAIR HFA 108 (90 BASE) MCG/ACT IN AERS: 1.0000 | INHALATION_SPRAY | Freq: Four times a day (QID) | RESPIRATORY_TRACT | 3 refills | Status: DC | PRN

## 2019-09-28 MED ORDER — LEVOTHYROXINE SODIUM 112 MCG PO TABS: ORAL_TABLET | ORAL | 1 refills | Status: DC

## 2019-09-28 NOTE — Telephone Encounter (Signed)
Spoke with pt relaying Dr. G's message. Pt verbalizes understanding.  

## 2019-09-28 NOTE — Telephone Encounter (Signed)
Electronic refill request. Alprazolam Last office visit:   09/18/2019 Last Filled:    135 tablet 0 09/25/2019    Electronic refill request. Trazodone Last office visit:   09/18/2019 Last Filled:    270 tablet 1 09/24/2019    Electronic refill request. Diclofenac Last office visit:   09/18/2019 Last Filled:    400 g 0 09/25/2019   Please advise.

## 2019-09-29 ENCOUNTER — Inpatient Hospital Stay: Admission: RE | Admit: 2019-09-29 | Payer: Medicare PPO | Source: Ambulatory Visit

## 2019-09-29 ENCOUNTER — Ambulatory Visit
Admission: RE | Admit: 2019-09-29 | Discharge: 2019-09-29 | Disposition: A | Discharge status: Home / Self Care | Payer: Medicare PPO | Source: Ambulatory Visit | Attending: Oncology | Admitting: Oncology

## 2019-09-29 ENCOUNTER — Other Ambulatory Visit: Payer: Self-pay | Admitting: Oncology

## 2019-09-29 ENCOUNTER — Other Ambulatory Visit: Payer: Self-pay

## 2019-09-29 DIAGNOSIS — Z170 Estrogen receptor positive status [ER+]: Secondary | ICD-10-CM

## 2019-09-29 DIAGNOSIS — E559 Vitamin D deficiency, unspecified: Secondary | ICD-10-CM

## 2019-09-29 DIAGNOSIS — C50411 Malignant neoplasm of upper-outer quadrant of right female breast: Secondary | ICD-10-CM

## 2019-09-29 DIAGNOSIS — Z853 Personal history of malignant neoplasm of breast: Secondary | ICD-10-CM | POA: Diagnosis not present

## 2019-09-29 DIAGNOSIS — M818 Other osteoporosis without current pathological fracture: Secondary | ICD-10-CM

## 2019-09-29 DIAGNOSIS — I712 Thoracic aortic aneurysm, without rupture, unspecified: Secondary | ICD-10-CM

## 2019-09-29 DIAGNOSIS — N6311 Unspecified lump in the right breast, upper outer quadrant: Secondary | ICD-10-CM | POA: Diagnosis not present

## 2019-09-29 DIAGNOSIS — R928 Other abnormal and inconclusive findings on diagnostic imaging of breast: Secondary | ICD-10-CM | POA: Diagnosis not present

## 2019-09-29 MED ORDER — TRAZODONE HCL 50 MG PO TABS: ORAL_TABLET | ORAL | 1 refills | Status: DC

## 2019-09-29 MED ORDER — ALPRAZOLAM 1 MG PO TABS: ORAL_TABLET | ORAL | 0 refills | Status: DC

## 2019-09-29 MED ORDER — DICLOFENAC SODIUM 1 % EX GEL: CUTANEOUS | 1 refills | Status: DC

## 2019-09-29 NOTE — Telephone Encounter (Signed)
Sent.  We previously talked about decreasing her alprazolam down to twice a day as needed.  Sig adjusted.

## 2019-09-30 DIAGNOSIS — D631 Anemia in chronic kidney disease: Secondary | ICD-10-CM | POA: Diagnosis not present

## 2019-09-30 DIAGNOSIS — J449 Chronic obstructive pulmonary disease, unspecified: Secondary | ICD-10-CM | POA: Diagnosis not present

## 2019-09-30 DIAGNOSIS — I129 Hypertensive chronic kidney disease with stage 1 through stage 4 chronic kidney disease, or unspecified chronic kidney disease: Secondary | ICD-10-CM | POA: Diagnosis not present

## 2019-09-30 DIAGNOSIS — N189 Chronic kidney disease, unspecified: Secondary | ICD-10-CM | POA: Diagnosis not present

## 2019-09-30 DIAGNOSIS — M160 Bilateral primary osteoarthritis of hip: Secondary | ICD-10-CM | POA: Diagnosis not present

## 2019-09-30 DIAGNOSIS — D62 Acute posthemorrhagic anemia: Secondary | ICD-10-CM | POA: Diagnosis not present

## 2019-09-30 DIAGNOSIS — M47816 Spondylosis without myelopathy or radiculopathy, lumbar region: Secondary | ICD-10-CM | POA: Diagnosis not present

## 2019-09-30 DIAGNOSIS — M810 Age-related osteoporosis without current pathological fracture: Secondary | ICD-10-CM | POA: Diagnosis not present

## 2019-09-30 DIAGNOSIS — M519 Unspecified thoracic, thoracolumbar and lumbosacral intervertebral disc disorder: Secondary | ICD-10-CM | POA: Diagnosis not present

## 2019-10-01 ENCOUNTER — Encounter: Payer: Self-pay | Admitting: Oncology

## 2019-10-02 DIAGNOSIS — I129 Hypertensive chronic kidney disease with stage 1 through stage 4 chronic kidney disease, or unspecified chronic kidney disease: Secondary | ICD-10-CM | POA: Diagnosis not present

## 2019-10-02 DIAGNOSIS — N189 Chronic kidney disease, unspecified: Secondary | ICD-10-CM | POA: Diagnosis not present

## 2019-10-02 DIAGNOSIS — D62 Acute posthemorrhagic anemia: Secondary | ICD-10-CM | POA: Diagnosis not present

## 2019-10-02 DIAGNOSIS — M519 Unspecified thoracic, thoracolumbar and lumbosacral intervertebral disc disorder: Secondary | ICD-10-CM | POA: Diagnosis not present

## 2019-10-02 DIAGNOSIS — M810 Age-related osteoporosis without current pathological fracture: Secondary | ICD-10-CM | POA: Diagnosis not present

## 2019-10-02 DIAGNOSIS — D631 Anemia in chronic kidney disease: Secondary | ICD-10-CM | POA: Diagnosis not present

## 2019-10-02 DIAGNOSIS — M160 Bilateral primary osteoarthritis of hip: Secondary | ICD-10-CM | POA: Diagnosis not present

## 2019-10-02 DIAGNOSIS — J449 Chronic obstructive pulmonary disease, unspecified: Secondary | ICD-10-CM | POA: Diagnosis not present

## 2019-10-02 DIAGNOSIS — M47816 Spondylosis without myelopathy or radiculopathy, lumbar region: Secondary | ICD-10-CM | POA: Diagnosis not present

## 2019-10-06 DIAGNOSIS — M160 Bilateral primary osteoarthritis of hip: Secondary | ICD-10-CM | POA: Diagnosis not present

## 2019-10-06 DIAGNOSIS — M810 Age-related osteoporosis without current pathological fracture: Secondary | ICD-10-CM | POA: Diagnosis not present

## 2019-10-06 DIAGNOSIS — D631 Anemia in chronic kidney disease: Secondary | ICD-10-CM | POA: Diagnosis not present

## 2019-10-06 DIAGNOSIS — M47816 Spondylosis without myelopathy or radiculopathy, lumbar region: Secondary | ICD-10-CM | POA: Diagnosis not present

## 2019-10-06 DIAGNOSIS — N189 Chronic kidney disease, unspecified: Secondary | ICD-10-CM | POA: Diagnosis not present

## 2019-10-06 DIAGNOSIS — I129 Hypertensive chronic kidney disease with stage 1 through stage 4 chronic kidney disease, or unspecified chronic kidney disease: Secondary | ICD-10-CM | POA: Diagnosis not present

## 2019-10-06 DIAGNOSIS — M519 Unspecified thoracic, thoracolumbar and lumbosacral intervertebral disc disorder: Secondary | ICD-10-CM | POA: Diagnosis not present

## 2019-10-06 DIAGNOSIS — J449 Chronic obstructive pulmonary disease, unspecified: Secondary | ICD-10-CM | POA: Diagnosis not present

## 2019-10-06 DIAGNOSIS — D62 Acute posthemorrhagic anemia: Secondary | ICD-10-CM | POA: Diagnosis not present

## 2019-10-07 DIAGNOSIS — M519 Unspecified thoracic, thoracolumbar and lumbosacral intervertebral disc disorder: Secondary | ICD-10-CM | POA: Diagnosis not present

## 2019-10-07 DIAGNOSIS — D631 Anemia in chronic kidney disease: Secondary | ICD-10-CM | POA: Diagnosis not present

## 2019-10-07 DIAGNOSIS — J449 Chronic obstructive pulmonary disease, unspecified: Secondary | ICD-10-CM | POA: Diagnosis not present

## 2019-10-07 DIAGNOSIS — M47816 Spondylosis without myelopathy or radiculopathy, lumbar region: Secondary | ICD-10-CM | POA: Diagnosis not present

## 2019-10-07 DIAGNOSIS — I129 Hypertensive chronic kidney disease with stage 1 through stage 4 chronic kidney disease, or unspecified chronic kidney disease: Secondary | ICD-10-CM | POA: Diagnosis not present

## 2019-10-07 DIAGNOSIS — D62 Acute posthemorrhagic anemia: Secondary | ICD-10-CM | POA: Diagnosis not present

## 2019-10-07 DIAGNOSIS — M810 Age-related osteoporosis without current pathological fracture: Secondary | ICD-10-CM | POA: Diagnosis not present

## 2019-10-07 DIAGNOSIS — M160 Bilateral primary osteoarthritis of hip: Secondary | ICD-10-CM | POA: Diagnosis not present

## 2019-10-07 DIAGNOSIS — N189 Chronic kidney disease, unspecified: Secondary | ICD-10-CM | POA: Diagnosis not present

## 2019-10-10 DIAGNOSIS — J449 Chronic obstructive pulmonary disease, unspecified: Secondary | ICD-10-CM | POA: Diagnosis not present

## 2019-10-10 DIAGNOSIS — N189 Chronic kidney disease, unspecified: Secondary | ICD-10-CM | POA: Diagnosis not present

## 2019-10-10 DIAGNOSIS — M810 Age-related osteoporosis without current pathological fracture: Secondary | ICD-10-CM | POA: Diagnosis not present

## 2019-10-10 DIAGNOSIS — D62 Acute posthemorrhagic anemia: Secondary | ICD-10-CM | POA: Diagnosis not present

## 2019-10-10 DIAGNOSIS — I129 Hypertensive chronic kidney disease with stage 1 through stage 4 chronic kidney disease, or unspecified chronic kidney disease: Secondary | ICD-10-CM | POA: Diagnosis not present

## 2019-10-10 DIAGNOSIS — M47816 Spondylosis without myelopathy or radiculopathy, lumbar region: Secondary | ICD-10-CM | POA: Diagnosis not present

## 2019-10-10 DIAGNOSIS — M160 Bilateral primary osteoarthritis of hip: Secondary | ICD-10-CM | POA: Diagnosis not present

## 2019-10-10 DIAGNOSIS — M519 Unspecified thoracic, thoracolumbar and lumbosacral intervertebral disc disorder: Secondary | ICD-10-CM | POA: Diagnosis not present

## 2019-10-10 DIAGNOSIS — D631 Anemia in chronic kidney disease: Secondary | ICD-10-CM | POA: Diagnosis not present

## 2019-10-14 ENCOUNTER — Telehealth: Payer: Self-pay | Admitting: Family Medicine

## 2019-10-14 DIAGNOSIS — S92405A Nondisplaced unspecified fracture of left great toe, initial encounter for closed fracture: Secondary | ICD-10-CM | POA: Diagnosis not present

## 2019-10-14 DIAGNOSIS — S8002XA Contusion of left knee, initial encounter: Secondary | ICD-10-CM | POA: Diagnosis not present

## 2019-10-14 DIAGNOSIS — S92425A Nondisplaced fracture of distal phalanx of left great toe, initial encounter for closed fracture: Secondary | ICD-10-CM | POA: Diagnosis not present

## 2019-10-14 DIAGNOSIS — T148XXA Other injury of unspecified body region, initial encounter: Secondary | ICD-10-CM | POA: Diagnosis not present

## 2019-10-14 NOTE — Telephone Encounter (Signed)
Noted. Thanks.

## 2019-10-14 NOTE — Telephone Encounter (Signed)
Spoke with patient daughter, states that [name] fell about 2 days ago in the living room and she is just now finding out that the patient fell. Pt is having severe bruising on left foot, throbbing pain and a blood blister that popped up last night about 1" long, raised and egg-shaped on top of the foot. Patient knee is now also swollen, leg leg.  Pt has been icing the foot and is not sure what else to do further.   I spoke with Dr Darnell Level for recommendations, we have no openings this afternoon in office for the patient to be evaluated.  Dr Darnell Level recommended that she go to the UC to have this looked at as she may need an xray to rule out a fracture or break.   Daughter spoke with the patient briefly and they requested an OV with Dr Damita Dunnings tomorrow -- I advised that his schedule is already filled up at this time but that I would send him a message to advise further. Patient is aware that he is out of the office today so he may not intercept this message until tomorrow morning -- if they do not hear anything back by 2pm today the daughter stated that she would take the patient to UC.   Will send to Dr Damita Dunnings to advise -- pt will go to UC this afternoon if no further recommendations given by 2pm  Will send to triage Mearl Latin and Rollene Fare) to follow up with patient this afternoon to ensure evaluation.

## 2019-10-14 NOTE — Telephone Encounter (Signed)
Spoke with patient Daughter [name] and she states that she was reading online how to treat blood blisters she read different ways to treat that but may still take her to UC to make sure nothing is broken.   She states that it going to be hard to get her out of the house bc she feels so bad today but she is going to try to get her to UC  Will send to Dr Damita Dunnings as Juluis Rainier.

## 2019-10-14 NOTE — Telephone Encounter (Signed)
[name] (DPR signed) said that it was an ordeal but pt is presently in the car and on the way to the UC at [facility]. FYI to Dr Damita Dunnings.

## 2019-10-14 NOTE — Telephone Encounter (Signed)
I am working remotely today and my schedule has been limited by mult family emergencies.   Given her situation and the fact that I am out of office today, I think it makes sense to get checked today at [facility].   I thank all involved.

## 2019-10-16 ENCOUNTER — Encounter: Payer: Self-pay | Admitting: Family Medicine

## 2019-10-16 ENCOUNTER — Telehealth: Payer: Self-pay

## 2019-10-16 LAB — LAB REPORT - SCANNED: Hemoglobin: 9.4

## 2019-10-16 MED ORDER — FLUTICASONE PROPIONATE 50 MCG/ACT NA SUSP: 2.0000 | Freq: Every day | NASAL | 1 refills | Status: DC

## 2019-10-16 MED ORDER — TRAMADOL HCL 50 MG PO TABS: 50.0000 mg | ORAL_TABLET | Freq: Three times a day (TID) | ORAL | 0 refills | Status: DC | PRN

## 2019-10-16 NOTE — Telephone Encounter (Signed)
Would try taking flonase to see if that helps with sneezing, post nasal gtt.  2 sprays per nostril per day.  Would try elevating her leg/foot, ice as needed for 5 minutes at a time, and try tramadol for pain with sedation caution. Sent. Thanks.  Let me know if that isn't helping. I hope she feels better soon.  Will await ortho notes.

## 2019-10-16 NOTE — Telephone Encounter (Signed)
Patient advised.

## 2019-10-16 NOTE — Telephone Encounter (Signed)
Patient had 2 questions: 1) patient developed severe runny nose, sneezing, some post nasal drip the past 1 to 2 days. No fever. No chills. Does have SOB and cough but she has COPD and this is about the same. O2 is 93% right now on 3 1/2 liters of oxygen and pulse is 73. She has taking Zyrtec but has not tried anything else. What else can she take/try?  2) Also patient states she went to Urgent Care on 10/14/19 and from there was sent to [facility] and [facility] office and was told she broke her left big toe and has this wrapped up. She states she was not given any medications for pain. She has been taking tylenol but this has not helped a lot. She has throbbing pain. Can patient get something stronger to take?

## 2019-10-21 DIAGNOSIS — J449 Chronic obstructive pulmonary disease, unspecified: Secondary | ICD-10-CM | POA: Diagnosis not present

## 2019-10-21 DIAGNOSIS — D62 Acute posthemorrhagic anemia: Secondary | ICD-10-CM | POA: Diagnosis not present

## 2019-10-21 DIAGNOSIS — I129 Hypertensive chronic kidney disease with stage 1 through stage 4 chronic kidney disease, or unspecified chronic kidney disease: Secondary | ICD-10-CM | POA: Diagnosis not present

## 2019-10-21 DIAGNOSIS — M519 Unspecified thoracic, thoracolumbar and lumbosacral intervertebral disc disorder: Secondary | ICD-10-CM | POA: Diagnosis not present

## 2019-10-21 DIAGNOSIS — D631 Anemia in chronic kidney disease: Secondary | ICD-10-CM | POA: Diagnosis not present

## 2019-10-21 DIAGNOSIS — M47816 Spondylosis without myelopathy or radiculopathy, lumbar region: Secondary | ICD-10-CM | POA: Diagnosis not present

## 2019-10-21 DIAGNOSIS — M160 Bilateral primary osteoarthritis of hip: Secondary | ICD-10-CM | POA: Diagnosis not present

## 2019-10-21 DIAGNOSIS — M810 Age-related osteoporosis without current pathological fracture: Secondary | ICD-10-CM | POA: Diagnosis not present

## 2019-10-21 DIAGNOSIS — N189 Chronic kidney disease, unspecified: Secondary | ICD-10-CM | POA: Diagnosis not present

## 2019-10-27 ENCOUNTER — Telehealth: Payer: Self-pay | Admitting: *Deleted

## 2019-10-27 DIAGNOSIS — D649 Anemia, unspecified: Secondary | ICD-10-CM

## 2019-10-27 NOTE — Telephone Encounter (Signed)
On the patient's last faxed lab report from [facility], you had requested a repeat CBC in a few weeks.  The patient stated that the [facility] nurse was coming the next day and she would ask about the repeat test.  No results have been received.  HH now says they have discharged the patient.  If you still want the repeat CBC, patient says she can probably come here for the test.  Please advise.

## 2019-10-28 NOTE — Telephone Encounter (Signed)
Reasonable to get CBC done by the easiest means possible for the patient.  This could be a lab visit here or a lab visit at [facility] if that is closer for the patient.  I put in the follow-up order.  If she is going to get it done here, if she able to come in the clinic?  Can it be drawn in the parking lot at the car?  Thanks.

## 2019-10-29 NOTE — Telephone Encounter (Signed)
Patient advised.  Lab appt scheduled.  

## 2019-10-30 ENCOUNTER — Other Ambulatory Visit: Payer: Self-pay | Admitting: Family Medicine

## 2019-10-30 MED ORDER — ALBUTEROL SULFATE HFA 108 (90 BASE) MCG/ACT IN AERS: 1.0000 | INHALATION_SPRAY | Freq: Four times a day (QID) | RESPIRATORY_TRACT | 3 refills | Status: DC | PRN

## 2019-11-02 ENCOUNTER — Other Ambulatory Visit (INDEPENDENT_AMBULATORY_CARE_PROVIDER_SITE_OTHER): Payer: Medicare PPO

## 2019-11-02 ENCOUNTER — Other Ambulatory Visit: Payer: Self-pay

## 2019-11-02 DIAGNOSIS — D649 Anemia, unspecified: Secondary | ICD-10-CM | POA: Diagnosis not present

## 2019-11-02 LAB — CBC WITH DIFFERENTIAL/PLATELET
Basophils Absolute: 0.1 10*3/uL (ref 0.0–0.1)
Basophils Relative: 1.4 % (ref 0.0–3.0)
Eosinophils Absolute: 0.2 10*3/uL (ref 0.0–0.7)
Eosinophils Relative: 3.9 % (ref 0.0–5.0)
HCT: 31.2 % — ABNORMAL LOW (ref 36.0–46.0)
Hemoglobin: 10 g/dL — ABNORMAL LOW (ref 12.0–15.0)
Lymphocytes Relative: 29.9 % (ref 12.0–46.0)
Lymphs Abs: 1.3 10*3/uL (ref 0.7–4.0)
MCHC: 32.2 g/dL (ref 30.0–36.0)
MCV: 94.8 fl (ref 78.0–100.0)
Monocytes Absolute: 0.3 10*3/uL (ref 0.1–1.0)
Monocytes Relative: 6.2 % (ref 3.0–12.0)
Neutro Abs: 2.5 10*3/uL (ref 1.4–7.7)
Neutrophils Relative %: 58.6 % (ref 43.0–77.0)
Platelets: 174 10*3/uL (ref 150.0–400.0)
RBC: 3.29 Mil/uL — ABNORMAL LOW (ref 3.87–5.11)
RDW: 17.1 % — ABNORMAL HIGH (ref 11.5–15.5)
WBC: 4.3 10*3/uL (ref 4.0–10.5)

## 2019-11-04 ENCOUNTER — Telehealth: Payer: Self-pay | Admitting: Family Medicine

## 2019-11-04 NOTE — Telephone Encounter (Signed)
Contacted pharmacy and talked to [place] who reports she is not sure if pt should continue Plavix. Advised it looks like pt's cardiologist sent in a RF on 4/20 to [facility]. Danna Hefty number to contact that pharmacy. Advised if any further questions concerning Plavix to contact pt's cardiologist. Colletta Maryland verbalized understanding.

## 2019-11-04 NOTE — Telephone Encounter (Signed)
Noted. Thanks.

## 2019-11-04 NOTE — Telephone Encounter (Signed)
Advised pt of pharmacy note. Pt verbalized understanding

## 2019-11-04 NOTE — Telephone Encounter (Signed)
Patient was put on generic Plavix at the hospital.  Patient's out of medication.  Patient wants to know if she should continue or discontinue the medication?

## 2019-11-05 ENCOUNTER — Telehealth: Payer: Self-pay

## 2019-11-05 NOTE — Telephone Encounter (Signed)
Pt left a message on Triage Line asking for her most result lab results. States she has not heard from anyone.

## 2019-11-05 NOTE — Telephone Encounter (Signed)
Spoke to patient advising that Dr. Damita Dunnings is out of the office this week and we will be in touch when he reviews the lab findings.

## 2019-11-09 DIAGNOSIS — J449 Chronic obstructive pulmonary disease, unspecified: Secondary | ICD-10-CM | POA: Diagnosis not present

## 2019-11-12 ENCOUNTER — Other Ambulatory Visit: Payer: Self-pay

## 2019-11-12 MED ORDER — BUSPIRONE HCL 5 MG PO TABS: 5.0000 mg | ORAL_TABLET | Freq: Two times a day (BID) | ORAL | 5 refills | Status: DC

## 2019-11-12 NOTE — Telephone Encounter (Signed)
Pt left /vm requesting refill buspirone. Pt request cb when filled. [facility]. Pt is out of med. Pt last seen [facility]U on 09/18/19 and pt said she is taking the cymbalta and buspirone.

## 2019-11-13 ENCOUNTER — Telehealth: Payer: Self-pay | Admitting: Family Medicine

## 2019-11-13 NOTE — Telephone Encounter (Signed)
[name] with [facility] called in regards to orders.  She stated they have faxed over orders for PT back in march several times and have no received anything back from Dr Damita Dunnings.   Have you seen orders for this patient?    [id number] C/B # [phone]

## 2019-11-15 NOTE — Telephone Encounter (Signed)
Was unavailable to sign hardcopy orders due to family emergency.  Will sign off on hardcopy form.  It will be in my out box.  Thanks.

## 2019-11-24 ENCOUNTER — Other Ambulatory Visit: Payer: Self-pay

## 2019-11-24 NOTE — Telephone Encounter (Signed)
[place] contacted the office stating that patient needs a refill on Alprazolam 1mg  tablets. Last refilled 09/29/19 for #90 with 0 refills.  Is this ok to refill? [place]

## 2019-11-25 MED ORDER — ALPRAZOLAM 1 MG PO TABS: ORAL_TABLET | ORAL | 0 refills | Status: DC

## 2019-11-25 NOTE — Telephone Encounter (Signed)
Sent. Please get update on patient about her current status.  Thanks.

## 2019-11-26 NOTE — Telephone Encounter (Signed)
Patient says she is doing ok.  She had her mammogram and that area is smaller.  She received a note to schedule an appointment with Cardiology and will be doing that.  She says she sleeps a lot because she is so tired.  She does not feel like going out and sometimes gets lonely but her daughter is there a fair portion of the time.

## 2019-11-27 MED ORDER — ALPRAZOLAM 1 MG PO TABS: ORAL_TABLET | ORAL | 0 refills | Status: DC

## 2019-11-27 NOTE — Telephone Encounter (Signed)
See other note re: refill request, rx sent.  Thanks.

## 2019-11-27 NOTE — Telephone Encounter (Signed)
Left detailed message on voicemail.  

## 2019-11-27 NOTE — Telephone Encounter (Signed)
Noted. Thanks.  My understanding on her xanax refill request (per Rena's report of conversation with patient) was that she had tried to taper down to BID dosing but had persistent anxiety and needed TID dosing.  rx adjusted but please give sedation caution on med.  Thanks.

## 2019-11-27 NOTE — Telephone Encounter (Signed)
Pt  calling about Xanax refill; pt said she does remember a long time ago talking to Dr Damita Dunnings about decreasing the frequency of alprazolam down to twice a day instead of three times a day. Pt said she cannot decrease her Xanax at this time and pt request a new rx for Xanax 1 mg taking 1/2 tab po tid prn for anxiety to [facility] in [place]. Pt said now Alvis Lemmings will not send her the xanax because of instruction change. Pt request cb today after Dr Damita Dunnings reviews.

## 2019-11-27 NOTE — Addendum Note (Signed)
Addended by: Tonia Ghent on: 11/27/2019 03:47 PM   Modules accepted: Orders

## 2019-11-29 NOTE — Telephone Encounter (Signed)
Thanks

## 2019-12-01 ENCOUNTER — Other Ambulatory Visit: Payer: Self-pay | Admitting: Family Medicine

## 2019-12-10 DIAGNOSIS — J449 Chronic obstructive pulmonary disease, unspecified: Secondary | ICD-10-CM | POA: Diagnosis not present

## 2019-12-14 ENCOUNTER — Other Ambulatory Visit: Payer: Self-pay | Admitting: Family Medicine

## 2019-12-17 ENCOUNTER — Other Ambulatory Visit: Payer: Self-pay | Admitting: Family Medicine

## 2019-12-22 ENCOUNTER — Other Ambulatory Visit: Payer: Self-pay | Admitting: *Deleted

## 2020-01-09 DIAGNOSIS — J449 Chronic obstructive pulmonary disease, unspecified: Secondary | ICD-10-CM | POA: Diagnosis not present

## 2020-01-19 ENCOUNTER — Other Ambulatory Visit: Payer: Self-pay | Admitting: Family Medicine

## 2020-01-19 NOTE — Telephone Encounter (Signed)
Refill request Xanax Last refill 11/27/19 #135 Last office visit 09/18/19

## 2020-01-20 ENCOUNTER — Other Ambulatory Visit: Payer: Self-pay

## 2020-01-20 NOTE — Telephone Encounter (Signed)
Sent. Thanks.  Please get update on patient, about her status in general.

## 2020-01-25 ENCOUNTER — Telehealth: Payer: Self-pay | Admitting: *Deleted

## 2020-01-25 NOTE — Telephone Encounter (Signed)
Santiago Glad with [employer] called stating that patient is in need of a portable oxygen concentrator. Santiago Glad stated that the patient already has oxygen but needs a portable unit. Santiago Glad stated that this needs authorization from her doctor. Santiago Glad is requesting that an order be sent to [facility] for this. Fax # [phone]

## 2020-01-26 NOTE — Telephone Encounter (Signed)
Order printed and faxed.

## 2020-01-26 NOTE — Telephone Encounter (Signed)
Please send the order for portable oxygen concentrator.  Thanks. Diagnosis Chronic respiratory failure with hypoxia (HCC) J96.11

## 2020-01-28 ENCOUNTER — Other Ambulatory Visit: Payer: Self-pay | Admitting: Family Medicine

## 2020-01-28 NOTE — Telephone Encounter (Signed)
[name] with [employer] ins said that pts oxygen supplier does not offer small portable oxygen concentrators.  [name] found [employer] which carries the portable oxygen concentrator and will supply to pt but [employer] needs order for concentrator,testing notes,and chart notes supporting needs for concentrator faxed to [phone]. If need to speak with [employer] call [phone]. pts [employer] member ID # is [id number] and [name] had spoken with Estill Bamberg at [facility] previously if need name of contact person at [facility]. FYI to [place].

## 2020-01-28 NOTE — Telephone Encounter (Signed)
Order faxed to [facility], ATT:  Estill Bamberg.  [phone]

## 2020-01-29 ENCOUNTER — Telehealth: Payer: Self-pay | Admitting: Family Medicine

## 2020-01-29 ENCOUNTER — Other Ambulatory Visit: Payer: Self-pay

## 2020-01-29 NOTE — Telephone Encounter (Signed)
[place] Night - Client Nonclinical Telephone Record AccessNurse Client [place] Night - Client Client Site [place] - Night Contact Type Call Who Is Calling Patient / Member / Family / Caregiver Caller Name [place] Phone Number [phone] Patient Name [name] Patient DOB 10/07/1930 Call Type Message Only Information Provided Reason for Call Request for General Office Information Initial Comment Caller stated she would like to speak to the physician. Caller stated she needs a prescription refill for fluticasone 15mg . Additional Comment Caller declined triage. Caller was provided the office hours. Disp. Time Disposition Final User 01/28/2020 5:41:39 PM General Information Provided Yes Ishimwe, Giselle Call Closed By: [place] Old Transaction Date/Time: 01/28/2020 5:38:49 PM (ET)

## 2020-01-29 NOTE — Telephone Encounter (Signed)
Per DPR left detailed v/m fluticasone refill was sent to [facility] on 01/28/20 and pt should ck with pharmacy and if needed call [place] back.

## 2020-01-29 NOTE — Telephone Encounter (Signed)
Gilda,[employer],called.[employer] received an order for portable oxygen concentrator. Patient will ,also, need a stationary oxygen concentrator.  Patient needs office visit with oxygen testing. Include results of oxygen testing in office note. Fax number [phone].

## 2020-01-29 NOTE — Telephone Encounter (Signed)
Pt called asking for a refill of Flonase. I advised her it was done yesterday to [facility]

## 2020-02-01 NOTE — Telephone Encounter (Signed)
Left detailed message on voicemail to schedule appointment.

## 2020-02-01 NOTE — Telephone Encounter (Signed)
Please.  Thanks.  59min OV.

## 2020-02-01 NOTE — Telephone Encounter (Signed)
Shall we get the patient scheduled for this data to be collected?

## 2020-02-02 ENCOUNTER — Telehealth: Payer: Self-pay | Admitting: Interventional Cardiology

## 2020-02-02 NOTE — Telephone Encounter (Signed)
Pt called to report that she has an appt with Dr. Tamala Julian tomorrow at 4 pm and she was asking if we can make it virtual... she says she does not feel well enough to make it out of the house if she does not have to.,.. she says that he "heart has been doing well" but she struggles with her COPD and she says it is not easy getting a ride here and with the heat she feels better staying at home and not getting out in the heat much.  I advised her that I will send to Dr. Thompson Caul nurse Anderson Malta for review when she is back in the office in the morning.   She is not on My Chart but has an Ipad... she can also have someone get her vitals prior to the appt if okay with Dr. Tamala Julian.   Pt will wait for a call back in the morning.

## 2020-02-02 NOTE — Telephone Encounter (Signed)
Patient wanted to know if she could do her appointment tomorrow over the phone instead of in person. She said her daughter can take all of her vitals. She just does not feel well, and she is not sure if she will have a ride. Please advise

## 2020-02-02 NOTE — Progress Notes (Signed)
Virtual Visit via Video Note   This visit type was conducted due to national recommendations for restrictions regarding the COVID-19 Pandemic (e.g. social distancing) in an effort to limit this patient's exposure and mitigate transmission in our community.  Due to her co-morbid illnesses, this patient is at least at moderate risk for complications without adequate follow up.  This format is felt to be most appropriate for this patient at this time.  All issues noted in this document were discussed and addressed.  A limited physical exam was performed with this format.  Please refer to the patient's chart for her consent to telehealth for [facility].       Date:  02/03/2020   ID:  [name], DOB 01-12-1931, MRN [id number] The patient was identified using 2 identifiers.  Patient Location: Home Provider Location: Office/Clinic  PCP:  Tonia Ghent, MD  Cardiologist:  Sinclair Grooms, MD  Electrophysiologist:  None   Evaluation Performed:  Follow-Up Visit  Chief Complaint: CAD for clinical follow-up  History of Present Illness:    [name] is a 86 y.o. female with a hx of  CAD with 50-60& LM managed medically dure to co-morbidities, chronic systolic heart failure, status post acute STEMI 01/29/19 with DES RCA, breast cancer treated with anastrozole and lumpectomy, O2 requiring COPD, hyperlipidemia, and essential hypertension.  No angina or cardiac complaints.  She has not had angina.  She has not needed nitroglycerin.  She is chronically short of breath.  The patient does not have symptoms concerning for COVID-19 infection (fever, chills, cough, or new shortness of breath).    Past Medical History:  Diagnosis Date  . Abdominal pain 04/05/2017  . Allergic rhinitis, cause unspecified   . Allergy, unspecified not elsewhere classified   . Anemia, unspecified 04/10/2008   Qualifier: Diagnosis of  By: Linna Darner MD, Gwyndolyn Saxon    . Anxiety   . Anxiety state  03/01/2008   Qualifier: Diagnosis of  By: Council Mechanic MD, Hilaria Ota   . Asthma   . Breast cancer ([place]) 08/2018   right breast  . Breast mass 08/28/2018  . Chronic kidney disease    frequency  . COMMON MIGRAINE 12/24/2006   Qualifier: Diagnosis of  By: Fuller Plan CMA (AAMA), Lugene    . COPD (chronic obstructive pulmonary disease) ([place])   . Degeneration of intervertebral disc, site unspecified   . Diverticulosis of colon (without mention of hemorrhage)   . DNR (do not resuscitate) 09/08/2019  . Dysphagia 02/18/2019  . Encounter for chronic pain management 07/26/2016   Indication for chronic opioid: chronic back pain Medication and dose:  Oxycodone 10mg  TID # pills per month: 90 Last UDS date: 04/15/15 Pain contract signed (Y/N):  yes Date narcotic database last reviewed (include red flags):  05/06/17  . Esophageal reflux   . Fibromyalgia 12/24/2006   Qualifier: Diagnosis of  By: Fuller Plan CMA (AAMA), Lugene    . Headache   . HTN (hypertension) 06/09/2013  . Hyperlipidemia with target LDL less than 70 12/24/2006   Qualifier: Diagnosis of  By: Fuller Plan CMA (AAMA), Lugene    . Hypokalemia 05/24/2009   Qualifier: Diagnosis of  By: Lacretia Nicks    . Hypothyroidism 12/24/2006   Qualifier: Diagnosis of  By: Fuller Plan CMA (AAMA), Lugene    . Insomnia 12/24/2006   she had trials of antihistamines and TCAs prev w/o effect.     . Insomnia, unspecified   . Lower GI bleeding 09/09/2019  . Myalgia and  myositis, unspecified   . Need for prophylactic hormone replacement therapy (postmenopausal)   . Osteoarthritis of right knee 08/27/2011   R knee pain, s/p arthroscopy 2012 per [name]   . Osteoporosis 06/23/2007   Qualifier: Diagnosis of  By: Council Mechanic MD, Hilaria Ota   . Osteoporosis, unspecified   . Other abnormal blood chemistry   . Other and unspecified hyperlipidemia   . Other chronic pain   . Presence of drug coated stent in right coronary artery 01/30/2019  . Stroke [facility])    mini strokes  . Syncope  01/24/2017  . Thoracic aortic aneurysm ([place]) 04/10/2017  . Unspecified essential hypertension   . Unspecified hypothyroidism   . Vitamin D deficiency 09/26/2016   Past Surgical History:  Procedure Laterality Date  . BACK SURGERY    . breast cystectomy    . CORONARY/GRAFT ACUTE MI REVASCULARIZATION N/A 01/29/2019   Procedure: CORONARY/GRAFT ACUTE MI REVASCULARIZATION;  Surgeon: Belva Crome, MD;  Location: [place] [facility];  Service: Cardiovascular;  Laterality: N/A;  . JOINT REPLACEMENT     shoulder right  . KNEE ARTHROSCOPY     Right knee 2012  . MASS EXCISION Left 04/07/2015   Procedure: MINOR EXCISION OF MASS LEFT SMALL FINGER;  Surgeon: Leanora Cover, MD;  Location: [place];  Service: Orthopedics;  Laterality: Left;  . SHOULDER SURGERY    . TOTAL KNEE ARTHROPLASTY  12/11/2011   Procedure: TOTAL KNEE ARTHROPLASTY;  Surgeon: Johnny Bridge, MD;  Location: [place];  Service: Orthopedics;  Laterality: Right;  . UMBILICAL HERNIA REPAIR N/A 06/09/2013   Procedure: EXPLORATIORY LAPAROTOMY, HERNIA REPAIR UMBILICAL, INSERTION OF MESH;  Surgeon: Haywood Lasso, MD;  Location: [place];  Service: General;  Laterality: N/A;     Current Meds  Medication Sig  . albuterol (PROAIR HFA) 108 (90 Base) MCG/ACT inhaler Inhale 1-2 puffs into the lungs every 6 (six) hours as needed for wheezing or shortness of breath (Okay to fill with Ventolin or albuterol HFA).  Marland Kitchen alendronate (FOSAMAX) 70 MG tablet Take 1 tablet (70 mg total) by mouth once a week. Take with a full glass of water on an empty stomach.  . ALPRAZolam (XANAX) 1 MG tablet NEW PRESCRIPTION REQUEST: TABLET ONE-HALF TABLET BY MOUTH 2-3 TIMES DAILY AS NEEDED FOR ANXIETY  . anastrozole (ARIMIDEX) 1 MG tablet Take 1 tablet (1 mg total) by mouth daily.  . busPIRone (BUSPAR) 5 MG tablet TAKE ONE TABLET BY MOUTH IN THE MORNING AND IN THE EVENING  . Calcium Carbonate (CALTRATE 600 PO) Take 600 mg by mouth daily.   . cholecalciferol  (VITAMIN D) 1000 units tablet Take 1 tablet (1,000 Units total) by mouth daily.  . clopidogrel (PLAVIX) 75 MG tablet Take 75 mg by mouth daily.  . diclofenac sodium (VOLTAREN) 1 % GEL APPLY 4 GRAMS TO AFFECTED AREA 2 TIMES A DAY.  . DULoxetine (CYMBALTA) 60 MG capsule NEW PRESCRIPTION REQUEST: TAKE ONE CAPSULE BY MOUTH DAILY  . ferrous sulfate (FERROUSUL) 325 (65 FE) MG tablet Take 1 tablet (325 mg total) by mouth daily with breakfast.  . fluticasone (FLONASE) 50 MCG/ACT nasal spray PLACE 2 SPRAYS INTO BOTH NOSTRILS DAILY.  . hydroxypropyl methylcellulose / hypromellose (ISOPTO TEARS / GONIOVISC) 2.5 % ophthalmic solution Place 1 drop into both eyes 3 (three) times daily as needed for dry eyes.  Marland Kitchen levothyroxine (SYNTHROID) 112 MCG tablet NEW PRESCRIPTION REQUEST: TAKE ONE TABLET BY MOUTH DAILY  . Multiple Vitamin (MULTIVITAMIN WITH MINERALS) TABS tablet Take  1 tablet by mouth daily.  . Multiple Vitamins-Minerals (OCUVITE PRESERVISION PO) Take 1 tablet by mouth daily.  . nitroGLYCERIN (NITROSTAT) 0.4 MG SL tablet Place 1 tablet (0.4 mg total) under the tongue every 5 (five) minutes as needed for chest pain.  . OXYGEN Inhale 3-3.5 L into the lungs continuous.  . pantoprazole (PROTONIX) 20 MG tablet Take 1 tablet (20 mg total) by mouth 2 (two) times daily.  . rosuvastatin (CRESTOR) 10 MG tablet Take 1 tablet (10 mg total) by mouth daily.  Marland Kitchen tiotropium (SPIRIVA HANDIHALER) 18 MCG inhalation capsule PLACE 1 CAPSULE INTO INHALER AND INHALE ONCE A DAY  . traZODone (DESYREL) 50 MG tablet NEW PRESCRIPTION REQUEST: TAKE THREE TABLETS BY MOUTH DAILY AS NEEDED     Allergies:   Doxycycline, Sulfadiazine, and Sulfa antibiotics   Social History   Tobacco Use  . Smoking status: Former Smoker    Packs/day: 1.50    Years: 10.00    Pack years: 15.00    Types: Cigarettes    Quit date: 07/09/1985    Years since quitting: 34.5  . Smokeless tobacco: Never Used  . Tobacco comment: 1 1/2 ppd x 20 years    Substance Use Topics  . Alcohol use: No    Alcohol/week: 0.0 standard drinks  . Drug use: No     Family Hx: The patient's family history includes Asthma in her sister; Heart disease in her father and mother; Hypertension in her father; Lung cancer in her brother.  ROS:   Please see the history of present illness.    She has cough.  Otherwise no complaints.  No chills or fever. All other systems reviewed and are negative.   Prior CV studies:   The following studies were reviewed today:  None  Labs/Other Tests and Data Reviewed:    EKG:  No ECG reviewed.  Recent Labs: 03/27/2019: Pro B Natriuretic peptide (BNP) 461.0 09/07/2019: ALT 10; TSH 3.577 09/14/2019: BUN 13; Creatinine, Ser 0.56; Potassium 4.1; Sodium 141 11/02/2019: Hemoglobin 10.0; Platelets 174.0   Recent Lipid Panel Lab Results  Component Value Date/Time   CHOL 124 03/19/2019 11:05 AM   TRIG 72 03/19/2019 11:05 AM   HDL 47 03/19/2019 11:05 AM   CHOLHDL 2.6 03/19/2019 11:05 AM   CHOLHDL 4.7 01/29/2019 12:52 PM   LDLCALC 62 03/19/2019 11:05 AM   LDLDIRECT 157.9 03/12/2007 11:03 AM    Wt Readings from Last 3 Encounters:  02/03/20 130 lb (59 kg)  09/18/19 137 lb (62.1 kg)  09/07/19 137 lb (62.1 kg)     Objective:    Vital Signs:  BP (!) 146/72   Pulse 74   Ht 5\' 2"  (1.575 m)   Wt 130 lb (59 kg)   SpO2 95%   BMI 23.78 kg/m    VITAL SIGNS:  reviewed GEN:  no acute distress RESPIRATORY:  normal respiratory effort, symmetric expansion CARDIOVASCULAR:  no peripheral edema  ASSESSMENT & PLAN:    1. Coronary artery disease involving native coronary artery of native heart without angina pectoris   2. Essential hypertension   3. Hyperlipidemia, unspecified hyperlipidemia type   4. Thoracic aortic aneurysm without rupture ([place])   5. Gold C Copd with asthmatic bronchitis component and chronic resp failure   6. Educated about COVID-19 virus infection     RECOMMENDATION:  1. Stable without cardiac  symptoms to suggest angina.  Secondary prevention reviewed. 2. Blood pressure control is excellent for age.  Cozaar has been discontinued because of low blood  pressures.  Continue low-salt diet. 3. Continue Crestor 10 mg/day.  Most recent LDL cholesterol was less than 70. 4. Not evaluated on today's exam. 5. Not evaluated.  On chronic O2. 6. She has been vaccinated with Covid 19 mRNA vaccine.   COVID-19 Education: The signs and symptoms of COVID-19 were discussed with the patient and how to seek care for testing (follow up with PCP or arrange E-visit).  The importance of social distancing was discussed today.  Time:   Today, I have spent 10 minutes with the patient with telehealth technology discussing the above problems.     Medication Adjustments/Labs and Tests Ordered: Current medicines are reviewed at length with the patient today.  Concerns regarding medicines are outlined above.   Tests Ordered: No orders of the defined types were placed in this encounter.   Medication Changes: No orders of the defined types were placed in this encounter.   Follow Up:  In Person 12 months.  Signed, Sinclair Grooms, MD  02/03/2020 4:37 PM    [place]

## 2020-02-02 NOTE — Telephone Encounter (Signed)
Katie nurse with [facility] was doing a conference call with pt and pt had some additional questions about the portable oxygen concentrator. Pt wanted to know how much the unit weighed and Katie said the portable oxygen concentrator weighed 5 lbs. Pt also wants to know if come with 2 batteries. I advised pt I did not know and offered pt the [employer] contact # of [phone]; pt said she already has that # and will give [employer] a call to find out if has 2 batteries. Pt does not have other questions at this time but pt will cb if needed.

## 2020-02-03 ENCOUNTER — Telehealth (INDEPENDENT_AMBULATORY_CARE_PROVIDER_SITE_OTHER): Payer: Medicare PPO | Admitting: Interventional Cardiology

## 2020-02-03 ENCOUNTER — Other Ambulatory Visit: Payer: Self-pay

## 2020-02-03 ENCOUNTER — Telehealth: Payer: Self-pay

## 2020-02-03 ENCOUNTER — Encounter: Payer: Self-pay | Admitting: Interventional Cardiology

## 2020-02-03 VITALS — BP 146/72 | HR 74 | Ht 62.0 in | Wt 130.0 lb

## 2020-02-03 DIAGNOSIS — I10 Essential (primary) hypertension: Secondary | ICD-10-CM

## 2020-02-03 DIAGNOSIS — I2510 Atherosclerotic heart disease of native coronary artery without angina pectoris: Secondary | ICD-10-CM

## 2020-02-03 DIAGNOSIS — J449 Chronic obstructive pulmonary disease, unspecified: Secondary | ICD-10-CM | POA: Diagnosis not present

## 2020-02-03 DIAGNOSIS — E785 Hyperlipidemia, unspecified: Secondary | ICD-10-CM

## 2020-02-03 DIAGNOSIS — I712 Thoracic aortic aneurysm, without rupture, unspecified: Secondary | ICD-10-CM

## 2020-02-03 DIAGNOSIS — Z7189 Other specified counseling: Secondary | ICD-10-CM

## 2020-02-03 NOTE — Telephone Encounter (Signed)
Anderson Malta with [facility] called to see if patient could have [facility] labs drawn at [facility] for convenience for pt. Pt needs BMP,lipid and liver lab test done; per most recent guideline I advised Anderson Malta she could fax over a formal prescription with names and diagnosis codes for labs needed and we will send phone note to Dr Damita Dunnings to see if he will place order. If Dr Damita Dunnings will place order [facility] request call to pt to notify her and to schedule lab appt. If Dr Damita Dunnings does not agree to order lab testing call Anderson Malta at [facility] and let her know that.

## 2020-02-03 NOTE — Telephone Encounter (Signed)
I put in the orders.  Thanks.  ?

## 2020-02-03 NOTE — Telephone Encounter (Signed)
Spoke with pt this morning and advised that we could switch her to a VV.  She provided me with her email address, [email or web address], to send the link to.  Advised we would contact her closer to time for her appt and to have someone check her BP, HR and weight.  Pt in agreement with plan.

## 2020-02-03 NOTE — Patient Instructions (Signed)
Medication Instructions:  Your physician recommends that you continue on your current medications as directed. Please refer to the Current Medication list given to you today.  *If you need a refill on your cardiac medications before your next appointment, please call your pharmacy*   Lab Work: BMET, lipid and liver before October.  If you have labs (blood work) drawn today and your tests are completely normal, you will receive your results only by: Marland Kitchen MyChart Message (if you have MyChart) OR . A paper copy in the mail If you have any lab test that is abnormal or we need to change your treatment, we will call you to review the results.   Testing/Procedures: None   Follow-Up: At [facility], you and your health needs are our priority.  As part of our continuing mission to provide you with exceptional heart care, we have created designated Provider Care Teams.  These Care Teams include your primary Cardiologist (physician) and Advanced Practice Providers (APPs -  Physician Assistants and Nurse Practitioners) who all work together to provide you with the care you need, when you need it.   Your next appointment:   9-12 month(s)  The format for your next appointment:   In Person  Provider:   You may see Sinclair Grooms, MD or one of the following Advanced Practice Providers on your designated Care Team:    Truitt Merle, NP  Cecilie Kicks, NP  Kathyrn Drown, NP

## 2020-02-04 NOTE — Telephone Encounter (Signed)
Scheduled pt for lab apt.

## 2020-02-09 ENCOUNTER — Other Ambulatory Visit: Payer: Medicare PPO

## 2020-02-09 DIAGNOSIS — J449 Chronic obstructive pulmonary disease, unspecified: Secondary | ICD-10-CM | POA: Diagnosis not present

## 2020-02-11 NOTE — Telephone Encounter (Signed)
Pt said that she has been seeing poor ratings for [employer] and pt is afraid to use [employer] with only 2 stars out of 5 star rating.pt appreciates all our hardwork but with such a low rating pt is afraid to use [employer] for anything. Pt will talk with her ins co about who else she could possibly gets oxygen portable concentrator from that ins would approve and pt will cb with that info on 02/15/20. FYI to Dr Damita Dunnings and Terri Skains CMA.

## 2020-02-12 ENCOUNTER — Telehealth: Payer: Self-pay

## 2020-02-12 NOTE — Telephone Encounter (Signed)
Pt is out of fluticasone and pt said she is using as instructed. I called [facility] and Colletta Maryland said fluticasone was filled on 01/22/20 and the earliest ins will allow pick up is 02/14/20 and pt can pick up on 02/15/20. I let pt know and she wanted to know if we could do anything so pt could get med sooner than Monday. I advised pt can call [facility] and talk with them and if cannot get thru with ins approval pt could see how expensive out of pocket would be; pt appreciative and voiced understanding; nothing further needed at this time.

## 2020-02-12 NOTE — Telephone Encounter (Signed)
Noted. Thanks.

## 2020-02-18 ENCOUNTER — Other Ambulatory Visit: Payer: Self-pay | Admitting: Family Medicine

## 2020-02-18 NOTE — Telephone Encounter (Signed)
Rachel Moulds from [facility] said requested x 3 refill pantoprazole. Advised see refill request from today and our office request 48-72 hours to refill meds.  I was going to give verbal OK while on phone with Alvis Lemmings and Rachel Moulds request pantoprazole refill be sent back electronically. Pt last HFU 09/18/19. See cardiology phone note on 09/25/19. I refilled pantoprazole 20 mg # 180 x 1 electronically as requested.

## 2020-02-19 NOTE — Addendum Note (Signed)
Addended by: Tonia Ghent on: 02/19/2020 06:48 AM   Modules accepted: Orders

## 2020-02-19 NOTE — Telephone Encounter (Signed)
Sent. Thanks.  Please get update on patient's condition.

## 2020-02-23 ENCOUNTER — Other Ambulatory Visit: Payer: Self-pay

## 2020-02-23 ENCOUNTER — Telehealth: Payer: Self-pay | Admitting: *Deleted

## 2020-02-23 MED ORDER — NITROGLYCERIN 0.4 MG SL SUBL: 0.4000 mg | SUBLINGUAL_TABLET | SUBLINGUAL | 6 refills | Status: DC | PRN

## 2020-02-23 NOTE — Telephone Encounter (Signed)
PA submitted thru CMM for Spiriva, awaiting response.

## 2020-03-11 DIAGNOSIS — J449 Chronic obstructive pulmonary disease, unspecified: Secondary | ICD-10-CM | POA: Diagnosis not present

## 2020-03-18 ENCOUNTER — Other Ambulatory Visit: Payer: Self-pay | Admitting: *Deleted

## 2020-03-18 MED ORDER — BUSPIRONE HCL 5 MG PO TABS: ORAL_TABLET | ORAL | 2 refills | Status: DC

## 2020-03-18 NOTE — Telephone Encounter (Signed)
Faxed refill request. Alprazolam Last office visit:   09/18/2019 Last Filled:    135 tablet 0 01/20/2020

## 2020-03-20 MED ORDER — ALPRAZOLAM 1 MG PO TABS: ORAL_TABLET | ORAL | 0 refills | Status: DC

## 2020-03-20 NOTE — Telephone Encounter (Signed)
Sent. Thanks.   

## 2020-03-24 ENCOUNTER — Other Ambulatory Visit: Payer: Self-pay | Admitting: Family Medicine

## 2020-03-24 NOTE — Telephone Encounter (Signed)
Electronic refill request. Trazodone Last office visit:   09/18/2019 Last Filled:    270 tablet 1 09/29/2019

## 2020-03-25 NOTE — Telephone Encounter (Signed)
Sent. Thanks.   

## 2020-04-10 DIAGNOSIS — J449 Chronic obstructive pulmonary disease, unspecified: Secondary | ICD-10-CM | POA: Diagnosis not present

## 2020-04-11 ENCOUNTER — Telehealth: Payer: Self-pay

## 2020-04-11 NOTE — Telephone Encounter (Signed)
Legrand Como said he needs a copy of oxygen prescription faxed to [phone]. He said patient is trying to get oxygen concentrator to put on her shoulder instead of the oxygen machine.

## 2020-04-11 NOTE — Telephone Encounter (Signed)
Please send order for O2.   Oxygen 3liters continuous  SATURATION QUALIFICATIONS: Patient Saturations on Room Air at Rest = 88%----11/16/11  SATURATION QUALIFICATIONS: Patient Saturations on Room Air at Rest:  88% - 02/26/2017 Patient Saturations on Room Air while ambulating:  87% Patient Saturations on 3 Liters of oxygen while ambulating:  94%  Thanks.

## 2020-04-12 ENCOUNTER — Encounter: Payer: Self-pay | Admitting: Family Medicine

## 2020-04-12 NOTE — Telephone Encounter (Signed)
Order printed, signed and faxed

## 2020-04-18 ENCOUNTER — Telehealth: Payer: Self-pay

## 2020-04-18 MED ORDER — CLOPIDOGREL BISULFATE 75 MG PO TABS: 75.0000 mg | ORAL_TABLET | Freq: Every day | ORAL | 2 refills | Status: DC

## 2020-04-18 NOTE — Telephone Encounter (Signed)
Pt pharmacy requested refill 90 day supply of CLOPIDOGREL 75 mg po qd. [place] [phone].

## 2020-05-11 DIAGNOSIS — J449 Chronic obstructive pulmonary disease, unspecified: Secondary | ICD-10-CM | POA: Diagnosis not present

## 2020-05-23 ENCOUNTER — Other Ambulatory Visit: Payer: Self-pay | Admitting: Family Medicine

## 2020-05-23 NOTE — Telephone Encounter (Signed)
Pharmacy requests refill on: Rosuvastatin 10 mg  LAST REFILL: 04/21/2020 LAST OV: 09/18/2019 NEXT OV: Not Scheduled PHARMACY: [place]

## 2020-06-10 DIAGNOSIS — J449 Chronic obstructive pulmonary disease, unspecified: Secondary | ICD-10-CM | POA: Diagnosis not present

## 2020-06-13 ENCOUNTER — Other Ambulatory Visit: Payer: Self-pay | Admitting: Family Medicine

## 2020-06-13 NOTE — Telephone Encounter (Signed)
Sent. Thanks.   

## 2020-06-13 NOTE — Telephone Encounter (Signed)
Pharmacy requests refill on: Alprazolam 1 mg   LAST REFILL: 03/20/2020 (Q-135, R-0) LAST OV: 09/18/2019 NEXT OV: Not Scheduled  PHARMACY: [place] requests refill on: Buspirone 5 mg   LAST REFILL: 03/18/2020 LAST OV: 09/18/2019 NEXT OV: Not Scheduled  PHARMACY: [place]

## 2020-07-07 ENCOUNTER — Inpatient Hospital Stay (HOSPITAL_COMMUNITY)
Admission: EM | Admit: 2020-07-07 | Discharge: 2020-07-11 | DRG: 190 | Disposition: A | Discharge status: Home Health | Payer: Medicare PPO | Attending: Internal Medicine | Admitting: Internal Medicine

## 2020-07-07 ENCOUNTER — Other Ambulatory Visit: Payer: Self-pay

## 2020-07-07 ENCOUNTER — Emergency Department (HOSPITAL_COMMUNITY): Payer: Medicare PPO

## 2020-07-07 ENCOUNTER — Encounter (HOSPITAL_COMMUNITY): Payer: Self-pay | Admitting: *Deleted

## 2020-07-07 DIAGNOSIS — R404 Transient alteration of awareness: Secondary | ICD-10-CM | POA: Diagnosis not present

## 2020-07-07 DIAGNOSIS — G319 Degenerative disease of nervous system, unspecified: Secondary | ICD-10-CM | POA: Diagnosis not present

## 2020-07-07 DIAGNOSIS — I714 Abdominal aortic aneurysm, without rupture: Secondary | ICD-10-CM | POA: Diagnosis present

## 2020-07-07 DIAGNOSIS — D638 Anemia in other chronic diseases classified elsewhere: Secondary | ICD-10-CM | POA: Diagnosis present

## 2020-07-07 DIAGNOSIS — S199XXA Unspecified injury of neck, initial encounter: Secondary | ICD-10-CM | POA: Diagnosis not present

## 2020-07-07 DIAGNOSIS — J449 Chronic obstructive pulmonary disease, unspecified: Secondary | ICD-10-CM | POA: Diagnosis present

## 2020-07-07 DIAGNOSIS — I252 Old myocardial infarction: Secondary | ICD-10-CM

## 2020-07-07 DIAGNOSIS — Z8673 Personal history of transient ischemic attack (TIA), and cerebral infarction without residual deficits: Secondary | ICD-10-CM

## 2020-07-07 DIAGNOSIS — M47812 Spondylosis without myelopathy or radiculopathy, cervical region: Secondary | ICD-10-CM | POA: Diagnosis not present

## 2020-07-07 DIAGNOSIS — E785 Hyperlipidemia, unspecified: Secondary | ICD-10-CM | POA: Diagnosis present

## 2020-07-07 DIAGNOSIS — I248 Other forms of acute ischemic heart disease: Secondary | ICD-10-CM | POA: Diagnosis present

## 2020-07-07 DIAGNOSIS — M79601 Pain in right arm: Secondary | ICD-10-CM | POA: Diagnosis present

## 2020-07-07 DIAGNOSIS — F32A Depression, unspecified: Secondary | ICD-10-CM | POA: Diagnosis present

## 2020-07-07 DIAGNOSIS — Z955 Presence of coronary angioplasty implant and graft: Secondary | ICD-10-CM

## 2020-07-07 DIAGNOSIS — U071 COVID-19: Secondary | ICD-10-CM | POA: Diagnosis present

## 2020-07-07 DIAGNOSIS — R4182 Altered mental status, unspecified: Secondary | ICD-10-CM | POA: Diagnosis not present

## 2020-07-07 DIAGNOSIS — J441 Chronic obstructive pulmonary disease with (acute) exacerbation: Secondary | ICD-10-CM | POA: Diagnosis present

## 2020-07-07 DIAGNOSIS — W1830XA Fall on same level, unspecified, initial encounter: Secondary | ICD-10-CM | POA: Diagnosis present

## 2020-07-07 DIAGNOSIS — G894 Chronic pain syndrome: Secondary | ICD-10-CM | POA: Diagnosis present

## 2020-07-07 DIAGNOSIS — Z96611 Presence of right artificial shoulder joint: Secondary | ICD-10-CM | POA: Diagnosis present

## 2020-07-07 DIAGNOSIS — D696 Thrombocytopenia, unspecified: Secondary | ICD-10-CM | POA: Diagnosis present

## 2020-07-07 DIAGNOSIS — I10 Essential (primary) hypertension: Secondary | ICD-10-CM | POA: Diagnosis present

## 2020-07-07 DIAGNOSIS — J9621 Acute and chronic respiratory failure with hypoxia: Secondary | ICD-10-CM | POA: Diagnosis present

## 2020-07-07 DIAGNOSIS — Z79891 Long term (current) use of opiate analgesic: Secondary | ICD-10-CM

## 2020-07-07 DIAGNOSIS — I2510 Atherosclerotic heart disease of native coronary artery without angina pectoris: Secondary | ICD-10-CM | POA: Diagnosis present

## 2020-07-07 DIAGNOSIS — Z79899 Other long term (current) drug therapy: Secondary | ICD-10-CM

## 2020-07-07 DIAGNOSIS — E039 Hypothyroidism, unspecified: Secondary | ICD-10-CM | POA: Diagnosis present

## 2020-07-07 DIAGNOSIS — W19XXXA Unspecified fall, initial encounter: Secondary | ICD-10-CM

## 2020-07-07 DIAGNOSIS — Y92009 Unspecified place in unspecified non-institutional (private) residence as the place of occurrence of the external cause: Secondary | ICD-10-CM

## 2020-07-07 DIAGNOSIS — M810 Age-related osteoporosis without current pathological fracture: Secondary | ICD-10-CM | POA: Diagnosis present

## 2020-07-07 DIAGNOSIS — R059 Cough, unspecified: Secondary | ICD-10-CM | POA: Diagnosis not present

## 2020-07-07 DIAGNOSIS — I255 Ischemic cardiomyopathy: Secondary | ICD-10-CM | POA: Diagnosis present

## 2020-07-07 DIAGNOSIS — I25110 Atherosclerotic heart disease of native coronary artery with unstable angina pectoris: Secondary | ICD-10-CM | POA: Diagnosis present

## 2020-07-07 DIAGNOSIS — Z7983 Long term (current) use of bisphosphonates: Secondary | ICD-10-CM

## 2020-07-07 DIAGNOSIS — F411 Generalized anxiety disorder: Secondary | ICD-10-CM | POA: Diagnosis present

## 2020-07-07 DIAGNOSIS — Z882 Allergy status to sulfonamides status: Secondary | ICD-10-CM

## 2020-07-07 DIAGNOSIS — I712 Thoracic aortic aneurysm, without rupture: Secondary | ICD-10-CM | POA: Diagnosis present

## 2020-07-07 DIAGNOSIS — Z87891 Personal history of nicotine dependence: Secondary | ICD-10-CM

## 2020-07-07 DIAGNOSIS — K219 Gastro-esophageal reflux disease without esophagitis: Secondary | ICD-10-CM | POA: Diagnosis present

## 2020-07-07 DIAGNOSIS — H9190 Unspecified hearing loss, unspecified ear: Secondary | ICD-10-CM | POA: Diagnosis present

## 2020-07-07 DIAGNOSIS — J309 Allergic rhinitis, unspecified: Secondary | ICD-10-CM | POA: Diagnosis present

## 2020-07-07 DIAGNOSIS — I6782 Cerebral ischemia: Secondary | ICD-10-CM | POA: Diagnosis not present

## 2020-07-07 DIAGNOSIS — Z8249 Family history of ischemic heart disease and other diseases of the circulatory system: Secondary | ICD-10-CM

## 2020-07-07 DIAGNOSIS — R531 Weakness: Secondary | ICD-10-CM

## 2020-07-07 DIAGNOSIS — M797 Fibromyalgia: Secondary | ICD-10-CM | POA: Diagnosis present

## 2020-07-07 DIAGNOSIS — E782 Mixed hyperlipidemia: Secondary | ICD-10-CM | POA: Diagnosis present

## 2020-07-07 DIAGNOSIS — R55 Syncope and collapse: Secondary | ICD-10-CM | POA: Diagnosis not present

## 2020-07-07 DIAGNOSIS — M1711 Unilateral primary osteoarthritis, right knee: Secondary | ICD-10-CM | POA: Diagnosis present

## 2020-07-07 DIAGNOSIS — Z79890 Hormone replacement therapy (postmenopausal): Secondary | ICD-10-CM

## 2020-07-07 DIAGNOSIS — C50911 Malignant neoplasm of unspecified site of right female breast: Secondary | ICD-10-CM | POA: Diagnosis present

## 2020-07-07 DIAGNOSIS — R6889 Other general symptoms and signs: Secondary | ICD-10-CM | POA: Diagnosis not present

## 2020-07-07 DIAGNOSIS — Z7902 Long term (current) use of antithrombotics/antiplatelets: Secondary | ICD-10-CM

## 2020-07-07 DIAGNOSIS — Z743 Need for continuous supervision: Secondary | ICD-10-CM | POA: Diagnosis not present

## 2020-07-07 DIAGNOSIS — Z9981 Dependence on supplemental oxygen: Secondary | ICD-10-CM

## 2020-07-07 DIAGNOSIS — I6523 Occlusion and stenosis of bilateral carotid arteries: Secondary | ICD-10-CM | POA: Diagnosis not present

## 2020-07-07 DIAGNOSIS — R4020 Unspecified coma: Secondary | ICD-10-CM | POA: Diagnosis not present

## 2020-07-07 DIAGNOSIS — Z66 Do not resuscitate: Secondary | ICD-10-CM | POA: Diagnosis present

## 2020-07-07 DIAGNOSIS — Z881 Allergy status to other antibiotic agents status: Secondary | ICD-10-CM

## 2020-07-07 DIAGNOSIS — Z96651 Presence of right artificial knee joint: Secondary | ICD-10-CM | POA: Diagnosis present

## 2020-07-07 LAB — COMPREHENSIVE METABOLIC PANEL WITH GFR
ALT: 15 U/L (ref 0–44)
AST: 40 U/L (ref 15–41)
Albumin: 3.7 g/dL (ref 3.5–5.0)
Alkaline Phosphatase: 38 U/L (ref 38–126)
Anion gap: 9 (ref 5–15)
BUN: 14 mg/dL (ref 8–23)
CO2: 32 mmol/L (ref 22–32)
Calcium: 8.8 mg/dL — ABNORMAL LOW (ref 8.9–10.3)
Chloride: 96 mmol/L — ABNORMAL LOW (ref 98–111)
Creatinine, Ser: 0.62 mg/dL (ref 0.44–1.00)
GFR, Estimated: >60 mL/min (ref >60)
Glucose, Bld: 108 mg/dL — ABNORMAL HIGH (ref 70–99)
Potassium: 3.7 mmol/L (ref 3.5–5.1)
Sodium: 137 mmol/L (ref 135–145)
Total Bilirubin: 0.5 mg/dL (ref 0.3–1.2)
Total Protein: 7.1 g/dL (ref 6.5–8.1)

## 2020-07-07 LAB — PROCALCITONIN: Procalcitonin: <0.1 ng/mL

## 2020-07-07 LAB — CBC WITH DIFFERENTIAL/PLATELET
Abs Immature Granulocytes: 0.01 10*3/uL (ref 0.00–0.07)
Basophils Absolute: 0 10*3/uL (ref 0.0–0.1)
Basophils Relative: 1 %
Eosinophils Absolute: 0 10*3/uL (ref 0.0–0.5)
Eosinophils Relative: 0 %
HCT: 33.9 % — ABNORMAL LOW (ref 36.0–46.0)
Hemoglobin: 10.7 g/dL — ABNORMAL LOW (ref 12.0–15.0)
Immature Granulocytes: 0 %
Lymphocytes Relative: 8 %
Lymphs Abs: 0.4 10*3/uL — ABNORMAL LOW (ref 0.7–4.0)
MCH: 32.7 pg (ref 26.0–34.0)
MCHC: 31.6 g/dL (ref 30.0–36.0)
MCV: 103.7 fL — ABNORMAL HIGH (ref 80.0–100.0)
Monocytes Absolute: 0.4 10*3/uL (ref 0.1–1.0)
Monocytes Relative: 8 %
Neutro Abs: 3.6 10*3/uL (ref 1.7–7.7)
Neutrophils Relative %: 83 %
Platelets: 128 10*3/uL — ABNORMAL LOW (ref 150–400)
RBC: 3.27 MIL/uL — ABNORMAL LOW (ref 3.87–5.11)
RDW: 13.4 % (ref 11.5–15.5)
WBC: 4.3 10*3/uL (ref 4.0–10.5)
nRBC: 0 % (ref 0.0–0.2)

## 2020-07-07 LAB — LACTIC ACID, PLASMA: Lactic Acid, Venous: 1.3 mmol/L (ref 0.5–1.9)

## 2020-07-07 LAB — LACTATE DEHYDROGENASE: LDH: 148 U/L (ref 98–192)

## 2020-07-07 LAB — FERRITIN: Ferritin: 34 ng/mL (ref 11–307)

## 2020-07-07 LAB — RESP PANEL BY RT-PCR (FLU A&B, COVID) ARPGX2
Influenza A by PCR: NEGATIVE
Influenza B by PCR: NEGATIVE
SARS Coronavirus 2 by RT PCR: POSITIVE — AB

## 2020-07-07 LAB — BRAIN NATRIURETIC PEPTIDE: B Natriuretic Peptide: 167.8 pg/mL — ABNORMAL HIGH (ref 0.0–100.0)

## 2020-07-07 LAB — D-DIMER, QUANTITATIVE: D-Dimer, Quant: 0.94 ug{FEU}/mL — ABNORMAL HIGH (ref 0.00–0.50)

## 2020-07-07 LAB — C-REACTIVE PROTEIN: CRP: 0.6 mg/dL (ref <1.0)

## 2020-07-07 LAB — CK: Total CK: 260 U/L — ABNORMAL HIGH (ref 38–234)

## 2020-07-07 LAB — FIBRINOGEN: Fibrinogen: 364 mg/dL (ref 210–475)

## 2020-07-07 LAB — TRIGLYCERIDES: Triglycerides: 37 mg/dL (ref <150)

## 2020-07-07 LAB — TROPONIN I (HIGH SENSITIVITY)
Troponin I (High Sensitivity): 42 ng/L — ABNORMAL HIGH (ref <18)
Troponin I (High Sensitivity): 39 ng/L — ABNORMAL HIGH (ref <18)

## 2020-07-07 MED ORDER — ZINC SULFATE 220 (50 ZN) MG PO CAPS
220.0000 mg | ORAL_CAPSULE | Freq: Every day | ORAL | Status: DC
  Administered 2020-07-08 – 2020-07-11 (×4): 220 mg via ORAL
  Filled 2020-07-07 (×4): qty 1

## 2020-07-07 MED ORDER — BUSPIRONE HCL 5 MG PO TABS
5.0000 mg | ORAL_TABLET | Freq: Two times a day (BID) | ORAL | Status: DC
  Administered 2020-07-08 – 2020-07-11 (×8): 5 mg via ORAL
  Filled 2020-07-07 (×8): qty 1

## 2020-07-07 MED ORDER — UMECLIDINIUM BROMIDE 62.5 MCG/INH IN AEPB
1.0000 | INHALATION_SPRAY | Freq: Every day | RESPIRATORY_TRACT | Status: DC
  Administered 2020-07-08 – 2020-07-11 (×4): 1 via RESPIRATORY_TRACT
  Filled 2020-07-07: qty 7

## 2020-07-07 MED ORDER — ANASTROZOLE 1 MG PO TABS
1.0000 mg | ORAL_TABLET | Freq: Every day | ORAL | Status: DC
  Administered 2020-07-08 – 2020-07-11 (×4): 1 mg via ORAL
  Filled 2020-07-07 (×4): qty 1

## 2020-07-07 MED ORDER — SODIUM CHLORIDE 0.9 % IV SOLN
1000.0000 mL | INTRAVENOUS | Status: DC
  Administered 2020-07-07 (×2): 1000 mL via INTRAVENOUS

## 2020-07-07 MED ORDER — LEVOTHYROXINE SODIUM 112 MCG PO TABS
112.0000 ug | ORAL_TABLET | Freq: Every day | ORAL | Status: DC
  Administered 2020-07-08 – 2020-07-11 (×4): 112 ug via ORAL
  Filled 2020-07-07 (×4): qty 1

## 2020-07-07 MED ORDER — ASCORBIC ACID 500 MG PO TABS
500.0000 mg | ORAL_TABLET | Freq: Every day | ORAL | Status: DC
  Administered 2020-07-08 – 2020-07-11 (×4): 500 mg via ORAL
  Filled 2020-07-07 (×4): qty 1

## 2020-07-07 MED ORDER — CLOPIDOGREL BISULFATE 75 MG PO TABS
75.0000 mg | ORAL_TABLET | Freq: Every day | ORAL | Status: DC
  Administered 2020-07-08 – 2020-07-11 (×4): 75 mg via ORAL
  Filled 2020-07-07 (×4): qty 1

## 2020-07-07 MED ORDER — TIOTROPIUM BROMIDE MONOHYDRATE 18 MCG IN CAPS: 1.0000 | ORAL_CAPSULE | Freq: Every day | RESPIRATORY_TRACT | Status: DC

## 2020-07-07 MED ORDER — ENOXAPARIN SODIUM 40 MG/0.4ML ~~LOC~~ SOLN
40.0000 mg | SUBCUTANEOUS | Status: DC
  Administered 2020-07-08 – 2020-07-10 (×4): 40 mg via SUBCUTANEOUS
  Filled 2020-07-07 (×4): qty 0.4

## 2020-07-07 MED ORDER — ROSUVASTATIN CALCIUM 10 MG PO TABS
10.0000 mg | ORAL_TABLET | Freq: Every day | ORAL | Status: DC
  Administered 2020-07-08 – 2020-07-11 (×4): 10 mg via ORAL
  Filled 2020-07-07 (×4): qty 1

## 2020-07-07 MED ORDER — FLUTICASONE PROPIONATE 50 MCG/ACT NA SUSP
2.0000 | Freq: Every day | NASAL | Status: DC
  Administered 2020-07-08 – 2020-07-11 (×4): 2 via NASAL
  Filled 2020-07-07: qty 16

## 2020-07-07 MED ORDER — ACETAMINOPHEN 325 MG PO TABS
650.0000 mg | ORAL_TABLET | Freq: Four times a day (QID) | ORAL | Status: DC | PRN
  Administered 2020-07-08 – 2020-07-11 (×4): 650 mg via ORAL
  Filled 2020-07-07 (×4): qty 2

## 2020-07-07 MED ORDER — ALBUTEROL SULFATE HFA 108 (90 BASE) MCG/ACT IN AERS: 1.0000 | INHALATION_SPRAY | RESPIRATORY_TRACT | Status: DC | PRN

## 2020-07-07 MED ORDER — GUAIFENESIN-DM 100-10 MG/5ML PO SYRP
10.0000 mL | ORAL_SOLUTION | ORAL | Status: DC | PRN
  Administered 2020-07-08 – 2020-07-10 (×2): 10 mL via ORAL
  Filled 2020-07-07 (×2): qty 10

## 2020-07-07 MED ORDER — PANTOPRAZOLE SODIUM 20 MG PO TBEC
20.0000 mg | DELAYED_RELEASE_TABLET | Freq: Two times a day (BID) | ORAL | Status: DC
  Administered 2020-07-08 – 2020-07-11 (×8): 20 mg via ORAL
  Filled 2020-07-07 (×8): qty 1

## 2020-07-07 MED ORDER — HYDROCOD POLST-CPM POLST ER 10-8 MG/5ML PO SUER: 5.0000 mL | Freq: Two times a day (BID) | ORAL | Status: DC | PRN

## 2020-07-07 MED ORDER — DULOXETINE HCL 60 MG PO CPEP
60.0000 mg | ORAL_CAPSULE | Freq: Every day | ORAL | Status: DC
  Administered 2020-07-08 – 2020-07-11 (×4): 60 mg via ORAL
  Filled 2020-07-07 (×3): qty 1
  Filled 2020-07-07: qty 2

## 2020-07-07 MED ORDER — ONDANSETRON HCL 4 MG PO TABS
4.0000 mg | ORAL_TABLET | Freq: Four times a day (QID) | ORAL | Status: DC | PRN
  Administered 2020-07-10: 4 mg via ORAL
  Filled 2020-07-07: qty 1

## 2020-07-07 MED ORDER — SODIUM CHLORIDE 0.9 % IV SOLN
1000.0000 mL | INTRAVENOUS | Status: AC
  Administered 2020-07-08 (×2): 1000 mL via INTRAVENOUS

## 2020-07-07 MED ORDER — ONDANSETRON HCL 4 MG/2ML IJ SOLN
4.0000 mg | Freq: Four times a day (QID) | INTRAMUSCULAR | Status: DC | PRN
  Filled 2020-07-07: qty 2

## 2020-07-07 MED ORDER — DEXAMETHASONE 4 MG PO TABS
6.0000 mg | ORAL_TABLET | ORAL | Status: DC
  Administered 2020-07-08 – 2020-07-10 (×4): 6 mg via ORAL
  Filled 2020-07-07 (×3): qty 2
  Filled 2020-07-07: qty 1

## 2020-07-07 MED ORDER — SENNOSIDES-DOCUSATE SODIUM 8.6-50 MG PO TABS
1.0000 | ORAL_TABLET | Freq: Every evening | ORAL | Status: DC | PRN
  Filled 2020-07-07: qty 1

## 2020-07-07 NOTE — ED Notes (Signed)
[name] [phone], would like an update.

## 2020-07-07 NOTE — H&P (Signed)
History and Physical    [name] [id number] DOB: [birth year] DOA: 07/07/2020  PCP: Tonia Ghent, MD  Patient coming from: Home via EMS  I have personally briefly reviewed patient's old medical records in [place]  Chief Complaint: Generalized weakness  HPI: [name] is a 86 y.o. female with medical history significant for COPD, chronic respiratory failure with hypoxia on 3-3.5 L of home O2 via [place] at home, CAD s/p DES, hypertension, hyperlipidemia, hypothyroidism, breast cancer s/p lumpectomy on anastrozole, depression/anxiety who presents to the ED for evaluation of generalized weakness.  Patient states over the last 3 days she has been feeling generally weak with poor appetite and has not been eating much.  Earlier today she was try to get out of bed and just feels so weak that she cannot walk.  She laid down on the floor and was trying to crawl to the bathroom but felt too weak to move.  She cannot get herself back up on the bed.  She says she did not fall or injure herself.  She reports a new cough productive of clear sputum which is significantly worse than her baseline.  She reports chronic shortness of breath which she says is not really changed from her usual.  She denies any subjective fevers, chills, diaphoresis, nausea, vomiting, abdominal pain, dysuria, chest pain.  She says she did receive 2 doses of the Pfizer Covid vaccine in the past.  She says has not yet been able to get the booster shot as she is not driving on her own.  ED Course:  Initial vitals showed BP 125/78, pulse 74, RR 16, temp 98.1 F, SPO2 92% on 3 L O2 via [place].  Labs show sodium 137, potassium 3.7, bicarb 32, BUN 14, creatinine 0.62, LFTs within normal limits, WBC 4.3, hemoglobin 10.7, platelets 128,000, lactic acid 1.3, D-dimer 0.94, procalcitonin <0.10.  BNP 167.8, high-sensitivity troponin I 42 > 39, CK 260.  Portable chest x-ray is negative for focal consolidation or  effusion.  Streaky opacities at the left lung base are seen, similar to prior x-ray September 14, 2019.  CT head and cervical spine without contrast are negative for skull fracture or intracranial hemorrhage.  No cervical spine fracture or subluxation seen.  Progressive moderate diffuse cerebral and cerebellar atrophy are seen.  Stable small old left cerebellar infarct is noted.  Patient was attempted to ambulate with help from nurse tech.  She was unable to stand on her own and O2 saturation dropped to 81% on standing.  Patient was started on IV fluids and the hospitalist service was consulted to admit for further evaluation and management.  Blood cultures were obtained and pending.  SARS-CoV-2 PCR is positive.  Review of Systems: All systems reviewed and are negative except as documented in history of present illness above.   Past Medical History:  Diagnosis Date  . Abdominal pain 04/05/2017  . Allergic rhinitis, cause unspecified   . Allergy, unspecified not elsewhere classified   . Anemia, unspecified 04/10/2008   Qualifier: Diagnosis of  By: Linna Darner MD, Gwyndolyn Saxon    . Anxiety   . Anxiety state 03/01/2008   Qualifier: Diagnosis of  By: Council Mechanic MD, Hilaria Ota   . Asthma   . Breast cancer ([facility]) 08/2018   right breast  . Breast mass 08/28/2018  . Chronic kidney disease    frequency  . COMMON MIGRAINE 12/24/2006   Qualifier: Diagnosis of  By: Fuller Plan CMA (AAMA), [place]    .  COPD (chronic obstructive pulmonary disease) ([place])   . Degeneration of intervertebral disc, site unspecified   . Diverticulosis of colon (without mention of hemorrhage)   . DNR (do not resuscitate) 09/08/2019  . Dysphagia 02/18/2019  . Encounter for chronic pain management 07/26/2016   Indication for chronic opioid: chronic back pain Medication and dose:  Oxycodone 10mg  TID # pills per month: 90 Last UDS date: 04/15/15 Pain contract signed (Y/N):  yes Date narcotic database last reviewed (include red flags):  05/06/17  .  Esophageal reflux   . Fibromyalgia 12/24/2006   Qualifier: Diagnosis of  By: Fuller Plan CMA (AAMA), Lugene    . Headache   . HTN (hypertension) 06/09/2013  . Hyperlipidemia with target LDL less than 70 12/24/2006   Qualifier: Diagnosis of  By: Fuller Plan CMA (AAMA), Lugene    . Hypokalemia 05/24/2009   Qualifier: Diagnosis of  By: Lacretia Nicks    . Hypothyroidism 12/24/2006   Qualifier: Diagnosis of  By: Fuller Plan CMA (AAMA), Lugene    . Insomnia 12/24/2006   she had trials of antihistamines and TCAs prev w/o effect.     . Insomnia, unspecified   . Lower GI bleeding 09/09/2019  . Myalgia and myositis, unspecified   . Need for prophylactic hormone replacement therapy (postmenopausal)   . Osteoarthritis of right knee 08/27/2011   R knee pain, s/p arthroscopy 2012 per [name]   . Osteoporosis 06/23/2007   Qualifier: Diagnosis of  By: Council Mechanic MD, Hilaria Ota   . Osteoporosis, unspecified   . Other abnormal blood chemistry   . Other and unspecified hyperlipidemia   . Other chronic pain   . Presence of drug coated stent in right coronary artery 01/30/2019  . Stroke [facility])    mini strokes  . Syncope 01/24/2017  . Thoracic aortic aneurysm ([place]) 04/10/2017  . Unspecified essential hypertension   . Unspecified hypothyroidism   . Vitamin D deficiency 09/26/2016    Past Surgical History:  Procedure Laterality Date  . BACK SURGERY    . breast cystectomy    . CORONARY/GRAFT ACUTE MI REVASCULARIZATION N/A 01/29/2019   Procedure: CORONARY/GRAFT ACUTE MI REVASCULARIZATION;  Surgeon: Belva Crome, MD;  Location: [facility];  Service: Cardiovascular;  Laterality: N/A;  . JOINT REPLACEMENT     shoulder right  . KNEE ARTHROSCOPY     Right knee 2012  . MASS EXCISION Left 04/07/2015   Procedure: MINOR EXCISION OF MASS LEFT SMALL FINGER;  Surgeon: Leanora Cover, MD;  Location: [facility];  Service: Orthopedics;  Laterality: Left;  . SHOULDER SURGERY    . TOTAL KNEE ARTHROPLASTY   12/11/2011   Procedure: TOTAL KNEE ARTHROPLASTY;  Surgeon: Johnny Bridge, MD;  Location: [facility];  Service: Orthopedics;  Laterality: Right;  . UMBILICAL HERNIA REPAIR N/A 06/09/2013   Procedure: EXPLORATIORY LAPAROTOMY, HERNIA REPAIR UMBILICAL, INSERTION OF MESH;  Surgeon: Haywood Lasso, MD;  Location: [facility];  Service: General;  Laterality: N/A;    Social History:  reports that she quit smoking about 35 years ago. Her smoking use included cigarettes. She has a 15.00 pack-year smoking history. She has never used smokeless tobacco. She reports that she does not drink alcohol and does not use drugs.  Allergies  Allergen Reactions  . Doxycycline Other (See Comments)    GI upset  . Sulfadiazine Other (See Comments)    REACTION: Fever, aches  . Sulfa Antibiotics Rash    Family History  Problem Relation Age of Onset  . Lung  cancer Brother        x2  . Hypertension Father   . Heart disease Father   . Heart disease Mother   . Asthma Sister      Prior to Admission medications   Medication Sig Start Date End Date Taking? Authorizing Provider  albuterol (PROAIR HFA) 108 (90 Base) MCG/ACT inhaler Inhale 1-2 puffs into the lungs every 6 (six) hours as needed for wheezing or shortness of breath (Okay to fill with Ventolin or albuterol HFA). 10/30/19   Tonia Ghent, MD  alendronate (FOSAMAX) 70 MG tablet Take 1 tablet (70 mg total) by mouth once a week. Take with a full glass of water on an empty stomach. 09/21/19   Magrinat, Virgie Dad, MD  ALPRAZolam Duanne Moron) 1 MG tablet TAKE 1/2 TABLET BY MOUTH TWO OR THREE TIMES DAILY AS NEEDED FOR ANXIETY 06/13/20   Tonia Ghent, MD  anastrozole (ARIMIDEX) 1 MG tablet Take 1 tablet (1 mg total) by mouth daily. 09/21/19   Magrinat, Virgie Dad, MD  busPIRone (BUSPAR) 5 MG tablet TAKE ONE TABLET BY MOUTH IN THE MORNING AND IN THE EVENING 06/13/20   Tonia Ghent, MD  Calcium Carbonate (CALTRATE 600 PO) Take 600 mg by mouth daily.     [provider]  cholecalciferol (VITAMIN D) 1000 units tablet Take 1 tablet (1,000 Units total) by mouth daily. 09/25/16   Tonia Ghent, MD  clopidogrel (PLAVIX) 75 MG tablet Take 1 tablet (75 mg total) by mouth daily. 04/18/20   Belva Crome, MD  diclofenac sodium (VOLTAREN) 1 % GEL APPLY 4 GRAMS TO AFFECTED AREA 2 TIMES A DAY. 08/26/17   Tonia Ghent, MD  DULoxetine (CYMBALTA) 60 MG capsule NEW PRESCRIPTION REQUEST: TAKE ONE CAPSULE BY MOUTH DAILY 09/28/19   Tonia Ghent, MD  ferrous sulfate (FERROUSUL) 325 (65 FE) MG tablet Take 1 tablet (325 mg total) by mouth daily with breakfast. 08/22/18   Tonia Ghent, MD  fluticasone (FLONASE) 50 MCG/ACT nasal spray PLACE 2 SPRAYS INTO BOTH NOSTRILS DAILY. 01/28/20   Tonia Ghent, MD  hydroxypropyl methylcellulose / hypromellose (ISOPTO TEARS / GONIOVISC) 2.5 % ophthalmic solution Place 1 drop into both eyes 3 (three) times daily as needed for dry eyes.    [provider]  levothyroxine (SYNTHROID) 112 MCG tablet NEW PRESCRIPTION REQUEST: TAKE ONE TABLET BY MOUTH DAILY 09/28/19   Tonia Ghent, MD  Multiple Vitamin (MULTIVITAMIN WITH MINERALS) TABS tablet Take 1 tablet by mouth daily.    [provider]  Multiple Vitamins-Minerals (OCUVITE PRESERVISION PO) Take 1 tablet by mouth daily.    [provider]  nitroGLYCERIN (NITROSTAT) 0.4 MG SL tablet Place 1 tablet (0.4 mg total) under the tongue every 5 (five) minutes as needed for chest pain. 02/23/20   Belva Crome, MD  OXYGEN Inhale 3-3.5 L into the lungs continuous.    [provider]  pantoprazole (PROTONIX) 20 MG tablet TAKE ONE TABLET BY MOUTH IN THE MORNING AND IN THE EVENING TO REPLACE OMEPRAZOLE DUE TO INTERACTIONS. 02/18/20   Tonia Ghent, MD  rosuvastatin (CRESTOR) 10 MG tablet TAKE ONE TABLET BY MOUTH DAILY 05/23/20   Tonia Ghent, MD  tiotropium (SPIRIVA HANDIHALER) 18 MCG inhalation capsule PLACE 1 CAPSULE INTO INHALER AND INHALE BY MOUTH DAILY  02/19/20   Tonia Ghent, MD  traZODone (DESYREL) 50 MG tablet TAKE THREE TABLETS BY MOUTH AT BEDTIME AS NEEDED 03/25/20   Tonia Ghent, MD  bisoprolol (ZEBETA) 5 MG tablet Take 0.5 tablets (2.5 mg total) by mouth daily. 01/31/19 09/18/19  Belva Crome, MD  gabapentin (NEURONTIN) 300 MG capsule 2 tabs in the AM and 1 tab in the afternoon Patient not taking: Reported on 09/07/2019 12/30/18 09/14/19  Tonia Ghent, MD    Physical Exam: Vitals:   07/07/20 1900 07/07/20 1915 07/07/20 1930 07/07/20 2000  BP: 138/66   (!) 157/97  Pulse: 61 73 66 70  Resp:  18 20 (!) 27  Temp:      TempSrc:      SpO2: 100% 100% 100% 97%  Weight:      Height:       Constitutional: Elderly woman resting in bed in the right lateral decubitus position.  NAD, calm, comfortable Eyes: PERRL, lids and conjunctivae normal ENMT: Mucous membranes are moist. Posterior pharynx clear of any exudate or lesions.Normal dentition.  Neck: normal, supple, no masses. Respiratory: clear to auscultation bilaterally, no wheezing, no crackles. Normal respiratory effort. No accessory muscle use.  Cardiovascular: Regular rate and rhythm, systolic murmur present. No extremity edema. 2+ pedal pulses. Abdomen: no tenderness, no masses palpated. No hepatosplenomegaly. Bowel sounds positive.  Musculoskeletal: no clubbing / cyanosis. No joint deformity upper and lower extremities. Good ROM, no contractures. Normal muscle tone.  Skin: no rashes, lesions, ulcers. No induration Neurologic: CN 2-12 grossly intact. Sensation intact, Strength 5/5 in all 4.  Psychiatric: Normal judgment and insight. Alert and oriented x 3. Normal mood.   Labs on Admission: I have personally reviewed following labs and imaging studies  CBC: Recent Labs  Lab 07/07/20 1538  WBC 4.3  NEUTROABS 3.6  HGB 10.7*  HCT 33.9*  MCV 103.7*  PLT 0000000*   Basic Metabolic Panel: Recent Labs  Lab 07/07/20 1538  NA 137  K 3.7  CL 96*  CO2 32  GLUCOSE 108*  BUN  14  CREATININE 0.62  CALCIUM 8.8*   GFR: Estimated Creatinine Clearance: 39.2 mL/min (by C-G formula based on SCr of 0.62 mg/dL). Liver Function Tests: Recent Labs  Lab 07/07/20 1538  AST 40  ALT 15  ALKPHOS 38  BILITOT 0.5  PROT 7.1  ALBUMIN 3.7   No results for input(s): LIPASE, AMYLASE in the last 168 hours. No results for input(s): AMMONIA in the last 168 hours. Coagulation Profile: No results for input(s): INR, PROTIME in the last 168 hours. Cardiac Enzymes: Recent Labs  Lab 07/07/20 1538  CKTOTAL 260*   BNP (last 3 results) No results for input(s): PROBNP in the last 8760 hours. HbA1C: No results for input(s): HGBA1C in the last 72 hours. CBG: No results for input(s): GLUCAP in the last 168 hours. Lipid Profile: Recent Labs    07/07/20 1538  TRIG 37   Thyroid Function Tests: No results for input(s): TSH, T4TOTAL, FREET4, T3FREE, THYROIDAB in the last 72 hours. Anemia Panel: Recent Labs    07/07/20 1538  FERRITIN 34   Urine analysis:    Component Value Date/Time   COLORURINE YELLOW 09/14/2019 1701   APPEARANCEUR CLEAR 09/14/2019 1701   LABSPEC 1.020 09/14/2019 1701   PHURINE 6.0 09/14/2019 1701   GLUCOSEU NEGATIVE 09/14/2019 1701   GLUCOSEU NEGATIVE 10/24/2016 1429   HGBUR NEGATIVE 09/14/2019 1701   BILIRUBINUR NEGATIVE 09/14/2019 1701   BILIRUBINUR negative 09/20/2016 1625   KETONESUR NEGATIVE 09/14/2019 1701   PROTEINUR NEGATIVE 09/14/2019 1701   UROBILINOGEN 0.2 10/24/2016 1429   NITRITE NEGATIVE 09/14/2019 1701   LEUKOCYTESUR NEGATIVE 09/14/2019 1701  Radiological Exams on Admission: CT Head Wo Contrast  Result Date: 07/07/2020 CLINICAL DATA:  Found on the floor. EXAM: CT HEAD WITHOUT CONTRAST CT CERVICAL SPINE WITHOUT CONTRAST TECHNIQUE: Multidetector CT imaging of the head and cervical spine was performed following the standard protocol without intravenous contrast. Multiplanar CT image reconstructions of the cervical spine were also  generated. COMPARISON:  01/23/2017 FINDINGS: CT HEAD FINDINGS Brain: Moderately enlarged ventricles and subarachnoid spaces with mild progression. Stable left lateral frontal subdural hygroma or arachnoid cyst. Marked patchy white matter low density in both cerebral hemispheres with progression. Stable small, old left cerebellar infarct. No intracranial hemorrhage, mass lesion or CT evidence of acute infarction. Vascular: No hyperdense vessel or unexpected calcification. Skull: Normal. Negative for fracture or focal lesion. Sinuses/Orbits: Marked bilateral frontal and ethmoid sinus mucosal thickening with progression. Small amount of sphenoid sinus fluid. Status post bilateral cataract extraction. Other: None. CT CERVICAL SPINE FINDINGS Alignment: Stable straightening of the normal cervical lordosis and dextroconvex cervicothoracic scoliosis. No subluxations. Skull base and vertebrae: No acute fracture. No primary bone lesion or focal pathologic process. Soft tissues and spinal canal: No prevertebral fluid or swelling. No visible canal hematoma. Disc levels: Bilateral facet degenerative changes throughout the cervical spine. Upper chest: Clear lung apices. Other: Bilateral carotid artery atheromatous calcifications. IMPRESSION: 1. No skull fracture or intracranial hemorrhage. 2. No cervical spine fracture or subluxation. 3. Progressive moderate diffuse cerebral and cerebellar atrophy. 4. Marked chronic small vessel white matter ischemic changes in both cerebral hemispheres with progression. 5. Stable small, old left cerebellar infarct. 6. Stable left lateral frontal subdural hygroma or arachnoid cyst. 7. Progressive marked chronic bilateral frontal sinusitis with possible acute sphenoid sinusitis. 8. Bilateral carotid artery atheromatous calcifications. 9. Bilateral facet degenerative changes throughout the cervical spine. Electronically Signed   By: Claudie Revering M.D.   On: 07/07/2020 16:43   CT Cervical Spine Wo  Contrast  Result Date: 07/07/2020 CLINICAL DATA:  Found on the floor. EXAM: CT HEAD WITHOUT CONTRAST CT CERVICAL SPINE WITHOUT CONTRAST TECHNIQUE: Multidetector CT imaging of the head and cervical spine was performed following the standard protocol without intravenous contrast. Multiplanar CT image reconstructions of the cervical spine were also generated. COMPARISON:  01/23/2017 FINDINGS: CT HEAD FINDINGS Brain: Moderately enlarged ventricles and subarachnoid spaces with mild progression. Stable left lateral frontal subdural hygroma or arachnoid cyst. Marked patchy white matter low density in both cerebral hemispheres with progression. Stable small, old left cerebellar infarct. No intracranial hemorrhage, mass lesion or CT evidence of acute infarction. Vascular: No hyperdense vessel or unexpected calcification. Skull: Normal. Negative for fracture or focal lesion. Sinuses/Orbits: Marked bilateral frontal and ethmoid sinus mucosal thickening with progression. Small amount of sphenoid sinus fluid. Status post bilateral cataract extraction. Other: None. CT CERVICAL SPINE FINDINGS Alignment: Stable straightening of the normal cervical lordosis and dextroconvex cervicothoracic scoliosis. No subluxations. Skull base and vertebrae: No acute fracture. No primary bone lesion or focal pathologic process. Soft tissues and spinal canal: No prevertebral fluid or swelling. No visible canal hematoma. Disc levels: Bilateral facet degenerative changes throughout the cervical spine. Upper chest: Clear lung apices. Other: Bilateral carotid artery atheromatous calcifications. IMPRESSION: 1. No skull fracture or intracranial hemorrhage. 2. No cervical spine fracture or subluxation. 3. Progressive moderate diffuse cerebral and cerebellar atrophy. 4. Marked chronic small vessel white matter ischemic changes in both cerebral hemispheres with progression. 5. Stable small, old left cerebellar infarct. 6. Stable left lateral frontal  subdural hygroma or arachnoid cyst. 7. Progressive marked chronic bilateral  frontal sinusitis with possible acute sphenoid sinusitis. 8. Bilateral carotid artery atheromatous calcifications. 9. Bilateral facet degenerative changes throughout the cervical spine. Electronically Signed   By: Claudie Revering M.D.   On: 07/07/2020 16:43   DG Chest Port 1 View  Result Date: 07/07/2020 CLINICAL DATA:  Mental status change.  Cough. EXAM: PORTABLE CHEST 1 VIEW COMPARISON:  September 14, 2019. FINDINGS: Cardiomediastinal silhouette is accentuated by AP portable technique. No new consolidation. Similar streaky opacities at the left lung base, likely atelectasis/scar. No visible pleural effusions or pneumothorax on this supine radiograph. No acute osseous abnormality. IMPRESSION: No evidence of acute cardiopulmonary disease. Electronically Signed   By: Margaretha Sheffield MD   On: 07/07/2020 15:32    EKG: Personally reviewed. Sinus rhythm without acute ischemic changes.  PVC present.  Not significantly changed when compared to prior.  Assessment/Plan Principal Problem:   Acute on chronic respiratory failure with hypoxia (HCC) Active Problems:   Hypothyroidism   Hyperlipidemia with target LDL less than 70   Gold C Copd with asthmatic bronchitis component and chronic resp failure   HTN (hypertension)   Coronary artery disease involving native coronary artery of native heart with unstable angina pectoris ([place])   History of CVA (cerebrovascular accident)   Generalized weakness  Ivee Swanigan is a 86 y.o. female with medical history significant for COPD, chronic respiratory failure with hypoxia on 3-3.5 L of home O2 via [place] at home, CAD s/p DES, hypertension, hyperlipidemia, hypothyroidism, breast cancer s/p lumpectomy on anastrozole, depression/anxiety who is admitted with acute on chronic respiratory failure with hypoxia and generalized weakness.  Acute on chronic respiratory failure with hypoxia in setting of  COVID-19 viral infection: She is found to be Covid positive.  She has a history of COPD with chronic respiratory failure requiring 3-3.5 L O2 via [place] at all times.  She was noted to have significant hypoxia to 81% on standing on her home O2.  Chest x-ray is reassuring without evidence of an pneumonia.  No active wheezing at time of admission.  She does have increased cough and sputum production compared to baseline suggestive of associated COVID-19 viral bronchitis. -Start oral Decadron 6 mg daily -Will hold off on starting IV remdesivir at this time -Currently maintaining O2 saturation >92% on home 3 L while at rest -Incentive spirometer, flutter valve, albuterol as needed  Generalized weakness: Secondary to viral illness and poor oral intake.  Patient states that she did not fall at home or injure herself.  CT head and cervical spine reassuring. -PT/OT eval -Gentle IV fluid hydration overnight -Fall precautions  COPD: Continue Decadron, supplemental O2, albuterol, further management as above.  Resume home Spiriva.  CAD: Denies any chest pain.  Continue Plavix and rosuvastatin.  History of CVA: Continue Plavix and rosuvastatin.  Hypertension: Not currently requiring antihypertensives.  Hypothyroidism: Continue Synthroid.  Hyperlipidemia: Continue rosuvastatin.  Depression/anxiety: Continue BuSpar and Cymbalta.  DVT prophylaxis: Lovenox Code Status: DNR, confirmed with patient Family Communication: Discussed with patient, she has discussed with family Disposition Plan: From home and likely discharge to home pending improvement in respiratory status and PT/OT eval Consults called: None Admission status:  Status is: Observation  The patient remains OBS appropriate and will d/c before 2 midnights.  Dispo: The patient is from: Home              Anticipated d/c is to: Home              Anticipated d/c date is: 1 day  Patient currently is not medically stable to  d/c.   Darreld Mclean MD [facility]  If 7PM-7AM, please contact night-coverage [email or web address]  07/07/2020, 10:10 PM

## 2020-07-07 NOTE — ED Triage Notes (Signed)
Per EMS, pt's nephew found her on the floor today at 1PM. Unsure how long she was on floor. She fell but is not sure when or how. She has cough. She uses 3L at home. Sats are 96%. She reports 2 covid exposures during Christmas.   BP 130.90 CBG 120 RR 24 HR 82

## 2020-07-07 NOTE — ED Provider Notes (Signed)
[facility] Note   CSN: [id number] Arrival date & time: 07/07/20  1414     History Chief Complaint  Patient presents with  . Fall    [name] is a 86 y.o. female.  HPI Patient does not recall how long ago she fell or has been on the floor.  Her nephew found her today at 1 PM.  Patient denies that she is having any pain.  She reports she has had a lot of coughing recently.  She is on oxygen chronically 3 L per her report.  She reports she has felt more short of breath than typical.  No fever that she is aware of.  Patient does not recall any vomiting or diarrhea.  She reports she does feel extremely weak and does not even think she can sit up in the stretcher.  She reports she did have her 2 Covid vaccines but not the booster yet.  At Christmas, she reports there were a lot of people around.  There was concern of possible Covid exposure.    Past Medical History:  Diagnosis Date  . Abdominal pain 04/05/2017  . Allergic rhinitis, cause unspecified   . Allergy, unspecified not elsewhere classified   . Anemia, unspecified 04/10/2008   Qualifier: Diagnosis of  By: Alwyn Ren MD, Chrissie Noa    . Anxiety   . Anxiety state 03/01/2008   Qualifier: Diagnosis of  By: Hetty Ely MD, Franne Grip   . Asthma   . Breast cancer (HCC) 08/2018   right breast  . Breast mass 08/28/2018  . Chronic kidney disease    frequency  . COMMON MIGRAINE 12/24/2006   Qualifier: Diagnosis of  By: Etheleen Mayhew CMA (AAMA), [place]    . COPD (chronic obstructive pulmonary disease) (HCC)   . Degeneration of intervertebral disc, site unspecified   . Diverticulosis of colon (without mention of hemorrhage)   . DNR (do not resuscitate) 09/08/2019  . Dysphagia 02/18/2019  . Encounter for chronic pain management 07/26/2016   Indication for chronic opioid: chronic back pain Medication and dose:  Oxycodone 10mg  TID # pills per month: 90 Last UDS date: 04/15/15 Pain contract signed (Y/N):   yes Date narcotic database last reviewed (include red flags):  05/06/17  . Esophageal reflux   . Fibromyalgia 12/24/2006   Qualifier: Diagnosis of  By: 12/26/2006 CMA (AAMA), Lug[place]    . Headache   . HTN (hypertension) 06/09/2013  . Hyperlipidemia with target LDL less than 70 12/24/2006   Qualifier: Diagnosis of  By: 12/26/2006 CMA (AAMA), Lugene    . Hypokalemia 05/24/2009   Qualifier: Diagnosis of  By: 05/26/2009    . Hypothyroidism 12/24/2006   Qualifier: Diagnosis of  By: 12/26/2006 CMA (AAMA), Lugene    . Insomnia 12/24/2006   she had trials of antihistamines and TCAs prev w/o effect.     . Insomnia, unspecified   . Lower GI bleeding 09/09/2019  . Myalgia and myositis, unspecified   . Need for prophylactic hormone replacement therapy (postmenopausal)   . Osteoarthritis of right knee 08/27/2011   R knee pain, s/p arthroscopy 2012 per [name]   . Osteoporosis 06/23/2007   Qualifier: Diagnosis of  By: 06/25/2007 MD, Hetty Ely   . Osteoporosis, unspecified   . Other abnormal blood chemistry   . Other and unspecified hyperlipidemia   . Other chronic pain   . Presence of drug coated stent in right coronary artery 01/30/2019  . Stroke [facility])    mini strokes  .  Syncope 01/24/2017  . Thoracic aortic aneurysm ([place]) 04/10/2017  . Unspecified essential hypertension   . Unspecified hypothyroidism   . Vitamin D deficiency 09/26/2016    Patient Active Problem List   Diagnosis Date Noted  . Decreased appetite 09/20/2019  . Lower GI bleeding 09/09/2019  . DNR (do not resuscitate) 09/08/2019  . Anemia 09/07/2019  . Macrocytic anemia 09/07/2019  . Jerking 09/07/2019  . Fall at home, initial encounter 09/07/2019  . History of CVA (cerebrovascular accident) 09/07/2019  . History of breast cancer 09/07/2019  . Pelvic pain 03/25/2019  . Dysphagia 02/18/2019  . Coronary artery disease involving native coronary artery of native heart with unstable angina pectoris ([place]) 01/30/2019  . Presence of  drug coated stent in right coronary artery 01/30/2019  . Left main coronary artery disease - ~50-60% dLM (&70% dLAD) -- Plan Med Rx 01/30/2019  . Ischemic cardiomyopathy 01/30/2019  . Acute ST elevation myocardial infarction (STEMI) of inferior wall ([place]) 01/29/2019  . AAA (abdominal aortic aneurysm) ([place]) 12/31/2018  . Malignant neoplasm of upper-outer quadrant of right breast in female, estrogen receptor positive ([place]) 09/26/2018  . Breast mass 08/28/2018  . Bilateral impacted cerumen 08/14/2017  . Laryngopharyngeal reflux (LPR) 08/14/2017  . Presbycusis of both ears 08/14/2017  . Thoracic aortic aneurysm ([place]) 04/10/2017  . Abdominal pain 04/05/2017  . Low back pain 01/01/2017  . Medicare annual wellness visit, subsequent 09/26/2016  . Hard of hearing 09/26/2016  . Vitamin D deficiency 09/26/2016  . Advance care planning 09/26/2016  . Encounter for chronic pain management 07/26/2016  . Edema 10/21/2014  . Multinodular thyroid 07/07/2013  . HTN (hypertension) 06/09/2013  . Chronic respiratory failure ([place]) 05/13/2013  . Cough 05/02/2013  . Vertigo 12/31/2012  . Acute blood loss anemia 12/13/2011  . Osteoarthritis of right knee 08/27/2011  . Gold C Copd with asthmatic bronchitis component and chronic resp failure 06/26/2011  . Neck pain 04/02/2011  . RISK OF FALLING 08/17/2010  . Hypokalemia 05/24/2009  . Anemia, unspecified 04/10/2008  . Anxiety state 03/01/2008  . Osteoporosis 06/23/2007  . Chronic pain syndrome 12/26/2006  . Hypothyroidism 12/24/2006  . Hyperlipidemia with target LDL less than 70 12/24/2006  . COMMON MIGRAINE 12/24/2006  . GERD 12/24/2006  . DEGENERATIVE DISC DISEASE 12/24/2006  . Fibromyalgia 12/24/2006  . Insomnia 12/24/2006  . HYPERGLYCEMIA 12/24/2006    Past Surgical History:  Procedure Laterality Date  . BACK SURGERY    . breast cystectomy    . CORONARY/GRAFT ACUTE MI REVASCULARIZATION N/A 01/29/2019   Procedure: CORONARY/GRAFT ACUTE MI  REVASCULARIZATION;  Surgeon: Belva Crome, MD;  Location: [place] [facility];  Service: Cardiovascular;  Laterality: N/A;  . JOINT REPLACEMENT     shoulder right  . KNEE ARTHROSCOPY     Right knee 2012  . MASS EXCISION Left 04/07/2015   Procedure: MINOR EXCISION OF MASS LEFT SMALL FINGER;  Surgeon: Leanora Cover, MD;  Location: [place];  Service: Orthopedics;  Laterality: Left;  . SHOULDER SURGERY    . TOTAL KNEE ARTHROPLASTY  12/11/2011   Procedure: TOTAL KNEE ARTHROPLASTY;  Surgeon: Johnny Bridge, MD;  Location: [place];  Service: Orthopedics;  Laterality: Right;  . UMBILICAL HERNIA REPAIR N/A 06/09/2013   Procedure: EXPLORATIORY LAPAROTOMY, HERNIA REPAIR UMBILICAL, INSERTION OF MESH;  Surgeon: Haywood Lasso, MD;  Location: [place];  Service: General;  Laterality: N/A;     OB History   No obstetric history on file.     Family History  Problem Relation Age of Onset  . Lung cancer Brother        x2  . Hypertension Father   . Heart disease Father   . Heart disease Mother   . Asthma Sister     Social History   Tobacco Use  . Smoking status: Former Smoker    Packs/day: 1.50    Years: 10.00    Pack years: 15.00    Types: Cigarettes    Quit date: 07/09/1985    Years since quitting: 35.0  . Smokeless tobacco: Never Used  . Tobacco comment: 1 1/2 ppd x 20 years  Substance Use Topics  . Alcohol use: No    Alcohol/week: 0.0 standard drinks  . Drug use: No    Home Medications Prior to Admission medications   Medication Sig Start Date End Date Taking? Authorizing Provider  albuterol (PROAIR HFA) 108 (90 Base) MCG/ACT inhaler Inhale 1-2 puffs into the lungs every 6 (six) hours as needed for wheezing or shortness of breath (Okay to fill with Ventolin or albuterol HFA). 10/30/19   Tonia Ghent, MD  alendronate (FOSAMAX) 70 MG tablet Take 1 tablet (70 mg total) by mouth once a week. Take with a full glass of water on an empty stomach. 09/21/19   Magrinat, Virgie Dad, MD  ALPRAZolam Duanne Moron) 1 MG tablet TAKE 1/2 TABLET BY MOUTH TWO OR THREE TIMES DAILY AS NEEDED FOR ANXIETY 06/13/20   Tonia Ghent, MD  anastrozole (ARIMIDEX) 1 MG tablet Take 1 tablet (1 mg total) by mouth daily. 09/21/19   Magrinat, Virgie Dad, MD  busPIRone (BUSPAR) 5 MG tablet TAKE ONE TABLET BY MOUTH IN THE MORNING AND IN THE EVENING 06/13/20   Tonia Ghent, MD  Calcium Carbonate (CALTRATE 600 PO) Take 600 mg by mouth daily.     [provider]  cholecalciferol (VITAMIN D) 1000 units tablet Take 1 tablet (1,000 Units total) by mouth daily. 09/25/16   Tonia Ghent, MD  clopidogrel (PLAVIX) 75 MG tablet Take 1 tablet (75 mg total) by mouth daily. 04/18/20   Belva Crome, MD  diclofenac sodium (VOLTAREN) 1 % GEL APPLY 4 GRAMS TO AFFECTED AREA 2 TIMES A DAY. 08/26/17   Tonia Ghent, MD  DULoxetine (CYMBALTA) 60 MG capsule NEW PRESCRIPTION REQUEST: TAKE ONE CAPSULE BY MOUTH DAILY 09/28/19   Tonia Ghent, MD  ferrous sulfate (FERROUSUL) 325 (65 FE) MG tablet Take 1 tablet (325 mg total) by mouth daily with breakfast. 08/22/18   Tonia Ghent, MD  fluticasone (FLONASE) 50 MCG/ACT nasal spray PLACE 2 SPRAYS INTO BOTH NOSTRILS DAILY. 01/28/20   Tonia Ghent, MD  hydroxypropyl methylcellulose / hypromellose (ISOPTO TEARS / GONIOVISC) 2.5 % ophthalmic solution Place 1 drop into both eyes 3 (three) times daily as needed for dry eyes.    [provider]  levothyroxine (SYNTHROID) 112 MCG tablet NEW PRESCRIPTION REQUEST: TAKE ONE TABLET BY MOUTH DAILY 09/28/19   Tonia Ghent, MD  Multiple Vitamin (MULTIVITAMIN WITH MINERALS) TABS tablet Take 1 tablet by mouth daily.    [provider]  Multiple Vitamins-Minerals (OCUVITE PRESERVISION PO) Take 1 tablet by mouth daily.    [provider]  nitroGLYCERIN (NITROSTAT) 0.4 MG SL tablet Place 1 tablet (0.4 mg total) under the tongue every 5 (five) minutes as needed for chest pain. 02/23/20   Belva Crome,  MD  OXYGEN Inhale 3-3.5 L into the lungs continuous.    [provider]  pantoprazole (  PROTONIX) 20 MG tablet TAKE ONE TABLET BY MOUTH IN THE MORNING AND IN THE EVENING TO REPLACE OMEPRAZOLE DUE TO INTERACTIONS. 02/18/20   Tonia Ghent, MD  rosuvastatin (CRESTOR) 10 MG tablet TAKE ONE TABLET BY MOUTH DAILY 05/23/20   Tonia Ghent, MD  tiotropium (SPIRIVA HANDIHALER) 18 MCG inhalation capsule PLACE 1 CAPSULE INTO INHALER AND INHALE BY MOUTH DAILY 02/19/20   Tonia Ghent, MD  traZODone (DESYREL) 50 MG tablet TAKE THREE TABLETS BY MOUTH AT BEDTIME AS NEEDED 03/25/20   Tonia Ghent, MD  bisoprolol (ZEBETA) 5 MG tablet Take 0.5 tablets (2.5 mg total) by mouth daily. 01/31/19 09/18/19  Belva Crome, MD  gabapentin (NEURONTIN) 300 MG capsule 2 tabs in the AM and 1 tab in the afternoon Patient not taking: Reported on 09/07/2019 12/30/18 09/14/19  Tonia Ghent, MD    Allergies    Doxycycline, Sulfadiazine, and Sulfa antibiotics  Review of Systems   Review of Systems  10 systems reviewed and negative except as per HPI hysical Exam Updated Vital Signs BP 120/69   Pulse 70   Temp 98.1 F (36.7 C) (Oral)   Resp (!) 25   Ht 5\' 2"  (1.575 m)   Wt 59 kg   SpO2 100%   BMI 23.79 kg/m   Physical Exam Constitutional:      Comments: Patient is resting quietly.  She does awaken to light stimulus.  She is very fatigued in appearance.  She is on 4 L nasal cannula oxygen.  Mild tachypnea but not significant respiratory distress.  Oxygen saturation is reading 100% on 4 L.  HENT:     Mouth/Throat:     Mouth: Mucous membranes are dry.  Eyes:     Extraocular Movements: Extraocular movements intact.  Cardiovascular:     Rate and Rhythm: Normal rate and regular rhythm.  Pulmonary:     Comments: Mild tachypnea.  Decreased breath sounds at the bases.  No gross rhonchi or wheeze. Abdominal:     General: There is no distension.     Palpations: Abdomen is soft.     Tenderness: There is  no abdominal tenderness. There is no guarding.  Musculoskeletal:        General: No deformity. Normal range of motion.     Right lower leg: No edema.     Left lower leg: No edema.  Skin:    General: Skin is warm and dry.  Neurological:     Comments: Patient seems very fatigued.  Mildly confused.  She does awaken and answer questions appropriately.  Slightly hard of hearing.  She does follow commands for grip strength but reports she is too weak for sitting herself up in the stretcher.  Does not appear to have a focal motor deficit.     ED Results / Procedures / Treatments   Labs (all labs ordered are listed, but only abnormal results are displayed) Labs Reviewed  CBC WITH DIFFERENTIAL/PLATELET - Abnormal; Notable for the following components:      Result Value   RBC 3.27 (*)    Hemoglobin 10.7 (*)    HCT 33.9 (*)    MCV 103.7 (*)    Platelets 128 (*)    Lymphs Abs 0.4 (*)    All other components within normal limits  RESP PANEL BY RT-PCR (FLU A&B, COVID) ARPGX2  CULTURE, BLOOD (ROUTINE X 2)  CULTURE, BLOOD (ROUTINE X 2)  LACTIC ACID, PLASMA  LACTIC ACID, PLASMA  COMPREHENSIVE METABOLIC PANEL  D-DIMER, QUANTITATIVE (NOT AT [facility])  PROCALCITONIN  LACTATE DEHYDROGENASE  FERRITIN  TRIGLYCERIDES  FIBRINOGEN  C-REACTIVE PROTEIN  BRAIN NATRIURETIC PEPTIDE  CK  TROPONIN I (HIGH SENSITIVITY)    EKG EKG Interpretation  Date/Time:  Thursday July 07 2020 14:39:11 EST Ventricular Rate:  77 PR Interval:    QRS Duration: 64 QT Interval:  485 QTC Calculation: 549 R Axis:   12 Text Interpretation: Sinus rhythm Ventricular premature complex Anterior infarct, old similar to previous except no R wave V3. Confirmed by Charlesetta Shanks [id number]) on 07/07/2020 4:12:44 PM   Radiology DG Chest Port 1 View  Result Date: 07/07/2020 CLINICAL DATA:  Mental status change.  Cough. EXAM: PORTABLE CHEST 1 VIEW COMPARISON:  September 14, 2019. FINDINGS: Cardiomediastinal silhouette is  accentuated by AP portable technique. No new consolidation. Similar streaky opacities at the left lung base, likely atelectasis/scar. No visible pleural effusions or pneumothorax on this supine radiograph. No acute osseous abnormality. IMPRESSION: No evidence of acute cardiopulmonary disease. Electronically Signed   By: Margaretha Sheffield MD   On: 07/07/2020 15:32    Procedures Procedures (including critical care time)  Medications Ordered in ED Medications  0.9 %  sodium chloride infusion (1,000 mLs Intravenous New Bag/Given (Non-Interop) 07/07/20 1537)    ED Course  I have reviewed the triage vital signs and the nursing notes.  Pertinent labs & imaging results that were available during my care of the patient were reviewed by me and considered in my medical decision making (see chart for details).    MDM Rules/Calculators/A&P                         Patient is coming from home.  Unknown time down on the floor.  No evident fracture type injury.  Patient is denying any localizing pain.  She does have significant generalized weakness and fatigue.  She appears to be acutely ill with suspected infectious etiology.  We will proceed with broad diagnostic evaluation.  Patient is on baseline oxygen at 3 L but is currently on 4 L.  Patient is immunized for Covid although not booster.  There is concern for exposure at the holiday.  At this time, based on significant generalized weakness and age with mild tachypnea anticipate admission.  Diagnostic results are pending.  Dr. Roslynn Amble to follow-up on results for final disposition. Final Clinical Impression(s) / ED Diagnoses Final diagnoses:  Fall in home, initial encounter  Generalized weakness    Rx / DC Orders ED Discharge Orders    None       Charlesetta Shanks, MD 07/07/20 [phone]

## 2020-07-07 NOTE — ED Provider Notes (Signed)
Signout note  86 year old lady presents to ER with concern for fall.  Found on the floor by family.  Patient concern for generalized weakness, inability to walk.  Baseline 3 L nasal cannula.  Currently on 4 L.  chronicsystolic heart failure, status post acute STEMI7/23/20with DES RCA, breast cancer treated with anastrozole and lumpectomy, O2 requiring COPD, hyperlipidemia, and essential hypertension.  5:00 PM Received signout from [place], f/u labs  6:27 PM covid positive, will attempt to ambulate patient  6:57 PM patient unable to ambulate, will consult hospitalist for admission   Milagros Loll, MD 07/07/20 1857

## 2020-07-07 NOTE — ED Notes (Signed)
Patient attempted to ambulate with help from the tech, she was unable to stand on her own and when she tried to stand up her O2% went down to 81%.

## 2020-07-08 ENCOUNTER — Other Ambulatory Visit: Payer: Self-pay | Admitting: Family Medicine

## 2020-07-08 ENCOUNTER — Observation Stay (HOSPITAL_COMMUNITY): Payer: Medicare PPO

## 2020-07-08 DIAGNOSIS — J441 Chronic obstructive pulmonary disease with (acute) exacerbation: Secondary | ICD-10-CM | POA: Diagnosis present

## 2020-07-08 DIAGNOSIS — F411 Generalized anxiety disorder: Secondary | ICD-10-CM | POA: Diagnosis present

## 2020-07-08 DIAGNOSIS — C50911 Malignant neoplasm of unspecified site of right female breast: Secondary | ICD-10-CM | POA: Diagnosis present

## 2020-07-08 DIAGNOSIS — G894 Chronic pain syndrome: Secondary | ICD-10-CM | POA: Diagnosis present

## 2020-07-08 DIAGNOSIS — I10 Essential (primary) hypertension: Secondary | ICD-10-CM | POA: Diagnosis present

## 2020-07-08 DIAGNOSIS — K219 Gastro-esophageal reflux disease without esophagitis: Secondary | ICD-10-CM | POA: Diagnosis present

## 2020-07-08 DIAGNOSIS — H9190 Unspecified hearing loss, unspecified ear: Secondary | ICD-10-CM | POA: Diagnosis present

## 2020-07-08 DIAGNOSIS — W1830XA Fall on same level, unspecified, initial encounter: Secondary | ICD-10-CM | POA: Diagnosis present

## 2020-07-08 DIAGNOSIS — F32A Depression, unspecified: Secondary | ICD-10-CM | POA: Diagnosis present

## 2020-07-08 DIAGNOSIS — U071 COVID-19: Secondary | ICD-10-CM | POA: Diagnosis present

## 2020-07-08 DIAGNOSIS — I252 Old myocardial infarction: Secondary | ICD-10-CM | POA: Diagnosis not present

## 2020-07-08 DIAGNOSIS — J309 Allergic rhinitis, unspecified: Secondary | ICD-10-CM | POA: Diagnosis present

## 2020-07-08 DIAGNOSIS — M1711 Unilateral primary osteoarthritis, right knee: Secondary | ICD-10-CM | POA: Diagnosis present

## 2020-07-08 DIAGNOSIS — I712 Thoracic aortic aneurysm, without rupture: Secondary | ICD-10-CM | POA: Diagnosis present

## 2020-07-08 DIAGNOSIS — M797 Fibromyalgia: Secondary | ICD-10-CM | POA: Diagnosis present

## 2020-07-08 DIAGNOSIS — I248 Other forms of acute ischemic heart disease: Secondary | ICD-10-CM | POA: Diagnosis present

## 2020-07-08 DIAGNOSIS — Z66 Do not resuscitate: Secondary | ICD-10-CM | POA: Diagnosis present

## 2020-07-08 DIAGNOSIS — I25110 Atherosclerotic heart disease of native coronary artery with unstable angina pectoris: Secondary | ICD-10-CM | POA: Diagnosis present

## 2020-07-08 DIAGNOSIS — E039 Hypothyroidism, unspecified: Secondary | ICD-10-CM | POA: Diagnosis present

## 2020-07-08 DIAGNOSIS — E785 Hyperlipidemia, unspecified: Secondary | ICD-10-CM | POA: Diagnosis present

## 2020-07-08 DIAGNOSIS — J9621 Acute and chronic respiratory failure with hypoxia: Secondary | ICD-10-CM | POA: Diagnosis present

## 2020-07-08 DIAGNOSIS — I714 Abdominal aortic aneurysm, without rupture: Secondary | ICD-10-CM | POA: Diagnosis present

## 2020-07-08 DIAGNOSIS — I255 Ischemic cardiomyopathy: Secondary | ICD-10-CM | POA: Diagnosis present

## 2020-07-08 DIAGNOSIS — M810 Age-related osteoporosis without current pathological fracture: Secondary | ICD-10-CM | POA: Diagnosis present

## 2020-07-08 DIAGNOSIS — D638 Anemia in other chronic diseases classified elsewhere: Secondary | ICD-10-CM | POA: Diagnosis present

## 2020-07-08 DIAGNOSIS — M79601 Pain in right arm: Secondary | ICD-10-CM | POA: Diagnosis not present

## 2020-07-08 LAB — CBC WITH DIFFERENTIAL/PLATELET
Abs Immature Granulocytes: 0.01 10*3/uL (ref 0.00–0.07)
Basophils Absolute: 0 10*3/uL (ref 0.0–0.1)
Basophils Relative: 1 %
Eosinophils Absolute: 0 10*3/uL (ref 0.0–0.5)
Eosinophils Relative: 0 %
HCT: 31.6 % — ABNORMAL LOW (ref 36.0–46.0)
Hemoglobin: 10.4 g/dL — ABNORMAL LOW (ref 12.0–15.0)
Immature Granulocytes: 0 %
Lymphocytes Relative: 16 %
Lymphs Abs: 0.5 10*3/uL — ABNORMAL LOW (ref 0.7–4.0)
MCH: 33.8 pg (ref 26.0–34.0)
MCHC: 32.9 g/dL (ref 30.0–36.0)
MCV: 102.6 fL — ABNORMAL HIGH (ref 80.0–100.0)
Monocytes Absolute: 0.2 10*3/uL (ref 0.1–1.0)
Monocytes Relative: 5 %
Neutro Abs: 2.5 10*3/uL (ref 1.7–7.7)
Neutrophils Relative %: 78 %
Platelets: 131 10*3/uL — ABNORMAL LOW (ref 150–400)
RBC: 3.08 MIL/uL — ABNORMAL LOW (ref 3.87–5.11)
RDW: 13.7 % (ref 11.5–15.5)
WBC: 3.1 10*3/uL — ABNORMAL LOW (ref 4.0–10.5)
nRBC: 0 % (ref 0.0–0.2)

## 2020-07-08 LAB — COMPREHENSIVE METABOLIC PANEL
ALT: 17 U/L (ref 0–44)
AST: 44 U/L — ABNORMAL HIGH (ref 15–41)
Albumin: 3.5 g/dL (ref 3.5–5.0)
Alkaline Phosphatase: 32 U/L — ABNORMAL LOW (ref 38–126)
Anion gap: 9 (ref 5–15)
BUN: 12 mg/dL (ref 8–23)
CO2: 31 mmol/L (ref 22–32)
Calcium: 8.4 mg/dL — ABNORMAL LOW (ref 8.9–10.3)
Chloride: 104 mmol/L (ref 98–111)
Creatinine, Ser: 0.61 mg/dL (ref 0.44–1.00)
GFR, Estimated: >60 mL/min (ref >60)
Glucose, Bld: 117 mg/dL — ABNORMAL HIGH (ref 70–99)
Potassium: 3.6 mmol/L (ref 3.5–5.1)
Sodium: 144 mmol/L (ref 135–145)
Total Bilirubin: 0.5 mg/dL (ref 0.3–1.2)
Total Protein: 6.7 g/dL (ref 6.5–8.1)

## 2020-07-08 LAB — FERRITIN: Ferritin: 35 ng/mL (ref 11–307)

## 2020-07-08 LAB — C-REACTIVE PROTEIN: CRP: 0.8 mg/dL (ref <1.0)

## 2020-07-08 LAB — D-DIMER, QUANTITATIVE: D-Dimer, Quant: 1.04 ug/mL-FEU — ABNORMAL HIGH (ref 0.00–0.50)

## 2020-07-08 LAB — PHOSPHORUS: Phosphorus: 3.4 mg/dL (ref 2.5–4.6)

## 2020-07-08 LAB — MAGNESIUM: Magnesium: 1.9 mg/dL (ref 1.7–2.4)

## 2020-07-08 MED ORDER — ALPRAZOLAM 0.5 MG PO TABS
0.5000 mg | ORAL_TABLET | Freq: Every day | ORAL | Status: DC | PRN
  Administered 2020-07-08 – 2020-07-09 (×2): 0.5 mg via ORAL
  Filled 2020-07-08 (×2): qty 1

## 2020-07-08 MED ORDER — DICLOFENAC SODIUM 1 % TD GEL: 2.0000 g | Freq: Every day | TRANSDERMAL | Status: DC | PRN

## 2020-07-08 MED ORDER — TRAZODONE HCL 50 MG PO TABS
50.0000 mg | ORAL_TABLET | Freq: Every day | ORAL | Status: DC
  Administered 2020-07-08 – 2020-07-10 (×3): 50 mg via ORAL
  Filled 2020-07-08 (×3): qty 1

## 2020-07-08 MED ORDER — ALPRAZOLAM 0.5 MG PO TABS
0.5000 mg | ORAL_TABLET | Freq: Once | ORAL | Status: AC
  Administered 2020-07-08: 0.5 mg via ORAL
  Filled 2020-07-08: qty 1

## 2020-07-08 MED ORDER — VITAMIN D 25 MCG (1000 UNIT) PO TABS
1000.0000 [IU] | ORAL_TABLET | Freq: Every day | ORAL | Status: DC
  Administered 2020-07-08 – 2020-07-11 (×4): 1000 [IU] via ORAL
  Filled 2020-07-08 (×4): qty 1

## 2020-07-08 NOTE — Evaluation (Signed)
Occupational Therapy Evaluation Patient Details Name: [name] MRN: [id number] DOB: 03-Apr-1931 Today's Date: 07/08/2020    History of Present Illness [name] is a 86 y.o. female with medical history significant for COPD, chronic respiratory failure with hypoxia on 3-3.5 L of home O2 via [place] at home, CAD s/p DES, hypertension, hyperlipidemia, hypothyroidism, breast cancer s/p lumpectomy on anastrozole, depression/anxiety who presents to the ED for evaluation of generalized weakness. Found to be COVID positive.   Clinical Impression   [name] is an 86 year old woman normally modified independent who lives alone and uses a Rolator. On evaluation patient presents with generalized weakness, decreased activity tolerance, impaired cardiopulmonary status requiring baseline 3.5 liters [place], and impaired balance resulting in a significant decline in functional abilities. ON evaluation patient needing mod assist or bed mobility, min assist for standing and steadying, and mod-max assist for LB ADLs and set up to min assist for UB ADLs in seated position. Patient does not exhibit dyspnea or complain of shortness of breath. o2 sat's monitored with a predominantly bad pleth signal during activity. At rest patient at 96% on baseline o2. Patient will benefit from skilled OT services while in hospital to improve deficits and learn compensatory strategies as needed in order to return to PLOF.  At this time patient does not have the ability to return home safely and therapist recommends short term rehab. If patient and family refuse SNF placement recommend 24/7 assistance at home with [facility] services.     Follow Up Recommendations  SNF    Equipment Recommendations  3 in 1 bedside commode    Recommendations for Other Services       Precautions / Restrictions Precautions Precautions: Fall Restrictions Weight Bearing Restrictions: No      Mobility Bed Mobility Overal bed  mobility: Needs Assistance Bed Mobility: Supine to Sit;Sit to Supine     Supine to sit: Mod assist Sit to supine: Mod assist        Transfers Overall transfer level: Needs assistance Equipment used: None Transfers: Sit to/from [facility] Sit to Stand: Min assist Stand pivot transfers: Min assist       General transfer comment: Min assist to stand and steady at side of bed. Pat[place] performed transfer to straight back chair and marching in place holding onto back of chair that she tolerated approx 15 seconds due to fatigue.    Balance Overall balance assessment: Needs assistance Sitting-balance support: No upper extremity supported;Feet supported Sitting balance-Leahy Scale: Fair     Standing balance support: During functional activity;Bilateral upper extremity supported Standing balance-Leahy Scale: Poor                             ADL either performed or assessed with clinical judgement   ADL Overall ADL's : Needs assistance/impaired Eating/Feeding: Independent   Grooming: Sitting;Set up   Upper Body Bathing: Set up;Sitting   Lower Body Bathing: Moderate assistance;Set up;Sit to/from stand   Upper Body Dressing : Minimal assistance;Sitting   Lower Body Dressing: Maximal assistance;Sit to/from stand   Toilet Transfer: Minimal assistance;BSC;Stand-pivot   Toileting- Clothing Manipulation and Hygiene: Maximal assistance;Sit to/from stand       Functional mobility during ADLs: Minimal assistance;Rolling walker       Vision Patient Visual Report: No change from baseline       Perception     Praxis      Pertinent Vitals/Pain Pain Assessment: No/denies  pain     Hand Dominance Right   Extremity/Trunk Assessment Upper Extremity Assessment Upper Extremity Assessment: Generalized weakness   Lower Extremity Assessment Lower Extremity Assessment: Defer to PT evaluation   Cervical / Trunk Assessment Cervical / Trunk  Assessment: Normal   Communication Communication Communication: HOH   Cognition Arousal/Alertness: Awake/alert Behavior During Therapy: WFL for tasks assessed/performed Overall Cognitive Status: Within Functional Limits for tasks assessed                                     General Comments       Exercises     Shoulder Instructions      Home Living Family/patient expects to be discharged to:: Private residence Living Arrangements: Alone Available Help at Discharge: Family;Available PRN/intermittently Type of Home: House Home Access: Ramped entrance     Home Layout: One level     Bathroom Shower/Tub: Tub/shower unit;Walk-in shower         Home Equipment: Dan Humphreys - 2 wheels;Walker - 4 wheels;Shower seat;Wheelchair - manual;Transport chair;Bedside commode;Grab bars - tub/shower          Prior Functioning/Environment Level of Independence: Independent with assistive device(s)  Gait / Transfers Assistance Needed: Pt reports using rollator walker around the home. Pt reports last fall 10/19/17. ADL's / Homemaking Assistance Needed: Pt reports independent with bathing, dressing, microwave cooking. Pt reports her daughter comes weekly and cooks and cleans.   Comments: Pt on 3.5L O2 all the time, spouse passed away in 19-Oct-2017 and daughter offered for pt to move in with her but pt wants to live in own home        OT Problem List: Decreased strength;Decreased activity tolerance;Impaired balance (sitting and/or standing);Decreased knowledge of use of DME or AE;Cardiopulmonary status limiting activity      OT Treatment/Interventions: Self-care/ADL training;Therapeutic exercise;Energy conservation;DME and/or AE instruction;Patient/family education;Cognitive remediation/compensation;Balance training;Therapeutic activities    OT Goals(Current goals can be found in the care plan section) Acute Rehab OT Goals Patient Stated Goal: To live in her house OT Goal Formulation:  With patient Time For Goal Achievement: 07/22/20 Potential to Achieve Goals: Good  OT Frequency: Min 2X/week   Barriers to D/C: Decreased caregiver support          Co-evaluation PT/OT/SLP Co-Evaluation/Treatment: Yes Reason for Co-Treatment: For patient/therapist safety;To address functional/ADL transfers PT goals addressed during session: Mobility/safety with mobility;Balance OT goals addressed during session: ADL's and self-care      AM-PAC OT "6 Clicks" Daily Activity     Outcome Measure Help from another person eating meals?: A Little Help from another person taking care of personal grooming?: A Little Help from another person toileting, which includes using toliet, bedpan, or urinal?: A Lot Help from another person bathing (including washing, rinsing, drying)?: A Lot Help from another person to put on and taking off regular upper body clothing?: A Little Help from another person to put on and taking off regular lower body clothing?: A Lot 6 Click Score: 15   End of Session Equipment Utilized During Treatment: Oxygen Nurse Communication: Mobility status  Activity Tolerance: Patient limited by fatigue Patient left: in bed;with call bell/phone within reach  OT Visit Diagnosis: Unsteadiness on feet (R26.81);History of falling (Z91.81);Muscle weakness (generalized) (M62.81)                Time: 9381-8299 OT Time Calculation (min): 32 min Charges:  OT General Charges $OT Visit: 1 Visit  OT Evaluation $OT Eval Moderate Complexity: 1 Mod  Karlis Cregg, OTR/L [place]  Office [phone] Pager: [place] 07/08/2020, 10:30 AM

## 2020-07-08 NOTE — Progress Notes (Signed)
NP Bl[place] was paged because patient wants Ativan, it's ordered daily prn. Per patient she takes it 3x/day PRN.

## 2020-07-08 NOTE — ED Notes (Addendum)
Pt's daughter dropped off a plastic grocery bag for the patient. Inside the bag is glasses, ipad, ipad charger, flip phone, flip phone charger, blue blanket, hairbrush, magnifying glass, 3 pieces of mail, glasses, and multiple packs of batteries for her hearing aids. The patient is wearing a hearing aid in her left ear.

## 2020-07-08 NOTE — Evaluation (Signed)
Physical Therapy Evaluation Patient Details Name: [name] MRN: [id number] DOB: 1931/05/18 Today's Date: 07/08/2020   History of Present Illness  [name] is a 86 y.o. female with medical history significant for COPD, chronic respiratory failure with hypoxia on 3-3.5 L of home O2 via [place] at home, CAD s/p DES, hypertension, hyperlipidemia, hypothyroidism, breast cancer s/p lumpectomy on anastrozole, depression/anxiety who presents to the ED for evaluation of generalized weakness. Found to be COVID positive.  Clinical Impression  Pt admitted with above diagnosis. Pt currently requiring min-mod assist with mobility and fatigues easily with standing, only able to march in place for ~15 sec before needing seated rest break. Pt on 3.5L O2 during eval, which is baseline, able to continue conversation without labored breathing noted, poor SpO2 waveform varying from high 60s to 90s - so unsure how accurate. Pt SpO2 96% at rest with good waveform. PTA, pt independent and living on her own, daughter near by checks on her and provides meals and cleaning as needed. Pt currently a high fall risk and unsafe to d/c home alone so recommending SNF unless family is able to assist pt at home with [facility] services. Pt currently with functional limitations due to the deficits listed below (see PT Problem List). Pt will benefit from skilled PT to increase their independence and safety with mobility to allow discharge to the venue listed below.       Follow Up Recommendations SNF    Equipment Recommendations  3in1 (PT)    Recommendations for Other Services       Precautions / Restrictions Precautions Precautions: Fall Restrictions Weight Bearing Restrictions: No      Mobility  Bed Mobility Overal bed mobility: Needs Assistance Bed Mobility: Supine to Sit  Supine to sit: Mod assist Sit to supine: Mod assist   General bed mobility comments: cues to sequencing, assist to upright trunk to  sitting at EOB    Transfers Overall transfer level: Needs assistance Equipment used: None Transfers: Sit to/from [employer] Transfers Sit to Stand: Min assist Stand pivot transfers: Min assist    General transfer comment: initial transfer min A +2 with therapist positioned at either side of pt, unsteady and veralizes fear of falling but improves with time; min A to steady over to straight back chair at bedside and to manage lines for safety  Ambulation/Gait Ambulation/Gait assistance: Min assist  Assistive device: 2 person hand held assist Gait Pattern/deviations: Step-to pattern;Decreased stride length Gait velocity: decreased   General Gait Details: short, shuffling sidesteps at bedside with steadying assistance, able to march in place with chair positioned in front of her, fatigues after ~15 sec requiring return to sitting  Stairs            Wheelchair Mobility    Modified Rankin (Stroke Patients Only)       Balance Overall balance assessment: Needs assistance Sitting-balance support: No upper extremity supported;Feet supported Sitting balance-Leahy Scale: Fair Sitting balance - Comments: seated EOB   Standing balance support: During functional activity;Bilateral upper extremity supported Standing balance-Leahy Scale: Poor Standing balance comment: reliant on UE support        Pertinent Vitals/Pain Pain Assessment: No/denies pain    Home Living Family/patient expects to be discharged to:: Private residence Living Arrangements: Alone Available Help at Discharge: Family;Available PRN/intermittently Type of Home: House Home Access: Ramped entrance     Home Layout: One level Home Equipment: Walker - 2 wheels;Walker - 4 wheels;Shower seat;Wheelchair - manual;Transport chair;Bedside commode;Grab bars -  tub/shower      Prior Function Level of Independence: Independent with assistive device(s)   Gait / Transfers Assistance Needed: Pt reports using  rollator walker around the home. Pt reports last fall 11-06-2017.  ADL's / Homemaking Assistance Needed: Pt reports independent with bathing, dressing, microwave cooking. Pt reports her daughter comes weekly and cooks and cleans.  Comments: Pt on 3.5L O2 all the time, spouse passed away in 11-06-2017 and daughter offered for pt to move in with her but pt wants to live in own home     Hand Dominance   Dominant Hand: Right    Extremity/Trunk Assessment   Upper Extremity Assessment Upper Extremity Assessment: Defer to OT evaluation    Lower Extremity Assessment Lower Extremity Assessment: Generalized weakness (AROM WNL, strength 3/5 and denies numbness/tingling throughout BLE)    Cervical / Trunk Assessment Cervical / Trunk Assessment: Normal  Communication   Communication: HOH  Cognition Arousal/Alertness: Awake/alert Behavior During Therapy: WFL for tasks assessed/performed Overall Cognitive Status: Within Functional Limits for tasks assessed         General Comments General comments (skin integrity, edema, etc.): on 3.5L with SpO2 varying with poor waveform, able to continue conversation without SOB noted    Exercises     Assessment/Plan    PT Assessment Patient needs continued PT services  PT Problem List Decreased strength;Decreased activity tolerance;Decreased balance;Decreased mobility;Cardiopulmonary status limiting activity       PT Treatment Interventions DME instruction;Gait training;Functional mobility training;Therapeutic activities;Therapeutic exercise;Balance training;Patient/family education    PT Goals (Current goals can be found in the Care Plan section)  Acute Rehab PT Goals Patient Stated Goal: To live in her house PT Goal Formulation: With patient Time For Goal Achievement: 07/22/20 Potential to Achieve Goals: Good    Frequency Min 2X/week   Barriers to discharge        Co-evaluation PT/OT/SLP Co-Evaluation/Treatment: Yes Reason for Co-Treatment:  For patient/therapist safety;To address functional/ADL transfers PT goals addressed during session: Mobility/safety with mobility;Balance OT goals addressed during session: ADL's and self-care       AM-PAC PT "6 Clicks" Mobility  Outcome Measure Help needed turning from your back to your side while in a flat bed without using bedrails?: None Help needed moving from lying on your back to sitting on the side of a flat bed without using bedrails?: A Lot Help needed moving to and from a bed to a chair (including a wheelchair)?: A Lot Help needed standing up from a chair using your arms (e.g., wheelchair or bedside chair)?: A Lot Help needed to walk in hospital room?: A Lot Help needed climbing 3-5 steps with a railing? : Total 6 Click Score: 13    End of Session Equipment Utilized During Treatment: Oxygen Activity Tolerance: Patient tolerated treatment well;Patient limited by fatigue Patient left: in bed;with call bell/phone within reach;with bed alarm set Nurse Communication: Mobility status PT Visit Diagnosis: Unsteadiness on feet (R26.81);Other abnormalities of gait and mobility (R26.89);Muscle weakness (generalized) (M62.81)    Time: 6578-4696 PT Time Calculation (min) (ACUTE ONLY): 27 min   Charges:   PT Evaluation $PT Eval Moderate Complexity: 1 Mod           Tori Jaelon Gatley PT, DPT 07/08/20, 10:39 AM

## 2020-07-08 NOTE — Progress Notes (Signed)
PROGRESS NOTE    [name]  [id number] DOB: 01-22-1931 DOA: 07/07/2020 PCP: Tonia Ghent, MD    Brief Narrative: HPI per Dr. Posey Pronto on 07/07/2020 [name] is a 86 y.o. female with medical history significant for COPD, chronic respiratory failure with hypoxia on 3-3.5 L of home O2 via [place] at home, CAD s/p DES, hypertension, hyperlipidemia, hypothyroidism, breast cancer s/p lumpectomy on anastrozole, depression/anxiety who presents to the ED for evaluation of generalized weakness.  Patient states over the last 3 days she has been feeling generally weak with poor appetite and has not been eating much.  Earlier today she was try to get out of bed and just feels so weak that she cannot walk.  She laid down on the floor and was trying to crawl to the bathroom but felt too weak to move.  She cannot get herself back up on the bed.  She says she did not fall or injure herself.  She reports a new cough productive of clear sputum which is significantly worse than her baseline.  She reports chronic shortness of breath which she says is not really changed from her usual.  She denies any subjective fevers, chills, diaphoresis, nausea, vomiting, abdominal pain, dysuria, chest pain.  She says she did receive 2 doses of the Pfizer Covid vaccine in the past.  She says has not yet been able to get the booster shot as she is not driving on her own. ED Course:  Initial vitals showed BP 125/78, pulse 74, RR 16, temp 98.1 F, SPO2 92% on 3 L O2 via [place].  Labs show sodium 137, potassium 3.7, bicarb 32, BUN 14, creatinine 0.62, LFTs within normal limits, WBC 4.3, hemoglobin 10.7, platelets 128,000, lactic acid 1.3, D-dimer 0.94, procalcitonin <0.10.  BNP 167.8, high-sensitivity troponin I 42 > 39, CK 260.  Portable chest x-ray is negative for focal consolidation or effusion.  Streaky opacities at the left lung base are seen, similar to prior x-ray September 14, 2019.  CT head and cervical  spine without contrast are negative for skull fracture or intracranial hemorrhage.  No cervical spine fracture or subluxation seen.  Progressive moderate diffuse cerebral and cerebellar atrophy are seen.  Stable small old left cerebellar infarct is noted.  Patient was attempted to ambulate with help from nurse tech.  She was unable to stand on her own and O2 saturation dropped to 81% on standing.  Patient was started on IV fluids and the hospitalist service was consulted to admit for further evaluation and management.  Blood cultures were obtained and pending.  SARS-CoV-2 PCR is positive.  Assessment & Plan:   Principal Problem:   Acute on chronic respiratory failure with hypoxia (HCC) Active Problems:   Hypothyroidism   Hyperlipidemia with target LDL less than 70   Gold C Copd with asthmatic bronchitis component and chronic resp failure   HTN (hypertension)   Coronary artery disease involving native coronary artery of native heart with unstable angina pectoris (HCC)   History of CVA (cerebrovascular accident)   Generalized weakness   COVID-19 virus infection   #1 acute on chronic hypoxic respiratory failure secondary to COPD exacerbation/Covid bronchitis-on admission she was 81% on room air.  She is on 3 L of oxygen at home 24/7.  She was admitted with cough increasing shortness of breath. Chest x-ray did not reveal any acute findings.  CRP was low and her hypoxia improved with Decadron.  Currently she is on 2-1/2 L with saturation above  92%. Continue steroids Decadron 6 mg daily She was not started on remdesivir since she was not acutely hypoxic, CRP low, chest x-ray no infiltrates. Encourage incentive spirometer and flutter valve.  Discussed with nurse in the ER. Patient received two doses of Covid vaccine [employer].  Has not taken booster yet.  #2 history of O2 dependent COPD continue albuterol Spiriva incentive spirometer and supportive treatments.  #3 history of stroke CT head  shows old stroke and cerebral atrophy.  On Plavix and rosuvastatin.  #4 CAD-mildly elevated troponin with no acute EKG findings likely demand ischemia.  Continue Plavix and statin.  #5 generalized weakness patient was found on the floor.  She tried to get out of bed and was too weak and was found on the floor by EMS.  She denies loss of consciousness.  CT head no acute finding CT neck no acute findings x-rays of her arms no fracture.  Seen by physical therapy recommending SNF.  #6 depression anxiety on BuSpar and Cymbalta, restart Xanax.  #7 hyperlipidemia on statin  #8 hypothyroidism on Synthroid  #9 history of essential hypertension blood pressure 147/77.  She is not on any medications at home.  Will monitor.  #10 thrombocytopenia monitor on Lovenox  Estimated body mass index is 23.79 kg/m as calculated from the following:   Height as of this encounter: 5\' 2"  (1.575 m).   Weight as of this encounter: 59 kg.  DVT prophylaxis: Lovenox Code Status: DNR Family Communication: Discussed with patient's daughter Inetta Fermo Disposition Plan:  Status is: Observation  Dispo: The patient is from: Home              Anticipated d/c is to: SNF              Anticipated d/c date is: 2 days              Patient currently is not medically stable to d/c.    Consultants:   None  Procedures: None Antimicrobials: None Subjective: Patient resting in bed very pleasant sweet young lady in no acute distress.  But very tearful and anxious.  She lives alone and she was found on the floor by EMS.  She feels too weak to get out of bed.  Objective: Vitals:   07/08/20 0630 07/08/20 0801 07/08/20 0935 07/08/20 0950  BP: (!) 141/69  (!) 147/77   Pulse: 62 68 71   Resp: 20 17 (!) 22   Temp:    98.1 F (36.7 C)  TempSrc:    Oral  SpO2: 100% 96% 96%   Weight:      Height:        Intake/Output Summary (Last 24 hours) at 07/08/2020 1116 Last data filed at 07/07/2020 1915 Gross per 24 hour  Intake 1000  ml  Output --  Net 1000 ml   Filed Weights   07/07/20 1558  Weight: 59 kg    Examination:  General exam: Appears calm and comfortable  Respiratory system: Few scattered rhonchi to auscultation. Respiratory effort normal. Cardiovascular system: S1 & S2 heard, RRR. No JVD, murmurs, rubs, gallops or clicks. No pedal edema. Gastrointestinal system: Abdomen is nondistended, soft and nontender. No organomegaly or masses felt. Normal bowel sounds heard. Central nervous system: Alert and oriented. No focal neurological deficits. Extremities: No edema Skin: No rashes, lesions or ulcers Psychiatry: Judgement and insight appear normal. Mood & affect appropriate.     Data Reviewed: I have personally reviewed following labs and imaging studies  CBC: Recent Labs  Lab 07/07/20 1538 07/08/20 0430  WBC 4.3 3.1*  NEUTROABS 3.6 2.5  HGB 10.7* 10.4*  HCT 33.9* 31.6*  MCV 103.7* 102.6*  PLT 128* [id number]*   Basic Metabolic Panel: Recent Labs  Lab 07/07/20 1538 07/08/20 0430  NA 137 144  K 3.7 3.6  CL 96* 104  CO2 32 31  GLUCOSE 108* 117*  BUN 14 12  CREATININE 0.62 0.61  CALCIUM 8.8* 8.4*  MG  --  1.9  PHOS  --  3.4   GFR: Estimated Creatinine Clearance: 39.2 mL/min (by C-G formula based on SCr of 0.61 mg/dL). Liver Function Tests: Recent Labs  Lab 07/07/20 1538 07/08/20 0430  AST 40 44*  ALT 15 17  ALKPHOS 38 32*  BILITOT 0.5 0.5  PROT 7.1 6.7  ALBUMIN 3.7 3.5   No results for input(s): LIPASE, AMYLASE in the last 168 hours. No results for input(s): AMMONIA in the last 168 hours. Coagulation Profile: No results for input(s): INR, PROTIME in the last 168 hours. Cardiac Enzymes: Recent Labs  Lab 07/07/20 1538  CKTOTAL 260*   BNP (last 3 results) No results for input(s): PROBNP in the last 8760 hours. HbA1C: No results for input(s): HGBA1C in the last 72 hours. CBG: No results for input(s): GLUCAP in the last 168 hours. Lipid Profile: Recent Labs     07/07/20 1538  TRIG 37   Thyroid Function Tests: No results for input(s): TSH, T4TOTAL, FREET4, T3FREE, THYROIDAB in the last 72 hours. Anemia Panel: Recent Labs    07/07/20 1538 07/08/20 0430  FERRITIN 34 35   Sepsis Labs: Recent Labs  Lab 07/07/20 1538  PROCALCITON <0.10  LATICACIDVEN 1.3    Recent Results (from the past 240 hour(s))  Resp Panel by RT-PCR (Flu A&B, Covid) Nasopharyngeal Swab     Status: Abnormal   Collection Time: 07/07/20  3:38 PM   Specimen: Nasopharyngeal Swab; Nasopharyngeal(NP) swabs in vial transport medium  Result Value Ref Range Status   SARS Coronavirus 2 by RT PCR POSITIVE (A) NEGATIVE Final    Comment: RESULT CALLED TO, READ BACK BY AND VERIFIED WITH: M.VENTER, PA AT 1800 ON 07/07/20 BY N.THOMPSON (NOTE) SARS-CoV-2 target nucleic acids are DETECTED.  The SARS-CoV-2 RNA is generally detectable in upper respiratory specimens during the acute phase of infection. Positive results are indicative of the presence of the identified virus, but do not rule out bacterial infection or co-infection with other pathogens not detected by the test. Clinical correlation with patient history and other diagnostic information is necessary to determine patient infection status. The expected result is Negative.  Fact Sheet for Patients: EntrepreneurPulse.com.au  Fact Sheet for Healthcare Providers: IncredibleEmployment.be  This test is not yet approved or cleared by the [place] FDA and  has been authorized for detection and/or diagnosis of SARS-CoV-2 by FDA under an Emergency Use Authorization (EUA).  This EUA will remain in effect (meaning thi s test can be used) for the duration of  the COVID-19 declaration under Section 564(b)(1) of the Act, 21 U.S.C. section 360bbb-3(b)(1), unless the authorization is terminated or revoked sooner.     Influenza A by PCR NEGATIVE NEGATIVE Final   Influenza B by PCR NEGATIVE  NEGATIVE Final    Comment: (NOTE) The Xpert Xpress SARS-CoV-2/FLU/RSV plus assay is intended as an aid in the diagnosis of influenza from Nasopharyngeal swab specimens and should not be used as a sole basis for treatment. Nasal washings and aspirates are unacceptable for Xpert Xpress SARS-CoV-2/FLU/RSV testing.  Fact Sheet  for Patients: BloggerCourse.com  Fact Sheet for Healthcare Providers: SeriousBroker.it  This test is not yet approved or cleared by the [place] FDA and has been authorized for detection and/or diagnosis of SARS-CoV-2 by FDA under an Emergency Use Authorization (EUA). This EUA will remain in effect (meaning this test can be used) for the duration of the COVID-19 declaration under Section 564(b)(1) of the Act, 21 U.S.C. section 360bbb-3(b)(1), unless the authorization is terminated or revoked.  Performed at [facility], [address] [place], [place] [address]   Blood Culture (routine x 2)     Status: None (Preliminary result)   Collection Time: 07/07/20  3:38 PM   Specimen: BLOOD LEFT FOREARM  Result Value Ref Range Status   Specimen Description   Final    BLOOD LEFT FOREARM Performed at [facility], [address] [place], [place] [address]    Special Requests   Final    BOTTLES DRAWN AEROBIC AND ANAEROBIC Blood Culture adequate volume Performed at [facility], [address] [place], [place] [address]    Culture   Final    NO GROWTH < 12 HOURS Performed at [facility], [address] [place], [place] [address]    Report Status PENDING  Incomplete  Blood Culture (routine x 2)     Status: None (Preliminary result)   Collection Time: 07/07/20  3:38 PM   Specimen: BLOOD RIGHT FOREARM  Result Value Ref Range Status   Specimen Description   Final    BLOOD RIGHT FOREARM Performed at [facility], [address] [place], [place] [address]    Special Requests   Final    BOTTLES DRAWN AEROBIC AND ANAEROBIC Blood Culture adequate volume Performed at [facility], [address] [place], [place] [address]    Culture   Final    NO GROWTH < 12 HOURS Performed at [facility], [address] [place], [place] [address]    Report Status PENDING  Incomplete         Radiology Studies: DG Forearm Right  Result Date: 07/08/2020 CLINICAL DATA:  Right arm pain EXAM: RIGHT FOREARM - 2 VIEW COMPARISON:  None. FINDINGS: There is no evidence of fracture or other focal bone lesions. Soft tissues are unremarkable. IMPRESSION: Negative. Electronically Signed   By: Helyn Numbers MD   On: 07/08/2020 00:57   CT Head Wo Contrast  Result Date: 07/07/2020 CLINICAL DATA:  Found on the floor. EXAM: CT HEAD WITHOUT CONTRAST CT CERVICAL SPINE WITHOUT CONTRAST TECHNIQUE: Multidetector CT imaging of the head and cervical spine was performed following the standard protocol without intravenous contrast. Multiplanar CT image reconstructions of the cervical spine were also generated. COMPARISON:  01/23/2017 FINDINGS: CT HEAD FINDINGS Brain: Moderately enlarged ventricles and subarachnoid spaces with mild progression. Stable left lateral frontal subdural hygroma or arachnoid cyst. Marked patchy white matter low density in both cerebral hemispheres with progression. Stable small, old left cerebellar infarct. No intracranial hemorrhage, mass lesion or CT evidence of acute infarction. Vascular: No hyperdense vessel or unexpected calcification. Skull: Normal. Negative for fracture or focal lesion. Sinuses/Orbits: Marked bilateral frontal and ethmoid sinus mucosal thickening with progression. Small amount of sphenoid sinus fluid. Status post bilateral cataract extraction. Other: None. CT CERVICAL SPINE FINDINGS Alignment: Stable straightening of the normal cervical lordosis and dextroconvex cervicothoracic  scoliosis. No subluxations. Skull base and vertebrae: No acute fracture. No primary bone lesion or focal pathologic  process. Soft tissues and spinal canal: No prevertebral fluid or swelling. No visible canal hematoma. Disc levels: Bilateral facet degenerative changes throughout the cervical spine. Upper chest: Clear lung apices. Other: Bilateral carotid artery atheromatous calcifications. IMPRESSION: 1. No skull fracture or intracranial hemorrhage. 2. No cervical spine fracture or subluxation. 3. Progressive moderate diffuse cerebral and cerebellar atrophy. 4. Marked chronic small vessel white matter ischemic changes in both cerebral hemispheres with progression. 5. Stable small, old left cerebellar infarct. 6. Stable left lateral frontal subdural hygroma or arachnoid cyst. 7. Progressive marked chronic bilateral frontal sinusitis with possible acute sphenoid sinusitis. 8. Bilateral carotid artery atheromatous calcifications. 9. Bilateral facet degenerative changes throughout the cervical spine. Electronically Signed   By: Claudie Revering M.D.   On: 07/07/2020 16:43   CT Cervical Spine Wo Contrast  Result Date: 07/07/2020 CLINICAL DATA:  Found on the floor. EXAM: CT HEAD WITHOUT CONTRAST CT CERVICAL SPINE WITHOUT CONTRAST TECHNIQUE: Multidetector CT imaging of the head and cervical spine was performed following the standard protocol without intravenous contrast. Multiplanar CT image reconstructions of the cervical spine were also generated. COMPARISON:  01/23/2017 FINDINGS: CT HEAD FINDINGS Brain: Moderately enlarged ventricles and subarachnoid spaces with mild progression. Stable left lateral frontal subdural hygroma or arachnoid cyst. Marked patchy white matter low density in both cerebral hemispheres with progression. Stable small, old left cerebellar infarct. No intracranial hemorrhage, mass lesion or CT evidence of acute infarction. Vascular: No hyperdense vessel or unexpected calcification. Skull: Normal.  Negative for fracture or focal lesion. Sinuses/Orbits: Marked bilateral frontal and ethmoid sinus mucosal thickening with progression. Small amount of sphenoid sinus fluid. Status post bilateral cataract extraction. Other: None. CT CERVICAL SPINE FINDINGS Alignment: Stable straightening of the normal cervical lordosis and dextroconvex cervicothoracic scoliosis. No subluxations. Skull base and vertebrae: No acute fracture. No primary bone lesion or focal pathologic process. Soft tissues and spinal canal: No prevertebral fluid or swelling. No visible canal hematoma. Disc levels: Bilateral facet degenerative changes throughout the cervical spine. Upper chest: Clear lung apices. Other: Bilateral carotid artery atheromatous calcifications. IMPRESSION: 1. No skull fracture or intracranial hemorrhage. 2. No cervical spine fracture or subluxation. 3. Progressive moderate diffuse cerebral and cerebellar atrophy. 4. Marked chronic small vessel white matter ischemic changes in both cerebral hemispheres with progression. 5. Stable small, old left cerebellar infarct. 6. Stable left lateral frontal subdural hygroma or arachnoid cyst. 7. Progressive marked chronic bilateral frontal sinusitis with possible acute sphenoid sinusitis. 8. Bilateral carotid artery atheromatous calcifications. 9. Bilateral facet degenerative changes throughout the cervical spine. Electronically Signed   By: Claudie Revering M.D.   On: 07/07/2020 16:43   DG Chest Port 1 View  Result Date: 07/07/2020 CLINICAL DATA:  Mental status change.  Cough. EXAM: PORTABLE CHEST 1 VIEW COMPARISON:  September 14, 2019. FINDINGS: Cardiomediastinal silhouette is accentuated by AP portable technique. No new consolidation. Similar streaky opacities at the left lung base, likely atelectasis/scar. No visible pleural effusions or pneumothorax on this supine radiograph. No acute osseous abnormality. IMPRESSION: No evidence of acute cardiopulmonary disease. Electronically Signed    By: Margaretha Sheffield MD   On: 07/07/2020 15:32   DG Humerus Right  Result Date: 07/08/2020 CLINICAL DATA:  Right arm pain EXAM: RIGHT HUMERUS - 2+ VIEW COMPARISON:  None. FINDINGS: Two view radiograph right humerus demonstrates surgical changes of right total shoulder arthroplasty. No acute fracture or dislocation. No lytic or blastic bone lesion. Soft tissues are unremarkable. IMPRESSION: Negative. Electronically Signed   By: Fidela Salisbury MD  On: 07/08/2020 00:58        Scheduled Meds: . anastrozole  1 mg Oral Daily  . vitamin C  500 mg Oral Daily  . busPIRone  5 mg Oral BID  . clopidogrel  75 mg Oral Daily  . dexamethasone  6 mg Oral Q24H  . DULoxetine  60 mg Oral Daily  . enoxaparin (LOVENOX) injection  40 mg Subcutaneous Q24H  . fluticasone  2 spray Each Nare Daily  . levothyroxine  112 mcg Oral Q0600  . pantoprazole  20 mg Oral BID  . rosuvastatin  10 mg Oral Daily  . umeclidinium bromide  1 puff Inhalation Daily  . zinc sulfate  220 mg Oral Daily   Continuous Infusions:   LOS: 0 days   Georgette Shell, MD  07/08/2020, 11:16 AM

## 2020-07-09 DIAGNOSIS — J9621 Acute and chronic respiratory failure with hypoxia: Secondary | ICD-10-CM | POA: Diagnosis not present

## 2020-07-09 LAB — COMPREHENSIVE METABOLIC PANEL
ALT: 20 U/L (ref 0–44)
AST: 52 U/L — ABNORMAL HIGH (ref 15–41)
Albumin: 3.5 g/dL (ref 3.5–5.0)
Alkaline Phosphatase: 33 U/L — ABNORMAL LOW (ref 38–126)
Anion gap: 9 (ref 5–15)
BUN: 16 mg/dL (ref 8–23)
CO2: 32 mmol/L (ref 22–32)
Calcium: 8.9 mg/dL (ref 8.9–10.3)
Chloride: 99 mmol/L (ref 98–111)
Creatinine, Ser: 0.5 mg/dL (ref 0.44–1.00)
GFR, Estimated: >60 mL/min (ref >60)
Glucose, Bld: 140 mg/dL — ABNORMAL HIGH (ref 70–99)
Potassium: 3.6 mmol/L (ref 3.5–5.1)
Sodium: 140 mmol/L (ref 135–145)
Total Bilirubin: 0.5 mg/dL (ref 0.3–1.2)
Total Protein: 6.7 g/dL (ref 6.5–8.1)

## 2020-07-09 LAB — CBC WITH DIFFERENTIAL/PLATELET
Abs Immature Granulocytes: 0 10*3/uL (ref 0.00–0.07)
Basophils Absolute: 0 10*3/uL (ref 0.0–0.1)
Basophils Relative: 0 %
Eosinophils Absolute: 0 10*3/uL (ref 0.0–0.5)
Eosinophils Relative: 0 %
HCT: 30.2 % — ABNORMAL LOW (ref 36.0–46.0)
Hemoglobin: 9.6 g/dL — ABNORMAL LOW (ref 12.0–15.0)
Immature Granulocytes: 0 %
Lymphocytes Relative: 19 %
Lymphs Abs: 0.6 10*3/uL — ABNORMAL LOW (ref 0.7–4.0)
MCH: 32.3 pg (ref 26.0–34.0)
MCHC: 31.8 g/dL (ref 30.0–36.0)
MCV: 101.7 fL — ABNORMAL HIGH (ref 80.0–100.0)
Monocytes Absolute: 0.2 10*3/uL (ref 0.1–1.0)
Monocytes Relative: 6 %
Neutro Abs: 2.2 10*3/uL (ref 1.7–7.7)
Neutrophils Relative %: 75 %
Platelets: 120 10*3/uL — ABNORMAL LOW (ref 150–400)
RBC: 2.97 MIL/uL — ABNORMAL LOW (ref 3.87–5.11)
RDW: 13.4 % (ref 11.5–15.5)
WBC: 2.9 10*3/uL — ABNORMAL LOW (ref 4.0–10.5)
nRBC: 0 % (ref 0.0–0.2)

## 2020-07-09 LAB — MAGNESIUM: Magnesium: 1.9 mg/dL (ref 1.7–2.4)

## 2020-07-09 LAB — C-REACTIVE PROTEIN: CRP: 0.7 mg/dL (ref <1.0)

## 2020-07-09 LAB — PHOSPHORUS: Phosphorus: 3.6 mg/dL (ref 2.5–4.6)

## 2020-07-09 LAB — D-DIMER, QUANTITATIVE: D-Dimer, Quant: 0.68 ug/mL-FEU — ABNORMAL HIGH (ref 0.00–0.50)

## 2020-07-09 LAB — FERRITIN: Ferritin: 32 ng/mL (ref 11–307)

## 2020-07-09 MED ORDER — ALPRAZOLAM 0.5 MG PO TABS
0.5000 mg | ORAL_TABLET | Freq: Three times a day (TID) | ORAL | Status: DC | PRN
  Administered 2020-07-09 – 2020-07-11 (×4): 0.5 mg via ORAL
  Filled 2020-07-09 (×5): qty 1

## 2020-07-09 MED ORDER — FERROUS SULFATE 325 (65 FE) MG PO TABS
325.0000 mg | ORAL_TABLET | Freq: Two times a day (BID) | ORAL | Status: DC
  Administered 2020-07-09 – 2020-07-11 (×4): 325 mg via ORAL
  Filled 2020-07-09 (×4): qty 1

## 2020-07-09 NOTE — Progress Notes (Addendum)
PROGRESS NOTE    [name]  [id number] DOB: [birth year] DOA: 07/07/2020 PCP: Tonia Ghent, MD    Brief Narrative: HPI per Dr. Posey Pronto on 07/07/2020 [name] is a 87 y.o. female with medical history significant for COPD, chronic respiratory failure with hypoxia on 3-3.5 L of home O2 via [place] at home, CAD s/p DES, hypertension, hyperlipidemia, hypothyroidism, breast cancer s/p lumpectomy on anastrozole, depression/anxiety who presents to the ED for evaluation of generalized weakness.  Patient states over the last 3 days she has been feeling generally weak with poor appetite and has not been eating much.  Earlier today she was try to get out of bed and just feels so weak that she cannot walk.  She laid down on the floor and was trying to crawl to the bathroom but felt too weak to move.  She cannot get herself back up on the bed.  She says she did not fall or injure herself.  She reports a new cough productive of clear sputum which is significantly worse than her baseline.  She reports chronic shortness of breath which she says is not really changed from her usual.  She denies any subjective fevers, chills, diaphoresis, nausea, vomiting, abdominal pain, dysuria, chest pain.  She says she did receive 2 doses of the Pfizer Covid vaccine in the past.  She says has not yet been able to get the booster shot as she is not driving on her own. ED Course:  Initial vitals showed BP 125/78, pulse 74, RR 16, temp 98.1 F, SPO2 92% on 3 L O2 via [place].  Labs show sodium 137, potassium 3.7, bicarb 32, BUN 14, creatinine 0.62, LFTs within normal limits, WBC 4.3, hemoglobin 10.7, platelets 128,000, lactic acid 1.3, D-dimer 0.94, procalcitonin <0.10.  BNP 167.8, high-sensitivity troponin I 42 > 39, CK 260.  Portable chest x-ray is negative for focal consolidation or effusion.  Streaky opacities at the left lung base are seen, similar to prior x-ray September 14, 2019.  CT head and cervical  spine without contrast are negative for skull fracture or intracranial hemorrhage.  No cervical spine fracture or subluxation seen.  Progressive moderate diffuse cerebral and cerebellar atrophy are seen.  Stable small old left cerebellar infarct is noted.  Patient was attempted to ambulate with help from nurse tech.  She was unable to stand on her own and O2 saturation dropped to 81% on standing.  Patient was started on IV fluids and the hospitalist service was consulted to admit for further evaluation and management.  Blood cultures were obtained and pending.  SARS-CoV-2 PCR is positive.  Assessment & Plan:   Principal Problem:   Acute on chronic respiratory failure with hypoxia (HCC) Active Problems:   Hypothyroidism   Hyperlipidemia with target LDL less than 70   Gold C Copd with asthmatic bronchitis component and chronic resp failure   HTN (hypertension)   Coronary artery disease involving native coronary artery of native heart with unstable angina pectoris (HCC)   History of CVA (cerebrovascular accident)   Generalized weakness   COVID-19 virus infection   #1 acute on chronic hypoxic respiratory failure secondary to COPD exacerbation/Covid bronchitis-on admission she was 81% on room air.  She is on 3 L of oxygen at home 24/7.  She was admitted with cough increasing shortness of breath. Chest x-ray did not reveal any acute findings.  CRP was low and her hypoxia improved with Decadron.  Currently she is on 2-1/2 L with saturation above  92%. Continue steroids Decadron 6 mg daily She was not started on remdesivir since she was not acutely hypoxic, CRP low, chest x-ray no infiltrates. Encourage incentive spirometer and flutter valve.  Discussed with nurse in the ER. Patient received two doses of Covid vaccine [employer].  Has not taken booster yet.  #2 history of O2 dependent COPD continue albuterol Spiriva incentive spirometer and supportive treatments.  #3 history of stroke CT head  shows old stroke and cerebral atrophy.  On Plavix and rosuvastatin.  #4 CAD-mildly elevated troponin with no acute EKG findings likely demand ischemia.  Continue Plavix and statin.  #5 generalized weakness patient was found on the floor.  She tried to get out of bed and was too weak and was found on the floor by EMS.  She denies loss of consciousness.  CT head no acute finding CT neck no acute findings x-rays of her arms no fracture.  Seen by physical therapy recommending SNF.  #6 depression anxiety on BuSpar and Cymbalta, restart Xanax.  #7 hyperlipidemia on statin  #8 hypothyroidism on Synthroid  #9 history of essential hypertension blood pressure 147/77.  She is not on any medications at home.  Will monitor.  #10 thrombocytopenia monitor on Lovenox  #11 anemia likely secondary to chronic disease, macrocytic and with low ferritin-check anemia panel B12 and folate and FOBT.  Her hemoglobin dropped to 9.6 from 10.4 this is likely hemodilution from IV fluids.  Increase ferrous sulfate to twice a day continue iron supplementation with vitamin C.  Estimated body mass index is 23.79 kg/m as calculated from the following:   Height as of this encounter: 5\' 2"  (1.575 m).   Weight as of this encounter: 59 kg.  DVT prophylaxis: Lovenox Code Status: DNR Family Communication: Discussed with patient's daughter Tina/please update her daughter daily Disposition Plan:  Status is: Inpatient Dispo: The patient is from: Home              Anticipated d/c is to: SNF              Anticipated d/c date is: 2 days              Patient currently is not medically stable to d/c.    Consultants:   None  Procedures: None Antimicrobials: None Subjective: She is resting in bed awake alert Has generalized weakness Unable to get out of bed by herself Breathing better Objective: Vitals:   07/09/20 0113 07/09/20 0548 07/09/20 1402 07/09/20 1404  BP: 131/64 134/69  (!) 147/70  Pulse: 64 60 (!) 57 (!) 58   Resp: 20 19  16   Temp: 98.1 F (36.7 C) 98.9 F (37.2 C)  98.1 F (36.7 C)  TempSrc: Oral Oral    SpO2: 97% 96% 98% 97%  Weight:      Height:        Intake/Output Summary (Last 24 hours) at 07/09/2020 1430 Last data filed at 07/09/2020 1022 Gross per 24 hour  Intake 716 ml  Output 1800 ml  Net -1084 ml   Filed Weights   07/07/20 1558  Weight: 59 kg    Examination:  General exam: Appears calm and comfortable  Respiratory system: Few scattered rhonchi to auscultation. Respiratory effort normal. Cardiovascular system: S1 & S2 heard, RRR. No JVD, murmurs, rubs, gallops or clicks. No pedal edema. Gastrointestinal system: Abdomen is nondistended, soft and nontender. No organomegaly or masses felt. Normal bowel sounds heard. Central nervous system: Alert and oriented. No focal neurological deficits. Extremities: No  edema Skin: No rashes, lesions or ulcers Psychiatry: Judgement and insight appear normal. Mood & affect appropriate.     Data Reviewed: I have personally reviewed following labs and imaging studies  CBC: Recent Labs  Lab 07/07/20 1538 07/08/20 0430 07/09/20 0557  WBC 4.3 3.1* 2.9*  NEUTROABS 3.6 2.5 2.2  HGB 10.7* 10.4* 9.6*  HCT 33.9* 31.6* 30.2*  MCV 103.7* 102.6* 101.7*  PLT 128* 131* [id number]*   Basic Metabolic Panel: Recent Labs  Lab 07/07/20 1538 07/08/20 0430 07/09/20 0557  NA 137 144 140  K 3.7 3.6 3.6  CL 96* 104 99  CO2 32 31 32  GLUCOSE 108* 117* 140*  BUN 14 12 16   CREATININE 0.62 0.61 0.50  CALCIUM 8.8* 8.4* 8.9  MG  --  1.9 1.9  PHOS  --  3.4 3.6   GFR: Estimated Creatinine Clearance: 39.2 mL/min (by C-G formula based on SCr of 0.5 mg/dL). Liver Function Tests: Recent Labs  Lab 07/07/20 1538 07/08/20 0430 07/09/20 0557  AST 40 44* 52*  ALT 15 17 20   ALKPHOS 38 32* 33*  BILITOT 0.5 0.5 0.5  PROT 7.1 6.7 6.7  ALBUMIN 3.7 3.5 3.5   No results for input(s): LIPASE, AMYLASE in the last 168 hours. No results for input(s):  AMMONIA in the last 168 hours. Coagulation Profile: No results for input(s): INR, PROTIME in the last 168 hours. Cardiac Enzymes: Recent Labs  Lab 07/07/20 1538  CKTOTAL 260*   BNP (last 3 results) No results for input(s): PROBNP in the last 8760 hours. HbA1C: No results for input(s): HGBA1C in the last 72 hours. CBG: No results for input(s): GLUCAP in the last 168 hours. Lipid Profile: Recent Labs    07/07/20 1538  TRIG 37   Thyroid Function Tests: No results for input(s): TSH, T4TOTAL, FREET4, T3FREE, THYROIDAB in the last 72 hours. Anemia Panel: Recent Labs    07/08/20 0430 07/09/20 0557  FERRITIN 35 32   Sepsis Labs: Recent Labs  Lab 07/07/20 1538  PROCALCITON <0.10  LATICACIDVEN 1.3    Recent Results (from the past 240 hour(s))  Resp Panel by RT-PCR (Flu A&B, Covid) Nasopharyngeal Swab     Status: Abnormal   Collection Time: 07/07/20  3:38 PM   Specimen: Nasopharyngeal Swab; Nasopharyngeal(NP) swabs in vial transport medium  Result Value Ref Range Status   SARS Coronavirus 2 by RT PCR POSITIVE (A) NEGATIVE Final    Comment: RESULT CALLED TO, READ BACK BY AND VERIFIED WITH: M.VENTER, PA AT 1800 ON 07/07/20 BY N.THOMPSON (NOTE) SARS-CoV-2 target nucleic acids are DETECTED.  The SARS-CoV-2 RNA is generally detectable in upper respiratory specimens during the acute phase of infection. Positive results are indicative of the presence of the identified virus, but do not rule out bacterial infection or co-infection with other pathogens not detected by the test. Clinical correlation with patient history and other diagnostic information is necessary to determine patient infection status. The expected result is Negative.  Fact Sheet for Patients: EntrepreneurPulse.com.au  Fact Sheet for Healthcare Providers: IncredibleEmployment.be  This test is not yet approved or cleared by the [place] FDA and  has been authorized  for detection and/or diagnosis of SARS-CoV-2 by FDA under an Emergency Use Authorization (EUA).  This EUA will remain in effect (meaning thi s test can be used) for the duration of  the COVID-19 declaration under Section 564(b)(1) of the Act, 21 U.S.C. section 360bbb-3(b)(1), unless the authorization is terminated or revoked sooner.     Influenza  A by PCR NEGATIVE NEGATIVE Final   Influenza B by PCR NEGATIVE NEGATIVE Final    Comment: (NOTE) The Xpert Xpress SARS-CoV-2/FLU/RSV plus assay is intended as an aid in the diagnosis of influenza from Nasopharyngeal swab specimens and should not be used as a sole basis for treatment. Nasal washings and aspirates are unacceptable for Xpert Xpress SARS-CoV-2/FLU/RSV testing.  Fact Sheet for Patients: EntrepreneurPulse.com.au  Fact Sheet for Healthcare Providers: IncredibleEmployment.be  This test is not yet approved or cleared by the [place] FDA and has been authorized for detection and/or diagnosis of SARS-CoV-2 by FDA under an Emergency Use Authorization (EUA). This EUA will remain in effect (meaning this test can be used) for the duration of the COVID-19 declaration under Section 564(b)(1) of the Act, 21 U.S.C. section 360bbb-3(b)(1), unless the authorization is terminated or revoked.  Performed at [facility], [place] [address], [place], [place] [address]   Blood Culture (routine x 2)     Status: None (Preliminary result)   Collection Time: 07/07/20  3:38 PM   Specimen: BLOOD LEFT FOREARM  Result Value Ref Range Status   Specimen Description   Final    BLOOD LEFT FOREARM Performed at [facility] [address], [place], [place] [address]    Special Requests   Final    BOTTLES DRAWN AEROBIC AND ANAEROBIC Blood Culture adequate volume Performed at [facility] [address] [place], [place] [address]    Culture   Final    NO  GROWTH 2 DAYS Performed at [facility] [address], [place], [place] [address]    Report Status PENDING  Incomplete  Blood Culture (routine x 2)     Status: None (Preliminary result)   Collection Time: 07/07/20  3:38 PM   Specimen: BLOOD RIGHT FOREARM  Result Value Ref Range Status   Specimen Description   Final    BLOOD RIGHT FOREARM Performed at [facility] [address], [place], [place] [address]    Special Requests   Final    BOTTLES DRAWN AEROBIC AND ANAEROBIC Blood Culture adequate volume Performed at [facility] [address], [place], [place] [address]    Culture   Final    NO GROWTH 2 DAYS Performed at [facility] [address], [place], [place] [address]    Report Status PENDING  Incomplete         Radiology Studies: DG Forearm Right  Result Date: 07/08/2020 CLINICAL DATA:  Right arm pain EXAM: RIGHT FOREARM - 2 VIEW COMPARISON:  None. FINDINGS: There is no evidence of fracture or other focal bone lesions. Soft tissues are unremarkable. IMPRESSION: Negative. Electronically Signed   By: Fidela Salisbury MD   On: 07/08/2020 00:57   CT Head Wo Contrast  Result Date: 07/07/2020 CLINICAL DATA:  Found on the floor. EXAM: CT HEAD WITHOUT CONTRAST CT CERVICAL SPINE WITHOUT CONTRAST TECHNIQUE: Multidetector CT imaging of the head and cervical spine was performed following the standard protocol without intravenous contrast. Multiplanar CT image reconstructions of the cervical spine were also generated. COMPARISON:  01/23/2017 FINDINGS: CT HEAD FINDINGS Brain: Moderately enlarged ventricles and subarachnoid spaces with mild progression. Stable left lateral frontal subdural hygroma or arachnoid cyst. Marked patchy white matter low density in both cerebral hemispheres with progression. Stable small, old left cerebellar infarct. No intracranial hemorrhage, mass lesion or CT evidence of acute infarction. Vascular: No  hyperdense vessel or unexpected calcification. Skull:  Normal. Negative for fracture or focal lesion. Sinuses/Orbits: Marked bilateral frontal and ethmoid sinus mucosal thickening with progression. Small amount of sphenoid sinus fluid. Status post bilateral cataract extraction. Other: None. CT CERVICAL SPINE FINDINGS Alignment: Stable straightening of the normal cervical lordosis and dextroconvex cervicothoracic scoliosis. No subluxations. Skull base and vertebrae: No acute fracture. No primary bone lesion or focal pathologic process. Soft tissues and spinal canal: No prevertebral fluid or swelling. No visible canal hematoma. Disc levels: Bilateral facet degenerative changes throughout the cervical spine. Upper chest: Clear lung apices. Other: Bilateral carotid artery atheromatous calcifications. IMPRESSION: 1. No skull fracture or intracranial hemorrhage. 2. No cervical spine fracture or subluxation. 3. Progressive moderate diffuse cerebral and cerebellar atrophy. 4. Marked chronic small vessel white matter ischemic changes in both cerebral hemispheres with progression. 5. Stable small, old left cerebellar infarct. 6. Stable left lateral frontal subdural hygroma or arachnoid cyst. 7. Progressive marked chronic bilateral frontal sinusitis with possible acute sphenoid sinusitis. 8. Bilateral carotid artery atheromatous calcifications. 9. Bilateral facet degenerative changes throughout the cervical spine. Electronically Signed   By: Claudie Revering M.D.   On: 07/07/2020 16:43   CT Cervical Spine Wo Contrast  Result Date: 07/07/2020 CLINICAL DATA:  Found on the floor. EXAM: CT HEAD WITHOUT CONTRAST CT CERVICAL SPINE WITHOUT CONTRAST TECHNIQUE: Multidetector CT imaging of the head and cervical spine was performed following the standard protocol without intravenous contrast. Multiplanar CT image reconstructions of the cervical spine were also generated. COMPARISON:  01/23/2017 FINDINGS: CT HEAD FINDINGS Brain:  Moderately enlarged ventricles and subarachnoid spaces with mild progression. Stable left lateral frontal subdural hygroma or arachnoid cyst. Marked patchy white matter low density in both cerebral hemispheres with progression. Stable small, old left cerebellar infarct. No intracranial hemorrhage, mass lesion or CT evidence of acute infarction. Vascular: No hyperdense vessel or unexpected calcification. Skull: Normal. Negative for fracture or focal lesion. Sinuses/Orbits: Marked bilateral frontal and ethmoid sinus mucosal thickening with progression. Small amount of sphenoid sinus fluid. Status post bilateral cataract extraction. Other: None. CT CERVICAL SPINE FINDINGS Alignment: Stable straightening of the normal cervical lordosis and dextroconvex cervicothoracic scoliosis. No subluxations. Skull base and vertebrae: No acute fracture. No primary bone lesion or focal pathologic process. Soft tissues and spinal canal: No prevertebral fluid or swelling. No visible canal hematoma. Disc levels: Bilateral facet degenerative changes throughout the cervical spine. Upper chest: Clear lung apices. Other: Bilateral carotid artery atheromatous calcifications. IMPRESSION: 1. No skull fracture or intracranial hemorrhage. 2. No cervical spine fracture or subluxation. 3. Progressive moderate diffuse cerebral and cerebellar atrophy. 4. Marked chronic small vessel white matter ischemic changes in both cerebral hemispheres with progression. 5. Stable small, old left cerebellar infarct. 6. Stable left lateral frontal subdural hygroma or arachnoid cyst. 7. Progressive marked chronic bilateral frontal sinusitis with possible acute sphenoid sinusitis. 8. Bilateral carotid artery atheromatous calcifications. 9. Bilateral facet degenerative changes throughout the cervical spine. Electronically Signed   By: Claudie Revering M.D.   On: 07/07/2020 16:43   DG Chest Port 1 View  Result Date: 07/07/2020 CLINICAL DATA:  Mental status change.   Cough. EXAM: PORTABLE CHEST 1 VIEW COMPARISON:  September 14, 2019. FINDINGS: Cardiomediastinal silhouette is accentuated by AP portable technique. No new consolidation. Similar streaky opacities at the left lung base, likely atelectasis/scar. No visible pleural effusions or pneumothorax on this supine radiograph. No acute osseous abnormality. IMPRESSION: No evidence of acute cardiopulmonary disease. Electronically Signed   By: Margaretha Sheffield MD   On: 07/07/2020 15:32  DG Humerus Right  Result Date: 07/08/2020 CLINICAL DATA:  Right arm pain EXAM: RIGHT HUMERUS - 2+ VIEW COMPARISON:  None. FINDINGS: Two view radiograph right humerus demonstrates surgical changes of right total shoulder arthroplasty. No acute fracture or dislocation. No lytic or blastic bone lesion. Soft tissues are unremarkable. IMPRESSION: Negative. Electronically Signed   By: Fidela Salisbury MD   On: 07/08/2020 00:58        Scheduled Meds: . anastrozole  1 mg Oral Daily  . vitamin C  500 mg Oral Daily  . busPIRone  5 mg Oral BID  . cholecalciferol  1,000 Units Oral Daily  . clopidogrel  75 mg Oral Daily  . dexamethasone  6 mg Oral Q24H  . DULoxetine  60 mg Oral Daily  . enoxaparin (LOVENOX) injection  40 mg Subcutaneous Q24H  . fluticasone  2 spray Each Nare Daily  . levothyroxine  112 mcg Oral Q0600  . pantoprazole  20 mg Oral BID  . rosuvastatin  10 mg Oral Daily  . traZODone  50 mg Oral QHS  . umeclidinium bromide  1 puff Inhalation Daily  . zinc sulfate  220 mg Oral Daily   Continuous Infusions:   LOS: 1 day   Georgette Shell, MD  07/09/2020, 2:30 PM

## 2020-07-09 NOTE — Plan of Care (Signed)
  Problem: Education: Goal: Knowledge of General Education information will improve Description: Including pain rating scale, medication(s)/side effects and non-pharmacologic comfort measures Outcome: Progressing   Problem: Health Behavior/Discharge Planning: Goal: Ability to manage health-related needs will improve Outcome: Progressing   Problem: Activity: Goal: Risk for activity intolerance will decrease Outcome: Progressing   Problem: Nutrition: Goal: Adequate nutrition will be maintained Outcome: Progressing   Problem: Safety: Goal: Ability to remain free from injury will improve Outcome: Progressing   Problem: Skin Integrity: Goal: Risk for impaired skin integrity will decrease Outcome: Progressing   Problem: Coping: Goal: Psychosocial and spiritual needs will be supported Outcome: Progressing

## 2020-07-10 DIAGNOSIS — J9621 Acute and chronic respiratory failure with hypoxia: Secondary | ICD-10-CM | POA: Diagnosis not present

## 2020-07-10 LAB — COMPREHENSIVE METABOLIC PANEL
ALT: 20 U/L (ref 0–44)
AST: 64 U/L — ABNORMAL HIGH (ref 15–41)
Albumin: 3.4 g/dL — ABNORMAL LOW (ref 3.5–5.0)
Alkaline Phosphatase: 31 U/L — ABNORMAL LOW (ref 38–126)
Anion gap: 11 (ref 5–15)
BUN: 15 mg/dL (ref 8–23)
CO2: 31 mmol/L (ref 22–32)
Calcium: 8.7 mg/dL — ABNORMAL LOW (ref 8.9–10.3)
Chloride: 98 mmol/L (ref 98–111)
Creatinine, Ser: 0.67 mg/dL (ref 0.44–1.00)
GFR, Estimated: >60 mL/min (ref >60)
Glucose, Bld: 131 mg/dL — ABNORMAL HIGH (ref 70–99)
Potassium: 3.8 mmol/L (ref 3.5–5.1)
Sodium: 140 mmol/L (ref 135–145)
Total Bilirubin: 0.5 mg/dL (ref 0.3–1.2)
Total Protein: 6.5 g/dL (ref 6.5–8.1)

## 2020-07-10 LAB — CBC WITH DIFFERENTIAL/PLATELET
Abs Immature Granulocytes: 0.02 10*3/uL (ref 0.00–0.07)
Basophils Absolute: 0 10*3/uL (ref 0.0–0.1)
Basophils Relative: 0 %
Eosinophils Absolute: 0 10*3/uL (ref 0.0–0.5)
Eosinophils Relative: 0 %
HCT: 30 % — ABNORMAL LOW (ref 36.0–46.0)
Hemoglobin: 9.5 g/dL — ABNORMAL LOW (ref 12.0–15.0)
Immature Granulocytes: 0 %
Lymphocytes Relative: 8 %
Lymphs Abs: 0.4 10*3/uL — ABNORMAL LOW (ref 0.7–4.0)
MCH: 32.1 pg (ref 26.0–34.0)
MCHC: 31.7 g/dL (ref 30.0–36.0)
MCV: 101.4 fL — ABNORMAL HIGH (ref 80.0–100.0)
Monocytes Absolute: 0.2 10*3/uL (ref 0.1–1.0)
Monocytes Relative: 3 %
Neutro Abs: 4.8 10*3/uL (ref 1.7–7.7)
Neutrophils Relative %: 89 %
Platelets: 140 10*3/uL — ABNORMAL LOW (ref 150–400)
RBC: 2.96 MIL/uL — ABNORMAL LOW (ref 3.87–5.11)
RDW: 13.6 % (ref 11.5–15.5)
WBC: 5.5 10*3/uL (ref 4.0–10.5)
nRBC: 0 % (ref 0.0–0.2)

## 2020-07-10 LAB — VITAMIN B12: Vitamin B-12: 239 pg/mL (ref 180–914)

## 2020-07-10 LAB — IRON AND TIBC
Iron: 56 ug/dL (ref 28–170)
Saturation Ratios: 19 % (ref 10.4–31.8)
TIBC: 297 ug/dL (ref 250–450)
UIBC: 241 ug/dL

## 2020-07-10 LAB — D-DIMER, QUANTITATIVE: D-Dimer, Quant: 1.27 ug/mL-FEU — ABNORMAL HIGH (ref 0.00–0.50)

## 2020-07-10 LAB — FOLATE: Folate: 18.6 ng/mL (ref >5.9)

## 2020-07-10 LAB — MAGNESIUM: Magnesium: 1.8 mg/dL (ref 1.7–2.4)

## 2020-07-10 LAB — C-REACTIVE PROTEIN: CRP: 0.5 mg/dL (ref <1.0)

## 2020-07-10 LAB — PHOSPHORUS: Phosphorus: 3.4 mg/dL (ref 2.5–4.6)

## 2020-07-10 LAB — FERRITIN: Ferritin: 33 ng/mL (ref 11–307)

## 2020-07-10 MED ORDER — TRAMADOL HCL 50 MG PO TABS
50.0000 mg | ORAL_TABLET | Freq: Once | ORAL | Status: AC
Start: 2020-07-10 — End: 2020-07-10
  Administered 2020-07-10: 50 mg via ORAL
  Filled 2020-07-10: qty 1

## 2020-07-10 NOTE — Progress Notes (Signed)
PROGRESS NOTE    [name]  [id number] DOB: 14-Feb-1931 DOA: 07/07/2020 PCP: Tonia Ghent, MD    Brief Narrative: HPI per Dr. Posey Pronto on 07/07/2020 [name] is a 87 y.o. female with medical history significant for COPD, chronic respiratory failure with hypoxia on 3-3.5 L of home O2 via [place] at home, CAD s/p DES, hypertension, hyperlipidemia, hypothyroidism, breast cancer s/p lumpectomy on anastrozole, depression/anxiety who presents to the ED for evaluation of generalized weakness.  Patient states over the last 3 days she has been feeling generally weak with poor appetite and has not been eating much.  Earlier today she was try to get out of bed and just feels so weak that she cannot walk.  She laid down on the floor and was trying to crawl to the bathroom but felt too weak to move.  She cannot get herself back up on the bed.  She says she did not fall or injure herself.  She reports a new cough productive of clear sputum which is significantly worse than her baseline.  She reports chronic shortness of breath which she says is not really changed from her usual.  She denies any subjective fevers, chills, diaphoresis, nausea, vomiting, abdominal pain, dysuria, chest pain.  She says she did receive 2 doses of the Pfizer Covid vaccine in the past.  She says has not yet been able to get the booster shot as she is not driving on her own. He has    Assessment & Plan:   Principal Problem:   Acute on chronic respiratory failure with hypoxia (HCC) Active Problems:   Hypothyroidism   Hyperlipidemia with target LDL less than 70   Gold C Copd with asthmatic bronchitis component and chronic resp failure   HTN (hypertension)   Coronary artery disease involving native coronary artery of native heart with unstable angina pectoris (HCC)   History of CVA (cerebrovascular accident)   Generalized weakness   COVID-19 virus infection   #1 acute on chronic hypoxic respiratory  failure secondary to COPD exacerbation/Covid bronchitis-on admission she was 81% on room air.  She is on 3 L of oxygen at home 24/7.  She was admitted with cough increasing shortness of breath. Chest x-ray did not reveal any acute findings.  CRP was low and her hypoxia improved with Decadron.  Currently she is on 2-1/2 L with saturation above 92%. Continue steroids Decadron 6 mg daily She was not started on remdesivir since she was not acutely hypoxic, CRP low, chest x-ray no infiltrates. Encourage incentive spirometer and flutter valve.  Discussed with nurse in the ER. Patient received two doses of Covid vaccine [employer].  Has not taken booster yet.  #2 history of O2 dependent COPD continue albuterol Spiriva incentive spirometer and supportive treatments.  #3 history of stroke CT head shows old stroke and cerebral atrophy.  On Plavix and rosuvastatin.  #4 CAD-mildly elevated troponin with no acute EKG findings likely demand ischemia.  Continue Plavix and statin.  #5 generalized weakness patient was found on the floor.  She tried to get out of bed and was too weak and was found on the floor by EMS.  She denies loss of consciousness.  CT head no acute finding CT neck no acute findings x-rays of her arms no fracture.  Seen by physical therapy recommending SNF.  #6 depression anxiety on BuSpar and Cymbalta, restart Xanax.  #7 hyperlipidemia on statin  #8 hypothyroidism on Synthroid  #9 history of essential hypertension blood pressure  147/77.  She is not on any medications at home.  Will monitor.  #10 thrombocytopenia monitor on Lovenox  #11 anemia likely secondary to chronic disease, macrocytic and with low ferritin-check anemia panel B12 and folate and FOBT.  Her hemoglobin dropped to 9.6 from 10.4 this is likely hemodilution from IV fluids.  Increase ferrous sulfate to twice a day continue iron supplementation with vitamin C.  DVT prophylaxis: Lovenox Code Status: DNR Family Communication:  No family at bedside. Disposition Plan:  Status is: Inpatient Dispo: The patient is from: Home              Anticipated d/c is to: SNF              Anticipated d/c date is: 2 days              Patient currently is not medically stable to d/c.    Consultants:   None  Procedures: None Antimicrobials: None Subjective: Wants to go home.  Does not want to go to SNF.  No nausea no vomiting.  Continues to have cough and shortness of breath.  Objective: Vitals:   07/09/20 2032 07/10/20 0500 07/10/20 0505 07/10/20 1338  BP: (!) 144/86  (!) 132/58 (!) 148/92  Pulse: (!) 58  (!) 55 62  Resp: 20  16 15   Temp: 98.5 F (36.9 C)  98.1 F (36.7 C) (!) 97.1 F (36.2 C)  TempSrc: Oral  Oral   SpO2: 100%  96% 98%  Weight:  61.4 kg    Height:        Intake/Output Summary (Last 24 hours) at 07/10/2020 1839 Last data filed at 07/10/2020 1700 Gross per 24 hour  Intake 960 ml  Output 1650 ml  Net -690 ml   Filed Weights   07/07/20 1558 07/10/20 0500  Weight: 59 kg 61.4 kg    Examination:  General: Appear in mild distress, no Rash; Oral Mucosa Clear, moist. no Abnormal Neck Mass Or lumps, Conjunctiva normal  Cardiovascular: S1 and S2 Present, no Murmur, Respiratory: good respiratory effort, Bilateral Air entry present and bilateral  Crackles, no wheezes Abdomen: Bowel Sound present, Soft and no tenderness Extremities: no Pedal edema Neurology: alert and oriented to time, place, and person affect appropriate. no new focal deficit Gait not checked due to patient safety concerns     Data Reviewed: I have personally reviewed following labs and imaging studies  CBC: Recent Labs  Lab 07/07/20 1538 07/08/20 0430 07/09/20 0557 07/10/20 0335  WBC 4.3 3.1* 2.9* 5.5  NEUTROABS 3.6 2.5 2.2 4.8  HGB 10.7* 10.4* 9.6* 9.5*  HCT 33.9* 31.6* 30.2* 30.0*  MCV 103.7* 102.6* 101.7* 101.4*  PLT 128* 131* 120* 140*   Basic Metabolic Panel: Recent Labs  Lab 07/07/20 1538 07/08/20 0430  07/09/20 0557 07/10/20 0335  NA 137 144 140 140  K 3.7 3.6 3.6 3.8  CL 96* 104 99 98  CO2 32 31 32 31  GLUCOSE 108* 117* 140* 131*  BUN 14 12 16 15   CREATININE 0.62 0.61 0.50 0.67  CALCIUM 8.8* 8.4* 8.9 8.7*  MG  --  1.9 1.9 1.8  PHOS  --  3.4 3.6 3.4   GFR: Estimated Creatinine Clearance: 42.7 mL/min (by C-G formula based on SCr of 0.67 mg/dL). Liver Function Tests: Recent Labs  Lab 07/07/20 1538 07/08/20 0430 07/09/20 0557 07/10/20 0335  AST 40 44* 52* 64*  ALT 15 17 20 20   ALKPHOS 38 32* 33* 31*  BILITOT 0.5 0.5 0.5  0.5  PROT 7.1 6.7 6.7 6.5  ALBUMIN 3.7 3.5 3.5 3.4*   No results for input(s): LIPASE, AMYLASE in the last 168 hours. No results for input(s): AMMONIA in the last 168 hours. Coagulation Profile: No results for input(s): INR, PROTIME in the last 168 hours. Cardiac Enzymes: Recent Labs  Lab 07/07/20 1538  CKTOTAL 260*   BNP (last 3 results) No results for input(s): PROBNP in the last 8760 hours. HbA1C: No results for input(s): HGBA1C in the last 72 hours. CBG: No results for input(s): GLUCAP in the last 168 hours. Lipid Profile: No results for input(s): CHOL, HDL, LDLCALC, TRIG, CHOLHDL, LDLDIRECT in the last 72 hours. Thyroid Function Tests: No results for input(s): TSH, T4TOTAL, FREET4, T3FREE, THYROIDAB in the last 72 hours. Anemia Panel: Recent Labs    07/09/20 0557 07/10/20 0335  VITAMINB12  --  239  FOLATE  --  18.6  FERRITIN 32 33  TIBC  --  297  IRON  --  56   Sepsis Labs: Recent Labs  Lab 07/07/20 1538  PROCALCITON <0.10  LATICACIDVEN 1.3    Recent Results (from the past 240 hour(s))  Resp Panel by RT-PCR (Flu A&B, Covid) Nasopharyngeal Swab     Status: Abnormal   Collection Time: 07/07/20  3:38 PM   Specimen: Nasopharyngeal Swab; Nasopharyngeal(NP) swabs in vial transport medium  Result Value Ref Range Status   SARS Coronavirus 2 by RT PCR POSITIVE (A) NEGATIVE Final    Comment: RESULT CALLED TO, READ BACK BY AND  VERIFIED WITH: M.VENTER, PA AT 1800 ON 07/07/20 BY N.THOMPSON (NOTE) SARS-CoV-2 target nucleic acids are DETECTED.  The SARS-CoV-2 RNA is generally detectable in upper respiratory specimens during the acute phase of infection. Positive results are indicative of the presence of the identified virus, but do not rule out bacterial infection or co-infection with other pathogens not detected by the test. Clinical correlation with patient history and other diagnostic information is necessary to determine patient infection status. The expected result is Negative.  Fact Sheet for Patients: EntrepreneurPulse.com.au  Fact Sheet for Healthcare Providers: IncredibleEmployment.be  This test is not yet approved or cleared by the [place] FDA and  has been authorized for detection and/or diagnosis of SARS-CoV-2 by FDA under an Emergency Use Authorization (EUA).  This EUA will remain in effect (meaning thi s test can be used) for the duration of  the COVID-19 declaration under Section 564(b)(1) of the Act, 21 U.S.C. section 360bbb-3(b)(1), unless the authorization is terminated or revoked sooner.     Influenza A by PCR NEGATIVE NEGATIVE Final   Influenza B by PCR NEGATIVE NEGATIVE Final    Comment: (NOTE) The Xpert Xpress SARS-CoV-2/FLU/RSV plus assay is intended as an aid in the diagnosis of influenza from Nasopharyngeal swab specimens and should not be used as a sole basis for treatment. Nasal washings and aspirates are unacceptable for Xpert Xpress SARS-CoV-2/FLU/RSV testing.  Fact Sheet for Patients: EntrepreneurPulse.com.au  Fact Sheet for Healthcare Providers: IncredibleEmployment.be  This test is not yet approved or cleared by the [place] FDA and has been authorized for detection and/or diagnosis of SARS-CoV-2 by FDA under an Emergency Use Authorization (EUA). This EUA will remain in effect (meaning  this test can be used) for the duration of the COVID-19 declaration under Section 564(b)(1) of the Act, 21 U.S.C. section 360bbb-3(b)(1), unless the authorization is terminated or revoked.  Performed at [facility], [place] [address] [place], [place] [address]   Blood Culture (routine x  2)     Status: None (Preliminary result)   Collection Time: 07/07/20  3:38 PM   Specimen: BLOOD LEFT FOREARM  Result Value Ref Range Status   Specimen Description   Final    BLOOD LEFT FOREARM Performed at [facility] [address], [place], [place] [address]    Special Requests   Final    BOTTLES DRAWN AEROBIC AND ANAEROBIC Blood Culture adequate volume Performed at [facility] [address], [place], [place] [address]    Culture   Final    NO GROWTH 3 DAYS Performed at [facility], [place] [address] [place], [place] [address]    Report Status PENDING  Incomplete  Blood Culture (routine x 2)     Status: None (Preliminary result)   Collection Time: 07/07/20  3:38 PM   Specimen: BLOOD RIGHT FOREARM  Result Value Ref Range Status   Specimen Description   Final    BLOOD RIGHT FOREARM Performed at [facility] [address] [place], [place] [address]    Special Requests   Final    BOTTLES DRAWN AEROBIC AND ANAEROBIC Blood Culture adequate volume Performed at [facility] [address], [place], [place] [address]    Culture   Final    NO GROWTH 3 DAYS Performed at [facility], [place] [address], [place],  [address]    Report Status PENDING  Incomplete         Radiology Studies: No results found.      Scheduled Meds: . anastrozole  1 mg Oral Daily  . vitamin C  500 mg Oral Daily  . busPIRone  5 mg Oral BID  . cholecalciferol  1,000 Units Oral Daily  . clopidogrel  75 mg Oral Daily  . dexamethasone  6 mg Oral Q24H  . DULoxetine  60 mg Oral Daily  . enoxaparin  (LOVENOX) injection  40 mg Subcutaneous Q24H  . ferrous sulfate  325 mg Oral BID WC  . fluticasone  2 spray Each Nare Daily  . levothyroxine  112 mcg Oral Q0600  . pantoprazole  20 mg Oral BID  . rosuvastatin  10 mg Oral Daily  . traZODone  50 mg Oral QHS  . umeclidinium bromide  1 puff Inhalation Daily  . zinc sulfate  220 mg Oral Daily   Continuous Infusions:   LOS: 2 days   Berle Mull, MD  07/10/2020, 6:39 PM

## 2020-07-11 DIAGNOSIS — J9621 Acute and chronic respiratory failure with hypoxia: Secondary | ICD-10-CM | POA: Diagnosis not present

## 2020-07-11 LAB — CBC WITH DIFFERENTIAL/PLATELET
Abs Immature Granulocytes: 0.01 10*3/uL (ref 0.00–0.07)
Basophils Absolute: 0 10*3/uL (ref 0.0–0.1)
Basophils Relative: 0 %
Eosinophils Absolute: 0 10*3/uL (ref 0.0–0.5)
Eosinophils Relative: 0 %
HCT: 30.6 % — ABNORMAL LOW (ref 36.0–46.0)
Hemoglobin: 9.9 g/dL — ABNORMAL LOW (ref 12.0–15.0)
Immature Granulocytes: 0 %
Lymphocytes Relative: 10 %
Lymphs Abs: 0.4 10*3/uL — ABNORMAL LOW (ref 0.7–4.0)
MCH: 32.6 pg (ref 26.0–34.0)
MCHC: 32.4 g/dL (ref 30.0–36.0)
MCV: 100.7 fL — ABNORMAL HIGH (ref 80.0–100.0)
Monocytes Absolute: 0.3 10*3/uL (ref 0.1–1.0)
Monocytes Relative: 6 %
Neutro Abs: 3.5 10*3/uL (ref 1.7–7.7)
Neutrophils Relative %: 84 %
Platelets: 143 10*3/uL — ABNORMAL LOW (ref 150–400)
RBC: 3.04 MIL/uL — ABNORMAL LOW (ref 3.87–5.11)
RDW: 13.4 % (ref 11.5–15.5)
WBC: 4.2 10*3/uL (ref 4.0–10.5)
nRBC: 0 % (ref 0.0–0.2)

## 2020-07-11 LAB — MAGNESIUM: Magnesium: 1.9 mg/dL (ref 1.7–2.4)

## 2020-07-11 LAB — COMPREHENSIVE METABOLIC PANEL
ALT: 20 U/L (ref 0–44)
AST: 51 U/L — ABNORMAL HIGH (ref 15–41)
Albumin: 3.4 g/dL — ABNORMAL LOW (ref 3.5–5.0)
Alkaline Phosphatase: 31 U/L — ABNORMAL LOW (ref 38–126)
Anion gap: 10 (ref 5–15)
BUN: 18 mg/dL (ref 8–23)
CO2: 32 mmol/L (ref 22–32)
Calcium: 8.8 mg/dL — ABNORMAL LOW (ref 8.9–10.3)
Chloride: 97 mmol/L — ABNORMAL LOW (ref 98–111)
Creatinine, Ser: 0.61 mg/dL (ref 0.44–1.00)
GFR, Estimated: >60 mL/min (ref >60)
Glucose, Bld: 132 mg/dL — ABNORMAL HIGH (ref 70–99)
Potassium: 3.9 mmol/L (ref 3.5–5.1)
Sodium: 139 mmol/L (ref 135–145)
Total Bilirubin: 0.4 mg/dL (ref 0.3–1.2)
Total Protein: 6.5 g/dL (ref 6.5–8.1)

## 2020-07-11 LAB — FERRITIN: Ferritin: 32 ng/mL (ref 11–307)

## 2020-07-11 LAB — D-DIMER, QUANTITATIVE: D-Dimer, Quant: 0.65 ug/mL-FEU — ABNORMAL HIGH (ref 0.00–0.50)

## 2020-07-11 LAB — C-REACTIVE PROTEIN: CRP: <0.5 mg/dL (ref <1.0)

## 2020-07-11 LAB — PHOSPHORUS: Phosphorus: 3.7 mg/dL (ref 2.5–4.6)

## 2020-07-11 MED ORDER — ZINC SULFATE 220 (50 ZN) MG PO CAPS: 220.0000 mg | ORAL_CAPSULE | Freq: Every day | ORAL | 0 refills | Status: DC

## 2020-07-11 MED ORDER — ASCORBIC ACID 500 MG PO TABS: 500.0000 mg | ORAL_TABLET | Freq: Every day | ORAL | 0 refills | Status: DC

## 2020-07-11 MED ORDER — ALUM & MAG HYDROXIDE-SIMETH 200-200-20 MG/5ML PO SUSP
30.0000 mL | Freq: Four times a day (QID) | ORAL | Status: DC | PRN
  Administered 2020-07-11: 30 mL via ORAL
  Filled 2020-07-11: qty 30

## 2020-07-11 MED ORDER — DEXAMETHASONE 6 MG PO TABS: 6.0000 mg | ORAL_TABLET | Freq: Every day | ORAL | 0 refills | Status: DC

## 2020-07-11 NOTE — Care Management Important Message (Signed)
Important Message  Patient Details IM Letter given to the Patient. Name: [name] MRN: [id number] Date of Birth: 1931/05/14   Medicare Important Message Given:  Yes     Caren Macadam 07/11/2020, 12:18 PM

## 2020-07-11 NOTE — TOC Transition Note (Signed)
Transition of Care [facility]) - CM/SW Discharge Note   Patient Details  Name: [name] MRN: [id number] Date of Birth: Dec 08, 1930  Transition of Care [name]) CM/SW Contact:  Ida Rogue, LCSW Phone Number: 07/11/2020, 10:50 AM   Clinical Narrative:  Spoke with Mr Daphine Deutscher, daughter's husband, who confirmed that he had just heard from the MD and that the family is planning on bringing the patient home.  He states his wife is due to arrive back home from out of state tonight at 5:30, and so she will not be picked up until later this afternoon.  He requested we order O2 for her for transport home, and that we set her up with [facility] for [facility] services.  I spoke with Ian Malkin who will arrange for delivery of travel cannister.  I spoke with Cindie who agreed to service patient for PT/OT. No further needs identified. TOC will continue to follow during the course of hospitalization.      Final next level of care: Home w Home Health Services Barriers to Discharge: No Barriers Identified   Patient Goals and CMS Choice        Discharge Placement                       Discharge Plan and Services                            [facility] Agency: [facility] Date [facility] Agency Contacted: 07/11/20 Time HH Agency Contacted: 1050 Representative spoke with at [facility] Agency: Cindie  Social Determinants of Health (SDOH) Interventions     Readmission Risk Interventions No flowsheet data found.

## 2020-07-11 NOTE — Progress Notes (Signed)
Went over discharge instructions w/ pt. Pt verbalized understanding. I attempted to call pt daughter Sharl Ma to go over instructions w/ her but no answer.

## 2020-07-11 NOTE — Progress Notes (Signed)
Physical Therapy Treatment Patient Details Name: [name] MRN: [id number] DOB: [birth year] Today's Date: 07/11/2020    History of Present Illness [name] is a 87 y.o. female with medical history significant for COPD, chronic respiratory failure with hypoxia on 3-3.5 L of home O2 via [place] at home, CAD s/p DES, hypertension, hyperlipidemia, hypothyroidism, breast cancer s/p lumpectomy on anastrozole, depression/anxiety who presents to the ED for evaluation of generalized weakness. Found to be COVID positive.    PT Comments    Pt able to come to sitting EOB without physical assist, using bedrail and elevated HOB to come up and scoot out to place feet on floor. Pt able to stand at EOB with single HHA requiring min guard to pivot over to chair due to slight unsteadiness. Pt tolerates short ambulation in room and 3x30 sec standing marching with RW without SOB noted, able to continue conversation and no labored breathing. Ambulation limited due to wall O2 connection, pt able to safely manage O2 tubing appropriately during short ambulation. Pt cued for BLE strengthening while seated in chair with good carryover. Pt tolerates remaining up in chair at EOS -RN notified. Updated d/c recommendations to HHPT and 24hr assist due to improvement in functional status acutely and pt's daughters able to assist pt at home. Pt will benefit from continued physical therapy in hospital and updated d/c recommendations below to increase strength, balance, endurance for safe ADLs and gait.   Follow Up Recommendations  Home health PT;Supervision/Assistance - 24 hour     Equipment Recommendations  3in1 (PT)    Recommendations for Other Services       Precautions / Restrictions Precautions Precautions: Fall Restrictions Weight Bearing Restrictions: No    Mobility  Bed Mobility Overal bed mobility: Needs Assistance Bed Mobility: Supine to Sit  Supine to sit: Supervision;HOB elevated  General  bed mobility comments: pt comes to sitting EOB with use of bedrail and elevated HOB, no physical assist, initial cues for bedrail use to assist self, cues to scoot out and place feet on floor once seated EOB to improve sitting  Transfers Overall transfer level: Needs assistance Equipment used: 1 person hand held assist;Rolling walker (2 wheeled) Transfers: Sit to/from [employer] Sit to Stand: Supervision Stand pivot transfers: Min guard    General transfer comment: SUPV with BUE powering up from bedside and therapist HHA, min guard to steady over to bedside chair; completes additional reps in chair with RW, initial education on powering up from chair and avoiding pulling on RW to rise with good carryover  Ambulation/Gait Ambulation/Gait assistance: Min guard Gait Distance (Feet): 5 Feet Assistive device: Rolling walker (2 wheeled) Gait Pattern/deviations: Step-to pattern;Decreased stride length Gait velocity: decreased   General Gait Details: short, shuffling steps with increased lateral weight-shifting forward and backwards at bedside; pt tolerates 3x30 sec standing marching bouts with standing rest break between, able to continue conversation and no labored breathing noted   Stairs             Wheelchair Mobility    Modified Rankin (Stroke Patients Only)       Balance Overall balance assessment: Needs assistance Sitting-balance support: No upper extremity supported;Feet supported Sitting balance-Leahy Scale: Good Sitting balance - Comments: seated EOB   Standing balance support: During functional activity;Bilateral upper extremity supported Standing balance-Leahy Scale: Fair Standing balance comment: able to release UE support statically, dynamically reliant on UE support         Cognition Arousal/Alertness: Awake/alert Behavior During Therapy:  WFL for tasks assessed/performed Overall Cognitive Status: Within Functional Limits for tasks assessed            Exercises General Exercises - Lower Extremity Long Arc Quad: Seated;AROM;Strengthening;Both;20 reps Hip Flexion/Marching: Seated;AROM;Strengthening;Both;20 reps Other Exercises Other Exercises: STS, 10 reps SUPV with RW from chair    General Comments        Pertinent Vitals/Pain Pain Assessment: No/denies pain    Home Living                      Prior Function            PT Goals (current goals can now be found in the care plan section) Acute Rehab PT Goals Patient Stated Goal: To live in her house PT Goal Formulation: With patient Time For Goal Achievement: 07/22/20 Potential to Achieve Goals: Good Progress towards PT goals: Progressing toward goals    Frequency    Min 2X/week      PT Plan Discharge plan needs to be updated    Co-evaluation              AM-PAC PT "6 Clicks" Mobility   Outcome Measure  Help needed turning from your back to your side while in a flat bed without using bedrails?: None Help needed moving from lying on your back to sitting on the side of a flat bed without using bedrails?: A Little Help needed moving to and from a bed to a chair (including a wheelchair)?: A Little Help needed standing up from a chair using your arms (e.g., wheelchair or bedside chair)?: None Help needed to walk in hospital room?: A Little Help needed climbing 3-5 steps with a railing? : Total 6 Click Score: 18    End of Session Equipment Utilized During Treatment: Oxygen Activity Tolerance: Patient tolerated treatment well Patient left: in chair;with call bell/phone within reach;with chair alarm set Nurse Communication: Mobility status PT Visit Diagnosis: Unsteadiness on feet (R26.81);Other abnormalities of gait and mobility (R26.89);Muscle weakness (generalized) (M62.81)     Time: 1610-9604 PT Time Calculation (min) (ACUTE ONLY): 25 min  Charges:  $Gait Training: 8-22 mins $Therapeutic Exercise: 8-22 mins                       Tori Richey Doolittle PT, DPT 07/11/20, 12:22 PM

## 2020-07-11 NOTE — Telephone Encounter (Signed)
Please Advise

## 2020-07-12 ENCOUNTER — Telehealth: Payer: Self-pay

## 2020-07-12 LAB — CULTURE, BLOOD (ROUTINE X 2)
Culture: NO GROWTH
Culture: NO GROWTH
Special Requests: ADEQUATE
Special Requests: ADEQUATE

## 2020-07-12 NOTE — Telephone Encounter (Signed)
Transition Care Management Unsuccessful Follow-up Telephone Call  Date of discharge and from where:  07/11/2020, [facility]  Attempts:  1st Attempt  Reason for unsuccessful TCM follow-up call:  Left voice message

## 2020-07-12 NOTE — Telephone Encounter (Signed)
Returning call.

## 2020-07-12 NOTE — Telephone Encounter (Signed)
Transition Care Management Unsuccessful Follow-up Telephone Call  Date of discharge and from where:  07/11/2020, [name]   Attempts:  2nd Attempt  Reason for unsuccessful TCM follow-up call:  No answer/busy

## 2020-07-12 NOTE — Telephone Encounter (Signed)
Transition Care Management Follow-up Telephone Call  Date of discharge and from where: 07/11/2020, [facility]  How have you been since you were released from the hospital? Patient is doing okay. Cough and fatigue still noted.   Any questions or concerns? No  Items Reviewed:  Did the pt receive and understand the discharge instructions provided? Yes   Medications obtained and verified? Yes   Other? No   Any new allergies since your discharge? No   Dietary orders reviewed? Yes  Do you have support at home? Yes   Home Care and Equipment/Supplies: Were home health services ordered? yes If so, what is the name of the agency? [facility]  Has the agency set up a time to come to the patient's home? no Were any new equipment or medical supplies ordered?  No What is the name of the medical supply agency? N/A Were you able to get the supplies/equipment? not applicable Do you have any questions related to the use of the equipment or supplies? No  Functional Questionnaire: (I = Independent and D = Dependent) ADLs: I  Bathing/Dressing- I  Meal Prep- I  Eating- I  Maintaining continence- I  Transferring/Ambulation- I  Managing Meds- I  Follow up appointments reviewed:   PCP Hospital f/u appt confirmed? Yes  Scheduled to see Dr. Para March on 07/18/2020 @ 4 pm.  Specialist Hospital f/u appt confirmed? N/A   Are transportation arrangements needed? No   If their condition worsens, is the pt aware to call PCP or go to the Emergency Dept.? Yes  Was the patient provided with contact information for the PCP's office or ED? Yes  Was to pt encouraged to call back with questions or concerns? Yes

## 2020-07-12 NOTE — Telephone Encounter (Signed)
Sent. Thanks.   

## 2020-07-13 NOTE — Telephone Encounter (Signed)
Noted. Thanks.

## 2020-07-13 NOTE — Telephone Encounter (Signed)
Patient left a voicemail stating that she has missed two calls from the office and requested that she get a call back.

## 2020-07-13 NOTE — Telephone Encounter (Signed)
I spoke with patient yesterday and phone call was completed. Please see documentation below.

## 2020-07-13 NOTE — Discharge Summary (Signed)
[facility] Discharge Summary   Patient: [name] [id number]  PCP: Tonia Ghent, MD  Date of admission: 07/07/2020   Date of discharge: 07/11/2020     Discharge Diagnoses:  Principal Problem:   Acute on chronic respiratory failure with hypoxia ([place]) Active Problems:   Hypothyroidism   Hyperlipidemia with target LDL less than 70   Gold C Copd with asthmatic bronchitis component and chronic resp failure   HTN (hypertension)   Coronary artery disease involving native coronary artery of native heart with unstable angina pectoris ([place])   History of CVA (cerebrovascular accident)   Generalized weakness   COVID-19 virus infection   Admitted From: home Disposition:  Home pt refused SNF  Recommendations for Outpatient Follow-up:  1. PCP: follow up with PCP in 1 week 2. Follow up LABS/TEST:  none   Follow-up Information    Tonia Ghent, MD. Schedule an appointment as soon as possible for a visit in 1 week(s).   Specialty: Family Medicine Contact information: [place] [address] [phone]        Care, [facility] Follow up.   Specialty: Home Health Services Why: Unfortunately, they will not be able to start with you until the week of the 10th.  Call them if you have not heard from them by  Friday Contact information: [address] [place] [address] [phone]              Discharge Instructions    Diet - low sodium heart healthy   Complete by: As directed    Increase activity slowly   Complete by: As directed       Diet recommendation: Cardiac diet  Activity: The patient is advised to gradually reintroduce usual activities, as tolerated  Discharge Condition: stable  Code Status: DNR   History of present illness: As per the H and P dictated on admission, "[name] is a 87 y.o. female with medical history significant for COPD, chronic respiratory failure with hypoxia  on 3-3.5 L of home O2 via [place] at home, CAD s/p DES, hypertension, hyperlipidemia, hypothyroidism, breast cancer s/p lumpectomy on anastrozole, depression/anxiety who presents to the ED for evaluation of generalized weakness.  Patient states over the last 3 days she has been feeling generally weak with poor appetite and has not been eating much.  Earlier today she was try to get out of bed and just feels so weak that she cannot walk.  She laid down on the floor and was trying to crawl to the bathroom but felt too weak to move.  She cannot get herself back up on the bed.  She says she did not fall or injure herself.  She reports a new cough productive of clear sputum which is significantly worse than her baseline.  She reports chronic shortness of breath which she says is not really changed from her usual.  She denies any subjective fevers, chills, diaphoresis, nausea, vomiting, abdominal pain, dysuria, chest pain.  She says she did receive 2 doses of the Pfizer Covid vaccine in the past.  She says has not yet been able to get the booster shot as she is not driving on her own."  Hospital Course:  Summary of her active problems in the hospital is as following. acute on chronic hypoxic respiratory failure secondary to COPD exacerbation Covid on admission she was 81% on room air.   She is on 3 L of oxygen at home 24/7.  She was admitted with cough increasing shortness of breath. Chest x-ray did not reveal any acute findings.   CRP was low and her hypoxia improved with Decadron.   Currently she is on 2-1/2 L with saturation above 92%. Continue steroids Decadron 6 mg daily She was not started on remdesivir since she was not acutely hypoxic, CRP low, chest x-ray no infiltrates. Encourage incentive spirometer and flutter valve.  Discussed with nurse in the ER. Patient received two doses of Covid vaccine [employer].  Has not taken booster yet.  history of O2 dependent COPD  continue albuterol Spiriva  incentive spirometer and supportive treatments.  history of stroke  CT head shows old stroke and cerebral atrophy.  On Plavix and rosuvastatin.  CAD mildly elevated troponin with no acute EKG findings likely demand ischemia.  Continue Plavix and statin.  generalized weakness  patient was found on the floor.   She tried to get out of bed and was too weak and was found on the floor by EMS.   She denies loss of consciousness.   CT head no acute finding CT neck no acute findings x-rays of her arms no fracture.   Seen by physical therapy recommending SNF.  Depression anxiety  on BuSpar and Cymbalta, restart Xanax.  hyperlipidemia  on statin  hypothyroidism  on Synthroid  history of essential hypertension  blood pressure 147/77.  She is not on any medications at home.  Will monitor.  thrombocytopenia  monitor on Lovenox  anemia likely secondary to chronic disease, macrocytic and with low ferritin. Her hemoglobin dropped to 9.6 from 10.4 this is likely hemodilution from IV fluids.   continue iron supplementation with vitamin C.  Patient was seen by physical therapy, who recommended SNF, pt refused. On the day of the discharge the patient's vitals were stable, and no other acute medical condition were reported by patient. The patient was felt safe to be discharge at Home with Home health.  Consultants: none Procedures: none  DISCHARGE MEDICATION: Allergies as of 07/11/2020      Reactions   Doxycycline Other (See Comments)   GI upset   Sulfadiazine Other (See Comments)   REACTION: Fever, aches   Sulfa Antibiotics Rash      Medication List    STOP taking these medications   alendronate 70 MG tablet Commonly known as: Fosamax     TAKE these medications   albuterol 108 (90 Base) MCG/ACT inhaler Commonly known as: ProAir HFA Inhale 1-2 puffs into the lungs every 6 (six) hours as needed for wheezing or shortness of breath (Okay to fill with Ventolin or albuterol  HFA).   ALPRAZolam 1 MG tablet Commonly known as: XANAX TAKE 1/2 TABLET BY MOUTH TWO OR THREE TIMES DAILY AS NEEDED FOR ANXIETY What changed:   how much to take  how to take this  when to take this  reasons to take this  additional instructions   anastrozole 1 MG tablet Commonly known as: ARIMIDEX Take 1 tablet (1 mg total) by mouth daily.   ascorbic acid 500 MG tablet Commonly known as: VITAMIN C Take 1 tablet (500 mg total) by mouth daily.   CALTRATE 600 PO Take 600 mg by mouth daily.   cholecalciferol 25 MCG (1000 UNIT) tablet Commonly known as: VITAMIN D Take 1 tablet (1,000 Units total) by mouth daily.   clopidogrel 75 MG tablet Commonly known as: PLAVIX Take 1 tablet (75 mg total) by mouth daily.   dexamethasone 6 MG tablet Commonly known as:  DECADRON Take 1 tablet (6 mg total) by mouth daily.   diclofenac sodium 1 % Gel Commonly known as: VOLTAREN APPLY 4 GRAMS TO AFFECTED AREA 2 TIMES A DAY. What changed: See the new instructions.   DULoxetine 60 MG capsule Commonly known as: CYMBALTA NEW PRESCRIPTION REQUEST: TAKE ONE CAPSULE BY MOUTH DAILY What changed:   how much to take  how to take this  when to take this  additional instructions   ferrous sulfate 325 (65 FE) MG tablet Commonly known as: FerrouSul Take 1 tablet (325 mg total) by mouth daily with breakfast.   fluticasone 50 MCG/ACT nasal spray Commonly known as: FLONASE PLACE 2 SPRAYS INTO BOTH NOSTRILS DAILY.   hydroxypropyl methylcellulose / hypromellose 2.5 % ophthalmic solution Commonly known as: ISOPTO TEARS / GONIOVISC Place 1 drop into both eyes 3 (three) times daily as needed for dry eyes.   levothyroxine 112 MCG tablet Commonly known as: SYNTHROID NEW PRESCRIPTION REQUEST: TAKE ONE TABLET BY MOUTH DAILY What changed:   how much to take  how to take this  when to take this  additional instructions   multivitamin with minerals Tabs tablet Take 1 tablet by mouth  daily.   nitroGLYCERIN 0.4 MG SL tablet Commonly known as: NITROSTAT Place 1 tablet (0.4 mg total) under the tongue every 5 (five) minutes as needed for chest pain.   OCUVITE PRESERVISION PO Take 1 tablet by mouth daily.   OXYGEN Inhale 3-3.5 L into the lungs continuous.   pantoprazole 20 MG tablet Commonly known as: PROTONIX TAKE ONE TABLET BY MOUTH IN THE MORNING AND IN THE EVENING TO REPLACE OMEPRAZOLE DUE TO INTERACTIONS. What changed: See the new instructions.   rosuvastatin 10 MG tablet Commonly known as: CRESTOR TAKE ONE TABLET BY MOUTH DAILY   Spiriva HandiHaler 18 MCG inhalation capsule Generic drug: tiotropium PLACE 1 CAPSULE INTO INHALER AND INHALE BY MOUTH DAILY What changed:   how much to take  how to take this  when to take this  additional instructions   traZODone 50 MG tablet Commonly known as: DESYREL TAKE THREE TABLETS BY MOUTH AT BEDTIME AS NEEDED What changed:   how much to take  how to take this  when to take this  additional instructions   zinc sulfate 220 (50 Zn) MG capsule Take 1 capsule (220 mg total) by mouth daily.      Discharge Exam: Filed Weights   07/07/20 1558 07/10/20 0500 07/11/20 0500  Weight: 59 kg 61.4 kg 59.7 kg   Vitals:   07/10/20 2051 07/11/20 0550  BP: (!) 159/68 133/66  Pulse: 62 (!) 54  Resp: 19 18  Temp: 98.7 F (37.1 C) 98.7 F (37.1 C)  SpO2: 99% 98%   General: Appear in no distress, no Rash; Oral Mucosa Clear, moist. no Abnormal Neck Mass Or lumps, Conjunctiva normal  Cardiovascular: S1 and S2 Present, no Murmur Respiratory: good respiratory effort, Bilateral Air entry present and faint basal Crackles, no wheezes Abdomen: Bowel Sound present, Soft and no tenderness Extremities: no Pedal edema Neurology: alert and oriented to time, place, and person affect appropriate. no new focal deficit  The results of significant diagnostics from this hospitalization (including imaging, microbiology,  ancillary and laboratory) are listed below for reference.    Significant Diagnostic Studies: DG Forearm Right  Result Date: 07/08/2020 CLINICAL DATA:  Right arm pain EXAM: RIGHT FOREARM - 2 VIEW COMPARISON:  None. FINDINGS: There is no evidence of fracture or other focal bone lesions. Soft tissues  are unremarkable. IMPRESSION: Negative. Electronically Signed   By: Fidela Salisbury MD   On: 07/08/2020 00:57   CT Head Wo Contrast  Result Date: 07/07/2020 CLINICAL DATA:  Found on the floor. EXAM: CT HEAD WITHOUT CONTRAST CT CERVICAL SPINE WITHOUT CONTRAST TECHNIQUE: Multidetector CT imaging of the head and cervical spine was performed following the standard protocol without intravenous contrast. Multiplanar CT image reconstructions of the cervical spine were also generated. COMPARISON:  01/23/2017 FINDINGS: CT HEAD FINDINGS Brain: Moderately enlarged ventricles and subarachnoid spaces with mild progression. Stable left lateral frontal subdural hygroma or arachnoid cyst. Marked patchy white matter low density in both cerebral hemispheres with progression. Stable small, old left cerebellar infarct. No intracranial hemorrhage, mass lesion or CT evidence of acute infarction. Vascular: No hyperdense vessel or unexpected calcification. Skull: Normal. Negative for fracture or focal lesion. Sinuses/Orbits: Marked bilateral frontal and ethmoid sinus mucosal thickening with progression. Small amount of sphenoid sinus fluid. Status post bilateral cataract extraction. Other: None. CT CERVICAL SPINE FINDINGS Alignment: Stable straightening of the normal cervical lordosis and dextroconvex cervicothoracic scoliosis. No subluxations. Skull base and vertebrae: No acute fracture. No primary bone lesion or focal pathologic process. Soft tissues and spinal canal: No prevertebral fluid or swelling. No visible canal hematoma. Disc levels: Bilateral facet degenerative changes throughout the cervical spine. Upper chest: Clear lung  apices. Other: Bilateral carotid artery atheromatous calcifications. IMPRESSION: 1. No skull fracture or intracranial hemorrhage. 2. No cervical spine fracture or subluxation. 3. Progressive moderate diffuse cerebral and cerebellar atrophy. 4. Marked chronic small vessel white matter ischemic changes in both cerebral hemispheres with progression. 5. Stable small, old left cerebellar infarct. 6. Stable left lateral frontal subdural hygroma or arachnoid cyst. 7. Progressive marked chronic bilateral frontal sinusitis with possible acute sphenoid sinusitis. 8. Bilateral carotid artery atheromatous calcifications. 9. Bilateral facet degenerative changes throughout the cervical spine. Electronically Signed   By: Claudie Revering M.D.   On: 07/07/2020 16:43   CT Cervical Spine Wo Contrast  Result Date: 07/07/2020 CLINICAL DATA:  Found on the floor. EXAM: CT HEAD WITHOUT CONTRAST CT CERVICAL SPINE WITHOUT CONTRAST TECHNIQUE: Multidetector CT imaging of the head and cervical spine was performed following the standard protocol without intravenous contrast. Multiplanar CT image reconstructions of the cervical spine were also generated. COMPARISON:  01/23/2017 FINDINGS: CT HEAD FINDINGS Brain: Moderately enlarged ventricles and subarachnoid spaces with mild progression. Stable left lateral frontal subdural hygroma or arachnoid cyst. Marked patchy white matter low density in both cerebral hemispheres with progression. Stable small, old left cerebellar infarct. No intracranial hemorrhage, mass lesion or CT evidence of acute infarction. Vascular: No hyperdense vessel or unexpected calcification. Skull: Normal. Negative for fracture or focal lesion. Sinuses/Orbits: Marked bilateral frontal and ethmoid sinus mucosal thickening with progression. Small amount of sphenoid sinus fluid. Status post bilateral cataract extraction. Other: None. CT CERVICAL SPINE FINDINGS Alignment: Stable straightening of the normal cervical lordosis and  dextroconvex cervicothoracic scoliosis. No subluxations. Skull base and vertebrae: No acute fracture. No primary bone lesion or focal pathologic process. Soft tissues and spinal canal: No prevertebral fluid or swelling. No visible canal hematoma. Disc levels: Bilateral facet degenerative changes throughout the cervical spine. Upper chest: Clear lung apices. Other: Bilateral carotid artery atheromatous calcifications. IMPRESSION: 1. No skull fracture or intracranial hemorrhage. 2. No cervical spine fracture or subluxation. 3. Progressive moderate diffuse cerebral and cerebellar atrophy. 4. Marked chronic small vessel white matter ischemic changes in both cerebral hemispheres with progression. 5. Stable small, old left cerebellar infarct.  6. Stable left lateral frontal subdural hygroma or arachnoid cyst. 7. Progressive marked chronic bilateral frontal sinusitis with possible acute sphenoid sinusitis. 8. Bilateral carotid artery atheromatous calcifications. 9. Bilateral facet degenerative changes throughout the cervical spine. Electronically Signed   By: Claudie Revering M.D.   On: 07/07/2020 16:43   DG Chest Port 1 View  Result Date: 07/07/2020 CLINICAL DATA:  Mental status change.  Cough. EXAM: PORTABLE CHEST 1 VIEW COMPARISON:  September 14, 2019. FINDINGS: Cardiomediastinal silhouette is accentuated by AP portable technique. No new consolidation. Similar streaky opacities at the left lung base, likely atelectasis/scar. No visible pleural effusions or pneumothorax on this supine radiograph. No acute osseous abnormality. IMPRESSION: No evidence of acute cardiopulmonary disease. Electronically Signed   By: Margaretha Sheffield MD   On: 07/07/2020 15:32   DG Humerus Right  Result Date: 07/08/2020 CLINICAL DATA:  Right arm pain EXAM: RIGHT HUMERUS - 2+ VIEW COMPARISON:  None. FINDINGS: Two view radiograph right humerus demonstrates surgical changes of right total shoulder arthroplasty. No acute fracture or dislocation. No  lytic or blastic bone lesion. Soft tissues are unremarkable. IMPRESSION: Negative. Electronically Signed   By: Fidela Salisbury MD   On: 07/08/2020 00:58    Microbiology: Recent Results (from the past 240 hour(s))  Resp Panel by RT-PCR (Flu A&B, Covid) Nasopharyngeal Swab     Status: Abnormal   Collection Time: 07/07/20  3:38 PM   Specimen: Nasopharyngeal Swab; Nasopharyngeal(NP) swabs in vial transport medium  Result Value Ref Range Status   SARS Coronavirus 2 by RT PCR POSITIVE (A) NEGATIVE Final    Comment: RESULT CALLED TO, READ BACK BY AND VERIFIED WITH: M.VENTER, PA AT 1800 ON 07/07/20 BY N.THOMPSON (NOTE) SARS-CoV-2 target nucleic acids are DETECTED.  The SARS-CoV-2 RNA is generally detectable in upper respiratory specimens during the acute phase of infection. Positive results are indicative of the presence of the identified virus, but do not rule out bacterial infection or co-infection with other pathogens not detected by the test. Clinical correlation with patient history and other diagnostic information is necessary to determine patient infection status. The expected result is Negative.  Fact Sheet for Patients: EntrepreneurPulse.com.au  Fact Sheet for Healthcare Providers: IncredibleEmployment.be  This test is not yet approved or cleared by the [place] FDA and  has been authorized for detection and/or diagnosis of SARS-CoV-2 by FDA under an Emergency Use Authorization (EUA).  This EUA will remain in effect (meaning thi s test can be used) for the duration of  the COVID-19 declaration under Section 564(b)(1) of the Act, 21 U.S.C. section 360bbb-3(b)(1), unless the authorization is terminated or revoked sooner.     Influenza A by PCR NEGATIVE NEGATIVE Final   Influenza B by PCR NEGATIVE NEGATIVE Final    Comment: (NOTE) The Xpert Xpress SARS-CoV-2/FLU/RSV plus assay is intended as an aid in the diagnosis of influenza from  Nasopharyngeal swab specimens and should not be used as a sole basis for treatment. Nasal washings and aspirates are unacceptable for Xpert Xpress SARS-CoV-2/FLU/RSV testing.  Fact Sheet for Patients: EntrepreneurPulse.com.au  Fact Sheet for Healthcare Providers: IncredibleEmployment.be  This test is not yet approved or cleared by the [place] FDA and has been authorized for detection and/or diagnosis of SARS-CoV-2 by FDA under an Emergency Use Authorization (EUA). This EUA will remain in effect (meaning this test can be used) for the duration of the COVID-19 declaration under Section 564(b)(1) of the Act, 21 U.S.C. section 360bbb-3(b)(1), unless the authorization is terminated or revoked.  Performed at [facility], [place] [address], [place], [place] [address]   Blood Culture (routine x 2)     Status: None   Collection Time: 07/07/20  3:38 PM   Specimen: BLOOD LEFT FOREARM  Result Value Ref Range Status   Specimen Description   Final    BLOOD LEFT FOREARM Performed at [facility] [address] [place], [place] [address]    Special Requests   Final    BOTTLES DRAWN AEROBIC AND ANAEROBIC Blood Culture adequate volume Performed at [place] [facility] [address] [place], [place] [address]    Culture   Final    NO GROWTH 5 DAYS Performed at [facility], [place] [address] [place], [place] [address]    Report Status 07/12/2020 FINAL  Final  Blood Culture (routine x 2)     Status: None   Collection Time: 07/07/20  3:38 PM   Specimen: BLOOD RIGHT FOREARM  Result Value Ref Range Status   Specimen Description   Final    BLOOD RIGHT FOREARM Performed at [facility] [address], [place], [place] [address]    Special Requests   Final    BOTTLES DRAWN AEROBIC AND ANAEROBIC Blood Culture adequate volume Performed at [facility]  [address], [place], [place] [address]    Culture   Final    NO GROWTH 5 DAYS Performed at [facility], [place] [address], [place], [place] [address]    Report Status 07/12/2020 FINAL  Final     Labs: CBC: Recent Labs  Lab 07/07/20 1538 07/08/20 0430 07/09/20 0557 07/10/20 0335 07/11/20 0416  WBC 4.3 3.1* 2.9* 5.5 4.2  NEUTROABS 3.6 2.5 2.2 4.8 3.5  HGB 10.7* 10.4* 9.6* 9.5* 9.9*  HCT 33.9* 31.6* 30.2* 30.0* 30.6*  MCV 103.7* 102.6* 101.7* 101.4* 100.7*  PLT 128* 131* 120* 140* [id number]*   Basic Metabolic Panel: Recent Labs  Lab 07/07/20 1538 07/08/20 0430 07/09/20 0557 07/10/20 0335 07/11/20 0416  NA 137 144 140 140 139  K 3.7 3.6 3.6 3.8 3.9  CL 96* 104 99 98 97*  CO2 32 31 32 31 32  GLUCOSE 108* 117* 140* 131* 132*  BUN 14 12 16 15 18   CREATININE 0.62 0.61 0.50 0.67 0.61  CALCIUM 8.8* 8.4* 8.9 8.7* 8.8*  MG  --  1.9 1.9 1.8 1.9  PHOS  --  3.4 3.6 3.4 3.7   Liver Function Tests: Recent Labs  Lab 07/07/20 1538 07/08/20 0430 07/09/20 0557 07/10/20 0335 07/11/20 0416  AST 40 44* 52* 64* 51*  ALT 15 17 20 20 20   ALKPHOS 38 32* 33* 31* 31*  BILITOT 0.5 0.5 0.5 0.5 0.4  PROT 7.1 6.7 6.7 6.5 6.5  ALBUMIN 3.7 3.5 3.5 3.4* 3.4*   CBG: No results for input(s): GLUCAP in the last 168 hours.  Time spent: 35 minutes  Signed:  Berle Mull  [facility] 07/11/2020 8:39 AM

## 2020-07-18 ENCOUNTER — Telehealth: Payer: Self-pay | Admitting: *Deleted

## 2020-07-18 ENCOUNTER — Other Ambulatory Visit: Payer: Self-pay | Admitting: Family Medicine

## 2020-07-18 ENCOUNTER — Other Ambulatory Visit: Payer: Self-pay

## 2020-07-18 ENCOUNTER — Ambulatory Visit: Payer: Medicare PPO | Admitting: Family Medicine

## 2020-07-18 ENCOUNTER — Telehealth: Payer: Self-pay

## 2020-07-18 NOTE — Telephone Encounter (Signed)
Talked to pt who reports she does not feel herself lately and feels very isolated and is very depressed and just wants to lay down and not wake up because she cannot do what she used to do. Inquired if pt had a plan to kill herself and she said no, she just didn't want to feel isolated anymore. Pt was tearful a few times throughout the conversation. She said she just wants a pill to make her feel happy again so she doesn't feel bad anymore. Pt reports she has been taking all of her meds. Med list was reconciled with pt. Pt said she will not be able to make it to the office visit tomorrow because she is too tired and weak and she doesn't think she will be able to go anywhere for a while. Advised the apt could be changed to a VV. Pt wanted to apt changed to VV. Advised this nurse will contact pt's daughter to see if she can come over now and stay the night and see if she can be with her at the VV tomorrow. Pt appreciative.  She said her daughter from [place] was there but left 3 days ago. Her other daughter lives in town but has had covid also and had a bad week last week. She is recovered now and is coming to the pts house today at [place]. Inquired if ok to speak to pts daughter, pt gave ok.  Inquired if pts daughter, [name], could stay with her overnight and she said she probably will.

## 2020-07-18 NOTE — Telephone Encounter (Signed)
Pease advise on refill. Thank you.  

## 2020-07-18 NOTE — Patient Outreach (Signed)
[facility]) Care Management  07/18/2020  Angell Pincock 05-May-1931 [id number]   EMMI- General Discharge RED ON EMMI ALERT Day # 4 Date: 07/16/20 Red Alert Reason:  Read discharge papers?   No  Transportation to follow-up?   No  Lost interest in things?   Yes  Sad/hopeless/anxious/empty?   Yes    Outreach attempt: No answer.  HIPAA compliant voice message left.     Plan: RN CM will attempt again within 4 business days and send a letter.   Jone Baseman, RN, MSN [facility] Care Management Care Management Coordinator Direct Line [phone] Toll Free: [phone]  Fax: [phone]

## 2020-07-18 NOTE — Telephone Encounter (Signed)
I think it makes sense to have the visit tomorrow as either a video visit or phone visit.  If she is clearly feeling worse in the meantime, meaning dramatically more short of breath, dramatically worse depressive symptoms leading to thoughts of self-harm, etc, then it makes sense to go to the ER/dial 911 tonight.  Otherwise I think it makes sense to keep the appointment tomorrow.  Thanks.

## 2020-07-18 NOTE — Telephone Encounter (Signed)
Called and spoke with patient to confirm appointment tomorrow. Patient stated that it has been changed to a virtual visit. Patient also stated that she is feeling better since this morning and denies any depressive symptoms. Patient was very appreciative of call.

## 2020-07-18 NOTE — Telephone Encounter (Signed)
[facility] Night - Client TELEPHONE ADVICE RECORD [facility] Patient Name: [name] Gender: Female DOB: [birth year] Age: 87 Y 77 M 5 D Return Phone Number: [phone] (Primary) Address: City/State/Zip: [place] [address] Client [facility] Night - Client Client Site [place] Physician Renford Dills - MD Contact Type Call Who Is Calling Patient / Member / Family / Caregiver Call Type Triage / Clinical Relationship To Patient Self Return Phone Number [phone] (Primary) Chief Complaint Weakness, Generalized Reason for Call Symptomatic / Request for [place] she was discharged from the hospital today with [place]. States she is feeling really bad, and she is wanting to know if someone can come giver her a booster shot and flu vaccine. States she feels weak, depressed, tired, and just wants to know what she can do? Translation No Nurse Assessment Nurse: Rolin Barry, RN, Levada Dy Date/Time (Eastern Time): 07/16/2020 5:00:06 PM Confirm and document reason for call. If symptomatic, describe symptoms. ---Caller States she was discharged from the hospital today with [place]. States she is feeling really bad, and she is wanting to know if someone can come giver her a booster shot and flu vaccine. States she feels weak, depressed, tired, and just wants to know what she can do? Does the patient have any new or worsening symptoms? ---Yes Will a triage be completed? ---Yes Related visit to physician within the last 2 weeks? ---Yes Does the PT have any chronic conditions? (i.e. diabetes, asthma, this includes High risk factors for pregnancy, etc.) ---Yes List chronic conditions. ---COPD Breast cancer Is this a behavioral health or substance abuse call? ---No Guidelines Guideline Title Affirmed Question Affirmed Notes Nurse Date/Time (Eastern Time) COVID-19 - Diagnosed or  Suspected Difficult to awaken or acting confused (e.g., disoriented, slurred speech) Deaton, RN, Levada Dy 07/16/2020 5:01:00 PM Depression [1] New or changed psychiatric medications > Deaton, RN, Levada Dy 07/16/2020 5:11:48 PM PLEASE NOTE: All timestamps contained within this report are represented as [place] Standard Time. CONFIDENTIALTY NOTICE: This fax transmission is intended only for the addressee. It contains information that is legally privileged, confidential or otherwise protected from use or disclosure. If you are not the intended recipient, you are strictly prohibited from reviewing, disclosing, copying using or disseminating any of this information or taking any action in reliance on or regarding this information. If you have received this fax in error, please notify us immediately by telephone so that we can arrange for its return to [place]. Phone: [phone], Toll-Free: [phone], Fax: [phone] Page: 2 of 2 Call Id: [id number] Guidelines Guideline Title Affirmed Question Affirmed Notes Nurse Date/Time Eilene Ghazi Time) 2 weeks ago AND [2] not feeling any better Disp. Time Eilene Ghazi Time) Disposition Final User 07/16/2020 5:11:18 PM Call EMS 911 Now Deaton, RN, Levada Dy 07/16/2020 5:15:14 PM SEE PCP WITHIN 3 DAYS Yes Deaton, RN, Cindee Lame Disagree/Comply Disagree Caller Understands Yes PreDisposition Did not know what to do Care Advice Given Per Guideline CALL EMS 911 NOW: * Immediate medical attention is needed. You need to hang up and call 911 (or an ambulance). * Triager Discretion: I'll call you back in a few minutes to be sure you were able to reach them. CARE ADVICE given per COVID-19 - DIAGNOSED OR SUSPECTED (Adult) guideline. SEE PCP WITHIN 3 DAYS: * You need to be seen within 2 or 3 days. CARE ADVICE given per Depression (Adult) guideline. * You become worse * You feel like harming yourself CALL BACK IF:  DEPRESSION - STAY ACTIVE: DEPRESSION - TIPS FOR HEALTHY LIVING: * There  are things you can do to feel better. * Get more sleep: Most people need 7 to 8 hours of sleep each night. Being well-rested improves your mood and your sense of well-being. * Eat healthy: Eat a well-balanced diet. * Talk with Friends and Family: Share how you are feeling with someone. Make sure that your spouse, family, or friends know how you are feeling. * Start a new hobby. Comments User: Saverio Danker, RN Date/Time Eilene Ghazi Time): 07/16/2020 5:11:12 PM Caller advised that she has a daughter that lives in [place] and one that lives here. Caller is alert and oriented at this time. User: Saverio Danker, RN Date/Time Eilene Ghazi Time): 07/16/2020 5:19:50 PM Caller advised that she is not wanting to go to the Ed. Advised that she feels safe in her home and can get around. Uses her walker and has a house full of food that her kids put in there. User: Saverio Danker, RN Date/Time Eilene Ghazi Time): 07/16/2020 5:21:01 PM Tried to call the callers daughter and no answer, did not leave a message. Advised that she spoke with her today. Referrals REFERRED TO PCP OFFICE

## 2020-07-18 NOTE — Telephone Encounter (Signed)
Tried to contact pt twice but not answer, left VM twice.   Contacted pt to see if she was there but she was not. Pt reported she did eat some spaghetti her other daughter made before she left and she is feeling "a little bit" better. Advised when her daughter gets there to have her call me.

## 2020-07-18 NOTE — Telephone Encounter (Signed)
Contacted PCP and pt has VV with PCP tomorrow at 4pm. PCP is aware of pt situation.

## 2020-07-18 NOTE — Telephone Encounter (Signed)
Herbert Deaner PT with [facility] left a voicemail stating that the patient was referred for PT from recently getting out of the hospital. Clair Gulling stated that the patient and her daughter is delaying PT at this time because the patient is fatigued and wants to sleep all the time. Clair Gulling stated based on what the daughter has advised him the patients heart rate and oxygen level is within normal limits. Clair Gulling stated that patient has an appointment tomorrow to follow-up with Dr. Damita Dunnings. Clair Gulling stated that he is wondering if the patient should go back to the hospital since she is feeling so bad. Clair Gulling requested that he be updated after the patient's appointment tomorrow. See other phone note.

## 2020-07-18 NOTE — Telephone Encounter (Signed)
Sent. Thanks.   

## 2020-07-19 ENCOUNTER — Other Ambulatory Visit: Payer: Self-pay

## 2020-07-19 ENCOUNTER — Telehealth (INDEPENDENT_AMBULATORY_CARE_PROVIDER_SITE_OTHER): Payer: Medicare PPO | Admitting: Family Medicine

## 2020-07-19 VITALS — Ht 64.0 in | Wt 132.0 lb

## 2020-07-19 DIAGNOSIS — F411 Generalized anxiety disorder: Secondary | ICD-10-CM

## 2020-07-19 DIAGNOSIS — U071 COVID-19: Secondary | ICD-10-CM

## 2020-07-19 MED ORDER — BUSPIRONE HCL 5 MG PO TABS: 10.0000 mg | ORAL_TABLET | Freq: Two times a day (BID) | ORAL | 3 refills | Status: DC

## 2020-07-19 NOTE — Telephone Encounter (Signed)
[name], pts daughter, called to report she does think pt needs apt with PCP today even if it is virtual. Advised pt does have apt today at 4pm. Advised this would be easiest on mychart. She did not know username or password. Gave her username and changed password and gave that to her. Advised CMA would call before apt and if anything else was needed to contact office. [name] appreciative and verbalized understanding.

## 2020-07-19 NOTE — Progress Notes (Signed)
Interactive audio and video telecommunications were attempted between this provider and patient, however failed, due to patient having technical difficulties OR patient did not have access to video capability.  We continued and completed visit with audio only.   Virtual Visit via Telephone Note  I connected with patient on 07/19/20  at 4:27 PM  by telephone and verified that I am speaking with the correct person using two identifiers.  Location of patient: home.    Location of MD: [facility] Name of referring provider (if blank then none associated): Names per persons and role in encounter:  MD: Earlyne Iba, Patient: name listed above.    I discussed the limitations, risks, security and privacy concerns of performing an evaluation and management service by telephone and the availability of in person appointments. I also discussed with the patient that there may be a patient responsible charge related to this service. The patient expressed understanding and agreed to proceed.  CC: hospital follow up.    History of Present Illness:   Inpatient course discussed with patient.  Treated for COPD exacerbation in the setting of COVID, with her baseline medical issues described below.  She did improve to the point of being discharged and is now at home with her daughter helping her out.  Still on oxygen at baseline.  Still noting fatigue, less energy, not talking as much, lower appetite, mood is lower.  She clearly improved to the point of being discharged but she is not back to her previous baseline.  She is up to the bathroom, on her own. She is using her walker.  Her daughter is at home with her.  She is using 4L O2.  Her breathing is improved compared to inpatient status.  Pulse ox 94% today.  Cough is better.  No fevers.    She is mainly worried about her mood at this point, less so about her respiratory status as her breathing symptoms have improved in the meantime.    Observations/Objective: No apparent distress. Speech at baseline  Assessment and Plan:  COPD exacerbation/COVID, still oxygen dependent but improving in the meantime and pulse ox is acceptable.  Cough is improving.  Would continue her current medications as is and she will update me as needed.  Mood.  Discussed with patient about options.  We talked about trying a higher dose of buspar instead of tapering cymbalta and starting wellbutrin.  She did not do well previously when she had stopped Cymbalta.  Rationale for trying the higher dose of BuSpar discussed with patient.  She agrees with plan.  Can increase BuSpar to 10 mg twice a day and update me as needed.  Follow Up Instructions: see above.     I discussed the assessment and treatment plan with the patient. The patient was provided an opportunity to ask questions and all were answered. The patient agreed with the plan and demonstrated an understanding of the instructions.   The patient was advised to call back or seek an in-person evaluation if the symptoms worsen or if the condition fails to improve as anticipated.  I provided 25 minutes of non-face-to-face time during this encounter.   Elsie Stain, MD  ================================ Date of admission: 07/07/2020             Date of discharge: 07/11/2020     Discharge Diagnoses:  Principal Problem:   Acute on chronic respiratory failure with hypoxia [facility]) Active Problems:   Hypothyroidism   Hyperlipidemia with target LDL less  than 64   Gold C Copd with asthmatic bronchitis component and chronic resp failure   HTN (hypertension)   Coronary artery disease involving native coronary artery of native heart with unstable angina pectoris (HCC)   History of CVA (cerebrovascular accident)   Generalized weakness   COVID-19 virus infection  Admitted From: home Disposition:  Home pt refused SNF  Recommendations for Outpatient Follow-up:  1. PCP: follow up with PCP in 1  week 2. Follow up LABS/TEST:  none  Diet recommendation: Cardiac diet  Activity: The patient is advised to gradually reintroduce usual activities, as tolerated  Discharge Condition: stable  Code Status: DNR   History of present illness: As per the H and P dictated on admission, "Florabel Faulks Gardneris a 87 y.o.femalewith medical history significant forCOPD, chronic respiratory failure with hypoxia on 3-3.5L of home O2 via [place] at home, CAD s/p DES, hypertension, hyperlipidemia, hypothyroidism, breast cancer s/p lumpectomy on anastrozole, depression/anxiety who presents to the ED for evaluation of generalized weakness.  Patient states over the last 3 days she has been feeling generally weak with poor appetite and has not been eating much. Earlier today she was try to get out of bed and just feels so weak that she cannot walk. She laid down on the floor and was trying to crawl to the bathroom but felt too weak to move. She cannot get herself back up on the bed. She says she did not fall or injure herself. She reports a new cough productive of clear sputum which is significantly worse than her baseline. She reports chronic shortness of breath which she says is not really changed from her usual. She denies any subjective fevers, chills, diaphoresis, nausea, vomiting, abdominal pain, dysuria, chest pain.  She says she did receive 2 doses of the Pfizer Covid vaccine in the past. She says has not yet been able to get the booster shot as she is not driving on her own."  Hospital Course:  Summary of her active problems in the hospital is as following. acute on chronic hypoxic respiratory failure secondary to COPD exacerbation Covid on admission she was 81% on room air.  She is on 3 L of oxygen at home 24/7.  She was admitted with cough increasing shortness of breath. Chest x-ray did not reveal any acute findings.  CRP was low and her hypoxia improved with Decadron.  Currently  she is on 2-1/2 L with saturation above 92%. Continue steroids Decadron 6 mg daily She was not started on remdesivir since she was not acutely hypoxic, CRP low, chest x-ray no infiltrates. Encourage incentive spirometer and flutter valve. Discussed with nurse in the ER. Patient received two doses of Covid vaccine [employer]. Has not taken booster yet.  history of O2 dependent COPD  continue albuterol Spiriva incentive spirometer and supportive treatments.  history of stroke  CT head shows old stroke and cerebral atrophy. On Plavix and rosuvastatin.  CAD mildly elevated troponin with no acute EKG findings likely demand ischemia. Continue Plavix and statin.  Generalized weakness  patient was found on the floor.  She tried to get out of bed and was too weak and was found on the floor by EMS.  She denies loss of consciousness.  CT head no acute finding CT neck no acute findings x-rays of her arms no fracture.  Seen by physical therapy recommending SNF.  Depression anxiety  on BuSpar and Cymbalta, restart Xanax.  hyperlipidemia  on statin  hypothyroidism  on Synthroid  history of  essential hypertension  blood pressure 147/77. She is not on any medications at home. Will monitor.  thrombocytopenia  monitor on Lovenox  anemia likely secondary to chronic disease, macrocytic and with low ferritin. Her hemoglobin dropped to 9.6 from 10.4 this is likely hemodilution from IV fluids.  continue iron supplementation with vitamin C.  Patient was seen by physical therapy, who recommended SNF, pt refused. On the day of the discharge the patient's vitals were stable, and no other acute medical condition were reported by patient. The patient was felt safe to be discharge at Home with Home health.  Consultants: none Procedures: none  DISCHARGE MEDICATION:      Allergies as of 07/11/2020      Reactions   Doxycycline Other (See Comments)   GI upset   Sulfadiazine  Other (See Comments)   REACTION: Fever, aches   Sulfa Antibiotics Rash

## 2020-07-19 NOTE — Patient Outreach (Signed)
[facility]) Care Management  07/19/2020  Salia Cangemi 01/21/1931 [id number]   EMMI- General Discharge RED ON EMMI ALERT Day # 4 Date:07/16/20 Red Alert Reason: Read discharge papers?  No  Transportation to follow-up?  No  Lost interest in things?  Yes  Sad/hopeless/anxious/empty?  Yes   Spoke with patient. She reports she is doing pretty good.  She states that her daughters are there. She states she has not had time to read discharge papers but she says her daughter has. Patient reports having follow up with physician. She denies transportation issues presently.  Patient reports feeling down about illness right now but has support from family presently. She states her daughter is back from [place].  Patient wanted to end call and declined any further nurse follow up.    Plan: RN CM will close case.   Jone Baseman, RN, MSN [place] Management Care Management Coordinator Direct Line [phone] Cell [phone] Toll Free: [phone]  Fax: [phone]

## 2020-07-20 ENCOUNTER — Other Ambulatory Visit: Payer: Self-pay | Admitting: Family Medicine

## 2020-07-20 NOTE — Assessment & Plan Note (Signed)
Anxiety and depression, no suicidal homicidal intent  Discussed with patient about options.  We talked about trying a higher dose of buspar instead of tapering cymbalta and starting wellbutrin.  She did not do well previously when she had stopped Cymbalta.  Rationale for trying the higher dose of BuSpar discussed with patient.  She agrees with plan.  Can increase BuSpar to 10 mg twice a day and update me as needed.

## 2020-07-20 NOTE — Telephone Encounter (Signed)
Sent. Thanks.   

## 2020-07-20 NOTE — Assessment & Plan Note (Signed)
COPD exacerbation/COVID, still oxygen dependent but improving in the meantime and pulse ox is acceptable.  Cough is improving.  Would continue her current medications as is and she will update me as needed.

## 2020-07-20 NOTE — Telephone Encounter (Signed)
Please Advise

## 2020-07-27 ENCOUNTER — Telehealth: Payer: Self-pay

## 2020-07-27 NOTE — Telephone Encounter (Signed)
Patients daughter [name] left a voicemail for return of call regarding her mother. Called and left voicemail for patients daughter to call office back.

## 2020-08-01 ENCOUNTER — Telehealth: Payer: Self-pay | Admitting: *Deleted

## 2020-08-01 DIAGNOSIS — J9611 Chronic respiratory failure with hypoxia: Secondary | ICD-10-CM

## 2020-08-01 NOTE — Telephone Encounter (Signed)
If she didn't tolerate the buspar then she did the right thing by d/c'ing it.  I added it to her intolerance list.  And given all of that, palliative care if reasonable.  I put in the referral and I will check on that in the meantime.  Thanks.

## 2020-08-01 NOTE — Telephone Encounter (Signed)
Patient's daughter [name] called stating that her mom has been on Buspirone for depression. [name] stated that her mom was having all the side effects listed on the medication so patient stopped it Thursday or Friday. [name] stated that her mom is depressed and the Buspirone made it worse. [name] stated that her mom wanted to sleep all the time. [name] stated that a person at her church suggested that her mom be put on palliative care because her mom is getting worse. [name] stated that her mom gets real angry with her and is very agitated a lot. [name] stated that she thinks that her mom has dementia. Patient's daughter wants to know if Dr. Damita Dunnings can referred her for palliative care?

## 2020-08-02 MED ORDER — MIRTAZAPINE 7.5 MG PO TABS: 7.5000 mg | ORAL_TABLET | Freq: Every day | ORAL | 1 refills | Status: DC

## 2020-08-02 NOTE — Telephone Encounter (Signed)
Spoke with patients daughter [name] about the buspar and referral. She does want to know if there is something else patient can take to replace the buspar?

## 2020-08-02 NOTE — Telephone Encounter (Signed)
I would try tapering down on trazodone over the next few nights and add on remeron at night.  I would still continue the cymbalta.  I sent the rx for remeron.  Thanks.

## 2020-08-02 NOTE — Telephone Encounter (Signed)
Left message for [name] to call back.

## 2020-08-02 NOTE — Telephone Encounter (Signed)
Patient's daughter [name] left a voicemail stating that she was returning a call to [place] and requested a call back.

## 2020-08-02 NOTE — Telephone Encounter (Signed)
Spoke with [name] and she is aware of new rx and to start tapering down on the trazodone. Advised to call if patient has any issues with medication change.

## 2020-08-03 ENCOUNTER — Telehealth: Payer: Self-pay

## 2020-08-03 NOTE — Telephone Encounter (Signed)
Please sign and close encounter when completed. 

## 2020-08-03 NOTE — Telephone Encounter (Signed)
Attempted to contact patient's daughter [name] to schedule a Palliative Care consult appointment. No answer left a message to return call.

## 2020-08-04 ENCOUNTER — Telehealth: Payer: Self-pay

## 2020-08-04 NOTE — Telephone Encounter (Signed)
Spoke with patient's daughter [name] and scheduled an in-person Palliative Consult for 08/12/20 @ 1PM  COVID screening was negative. but patient was positive approximately one month ago and admitted at [facility]. No pets in home. Patient's daughter is staying with her since she was discharged from the hospital, but [name] will be going home today.  Consent obtained; updated Outlook/Netsmart/Team List and Epic.  Family is aware they will be receiving a call from NP the day before or day of to confirm appointment.

## 2020-08-11 ENCOUNTER — Telehealth: Payer: Self-pay

## 2020-08-11 DIAGNOSIS — J449 Chronic obstructive pulmonary disease, unspecified: Secondary | ICD-10-CM | POA: Diagnosis not present

## 2020-08-11 NOTE — Telephone Encounter (Signed)
Pt said she got a call that she had appt at 1pm on 08/12/20 and she will not be able to keep that appt and wants appt cancelled.  I advised pt she did not have appt at [facility] 08/12/20 at 1 pm. Pt said she wants to cancel palliative care appt at 1 tomorrow. I am not sure contact person or phone # to contact with palliative care to discuss cancelling this appt. See palliative note on 08/04/20 and I advised pt that palliative care had spoken with pts daughter [name] and pt will call [name] to cancel that appt. Sending to Dr Damita Dunnings as Juluis Rainier.

## 2020-08-12 ENCOUNTER — Other Ambulatory Visit: Payer: Self-pay | Admitting: Family Medicine

## 2020-08-12 ENCOUNTER — Other Ambulatory Visit: Payer: Self-pay

## 2020-08-12 ENCOUNTER — Other Ambulatory Visit: Payer: Self-pay | Admitting: Student

## 2020-08-12 NOTE — Telephone Encounter (Signed)
Noted. Thanks.

## 2020-09-08 DIAGNOSIS — J449 Chronic obstructive pulmonary disease, unspecified: Secondary | ICD-10-CM | POA: Diagnosis not present

## 2020-09-09 ENCOUNTER — Other Ambulatory Visit: Payer: Self-pay

## 2020-09-09 NOTE — Telephone Encounter (Signed)
Patient needs refill on Alprazolam 1 mg tablets  LOV - 07/19/20  Next OV - not scheduled Last refilled - 07/20/20 #45/1  Send to [place]

## 2020-09-10 MED ORDER — ALPRAZOLAM 1 MG PO TABS: 0.5000 mg | ORAL_TABLET | Freq: Three times a day (TID) | ORAL | 1 refills | Status: DC | PRN

## 2020-09-10 NOTE — Telephone Encounter (Signed)
Sent. Thanks.   

## 2020-09-13 ENCOUNTER — Other Ambulatory Visit: Payer: Self-pay

## 2020-09-20 ENCOUNTER — Telehealth: Payer: Self-pay

## 2020-09-20 NOTE — Telephone Encounter (Signed)
Attempted to contact patient to reschedule Palliative Care consult appointment. No answer and unable to leave a message voicemail is full.

## 2020-09-23 ENCOUNTER — Telehealth: Payer: Self-pay

## 2020-09-23 NOTE — Telephone Encounter (Signed)
Attempted to contact patient to reschedule Palliative Care consult appointment. No answer and unable to leave a message voicemail is full. Left a message for Tina(Patient's POA) to return my call.

## 2020-09-26 ENCOUNTER — Telehealth: Payer: Self-pay

## 2020-09-26 NOTE — Telephone Encounter (Signed)
Third attempt to contact patient's POA to reschedule a Palliative Care consult appointment. No answer left a message to return call by 3/22 if possible. If no return call by 3/22 will cancel referral and notify PCP.

## 2020-09-28 ENCOUNTER — Telehealth: Payer: Self-pay

## 2020-09-28 NOTE — Telephone Encounter (Signed)
See below.  Please see if you can establish contact with patient later this week.  See if she wants to proceed with palliative care referral.  Thanks.

## 2020-09-28 NOTE — Telephone Encounter (Signed)
After several attempts to contact patient's to reschedule a Palliative Care consult appointment. No answer left a message to return call. No response. Will cancel referral and notify PCP.

## 2020-09-29 NOTE — Telephone Encounter (Signed)
Tried to call patient twice but number rings and then cuts off. Will try again tomorrow.

## 2020-09-30 NOTE — Telephone Encounter (Signed)
Patient returned your call. Please call her back.EM

## 2020-09-30 NOTE — Telephone Encounter (Signed)
Spoke with patient and she does not want the palliative care referral anymore. She states she is being helped by her daughter right now and doesn't want anyone else in her house right now.

## 2020-10-02 NOTE — Telephone Encounter (Signed)
Noted. Thanks.

## 2020-10-04 ENCOUNTER — Other Ambulatory Visit: Payer: Self-pay | Admitting: Family Medicine

## 2020-10-05 ENCOUNTER — Telehealth: Payer: Self-pay

## 2020-10-05 ENCOUNTER — Other Ambulatory Visit: Payer: Self-pay

## 2020-10-05 MED ORDER — LEVOTHYROXINE SODIUM 112 MCG PO TABS: ORAL_TABLET | ORAL | 1 refills | Status: DC

## 2020-10-05 MED ORDER — ANASTROZOLE 1 MG PO TABS: 1.0000 mg | ORAL_TABLET | Freq: Every day | ORAL | 4 refills | Status: DC

## 2020-10-05 NOTE — Telephone Encounter (Signed)
Rx sent 

## 2020-10-05 NOTE — Telephone Encounter (Signed)
  LAST APPOINTMENT DATE: 10/04/2020   NEXT APPOINTMENT DATE:@Visit  date not found  MEDICATION:levothyroxine (SYNTHROID) 112 MCG tablet  PHARMACY: [facility]- [place], [place] - [place], [place] [address]  Comments: Patient is completely out   Please advise

## 2020-10-09 DIAGNOSIS — J449 Chronic obstructive pulmonary disease, unspecified: Secondary | ICD-10-CM | POA: Diagnosis not present

## 2020-10-18 ENCOUNTER — Other Ambulatory Visit: Payer: Self-pay

## 2020-10-18 ENCOUNTER — Telehealth: Payer: Self-pay

## 2020-10-18 DIAGNOSIS — C50411 Malignant neoplasm of upper-outer quadrant of right female breast: Secondary | ICD-10-CM

## 2020-10-18 NOTE — Telephone Encounter (Signed)
Patient called in and stated that she is having generalized pain that has been on going for a while D/T her fibromyalgia. Patient is wanting to know if Dr. Damita Dunnings is able to prescribe anything to help her with this? Patient stated that she tried Tramadol before and this does not help with her pain. Please advise.

## 2020-10-18 NOTE — Progress Notes (Incomplete)
[place]  Telephone:([phone] [phone]) [phone]     ID: [name] DOB: 25-Jun-1931  MR#: [id number]  OZH#:[id number]  Patient Care Team: Tonia Ghent, MD as PCP - General Tamala Julian Lynnell Dike, MD as PCP - Cardiology (Cardiology) Magrinat, Virgie Dad, MD as Consulting Physician (Oncology) [name], MD as Consulting Physician (Otolaryngology) Marshell Garfinkel, MD as Consulting Physician (Pulmonary Disease) Aurea Graff OTHER MD: I connected with [name] on 09/21/19 at  1:30 PM EDT by telephone visit and verified that I am speaking with the correct person using two identifiers.   I discussed the limitations, risks, security and privacy concerns of performing an evaluation and management service by telemedicine and the availability of in-person appointments. I also discussed with the patient that there may be a patient responsible charge related to this service. The patient expressed understanding and agreed to proceed.   Other persons participating in the visit and their role in the encounter: daughte [name]  Patient's location: home Provider's location:clinic     CHIEF COMPLAINT: Estrogen receptor positive breast cancer  CURRENT TREATMENT: Anastrozole   INTERVAL HISTORY: [name] was contacted today for follow-up of her estrogen receptor positive breast cancer.   [name] continues on anastrozole.  She has no side effects that she is aware of from this medication including hot flashes.  She is due for annual mammography. Her most recent bilateral diagnostic mammography and right breast ultrasonography was on 09/29/2019 at [facility] and showed: breast density category B; similar to minimal interval decrease in size of right breast mass and previously biopsied right axillary lymph node.  She was taken to the ED on 07/07/2020 with generalized weakness after being found on the floor by her son. At that time, she also reported  increased coughing and shortness of breath, as well as possible Covid exposure. She was found to be Covid positive and was admitted.   REVIEW OF SYSTEMS: [name]    COVID 60 VACCINATION STATUS: [place] x2, most recently 08/2019; infection 07/07/2020   HISTORY OF CURRENT ILLNESS: From the original intake note:  The patient noted a change in the upper outer right breast, as well as a palpable lump in the right axilla, sometime mid-to-late 2019.  She did discuss it briefly with her husband but he was not in good health and she did not want to worry him. She had not had a prior mammogram for better than 2 decades.  More recently she brought this to medical attention and on 09/01/2018 she underwent bilateral diagnostic mammography with tomography and bilateral breast and axillae ultrasonography at the breast center.  This found the breast density to be category B.  In the upper right breast there was an irregular mass with architectural distortion and 2 possible satellite masses.  In the right axilla there were 2 enlarged lymph nodes.  By physical exam there was a firm irregular mass in the upper outer quadrant of the right breast and a smooth easily palpable mass in the right axilla.  Ultrasound of the right breast confirmed a 4.2 cm irregular hypoechoic mass centered at 11:00.  There was no satellite mass seen.  In the right axilla there were 2 enlarged lymph nodes, as well as 1 with normal size but abnormal morphology.  The palpable enlarged lymph nodes measure 2.9 and 3.1 cm.  The third node measured 0.9 cm but had no hilar fat.  Biopsy of the right breast upper outer quadrant mass on 09/05/2018 showed (SAA  [id number]) invasive ductal carcinoma, grade 2, estrogen receptor 95% positive with strong staining intensity, estrogen receptor negative, HER-2 equivocal by immunohistochemistry but negative by FISH with a signals ratio of 1.16 and copy number per cell 1.80.  The MIB-1 was 15%  The right axillary lymph  node biopsied was also positive for carcinoma.  On the left side there was a lucent round circumscribed mass in the upper outer quadrant.  By ultrasound this proved to be a fat necrosis oil cyst requiring no further follow-up  The patient's subsequent history is as detailed below.   PAST MEDICAL HISTORY: Past Medical History:  Diagnosis Date  . Abdominal pain 04/05/2017  . Allergic rhinitis, cause unspecified   . Allergy, unspecified not elsewhere classified   . Anemia, unspecified 04/10/2008   Qualifier: Diagnosis of  By: Linna Darner MD, Gwyndolyn Saxon    . Anxiety   . Anxiety state 03/01/2008   Qualifier: Diagnosis of  By: Council Mechanic MD, Hilaria Ota   . Asthma   . Breast cancer ([place]) 08/2018   right breast  . Breast mass 08/28/2018  . Chronic kidney disease    frequency  . COMMON MIGRAINE 12/24/2006   Qualifier: Diagnosis of  By: Fuller Plan CMA (AAMA), [place]    . COPD (chronic obstructive pulmonary disease) ([place])   . Degeneration of intervertebral disc, site unspecified   . Diverticulosis of colon (without mention of hemorrhage)   . DNR (do not resuscitate) 09/08/2019  . Dysphagia 02/18/2019  . Encounter for chronic pain management 07/26/2016   Indication for chronic opioid: chronic back pain Medication and dose:  Oxycodone 74m TID # pills per month: 90 Last UDS date: 04/15/15 Pain contract signed (Y/N):  yes Date narcotic database last reviewed (include red flags):  05/06/17  . Esophageal reflux   . Fibromyalgia 12/24/2006   Qualifier: Diagnosis of  By: FFuller PlanCMA (AAMA), [place]    . Headache   . HTN (hypertension) 06/09/2013  . Hyperlipidemia with target LDL less than 70 12/24/2006   Qualifier: Diagnosis of  By: FFuller PlanCMA (AAMA), [place]    . Hypokalemia 05/24/2009   Qualifier: Diagnosis of  By: CLacretia Nicks   . Hypothyroidism 12/24/2006   Qualifier: Diagnosis of  By: FFuller PlanCMA (AAMA), [place]    . Insomnia 12/24/2006   she had trials of antihistamines and TCAs prev w/o effect.     .  Insomnia, unspecified   . Lower GI bleeding 09/09/2019  . Myalgia and myositis, unspecified   . Need for prophylactic hormone replacement therapy (postmenopausal)   . Osteoarthritis of right knee 08/27/2011   R knee pain, s/p arthroscopy 2012 per [name]   . Osteoporosis 06/23/2007   Qualifier: Diagnosis of  By: SCouncil MechanicMD, RHilaria Ota  . Osteoporosis, unspecified   . Other abnormal blood chemistry   . Other and unspecified hyperlipidemia   . Other chronic pain   . Presence of drug coated stent in right coronary artery 01/30/2019  . Stroke ([facility]    mini strokes  . Syncope 01/24/2017  . [place]acic aortic aneurysm [place] 04/10/2017  . Unspecified essential hypertension   . Unspecified hypothyroidism   . Vitamin D deficiency 09/26/2016    PAST SURGICAL HISTORY: Past Surgical History:  Procedure Laterality Date  . BACK SURGERY    . breast cystectomy    . CORONARY/GRAFT ACUTE MI REVASCULARIZATION N/A 01/29/2019   Procedure: CORONARY/GRAFT ACUTE MI REVASCULARIZATION;  Surgeon: SBelva Crome MD;  [facility];  Service: Cardiovascular;  Laterality: N/A;  .  JOINT REPLACEMENT     shoulder right  . KNEE ARTHROSCOPY     Right knee 2012  . MASS EXCISION Left 04/07/2015   Procedure: MINOR EXCISION OF MASS LEFT SMALL FINGER;  Surgeon: Leanora Cover, MD;  Location: [facility];  Service: Orthopedics;  Laterality: Left;  . SHOULDER SURGERY    . TOTAL KNEE ARTHROPLASTY  12/11/2011   Procedure: TOTAL KNEE ARTHROPLASTY;  Surgeon: Johnny Bridge, MD;  Location: [facility];  Service: Orthopedics;  Laterality: Right;  . UMBILICAL HERNIA REPAIR N/A 06/09/2013   Procedure: EXPLORATIORY LAPAROTOMY, HERNIA REPAIR UMBILICAL, INSERTION OF MESH;  Surgeon: Haywood Lasso, MD;  Location[facility];  Service: General;  Laterality: N/A;    FAMILY HISTORY Family History  Problem Relation Age of Onset  . Lung cancer Brother        x2  . Hypertension Father   . Heart disease Father    . Heart disease Mother   . Asthma Sister   The patient's father died from a heart attack at age 73.  The patient's mother died suddenly at age 56 while in apparent good health.  The patient is the youngest of 6 siblings, the other ones all deceased.  One sister developed breast cancer in her 19s.  There is no other family history of breast, ovarian, pancreatic, or prostate cancer to the patient's knowledge   GYNECOLOGIC HISTORY:  No LMP recorded. Patient is postmenopausal. Menarche: 87 years old Age at first live birth: 87 years old [place] P 2 LMP approximately mid 21s HRT treated with estropitate ([place]) for "many years" Hysterectomy?  No Salpingo-oophorectomy?  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  Her husband was a high school principal.  He is now deceased.  The patient has 2 daughters, Tomi Bamberger "[name]" [place], who lives in [place] and is a retired Paediatric nurse; and Verenise Moulin, who works for [employer] and recently moved to [place].  The patient has 4 grandchildren.  One grandson unfortunately died in a motor vehicle accident.  The patient attends [employer].  Jailen generally lives by herself but currently her daughter [name] is staying with her.   ADVANCED DIRECTIVES: The patient has named both her daughters as healthcare powers of attorney but currently daughter [name] would be first since she is the one in town   HEALTH MAINTENANCE: Social History   Tobacco Use  . Smoking status: Former Smoker    Packs/day: 1.50    Years: 10.00    Pack years: 15.00    Types: Cigarettes    Quit date: 07/09/1985    Years since quitting: 35.3  . Smokeless tobacco: Never Used  . Tobacco comment: 1 1/2 ppd x 20 years  Substance Use Topics  . Alcohol use: No    Alcohol/week: 0.0 standard drinks  . Drug use: No     Colonoscopy: n/a  PAP: n/a  Bone density: Remote   Allergies  Allergen Reactions  . Buspar [Buspirone]      intolerant  . Doxycycline Other (See Comments)    GI upset  . Sulfadiazine Other (See Comments)    REACTION: Fever, aches  . Sulfa Antibiotics Rash    Current Outpatient Medications  Medication Sig Dispense Refill  . albuterol (PROAIR HFA) 108 (90 Base) MCG/ACT inhaler Inhale 1-2 puffs into the lungs every 6 (six) hours as needed for wheezing or shortness of breath (Okay to fill with Ventolin or  albuterol HFA). 18 g 3  . ALPRAZolam (XANAX) 1 MG tablet Take 0.5 tablets (0.5 mg total) by mouth 3 (three) times daily as needed for anxiety. 45 tablet 1  . anastrozole (ARIMIDEX) 1 MG tablet Take 1 tablet (1 mg total) by mouth daily. 90 tablet 4  . ascorbic acid (VITAMIN C) 500 MG tablet Take 1 tablet (500 mg total) by mouth daily. 30 tablet 0  . Calcium Carbonate (CALTRATE 600 PO) Take 600 mg by mouth daily.    . cholecalciferol (VITAMIN D) 1000 units tablet Take 1 tablet (1,000 Units total) by mouth daily.    . clopidogrel (PLAVIX) 75 MG tablet Take 1 tablet (75 mg total) by mouth daily. 90 tablet 2  . diclofenac sodium (VOLTAREN) 1 % GEL APPLY 4 GRAMS TO AFFECTED AREA 2 TIMES A DAY. (Patient taking differently: Apply 2 g topically daily as needed (For pain).) 200 g 3  . DULoxetine (CYMBALTA) 60 MG capsule TAKE ONE CAPSULE BY MOUTH DAILY 90 capsule 1  . ferrous sulfate (FERROUSUL) 325 (65 FE) MG tablet Take 1 tablet (325 mg total) by mouth daily with breakfast.    . fluticasone (FLONASE) 50 MCG/ACT nasal spray PLACE 2 SPRAYS INTO BOTH NOSTRILS DAILY. 16 g 1  . hydroxypropyl methylcellulose / hypromellose (ISOPTO TEARS / GONIOVISC) 2.5 % ophthalmic solution Place 1 drop into both eyes 3 (three) times daily as needed for dry eyes.    [place] Kitchen levothyroxine (SYNTHROID) 112 MCG tablet NEW PRESCRIPTION REQUEST: TAKE ONE TABLET BY MOUTH DAILY 90 tablet 1  . mirtazapine (REMERON) 7.5 MG tablet Take 1 tablet (7.5 mg total) by mouth at bedtime. 30 tablet 1  . Multiple Vitamin (MULTIVITAMIN WITH MINERALS) TABS  tablet Take 1 tablet by mouth daily.    . Multiple Vitamins-Minerals (OCUVITE PRESERVISION PO) Take 1 tablet by mouth daily.    . nitroGLYCERIN (NITROSTAT) 0.4 MG SL tablet Place 1 tablet (0.4 mg total) under the tongue every 5 (five) minutes as needed for chest pain. 25 tablet 6  . OXYGEN Inhale 3-3.5 L into the lungs continuous.    . pantoprazole (PROTONIX) 20 MG tablet Take 1 tablet (20 mg total) by mouth 2 (two) times daily. 180 tablet 1  . rosuvastatin (CRESTOR) 10 MG tablet TAKE ONE TABLET BY MOUTH DAILY (Patient taking differently: Take 10 mg by mouth daily.) 270 tablet 0  . tiotropium (SPIRIVA HANDIHALER) 18 MCG inhalation capsule PLACE 1 CAPSULE INTO INHALER AND INHALE BY MOUTH DAILY (Patient taking differently: Place 1 capsule into inhaler and inhale daily.) 90 capsule 1  . zinc sulfate 220 (50 Zn) MG capsule Take 1 capsule (220 mg total) by mouth daily. 30 capsule 0   No current facility-administered medications for this visit.    OBJECTIVE: Televisit  There were no vitals filed for this visit. Wt Readings from Last 3 Encounters:  07/19/20 132 lb (59.9 kg)  07/11/20 131 lb 9.8 oz (59.7 kg)  02/03/20 130 lb (59 kg)   There is no height or weight on file to calculate BMI.    ECOG FS:2 - Symptomatic, <50% confined to bed  Sclerae unicteric, EOMs intact Wearing a mask No cervical or supraclavicular adenopathy Lungs no rales or rhonchi Heart regular rate and rhythm Abd soft, nontender, positive bowel sounds MSK no focal spinal tenderness, no upper extremity lymphedema Neuro: nonfocal, well oriented, appropriate affect Breasts:    LAB RESULTS:  CMP     Component Value Date/Time   NA 139 07/11/2020 0416   NA  144 02/05/2019 1019   K 3.9 07/11/2020 0416   CL 97 (L) 07/11/2020 0416   CO2 32 07/11/2020 0416   GLUCOSE 132 (H) 07/11/2020 0416   BUN 18 07/11/2020 0416   BUN 13 02/05/2019 1019   CREATININE 0.61 07/11/2020 0416   CREATININE 0.72 04/28/2019 1503    CREATININE 0.62 08/22/2018 1625   CALCIUM 8.8 (L) 07/11/2020 0416   PROT 6.5 07/11/2020 0416   PROT 6.6 03/19/2019 1105   ALBUMIN 3.4 (L) 07/11/2020 0416   ALBUMIN 4.1 03/19/2019 1105   AST 51 (H) 07/11/2020 0416   AST 29 04/28/2019 1503   ALT 20 07/11/2020 0416   ALT 9 04/28/2019 1503   ALKPHOS 31 (L) 07/11/2020 0416   BILITOT 0.4 07/11/2020 0416   BILITOT 0.5 04/28/2019 1503   GFRNONAA >60 07/11/2020 0416   GFRNONAA >60 04/28/2019 1503   GFRAA >60 09/14/2019 1356   GFRAA >60 04/28/2019 1503    No results found for: TOTALPROTELP, ALBUMINELP, A1GS, A2GS, BETS, BETA2SER, GAMS, MSPIKE, SPEI  No results found for: KPAFRELGTCHN, LAMBDASER, KAPLAMBRATIO  Lab Results  Component Value Date   WBC 4.2 07/11/2020   NEUTROABS 3.5 07/11/2020   HGB 9.9 (L) 07/11/2020   HCT 30.6 (L) 07/11/2020   MCV 100.7 (H) 07/11/2020   PLT 143 (L) 07/11/2020      Chemistry      Component Value Date/Time   NA 139 07/11/2020 0416   NA 144 02/05/2019 1019   K 3.9 07/11/2020 0416   CL 97 (L) 07/11/2020 0416   CO2 32 07/11/2020 0416   BUN 18 07/11/2020 0416   BUN 13 02/05/2019 1019   CREATININE 0.61 07/11/2020 0416   CREATININE 0.72 04/28/2019 1503   CREATININE 0.62 08/22/2018 1625      Component Value Date/Time   CALCIUM 8.8 (L) 07/11/2020 0416   ALKPHOS 31 (L) 07/11/2020 0416   AST 51 (H) 07/11/2020 0416   AST 29 04/28/2019 1503   ALT 20 07/11/2020 0416   ALT 9 04/28/2019 1503   BILITOT 0.4 07/11/2020 0416   BILITOT 0.5 04/28/2019 1503      No results found for: LABCA2  No components found for: WNUUVO536  No results for input(s): INR in the last 168 hours.  No results found for: LABCA2  No results found for: UYQ034  No results found for: CAN125  No results found for: VQQ595  No results found for: CA2729  No components found for: HGQUANT  No results found for: CEA1 / No results found for: CEA1   No results found for: AFPTUMOR  No results found for: CHROMOGRNA  No  results found for: HGBA, HGBA2QUANT, HGBFQUANT, HGBSQUAN (Hemoglobinopathy evaluation)   Lab Results  Component Value Date   LDH 148 07/07/2020    Lab Results  Component Value Date   IRON 56 07/10/2020   TIBC 297 07/10/2020   IRONPCTSAT 19 07/10/2020   (Iron and TIBC)  Lab Results  Component Value Date   FERRITIN 32 07/11/2020    Urinalysis    Component Value Date/Time   COLORURINE YELLOW 09/14/2019 1701   APPEARANCEUR CLEAR 09/14/2019 1701   LABSPEC 1.020 09/14/2019 1701   PHURINE 6.0 09/14/2019 1701   GLUCOSEU NEGATIVE 09/14/2019 1701   GLUCOSEU NEGATIVE 10/24/2016 1429   HGBUR NEGATIVE 09/14/2019 1701   BILIRUBINUR NEGATIVE 09/14/2019 1701   BILIRUBINUR negative 09/20/2016 1625   KETONESUR NEGATIVE 09/14/2019 1701   PROTEINUR NEGATIVE 09/14/2019 1701   UROBILINOGEN 0.2 10/24/2016 1429   NITRITE NEGATIVE 09/14/2019  [place] 09/14/2019 1701    STUDIES: No results found.   ELIGIBLE FOR AVAILABLE RESEARCH PROTOCOL: no  ASSESSMENT: 87 y.o. [place] woman status post right breast upper outer quadrant biopsy 09/05/2018 for a clinical T2 N1, stage IIB invasive ductal carcinoma, grade 2, estrogen receptor positive, progesterone receptor negative, HER-2 not amplified, with an MIB-1 of 15%.  (1) anastrozole started 10/09/2018  (a) bone density 02/24/2019 shows osteoporosis with a T score of -4.4  (b) Fosamax/alendronate started 03/10/2019  (2) patient refuses surgery   PLAN: [name] is now just over a year out from initial diagnosis of breast cance.  She is being treated with anastrozole and secondarily with alendronate/Fosamax.  She is tolerating these well.  She is already scheduled for repeat mammography 09/29/2019.  She had a chest CT and brain scan recently after her emergency room visits.  These do not show evidence of metastatic disease and in fact the right axillary lymph nodes appear smaller.  At this point the plan is to continue the  current medications.  The patient will see me again in a year from now after her next mammogram but if there is any untoward finding on the upcoming mammography we can work her in before then  They know to call for any other issue that may develop before then.   Magrinat, Virgie Dad, MD  10/18/20 8:01 PM Medical Oncology and Hematology [facility] [place], [place] [address] Tel. [phone]    Fax. [phone]   I, Wilburn Mylar, am acting as scribe for Dr. Virgie Dad. Magrinat.  I, Lurline Del MD, have reviewed the above documentation for accuracy and completeness, and I agree with the above.   *Total Encounter Time as defined by the Centers for Medicare and Medicaid Services includes, in addition to the face-to-face time of a patient visit (documented in the note above) non-face-to-face time: obtaining and reviewing outside history, ordering and reviewing medications, tests or procedures, care coordination (communications with other health care professionals or caregivers) and documentation in the medical record.

## 2020-10-19 ENCOUNTER — Inpatient Hospital Stay: Payer: Medicare PPO | Admitting: Oncology

## 2020-10-19 ENCOUNTER — Inpatient Hospital Stay: Payer: Medicare PPO

## 2020-10-19 ENCOUNTER — Telehealth: Payer: Self-pay | Admitting: Oncology

## 2020-10-19 NOTE — Telephone Encounter (Signed)
I have been considering options in the meantime.  I think it would make a lot of sense to set up a visit first.  In person would be preferred but I realize that may not be feasible or easy for her.  Please see what she is able to do and we can go from there.  I have not seen her recently and we need to go over her medications/situation.  I think that would be best.  Thanks.

## 2020-10-19 NOTE — Telephone Encounter (Signed)
I would like to do what ever is feasible and best for the patient.  If a video visit would work then I support that.  She is a kind lady and I would like to do whatever is going to work for her.  Thanks.

## 2020-10-19 NOTE — Telephone Encounter (Signed)
Cancelled appts per 4/13 sch msg. Called pt, no answer. Left msg for pt to call back if wanting to r/s.

## 2020-10-19 NOTE — Telephone Encounter (Signed)
Spoke with patient and tried to get her an in person visit set up but patient refused. Patient states she is in so much pain and can hardly walk. Her daughter was there with her and asked if we could possibly do a VV; I advised you really wanted to see patient in person but we can see what we can do. I told her it had been since 03/21 since she had been in the office for an appt and she stated she rarely leaves the house and doesn't think she could make in to the office. Would you be okay with doing a VV?

## 2020-10-20 NOTE — Telephone Encounter (Signed)
VV made for 11/01/20 at 12:30 pm

## 2020-10-27 ENCOUNTER — Telehealth: Payer: Self-pay | Admitting: *Deleted

## 2020-10-27 NOTE — Telephone Encounter (Signed)
Received a call from [name] (daughter) stating she is in town visiting her mom and feels her mom's health has declined.  [name] has a virtual visit with Dr. Damita Dunnings on 11/01/2020 at 12:30 pm to discuss pain.  [name] is requesting a call back from Dr. Damita Dunnings or his assistant to discuss possible assisted living for her mom. [name] is aware that Dr. Damita Dunnings is out of the office this week but will forward message to Janett Billow to return call to [place].  Best Number to reach [name] [phone].

## 2020-10-28 NOTE — Telephone Encounter (Signed)
Spoke with patients daughter [name]; she would really like to discuss/try to get her mom into assisted living. Home health caregivers in the past have not worked out because patient fires them all or locks them out of the house. [name] states patient can no longer take care of herself and is not doing well. I encouraged her to try to get her to come in the office if possible. I advised daughter that when I made the VV appt with Dr. Damita Dunnings she refused to come in the office because it was hard for her to move around. She is going to talk to her sister about trying to bring her in to the office for a full workup as she will be back home before patients appt. She is going to call back to switch appt over if they decide to bring her in vs the VV appt.

## 2020-10-31 ENCOUNTER — Telehealth: Payer: Self-pay

## 2020-10-31 NOTE — Telephone Encounter (Signed)
[place] Night - Client Nonclinical Telephone Record AccessNurse Client [place] Night - Client Client Site [facility] Physician Renford Dills - MD Contact Type Call Who Is Calling Patient / Member / Family / Caregiver Caller Name [name] Caller Phone Number [phone] Patient Name [name] Patient DOB Jul 29, 1930 Call Type Message Only Information Provided Reason for Call Request for General Office Information Initial Comment Caller has an appt on Tuesday the 25th and she wants to make it a virtual appt. Disp. Time Disposition Final User 10/29/2020 3:06:06 PM General Information Provided Yes [name] Call Closed By: [name] Transaction Date/Time: 10/29/2020 3:02:50 PM (ET)

## 2020-11-01 ENCOUNTER — Telehealth (INDEPENDENT_AMBULATORY_CARE_PROVIDER_SITE_OTHER): Payer: Medicare PPO | Admitting: Family Medicine

## 2020-11-01 ENCOUNTER — Other Ambulatory Visit: Payer: Self-pay

## 2020-11-01 ENCOUNTER — Encounter: Payer: Self-pay | Admitting: Family Medicine

## 2020-11-01 VITALS — Ht 64.0 in

## 2020-11-01 DIAGNOSIS — M810 Age-related osteoporosis without current pathological fracture: Secondary | ICD-10-CM | POA: Diagnosis not present

## 2020-11-01 DIAGNOSIS — G894 Chronic pain syndrome: Secondary | ICD-10-CM | POA: Diagnosis not present

## 2020-11-01 DIAGNOSIS — J9611 Chronic respiratory failure with hypoxia: Secondary | ICD-10-CM | POA: Diagnosis not present

## 2020-11-01 DIAGNOSIS — J9621 Acute and chronic respiratory failure with hypoxia: Secondary | ICD-10-CM

## 2020-11-01 DIAGNOSIS — E785 Hyperlipidemia, unspecified: Secondary | ICD-10-CM

## 2020-11-01 NOTE — Progress Notes (Addendum)
Interactive audio and video telecommunications were attempted between this provider and patient, however failed, due to patient having technical difficulties OR patient did not have access to video capability.  We continued and completed visit with audio only.   Virtual Visit via Telephone Note  I connected with patient on 11/01/20  at 12:57 PM  by telephone and verified that I am speaking with the correct person using two identifiers.  Location of patient:  Home  Location of MD: [facility] Alice of referring provider (if blank then none associated): Names per persons and role in encounter:  MD: Earlyne Iba, Patient: [name].    I discussed the limitations, risks, security and privacy concerns of performing an evaluation and management service by telephone and the availability of in person appointments. I also discussed with the patient that there may be a patient responsible charge related to this service. The patient expressed understanding and agreed to proceed.  CC: follow up.    History of Present Illness:  Family had asked patient about SNF placement. Her preference would be to stay at home.  She has pain at baseline, esp in back and hips. Tramadol didn't help with pain.  She is taking tylenol at baseline for pain.    On O2 at baseline, w/o exacerbation recently.  H/o COVID 06/2021.  Speaking in complete sentences.  No wheeze.    I need clarification on her med list and she gave me the okay to talk with her daughter about that.  We also need to see about getting car side lab set up so that we can see about options regarding pain medication/pain treatment.   We talked about potential follow-up imaging regarding her aorta.  She doesn't want intervention re: aorta.  Per patient "I'd rather die right here" then have any type of corrective surgery.  Given that it makes sense not to get follow-up imaging done.  Patient agrees.  Observations/Objective: No apparent  distress Speaking in complete sentences  Assessment and Plan:  History of aortic abnormality, patient does not want follow-up imaging or intervention Chronic pain.  We need to set up surveillance labs and then see about options.  Reasonable to continue Tylenol in the meantime along with a baseline medications listed under orders. Hypoxia at baseline, treated with O2 via nasal cannula.  Would continue as is. Housing consideration.  Patient wants to stay at home.  She would prefer that instead of SNF placement.  Patient gave [place] permission to talk with her daughters.  We will check with them, verify her med list, set up follow-up labs, and go from there.  Follow Up Instructions: see [name].     I discussed the assessment and treatment plan with the patient. The patient was provided an opportunity to ask questions and all were answered. The patient agreed with the plan and demonstrated an understanding of the instructions.   The patient was advised to call back or seek an in-person evaluation if the symptoms worsen or if the condition fails to improve as anticipated.  I provided 25 minutes of non-face-to-face time during this encounter.  Elsie Stain, MD

## 2020-11-02 ENCOUNTER — Telehealth: Payer: Self-pay | Admitting: Family Medicine

## 2020-11-02 DIAGNOSIS — E039 Hypothyroidism, unspecified: Secondary | ICD-10-CM

## 2020-11-02 NOTE — Assessment & Plan Note (Signed)
See above

## 2020-11-02 NOTE — Telephone Encounter (Signed)
Please check with her daughter [name].  Patient gave [place] permission to speak with her. Please verify her current medication list.  Needs f/u labs- carside labs-please set that up with lab.  It is difficult for the patient to come into the clinic but she can get into the car and come to the parking lot.  Labs ordered.  We can see about pain medication options after I get her follow-up labs.    We talked about placement versus living at home.  Patient still wants to stay at home.  I think this may be an ongoing conversation.  Patient absolutely did not want to have surgery for any reason and she did not want to have any more follow-up imaging on her aorta.  I think the patient has reasonable wishes and we opted not to recheck a CAT scan regarding her aorta.  Thanks.

## 2020-11-03 ENCOUNTER — Other Ambulatory Visit (INDEPENDENT_AMBULATORY_CARE_PROVIDER_SITE_OTHER): Payer: Medicare PPO

## 2020-11-03 DIAGNOSIS — M810 Age-related osteoporosis without current pathological fracture: Secondary | ICD-10-CM

## 2020-11-03 DIAGNOSIS — E785 Hyperlipidemia, unspecified: Secondary | ICD-10-CM | POA: Diagnosis not present

## 2020-11-03 LAB — VITAMIN D 25 HYDROXY (VIT D DEFICIENCY, FRACTURES): VITD: 31.08 ng/mL (ref 30.00–100.00)

## 2020-11-03 LAB — COMPREHENSIVE METABOLIC PANEL
ALT: 12 U/L (ref 0–35)
AST: 30 U/L (ref 0–37)
Albumin: 3.7 g/dL (ref 3.5–5.2)
Alkaline Phosphatase: 36 U/L — ABNORMAL LOW (ref 39–117)
BUN: 16 mg/dL (ref 6–23)
CO2: 40 mEq/L — ABNORMAL HIGH (ref 19–32)
Calcium: 8.8 mg/dL (ref 8.4–10.5)
Chloride: 100 mEq/L (ref 96–112)
Creatinine, Ser: 0.59 mg/dL (ref 0.40–1.20)
GFR: 80.86 mL/min (ref >60.00)
Glucose, Bld: 90 mg/dL (ref 70–99)
Potassium: 4 mEq/L (ref 3.5–5.1)
Sodium: 143 mEq/L (ref 135–145)
Total Bilirubin: 0.5 mg/dL (ref 0.2–1.2)
Total Protein: 6.4 g/dL (ref 6.0–8.3)

## 2020-11-03 LAB — CBC WITH DIFFERENTIAL/PLATELET
Basophils Absolute: 0 10*3/uL (ref 0.0–0.1)
Basophils Relative: 0.5 % (ref 0.0–3.0)
Eosinophils Absolute: 0.1 10*3/uL (ref 0.0–0.7)
Eosinophils Relative: 2.5 % (ref 0.0–5.0)
HCT: 28.9 % — ABNORMAL LOW (ref 36.0–46.0)
Hemoglobin: 9.4 g/dL — ABNORMAL LOW (ref 12.0–15.0)
Lymphocytes Relative: 16.8 % (ref 12.0–46.0)
Lymphs Abs: 0.7 10*3/uL (ref 0.7–4.0)
MCHC: 32.7 g/dL (ref 30.0–36.0)
MCV: 97.1 fl (ref 78.0–100.0)
Monocytes Absolute: 0.2 10*3/uL (ref 0.1–1.0)
Monocytes Relative: 5.5 % (ref 3.0–12.0)
Neutro Abs: 3.2 10*3/uL (ref 1.4–7.7)
Neutrophils Relative %: 74.7 % (ref 43.0–77.0)
Platelets: 175 10*3/uL (ref 150.0–400.0)
RBC: 2.97 Mil/uL — ABNORMAL LOW (ref 3.87–5.11)
RDW: 14.1 % (ref 11.5–15.5)
WBC: 4.3 10*3/uL (ref 4.0–10.5)

## 2020-11-03 LAB — LIPID PANEL
Cholesterol: 119 mg/dL (ref 0–200)
HDL: 44.1 mg/dL (ref >39.00)
LDL Cholesterol: 55 mg/dL (ref 0–99)
NonHDL: 74.44
Total CHOL/HDL Ratio: 3
Triglycerides: 96 mg/dL (ref 0.0–149.0)
VLDL: 19.2 mg/dL (ref 0.0–40.0)

## 2020-11-03 LAB — TSH: TSH: 10.31 u[IU]/mL — ABNORMAL HIGH (ref 0.35–4.50)

## 2020-11-03 NOTE — Telephone Encounter (Signed)
Left message for [name] to call back.

## 2020-11-03 NOTE — Telephone Encounter (Signed)
Patient was able to come in for labs today.

## 2020-11-03 NOTE — Telephone Encounter (Signed)
Spoke with patient daughter [name] about getting current medication list and labs done. She is going to try to upload a copy of list to mychart if she can figure out how or if not will bring copy with them when they come for labs. Patient was currently sleeping and needs fasting labs done; [name] will call back to schedule once patient gets up and agrees to come for carside labs. It is very difficult for patient to move around. I advised [name] once labs and medications are updated we can further discuss pain med options.

## 2020-11-06 NOTE — Telephone Encounter (Signed)
Please verify her medications. She is still mildly anemic, this is at her baseline.  Her TSH is elevated and we need to adjust her thyroid dosing. Her kidney and liver tests are fine.  Her other labs are unremarkable.  Please let me know about her medication list so we can adjust her thyroid medication and see about pain medication options.  Thanks.

## 2020-11-07 NOTE — Telephone Encounter (Signed)
Left message for patients daughter [name] to call back

## 2020-11-07 NOTE — Telephone Encounter (Signed)
[name] called back and we spoke about patients lab results. She did not have medication list with her yet; she is going over to patients house tomorrow and states she will call back with this. If I am not in the office when she calls back tomorrow please get someone to go over patients medication with her.

## 2020-11-08 DIAGNOSIS — J449 Chronic obstructive pulmonary disease, unspecified: Secondary | ICD-10-CM | POA: Diagnosis not present

## 2020-11-15 NOTE — Addendum Note (Signed)
Addended by: Sherrilee Gilles B on: 11/15/2020 02:42 PM   Modules accepted: Orders

## 2020-11-15 NOTE — Telephone Encounter (Signed)
Patients daughter [name] dropped off patients medication list off. I have updated in the system as well as put hard copy in your inbox.

## 2020-11-16 MED ORDER — DICLOFENAC SODIUM 1 % EX GEL: 2.0000 g | Freq: Four times a day (QID) | CUTANEOUS | 5 refills | Status: DC | PRN

## 2020-11-16 MED ORDER — LEVOTHYROXINE SODIUM 125 MCG PO TABS: ORAL_TABLET | ORAL | 3 refills | Status: DC

## 2020-11-16 NOTE — Addendum Note (Signed)
Addended by: Tonia Ghent on: 11/16/2020 03:21 PM   Modules accepted: Orders

## 2020-11-16 NOTE — Telephone Encounter (Signed)
Thank you for the update regarding her med list.  We need to increase her thyroid replacement.  I would change levothyroxine to 125 mcg daily.  She will still take 1 pill a day but it is the higher strength, up from 112 mcg daily.  Needs recheck TSH in about 3 months at a lab visit.  I would try using over-the-counter diclofenac gel on her lower back and hips to see if that helps with pain.  I sent a prescription in the meantime, in case that is cheaper.  That is the safest option I can think at this point for pain.  Please let me know how it goes with that.  Thanks.

## 2020-11-16 NOTE — Telephone Encounter (Signed)
Left message for [name] to call back.

## 2020-11-17 NOTE — Telephone Encounter (Signed)
Mrs. Simone Curia called and related the message, she stated is there something else [name] can take for pain.

## 2020-11-17 NOTE — Telephone Encounter (Signed)
ATC, unable to leave a voice mail.  

## 2020-11-17 NOTE — Telephone Encounter (Signed)
Everything else that I can think of would increase her risk of bleeding, falls or sedation.  It may be that she needs a low dose of an opiate for pain but I wouldn't try that until she has tried topical diclofenac.  We would need to talk about options re: opiates if that is the next step.  Thanks.

## 2020-11-21 NOTE — Telephone Encounter (Signed)
Left message for [name] to call back.

## 2020-11-23 ENCOUNTER — Other Ambulatory Visit: Payer: Self-pay | Admitting: Family Medicine

## 2020-11-24 NOTE — Telephone Encounter (Signed)
Refill request for Alprazolam 1 mg tablets  LOV - 11/01/20 Next OV - not scheduled Last refill - 09/10/2020 #45/1  Patient also needs refill on trazodone 50 mg tablets.

## 2020-11-27 NOTE — Telephone Encounter (Signed)
Sent. Thanks.   

## 2020-11-29 NOTE — Telephone Encounter (Signed)
[name] called back and states she patient has tried the topical diclofenac as well as tramadol in the past and neither have helped her much with pain.

## 2020-11-29 NOTE — Telephone Encounter (Signed)
LMTCB

## 2020-11-30 NOTE — Telephone Encounter (Signed)
Please see about getting the patient scheduled for a visit with family present.  It would be preferable to do this in person.  If she cannot do it then it would need to be done by phone.  Either way I think it makes sense to do this with family present.  Thanks.

## 2020-12-02 NOTE — Telephone Encounter (Signed)
Spoke with patients daughter [name] and advised her we really need to see patient in person to discuss with her. She agreed and scheduled appt for 12/13/20 at 4:00 pm.

## 2020-12-09 DIAGNOSIS — J449 Chronic obstructive pulmonary disease, unspecified: Secondary | ICD-10-CM | POA: Diagnosis not present

## 2020-12-13 ENCOUNTER — Ambulatory Visit: Payer: Medicare PPO | Admitting: Family Medicine

## 2020-12-20 ENCOUNTER — Other Ambulatory Visit: Payer: Self-pay | Admitting: Family Medicine

## 2021-01-08 DIAGNOSIS — J449 Chronic obstructive pulmonary disease, unspecified: Secondary | ICD-10-CM | POA: Diagnosis not present

## 2021-01-24 ENCOUNTER — Telehealth: Payer: Self-pay | Admitting: *Deleted

## 2021-01-24 MED ORDER — METHOCARBAMOL 500 MG PO TABS: 500.0000 mg | ORAL_TABLET | Freq: Four times a day (QID) | ORAL | 0 refills | Status: DC | PRN

## 2021-01-24 NOTE — Telephone Encounter (Signed)
This could be a muscle spasm but I rec getting checked in person if that is at all possible.  I am not in clinic tomorrow.  She could try robaxin but with sedation caution.  Thanks.

## 2021-01-24 NOTE — Telephone Encounter (Signed)
Spoke with patients daughter [name] (okay per DPR). Advised patient can try robaxin rx and is also recommended to be checked out if possible soon. Patient does not like to come in but [name] will try to get her in if rx does not help her for an eval.

## 2021-01-24 NOTE — Telephone Encounter (Signed)
Patient left a voicemail stating that she has had terrible pain in the back of her head that moves down into her shoulder. Patient stated that she has been using a heating pain and requested a call back.  Called patient back and was advised  by her daughter that her mom is asleep. Patient's daughter [name] stated that her mom woke up yesterday morning complaining of pain in the back of her head at the base of her skull more to the right side. [name] stated that the pain is a level 9-10 and is constant. [name] stated  the only way her mom can get relief from the pain is to sleep. [name] stated that she has been taking tylenol and it is not helping at all. [name] stated that the heating pad may be helping some. Pharmacy [facility]

## 2021-02-03 ENCOUNTER — Telehealth: Payer: Self-pay

## 2021-02-03 DIAGNOSIS — J9611 Chronic respiratory failure with hypoxia: Secondary | ICD-10-CM

## 2021-02-03 NOTE — Telephone Encounter (Signed)
Pt's dtr, [name], called triage line asking for Dr. Damita Dunnings to put in a referral for palliative care for her mom.

## 2021-02-03 NOTE — Telephone Encounter (Signed)
Patient does want palliative care; she asked her daughter to call for this. I advised they will get a call once referral has been placed to get set up. Patient is just not doing as well at home alone and daughter is having trouble being able to be with her more often.

## 2021-02-03 NOTE — Telephone Encounter (Signed)
I can do this- that isn't a problem.  Just make sure patient is aware/in agreement and I'll order it.  Thanks.

## 2021-02-05 NOTE — Addendum Note (Signed)
Addended by: Tonia Ghent on: 02/05/2021 02:56 PM   Modules accepted: Orders

## 2021-02-05 NOTE — Telephone Encounter (Signed)
Thanks.  I put in the referral.

## 2021-02-07 ENCOUNTER — Telehealth: Payer: Self-pay

## 2021-02-07 NOTE — Telephone Encounter (Signed)
Spoke with patient's daughter [name] and scheduled an in-person Palliative Consult for 03/02/21 @ 11:30 AM with Dr. Hollace Kinnier. Documentation will be noted in [place].   COVID screening was negative. No pets in home. Patient lives alone, but daughter is staying with her.   Consent obtained; updated Outlook/Netsmart/Team List and Epic.   Family is aware they may be receiving a call from provider the day before or day of to confirm appointment.

## 2021-02-07 NOTE — Telephone Encounter (Signed)
Attempted to contact patient and patient's daughter [name] to schedule a Palliative Care consult appointment. No answer left a message to return call on daughters number. Patient's voicemail was full. Mychart message sent also.

## 2021-02-08 DIAGNOSIS — J449 Chronic obstructive pulmonary disease, unspecified: Secondary | ICD-10-CM | POA: Diagnosis not present

## 2021-02-09 ENCOUNTER — Other Ambulatory Visit: Payer: Self-pay | Admitting: Family Medicine

## 2021-02-09 NOTE — Telephone Encounter (Signed)
Refill request for methocarbamol (ROBAXIN) 500 MG tablet  LOV - 11/01/20 Next OV - not scheduled Last refill - 01/24/21 #40/0

## 2021-02-10 ENCOUNTER — Other Ambulatory Visit: Payer: Self-pay | Admitting: Family Medicine

## 2021-02-10 NOTE — Telephone Encounter (Signed)
Sent. Thanks.   

## 2021-02-10 NOTE — Telephone Encounter (Signed)
Refill request for Xanax 1 mg tablets  LOV - 11/01/20    Next OV - not scheduled Last refill -  11/27/20 #45/1

## 2021-02-12 NOTE — Telephone Encounter (Signed)
Sent. Thanks.   

## 2021-02-24 ENCOUNTER — Telehealth: Payer: Self-pay

## 2021-02-24 NOTE — Telephone Encounter (Signed)
[name] (POA for pt) left v/m that she is visiting from [place] and [name] has noticed that pt has had a severe decline in cognitive ability and recall.[name] noted that pt will ask same questions 5 times in a row. [name] wants to know if pt could start on Aricept. Pt would need a visit for eval. Unable to reach [place] by phone and left v/m requesting cb.sending note to triage and Janett Billow CMA and Dr Damita Dunnings.

## 2021-02-24 NOTE — Telephone Encounter (Signed)
[name] called back and scheduled 30' appt with DR Damita Dunnings on 02/27/21 at 3 PM. [name] said that she has been dx with covid and she is going via car back to [place] today. Pt has no symptoms of covid and pt will have covid test on 02/27/21 in morning and pts other daughter will call on Mon morning with update of pts condition (if any covid symptoms and result of covid test to determine if pt can come into office for cognitive testing. UC & ED precautions given and pt's daughter voiced understanding. Sending note to Dr Damita Dunnings and Janett Billow CMA. Teams to [place] also.

## 2021-02-24 NOTE — Telephone Encounter (Signed)
Dr. Damita Dunnings is aware of message and will await covid results Monday.

## 2021-02-26 NOTE — Telephone Encounter (Signed)
Noted. Thanks.

## 2021-02-27 ENCOUNTER — Ambulatory Visit: Payer: Medicare PPO | Admitting: Family Medicine

## 2021-03-07 ENCOUNTER — Telehealth: Payer: Self-pay | Admitting: Family Medicine

## 2021-03-07 NOTE — Telephone Encounter (Signed)
Pt daughter [name] called wanting to know if her mother could re-start the medication that helps with her memory

## 2021-03-07 NOTE — Telephone Encounter (Signed)
Tried to call [name] back but no answer and VM is not set up.

## 2021-03-08 ENCOUNTER — Telehealth: Payer: Self-pay

## 2021-03-08 NOTE — Telephone Encounter (Signed)
03/08/21 Attempted to contact patient's daughter [name] to reschedule [facility] consult. No answer went straight to voicemail and voicemail is full.

## 2021-03-08 NOTE — Telephone Encounter (Signed)
Tried to call [name] back but no answer and no VM to leave message.

## 2021-03-11 DIAGNOSIS — J449 Chronic obstructive pulmonary disease, unspecified: Secondary | ICD-10-CM | POA: Diagnosis not present

## 2021-03-14 ENCOUNTER — Telehealth: Payer: Self-pay | Admitting: Family Medicine

## 2021-03-14 NOTE — Telephone Encounter (Addendum)
I spoke with Ms [name] (DPR signed) said she is caregiver. [name] stays 3 nights weekly; pt is tired and does not feel like getting  up out of chair and does not want to come in for appt. Pt complains of bones hurting everywhere. Pt is slow to get around with walker. Pt does not want to cook. Ms [name] takes pt food to heat in microwave but when Ms [name] goes back food is still in fridge. If Ms [name] asks pt about her eating habits when Ms [name] is not there pt says she eats but Ms [name] is concerned pt does not eat when no one else is there. Ms [name] said when she brings food or cooks for pt pt will eat all food on her plate. Pt complains about H/As everyday with pain level for H/a being 9 - 10. Pt told Ms [name] she thinks she has a brain tumor. Pt has fibromyalgia and COPD and Ms [name] thinks she is depressed and pt takes med for depression but pt does nt think she is going to get a lot better. Pt has been taking tylenol and tramadol but not helping pain. Pt does have SOB but that could be due to COPD. Pt is presently on 3LO2  24/7. Pts oxygen has not been cked recent and Ms [name] is not sure what the pulse ox is. No dizziness or CP and no vision changes. Ms [name] has tried to encourage pt that she feels confident that Dr Damita Dunnings wants to see pt in office but pt still says she does not want to go for appt. UC & ED precautions given and Ms [name] voiced understanding. [name] request cb on 03/15/21 after DR Damita Dunnings has reviewed note. [facility]. Sending note to DR Damita Dunnings and Janett Billow CMA.

## 2021-03-14 NOTE — Telephone Encounter (Signed)
[name] called in wanted to know about [name] help due to she is hurting all over and won't come in for an appointment, and won't get out of her chair. [name] has tried to get her to come  in and none of the medication is working for pain.

## 2021-03-14 NOTE — Telephone Encounter (Signed)
Please see what details you can get and let me know.  Thanks.   

## 2021-03-15 NOTE — Telephone Encounter (Addendum)
Is palliative care able to see patient in the meantime? If needed, can we set up a home visit midday on Friday?  I am relocating to [place] that day and at this point I am uncertain about arrival time.  Please check with patient and family and palliative care and let me know.  Thanks.

## 2021-03-15 NOTE — Telephone Encounter (Signed)
Does the patient consent for the visit?  Please check with her.  Please check with her daughters to see if one of them can also be there.  Please let me know what you hear.  Thanks.

## 2021-03-15 NOTE — Telephone Encounter (Signed)
Ms [name] states palliative care has not started yet, they d an appointment scheduled on 03/02/21 but patient cancelled due to her daughter would not be able to be there with her for that appointment and she wanted her there. Ms [name] did agree to a house visit on Friday anytime between 12 and 3 pm if possible? Please call Ms [name] to advise her on Friday about what time provider can make it so they are prepared. Nothing new with patient today or over night that she is aware of. She has not seen patient today due to working.

## 2021-03-15 NOTE — Telephone Encounter (Signed)
Thanks.  Given all of the upheaval at clinic, I will plan to be there late morning- ~11:30. I can call prior to leaving the clinic.

## 2021-03-15 NOTE — Telephone Encounter (Signed)
Patient consents to the home visit on 03/17/21 and that [facility] her daughter will be there with the patient

## 2021-03-15 NOTE — Telephone Encounter (Signed)
See other phone note

## 2021-03-16 NOTE — Telephone Encounter (Signed)
Noted. Thanks.

## 2021-03-19 NOTE — Telephone Encounter (Signed)
See follow up documentation.

## 2021-03-20 ENCOUNTER — Other Ambulatory Visit: Payer: Self-pay | Admitting: Family Medicine

## 2021-03-20 ENCOUNTER — Other Ambulatory Visit: Payer: Self-pay

## 2021-03-20 ENCOUNTER — Telehealth: Payer: Self-pay | Admitting: Family Medicine

## 2021-03-20 DIAGNOSIS — J9611 Chronic respiratory failure with hypoxia: Secondary | ICD-10-CM

## 2021-03-20 MED ORDER — OXYBUTYNIN CHLORIDE 5 MG PO TABS: 5.0000 mg | ORAL_TABLET | Freq: Every evening | ORAL | 1 refills | Status: DC | PRN

## 2021-03-20 MED ORDER — CLOPIDOGREL BISULFATE 75 MG PO TABS: 75.0000 mg | ORAL_TABLET | Freq: Every day | ORAL | 0 refills | Status: DC

## 2021-03-20 NOTE — Telephone Encounter (Signed)
Summary of home visit done on 03/17/2021.  I called and talked with her daughter [name] prior to the visit.  [name] lives in [place].  Patient's other daughter [name] lives nearby and helps the patient.  When I went to the house she still lives in the same [place] house with a ramp.  She is oxygen dependent, using 3 L nasal cannula at baseline.  Inez Catalina was there for the visit.  We talked about her situation in general.  It is difficult for her to leave the house.  She is socially withdrawn but she also does not feel like being around other people a lot.  She spends some nights alone.  [name] comes to the house about 3-4 nights per week.  Patient says I am "eating pretty good I think" with [name] bringing in food.  The patient can heat up some food in the microwave but she does not normally use the oven or her stove.  She has a decrease in appetite overall.  She only eats fruit bar or a light snack for breakfast.  She typically has a meal that she heats in up with  the microwave for lunch and then similar again for dinner.  She is using a life alert button.  She says "I do not want to fall".  She is still using a walker in the house.  She has not had labs done recently.  Discussed trying to get that set up.  She has fatigue noted.  Unclear source.  See above regarding labs.  Hypoxia, requiring supplemental O2 via nasal cannula.  She has occasional cough with occasional sputum that is clear.  She is compliant with oxygen.  She is not wheezing.  Spiriva helps.  Chronic pain.  It is "in my bones".  She use lidocaine patch on her tailbone that helped but she still having diffuse pain otherwise.  She also has history of neck spasm especially along the right trapezius that has improved with muscle relaxers in the past.  Her sleep is disrupted.  She is taking trazodone.  She states that she does not get great sleep with that medication but she likely would not get much sleep at all without it.  Nocturia is a problem  for her.  No pain with urination.  Mood discussed with patient.  She says she is doing better with her movement her pain is under better control but when her pain is worse her mood is clearly lower.  Her main goal is to stay at home.  "I want to leave the house" meaning she would like to maintain her residence.  She is dependent on help from [place].  We talked about getting a call from social work to see if she would qualify for any program for extra help at home.  We talked about palliative care in general and going to palliative care.  We talked about palliative care and not being the exact same thing as hospice though it is possible to transition from one to the other.    Vitals. Blood pressure 140/70.  Pulse ox 98% on 3 L nasal cannula.  Pulse 64 and regular.  GEN: nad, alert and oriented, elderly female on O2 by nasal cannula, sitting in a chair in exam. HEENT: ncat NECK: supple w/o LA CV: rrr.   PULM: ctab, no inc wob, no wheeze. ABD: soft, +bs EXT: no edema SKIN: no acute rash  Plan. 1.  Reasonable to get out of care involved.  I would specifically  like to know if they can help draw labs.  I would appreciate any help they have to offer, asked with the patient.  2. Chronic pain.  Unclear if this is statin related.  Lidocaine patch helped before.  She needs help paying for lidocaine patch.  I am going to ask for pharmacy input.  3.  She needs refill on Spiriva.  I will work on this.  4.  Nocturia is a problem for the patient follow-up to consider options.  See following phone notes.

## 2021-03-20 NOTE — Telephone Encounter (Signed)
Please check with palliative care for this patient.  See if they would be able to draw blood at home.  If so then please let me know so we can make arrangements for that.  I tried to send a refill of Spiriva (#90, 1RF) to [facility] but I could not get this to go through.  I cannot get the pharmacy to load.  Please see if you can work on this.  I sent a prescription for oxybutynin to try at night to see if that helps with nocturia.  See if she can tolerate taking 5 mg at night.  I sent the prescription to [facility] drug.  Please update patient about that.  Thanks.

## 2021-03-20 NOTE — Telephone Encounter (Signed)
I need your input and help regarding her pain.  She has fibromyalgia and diffuse pain.  Is unclear to me if this is statin related- she had pain prior to statin initiation.  She is also going to need help paying for her lidocaine patches.  Those helped with sacral pain previously.  What is the best way to go about getting help paying for lidocaine patches?  I appreciate your input.

## 2021-03-21 MED ORDER — SPIRIVA HANDIHALER 18 MCG IN CAPS: ORAL_CAPSULE | RESPIRATORY_TRACT | 1 refills | Status: DC

## 2021-03-21 NOTE — Telephone Encounter (Signed)
Unable to contact a nurse at the office. Only able to get a VM. LVM requesting call back.

## 2021-03-21 NOTE — Telephone Encounter (Addendum)
Contacted Stacey at  [facility] with number [phone]. She is going to check on questions and update clinic with answers.    Received msg back from nurse, Maxwell Caul, at [facility] who reports they had tried to contact the pt many times in Aug but were never able to contact her or anyone in her family so the pt was never started on hospice. She is going to get her office to retry contacting the pt to get her set up. Advised pt will need labs. She is going to see if that can be provided also.    Contacted pt and advised PCP recommends palliative. She said she is not sure if she wants to do that yet or not. She said she thought someone was coming out to draw her blood but she wasn't sure who.  She said she talked to [facility] this morning and thought they were going to call the office. Advised a refill will be sent.    Pt wanted to let PCP know the oxybutynin saved her sleep more than anything she ever used. She appreciates it very much. She actually slept through the night. She said thank you to PCP.   Resent script for spiriva to [facility].

## 2021-03-22 NOTE — Telephone Encounter (Signed)
Glad oxybutynin helped.    Please check with palliative care on Thursday if you don't hear in the meantime.   See if they would be able to draw blood at home.  If so then please let me know so we can make arrangements for that.  And if not, then let me know.  Thanks.

## 2021-03-23 ENCOUNTER — Other Ambulatory Visit: Payer: Self-pay | Admitting: Family Medicine

## 2021-03-23 ENCOUNTER — Telehealth: Payer: Self-pay | Admitting: *Deleted

## 2021-03-23 NOTE — Chronic Care Management (AMB) (Signed)
  Chronic Care Management   Outreach Note  03/23/2021 Name: [name] MRN: [id number] DOB: Feb 04, 1931  [name] is a 87 y.o. year old female who is a primary care patient of Tonia Ghent, MD. I reached out to [name] by phone today in response to a referral sent by [name]'s PCP Tonia Ghent, MD     An unsuccessful telephone outreach was attempted today. The patient was referred to the case management team for assistance with care management and care coordination.   Follow Up Plan: A HIPAA compliant phone message was left for the patient providing contact information and requesting a return call.  If patient returns call to provider office, please advise to call Embedded Care Management Care Guide Lorenz Donley at [place], [place] [facility]  Direct Dial: [phone]

## 2021-03-24 NOTE — Telephone Encounter (Signed)
Refill request for Alprazolam, pantoprazole and crestor  LOV - 11/01/20 Next OV - not scheduled Last refill - 02/12/21 #45/1 for alprazolam

## 2021-03-26 NOTE — Telephone Encounter (Signed)
Sent. Thanks.  Home visit recent done.

## 2021-03-27 NOTE — Chronic Care Management (AMB) (Signed)
  Chronic Care Management   Outreach Note  03/27/2021 Name: [name] MRN: [id number] DOB: 1930/09/02  [name] is a 87 y.o. year old female who is a primary care patient of Tonia Ghent, MD. I reached out to [name] by phone today in response to a referral sent by [name]'s PCP Tonia Ghent, MD     A second unsuccessful telephone outreach was attempted today. The patient was referred to the case management team for assistance with care management and care coordination.   Follow Up Plan: A HIPAA compliant phone message was left for the patient providing contact information and requesting a return call.  If patient returns call to provider office, please advise to call Embedded Care Management Care Guide [name] at [place], [place] Management  Direct Dial: [phone]

## 2021-03-28 ENCOUNTER — Telehealth: Payer: Self-pay | Admitting: Family Medicine

## 2021-03-28 NOTE — Telephone Encounter (Signed)
Tried to call patient but no answer and could not leave VM. Also called [facility] back to see if they have been able to start care or not yet; had to leave a message to call office back.

## 2021-03-28 NOTE — Telephone Encounter (Signed)
error 

## 2021-03-28 NOTE — Telephone Encounter (Signed)
I was able to speak with [name] from [facility] and she stated they have been trying to get patient scheduled for palliative care since the referral was originally opened in January. She stated she has gotten patient scheduled a couple times but then she cancels the appointments so they have not been able to start any kind of care. [facility] has tried many times to reach out to both daughters and has gotten nothing. They are going to try once more to reach out to the patient and family before closing out the referral. She will send a message on epic about this once done. And I asked about getting labs done and they can not do blood draws on a patient unless the patient is completely homebound with no way to get to an office.   I called the patient and daughter earlier and left them both messages to call back.

## 2021-03-29 ENCOUNTER — Telehealth: Payer: Self-pay

## 2021-03-29 NOTE — Telephone Encounter (Signed)
Spoke to pt's daughter, [name]. She will call them.

## 2021-03-29 NOTE — Chronic Care Management (AMB) (Signed)
  Chronic Care Management   Note  03/29/2021 Name: Venezia Sargeant MRN: [id number] DOB: 16-Feb-1931  Inis Borneman is a 87 y.o. year old female who is a primary care patient of Tonia Ghent, MD. I reached out to [name] by phone today in response to a referral sent by Ms. Lawana Chambers Maura's PCP.  Ms. Piercey was given information about Chronic Care Management services today including:  CCM service includes personalized support from designated clinical staff supervised by her physician, including individualized plan of care and coordination with other care providers 24/7 contact phone numbers for assistance for urgent and routine care needs. Service will only be billed when office clinical staff spend 20 minutes or more in a month to coordinate care. Only one practitioner may furnish and bill the service in a calendar month. The patient may stop CCM services at any time (effective at the end of the month) by phone call to the office staff. The patient is responsible for co-pay (up to 20% after annual deductible is met) if co-pay is required by the individual health plan.   Patient agreed to services and verbal consent obtained.   Follow up plan: Telephone appointment with care management team member scheduled for: 04/17/2021  Julian Hy, [place], [place] Management  Direct Dial: [phone]

## 2021-03-29 NOTE — Telephone Encounter (Signed)
Several attempts to contact patient's daughter to schedule Palliative Care consult. No response to schedule. Will cancel referral at this time.

## 2021-03-29 NOTE — Telephone Encounter (Signed)
Noted. Thanks.

## 2021-03-29 NOTE — Telephone Encounter (Signed)
If you can get in touch with either daughter, please have them call palliative care.  Thanks.

## 2021-04-10 ENCOUNTER — Telehealth: Payer: Self-pay | Admitting: Family Medicine

## 2021-04-10 DIAGNOSIS — J449 Chronic obstructive pulmonary disease, unspecified: Secondary | ICD-10-CM | POA: Diagnosis not present

## 2021-04-10 NOTE — Chronic Care Management (AMB) (Signed)
  Chronic Care Management   Outreach Note  04/10/2021 Name: [name] MRN: [id number] DOB: 1931/01/20  Referred by: Tonia Ghent, MD Reason for referral : No chief complaint on file.   An unsuccessful telephone outreach was attempted today. The patient was referred to the pharmacist for assistance with care management and care coordination.   Follow Up Plan:  double check insurance  Psychologist, prison and probation services

## 2021-04-10 NOTE — Progress Notes (Signed)
  Chronic Care Management   Note  04/10/2021 Name: Keeli Roberg MRN: [id number] DOB: [birth year]  Rehmat Murtagh is a 87 y.o. year old female who is a primary care patient of Tonia Ghent, MD. I reached out to [name] by phone today in response to a referral sent by Ms. Lawana Chambers Debold's PCP, Tonia Ghent, MD.   Ms. Broxterman was given information about Chronic Care Management services today including:  CCM service includes personalized support from designated clinical staff supervised by her physician, including individualized plan of care and coordination with other care providers 24/7 contact phone numbers for assistance for urgent and routine care needs. Service will only be billed when office clinical staff spend 20 minutes or more in a month to coordinate care. Only one practitioner may furnish and bill the service in a calendar month. The patient may stop CCM services at any time (effective at the end of the month) by phone call to the office staff.   Patient agreed to services and verbal consent obtained.   Follow up plan: PT AWARE SHE HAS $20 Jeff

## 2021-04-11 ENCOUNTER — Other Ambulatory Visit: Payer: Self-pay

## 2021-04-11 ENCOUNTER — Telehealth: Payer: Self-pay

## 2021-04-11 ENCOUNTER — Ambulatory Visit (INDEPENDENT_AMBULATORY_CARE_PROVIDER_SITE_OTHER): Payer: Medicare PPO

## 2021-04-11 DIAGNOSIS — E785 Hyperlipidemia, unspecified: Secondary | ICD-10-CM

## 2021-04-11 DIAGNOSIS — F411 Generalized anxiety disorder: Secondary | ICD-10-CM

## 2021-04-11 DIAGNOSIS — I10 Essential (primary) hypertension: Secondary | ICD-10-CM

## 2021-04-11 DIAGNOSIS — J449 Chronic obstructive pulmonary disease, unspecified: Secondary | ICD-10-CM

## 2021-04-11 DIAGNOSIS — M797 Fibromyalgia: Secondary | ICD-10-CM

## 2021-04-11 NOTE — Patient Instructions (Signed)
April 11, 2021  Dear [name],  It was a pleasure meeting you during our initial appointment on April 11, 2021. Below is a summary of the goals we discussed and components of chronic care management. Please contact me anytime with questions or concerns.   Visit Information  Patient Care Plan: General Pharmacy (Adult)     Problem Identified: [facility] AMB "PATIENT-SPECIFIC PROBLEM"      Long-Range Goal: Disease Management   Start Date: 04/11/2021  Note:   Current Barriers:  Unable to independently afford treatment regimen Unable to self administer medications as prescribed  Pharmacist Clinical Goal(s):  Patient will contact provider office for questions/concerns as evidenced notation of same in electronic health record through collaboration with PharmD and provider.   Interventions: 1:1 collaboration with Tonia Ghent, MD regarding development and update of comprehensive plan of care as evidenced by provider attestation and co-signature Inter-disciplinary care team collaboration (see longitudinal plan of care) Comprehensive medication review performed; medication list updated in electronic medical record  Hypertension (BP goal <140/90) -Controlled - per clinic readings -Current treatment: None -Medications previously tried: lisinopril, bisoprolol - no longer needed  -Current home readings: none -Denies hypotensive/hypertensive symptoms -Educated on BP goals and benefits of medications for prevention of heart attack, stroke and kidney damage; -Recommended remain off treatment   Hyperlipidemia/CAD: (LDL goal < 70) -Controlled - LDL 55 -Current treatment: Rosuvastatin 10 mg - 1 tablet daily Clopidogrel 75 mg - 1 tablet daily -Medications previously tried: none  -Educated on pt confirms adherence. Pain is primarily left side so do not think her pain is statin related. -Recommended to continue current medication  Depression/Anxiety/Insomnia (Goal: Improve  symptoms) -Controlled - per patient report -Current treatment: Buspirone 5 mg - 1 tablet BID (not taking- not needed, per pt. She may take at bedtime PRN) Xanax 0.5 mg - 1/2 TID (uses TID) Duloxetine 60 mg - 1 capsule daily (not taking - unknown reason, she does have it on hand) Trazodone 50 mg - 3 tablets at bedtime  -Medications previously tried/failed: TCAs ineffective for sleep -PHQ9: last score of 0 (07/19/2020) -GAD7: n/a -Recommended to continue current medication; Resume Cymbalta  Osteoporosis / Osteopenia (Goal Prevent fractures, improve BMD) -Controlled, on appropriate therapy -Last DEXA Scan: 2020   T-Score femoral neck: -3.9  T-Score forearm radius: -3.5 -Patient is a candidate for pharmacologic treatment due to T-Score < -2.5 in femoral neck -Current treatment  Alendronate 70 mg - 1 tablet once weekly (started 03/2019) Vitamin D3 1000 IU - daily Calcium carbonate 600 mg - daily -Medications previously tried: none   -Counseled on oral bisphosphonate administration: take in the morning, 30 minutes prior to food with 6-8 oz of water. Do not lie down for at least 30 minutes after taking. Recommend weight-bearing and muscle strengthening exercises for building and maintaining bone density. -Recommended to continue current medication  COPD/Chronic resp. failure (Goal: Improve symptoms) -Not ideally controlled - Appropriate but inaccessible - unable to afford Spiriva -Current treatment  Spiriva - Inhale 1 capsule daily Albuterol - 2 puffs every 4-6 hours PRN  -Medications previously tried: none reported  -Pt reports Spiriva helps more than albuterol but she has a hard time with the cost and the capsule component. Will consider [employer] assistance program online. Metered-dose inhaler (glycopyrrolate 9 mcg/formoterol 4.8 mcg  2 inhalations twice daily) -Collaborated with PCP - discuss inhaler options   If daughter can assist with apply for Spiriva PAP. If not, will consider change  to [employer] to apply  for assistance online.  Pain (Goal: Improve symptoms) -Uncontrolled - per patient report -Pain is in back, left hip, left leg - she thinks this is from falling on left side last year -Last fall about 1 year ago per her report -Current treatment  Diclofenac 1% gel - applys PRN (unable to reach her back to apply this) Lidocaine patches - does not have any currently due to cost  [facility] Max with Lidocaine spray - uses this for her back  Tylenol - for headaches  -Medications previously tried: Tramadol ineffective  -Recommended resume duloxetine as this may help with pain (pt not taking). Check price options on lidocaine patches  Patient Goals/Self-Care Activities Patient will:  - take medications as prescribed  Follow Up Plan: Telephone follow up appointment with care management team member scheduled for:   -CPP 2 weeks     Ms. Schlick was given information about Chronic Care Management services today including:  CCM service includes personalized support from designated clinical staff supervised by her physician, including individualized plan of care and coordination with other care providers 24/7 contact phone numbers for assistance for urgent and routine care needs. Standard insurance, coinsurance, copays and deductibles apply for chronic care management only during months in which we provide at least 20 minutes of these services. Most insurances cover these services at 100%, however patients may be responsible for any copay, coinsurance and/or deductible if applicable. This service may help you avoid the need for more expensive face-to-face services. Only one practitioner may furnish and bill the service in a calendar month. The patient may stop CCM services at any time (effective at the end of the month) by phone call to the office staff.  Patient agreed to services and verbal consent obtained.   Patient verbalizes understanding of instructions provided today and  agrees to view in [place].   Debbora Dus, PharmD Clinical Pharmacist [facility] at [facility] [phone]

## 2021-04-11 NOTE — Progress Notes (Addendum)
Contacted [facility] and spoke with the pharmacy. They carry OTC 4% Lidocaine patches  #6 in a box for $10.88. Reached out to contact patient but no one answered. Message left for the patient and to return call if any questions.  Debbora Dus, CPP notified  Avel Sensor, [place] Assistant [phone]  Total time spent for month CPA: 4 min

## 2021-04-11 NOTE — Progress Notes (Signed)
Chronic Care Management Pharmacy Note  04/11/2021 Name:  [name] MRN:  [id number] DOB:  [birth year]  Summary: PCP consult for pain/cost of meds - completed initial CCM medication review. Patient states she is off her duloxetine for unknown reason. Encouraged her to resume as this can help with pain. She also needs help with lidocaine patch cost. No assistance programs found. Will call [facility] for OTC pricing options. Needs assistance with Spiriva cost - offer PAP.  Recommendations/Changes made from today's visit: Resume duloxetine  Plan: CCM follow up 2 weeks Complete Spiriva PAP CMA to check with [facility] on OTC lidocaine patch options   Subjective: [name] is an 87 y.o. year old female who is a primary patient of Damita Dunnings, Elveria Rising, MD.  The CCM team was consulted for assistance with disease management and care coordination needs.    Engaged with patient by telephone for initial visit in response to provider referral for pharmacy case management and/or care coordination services.   Consent to Services:  The patient was given the following information about Chronic Care Management services today, agreed to services, and gave verbal consent: 1. CCM service includes personalized support from designated clinical staff supervised by the primary care provider, including individualized plan of care and coordination with other care providers 2. 24/7 contact phone numbers for assistance for urgent and routine care needs. 3. Service will only be billed when office clinical staff spend 20 minutes or more in a month to coordinate care. 4. Only one practitioner may furnish and bill the service in a calendar month. 5.The patient may stop CCM services at any time (effective at the end of the month) by phone call to the office staff. 6. The patient will be responsible for cost sharing (co-pay) of up to 20% of the service fee (after annual deductible is met). Patient  agreed to services and consent obtained.  Patient Care Team: Tonia Ghent, MD as PCP - General Tamala Julian Lynnell Dike, MD as PCP - Cardiology (Cardiology) Magrinat, Virgie Dad, MD as Consulting Physician (Oncology) [name], MD as Consulting Physician (Otolaryngology) Marshell Garfinkel, MD as Consulting Physician (Pulmonary Disease) Deirdre Peer, LCSW as Social Worker Debbora Dus, [facility] as Pharmacist (Pharmacist)  Recent office visits: 11/01/20 - Video Visit/PCP - Discussed housing, patient would prefer to stay home rather than SNF.  Recent consult visits: None in previous 6 months  Hospital visits: None in previous 6 months   Objective:  Lab Results  Component Value Date   CREATININE 0.59 11/03/2020   BUN 16 11/03/2020   GFR 80.86 11/03/2020   GFRNONAA >60 07/11/2020   GFRAA >60 09/14/2019   NA 143 11/03/2020   K 4.0 11/03/2020   CALCIUM 8.8 11/03/2020   CO2 40 (H) 11/03/2020   GLUCOSE 90 11/03/2020    Lab Results  Component Value Date/Time   HGBA1C 5.6 01/30/2019 05:39 AM   HGBA1C 5.6 01/29/2019 12:52 PM   GFR 80.86 11/03/2020 12:50 PM   GFR 80.65 03/27/2019 11:40 AM   MICROALBUR 0.4 05/24/2009 09:36 AM   MICROALBUR 1.3 11/26/2008 09:27 AM    Lab Results  Component Value Date   CHOL 119 11/03/2020   HDL 44.10 11/03/2020   LDLCALC 55 11/03/2020   LDLDIRECT 157.9 03/12/2007   TRIG 96.0 11/03/2020   CHOLHDL 3 11/03/2020    Hepatic Function Latest Ref Rng & Units 11/03/2020 07/11/2020 07/10/2020  Total Protein 6.0 - 8.3 g/dL 6.4 6.5 6.5  Albumin 3.5 - 5.2  g/dL 3.7 3.4(L) 3.4(L)  AST 0 - 37 U/L 30 51(H) 64(H)  ALT 0 - 35 U/L _0 Alk Phosphatase 39 - 117 U/L 36(L) 31(L) 31(L)  Total Bilirubin 0.2 - 1.2 mg/dL 0.5 0.4 0.5  Bilirubin, Direct 0.00 - 0.40 mg/dL - - -    Lab Results  Component Value Date/Time   TSH 10.31 (H) 11/03/2020 12:50 PM   TSH 3.577 09/07/2019 10:45 AM   TSH 0.38 03/24/2018 11:55 AM   FREET4 0.80 03/02/2013 11:52 AM   FREET4  0.8 05/24/2009 09:36 AM    CBC Latest Ref Rng & Units 11/03/2020 07/11/2020 07/10/2020  WBC 4.0 - 10.5 K/uL 4.3 4.2 5.5  Hemoglobin 12.0 - 15.0 g/dL 9.4(L) 9.9(L) 9.5(L)  Hematocrit 36.0 - 46.0 % 28.9(L) 30.6(L) 30.0(L)  Platelets 150.0 - 400.0 K/uL 175.0 143(L) 140(L)    Lab Results  Component Value Date/Time   VD25OH 31.08 11/03/2020 12:50 PM   VD25OH 36.81 03/24/2018 11:55 AM    Clinical ASCVD: Yes  The ASCVD Risk score (Arnett DK, et al., 2019) failed to calculate for the following reasons:   The 2019 ASCVD risk score is only valid for ages 95 to 47   The patient has a prior MI or stroke diagnosis    Depression screen [facility] 2/9 07/19/2020 07/19/2020 07/23/2019  Decreased Interest 0 0 0  Down, Depressed, Hopeless 0 1 0  PHQ - 2 Score 0 1 0  Some recent data might be hidden    Social History   Tobacco Use  Smoking Status Former   Packs/day: 1.50   Years: 10.00   Pack years: 15.00   Types: Cigarettes   Quit date: 07/09/1985   Years since quitting: 35.7  Smokeless Tobacco Never  Tobacco Comments   1 1/2 ppd x 20 years   BP Readings from Last 3 Encounters:  07/11/20 133/66  02/03/20 (!) 146/72  09/14/19 (!) 141/80   Pulse Readings from Last 3 Encounters:  07/11/20 (!) 54  02/03/20 74  09/14/19 72   Wt Readings from Last 3 Encounters:  07/19/20 132 lb (59.9 kg)  07/11/20 131 lb 9.8 oz (59.7 kg)  02/03/20 130 lb (59 kg)   BMI Readings from Last 3 Encounters:  11/01/20 22.66 kg/m  07/19/20 22.66 kg/m  07/11/20 24.07 kg/m    Assessment/Interventions: Review of patient past medical history, allergies, medications, health status, including review of consultants reports, laboratory and other test data, was performed as part of comprehensive evaluation and provision of chronic care management services.   SDOH:  (Social Determinants of Health) assessments and interventions performed: Yes SDOH Interventions    Flowsheet Row Most Recent Value  SDOH Interventions    Financial Strain Interventions Other (Comment)  [Cost assistance]      SDOH Screenings   Alcohol Screen: Not on file  Depression (PHQ2-9): Low Risk    PHQ-2 Score: 0  Financial Resource Strain: High Risk   Difficulty of Paying Living Expenses: Hard  Food Insecurity: Not on file  Housing: Not on file  Physical Activity: Not on file  Social Connections: Not on file  Stress: Not on file  Tobacco Use: Medium Risk   Smoking Tobacco Use: Former   Smokeless Tobacco Use: Never  Transportation Needs: Not on file    [place]  Allergies  Allergen Reactions   Doxycycline Other (See Comments)    GI upset   Sulfadiazine Other (See Comments)    REACTION: Fever, aches   Sulfa Antibiotics  Rash    Medications Reviewed Today     Reviewed by Debbora Dus, [facility] (Pharmacist) on 04/11/21 at 34  Med List Status: <None>   Medication Order Taking? Sig Documenting Provider Last Dose Status Informant  albuterol (PROAIR HFA) 108 (90 Base) MCG/ACT inhaler [id number] Yes Inhale 1-2 puffs into the lungs every 6 (six) hours as needed for wheezing or shortness of breath (Okay to fill with Ventolin or albuterol HFA). Tonia Ghent, MD Taking Active Child  alendronate (FOSAMAX) 70 MG tablet [id number] Yes Take 70 mg by mouth once a week. [provider] Taking Active   ALPRAZolam Duanne Moron) 1 MG tablet [id number] Yes TAKE 1/2 TABLET BY MOUTH THREE TIMES DAILY AS NEEDED FOR ANXIETY Tonia Ghent, MD Taking Active   anastrozole (ARIMIDEX) 1 MG tablet [id number] Yes Take 1 tablet (1 mg total) by mouth daily. Magrinat, Virgie Dad, MD Taking Active     Discontinued 09/14/19 2204 busPIRone (BUSPAR) 5 MG tablet [id number] No TAKE ONE TABLET BY MOUTH IN THE MORNING AND IN THE EVENING  Patient not taking: Reported on 04/11/2021   Tonia Ghent, MD Not Taking Active   Calcium Carbonate (CALTRATE 600 PO) [id number] Yes Take 600 mg by mouth daily. [provider] Taking Active Child   cholecalciferol (VITAMIN D) 1000 units tablet [id number] Yes Take 1 tablet (1,000 Units total) by mouth daily. Tonia Ghent, MD Taking Active Child  clopidogrel (PLAVIX) 75 MG tablet [id number] Yes Take 1 tablet (75 mg total) by mouth daily. Please make overdue appt with Dr. Tamala Julian before anymore refills. Thank you 1st attempt Belva Crome, MD Taking Active   diclofenac Sodium ([employer] DICLOFENAC SODIUM) 1 % GEL [id number] Yes Apply 2 g topically 4 (four) times daily as needed. Tonia Ghent, MD Taking Active   DULoxetine (CYMBALTA) 60 MG capsule [id number] No TAKE ONE CAPSULE BY MOUTH DAILY  Patient not taking: Reported on 04/11/2021   Tonia Ghent, MD Not Taking Active   ferrous sulfate (FERROUSUL) 325 (65 FE) MG tablet [id number] Yes Take 1 tablet (325 mg total) by mouth daily with breakfast. Tonia Ghent, MD Taking Active Child  fluticasone [facility]) 50 MCG/ACT nasal spray [id number] Yes PLACE 2 SPRAYS INTO BOTH NOSTRILS DAILY. Tonia Ghent, MD Taking Active Child  levothyroxine (SYNTHROID) 125 MCG tablet [id number] Yes TAKE ONE TABLET BY MOUTH DAILY Tonia Ghent, MD Taking Active   methocarbamol (ROBAXIN) 500 MG tablet [id number] Yes TAKE 1 TABLET BY MOUTH 4 TIMES DAILY AS NEEDED FOR MUSCLE SPASMS. Tonia Ghent, MD Taking Active   Multiple Vitamin (MULTIVITAMIN WITH MINERALS) TABS tablet [id number] Yes Take 1 tablet by mouth daily. [provider] Taking Active Child  Multiple Vitamins-Minerals (OCUVITE PRESERVISION PO) [id number] Yes Take 1 tablet by mouth daily. [provider] Taking Active Child  nitroGLYCERIN (NITROSTAT) 0.4 MG SL tablet [id number] Yes Place 1 tablet (0.4 mg total) under the tongue every 5 (five) minutes as needed for chest pain. Belva Crome, MD Taking Active Child  oxybutynin (DITROPAN) 5 MG tablet [id number] Yes Take 1 tablet (5 mg total) by mouth at bedtime as needed for bladder spasms. Tonia Ghent, MD Taking Active   OXYGEN [id number]  Yes Inhale 3-3.5 L into the lungs continuous. [provider] Taking Active Child  pantoprazole (PROTONIX) 20 MG tablet [id number] Yes TAKE ONE TABLET BY MOUTH TWICE DAILY Tonia Ghent, MD Taking Active   rosuvastatin (CRESTOR) 10 MG tablet [id number] Yes TAKE ONE TABLET BY MOUTH  DAILY Tonia Ghent, MD Taking Active   tiotropium [facility] HANDIHALER) 18 MCG inhalation capsule [id number] Yes PLACE 1 CAPSULE INTO INHALER AND INHALE BY MOUTH DAILY Tonia Ghent, MD Taking Active   traZODone (DESYREL) 50 MG tablet [id number] Yes TAKE THREE TABLETS BY MOUTH AT BEDTIME AS NEEDED Tonia Ghent, MD Taking Active             Patient Active Problem List   Diagnosis Date Noted   Generalized weakness 07/07/2020   Decreased appetite 09/20/2019   Lower GI bleeding 09/09/2019   DNR (do not resuscitate) 09/08/2019   Anemia 09/07/2019   Macrocytic anemia 09/07/2019   Jerking 09/07/2019   Fall at home, initial encounter 09/07/2019   History of CVA (cerebrovascular accident) 09/07/2019   History of breast cancer 09/07/2019   Pelvic pain 03/25/2019   Dysphagia 02/18/2019   Coronary artery disease involving native coronary artery of native heart with unstable angina pectoris ([place]) 01/30/2019   Presence of drug coated stent in right coronary artery 01/30/2019   Left main coronary artery disease - ~50-60% dLM (&70% dLAD) -- Plan Med Rx 01/30/2019   Ischemic cardiomyopathy 01/30/2019   Acute ST elevation myocardial infarction (STEMI) of inferior wall ([place]) 01/29/2019   AAA (abdominal aortic aneurysm) 12/31/2018   Malignant neoplasm of upper-outer quadrant of right breast in female, estrogen receptor positive ([place]) 09/26/2018   Breast mass 08/28/2018   Bilateral impacted cerumen 08/14/2017   Laryngopharyngeal reflux (LPR) 08/14/2017   Presbycusis of both ears 08/14/2017   Thoracic aortic aneurysm 04/10/2017   Abdominal pain 04/05/2017   Low back pain 01/01/2017   Medicare annual  wellness visit, subsequent 09/26/2016   Hard of hearing 09/26/2016   Vitamin D deficiency 09/26/2016   Advance care planning 09/26/2016   Encounter for chronic pain management 07/26/2016   Edema 10/21/2014   Multinodular thyroid 07/07/2013   HTN (hypertension) 06/09/2013   Chronic respiratory failure ([place]) 05/13/2013   Cough 05/02/2013   Vertigo 12/31/2012   Acute blood loss anemia 12/13/2011   Osteoarthritis of right knee 08/27/2011   Gold C Copd with asthmatic bronchitis component and chronic resp failure 06/26/2011   Neck pain 04/02/2011   RISK OF FALLING 08/17/2010   Hypokalemia 05/24/2009   Anemia, unspecified 04/10/2008   Anxiety state 03/01/2008   Osteoporosis 06/23/2007   Chronic pain syndrome 12/26/2006   Hypothyroidism 12/24/2006   Hyperlipidemia with target LDL less than 70 12/24/2006   COMMON MIGRAINE 12/24/2006   GERD 12/24/2006   DEGENERATIVE DISC DISEASE 12/24/2006   Fibromyalgia 12/24/2006   Insomnia 12/24/2006   HYPERGLYCEMIA 12/24/2006    Immunization History  Administered Date(s) Administered   Fluad Quad(high Dose 65+) 03/27/2019   Influenza Split 04/09/2011, 03/19/2012, 04/21/2013   Influenza Whole 04/08/2004, 05/07/2007, 03/11/2009, 04/21/2010   Influenza, High Dose Seasonal PF 04/24/2017   Influenza-Unspecified 04/20/2013, 04/07/2014, 05/05/2015, 04/08/2016   PFIZER(Purple Top)SARS-COV-2 Vaccination 07/28/2019, 08/18/2019   Pneumococcal Conjugate-13 08/23/2015   Pneumococcal Polysaccharide-23 07/09/1998   Td 04/19/2008   Zoster, Live 05/02/2015    Conditions to be addressed/monitored:  Hypertension, Hyperlipidemia, Coronary Artery Disease, Depression, Anxiety, Osteoporosis, and Overactive Bladder  Care Plan : General Pharmacy (Adult)  Updates made by Debbora Dus, [place] since 04/11/2021 12:00 AM     Problem: CHL AMB "PATIENT-SPECIFIC PROBLEM"      Long-Range Goal: Disease Management   Start Date: 04/11/2021  Note:   Current Barriers:   Unable to independently afford treatment regimen Unable to self administer medications as prescribed  Pharmacist  Clinical Goal(s):  Patient will contact provider office for questions/concerns as evidenced notation of same in electronic health record through collaboration with PharmD and provider.   Interventions: 1:1 collaboration with Tonia Ghent, MD regarding development and update of comprehensive plan of care as evidenced by provider attestation and co-signature Inter-disciplinary care team collaboration (see longitudinal plan of care) Comprehensive medication review performed; medication list updated in electronic medical record  Hypertension (BP goal <140/90) -Controlled - per clinic readings -Current treatment: None -Medications previously tried: lisinopril, bisoprolol - no longer needed  -Current home readings: none -Denies hypotensive/hypertensive symptoms -Educated on BP goals and benefits of medications for prevention of heart attack, stroke and kidney damage; -Recommended remain off treatment   Hyperlipidemia/CAD: (LDL goal < 70) -Controlled - LDL 55 -Current treatment: Rosuvastatin 10 mg - 1 tablet daily Clopidogrel 75 mg - 1 tablet daily -Medications previously tried: none  -Educated on pt confirms adherence. Pain is primarily left side so do not think her pain is statin related. -Recommended to continue current medication  Depression/Anxiety/Insomnia (Goal: Improve symptoms) -Controlled - per patient report -Current treatment: Buspirone 5 mg - 1 tablet BID (not taking- not needed, per pt. She may take at bedtime PRN) Xanax 0.5 mg - 1/2 TID (uses TID) Duloxetine 60 mg - 1 capsule daily (not taking - unknown reason, she does have it on hand) Trazodone 50 mg - 3 tablets at bedtime  -Medications previously tried/failed: TCAs ineffective for sleep -PHQ9: last score of 0 (07/19/2020) -GAD7: n/a -Recommended to continue current medication; Resume  Cymbalta  Osteoporosis / Osteopenia (Goal Prevent fractures, improve BMD) -Controlled, on appropriate therapy -Last DEXA Scan: 2020   T-Score femoral neck: -3.9  T-Score forearm radius: -3.5 -Patient is a candidate for pharmacologic treatment due to T-Score < -2.5 in femoral neck -Current treatment  Alendronate 70 mg - 1 tablet once weekly (started 03/2019) Vitamin D3 1000 IU - daily Calcium carbonate 600 mg - daily -Medications previously tried: none   -Counseled on oral bisphosphonate administration: take in the morning, 30 minutes prior to food with 6-8 oz of water. Do not lie down for at least 30 minutes after taking. Recommend weight-bearing and muscle strengthening exercises for building and maintaining bone density. -Recommended to continue current medication  COPD/Chronic resp. failure (Goal: Improve symptoms) -Not ideally controlled - Appropriate but inaccessible - unable to afford Spiriva -Current treatment  Spiriva - Inhale 1 capsule daily Albuterol - 2 puffs every 4-6 hours PRN  -Medications previously tried: none reported  -Pt reports Spiriva helps more than albuterol but she has a hard time with the cost and the capsule component. Will consider [employer] assistance program online. Metered-dose inhaler (glycopyrrolate 9 mcg/formoterol 4.8 mcg  2 inhalations twice daily) -Collaborated with PCP - discuss inhaler options   If daughter can assist with apply for Spiriva PAP. If not, will consider change to [employer] to apply for assistance online.  Pain (Goal: Improve symptoms) -Uncontrolled - per patient report -Pain is in back, left hip, left leg - she thinks this is from falling on left side last year -Last fall about 1 year ago per her report -Current treatment  Diclofenac 1% gel - applys PRN (unable to reach her back to apply this) Lidocaine patches - does not have any currently due to cost  [facility] Max with Lidocaine spray - uses this for her back  Tylenol - for headaches   -Medications previously tried: Tramadol ineffective  -Recommended resume duloxetine as this may help with  pain (pt not taking). Check price options on lidocaine patches  Patient Goals/Self-Care Activities Patient will:  - take medications as prescribed  Follow Up Plan: Telephone follow up appointment with care management team member scheduled for:   -CPP 2 weeks     Medication Assistance:  Med cost concerns - Lidocaine patches and Spiriva.   Compliance/Adherence/Medication fill history: Care Gaps: None  Star-Rating Drugs: No refill history in chart  Patient's preferred pharmacy is: [facility], [place] [address] Phone: [phone] Fax: [phone]  [facility] [place], [place] [place] [address] Phone: [phone] Fax: [phone]  Daughter and nephew pick up her meds locally, but most come from mail order pharmacy ([facility]). She is concerned about lidocaine patch and Spiriva price.   Uses pill box? No - has tried pill packs and did not like them  Pt endorses 100% compliance -Sleeps in, takes morning meds around 11:30 AM -She takes thyroid medicine with other meds in AM -She takes the Alendronate on Tuesday mornings, 1 hour before other meds -She takes evening meds at bedtime (3 trazodone, oxybutynin)  Care Plan and Follow Up Patient Decision:  Patient agrees to Care Plan and Follow-up.  Debbora Dus, PharmD Clinical Pharmacist [facility] [phone]

## 2021-04-11 NOTE — Progress Notes (Signed)
Chronic Care Management Pharmacy Assistant   Name: Tyjai Charbonnet  MRN: [id number] DOB: [birth year]  Reason for Encounter: Initial Questions   Medications: Outpatient Encounter Medications as of 04/11/2021  Medication Sig   albuterol (PROAIR HFA) 108 (90 Base) MCG/ACT inhaler Inhale 1-2 puffs into the lungs every 6 (six) hours as needed for wheezing or shortness of breath (Okay to fill with Ventolin or albuterol HFA).   alendronate (FOSAMAX) 70 MG tablet Take 70 mg by mouth once a week.   ALPRAZolam (XANAX) 1 MG tablet TAKE 1/2 TABLET BY MOUTH THREE TIMES DAILY AS NEEDED FOR ANXIETY   anastrozole (ARIMIDEX) 1 MG tablet Take 1 tablet (1 mg total) by mouth daily.   busPIRone (BUSPAR) 5 MG tablet TAKE ONE TABLET BY MOUTH IN THE MORNING AND IN THE EVENING (Patient not taking: Reported on 04/11/2021)   Calcium Carbonate (CALTRATE 600 PO) Take 600 mg by mouth daily.   cholecalciferol (VITAMIN D) 1000 units tablet Take 1 tablet (1,000 Units total) by mouth daily.   clopidogrel (PLAVIX) 75 MG tablet Take 1 tablet (75 mg total) by mouth daily. Please make overdue appt with Dr. Tamala Julian before anymore refills. Thank you 1st attempt   diclofenac Sodium ([employer]S DICLOFENAC SODIUM) 1 % GEL Apply 2 g topically 4 (four) times daily as needed.   DULoxetine (CYMBALTA) 60 MG capsule TAKE ONE CAPSULE BY MOUTH DAILY (Patient not taking: Reported on 04/11/2021)   ferrous sulfate (FERROUSUL) 325 (65 FE) MG tablet Take 1 tablet (325 mg total) by mouth daily with breakfast.   fluticasone (FLONASE) 50 MCG/ACT nasal spray PLACE 2 SPRAYS INTO BOTH NOSTRILS DAILY.   levothyroxine (SYNTHROID) 125 MCG tablet TAKE ONE TABLET BY MOUTH DAILY   methocarbamol (ROBAXIN) 500 MG tablet TAKE 1 TABLET BY MOUTH 4 TIMES DAILY AS NEEDED FOR MUSCLE SPASMS.   Multiple Vitamin (MULTIVITAMIN WITH MINERALS) TABS tablet Take 1 tablet by mouth daily.   Multiple Vitamins-Minerals (OCUVITE PRESERVISION PO) Take 1 tablet by mouth daily.    nitroGLYCERIN (NITROSTAT) 0.4 MG SL tablet Place 1 tablet (0.4 mg total) under the tongue every 5 (five) minutes as needed for chest pain.   oxybutynin (DITROPAN) 5 MG tablet Take 1 tablet (5 mg total) by mouth at bedtime as needed for bladder spasms.   OXYGEN Inhale 3-3.5 L into the lungs continuous.   pantoprazole (PROTONIX) 20 MG tablet TAKE ONE TABLET BY MOUTH TWICE DAILY   rosuvastatin (CRESTOR) 10 MG tablet TAKE ONE TABLET BY MOUTH DAILY   tiotropium (SPIRIVA HANDIHALER) 18 MCG inhalation capsule PLACE 1 CAPSULE INTO INHALER AND INHALE BY MOUTH DAILY   traZODone (DESYREL) 50 MG tablet TAKE THREE TABLETS BY MOUTH AT BEDTIME AS NEEDED   [DISCONTINUED] bisoprolol (ZEBETA) 5 MG tablet Take 0.5 tablets (2.5 mg total) by mouth daily.   No facility-administered encounter medications on file as of 04/11/2021.    Lab Results  Component Value Date/Time   HGBA1C 5.6 01/30/2019 05:39 AM   HGBA1C 5.6 01/29/2019 12:52 PM   MICROALBUR 0.4 05/24/2009 09:36 AM   MICROALBUR 1.3 11/26/2008 09:27 AM     BP Readings from Last 3 Encounters:  07/11/20 133/66  02/03/20 (!) 146/72  09/14/19 (!) 141/80   Star Rating Drugs:  Medication:  Last Fill: Day Supply Rosuvastatin 10mg  03/14/2021 30 Verified fill date with [place] Gaps: Annual wellness visit in last year? No 09/26/2016 Most Recent BP reading: 133/66 on 07/11/2020  Debbora Dus, CPP notified  Marijean Niemann, RMA Clinical  Pharmacy Assistant [phone]   Time Spent: 10 Minutes

## 2021-04-12 NOTE — Telephone Encounter (Signed)
Appreciate pharmacy input, see following note.

## 2021-04-14 ENCOUNTER — Other Ambulatory Visit: Payer: Self-pay | Admitting: Family Medicine

## 2021-04-14 ENCOUNTER — Other Ambulatory Visit: Payer: Self-pay

## 2021-04-14 MED ORDER — CLOPIDOGREL BISULFATE 75 MG PO TABS: 75.0000 mg | ORAL_TABLET | Freq: Every day | ORAL | 0 refills | Status: DC

## 2021-04-14 NOTE — Telephone Encounter (Signed)
Pt's medication was sent to pt's pharmacy as requested. Confirmation received.  °

## 2021-04-17 ENCOUNTER — Telehealth: Payer: Self-pay | Admitting: *Deleted

## 2021-04-17 ENCOUNTER — Telehealth: Payer: Medicare PPO

## 2021-04-17 NOTE — Telephone Encounter (Signed)
  Care Management   Follow Up Note   04/17/2021 Name: [name] MRN: [id number] DOB: 06/24/1931   Referred by: Tonia Ghent, MD Reason for referral : No chief complaint on file.   An unsuccessful telephone outreach was attempted today. The patient was referred to the case management team for assistance with care management and care coordination.   Follow Up Plan: The care management team will reach out to the patient again over the next 7-10 days.   Eduard Clos MSW, LCSW Licensed Clinical Social Worker [place] [phone]

## 2021-04-19 ENCOUNTER — Telehealth: Payer: Self-pay | Admitting: *Deleted

## 2021-04-19 NOTE — Chronic Care Management (AMB) (Signed)
  Care Management   Note  04/19/2021 Name: [name] MRN: [id number] DOB: 10/13/1930  [name] is a 87 y.o. year old female who is a primary care patient of Tonia Ghent, MD and is actively engaged with the care management team. I reached out to [name] by phone today to assist with re-scheduling an initial visit with the Licensed Clinical Social Worker  Follow up plan: Telephone appointment with care management team member scheduled for: 05/01/2021   Julian Hy, [place], [place] Management  Direct Dial: [phone]

## 2021-04-19 NOTE — Chronic Care Management (AMB) (Signed)
  Care Management   Note  04/19/2021 Name: [name] MRN: [id number] DOB: Nov 15, 1930  [name] is a 87 y.o. year old female who is a primary care patient of Tonia Ghent, MD and is actively engaged with the care management team. I reached out to [name] by phone today to assist with re-scheduling an initial visit with the Licensed Clinical Social Worker  Follow up plan: Unsuccessful telephone outreach attempt made. A HIPAA compliant phone message was left for the patient providing contact information and requesting a return call.   Julian Hy, [place] Management  Direct Dial: [phone]

## 2021-05-01 ENCOUNTER — Telehealth: Payer: Medicare PPO

## 2021-05-01 ENCOUNTER — Telehealth: Payer: Self-pay | Admitting: *Deleted

## 2021-05-05 NOTE — Telephone Encounter (Signed)
  Care Management   Follow Up Note   05/05/2021 Name: [name] MRN: [id number] DOB: 1931/03/20   Referred by: Tonia Ghent, MD Reason for referral : No chief complaint on file.   Third unsuccessful telephone outreach was attempted today. The patient was referred to the case management team for assistance with care management and care coordination. The patient's primary care provider has been notified of our unsuccessful attempts to make or maintain contact with the patient. The care management team is pleased to engage with this patient at any time in the future should he/she be interested in assistance from the care management team.   Follow Up Plan: The care management team will reach out to the patient again over the next 30 days.   Eduard Clos MSW, LCSW Licensed Clinical Social Worker [place] [phone]

## 2021-05-08 DIAGNOSIS — J449 Chronic obstructive pulmonary disease, unspecified: Secondary | ICD-10-CM

## 2021-05-08 DIAGNOSIS — E785 Hyperlipidemia, unspecified: Secondary | ICD-10-CM

## 2021-05-08 DIAGNOSIS — I10 Essential (primary) hypertension: Secondary | ICD-10-CM | POA: Diagnosis not present

## 2021-05-11 DIAGNOSIS — J449 Chronic obstructive pulmonary disease, unspecified: Secondary | ICD-10-CM | POA: Diagnosis not present

## 2021-05-15 ENCOUNTER — Other Ambulatory Visit: Payer: Self-pay | Admitting: Family Medicine

## 2021-05-15 NOTE — Telephone Encounter (Signed)
Refill request for ALPRAZolam [name]) 1 MG tablet  LOV - 11/01/20 Next OV - not scheduled Last refill - 03/26/21 #45/1

## 2021-05-16 NOTE — Telephone Encounter (Signed)
Sent. Thanks.   

## 2021-05-17 ENCOUNTER — Ambulatory Visit (INDEPENDENT_AMBULATORY_CARE_PROVIDER_SITE_OTHER): Payer: Medicare PPO | Admitting: *Deleted

## 2021-05-17 DIAGNOSIS — I10 Essential (primary) hypertension: Secondary | ICD-10-CM

## 2021-05-17 DIAGNOSIS — J449 Chronic obstructive pulmonary disease, unspecified: Secondary | ICD-10-CM

## 2021-05-17 NOTE — Patient Instructions (Signed)
Visit Information   PATIENT GOALS/PLAN OF CARE:  Care Plan : LCSW Plan of Care  Updates made by Deirdre Peer, LCSW since 05/17/2021 12:00 AM     Problem: Social and Functional Symptoms      Long-Range Goal: Provide support and resources to optimize pt's social and functional abilities   Start Date: 05/17/2021  Expected End Date: 06/27/2021  This Visit's Progress: On track  Priority: High  Note:   Current barriers:   Patient in need of assistance with connecting to community resources for Limited social support, Transportation, Social Isolation, and Limited access to caregiver Acknowledges deficits with meeting this unmet need Patient is unable to independently navigate community resource options without care coordination support Clinical Goals:  explore community resource options for unmet needs related to:  Transportation, Social Connections, and Physical Activity Clinical Interventions:  Spoke with pt's daughter, [name], who indicated pt living alone and in need of in-home support. Pt may be eligible for the [facility] program as well discussed private duty caregiver arrangements which they are considering.  Pt appears to be socially isolated but daughter says she refuses to go places- could consider adult day care/senior center if she was willing to participate....  Pt has life alert and cancelled her Meals on Wheels, per daughter. Collaboration with Tonia Ghent, MD regarding development and update of comprehensive plan of care as evidenced by provider attestation and co-signature Inter-disciplinary care team collaboration (see longitudinal plan of care) Assessment of needs, barriers , agencies contacted, as well as how impacting  Review various resources, discussed options and provided patient information about Limited access to caregiver and Lacks knowledge of community resource:   Collaborated with appropriate clinical care team members regarding patient needs Social  Isolation, Limited access to caregiver, and Lacks knowledge of community resource:   , CAD and COPD Patient interviewed and appropriate assessments performed Referred patient to community resources care guide team for assistance with [employer] program, private duty care support Provided patient with information about private duty care Discussed plans with patient for ongoing care management follow up and provided patient with direct contact information for care management team Other interventions provided: Solution-Focused Strategies  Active listening / Reflection utilized  Emotional Support Provided Patient Goals:  - -consider hiring in-home private duty caregiver/companion -review [place] and Attendants program for eligibility - begin a notebook of services in my neighborhood or community - call 211 when I need some help - follow-up on any referrals for help I am given - think ahead to make sure my need does not become an emergency - make a note about what I need to have by the phone or take with me, like an identification card or social security number have a back-up plan - have a back-up plan - make a list of family or friends that I can call    Follow Up Plan: Appointment scheduled for SW follow up with client by phone on:  06/21/21      Consent to CCM Services: [name] was given information about Chronic Care Management services including:  CCM service includes personalized support from designated clinical staff supervised by her physician, including individualized plan of care and coordination with other care providers 24/7 contact phone numbers for assistance for urgent and routine care needs. Service will only be billed when office clinical staff spend 20 minutes or more in a month to coordinate care. Only one practitioner may furnish and bill the service in a calendar month. The  patient may stop CCM services at any time (effective at the end of the month) by  phone call to the office staff. The patient will be responsible for cost sharing (co-pay) of up to 20% of the service fee (after annual deductible is met).  Patient agreed to services and verbal consent obtained.   The patient verbalized understanding of instructions, educational materials, and care plan provided today and declined offer to receive copy of patient instructions, educational materials, and care plan.   Telephone follow up appointment with care management team member scheduled for:06/21/21  Eduard Clos MSW, LCSW Licensed Clinical Social Worker [place] [phone]

## 2021-05-17 NOTE — Chronic Care Management (AMB) (Signed)
Chronic Care Management    Clinical Social Work Note  05/17/2021 Name: [name] MRN: [id number] DOB: 1931-05-05  [name] is a 87 y.o. year old female who is a primary care patient of Tonia Ghent, MD. The CCM team was consulted to assist the patient with chronic disease management and/or care coordination needs related to: [employer] , Level of Care Concerns, and Caregiver Stress.   Engaged with patient by telephone for initial visit in response to provider referral for social work chronic care management and care coordination services.   Consent to Services:  The patient was given information about Chronic Care Management services, agreed to services, and gave verbal consent prior to initiation of services.  Please see initial visit note for detailed documentation.   Patient agreed to services and consent obtained.   Assessment: Review of patient past medical history, allergies, medications, and health status, including review of relevant consultants reports was performed today as part of a comprehensive evaluation and provision of chronic care management and care coordination services.     SDOH (Social Determinants of Health) assessments and interventions performed:  SDOH Interventions    Flowsheet Row Most Recent Value  SDOH Interventions   Food Insecurity Interventions Intervention Not Indicated  Social Connections Interventions Other (Comment)  [daughter reports pt will not agree to go to any day programs]        Advanced Directives Status: See Care Plan for related entries.  CCM Care Plan  Allergies  Allergen Reactions   Doxycycline Other (See Comments)    GI upset   Sulfadiazine Other (See Comments)    REACTION: Fever, aches   Sulfa Antibiotics Rash    Outpatient Encounter Medications as of 05/17/2021  Medication Sig   albuterol (PROAIR HFA) 108 (90 Base) MCG/ACT inhaler Inhale 1-2 puffs into the lungs every 6 (six) hours as  needed for wheezing or shortness of breath (Okay to fill with Ventolin or albuterol HFA).   alendronate (FOSAMAX) 70 MG tablet Take 70 mg by mouth once a week.   ALPRAZolam (XANAX) 1 MG tablet TAKE 1/2 TABLET BY MOUTH THREE TIMES DAILY AS NEEDED FOR ANXIETY   anastrozole (ARIMIDEX) 1 MG tablet Take 1 tablet (1 mg total) by mouth daily.   busPIRone (BUSPAR) 5 MG tablet TAKE ONE TABLET BY MOUTH IN THE MORNING AND IN THE EVENING   Calcium Carbonate (CALTRATE 600 PO) Take 600 mg by mouth daily.   cholecalciferol (VITAMIN D) 1000 units tablet Take 1 tablet (1,000 Units total) by mouth daily.   clopidogrel (PLAVIX) 75 MG tablet Take 1 tablet (75 mg total) by mouth daily. Please make overdue appt with Dr. Tamala Julian before anymore refills. Thank you 2nd attempt   diclofenac Sodium ([employer] DICLOFENAC SODIUM) 1 % GEL Apply 2 g topically 4 (four) times daily as needed.   DULoxetine (CYMBALTA) 60 MG capsule TAKE ONE CAPSULE BY MOUTH DAILY   ferrous sulfate (FERROUSUL) 325 (65 FE) MG tablet Take 1 tablet (325 mg total) by mouth daily with breakfast.   fluticasone (FLONASE) 50 MCG/ACT nasal spray PLACE 2 SPRAYS INTO BOTH NOSTRILS DAILY.   levothyroxine (SYNTHROID) 112 MCG tablet TAKE ONE TABLET BY MOUTH DAILY   levothyroxine (SYNTHROID) 125 MCG tablet TAKE ONE TABLET BY MOUTH DAILY   methocarbamol (ROBAXIN) 500 MG tablet TAKE 1 TABLET BY MOUTH 4 TIMES DAILY AS NEEDED FOR MUSCLE SPASMS.   Multiple Vitamin (MULTIVITAMIN WITH MINERALS) TABS tablet Take 1 tablet by mouth daily.   Multiple Vitamins-Minerals (  OCUVITE PRESERVISION PO) Take 1 tablet by mouth daily.   nitroGLYCERIN (NITROSTAT) 0.4 MG SL tablet Place 1 tablet (0.4 mg total) under the tongue every 5 (five) minutes as needed for chest pain.   oxybutynin (DITROPAN) 5 MG tablet Take 1 tablet (5 mg total) by mouth at bedtime as needed for bladder spasms.   OXYGEN Inhale 3-3.5 L into the lungs continuous.   pantoprazole (PROTONIX) 20 MG tablet TAKE ONE TABLET BY  MOUTH TWICE DAILY   rosuvastatin (CRESTOR) 10 MG tablet TAKE ONE TABLET BY MOUTH DAILY   tiotropium (SPIRIVA HANDIHALER) 18 MCG inhalation capsule PLACE 1 CAPSULE INTO INHALER AND INHALE BY MOUTH DAILY   traZODone (DESYREL) 50 MG tablet TAKE THREE TABLETS BY MOUTH AT BEDTIME AS NEEDED   [DISCONTINUED] bisoprolol (ZEBETA) 5 MG tablet Take 0.5 tablets (2.5 mg total) by mouth daily.   No facility-administered encounter medications on file as of 05/17/2021.    Patient Active Problem List   Diagnosis Date Noted   Generalized weakness 07/07/2020   Decreased appetite 09/20/2019   Lower GI bleeding 09/09/2019   DNR (do not resuscitate) 09/08/2019   Anemia 09/07/2019   Macrocytic anemia 09/07/2019   Jerking 09/07/2019   Fall at home, initial encounter 09/07/2019   History of CVA (cerebrovascular accident) 09/07/2019   History of breast cancer 09/07/2019   Pelvic pain 03/25/2019   Dysphagia 02/18/2019   Coronary artery disease involving native coronary artery of native heart with unstable angina pectoris ([place]) 01/30/2019   Presence of drug coated stent in right coronary artery 01/30/2019   Left main coronary artery disease - ~50-60% dLM (&70% dLAD) -- Plan Med Rx 01/30/2019   Ischemic cardiomyopathy 01/30/2019   Acute ST elevation myocardial infarction (STEMI) of inferior wall ([place]) 01/29/2019   AAA (abdominal aortic aneurysm) 12/31/2018   Malignant neoplasm of upper-outer quadrant of right breast in female, estrogen receptor positive ([place]) 09/26/2018   Breast mass 08/28/2018   Bilateral impacted cerumen 08/14/2017   Laryngopharyngeal reflux (LPR) 08/14/2017   Presbycusis of both ears 08/14/2017   Thoracic aortic aneurysm 04/10/2017   Abdominal pain 04/05/2017   Low back pain 01/01/2017   Medicare annual wellness visit, subsequent 09/26/2016   Hard of hearing 09/26/2016   Vitamin D deficiency 09/26/2016   Advance care planning 09/26/2016   Encounter for chronic pain management  07/26/2016   Edema 10/21/2014   Multinodular thyroid 07/07/2013   HTN (hypertension) 06/09/2013   Chronic respiratory failure ([place]) 05/13/2013   Cough 05/02/2013   Vertigo 12/31/2012   Acute blood loss anemia 12/13/2011   Osteoarthritis of right knee 08/27/2011   Gold C Copd with asthmatic bronchitis component and chronic resp failure 06/26/2011   Neck pain 04/02/2011   RISK OF FALLING 08/17/2010   Hypokalemia 05/24/2009   Anemia, unspecified 04/10/2008   Anxiety state 03/01/2008   Osteoporosis 06/23/2007   Chronic pain syndrome 12/26/2006   Hypothyroidism 12/24/2006   Hyperlipidemia with target LDL less than 70 12/24/2006   COMMON MIGRAINE 12/24/2006   GERD 12/24/2006   DEGENERATIVE DISC DISEASE 12/24/2006   Fibromyalgia 12/24/2006   Insomnia 12/24/2006   HYPERGLYCEMIA 12/24/2006    Conditions to be addressed/monitored: CAD and COPD; Limited social support, Transportation, Level of care concerns, Social Isolation, Limited access to caregiver, and Lacks knowledge of community resource:    Care Plan : LCSW Plan of Care  Updates made by Deirdre Peer, LCSW since 05/17/2021 12:00 AM     Problem: Social and Functional Symptoms  Long-Range Goal: Provide support and resources to optimize pt's social and functional abilities   Start Date: 05/17/2021  Expected End Date: 06/27/2021  This Visit's Progress: On track  Priority: High  Note:   Current barriers:   Patient in need of assistance with connecting to community resources for Limited social support, Transportation, Social Isolation, and Limited access to caregiver Acknowledges deficits with meeting this unmet need Patient is unable to independently navigate community resource options without care coordination support Clinical Goals:  explore community resource options for unmet needs related to:  Transportation, Social Connections, and Physical Activity Clinical Interventions:  Spoke with pt's daughter, [name], who  indicated pt living alone and in need of in-home support. Pt may be eligible for the [facility] program as well discussed private duty caregiver arrangements which they are considering.  Pt appears to be socially isolated but daughter says she refuses to go places- could consider adult day care/senior center if she was willing to participate....  Pt has life alert and cancelled her Meals on Wheels, per daughter. Collaboration with Tonia Ghent, MD regarding development and update of comprehensive plan of care as evidenced by provider attestation and co-signature Inter-disciplinary care team collaboration (see longitudinal plan of care) Assessment of needs, barriers , agencies contacted, as well as how impacting  Review various resources, discussed options and provided patient information about Limited access to caregiver and Lacks knowledge of community resource:   Collaborated with appropriate clinical care team members regarding patient needs Social Isolation, Limited access to caregiver, and Lacks knowledge of community resource:   , CAD and COPD Patient interviewed and appropriate assessments performed Referred patient to community resources care guide team for assistance with [employer] program, private duty care support Provided patient with information about private duty care Discussed plans with patient for ongoing care management follow up and provided patient with direct contact information for care management team Other interventions provided: Solution-Focused Strategies  Active listening / Reflection utilized  Emotional Support Provided Patient Goals:  - -consider hiring in-home private duty caregiver/companion -review [employer] and Attendants program for eligibility - begin a notebook of services in my neighborhood or community - call 211 when I need some help - follow-up on any referrals for help I am given - think ahead to make sure my need does not become an  emergency - make a note about what I need to have by the phone or take with me, like an identification card or social security number have a back-up plan - have a back-up plan - make a list of family or friends that I can call    Follow Up Plan: Appointment scheduled for SW follow up with client by phone on:  06/21/21      Follow Up Plan: Appointment scheduled for SW follow up with client by phone on: 06/21/21      Eduard Clos MSW, [place] Licensed Clinical Social Worker [place] [phone]

## 2021-05-18 ENCOUNTER — Telehealth: Payer: Self-pay | Admitting: Interventional Cardiology

## 2021-05-18 MED ORDER — CLOPIDOGREL BISULFATE 75 MG PO TABS: 75.0000 mg | ORAL_TABLET | Freq: Every day | ORAL | 0 refills | Status: DC

## 2021-05-18 NOTE — Telephone Encounter (Signed)
Pt's medication was sent to pt's pharmacy as requested. Confirmation received.  °

## 2021-05-18 NOTE — Telephone Encounter (Signed)
*  STAT* If patient is at the pharmacy, call can be transferred to refill team.   1. Which medications need to be refilled? (please list name of each medication and dose if known) clopidogrel (PLAVIX) 75 MG tablet  2. Which pharmacy/location (including street and city if local pharmacy) is medication to be sent to? [facility] - [place], [place] - [address]  3. Do they need a 30 day or 90 day supply? 30 day

## 2021-05-19 ENCOUNTER — Telehealth: Payer: Self-pay | Admitting: *Deleted

## 2021-05-19 NOTE — Telephone Encounter (Signed)
   Telephone encounter was:  Unsuccessful.  05/19/2021 Name: [name] MRN: [id number] DOB: 04-28-1931  Unsuccessful outbound call made today to assist with:   inhome care and social activities  Outreach Attempt:  1st Attempt  A HIPAA compliant voice message was left requesting a return call.  Instructed patient to call back at   Instructed patient to call back at [phone]  at their earliest convenience.  [place], Care Management  [phone] [address] , [place] [address] Email : [email or web address]

## 2021-05-23 ENCOUNTER — Telehealth: Payer: Self-pay | Admitting: *Deleted

## 2021-05-23 ENCOUNTER — Other Ambulatory Visit: Payer: Self-pay | Admitting: Family Medicine

## 2021-05-23 NOTE — Telephone Encounter (Signed)
   Telephone encounter was:  Unsuccessful.  05/23/2021 Name: Lekeshia Kram MRN: [id number] DOB: 1930/12/22  Unsuccessful outbound call made today to assist with:  Caregiver Stress and va benefits  Outreach Attempt:  2nd Attempt  A HIPAA compliant voice message was left requesting a return call.  Instructed patient to call back at   Instructed patient to call back at [phone]  at their earliest convenience. .  [place], Care Management  [phone] [address] , [place] [address] Email : [email or web address]

## 2021-05-24 ENCOUNTER — Telehealth: Payer: Self-pay | Admitting: *Deleted

## 2021-05-24 NOTE — Telephone Encounter (Signed)
   Telephone encounter was:  Unsuccessful.  05/24/2021 Name: [name] MRN: [id number] DOB: 10-09-1930  Unsuccessful outbound call made today to assist with:  Left another message on POA mailbox to provide this information to will try to send at least what LSW requested to send patient   Outreach Attempt:  3rd Attempt.  Referral closed unable to contact patient.  A HIPAA compliant voice message was left requesting a return call.  Instructed patient to call back at   Instructed patient to call back at [phone]  at their earliest convenience. .  [place], Care Management  [phone] [address] , [place] [address] Email : [email or web address]

## 2021-05-24 NOTE — Telephone Encounter (Signed)
Sent. Thanks.   

## 2021-05-24 NOTE — Telephone Encounter (Signed)
Refill request for methocarbamol 500 mg tablets  LOV - 11/01/20 Next OV - not scheduled Last refill - 02/10/21 #40/1

## 2021-05-30 ENCOUNTER — Telehealth: Payer: Medicare PPO

## 2021-06-07 DIAGNOSIS — J449 Chronic obstructive pulmonary disease, unspecified: Secondary | ICD-10-CM

## 2021-06-07 DIAGNOSIS — I10 Essential (primary) hypertension: Secondary | ICD-10-CM

## 2021-06-21 ENCOUNTER — Telehealth: Payer: Medicare PPO

## 2021-06-23 ENCOUNTER — Other Ambulatory Visit: Payer: Self-pay

## 2021-06-23 ENCOUNTER — Telehealth: Payer: Self-pay | Admitting: *Deleted

## 2021-06-23 NOTE — Telephone Encounter (Signed)
°  Care Management   Follow Up Note  Late Entry 06/21/2021   Name: [name] MRN: [id number] DOB: 08/26/1930   Referred by: Tonia Ghent, MD Reason for referral : No chief complaint on file.   An unsuccessful telephone outreach was attempted today. The patient was referred to the case management team for assistance with care management and care coordination.   Follow Up Plan: The care management team will reach out to the patient again over the next 14 days.  Eduard Clos MSW, LCSW Licensed Clinical Social Worker [place] [phone]

## 2021-06-27 ENCOUNTER — Telehealth: Payer: Self-pay | Admitting: *Deleted

## 2021-06-27 ENCOUNTER — Telehealth: Payer: Medicare PPO

## 2021-06-27 NOTE — Telephone Encounter (Signed)
°  Care Management   Follow Up Note   06/27/2021 Name: [name] MRN: [id number] DOB: [birth year]   Referred by: Tonia Ghent, MD Reason for referral : No chief complaint on file.   A second unsuccessful telephone outreach was attempted today. The patient was referred to the case management team for assistance with care management and care coordination.   Follow Up Plan: The care management team will reach out to the patient again over the next 10 days.   Eduard Clos MSW, LCSW Licensed Clinical Social Worker [place] [phone]

## 2021-06-29 ENCOUNTER — Other Ambulatory Visit: Payer: Self-pay | Admitting: Family Medicine

## 2021-07-04 ENCOUNTER — Other Ambulatory Visit: Payer: Self-pay | Admitting: Family Medicine

## 2021-07-04 NOTE — Telephone Encounter (Signed)
Sent. Thanks.  Please get update on patient.

## 2021-07-04 NOTE — Telephone Encounter (Signed)
Refill request for Alprazolam 1 mg tablets  LOV - 11/01/20 Next OV - not scheduled Last refill - 05/16/21 #45/1

## 2021-07-06 DIAGNOSIS — M1990 Unspecified osteoarthritis, unspecified site: Secondary | ICD-10-CM | POA: Diagnosis not present

## 2021-07-06 DIAGNOSIS — I252 Old myocardial infarction: Secondary | ICD-10-CM | POA: Diagnosis not present

## 2021-07-06 DIAGNOSIS — R32 Unspecified urinary incontinence: Secondary | ICD-10-CM | POA: Diagnosis not present

## 2021-07-06 DIAGNOSIS — C792 Secondary malignant neoplasm of skin: Secondary | ICD-10-CM | POA: Diagnosis not present

## 2021-07-06 DIAGNOSIS — M545 Low back pain, unspecified: Secondary | ICD-10-CM | POA: Diagnosis not present

## 2021-07-06 DIAGNOSIS — K219 Gastro-esophageal reflux disease without esophagitis: Secondary | ICD-10-CM | POA: Diagnosis not present

## 2021-07-06 DIAGNOSIS — F3341 Major depressive disorder, recurrent, in partial remission: Secondary | ICD-10-CM | POA: Diagnosis not present

## 2021-07-06 DIAGNOSIS — R030 Elevated blood-pressure reading, without diagnosis of hypertension: Secondary | ICD-10-CM | POA: Diagnosis not present

## 2021-07-06 DIAGNOSIS — J449 Chronic obstructive pulmonary disease, unspecified: Secondary | ICD-10-CM | POA: Diagnosis not present

## 2021-07-06 DIAGNOSIS — F411 Generalized anxiety disorder: Secondary | ICD-10-CM | POA: Diagnosis not present

## 2021-07-06 DIAGNOSIS — C50911 Malignant neoplasm of unspecified site of right female breast: Secondary | ICD-10-CM | POA: Diagnosis not present

## 2021-07-06 DIAGNOSIS — G4700 Insomnia, unspecified: Secondary | ICD-10-CM | POA: Diagnosis not present

## 2021-07-06 DIAGNOSIS — I25119 Atherosclerotic heart disease of native coronary artery with unspecified angina pectoris: Secondary | ICD-10-CM | POA: Diagnosis not present

## 2021-07-06 DIAGNOSIS — I739 Peripheral vascular disease, unspecified: Secondary | ICD-10-CM | POA: Diagnosis not present

## 2021-07-06 DIAGNOSIS — I255 Ischemic cardiomyopathy: Secondary | ICD-10-CM | POA: Diagnosis not present

## 2021-07-06 DIAGNOSIS — Z604 Social exclusion and rejection: Secondary | ICD-10-CM | POA: Diagnosis not present

## 2021-07-06 DIAGNOSIS — F03A0 Unspecified dementia, mild, without behavioral disturbance, psychotic disturbance, mood disturbance, and anxiety: Secondary | ICD-10-CM | POA: Diagnosis not present

## 2021-07-06 DIAGNOSIS — M810 Age-related osteoporosis without current pathological fracture: Secondary | ICD-10-CM | POA: Diagnosis not present

## 2021-07-06 NOTE — Telephone Encounter (Signed)
Spoke with patients daughter [name] today and she states patient is doing okay. Patient had a visit from a Dr. Chrissie Noa from medicare that came out to do a home visit with her 1-2 weeks ago. He is suggesting to the patient and the family that she needs some help at home. This has been requested many times but patient always turns everything down or away. Dr. Chrissie Noa suggested they put cameras in her home to keep a check on her, live with her or her live with one of the daughters or patient go live in assisted living. Patient will not do any of the options that was suggested nor will she let any caregivers come into the house. [name] was wanting to know if Dr. Damita Dunnings could call patient and speak with her about these things. [name] is at the point she really does not know what to do to help her when she will not accept any help.

## 2021-07-11 ENCOUNTER — Other Ambulatory Visit: Payer: Self-pay | Admitting: Family Medicine

## 2021-07-12 NOTE — Telephone Encounter (Addendum)
I called but couldn't get through.  Please let [name] know that I tried.  The patient is entitled to make her own decisions about her care so I have to defer to the patient at this point.  Thanks.

## 2021-07-13 ENCOUNTER — Telehealth: Payer: Self-pay | Admitting: *Deleted

## 2021-07-13 NOTE — Chronic Care Management (AMB) (Signed)
°  Care Management   Note  07/13/2021 Name: [name] MRN: [id number] DOB: [birth year]  [name] is a 88 y.o. year old female who is a primary care patient of Tonia Ghent, MD and is actively engaged with the care management team. I reached out to [name] by phone today to assist with re-scheduling a follow up visit with the Licensed Clinical Social Worker  Follow up plan: Unsuccessful telephone outreach attempt made. A HIPAA compliant phone message was left for the patient providing contact information and requesting a return call.   Julian Hy, [place] Management  Direct Dial: [phone]

## 2021-07-13 NOTE — Telephone Encounter (Signed)
Pt daughter returning your call.

## 2021-07-18 ENCOUNTER — Other Ambulatory Visit: Payer: Self-pay | Admitting: Family Medicine

## 2021-07-18 ENCOUNTER — Telehealth: Payer: Self-pay | Admitting: Family Medicine

## 2021-07-18 NOTE — Telephone Encounter (Signed)
Request was received today but was not on her medication list anymore. Okay to refill for patient?

## 2021-07-18 NOTE — Telephone Encounter (Signed)
[name] called in and is requesting a refill but I didn't see the medication on her list.  Encourage patient to contact the pharmacy for refills or they can request refills through [place]:  Please schedule appointment if longer than 1 year  NEXT APPOINTMENT DATE:  MEDICATION: Mirtazapine 7.5 mg  Is the patient out of medication? yes  PHARMACY: [facility]  Let patient know to contact pharmacy at the end of the day to make sure medication is ready.  Please notify patient to allow 48-72 hours to process  CLINICAL FILLS OUT ALL BELOW:   LAST REFILL:  QTY:  REFILL DATE:    OTHER COMMENTS:    Okay for refill?  Please advise

## 2021-07-19 NOTE — Telephone Encounter (Signed)
LMTCB

## 2021-07-19 NOTE — Telephone Encounter (Signed)
Would deny.  She has been off med.  Thanks.

## 2021-07-20 NOTE — Telephone Encounter (Signed)
Spoke with patient and she stated that she has been on this medication and never stopped it. Patient states she needs it to sleep and trazodone does not help.

## 2021-07-21 MED ORDER — MIRTAZAPINE 7.5 MG PO TABS: 7.5000 mg | ORAL_TABLET | Freq: Every day | ORAL | 1 refills | Status: DC

## 2021-07-21 NOTE — Telephone Encounter (Signed)
Sent. Thanks.   

## 2021-07-21 NOTE — Addendum Note (Signed)
Addended by: Tonia Ghent on: 07/21/2021 07:39 AM   Modules accepted: Orders

## 2021-07-21 NOTE — Telephone Encounter (Signed)
Left message for patient that rx was sent in.

## 2021-07-31 NOTE — Chronic Care Management (AMB) (Signed)
°  Care Management   Note  07/31/2021 Name: Jahnasia Tatum MRN: [id number] DOB: 1930/10/14  Omunique Pederson is a 88 y.o. year old female who is a primary care patient of Tonia Ghent, MD and is actively engaged with the care management team. I reached out to [name] by phone today to assist with re-scheduling a follow up visit with the Licensed Clinical Social Worker  Follow up plan: Pt declined to schedule at this time says she will call back to reschedule   Julian Hy, [place] Management  Direct Dial: [phone]

## 2021-08-28 ENCOUNTER — Other Ambulatory Visit: Payer: Self-pay | Admitting: Family Medicine

## 2021-08-28 NOTE — Telephone Encounter (Signed)
Refill request for Alprazolam 1 mg tab  LOV - 11/01/20 Next OV - not scheduled  Last refill - 07/04/21 #45/1

## 2021-08-28 NOTE — Telephone Encounter (Signed)
Patient is overdue for a f/u; please call to schedule patient an appt

## 2021-08-28 NOTE — Chronic Care Management (AMB) (Signed)
°  Care Management   Note  08/28/2021 Name: [name] MRN: [id number] DOB: [birth year]  [name] is a 88 y.o. year old female who is a primary care patient of Tonia Ghent, MD and is actively engaged with the care management team. I reached out to [name] by phone today to assist with re-scheduling a follow up visit with the Licensed Clinical Social Worker  Follow up plan: 2nd Unsuccessful telephone outreach attempt made. A HIPAA compliant phone message was left for the patient providing contact information and requesting a return call.   Julian Hy, [place] Management  Direct Dial: [phone]

## 2021-08-30 ENCOUNTER — Emergency Department (HOSPITAL_COMMUNITY)
Admission: EM | Admit: 2021-08-30 | Discharge: 2021-08-30 | Disposition: A | Discharge status: Home / Self Care | Payer: Medicare PPO | Attending: Emergency Medicine | Admitting: Emergency Medicine

## 2021-08-30 ENCOUNTER — Emergency Department (HOSPITAL_COMMUNITY): Payer: Medicare PPO

## 2021-08-30 DIAGNOSIS — I129 Hypertensive chronic kidney disease with stage 1 through stage 4 chronic kidney disease, or unspecified chronic kidney disease: Secondary | ICD-10-CM | POA: Diagnosis not present

## 2021-08-30 DIAGNOSIS — Z79899 Other long term (current) drug therapy: Secondary | ICD-10-CM | POA: Insufficient documentation

## 2021-08-30 DIAGNOSIS — Z853 Personal history of malignant neoplasm of breast: Secondary | ICD-10-CM | POA: Insufficient documentation

## 2021-08-30 DIAGNOSIS — W01198A Fall on same level from slipping, tripping and stumbling with subsequent striking against other object, initial encounter: Secondary | ICD-10-CM | POA: Insufficient documentation

## 2021-08-30 DIAGNOSIS — J449 Chronic obstructive pulmonary disease, unspecified: Secondary | ICD-10-CM | POA: Diagnosis not present

## 2021-08-30 DIAGNOSIS — J45909 Unspecified asthma, uncomplicated: Secondary | ICD-10-CM | POA: Insufficient documentation

## 2021-08-30 DIAGNOSIS — N189 Chronic kidney disease, unspecified: Secondary | ICD-10-CM | POA: Diagnosis not present

## 2021-08-30 DIAGNOSIS — R6889 Other general symptoms and signs: Secondary | ICD-10-CM | POA: Diagnosis not present

## 2021-08-30 DIAGNOSIS — Z743 Need for continuous supervision: Secondary | ICD-10-CM | POA: Diagnosis not present

## 2021-08-30 DIAGNOSIS — Z96611 Presence of right artificial shoulder joint: Secondary | ICD-10-CM | POA: Diagnosis not present

## 2021-08-30 DIAGNOSIS — Z96651 Presence of right artificial knee joint: Secondary | ICD-10-CM | POA: Insufficient documentation

## 2021-08-30 DIAGNOSIS — S0093XA Contusion of unspecified part of head, initial encounter: Secondary | ICD-10-CM | POA: Insufficient documentation

## 2021-08-30 DIAGNOSIS — S0990XA Unspecified injury of head, initial encounter: Secondary | ICD-10-CM | POA: Diagnosis not present

## 2021-08-30 DIAGNOSIS — G319 Degenerative disease of nervous system, unspecified: Secondary | ICD-10-CM | POA: Diagnosis not present

## 2021-08-30 DIAGNOSIS — R519 Headache, unspecified: Secondary | ICD-10-CM | POA: Insufficient documentation

## 2021-08-30 DIAGNOSIS — Z7951 Long term (current) use of inhaled steroids: Secondary | ICD-10-CM | POA: Diagnosis not present

## 2021-08-30 DIAGNOSIS — W19XXXA Unspecified fall, initial encounter: Secondary | ICD-10-CM | POA: Diagnosis not present

## 2021-08-30 DIAGNOSIS — E039 Hypothyroidism, unspecified: Secondary | ICD-10-CM | POA: Diagnosis not present

## 2021-08-30 DIAGNOSIS — I639 Cerebral infarction, unspecified: Secondary | ICD-10-CM | POA: Diagnosis not present

## 2021-08-30 DIAGNOSIS — Z7902 Long term (current) use of antithrombotics/antiplatelets: Secondary | ICD-10-CM | POA: Insufficient documentation

## 2021-08-30 DIAGNOSIS — I6782 Cerebral ischemia: Secondary | ICD-10-CM | POA: Diagnosis not present

## 2021-08-30 LAB — CBC WITH DIFFERENTIAL/PLATELET
Abs Immature Granulocytes: 0.01 10*3/uL (ref 0.00–0.07)
Basophils Absolute: 0 10*3/uL (ref 0.0–0.1)
Basophils Relative: 1 %
Eosinophils Absolute: 0.1 10*3/uL (ref 0.0–0.5)
Eosinophils Relative: 3 %
HCT: 35.1 % — ABNORMAL LOW (ref 36.0–46.0)
Hemoglobin: 10.8 g/dL — ABNORMAL LOW (ref 12.0–15.0)
Immature Granulocytes: 0 %
Lymphocytes Relative: 30 %
Lymphs Abs: 1.1 10*3/uL (ref 0.7–4.0)
MCH: 31.5 pg (ref 26.0–34.0)
MCHC: 30.8 g/dL (ref 30.0–36.0)
MCV: 102.3 fL — ABNORMAL HIGH (ref 80.0–100.0)
Monocytes Absolute: 0.2 10*3/uL (ref 0.1–1.0)
Monocytes Relative: 6 %
Neutro Abs: 2.1 10*3/uL (ref 1.7–7.7)
Neutrophils Relative %: 60 %
Platelets: 175 10*3/uL (ref 150–400)
RBC: 3.43 MIL/uL — ABNORMAL LOW (ref 3.87–5.11)
RDW: 13.3 % (ref 11.5–15.5)
WBC: 3.6 10*3/uL — ABNORMAL LOW (ref 4.0–10.5)
nRBC: 0 % (ref 0.0–0.2)

## 2021-08-30 LAB — BASIC METABOLIC PANEL
Anion gap: 9 (ref 5–15)
BUN: 10 mg/dL (ref 8–23)
CO2: 35 mmol/L — ABNORMAL HIGH (ref 22–32)
Calcium: 8.8 mg/dL — ABNORMAL LOW (ref 8.9–10.3)
Chloride: 99 mmol/L (ref 98–111)
Creatinine, Ser: 0.63 mg/dL (ref 0.44–1.00)
GFR, Estimated: >60 mL/min (ref >60)
Glucose, Bld: 94 mg/dL (ref 70–99)
Potassium: 4.2 mmol/L (ref 3.5–5.1)
Sodium: 143 mmol/L (ref 135–145)

## 2021-08-30 NOTE — Telephone Encounter (Signed)
Sent. Thanks.   

## 2021-08-30 NOTE — ED Provider Notes (Signed)
°  Physical Exam  BP (!) 172/76    Pulse (!) 54    Temp 98.2 F (36.8 C) (Oral)    Resp 19    Ht 5' (1.524 m)    Wt 61.2 kg    SpO2 98%    BMI 26.37 kg/m   Physical Exam Vitals and nursing note reviewed.  Constitutional:      General: She is not in acute distress.    Appearance: She is not ill-appearing, toxic-appearing or diaphoretic.  HENT:     Head: Normocephalic.  Eyes:     General: No scleral icterus.       Right eye: No discharge.        Left eye: No discharge.  Cardiovascular:     Rate and Rhythm: Normal rate.  Pulmonary:     Effort: Pulmonary effort is normal.  Skin:    General: Skin is warm and dry.  Neurological:     General: No focal deficit present.     Mental Status: She is alert.     GCS: GCS eye subscore is 4. GCS verbal subscore is 5. GCS motor subscore is 6.  Psychiatric:        Behavior: Behavior is cooperative.    Procedures  Procedures  ED Course / MDM    Medical Decision Making Amount and/or Complexity of Data Reviewed Labs: ordered. Radiology: ordered.   Patient's daughter at bedside requested lab work due to history of anemia.  Lab work pending at time of handoff.  I personally viewed and interpreted the lab results.  Pertinent findings include: -CO2 35, this is improved from previous results, as prior -Anemia however improved from previous. -Leukopenia at 3.6; platelets within normal limits  We will stand and ambulate patient.  If able to ambulate will discharge to follow-up with primary care provider in outpatient setting.  Discussed symptomatic treatment with patient and patient's daughter.  Discussed results, findings, treatment and follow up. Patient advised of return precautions. Patient verbalized understanding and agreed with plan.       Loni Beckwith, PA-C 08/31/21 0355    Gareth Morgan, MD 08/31/21 2225

## 2021-08-30 NOTE — Progress Notes (Signed)
°   08/30/21 1600  Clinical Encounter Type  Visited With Patient not available  Visit Type Initial;ED  Referral From Nurse  Consult/Referral To None   Chaplain responded to a level two trauma that was downgraded shortly after the patient's arrival.  Patient was receiving care from the medical team. Family member is on her way but not arrived.   Jennette Resident  [facility] [phone]

## 2021-08-30 NOTE — ED Notes (Signed)
Patient ambulated to nurses station using walker with minimal assistance. Patient states that she prefers her own walker. Patient assisted to wheelchair to discharge with family.

## 2021-08-30 NOTE — ED Notes (Signed)
Patient transported to CT with TRN.  

## 2021-08-30 NOTE — ED Provider Notes (Addendum)
[facility] EMERGENCY DEPARTMENT Provider Note   CSN: [id number] Arrival date & time: 08/30/21  1558     History  Chief Complaint  Patient presents with   Fall    LVL 2    [name] is a 88 y.o. female.  Pt fell and hit the back the back of her head.  Pt reports she was leaning over and slipped and fell.  Pt reports she hit the back of her head hard.    The history is provided by the patient. No language interpreter was used.  Fall This is a new problem. The current episode started 12 to 24 hours ago. The problem occurs constantly. The problem has not changed since onset.Nothing aggravates the symptoms. Nothing relieves the symptoms. She has tried nothing for the symptoms. The treatment provided no relief.      Home Medications Prior to Admission medications   Medication Sig Start Date End Date Taking? Authorizing Provider  albuterol (PROAIR HFA) 108 (90 Base) MCG/ACT inhaler Inhale 1-2 puffs into the lungs every 6 (six) hours as needed for wheezing or shortness of breath (Okay to fill with Ventolin or albuterol HFA). 10/30/19   Tonia Ghent, MD  alendronate (FOSAMAX) 70 MG tablet Take 70 mg by mouth once a week. 07/19/20   [provider]  ALPRAZolam Duanne Moron) 1 MG tablet TAKE 1/2 TABLET BY MOUTH THREE TIMES DAILY AS NEEDED FOR ANXIETY 07/04/21   Tonia Ghent, MD  anastrozole (ARIMIDEX) 1 MG tablet Take 1 tablet (1 mg total) by mouth daily. 10/05/20   Magrinat, Virgie Dad, MD  busPIRone (BUSPAR) 5 MG tablet TAKE ONE TABLET BY MOUTH IN THE MORNING AND IN THE EVENING 06/29/21   Tonia Ghent, MD  Calcium Carbonate (CALTRATE 600 PO) Take 600 mg by mouth daily.    [provider]  cholecalciferol (VITAMIN D) 1000 units tablet Take 1 tablet (1,000 Units total) by mouth daily. 09/25/16   Tonia Ghent, MD  clopidogrel (PLAVIX) 75 MG tablet Take 1 tablet (75 mg total) by mouth daily. Please make overdue appt with Dr. Tamala Julian before  anymore refills. Thank you 3rd and Final attempt 05/18/21   Belva Crome, MD  diclofenac Sodium ([employer] DICLOFENAC SODIUM) 1 % GEL Apply 2 g topically 4 (four) times daily as needed. 11/16/20   Tonia Ghent, MD  DULoxetine (CYMBALTA) 60 MG capsule TAKE ONE CAPSULE BY MOUTH DAILY 04/18/21   Tonia Ghent, MD  ferrous sulfate (FERROUSUL) 325 (65 FE) MG tablet Take 1 tablet (325 mg total) by mouth daily with breakfast. 08/22/18   Tonia Ghent, MD  fluticasone (FLONASE) 50 MCG/ACT nasal spray PLACE 2 SPRAYS INTO BOTH NOSTRILS DAILY. 01/28/20   Tonia Ghent, MD  levothyroxine (SYNTHROID) 112 MCG tablet TAKE ONE TABLET BY MOUTH DAILY 04/18/21   Tonia Ghent, MD  levothyroxine (SYNTHROID) 125 MCG tablet TAKE ONE TABLET BY MOUTH DAILY 11/16/20   Tonia Ghent, MD  methocarbamol (ROBAXIN) 500 MG tablet Take 1 tablet (500 mg total) by mouth every 6 (six) hours as needed for muscle spasms. Please contact clinic about follow up with palliative care 05/24/21   Tonia Ghent, MD  mirtazapine (REMERON) 7.5 MG tablet Take 1 tablet (7.5 mg total) by mouth at bedtime. 07/21/21   Tonia Ghent, MD  Multiple Vitamin (MULTIVITAMIN WITH MINERALS) TABS tablet Take 1 tablet by mouth daily.    [provider]  Multiple Vitamins-Minerals (OCUVITE PRESERVISION PO)  Take 1 tablet by mouth daily.    [provider]  nitroGLYCERIN (NITROSTAT) 0.4 MG SL tablet Place 1 tablet (0.4 mg total) under the tongue every 5 (five) minutes as needed for chest pain. 02/23/20   Belva Crome, MD  oxybutynin (DITROPAN) 5 MG tablet Take 1 tablet (5 mg total) by mouth at bedtime as needed for bladder spasms. 03/20/21   Tonia Ghent, MD  OXYGEN Inhale 3-3.5 L into the lungs continuous.    [provider]  pantoprazole (PROTONIX) 20 MG tablet TAKE ONE TABLET BY MOUTH TWICE DAILY 03/26/21   Tonia Ghent, MD  rosuvastatin (CRESTOR) 10 MG tablet TAKE ONE TABLET BY MOUTH DAILY 03/26/21   Tonia Ghent, MD  tiotropium (SPIRIVA HANDIHALER) 18 MCG inhalation capsule PLACE 1 CAPSULE INTO INHALER AND INHALE BY MOUTH DAILY 03/21/21   Tonia Ghent, MD  bisoprolol (ZEBETA) 5 MG tablet Take 0.5 tablets (2.5 mg total) by mouth daily. 01/31/19 09/18/19  Belva Crome, MD      Allergies    Doxycycline, Sulfadiazine, and Sulfa antibiotics    Review of Systems   Review of Systems  All other systems reviewed and are negative.  Physical Exam Updated Vital Signs BP (!) 160/90    Pulse 65    Temp 98.2 F (36.8 C) (Oral)    Resp 16    Ht 5' (1.524 m)    Wt 61.2 kg    SpO2 97%    BMI 26.37 kg/m  Physical Exam Vitals and nursing note reviewed.  Constitutional:      Appearance: She is well-developed.  HENT:     Head: Normocephalic.     Mouth/Throat:     Mouth: Mucous membranes are moist.  Eyes:     Extraocular Movements: Extraocular movements intact.     Pupils: Pupils are equal, round, and reactive to light.  Cardiovascular:     Rate and Rhythm: Normal rate and regular rhythm.     Pulses: Normal pulses.  Pulmonary:     Effort: Pulmonary effort is normal.  Abdominal:     General: Abdomen is flat. There is no distension.  Musculoskeletal:        General: Normal range of motion.     Cervical back: Normal range of motion.  Skin:    General: Skin is warm.  Neurological:     General: No focal deficit present.     Mental Status: She is alert and oriented to person, place, and time.  Psychiatric:        Mood and Affect: Mood normal.        Behavior: Behavior normal.    ED Results / Procedures / Treatments   Labs (all labs ordered are listed, but only abnormal results are displayed) Labs Reviewed - No data to display  EKG None  Radiology CT Head Wo Contrast  Result Date: 08/30/2021 CLINICAL DATA:  Head trauma. EXAM: CT HEAD WITHOUT CONTRAST TECHNIQUE: Contiguous axial images were obtained from the base of the skull through the vertex without intravenous contrast. RADIATION  DOSE REDUCTION: This exam was performed according to the departmental dose-optimization program which includes automated exposure control, adjustment of the mA and/or kV according to patient size and/or use of iterative reconstruction technique. COMPARISON:  Head CT 07/07/2020 and MRI 09/07/2019 FINDINGS: Brain: There is no evidence of an acute infarct, intracranial hemorrhage, mass, or midline shift. A small chronic left cerebellar infarct is again noted. Confluent hypodensities in the cerebral white matter  bilaterally are unchanged and nonspecific but compatible with extensive chronic small vessel ischemic disease. Asymmetric extra-axial CSF over the left frontal convexity measures 1 cm in thickness and may reflect a small subdural hygroma, unchanged. CSF overlying the left greater than right cerebellar hemispheres is also unchanged and could also reflect small subdural hygromas. There is moderate cerebral atrophy. Vascular: Calcified atherosclerosis at the skull base. No hyperdense vessel. Skull: No acute fracture or suspicious osseous lesion. Sinuses/Orbits: Mild mucosal thickening in the frontal and ethmoid sinuses. Small volume fluid in the sphenoid sinuses. Moderate bilateral mastoid effusions, mildly larger than on the prior CT. Bilateral cataract extraction. Other: None. IMPRESSION: 1. No evidence of an acute intracranial abnormality. 2. Extensive chronic small vessel ischemic disease. 3. Unchanged suspected small subdural hygromas. 4. Moderate bilateral mastoid effusions. Electronically Signed   By: Logan Bores M.D.   On: 08/30/2021 16:53    Procedures Procedures    Medications Ordered in ED Medications - No data to display  ED Course/ Medical Decision Making/ A&P                           Medical Decision Making Pt was bending over and lost her balance   Problems Addressed: Contusion of head, unspecified part of head, initial encounter: acute illness or injury  Amount and/or Complexity  of Data Reviewed External Data Reviewed: labs and notes. Labs: ordered. Radiology: ordered.    Details: Ct head  no acute injury  Risk OTC drugs.   MDM: Ct head  no acute abnormality.   Pt's daughter wants her to have blood work while she is here because pt does not go to the doctor.  I will check at cbc and bmet         Final Clinical Impression(s) / ED Diagnoses Final diagnoses:  Fall, initial encounter  Contusion of head, unspecified part of head, initial encounter    Rx / DC Orders ED Discharge Orders     None         Sidney Ace 08/30/21 1819    Fransico Meadow, PA-C 08/30/21 1841    Fransico Meadow, PA-C 08/30/21 1857    Isla Pence, MD 08/31/21 [phone]

## 2021-08-30 NOTE — ED Notes (Signed)
Trauma Response Nurse Documentation   Nohealani Medinger is a 88 y.o. female arriving to [facility] ED via EMS  On Brilinta (ticagrelor) 90 mg bid. Trauma was activated as a Level 2 based on the following trauma criteria Elderly patients > 65 with head trauma on anti-coagulation (excluding ASA). Trauma team at the bedside on patient arrival. Patient to CT with team. GCS 15.  History   Past Medical History:  Diagnosis Date   Abdominal pain 04/05/2017   Allergic rhinitis, cause unspecified    Allergy, unspecified not elsewhere classified    Anemia, unspecified 04/10/2008   Qualifier: Diagnosis of  By: Linna Darner MD, William     Anxiety    Anxiety state 03/01/2008   Qualifier: Diagnosis of  By: Council Mechanic MD, Hilaria Ota    Asthma    Breast cancer ([place]) 08/2018   right breast   Breast mass 08/28/2018   Chronic kidney disease    frequency   COMMON MIGRAINE 12/24/2006   Qualifier: Diagnosis of  By: Fuller Plan CMA (AAMA), Lugene     COPD (chronic obstructive pulmonary disease) ([place])    Degeneration of intervertebral disc, site unspecified    Diverticulosis of colon (without mention of hemorrhage)    DNR (do not resuscitate) 09/08/2019   Dysphagia 02/18/2019   Encounter for chronic pain management 07/26/2016   Indication for chronic opioid: chronic back pain Medication and dose:  Oxycodone 10mg  TID # pills per month: 90 Last UDS date: 04/15/15 Pain contract signed (Y/N):  yes Date narcotic database last reviewed (include red flags):  05/06/17   Esophageal reflux    Fibromyalgia 12/24/2006   Qualifier: Diagnosis of  By: Fuller Plan CMA (AAMA), Lugene     Headache    HTN (hypertension) 06/09/2013   Hyperlipidemia with target LDL less than 70 12/24/2006   Qualifier: Diagnosis of  By: Fuller Plan CMA (AAMA), Lugene     Hypokalemia 05/24/2009   Qualifier: Diagnosis of  By: Lacretia Nicks     Hypothyroidism 12/24/2006   Qualifier: Diagnosis of  By: Fuller Plan CMA (AAMA), Lugene     Insomnia 12/24/2006   she had trials  of antihistamines and TCAs prev w/o effect.      Insomnia, unspecified    Lower GI bleeding 09/09/2019   Myalgia and myositis, unspecified    Need for prophylactic hormone replacement therapy (postmenopausal)    Osteoarthritis of right knee 08/27/2011   R knee pain, s/p arthroscopy 2012 per [name]    Osteoporosis 06/23/2007   Qualifier: Diagnosis of  By: Council Mechanic MD, Hilaria Ota    Osteoporosis, unspecified    Other abnormal blood chemistry    Other and unspecified hyperlipidemia    Other chronic pain    Presence of drug coated stent in right coronary artery 01/30/2019   Stroke [facility])    mini strokes   Syncope 01/24/2017   Thoracic aortic aneurysm ([place]) 04/10/2017   Unspecified essential hypertension    Unspecified hypothyroidism    Vitamin D deficiency 09/26/2016     Past Surgical History:  Procedure Laterality Date   BACK SURGERY     breast cystectomy     CORONARY/GRAFT ACUTE MI REVASCULARIZATION N/A 01/29/2019   Procedure: CORONARY/GRAFT ACUTE MI REVASCULARIZATION;  Surgeon: Belva Crome, MD;  Location: [place] [facility];  Service: Cardiovascular;  Laterality: N/A;   JOINT REPLACEMENT     shoulder right   KNEE ARTHROSCOPY     Right knee 2012   MASS EXCISION Left 04/07/2015   Procedure: MINOR EXCISION OF  MASS LEFT SMALL FINGER;  Surgeon: Leanora Cover, MD;  Location: [place];  Service: Orthopedics;  Laterality: Left;   SHOULDER SURGERY     TOTAL KNEE ARTHROPLASTY  12/11/2011   Procedure: TOTAL KNEE ARTHROPLASTY;  Surgeon: Johnny Bridge, MD;  Location: [place];  Service: Orthopedics;  Laterality: Right;   UMBILICAL HERNIA REPAIR N/A 06/09/2013   Procedure: EXPLORATIORY LAPAROTOMY, HERNIA REPAIR UMBILICAL, INSERTION OF MESH;  Surgeon: Haywood Lasso, MD;  Location: [place];  Service: General;  Laterality: N/A;       Initial Focused Assessment (If applicable, or please see trauma documentation): Patient A&Ox4, GCS 15. States she 'falls a lot and hits her  head". Complaining of pain to the back of her head, no obvious bruising. No other trauma noted, no c-collar placed by EMS. No complaints of neck pain.  CT's Completed:   CT Head and CT C-Spine   Interventions:   Plan for disposition:  Discharge home pending ambulation and pain control.  Event Summary: Ambulate and discharge home.  Bedside handoff with ED RN Paden.    Park Pope Dela Sweeny  Trauma Response RN  Please call TRN at [phone] for further assistance.

## 2021-08-31 DIAGNOSIS — H353222 Exudative age-related macular degeneration, left eye, with inactive choroidal neovascularization: Secondary | ICD-10-CM | POA: Diagnosis not present

## 2021-08-31 DIAGNOSIS — H53412 Scotoma involving central area, left eye: Secondary | ICD-10-CM | POA: Diagnosis not present

## 2021-08-31 DIAGNOSIS — H353211 Exudative age-related macular degeneration, right eye, with active choroidal neovascularization: Secondary | ICD-10-CM | POA: Diagnosis not present

## 2021-08-31 DIAGNOSIS — H35363 Drusen (degenerative) of macula, bilateral: Secondary | ICD-10-CM | POA: Diagnosis not present

## 2021-08-31 DIAGNOSIS — H5315 Visual distortions of shape and size: Secondary | ICD-10-CM | POA: Diagnosis not present

## 2021-08-31 DIAGNOSIS — H35453 Secondary pigmentary degeneration, bilateral: Secondary | ICD-10-CM | POA: Diagnosis not present

## 2021-09-01 NOTE — Telephone Encounter (Signed)
LMTCB to scheduled

## 2021-09-07 ENCOUNTER — Telehealth: Payer: Self-pay | Admitting: Family Medicine

## 2021-09-07 NOTE — Telephone Encounter (Signed)
Called pt to schedule AWV with [facility]. But patients VMB was full. Will try to reach out to the patient again.  ?

## 2021-09-12 NOTE — Chronic Care Management (AMB) (Signed)
?  Care Management  ? ?Note ? ?09/12/2021 ?Name: [name] MRN: [id number] DOB: Jun 14, 1931 ? ?[name] is a 88 y.o. year old female who is a primary care patient of Tonia Ghent, MD and is actively engaged with the care management team. I reached out to [name] by phone today to assist with re-scheduling a follow up visit with the Licensed Clinical Social Worker ? ?Follow up plan: ?We have been unable to make contact with the patient for follow up. The care management team is available to follow up with the patient after provider conversation with the patient regarding recommendation for care management engagement and subsequent re-referral to the care management team.  ? ?Margerie Fraiser, CCMA ?Care Guide, Embedded Care Coordination ?[place]  Care Management  ?Direct Dial: [phone] ? ? ?

## 2021-09-22 ENCOUNTER — Other Ambulatory Visit: Payer: Self-pay | Admitting: Family Medicine

## 2021-09-22 NOTE — Telephone Encounter (Signed)
Patient overdue for appt; please call to schedule 

## 2021-09-22 NOTE — Telephone Encounter (Signed)
Refill request for ALPRAZolam Duanne Moron) 1 MG tablet ? ?LOV - 11/01/20 ?Next OV - not scheduled yet but sent to scheduling to get patient scheduled. ?Last refill - 08/30/21 #45/0 ? ?*Patient also needs Spiriva and buspirone refilled.  ?

## 2021-09-24 NOTE — Telephone Encounter (Signed)
Rx sent.  Please schedule.  See below.  Thanks.  ?

## 2021-09-25 ENCOUNTER — Observation Stay (HOSPITAL_COMMUNITY)
Admission: EM | Admit: 2021-09-25 | Discharge: 2021-09-27 | Disposition: A | Discharge status: Skilled Nursing Facility | Payer: Medicare PPO | Attending: Family Medicine | Admitting: Family Medicine

## 2021-09-25 ENCOUNTER — Emergency Department (HOSPITAL_COMMUNITY): Payer: Medicare PPO

## 2021-09-25 ENCOUNTER — Telehealth: Payer: Self-pay

## 2021-09-25 ENCOUNTER — Other Ambulatory Visit: Payer: Self-pay

## 2021-09-25 ENCOUNTER — Encounter (HOSPITAL_COMMUNITY): Payer: Self-pay | Admitting: Family Medicine

## 2021-09-25 DIAGNOSIS — Y92009 Unspecified place in unspecified non-institutional (private) residence as the place of occurrence of the external cause: Secondary | ICD-10-CM | POA: Diagnosis not present

## 2021-09-25 DIAGNOSIS — I10 Essential (primary) hypertension: Secondary | ICD-10-CM | POA: Diagnosis not present

## 2021-09-25 DIAGNOSIS — E876 Hypokalemia: Secondary | ICD-10-CM | POA: Diagnosis present

## 2021-09-25 DIAGNOSIS — Z853 Personal history of malignant neoplasm of breast: Secondary | ICD-10-CM | POA: Insufficient documentation

## 2021-09-25 DIAGNOSIS — D649 Anemia, unspecified: Secondary | ICD-10-CM | POA: Diagnosis present

## 2021-09-25 DIAGNOSIS — I77810 Thoracic aortic ectasia: Secondary | ICD-10-CM | POA: Diagnosis not present

## 2021-09-25 DIAGNOSIS — J45909 Unspecified asthma, uncomplicated: Secondary | ICD-10-CM | POA: Diagnosis not present

## 2021-09-25 DIAGNOSIS — M25572 Pain in left ankle and joints of left foot: Secondary | ICD-10-CM | POA: Diagnosis not present

## 2021-09-25 DIAGNOSIS — C50411 Malignant neoplasm of upper-outer quadrant of right female breast: Secondary | ICD-10-CM

## 2021-09-25 DIAGNOSIS — Z79899 Other long term (current) drug therapy: Secondary | ICD-10-CM | POA: Insufficient documentation

## 2021-09-25 DIAGNOSIS — R55 Syncope and collapse: Principal | ICD-10-CM | POA: Diagnosis present

## 2021-09-25 DIAGNOSIS — R748 Abnormal levels of other serum enzymes: Secondary | ICD-10-CM

## 2021-09-25 DIAGNOSIS — Z955 Presence of coronary angioplasty implant and graft: Secondary | ICD-10-CM | POA: Insufficient documentation

## 2021-09-25 DIAGNOSIS — Z96611 Presence of right artificial shoulder joint: Secondary | ICD-10-CM | POA: Diagnosis not present

## 2021-09-25 DIAGNOSIS — M25551 Pain in right hip: Secondary | ICD-10-CM | POA: Diagnosis not present

## 2021-09-25 DIAGNOSIS — E039 Hypothyroidism, unspecified: Secondary | ICD-10-CM | POA: Diagnosis present

## 2021-09-25 DIAGNOSIS — W19XXXA Unspecified fall, initial encounter: Secondary | ICD-10-CM

## 2021-09-25 DIAGNOSIS — Z8616 Personal history of COVID-19: Secondary | ICD-10-CM | POA: Diagnosis not present

## 2021-09-25 DIAGNOSIS — I712 Thoracic aortic aneurysm, without rupture, unspecified: Secondary | ICD-10-CM | POA: Diagnosis present

## 2021-09-25 DIAGNOSIS — I2510 Atherosclerotic heart disease of native coronary artery without angina pectoris: Secondary | ICD-10-CM | POA: Diagnosis present

## 2021-09-25 DIAGNOSIS — F411 Generalized anxiety disorder: Secondary | ICD-10-CM | POA: Diagnosis present

## 2021-09-25 DIAGNOSIS — R296 Repeated falls: Secondary | ICD-10-CM | POA: Diagnosis not present

## 2021-09-25 DIAGNOSIS — Z8673 Personal history of transient ischemic attack (TIA), and cerebral infarction without residual deficits: Secondary | ICD-10-CM

## 2021-09-25 DIAGNOSIS — Z96651 Presence of right artificial knee joint: Secondary | ICD-10-CM | POA: Insufficient documentation

## 2021-09-25 DIAGNOSIS — R778 Other specified abnormalities of plasma proteins: Secondary | ICD-10-CM | POA: Diagnosis not present

## 2021-09-25 DIAGNOSIS — J449 Chronic obstructive pulmonary disease, unspecified: Secondary | ICD-10-CM | POA: Diagnosis not present

## 2021-09-25 DIAGNOSIS — Z7902 Long term (current) use of antithrombotics/antiplatelets: Secondary | ICD-10-CM | POA: Insufficient documentation

## 2021-09-25 DIAGNOSIS — Z87891 Personal history of nicotine dependence: Secondary | ICD-10-CM | POA: Insufficient documentation

## 2021-09-25 DIAGNOSIS — I130 Hypertensive heart and chronic kidney disease with heart failure and stage 1 through stage 4 chronic kidney disease, or unspecified chronic kidney disease: Secondary | ICD-10-CM | POA: Diagnosis not present

## 2021-09-25 DIAGNOSIS — Z66 Do not resuscitate: Secondary | ICD-10-CM | POA: Diagnosis present

## 2021-09-25 DIAGNOSIS — I25110 Atherosclerotic heart disease of native coronary artery with unstable angina pectoris: Secondary | ICD-10-CM | POA: Insufficient documentation

## 2021-09-25 DIAGNOSIS — S299XXA Unspecified injury of thorax, initial encounter: Secondary | ICD-10-CM | POA: Diagnosis not present

## 2021-09-25 DIAGNOSIS — H9190 Unspecified hearing loss, unspecified ear: Secondary | ICD-10-CM

## 2021-09-25 DIAGNOSIS — S199XXA Unspecified injury of neck, initial encounter: Secondary | ICD-10-CM | POA: Diagnosis not present

## 2021-09-25 DIAGNOSIS — K219 Gastro-esophageal reflux disease without esophagitis: Secondary | ICD-10-CM | POA: Diagnosis present

## 2021-09-25 DIAGNOSIS — Z20822 Contact with and (suspected) exposure to covid-19: Secondary | ICD-10-CM | POA: Diagnosis not present

## 2021-09-25 DIAGNOSIS — S0990XA Unspecified injury of head, initial encounter: Secondary | ICD-10-CM | POA: Diagnosis not present

## 2021-09-25 DIAGNOSIS — F419 Anxiety disorder, unspecified: Secondary | ICD-10-CM | POA: Diagnosis present

## 2021-09-25 DIAGNOSIS — J9611 Chronic respiratory failure with hypoxia: Secondary | ICD-10-CM | POA: Diagnosis present

## 2021-09-25 DIAGNOSIS — R531 Weakness: Secondary | ICD-10-CM

## 2021-09-25 DIAGNOSIS — R102 Pelvic and perineal pain: Secondary | ICD-10-CM | POA: Diagnosis not present

## 2021-09-25 DIAGNOSIS — Z170 Estrogen receptor positive status [ER+]: Secondary | ICD-10-CM

## 2021-09-25 DIAGNOSIS — N189 Chronic kidney disease, unspecified: Secondary | ICD-10-CM | POA: Insufficient documentation

## 2021-09-25 DIAGNOSIS — R42 Dizziness and giddiness: Secondary | ICD-10-CM

## 2021-09-25 DIAGNOSIS — I5032 Chronic diastolic (congestive) heart failure: Secondary | ICD-10-CM | POA: Diagnosis not present

## 2021-09-25 DIAGNOSIS — R519 Headache, unspecified: Secondary | ICD-10-CM | POA: Diagnosis not present

## 2021-09-25 DIAGNOSIS — S8991XA Unspecified injury of right lower leg, initial encounter: Secondary | ICD-10-CM | POA: Diagnosis not present

## 2021-09-25 LAB — RAPID URINE DRUG SCREEN, HOSP PERFORMED
Amphetamines: NOT DETECTED
Barbiturates: NOT DETECTED
Benzodiazepines: POSITIVE — AB
Cocaine: NOT DETECTED
Opiates: NOT DETECTED
Tetrahydrocannabinol: NOT DETECTED

## 2021-09-25 LAB — CBC WITH DIFFERENTIAL/PLATELET
Abs Immature Granulocytes: 0.06 10*3/uL (ref 0.00–0.07)
Basophils Absolute: 0 10*3/uL (ref 0.0–0.1)
Basophils Relative: 0 %
Eosinophils Absolute: 0 10*3/uL (ref 0.0–0.5)
Eosinophils Relative: 0 %
HCT: 33.6 % — ABNORMAL LOW (ref 36.0–46.0)
Hemoglobin: 10.9 g/dL — ABNORMAL LOW (ref 12.0–15.0)
Immature Granulocytes: 1 %
Lymphocytes Relative: 5 %
Lymphs Abs: 0.5 10*3/uL — ABNORMAL LOW (ref 0.7–4.0)
MCH: 31.5 pg (ref 26.0–34.0)
MCHC: 32.4 g/dL (ref 30.0–36.0)
MCV: 97.1 fL (ref 80.0–100.0)
Monocytes Absolute: 0.5 10*3/uL (ref 0.1–1.0)
Monocytes Relative: 5 %
Neutro Abs: 8.3 10*3/uL — ABNORMAL HIGH (ref 1.7–7.7)
Neutrophils Relative %: 89 %
Platelets: 187 10*3/uL (ref 150–400)
RBC: 3.46 MIL/uL — ABNORMAL LOW (ref 3.87–5.11)
RDW: 13.3 % (ref 11.5–15.5)
WBC: 9.4 10*3/uL (ref 4.0–10.5)
nRBC: 0 % (ref 0.0–0.2)

## 2021-09-25 LAB — COMPREHENSIVE METABOLIC PANEL
ALT: 21 U/L (ref 0–44)
AST: 61 U/L — ABNORMAL HIGH (ref 15–41)
Albumin: 3.7 g/dL (ref 3.5–5.0)
Alkaline Phosphatase: 43 U/L (ref 38–126)
Anion gap: 11 (ref 5–15)
BUN: 14 mg/dL (ref 8–23)
CO2: 33 mmol/L — ABNORMAL HIGH (ref 22–32)
Calcium: 9.3 mg/dL (ref 8.9–10.3)
Chloride: 95 mmol/L — ABNORMAL LOW (ref 98–111)
Creatinine, Ser: 0.63 mg/dL (ref 0.44–1.00)
GFR, Estimated: >60 mL/min (ref >60)
Glucose, Bld: 114 mg/dL — ABNORMAL HIGH (ref 70–99)
Potassium: 3.4 mmol/L — ABNORMAL LOW (ref 3.5–5.1)
Sodium: 139 mmol/L (ref 135–145)
Total Bilirubin: 1.1 mg/dL (ref 0.3–1.2)
Total Protein: 7.3 g/dL (ref 6.5–8.1)

## 2021-09-25 LAB — URINALYSIS, ROUTINE W REFLEX MICROSCOPIC
Bilirubin Urine: NEGATIVE
Glucose, UA: NEGATIVE mg/dL
Ketones, ur: 80 mg/dL — AB
Leukocytes,Ua: NEGATIVE
Nitrite: NEGATIVE
Protein, ur: 100 mg/dL — AB
Specific Gravity, Urine: 1.026 (ref 1.005–1.030)
pH: 6 (ref 5.0–8.0)

## 2021-09-25 LAB — CK: Total CK: 895 U/L — ABNORMAL HIGH (ref 38–234)

## 2021-09-25 LAB — TROPONIN I (HIGH SENSITIVITY)
Troponin I (High Sensitivity): 47 ng/L — ABNORMAL HIGH (ref <18)
Troponin I (High Sensitivity): 58 ng/L — ABNORMAL HIGH (ref <18)

## 2021-09-25 LAB — ACETAMINOPHEN LEVEL: Acetaminophen (Tylenol), Serum: <10 ug/mL — ABNORMAL LOW (ref 10–30)

## 2021-09-25 LAB — CBG MONITORING, ED: Glucose-Capillary: 98 mg/dL (ref 70–99)

## 2021-09-25 LAB — SALICYLATE LEVEL: Salicylate Lvl: <7 mg/dL — ABNORMAL LOW (ref 7.0–30.0)

## 2021-09-25 LAB — ETHANOL: Alcohol, Ethyl (B): <10 mg/dL (ref <10)

## 2021-09-25 MED ORDER — ACETAMINOPHEN 650 MG RE SUPP: 650.0000 mg | Freq: Four times a day (QID) | RECTAL | Status: DC | PRN

## 2021-09-25 MED ORDER — ONDANSETRON HCL 4 MG PO TABS: 4.0000 mg | ORAL_TABLET | Freq: Four times a day (QID) | ORAL | Status: DC | PRN

## 2021-09-25 MED ORDER — ACETAMINOPHEN 325 MG PO TABS
650.0000 mg | ORAL_TABLET | Freq: Four times a day (QID) | ORAL | Status: DC | PRN
  Administered 2021-09-26: 650 mg via ORAL
  Filled 2021-09-25: qty 2

## 2021-09-25 MED ORDER — METOPROLOL TARTRATE 5 MG/5ML IV SOLN
5.0000 mg | Freq: Four times a day (QID) | INTRAVENOUS | Status: DC | PRN
Start: 2021-09-25 — End: 2021-09-27

## 2021-09-25 MED ORDER — SODIUM CHLORIDE 0.9% FLUSH
3.0000 mL | Freq: Two times a day (BID) | INTRAVENOUS | Status: DC
  Administered 2021-09-25 – 2021-09-27 (×4): 3 mL via INTRAVENOUS

## 2021-09-25 MED ORDER — ONDANSETRON HCL 4 MG/2ML IJ SOLN: 4.0000 mg | Freq: Four times a day (QID) | INTRAMUSCULAR | Status: DC | PRN

## 2021-09-25 MED ORDER — POTASSIUM CHLORIDE CRYS ER 20 MEQ PO TBCR
20.0000 meq | EXTENDED_RELEASE_TABLET | Freq: Once | ORAL | Status: AC
  Administered 2021-09-25: 20 meq via ORAL
  Filled 2021-09-25: qty 1

## 2021-09-25 NOTE — ED Notes (Signed)
ED TO INPATIENT HANDOFF REPORT ? ?ED Nurse Name and Phone #: Shirlee Limerick [phone] ? ?S ?Name/Age/Gender ?[name] ?88 y.o. ?female ?Room/Bed: 008C/008C ? ?Code Status ?  Code Status: Prior ? ?Home/SNF/Other ?Home ?Patient oriented to: self, place, time, and situation ?Is this baseline? Yes  ? ?Triage Complete: Triage complete  ?Chief Complaint ?Syncope [R55] ? ?Triage Note ?No notes on file  ? ?Allergies ?Allergies  ?Allergen Reactions  ? Doxycycline Other (See Comments)  ?  GI upset  ? Sulfadiazine Other (See Comments)  ?  REACTION: Fever, aches  ? Sulfa Antibiotics Rash  ? ? ?Level of Care/Admitting Diagnosis ?ED Disposition   ? ? ED Disposition  ?Admit  ? Condition  ?--  ? Comment  ?Hospital Area: [facility] [[id number]] ? Level of Care: Telemetry Cardiac [103] ? May place patient in observation at [facility] or [place] if equivalent level of care is available:: Yes ? Covid Evaluation: Recent COVID positive no isolation required infection day 21-90 ? Diagnosis: Syncope [206001] ? Admitting Physician: Patrecia Pour [[id number]] ? Attending Physician: Patrecia Pour [phone] ?  ?  ? ?  ? ? ?B ?Medical/Surgery History ?Past Medical History:  ?Diagnosis Date  ? Abdominal pain 04/05/2017  ? Allergic rhinitis, cause unspecified   ? Allergy, unspecified not elsewhere classified   ? Anemia, unspecified 04/10/2008  ? Qualifier: Diagnosis of  By: Linna Darner MD, Gwyndolyn Saxon    ? Anxiety   ? Anxiety state 03/01/2008  ? Qualifier: Diagnosis of  By: Council Mechanic MD, Hilaria Ota   ? Asthma   ? Breast cancer ([place]) 08/2018  ? right breast  ? Breast mass 08/28/2018  ? Chronic kidney disease   ? frequency  ? COMMON MIGRAINE 12/24/2006  ? Qualifier: Diagnosis of  By: Fuller Plan CMA (AAMA), Lugene    ? COPD (chronic obstructive pulmonary disease) ([place])   ? Degeneration of intervertebral disc, site unspecified   ? Diverticulosis of colon (without mention of hemorrhage)   ? DNR (do not resuscitate) 09/08/2019  ? Dysphagia 02/18/2019  ?  Encounter for chronic pain management 07/26/2016  ? Indication for chronic opioid: chronic back pain Medication and dose:  Oxycodone '10mg'$  TID # pills per month: 90 Last UDS date: 04/15/15 Pain contract signed (Y/N):  yes Date narcotic database last reviewed (include red flags):  05/06/17  ? Esophageal reflux   ? Fibromyalgia 12/24/2006  ? Qualifier: Diagnosis of  By: Fuller Plan CMA (AAMA), Lugene    ? Headache   ? HTN (hypertension) 06/09/2013  ? Hyperlipidemia with target LDL less than 70 12/24/2006  ? Qualifier: Diagnosis of  By: Fuller Plan CMA (AAMA), Lugene    ? Hypokalemia 05/24/2009  ? Qualifier: Diagnosis of  By: Lacretia Nicks    ? Hypothyroidism 12/24/2006  ? Qualifier: Diagnosis of  By: Fuller Plan CMA (AAMA), Lugene    ? Insomnia 12/24/2006  ? she had trials of antihistamines and TCAs prev w/o effect.     ? Insomnia, unspecified   ? Lower GI bleeding 09/09/2019  ? Myalgia and myositis, unspecified   ? Need for prophylactic hormone replacement therapy (postmenopausal)   ? Osteoarthritis of right knee 08/27/2011  ? R knee pain, s/p arthroscopy 2012 per [name]   ? Osteoporosis 06/23/2007  ? Qualifier: Diagnosis of  By: Council Mechanic MD, Hilaria Ota   ? Osteoporosis, unspecified   ? Other abnormal blood chemistry   ? Other and unspecified hyperlipidemia   ? Other chronic pain   ? Presence of drug coated  stent in right coronary artery 01/30/2019  ? Stroke [facility])   ? mini strokes  ? Syncope 01/24/2017  ? Thoracic aortic aneurysm ([place]) 04/10/2017  ? Unspecified essential hypertension   ? Unspecified hypothyroidism   ? Vitamin D deficiency 09/26/2016  ? ?Past Surgical History:  ?Procedure Laterality Date  ? BACK SURGERY    ? breast cystectomy    ? CORONARY/GRAFT ACUTE MI REVASCULARIZATION N/A 01/29/2019  ? Procedure: CORONARY/GRAFT ACUTE MI REVASCULARIZATION;  Surgeon: Belva Crome, MD;  Location: [place] [facility];  Service: Cardiovascular;  Laterality: N/A;  ? JOINT REPLACEMENT    ? shoulder right  ? KNEE ARTHROSCOPY    ? Right knee  2012  ? MASS EXCISION Left 04/07/2015  ? Procedure: MINOR EXCISION OF MASS LEFT SMALL FINGER;  Surgeon: Leanora Cover, MD;  Location: [place];  Service: Orthopedics;  Laterality: Left;  ? SHOULDER SURGERY    ? TOTAL KNEE ARTHROPLASTY  12/11/2011  ? Procedure: TOTAL KNEE ARTHROPLASTY;  Surgeon: Johnny Bridge, MD;  Location: [place];  Service: Orthopedics;  Laterality: Right;  ? UMBILICAL HERNIA REPAIR N/A 06/09/2013  ? Procedure: EXPLORATIORY LAPAROTOMY, HERNIA REPAIR UMBILICAL, INSERTION OF MESH;  Surgeon: Haywood Lasso, MD;  Location: [place];  Service: General;  Laterality: N/A;  ?  ? ?A ?IV Location/Drains/Wounds ?Patient Lines/Drains/Airways Status   ? ? Active Line/Drains/Airways   ? ? Name Placement date Placement time Site Days  ? Peripheral IV 09/25/21 18 G Left Forearm 09/25/21  1315  Forearm  less than 1  ? ?  ?  ? ?  ? ? ?Intake/Output Last 24 hours ?No intake or output data in the 24 hours ending 09/25/21 1716 ? ?Labs/Imaging ?Results for orders placed or performed during the hospital encounter of 09/25/21 (from the past 48 hour(s))  ?CBC with Differential     Status: Abnormal  ? Collection Time: 09/25/21  2:05 PM  ?Result Value Ref Range  ? WBC 9.4 4.0 - 10.5 K/uL  ? RBC 3.46 (L) 3.87 - 5.11 MIL/uL  ? Hemoglobin 10.9 (L) 12.0 - 15.0 g/dL  ? HCT 33.6 (L) 36.0 - 46.0 %  ? MCV 97.1 80.0 - 100.0 fL  ? MCH 31.5 26.0 - 34.0 pg  ? MCHC 32.4 30.0 - 36.0 g/dL  ? RDW 13.3 11.5 - 15.5 %  ? Platelets 187 150 - 400 K/uL  ? nRBC 0.0 0.0 - 0.2 %  ? Neutrophils Relative % 89 %  ? Neutro Abs 8.3 (H) 1.7 - 7.7 K/uL  ? Lymphocytes Relative 5 %  ? Lymphs Abs 0.5 (L) 0.7 - 4.0 K/uL  ? Monocytes Relative 5 %  ? Monocytes Absolute 0.5 0.1 - 1.0 K/uL  ? Eosinophils Relative 0 %  ? Eosinophils Absolute 0.0 0.0 - 0.5 K/uL  ? Basophils Relative 0 %  ? Basophils Absolute 0.0 0.0 - 0.1 K/uL  ? Immature Granulocytes 1 %  ? Abs Immature Granulocytes 0.06 0.00 - 0.07 K/uL  ?  Comment: Performed at [facility], [place] [address] [place], [place] [address]  ?Comprehensive metabolic panel     Status: Abnormal  ? Collection Time: 09/25/21  2:05 PM  ?Result Value Ref Range  ? Sodium 139 135 - 145 mmol/L  ? Potassium 3.4 (L) 3.5 - 5.1 mmol/L  ? Chloride 95 (L) 98 - 111 mmol/L  ? CO2 33 (H) 22 - 32 mmol/L  ? Glucose, Bld 114 (H) 70 - 99 mg/dL  ?  Comment: Glucose reference range applies only to samples taken after fasting for at least 8 hours.  ? BUN 14 8 - 23 mg/dL  ? Creatinine, Ser 0.63 0.44 - 1.00 mg/dL  ? Calcium 9.3 8.9 - 10.3 mg/dL  ? Total Protein 7.3 6.5 - 8.1 g/dL  ? Albumin 3.7 3.5 - 5.0 g/dL  ? AST 61 (H) 15 - 41 U/L  ? ALT 21 0 - 44 U/L  ? Alkaline Phosphatase 43 38 - 126 U/L  ? Total Bilirubin 1.1 0.3 - 1.2 mg/dL  ? GFR, Estimated >60 >60 mL/min  ?  Comment: (NOTE) ?Calculated using the CKD-EPI Creatinine Equation (2021) ?  ? Anion gap 11 5 - 15  ?  Comment: Performed at [facility], [place] [address] [place], [place] [address]  ?CK     Status: Abnormal  ? Collection Time: 09/25/21  2:05 PM  ?Result Value Ref Range  ? Total CK 895 (H) 38 - 234 U/L  ?  Comment: Performed at [facility], [place] [address], [place], [place] [address]  ?Acetaminophen level     Status: Abnormal  ? Collection Time: 09/25/21  2:05 PM  ?Result Value Ref Range  ? Acetaminophen (Tylenol), Serum <10 (L) 10 - 30 ug/mL  ?  Comment: (NOTE) ?Therapeutic concentrations vary significantly. A range of 10-30 ug/mL  ?may be an effective concentration for many patients. However, some  ?are best treated at concentrations outside of this range. ?Acetaminophen concentrations >150 ug/mL at 4 hours after ingestion  ?and >50 ug/mL at 12 hours after ingestion are often associated with  ?toxic reactions. ? ?Performed at [facility], [place] [address] [place], [place] ?[address] ?  ?Salicylate level     Status: Abnormal  ? Collection Time: 09/25/21  2:05 PM  ?Result Value Ref Range  ? Salicylate Lvl <7.3 (L) 7.0 - 30.0 mg/dL  ?  Comment: Performed  at [facility], [place] [address], [place], [place] [address]  ?Ethanol     Status: None  ? Collection Time: 09/25/21  2:05 PM  ?Result Value Ref Range  ? Alcohol, Ethyl (B) <10 <10 mg/dL  ?  Comment: (NOT

## 2021-09-25 NOTE — H&P (Signed)
?History and Physical  ? ? ?Patient: [name] NTZ:[id number] DOB: 1930-11-26 ?DOA: 09/25/2021 ?DOS: the patient was seen and examined on 09/25/2021 ?PCP: Tonia Ghent, MD  ?Patient coming from: Home ? ?Chief Complaint:  ?Chief Complaint  ?Patient presents with  ? Fall  ? ?HPI: [name] is an 88 y.o. female with a history of chronic hypoxic respiratory failure, COPD, CAD, HTN, HFrEF, CVA, hypothyroidism, covid-19 diagnosed 07/07/2021, and reports of cognitive decline who presented from home after being found down.  ? ?The patient has relative amnesia for all events surrounding this incident. She was last seen well 3/17 when her daughter dropped her off at home. The family had trouble getting in contact with her, so called for a welfare check this morning where she was found down, incontinent, not wearing her home O2, and confused. She reported pain all over and diffuse weakness, no focal weakness/numbness or pain. Unable to recall anything about these events to EDP, she states she thinks she was at her daughter's house "probably" and asks what the problem was with "the woman who was in here earlier with you." She also thinks she knows me but is reluctant to believe I am a doctor.  ? ?Work up in the ED was relatively reassuring. WBC normal, hgb 10.9, SCr 0.63, BUN 14, K 3.4, bicarb 33. Troponin 47, CK 895. ECG NSR. Radiographic evaluation of right knee, pelvis, cervical spine and head nonacute. Hospitalists contacted due to persistent amnesia for the event, unclear etiology of episode, and unsafe disposition home at this time.  ? ?Review of Systems: As mentioned in the history of present illness. All other systems reviewed and are negative. ?Past Medical History:  ?Diagnosis Date  ? Abdominal pain 04/05/2017  ? Allergic rhinitis, cause unspecified   ? Allergy, unspecified not elsewhere classified   ? Anemia, unspecified 04/10/2008  ? Qualifier: Diagnosis of  By: Linna Darner MD, Gwyndolyn Saxon    ? Anxiety    ? Anxiety state 03/01/2008  ? Qualifier: Diagnosis of  By: Council Mechanic MD, Hilaria Ota   ? Asthma   ? Breast cancer ([place]) 08/2018  ? right breast  ? Breast mass 08/28/2018  ? Chronic kidney disease   ? frequency  ? COMMON MIGRAINE 12/24/2006  ? Qualifier: Diagnosis of  By: Fuller Plan CMA (AAMA), Lugene    ? COPD (chronic obstructive pulmonary disease) ([place])   ? Degeneration of intervertebral disc, site unspecified   ? Diverticulosis of colon (without mention of hemorrhage)   ? DNR (do not resuscitate) 09/08/2019  ? Dysphagia 02/18/2019  ? Encounter for chronic pain management 07/26/2016  ? Indication for chronic opioid: chronic back pain Medication and dose:  Oxycodone '10mg'$  TID # pills per month: 90 Last UDS date: 04/15/15 Pain contract signed (Y/N):  yes Date narcotic database last reviewed (include red flags):  05/06/17  ? Esophageal reflux   ? Fibromyalgia 12/24/2006  ? Qualifier: Diagnosis of  By: Fuller Plan CMA (AAMA), Lugene    ? Headache   ? HTN (hypertension) 06/09/2013  ? Hyperlipidemia with target LDL less than 70 12/24/2006  ? Qualifier: Diagnosis of  By: Fuller Plan CMA (AAMA), Lugene    ? Hypokalemia 05/24/2009  ? Qualifier: Diagnosis of  By: Lacretia Nicks    ? Hypothyroidism 12/24/2006  ? Qualifier: Diagnosis of  By: Fuller Plan CMA (AAMA), Lugene    ? Insomnia 12/24/2006  ? she had trials of antihistamines and TCAs prev w/o effect.     ? Insomnia, unspecified   ? Lower  GI bleeding 09/09/2019  ? Myalgia and myositis, unspecified   ? Need for prophylactic hormone replacement therapy (postmenopausal)   ? Osteoarthritis of right knee 08/27/2011  ? R knee pain, s/p arthroscopy 2012 per [name]   ? Osteoporosis 06/23/2007  ? Qualifier: Diagnosis of  By: Council Mechanic MD, Hilaria Ota   ? Osteoporosis, unspecified   ? Other abnormal blood chemistry   ? Other and unspecified hyperlipidemia   ? Other chronic pain   ? Presence of drug coated stent in right coronary artery 01/30/2019  ? Stroke [facility])   ? mini strokes  ? Syncope 01/24/2017  ?  Thoracic aortic aneurysm ([place]) 04/10/2017  ? Unspecified essential hypertension   ? Unspecified hypothyroidism   ? Vitamin D deficiency 09/26/2016  ? ?Past Surgical History:  ?Procedure Laterality Date  ? BACK SURGERY    ? breast cystectomy    ? CORONARY/GRAFT ACUTE MI REVASCULARIZATION N/A 01/29/2019  ? Procedure: CORONARY/GRAFT ACUTE MI REVASCULARIZATION;  Surgeon: Belva Crome, MD;  Location: [place] [facility];  Service: Cardiovascular;  Laterality: N/A;  ? JOINT REPLACEMENT    ? shoulder right  ? KNEE ARTHROSCOPY    ? Right knee 2012  ? MASS EXCISION Left 04/07/2015  ? Procedure: MINOR EXCISION OF MASS LEFT SMALL FINGER;  Surgeon: Leanora Cover, MD;  Location: [place];  Service: Orthopedics;  Laterality: Left;  ? SHOULDER SURGERY    ? TOTAL KNEE ARTHROPLASTY  12/11/2011  ? Procedure: TOTAL KNEE ARTHROPLASTY;  Surgeon: Johnny Bridge, MD;  Location: [place];  Service: Orthopedics;  Laterality: Right;  ? UMBILICAL HERNIA REPAIR N/A 06/09/2013  ? Procedure: EXPLORATIORY LAPAROTOMY, HERNIA REPAIR UMBILICAL, INSERTION OF MESH;  Surgeon: Haywood Lasso, MD;  Location: [place];  Service: General;  Laterality: N/A;  ? ?Social History:  reports that she quit smoking about 36 years ago. Her smoking use included cigarettes. She has a 15.00 pack-year smoking history. She has never used smokeless tobacco. She reports that she does not drink alcohol and does not use drugs. ? ?Allergies  ?Allergen Reactions  ? Doxycycline Other (See Comments)  ?  GI upset  ? Sulfadiazine Other (See Comments)  ?  REACTION: Fever, aches  ? Sulfa Antibiotics Rash  ? ? ?Family History  ?Problem Relation Age of Onset  ? Lung cancer Brother   ?     x2  ? Hypertension Father   ? Heart disease Father   ? Heart disease Mother   ? Asthma Sister   ? ? ?Prior to Admission medications   ?Medication Sig Start Date Authorizing Provider  ?albuterol (PROAIR HFA) 108 (90 Base) MCG/ACT inhaler Inhale 1-2 puffs into the lungs every 6 (six) hours as  needed for wheezing or shortness of breath (Okay to fill with Ventolin or albuterol HFA). 10/30/19 Tonia Ghent, MD  ?alendronate (FOSAMAX) 70 MG tablet Take 70 mg by mouth once a week. 07/19/20 [provider]  ?ALPRAZolam Duanne Moron) 1 MG tablet TAKE 1/2 TABLET BY MOUTH THREE TIMES DAILY AS NEEDED FOR ANXIETY 09/24/21 Tonia Ghent, MD  ?anastrozole (ARIMIDEX) 1 MG tablet Take 1 tablet (1 mg total) by mouth daily. 10/05/20 Magrinat, Virgie Dad, MD  ?busPIRone (BUSPAR) 5 MG tablet TAKE ONE TABLET BY MOUTH IN THE MORNING AND IN THE EVENING 09/24/21 Tonia Ghent, MD  ?Calcium Carbonate (CALTRATE 600 PO) Take 600 mg by mouth daily.  [provider]  ?cholecalciferol (VITAMIN D) 1000 units tablet Take 1 tablet (1,000 Units total) by  mouth daily. 09/25/16 Tonia Ghent, MD  ?clopidogrel (PLAVIX) 75 MG tablet Take 1 tablet (75 mg total) by mouth daily. Please make overdue appt with Dr. Tamala Julian before anymore refills. Thank you 3rd and Final attempt ?Patient taking differently: Take 75 mg by mouth daily. 05/18/21 Belva Crome, MD  ?diclofenac Sodium ([employer] DICLOFENAC SODIUM) 1 % GEL Apply 2 g topically 4 (four) times daily as needed. ?Patient taking differently: Apply 2 g topically 4 (four) times daily as needed (for pain). 11/16/20 Tonia Ghent, MD  ?DULoxetine (CYMBALTA) 60 MG capsule TAKE ONE CAPSULE BY MOUTH DAILY ?Patient taking differently: Take 60 mg by mouth daily. 04/18/21 Tonia Ghent, MD  ?ferrous sulfate (FERROUSUL) 325 (65 FE) MG tablet Take 1 tablet (325 mg total) by mouth daily with breakfast. 08/22/18 Tonia Ghent, MD  ?fluticasone (FLONASE) 50 MCG/ACT nasal spray PLACE 2 SPRAYS INTO BOTH NOSTRILS DAILY. 01/28/20 Tonia Ghent, MD  ?levothyroxine (SYNTHROID) 112 MCG tablet TAKE ONE TABLET BY MOUTH DAILY ?Patient taking differently: Take 112 mcg by mouth daily before breakfast. 04/18/21 Tonia Ghent, MD  ?levothyroxine (SYNTHROID) 125 MCG tablet TAKE ONE TABLET BY MOUTH  DAILY ?Patient taking differently: Take 125 mcg by mouth daily before breakfast. 11/16/20 Tonia Ghent, MD  ?methocarbamol (ROBAXIN) 500 MG tablet Take 1 tablet (500 mg total) by mouth every 6 (six) hours

## 2021-09-25 NOTE — ED Provider Notes (Signed)
?[facility] ?Provider Note ? ? ?CSN: [id number] ?Arrival date & time: 09/25/21  1302 ? ?  ? ?History ? ?Chief Complaint  ?Patient presents with  ? Fall  ? ? ?[name] is a 88 y.o. female with a past medical history of vertigo, hypertension, COPD, CAD, prior stroke presenting to the ED with a chief complaint of fall.  States that she resides at home by herself ever since her husband passed away 4 years ago.  Last week she stayed with one of her daughters who lives in town but then returned home.  She is unsure why she is here in the hospital but states that "they told me that I passed out."  She states that she is having pain throughout her body, reports pain bilateral lower extremities, upper extremities and a "slight headache."  Denies any chest pain, vomiting, shortness of breath. ? ? ?Fall ?Associated symptoms include headaches. Pertinent negatives include no chest pain, no abdominal pain and no shortness of breath.  ? ?  ? ?Home Medications ?Prior to Admission medications   ?Medication Sig Start Date End Date Taking? Authorizing Provider  ?albuterol (PROAIR HFA) 108 (90 Base) MCG/ACT inhaler Inhale 1-2 puffs into the lungs every 6 (six) hours as needed for wheezing or shortness of breath (Okay to fill with Ventolin or albuterol HFA). 10/30/19   Tonia Ghent, MD  ?alendronate (FOSAMAX) 70 MG tablet Take 70 mg by mouth once a week. 07/19/20   [provider]  ?ALPRAZolam Duanne Moron) 1 MG tablet TAKE 1/2 TABLET BY MOUTH THREE TIMES DAILY AS NEEDED FOR ANXIETY 09/24/21   Tonia Ghent, MD  ?anastrozole (ARIMIDEX) 1 MG tablet Take 1 tablet (1 mg total) by mouth daily. 10/05/20   Magrinat, Virgie Dad, MD  ?busPIRone (BUSPAR) 5 MG tablet TAKE ONE TABLET BY MOUTH IN THE MORNING AND IN THE EVENING 09/24/21   Tonia Ghent, MD  ?Calcium Carbonate (CALTRATE 600 PO) Take 600 mg by mouth daily.    [provider]  ?cholecalciferol (VITAMIN D) 1000 units  tablet Take 1 tablet (1,000 Units total) by mouth daily. 09/25/16   Tonia Ghent, MD  ?clopidogrel (PLAVIX) 75 MG tablet Take 1 tablet (75 mg total) by mouth daily. Please make overdue appt with Dr. Tamala Julian before anymore refills. Thank you 3rd and Final attempt ?Patient taking differently: Take 75 mg by mouth daily. 05/18/21   Belva Crome, MD  ?diclofenac Sodium ([employer] DICLOFENAC SODIUM) 1 % GEL Apply 2 g topically 4 (four) times daily as needed. ?Patient taking differently: Apply 2 g topically 4 (four) times daily as needed (for pain). 11/16/20   Tonia Ghent, MD  ?DULoxetine (CYMBALTA) 60 MG capsule TAKE ONE CAPSULE BY MOUTH DAILY ?Patient taking differently: Take 60 mg by mouth daily. 04/18/21   Tonia Ghent, MD  ?ferrous sulfate (FERROUSUL) 325 (65 FE) MG tablet Take 1 tablet (325 mg total) by mouth daily with breakfast. 08/22/18   Tonia Ghent, MD  ?fluticasone (FLONASE) 50 MCG/ACT nasal spray PLACE 2 SPRAYS INTO BOTH NOSTRILS DAILY. 01/28/20   Tonia Ghent, MD  ?levothyroxine (SYNTHROID) 112 MCG tablet TAKE ONE TABLET BY MOUTH DAILY ?Patient taking differently: Take 112 mcg by mouth daily before breakfast. 04/18/21   Tonia Ghent, MD  ?levothyroxine (SYNTHROID) 125 MCG tablet TAKE ONE TABLET BY MOUTH DAILY ?Patient taking differently: Take 125 mcg by mouth daily before breakfast. 11/16/20   Tonia Ghent, MD  ?methocarbamol (  ROBAXIN) 500 MG tablet Take 1 tablet (500 mg total) by mouth every 6 (six) hours as needed for muscle spasms. Please contact clinic about follow up with palliative care 05/24/21   Tonia Ghent, MD  ?mirtazapine (REMERON) 7.5 MG tablet Take 1 tablet (7.5 mg total) by mouth at bedtime. 07/21/21   Tonia Ghent, MD  ?Multiple Vitamin (MULTIVITAMIN WITH MINERALS) TABS tablet Take 1 tablet by mouth daily.    [provider]  ?Multiple Vitamins-Minerals (OCUVITE PRESERVISION PO) Take 1 tablet by mouth daily. ?Patient not taking: Reported on 08/30/2021     [provider]  ?nitroGLYCERIN (NITROSTAT) 0.4 MG SL tablet Place 1 tablet (0.4 mg total) under the tongue every 5 (five) minutes as needed for chest pain. 02/23/20   Belva Crome, MD  ?oxybutynin (DITROPAN) 5 MG tablet Take 1 tablet (5 mg total) by mouth at bedtime as needed for bladder spasms. 03/20/21   Tonia Ghent, MD  ?OXYGEN Inhale 3-3.5 L into the lungs continuous.    [provider]  ?pantoprazole (PROTONIX) 20 MG tablet TAKE ONE TABLET BY MOUTH TWICE DAILY ?Patient taking differently: Take 20 mg by mouth daily. 03/26/21   Tonia Ghent, MD  ?rosuvastatin (CRESTOR) 10 MG tablet TAKE ONE TABLET BY MOUTH DAILY ?Patient taking differently: Take 10 mg by mouth daily. 03/26/21   Tonia Ghent, MD  ?tiotropium (SPIRIVA HANDIHALER) 18 MCG inhalation capsule Place 1 capsule (18 mcg total) into inhaler and inhale daily. 09/24/21   Tonia Ghent, MD  ?bisoprolol (ZEBETA) 5 MG tablet Take 0.5 tablets (2.5 mg total) by mouth daily. 01/31/19 09/18/19  Belva Crome, MD  ?   ? ?Allergies    ?Doxycycline, Sulfadiazine, and Sulfa antibiotics   ? ?Review of Systems   ?Review of Systems  ?Constitutional:  Negative for appetite change, chills and fever.  ?HENT:  Negative for ear pain, rhinorrhea, sneezing and sore throat.   ?Eyes:  Negative for photophobia and visual disturbance.  ?Respiratory:  Negative for cough, chest tightness, shortness of breath and wheezing.   ?Cardiovascular:  Negative for chest pain and palpitations.  ?Gastrointestinal:  Negative for abdominal pain, blood in stool, constipation, diarrhea, nausea and vomiting.  ?Genitourinary:  Negative for dysuria, hematuria and urgency.  ?Musculoskeletal:  Positive for myalgias.  ?Skin:  Negative for rash.  ?Neurological:  Positive for headaches. Negative for dizziness, weakness and light-headedness.  ? ?Physical Exam ?Updated Vital Signs ?BP 97/83   Pulse 69   Temp 97.6 ?F (36.4 ?C)   Resp 19   Ht 5' (1.524 m)   Wt 61.2 kg   SpO2  95%   BMI 26.35 kg/m?  ?Physical Exam ?Vitals and nursing note reviewed.  ?Constitutional:   ?   General: She is not in acute distress. ?   Appearance: She is well-developed.  ?HENT:  ?   Head: Normocephalic and atraumatic.  ?   Nose: Nose normal.  ?Eyes:  ?   General: No scleral icterus.    ?   Left eye: No discharge.  ?   Conjunctiva/sclera: Conjunctivae normal.  ?Cardiovascular:  ?   Rate and Rhythm: Normal rate and regular rhythm.  ?   Heart sounds: Normal heart sounds. No murmur heard. ?  No friction rub. No gallop.  ?Pulmonary:  ?   Effort: Pulmonary effort is normal. No respiratory distress.  ?   Breath sounds: Normal breath sounds.  ?Abdominal:  ?   General: Bowel sounds are normal. There is  no distension.  ?   Palpations: Abdomen is soft.  ?   Tenderness: There is no abdominal tenderness. There is no guarding.  ?Musculoskeletal:     ?   General: Normal range of motion.  ?   Cervical back: Normal range of motion and neck supple.  ?   Comments: Tenderness palpation of the cervical spine at the midline and paraspinal musculature.  No overlying skin changes.  No tenderness to palpation of the thoracic or lumbar spine at the midline.  No deformities of bilateral shoulders.  ?Skin: ?   General: Skin is warm and dry.  ?   Findings: No rash.  ?Neurological:  ?   Mental Status: She is alert.  ?   Motor: No abnormal muscle tone.  ?   Coordination: Coordination normal.  ?   Comments: Alert, oriented to self, place, time and situation.  No facial asymmetry. PERRL.  Strength 5/5 in bilateral upper extremities.  2+ DP pulse palpated bilaterally.  No aphasia.  ? ? ?ED Results / Procedures / Treatments   ?Labs ?(all labs ordered are listed, but only abnormal results are displayed) ?Labs Reviewed  ?CBC WITH DIFFERENTIAL/PLATELET - Abnormal; Notable for the following components:  ?    Result Value  ? RBC 3.46 (*)   ? Hemoglobin 10.9 (*)   ? HCT 33.6 (*)   ? Neutro Abs 8.3 (*)   ? Lymphs Abs 0.5 (*)   ? All other components  within normal limits  ?COMPREHENSIVE METABOLIC PANEL - Abnormal; Notable for the following components:  ? Potassium 3.4 (*)   ? Chloride 95 (*)   ? CO2 33 (*)   ? Glucose, Bld 114 (*)   ? AST 61 (*)   ? All other comp

## 2021-09-25 NOTE — ED Triage Notes (Incomplete)
Pt from home after fall-daughter has not been able to get in touch with pt for two days. Family dropped off food yesterday for patient and food was still on doorstep today. Pt was found by PD on the floor in the bathroom but does not remember how she got there. Hx dementia. C/o R hip, posterior head, and BLE. No obvious injuries and no blood thinners.  ?

## 2021-09-25 NOTE — Telephone Encounter (Signed)
Attempted to call patient regarding a refill request we received. Unable to leave a message. Will attempt to contact patient at another time.  ?

## 2021-09-26 ENCOUNTER — Telehealth: Payer: Self-pay | Admitting: Family Medicine

## 2021-09-26 DIAGNOSIS — C50411 Malignant neoplasm of upper-outer quadrant of right female breast: Secondary | ICD-10-CM

## 2021-09-26 DIAGNOSIS — Z66 Do not resuscitate: Secondary | ICD-10-CM

## 2021-09-26 DIAGNOSIS — E039 Hypothyroidism, unspecified: Secondary | ICD-10-CM

## 2021-09-26 DIAGNOSIS — W19XXXA Unspecified fall, initial encounter: Secondary | ICD-10-CM | POA: Diagnosis not present

## 2021-09-26 DIAGNOSIS — D649 Anemia, unspecified: Secondary | ICD-10-CM

## 2021-09-26 DIAGNOSIS — Z8673 Personal history of transient ischemic attack (TIA), and cerebral infarction without residual deficits: Secondary | ICD-10-CM

## 2021-09-26 DIAGNOSIS — R531 Weakness: Secondary | ICD-10-CM

## 2021-09-26 DIAGNOSIS — I25110 Atherosclerotic heart disease of native coronary artery with unstable angina pectoris: Secondary | ICD-10-CM

## 2021-09-26 DIAGNOSIS — H9190 Unspecified hearing loss, unspecified ear: Secondary | ICD-10-CM

## 2021-09-26 DIAGNOSIS — J449 Chronic obstructive pulmonary disease, unspecified: Secondary | ICD-10-CM

## 2021-09-26 DIAGNOSIS — J9611 Chronic respiratory failure with hypoxia: Secondary | ICD-10-CM

## 2021-09-26 DIAGNOSIS — F411 Generalized anxiety disorder: Secondary | ICD-10-CM

## 2021-09-26 DIAGNOSIS — K219 Gastro-esophageal reflux disease without esophagitis: Secondary | ICD-10-CM

## 2021-09-26 DIAGNOSIS — I10 Essential (primary) hypertension: Secondary | ICD-10-CM

## 2021-09-26 DIAGNOSIS — Z170 Estrogen receptor positive status [ER+]: Secondary | ICD-10-CM

## 2021-09-26 DIAGNOSIS — E876 Hypokalemia: Secondary | ICD-10-CM

## 2021-09-26 DIAGNOSIS — R42 Dizziness and giddiness: Secondary | ICD-10-CM

## 2021-09-26 DIAGNOSIS — Y92009 Unspecified place in unspecified non-institutional (private) residence as the place of occurrence of the external cause: Secondary | ICD-10-CM

## 2021-09-26 LAB — URINE CULTURE

## 2021-09-26 MED ORDER — ALBUTEROL SULFATE (2.5 MG/3ML) 0.083% IN NEBU: 3.0000 mL | INHALATION_SOLUTION | Freq: Four times a day (QID) | RESPIRATORY_TRACT | Status: DC | PRN

## 2021-09-26 MED ORDER — FLUTICASONE PROPIONATE 50 MCG/ACT NA SUSP: 2.0000 | Freq: Every day | NASAL | Status: DC | PRN

## 2021-09-26 MED ORDER — TRAZODONE HCL 100 MG PO TABS
100.0000 mg | ORAL_TABLET | Freq: Every day | ORAL | Status: DC
  Administered 2021-09-26 (×2): 100 mg via ORAL
  Filled 2021-09-26 (×2): qty 1

## 2021-09-26 MED ORDER — BUSPIRONE HCL 5 MG PO TABS
5.0000 mg | ORAL_TABLET | Freq: Two times a day (BID) | ORAL | Status: DC
  Administered 2021-09-26 – 2021-09-27 (×2): 5 mg via ORAL
  Filled 2021-09-26 (×2): qty 1

## 2021-09-26 MED ORDER — CLOPIDOGREL BISULFATE 75 MG PO TABS
75.0000 mg | ORAL_TABLET | Freq: Every day | ORAL | Status: DC
  Administered 2021-09-26 – 2021-09-27 (×2): 75 mg via ORAL
  Filled 2021-09-26 (×2): qty 1

## 2021-09-26 MED ORDER — OXYBUTYNIN CHLORIDE 5 MG PO TABS: 5.0000 mg | ORAL_TABLET | Freq: Every evening | ORAL | Status: DC | PRN

## 2021-09-26 MED ORDER — UMECLIDINIUM BROMIDE 62.5 MCG/ACT IN AEPB
1.0000 | INHALATION_SPRAY | Freq: Every day | RESPIRATORY_TRACT | Status: DC
  Filled 2021-09-26: qty 7

## 2021-09-26 MED ORDER — ADULT MULTIVITAMIN W/MINERALS CH
1.0000 | ORAL_TABLET | Freq: Every day | ORAL | Status: DC
  Administered 2021-09-27: 1 via ORAL
  Filled 2021-09-26: qty 1

## 2021-09-26 MED ORDER — METHOCARBAMOL 500 MG PO TABS
500.0000 mg | ORAL_TABLET | Freq: Four times a day (QID) | ORAL | Status: DC | PRN
  Administered 2021-09-27: 500 mg via ORAL
  Filled 2021-09-26: qty 1

## 2021-09-26 MED ORDER — FERROUS SULFATE 325 (65 FE) MG PO TABS
325.0000 mg | ORAL_TABLET | Freq: Every day | ORAL | Status: DC
  Administered 2021-09-27: 325 mg via ORAL
  Filled 2021-09-26: qty 1

## 2021-09-26 MED ORDER — ANASTROZOLE 1 MG PO TABS
1.0000 mg | ORAL_TABLET | Freq: Every day | ORAL | Status: DC
Start: 2021-09-26 — End: 2021-09-27
  Administered 2021-09-26 – 2021-09-27 (×2): 1 mg via ORAL
  Filled 2021-09-26 (×2): qty 1

## 2021-09-26 MED ORDER — CALCIUM CARBONATE 1250 (500 CA) MG PO TABS
1.0000 | ORAL_TABLET | Freq: Every day | ORAL | Status: DC
  Administered 2021-09-27: 500 mg via ORAL
  Filled 2021-09-26: qty 1

## 2021-09-26 MED ORDER — DULOXETINE HCL 60 MG PO CPEP
60.0000 mg | ORAL_CAPSULE | Freq: Every day | ORAL | Status: DC
  Administered 2021-09-26 – 2021-09-27 (×2): 60 mg via ORAL
  Filled 2021-09-26 (×2): qty 1

## 2021-09-26 MED ORDER — ENSURE ENLIVE PO LIQD
237.0000 mL | Freq: Three times a day (TID) | ORAL | Status: DC
  Administered 2021-09-26 – 2021-09-27 (×2): 237 mL via ORAL

## 2021-09-26 MED ORDER — LEVOTHYROXINE SODIUM 25 MCG PO TABS: 125.0000 ug | ORAL_TABLET | Freq: Every day | ORAL | Status: DC

## 2021-09-26 MED ORDER — NITROGLYCERIN 0.4 MG SL SUBL: 0.4000 mg | SUBLINGUAL_TABLET | SUBLINGUAL | Status: DC | PRN

## 2021-09-26 MED ORDER — VITAMIN D 25 MCG (1000 UNIT) PO TABS
1000.0000 [IU] | ORAL_TABLET | Freq: Every day | ORAL | Status: DC
  Administered 2021-09-27: 1000 [IU] via ORAL
  Filled 2021-09-26: qty 1

## 2021-09-26 MED ORDER — ALPRAZOLAM 0.5 MG PO TABS: 0.5000 mg | ORAL_TABLET | Freq: Three times a day (TID) | ORAL | Status: DC | PRN

## 2021-09-26 MED ORDER — FOSFOMYCIN TROMETHAMINE 3 G PO PACK
3.0000 g | PACK | Freq: Once | ORAL | Status: AC
  Administered 2021-09-26: 3 g via ORAL
  Filled 2021-09-26: qty 3

## 2021-09-26 MED ORDER — DICLOFENAC SODIUM 1 % EX GEL: 2.0000 g | Freq: Four times a day (QID) | CUTANEOUS | Status: DC | PRN

## 2021-09-26 MED ORDER — ROSUVASTATIN CALCIUM 5 MG PO TABS
10.0000 mg | ORAL_TABLET | Freq: Every day | ORAL | Status: DC
  Administered 2021-09-26 – 2021-09-27 (×2): 10 mg via ORAL
  Filled 2021-09-26 (×2): qty 2

## 2021-09-26 MED ORDER — PANTOPRAZOLE SODIUM 20 MG PO TBEC
20.0000 mg | DELAYED_RELEASE_TABLET | Freq: Two times a day (BID) | ORAL | Status: DC
  Administered 2021-09-26 – 2021-09-27 (×2): 20 mg via ORAL
  Filled 2021-09-26 (×2): qty 1

## 2021-09-26 MED ORDER — TIOTROPIUM BROMIDE MONOHYDRATE 18 MCG IN CAPS: 18.0000 ug | ORAL_CAPSULE | Freq: Every day | RESPIRATORY_TRACT | Status: DC

## 2021-09-26 NOTE — Progress Notes (Signed)
Initial Nutrition Assessment ? ?DOCUMENTATION CODES:  ?Not applicable ? ?INTERVENTION:  ?Continue regular diet, encourage PO intake ?Ensure Enlive po TID, each supplement provides 350 kcal and 20 grams of protein. ?MVI with minerals daily ?Magic cup TID with meals, each supplement provides 290 kcal and 9 grams of protein ? ?NUTRITION DIAGNOSIS:  ?Increased nutrient needs related to chronic illness (COPD, CHF) as evidenced by estimated needs. ? ?GOAL:  ?Patient will meet greater than or equal to 90% of their needs ? ?MONITOR:  ?PO intake, Supplement acceptance, Labs, Weight trends ? ?REASON FOR ASSESSMENT:  ?Consult ?Assessment of nutrition requirement/status, Poor PO ? ?ASSESSMENT:  ?88 year old female with hx of HTN, HLD, GERD, diverticulosis, hx of stroke, COPD, CHF, CAD, CKD, hx breast cancer, osteoporosis, vitamin d deficiency presented to ED after being found down at home. ? ?Pt resting in bed at the time of assessment, NT and student RN in room obtaining vitals.  ? ?Pt alert and interactive, but pleasantly confused. Answers questions, but with random answers and short attention span. Unable to obtain a reliable nutrition hx at this time.   ? ?Pt does report that she likes ensure. Agreeable to receive between meals.  ? ?Muscle and fat deficits present on exam, but weight hx is stable and intake so far is reassuring, could be due to normal aging versus malnutrition. Will continue to monitor. ? ?Average Meal Intake: ?3/21: 95% intake x 1 recorded meals ? ?Nutritionally Relevant Medications: ?PRN Meds: ondansetron ? ?Labs Reviewed: ?Potassium 3.4  ? ?NUTRITION - FOCUSED PHYSICAL EXAM: ?No weight loss noted, could be due to normal aging with advanced age. WC bound at baseline ?Flowsheet Row Most Recent Value  ?Orbital Region No depletion  ?Upper Arm Region Mild depletion  ?Thoracic and Lumbar Region Mild depletion  ?Buccal Region No depletion  ?Temple Region Mild depletion  ?Clavicle Bone Region Mild depletion   ?Clavicle and Acromion Bone Region Mild depletion  ?Scapular Bone Region No depletion  ?Dorsal Hand Mild depletion  ?Patellar Region Mild depletion  ?Anterior Thigh Region Mild depletion  ?Posterior Calf Region Mild depletion  ?Edema (RD Assessment) None  ?Hair Reviewed  ?Eyes Reviewed  ?Mouth Reviewed  ?Skin Reviewed  ?Nails Reviewed  ? ?Diet Order:   ?Diet Order   ? ?       ?  Diet regular Room service appropriate? Yes; Fluid consistency: Thin  Diet effective now       ?  ? ?  ?  ? ?  ? ? ?EDUCATION NEEDS:  ?Not appropriate for education at this time ? ?Skin:  Skin Assessment: Reviewed RN Assessment ? ?Last BM:  unsure ? ?Height:  ?Ht Readings from Last 1 Encounters:  ?09/25/21 5' (1.524 m)  ? ? ?Weight:  ?Wt Readings from Last 1 Encounters:  ?09/25/21 61.2 kg  ? ? ?Ideal Body Weight:  45.5 kg ? ?BMI:  Body mass index is 26.35 kg/m?. ? ?Estimated Nutritional Needs:  ?Kcal:  1400-1600 kcal/d ?Protein:  70-80 g/d ?Fluid:  1.5-1.8 L/d ? ? ?Ranell Patrick, RD, LDN ?Clinical Dietitian ?RD pager # available in [place]  ?After hours/weekend pager # available in [place] ?

## 2021-09-26 NOTE — TOC Progression Note (Signed)
Transition of Care (TOC) - Progression Note  ? ? ?Patient Details  ?Name: [name] ?MRN: [id number] ?Date of Birth: October 30, 1930 ? ?Transition of Care (TOC) CM/SW Contact  ?Coralee Pesa, LCSWA ?Phone Number: ?09/26/2021, 3:03 PM ? ?Clinical Narrative:    ? ?CSW spoke with pt's dtr/ POA, Tina. Bed offers were provided as well as medicare. Gov information. [name] was notified that [facility] has declined and [facility] are pending for clinical improvement. TOC will continue to follow for DC needs. ? ?Expected Discharge Plan: [place] ?Barriers to Discharge: Continued Medical Work up ? ?Expected Discharge Plan and Services ?Expected Discharge Plan: [place] ?In-house Referral: Clinical Social Work ?  ?  ?Living arrangements for the past 2 months: [place] ?                ?  ?  ?  ?  ?  ?  ?  ?  ?  ?  ? ? ?Social Determinants of Health (SDOH) Interventions ?  ? ?Readmission Risk Interventions ?No flowsheet data found. ? ?

## 2021-09-26 NOTE — Evaluation (Signed)
Occupational Therapy Evaluation Patient Details Name: [name] MRN: [id number] DOB: 01-29-1931 Today's Date: 09/26/2021   History of Present Illness 88 yo admitted 3/20 after found down at home with fall vs syncope and potentially down 2 days. Imaging negative. PMhx:chronic hypoxic respiratory failure, COPD, CAD, HTN, HFrEF, CVA, hypothyroidism, cognitive decline, breast CA, CKD, fibromyalgia   Clinical Impression   Pt independent at baseline with ADLs, uses Rollator for mobility per daughter.Pt hyperverbose during session, tangential, difficulty following single step commands, and is preoccupied by lines/leads. A & O x4. Pt lives with daughter who states she stays with pt between 3-7 days/week, however is not sure she can provide increased assistance at home. Currently, pt min-mod A for ADLs and bed mobility, and min A for transfers with RW. Benefits from heavy verbal and tactile cuing. Pt with posterior lean in standing, however able to take 3-4 steps forward/back from EOB before fatiguing. VSS on 4L O2 throughout session. Pt presenting with impairments listed below, will follow acutely. Recommend SNF at d/c.      Recommendations for follow up therapy are one component of a multi-disciplinary discharge planning process, led by the attending physician.  Recommendations may be updated based on patient status, additional functional criteria and insurance authorization.   Follow Up Recommendations  Skilled nursing-short term rehab (<3 hours/day)    Assistance Recommended at Discharge Intermittent Supervision/Assistance  Patient can return home with the following A lot of help with walking and/or transfers;A lot of help with bathing/dressing/bathroom;Assistance with cooking/housework;Assist for transportation;Help with stairs or ramp for entrance;Direct supervision/assist for medications management;Direct supervision/assist for financial management    Functional Status Assessment   Patient has had a recent decline in their functional status and demonstrates the ability to make significant improvements in function in a reasonable and predictable amount of time.  Equipment Recommendations  None recommended by OT;Other (comment) (defer to next venue of care)    Recommendations for Other Services       Precautions / Restrictions Precautions Precautions: Fall;Other (comment) Precaution Comments: watch sats, posterior bias Restrictions Weight Bearing Restrictions: No      Mobility Bed Mobility Overal bed mobility: Needs Assistance Bed Mobility: Supine to Sit, Sit to Supine     Supine to sit: Min assist Sit to supine: Mod assist   General bed mobility comments: mod A for assisting LE's into bed/scooting pt up in bed    Transfers Overall transfer level: Needs assistance   Transfers: Sit to/from Stand Sit to Stand: Min assist           General transfer comment: pt needing cues for hand placement, posterior bias with standing, tactile cues to stand upright and take 3-4 steps      Balance Overall balance assessment: Needs assistance   Sitting balance-Leahy Scale: Fair Sitting balance - Comments: sits EOB without support, able to reach down to pullup socks   Standing balance support: Bilateral upper extremity supported Standing balance-Leahy Scale: Poor Standing balance comment: reliant on RW                           ADL either performed or assessed with clinical judgement   ADL Overall ADL's : Needs assistance/impaired Eating/Feeding: Set up;Sitting   Grooming: Set up;Sitting   Upper Body Bathing: Minimal assistance;Sitting   Lower Body Bathing: Moderate assistance;Sitting/lateral leans   Upper Body Dressing : Minimal assistance;Sitting   Lower Body Dressing: Moderate assistance;Sitting/lateral leans Lower Body Dressing Details (indicate cue  type and reason): don socks Toilet Transfer: Minimal assistance;Moderate  assistance;BSC/3in1;Stand-pivot;Rolling walker (2 wheels) Toilet Transfer Details (indicate cue type and reason): simulated in room Toileting- Clothing Manipulation and Hygiene: Maximal assistance;Sitting/lateral lean       Functional mobility during ADLs: Minimal assistance;Rolling walker (2 wheels);Moderate assistance       Vision   Vision Assessment?: No apparent visual deficits     Perception     Praxis      Pertinent Vitals/Pain Pain Assessment Pain Assessment: No/denies pain     Hand Dominance Right   Extremity/Trunk Assessment Upper Extremity Assessment Upper Extremity Assessment: Generalized weakness   Lower Extremity Assessment Lower Extremity Assessment: Defer to PT evaluation   Cervical / Trunk Assessment Cervical / Trunk Assessment: Normal   Communication Communication Communication: HOH   Cognition Arousal/Alertness: Awake/alert Behavior During Therapy: Restless Overall Cognitive Status: Impaired/Different from baseline Area of Impairment: Memory, Attention, Following commands, Safety/judgement, Problem solving, Awareness                   Current Attention Level: Sustained Memory: Decreased short-term memory Following Commands: Follows one step commands inconsistently Safety/Judgement: Decreased awareness of deficits, Decreased awareness of safety Awareness: Intellectual Problem Solving: Requires verbal cues, Requires tactile cues General Comments: pt oriented to person, place, time with pt verbose and tangential. Pt hallucinating and seeing a team of doctors present at foot of bed/in room     General Comments  daughter present in room throughout session, SpO2 stable throughout session, though poor wave form at times. VSS on 4L O2    Exercises     Shoulder Instructions      Home Living Family/patient expects to be discharged to:: Private residence Living Arrangements: Alone Available Help at Discharge: Family;Available  PRN/intermittently Type of Home: House Home Access: Ramped entrance     Home Layout: One level     Bathroom Shower/Tub: Producer, television/film/video: Handicapped height Bathroom Accessibility: Yes   Home Equipment: Rollator (4 wheels);BSC/3in1;Shower seat;Wheelchair - manual;Grab bars - toilet;Grab bars - tub/shower   Additional Comments: daughter stays with pt 3-7 nights per week      Prior Functioning/Environment Prior Level of Function : Needs assist             Mobility Comments: walks with rollator ADLs Comments: doesn't drive, family does the cooking and cleaning, washes up at sink per family        OT Problem List: Decreased strength;Decreased activity tolerance;Decreased range of motion;Impaired balance (sitting and/or standing);Decreased cognition;Decreased safety awareness;Decreased knowledge of use of DME or AE;Cardiopulmonary status limiting activity      OT Treatment/Interventions: Self-care/ADL training;Therapeutic exercise;Neuromuscular education;DME and/or AE instruction;Therapeutic activities;Patient/family education;Balance training    OT Goals(Current goals can be found in the care plan section) Acute Rehab OT Goals Patient Stated Goal: none stated OT Goal Formulation: With patient Time For Goal Achievement: 10/10/21 Potential to Achieve Goals: Good ADL Goals Pt Will Perform Upper Body Dressing: with supervision;sitting Pt Will Perform Lower Body Dressing: with min assist;sitting/lateral leans;sit to/from stand Pt Will Transfer to Toilet: with min guard assist;stand pivot transfer;bedside commode Pt Will Perform Tub/Shower Transfer: Shower transfer;Tub transfer;with min assist;shower seat;ambulating;rolling walker  OT Frequency: Min 2X/week    Co-evaluation              AM-PAC OT "6 Clicks" Daily Activity     Outcome Measure Help from another person eating meals?: A Little Help from another person taking care of personal grooming?: A  Little Help  from another person toileting, which includes using toliet, bedpan, or urinal?: A Lot Help from another person bathing (including washing, rinsing, drying)?: A Lot Help from another person to put on and taking off regular upper body clothing?: A Lot Help from another person to put on and taking off regular lower body clothing?: A Lot 6 Click Score: 14   End of Session Equipment Utilized During Treatment: Gait belt;Rolling walker (2 wheels);Oxygen Nurse Communication: Mobility status  Activity Tolerance: Patient tolerated treatment well;Patient limited by fatigue Patient left: in bed;with call bell/phone within reach;with bed alarm set;with family/visitor present  OT Visit Diagnosis: Unsteadiness on feet (R26.81);Other abnormalities of gait and mobility (R26.89);Muscle weakness (generalized) (M62.81);History of falling (Z91.81)                Time: 8295-6213 OT Time Calculation (min): 27 min Charges:  OT General Charges $OT Visit: 1 Visit OT Evaluation $OT Eval Moderate Complexity: 1 Mod OT Treatments $Self Care/Home Management : 8-22 mins  [name], OTD, OTR/L Acute Rehab [phone]   Mayer Masker 09/26/2021, 11:58 AM

## 2021-09-26 NOTE — TOC Initial Note (Addendum)
Transition of Care (TOC) - Initial/Assessment Note  ? ? ?Patient Details  ?Name: [name] ?MRN: [id number] ?Date of Birth: 05/18/1931 ? ?Transition of Care (TOC) CM/SW Contact:    ?Milas Gain, LCSWA ?Phone Number: ?09/26/2021, 11:10 AM ? ?Clinical Narrative:                 ? ? ?Update- CSW started insurance authorization for patient. Reference number # [id number]. ?CSW received consult for possible SNF placement at time of discharge. CSW spoke with patients POA daughter [name] regarding PT recommendation of SNF placement at time of discharge. Patients daughter expressed understanding of PT recommendation and is agreeable to SNF placement for patient at time of discharge. Patients daughter reports preference for [facility] or [facility] .Patients daughter gave CSW permission to fax out initial referral near the [place] area. CSW discussed insurance authorization process and will provide Medicare SNF ratings list with accepted SNF bed offers when available. Patients daughter reports patient  has received the COVID vaccines.  No further questions reported at this time. CSW to continue to follow and assist with discharge planning needs.  ? ?Expected Discharge Plan: [place] ?Barriers to Discharge: Continued Medical Work up ? ? ?Patient Goals and CMS Choice ?Patient states their goals for this hospitalization and ongoing recovery are:: SNF ?CMS Medicare.gov Compare Post Acute Care list provided to:: Patient Represenative (must comment) (patients daughter [name] patients POA) ?Choice offered to / list presented to : Adult Children (patients daughter [name]) ? ?Expected Discharge Plan and Services ?Expected Discharge Plan: [place] ?In-house Referral: Clinical Social Work ?  ?  ?Living arrangements for the past 2 months: [place] ?                ?  ?  ?  ?  ?  ?  ?  ?  ?  ?  ? ?Prior Living Arrangements/Services ?Living arrangements for the past 2  months: [place] ?Lives with:: Self ?Patient language and need for interpreter reviewed:: Yes ?Do you feel safe going back to the place where you live?: No   SNF  ?Need for Family Participation in Patient Care: Yes (Comment) ?Care giver support system in place?: Yes (comment) ?  ?Criminal Activity/Legal Involvement Pertinent to Current Situation/Hospitalization: No - Comment as needed ? ?Activities of Daily Living ?  ?  ? ?Permission Sought/Granted ?Permission sought to share information with : Case Manager, Family Supports, Customer service manager ?Permission granted to share information with : No ? Share Information with NAME: Patient currently only oriented to person spoke with patients POA tina ? Permission granted to share info w AGENCY: Patient currently only oriented to person spoke with patients POA tina/SNF ? Permission granted to share info w Relationship: Patient currently only oriented to person spoke with patients POA tina ? Permission granted to share info w Contact Information: Patient currently only oriented to person spoke with patients [place] [phone] ? ?Emotional Assessment ?Appearance:: Appears stated age ?Attitude/Demeanor/Rapport: Gracious ?Affect (typically observed): Calm ?Orientation: : Oriented to Self ?Alcohol / Substance Use: Not Applicable ?Psych Involvement: No (comment) ? ?Admission diagnosis:  Syncope [R55] ?Elevated troponin [R77.8] ?Elevated CK [R74.8] ?Syncope, unspecified syncope type [R55] ?Patient Active Problem List  ? Diagnosis Date Noted  ? Syncope 09/25/2021  ? Generalized weakness 07/07/2020  ? Decreased appetite 09/20/2019  ? Lower GI bleeding 09/09/2019  ? DNR (do not resuscitate) 09/08/2019  ? Anemia 09/07/2019  ? Macrocytic anemia 09/07/2019  ?  Jerking 09/07/2019  ? Fall at home, initial encounter 09/07/2019  ? History of CVA (cerebrovascular accident) 09/07/2019  ? History of breast cancer 09/07/2019  ? Pelvic pain 03/25/2019  ? Dysphagia  02/18/2019  ? Coronary artery disease involving native coronary artery of native heart with unstable angina pectoris ([place]) 01/30/2019  ? Presence of drug coated stent in right coronary artery 01/30/2019  ? Left main coronary artery disease - ~50-60% dLM (&70% dLAD) -- Plan Med Rx 01/30/2019  ? Ischemic cardiomyopathy 01/30/2019  ? Acute ST elevation myocardial infarction (STEMI) of inferior wall ([place]) 01/29/2019  ? AAA (abdominal aortic aneurysm) 12/31/2018  ? Malignant neoplasm of upper-outer quadrant of right breast in female, estrogen receptor positive ([place]) 09/26/2018  ? Breast mass 08/28/2018  ? Bilateral impacted cerumen 08/14/2017  ? Laryngopharyngeal reflux (LPR) 08/14/2017  ? Presbycusis of both ears 08/14/2017  ? Thoracic aortic aneurysm 04/10/2017  ? Abdominal pain 04/05/2017  ? Low back pain 01/01/2017  ? Medicare annual wellness visit, subsequent 09/26/2016  ? Hard of hearing 09/26/2016  ? Vitamin D deficiency 09/26/2016  ? Advance care planning 09/26/2016  ? Encounter for chronic pain management 07/26/2016  ? Edema 10/21/2014  ? Multinodular thyroid 07/07/2013  ? COPD (chronic obstructive pulmonary disease) ([place]) 06/09/2013  ? HTN (hypertension) 06/09/2013  ? Chronic respiratory failure with hypoxia ([place]) 05/13/2013  ? Cough 05/02/2013  ? Vertigo 12/31/2012  ? Acute blood loss anemia 12/13/2011  ? Osteoarthritis of right knee 08/27/2011  ? Gold C Copd with asthmatic bronchitis component and chronic resp failure 06/26/2011  ? Neck pain 04/02/2011  ? RISK OF FALLING 08/17/2010  ? Hypokalemia 05/24/2009  ? Anemia, unspecified 04/10/2008  ? Anxiety state 03/01/2008  ? Osteoporosis 06/23/2007  ? Chronic pain syndrome 12/26/2006  ? Hypothyroidism 12/24/2006  ? Hyperlipidemia with target LDL less than 70 12/24/2006  ? COMMON MIGRAINE 12/24/2006  ? GERD 12/24/2006  ? [place] DISEASE 12/24/2006  ? Fibromyalgia 12/24/2006  ? Insomnia 12/24/2006  ? HYPERGLYCEMIA 12/24/2006  ? ?PCP:  Tonia Ghent,  MD ?Pharmacy:   ?[facility] - [place], [place] - [address], [address] ?[address], [address] ?[place] [address] ?Phone: [phone] Fax: [phone] ? ?[place], [place] ?[address] ?[address] ?[place] [address] ?Phone: [phone] Fax: [phone] ? ?[facility] - [place], [place] - [address] ?[phone] ?[place] [address] ?Phone: [phone] Fax: [phone] ? ? ? ? ?Social Determinants of Health (SDOH) Interventions ?  ? ?Readmission Risk Interventions ?No flowsheet data found. ? ? ?

## 2021-09-26 NOTE — Progress Notes (Addendum)
Pt is restless and very talkative. Pt's daughter [name] at bedside states pt trying to get out bed. Pt takes Trazodone at home at bedtime. Paged Dr. Marlowe Sax about issue with pt. awaiting call back. ? ?0210- Pt given Trazodone as ordered see EMAR for details.  ?

## 2021-09-26 NOTE — NC FL2 (Signed)
?[place] MEDICAID FL2 LEVEL OF CARE SCREENING TOOL  ?  ? ?IDENTIFICATION  ?Patient Name: ?[name] Birthdate: 1930/10/21 Sex: female Admission Date (Current Location): ?09/25/2021  ?[place] and [place] Number: ? [place] ?  Facility and Address:  ?[facility] [place], [place] [address] [place], [place] [address] ?     Provider Number: ?[id number]  ?Attending Physician Name and Address:  ?Patrecia Pour, MD ? Relative Name and Phone Number:  ?[name] [phone] ?   ?Current Level of Care: ?Hospital Recommended Level of Care: ?[place] Prior Approval Number: ?  ? ?Date Approved/Denied: ?  PASRR Number: ?[id number] ? ?Discharge Plan: ?SNF ?  ? ?Current Diagnoses: ?Patient Active Problem List  ? Diagnosis Date Noted  ? Syncope 09/25/2021  ? Generalized weakness 07/07/2020  ? Decreased appetite 09/20/2019  ? Lower GI bleeding 09/09/2019  ? DNR (do not resuscitate) 09/08/2019  ? Anemia 09/07/2019  ? Macrocytic anemia 09/07/2019  ? Jerking 09/07/2019  ? Fall at home, initial encounter 09/07/2019  ? History of CVA (cerebrovascular accident) 09/07/2019  ? History of breast cancer 09/07/2019  ? Pelvic pain 03/25/2019  ? Dysphagia 02/18/2019  ? Coronary artery disease involving native coronary artery of native heart with unstable angina pectoris ([place]) 01/30/2019  ? Presence of drug coated stent in right coronary artery 01/30/2019  ? Left main coronary artery disease - ~50-60% dLM (&70% dLAD) -- Plan Med Rx 01/30/2019  ? Ischemic cardiomyopathy 01/30/2019  ? Acute ST elevation myocardial infarction (STEMI) of inferior wall ([place]) 01/29/2019  ? AAA (abdominal aortic aneurysm) 12/31/2018  ? Malignant neoplasm of upper-outer quadrant of right breast in female, estrogen receptor positive ([place]) 09/26/2018  ? Breast mass 08/28/2018  ? Bilateral impacted cerumen 08/14/2017  ? Laryngopharyngeal reflux (LPR) 08/14/2017  ? Presbycusis of both ears 08/14/2017  ? Thoracic aortic aneurysm 04/10/2017   ? Abdominal pain 04/05/2017  ? Low back pain 01/01/2017  ? Medicare annual wellness visit, subsequent 09/26/2016  ? Hard of hearing 09/26/2016  ? Vitamin D deficiency 09/26/2016  ? Advance care planning 09/26/2016  ? Encounter for chronic pain management 07/26/2016  ? Edema 10/21/2014  ? Multinodular thyroid 07/07/2013  ? COPD (chronic obstructive pulmonary disease) ([place]) 06/09/2013  ? HTN (hypertension) 06/09/2013  ? Chronic respiratory failure with hypoxia ([place]) 05/13/2013  ? Cough 05/02/2013  ? Vertigo 12/31/2012  ? Acute blood loss anemia 12/13/2011  ? Osteoarthritis of right knee 08/27/2011  ? Gold C Copd with asthmatic bronchitis component and chronic resp failure 06/26/2011  ? Neck pain 04/02/2011  ? RISK OF FALLING 08/17/2010  ? Hypokalemia 05/24/2009  ? Anemia, unspecified 04/10/2008  ? Anxiety state 03/01/2008  ? Osteoporosis 06/23/2007  ? Chronic pain syndrome 12/26/2006  ? Hypothyroidism 12/24/2006  ? Hyperlipidemia with target LDL less than 70 12/24/2006  ? COMMON MIGRAINE 12/24/2006  ? GERD 12/24/2006  ? [place] DISEASE 12/24/2006  ? Fibromyalgia 12/24/2006  ? Insomnia 12/24/2006  ? HYPERGLYCEMIA 12/24/2006  ? ? ?Orientation RESPIRATION BLADDER Height & Weight   ?  ?Self ? O2 (Nasal Cannula 4 liters)   Weight: 134 lb 14.7 oz (61.2 kg) ?Height:  5' (152.4 cm)  ?BEHAVIORAL SYMPTOMS/MOOD NEUROLOGICAL BOWEL NUTRITION STATUS  ?    Continent (WDL) Diet (Please see discharge summary)  ?AMBULATORY STATUS COMMUNICATION OF NEEDS Skin   ?Limited Assist Verbally Other (Comment) (dry,flaky,rash,breast,right) ?  ?  ?  ?    ?     ?     ? ? ?  Personal Care Assistance Level of Assistance  ?Bathing, Feeding, Dressing Bathing Assistance: Limited assistance ?Feeding assistance: Independent (can feed self) ?Dressing Assistance: Limited assistance ?   ? ?Functional Limitations Info  ?Sight, Hearing, Speech   ?Hearing Info: Impaired ?Speech Info: Adequate  ? ? ?SPECIAL CARE FACTORS FREQUENCY  ?PT (By licensed PT),  OT (By licensed OT)   ?  ?PT Frequency: 5x min weekly ?OT Frequency: 5x min weekly ?  ?  ?  ?   ? ? ?Contractures Contractures Info: Not present  ? ? ?Additional Factors Info  ?Code Status, Allergies Code Status Info: DNR ?Allergies Info: Doxycycline,Sulfadiazine,Sulfa Antibiotics ?  ?  ?  ?   ? ?Current Medications (09/26/2021):  This is the current hospital active medication list ?Current Facility-Administered Medications  ?Medication Dose Route Frequency Provider Last Rate Last Admin  ? acetaminophen (TYLENOL) tablet 650 mg  650 mg Oral Q6H PRN Patrecia Pour, MD   650 mg at 09/26/21 0853  ? Or  ? acetaminophen (TYLENOL) suppository 650 mg  650 mg Rectal Q6H PRN Patrecia Pour, MD      ? metoprolol tartrate (LOPRESSOR) injection 5 mg  5 mg Intravenous Q6H PRN Patrecia Pour, MD      ? ondansetron [facility]) tablet 4 mg  4 mg Oral Q6H PRN Patrecia Pour, MD      ? Or  ? ondansetron (ZOFRAN) injection 4 mg  4 mg Intravenous Q6H PRN Patrecia Pour, MD      ? sodium chloride flush (NS) 0.9 % injection 3 mL  3 mL Intravenous Q12H Patrecia Pour, MD   3 mL at 09/25/21 2205  ? traZODone (DESYREL) tablet 100 mg  100 mg Oral QHS Shela Leff, MD   100 mg at 09/26/21 0210  ? ? ? ?Discharge Medications: ?Please see discharge summary for a list of discharge medications. ? ?Relevant Imaging Results: ? ?Relevant Lab Results: ? ? ?Additional Information ?SSN-[id number], Both Covid Vaccines ? ?Milas Gain, LCSWA ? ? ? ? ?

## 2021-09-26 NOTE — Progress Notes (Signed)
?   09/26/21 0850  ?Assess: MEWS Score  ?Temp 98.5 ?F (36.9 ?C)  ?BP 123/77  ?Pulse Rate (!) 117  ?Resp 18  ?Level of Consciousness Alert  ?O2 Device Nasal Cannula  ?O2 Flow Rate (L/min) 4 L/min  ?Assess: MEWS Score  ?MEWS Temp 0  ?MEWS Systolic 0  ?MEWS Pulse 2  ?MEWS RR 0  ?MEWS LOC 0  ?MEWS Score 2  ?MEWS Score Color Yellow  ?Assess: if the MEWS score is Yellow or Red  ?Were vital signs taken at a resting state? Yes  ?Focused Assessment No change from prior assessment  ?Early Detection of Sepsis Score *See Row Information* Medium  ?MEWS guidelines implemented *See Row Information* Yes  ?Treat  ?Pain Scale 0-10  ?Pain Score  ?(wasnt understanding wehat was being asked)  ?Take Vital Signs  ?Increase Vital Sign Frequency  Yellow: Q 2hr X 2 then Q 4hr X 2, if remains yellow, continue Q 4hrs  ?Escalate  ?MEWS: Escalate Yellow: discuss with charge nurse/RN and consider discussing with provider and RRT  ?Notify: Charge Nurse/RN  ?Name of Charge Nurse/RN Notified Yoko RN  ?Date Charge Nurse/RN Notified 09/26/21  ?Time Charge Nurse/RN Notified [phone]  ?Notify: Provider  ?Provider Name/Title Vance Gather MD  ?Date Provider Notified 09/26/21  ?Time Provider Notified [phone]  ?Notification Type Page  ?Notification Reason Other (Comment) ?(yellow MEWS)  ?Provider response No new orders  ?Document  ?Patient Outcome Other (Comment) ?(Patient stable)  ?Progress note created (see row info) Yes  ? ? ?

## 2021-09-26 NOTE — Evaluation (Signed)
Physical Therapy Evaluation ?Patient Details ?Name: [name] ?MRN: [id number] ?DOB: Jun 15, 1931 ?Today's Date: 09/26/2021 ? ?History of Present Illness ? 88 yo admitted 3/20 after found down at home with fall vs syncope and potentially down 2 days. Imaging negative. PMhx:chronic hypoxic respiratory failure, COPD, CAD, HTN, HFrEF, CVA, hypothyroidism, cognitive decline, breast CA, CKD, fibromyalgia  ?Clinical Impression ? Pt pleasantly confused, verbose and tangential with hallucinations of other woman in room. Pt lives alone and has intermittent assist of daughter but clearly unable to live by herself at present. Pt with decreased cognition, balance, safety, transfers and gait who will benefit from acute therapy to maximize mobility and safety. Daughter present in room to provide home setup and PLOF also states pt has talked non stop for 24hrs.  ? ?Orthostatic BPs ? ?Supine 134/81  ?Sitting 160/81  ?   ?Standing 123/77  ?   ? ?HR 79-87 ?Sats 94% on 4L with inconsistent pleth and difficult to obtain accurate reading ? ?   ? ?Recommendations for follow up therapy are one component of a multi-disciplinary discharge planning process, led by the attending physician.  Recommendations may be updated based on patient status, additional functional criteria and insurance authorization. ? ?Follow Up Recommendations Skilled nursing-short term rehab (<3 hours/day) ? ?  ?Assistance Recommended at Discharge Frequent or constant Supervision/Assistance  ?Patient can return home with the following ? A little help with bathing/dressing/bathroom;A little help with walking and/or transfers;Assistance with cooking/housework;Direct supervision/assist for financial management;Assist for transportation;Direct supervision/assist for medications management ? ?  ?Equipment Recommendations None recommended by PT  ?Recommendations for Other Services ?    ?  ?Functional Status Assessment Patient has had a recent decline in their  functional status and demonstrates the ability to make significant improvements in function in a reasonable and predictable amount of time.  ? ?  ?Precautions / Restrictions Precautions ?Precautions: Fall;Other (comment) ?Precaution Comments: watch sats, posterior bias  ? ?  ? ?Mobility ? Bed Mobility ?Overal bed mobility: Needs Assistance ?Bed Mobility: Supine to Sit ?  ?  ?Supine to sit: Min assist ?  ?  ?General bed mobility comments: min assist to fully elevate HOB with increased time as pt with initial dizziness in sitting with increased time to obtain sitting balance with cues and assist for anterior translation away from posterior bias ?  ? ?Transfers ?Overall transfer level: Needs assistance ?  ?Transfers: Sit to/from Stand ?Sit to Stand: Min assist ?  ?  ?  ?  ?  ?General transfer comment: cues for hand placement with physical assist to power up and rise as well as anterior translation ?  ? ?Ambulation/Gait ?Ambulation/Gait assistance: Min assist ?Gait Distance (Feet): 20 Feet ?Assistive device: Rolling walker (2 wheels) ?Gait Pattern/deviations: Trunk flexed, Decreased stride length ?  ?Gait velocity interpretation: <1.8 ft/sec, indicate of risk for recurrent falls ?  ?General Gait Details: pt with mod cues for sequence and safety, maintaining self too posterior to RW with physical assist to direct and control pt and RW. limited by fatigue ? ?Stairs ?  ?  ?  ?  ?  ? ?Wheelchair Mobility ?  ? ?Modified Rankin (Stroke Patients Only) ?  ? ?  ? ?Balance Overall balance assessment: Needs assistance ?  ?Sitting balance-Leahy Scale: Poor ?Sitting balance - Comments: initial mod assist for sitting balance with progression to minguard over grossly 2 min ?  ?Standing balance support: Bilateral upper extremity supported ?Standing balance-Leahy Scale: Poor ?Standing balance comment: bil UE support on RW  and physical assist to maintain balance ?  ?  ?  ?  ?  ?  ?  ?  ?  ?  ?  ?   ? ? ? ?Pertinent Vitals/Pain Pain  Assessment ?Pain Assessment: No/denies pain  ? ? ?Home Living Family/patient expects to be discharged to:: Private residence ?Living Arrangements: Alone ?Available Help at Discharge: Family;Available PRN/intermittently ?Type of Home: House ?Home Access: Ramped entrance ?  ?  ?  ?Home Layout: One level ?Home Equipment: Rollator (4 wheels);BSC/3in1;Shower seat;Wheelchair - manual;Grab bars - toilet;Grab bars - tub/shower ?   ?  ?Prior Function Prior Level of Function : Needs assist ?  ?  ?  ?  ?  ?  ?Mobility Comments: walks with rollator ?ADLs Comments: doesn't drive, family does the cooking and cleaning ?  ? ? ?Hand Dominance  ?   ? ?  ?Extremity/Trunk Assessment  ? Upper Extremity Assessment ?Upper Extremity Assessment: Generalized weakness ?  ? ?Lower Extremity Assessment ?Lower Extremity Assessment: Generalized weakness ?  ? ?Cervical / Trunk Assessment ?Cervical / Trunk Assessment: Normal  ?Communication  ? Communication: HOH  ?Cognition Arousal/Alertness: Awake/alert ?Behavior During Therapy: Restless ?Overall Cognitive Status: Impaired/Different from baseline ?Area of Impairment: Memory, Attention, Following commands, Safety/judgement, Problem solving, Awareness ?  ?  ?  ?  ?  ?  ?  ?  ?  ?Current Attention Level: Sustained ?Memory: Decreased short-term memory ?Following Commands: Follows one step commands consistently ?Safety/Judgement: Decreased awareness of deficits, Decreased awareness of safety ?Awareness: Intellectual ?Problem Solving: Requires verbal cues, Requires tactile cues ?General Comments: pt oriented to person, place, time with pt verbose and tangential. Pt hallucinating and seeing additional female not present in room. ?  ?  ? ?  ?General Comments   ? ?  ?Exercises    ? ?Assessment/Plan  ?  ?PT Assessment Patient needs continued PT services  ?PT Problem List Decreased strength;Decreased mobility;Decreased coordination;Decreased safety awareness;Decreased cognition;Decreased activity  tolerance;Decreased balance;Decreased knowledge of use of DME;Cardiopulmonary status limiting activity ? ?   ?  ?PT Treatment Interventions Gait training;Balance training;DME instruction;Therapeutic exercise;Functional mobility training;Cognitive remediation;Patient/family education;Therapeutic activities;Neuromuscular re-education   ? ?PT Goals (Current goals can be found in the Care Plan section)  ?Acute Rehab PT Goals ?Patient Stated Goal: return to walking and improved cognition ?PT Goal Formulation: With patient/family ?Time For Goal Achievement: 10/10/21 ?Potential to Achieve Goals: Fair ? ?  ?Frequency Min 3X/week ?  ? ? ?Co-evaluation   ?  ?  ?  ?  ? ? ?  ?AM-PAC PT "6 Clicks" Mobility  ?Outcome Measure Help needed turning from your back to your side while in a flat bed without using bedrails?: A Little ?Help needed moving from lying on your back to sitting on the side of a flat bed without using bedrails?: A Little ?Help needed moving to and from a bed to a chair (including a wheelchair)?: A Lot ?Help needed standing up from a chair using your arms (e.g., wheelchair or bedside chair)?: A Little ?Help needed to walk in hospital room?: A Lot ?Help needed climbing 3-5 steps with a railing? : Total ?6 Click Score: 14 ? ?  ?End of Session Equipment Utilized During Treatment: Gait belt ?Activity Tolerance: Patient tolerated treatment well ?Patient left: in bed;with call bell/phone within reach;with bed alarm set (returned to bed via daughter's request for not up to chair at this time) ?Nurse Communication: Mobility status ?PT Visit Diagnosis: Other abnormalities of gait and mobility (R26.89);Muscle weakness (generalized) (M62.81);Difficulty in  walking, not elsewhere classified (R26.2) ?  ? ?Time: 4332-9518 ?PT Time Calculation (min) (ACUTE ONLY): 29 min ? ? ?Charges:   PT Evaluation ?$PT Eval Moderate Complexity: 1 Mod ?PT Treatments ?$Therapeutic Activity: 8-22 mins ?  ?   ? ? ?Alyx Gee P, PT ?Acute Rehabilitation  Services ?Pager: [phone] ?Office: [phone] ? ? ?Misha Vanoverbeke B Eugenio Dollins ?09/26/2021, 8:31 AM ? ?

## 2021-09-26 NOTE — Care Management Obs Status (Signed)
MEDICARE OBSERVATION STATUS NOTIFICATION ? ? ?Patient Details  ?Name: [name] ?MRN: [id number] ?Date of Birth: August 07, 1930 ? ? ?Medicare Observation Status Notification Given:  Yes ? ? ? ?Zenon Mayo, RN ?09/26/2021, 4:54 PM ?

## 2021-09-26 NOTE — Telephone Encounter (Signed)
Called pt but answer service was full/ I also sent my chart letter for pt to schedule f/u/ov ?

## 2021-09-26 NOTE — Progress Notes (Signed)
?Progress Note ? ?Patient: [name] TFT:[id number] DOB: 04-16-1931  ?DOA: 09/25/2021  DOS: 09/26/2021  ?  ?Brief hospital course: ?[name] is an 88 y.o. female with a history of chronic hypoxic respiratory failure, COPD, CAD, HTN, HFrEF, CVA, hypothyroidism, covid-19 diagnosed 07/07/2021, and reports of cognitive decline who presented from home after being found down in her bathroom. ?  ?The patient has relative amnesia for all events surrounding this incident. She was last seen well 3/17 when her daughter dropped her off at home. The family had trouble getting in contact with her, so called for a welfare check this morning where she was found down, incontinent, not wearing her home O2, and confused. She reported pain all over (actually a baseline complaint due to fibromyalgia) and diffuse weakness, no focal weakness/numbness or pain. Unable to recall anything about these events. Work up in the ED was relatively reassuring. WBC normal, hgb 10.9 (at baseline), SCr 0.63, BUN 14, K 3.4, bicarb 33. Troponin 47, CK 895. ECG NSR. Radiographic evaluation of right knee, pelvis, cervical spine and head nonacute. Hospitalists contacted due to persistent amnesia for the event, unclear etiology of episode, and unsafe disposition home at this time.  ? ?Cardiac monitoring has revealed no dysrhythmias, no orthostatic hypotension is noted on assessment. The patient is developing acute delirium which is typical of prior hospitalizations and is not, at this time, able to cooperate for MR brain. Her current level of debility requires rehabilitation at SNF which is pursued. She remains hemodynamically stable for discharge when safe disposition is available. ? ?Assessment and Plan: ?Found down, generalized weakness, concern for syncope: Possibly right-sided predominance to weakness. CT head nonacute. CT cervical spine and radiographs of knee, pelvis non acute.  ?- PT/OT. Agree with their assessment that she will  benefit from SNF, has been to [place], would like to return. CSW aware and working diligently.  ?- Due to acute delirium (has become less redirectable and more forgetful, etc. since admission), we will have to defer MRI brain at this time as the risk of sedating the patient outweighs potential diagnostic yield. ?- Continue cardiac monitoring. ECG w/NSR on intake, no dysrhythmias on my personal review today. ?- Orthostatic vital signs negative. ?  ?Altered mental status, chronic cerebrovascular disease, history of stroke: No acute findings, though CT head showed atrophy and chronic microvascular changes. She is oriented but had features very consistent with cognitive impairment. Based on reported chronicity, suspect dementia/MCI.  ?- MMSE not validated in inpatient environment, recommend neuropsych battery after discharge.  ?- Delirium precautions ?- Notes involving PCP in outpatient setting indicate family worried about cognitive decline at least a year ago. This patient will certainly need increased assistance at home. Her daughter has been spending most of the time there. Suspect after leaving SNF she will be best served in ILF/ALF environment ? ?Symptomatic bacteriuria: Pt actually reports some urinary urgency and has had AMS precipitated by UTI (no other cause found at that time).  ?- Will give fosfomycin x1 and monitor urine culture.  ?  ?Anxiety:  ?- Restart home xanax, continue trazodone. Pt reportedly not taking remeron. Appropriately, +benzo on UDS, otherwise tox screens negative.  ?  ?CAD s/p remote DES with suspected demand myocardial ischemia, HTN, HLD:  ?- Troponin elevated mildly, relatively flat trend, no ischemic changes on ECG and no chest pain. No NSTEMI.  ?- Continue plavix, rosuvastatin. Apparently no longer on beta blocker. ?  ?Chronic HFrEF: Last echo showed LVEF 40-45%, no RV dysfunction.  Appears euvolemic. ?- Hold IVF and diuretics for now.  ?- Restart home meds  ?  ?Breast CA s/p lumpectomy:  Was followed by Dr. Jana Hakim.  ?- Continue anastrozole   ?  ?Hematuria:  ?- Recheck at follow up, consider work up if persistent. ?  ?AST elevation: Unclear significance, possibly related to mild rhabdomyolysis with elevated CK.  ?- Can recheck at follow up to confirm improvement. ? ?Subjective: Pt says I look just like my dad, [name] (?). She has no complaints, though does confirm her chronic "pain everywhere." No chest pain, dyspnea, palpitations on ROS. Had delirium last night seeing things that weren't there and didn't sleep which improved with trazodone. ? ?Objective: ?Vitals:  ? 09/26/21 0432 09/26/21 0653 09/26/21 0850 09/26/21 1157  ?BP: 136/78  123/77   ?Pulse: 79 94 (!) 117   ?Resp:   18 18  ?Temp: 98.4 ?F (36.9 ?C)  98.5 ?F (36.9 ?C) 97.9 ?F (36.6 ?C)  ?TempSrc: Oral  Oral Oral  ?SpO2: 98% 93%    ?Weight:      ?Height:      ? ?Gen: Elderly alert female in no distress ?Pulm: Nonlabored breathing room air. ?CV: Regular rate and rhythm. No murmur, rub, or gallop. No JVD, trace dependent edema. ?GI: Abdomen soft, non-tender, non-distended, with normoactive bowel sounds.  ?Ext: Warm, no deformities ?Skin: No new rashes, lesions or ulcers on visualized skin. ?Neuro: Alert and not oriented. No focal neurological deficits. ?Psych: Judgement and insight appear impaired. Mood euthymic & talkative. ? ?Data Personally reviewed: ?CBC: ?Recent Labs  ?Lab 09/25/21 ?1405  ?WBC 9.4  ?NEUTROABS 8.3*  ?HGB 10.9*  ?HCT 33.6*  ?MCV 97.1  ?PLT 187  ? ?Basic Metabolic Panel: ?Recent Labs  ?Lab 09/25/21 ?1405  ?NA 139  ?K 3.4*  ?CL 95*  ?CO2 33*  ?GLUCOSE 114*  ?BUN 14  ?CREATININE 0.63  ?CALCIUM 9.3  ? ?GFR: ?Estimated Creatinine Clearance: 39.7 mL/min (by C-G formula based on SCr of 0.63 mg/dL). ?Liver Function Tests: ?Recent Labs  ?Lab 09/25/21 ?1405  ?AST 61*  ?ALT 21  ?ALKPHOS 43  ?BILITOT 1.1  ?PROT 7.3  ?ALBUMIN 3.7  ? ?No results for input(s): LIPASE, AMYLASE in the last 168 hours. ?No results for input(s): AMMONIA  in the last 168 hours. ?Coagulation Profile: ?No results for input(s): INR, PROTIME in the last 168 hours. ?Cardiac Enzymes: ?Recent Labs  ?Lab 09/25/21 ?1405  ?CKTOTAL 895*  ? ?BNP (last 3 results) ?No results for input(s): PROBNP in the last 8760 hours. ?HbA1C: ?No results for input(s): HGBA1C in the last 72 hours. ?CBG: ?Recent Labs  ?Lab 09/25/21 ?1412  ?GLUCAP 98  ? ?Lipid Profile: ?No results for input(s): CHOL, HDL, LDLCALC, TRIG, CHOLHDL, LDLDIRECT in the last 72 hours. ?Thyroid Function Tests: ?No results for input(s): TSH, T4TOTAL, FREET4, T3FREE, THYROIDAB in the last 72 hours. ?Anemia Panel: ?No results for input(s): VITAMINB12, FOLATE, FERRITIN, TIBC, IRON, RETICCTPCT in the last 72 hours. ?Urine analysis: ?   ?Component Value Date/Time  ? [place] (A) 09/25/2021 1433  ? APPEARANCEUR CLEAR 09/25/2021 1433  ? LABSPEC 1.026 09/25/2021 1433  ? PHURINE 6.0 09/25/2021 1433  ? GLUCOSEU NEGATIVE 09/25/2021 1433  ? GLUCOSEU NEGATIVE 10/24/2016 1429  ? HGBUR MODERATE (A) 09/25/2021 1433  ? [place] NEGATIVE 09/25/2021 1433  ? BILIRUBINUR negative 09/20/2016 1625  ? KETONESUR 80 (A) 09/25/2021 1433  ? PROTEINUR 100 (A) 09/25/2021 1433  ? UROBILINOGEN 0.2 10/24/2016 1429  ? NITRITE NEGATIVE 09/25/2021 1433  ? LEUKOCYTESUR  NEGATIVE 09/25/2021 1433  ? ?Recent Results (from the past 240 hour(s))  ?Urine Culture     Status: Abnormal  ? Collection Time: 09/25/21  1:39 PM  ? Specimen: Urine, Clean Catch  ?Result Value Ref Range Status  ? Specimen Description URINE, CLEAN CATCH  Final  ? Special Requests   Final  ?  NONE ?Performed at [facility], [place] [address], [place], [place] [address] ?  ? Culture MULTIPLE SPECIES PRESENT, SUGGEST RECOLLECTION (A)  Final  ? Report Status 09/26/2021 FINAL  Final  ?   ?DG Chest 2 View ? ?Result Date: 09/25/2021 ?CLINICAL DATA:  Trauma, fall EXAM: CHEST - 2 VIEW COMPARISON:  07/07/2020 FINDINGS: Transverse diameter of heart is in the upper limits of normal. Thoracic  aorta is tortuous and ectatic. Lung fields are clear of any infiltrates or pulmonary edema. There is no pleural effusion or pneumothorax. There is previous right shoulder arthroplasty. IMPRESSION: No acti

## 2021-09-27 DIAGNOSIS — I130 Hypertensive heart and chronic kidney disease with heart failure and stage 1 through stage 4 chronic kidney disease, or unspecified chronic kidney disease: Secondary | ICD-10-CM | POA: Diagnosis not present

## 2021-09-27 DIAGNOSIS — Z20822 Contact with and (suspected) exposure to covid-19: Secondary | ICD-10-CM | POA: Diagnosis not present

## 2021-09-27 DIAGNOSIS — E039 Hypothyroidism, unspecified: Secondary | ICD-10-CM | POA: Diagnosis not present

## 2021-09-27 DIAGNOSIS — I2510 Atherosclerotic heart disease of native coronary artery without angina pectoris: Secondary | ICD-10-CM | POA: Diagnosis not present

## 2021-09-27 DIAGNOSIS — Z9981 Dependence on supplemental oxygen: Secondary | ICD-10-CM | POA: Diagnosis not present

## 2021-09-27 DIAGNOSIS — D649 Anemia, unspecified: Secondary | ICD-10-CM | POA: Diagnosis not present

## 2021-09-27 DIAGNOSIS — I214 Non-ST elevation (NSTEMI) myocardial infarction: Secondary | ICD-10-CM | POA: Diagnosis not present

## 2021-09-27 DIAGNOSIS — J449 Chronic obstructive pulmonary disease, unspecified: Secondary | ICD-10-CM | POA: Diagnosis not present

## 2021-09-27 DIAGNOSIS — J45909 Unspecified asthma, uncomplicated: Secondary | ICD-10-CM | POA: Diagnosis not present

## 2021-09-27 DIAGNOSIS — R531 Weakness: Secondary | ICD-10-CM | POA: Diagnosis not present

## 2021-09-27 DIAGNOSIS — J9611 Chronic respiratory failure with hypoxia: Secondary | ICD-10-CM | POA: Diagnosis not present

## 2021-09-27 DIAGNOSIS — Z79899 Other long term (current) drug therapy: Secondary | ICD-10-CM | POA: Diagnosis not present

## 2021-09-27 DIAGNOSIS — Z7401 Bed confinement status: Secondary | ICD-10-CM | POA: Diagnosis not present

## 2021-09-27 DIAGNOSIS — R55 Syncope and collapse: Secondary | ICD-10-CM | POA: Diagnosis not present

## 2021-09-27 DIAGNOSIS — F330 Major depressive disorder, recurrent, mild: Secondary | ICD-10-CM | POA: Diagnosis not present

## 2021-09-27 DIAGNOSIS — Z66 Do not resuscitate: Secondary | ICD-10-CM | POA: Diagnosis not present

## 2021-09-27 DIAGNOSIS — F03918 Unspecified dementia, unspecified severity, with other behavioral disturbance: Secondary | ICD-10-CM | POA: Diagnosis not present

## 2021-09-27 DIAGNOSIS — F5101 Primary insomnia: Secondary | ICD-10-CM | POA: Diagnosis not present

## 2021-09-27 DIAGNOSIS — I712 Thoracic aortic aneurysm, without rupture, unspecified: Secondary | ICD-10-CM | POA: Diagnosis not present

## 2021-09-27 DIAGNOSIS — K219 Gastro-esophageal reflux disease without esophagitis: Secondary | ICD-10-CM | POA: Diagnosis not present

## 2021-09-27 DIAGNOSIS — F411 Generalized anxiety disorder: Secondary | ICD-10-CM | POA: Diagnosis not present

## 2021-09-27 DIAGNOSIS — W19XXXA Unspecified fall, initial encounter: Secondary | ICD-10-CM | POA: Diagnosis not present

## 2021-09-27 DIAGNOSIS — N390 Urinary tract infection, site not specified: Secondary | ICD-10-CM | POA: Diagnosis not present

## 2021-09-27 DIAGNOSIS — I5032 Chronic diastolic (congestive) heart failure: Secondary | ICD-10-CM | POA: Diagnosis not present

## 2021-09-27 DIAGNOSIS — N189 Chronic kidney disease, unspecified: Secondary | ICD-10-CM | POA: Diagnosis not present

## 2021-09-27 DIAGNOSIS — I25110 Atherosclerotic heart disease of native coronary artery with unstable angina pectoris: Secondary | ICD-10-CM | POA: Diagnosis not present

## 2021-09-27 LAB — RESP PANEL BY RT-PCR (FLU A&B, COVID) ARPGX2
Influenza A by PCR: NEGATIVE
Influenza B by PCR: NEGATIVE
SARS Coronavirus 2 by RT PCR: NEGATIVE

## 2021-09-27 MED ORDER — ALPRAZOLAM 1 MG PO TABS
0.5000 mg | ORAL_TABLET | Freq: Three times a day (TID) | ORAL | 0 refills | Status: DC | PRN
Start: 2021-09-27 — End: 2021-10-24

## 2021-09-27 NOTE — Hospital Course (Signed)
[name] is an 88 y.o. female with a history of chronic hypoxic respiratory failure, COPD, CAD, HTN, HFrEF, CVA, hypothyroidism, covid-19 diagnosed 07/07/2021, and reports of cognitive decline who presented from home after being found down in her bathroom. ?  ?The patient has relative amnesia for all events surrounding this incident. She was last seen well 3/17 when her daughter dropped her off at home. The family had trouble getting in contact with her, so called for a welfare check this morning where she was found down, incontinent, not wearing her home O2, and confused. She reported pain all over (actually a baseline complaint due to fibromyalgia) and diffuse weakness, no focal weakness/numbness or pain. Unable to recall anything about these events. Work up in the ED was relatively reassuring. WBC normal, hgb 10.9 (at baseline), SCr 0.63, BUN 14, K 3.4, bicarb 33. Troponin 47, CK 895. ECG NSR. Radiographic evaluation of right knee, pelvis, cervical spine and head nonacute. Hospitalists contacted due to persistent amnesia for the event, unclear etiology of episode, and unsafe disposition home at this time.  ?  ?Cardiac monitoring has revealed no dysrhythmias, no orthostatic hypotension is noted on assessment. MR brain was recommended but deferred due to patient's hospital delirium. Her current level of debility requires rehabilitation at SNF which is pursued. She remains hemodynamically stable for discharge when safe disposition is available. ?

## 2021-09-27 NOTE — TOC Transition Note (Signed)
Transition of Care (TOC) - CM/SW Discharge Note ? ? ?Patient Details  ?Name: [name] ?MRN: [id number] ?Date of Birth: 1930-12-22 ? ?Transition of Care (TOC) CM/SW Contact:  ?Coralee Pesa, LCSWA ?Phone Number: ?09/27/2021, 2:08 PM ? ? ?Clinical Narrative:    ?Pt to be transported to [facility] via [facility]. ?Nurse to call report to [phone]. ?Rm# [phone] ? ? ?Final next level of care: [place] ?Barriers to Discharge: Barriers Resolved ? ? ?Patient Goals and CMS Choice ?Patient states their goals for this hospitalization and ongoing recovery are:: SNF ?CMS Medicare.gov Compare Post Acute Care list provided to:: Patient Represenative (must comment) (patients daughter [name] patients POA) ?Choice offered to / list presented to : Adult Children (patients daughter [name]) ? ?Discharge Placement ?  ?           ?Patient chooses bed at: [facility] ?Patient to be transferred to facility by: [facility] ?Name of family member notified: [name] ?Patient and family notified of of transfer: 09/27/21 ? ?Discharge Plan and Services ?In-house Referral: Clinical Social Work ?  ?           ?  ?  ?  ?  ?  ?  ?  ?  ?  ?  ? ?Social Determinants of Health (SDOH) Interventions ?  ? ? ?Readmission Risk Interventions ?   ? View : No data to display.  ?  ?  ?  ? ? ? ? ? ?

## 2021-09-27 NOTE — Consult Note (Signed)
? ?  [facility] CM Inpatient Consult ? ? ?09/27/2021 ? ?[name] ?[birth year] ?[id number] ? ?[facility] [ACO] Patient: [name] Medicare PPO ? ?Primary Care Provider:  Tonia Ghent, MD  at [facility] is an embedded provider with a Chronic Care Management team and program, and is listed for the transition of care follow up and appointments. ? ?Patient was screened for Embedded practice service needs for chronic care management and patient has been active in the Embedded CCM program with [facility] Embedded social worker and with Embedded pharmacist noted. There were noted encounters that the team had difficulty maintaining contact. ? ?Plan: Continue to follow for progress and needs as patient is being recommended for a skilled nursing facility level of care for rehab. Patient is currently for [facility]. ? ?Please contact for further questions, ? ?Natividad Brood, RN BSN CCM ?[facility] Liaison ? [phone] business mobile phone ?Toll free office [phone]  ?Fax number: [phone] ?[email or web address] ? ? ? ? ? ?

## 2021-09-27 NOTE — Plan of Care (Signed)
?  Problem: Clinical Measurements: ?Goal: Diagnostic test results will improve ?Outcome: Progressing ?Goal: Respiratory complications will improve ?Outcome: Progressing ?Goal: Cardiovascular complication will be avoided ?Outcome: Progressing ?  ?Problem: Elimination: ?Goal: Will not experience complications related to urinary retention ?Outcome: Progressing ?  ?Problem: Pain Managment: ?Goal: General experience of comfort will improve ?Outcome: Progressing ?  ?Problem: Safety: ?Goal: Ability to remain free from injury will improve ?Outcome: Progressing ?  ?Problem: Skin Integrity: ?Goal: Risk for impaired skin integrity will decrease ?Outcome: Progressing ?  ?

## 2021-09-27 NOTE — Progress Notes (Signed)
Report called to [facility], given to [facility].  ?

## 2021-09-27 NOTE — Discharge Summary (Addendum)
?Physician Discharge Summary ?  ?Patient: [name] MRN: [id number] DOB: 1931/03/01  ?Admit date:     09/25/2021  ?Discharge date: 09/27/21  ?Discharge Physician: Patrecia Pour  ? ?PCP: Tonia Ghent, MD  ? ?Recommendations at discharge:  ?Consider brain MRI, and formal neuropsychologic testing as outpatient. ?Follow up with PCP and/or cardiology for continue GDMT, home meds were continued. ?Recheck CMP at follow up. ? ?Discharge Diagnoses: ?Active Problems: ?  Coronary artery disease involving native coronary artery of native heart with unstable angina pectoris ([place]) ?  Hypothyroidism ?  Hypokalemia ?  Anemia, unspecified ?  Anxiety state ?  GERD ?  Vertigo ?  Chronic respiratory failure with hypoxia (HCC) ?  COPD (chronic obstructive pulmonary disease) ([place]) ?  HTN (hypertension) ?  Hard of hearing ?  Thoracic aortic aneurysm ?  Malignant neoplasm of upper-outer quadrant of right breast in female, estrogen receptor positive ([place]) ?  Fall at home, initial encounter ?  History of CVA (cerebrovascular accident) ?  DNR (do not resuscitate) ?  Generalized weakness ?  Syncope ? ?Hospital Course: ?[name] is an 88 y.o. female with a history of chronic hypoxic respiratory failure, COPD, CAD, HTN, HFrEF, CVA, hypothyroidism, covid-19 diagnosed 07/07/2021, and reports of cognitive decline who presented from home after being found down in her bathroom. ?  ?The patient has relative amnesia for all events surrounding this incident. She was last seen well 3/17 when her daughter dropped her off at home. The family had trouble getting in contact with her, so called for a welfare check this morning where she was found down, incontinent, not wearing her home O2, and confused. She reported pain all over (actually a baseline complaint due to fibromyalgia) and diffuse weakness, no focal weakness/numbness or pain. Unable to recall anything about these events. Work up in the ED was relatively reassuring. WBC  normal, hgb 10.9 (at baseline), SCr 0.63, BUN 14, K 3.4, bicarb 33. Troponin 47, CK 895. ECG NSR. Radiographic evaluation of right knee, pelvis, cervical spine and head nonacute. Hospitalists contacted due to persistent amnesia for the event, unclear etiology of episode, and unsafe disposition home at this time.  ?  ?Cardiac monitoring has revealed no dysrhythmias, no orthostatic hypotension is noted on assessment. MR brain was recommended but deferred due to patient's hospital delirium. Her current level of debility requires rehabilitation at SNF which is pursued. She remains hemodynamically stable for discharge when safe disposition is available. ? ?Assessment and Plan: ?Found down, generalized weakness, concern for syncope: Possibly right-sided predominance to weakness. CT head nonacute. CT cervical spine and radiographs of knee, pelvis non acute. Orthostatic vital signs negative. ECG w/NSR on intake, no dysrhythmias on cardiac monitoring throughout hospitalization at the time of this dictation.  ?- PT/OT. Agree with their assessment that she will benefit from SNF. ? ?Altered mental status, chronic cerebrovascular disease, history of stroke: No acute findings, though CT head showed atrophy and chronic microvascular changes. She is oriented but had features very consistent with cognitive impairment. Based on reported chronicity, suspect dementia/MCI.  ?- MMSE not validated in inpatient environment, recommend neuropsych battery after discharge.  ?- Delirium improving, continue typical delirium precautions, reviewed with daughter. ?- Due to acute delirium, we will have to defer MRI brain at this time as the risk of sedating the patient outweighs potential diagnostic yield. ?- Notes involving PCP in outpatient setting indicate family worried about cognitive decline at least a year ago. This patient will certainly need increased  assistance at home. Her daughter has been spending most of the time there. Suspect after  leaving SNF she will be best served in ILF/ALF environment ?  ?Symptomatic bacteriuria: Pt actually reports some urinary urgency and has had AMS precipitated by UTI in the past (no other cause found at that time).  ?- s/p fosfomycin x1 on 3/21. Urine culture nonclonal.  ?  ?Chronic hypoxic respiratory failure: At baseline.  ?- continue home O2 supplementation and bronchodilators. No exacerbation noted.  ? ?Hypothyroidism:  ?- Continue home synthroid. ? ?Anxiety:  ?- Well-controlled on home medications trazodone, prn low dose xanax (no oversedation noted). Pt reportedly not taking remeron. Appropriately, +benzo on UDS, otherwise tox screens negative.  ?  ?CAD s/p remote DES with suspected demand myocardial ischemia, HTN, HLD:  ?- Troponin elevated mildly, relatively flat trend, no ischemic changes on ECG and no chest pain. No NSTEMI.  ?- Continue plavix, rosuvastatin. Apparently no longer on beta blocker. Suggest cardiology follow up. ?  ?Chronic HFrEF: Last echo showed LVEF 40-45%, no RV dysfunction. Appears euvolemic. ?- Hold IVF and diuretics for now.  ?- Restart home meds, defer further initiation of GDMT to outpatient providers. ?  ?Breast CA s/p lumpectomy: Was followed by Dr. Jana Hakim.  ?- Continue anastrozole   ?  ?Hematuria:  ?- Recheck at follow up, consider work up if persistent. ?  ?AST elevation: Unclear significance, possibly related to mild rhabdomyolysis with elevated CK.  ?- Can recheck at follow up to confirm improvement. ? ?DNR: POA, noted. ? ?Thoracic aortic aneurysm: Stable from previous ?- Continue surveillance. ? ?Consultants: None ?Procedures performed: None  ?Disposition: Skilled nursing facility ?Diet recommendation:  ?Cardiac diet ?DISCHARGE MEDICATION: ?Allergies as of 09/27/2021   ? ?   Reactions  ? Doxycycline Other (See Comments)  ? GI upset  ? Sulfadiazine Other (See Comments)  ? REACTION: Fever, aches  ? Sulfa Antibiotics Rash  ? ?  ? ?  ?Medication List  ?  ? ?STOP taking these  medications   ? ?mirtazapine 7.5 MG tablet ?Commonly known as: REMERON ?  ? ?  ? ?TAKE these medications   ? ?acetaminophen 500 MG tablet ?Commonly known as: TYLENOL ?Take 1,000 mg by mouth every 6 (six) hours as needed for moderate pain or headache. ?  ?albuterol 108 (90 Base) MCG/ACT inhaler ?Commonly known as: ProAir HFA ?Inhale 1-2 puffs into the lungs every 6 (six) hours as needed for wheezing or shortness of breath (Okay to fill with Ventolin or albuterol HFA). ?  ?alendronate 70 MG tablet ?Commonly known as: FOSAMAX ?Take 70 mg by mouth every Monday. ?  ?ALPRAZolam 1 MG tablet ?Commonly known as: Duanne Moron ?Take 0.5 tablets (0.5 mg total) by mouth 3 (three) times daily as needed for anxiety. ?What changed: See the new instructions. ?  ?anastrozole 1 MG tablet ?Commonly known as: ARIMIDEX ?Take 1 tablet (1 mg total) by mouth daily. ?  ?busPIRone 5 MG tablet ?Commonly known as: BUSPAR ?TAKE ONE TABLET BY MOUTH IN THE MORNING AND IN THE EVENING ?What changed: See the new instructions. ?  ?CALTRATE 600 PO ?Take 600 mg by mouth daily. ?  ?cholecalciferol 25 MCG (1000 UNIT) tablet ?Commonly known as: VITAMIN D ?Take 1 tablet (1,000 Units total) by mouth daily. ?  ?clopidogrel 75 MG tablet ?Commonly known as: PLAVIX ?Take 1 tablet (75 mg total) by mouth daily. Please make overdue appt with Dr. Tamala Julian before anymore refills. Thank you 3rd and Final attempt ?What changed: additional instructions ?  ?  diclofenac Sodium 1 % Gel ?Commonly known as: [employer] Diclofenac Sodium ?Apply 2 g topically 4 (four) times daily as needed. ?What changed: reasons to take this ?  ?DULoxetine 60 MG capsule ?Commonly known as: CYMBALTA ?TAKE ONE CAPSULE BY MOUTH DAILY ?  ?ferrous sulfate 325 (65 FE) MG tablet ?Commonly known as: FerrouSul ?Take 1 tablet (325 mg total) by mouth daily with breakfast. ?  ?fluticasone 50 MCG/ACT nasal spray ?Commonly known as: FLONASE ?PLACE 2 SPRAYS INTO BOTH NOSTRILS DAILY. ?What changed:  ?when to take this ?reasons  to take this ?  ?levothyroxine 125 MCG tablet ?Commonly known as: SYNTHROID ?TAKE ONE TABLET BY MOUTH DAILY ?What changed:  ?how much to take ?how to take this ?when to take this ?additional instructions ?Anot

## 2021-09-28 DIAGNOSIS — J9611 Chronic respiratory failure with hypoxia: Secondary | ICD-10-CM | POA: Diagnosis not present

## 2021-09-28 DIAGNOSIS — I2510 Atherosclerotic heart disease of native coronary artery without angina pectoris: Secondary | ICD-10-CM | POA: Diagnosis not present

## 2021-09-28 DIAGNOSIS — J449 Chronic obstructive pulmonary disease, unspecified: Secondary | ICD-10-CM | POA: Diagnosis not present

## 2021-09-28 DIAGNOSIS — I712 Thoracic aortic aneurysm, without rupture, unspecified: Secondary | ICD-10-CM | POA: Diagnosis not present

## 2021-10-02 ENCOUNTER — Telehealth: Payer: Self-pay | Admitting: Medical Oncology

## 2021-10-02 ENCOUNTER — Other Ambulatory Visit: Payer: Self-pay

## 2021-10-02 MED ORDER — ALENDRONATE SODIUM 70 MG PO TABS: 70.0000 mg | ORAL_TABLET | ORAL | 1 refills | Status: DC

## 2021-10-02 NOTE — Telephone Encounter (Signed)
[facility] requested refill Alendronate.  ?

## 2021-10-09 DIAGNOSIS — J9611 Chronic respiratory failure with hypoxia: Secondary | ICD-10-CM | POA: Diagnosis not present

## 2021-10-09 DIAGNOSIS — J449 Chronic obstructive pulmonary disease, unspecified: Secondary | ICD-10-CM | POA: Diagnosis not present

## 2021-10-09 DIAGNOSIS — I712 Thoracic aortic aneurysm, without rupture, unspecified: Secondary | ICD-10-CM | POA: Diagnosis not present

## 2021-10-09 DIAGNOSIS — I2510 Atherosclerotic heart disease of native coronary artery without angina pectoris: Secondary | ICD-10-CM | POA: Diagnosis not present

## 2021-10-20 ENCOUNTER — Other Ambulatory Visit: Payer: Self-pay | Admitting: Family Medicine

## 2021-10-23 ENCOUNTER — Telehealth: Payer: Self-pay | Admitting: Family Medicine

## 2021-10-23 ENCOUNTER — Inpatient Hospital Stay: Payer: Medicare PPO | Admitting: Family Medicine

## 2021-10-23 NOTE — Telephone Encounter (Signed)
Pt called this morning saying she is back from the hospital and got a call about Dr. Damita Dunnings maybe doing a house call for her on Monday, she did not know what date it was for.  ?

## 2021-10-23 NOTE — Telephone Encounter (Signed)
Patient had to reschedule appt for 11/02/21 due to [facility] coming out to see her at 2:30 pm today ?

## 2021-10-23 NOTE — Telephone Encounter (Signed)
Refill request for Alprazolam 1 mg tablets ? ?LOV - 11/01/20 ?Next OV - 11/02/21 ?Last refill - 09/27/21 #5/0 ? ?

## 2021-10-24 NOTE — Telephone Encounter (Signed)
Sent. Thanks.  ?Has OV pending.  ?

## 2021-10-31 DIAGNOSIS — H353211 Exudative age-related macular degeneration, right eye, with active choroidal neovascularization: Secondary | ICD-10-CM | POA: Diagnosis not present

## 2021-11-02 ENCOUNTER — Ambulatory Visit: Payer: Medicare PPO | Admitting: Family Medicine

## 2021-11-02 ENCOUNTER — Encounter: Payer: Self-pay | Admitting: Family Medicine

## 2021-11-02 ENCOUNTER — Other Ambulatory Visit: Payer: Self-pay | Admitting: Family Medicine

## 2021-11-02 VITALS — BP 130/72 | HR 80 | Temp 97.6°F | Ht 60.0 in | Wt 137.0 lb

## 2021-11-02 DIAGNOSIS — J9611 Chronic respiratory failure with hypoxia: Secondary | ICD-10-CM | POA: Diagnosis not present

## 2021-11-02 DIAGNOSIS — I10 Essential (primary) hypertension: Secondary | ICD-10-CM | POA: Diagnosis not present

## 2021-11-02 DIAGNOSIS — E559 Vitamin D deficiency, unspecified: Secondary | ICD-10-CM | POA: Diagnosis not present

## 2021-11-02 NOTE — Progress Notes (Signed)
Inpatient f/u.  She doesn't recall much about being inpatient.  She was found down at home, admitted with altered mental status/delirium and symptomatic bacteriuria along with chronic hypoxic respiratory failure.  She was treated supportively and her mentation gradually improved.  She improved to the point of being discharged to a facility.  She was at [facility] after inpatient stay, back home now for a few weeks.  Daughter is helping with meds.  Others coming in to help, ie son in law helping with finances.  She has RN, OT and PT coming in at home.  Family is helping with meals.   ? ?Her preference is to have intermittent help at home.  Discussed options, ie extra help at home along with a fall button versus facility placement. ? ?Her memory improved from inpatient status in the meantime.  No hallucinations.   ? ?Brief memory testing:  ?She is aware of year month and date. ?Can do math.   ?3/3 attention.   ?Knows her address.  ?1/3 short-term recall ? ?She is willing to consider neurocog testing but didn't commit to it now.   ? ?Still with diffuse aches at baseline.  Still on O2 via nasal cannula at baseline. ? ?Meds, vitals, and allergies reviewed.  ? ?ROS: Per HPI unless specifically indicated in ROS section  ? ?GEN: nad, alert and oriented, on O2 via  ?HEENT: ncat ?NECK: supple w/o LA ?CV: rrr.  no murmur ?PULM: ctab, no inc wob ?ABD: soft, +bs ?EXT: no edema ?SKIN: well perfused.   ? ?40 minutes were devoted to patient care in this encounter (this includes time spent reviewing the patient's file/history, interviewing and examining the patient, counseling/reviewing plan with patient).  ? ? ?

## 2021-11-02 NOTE — Patient Instructions (Addendum)
Check your meds at home and let me know if the list doesn't match up or if you are running low.   ?Go to the lab on the way out.   If you have mychart we'll likely use that to update you.    ?Let me know if you are willing to get extra memory testing done.   ?Keep wearing your alert button at home.   ?Take care.  Glad to see you. ?

## 2021-11-03 LAB — CBC WITH DIFFERENTIAL/PLATELET
Basophils Absolute: 0 10*3/uL (ref 0.0–0.1)
Basophils Relative: 1 % (ref 0.0–3.0)
Eosinophils Absolute: 0 10*3/uL (ref 0.0–0.7)
Eosinophils Relative: 0.8 % (ref 0.0–5.0)
HCT: 30.9 % — ABNORMAL LOW (ref 36.0–46.0)
Hemoglobin: 10.1 g/dL — ABNORMAL LOW (ref 12.0–15.0)
Lymphocytes Relative: 19.8 % (ref 12.0–46.0)
Lymphs Abs: 0.9 10*3/uL (ref 0.7–4.0)
MCHC: 32.7 g/dL (ref 30.0–36.0)
MCV: 98 fl (ref 78.0–100.0)
Monocytes Absolute: 0.3 10*3/uL (ref 0.1–1.0)
Monocytes Relative: 7.7 % (ref 3.0–12.0)
Neutro Abs: 3.1 10*3/uL (ref 1.4–7.7)
Neutrophils Relative %: 70.7 % (ref 43.0–77.0)
Platelets: 190 10*3/uL (ref 150.0–400.0)
RBC: 3.15 Mil/uL — ABNORMAL LOW (ref 3.87–5.11)
RDW: 15.1 % (ref 11.5–15.5)
WBC: 4.4 10*3/uL (ref 4.0–10.5)

## 2021-11-03 LAB — COMPREHENSIVE METABOLIC PANEL
ALT: 7 U/L (ref 0–35)
AST: 22 U/L (ref 0–37)
Albumin: 4 g/dL (ref 3.5–5.2)
Alkaline Phosphatase: 40 U/L (ref 39–117)
BUN: 14 mg/dL (ref 6–23)
CO2: 38 mEq/L — ABNORMAL HIGH (ref 19–32)
Calcium: 8.9 mg/dL (ref 8.4–10.5)
Chloride: 100 mEq/L (ref 96–112)
Creatinine, Ser: 0.55 mg/dL (ref 0.40–1.20)
GFR: 81.66 mL/min (ref >60.00)
Glucose, Bld: 110 mg/dL — ABNORMAL HIGH (ref 70–99)
Potassium: 3.6 mEq/L (ref 3.5–5.1)
Sodium: 142 mEq/L (ref 135–145)
Total Bilirubin: 0.4 mg/dL (ref 0.2–1.2)
Total Protein: 6.8 g/dL (ref 6.0–8.3)

## 2021-11-03 LAB — LIPID PANEL
Cholesterol: 125 mg/dL (ref 0–200)
HDL: 52 mg/dL (ref >39.00)
LDL Cholesterol: 60 mg/dL (ref 0–99)
NonHDL: 73.21
Total CHOL/HDL Ratio: 2
Triglycerides: 65 mg/dL (ref 0.0–149.0)
VLDL: 13 mg/dL (ref 0.0–40.0)

## 2021-11-03 LAB — TSH: TSH: 0.29 u[IU]/mL — ABNORMAL LOW (ref 0.35–5.50)

## 2021-11-03 LAB — VITAMIN D 25 HYDROXY (VIT D DEFICIENCY, FRACTURES): VITD: 32.12 ng/mL (ref 30.00–100.00)

## 2021-11-05 NOTE — Assessment & Plan Note (Addendum)
Noted at baseline.  Continues on O2 via nasal cannula.  This complicated her situation when she was found down.  Discussed supervision/help at home.  Her mentation has improved in the meantime, clearly better than when she was inpatient but she did not commit to neurocognitive testing today.  She does have mild deficit on short-term recall with 1 out of 3 correct today.  See above. ? ?We talked about rechecking her routine labs today, having patient and family verify all medications at home, and consider her preferences going forward for extra help at home versus placement. ? ?The plan is to continue her medications listed below upon verification by patient/family. ? ?Current Outpatient Medications on File Prior to Visit  ?Medication Sig Dispense Refill  ?? acetaminophen (TYLENOL) 500 MG tablet Take 1,000 mg by mouth every 6 (six) hours as needed for moderate pain or headache.    ?? albuterol (PROAIR HFA) 108 (90 Base) MCG/ACT inhaler Inhale 1-2 puffs into the lungs every 6 (six) hours as needed for wheezing or shortness of breath (Okay to fill with Ventolin or albuterol HFA). 18 g 3  ?? alendronate (FOSAMAX) 70 MG tablet Take 1 tablet (70 mg total) by mouth every Monday. 4 tablet 1  ?? ALPRAZolam (XANAX) 1 MG tablet TAKE 1/2 TABLET BY MOUTH THREE TIMES DAILY AS NEEDED FOR ANXIETY 45 tablet 0  ?? anastrozole (ARIMIDEX) 1 MG tablet Take 1 tablet (1 mg total) by mouth daily. 90 tablet 4  ?? busPIRone (BUSPAR) 5 MG tablet TAKE ONE TABLET BY MOUTH IN THE MORNING AND IN THE EVENING 60 tablet 0  ?? Calcium Carbonate (CALTRATE 600 PO) Take 600 mg by mouth daily.    ?? cholecalciferol (VITAMIN D) 1000 units tablet Take 1 tablet (1,000 Units total) by mouth daily.    ?? clopidogrel (PLAVIX) 75 MG tablet Take 1 tablet (75 mg total) by mouth daily. Please make overdue appt with Dr. Tamala Julian before anymore refills. Thank you 3rd and Final attempt 15 tablet 0  ?? diclofenac Sodium ([employer] DICLOFENAC SODIUM) 1 % GEL Apply 2 g topically  4 (four) times daily as needed. 100 g 5  ?? DULoxetine (CYMBALTA) 60 MG capsule TAKE ONE CAPSULE BY MOUTH DAILY 90 capsule 1  ?? ferrous sulfate (FERROUSUL) 325 (65 FE) MG tablet Take 1 tablet (325 mg total) by mouth daily with breakfast.    ?? fluticasone (FLONASE) 50 MCG/ACT nasal spray PLACE 2 SPRAYS INTO BOTH NOSTRILS DAILY. 16 g 1  ?? levothyroxine (SYNTHROID) 125 MCG tablet TAKE ONE TABLET BY MOUTH DAILY 90 tablet 3  ?? methocarbamol (ROBAXIN) 500 MG tablet Take 1 tablet (500 mg total) by mouth every 6 (six) hours as needed for muscle spasms. Please contact clinic about follow up with palliative care 40 tablet 1  ?? Multiple Vitamin (MULTIVITAMIN WITH MINERALS) TABS tablet Take 1 tablet by mouth daily.    ?? nitroGLYCERIN (NITROSTAT) 0.4 MG SL tablet Place 1 tablet (0.4 mg total) under the tongue every 5 (five) minutes as needed for chest pain. 25 tablet 6  ?? oxybutynin (DITROPAN) 5 MG tablet Take 1 tablet (5 mg total) by mouth at bedtime as needed for bladder spasms. 90 tablet 1  ?? OXYGEN Inhale 3-3.5 L into the lungs continuous.    ?? pantoprazole (PROTONIX) 20 MG tablet TAKE ONE TABLET BY MOUTH TWICE DAILY 180 tablet 0  ?? rosuvastatin (CRESTOR) 10 MG tablet Take 1 tablet (10 mg total) by mouth daily. 90 tablet 0  ?? SPIRIVA HANDIHALER 18  MCG inhalation capsule INHALE THE CONTENTS OF ONE CAPSULE VIA HANDIHALER DAILY 30 capsule 0  ?? traZODone (DESYREL) 50 MG tablet Take 100-150 mg by mouth at bedtime.    ?? [DISCONTINUED] bisoprolol (ZEBETA) 5 MG tablet Take 0.5 tablets (2.5 mg total) by mouth daily. 30 tablet 0  ? ?No current facility-administered medications on file prior to visit.  ? ? ? ?

## 2021-11-08 ENCOUNTER — Other Ambulatory Visit: Payer: Self-pay | Admitting: Family Medicine

## 2021-11-08 ENCOUNTER — Telehealth: Payer: Self-pay | Admitting: Family Medicine

## 2021-11-08 DIAGNOSIS — D649 Anemia, unspecified: Secondary | ICD-10-CM

## 2021-11-08 DIAGNOSIS — E039 Hypothyroidism, unspecified: Secondary | ICD-10-CM

## 2021-11-08 MED ORDER — LEVOTHYROXINE SODIUM 112 MCG PO TABS: ORAL_TABLET | ORAL | 1 refills | Status: DC

## 2021-11-08 NOTE — Telephone Encounter (Signed)
Pt's daughter Tresa Res) called about a missed call from the nurse, thinks it is for pt's blood work results. Has some questions about it.  ? ?Callback Number: [phone] ?

## 2021-11-08 NOTE — Telephone Encounter (Signed)
Spoke with [name] and discussed patients meds with her. See results note.  ?

## 2021-11-13 DIAGNOSIS — M545 Low back pain, unspecified: Secondary | ICD-10-CM | POA: Diagnosis not present

## 2021-11-13 DIAGNOSIS — M25552 Pain in left hip: Secondary | ICD-10-CM | POA: Diagnosis not present

## 2021-11-16 ENCOUNTER — Other Ambulatory Visit: Payer: Self-pay | Admitting: Family Medicine

## 2021-11-17 NOTE — Telephone Encounter (Signed)
Refill request for Alprazolam 1 mg tab ? ?LOV - 11/02/21 ?Next OV - not scheduled ?Last refill - 10/24/21 #45/0 ? ?Also levothyroxine needs to be refused as that was done last week ? ?

## 2021-11-24 ENCOUNTER — Telehealth: Payer: Self-pay | Admitting: Family Medicine

## 2021-11-24 NOTE — Telephone Encounter (Signed)
Left message for patient to call back and schedule Medicare Annual Wellness Visit (AWV) either virtually or phone   Last AWV ;03/24/18  please schedule at anytime with health coach  My direct # [phone]

## 2021-11-27 ENCOUNTER — Telehealth: Payer: Self-pay

## 2021-11-27 NOTE — Telephone Encounter (Signed)
[name], patient's daughter, called stating that patient was getting Donepezil 5 mg at [facility] and has ran out of this medication and [name] would like to see if Dr Damita Dunnings can refill this for the patient. Patient's memory is not good. Uses [facility] in the chart.

## 2021-11-28 MED ORDER — DONEPEZIL HCL 5 MG PO TABS
5.0000 mg | ORAL_TABLET | Freq: Every day | ORAL | 0 refills | Status: DC
Start: 2021-11-28 — End: 2022-02-16

## 2021-11-28 NOTE — Telephone Encounter (Signed)
Spoke with patients daughter [name] and notified rx was sent. OV has been set for 02/20/22 at 2:00 pm.

## 2021-11-28 NOTE — Telephone Encounter (Signed)
Sent. Please schedule III month follow-up so we can recheck her memory.  Thanks.

## 2021-12-05 ENCOUNTER — Other Ambulatory Visit: Payer: Self-pay

## 2021-12-06 ENCOUNTER — Encounter (HOSPITAL_COMMUNITY): Payer: Self-pay | Admitting: Emergency Medicine

## 2021-12-06 ENCOUNTER — Inpatient Hospital Stay (HOSPITAL_COMMUNITY)
Admission: EM | Admit: 2021-12-06 | Discharge: 2021-12-13 | DRG: 190 | Disposition: A | Discharge status: Skilled Nursing Facility | Payer: Medicare PPO | Attending: Internal Medicine | Admitting: Internal Medicine

## 2021-12-06 ENCOUNTER — Other Ambulatory Visit: Payer: Self-pay

## 2021-12-06 ENCOUNTER — Emergency Department (HOSPITAL_COMMUNITY): Payer: Medicare PPO

## 2021-12-06 DIAGNOSIS — F419 Anxiety disorder, unspecified: Secondary | ICD-10-CM | POA: Diagnosis not present

## 2021-12-06 DIAGNOSIS — S199XXA Unspecified injury of neck, initial encounter: Secondary | ICD-10-CM | POA: Diagnosis not present

## 2021-12-06 DIAGNOSIS — J9621 Acute and chronic respiratory failure with hypoxia: Secondary | ICD-10-CM | POA: Diagnosis not present

## 2021-12-06 DIAGNOSIS — Z79890 Hormone replacement therapy (postmenopausal): Secondary | ICD-10-CM

## 2021-12-06 DIAGNOSIS — Z87891 Personal history of nicotine dependence: Secondary | ICD-10-CM | POA: Diagnosis not present

## 2021-12-06 DIAGNOSIS — E876 Hypokalemia: Secondary | ICD-10-CM | POA: Diagnosis present

## 2021-12-06 DIAGNOSIS — W1830XA Fall on same level, unspecified, initial encounter: Secondary | ICD-10-CM | POA: Diagnosis present

## 2021-12-06 DIAGNOSIS — I712 Thoracic aortic aneurysm, without rupture, unspecified: Secondary | ICD-10-CM | POA: Diagnosis not present

## 2021-12-06 DIAGNOSIS — E559 Vitamin D deficiency, unspecified: Secondary | ICD-10-CM | POA: Diagnosis present

## 2021-12-06 DIAGNOSIS — E785 Hyperlipidemia, unspecified: Secondary | ICD-10-CM | POA: Diagnosis present

## 2021-12-06 DIAGNOSIS — R531 Weakness: Secondary | ICD-10-CM

## 2021-12-06 DIAGNOSIS — D6959 Other secondary thrombocytopenia: Secondary | ICD-10-CM | POA: Diagnosis present

## 2021-12-06 DIAGNOSIS — I7143 Infrarenal abdominal aortic aneurysm, without rupture: Secondary | ICD-10-CM | POA: Diagnosis not present

## 2021-12-06 DIAGNOSIS — R1312 Dysphagia, oropharyngeal phase: Secondary | ICD-10-CM | POA: Diagnosis not present

## 2021-12-06 DIAGNOSIS — D259 Leiomyoma of uterus, unspecified: Secondary | ICD-10-CM | POA: Diagnosis not present

## 2021-12-06 DIAGNOSIS — I714 Abdominal aortic aneurysm, without rupture, unspecified: Secondary | ICD-10-CM

## 2021-12-06 DIAGNOSIS — I959 Hypotension, unspecified: Secondary | ICD-10-CM | POA: Diagnosis not present

## 2021-12-06 DIAGNOSIS — M797 Fibromyalgia: Secondary | ICD-10-CM | POA: Diagnosis present

## 2021-12-06 DIAGNOSIS — J441 Chronic obstructive pulmonary disease with (acute) exacerbation: Principal | ICD-10-CM | POA: Diagnosis present

## 2021-12-06 DIAGNOSIS — I2510 Atherosclerotic heart disease of native coronary artery without angina pectoris: Secondary | ICD-10-CM | POA: Diagnosis not present

## 2021-12-06 DIAGNOSIS — F411 Generalized anxiety disorder: Secondary | ICD-10-CM | POA: Diagnosis present

## 2021-12-06 DIAGNOSIS — R41841 Cognitive communication deficit: Secondary | ICD-10-CM | POA: Diagnosis not present

## 2021-12-06 DIAGNOSIS — Z7902 Long term (current) use of antithrombotics/antiplatelets: Secondary | ICD-10-CM | POA: Diagnosis not present

## 2021-12-06 DIAGNOSIS — M6281 Muscle weakness (generalized): Secondary | ICD-10-CM | POA: Diagnosis not present

## 2021-12-06 DIAGNOSIS — Z7983 Long term (current) use of bisphosphonates: Secondary | ICD-10-CM

## 2021-12-06 DIAGNOSIS — C50919 Malignant neoplasm of unspecified site of unspecified female breast: Secondary | ICD-10-CM | POA: Diagnosis present

## 2021-12-06 DIAGNOSIS — J9622 Acute and chronic respiratory failure with hypercapnia: Secondary | ICD-10-CM

## 2021-12-06 DIAGNOSIS — Z853 Personal history of malignant neoplasm of breast: Secondary | ICD-10-CM

## 2021-12-06 DIAGNOSIS — K219 Gastro-esophageal reflux disease without esophagitis: Secondary | ICD-10-CM | POA: Diagnosis present

## 2021-12-06 DIAGNOSIS — D638 Anemia in other chronic diseases classified elsewhere: Secondary | ICD-10-CM | POA: Diagnosis present

## 2021-12-06 DIAGNOSIS — S0990XA Unspecified injury of head, initial encounter: Secondary | ICD-10-CM | POA: Diagnosis not present

## 2021-12-06 DIAGNOSIS — I5022 Chronic systolic (congestive) heart failure: Secondary | ICD-10-CM | POA: Diagnosis not present

## 2021-12-06 DIAGNOSIS — Z96651 Presence of right artificial knee joint: Secondary | ICD-10-CM | POA: Diagnosis present

## 2021-12-06 DIAGNOSIS — Z7951 Long term (current) use of inhaled steroids: Secondary | ICD-10-CM

## 2021-12-06 DIAGNOSIS — F0390 Unspecified dementia without behavioral disturbance: Secondary | ICD-10-CM | POA: Diagnosis present

## 2021-12-06 DIAGNOSIS — R1310 Dysphagia, unspecified: Secondary | ICD-10-CM | POA: Diagnosis not present

## 2021-12-06 DIAGNOSIS — W19XXXA Unspecified fall, initial encounter: Secondary | ICD-10-CM | POA: Diagnosis not present

## 2021-12-06 DIAGNOSIS — G9341 Metabolic encephalopathy: Secondary | ICD-10-CM | POA: Diagnosis not present

## 2021-12-06 DIAGNOSIS — Z79899 Other long term (current) drug therapy: Secondary | ICD-10-CM

## 2021-12-06 DIAGNOSIS — Z7401 Bed confinement status: Secondary | ICD-10-CM | POA: Diagnosis not present

## 2021-12-06 DIAGNOSIS — Z20822 Contact with and (suspected) exposure to covid-19: Secondary | ICD-10-CM | POA: Diagnosis present

## 2021-12-06 DIAGNOSIS — E039 Hypothyroidism, unspecified: Secondary | ICD-10-CM | POA: Diagnosis present

## 2021-12-06 DIAGNOSIS — Z9981 Dependence on supplemental oxygen: Secondary | ICD-10-CM

## 2021-12-06 DIAGNOSIS — D649 Anemia, unspecified: Secondary | ICD-10-CM | POA: Diagnosis present

## 2021-12-06 DIAGNOSIS — Z79811 Long term (current) use of aromatase inhibitors: Secondary | ICD-10-CM | POA: Diagnosis not present

## 2021-12-06 DIAGNOSIS — Z743 Need for continuous supervision: Secondary | ICD-10-CM | POA: Diagnosis not present

## 2021-12-06 DIAGNOSIS — R102 Pelvic and perineal pain: Secondary | ICD-10-CM | POA: Diagnosis not present

## 2021-12-06 DIAGNOSIS — J9690 Respiratory failure, unspecified, unspecified whether with hypoxia or hypercapnia: Secondary | ICD-10-CM | POA: Diagnosis not present

## 2021-12-06 DIAGNOSIS — R2681 Unsteadiness on feet: Secondary | ICD-10-CM | POA: Diagnosis not present

## 2021-12-06 DIAGNOSIS — Z66 Do not resuscitate: Secondary | ICD-10-CM | POA: Diagnosis not present

## 2021-12-06 DIAGNOSIS — Z043 Encounter for examination and observation following other accident: Secondary | ICD-10-CM | POA: Diagnosis not present

## 2021-12-06 DIAGNOSIS — M79604 Pain in right leg: Secondary | ICD-10-CM | POA: Diagnosis not present

## 2021-12-06 DIAGNOSIS — Z96611 Presence of right artificial shoulder joint: Secondary | ICD-10-CM | POA: Diagnosis present

## 2021-12-06 DIAGNOSIS — Z8673 Personal history of transient ischemic attack (TIA), and cerebral infarction without residual deficits: Secondary | ICD-10-CM

## 2021-12-06 LAB — BLOOD GAS, VENOUS
Acid-Base Excess: 12.5 mmol/L — ABNORMAL HIGH (ref 0.0–2.0)
Acid-Base Excess: 12.4 mmol/L — ABNORMAL HIGH (ref 0.0–2.0)
Bicarbonate: 42.2 mmol/L — ABNORMAL HIGH (ref 20.0–28.0)
Bicarbonate: 41.2 mmol/L — ABNORMAL HIGH (ref 20.0–28.0)
O2 Saturation: 48.5 %
O2 Saturation: 50.3 %
Patient temperature: 37
Patient temperature: 36.8
pCO2, Ven: 80 mmHg (ref 44–60)
pCO2, Ven: 72 mmHg (ref 44–60)
pH, Ven: 7.33 (ref 7.25–7.43)
pH, Ven: 7.36 (ref 7.25–7.43)
pO2, Ven: 31 mmHg — CL (ref 32–45)
pO2, Ven: <31 mmHg — CL (ref 32–45)

## 2021-12-06 LAB — COMPREHENSIVE METABOLIC PANEL
ALT: 11 U/L (ref 0–44)
AST: 22 U/L (ref 15–41)
Albumin: 3.5 g/dL (ref 3.5–5.0)
Alkaline Phosphatase: 36 U/L — ABNORMAL LOW (ref 38–126)
Anion gap: 6 (ref 5–15)
BUN: 19 mg/dL (ref 8–23)
CO2: 37 mmol/L — ABNORMAL HIGH (ref 22–32)
Calcium: 9.2 mg/dL (ref 8.9–10.3)
Chloride: 101 mmol/L (ref 98–111)
Creatinine, Ser: 0.53 mg/dL (ref 0.44–1.00)
GFR, Estimated: >60 mL/min (ref >60)
Glucose, Bld: 98 mg/dL (ref 70–99)
Potassium: 3.7 mmol/L (ref 3.5–5.1)
Sodium: 144 mmol/L (ref 135–145)
Total Bilirubin: 1 mg/dL (ref 0.3–1.2)
Total Protein: 6.7 g/dL (ref 6.5–8.1)

## 2021-12-06 LAB — CBC WITH DIFFERENTIAL/PLATELET
Abs Immature Granulocytes: 0 10*3/uL (ref 0.00–0.07)
Basophils Absolute: 0 10*3/uL (ref 0.0–0.1)
Basophils Relative: 1 %
Eosinophils Absolute: 0.1 10*3/uL (ref 0.0–0.5)
Eosinophils Relative: 1 %
HCT: 32.8 % — ABNORMAL LOW (ref 36.0–46.0)
Hemoglobin: 10 g/dL — ABNORMAL LOW (ref 12.0–15.0)
Immature Granulocytes: 0 %
Lymphocytes Relative: 21 %
Lymphs Abs: 1 10*3/uL (ref 0.7–4.0)
MCH: 32.2 pg (ref 26.0–34.0)
MCHC: 30.5 g/dL (ref 30.0–36.0)
MCV: 105.5 fL — ABNORMAL HIGH (ref 80.0–100.0)
Monocytes Absolute: 0.4 10*3/uL (ref 0.1–1.0)
Monocytes Relative: 8 %
Neutro Abs: 3.3 10*3/uL (ref 1.7–7.7)
Neutrophils Relative %: 69 %
Platelets: 131 10*3/uL — ABNORMAL LOW (ref 150–400)
RBC: 3.11 MIL/uL — ABNORMAL LOW (ref 3.87–5.11)
RDW: 13.3 % (ref 11.5–15.5)
WBC: 4.7 10*3/uL (ref 4.0–10.5)
nRBC: 0 % (ref 0.0–0.2)

## 2021-12-06 LAB — URINALYSIS, ROUTINE W REFLEX MICROSCOPIC
Bilirubin Urine: NEGATIVE
Glucose, UA: NEGATIVE mg/dL
Hgb urine dipstick: NEGATIVE
Ketones, ur: 5 mg/dL — AB
Leukocytes,Ua: NEGATIVE
Nitrite: NEGATIVE
Protein, ur: NEGATIVE mg/dL
Specific Gravity, Urine: >1.046 — ABNORMAL HIGH (ref 1.005–1.030)
pH: 5 (ref 5.0–8.0)

## 2021-12-06 LAB — SARS CORONAVIRUS 2 BY RT PCR: SARS Coronavirus 2 by RT PCR: NEGATIVE

## 2021-12-06 LAB — RAPID URINE DRUG SCREEN, HOSP PERFORMED
Amphetamines: NOT DETECTED
Barbiturates: NOT DETECTED
Benzodiazepines: POSITIVE — AB
Cocaine: NOT DETECTED
Opiates: NOT DETECTED
Tetrahydrocannabinol: NOT DETECTED

## 2021-12-06 LAB — TSH: TSH: 0.067 u[IU]/mL — ABNORMAL LOW (ref 0.350–4.500)

## 2021-12-06 LAB — FOLATE: Folate: 22.3 ng/mL (ref >5.9)

## 2021-12-06 LAB — TROPONIN I (HIGH SENSITIVITY)
Troponin I (High Sensitivity): 14 ng/L (ref <18)
Troponin I (High Sensitivity): 12 ng/L (ref <18)

## 2021-12-06 LAB — MRSA NEXT GEN BY PCR, NASAL: MRSA by PCR Next Gen: NOT DETECTED

## 2021-12-06 LAB — CK: Total CK: 17 U/L — ABNORMAL LOW (ref 38–234)

## 2021-12-06 LAB — BRAIN NATRIURETIC PEPTIDE: B Natriuretic Peptide: 110.6 pg/mL — ABNORMAL HIGH (ref 0.0–100.0)

## 2021-12-06 LAB — MAGNESIUM: Magnesium: 2.4 mg/dL (ref 1.7–2.4)

## 2021-12-06 MED ORDER — SODIUM CHLORIDE 0.9 % IV SOLN
500.0000 mg | INTRAVENOUS | Status: DC
  Administered 2021-12-06 – 2021-12-07 (×2): 500 mg via INTRAVENOUS
  Filled 2021-12-06 (×2): qty 5

## 2021-12-06 MED ORDER — FENTANYL CITRATE (PF) 100 MCG/2ML IJ SOLN
50.0000 ug | INTRAMUSCULAR | Status: DC | PRN
  Administered 2021-12-06 – 2021-12-09 (×8): 50 ug via INTRAVENOUS
  Filled 2021-12-06 (×8): qty 2

## 2021-12-06 MED ORDER — METHYLPREDNISOLONE SODIUM SUCC 125 MG IJ SOLR
125.0000 mg | Freq: Once | INTRAMUSCULAR | Status: AC
  Administered 2021-12-06: 125 mg via INTRAVENOUS
  Filled 2021-12-06: qty 2

## 2021-12-06 MED ORDER — ALBUTEROL SULFATE (2.5 MG/3ML) 0.083% IN NEBU
5.0000 mg | INHALATION_SOLUTION | Freq: Once | RESPIRATORY_TRACT | Status: AC
  Administered 2021-12-06: 5 mg via RESPIRATORY_TRACT
  Filled 2021-12-06: qty 6

## 2021-12-06 MED ORDER — IPRATROPIUM BROMIDE 0.02 % IN SOLN
0.5000 mg | Freq: Once | RESPIRATORY_TRACT | Status: AC
  Administered 2021-12-06: 0.5 mg via RESPIRATORY_TRACT
  Filled 2021-12-06: qty 2.5

## 2021-12-06 MED ORDER — METHYLPREDNISOLONE SODIUM SUCC 125 MG IJ SOLR
80.0000 mg | Freq: Two times a day (BID) | INTRAMUSCULAR | Status: DC
  Administered 2021-12-07 (×2): 80 mg via INTRAVENOUS
  Filled 2021-12-06 (×2): qty 2

## 2021-12-06 MED ORDER — CLOPIDOGREL BISULFATE 75 MG PO TABS
75.0000 mg | ORAL_TABLET | Freq: Every day | ORAL | Status: DC
  Administered 2021-12-07 – 2021-12-11 (×5): 75 mg via ORAL
  Filled 2021-12-06 (×5): qty 1

## 2021-12-06 MED ORDER — ALBUTEROL SULFATE (2.5 MG/3ML) 0.083% IN NEBU: 2.5000 mg | INHALATION_SOLUTION | RESPIRATORY_TRACT | Status: DC | PRN

## 2021-12-06 MED ORDER — IOHEXOL 300 MG/ML  SOLN: 100.0000 mL | Freq: Once | INTRAMUSCULAR | Status: DC | PRN

## 2021-12-06 MED ORDER — ANASTROZOLE 1 MG PO TABS
1.0000 mg | ORAL_TABLET | Freq: Every day | ORAL | Status: DC
  Administered 2021-12-07 – 2021-12-13 (×7): 1 mg via ORAL
  Filled 2021-12-06 (×7): qty 1

## 2021-12-06 MED ORDER — IOHEXOL 350 MG/ML SOLN
100.0000 mL | Freq: Once | INTRAVENOUS | Status: AC | PRN
  Administered 2021-12-06: 100 mL via INTRAVENOUS

## 2021-12-06 MED ORDER — FERROUS SULFATE 325 (65 FE) MG PO TABS
325.0000 mg | ORAL_TABLET | Freq: Every day | ORAL | Status: DC
  Administered 2021-12-07 – 2021-12-13 (×7): 325 mg via ORAL
  Filled 2021-12-06 (×7): qty 1

## 2021-12-06 MED ORDER — ACETAMINOPHEN 325 MG PO TABS
650.0000 mg | ORAL_TABLET | Freq: Four times a day (QID) | ORAL | Status: DC | PRN
  Administered 2021-12-07 – 2021-12-13 (×10): 650 mg via ORAL
  Filled 2021-12-06 (×13): qty 2

## 2021-12-06 MED ORDER — FLUTICASONE PROPIONATE 50 MCG/ACT NA SUSP: 2.0000 | Freq: Every day | NASAL | Status: DC | PRN

## 2021-12-06 MED ORDER — NALOXONE HCL 0.4 MG/ML IJ SOLN: 0.4000 mg | INTRAMUSCULAR | Status: DC | PRN

## 2021-12-06 MED ORDER — ORAL CARE MOUTH RINSE
15.0000 mL | Freq: Two times a day (BID) | OROMUCOSAL | Status: DC
  Administered 2021-12-07 – 2021-12-11 (×9): 15 mL via OROMUCOSAL

## 2021-12-06 MED ORDER — IPRATROPIUM-ALBUTEROL 0.5-2.5 (3) MG/3ML IN SOLN: 3.0000 mL | Freq: Four times a day (QID) | RESPIRATORY_TRACT | Status: DC

## 2021-12-06 MED ORDER — LEVOTHYROXINE SODIUM 112 MCG PO TABS
112.0000 ug | ORAL_TABLET | Freq: Every day | ORAL | Status: DC
  Administered 2021-12-07 – 2021-12-13 (×7): 112 ug via ORAL
  Filled 2021-12-06 (×7): qty 1

## 2021-12-06 MED ORDER — TRAZODONE HCL 50 MG PO TABS
150.0000 mg | ORAL_TABLET | Freq: Every evening | ORAL | Status: DC | PRN
  Administered 2021-12-06 – 2021-12-12 (×7): 150 mg via ORAL
  Filled 2021-12-06: qty 1
  Filled 2021-12-06: qty 3
  Filled 2021-12-06 (×3): qty 1
  Filled 2021-12-06: qty 3
  Filled 2021-12-06: qty 1
  Filled 2021-12-06: qty 3

## 2021-12-06 MED ORDER — CHLORHEXIDINE GLUCONATE CLOTH 2 % EX PADS
6.0000 | MEDICATED_PAD | Freq: Every day | CUTANEOUS | Status: DC
  Administered 2021-12-07 – 2021-12-10 (×3): 6 via TOPICAL

## 2021-12-06 MED ORDER — DONEPEZIL HCL 10 MG PO TABS
5.0000 mg | ORAL_TABLET | Freq: Every day | ORAL | Status: DC
  Administered 2021-12-06 – 2021-12-12 (×6): 5 mg via ORAL
  Filled 2021-12-06 (×3): qty 1
  Filled 2021-12-06: qty 0.5
  Filled 2021-12-06 (×2): qty 1

## 2021-12-06 MED ORDER — CHLORHEXIDINE GLUCONATE 0.12 % MT SOLN
15.0000 mL | Freq: Two times a day (BID) | OROMUCOSAL | Status: DC
  Administered 2021-12-06 – 2021-12-07 (×3): 15 mL via OROMUCOSAL
  Filled 2021-12-06: qty 15

## 2021-12-06 MED ORDER — IPRATROPIUM-ALBUTEROL 0.5-2.5 (3) MG/3ML IN SOLN
3.0000 mL | Freq: Three times a day (TID) | RESPIRATORY_TRACT | Status: DC
  Administered 2021-12-07: 3 mL via RESPIRATORY_TRACT
  Filled 2021-12-06: qty 3

## 2021-12-06 MED ORDER — ACETAMINOPHEN 650 MG RE SUPP: 650.0000 mg | Freq: Four times a day (QID) | RECTAL | Status: DC | PRN

## 2021-12-06 NOTE — Progress Notes (Signed)
Pt transported from room WA10 to room 1228.  Pt remained stable and comfortable throughout the trip.  Report given to ICU RT.

## 2021-12-06 NOTE — H&P (Addendum)
History and Physical    PLEASE NOTE THAT DRAGON DICTATION SOFTWARE WAS USED IN THE CONSTRUCTION OF THIS NOTE.   [name] MEQ:[id number] DOB: 01/07/1931 DOA: 12/06/2021  PCP: Alice Ghent, MD  Patient coming from: home   I have personally briefly reviewed patient's old medical records in [place]  Chief Complaint: sob  HPI: [name] is a 88 y.o. female with medical history significant for chronic hypoxic respiratory failure on 3 L continuous nasal cannula, severe COPD, TIAs, chronic systolic heart failure with most recent echocardiogram in July 2020 notable for LVEF 40 to 45%, allergic rhinitis, chronic back pain, chronic anemia associated baseline hemoglobin 9.5-11, who is admitted to [facility] on 12/06/2021 with suspected acute COPD exacerbation after presenting from home to [facility] ED complaining of shortness of breath.   The following history is provided by the patient as well as my discussions with the patient's daughter, [name], who is present at bedside, in addition to my discussions with the EDP and via chart review.  The patient reports 2 days of progressive shortness of breath in the absence of any associated orthopnea, PND, or worsening edema in the bilateral lower extremities.  She also denies any associated chest pain, diaphoresis, palpitations, nausea, vomiting, presyncope, syncope.  She notes that the shortness of breath has been associated with new onset mild, nonproductive cough, in the absence of any associated hemoptysis.  Denies any associated subjective fever, chills, rigors, or generalized myalgias.  No associated any new onset calf tenderness or new lower extremity erythema.  She has a history of severe COPD as well as chronic hypoxic respiratory failure on 3 L continuous nasal cannula, with which the patient reports good compliance.  She also notes compliance on room respiratory regimen which consists of scheduled Spiriva as well as  as needed albuterol.  She notes progressive shortness of breath over the last 1 to 2 days, in spite of increased frequency of use of prn albuterol inhaler.  She confirms a history of allergic rhinitis, in the context of seasonal allergies, and notes recent worsening of her rhinitis/rhinorrhea over the course the last several days, in spite of scheduled use of intranasal Flonase at home.  Over the last day, she is also noted generalized weakness, In the absence of any associated acute focal weakness, acute focal numbness, paresthesias, facial droop, slurred speech, expressive aphasia, acute change in vision, dysphagia, vertigo.  In the setting of this generalized weakness, she conveys that she fell while attempting to ambulate at home, as consequence of tripping, she notes that she landed on her right side, including her right hip as the principal point of contact with the floor below.  She denies hitting her head as a component of this fall.  Is on Plavix in the context of a history of multiple prior TIAs.  No associated loss of consciousness.  Denies any significant residual right hip pain, nor any additional acute arthralgias or myalgias stemming from this fall.  She notes chronic back pain, which she states is not significantly more severe relative to baseline.  Denies any new abdominal discomfort.  Denies any recent dysuria, gross hematuria, or change in urinary urgency/frequency.  No headache, neck pain, or neck stiffness.  No recent rash, diarrhea.   Per chart review, the patient has a history of chronic systolic heart failure, with most recent echocardiogram in July 2020 notable for LVEF 40 to 45%, normal right ventricular systolic function and mild aortic regurgitation.  She  is not on any scheduled diuretic medications as an outpatient.  Per my discussions with the patient's daughter, is present bedside, daughter felt that the patient was mildly confused relative to baseline mental status when she  spoke with the patient on the morning of 12/06/2021.  After period of time on BiPAP in the emergency department this evening, the patient's daughter conveys that the patient's mental status has improved, and is now at baseline.     ED Course:  Vital signs in the ED were notable for the following: Afebrile; heart rate 60-73; blood pressure 140/70; respiratory rate 16-22, oxygen saturation 99 to 100% on baseline 3 L nasal cannula; following to initiation of BiPAP with 30% FiO2, O2 sats noted to be 100%.  Labs were notable for the following: VBG checked prior to initiation of BiPAP notable for the following: 7.33/80.  CMP notable for the following: Sodium 144, bicarbonate 37, creatinine 0.53, glucose 98, liver enzymes within normal limits.  CBC notable for episode count 4700, hemoglobin 10 associated with microcytic findings as well as normocytic findings and nonelevated RDW, relative to most recent prior hemoglobin data point of 10.1 on 11/02/2021, platelet count 131.  Urinalysis has been ordered, with result currently pending.  BNP 110 compared to most recent prior value 167 and some 2021, CPK 17.  High-sensitivity troponin I I initially noted to be 14, with repeat value trending down to 12.  Imaging and additional notable ED work-up: EKG shows sinus rhythm with heart rate 60, normal intervals, including QTc of 463, and no evidence of T wave or ST changes, including no evidence of ST elevation.  Noncontrast CT head showed no evidence of acute intracranial process, including no evidence of intracranial hemorrhage or any evidence of acute infarct; CT cervical spine showed no evidence of acute cervical fracture or any evidence of subluxation injury.  Plain films of the pelvis, 1 view, showed no evidence of acute pelvic fracture.  Plain films of the right femur showed no evidence of acute femur fracture.  Chest x-ray showed no evidence of acute cardiopulmonary process.  CTA chest, abdomen, and pelvis with  dissection protocol, in comparison to most recent prior corresponding scan from 09/14/2019, showed unchanged plaque within the aortic arch, also showing 5 cm infrarenal abdominal aortic aneurysm without evidence of dissection or rupture, as well as 4.3 cm ascending thoracic aortic aneurysm, also showing no evidence of dissection or rupture, with associated radiology recommendation for follow-up imaging in 6 months.  Otherwise, CTA chest, abdomen, pelvis showed no evidence of acute intrathoracic, intra-abdominal, or intrapelvic process, including no evidence of infiltrate, edema, effusion, or pneumothorax.  In the setting of the patient's presenting shortness of breath with increased respiratory rate, as well as CT a showing evidence of a sending thoracic aortic aneurysm as well as abdominal aortic aneurysm, EDP discussed patient's case with the on-call vascular surgeon, Dr. Trula Slade, Who after reviewing imaging, agreed that presentation is not consistent with dissection or rupture, and  recommended outpatient referral to vascular surgery following discharge.   While in the ED, the following were administered: Duo nebulizer treatment x1, Solu-Medrol 125 mg IV x1.  Subsequently, the patient was admitted for further evaluation and management of suspected acute COPD exacerbation with presentation also notable for generalized weakness as well as ground-level mechanical fall earlier in the day.     Review of Systems: As per HPI otherwise 10 point review of systems negative.   Past Medical History:  Diagnosis Date   Abdominal pain 04/05/2017  Allergic rhinitis, cause unspecified    Allergy, unspecified not elsewhere classified    Anemia, unspecified 04/10/2008   Qualifier: Diagnosis of  By: Linna Darner MD, William     Anxiety    Anxiety state 03/01/2008   Qualifier: Diagnosis of  By: Council Mechanic MD, Hilaria Ota    Asthma    Breast cancer ([place]) 08/2018   right breast   Breast mass 08/28/2018   Chronic kidney  disease    frequency   COMMON MIGRAINE 12/24/2006   Qualifier: Diagnosis of  By: Fuller Plan CMA (AAMA), Lugene     COPD (chronic obstructive pulmonary disease) ([place])    Degeneration of intervertebral disc, site unspecified    Diverticulosis of colon (without mention of hemorrhage)    DNR (do not resuscitate) 09/08/2019   Dysphagia 02/18/2019   Encounter for chronic pain management 07/26/2016   Indication for chronic opioid: chronic back pain Medication and dose:  Oxycodone '10mg'$  TID # pills per month: 90 Last UDS date: 04/15/15 Pain contract signed (Y/N):  yes Date narcotic database last reviewed (include red flags):  05/06/17   Esophageal reflux    Fibromyalgia 12/24/2006   Qualifier: Diagnosis of  By: Fuller Plan CMA (AAMA), Lugene     Headache    HTN (hypertension) 06/09/2013   Hyperlipidemia with target LDL less than 70 12/24/2006   Qualifier: Diagnosis of  By: Fuller Plan CMA ([place]), Lugene     Hypokalemia 05/24/2009   Qualifier: Diagnosis of  By: Lacretia Nicks     Hypothyroidism 12/24/2006   Qualifier: Diagnosis of  By: Fuller Plan CMA (AAMA), Lugene     Insomnia 12/24/2006   she had trials of antihistamines and TCAs prev w/o effect.      Insomnia, unspecified    Lower GI bleeding 09/09/2019   Myalgia and myositis, unspecified    Need for prophylactic hormone replacement therapy (postmenopausal)    Osteoarthritis of right knee 08/27/2011   R knee pain, s/p arthroscopy 2012 per [name]    Osteoporosis 06/23/2007   Qualifier: Diagnosis of  By: Council Mechanic MD, Hilaria Ota    Osteoporosis, unspecified    Other abnormal blood chemistry    Other and unspecified hyperlipidemia    Other chronic pain    Presence of drug coated stent in right coronary artery 01/30/2019   Stroke [facility])    mini strokes   Syncope 01/24/2017   Thoracic aortic aneurysm ([place]) 04/10/2017   Unspecified essential hypertension    Unspecified hypothyroidism    Vitamin D deficiency 09/26/2016    Past Surgical History:  Procedure  Laterality Date   BACK SURGERY     breast cystectomy     CORONARY/GRAFT ACUTE MI REVASCULARIZATION N/A 01/29/2019   Procedure: CORONARY/GRAFT ACUTE MI REVASCULARIZATION;  Surgeon: Belva Crome, MD;  Location: [place] [facility];  Service: Cardiovascular;  Laterality: N/A;   JOINT REPLACEMENT     shoulder right   KNEE ARTHROSCOPY     Right knee 2012   MASS EXCISION Left 04/07/2015   Procedure: MINOR EXCISION OF MASS LEFT SMALL FINGER;  Surgeon: Leanora Cover, MD;  Location: [place];  Service: Orthopedics;  Laterality: Left;   SHOULDER SURGERY     TOTAL KNEE ARTHROPLASTY  12/11/2011   Procedure: TOTAL KNEE ARTHROPLASTY;  Surgeon: Johnny Bridge, MD;  Location: [place];  Service: Orthopedics;  Laterality: Right;   UMBILICAL HERNIA REPAIR N/A 06/09/2013   Procedure: EXPLORATIORY LAPAROTOMY, HERNIA REPAIR UMBILICAL, INSERTION OF MESH;  Surgeon: Haywood Lasso, MD;  Location: [place];  Service: General;  Laterality: N/A;    Social History:  reports that she quit smoking about 36 years ago. Her smoking use included cigarettes. She has a 15.00 pack-year smoking history. She has never used smokeless tobacco. She reports that she does not drink alcohol and does not use drugs.   Allergies  Allergen Reactions   Doxycycline Other (See Comments)    GI upset   Sulfadiazine Other (See Comments)    REACTION: Fever, aches   Sulfa Antibiotics Rash    Family History  Problem Relation Age of Onset   Lung cancer Brother        x2   Hypertension Father    Heart disease Father    Heart disease Mother    Asthma Sister     Family history reviewed and not pertinent    Prior to Admission medications   Medication Sig Start Date End Date Taking? Authorizing Provider  acetaminophen (TYLENOL) 500 MG tablet Take 1,000 mg by mouth every 6 (six) hours as needed for moderate pain or headache.    [provider]  albuterol (PROAIR HFA) 108 (90 Base) MCG/ACT inhaler Inhale 1-2 puffs  into the lungs every 6 (six) hours as needed for wheezing or shortness of breath (Okay to fill with Ventolin or albuterol HFA). 10/30/19   Alice Ghent, MD  alendronate (FOSAMAX) 70 MG tablet Take 1 tablet (70 mg total) by mouth every Monday. 10/02/21   Benay Pike, MD  ALPRAZolam Duanne Moron) 1 MG tablet TAKE 1/2 TABLET BY MOUTH THREE TIMES DAILY AS NEEDED FOR ANXIETY 11/19/21   Alice Ghent, MD  anastrozole (ARIMIDEX) 1 MG tablet Take 1 tablet (1 mg total) by mouth daily. 10/05/20   Magrinat, Virgie Dad, MD  busPIRone (BUSPAR) 5 MG tablet TAKE ONE TABLET BY MOUTH IN THE MORNING AND IN THE EVENING 10/23/21   Alice Ghent, MD  Calcium Carbonate (CALTRATE 600 PO) Take 600 mg by mouth daily.    [provider]  cholecalciferol (VITAMIN D) 1000 units tablet Take 1 tablet (1,000 Units total) by mouth daily. 09/25/16   Alice Ghent, MD  clopidogrel (PLAVIX) 75 MG tablet Take 1 tablet (75 mg total) by mouth daily. Please make overdue appt with Dr. Tamala Julian before anymore refills. Thank you 3rd and Final attempt 05/18/21   Belva Crome, MD  diclofenac Sodium ([employer] DICLOFENAC SODIUM) 1 % GEL Apply 2 g topically 4 (four) times daily as needed. 11/16/20   Alice Ghent, MD  donepezil (ARICEPT) 5 MG tablet Take 1 tablet (5 mg total) by mouth at bedtime. 11/28/21   Alice Ghent, MD  DULoxetine (CYMBALTA) 60 MG capsule TAKE ONE CAPSULE BY MOUTH DAILY 04/18/21   Alice Ghent, MD  ferrous sulfate (FERROUSUL) 325 (65 FE) MG tablet Take 1 tablet (325 mg total) by mouth daily with breakfast. 08/22/18   Alice Ghent, MD  fluticasone (FLONASE) 50 MCG/ACT nasal spray PLACE 2 SPRAYS INTO BOTH NOSTRILS DAILY. 01/28/20   Alice Ghent, MD  levothyroxine (SYNTHROID) 112 MCG tablet TAKE ONE TABLET BY MOUTH DAILY 11/08/21   Alice Ghent, MD  methocarbamol (ROBAXIN) 500 MG tablet Take 1 tablet (500 mg total) by mouth every 6 (six) hours as needed for muscle spasms. Please contact clinic about follow  up with palliative care 05/24/21   Alice Ghent, MD  Multiple Vitamin (MULTIVITAMIN WITH MINERALS) TABS tablet Take 1 tablet by mouth daily.    [provider]  nitroGLYCERIN (NITROSTAT)  0.4 MG SL tablet Place 1 tablet (0.4 mg total) under the tongue every 5 (five) minutes as needed for chest pain. 02/23/20   Belva Crome, MD  oxybutynin (DITROPAN) 5 MG tablet Take 1 tablet (5 mg total) by mouth at bedtime as needed for bladder spasms. 03/20/21   Alice Ghent, MD  OXYGEN Inhale 3-3.5 L into the lungs continuous.    [provider]  pantoprazole (PROTONIX) 20 MG tablet TAKE ONE TABLET BY MOUTH TWICE DAILY 10/23/21   Alice Ghent, MD  rosuvastatin (CRESTOR) 10 MG tablet Take 1 tablet (10 mg total) by mouth daily. 10/23/21   Alice Ghent, MD  SPIRIVA HANDIHALER 18 MCG inhalation capsule INHALE THE CONTENTS OF ONE CAPSULE VIA HANDIHALER DAILY 10/23/21   Alice Ghent, MD  traZODone (DESYREL) 50 MG tablet Take 100-150 mg by mouth at bedtime. 09/01/21   [provider]  bisoprolol (ZEBETA) 5 MG tablet Take 0.5 tablets (2.5 mg total) by mouth daily. 01/31/19 09/18/19  Belva Crome, MD     Objective    Physical Exam: Vitals:   12/06/21 1929 12/06/21 1930 12/06/21 1939 12/06/21 2009  BP:  (!) 145/79    Pulse: 68 64    Resp: 14 17    Temp:    98.8 F (37.1 C)  TempSrc:    Axillary  SpO2: 100% 99% 100%   Weight:      Height:        General: appears to be stated age; alert, oriented; mildly increased work of breathing noted Skin: warm, dry, no rash Head:  AT/[place] Mouth:  Oral mucosa membranes appear dry, normal dentition Neck: supple; trachea midline Heart:  RRR; did not appreciate any M/R/G Lungs: CTAB, did not appreciate any wheezes, rales, or rhonchi Abdomen: + BS; soft, ND, NT Vascular: 2+ pedal pulses b/l; 2+ radial pulses b/l Extremities: no peripheral edema, no muscle wasting Neuro: strength and sensation intact in upper and lower extremities  b/l    Labs on Admission: I have personally reviewed following labs and imaging studies  CBC: Recent Labs  Lab 12/06/21 1651  WBC 4.7  NEUTROABS 3.3  HGB 10.0*  HCT 32.8*  MCV 105.5*  PLT 144*   Basic Metabolic Panel: Recent Labs  Lab 12/06/21 1651 12/06/21 1854  NA 144  --   K 3.7  --   CL 101  --   CO2 37*  --   GLUCOSE 98  --   BUN 19  --   CREATININE 0.53  --   CALCIUM 9.2  --   MG  --  2.4   GFR: Estimated Creatinine Clearance: 39.9 mL/min (by C-G formula based on SCr of 0.53 mg/dL). Liver Function Tests: Recent Labs  Lab 12/06/21 1651  AST 22  ALT 11  ALKPHOS 36*  BILITOT 1.0  PROT 6.7  ALBUMIN 3.5   No results for input(s): LIPASE, AMYLASE in the last 168 hours. No results for input(s): AMMONIA in the last 168 hours. Coagulation Profile: No results for input(s): INR, PROTIME in the last 168 hours. Cardiac Enzymes: Recent Labs  Lab 12/06/21 1651  CKTOTAL 17*   BNP (last 3 results) No results for input(s): PROBNP in the last 8760 hours. HbA1C: No results for input(s): HGBA1C in the last 72 hours. CBG: No results for input(s): GLUCAP in the last 168 hours. Lipid Profile: No results for input(s): CHOL, HDL, LDLCALC, TRIG, CHOLHDL, LDLDIRECT in the last 72 hours. Thyroid Function Tests: No results  for input(s): TSH, T4TOTAL, FREET4, T3FREE, THYROIDAB in the last 72 hours. Anemia Panel: No results for input(s): VITAMINB12, FOLATE, FERRITIN, TIBC, IRON, RETICCTPCT in the last 72 hours. Urine analysis:    Component Value Date/Time   COLORURINE YELLOW 12/06/2021 2005   APPEARANCEUR CLEAR 12/06/2021 2005   LABSPEC >1.046 (H) 12/06/2021 2005   PHURINE 5.0 12/06/2021 2005   GLUCOSEU NEGATIVE 12/06/2021 2005   GLUCOSEU NEGATIVE 10/24/2016 1429   HGBUR NEGATIVE 12/06/2021 2005   BILIRUBINUR NEGATIVE 12/06/2021 2005   BILIRUBINUR negative 09/20/2016 1625   KETONESUR 5 (A) 12/06/2021 2005   PROTEINUR NEGATIVE 12/06/2021 2005   UROBILINOGEN 0.2  10/24/2016 1429   NITRITE NEGATIVE 12/06/2021 2005   LEUKOCYTESUR NEGATIVE 12/06/2021 2005    Radiological Exams on Admission: DG Chest 1 View  Result Date: 12/06/2021 CLINICAL DATA:  Fall last night, unwitnessed EXAM: CHEST  1 VIEW COMPARISON:  None Available. FINDINGS: Unchanged cardiomediastinal silhouette. No focal airspace disease. No large pleural effusion. No pneumothorax. There is possible nondisplaced right anterior tenth rib fracture. Left glenohumeral osteoarthritis. Prior right anatomic shoulder arthroplasty with glenoid remodeling and diminished acromial humeral space likely reflecting rotator cuff insufficiency. IMPRESSION: No acute cardiopulmonary disease. Possible right anterior tenth rib fracture, correlate with point tenderness. Electronically Signed   By: Maurine Simmering M.D.   On: 12/06/2021 17:51   DG Pelvis 1-2 Views  Result Date: 12/06/2021 CLINICAL DATA:  Fall, left pelvic and right upper leg pain EXAM: PELVIS - 1-2 VIEW COMPARISON:  None Available. FINDINGS: There is no evidence of acute fracture on single frontal view of the pelvis. There is mild bilateral hip osteoarthritis. Mild bilateral SI joint degenerative change. IMPRESSION: No evidence of acute fracture on single frontal view of the pelvis. Electronically Signed   By: Maurine Simmering M.D.   On: 12/06/2021 17:52   CT HEAD WO CONTRAST (5MM)  Result Date: 12/06/2021 CLINICAL DATA:  Head trauma, intracranial venous injury suspected; Neck trauma (Age >= 65y) EXAM: CT HEAD WITHOUT CONTRAST CT CERVICAL SPINE WITHOUT CONTRAST TECHNIQUE: Multidetector CT imaging of the head and cervical spine was performed following the standard protocol without intravenous contrast. Multiplanar CT image reconstructions of the cervical spine were also generated. RADIATION DOSE REDUCTION: This exam was performed according to the departmental dose-optimization program which includes automated exposure control, adjustment of the mA and/or kV according  to patient size and/or use of iterative reconstruction technique. COMPARISON:  CT head and cervical spine 09/25/2021 FINDINGS: CT HEAD FINDINGS Brain: No evidence of acute intracranial hemorrhage.Unchanged bifrontal hygromas.The ventricles are unchanged in size.Confluent periventricular and subcortical white matter hypoattenuation, which is nonspecific but likely sequela of chronic small vessel ischemic disease.Moderate cerebral atrophy Vascular: No hyperdense vessel. Skull: Negative for skull fracture. Sinuses/Orbits: Trace mastoid effusions. Ethmoid air cell and sphenoid sinus mucosal thickening with some frothy material. Orbits are unremarkable. Other: None. CT CERVICAL SPINE FINDINGS Alignment: Normal. Skull base and vertebrae: No acute fracture. No primary bone lesion or focal pathologic process. Soft tissues and spinal canal: No prevertebral fluid or swelling. No visible canal hematoma. Disc levels: There is mild multilevel degenerative disc disease worst at C4-C5 and C5-C6. There is mild multilevel facet arthropathy. Upper chest: Negative. Other: None. IMPRESSION: No acute intracranial abnormality. Unchanged chronic small vessel ischemic disease. No acute cervical spine fracture. Electronically Signed   By: Maurine Simmering M.D.   On: 12/06/2021 17:25   CT Cervical Spine Wo Contrast  Result Date: 12/06/2021 CLINICAL DATA:  Head trauma, intracranial venous injury suspected; Neck trauma (  Age >= 65y) EXAM: CT HEAD WITHOUT CONTRAST CT CERVICAL SPINE WITHOUT CONTRAST TECHNIQUE: Multidetector CT imaging of the head and cervical spine was performed following the standard protocol without intravenous contrast. Multiplanar CT image reconstructions of the cervical spine were also generated. RADIATION DOSE REDUCTION: This exam was performed according to the departmental dose-optimization program which includes automated exposure control, adjustment of the mA and/or kV according to patient size and/or use of iterative  reconstruction technique. COMPARISON:  CT head and cervical spine 09/25/2021 FINDINGS: CT HEAD FINDINGS Brain: No evidence of acute intracranial hemorrhage.Unchanged bifrontal hygromas.The ventricles are unchanged in size.Confluent periventricular and subcortical white matter hypoattenuation, which is nonspecific but likely sequela of chronic small vessel ischemic disease.Moderate cerebral atrophy Vascular: No hyperdense vessel. Skull: Negative for skull fracture. Sinuses/Orbits: Trace mastoid effusions. Ethmoid air cell and sphenoid sinus mucosal thickening with some frothy material. Orbits are unremarkable. Other: None. CT CERVICAL SPINE FINDINGS Alignment: Normal. Skull base and vertebrae: No acute fracture. No primary bone lesion or focal pathologic process. Soft tissues and spinal canal: No prevertebral fluid or swelling. No visible canal hematoma. Disc levels: There is mild multilevel degenerative disc disease worst at C4-C5 and C5-C6. There is mild multilevel facet arthropathy. Upper chest: Negative. Other: None. IMPRESSION: No acute intracranial abnormality. Unchanged chronic small vessel ischemic disease. No acute cervical spine fracture. Electronically Signed   By: Maurine Simmering M.D.   On: 12/06/2021 17:25   CT Angio Chest/Abd/Pel for Dissection W and/or Wo Contrast  Result Date: 12/06/2021 CLINICAL DATA:  Fall, thoracic aortic aneurysm, suspected acute aortic syndrome, history of right breast cancer EXAM: CT ANGIOGRAPHY CHEST, ABDOMEN AND PELVIS TECHNIQUE: Non-contrast CT of the chest was initially obtained. Multidetector CT imaging through the chest, abdomen and pelvis was performed using the standard protocol during bolus administration of intravenous contrast. Multiplanar reconstructed images and MIPs were obtained and reviewed to evaluate the vascular anatomy. RADIATION DOSE REDUCTION: This exam was performed according to the departmental dose-optimization program which includes automated exposure  control, adjustment of the mA and/or kV according to patient size and/or use of iterative reconstruction technique. CONTRAST:  171m OMNIPAQUE IOHEXOL 350 MG/ML SOLN COMPARISON:  12/06/2021, 09/14/2019 FINDINGS: CTA CHEST FINDINGS Cardiovascular: Ascending thoracic aortic aneurysm measures approximately 4.3 cm, not significantly changed since prior study. There is diffuse atherosclerosis of the aortic arch, with stable ulcerative atheromatous plaque within the proximal arch as before. No evidence of periaortic fat stranding to suggest leak, rupture, or inflammation. No evidence of dissection. While not optimized for opacification of the pulmonary vasculature, there is adequate opacification of the pulmonary artery. No filling defects or pulmonary emboli. The heart is unremarkable without pericardial effusion. Stable coronary artery atherosclerosis. Mediastinum/Nodes: Progressive right axillary adenopathy, with largest lymph node measuring up to 16 mm in short axis reference image 13/6, previously measuring 10 mm. This is concerning for metastatic disease in a patient with a known history of right breast cancer. No pathologic mediastinal or hilar adenopathy. Thyroid gland, trachea, and esophagus demonstrate no significant findings. Lungs/Pleura: No acute airspace disease, effusion, or pneumothorax. Central airways are patent. Musculoskeletal: The bones are osteopenic. No acute displaced fractures. Partial visualization of a right shoulder arthroplasty. Chronic anterior wedging of the T3 vertebral body with less than 25% loss of height. Reconstructed images demonstrate no additional findings. Review of the MIP images confirms the above findings. CTA ABDOMEN AND PELVIS FINDINGS VASCULAR Aorta: Fusiform dilation of the abdominal aorta, with 5 cm infrarenal abdominal aortic aneurysm. Diffuse atherosclerosis. No evidence of  dissection. No periaortic fat stranding to suggest leak or rupture. Celiac: Patent without evidence  of aneurysm, dissection, vasculitis or significant stenosis. SMA: Patent without evidence of aneurysm, dissection, vasculitis or significant stenosis. Renals: Both renal arteries are patent without evidence of aneurysm, dissection, vasculitis, fibromuscular dysplasia or significant stenosis. Bilateral accessory renal arteries are incidentally noted. IMA: Patent without evidence of aneurysm, dissection, vasculitis or significant stenosis. Inflow: Patent without evidence of aneurysm, dissection, vasculitis or significant stenosis. Veins: No obvious venous abnormality within the limitations of this arterial phase study. Review of the MIP images confirms the above findings. NON-VASCULAR Hepatobiliary: No focal liver abnormality is seen. No gallstones, gallbladder wall thickening, or biliary dilatation. Pancreas: Unremarkable. No pancreatic ductal dilatation or surrounding inflammatory changes. Spleen: Normal in size without focal abnormality. Adrenals/Urinary Tract: Adrenal glands are unremarkable. Kidneys are normal, without renal calculi, focal lesion, or hydronephrosis. Bladder is unremarkable. Stomach/Bowel: No bowel obstruction or ileus. No bowel wall thickening or inflammatory change. Lymphatic: No pathologic adenopathy within the abdomen or pelvis. Reproductive: Markedly enlarged fibroid uterus measures 16.4 x 15.4 x 14.0 cm. No adnexal masses. Other: No free fluid or free intraperitoneal gas. No abdominal wall hernia. Musculoskeletal: No acute or destructive bony lesions. Multilevel thoracolumbar spondylosis. Chronic T11 compression deformity. Reconstructed images demonstrate no additional findings. Review of the MIP images confirms the above findings. IMPRESSION: Vascular: 1. Ulcerative atheromatous plaque within the aortic arch, without significant change since prior study. No evidence of periaortic fat stranding to suggest inflammation, leak, or pending rupture. 2. 5 cm infrarenal abdominal aortic aneurysm,  as well as a 4.3 cm ascending thoracic aortic aneurysm. Recommend follow-up every 6 months and vascular consultation. Reference: J Am Coll Radiol 4854;62:703-500. 3. No evidence of thoracoabdominal aortic dissection. 4. No evidence of pulmonary embolus. 5.  Aortic Atherosclerosis (ICD10-I70.0). Nonvascular: 1. Enlarging right axillary lymphadenopathy, concerning for metastatic disease in a patient with a known history of right breast cancer. Appropriate outpatient mammographic and sonographic follow-up recommended. 2. No acute intrathoracic, intra-abdominal, or intrapelvic trauma. 3. Stable enlarged fibroid uterus. Electronically Signed   By: Randa Ngo M.D.   On: 12/06/2021 19:14   DG FEMUR, MIN 2 VIEWS RIGHT  Result Date: 12/06/2021 CLINICAL DATA:  Right leg pain EXAM: RIGHT FEMUR 2 VIEWS COMPARISON:  None Available. FINDINGS: There is no evidence of acute fracture involving the right femur. There is a right total knee arthroplasty noted and normal alignment. No right knee joint effusion. There is mild right hip osteoarthritis. IMPRESSION: No evidence of acute fracture involving the right femur. Electronically Signed   By: Maurine Simmering M.D.   On: 12/06/2021 17:53     EKG: Independently reviewed, with result as described above.    Assessment/Plan    Principal Problem:   Acute exacerbation of chronic obstructive pulmonary disease (COPD) (HCC) Active Problems:   Acquired hypothyroidism   Anxiety   Thoracic aortic aneurysm   AAA (abdominal aortic aneurysm) (HCC)   Chronic anemia   Generalized weakness   Acute metabolic encephalopathy   Ground-level fall   Breast cancer (HCC)   Chronic systolic CHF (congestive heart failure) (HCC)      #) Acute COPD exacerbation: in the context of a documented history of at at least severe COPD associated with chronic hypoxic respiratory failure on 3 L continuous nasal cannula, diagnosis of acute exacerbation is suspected on the basis of 2 days of  progressive shortness of breath associated with increased work of breathing, increased serum bicarbonate observed on CMP.  This  was also associated with report of patient's confusion over the last day, which appears to have improved following course of BiPAP in the ED, will noting initial VBG showing compensated hypercapnia.  The patient is now off BiPAP, and will repeat VBG to correlate for interval changes in PCO2 level.  No overt evidence of acute on chronic hypoxia thus far.  Etiology for patient's acute COPD exacerbation undoubtedly clear at this time, as chest x-ray shows no evidence of acute cardiopulmonary process, including no evidence of infiltrate, edema, effusion, or pneumothorax, while CTA chest shows ascending thoracic aortic aneurysm of 5 cm, without associated evidence for rupture/dissection, but otherwise shows no evidence of acute intrathoracic process.  Potential contribution from seasonal allergies in the setting of documented history of allergic rhinitis, on scheduled Flonase as an outpatient, with the patient also noting a recent increase in rhinitis/rhinorrhea.  No overt evidence of underlying bacterial pneumonia, but will check COVID-19 PCR to further evaluate for viral etiology, particularly given preceding worsening of rhinitis/rhinorrhea leading up to development of shortness of breath.  May also consider viral respiratory panel if COVID-19 PCR negative.  no clinical or radiographic evidence to suggest acutely decompensated heart failure at this time, including BNP, which is significantly lower relative to most recent prior value, as quantified above.  Additionally, ACS appears less likely in the absence of chest pain, with EKG showing no evidence of acute ischemic changes, and with negative Troponin results. Clinically, acute PE also appears to be less likely at this time.   Patient confirms compliance on home respiratory regimen, as above and confirms that she is a former smoker,  having completely quit smoking in the late 80s after a nearly 30-pack-year history thereof.    Plan: monitor continuous pulse oxymetry. Monitor on telemetry. Solumedrol. Scheduled duonebs q6 hours. Prn albuterol inhaler. BMP in the morning. Repeat CBC in the morning. Check serum Mg and Phos levels. Will attempt additional chart review to evaluate most recent PFT results. Will start Azithromycin  for benefit of shortened duration of hospitalization associated with antibiotic initiation in the setting of acute COPD exacerbation.  Repeat blood gas now, as above.  check COVID-19 PCR.         #) Generalized weakness: 1 to 2 days of generalized weakness in the absence of any acute focal neurologic deficits to suggest acute stroke, while presenting CT head shows no evidence of acute intracranial process, including no evidence of intracranial hemorrhage or any evidence of acute infarct.  Additionally, no evidence of acute focal neurologic deficits at presentation to warrant further evaluation via MRI at this time.  Suspect contribution from presenting acute COPD exacerbation, including potential associated viral etiology, as above.  Otherwise, no overt evidence of underlying infectious process at this time, including no infiltrate on presenting chest x-ray/CTA chest.  We will follow-up result urinalysis to further evaluate.  Plan: Further evaluation management of suspected acute COPD exacerbation, as above.  Repeat VBG, as above.  Fall precautions.  Follow-up result of urinalysis.  Check ionized calcium level, MMA, TSH.  PT/OT consults ordered for the morning.  Repeat CMP and CBC in the morning.          #) Acute metabolic encephalopathy: Per discussions with the patient's daughter, it appears that the patient was exhibiting evidence of some mild confusion earlier this morning relative to baseline mental status, which is improved following course of BiPAP in the ED today, which appears consistent  with suspected diagnosis of acute COPD exacerbation, with plan  to repeat blood gas at this time to further correlate with interval trend in PCO2 values, as above. CT head showed no evidence of acute intracranial process, and acute ischemic CVA appears less like at this time in the absence of any corresponding acute focal neurologic deficits at this time.  History of prior TIAs is noted.  Further evaluation for potential viral respiratory tract infection contributing to presenting acute COPD exacerbation, as above.  Will follow-up for result of urinalysis.  Otherwise, no overt evidence of underlying factious process at this time.  Clinically, seizures also appear less likely at present.  Of note, as patient's mental status appears back to baseline at this time, will initiate diet at this time.  Differential also includes the possibility contributions from outpatient polypharmacy, including several central acting medications on her home list.  We will be conservative with resumption of the central acting medications, closely monitor for interval trend in mental status.    Plan: Further evaluation management of suspected presenting acute COPD exacerbation, including repeat VBG, as above.  Follow-up results of COVID-19 PCR as well as urinalysis.  Check urine drug screen, and his calcium level, MMA, TSH.  Repeat CMP/CBC in the morning.  Consider prn vbg for worsening mental status.  For now, will hold home as needed Xanax, scheduled BuSpar, Cymbalta.          #) Ground level mechanical fall: it appears that the patient experienced a ground level fall that appears to be purely mechanical in nature, as the patients to have tripped while ambulating at home earlier today, without any preceding or resultant loss of consciousness. Not a/w any presyncope, syncope, or chest pain. Did not hit head as a component of this fall, with CT head showed no evidence of acute intracranial process, as above, notable in the  context of outpatient Plavix. Principal point of contact appears to be in the right hip, although plain films of the pelvis as well as right hip showed no evidence of acute fracture, as further detailed above. Presentation does not appear to be associated any acute neurologic deficits. While this fall appears to be purely mechanical in nature, it appears that she was at increased risk for such in the context of concomitant generalized weakness, and will evaluate for additional underlying factors that may have predisposed patient to experience such a fall, including potential contribution from suspected acute COPD exacerbation, as well as further evaluation for any contributory underlying infectious process, including following for results of UA.    Plan: Check urinalysis, as above.  Repeat CMP and CBC with differential in the morning.  Fall precautions. PT/OT consult ordered.  Further evaluation management of acute COPD exacerbation, as above, including repeat VBG.          #) Ascending thoracic aortic aneurysm/abdominal aortic aneurysm: CTA chest, abdomen, pelvis with dissection protocol performed today showed evidence of a 5 cm infrarenal abdominal aortic aneurysm without evidence of dissection or rupture, as well as a 4.3 cm ascending thoracic aortic aneurysm, without evidence of dissection or rupture. EDP discussed patient's case with the on-call vascular surgeon, Dr. Trula Slade, Who after reviewing imaging, agreed that presentation is not consistent with dissection or rupture, and  recommended outpatient referral to vascular surgery following discharge.  Additionally, radiology recommended follow-up imaging in 6 months for further associated monitoring.  Plan: Outpatient referral to vascular surgery, as above.  Per radiology recommendation, acute 27-monthimaging follow-up.  Close monitoring of ensuing heart rate/blood pressure via routine vital signs.  Repeat CBC in the  morning.              #) Chronic systolic heart failure: documented history of such, with most recent echocardiogram performed in January 2020, with results notable for LVEF 40 to 45%, with additional details as conveyed above. No clinical or radiographic evidence to suggest acutely decompensated heart failure at this time. home diuretic regimen reportedly consists of the following: None.  Of note, presenting EKG straits sinus rhythm without overt evidence of acute ischemic changes.   Plan: monitor strict I's & O's and daily weights. Repeat BMP in AM. Check serum mag level.          #) Hyperlipidemia: documented h/o such. On rosuvastatin as outpatient.    Plan: continue home statin.             #) acquired hypothyroidism: documented h/o such, on Synthroid as outpatient.   Plan: cont home Synthroid.  In the setting of initial altered mental status, will also check TSH level.           #) Generalized anxiety disorder: Documented history of such, on scheduled BuSpar as well as as needed Xanax as an outpatient.  In the context of patient's initial altered mental status, will hold these have central acting medications for now.  Plan: Hold home BuSpar and as needed Xanax for now.        #) Breast Cancer: Documented history of such, on Arimidex as an outpatient.  Plan: cont home Arimidex . Further chart review to gather additional clinical perspective/history, including staging.           #) Chronic anemia: Documented history of such, on daily oral iron therapy as an outpatient, and associated baseline hemoglobin range of 9.5-11, presenting labs demonstrate hemoglobin level consistent with his baseline range, in the absence of any overt evidence of active bleed.  Of note, patient's anemia is found to be macrocytic at time of today's presentation.    Plan: Repeat CBC in the morning.  Continue daily oral iron therapy.  Check MMA level as well as  folic acid level.         DVT prophylaxis: SCD's   Code Status: DNR/DNI (per my discussions with the patient, as well as her daughter, present at bedside, which is consistent with CODE STATUS during most recent prior 3 hospitalizations) Family Communication: I discussed the patient's case with her daughter, [name], who is present at bedside, as above. Disposition Plan: Per Rounding Team Consults called: none;  Admission status: Inpatient   PLEASE NOTE THAT DRAGON DICTATION SOFTWARE WAS USED IN THE CONSTRUCTION OF THIS NOTE.   [place] DO [facility] [name]   12/06/2021, 9:03 PM

## 2021-12-06 NOTE — ED Notes (Signed)
Patient transported to X-ray 

## 2021-12-06 NOTE — Assessment & Plan Note (Signed)
 #)   acquired hypothyroidism: documented h/o such, on Synthroid as outpatient.   Plan: cont home Synthroid.  In the setting of initial altered mental status, will also check TSH level.

## 2021-12-06 NOTE — Progress Notes (Signed)
Pt placed back on bipap for the night. 

## 2021-12-06 NOTE — Assessment & Plan Note (Signed)
 #)   Chronic anemia: Documented history of such, on daily oral iron therapy as an outpatient, and associated baseline hemoglobin range of 9.5-11, presenting labs demonstrate hemoglobin level consistent with his baseline range, in the absence of any overt evidence of active bleed.  Of note, patient's anemia is found to be macrocytic at time of today's presentation.    Plan: Repeat CBC in the morning.  Continue daily oral iron therapy.  Check MMA level as well as folic acid level.

## 2021-12-06 NOTE — ED Notes (Addendum)
Pt to CT

## 2021-12-06 NOTE — Assessment & Plan Note (Signed)
(  see AAA)

## 2021-12-06 NOTE — Assessment & Plan Note (Signed)
  #)   Ascending thoracic aortic aneurysm/abdominal aortic aneurysm: CTA chest, abdomen, pelvis with dissection protocol performed today showed evidence of a 5 cm infrarenal abdominal aortic aneurysm without evidence of dissection or rupture, as well as a 4.3 cm ascending thoracic aortic aneurysm, without evidence of dissection or rupture. EDP discussed patient's case with the on-call vascular surgeon, Dr. Trula Slade, Who after reviewing imaging, agreed that presentation is not consistent with dissection or rupture, and  recommended outpatient referral to vascular surgery following discharge.  Additionally, radiology recommended follow-up imaging in 6 months for further associated monitoring.  Plan: Outpatient referral to vascular surgery, as above.  Per radiology recommendation, acute 63-monthimaging follow-up.  Close monitoring of ensuing heart rate/blood pressure via routine vital signs.  Repeat CBC in the morning.

## 2021-12-06 NOTE — Assessment & Plan Note (Signed)
#)   Generalized weakness: 1 to 2 days of generalized weakness in the absence of any acute focal neurologic deficits to suggest acute stroke, while presenting CT head shows no evidence of acute intracranial process, including no evidence of intracranial hemorrhage or any evidence of acute infarct.  Additionally, no evidence of acute focal neurologic deficits at presentation to warrant further evaluation via MRI at this time.  Suspect contribution from presenting acute COPD exacerbation, including potential associated viral etiology, as above.  Otherwise, no overt evidence of underlying infectious process at this time, including no infiltrate on presenting chest x-ray/CTA chest.  We will follow-up result urinalysis to further evaluate.  Plan: Further evaluation management of suspected acute COPD exacerbation, as above.  Repeat VBG, as above.  Fall precautions.  Follow-up result of urinalysis.  Check ionized calcium level, MMA, TSH.  PT/OT consults ordered for the morning.  Repeat CMP and CBC in the morning.

## 2021-12-06 NOTE — ED Provider Notes (Addendum)
[facility] DEPT Provider Note   CSN: [id number] Arrival date & time: 12/06/21  1609     History  Chief Complaint  Patient presents with   Weakness    [name] is a 88 y.o. female hx of COPD, HTN, thoracic aneurysm, here with weakness and trouble breathing.  Patient states that she has been weaker than usual.  She apparently had a fall earlier today but did not hit her head.  She was noted to be more confused. She states that she walked to the kitchen and got very short of breath so came to the ED. She complains of right leg pain.   The history is provided by the patient.      Home Medications Prior to Admission medications   Medication Sig Start Date End Date Taking? Authorizing Provider  acetaminophen (TYLENOL) 500 MG tablet Take 1,000 mg by mouth every 6 (six) hours as needed for moderate pain or headache.    [provider]  albuterol (PROAIR HFA) 108 (90 Base) MCG/ACT inhaler Inhale 1-2 puffs into the lungs every 6 (six) hours as needed for wheezing or shortness of breath (Okay to fill with Ventolin or albuterol HFA). 10/30/19   Tonia Ghent, MD  alendronate (FOSAMAX) 70 MG tablet Take 1 tablet (70 mg total) by mouth every Monday. 10/02/21   Benay Pike, MD  ALPRAZolam Duanne Moron) 1 MG tablet TAKE 1/2 TABLET BY MOUTH THREE TIMES DAILY AS NEEDED FOR ANXIETY 11/19/21   Tonia Ghent, MD  anastrozole (ARIMIDEX) 1 MG tablet Take 1 tablet (1 mg total) by mouth daily. 10/05/20   Magrinat, Virgie Dad, MD  busPIRone (BUSPAR) 5 MG tablet TAKE ONE TABLET BY MOUTH IN THE MORNING AND IN THE EVENING 10/23/21   Tonia Ghent, MD  Calcium Carbonate (CALTRATE 600 PO) Take 600 mg by mouth daily.    [provider]  cholecalciferol (VITAMIN D) 1000 units tablet Take 1 tablet (1,000 Units total) by mouth daily. 09/25/16   Tonia Ghent, MD  clopidogrel (PLAVIX) 75 MG tablet Take 1 tablet (75 mg total) by mouth daily. Please make  overdue appt with Dr. Tamala Julian before anymore refills. Thank you 3rd and Final attempt 05/18/21   Belva Crome, MD  diclofenac Sodium ([employer] DICLOFENAC SODIUM) 1 % GEL Apply 2 g topically 4 (four) times daily as needed. 11/16/20   Tonia Ghent, MD  donepezil (ARICEPT) 5 MG tablet Take 1 tablet (5 mg total) by mouth at bedtime. 11/28/21   Tonia Ghent, MD  DULoxetine (CYMBALTA) 60 MG capsule TAKE ONE CAPSULE BY MOUTH DAILY 04/18/21   Tonia Ghent, MD  ferrous sulfate (FERROUSUL) 325 (65 FE) MG tablet Take 1 tablet (325 mg total) by mouth daily with breakfast. 08/22/18   Tonia Ghent, MD  fluticasone (FLONASE) 50 MCG/ACT nasal spray PLACE 2 SPRAYS INTO BOTH NOSTRILS DAILY. 01/28/20   Tonia Ghent, MD  levothyroxine (SYNTHROID) 112 MCG tablet TAKE ONE TABLET BY MOUTH DAILY 11/08/21   Tonia Ghent, MD  methocarbamol (ROBAXIN) 500 MG tablet Take 1 tablet (500 mg total) by mouth every 6 (six) hours as needed for muscle spasms. Please contact clinic about follow up with palliative care 05/24/21   Tonia Ghent, MD  Multiple Vitamin (MULTIVITAMIN WITH MINERALS) TABS tablet Take 1 tablet by mouth daily.    [provider]  nitroGLYCERIN (NITROSTAT) 0.4 MG SL tablet Place 1 tablet (0.4 mg total) under the tongue every 5 (  five) minutes as needed for chest pain. 02/23/20   Belva Crome, MD  oxybutynin (DITROPAN) 5 MG tablet Take 1 tablet (5 mg total) by mouth at bedtime as needed for bladder spasms. 03/20/21   Tonia Ghent, MD  OXYGEN Inhale 3-3.5 L into the lungs continuous.    [provider]  pantoprazole (PROTONIX) 20 MG tablet TAKE ONE TABLET BY MOUTH TWICE DAILY 10/23/21   Tonia Ghent, MD  rosuvastatin (CRESTOR) 10 MG tablet Take 1 tablet (10 mg total) by mouth daily. 10/23/21   Tonia Ghent, MD  SPIRIVA HANDIHALER 18 MCG inhalation capsule INHALE THE CONTENTS OF ONE CAPSULE VIA HANDIHALER DAILY 10/23/21   Tonia Ghent, MD  traZODone (DESYREL) 50 MG tablet  Take 100-150 mg by mouth at bedtime. 09/01/21   [provider]  bisoprolol (ZEBETA) 5 MG tablet Take 0.5 tablets (2.5 mg total) by mouth daily. 01/31/19 09/18/19  Belva Crome, MD      Allergies    Doxycycline, Sulfadiazine, and Sulfa antibiotics    Review of Systems   Review of Systems  Respiratory:  Positive for shortness of breath.   Neurological:  Positive for weakness.  All other systems reviewed and are negative.  Physical Exam Updated Vital Signs BP (!) 143/85   Pulse 64   Resp 13   Ht 5' (1.524 m)   Wt 61.7 kg   SpO2 100%   BMI 26.56 kg/m  Physical Exam Vitals and nursing note reviewed.  Constitutional:      Comments: Chronically ill   HENT:     Head: Normocephalic.     Nose: Nose normal.     Mouth/Throat:     Mouth: Mucous membranes are dry.  Eyes:     Extraocular Movements: Extraocular movements intact.     Pupils: Pupils are equal, round, and reactive to light.  Cardiovascular:     Rate and Rhythm: Normal rate and regular rhythm.  Pulmonary:     Comments: Tachypneic  Abdominal:     Comments: Pulsatile mass   Musculoskeletal:     Cervical back: Normal range of motion and neck supple.    ED Results / Procedures / Treatments   Labs (all labs ordered are listed, but only abnormal results are displayed) Labs Reviewed  CBC WITH DIFFERENTIAL/PLATELET - Abnormal; Notable for the following components:      Result Value   RBC 3.11 (*)    Hemoglobin 10.0 (*)    HCT 32.8 (*)    MCV 105.5 (*)    Platelets 131 (*)    All other components within normal limits  COMPREHENSIVE METABOLIC PANEL - Abnormal; Notable for the following components:   CO2 37 (*)    Alkaline Phosphatase 36 (*)    All other components within normal limits  BLOOD GAS, VENOUS - Abnormal; Notable for the following components:   pCO2, Ven 80 (*)    pO2, Ven 31 (*)    Bicarbonate 42.2 (*)    Acid-Base Excess 12.5 (*)    All other components within normal limits  BRAIN NATRIURETIC  PEPTIDE - Abnormal; Notable for the following components:   B Natriuretic Peptide 110.6 (*)    All other components within normal limits  CK - Abnormal; Notable for the following components:   Total CK 17 (*)    All other components within normal limits  URINALYSIS, ROUTINE W REFLEX MICROSCOPIC  TROPONIN I (HIGH SENSITIVITY)  TROPONIN I (HIGH SENSITIVITY)    EKG EKG  Interpretation  Date/Time:  Wednesday Dec 06 2021 17:17:37 EDT Ventricular Rate:  68 PR Interval:  186 QRS Duration: 92 QT Interval:  435 QTC Calculation: 463 R Axis:   39 Text Interpretation: Sinus rhythm Consider anterior infarct No significant change since last tracing Confirmed by Wandra Arthurs [phone]) on 12/06/2021 5:26:08 PM  Radiology DG Chest 1 View  Result Date: 12/06/2021 CLINICAL DATA:  Fall last night, unwitnessed EXAM: CHEST  1 VIEW COMPARISON:  None Available. FINDINGS: Unchanged cardiomediastinal silhouette. No focal airspace disease. No large pleural effusion. No pneumothorax. There is possible nondisplaced right anterior tenth rib fracture. Left glenohumeral osteoarthritis. Prior right anatomic shoulder arthroplasty with glenoid remodeling and diminished acromial humeral space likely reflecting rotator cuff insufficiency. IMPRESSION: No acute cardiopulmonary disease. Possible right anterior tenth rib fracture, correlate with point tenderness. Electronically Signed   By: Maurine Simmering M.D.   On: 12/06/2021 17:51   DG Pelvis 1-2 Views  Result Date: 12/06/2021 CLINICAL DATA:  Fall, left pelvic and right upper leg pain EXAM: PELVIS - 1-2 VIEW COMPARISON:  None Available. FINDINGS: There is no evidence of acute fracture on single frontal view of the pelvis. There is mild bilateral hip osteoarthritis. Mild bilateral SI joint degenerative change. IMPRESSION: No evidence of acute fracture on single frontal view of the pelvis. Electronically Signed   By: Maurine Simmering M.D.   On: 12/06/2021 17:52   CT HEAD WO CONTRAST  (5MM)  Result Date: 12/06/2021 CLINICAL DATA:  Head trauma, intracranial venous injury suspected; Neck trauma (Age >= 65y) EXAM: CT HEAD WITHOUT CONTRAST CT CERVICAL SPINE WITHOUT CONTRAST TECHNIQUE: Multidetector CT imaging of the head and cervical spine was performed following the standard protocol without intravenous contrast. Multiplanar CT image reconstructions of the cervical spine were also generated. RADIATION DOSE REDUCTION: This exam was performed according to the departmental dose-optimization program which includes automated exposure control, adjustment of the mA and/or kV according to patient size and/or use of iterative reconstruction technique. COMPARISON:  CT head and cervical spine 09/25/2021 FINDINGS: CT HEAD FINDINGS Brain: No evidence of acute intracranial hemorrhage.Unchanged bifrontal hygromas.The ventricles are unchanged in size.Confluent periventricular and subcortical white matter hypoattenuation, which is nonspecific but likely sequela of chronic small vessel ischemic disease.Moderate cerebral atrophy Vascular: No hyperdense vessel. Skull: Negative for skull fracture. Sinuses/Orbits: Trace mastoid effusions. Ethmoid air cell and sphenoid sinus mucosal thickening with some frothy material. Orbits are unremarkable. Other: None. CT CERVICAL SPINE FINDINGS Alignment: Normal. Skull base and vertebrae: No acute fracture. No primary bone lesion or focal pathologic process. Soft tissues and spinal canal: No prevertebral fluid or swelling. No visible canal hematoma. Disc levels: There is mild multilevel degenerative disc disease worst at C4-C5 and C5-C6. There is mild multilevel facet arthropathy. Upper chest: Negative. Other: None. IMPRESSION: No acute intracranial abnormality. Unchanged chronic small vessel ischemic disease. No acute cervical spine fracture. Electronically Signed   By: Maurine Simmering M.D.   On: 12/06/2021 17:25   CT Cervical Spine Wo Contrast  Result Date: 12/06/2021 CLINICAL  DATA:  Head trauma, intracranial venous injury suspected; Neck trauma (Age >= 65y) EXAM: CT HEAD WITHOUT CONTRAST CT CERVICAL SPINE WITHOUT CONTRAST TECHNIQUE: Multidetector CT imaging of the head and cervical spine was performed following the standard protocol without intravenous contrast. Multiplanar CT image reconstructions of the cervical spine were also generated. RADIATION DOSE REDUCTION: This exam was performed according to the departmental dose-optimization program which includes automated exposure control, adjustment of the mA and/or kV according to patient size and/or  use of iterative reconstruction technique. COMPARISON:  CT head and cervical spine 09/25/2021 FINDINGS: CT HEAD FINDINGS Brain: No evidence of acute intracranial hemorrhage.Unchanged bifrontal hygromas.The ventricles are unchanged in size.Confluent periventricular and subcortical white matter hypoattenuation, which is nonspecific but likely sequela of chronic small vessel ischemic disease.Moderate cerebral atrophy Vascular: No hyperdense vessel. Skull: Negative for skull fracture. Sinuses/Orbits: Trace mastoid effusions. Ethmoid air cell and sphenoid sinus mucosal thickening with some frothy material. Orbits are unremarkable. Other: None. CT CERVICAL SPINE FINDINGS Alignment: Normal. Skull base and vertebrae: No acute fracture. No primary bone lesion or focal pathologic process. Soft tissues and spinal canal: No prevertebral fluid or swelling. No visible canal hematoma. Disc levels: There is mild multilevel degenerative disc disease worst at C4-C5 and C5-C6. There is mild multilevel facet arthropathy. Upper chest: Negative. Other: None. IMPRESSION: No acute intracranial abnormality. Unchanged chronic small vessel ischemic disease. No acute cervical spine fracture. Electronically Signed   By: Maurine Simmering M.D.   On: 12/06/2021 17:25   CT Angio Chest/Abd/Pel for Dissection W and/or Wo Contrast  Result Date: 12/06/2021 CLINICAL DATA:  Fall,  thoracic aortic aneurysm, suspected acute aortic syndrome, history of right breast cancer EXAM: CT ANGIOGRAPHY CHEST, ABDOMEN AND PELVIS TECHNIQUE: Non-contrast CT of the chest was initially obtained. Multidetector CT imaging through the chest, abdomen and pelvis was performed using the standard protocol during bolus administration of intravenous contrast. Multiplanar reconstructed images and MIPs were obtained and reviewed to evaluate the vascular anatomy. RADIATION DOSE REDUCTION: This exam was performed according to the departmental dose-optimization program which includes automated exposure control, adjustment of the mA and/or kV according to patient size and/or use of iterative reconstruction technique. CONTRAST:  13m OMNIPAQUE IOHEXOL 350 MG/ML SOLN COMPARISON:  12/06/2021, 09/14/2019 FINDINGS: CTA CHEST FINDINGS Cardiovascular: Ascending thoracic aortic aneurysm measures approximately 4.3 cm, not significantly changed since prior study. There is diffuse atherosclerosis of the aortic arch, with stable ulcerative atheromatous plaque within the proximal arch as before. No evidence of periaortic fat stranding to suggest leak, rupture, or inflammation. No evidence of dissection. While not optimized for opacification of the pulmonary vasculature, there is adequate opacification of the pulmonary artery. No filling defects or pulmonary emboli. The heart is unremarkable without pericardial effusion. Stable coronary artery atherosclerosis. Mediastinum/Nodes: Progressive right axillary adenopathy, with largest lymph node measuring up to 16 mm in short axis reference image 13/6, previously measuring 10 mm. This is concerning for metastatic disease in a patient with a known history of right breast cancer. No pathologic mediastinal or hilar adenopathy. Thyroid gland, trachea, and esophagus demonstrate no significant findings. Lungs/Pleura: No acute airspace disease, effusion, or pneumothorax. Central airways are patent.  Musculoskeletal: The bones are osteopenic. No acute displaced fractures. Partial visualization of a right shoulder arthroplasty. Chronic anterior wedging of the T3 vertebral body with less than 25% loss of height. Reconstructed images demonstrate no additional findings. Review of the MIP images confirms the above findings. CTA ABDOMEN AND PELVIS FINDINGS VASCULAR Aorta: Fusiform dilation of the abdominal aorta, with 5 cm infrarenal abdominal aortic aneurysm. Diffuse atherosclerosis. No evidence of dissection. No periaortic fat stranding to suggest leak or rupture. Celiac: Patent without evidence of aneurysm, dissection, vasculitis or significant stenosis. SMA: Patent without evidence of aneurysm, dissection, vasculitis or significant stenosis. Renals: Both renal arteries are patent without evidence of aneurysm, dissection, vasculitis, fibromuscular dysplasia or significant stenosis. Bilateral accessory renal arteries are incidentally noted. IMA: Patent without evidence of aneurysm, dissection, vasculitis or significant stenosis. Inflow: Patent without evidence of  aneurysm, dissection, vasculitis or significant stenosis. Veins: No obvious venous abnormality within the limitations of this arterial phase study. Review of the MIP images confirms the above findings. NON-VASCULAR Hepatobiliary: No focal liver abnormality is seen. No gallstones, gallbladder wall thickening, or biliary dilatation. Pancreas: Unremarkable. No pancreatic ductal dilatation or surrounding inflammatory changes. Spleen: Normal in size without focal abnormality. Adrenals/Urinary Tract: Adrenal glands are unremarkable. Kidneys are normal, without renal calculi, focal lesion, or hydronephrosis. Bladder is unremarkable. Stomach/Bowel: No bowel obstruction or ileus. No bowel wall thickening or inflammatory change. Lymphatic: No pathologic adenopathy within the abdomen or pelvis. Reproductive: Markedly enlarged fibroid uterus measures 16.4 x 15.4 x 14.0  cm. No adnexal masses. Other: No free fluid or free intraperitoneal gas. No abdominal wall hernia. Musculoskeletal: No acute or destructive bony lesions. Multilevel thoracolumbar spondylosis. Chronic T11 compression deformity. Reconstructed images demonstrate no additional findings. Review of the MIP images confirms the above findings. IMPRESSION: Vascular: 1. Ulcerative atheromatous plaque within the aortic arch, without significant change since prior study. No evidence of periaortic fat stranding to suggest inflammation, leak, or pending rupture. 2. 5 cm infrarenal abdominal aortic aneurysm, as well as a 4.3 cm ascending thoracic aortic aneurysm. Recommend follow-up every 6 months and vascular consultation. Reference: J Am Coll Radiol 5643;32:951-884. 3. No evidence of thoracoabdominal aortic dissection. 4. No evidence of pulmonary embolus. 5.  Aortic Atherosclerosis (ICD10-I70.0). Nonvascular: 1. Enlarging right axillary lymphadenopathy, concerning for metastatic disease in a patient with a known history of right breast cancer. Appropriate outpatient mammographic and sonographic follow-up recommended. 2. No acute intrathoracic, intra-abdominal, or intrapelvic trauma. 3. Stable enlarged fibroid uterus. Electronically Signed   By: Randa Ngo M.D.   On: 12/06/2021 19:14   DG FEMUR, MIN 2 VIEWS RIGHT  Result Date: 12/06/2021 CLINICAL DATA:  Right leg pain EXAM: RIGHT FEMUR 2 VIEWS COMPARISON:  None Available. FINDINGS: There is no evidence of acute fracture involving the right femur. There is a right total knee arthroplasty noted and normal alignment. No right knee joint effusion. There is mild right hip osteoarthritis. IMPRESSION: No evidence of acute fracture involving the right femur. Electronically Signed   By: Maurine Simmering M.D.   On: 12/06/2021 17:53    Procedures Procedures    CRITICAL CARE Performed by: Wandra Arthurs   Total critical care time: 30 minutes  Critical care time was exclusive of  separately billable procedures and treating other patients.  Critical care was necessary to treat or prevent imminent or life-threatening deterioration.  Critical care was time spent personally by me on the following activities: development of treatment plan with patient and/or surrogate as well as nursing, discussions with consultants, evaluation of patient's response to treatment, examination of patient, obtaining history from patient or surrogate, ordering and performing treatments and interventions, ordering and review of laboratory studies, ordering and review of radiographic studies, pulse oximetry and re-evaluation of patient's condition.   Medications Ordered in ED Medications  iohexol (OMNIPAQUE) 300 MG/ML solution 100 mL (has no administration in time range)  methylPREDNISolone sodium succinate (SOLU-MEDROL) 125 mg/2 mL injection 125 mg (has no administration in time range)  albuterol (PROVENTIL) (2.5 MG/3ML) 0.083% nebulizer solution 5 mg (has no administration in time range)  ipratropium (ATROVENT) nebulizer solution 0.5 mg (has no administration in time range)  iohexol (OMNIPAQUE) 350 MG/ML injection 100 mL (100 mLs Intravenous Contrast Given 12/06/21 1846)    ED Course/ Medical Decision Making/ A&P  Medical Decision Making Guida Asman is a 88 y.o. female here presenting with shortness of breath and weakness.  Patient had a mechanical fall earlier and had short of breath with minimal exertion.  Consider COPD versus CHF.  Patient has a known thoracic aneurysm so concern for dissection as well.  Patient is also more confused so we will get a CTA to rule out bleed.  Plan to get CBC and CMP and troponin and BNP and chest x-ray and pelvis x-ray.  7:45 PM Patient's chest x-ray showed possible rib fracture.  I reviewed labs and independently reviewed her imaging studies. CT dissection study showed no dissection but she has a 4.3 cm thoracic aneurysm and  5 cm aortic aneurysm.  I discussed case with the vascular surgeon, Dr. Trula Slade.  He states that he would recommend outpatient follow-up for these 2 aneurysms and no intervention acutely.  Patient had a VBG that showed pH of 7.3 and CO2 of 80.  Patient was put on BiPAP.  Will admit for respiratory distress secondary to COPD.   Problems Addressed: Abdominal aortic aneurysm (AAA) without rupture, unspecified part [facility]): chronic illness or injury Acute on chronic respiratory failure with hypercapnia [facility]): acute illness or injury Thoracic aortic aneurysm without rupture, unspecified part [facility]): chronic illness or injury  Amount and/or Complexity of Data Reviewed Labs: ordered. Decision-making details documented in ED Course. Radiology: ordered and independent interpretation performed. Decision-making details documented in ED Course. ECG/medicine tests: ordered and independent interpretation performed. Decision-making details documented in ED Course.  Risk Prescription drug management. Decision regarding hospitalization.    Final Clinical Impression(s) / ED Diagnoses Final diagnoses:  None    Rx / DC Orders ED Discharge Orders     None         Drenda Freeze, MD 12/06/21 1948    Drenda Freeze, MD 12/06/21 [id number]

## 2021-12-06 NOTE — ED Notes (Signed)
Respiratory called to place bipap.

## 2021-12-06 NOTE — Assessment & Plan Note (Signed)
  #)   Ground level mechanical fall: it appears that the patient experienced a ground level fall that appears to be purely mechanical in nature, as the patients to have tripped while ambulating at home earlier today, without any preceding or resultant loss of consciousness. Not a/w any presyncope, syncope, or chest pain. Did not hit head as a component of this fall, with CT head showed no evidence of acute intracranial process, as above, notable in the context of outpatient Plavix. Principal point of contact appears to be in the right hip, although plain films of the pelvis as well as right hip showed no evidence of acute fracture, as further detailed above. Presentation does not appear to be associated any acute neurologic deficits. While this fall appears to be purely mechanical in nature, it appears that she was at increased risk for such in the context of concomitant generalized weakness, and will evaluate for additional underlying factors that may have predisposed patient to experience such a fall, including potential contribution from suspected acute COPD exacerbation, as well as further evaluation for any contributory underlying infectious process, including following for results of UA.    Plan: Check urinalysis, as above.  Repeat CMP and CBC with differential in the morning.  Fall precautions. PT/OT consult ordered.  Further evaluation management of acute COPD exacerbation, as above, including repeat VBG.

## 2021-12-06 NOTE — Assessment & Plan Note (Signed)
 #)   Acute COPD exacerbation: in the context of a documented history of at at least severe COPD associated with chronic hypoxic respiratory failure on 3 L continuous nasal cannula, diagnosis of acute exacerbation is suspected on the basis of 2 days of progressive shortness of breath associated with increased work of breathing, increased serum bicarbonate observed on CMP.  This was also associated with report of patient's confusion over the last day, which appears to have improved following course of BiPAP in the ED, will noting initial VBG showing compensated hypercapnia.  The patient is now off BiPAP, and will repeat VBG to correlate for interval changes in PCO2 level.  No overt evidence of acute on chronic hypoxia thus far.  Etiology for patient's acute COPD exacerbation undoubtedly clear at this time, as chest x-ray shows no evidence of acute cardiopulmonary process, including no evidence of infiltrate, edema, effusion, or pneumothorax, while CTA chest shows ascending thoracic aortic aneurysm of 5 cm, without associated evidence for rupture/dissection, but otherwise shows no evidence of acute intrathoracic process.  Potential contribution from seasonal allergies in the setting of documented history of allergic rhinitis, on scheduled Flonase as an outpatient, with the patient also noting a recent increase in rhinitis/rhinorrhea.  No overt evidence of underlying bacterial pneumonia, but will check COVID-19 PCR to further evaluate for viral etiology, particularly given preceding worsening of rhinitis/rhinorrhea leading up to development of shortness of breath.  May also consider viral respiratory panel if COVID-19 PCR negative.  no clinical or radiographic evidence to suggest acutely decompensated heart failure at this time, including BNP, which is significantly lower relative to most recent prior value, as quantified above.  Additionally, ACS appears less likely in the absence of chest pain, with EKG showing  no evidence of acute ischemic changes, and with negative Troponin results. Clinically, acute PE also appears to be less likely at this time.   Patient confirms compliance on home respiratory regimen, as above and confirms that she is a former smoker, having completely quit smoking in the late 80s after a nearly 30-pack-year history thereof.    Plan: monitor continuous pulse oxymetry. Monitor on telemetry. Solumedrol. Scheduled duonebs q6 hours. Prn albuterol inhaler. BMP in the morning. Repeat CBC in the morning. Check serum Mg and Phos levels. Will attempt additional chart review to evaluate most recent PFT results. Will start Azithromycin  for benefit of shortened duration of hospitalization associated with antibiotic initiation in the setting of acute COPD exacerbation.  Repeat blood gas now, as above.  check COVID-19 PCR.

## 2021-12-06 NOTE — Assessment & Plan Note (Signed)
 #)   Acute metabolic encephalopathy: Per discussions with the patient's daughter, it appears that the patient was exhibiting evidence of some mild confusion earlier this morning relative to baseline mental status, which is improved following course of BiPAP in the ED today, which appears consistent with suspected diagnosis of acute COPD exacerbation, with plan to repeat blood gas at this time to further correlate with interval trend in PCO2 values, as above. CT head showed no evidence of acute intracranial process, and acute ischemic CVA appears less like at this time in the absence of any corresponding acute focal neurologic deficits at this time.  History of prior TIAs is noted.  Further evaluation for potential viral respiratory tract infection contributing to presenting acute COPD exacerbation, as above.  Will follow-up for result of urinalysis.  Otherwise, no overt evidence of underlying factious process at this time.  Clinically, seizures also appear less likely at present.  Of note, as patient's mental status appears back to baseline at this time, will initiate diet at this time.  Differential also includes the possibility contributions from outpatient polypharmacy, including several central acting medications on her home list.  We will be conservative with resumption of the central acting medications, closely monitor for interval trend in mental status.    Plan: Further evaluation management of suspected presenting acute COPD exacerbation, including repeat VBG, as above.  Follow-up results of COVID-19 PCR as well as urinalysis.  Check urine drug screen, and his calcium level, MMA, TSH.  Repeat CMP/CBC in the morning.  Consider prn vbg for worsening mental status.  For now, will hold home as needed Xanax, scheduled BuSpar, Cymbalta.

## 2021-12-06 NOTE — Assessment & Plan Note (Signed)
 #)   Generalized anxiety disorder: Documented history of such, on scheduled BuSpar as well as as needed Xanax as an outpatient.  In the context of patient's initial altered mental status, will hold these have central acting medications for now.  Plan: Hold home BuSpar and as needed Xanax for now.

## 2021-12-06 NOTE — Assessment & Plan Note (Signed)
  #)   Chronic systolic heart failure: documented history of such, with most recent echocardiogram performed in January 2020, with results notable for LVEF 40 to 45%, with additional details as conveyed above. No clinical or radiographic evidence to suggest acutely decompensated heart failure at this time. home diuretic regimen reportedly consists of the following: None.  Of note, presenting EKG straits sinus rhythm without overt evidence of acute ischemic changes.   Plan: monitor strict I's & O's and daily weights. Repeat BMP in AM. Check serum mag level.

## 2021-12-06 NOTE — Assessment & Plan Note (Signed)
 #)   Breast Cancer: Documented history of such, on Arimidex as an outpatient.  Plan: cont home Arimidex . Further chart review to gather additional clinical perspective/history, including staging.

## 2021-12-06 NOTE — ED Triage Notes (Signed)
Pt BIB EMS from home, c/o weakness. Sustained a fall last night. Caregiver witnessed, denied injury. Hx of breat cancer and dementia. Pt endorsed feeling more lethargic and weak. 200 mL LR given through [facility]. No thinners  BP 142/82 P 68 spO2 99% 2 liters

## 2021-12-07 DIAGNOSIS — G9341 Metabolic encephalopathy: Secondary | ICD-10-CM | POA: Diagnosis not present

## 2021-12-07 DIAGNOSIS — I7143 Infrarenal abdominal aortic aneurysm, without rupture: Secondary | ICD-10-CM | POA: Diagnosis not present

## 2021-12-07 DIAGNOSIS — E039 Hypothyroidism, unspecified: Secondary | ICD-10-CM | POA: Diagnosis not present

## 2021-12-07 DIAGNOSIS — J441 Chronic obstructive pulmonary disease with (acute) exacerbation: Secondary | ICD-10-CM | POA: Diagnosis not present

## 2021-12-07 LAB — CBC WITH DIFFERENTIAL/PLATELET
Abs Immature Granulocytes: 0.01 10*3/uL (ref 0.00–0.07)
Basophils Absolute: 0 10*3/uL (ref 0.0–0.1)
Basophils Relative: 0 %
Eosinophils Absolute: 0 10*3/uL (ref 0.0–0.5)
Eosinophils Relative: 0 %
HCT: 34.4 % — ABNORMAL LOW (ref 36.0–46.0)
Hemoglobin: 10.5 g/dL — ABNORMAL LOW (ref 12.0–15.0)
Immature Granulocytes: 0 %
Lymphocytes Relative: 6 %
Lymphs Abs: 0.3 10*3/uL — ABNORMAL LOW (ref 0.7–4.0)
MCH: 31.7 pg (ref 26.0–34.0)
MCHC: 30.5 g/dL (ref 30.0–36.0)
MCV: 103.9 fL — ABNORMAL HIGH (ref 80.0–100.0)
Monocytes Absolute: 0 10*3/uL — ABNORMAL LOW (ref 0.1–1.0)
Monocytes Relative: 1 %
Neutro Abs: 3.7 10*3/uL (ref 1.7–7.7)
Neutrophils Relative %: 93 %
Platelets: 126 10*3/uL — ABNORMAL LOW (ref 150–400)
RBC: 3.31 MIL/uL — ABNORMAL LOW (ref 3.87–5.11)
RDW: 13.3 % (ref 11.5–15.5)
WBC: 4 10*3/uL (ref 4.0–10.5)
nRBC: 0 % (ref 0.0–0.2)

## 2021-12-07 LAB — COMPREHENSIVE METABOLIC PANEL
ALT: 12 U/L (ref 0–44)
AST: 22 U/L (ref 15–41)
Albumin: 3.3 g/dL — ABNORMAL LOW (ref 3.5–5.0)
Alkaline Phosphatase: 36 U/L — ABNORMAL LOW (ref 38–126)
Anion gap: 4 — ABNORMAL LOW (ref 5–15)
BUN: 17 mg/dL (ref 8–23)
CO2: 34 mmol/L — ABNORMAL HIGH (ref 22–32)
Calcium: 8.6 mg/dL — ABNORMAL LOW (ref 8.9–10.3)
Chloride: 102 mmol/L (ref 98–111)
Creatinine, Ser: 0.51 mg/dL (ref 0.44–1.00)
GFR, Estimated: >60 mL/min (ref >60)
Glucose, Bld: 185 mg/dL — ABNORMAL HIGH (ref 70–99)
Potassium: 3.8 mmol/L (ref 3.5–5.1)
Sodium: 140 mmol/L (ref 135–145)
Total Bilirubin: 0.6 mg/dL (ref 0.3–1.2)
Total Protein: 6.6 g/dL (ref 6.5–8.1)

## 2021-12-07 LAB — PHOSPHORUS: Phosphorus: 3 mg/dL (ref 2.5–4.6)

## 2021-12-07 LAB — BLOOD GAS, VENOUS
Acid-Base Excess: 11.8 mmol/L — ABNORMAL HIGH (ref 0.0–2.0)
Bicarbonate: 38.5 mmol/L — ABNORMAL HIGH (ref 20.0–28.0)
O2 Saturation: 98.8 %
Patient temperature: 36.3
pCO2, Ven: 56 mmHg (ref 44–60)
pH, Ven: 7.44 — ABNORMAL HIGH (ref 7.25–7.43)
pO2, Ven: 82 mmHg — ABNORMAL HIGH (ref 32–45)

## 2021-12-07 LAB — MAGNESIUM: Magnesium: 2 mg/dL (ref 1.7–2.4)

## 2021-12-07 MED ORDER — MOMETASONE FURO-FORMOTEROL FUM 200-5 MCG/ACT IN AERO
2.0000 | INHALATION_SPRAY | Freq: Two times a day (BID) | RESPIRATORY_TRACT | Status: DC
  Administered 2021-12-07 – 2021-12-13 (×11): 2 via RESPIRATORY_TRACT
  Filled 2021-12-07: qty 8.8

## 2021-12-07 MED ORDER — ALPRAZOLAM 0.25 MG PO TABS
0.2500 mg | ORAL_TABLET | Freq: Three times a day (TID) | ORAL | Status: DC | PRN
  Administered 2021-12-07 – 2021-12-13 (×13): 0.25 mg via ORAL
  Filled 2021-12-07 (×13): qty 1

## 2021-12-07 MED ORDER — ENOXAPARIN SODIUM 40 MG/0.4ML IJ SOSY
40.0000 mg | PREFILLED_SYRINGE | Freq: Every day | INTRAMUSCULAR | Status: DC
  Administered 2021-12-07 – 2021-12-13 (×7): 40 mg via SUBCUTANEOUS
  Filled 2021-12-07 (×7): qty 0.4

## 2021-12-07 MED ORDER — GUAIFENESIN-DM 100-10 MG/5ML PO SYRP
10.0000 mL | ORAL_SOLUTION | ORAL | Status: DC | PRN
  Administered 2021-12-07 (×2): 10 mL via ORAL
  Filled 2021-12-07 (×2): qty 10

## 2021-12-07 MED ORDER — IPRATROPIUM-ALBUTEROL 0.5-2.5 (3) MG/3ML IN SOLN
3.0000 mL | Freq: Four times a day (QID) | RESPIRATORY_TRACT | Status: DC
  Administered 2021-12-07 (×2): 3 mL via RESPIRATORY_TRACT
  Filled 2021-12-07 (×2): qty 3

## 2021-12-07 MED ORDER — ALBUTEROL SULFATE (2.5 MG/3ML) 0.083% IN NEBU: 2.5000 mg | INHALATION_SOLUTION | RESPIRATORY_TRACT | Status: DC | PRN

## 2021-12-07 NOTE — Plan of Care (Signed)
  Problem: Education: Goal: Knowledge of General Education information will improve Description: Including pain rating scale, medication(s)/side effects and non-pharmacologic comfort measures Outcome: Progressing   Problem: Clinical Measurements: Goal: Diagnostic test results will improve Outcome: Progressing   Problem: Activity: Goal: Risk for activity intolerance will decrease Outcome: Progressing   Problem: Nutrition: Goal: Adequate nutrition will be maintained Outcome: Progressing   Problem: Elimination: Goal: Will not experience complications related to urinary retention Outcome: Progressing

## 2021-12-07 NOTE — Progress Notes (Signed)
Pt requested to come off bipap. Placed pt on 3L [place]. No distress noted.

## 2021-12-07 NOTE — Progress Notes (Addendum)
PROGRESS NOTE    [name]  KKX:[id number] DOB: 1930/07/30 DOA: 12/06/2021 PCP: Tonia Ghent, MD   Brief Narrative:  88 year old female with history of COPD, chronic respiratory failure with hypoxia on 2 L oxygen by nasal cannula, TIAs, chronic systolic heart failure, allergic rhinitis, chronic back pain, chronic anemia presented with worsening shortness of breath, fall and some confusion.  On presentation, patient required BiPAP.  High-sensitivity troponin was 14 and 12.  CT of the head without contrast showed no acute intracranial process.  CT cervical spine showed no acute cervical fracture or evidence of subluxation or injury.  X-ray of the pelvis and right femur x-ray showed no acute fracture.  Chest x-ray showed no evidence of acute cardiopulmonary process.  CTA chest, abdomen and pelvis showed unchanged plaque within the aortic arch, also showing 5 cm infrarenal abdominal aortic aneurysm without evidence of dissection or rupture, as well as 4.3 cm ascending thoracic aortic aneurysm and radiology recommending follow-up imaging in 6 months; enlarging right axillary lymphadenopathy was also seen with radiology recommending appropriate outpatient mammographic and sonographic follow-up.  ED provider discussed case with Dr. Lissa Morales surgery who recommended outpatient referral to vascular surgery after discharge.  Patient was started on nebs and IV Solu-Medrol.  Assessment & Plan:   Acute on chronic hypoxic and hypercapnic respiratory failure -Normally uses 3 L oxygen via nasal cannula at home.  Required BiPAP on presentation and was hypercapnic. -Imaging findings as above.  COVID-19 testing negative on presentation.  We will check respiratory virus PCR -Respiratory status improving.  Currently on 3 L oxygen by nasal cannula.  Continue nebs, Solu-Medrol, Zithromax.  Add Dulera  Acute metabolic encephalopathy -Possibly from above.  Mental status improving.  Monitor mental  status.  Fall precautions.  Still slow to respond.  Scheduled BuSpar, Cymbalta and as needed Xanax are on hold for now.  Generalized weakness Possible mechanical fall -PT evaluated further questions.  CT of the head and cervical spine did not show any acute intracranial or cervical abnormality.  Ascending thoracic aortic aneurysm/abdominal aortic aneurysm -Imaging findings as above.  Outpatient follow-up with vascular surgery as above.  Chronic systolic heart failure -Currently compensated.  Last echo in January 2020 had shown EF 40 to 45%.  Strict input and output.  Daily weights.  Fluid restriction.  If respiratory status does not improve, will check echo.  Otherwise outpatient cardiology follow-up.  History of CAD -continue Plavix  Cognitive impairment with possible dementia -Continue donepezil  Hypothyroidism -Continue levothyroxine  Breast cancer -Continue anastrozole.  Outpatient follow-up with oncology  Generalized anxiety disorder -BuSpar and as needed Xanax on hold.  Thrombocytopenia -questionable cause.  No signs of bleeding  Anemia of chronic disease -Hemoglobin currently stable.  Continue oral iron supplementation.  Physical deconditioning -PT eval  DVT prophylaxis: Start Lovenox Code Status: DNR Family Communication: Daughter at bedside Disposition Plan: Status is: Inpatient Remains inpatient appropriate because: Of severity of illness.  Need for IV steroids and respiratory status is still tenuous.    Consultants: None  Procedures: None  Antimicrobials: Zithromax from 12/06/2021 onwards   Subjective: Patient seen and examined at bedside.  Hard of hearing.  Feels slightly better but does not feel well enough.  Still short of breath with exertion.  No overnight fever, agitation, vomiting reported.  Daughter present at bedside states that mental status is slightly improving.  Objective: Vitals:   12/07/21 0700 12/07/21 0800 12/07/21 0806 12/07/21 0817   BP: (!) 154/84 106/61    Pulse:  75 75    Resp: 20 20    Temp:    98 F (36.7 C)  TempSrc:    Oral  SpO2: 98% 95% 99%   Weight:      Height:        Intake/Output Summary (Last 24 hours) at 12/07/2021 1005 Last data filed at 12/07/2021 0800 Gross per 24 hour  Intake --  Output 300 ml  Net -300 ml   Filed Weights   12/06/21 1625 12/06/21 2100  Weight: 61.7 kg 61.7 kg    Examination:  General exam: Appears calm and comfortable.  Currently on 3 L oxygen via nasal cannula.  No distress.  Hard of hearing.  Elderly female sitting on chair. Respiratory system: Bilateral decreased breath sounds at bases with some scattered crackles Cardiovascular system: S1 & S2 heard, Rate controlled Gastrointestinal system: Abdomen is nondistended, soft and nontender. Normal bowel sounds heard. Extremities: No cyanosis, clubbing; mild lower extremity edema present Central nervous system: Alert and oriented.  Slow to respond.  No focal neurological deficits. Moving extremities Skin: No rashes, lesions or ulcers Psychiatry: No signs of agitation.  Intermittently smiling.    Data Reviewed: I have personally reviewed following labs and imaging studies  CBC: Recent Labs  Lab 12/06/21 1651 12/07/21 0249  WBC 4.7 4.0  NEUTROABS 3.3 3.7  HGB 10.0* 10.5*  HCT 32.8* 34.4*  MCV 105.5* 103.9*  PLT 131* 458*   Basic Metabolic Panel: Recent Labs  Lab 12/06/21 1651 12/06/21 1854 12/07/21 0249  NA 144  --  140  K 3.7  --  3.8  CL 101  --  102  CO2 37*  --  34*  GLUCOSE 98  --  185*  BUN 19  --  17  CREATININE 0.53  --  0.51  CALCIUM 9.2  --  8.6*  MG  --  2.4 2.0  PHOS  --   --  3.0   GFR: Estimated Creatinine Clearance: 39.9 mL/min (by C-G formula based on SCr of 0.51 mg/dL). Liver Function Tests: Recent Labs  Lab 12/06/21 1651 12/07/21 0249  AST 22 22  ALT 11 12  ALKPHOS 36* 36*  BILITOT 1.0 0.6  PROT 6.7 6.6  ALBUMIN 3.5 3.3*   No results for input(s): LIPASE, AMYLASE in the  last 168 hours. No results for input(s): AMMONIA in the last 168 hours. Coagulation Profile: No results for input(s): INR, PROTIME in the last 168 hours. Cardiac Enzymes: Recent Labs  Lab 12/06/21 1651  CKTOTAL 17*   BNP (last 3 results) No results for input(s): PROBNP in the last 8760 hours. HbA1C: No results for input(s): HGBA1C in the last 72 hours. CBG: No results for input(s): GLUCAP in the last 168 hours. Lipid Profile: No results for input(s): CHOL, HDL, LDLCALC, TRIG, CHOLHDL, LDLDIRECT in the last 72 hours. Thyroid Function Tests: Recent Labs    12/06/21 2045  TSH 0.067*   Anemia Panel: Recent Labs    12/06/21 2030  FOLATE 22.3   Sepsis Labs: No results for input(s): PROCALCITON, LATICACIDVEN in the last 168 hours.  Recent Results (from the past 240 hour(s))  MRSA Next Gen by PCR, Nasal     Status: None   Collection Time: 12/06/21  9:03 PM   Specimen: Nasal Mucosa; Nasal Swab  Result Value Ref Range Status   MRSA by PCR Next Gen NOT DETECTED NOT DETECTED Final    Comment: (NOTE) The GeneXpert MRSA Assay (FDA approved for NASAL specimens only), is one component  of a comprehensive MRSA colonization surveillance program. It is not intended to diagnose MRSA infection nor to guide or monitor treatment for MRSA infections. Test performance is not FDA approved in patients less than 52 years old. Performed at [facility], [place] [address] [place], [place] [address]   SARS Coronavirus 2 by RT PCR (hospital order, performed in [facility] hospital lab) *cepheid single result test* Anterior Nasal Swab     Status: None   Collection Time: 12/06/21 10:00 PM   Specimen: Anterior Nasal Swab  Result Value Ref Range Status   SARS Coronavirus 2 by RT PCR NEGATIVE NEGATIVE Final    Comment: (NOTE) SARS-CoV-2 target nucleic acids are NOT DETECTED.  The SARS-CoV-2 RNA is generally detectable in upper and lower respiratory specimens during the acute phase  of infection. The lowest concentration of SARS-CoV-2 viral copies this assay can detect is 250 copies / mL. A negative result does not preclude SARS-CoV-2 infection and should not be used as the sole basis for treatment or other patient management decisions.  A negative result may occur with improper specimen collection / handling, submission of specimen other than nasopharyngeal swab, presence of viral mutation(s) within the areas targeted by this assay, and inadequate number of viral copies (<250 copies / mL). A negative result must be combined with clinical observations, patient history, and epidemiological information.  Fact Sheet for Patients:   https://www.patel.info/  Fact Sheet for Healthcare Providers: https://hall.com/  This test is not yet approved or  cleared by the [place] FDA and has been authorized for detection and/or diagnosis of SARS-CoV-2 by FDA under an Emergency Use Authorization (EUA).  This EUA will remain in effect (meaning this test can be used) for the duration of the COVID-19 declaration under Section 564(b)(1) of the Act, 21 U.S.C. section 360bbb-3(b)(1), unless the authorization is terminated or revoked sooner.  Performed at [facility], [place] [address], [place], [place] [address]          Radiology Studies: DG Chest 1 View  Result Date: 12/06/2021 CLINICAL DATA:  Fall last night, unwitnessed EXAM: CHEST  1 VIEW COMPARISON:  None Available. FINDINGS: Unchanged cardiomediastinal silhouette. No focal airspace disease. No large pleural effusion. No pneumothorax. There is possible nondisplaced right anterior tenth rib fracture. Left glenohumeral osteoarthritis. Prior right anatomic shoulder arthroplasty with glenoid remodeling and diminished acromial humeral space likely reflecting rotator cuff insufficiency. IMPRESSION: No acute cardiopulmonary disease. Possible right anterior tenth rib  fracture, correlate with point tenderness. Electronically Signed   By: Maurine Simmering M.D.   On: 12/06/2021 17:51   DG Pelvis 1-2 Views  Result Date: 12/06/2021 CLINICAL DATA:  Fall, left pelvic and right upper leg pain EXAM: PELVIS - 1-2 VIEW COMPARISON:  None Available. FINDINGS: There is no evidence of acute fracture on single frontal view of the pelvis. There is mild bilateral hip osteoarthritis. Mild bilateral SI joint degenerative change. IMPRESSION: No evidence of acute fracture on single frontal view of the pelvis. Electronically Signed   By: Maurine Simmering M.D.   On: 12/06/2021 17:52   CT HEAD WO CONTRAST (5MM)  Result Date: 12/06/2021 CLINICAL DATA:  Head trauma, intracranial venous injury suspected; Neck trauma (Age >= 65y) EXAM: CT HEAD WITHOUT CONTRAST CT CERVICAL SPINE WITHOUT CONTRAST TECHNIQUE: Multidetector CT imaging of the head and cervical spine was performed following the standard protocol without intravenous contrast. Multiplanar CT image reconstructions of the cervical spine were also generated. RADIATION DOSE REDUCTION: This exam was performed  according to the departmental dose-optimization program which includes automated exposure control, adjustment of the mA and/or kV according to patient size and/or use of iterative reconstruction technique. COMPARISON:  CT head and cervical spine 09/25/2021 FINDINGS: CT HEAD FINDINGS Brain: No evidence of acute intracranial hemorrhage.Unchanged bifrontal hygromas.The ventricles are unchanged in size.Confluent periventricular and subcortical white matter hypoattenuation, which is nonspecific but likely sequela of chronic small vessel ischemic disease.Moderate cerebral atrophy Vascular: No hyperdense vessel. Skull: Negative for skull fracture. Sinuses/Orbits: Trace mastoid effusions. Ethmoid air cell and sphenoid sinus mucosal thickening with some frothy material. Orbits are unremarkable. Other: None. CT CERVICAL SPINE FINDINGS Alignment: Normal. Skull  base and vertebrae: No acute fracture. No primary bone lesion or focal pathologic process. Soft tissues and spinal canal: No prevertebral fluid or swelling. No visible canal hematoma. Disc levels: There is mild multilevel degenerative disc disease worst at C4-C5 and C5-C6. There is mild multilevel facet arthropathy. Upper chest: Negative. Other: None. IMPRESSION: No acute intracranial abnormality. Unchanged chronic small vessel ischemic disease. No acute cervical spine fracture. Electronically Signed   By: Maurine Simmering M.D.   On: 12/06/2021 17:25   CT Cervical Spine Wo Contrast  Result Date: 12/06/2021 CLINICAL DATA:  Head trauma, intracranial venous injury suspected; Neck trauma (Age >= 65y) EXAM: CT HEAD WITHOUT CONTRAST CT CERVICAL SPINE WITHOUT CONTRAST TECHNIQUE: Multidetector CT imaging of the head and cervical spine was performed following the standard protocol without intravenous contrast. Multiplanar CT image reconstructions of the cervical spine were also generated. RADIATION DOSE REDUCTION: This exam was performed according to the departmental dose-optimization program which includes automated exposure control, adjustment of the mA and/or kV according to patient size and/or use of iterative reconstruction technique. COMPARISON:  CT head and cervical spine 09/25/2021 FINDINGS: CT HEAD FINDINGS Brain: No evidence of acute intracranial hemorrhage.Unchanged bifrontal hygromas.The ventricles are unchanged in size.Confluent periventricular and subcortical white matter hypoattenuation, which is nonspecific but likely sequela of chronic small vessel ischemic disease.Moderate cerebral atrophy Vascular: No hyperdense vessel. Skull: Negative for skull fracture. Sinuses/Orbits: Trace mastoid effusions. Ethmoid air cell and sphenoid sinus mucosal thickening with some frothy material. Orbits are unremarkable. Other: None. CT CERVICAL SPINE FINDINGS Alignment: Normal. Skull base and vertebrae: No acute fracture. No  primary bone lesion or focal pathologic process. Soft tissues and spinal canal: No prevertebral fluid or swelling. No visible canal hematoma. Disc levels: There is mild multilevel degenerative disc disease worst at C4-C5 and C5-C6. There is mild multilevel facet arthropathy. Upper chest: Negative. Other: None. IMPRESSION: No acute intracranial abnormality. Unchanged chronic small vessel ischemic disease. No acute cervical spine fracture. Electronically Signed   By: Maurine Simmering M.D.   On: 12/06/2021 17:25   CT Angio Chest/Abd/Pel for Dissection W and/or Wo Contrast  Result Date: 12/06/2021 CLINICAL DATA:  Fall, thoracic aortic aneurysm, suspected acute aortic syndrome, history of right breast cancer EXAM: CT ANGIOGRAPHY CHEST, ABDOMEN AND PELVIS TECHNIQUE: Non-contrast CT of the chest was initially obtained. Multidetector CT imaging through the chest, abdomen and pelvis was performed using the standard protocol during bolus administration of intravenous contrast. Multiplanar reconstructed images and MIPs were obtained and reviewed to evaluate the vascular anatomy. RADIATION DOSE REDUCTION: This exam was performed according to the departmental dose-optimization program which includes automated exposure control, adjustment of the mA and/or kV according to patient size and/or use of iterative reconstruction technique. CONTRAST:  124m OMNIPAQUE IOHEXOL 350 MG/ML SOLN COMPARISON:  12/06/2021, 09/14/2019 FINDINGS: CTA CHEST FINDINGS Cardiovascular: Ascending thoracic aortic aneurysm measures approximately 4.3  cm, not significantly changed since prior study. There is diffuse atherosclerosis of the aortic arch, with stable ulcerative atheromatous plaque within the proximal arch as before. No evidence of periaortic fat stranding to suggest leak, rupture, or inflammation. No evidence of dissection. While not optimized for opacification of the pulmonary vasculature, there is adequate opacification of the pulmonary artery.  No filling defects or pulmonary emboli. The heart is unremarkable without pericardial effusion. Stable coronary artery atherosclerosis. Mediastinum/Nodes: Progressive right axillary adenopathy, with largest lymph node measuring up to 16 mm in short axis reference image 13/6, previously measuring 10 mm. This is concerning for metastatic disease in a patient with a known history of right breast cancer. No pathologic mediastinal or hilar adenopathy. Thyroid gland, trachea, and esophagus demonstrate no significant findings. Lungs/Pleura: No acute airspace disease, effusion, or pneumothorax. Central airways are patent. Musculoskeletal: The bones are osteopenic. No acute displaced fractures. Partial visualization of a right shoulder arthroplasty. Chronic anterior wedging of the T3 vertebral body with less than 25% loss of height. Reconstructed images demonstrate no additional findings. Review of the MIP images confirms the above findings. CTA ABDOMEN AND PELVIS FINDINGS VASCULAR Aorta: Fusiform dilation of the abdominal aorta, with 5 cm infrarenal abdominal aortic aneurysm. Diffuse atherosclerosis. No evidence of dissection. No periaortic fat stranding to suggest leak or rupture. Celiac: Patent without evidence of aneurysm, dissection, vasculitis or significant stenosis. SMA: Patent without evidence of aneurysm, dissection, vasculitis or significant stenosis. Renals: Both renal arteries are patent without evidence of aneurysm, dissection, vasculitis, fibromuscular dysplasia or significant stenosis. Bilateral accessory renal arteries are incidentally noted. IMA: Patent without evidence of aneurysm, dissection, vasculitis or significant stenosis. Inflow: Patent without evidence of aneurysm, dissection, vasculitis or significant stenosis. Veins: No obvious venous abnormality within the limitations of this arterial phase study. Review of the MIP images confirms the above findings. NON-VASCULAR Hepatobiliary: No focal liver  abnormality is seen. No gallstones, gallbladder wall thickening, or biliary dilatation. Pancreas: Unremarkable. No pancreatic ductal dilatation or surrounding inflammatory changes. Spleen: Normal in size without focal abnormality. Adrenals/Urinary Tract: Adrenal glands are unremarkable. Kidneys are normal, without renal calculi, focal lesion, or hydronephrosis. Bladder is unremarkable. Stomach/Bowel: No bowel obstruction or ileus. No bowel wall thickening or inflammatory change. Lymphatic: No pathologic adenopathy within the abdomen or pelvis. Reproductive: Markedly enlarged fibroid uterus measures 16.4 x 15.4 x 14.0 cm. No adnexal masses. Other: No free fluid or free intraperitoneal gas. No abdominal wall hernia. Musculoskeletal: No acute or destructive bony lesions. Multilevel thoracolumbar spondylosis. Chronic T11 compression deformity. Reconstructed images demonstrate no additional findings. Review of the MIP images confirms the above findings. IMPRESSION: Vascular: 1. Ulcerative atheromatous plaque within the aortic arch, without significant change since prior study. No evidence of periaortic fat stranding to suggest inflammation, leak, or pending rupture. 2. 5 cm infrarenal abdominal aortic aneurysm, as well as a 4.3 cm ascending thoracic aortic aneurysm. Recommend follow-up every 6 months and vascular consultation. Reference: J Am Coll Radiol 4166;06:301-601. 3. No evidence of thoracoabdominal aortic dissection. 4. No evidence of pulmonary embolus. 5.  Aortic Atherosclerosis (ICD10-I70.0). Nonvascular: 1. Enlarging right axillary lymphadenopathy, concerning for metastatic disease in a patient with a known history of right breast cancer. Appropriate outpatient mammographic and sonographic follow-up recommended. 2. No acute intrathoracic, intra-abdominal, or intrapelvic trauma. 3. Stable enlarged fibroid uterus. Electronically Signed   By: Randa Ngo M.D.   On: 12/06/2021 19:14   DG FEMUR, MIN 2 VIEWS  RIGHT  Result Date: 12/06/2021 CLINICAL DATA:  Right leg pain EXAM:  RIGHT FEMUR 2 VIEWS COMPARISON:  None Available. FINDINGS: There is no evidence of acute fracture involving the right femur. There is a right total knee arthroplasty noted and normal alignment. No right knee joint effusion. There is mild right hip osteoarthritis. IMPRESSION: No evidence of acute fracture involving the right femur. Electronically Signed   By: Maurine Simmering M.D.   On: 12/06/2021 17:53        Scheduled Meds:  anastrozole  1 mg Oral Daily   chlorhexidine  15 mL Mouth Rinse BID   Chlorhexidine Gluconate Cloth  6 each Topical Daily   clopidogrel  75 mg Oral Daily   donepezil  5 mg Oral QHS   ferrous sulfate  325 mg Oral Q breakfast   ipratropium-albuterol  3 mL Nebulization TID   levothyroxine  112 mcg Oral Q0600   mouth rinse  15 mL Mouth Rinse q12n4p   methylPREDNISolone (SOLU-MEDROL) injection  80 mg Intravenous Q12H   Continuous Infusions:  azithromycin Stopped (12/06/21 2247)          Aline August, MD [facility] 12/07/2021, 10:05 AM

## 2021-12-07 NOTE — Progress Notes (Signed)
PT Cancellation Note  Patient Details Name: [name] MRN: [id number] DOB: 07-29-1930   Cancelled Treatment:    Reason Eval/Treat Not Completed: Fatigue/lethargy limiting ability to participate (pt in tears saying she's exhausted and in pain "all over", she refused PT. Will follow.)   Philomena Doheny PT 12/07/2021  Acute Rehabilitation Services  Office [phone]

## 2021-12-07 NOTE — Evaluation (Signed)
Occupational Therapy Evaluation Patient Details Name: [name] MRN: [id number] DOB: 12-22-1930 Today's Date: 12/07/2021   History of Present Illness 88 year old female with history of COPD, chronic respiratory failure with hypoxia on 2 L oxygen by nasal cannula, TIAs, chronic systolic heart failure, allergic rhinitis, chronic back pain, chronic anemia presented with worsening shortness of breath, fall and some confusion. Patietn admitted with exacerbation of COPD.   Clinical Impression   [name] is an 88 year old woman who presents with generalized weakness, decreased activity tolerance, poor balance and decreased cardiopulmonary endurance. At baseline she is grossly independent with ADLs but has a caregiver from 10-6 on the weekdays and her daughter comes by on the weekends. Overall she needed min assist for ambulation due to complaints of dizziness and increased assistance for LB ADLs. O2 s sats maintained in 90s on 3 L [place]. BP elevated with activity and was unable to ascertain BP with standing. Patient will benefit from skilled OT services while in hospital to improve deficits and learn compensatory strategies as needed in order to return to PLOF.  Expect patient to recover well and not have any OT needs at discharge.     Recommendations for follow up therapy are one component of a multi-disciplinary discharge planning process, led by the attending physician.  Recommendations may be updated based on patient status, additional functional criteria and insurance authorization.   Follow Up Recommendations  No OT follow up    Assistance Recommended at Discharge Intermittent Supervision/Assistance  Patient can return home with the following A little help with walking and/or transfers;A little help with bathing/dressing/bathroom;Assistance with cooking/housework;Direct supervision/assist for medications management;Direct supervision/assist for financial management;Assist for  transportation;Help with stairs or ramp for entrance    Functional Status Assessment  Patient has had a recent decline in their functional status and demonstrates the ability to make significant improvements in function in a reasonable and predictable amount of time.  Equipment Recommendations  None recommended by OT    Recommendations for Other Services       Precautions / Restrictions Precautions Precautions: Other (comment) Precaution Comments: monitor BP, dizziness with standing Restrictions Weight Bearing Restrictions: No      Mobility Bed Mobility Overal bed mobility: Needs Assistance Bed Mobility: Supine to Sit     Supine to sit: Supervision          Transfers Overall transfer level: Needs assistance Equipment used: Rolling walker (2 wheels) Transfers: Sit to/from Stand, Bed to chair/wheelchair/BSC Sit to Stand: Min guard     Step pivot transfers: Min assist     General transfer comment: Min assist for safety to transfer to recliner. Reports feeling whoozy with standing and ambulation. Took BP after transfer and BP 166/46. MD in room and asked therapist to "let her settle" so did not attempt BP in standing.      Balance Overall balance assessment: Needs assistance Sitting-balance support: No upper extremity supported Sitting balance-Leahy Scale: Fair     Standing balance support: During functional activity Standing balance-Leahy Scale: Poor                             ADL either performed or assessed with clinical judgement   ADL Overall ADL's : Needs assistance/impaired Eating/Feeding: Sitting;Independent   Grooming: Set up;Sitting   Upper Body Bathing: Set up;Sitting   Lower Body Bathing: Sitting/lateral leans;Moderate assistance   Upper Body Dressing : Set up;Sitting   Lower Body Dressing: Maximal  assistance;Sit to/from stand   Toilet Transfer: Minimal assistance;BSC/3in1   Toileting- Clothing Manipulation and Hygiene:  Minimal assistance;Sit to/from stand       Functional mobility during ADLs: Minimal assistance;Rolling walker (2 wheels)       Vision Patient Visual Report: No change from baseline       Perception     Praxis      Pertinent Vitals/Pain Pain Assessment Pain Assessment: No/denies pain     Hand Dominance Right   Extremity/Trunk Assessment Upper Extremity Assessment Upper Extremity Assessment: Overall WFL for tasks assessed   Lower Extremity Assessment Lower Extremity Assessment: Defer to PT evaluation   Cervical / Trunk Assessment Cervical / Trunk Assessment: Kyphotic   Communication Communication Communication: HOH   Cognition Arousal/Alertness: Awake/alert Behavior During Therapy: WFL for tasks assessed/performed Overall Cognitive Status: Within Functional Limits for tasks assessed                                       General Comments       Exercises     Shoulder Instructions      Home Living Family/patient expects to be discharged to:: Private residence Living Arrangements: Alone Available Help at Discharge: Family;Available PRN/intermittently;Personal care attendant (10- 6 M-F) Type of Home: House Home Access: Ramped entrance     Home Layout: One level     Bathroom Shower/Tub: Occupational psychologist: Handicapped height Bathroom Accessibility: Yes   Home Equipment: Rollator (4 wheels);BSC/3in1;Shower seat;Wheelchair - manual;Grab bars - toilet;Grab bars - tub/shower   Additional Comments: daughter stays friday or sunday night      Prior Functioning/Environment Prior Level of Function : Needs assist             Mobility Comments: walks with rollator ADLs Comments: doesn't drive, family does the cooking and cleaning, washes up at sink per family        OT Problem List: Decreased activity tolerance;Impaired balance (sitting and/or standing);Cardiopulmonary status limiting activity;Decreased strength      OT  Treatment/Interventions: Self-care/ADL training;Therapeutic exercise;Patient/family education;Balance training;Therapeutic activities;DME and/or AE instruction    OT Goals(Current goals can be found in the care plan section) Acute Rehab OT Goals Patient Stated Goal: to go home OT Goal Formulation: With patient Time For Goal Achievement: 12/21/21 Potential to Achieve Goals: Good  OT Frequency: Min 2X/week    Co-evaluation              AM-PAC OT "6 Clicks" Daily Activity     Outcome Measure Help from another person eating meals?: None Help from another person taking care of personal grooming?: A Little Help from another person toileting, which includes using toliet, bedpan, or urinal?: A Little Help from another person bathing (including washing, rinsing, drying)?: A Lot Help from another person to put on and taking off regular upper body clothing?: A Little Help from another person to put on and taking off regular lower body clothing?: A Lot 6 Click Score: 17   End of Session Equipment Utilized During Treatment: Rolling walker (2 wheels);Oxygen Nurse Communication: Mobility status  Activity Tolerance: Patient tolerated treatment well Patient left: in chair;with family/visitor present  OT Visit Diagnosis: Muscle weakness (generalized) (M62.81)                Time: 5956-3875 OT Time Calculation (min): 26 min Charges:  OT General Charges $OT Visit: 1 Visit OT Evaluation $OT Eval Low  Complexity: 1 Low  Allister Lessley, OTR/[place]  Office [phone] Pager: [place] 12/07/2021, 2:57 PM

## 2021-12-08 DIAGNOSIS — D649 Anemia, unspecified: Secondary | ICD-10-CM | POA: Diagnosis not present

## 2021-12-08 DIAGNOSIS — G9341 Metabolic encephalopathy: Secondary | ICD-10-CM | POA: Diagnosis not present

## 2021-12-08 DIAGNOSIS — I7143 Infrarenal abdominal aortic aneurysm, without rupture: Secondary | ICD-10-CM | POA: Diagnosis not present

## 2021-12-08 DIAGNOSIS — J441 Chronic obstructive pulmonary disease with (acute) exacerbation: Secondary | ICD-10-CM | POA: Diagnosis not present

## 2021-12-08 LAB — RESPIRATORY PANEL BY PCR

## 2021-12-08 LAB — CALCIUM, IONIZED: Calcium, Ionized, Serum: 5.1 mg/dL (ref 4.5–5.6)

## 2021-12-08 MED ORDER — IPRATROPIUM-ALBUTEROL 0.5-2.5 (3) MG/3ML IN SOLN
3.0000 mL | Freq: Three times a day (TID) | RESPIRATORY_TRACT | Status: DC
  Administered 2021-12-08 – 2021-12-12 (×12): 3 mL via RESPIRATORY_TRACT
  Filled 2021-12-08 (×13): qty 3

## 2021-12-08 MED ORDER — METHYLPREDNISOLONE SODIUM SUCC 125 MG IJ SOLR
80.0000 mg | Freq: Every day | INTRAMUSCULAR | Status: DC
  Administered 2021-12-08: 80 mg via INTRAVENOUS
  Filled 2021-12-08: qty 2

## 2021-12-08 MED ORDER — BUSPIRONE HCL 5 MG PO TABS
5.0000 mg | ORAL_TABLET | Freq: Two times a day (BID) | ORAL | Status: DC
  Administered 2021-12-08 – 2021-12-13 (×11): 5 mg via ORAL
  Filled 2021-12-08 (×11): qty 1

## 2021-12-08 MED ORDER — DULOXETINE HCL 60 MG PO CPEP
60.0000 mg | ORAL_CAPSULE | Freq: Every day | ORAL | Status: DC
  Administered 2021-12-08 – 2021-12-13 (×6): 60 mg via ORAL
  Filled 2021-12-08: qty 2
  Filled 2021-12-08 (×4): qty 1
  Filled 2021-12-08: qty 2

## 2021-12-08 MED ORDER — AZITHROMYCIN 250 MG PO TABS
500.0000 mg | ORAL_TABLET | Freq: Every day | ORAL | Status: DC
  Administered 2021-12-08 – 2021-12-10 (×3): 500 mg via ORAL
  Filled 2021-12-08 (×3): qty 2

## 2021-12-08 MED ORDER — BISACODYL 10 MG RE SUPP: 10.0000 mg | Freq: Every day | RECTAL | Status: DC | PRN

## 2021-12-08 MED ORDER — PROCHLORPERAZINE EDISYLATE 10 MG/2ML IJ SOLN: 5.0000 mg | Freq: Once | INTRAMUSCULAR | Status: DC

## 2021-12-08 MED ORDER — POLYETHYLENE GLYCOL 3350 17 G PO PACK: 17.0000 g | PACK | Freq: Every day | ORAL | Status: DC | PRN

## 2021-12-08 MED ORDER — SENNOSIDES-DOCUSATE SODIUM 8.6-50 MG PO TABS
1.0000 | ORAL_TABLET | Freq: Two times a day (BID) | ORAL | Status: DC
  Administered 2021-12-08 – 2021-12-13 (×8): 1 via ORAL
  Filled 2021-12-08 (×10): qty 1

## 2021-12-08 MED ORDER — MELATONIN 3 MG PO TABS
3.0000 mg | ORAL_TABLET | Freq: Once | ORAL | Status: AC
  Administered 2021-12-08: 3 mg via ORAL
  Filled 2021-12-08: qty 1

## 2021-12-08 NOTE — Evaluation (Signed)
Physical Therapy Evaluation Patient Details Name: [name] MRN: [id number] DOB: [birth year] Today's Date: 12/08/2021  History of Present Illness  88 year old female with history of COPD, chronic respiratory failure with hypoxia on 2 L oxygen by nasal cannula, TIAs, chronic systolic heart failure, allergic rhinitis, chronic back pain, chronic anemia presented with worsening shortness of breath, fall and some confusion. Patient admitted with exacerbation of COPD.  Clinical Impression  Pt admitted as above and presenting with functional mobility limitations 2* generalized weakness, balance deficits and decreased endurance.  This date, pt up to ambulate limited distance in hall and requiring assist for balance and to prevent falls particularly with initial standing.  Pt BP noted sitting 164/103, standing 110/83 with c/o dizziness and noted instability; after walking 50' 152/72 and after walking 200' 156/73 with noted improvement in stability.  Pt is currently at high risk for falls, does not have 24/7 assist at home and would benefit from follow up SNF level rehab to maximize IND and safety prior to return home.     Recommendations for follow up therapy are one component of a multi-disciplinary discharge planning process, led by the attending physician.  Recommendations may be updated based on patient status, additional functional criteria and insurance authorization.  Follow Up Recommendations Skilled nursing-short term rehab (<3 hours/day)    Assistance Recommended at Discharge Frequent or constant Supervision/Assistance  Patient can return home with the following  A little help with walking and/or transfers;A little help with bathing/dressing/bathroom;Assistance with cooking/housework;Assist for transportation;Help with stairs or ramp for entrance    Equipment Recommendations None recommended by PT  Recommendations for Other Services       Functional Status Assessment Patient has had  a recent decline in their functional status and demonstrates the ability to make significant improvements in function in a reasonable and predictable amount of time.     Precautions / Restrictions Precautions Precautions: Other (comment) Precaution Comments: monitor BP, dizziness with standing Restrictions Weight Bearing Restrictions: No      Mobility  Bed Mobility Overal bed mobility: Needs Assistance Bed Mobility: Supine to Sit     Supine to sit: Supervision          Transfers Overall transfer level: Needs assistance Equipment used: Rolling walker (2 wheels) Transfers: Sit to/from Stand Sit to Stand: Min assist           General transfer comment: min assist to balance in initial standing with pt retropulsive and c/o dizziness    Ambulation/Gait Ambulation/Gait assistance: Min assist Gait Distance (Feet): 200 Feet Assistive device: Rolling walker (2 wheels) Gait Pattern/deviations: Step-to pattern, Step-through pattern, Decreased step length - right, Decreased step length - left, Shuffle, Trunk flexed, Narrow base of support Gait velocity: decr     General Gait Details: cues for posture, position from RW and to widen BOS for balance  Stairs            Wheelchair Mobility    Modified Rankin (Stroke Patients Only)       Balance Overall balance assessment: Needs assistance Sitting-balance support: No upper extremity supported Sitting balance-Leahy Scale: Good     Standing balance support: Bilateral upper extremity supported Standing balance-Leahy Scale: Poor                               Pertinent Vitals/Pain Pain Assessment Pain Assessment: No/denies pain    Home Living Family/patient expects to be discharged to:: Private residence Living Arrangements:  Alone Available Help at Discharge: Family;Available PRN/intermittently;Personal care attendant Type of Home: House Home Access: Ramped entrance       Home Layout: One  level Home Equipment: Rollator (4 wheels);BSC/3in1;Shower seat;Wheelchair - manual;Grab bars - toilet;Grab bars - tub/shower Additional Comments: daughter stays friday or sunday night    Prior Function Prior Level of Function : Needs assist             Mobility Comments: walks with rollator ADLs Comments: doesn't drive, family does the cooking and cleaning, washes up at sink per family     Hand Dominance   Dominant Hand: Right    Extremity/Trunk Assessment   Upper Extremity Assessment Upper Extremity Assessment: Overall WFL for tasks assessed    Lower Extremity Assessment Lower Extremity Assessment: Generalized weakness    Cervical / Trunk Assessment Cervical / Trunk Assessment: Kyphotic  Communication   Communication: HOH  Cognition Arousal/Alertness: Awake/alert Behavior During Therapy: WFL for tasks assessed/performed Overall Cognitive Status: Within Functional Limits for tasks assessed                                 General Comments: Pt admits to memory deficits        General Comments      Exercises     Assessment/Plan    PT Assessment Patient needs continued PT services  PT Problem List Decreased activity tolerance;Decreased balance;Decreased mobility;Decreased knowledge of use of DME       PT Treatment Interventions DME instruction;Gait training;Functional mobility training;Therapeutic activities;Therapeutic exercise;Balance training;Patient/family education    PT Goals (Current goals can be found in the Care Plan section)  Acute Rehab PT Goals Patient Stated Goal: REgain IND PT Goal Formulation: With patient Time For Goal Achievement: 12/22/21 Potential to Achieve Goals: Fair    Frequency Min 3X/week     Co-evaluation               AM-PAC PT "6 Clicks" Mobility  Outcome Measure Help needed turning from your back to your side while in a flat bed without using bedrails?: None Help needed moving from lying on your back  to sitting on the side of a flat bed without using bedrails?: None Help needed moving to and from a bed to a chair (including a wheelchair)?: A Little Help needed standing up from a chair using your arms (e.g., wheelchair or bedside chair)?: A Little Help needed to walk in hospital room?: A Little Help needed climbing 3-5 steps with a railing? : A Lot 6 Click Score: 19    End of Session Equipment Utilized During Treatment: Gait belt;Oxygen Activity Tolerance: Patient tolerated treatment well Patient left: in chair;with call bell/phone within reach;with family/visitor present;with chair alarm set Nurse Communication: Mobility status PT Visit Diagnosis: Unsteadiness on feet (R26.81);History of falling (Z91.81);Difficulty in walking, not elsewhere classified (R26.2);Dizziness and giddiness (R42)    Time: 7948-0165 PT Time Calculation (min) (ACUTE ONLY): 28 min   Charges:   PT Evaluation $PT Eval Low Complexity: 1 Low PT Treatments $Gait Training: 8-22 mins        Debe Coder PT Acute Rehabilitation Services Pager [phone] Office [phone]   Braiden Rodman 12/08/2021, 10:38 AM

## 2021-12-08 NOTE — Progress Notes (Signed)
PHARMACIST - PHYSICIAN COMMUNICATION DR:   Starla Link CONCERNING: Antibiotic IV to Oral Route Change Policy  RECOMMENDATION: This patient is receiving azithromycin by the intravenous route.  Based on criteria approved by the Pharmacy and Therapeutics Committee, the antibiotic(s) is/are being converted to the equivalent oral dose form(s).   DESCRIPTION: These criteria include: Patient being treated for a respiratory tract infection, urinary tract infection, cellulitis or clostridium difficile associated diarrhea if on metronidazole The patient is not neutropenic and does not exhibit a GI malabsorption state The patient is eating (either orally or via tube) and/or has been taking other orally administered medications for a least 24 hours The patient is improving clinically and has a Tmax < 100.5  If you have questions about this conversion, please contact the Pharmacy Department  '[]'$   [phone] )  [facility] '[]'$   [phone] )  [facility]  '[]'$   [phone] )  [facility] '[x]'$   [phone] )  [place], PharmD, [place] Please utilize Amion for appropriate phone number to reach the unit pharmacist ([place]) 12/08/2021 11:10 AM

## 2021-12-08 NOTE — Consult Note (Signed)
[facility] CM Inpatient Consult  12/08/2021  Reyann Troop 1931/01/28 [id number]  [place] [facility] CM)   Patient chart has been reviewed with noted high risk score for unplanned readmissions.  Patient assessed for community [facility] follow up needs.   Primary Care Provider:  Tonia Ghent, MD  at [facility] at [facility] is an embedded provider with a Chronic Care Management team and program and is listed for the transition of care follow up and appointments.   Patient has been active in the Embedded CCM program with [facility] Embedded social worker and with Embedded pharmacist noted.   Plan: Will continue to follow for progression and disposition.  Of note, [facility] Care Management services does not replace or interfere with any services that are arranged by inpatient case management or social work.    Netta Cedars, MSN, RN [facility] Liaison Toll free office [phone]

## 2021-12-08 NOTE — Progress Notes (Signed)
PROGRESS NOTE    [name]  UXN:[id number] DOB: 01/01/1931 DOA: 12/06/2021 PCP: Tonia Ghent, MD   Brief Narrative:  88 year old female with history of COPD, chronic respiratory failure with hypoxia on 2 L oxygen by nasal cannula, TIAs, chronic systolic heart failure, allergic rhinitis, chronic back pain, chronic anemia presented with worsening shortness of breath, fall and some confusion.  On presentation, patient required BiPAP.  High-sensitivity troponin was 14 and 12.  CT of the head without contrast showed no acute intracranial process.  CT cervical spine showed no acute cervical fracture or evidence of subluxation or injury.  X-ray of the pelvis and right femur x-ray showed no acute fracture.  Chest x-ray showed no evidence of acute cardiopulmonary process.  CTA chest, abdomen and pelvis showed unchanged plaque within the aortic arch, also showing 5 cm infrarenal abdominal aortic aneurysm without evidence of dissection or rupture, as well as 4.3 cm ascending thoracic aortic aneurysm and radiology recommending follow-up imaging in 6 months; enlarging right axillary lymphadenopathy was also seen with radiology recommending appropriate outpatient mammographic and sonographic follow-up.  ED provider discussed case with Dr. Lissa Morales surgery who recommended outpatient referral to vascular surgery after discharge.  Patient was started on nebs and IV Solu-Medrol.  Assessment & Plan:   Acute on chronic hypoxic and hypercapnic respiratory failure COPD exacerbation -Normally uses 3 L oxygen via nasal cannula at home.  Required BiPAP on presentation and was hypercapnic. -Imaging findings as above.  COVID-19 testing negative on presentation.  We will check respiratory virus PCR -Respiratory status improving.  Currently on 3 L oxygen by nasal cannula.  Continue nebs, Zithromax and Dulera.  Decrease Solu-Medrol to 80 mg IV daily.  Acute metabolic encephalopathy -Possibly from above.   Mental status improving.  Monitor mental status.  Fall precautions.  Resume scheduled BuSpar and Cymbalta.  Xanax resumed at a lower dose   Generalized weakness Possible mechanical fall -PT evaluation.  CT of the head and cervical spine did not show any acute intracranial or cervical abnormality.  Ascending thoracic aortic aneurysm/abdominal aortic aneurysm -Imaging findings as above.  Outpatient follow-up with vascular surgery as above.  Chronic systolic heart failure -Currently compensated.  Last echo in January 2020 had shown EF 40 to 45%.  Strict input and output.  Daily weights.  Fluid restriction. outpatient cardiology follow-up.  History of CAD -continue Plavix  Cognitive impairment with possible dementia -Continue donepezil  Hypothyroidism -Continue levothyroxine  Breast cancer -Continue anastrozole.  Outpatient follow-up with oncology  Generalized anxiety disorder -BuSpar and Cymbalta plan as above.  Thrombocytopenia -questionable cause.  No signs of bleeding  Anemia of chronic disease -Hemoglobin currently stable.  Continue oral iron supplementation.  Physical deconditioning -PT eval  DVT prophylaxis: Lovenox Code Status: DNR Family Communication: Daughter at bedside Disposition Plan: Status is: Inpatient Remains inpatient appropriate because: Of severity of illness.  Need for IV steroids possible discharge in 1 to 2 days if clinically improves.  PT evaluation pending.   Consultants: None  Procedures: None  Antimicrobials: Zithromax from 12/06/2021 onwards   Subjective: Patient seen and examined at bedside.  Hard of hearing.  Still feels short of breath with minimal exertion.  Does not feel well.  Intermittently crying.  No overnight fever, vomiting, seizures reported.  Daughter present at bedside   Objective: Vitals:   12/08/21 0251 12/08/21 0400 12/08/21 0500 12/08/21 0700  BP:    (!) 172/65  Pulse: 72   70  Resp: 16   16  Temp:  98.4 F (36.9  C)    TempSrc:  Oral    SpO2: 98%   98%  Weight:   62.9 kg   Height:        Intake/Output Summary (Last 24 hours) at 12/08/2021 0732 Last data filed at 12/08/2021 0200 Gross per 24 hour  Intake 890.46 ml  Output 1200 ml  Net -309.54 ml    Filed Weights   12/06/21 1625 12/06/21 2100 12/08/21 0500  Weight: 61.7 kg 61.7 kg 62.9 kg    Examination:  General: On 3 L oxygen via nasal cannula.  No distress.  Elderly female lying in bed.  Hard of hearing ENT/neck: No thyromegaly.  JVD is not elevated  respiratory: Decreased breath sounds at bases bilaterally with some crackles; no wheezing  CVS: S1-S2 heard, rate controlled currently Abdominal: Soft, nontender, slightly distended; no organomegaly, normal bowel sounds are heard Extremities: Trace lower extremity edema; no cyanosis  CNS: Awake and alert.  Slow to respond.  No focal neurologic deficit.  Moves extremities Lymph: No obvious lymphadenopathy Skin: No obvious ecchymosis/lesions  psych: Currently not agitated.  Intermittently smiles musculoskeletal: No obvious joint swelling/deformity     Data Reviewed: I have personally reviewed following labs and imaging studies  CBC: Recent Labs  Lab 12/06/21 1651 12/07/21 0249  WBC 4.7 4.0  NEUTROABS 3.3 3.7  HGB 10.0* 10.5*  HCT 32.8* 34.4*  MCV 105.5* 103.9*  PLT 131* 126*    Basic Metabolic Panel: Recent Labs  Lab 12/06/21 1651 12/06/21 1854 12/07/21 0249  NA 144  --  140  K 3.7  --  3.8  CL 101  --  102  CO2 37*  --  34*  GLUCOSE 98  --  185*  BUN 19  --  17  CREATININE 0.53  --  0.51  CALCIUM 9.2  --  8.6*  MG  --  2.4 2.0  PHOS  --   --  3.0    GFR: Estimated Creatinine Clearance: 40.3 mL/min (by C-G formula based on SCr of 0.51 mg/dL). Liver Function Tests: Recent Labs  Lab 12/06/21 1651 12/07/21 0249  AST 22 22  ALT 11 12  ALKPHOS 36* 36*  BILITOT 1.0 0.6  PROT 6.7 6.6  ALBUMIN 3.5 3.3*    No results for input(s): LIPASE, AMYLASE in the last  168 hours. No results for input(s): AMMONIA in the last 168 hours. Coagulation Profile: No results for input(s): INR, PROTIME in the last 168 hours. Cardiac Enzymes: Recent Labs  Lab 12/06/21 1651  CKTOTAL 17*    BNP (last 3 results) No results for input(s): PROBNP in the last 8760 hours. HbA1C: No results for input(s): HGBA1C in the last 72 hours. CBG: No results for input(s): GLUCAP in the last 168 hours. Lipid Profile: No results for input(s): CHOL, HDL, LDLCALC, TRIG, CHOLHDL, LDLDIRECT in the last 72 hours. Thyroid Function Tests: Recent Labs    12/06/21 2045  TSH 0.067*    Anemia Panel: Recent Labs    12/06/21 2030  FOLATE 22.3    Sepsis Labs: No results for input(s): PROCALCITON, LATICACIDVEN in the last 168 hours.  Recent Results (from the past 240 hour(s))  MRSA Next Gen by PCR, Nasal     Status: None   Collection Time: 12/06/21  9:03 PM   Specimen: Nasal Mucosa; Nasal Swab  Result Value Ref Range Status   MRSA by PCR Next Gen NOT DETECTED NOT DETECTED Final    Comment: (NOTE) The GeneXpert MRSA Assay (FDA  approved for NASAL specimens only), is one component of a comprehensive MRSA colonization surveillance program. It is not intended to diagnose MRSA infection nor to guide or monitor treatment for MRSA infections. Test performance is not FDA approved in patients less than 36 years old. Performed at [facility], [place] [address] [place], [place] [address]   SARS Coronavirus 2 by RT PCR (hospital order, performed in [facility] hospital lab) *cepheid single result test* Anterior Nasal Swab     Status: None   Collection Time: 12/06/21 10:00 PM   Specimen: Anterior Nasal Swab  Result Value Ref Range Status   SARS Coronavirus 2 by RT PCR NEGATIVE NEGATIVE Final    Comment: (NOTE) SARS-CoV-2 target nucleic acids are NOT DETECTED.  The SARS-CoV-2 RNA is generally detectable in upper and lower respiratory specimens during the acute  phase of infection. The lowest concentration of SARS-CoV-2 viral copies this assay can detect is 250 copies / mL. A negative result does not preclude SARS-CoV-2 infection and should not be used as the sole basis for treatment or other patient management decisions.  A negative result may occur with improper specimen collection / handling, submission of specimen other than nasopharyngeal swab, presence of viral mutation(s) within the areas targeted by this assay, and inadequate number of viral copies (<250 copies / mL). A negative result must be combined with clinical observations, patient history, and epidemiological information.  Fact Sheet for Patients:   https://www.patel.info/  Fact Sheet for Healthcare Providers: https://hall.com/  This test is not yet approved or  cleared by the [place] FDA and has been authorized for detection and/or diagnosis of SARS-CoV-2 by FDA under an Emergency Use Authorization (EUA).  This EUA will remain in effect (meaning this test can be used) for the duration of the COVID-19 declaration under Section 564(b)(1) of the Act, 21 U.S.C. section 360bbb-3(b)(1), unless the authorization is terminated or revoked sooner.  Performed at [facility], [place] [address] [place], [place] [address]   Respiratory (~20 pathogens) panel by PCR     Status: None   Collection Time: 12/07/21 10:29 AM   Specimen: Nasopharyngeal Swab; Respiratory  Result Value Ref Range Status   Adenovirus NOT DETECTED NOT DETECTED Final   Coronavirus 229E NOT DETECTED NOT DETECTED Final    Comment: (NOTE) The Coronavirus on the Respiratory Panel, DOES NOT test for the novel  Coronavirus (2019 nCoV)    Coronavirus HKU1 NOT DETECTED NOT DETECTED Final   Coronavirus NL63 NOT DETECTED NOT DETECTED Final   Coronavirus OC43 NOT DETECTED NOT DETECTED Final   Metapneumovirus NOT DETECTED NOT DETECTED Final   Rhinovirus /  Enterovirus NOT DETECTED NOT DETECTED Final   Influenza A NOT DETECTED NOT DETECTED Final   Influenza B NOT DETECTED NOT DETECTED Final   Parainfluenza Virus 1 NOT DETECTED NOT DETECTED Final   Parainfluenza Virus 2 NOT DETECTED NOT DETECTED Final   Parainfluenza Virus 3 NOT DETECTED NOT DETECTED Final   Parainfluenza Virus 4 NOT DETECTED NOT DETECTED Final   Respiratory Syncytial Virus NOT DETECTED NOT DETECTED Final   Bordetella pertussis NOT DETECTED NOT DETECTED Final   Bordetella Parapertussis NOT DETECTED NOT DETECTED Final   Chlamydophila pneumoniae NOT DETECTED NOT DETECTED Final   Mycoplasma pneumoniae NOT DETECTED NOT DETECTED Final    Comment: Performed at [facility], [place], [place], [place] [address]          Radiology Studies: DG Chest 1 View  Result Date:  12/06/2021 CLINICAL DATA:  Fall last night, unwitnessed EXAM: CHEST  1 VIEW COMPARISON:  None Available. FINDINGS: Unchanged cardiomediastinal silhouette. No focal airspace disease. No large pleural effusion. No pneumothorax. There is possible nondisplaced right anterior tenth rib fracture. Left glenohumeral osteoarthritis. Prior right anatomic shoulder arthroplasty with glenoid remodeling and diminished acromial humeral space likely reflecting rotator cuff insufficiency. IMPRESSION: No acute cardiopulmonary disease. Possible right anterior tenth rib fracture, correlate with point tenderness. Electronically Signed   By: Maurine Simmering M.D.   On: 12/06/2021 17:51   DG Pelvis 1-2 Views  Result Date: 12/06/2021 CLINICAL DATA:  Fall, left pelvic and right upper leg pain EXAM: PELVIS - 1-2 VIEW COMPARISON:  None Available. FINDINGS: There is no evidence of acute fracture on single frontal view of the pelvis. There is mild bilateral hip osteoarthritis. Mild bilateral SI joint degenerative change. IMPRESSION: No evidence of acute fracture on single frontal view of the pelvis. Electronically Signed   By: Maurine Simmering  M.D.   On: 12/06/2021 17:52   CT HEAD WO CONTRAST (5MM)  Result Date: 12/06/2021 CLINICAL DATA:  Head trauma, intracranial venous injury suspected; Neck trauma (Age >= 65y) EXAM: CT HEAD WITHOUT CONTRAST CT CERVICAL SPINE WITHOUT CONTRAST TECHNIQUE: Multidetector CT imaging of the head and cervical spine was performed following the standard protocol without intravenous contrast. Multiplanar CT image reconstructions of the cervical spine were also generated. RADIATION DOSE REDUCTION: This exam was performed according to the departmental dose-optimization program which includes automated exposure control, adjustment of the mA and/or kV according to patient size and/or use of iterative reconstruction technique. COMPARISON:  CT head and cervical spine 09/25/2021 FINDINGS: CT HEAD FINDINGS Brain: No evidence of acute intracranial hemorrhage.Unchanged bifrontal hygromas.The ventricles are unchanged in size.Confluent periventricular and subcortical white matter hypoattenuation, which is nonspecific but likely sequela of chronic small vessel ischemic disease.Moderate cerebral atrophy Vascular: No hyperdense vessel. Skull: Negative for skull fracture. Sinuses/Orbits: Trace mastoid effusions. Ethmoid air cell and sphenoid sinus mucosal thickening with some frothy material. Orbits are unremarkable. Other: None. CT CERVICAL SPINE FINDINGS Alignment: Normal. Skull base and vertebrae: No acute fracture. No primary bone lesion or focal pathologic process. Soft tissues and spinal canal: No prevertebral fluid or swelling. No visible canal hematoma. Disc levels: There is mild multilevel degenerative disc disease worst at C4-C5 and C5-C6. There is mild multilevel facet arthropathy. Upper chest: Negative. Other: None. IMPRESSION: No acute intracranial abnormality. Unchanged chronic small vessel ischemic disease. No acute cervical spine fracture. Electronically Signed   By: Maurine Simmering M.D.   On: 12/06/2021 17:25   CT Cervical  Spine Wo Contrast  Result Date: 12/06/2021 CLINICAL DATA:  Head trauma, intracranial venous injury suspected; Neck trauma (Age >= 65y) EXAM: CT HEAD WITHOUT CONTRAST CT CERVICAL SPINE WITHOUT CONTRAST TECHNIQUE: Multidetector CT imaging of the head and cervical spine was performed following the standard protocol without intravenous contrast. Multiplanar CT image reconstructions of the cervical spine were also generated. RADIATION DOSE REDUCTION: This exam was performed according to the departmental dose-optimization program which includes automated exposure control, adjustment of the mA and/or kV according to patient size and/or use of iterative reconstruction technique. COMPARISON:  CT head and cervical spine 09/25/2021 FINDINGS: CT HEAD FINDINGS Brain: No evidence of acute intracranial hemorrhage.Unchanged bifrontal hygromas.The ventricles are unchanged in size.Confluent periventricular and subcortical white matter hypoattenuation, which is nonspecific but likely sequela of chronic small vessel ischemic disease.Moderate cerebral atrophy Vascular: No hyperdense vessel. Skull: Negative for skull fracture. Sinuses/Orbits: Trace mastoid effusions. Ethmoid  air cell and sphenoid sinus mucosal thickening with some frothy material. Orbits are unremarkable. Other: None. CT CERVICAL SPINE FINDINGS Alignment: Normal. Skull base and vertebrae: No acute fracture. No primary bone lesion or focal pathologic process. Soft tissues and spinal canal: No prevertebral fluid or swelling. No visible canal hematoma. Disc levels: There is mild multilevel degenerative disc disease worst at C4-C5 and C5-C6. There is mild multilevel facet arthropathy. Upper chest: Negative. Other: None. IMPRESSION: No acute intracranial abnormality. Unchanged chronic small vessel ischemic disease. No acute cervical spine fracture. Electronically Signed   By: Maurine Simmering M.D.   On: 12/06/2021 17:25   CT Angio Chest/Abd/Pel for Dissection W and/or Wo  Contrast  Result Date: 12/06/2021 CLINICAL DATA:  Fall, thoracic aortic aneurysm, suspected acute aortic syndrome, history of right breast cancer EXAM: CT ANGIOGRAPHY CHEST, ABDOMEN AND PELVIS TECHNIQUE: Non-contrast CT of the chest was initially obtained. Multidetector CT imaging through the chest, abdomen and pelvis was performed using the standard protocol during bolus administration of intravenous contrast. Multiplanar reconstructed images and MIPs were obtained and reviewed to evaluate the vascular anatomy. RADIATION DOSE REDUCTION: This exam was performed according to the departmental dose-optimization program which includes automated exposure control, adjustment of the mA and/or kV according to patient size and/or use of iterative reconstruction technique. CONTRAST:  18m OMNIPAQUE IOHEXOL 350 MG/ML SOLN COMPARISON:  12/06/2021, 09/14/2019 FINDINGS: CTA CHEST FINDINGS Cardiovascular: Ascending thoracic aortic aneurysm measures approximately 4.3 cm, not significantly changed since prior study. There is diffuse atherosclerosis of the aortic arch, with stable ulcerative atheromatous plaque within the proximal arch as before. No evidence of periaortic fat stranding to suggest leak, rupture, or inflammation. No evidence of dissection. While not optimized for opacification of the pulmonary vasculature, there is adequate opacification of the pulmonary artery. No filling defects or pulmonary emboli. The heart is unremarkable without pericardial effusion. Stable coronary artery atherosclerosis. Mediastinum/Nodes: Progressive right axillary adenopathy, with largest lymph node measuring up to 16 mm in short axis reference image 13/6, previously measuring 10 mm. This is concerning for metastatic disease in a patient with a known history of right breast cancer. No pathologic mediastinal or hilar adenopathy. Thyroid gland, trachea, and esophagus demonstrate no significant findings. Lungs/Pleura: No acute airspace  disease, effusion, or pneumothorax. Central airways are patent. Musculoskeletal: The bones are osteopenic. No acute displaced fractures. Partial visualization of a right shoulder arthroplasty. Chronic anterior wedging of the T3 vertebral body with less than 25% loss of height. Reconstructed images demonstrate no additional findings. Review of the MIP images confirms the above findings. CTA ABDOMEN AND PELVIS FINDINGS VASCULAR Aorta: Fusiform dilation of the abdominal aorta, with 5 cm infrarenal abdominal aortic aneurysm. Diffuse atherosclerosis. No evidence of dissection. No periaortic fat stranding to suggest leak or rupture. Celiac: Patent without evidence of aneurysm, dissection, vasculitis or significant stenosis. SMA: Patent without evidence of aneurysm, dissection, vasculitis or significant stenosis. Renals: Both renal arteries are patent without evidence of aneurysm, dissection, vasculitis, fibromuscular dysplasia or significant stenosis. Bilateral accessory renal arteries are incidentally noted. IMA: Patent without evidence of aneurysm, dissection, vasculitis or significant stenosis. Inflow: Patent without evidence of aneurysm, dissection, vasculitis or significant stenosis. Veins: No obvious venous abnormality within the limitations of this arterial phase study. Review of the MIP images confirms the above findings. NON-VASCULAR Hepatobiliary: No focal liver abnormality is seen. No gallstones, gallbladder wall thickening, or biliary dilatation. Pancreas: Unremarkable. No pancreatic ductal dilatation or surrounding inflammatory changes. Spleen: Normal in size without focal abnormality. Adrenals/Urinary Tract: Adrenal glands  are unremarkable. Kidneys are normal, without renal calculi, focal lesion, or hydronephrosis. Bladder is unremarkable. Stomach/Bowel: No bowel obstruction or ileus. No bowel wall thickening or inflammatory change. Lymphatic: No pathologic adenopathy within the abdomen or pelvis.  Reproductive: Markedly enlarged fibroid uterus measures 16.4 x 15.4 x 14.0 cm. No adnexal masses. Other: No free fluid or free intraperitoneal gas. No abdominal wall hernia. Musculoskeletal: No acute or destructive bony lesions. Multilevel thoracolumbar spondylosis. Chronic T11 compression deformity. Reconstructed images demonstrate no additional findings. Review of the MIP images confirms the above findings. IMPRESSION: Vascular: 1. Ulcerative atheromatous plaque within the aortic arch, without significant change since prior study. No evidence of periaortic fat stranding to suggest inflammation, leak, or pending rupture. 2. 5 cm infrarenal abdominal aortic aneurysm, as well as a 4.3 cm ascending thoracic aortic aneurysm. Recommend follow-up every 6 months and vascular consultation. Reference: J Am Coll Radiol 8242;35:361-443. 3. No evidence of thoracoabdominal aortic dissection. 4. No evidence of pulmonary embolus. 5.  Aortic Atherosclerosis (ICD10-I70.0). Nonvascular: 1. Enlarging right axillary lymphadenopathy, concerning for metastatic disease in a patient with a known history of right breast cancer. Appropriate outpatient mammographic and sonographic follow-up recommended. 2. No acute intrathoracic, intra-abdominal, or intrapelvic trauma. 3. Stable enlarged fibroid uterus. Electronically Signed   By: Randa Ngo M.D.   On: 12/06/2021 19:14   DG FEMUR, MIN 2 VIEWS RIGHT  Result Date: 12/06/2021 CLINICAL DATA:  Right leg pain EXAM: RIGHT FEMUR 2 VIEWS COMPARISON:  None Available. FINDINGS: There is no evidence of acute fracture involving the right femur. There is a right total knee arthroplasty noted and normal alignment. No right knee joint effusion. There is mild right hip osteoarthritis. IMPRESSION: No evidence of acute fracture involving the right femur. Electronically Signed   By: Maurine Simmering M.D.   On: 12/06/2021 17:53        Scheduled Meds:  anastrozole  1 mg Oral Daily   Chlorhexidine  Gluconate Cloth  6 each Topical Daily   clopidogrel  75 mg Oral Daily   donepezil  5 mg Oral QHS   enoxaparin (LOVENOX) injection  40 mg Subcutaneous Daily   ferrous sulfate  325 mg Oral Q breakfast   ipratropium-albuterol  3 mL Nebulization TID   levothyroxine  112 mcg Oral Q0600   mouth rinse  15 mL Mouth Rinse q12n4p   methylPREDNISolone (SOLU-MEDROL) injection  80 mg Intravenous Q12H   mometasone-formoterol  2 puff Inhalation BID   Continuous Infusions:  azithromycin Stopped (12/07/21 2111)          Aline August, MD [facility] 12/08/2021, 7:32 AM

## 2021-12-09 DIAGNOSIS — D649 Anemia, unspecified: Secondary | ICD-10-CM | POA: Diagnosis not present

## 2021-12-09 DIAGNOSIS — J441 Chronic obstructive pulmonary disease with (acute) exacerbation: Secondary | ICD-10-CM | POA: Diagnosis not present

## 2021-12-09 DIAGNOSIS — G9341 Metabolic encephalopathy: Secondary | ICD-10-CM | POA: Diagnosis not present

## 2021-12-09 DIAGNOSIS — I7143 Infrarenal abdominal aortic aneurysm, without rupture: Secondary | ICD-10-CM | POA: Diagnosis not present

## 2021-12-09 LAB — BASIC METABOLIC PANEL
Anion gap: 7 (ref 5–15)
BUN: 20 mg/dL (ref 8–23)
CO2: 34 mmol/L — ABNORMAL HIGH (ref 22–32)
Calcium: 8.7 mg/dL — ABNORMAL LOW (ref 8.9–10.3)
Chloride: 99 mmol/L (ref 98–111)
Creatinine, Ser: 0.65 mg/dL (ref 0.44–1.00)
GFR, Estimated: >60 mL/min (ref >60)
Glucose, Bld: 126 mg/dL — ABNORMAL HIGH (ref 70–99)
Potassium: 3.4 mmol/L — ABNORMAL LOW (ref 3.5–5.1)
Sodium: 140 mmol/L (ref 135–145)

## 2021-12-09 LAB — MAGNESIUM: Magnesium: 2.3 mg/dL (ref 1.7–2.4)

## 2021-12-09 MED ORDER — HYDRALAZINE HCL 20 MG/ML IJ SOLN
5.0000 mg | INTRAMUSCULAR | Status: DC | PRN
Start: 2021-12-09 — End: 2021-12-13
  Administered 2021-12-09 (×2): 5 mg via INTRAVENOUS
  Filled 2021-12-09 (×2): qty 1

## 2021-12-09 MED ORDER — METHYLPREDNISOLONE SODIUM SUCC 40 MG IJ SOLR
40.0000 mg | Freq: Every day | INTRAMUSCULAR | Status: DC
  Administered 2021-12-10 – 2021-12-11 (×2): 40 mg via INTRAVENOUS
  Filled 2021-12-09 (×2): qty 1

## 2021-12-09 MED ORDER — POTASSIUM CHLORIDE CRYS ER 20 MEQ PO TBCR
30.0000 meq | EXTENDED_RELEASE_TABLET | Freq: Once | ORAL | Status: AC
  Administered 2021-12-09: 30 meq via ORAL
  Filled 2021-12-09: qty 1

## 2021-12-09 NOTE — Progress Notes (Signed)
PROGRESS NOTE    [name]  ZWC:[id number] DOB: 09-16-1930 DOA: 12/06/2021 PCP: Tonia Ghent, MD   Brief Narrative:  88 year old female with history of COPD, chronic respiratory failure with hypoxia on 2 L oxygen by nasal cannula, TIAs, chronic systolic heart failure, allergic rhinitis, chronic back pain, chronic anemia presented with worsening shortness of breath, fall and some confusion.  On presentation, patient required BiPAP.  High-sensitivity troponin was 14 and 12.  CT of the head without contrast showed no acute intracranial process.  CT cervical spine showed no acute cervical fracture or evidence of subluxation or injury.  X-ray of the pelvis and right femur x-ray showed no acute fracture.  Chest x-ray showed no evidence of acute cardiopulmonary process.  CTA chest, abdomen and pelvis showed unchanged plaque within the aortic arch, also showing 5 cm infrarenal abdominal aortic aneurysm without evidence of dissection or rupture, as well as 4.3 cm ascending thoracic aortic aneurysm and radiology recommending follow-up imaging in 6 months; enlarging right axillary lymphadenopathy was also seen with radiology recommending appropriate outpatient mammographic and sonographic follow-up.  ED provider discussed case with Dr. Lissa Morales surgery who recommended outpatient referral to vascular surgery after discharge.  Patient was started on nebs and IV Solu-Medrol.  Assessment & Plan:   Acute on chronic hypoxic and hypercapnic respiratory failure COPD exacerbation -Normally uses 3 L oxygen via nasal cannula at home.  Required BiPAP on presentation and was hypercapnic. -Imaging findings as above.  COVID-19 testing negative on presentation.  We will check respiratory virus PCR -Respiratory status improving.  Currently on 3 L oxygen by nasal cannula.  Continue nebs, Zithromax and Dulera.  Decrease Solu-Medrol to 40 mg IV daily.  Acute metabolic encephalopathy -Possibly from above.   Mental status improving.  Monitor mental status.  Fall precautions.  Resumed scheduled BuSpar and Cymbalta.  Xanax resumed at a lower dose   Generalized weakness Possible mechanical fall Physical deconditioning -PT recommends SNF placement.  TOC consult.  CT of the head and cervical spine did not show any acute intracranial or cervical abnormality.  Ascending thoracic aortic aneurysm/abdominal aortic aneurysm -Imaging findings as above.  Outpatient follow-up with vascular surgery as above.  Chronic systolic heart failure -Currently compensated.  Last echo in January 2020 had shown EF 40 to 45%.  Strict input and output.  Daily weights.  Fluid restriction. outpatient cardiology follow-up.  History of CAD -continue Plavix  Cognitive impairment with possible dementia -Continue donepezil  Hypothyroidism -Continue levothyroxine  Breast cancer -Continue anastrozole.  Outpatient follow-up with oncology  Generalized anxiety disorder -BuSpar and Cymbalta plan as above.  Thrombocytopenia -questionable cause.  No signs of bleeding.  Monitor intermittently  Anemia of chronic disease -Hemoglobin currently stable.  Continue oral iron supplementation.   DVT prophylaxis: Lovenox Code Status: DNR Family Communication: Daughter at bedside on 12/08/2021 Disposition Plan: Status is: Inpatient Remains inpatient appropriate because: Of severity of illness.  Need for IV steroids possible discharge in 1 to 2 days if clinically improves.  Need for SNF placement.   Consultants: None  Procedures: None  Antimicrobials: Zithromax from 12/06/2021 onwards   Subjective: Patient seen and examined at bedside.  Hard of hearing.  Poor historian.  Slightly confused.  No overnight fever, vomiting, seizures or agitation reported.  Objective: Vitals:   12/09/21 0700 12/09/21 0745 12/09/21 0747 12/09/21 0748  BP: (!) 168/82     Pulse: 78     Resp: (!) 24     Temp:  98 F (36.7 C)  TempSrc:  Oral     SpO2: 100%  98% 98%  Weight:      Height:        Intake/Output Summary (Last 24 hours) at 12/09/2021 0803 Last data filed at 12/09/2021 0600 Gross per 24 hour  Intake 660 ml  Output 1450 ml  Net -790 ml    Filed Weights   12/06/21 2100 12/08/21 0500 12/09/21 0500  Weight: 61.7 kg 62.9 kg 59.9 kg    Examination:  General: No acute distress.  Currently still on 3 L oxygen via nasal cannula.  Elderly female lying in bed.  Hard of hearing ENT/neck: No palpable neck masses noted.  JVD is currently not elevated  respiratory: Bilateral decreased breath sounds at bases with basilar crackles.  Intermittent tachypnea. CVS: Rate is controlled currently; S1-S2 heard  abdominal: Soft, nontender, still distended; no organomegaly, bowel sounds heard Extremities: No clubbing; mild lower extremity edema present. CNS: Alert.  Poor historian.  Slightly confused.  Still slow to respond.  No focal neurologic deficit.  Moving extremities  lymph: No obvious palpable cervical lymphadenopathy Skin: No obvious petechiae/rashes  psych: No signs of agitation currently.  Smiling intermittently musculoskeletal: No obvious joint swelling/deformity     Data Reviewed: I have personally reviewed following labs and imaging studies  CBC: Recent Labs  Lab 12/06/21 1651 12/07/21 0249  WBC 4.7 4.0  NEUTROABS 3.3 3.7  HGB 10.0* 10.5*  HCT 32.8* 34.4*  MCV 105.5* 103.9*  PLT 131* 126*    Basic Metabolic Panel: Recent Labs  Lab 12/06/21 1651 12/06/21 1854 12/07/21 0249 12/08/21 2339  NA 144  --  140 140  K 3.7  --  3.8 3.4*  CL 101  --  102 99  CO2 37*  --  34* 34*  GLUCOSE 98  --  185* 126*  BUN 19  --  17 20  CREATININE 0.53  --  0.51 0.65  CALCIUM 9.2  --  8.6* 8.7*  MG  --  2.4 2.0 2.3  PHOS  --   --  3.0  --     GFR: Estimated Creatinine Clearance: 39.4 mL/min (by C-G formula based on SCr of 0.65 mg/dL). Liver Function Tests: Recent Labs  Lab 12/06/21 1651 12/07/21 0249  AST 22  22  ALT 11 12  ALKPHOS 36* 36*  BILITOT 1.0 0.6  PROT 6.7 6.6  ALBUMIN 3.5 3.3*    No results for input(s): LIPASE, AMYLASE in the last 168 hours. No results for input(s): AMMONIA in the last 168 hours. Coagulation Profile: No results for input(s): INR, PROTIME in the last 168 hours. Cardiac Enzymes: Recent Labs  Lab 12/06/21 1651  CKTOTAL 17*    BNP (last 3 results) No results for input(s): PROBNP in the last 8760 hours. HbA1C: No results for input(s): HGBA1C in the last 72 hours. CBG: No results for input(s): GLUCAP in the last 168 hours. Lipid Profile: No results for input(s): CHOL, HDL, LDLCALC, TRIG, CHOLHDL, LDLDIRECT in the last 72 hours. Thyroid Function Tests: Recent Labs    12/06/21 2045  TSH 0.067*    Anemia Panel: Recent Labs    12/06/21 2030  FOLATE 22.3    Sepsis Labs: No results for input(s): PROCALCITON, LATICACIDVEN in the last 168 hours.  Recent Results (from the past 240 hour(s))  MRSA Next Gen by PCR, Nasal     Status: None   Collection Time: 12/06/21  9:03 PM   Specimen: Nasal Mucosa; Nasal Swab  Result Value Ref Range  Status   MRSA by PCR Next Gen NOT DETECTED NOT DETECTED Final    Comment: (NOTE) The GeneXpert MRSA Assay (FDA approved for NASAL specimens only), is one component of a comprehensive MRSA colonization surveillance program. It is not intended to diagnose MRSA infection nor to guide or monitor treatment for MRSA infections. Test performance is not FDA approved in patients less than 50 years old. Performed at [facility], [place] [address] [place], [place] [address]   SARS Coronavirus 2 by RT PCR (hospital order, performed in [facility] hospital lab) *cepheid single result test* Anterior Nasal Swab     Status: None   Collection Time: 12/06/21 10:00 PM   Specimen: Anterior Nasal Swab  Result Value Ref Range Status   SARS Coronavirus 2 by RT PCR NEGATIVE NEGATIVE Final    Comment: (NOTE) SARS-CoV-2  target nucleic acids are NOT DETECTED.  The SARS-CoV-2 RNA is generally detectable in upper and lower respiratory specimens during the acute phase of infection. The lowest concentration of SARS-CoV-2 viral copies this assay can detect is 250 copies / mL. A negative result does not preclude SARS-CoV-2 infection and should not be used as the sole basis for treatment or other patient management decisions.  A negative result may occur with improper specimen collection / handling, submission of specimen other than nasopharyngeal swab, presence of viral mutation(s) within the areas targeted by this assay, and inadequate number of viral copies (<250 copies / mL). A negative result must be combined with clinical observations, patient history, and epidemiological information.  Fact Sheet for Patients:   https://www.patel.info/  Fact Sheet for Healthcare Providers: https://hall.com/  This test is not yet approved or  cleared by the [place] FDA and has been authorized for detection and/or diagnosis of SARS-CoV-2 by FDA under an Emergency Use Authorization (EUA).  This EUA will remain in effect (meaning this test can be used) for the duration of the COVID-19 declaration under Section 564(b)(1) of the Act, 21 U.S.C. section 360bbb-3(b)(1), unless the authorization is terminated or revoked sooner.  Performed at [facility], [place] [address], [place], [place] [address]   Respiratory (~20 pathogens) panel by PCR     Status: None   Collection Time: 12/07/21 10:29 AM   Specimen: Nasopharyngeal Swab; Respiratory  Result Value Ref Range Status   Adenovirus NOT DETECTED NOT DETECTED Final   Coronavirus 229E NOT DETECTED NOT DETECTED Final    Comment: (NOTE) The Coronavirus on the Respiratory Panel, DOES NOT test for the novel  Coronavirus (2019 nCoV)    Coronavirus HKU1 NOT DETECTED NOT DETECTED Final   Coronavirus NL63 NOT  DETECTED NOT DETECTED Final   Coronavirus OC43 NOT DETECTED NOT DETECTED Final   Metapneumovirus NOT DETECTED NOT DETECTED Final   Rhinovirus / Enterovirus NOT DETECTED NOT DETECTED Final   Influenza A NOT DETECTED NOT DETECTED Final   Influenza B NOT DETECTED NOT DETECTED Final   Parainfluenza Virus 1 NOT DETECTED NOT DETECTED Final   Parainfluenza Virus 2 NOT DETECTED NOT DETECTED Final   Parainfluenza Virus 3 NOT DETECTED NOT DETECTED Final   Parainfluenza Virus 4 NOT DETECTED NOT DETECTED Final   Respiratory Syncytial Virus NOT DETECTED NOT DETECTED Final   Bordetella pertussis NOT DETECTED NOT DETECTED Final   Bordetella Parapertussis NOT DETECTED NOT DETECTED Final   Chlamydophila pneumoniae NOT DETECTED NOT DETECTED Final   Mycoplasma pneumoniae NOT DETECTED NOT DETECTED Final    Comment: Performed at [facility], [place].  [address] [place], [place] [address]          Radiology Studies: No results found.      Scheduled Meds:  anastrozole  1 mg Oral Daily   azithromycin  500 mg Oral Daily   busPIRone  5 mg Oral BID   Chlorhexidine Gluconate Cloth  6 each Topical Daily   clopidogrel  75 mg Oral Daily   donepezil  5 mg Oral QHS   DULoxetine  60 mg Oral Daily   enoxaparin (LOVENOX) injection  40 mg Subcutaneous Daily   ferrous sulfate  325 mg Oral Q breakfast   ipratropium-albuterol  3 mL Nebulization TID   levothyroxine  112 mcg Oral Q0600   mouth rinse  15 mL Mouth Rinse q12n4p   methylPREDNISolone (SOLU-MEDROL) injection  80 mg Intravenous Daily   mometasone-formoterol  2 puff Inhalation BID   prochlorperazine  5 mg Intravenous Once   senna-docusate  1 tablet Oral BID   Continuous Infusions:          Aline August, MD [facility] 12/09/2021, 8:03 AM

## 2021-12-09 NOTE — Progress Notes (Signed)
PT Cancellation Note  Patient Details Name: Willy Vorce MRN: [id number] DOB: Dec 05, 1930   Cancelled Treatment:    Reason Eval/Treat Not Completed: Patient declined, no reason specified  Pt politely declined as she had just received meal tray and preferred to eat.  Will check back as schedule permits.   Myrtis Hopping [place] 12/09/2021, 4:03 PM Arlyce Dice, DPT Acute Rehabilitation Services Pager: [phone] Office: [phone]

## 2021-12-09 NOTE — TOC Progression Note (Addendum)
Transition of Care [facility]) - Progression Note    Patient Details  Name: [name] MRN: [id number] Date of Birth: [birth year]  Transition of Care [facility]) CM/SW Contact  Ross Ludwig, [place] Phone Number: 12/09/2021, 4:38 PM  Clinical Narrative:     Patient has been worked up for SNF placement. Patient will need insurance authorization and a SNF needs to agree to accept her.  CSW awaiting for bed offers, waiting for bed offers.    Barriers to Discharge: Continued Medical Work up  Expected Discharge Plan and Services    SNF placement                                             Social Determinants of Health (SDOH) Interventions    Readmission Risk Interventions     View : No data to display.

## 2021-12-09 NOTE — NC FL2 (Signed)
[place] LEVEL OF CARE SCREENING TOOL     IDENTIFICATION  Patient Name: [name] Birthdate: 17-Oct-1930 Sex: female Admission Date (Current Location): 12/06/2021  [facility] and [place] Number:  Herbalist and Address:  [facility],  [place], [place]      Provider Number: [id number]  Attending Physician Name and Address:  Aline August, MD  Relative Name and Phone Number:  Hassell Done [facility] [place] Daughter [phone]  [phone]  Tresa Res Daughter [phone]  [phone]    Current Level of Care: Hospital Recommended Level of Care: [place] Prior Approval Number:    Date Approved/Denied:   PASRR Number: [id number]  Discharge Plan: SNF    Current Diagnoses: Patient Active Problem List   Diagnosis Date Noted   Acute exacerbation of chronic obstructive pulmonary disease (COPD) ([place]) 28/78/6767   Acute metabolic encephalopathy 20/94/7096   Ground-level fall 12/06/2021   Breast cancer ([place])    Chronic systolic CHF (congestive heart failure) ([place])    Syncope 09/25/2021   Generalized weakness 07/07/2020   Decreased appetite 09/20/2019   Lower GI bleeding 09/09/2019   DNR (do not resuscitate) 09/08/2019   Chronic anemia 09/07/2019   Macrocytic anemia 09/07/2019   Jerking 09/07/2019   Fall at home, initial encounter 09/07/2019   History of CVA (cerebrovascular accident) 09/07/2019   History of breast cancer 09/07/2019   Pelvic pain 03/25/2019   Dysphagia 02/18/2019   Coronary artery disease involving native coronary artery of native heart with unstable angina pectoris ([place]) 01/30/2019   Presence of drug coated stent in right coronary artery 01/30/2019   Left main coronary artery disease - ~50-60% dLM (&70% dLAD) -- Plan Med Rx 01/30/2019   Ischemic cardiomyopathy 01/30/2019   Acute ST elevation myocardial infarction (STEMI) of inferior wall ([place]) 01/29/2019   AAA (abdominal aortic  aneurysm) ([place]) 12/31/2018   Malignant neoplasm of upper-outer quadrant of right breast in female, estrogen receptor positive ([place]) 09/26/2018   Breast mass 08/28/2018   Bilateral impacted cerumen 08/14/2017   Laryngopharyngeal reflux (LPR) 08/14/2017   Presbycusis of both ears 08/14/2017   Thoracic aortic aneurysm 04/10/2017   Abdominal pain 04/05/2017   Low back pain 01/01/2017   Medicare annual wellness visit, subsequent 09/26/2016   Hard of hearing 09/26/2016   Vitamin D deficiency 09/26/2016   Advance care planning 09/26/2016   Encounter for chronic pain management 07/26/2016   Edema 10/21/2014   Multinodular thyroid 07/07/2013   COPD (chronic obstructive pulmonary disease) ([place]) 06/09/2013   HTN (hypertension) 06/09/2013   Chronic respiratory failure with hypoxia ([place]) 05/13/2013   Cough 05/02/2013   Vertigo 12/31/2012   Acute blood loss anemia 12/13/2011   Osteoarthritis of right knee 08/27/2011   Gold C Copd with asthmatic bronchitis component and chronic resp failure 06/26/2011   Neck pain 04/02/2011   RISK OF FALLING 08/17/2010   Hypokalemia 05/24/2009   Anemia, unspecified 04/10/2008   Anxiety 03/01/2008   Osteoporosis 06/23/2007   Chronic pain syndrome 12/26/2006   Acquired hypothyroidism 12/24/2006   Hyperlipidemia with target LDL less than 70 12/24/2006   COMMON MIGRAINE 12/24/2006   GERD 12/24/2006   DEGENERATIVE DISC DISEASE 12/24/2006   Fibromyalgia 12/24/2006   Insomnia 12/24/2006   HYPERGLYCEMIA 12/24/2006    Orientation RESPIRATION BLADDER Height & Weight     Self, Time, Place  O2 (3L) Incontinent Weight: 132 lb 0.9 oz (59.9 kg) Height:  5' (152.4 cm)  BEHAVIORAL SYMPTOMS/MOOD NEUROLOGICAL BOWEL NUTRITION STATUS  Continent Diet (Regular diet, limited fluid intake)  AMBULATORY STATUS COMMUNICATION OF NEEDS Skin   Limited Assist Verbally Normal                       Personal Care Assistance Level of Assistance  Bathing, Feeding,  Dressing Bathing Assistance: Limited assistance Feeding assistance: Independent Dressing Assistance: Limited assistance     Functional Limitations Info  Sight, Speech, Hearing Sight Info: Adequate Hearing Info: Adequate Speech Info: Adequate    SPECIAL CARE FACTORS FREQUENCY  OT (By licensed OT), PT (By licensed PT)     PT Frequency: Minimum 5x a week OT Frequency: Minimum 5x a week            Contractures Contractures Info: Not present    Additional Factors Info  Code Status, Allergies, Psychotropic Code Status Info: DNR Allergies Info: Doxycycline   Sulfadiazine   Sulfa Antibiotics Psychotropic Info: busPIRone (BUSPAR) tablet 5 mg         Current Medications (12/09/2021):  This is the current hospital active medication list Current Facility-Administered Medications  Medication Dose Route Frequency Provider Last Rate Last Admin   acetaminophen (TYLENOL) tablet 650 mg  650 mg Oral Q6H PRN Howerter, Justin B, DO   650 mg at 12/09/21 1222   Or   acetaminophen (TYLENOL) suppository 650 mg  650 mg Rectal Q6H PRN Howerter, Justin B, DO       albuterol (PROVENTIL) (2.5 MG/3ML) 0.083% nebulizer solution 2.5 mg  2.5 mg Nebulization Q2H PRN Starla Link, Kshitiz, MD       ALPRAZolam Duanne Moron) tablet 0.25 mg  0.25 mg Oral TID PRN Aline August, MD   0.25 mg at 12/09/21 0909   anastrozole (ARIMIDEX) tablet 1 mg  1 mg Oral Daily Howerter, Justin B, DO   1 mg at 12/09/21 1040   azithromycin (ZITHROMAX) tablet 500 mg  500 mg Oral Daily Suzzanne Cloud, RPH   500 mg at 12/09/21 1040   bisacodyl (DULCOLAX) suppository 10 mg  10 mg Rectal Daily PRN Aline August, MD       busPIRone (BUSPAR) tablet 5 mg  5 mg Oral BID Starla Link, Kshitiz, MD   5 mg at 12/09/21 1040   Chlorhexidine Gluconate Cloth 2 % PADS 6 each  6 each Topical Daily Howerter, Justin B, DO   6 each at 12/09/21 0935   clopidogrel (PLAVIX) tablet 75 mg  75 mg Oral Daily Howerter, Justin B, DO   75 mg at 12/09/21 1040   donepezil  (ARICEPT) tablet 5 mg  5 mg Oral QHS Howerter, Justin B, DO   5 mg at 12/08/21 2344   DULoxetine (CYMBALTA) DR capsule 60 mg  60 mg Oral Daily Alekh, Kshitiz, MD   60 mg at 12/09/21 1039   enoxaparin (LOVENOX) injection 40 mg  40 mg Subcutaneous Daily Starla Link, Kshitiz, MD   40 mg at 12/09/21 1039   fentaNYL (SUBLIMAZE) injection 50 mcg  50 mcg Intravenous Q2H PRN Howerter, Justin B, DO   50 mcg at 12/09/21 0539   ferrous sulfate tablet 325 mg  325 mg Oral Q breakfast Howerter, Justin B, DO   325 mg at 12/09/21 0843   fluticasone (FLONASE) 50 MCG/ACT nasal spray 2 spray  2 spray Each Nare Daily PRN Howerter, Justin B, DO       guaiFENesin-dextromethorphan (ROBITUSSIN DM) 100-10 MG/5ML syrup 10 mL  10 mL Oral Q4H PRN Aline August, MD   10 mL at 12/07/21 2042   hydrALAZINE (APRESOLINE)  injection 5 mg  5 mg Intravenous Q4H PRN Rise Patience, MD   5 mg at 12/09/21 1053   iohexol (OMNIPAQUE) 300 MG/ML solution 100 mL  100 mL Intravenous Once PRN Drenda Freeze, MD       ipratropium-albuterol (DUONEB) 0.5-2.5 (3) MG/3ML nebulizer solution 3 mL  3 mL Nebulization TID Aline August, MD   3 mL at 12/09/21 1410   levothyroxine (SYNTHROID) tablet 112 mcg  112 mcg Oral Q0600 Howerter, Justin B, DO   112 mcg at 12/09/21 0512   MEDLINE mouth rinse  15 mL Mouth Rinse q12n4p Howerter, Justin B, DO   15 mL at 12/09/21 1200   [START ON 12/10/2021] methylPREDNISolone sodium succinate (SOLU-MEDROL) 40 mg/mL injection 40 mg  40 mg Intravenous Daily Alekh, Kshitiz, MD       mometasone-formoterol (DULERA) 200-5 MCG/ACT inhaler 2 puff  2 puff Inhalation BID Aline August, MD   2 puff at 12/09/21 0747   naloxone (NARCAN) injection 0.4 mg  0.4 mg Intravenous PRN Howerter, Justin B, DO       polyethylene glycol (MIRALAX / GLYCOLAX) packet 17 g  17 g Oral Daily PRN Alekh, Kshitiz, MD       prochlorperazine (COMPAZINE) injection 5 mg  5 mg Intravenous Once Rise Patience, MD       senna-docusate (Senokot-S)  tablet 1 tablet  1 tablet Oral BID Aline August, MD   1 tablet at 12/09/21 1040   traZODone (DESYREL) tablet 150 mg  150 mg Oral QHS PRN Howerter, Justin B, DO   150 mg at 12/08/21 2107     Discharge Medications: Please see discharge summary for a list of discharge medications.  Relevant Imaging Results:  Relevant Lab Results:   Additional Information SSN [id number]  Ross Ludwig, LCSW

## 2021-12-10 DIAGNOSIS — R531 Weakness: Secondary | ICD-10-CM | POA: Diagnosis not present

## 2021-12-10 DIAGNOSIS — J441 Chronic obstructive pulmonary disease with (acute) exacerbation: Secondary | ICD-10-CM | POA: Diagnosis not present

## 2021-12-10 DIAGNOSIS — G9341 Metabolic encephalopathy: Secondary | ICD-10-CM | POA: Diagnosis not present

## 2021-12-10 DIAGNOSIS — I5022 Chronic systolic (congestive) heart failure: Secondary | ICD-10-CM | POA: Diagnosis not present

## 2021-12-10 LAB — BASIC METABOLIC PANEL
Anion gap: 5 (ref 5–15)
BUN: 19 mg/dL (ref 8–23)
CO2: 34 mmol/L — ABNORMAL HIGH (ref 22–32)
Calcium: 8.9 mg/dL (ref 8.9–10.3)
Chloride: 102 mmol/L (ref 98–111)
Creatinine, Ser: 0.57 mg/dL (ref 0.44–1.00)
GFR, Estimated: >60 mL/min (ref >60)
Glucose, Bld: 90 mg/dL (ref 70–99)
Potassium: 3.6 mmol/L (ref 3.5–5.1)
Sodium: 141 mmol/L (ref 135–145)

## 2021-12-10 LAB — CBC WITH DIFFERENTIAL/PLATELET
Abs Immature Granulocytes: 0 10*3/uL (ref 0.00–0.07)
Basophils Absolute: 0 10*3/uL (ref 0.0–0.1)
Basophils Relative: 0 %
Eosinophils Absolute: 0 10*3/uL (ref 0.0–0.5)
Eosinophils Relative: 0 %
HCT: 31.7 % — ABNORMAL LOW (ref 36.0–46.0)
Hemoglobin: 9.9 g/dL — ABNORMAL LOW (ref 12.0–15.0)
Immature Granulocytes: 0 %
Lymphocytes Relative: 30 %
Lymphs Abs: 1.3 10*3/uL (ref 0.7–4.0)
MCH: 31.9 pg (ref 26.0–34.0)
MCHC: 31.2 g/dL (ref 30.0–36.0)
MCV: 102.3 fL — ABNORMAL HIGH (ref 80.0–100.0)
Monocytes Absolute: 0.4 10*3/uL (ref 0.1–1.0)
Monocytes Relative: 9 %
Neutro Abs: 2.8 10*3/uL (ref 1.7–7.7)
Neutrophils Relative %: 61 %
Platelets: 147 10*3/uL — ABNORMAL LOW (ref 150–400)
RBC: 3.1 MIL/uL — ABNORMAL LOW (ref 3.87–5.11)
RDW: 13.8 % (ref 11.5–15.5)
WBC: 4.5 10*3/uL (ref 4.0–10.5)
nRBC: 0 % (ref 0.0–0.2)

## 2021-12-10 LAB — METHYLMALONIC ACID, SERUM: Methylmalonic Acid, Quantitative: 341 nmol/L (ref 0–378)

## 2021-12-10 LAB — MAGNESIUM: Magnesium: 2.2 mg/dL (ref 1.7–2.4)

## 2021-12-10 NOTE — Progress Notes (Signed)
Physical Therapy Treatment Patient Details Name: [name] MRN: [id number] DOB: 29-Jan-1931 Today's Date: 12/10/2021   History of Present Illness 88 year old female with history of COPD, chronic respiratory failure with hypoxia on 2 L oxygen by nasal cannula, TIAs, chronic systolic heart failure, allergic rhinitis, chronic back pain, chronic anemia presented with worsening shortness of breath, fall and some confusion. Patient admitted with exacerbation of COPD.    PT Comments    Pt continues very motivated and with noted improvement in activity tolerance and stability but continues unsteady on feet and at risk for falling.  This date, pt up to bathroom for toileting and hygiene at sink and then ambulated increased distance in hall but with multiple standing rest breaks required to complete task.   Recommendations for follow up therapy are one component of a multi-disciplinary discharge planning process, led by the attending physician.  Recommendations may be updated based on patient status, additional functional criteria and insurance authorization.  Follow Up Recommendations  Skilled nursing-short term rehab (<3 hours/day)     Assistance Recommended at Discharge Frequent or constant Supervision/Assistance  Patient can return home with the following A little help with walking and/or transfers;A little help with bathing/dressing/bathroom;Assistance with cooking/housework;Assist for transportation;Help with stairs or ramp for entrance   Equipment Recommendations  None recommended by PT    Recommendations for Other Services       Precautions / Restrictions Precautions Precautions: Other (comment) Precaution Comments: monitor BP, dizziness with standing Restrictions Weight Bearing Restrictions: No     Mobility  Bed Mobility Overal bed mobility: Needs Assistance Bed Mobility: Supine to Sit, Sit to Supine     Supine to sit: Supervision Sit to supine: Supervision    General bed mobility comments: sup for safety    Transfers Overall transfer level: Needs assistance Equipment used: Rolling walker (2 wheels) Transfers: Sit to/from Stand Sit to Stand: Min guard           General transfer comment: steady assist for balance    Ambulation/Gait Ambulation/Gait assistance: Min assist, Min guard Gait Distance (Feet): 400 Feet Assistive device: Rolling walker (2 wheels) Gait Pattern/deviations: Step-to pattern, Step-through pattern, Decreased step length - right, Decreased step length - left, Shuffle, Trunk flexed, Narrow base of support Gait velocity: decr     General Gait Details: cues for posture, position from RW and to widen BOS for balance   Stairs             Wheelchair Mobility    Modified Rankin (Stroke Patients Only)       Balance Overall balance assessment: Needs assistance Sitting-balance support: No upper extremity supported Sitting balance-Leahy Scale: Good     Standing balance support: Single extremity supported Standing balance-Leahy Scale: Poor                              Cognition Arousal/Alertness: Awake/alert Behavior During Therapy: WFL for tasks assessed/performed Overall Cognitive Status: Within Functional Limits for tasks assessed                                 General Comments: Pt admits to memory deficits        Exercises      General Comments        Pertinent Vitals/Pain Pain Assessment Pain Assessment: No/denies pain    Home Living  Prior Function            PT Goals (current goals can now be found in the care plan section) Acute Rehab PT Goals Patient Stated Goal: REgain IND PT Goal Formulation: With patient Time For Goal Achievement: 12/22/21 Potential to Achieve Goals: Fair Progress towards PT goals: Progressing toward goals    Frequency    Min 3X/week      PT Plan Current plan remains appropriate     Co-evaluation              AM-PAC PT "6 Clicks" Mobility   Outcome Measure  Help needed turning from your back to your side while in a flat bed without using bedrails?: None Help needed moving from lying on your back to sitting on the side of a flat bed without using bedrails?: None Help needed moving to and from a bed to a chair (including a wheelchair)?: A Little Help needed standing up from a chair using your arms (e.g., wheelchair or bedside chair)?: A Little Help needed to walk in hospital room?: A Little Help needed climbing 3-5 steps with a railing? : A Lot 6 Click Score: 19    End of Session Equipment Utilized During Treatment: Gait belt;Oxygen Activity Tolerance: Patient tolerated treatment well Patient left: in bed;with call bell/phone within reach;with bed alarm set   PT Visit Diagnosis: Unsteadiness on feet (R26.81);History of falling (Z91.81);Difficulty in walking, not elsewhere classified (R26.2);Dizziness and giddiness (R42)     Time: 3559-7416 PT Time Calculation (min) (ACUTE ONLY): 24 min  Charges:  $Gait Training: 8-22 mins $Therapeutic Activity: 8-22 mins                     Debe Coder PT Acute Rehabilitation Services Pager [phone] Office [phone]    Ashyla Luth 12/10/2021, 12:34 PM

## 2021-12-10 NOTE — Evaluation (Signed)
Clinical/Bedside Swallow Evaluation Patient Details  Name: [name] MRN: [id number] Date of Birth: 06/16/1931  Today's Date: 12/10/2021 Time: SLP Start Time (ACUTE ONLY): 64 SLP Stop Time (ACUTE ONLY): 6063 SLP Time Calculation (min) (ACUTE ONLY): 15 min  Past Medical History:  Past Medical History:  Diagnosis Date   Abdominal pain 04/05/2017   Allergic rhinitis, cause unspecified    Allergy, unspecified not elsewhere classified    Anemia, unspecified 04/10/2008   Qualifier: Diagnosis of  By: Linna Darner MD, [place] state 03/01/2008   Qualifier: Diagnosis of  By: Council Mechanic MD, Hilaria Ota    Asthma    Breast cancer ([place]) 08/2018   right breast   Breast mass 08/28/2018   Chronic kidney disease    frequency   COMMON MIGRAINE 12/24/2006   Qualifier: Diagnosis of  By: Fuller Plan CMA (AAMA), [place]     COPD (chronic obstructive pulmonary disease) ([place])    Degeneration of intervertebral disc, site unspecified    Diverticulosis of colon (without mention of hemorrhage)    DNR (do not resuscitate) 09/08/2019   Dysphagia 02/18/2019   Encounter for chronic pain management 07/26/2016   Indication for chronic opioid: chronic back pain Medication and dose:  Oxycodone '10mg'$  TID # pills per month: 90 Last UDS date: 04/15/15 Pain contract signed (Y/N):  yes Date narcotic database last reviewed (include red flags):  05/06/17   Esophageal reflux    Fibromyalgia 12/24/2006   Qualifier: Diagnosis of  By: Fuller Plan CMA (AAMA), Lug[place]     Headache    HTN (hypertension) 06/09/2013   Hyperlipidemia with target LDL less than 70 12/24/2006   Qualifier: Diagnosis of  By: Fuller Plan CMA (AAMA), [place]     Hypokalemia 05/24/2009   Qualifier: Diagnosis of  By: Lacretia Nicks     Hypothyroidism 12/24/2006   Qualifier: Diagnosis of  By: Fuller Plan CMA (AAMA), [place]     Insomnia 12/24/2006   she had trials of antihistamines and TCAs prev w/o effect.      Insomnia, unspecified    Lower GI bleeding  09/09/2019   Myalgia and myositis, unspecified    Need for prophylactic hormone replacement therapy (postmenopausal)    Osteoarthritis of right knee 08/27/2011   R knee pain, s/p arthroscopy 2012 per [name]    Osteoporosis 06/23/2007   Qualifier: Diagnosis of  By: Council Mechanic MD, Hilaria Ota    Osteoporosis, unspecified    Other abnormal blood chemistry    Other and unspecified hyperlipidemia    Other chronic pain    Presence of drug coated stent in right coronary artery 01/30/2019   Stroke [facility])    mini strokes   Syncope 01/24/2017   Thoracic aortic aneurysm ([place]) 04/10/2017   Unspecified essential hypertension    Unspecified hypothyroidism    Vitamin D deficiency 09/26/2016   Past Surgical History:  Past Surgical History:  Procedure Laterality Date   BACK SURGERY     breast cystectomy     CORONARY/GRAFT ACUTE MI REVASCULARIZATION N/A 01/29/2019   Procedure: CORONARY/GRAFT ACUTE MI REVASCULARIZATION;  Surgeon: Belva Crome, MD;  Location: [place] [facility];  Service: Cardiovascular;  Laterality: N/A;   JOINT REPLACEMENT     shoulder right   KNEE ARTHROSCOPY     Right knee 2012   MASS EXCISION Left 04/07/2015   Procedure: MINOR EXCISION OF MASS LEFT SMALL FINGER;  Surgeon: Leanora Cover, MD;  Location: [place];  Service: Orthopedics;  Laterality: Left;  SHOULDER SURGERY     TOTAL KNEE ARTHROPLASTY  12/11/2011   Procedure: TOTAL KNEE ARTHROPLASTY;  Surgeon: Johnny Bridge, MD;  Location: [place];  Service: Orthopedics;  Laterality: Right;   UMBILICAL HERNIA REPAIR N/A 06/09/2013   Procedure: EXPLORATIORY LAPAROTOMY, HERNIA REPAIR UMBILICAL, INSERTION OF MESH;  Surgeon: Haywood Lasso, MD;  Location: [place];  Service: General;  Laterality: N/A;   HPI:  Patient is an 88 y.o. female with PMH: chronic hypoxic respiratory failure on 3L continuous nasal cannula, severe COPD, TIA's, chronic systolic heart failure, allergic rhinitis, chronic back pain, chronic anemia who  was admitted to [facility] ED complaining of SOB. In ED she was afebrile, CT head negative for acute intracranial process, CXR showed no evidence of acute cardiopulmonary process, CT chest/abdomen/pelvisdid not show any evidence of infiltrate, edema, effusion or pneumothorax.  Patient c/o feeling that food was getting stuck in throat, prompting order for speech swallow evaluation.    Assessment / Plan / Recommendation  Clinical Impression  Patient presenting with what appears to be a pharyngeal or pharyngoesophgeal dysphagia as per this bedside/clinical swallow evaluation and patient's report. She reported to SLP that she has had approximately a one-year h/o dysphagia which consists of solids "cling" here (points to throat and then sternum). She has been eating soft foods and avoiding most solids. When swallowing water during this evaluation, patient did have a mild delayed cough response and in addition, appeared to be squeezing hard during pharyngeal phase resulting in audible swallow. Based on patient's reports and observed s/s, SLP is recommending to proceed with objective swallow study (MBS) which can be completed next date. Patient in agreement with this plan. SLP Visit Diagnosis: Dysphagia, unspecified (R13.10)    Aspiration Risk  Mild aspiration risk    Diet Recommendation Regular;Thin liquid   Liquid Administration via: Cup;Straw Medication Administration: Whole meds with liquid Supervision: Patient able to self feed Compensations: Minimize environmental distractions;Slow rate;Small sips/bites Postural Changes: Seated upright at 90 degrees;Remain upright for at least 30 minutes after po intake    Other  Recommendations Oral Care Recommendations: Oral care BID    Recommendations for follow up therapy are one component of a multi-disciplinary discharge planning process, led by the attending physician.  Recommendations may be updated based on patient status, additional functional criteria and  insurance authorization.  Follow up Recommendations Other (comment) (TBD pending MBS results)      Assistance Recommended at Discharge Intermittent Supervision/Assistance  Functional Status Assessment Patient has had a recent decline in their functional status and demonstrates the ability to make significant improvements in function in a reasonable and predictable amount of time.  Frequency and Duration min 1 x/week  1 week       Prognosis Prognosis for Safe Diet Advancement: Good      Swallow Study   General Date of Onset: 12/08/21 HPI: Patient is an 88 y.o. female with PMH: chronic hypoxic respiratory failure on 3L continuous nasal cannula, severe COPD, TIA's, chronic systolic heart failure, allergic rhinitis, chronic back pain, chronic anemia who was admitted to [facility] ED complaining of SOB. In ED she was afebrile, CT head negative for acute intracranial process, CXR showed no evidence of acute cardiopulmonary process, CT chest/abdomen/pelvisdid not show any evidence of infiltrate, edema, effusion or pneumothorax.  Patient c/o feeling that food was getting stuck in throat, prompting order for speech swallow evaluation. Type of Study: Bedside Swallow Evaluation Previous Swallow Assessment: none found Diet Prior to this Study: Regular;Thin liquids Temperature Spikes Noted:  No Respiratory Status: Nasal cannula History of Recent Intubation: No Behavior/Cognition: Alert;Cooperative;Pleasant mood Oral Cavity Assessment: Within Functional Limits Oral Care Completed by SLP: No Oral Cavity - Dentition: Adequate natural dentition Vision: Functional for self-feeding Self-Feeding Abilities: Able to feed self Patient Positioning: Upright in bed Baseline Vocal Quality: Normal Volitional Cough: Strong Volitional Swallow: Able to elicit    Oral/Motor/Sensory Function Overall Oral Motor/Sensory Function: Within functional limits   Ice Chips     Thin Liquid Thin Liquid: Impaired Presentation:  Straw;Self Fed Pharyngeal  Phase Impairments: Cough - Delayed    Nectar Thick Nectar Thick Liquid: Not tested   Honey Thick Honey Thick Liquid: Not tested   Puree Puree: Not tested   Solid     Solid: Not tested      Sonia Baller, MA, CCC-SLP Speech Therapy

## 2021-12-10 NOTE — TOC Progression Note (Signed)
Transition of Care [facility]) - Progression Note    Patient Details  Name: [name] MRN: [id number] Date of Birth: [birth year]  Transition of Care [facility]) CM/SW Contact  Ross Ludwig, [place] Phone Number: 12/10/2021, 1:08 PM  Clinical Narrative:     CSW awaiting for bed offers for patient for short term rehab.     Barriers to Discharge: Continued Medical Work up  Expected Discharge Plan and Services                                                 Social Determinants of Health (SDOH) Interventions    Readmission Risk Interventions     View : No data to display.

## 2021-12-10 NOTE — Progress Notes (Signed)
Occupational Therapy Treatment Patient Details Name: [name] MRN: [id number] DOB: [birth year] Today's Date: 12/10/2021   History of present illness 88 year old female with history of COPD, chronic respiratory failure with hypoxia on 2 L oxygen by nasal cannula, TIAs, chronic systolic heart failure, allergic rhinitis, chronic back pain, chronic anemia presented with worsening shortness of breath, fall and some confusion. Patient admitted with exacerbation of COPD.   OT comments  Patient supine in bed and agreeable to therapy. Therapist monitored BP - patient dropped from 147/65 to 122/74 with sitting but improved to 374 systolically when standing. She still reports feeling mildly dizzy initially and is unsteady with standing. She requires at least one hand support at times - demonstrated with in room ambulation and toileting task. She is typically able to use both hands to manage clothing but today she is grasping for the grab bar. She is fearful of falling and feels she will fall. Patient is alone in the evening and due to continued impaired balance and orthostatic blood pressures she is a high fall risk. Will change recommendation to short term rehab at discharge prior to return home.    Recommendations for follow up therapy are one component of a multi-disciplinary discharge planning process, led by the attending physician.  Recommendations may be updated based on patient status, additional functional criteria and insurance authorization.    Follow Up Recommendations  Skilled nursing-short term rehab (<3 hours/day)    Assistance Recommended at Discharge Frequent or constant Supervision/Assistance  Patient can return home with the following  A little help with walking and/or transfers;A little help with bathing/dressing/bathroom;Assistance with cooking/housework;Help with stairs or ramp for entrance   Equipment Recommendations  None recommended by OT    Recommendations for Other  Services      Precautions / Restrictions Precautions Precautions: Other (comment) Precaution Comments: monitor BP, dizziness with standing Restrictions Weight Bearing Restrictions: No       Mobility Bed Mobility                    Transfers                         Balance Overall balance assessment: Needs assistance Sitting-balance support: No upper extremity supported, Feet supported Sitting balance-Leahy Scale: Fair     Standing balance support: Single extremity supported Standing balance-Leahy Scale: Poor                             ADL either performed or assessed with clinical judgement   ADL Overall ADL's : Needs assistance/impaired                     Lower Body Dressing: Min guard;Sit to/from stand Lower Body Dressing Details (indicate cue type and reason): donned underwear seated on toilet. Needed grab bar to steady herself. Unable to use bilateral hands due to impaired balance Toilet Transfer: Min guard;Rolling walker (2 wheels);Grab bars Toilet Transfer Details (indicate cue type and reason): ambulated to bathroom with RW with min guard. She is fearful and says she feels like she will fall over if she lets go Toileting- Water quality scientist and Hygiene: Min guard Toileting - Clothing Manipulation Details (indicate cue type and reason): Able to manage clothing and clean herself. Needs hand hold with standing during task (to reach needed areas)     Functional mobility during ADLs: Minimal assistance;Rolling walker (2 wheels)  Extremity/Trunk Assessment Upper Extremity Assessment Upper Extremity Assessment: Overall WFL for tasks assessed   Lower Extremity Assessment Lower Extremity Assessment: Defer to PT evaluation   Cervical / Trunk Assessment Cervical / Trunk Assessment: Kyphotic    Vision Ability to See in Adequate Light: 2 Moderately impaired Patient Visual Report: No change from baseline     Perception      Praxis      Cognition Arousal/Alertness: Awake/alert Behavior During Therapy: WFL for tasks assessed/performed Overall Cognitive Status: Within Functional Limits for tasks assessed                                 General Comments: Pt admits to memory deficits, [facility]        Exercises      Shoulder Instructions       General Comments      Pertinent Vitals/ Pain       Pain Assessment Pain Assessment: No/denies pain  Home Living                                          Prior Functioning/Environment              Frequency  Min 2X/week        Progress Toward Goals  OT Goals(current goals can now be found in the care plan section)  Progress towards OT goals: Progressing toward goals  Acute Rehab OT Goals Patient Stated Goal: get stronger  - better balance OT Goal Formulation: With patient Time For Goal Achievement: 12/21/21 Potential to Achieve Goals: Good  Plan Discharge plan needs to be updated    Co-evaluation                 AM-PAC OT "6 Clicks" Daily Activity     Outcome Measure   Help from another person eating meals?: None Help from another person taking care of personal grooming?: A Little Help from another person toileting, which includes using toliet, bedpan, or urinal?: A Little Help from another person bathing (including washing, rinsing, drying)?: A Little Help from another person to put on and taking off regular upper body clothing?: A Little Help from another person to put on and taking off regular lower body clothing?: A Little 6 Click Score: 19    End of Session Equipment Utilized During Treatment: Rolling walker (2 wheels);Oxygen  OT Visit Diagnosis: Muscle weakness (generalized) (M62.81)   Activity Tolerance Patient tolerated treatment well   Patient Left in chair;with family/visitor present   Nurse Communication Mobility status        Time: 0934-1000 OT Time Calculation (min): 26  min  Charges: OT General Charges $OT Visit: 1 Visit OT Treatments $Self Care/Home Management : 8-22 mins $Therapeutic Activity: 8-22 mins  Dublin Cantero, OTR/L Acute Care Rehab Services  Office [phone] Pager: [place] 12/10/2021, 12:40 PM

## 2021-12-10 NOTE — Progress Notes (Signed)
PROGRESS NOTE    [name]  XBL:[id number] DOB: 10-31-1930 DOA: 12/06/2021 PCP: Tonia Ghent, MD   Brief Narrative:  88 year old female with history of COPD, chronic respiratory failure with hypoxia on 2 L oxygen by nasal cannula, TIAs, chronic systolic heart failure, allergic rhinitis, chronic back pain, chronic anemia presented with worsening shortness of breath, fall and some confusion.  On presentation, patient required BiPAP.  High-sensitivity troponin was 14 and 12.  CT of the head without contrast showed no acute intracranial process.  CT cervical spine showed no acute cervical fracture or evidence of subluxation or injury.  X-ray of the pelvis and right femur x-ray showed no acute fracture.  Chest x-ray showed no evidence of acute cardiopulmonary process.  CTA chest, abdomen and pelvis showed unchanged plaque within the aortic arch, also showing 5 cm infrarenal abdominal aortic aneurysm without evidence of dissection or rupture, as well as 4.3 cm ascending thoracic aortic aneurysm and radiology recommending follow-up imaging in 6 months; enlarging right axillary lymphadenopathy was also seen with radiology recommending appropriate outpatient mammographic and sonographic follow-up.  ED provider discussed case with Dr. Lissa Morales surgery who recommended outpatient referral to vascular surgery after discharge.  Patient was started on nebs and IV Solu-Medrol.  PT subsequently recommended SNF placement.  Assessment & Plan:   Acute on chronic hypoxic and hypercapnic respiratory failure COPD exacerbation -Normally uses 3 L oxygen via nasal cannula at home.  Required BiPAP on presentation and was hypercapnic. -Imaging findings as above.  COVID-19 testing negative on presentation.  Respiratory virus PCR negative -Respiratory status improving.  Currently on 2-3 L oxygen by nasal cannula.  Continue nebs, Zithromax and Dulera.  Continue Solu-Medrol 40 mg IV daily.  Acute metabolic  encephalopathy -Possibly from above.  Mental status improving.  Monitor mental status.  Fall precautions.  Resumed scheduled BuSpar and Cymbalta.  Xanax resumed at a lower dose   Generalized weakness Possible mechanical fall Physical deconditioning -PT recommends SNF placement.  TOC consult.  CT of the head and cervical spine did not show any acute intracranial or cervical abnormality.  Ascending thoracic aortic aneurysm/abdominal aortic aneurysm -Imaging findings as above.  Outpatient follow-up with vascular surgery as above.  Chronic systolic heart failure -Currently compensated.  Last echo in January 2020 had shown EF 40 to 45%.  Strict input and output.  Daily weights.  Fluid restriction. outpatient cardiology follow-up.  History of CAD -continue Plavix  Cognitive impairment with possible dementia -Continue donepezil  Hypothyroidism -Continue levothyroxine  Breast cancer -Continue anastrozole.  Outpatient follow-up with oncology  Generalized anxiety disorder -BuSpar and Cymbalta plan as above.  Thrombocytopenia -questionable cause.  No signs of bleeding.  Monitor intermittently  Anemia of chronic disease -Hemoglobin currently stable.  Continue oral iron supplementation.  Hypokalemia -Improved   DVT prophylaxis: Lovenox Code Status: DNR Family Communication: Daughter at bedside  Disposition Plan: Status is: Inpatient Remains inpatient appropriate because: Of severity of illness.  Need for IV steroids.  Possible discharge in 1 to 2 days if clinically improves.  Need for SNF placement.   Consultants: None  Procedures: None  Antimicrobials: Zithromax from 12/06/2021 onwards   Subjective: Patient seen and examined at bedside.  Hard of hearing.  Poor historian.  No agitation, seizures, vomiting fever or chest pain reported. Objective: Vitals:   12/09/21 1926 12/09/21 2149 12/10/21 0500 12/10/21 0606  BP:  126/83  (!) 148/65  Pulse:  77  69  Resp:  18  18   Temp:  98.5  F (36.9 C)  98.8 F (37.1 C)  TempSrc:  Oral  Oral  SpO2: 98% 100%  97%  Weight:   60.8 kg   Height:        Intake/Output Summary (Last 24 hours) at 12/10/2021 0747 Last data filed at 12/10/2021 0600 Gross per 24 hour  Intake 580 ml  Output 900 ml  Net -320 ml    Filed Weights   12/08/21 0500 12/09/21 0500 12/10/21 0500  Weight: 62.9 kg 59.9 kg 60.8 kg    Examination:  General: On 2 to 3 L oxygen via nasal cannula.  No distress.  Elderly female lying in bed.  Chronically ill and deconditioned looking.  Hard of hearing ENT/neck: No palpable thyromegaly or JVD elevation respiratory: Decreased breath sounds at bases with scattered crackles  CVS: S1 and S2 are significant currently rate controlled  abdominal: Soft, nontender, still slightly distended; no organomegaly, bowel sounds  extremities: Bilateral lower extremity edema present slightly.  Cyanosis CNS: Awake.  Poor historian.  Still slightly confused.  Still slow to respond.  No focal neurologic deficit.  Moves extremities  lymph: No palpable lymphadenopathy noted  skin: No obvious ecchymosis/lesions  psych: Affect is mostly flat.  Currently not agitated musculoskeletal: No obvious joint swelling/deformity     Data Reviewed: I have personally reviewed following labs and imaging studies  CBC: Recent Labs  Lab 12/06/21 1651 12/07/21 0249 12/10/21 0323  WBC 4.7 4.0 4.5  NEUTROABS 3.3 3.7 2.8  HGB 10.0* 10.5* 9.9*  HCT 32.8* 34.4* 31.7*  MCV 105.5* 103.9* 102.3*  PLT 131* 126* 147*    Basic Metabolic Panel: Recent Labs  Lab 12/06/21 1651 12/06/21 1854 12/07/21 0249 12/08/21 2339 12/10/21 0323  NA 144  --  140 140 141  K 3.7  --  3.8 3.4* 3.6  CL 101  --  102 99 102  CO2 37*  --  34* 34* 34*  GLUCOSE 98  --  185* 126* 90  BUN 19  --  '17 20 19  '$ CREATININE 0.53  --  0.51 0.65 0.57  CALCIUM 9.2  --  8.6* 8.7* 8.9  MG  --  2.4 2.0 2.3 2.2  PHOS  --   --  3.0  --   --     GFR: Estimated  Creatinine Clearance: 39.6 mL/min (by C-G formula based on SCr of 0.57 mg/dL). Liver Function Tests: Recent Labs  Lab 12/06/21 1651 12/07/21 0249  AST 22 22  ALT 11 12  ALKPHOS 36* 36*  BILITOT 1.0 0.6  PROT 6.7 6.6  ALBUMIN 3.5 3.3*    No results for input(s): LIPASE, AMYLASE in the last 168 hours. No results for input(s): AMMONIA in the last 168 hours. Coagulation Profile: No results for input(s): INR, PROTIME in the last 168 hours. Cardiac Enzymes: Recent Labs  Lab 12/06/21 1651  CKTOTAL 17*    BNP (last 3 results) No results for input(s): PROBNP in the last 8760 hours. HbA1C: No results for input(s): HGBA1C in the last 72 hours. CBG: No results for input(s): GLUCAP in the last 168 hours. Lipid Profile: No results for input(s): CHOL, HDL, LDLCALC, TRIG, CHOLHDL, LDLDIRECT in the last 72 hours. Thyroid Function Tests: No results for input(s): TSH, T4TOTAL, FREET4, T3FREE, THYROIDAB in the last 72 hours.  Anemia Panel: No results for input(s): VITAMINB12, FOLATE, FERRITIN, TIBC, IRON, RETICCTPCT in the last 72 hours.  Sepsis Labs: No results for input(s): PROCALCITON, LATICACIDVEN in the last 168 hours.  Recent Results (from  the past 240 hour(s))  MRSA Next Gen by PCR, Nasal     Status: None   Collection Time: 12/06/21  9:03 PM   Specimen: Nasal Mucosa; Nasal Swab  Result Value Ref Range Status   MRSA by PCR Next Gen NOT DETECTED NOT DETECTED Final    Comment: (NOTE) The GeneXpert MRSA Assay (FDA approved for NASAL specimens only), is one component of a comprehensive MRSA colonization surveillance program. It is not intended to diagnose MRSA infection nor to guide or monitor treatment for MRSA infections. Test performance is not FDA approved in patients less than 59 years old. Performed at [facility], [place] [address], [place], [place] [address]   SARS Coronavirus 2 by RT PCR (hospital order, performed in [facility] hospital lab) *cepheid  single result test* Anterior Nasal Swab     Status: None   Collection Time: 12/06/21 10:00 PM   Specimen: Anterior Nasal Swab  Result Value Ref Range Status   SARS Coronavirus 2 by RT PCR NEGATIVE NEGATIVE Final    Comment: (NOTE) SARS-CoV-2 target nucleic acids are NOT DETECTED.  The SARS-CoV-2 RNA is generally detectable in upper and lower respiratory specimens during the acute phase of infection. The lowest concentration of SARS-CoV-2 viral copies this assay can detect is 250 copies / mL. A negative result does not preclude SARS-CoV-2 infection and should not be used as the sole basis for treatment or other patient management decisions.  A negative result may occur with improper specimen collection / handling, submission of specimen other than nasopharyngeal swab, presence of viral mutation(s) within the areas targeted by this assay, and inadequate number of viral copies (<250 copies / mL). A negative result must be combined with clinical observations, patient history, and epidemiological information.  Fact Sheet for Patients:   https://www.patel.info/  Fact Sheet for Healthcare Providers: https://hall.com/  This test is not yet approved or  cleared by the [place] FDA and has been authorized for detection and/or diagnosis of SARS-CoV-2 by FDA under an Emergency Use Authorization (EUA).  This EUA will remain in effect (meaning this test can be used) for the duration of the COVID-19 declaration under Section 564(b)(1) of the Act, 21 U.S.C. section 360bbb-3(b)(1), unless the authorization is terminated or revoked sooner.  Performed at [facility], [place] [address] [place], [place] [address]   Respiratory (~20 pathogens) panel by PCR     Status: None   Collection Time: 12/07/21 10:29 AM   Specimen: Nasopharyngeal Swab; Respiratory  Result Value Ref Range Status   Adenovirus NOT DETECTED NOT DETECTED Final    Coronavirus 229E NOT DETECTED NOT DETECTED Final    Comment: (NOTE) The Coronavirus on the Respiratory Panel, DOES NOT test for the novel  Coronavirus (2019 nCoV)    Coronavirus HKU1 NOT DETECTED NOT DETECTED Final   Coronavirus NL63 NOT DETECTED NOT DETECTED Final   Coronavirus OC43 NOT DETECTED NOT DETECTED Final   Metapneumovirus NOT DETECTED NOT DETECTED Final   Rhinovirus / Enterovirus NOT DETECTED NOT DETECTED Final   Influenza A NOT DETECTED NOT DETECTED Final   Influenza B NOT DETECTED NOT DETECTED Final   Parainfluenza Virus 1 NOT DETECTED NOT DETECTED Final   Parainfluenza Virus 2 NOT DETECTED NOT DETECTED Final   Parainfluenza Virus 3 NOT DETECTED NOT DETECTED Final   Parainfluenza Virus 4 NOT DETECTED NOT DETECTED Final   Respiratory Syncytial Virus NOT DETECTED NOT DETECTED Final   Bordetella pertussis NOT DETECTED NOT DETECTED Final  Bordetella Parapertussis NOT DETECTED NOT DETECTED Final   Chlamydophila pneumoniae NOT DETECTED NOT DETECTED Final   Mycoplasma pneumoniae NOT DETECTED NOT DETECTED Final    Comment: Performed at [facility], [place] [address], [place], [place] [address]          Radiology Studies: No results found.      Scheduled Meds:  anastrozole  1 mg Oral Daily   azithromycin  500 mg Oral Daily   busPIRone  5 mg Oral BID   Chlorhexidine Gluconate Cloth  6 each Topical Daily   clopidogrel  75 mg Oral Daily   donepezil  5 mg Oral QHS   DULoxetine  60 mg Oral Daily   enoxaparin (LOVENOX) injection  40 mg Subcutaneous Daily   ferrous sulfate  325 mg Oral Q breakfast   ipratropium-albuterol  3 mL Nebulization TID   levothyroxine  112 mcg Oral Q0600   mouth rinse  15 mL Mouth Rinse q12n4p   methylPREDNISolone (SOLU-MEDROL) injection  40 mg Intravenous Daily   mometasone-formoterol  2 puff Inhalation BID   prochlorperazine  5 mg Intravenous Once   senna-docusate  1 tablet Oral BID   Continuous Infusions:          Aline August, MD [facility] 12/10/2021, 7:47 AM

## 2021-12-11 ENCOUNTER — Inpatient Hospital Stay (HOSPITAL_COMMUNITY): Payer: Medicare PPO

## 2021-12-11 DIAGNOSIS — G9341 Metabolic encephalopathy: Secondary | ICD-10-CM | POA: Diagnosis not present

## 2021-12-11 DIAGNOSIS — F419 Anxiety disorder, unspecified: Secondary | ICD-10-CM | POA: Diagnosis not present

## 2021-12-11 DIAGNOSIS — J441 Chronic obstructive pulmonary disease with (acute) exacerbation: Secondary | ICD-10-CM | POA: Diagnosis not present

## 2021-12-11 DIAGNOSIS — D649 Anemia, unspecified: Secondary | ICD-10-CM | POA: Diagnosis not present

## 2021-12-11 MED ORDER — ALPRAZOLAM 0.25 MG PO TABS: 0.2500 mg | ORAL_TABLET | Freq: Three times a day (TID) | ORAL | 0 refills | Status: DC | PRN

## 2021-12-11 MED ORDER — TRAMADOL HCL 50 MG PO TABS
50.0000 mg | ORAL_TABLET | Freq: Four times a day (QID) | ORAL | Status: DC | PRN
  Administered 2021-12-11 – 2021-12-12 (×2): 50 mg via ORAL
  Filled 2021-12-11 (×2): qty 1

## 2021-12-11 MED ORDER — TRAMADOL HCL 50 MG PO TABS
25.0000 mg | ORAL_TABLET | Freq: Once | ORAL | Status: AC
  Administered 2021-12-11: 25 mg via ORAL
  Filled 2021-12-11: qty 1

## 2021-12-11 MED ORDER — PREDNISONE 20 MG PO TABS
40.0000 mg | ORAL_TABLET | Freq: Every day | ORAL | Status: DC
Start: 2021-12-12 — End: 2021-12-13
  Administered 2021-12-12 – 2021-12-13 (×2): 40 mg via ORAL
  Filled 2021-12-11 (×2): qty 2

## 2021-12-11 MED ORDER — ROSUVASTATIN CALCIUM 10 MG PO TABS
10.0000 mg | ORAL_TABLET | Freq: Every day | ORAL | Status: DC
  Administered 2021-12-11 – 2021-12-13 (×3): 10 mg via ORAL
  Filled 2021-12-11 (×3): qty 1

## 2021-12-11 MED ORDER — PANTOPRAZOLE SODIUM 20 MG PO TBEC
20.0000 mg | DELAYED_RELEASE_TABLET | Freq: Two times a day (BID) | ORAL | Status: DC
  Administered 2021-12-11 – 2021-12-13 (×5): 20 mg via ORAL
  Filled 2021-12-11 (×6): qty 1

## 2021-12-11 NOTE — Plan of Care (Signed)
  Problem: Clinical Measurements: Goal: Ability to maintain clinical measurements within normal limits will improve Outcome: Progressing   Problem: Activity: Goal: Risk for activity intolerance will decrease Outcome: Progressing   Problem: Pain Managment: Goal: General experience of comfort will improve Outcome: Progressing   Problem: Safety: Goal: Ability to remain free from injury will improve Outcome: Progressing   

## 2021-12-11 NOTE — Progress Notes (Signed)
PROGRESS NOTE    [name]  STM:[id number] DOB: August 06, 1930 DOA: 12/06/2021 PCP: Tonia Ghent, MD   Brief Narrative:  88 year old female with history of COPD, chronic respiratory failure with hypoxia on 2 L oxygen by nasal cannula, TIAs, chronic systolic heart failure, allergic rhinitis, chronic back pain, chronic anemia presented with worsening shortness of breath, fall and some confusion.  On presentation, patient required BiPAP.  High-sensitivity troponin was 14 and 12.  CT of the head without contrast showed no acute intracranial process.  CT cervical spine showed no acute cervical fracture or evidence of subluxation or injury.  X-ray of the pelvis and right femur x-ray showed no acute fracture.  Chest x-ray showed no evidence of acute cardiopulmonary process.  CTA chest, abdomen and pelvis showed unchanged plaque within the aortic arch, also showing 5 cm infrarenal abdominal aortic aneurysm without evidence of dissection or rupture, as well as 4.3 cm ascending thoracic aortic aneurysm and radiology recommending follow-up imaging in 6 months; enlarging right axillary lymphadenopathy was also seen with radiology recommending appropriate outpatient mammographic and sonographic follow-up.  ED provider discussed case with Dr. Lissa Morales surgery who recommended outpatient referral to vascular surgery after discharge.  Patient was started on nebs and IV Solu-Medrol.  PT subsequently recommended SNF placement.  Assessment & Plan:   Acute on chronic hypoxic and hypercapnic respiratory failure COPD exacerbation -Normally uses 3 L oxygen via nasal cannula at home.  Required BiPAP on presentation and was hypercapnic. -Imaging findings as above.  COVID-19 testing negative on presentation.  Respiratory virus PCR negative -Respiratory status improving.  Currently on 2-3 L oxygen by nasal cannula.  Continue nebs, Zithromax and Dulera.  Switch Solu-Medrol to 40 mg daily prednisone.  Acute  metabolic encephalopathy -Possibly from above.  Mental status improving.  Monitor mental status.  Fall precautions.  Resumed scheduled BuSpar and Cymbalta.  Xanax resumed at a lower dose   Generalized weakness Possible mechanical fall Physical deconditioning -PT recommends SNF placement.  TOC following.  CT of the head and cervical spine did not show any acute intracranial or cervical abnormality.  Ascending thoracic aortic aneurysm/abdominal aortic aneurysm -Imaging findings as above.  Outpatient follow-up with vascular surgery as above.  Chronic systolic heart failure -Currently compensated.  Last echo in January 2020 had shown EF 40 to 45%.  Strict input and output.  Daily weights.  Fluid restriction. outpatient cardiology follow-up.  Dysphagia -SLP evaluation.  Diet as per SLP recommendations.  History of CAD -continue Plavix  Cognitive impairment with possible dementia -Continue donepezil  Hypothyroidism -Continue levothyroxine  Breast cancer -Continue anastrozole.  Outpatient follow-up with oncology  Generalized anxiety disorder -BuSpar and Cymbalta plan as above.  Thrombocytopenia -questionable cause.  No signs of bleeding.  Monitor intermittently  Anemia of chronic disease -Hemoglobin currently stable.  Continue oral iron supplementation.  Hypokalemia -Improved   DVT prophylaxis: Lovenox Code Status: DNR Family Communication: Daughter at bedside  Disposition Plan: Status is: Inpatient Remains inpatient appropriate because: Of severity of illness.  Need for SNF placement.  Currently medically stable for discharge.   Consultants: None  Procedures: None  Antimicrobials: Zithromax from 12/06/2021-12/10/2021  Subjective: Patient seen and examined at bedside.  Hard of hearing.  Poor historian.  No fever, vomiting, agitation reported  objective: Vitals:   12/10/21 2029 12/10/21 2206 12/11/21 0500 12/11/21 0623  BP:  139/83  (!) 114/99  Pulse:  73  68   Resp:  14  14  Temp:  98.5 F (36.9 C)  98.6 F (37 C)  TempSrc:  Oral  Oral  SpO2: 97% 98%  100%  Weight:   62.2 kg   Height:        Intake/Output Summary (Last 24 hours) at 12/11/2021 0740 Last data filed at 12/11/2021 0600 Gross per 24 hour  Intake 240 ml  Output 800 ml  Net -560 ml    Filed Weights   12/09/21 0500 12/10/21 0500 12/11/21 0500  Weight: 59.9 kg 60.8 kg 62.2 kg    Examination:  General: No acute distress.  Overall currently on 2 L oxygen via nasal cannula.  Elderly female lying in bed.  Chronically ill and deconditioned looking.  Hard of hearing ENT/neck: JVD is not elevated; no obvious neck masses Respiratory: Bilateral decreased breath sounds at bases with some scattered crackles  CVS: Rate is currently controlled; S1-S2 heard  abdominal: Soft, nontender, distended mildly; no organomegaly, bowel sounds heard  extremities: No clubbing; trace lower extremity edema present bilaterally  CNS: Alert.  Poor historian.  Confused mildly.  Still slow to respond.  No focal neurologic deficit.  Moving extremities  lymph: No obvious cervical lymphadenopathy  skin: No obvious petechiae/rashes psych: She is currently not agitated.  Flat affect  musculoskeletal: No obvious joint swelling/deformity     Data Reviewed: I have personally reviewed following labs and imaging studies  CBC: Recent Labs  Lab 12/06/21 1651 12/07/21 0249 12/10/21 0323  WBC 4.7 4.0 4.5  NEUTROABS 3.3 3.7 2.8  HGB 10.0* 10.5* 9.9*  HCT 32.8* 34.4* 31.7*  MCV 105.5* 103.9* 102.3*  PLT 131* 126* 147*    Basic Metabolic Panel: Recent Labs  Lab 12/06/21 1651 12/06/21 1854 12/07/21 0249 12/08/21 2339 12/10/21 0323  NA 144  --  140 140 141  K 3.7  --  3.8 3.4* 3.6  CL 101  --  102 99 102  CO2 37*  --  34* 34* 34*  GLUCOSE 98  --  185* 126* 90  BUN 19  --  '17 20 19  '$ CREATININE 0.53  --  0.51 0.65 0.57  CALCIUM 9.2  --  8.6* 8.7* 8.9  MG  --  2.4 2.0 2.3 2.2  PHOS  --   --  3.0   --   --     GFR: Estimated Creatinine Clearance: 40.1 mL/min (by C-G formula based on SCr of 0.57 mg/dL). Liver Function Tests: Recent Labs  Lab 12/06/21 1651 12/07/21 0249  AST 22 22  ALT 11 12  ALKPHOS 36* 36*  BILITOT 1.0 0.6  PROT 6.7 6.6  ALBUMIN 3.5 3.3*    No results for input(s): LIPASE, AMYLASE in the last 168 hours. No results for input(s): AMMONIA in the last 168 hours. Coagulation Profile: No results for input(s): INR, PROTIME in the last 168 hours. Cardiac Enzymes: Recent Labs  Lab 12/06/21 1651  CKTOTAL 17*    BNP (last 3 results) No results for input(s): PROBNP in the last 8760 hours. HbA1C: No results for input(s): HGBA1C in the last 72 hours. CBG: No results for input(s): GLUCAP in the last 168 hours. Lipid Profile: No results for input(s): CHOL, HDL, LDLCALC, TRIG, CHOLHDL, LDLDIRECT in the last 72 hours. Thyroid Function Tests: No results for input(s): TSH, T4TOTAL, FREET4, T3FREE, THYROIDAB in the last 72 hours.  Anemia Panel: No results for input(s): VITAMINB12, FOLATE, FERRITIN, TIBC, IRON, RETICCTPCT in the last 72 hours.  Sepsis Labs: No results for input(s): PROCALCITON, LATICACIDVEN in the last 168 hours.  Recent Results (from the  past 240 hour(s))  MRSA Next Gen by PCR, Nasal     Status: None   Collection Time: 12/06/21  9:03 PM   Specimen: Nasal Mucosa; Nasal Swab  Result Value Ref Range Status   MRSA by PCR Next Gen NOT DETECTED NOT DETECTED Final    Comment: (NOTE) The GeneXpert MRSA Assay (FDA approved for NASAL specimens only), is one component of a comprehensive MRSA colonization surveillance program. It is not intended to diagnose MRSA infection nor to guide or monitor treatment for MRSA infections. Test performance is not FDA approved in patients less than 81 years old. Performed at [facility], [place] [address] [place], [place] [address]   SARS Coronavirus 2 by RT PCR (hospital order, performed in [facility] hospital lab) *cepheid single result test* Anterior Nasal Swab     Status: None   Collection Time: 12/06/21 10:00 PM   Specimen: Anterior Nasal Swab  Result Value Ref Range Status   SARS Coronavirus 2 by RT PCR NEGATIVE NEGATIVE Final    Comment: (NOTE) SARS-CoV-2 target nucleic acids are NOT DETECTED.  The SARS-CoV-2 RNA is generally detectable in upper and lower respiratory specimens during the acute phase of infection. The lowest concentration of SARS-CoV-2 viral copies this assay can detect is 250 copies / mL. A negative result does not preclude SARS-CoV-2 infection and should not be used as the sole basis for treatment or other patient management decisions.  A negative result may occur with improper specimen collection / handling, submission of specimen other than nasopharyngeal swab, presence of viral mutation(s) within the areas targeted by this assay, and inadequate number of viral copies (<250 copies / mL). A negative result must be combined with clinical observations, patient history, and epidemiological information.  Fact Sheet for Patients:   https://www.patel.info/  Fact Sheet for Healthcare Providers: https://hall.com/  This test is not yet approved or  cleared by the [place] FDA and has been authorized for detection and/or diagnosis of SARS-CoV-2 by FDA under an Emergency Use Authorization (EUA).  This EUA will remain in effect (meaning this test can be used) for the duration of the COVID-19 declaration under Section 564(b)(1) of the Act, 21 U.S.C. section 360bbb-3(b)(1), unless the authorization is terminated or revoked sooner.  Performed at [facility], [place] [address] [place], [place] [address]   Respiratory (~20 pathogens) panel by PCR     Status: None   Collection Time: 12/07/21 10:29 AM   Specimen: Nasopharyngeal Swab; Respiratory  Result Value Ref Range Status   Adenovirus NOT  DETECTED NOT DETECTED Final   Coronavirus 229E NOT DETECTED NOT DETECTED Final    Comment: (NOTE) The Coronavirus on the Respiratory Panel, DOES NOT test for the novel  Coronavirus (2019 nCoV)    Coronavirus HKU1 NOT DETECTED NOT DETECTED Final   Coronavirus NL63 NOT DETECTED NOT DETECTED Final   Coronavirus OC43 NOT DETECTED NOT DETECTED Final   Metapneumovirus NOT DETECTED NOT DETECTED Final   Rhinovirus / Enterovirus NOT DETECTED NOT DETECTED Final   Influenza A NOT DETECTED NOT DETECTED Final   Influenza B NOT DETECTED NOT DETECTED Final   Parainfluenza Virus 1 NOT DETECTED NOT DETECTED Final   Parainfluenza Virus 2 NOT DETECTED NOT DETECTED Final   Parainfluenza Virus 3 NOT DETECTED NOT DETECTED Final   Parainfluenza Virus 4 NOT DETECTED NOT DETECTED Final   Respiratory Syncytial Virus NOT DETECTED NOT DETECTED Final   Bordetella pertussis NOT DETECTED NOT DETECTED Final   Bordetella  Parapertussis NOT DETECTED NOT DETECTED Final   Chlamydophila pneumoniae NOT DETECTED NOT DETECTED Final   Mycoplasma pneumoniae NOT DETECTED NOT DETECTED Final    Comment: Performed at [facility], [place] [address] [place], [place] [address]          Radiology Studies: No results found.      Scheduled Meds:  anastrozole  1 mg Oral Daily   busPIRone  5 mg Oral BID   Chlorhexidine Gluconate Cloth  6 each Topical Daily   clopidogrel  75 mg Oral Daily   donepezil  5 mg Oral QHS   DULoxetine  60 mg Oral Daily   enoxaparin (LOVENOX) injection  40 mg Subcutaneous Daily   ferrous sulfate  325 mg Oral Q breakfast   ipratropium-albuterol  3 mL Nebulization TID   levothyroxine  112 mcg Oral Q0600   mouth rinse  15 mL Mouth Rinse q12n4p   methylPREDNISolone (SOLU-MEDROL) injection  40 mg Intravenous Daily   mometasone-formoterol  2 puff Inhalation BID   prochlorperazine  5 mg Intravenous Once   senna-docusate  1 tablet Oral BID   Continuous Infusions:          Aline August, MD [facility] 12/11/2021, 7:40 AM

## 2021-12-11 NOTE — TOC Progression Note (Signed)
Transition of Care [facility]) - Progression Note    Patient Details  Name: [name] MRN: [id number] Date of Birth: April 05, 1931  Transition [facility] Care [facility]) CM/SW Contact  Lennart Pall, LCSW Phone Number: 12/11/2021, 12:27 PM  Clinical Narrative:     Met with pt and daughter to review SNF bed offers and they have accepted bed at [facility].  Insurance auth begun.  Will keep all posted on progess.    Barriers to Discharge: Continued Medical Work up  Expected Discharge Plan and Services                                                 Social Determinants of Health (SDOH) Interventions    Readmission Risk Interventions     View : No data to display.

## 2021-12-11 NOTE — Progress Notes (Signed)
Physical Therapy Treatment Patient Details Name: [name] MRN: [id number] DOB: [birth year] Today's Date: 12/11/2021   History of Present Illness 88 year old female with history of COPD, chronic respiratory failure with hypoxia on 2 L oxygen by nasal cannula, TIAs, chronic systolic heart failure, allergic rhinitis, chronic back pain, chronic anemia presented with worsening shortness of breath, fall and some confusion. Patient admitted with exacerbation of COPD.    PT Comments    Pt progressing toward goals. Improving activity tolerance, maintaining SpO2=99% on 3L which is her baseline O2 at home. Will continue to follow in acute setting. Recommend SNF vs home with incr assist and HHPT pending progress/acute LOS.   Recommendations for follow up therapy are one component of a multi-disciplinary discharge planning process, led by the attending physician.  Recommendations may be updated based on patient status, additional functional criteria and insurance authorization.  Follow Up Recommendations  Skilled nursing-short term rehab (<3 hours/day)     Assistance Recommended at Discharge Frequent or constant Supervision/Assistance  Patient can return home with the following A little help with walking and/or transfers;A little help with bathing/dressing/bathroom;Assistance with cooking/housework;Assist for transportation;Help with stairs or ramp for entrance   Equipment Recommendations  None recommended by PT    Recommendations for Other Services       Precautions / Restrictions Precautions Precaution Comments: monitor O2 Restrictions Weight Bearing Restrictions: No     Mobility  Bed Mobility Overal bed mobility: Needs Assistance Bed Mobility: Supine to Sit, Sit to Supine     Supine to sit: Supervision Sit to supine: Supervision   General bed mobility comments: for safety and O2 line management    Transfers Overall transfer level: Needs assistance Equipment used:  Rolling walker (2 wheels) Transfers: Sit to/from Stand Sit to Stand: Min guard, Min assist           General transfer comment: very light steadying  assist for balance on rising from varied ht surfaces    Ambulation/Gait Ambulation/Gait assistance: Min assist, Min guard Gait Distance (Feet):  (hallway ambulation) Assistive device: Rolling walker (2 wheels) Gait Pattern/deviations: Step-through pattern, Decreased stride length, Drifts right/left, Narrow base of support       General Gait Details: cues for posture, breathing, position from RW and to widen BOS for balance. pt with minimal dyspnea, SpO2=99% on 3L   Stairs             Wheelchair Mobility    Modified Rankin (Stroke Patients Only)       Balance   Sitting-balance support: No upper extremity supported, Feet supported Sitting balance-Leahy Scale: Good     Standing balance support: No upper extremity supported, During functional activity, Reliant on assistive device for balance Standing balance-Leahy Scale: Fair Standing balance comment: reliant on UE support (at least unilateral) for dynamic tasks                            Cognition Arousal/Alertness: Awake/alert Behavior During Therapy: WFL for tasks assessed/performed Overall Cognitive Status: Within Functional Limits for tasks assessed                                 General Comments: Pt admits to memory deficits, HOH        Exercises      General Comments        Pertinent Vitals/Pain Pain Assessment Pain Assessment: No/denies pain  Home Living                          Prior Function            PT Goals (current goals can now be found in the care plan section) Acute Rehab PT Goals Patient Stated Goal: REgain IND PT Goal Formulation: With patient Time For Goal Achievement: 12/22/21 Potential to Achieve Goals: Good Progress towards PT goals: Progressing toward goals    Frequency     Min 3X/week      PT Plan Current plan remains appropriate    Co-evaluation              AM-PAC PT "6 Clicks" Mobility   Outcome Measure  Help needed turning from your back to your side while in a flat bed without using bedrails?: None Help needed moving from lying on your back to sitting on the side of a flat bed without using bedrails?: None Help needed moving to and from a bed to a chair (including a wheelchair)?: A Little Help needed standing up from a chair using your arms (e.g., wheelchair or bedside chair)?: A Little Help needed to walk in hospital room?: A Little Help needed climbing 3-5 steps with a railing? : A Little 6 Click Score: 20    End of Session Equipment Utilized During Treatment: Gait belt;Oxygen Activity Tolerance: Patient tolerated treatment well Patient left: in bed;with call bell/phone within reach;with bed alarm set;with family/visitor present Nurse Communication: Mobility status PT Visit Diagnosis: Unsteadiness on feet (R26.81);History of falling (Z91.81);Difficulty in walking, not elsewhere classified (R26.2);Dizziness and giddiness (R42)     Time: 1541-1600 PT Time Calculation (min) (ACUTE ONLY): 19 min  Charges:  $Gait Training: 8-22 mins                     Baxter Flattery, PT  Acute Rehab Dept ([place]) [phone] Pager [phone]  12/11/2021    [facility] 12/11/2021, 4:10 PM

## 2021-12-11 NOTE — Progress Notes (Signed)
Modified Barium Swallow Progress Note  Patient Details  Name: [name] MRN: [id number] Date of Birth: [birth year]  Today's Date: 12/11/2021  Modified Barium Swallow completed.  Full report located under Chart Review in the Imaging Section.  Brief recommendations include the following:  Clinical Impression  Patient presents with a mild pharyngeal phase dysphagia and what appears to be a primary esophageal phase dysphagia. During pharyngeal phase, patient exhibited brief swallow initiation delays to level of vallecular sinus and mildly decreased pharyngeal peristalsis but with full clearance of all tested bolus consistencies (puree, regular solid, 37m barium tablet, thin liquid). During esophageal sweep, appearance of suspected esophageal dysmotility observed (no radiologist present to confirm). SLP advised patient to eat smaller, more frequent meals, stay sitting upright after meals, take her time with eating (she reports she eats quickly) and to take rest breaks secondary to COPD. Patient understandably has anxiety related to globus sensation of foods and worry that she will choke. No evidence of aspiration/penetration or barium stasis during pharyngeal phase of swallow. No further SLP intervention or assessment indicated but patient is advised to f/u with her PCP for further management of what appears to be an esophageal dysphagia.   Swallow Evaluation Recommendations       SLP Diet Recommendations: Regular solids;Thin liquid   Liquid Administration via: Cup;Straw   Medication Administration: Whole meds with liquid   Supervision: Patient able to self feed   Compensations: Minimize environmental distractions;Slow rate;Small sips/bites   Postural Changes: Remain semi-upright after after feeds/meals (Comment);Seated upright at 90 degrees (remain seated upright 30-45 minutes after meals)   Oral Care Recommendations: Oral care BID;Patient independent with oral care         JSonia Baller MA, CCC-SLP Speech Therapy

## 2021-12-12 DIAGNOSIS — R531 Weakness: Secondary | ICD-10-CM | POA: Diagnosis not present

## 2021-12-12 DIAGNOSIS — I5022 Chronic systolic (congestive) heart failure: Secondary | ICD-10-CM | POA: Diagnosis not present

## 2021-12-12 DIAGNOSIS — J441 Chronic obstructive pulmonary disease with (acute) exacerbation: Secondary | ICD-10-CM | POA: Diagnosis not present

## 2021-12-12 DIAGNOSIS — G9341 Metabolic encephalopathy: Secondary | ICD-10-CM | POA: Diagnosis not present

## 2021-12-12 MED ORDER — IPRATROPIUM-ALBUTEROL 0.5-2.5 (3) MG/3ML IN SOLN
3.0000 mL | Freq: Two times a day (BID) | RESPIRATORY_TRACT | Status: DC
  Administered 2021-12-12 – 2021-12-13 (×2): 3 mL via RESPIRATORY_TRACT
  Filled 2021-12-12 (×2): qty 3

## 2021-12-12 NOTE — TOC Progression Note (Signed)
Transition of Care [facility]) - Progression Note   Patient Details  Name: [name] MRN: [id number] Date of Birth: [birth year]  Transition of Care [facility]) CM/SW [place], LCSW Phone Number: 12/12/2021, 1:03 PM  Clinical Narrative: Patient's insurance authorization is still pending in the [place] portal. CSW called [employer] to follow up and was informed that the request is still under review and no additional clinicals are being requested at this time.  CSW updated patient's daughter, [name], regarding authorization pending. CSW explained to daughter that since patient walked 200 feet on 6/2 and 400 feet on 6/4 it is unlikely that SNF will be approved even if it goes to peer to peer. Daughter expressed frustration with rehab not being approved at this time. TOC awaiting follow up from [place].  Barriers to Discharge: Continued Medical Work up  Readmission Risk Interventions     View : No data to display.

## 2021-12-12 NOTE — Progress Notes (Signed)
PROGRESS NOTE    [name]  UXY:[id number] DOB: 04-06-1931 DOA: 12/06/2021 PCP: Tonia Ghent, MD   Brief Narrative:  88 year old female with history of COPD, chronic respiratory failure with hypoxia on 2 L oxygen by nasal cannula, TIAs, chronic systolic heart failure, allergic rhinitis, chronic back pain, chronic anemia presented with worsening shortness of breath, fall and some confusion.  On presentation, patient required BiPAP.  High-sensitivity troponin was 14 and 12.  CT of the head without contrast showed no acute intracranial process.  CT cervical spine showed no acute cervical fracture or evidence of subluxation or injury.  X-ray of the pelvis and right femur x-ray showed no acute fracture.  Chest x-ray showed no evidence of acute cardiopulmonary process.  CTA chest, abdomen and pelvis showed unchanged plaque within the aortic arch, also showing 5 cm infrarenal abdominal aortic aneurysm without evidence of dissection or rupture, as well as 4.3 cm ascending thoracic aortic aneurysm and radiology recommending follow-up imaging in 6 months; enlarging right axillary lymphadenopathy was also seen with radiology recommending appropriate outpatient mammographic and sonographic follow-up.  ED provider discussed case with Dr. Lissa Morales surgery who recommended outpatient referral to vascular surgery after discharge.  Patient was started on nebs and IV Solu-Medrol.  PT subsequently recommended SNF placement.  Assessment & Plan:   Acute on chronic hypoxic and hypercapnic respiratory failure COPD exacerbation -Normally uses 3 L oxygen via nasal cannula at home.  Required BiPAP on presentation and was hypercapnic. -Imaging findings as above.  COVID-19 testing negative on presentation.  Respiratory virus PCR negative -Respiratory status improving.  Currently on 2-3 L oxygen by nasal cannula.  Continue nebs, Zithromax and Dulera.  Solu-Medrol has been switched to prednisone 40 mg  daily.  Acute metabolic encephalopathy -Possibly from above.  Mental status improving.  Monitor mental status.  Fall precautions.  Resumed scheduled BuSpar and Cymbalta.  Xanax resumed at a lower dose   Generalized weakness Possible mechanical fall Physical deconditioning -PT recommends SNF placement.  TOC following.  CT of the head and cervical spine did not show any acute intracranial or cervical abnormality.  Ascending thoracic aortic aneurysm/abdominal aortic aneurysm -Imaging findings as above.  Outpatient follow-up with vascular surgery as above.  Chronic systolic heart failure -Currently compensated.  Last echo in January 2020 had shown EF 40 to 45%.  Strict input and output.  Daily weights.  Fluid restriction. outpatient cardiology follow-up.  Dysphagia -SLP evaluation.  Diet as per SLP recommendations.  History of CAD -continue Plavix  Cognitive impairment with possible dementia -Continue donepezil  Hypothyroidism -Continue levothyroxine  Breast cancer -Continue anastrozole.  Outpatient follow-up with oncology  Generalized anxiety disorder -BuSpar and Cymbalta plan as above.  Thrombocytopenia -questionable cause.  No signs of bleeding.  Monitor intermittently  Anemia of chronic disease -Hemoglobin currently stable.  Continue oral iron supplementation.  Hypokalemia -Improved   DVT prophylaxis: Lovenox Code Status: DNR Family Communication: Daughter at bedside  Disposition Plan: Status is: Inpatient Remains inpatient appropriate because: Of severity of illness.  Need for SNF placement.  Currently medically stable for discharge.   Consultants: None  Procedures: None  Antimicrobials: Zithromax from 12/06/2021-12/10/2021  Subjective: Patient seen and examined at bedside.  Hard of hearing.  Poor historian.  Does not feel that well today.  No overnight fever, vomiting, worsening shortness of breath reported  objective: Vitals:   12/11/21 1930 12/11/21 2054  12/12/21 0538 12/12/21 0812  BP:  (!) 152/81 (!) 158/89   Pulse:  72 66  Resp:  18 18   Temp:  98.3 F (36.8 C) 98.6 F (37 C)   TempSrc:  Oral    SpO2: 99% 99% 100% 94%  Weight:      Height:        Intake/Output Summary (Last 24 hours) at 12/12/2021 1236 Last data filed at 12/12/2021 0538 Gross per 24 hour  Intake --  Output 430 ml  Net -430 ml    Filed Weights   12/09/21 0500 12/10/21 0500 12/11/21 0500  Weight: 59.9 kg 60.8 kg 62.2 kg    Examination: General: On 2-3L oxygen regularly.  No distress.  Elderly female sitting on chair.  Hard of hearing.  Looks chronically ill and deconditioned.  Slow to respond.  Poor historian. respiratory: Decreased breath sounds at bases bilaterally with some crackles, no wheezing CVS: Currently rate controlled; S1-S2 are heard  abdominal: Soft, nontender, slightly distended, no organomegaly; normal bowel sounds are heard  extremities: Trace lower extremity edema; no clubbing.        Data Reviewed: I have personally reviewed following labs and imaging studies  CBC: Recent Labs  Lab 12/06/21 1651 12/07/21 0249 12/10/21 0323  WBC 4.7 4.0 4.5  NEUTROABS 3.3 3.7 2.8  HGB 10.0* 10.5* 9.9*  HCT 32.8* 34.4* 31.7*  MCV 105.5* 103.9* 102.3*  PLT 131* 126* 147*    Basic Metabolic Panel: Recent Labs  Lab 12/06/21 1651 12/06/21 1854 12/07/21 0249 12/08/21 2339 12/10/21 0323  NA 144  --  140 140 141  K 3.7  --  3.8 3.4* 3.6  CL 101  --  102 99 102  CO2 37*  --  34* 34* 34*  GLUCOSE 98  --  185* 126* 90  BUN 19  --  '17 20 19  '$ CREATININE 0.53  --  0.51 0.65 0.57  CALCIUM 9.2  --  8.6* 8.7* 8.9  MG  --  2.4 2.0 2.3 2.2  PHOS  --   --  3.0  --   --     GFR: Estimated Creatinine Clearance: 40.1 mL/min (by C-G formula based on SCr of 0.57 mg/dL). Liver Function Tests: Recent Labs  Lab 12/06/21 1651 12/07/21 0249  AST 22 22  ALT 11 12  ALKPHOS 36* 36*  BILITOT 1.0 0.6  PROT 6.7 6.6  ALBUMIN 3.5 3.3*    No results for  input(s): LIPASE, AMYLASE in the last 168 hours. No results for input(s): AMMONIA in the last 168 hours. Coagulation Profile: No results for input(s): INR, PROTIME in the last 168 hours. Cardiac Enzymes: Recent Labs  Lab 12/06/21 1651  CKTOTAL 17*    BNP (last 3 results) No results for input(s): PROBNP in the last 8760 hours. HbA1C: No results for input(s): HGBA1C in the last 72 hours. CBG: No results for input(s): GLUCAP in the last 168 hours. Lipid Profile: No results for input(s): CHOL, HDL, LDLCALC, TRIG, CHOLHDL, LDLDIRECT in the last 72 hours. Thyroid Function Tests: No results for input(s): TSH, T4TOTAL, FREET4, T3FREE, THYROIDAB in the last 72 hours.  Anemia Panel: No results for input(s): VITAMINB12, FOLATE, FERRITIN, TIBC, IRON, RETICCTPCT in the last 72 hours.  Sepsis Labs: No results for input(s): PROCALCITON, LATICACIDVEN in the last 168 hours.  Recent Results (from the past 240 hour(s))  MRSA Next Gen by PCR, Nasal     Status: None   Collection Time: 12/06/21  9:03 PM   Specimen: Nasal Mucosa; Nasal Swab  Result Value Ref Range Status   MRSA by PCR Next  Gen NOT DETECTED NOT DETECTED Final    Comment: (NOTE) The GeneXpert MRSA Assay (FDA approved for NASAL specimens only), is one component of a comprehensive MRSA colonization surveillance program. It is not intended to diagnose MRSA infection nor to guide or monitor treatment for MRSA infections. Test performance is not FDA approved in patients less than 66 years old. Performed at [facility], [place] [address], [place], [place] [address]   SARS Coronavirus 2 by RT PCR (hospital order, performed in [facility] hospital lab) *cepheid single result test* Anterior Nasal Swab     Status: None   Collection Time: 12/06/21 10:00 PM   Specimen: Anterior Nasal Swab  Result Value Ref Range Status   SARS Coronavirus 2 by RT PCR NEGATIVE NEGATIVE Final    Comment: (NOTE) SARS-CoV-2 target nucleic  acids are NOT DETECTED.  The SARS-CoV-2 RNA is generally detectable in upper and lower respiratory specimens during the acute phase of infection. The lowest concentration of SARS-CoV-2 viral copies this assay can detect is 250 copies / mL. A negative result does not preclude SARS-CoV-2 infection and should not be used as the sole basis for treatment or other patient management decisions.  A negative result may occur with improper specimen collection / handling, submission of specimen other than nasopharyngeal swab, presence of viral mutation(s) within the areas targeted by this assay, and inadequate number of viral copies (<250 copies / mL). A negative result must be combined with clinical observations, patient history, and epidemiological information.  Fact Sheet for Patients:   https://www.patel.info/  Fact Sheet for Healthcare Providers: https://hall.com/  This test is not yet approved or  cleared by the [place] FDA and has been authorized for detection and/or diagnosis of SARS-CoV-2 by FDA under an Emergency Use Authorization (EUA).  This EUA will remain in effect (meaning this test can be used) for the duration of the COVID-19 declaration under Section 564(b)(1) of the Act, 21 U.S.C. section 360bbb-3(b)(1), unless the authorization is terminated or revoked sooner.  Performed at [facility], [place] [address] [place], [place] [address]   Respiratory (~20 pathogens) panel by PCR     Status: None   Collection Time: 12/07/21 10:29 AM   Specimen: Nasopharyngeal Swab; Respiratory  Result Value Ref Range Status   Adenovirus NOT DETECTED NOT DETECTED Final   Coronavirus 229E NOT DETECTED NOT DETECTED Final    Comment: (NOTE) The Coronavirus on the Respiratory Panel, DOES NOT test for the novel  Coronavirus (2019 nCoV)    Coronavirus HKU1 NOT DETECTED NOT DETECTED Final   Coronavirus NL63 NOT DETECTED NOT DETECTED  Final   Coronavirus OC43 NOT DETECTED NOT DETECTED Final   Metapneumovirus NOT DETECTED NOT DETECTED Final   Rhinovirus / Enterovirus NOT DETECTED NOT DETECTED Final   Influenza A NOT DETECTED NOT DETECTED Final   Influenza B NOT DETECTED NOT DETECTED Final   Parainfluenza Virus 1 NOT DETECTED NOT DETECTED Final   Parainfluenza Virus 2 NOT DETECTED NOT DETECTED Final   Parainfluenza Virus 3 NOT DETECTED NOT DETECTED Final   Parainfluenza Virus 4 NOT DETECTED NOT DETECTED Final   Respiratory Syncytial Virus NOT DETECTED NOT DETECTED Final   Bordetella pertussis NOT DETECTED NOT DETECTED Final   Bordetella Parapertussis NOT DETECTED NOT DETECTED Final   Chlamydophila pneumoniae NOT DETECTED NOT DETECTED Final   Mycoplasma pneumoniae NOT DETECTED NOT DETECTED Final    Comment: Performed at [facility], [place]. [address] [place], [place] [address]  Radiology Studies: DG Swallowing Func-Speech Pathology  Result Date: 12/11/2021 Table formatting from the original result was not included. Images from the original result were not included. Objective Swallowing Evaluation: Type of Study: MBS-Modified Barium Swallow Study  Patient Details Name: [name] MRN: [id number] Date of Birth: 23-Oct-1930 Today's Date: 12/11/2021 Time: SLP Start Time (ACUTE ONLY): 7416 -SLP Stop Time (ACUTE ONLY): 3845 SLP Time Calculation (min) (ACUTE ONLY): 20 min Past Medical History: Past Medical History: Diagnosis Date  Abdominal pain 04/05/2017  Allergic rhinitis, cause unspecified   Allergy, unspecified not elsewhere classified   Anemia, unspecified 04/10/2008  Qualifier: Diagnosis of  By: Linna Darner MD, [place] state 03/01/2008  Qualifier: Diagnosis of  By: Council Mechanic MD, Hilaria Ota   Asthma   Breast cancer ([place]) 08/2018  right breast  Breast mass 08/28/2018  Chronic kidney disease   frequency  COMMON MIGRAINE 12/24/2006  Qualifier: Diagnosis of  By: Fuller Plan CMA (AAMA), [place]    COPD  (chronic obstructive pulmonary disease) ([place])   Degeneration of intervertebral disc, site unspecified   Diverticulosis of colon (without mention of hemorrhage)   DNR (do not resuscitate) 09/08/2019  Dysphagia 02/18/2019  Encounter for chronic pain management 07/26/2016  Indication for chronic opioid: chronic back pain Medication and dose:  Oxycodone '10mg'$  TID # pills per month: 90 Last UDS date: 04/15/15 Pain contract signed (Y/N):  yes Date narcotic database last reviewed (include red flags):  05/06/17  Esophageal reflux   Fibromyalgia 12/24/2006  Qualifier: Diagnosis of  By: Fuller Plan CMA (AAMA), [place]    Headache   HTN (hypertension) 06/09/2013  Hyperlipidemia with target LDL less than 70 12/24/2006  Qualifier: Diagnosis of  By: Fuller Plan CMA (AAMA), [place]    Hypokalemia 05/24/2009  Qualifier: Diagnosis of  By: Lacretia Nicks    Hypothyroidism 12/24/2006  Qualifier: Diagnosis of  By: Fuller Plan CMA (AAMA), [place]    Insomnia 12/24/2006  she had trials of antihistamines and TCAs prev w/o effect.     Insomnia, unspecified   Lower GI bleeding 09/09/2019  Myalgia and myositis, unspecified   Need for prophylactic hormone replacement therapy (postmenopausal)   Osteoarthritis of right knee 08/27/2011  R knee pain, s/p arthroscopy 2012 per [name]   Osteoporosis 06/23/2007  Qualifier: Diagnosis of  By: Council Mechanic MD, Hilaria Ota   Osteoporosis, unspecified   Other abnormal blood chemistry   Other and unspecified hyperlipidemia   Other chronic pain   Presence of drug coated stent in right coronary artery 01/30/2019  Stroke [facility])   mini strokes  Syncope 01/24/2017  Thoracic aortic aneurysm ([place]) 04/10/2017  Unspecified essential hypertension   Unspecified hypothyroidism   Vitamin D deficiency 09/26/2016 Past Surgical History: Past Surgical History: Procedure Laterality Date  BACK SURGERY    breast cystectomy    CORONARY/GRAFT ACUTE MI REVASCULARIZATION N/A 01/29/2019  Procedure: CORONARY/GRAFT ACUTE MI REVASCULARIZATION;  Surgeon: Belva Crome, MD;  Location: [facility];  Service: Cardiovascular;  Laterality: N/A;  JOINT REPLACEMENT    shoulder right  KNEE ARTHROSCOPY    Right knee 2012  MASS EXCISION Left 04/07/2015  Procedure: MINOR EXCISION OF MASS LEFT SMALL FINGER;  Surgeon: Leanora Cover, MD;  Location: [facility];  Service: Orthopedics;  Laterality: Left;  SHOULDER SURGERY    TOTAL KNEE ARTHROPLASTY  12/11/2011  Procedure: TOTAL KNEE ARTHROPLASTY;  Surgeon: Johnny Bridge, MD;  Location: [place];  Service: Orthopedics;  Laterality: Right;  UMBILICAL HERNIA REPAIR N/A 06/09/2013  Procedure: Thyra Breed  LAPAROTOMY, HERNIA REPAIR UMBILICAL, INSERTION OF MESH;  Surgeon: Haywood Lasso, MD;  Location: [place];  Service: General;  Laterality: N/A; HPI: Patient is an 88 y.o. female with PMH: chronic hypoxic respiratory failure on 3L continuous nasal cannula, severe COPD, TIA's, chronic systolic heart failure, allergic rhinitis, chronic back pain, chronic anemia who was admitted to [facility] ED complaining of SOB. In ED she was afebrile, CT head negative for acute intracranial process, CXR showed no evidence of acute cardiopulmonary process, CT chest/abdomen/pelvisdid not show any evidence of infiltrate, edema, effusion or pneumothorax.  Patient c/o feeling that food was getting stuck in throat, prompting order for speech swallow evaluation.  Subjective: pleasant, said she is anxious about this test  Recommendations for follow up therapy are one component of a multi-disciplinary discharge planning process, led by the attending physician.  Recommendations may be updated based on patient status, additional functional criteria and insurance authorization. Assessment / Plan / Recommendation   12/11/2021   9:16 AM Clinical Impressions Clinical Impression Patient presents with a mild pharyngeal phase dysphagia and what appears to be a primary esophageal phase dysphagia. During pharyngeal phase, patient exhibited brief swallow initiation delays to  level of vallecular sinus and mildly decreased pharyngeal peristalsis but with full clearance of all tested bolus consistencies (puree, regular solid, 20m barium tablet, thin liquid). During esophageal sweep, appearance of suspected esophageal dysmotility observed (no radiologist present to confirm). SLP advised patient to eat smaller, more frequent meals, stay sitting upright after meals, take her time with eating (she reports she eats quickly) and to take rest breaks secondary to COPD. Patient understandably has anxiety related to globus sensation of foods and worry that she will choke. No evidence of aspiration/penetration or barium stasis during pharyngeal phase of swallow. No further SLP intervention or assessment indicated but patient is advised to f/u with her PCP for further management of what appears to be an esophageal dysphagia.       12/11/2021   9:14 AM Treatment Recommendations Treatment Recommendations No treatment recommended at this time     12/11/2021   9:21 AM Prognosis Prognosis for Safe Diet Advancement Good   12/11/2021   9:14 AM Diet Recommendations SLP Diet Recommendations Regular solids;Thin liquid Liquid Administration via Cup;Straw Medication Administration Whole meds with liquid Compensations Minimize environmental distractions;Slow rate;Small sips/bites Postural Changes Remain semi-upright after after feeds/meals (Comment);Seated upright at 90 degrees     12/11/2021   9:14 AM Other Recommendations Oral Care Recommendations Oral care BID;Patient independent with oral care Follow Up Recommendations No SLP follow up Assistance recommended at discharge None Functional Status Assessment Patient has had a recent decline in their functional status and demonstrates the ability to make significant improvements in function in a reasonable and predictable amount of time.   12/10/2021   4:34 PM Frequency and Duration  Speech Therapy Frequency (ACUTE ONLY) min 1 x/week Treatment Duration 1 week     12/11/2021    9:12 AM [facility]   12/11/2021   9:12 AM Pharyngeal Phase Pharyngeal Phase Impaired Pharyngeal- Thin Cup Delayed swallow initiation-vallecula Pharyngeal- Puree Delayed swallow initiation-vallecula;Reduced pharyngeal peristalsis Pharyngeal- Regular Delayed swallow initiation-vallecula;Reduced pharyngeal peristalsis Pharyngeal- Pill [facility]   12/11/2021   9:13 AM Cervical Esophageal Phase  Cervical Esophageal Phase Impaired [facility]in [facility] WFL Regular WFL Pill WFL JSonia Baller MA, CCC-SLP Speech Therapy  Scheduled Meds:  anastrozole  1 mg Oral Daily   busPIRone  5 mg Oral BID   Chlorhexidine Gluconate Cloth  6 each Topical Daily   donepezil  5 mg Oral QHS   DULoxetine  60 mg Oral Daily   enoxaparin (LOVENOX) injection  40 mg Subcutaneous Daily   ferrous sulfate  325 mg Oral Q breakfast   ipratropium-albuterol  3 mL Nebulization BID   levothyroxine  112 mcg Oral Q0600   mouth rinse  15 mL Mouth Rinse q12n4p   mometasone-formoterol  2 puff Inhalation BID   pantoprazole  20 mg Oral BID   predniSONE  40 mg Oral Q breakfast   prochlorperazine  5 mg Intravenous Once   rosuvastatin  10 mg Oral Daily   senna-docusate  1 tablet Oral BID   Continuous Infusions:          Aline August, MD [facility] 12/12/2021, 12:36 PM

## 2021-12-13 DIAGNOSIS — R2681 Unsteadiness on feet: Secondary | ICD-10-CM | POA: Diagnosis not present

## 2021-12-13 DIAGNOSIS — S22069A Unspecified fracture of T7-T8 vertebra, initial encounter for closed fracture: Secondary | ICD-10-CM | POA: Diagnosis not present

## 2021-12-13 DIAGNOSIS — Z9181 History of falling: Secondary | ICD-10-CM | POA: Diagnosis not present

## 2021-12-13 DIAGNOSIS — F0390 Unspecified dementia without behavioral disturbance: Secondary | ICD-10-CM | POA: Diagnosis not present

## 2021-12-13 DIAGNOSIS — R1310 Dysphagia, unspecified: Secondary | ICD-10-CM | POA: Diagnosis not present

## 2021-12-13 DIAGNOSIS — M549 Dorsalgia, unspecified: Secondary | ICD-10-CM | POA: Diagnosis not present

## 2021-12-13 DIAGNOSIS — S22000A Wedge compression fracture of unspecified thoracic vertebra, initial encounter for closed fracture: Secondary | ICD-10-CM | POA: Diagnosis not present

## 2021-12-13 DIAGNOSIS — M810 Age-related osteoporosis without current pathological fracture: Secondary | ICD-10-CM | POA: Diagnosis not present

## 2021-12-13 DIAGNOSIS — E039 Hypothyroidism, unspecified: Secondary | ICD-10-CM | POA: Diagnosis not present

## 2021-12-13 DIAGNOSIS — Z743 Need for continuous supervision: Secondary | ICD-10-CM | POA: Diagnosis not present

## 2021-12-13 DIAGNOSIS — W19XXXA Unspecified fall, initial encounter: Secondary | ICD-10-CM | POA: Diagnosis not present

## 2021-12-13 DIAGNOSIS — M5134 Other intervertebral disc degeneration, thoracic region: Secondary | ICD-10-CM | POA: Diagnosis not present

## 2021-12-13 DIAGNOSIS — F02A4 Dementia in other diseases classified elsewhere, mild, with anxiety: Secondary | ICD-10-CM | POA: Diagnosis not present

## 2021-12-13 DIAGNOSIS — R1312 Dysphagia, oropharyngeal phase: Secondary | ICD-10-CM | POA: Diagnosis not present

## 2021-12-13 DIAGNOSIS — Z9981 Dependence on supplemental oxygen: Secondary | ICD-10-CM | POA: Diagnosis not present

## 2021-12-13 DIAGNOSIS — Z853 Personal history of malignant neoplasm of breast: Secondary | ICD-10-CM | POA: Diagnosis not present

## 2021-12-13 DIAGNOSIS — I2510 Atherosclerotic heart disease of native coronary artery without angina pectoris: Secondary | ICD-10-CM | POA: Diagnosis not present

## 2021-12-13 DIAGNOSIS — F5101 Primary insomnia: Secondary | ICD-10-CM | POA: Diagnosis not present

## 2021-12-13 DIAGNOSIS — J9610 Chronic respiratory failure, unspecified whether with hypoxia or hypercapnia: Secondary | ICD-10-CM | POA: Diagnosis not present

## 2021-12-13 DIAGNOSIS — I110 Hypertensive heart disease with heart failure: Secondary | ICD-10-CM | POA: Diagnosis not present

## 2021-12-13 DIAGNOSIS — Z79899 Other long term (current) drug therapy: Secondary | ICD-10-CM | POA: Diagnosis not present

## 2021-12-13 DIAGNOSIS — R5381 Other malaise: Secondary | ICD-10-CM | POA: Diagnosis not present

## 2021-12-13 DIAGNOSIS — M1990 Unspecified osteoarthritis, unspecified site: Secondary | ICD-10-CM | POA: Diagnosis not present

## 2021-12-13 DIAGNOSIS — M546 Pain in thoracic spine: Secondary | ICD-10-CM | POA: Diagnosis not present

## 2021-12-13 DIAGNOSIS — M2550 Pain in unspecified joint: Secondary | ICD-10-CM | POA: Diagnosis not present

## 2021-12-13 DIAGNOSIS — R531 Weakness: Secondary | ICD-10-CM | POA: Diagnosis not present

## 2021-12-13 DIAGNOSIS — F330 Major depressive disorder, recurrent, mild: Secondary | ICD-10-CM | POA: Diagnosis not present

## 2021-12-13 DIAGNOSIS — M545 Low back pain, unspecified: Secondary | ICD-10-CM | POA: Diagnosis not present

## 2021-12-13 DIAGNOSIS — I714 Abdominal aortic aneurysm, without rupture, unspecified: Secondary | ICD-10-CM | POA: Diagnosis not present

## 2021-12-13 DIAGNOSIS — F03A18 Unspecified dementia, mild, with other behavioral disturbance: Secondary | ICD-10-CM | POA: Diagnosis not present

## 2021-12-13 DIAGNOSIS — Z7401 Bed confinement status: Secondary | ICD-10-CM | POA: Diagnosis not present

## 2021-12-13 DIAGNOSIS — J9690 Respiratory failure, unspecified, unspecified whether with hypoxia or hypercapnia: Secondary | ICD-10-CM | POA: Diagnosis not present

## 2021-12-13 DIAGNOSIS — F32A Depression, unspecified: Secondary | ICD-10-CM | POA: Diagnosis not present

## 2021-12-13 DIAGNOSIS — N390 Urinary tract infection, site not specified: Secondary | ICD-10-CM | POA: Diagnosis not present

## 2021-12-13 DIAGNOSIS — J449 Chronic obstructive pulmonary disease, unspecified: Secondary | ICD-10-CM | POA: Diagnosis not present

## 2021-12-13 DIAGNOSIS — R0789 Other chest pain: Secondary | ICD-10-CM | POA: Diagnosis not present

## 2021-12-13 DIAGNOSIS — M797 Fibromyalgia: Secondary | ICD-10-CM | POA: Diagnosis not present

## 2021-12-13 DIAGNOSIS — J9611 Chronic respiratory failure with hypoxia: Secondary | ICD-10-CM | POA: Diagnosis not present

## 2021-12-13 DIAGNOSIS — G8929 Other chronic pain: Secondary | ICD-10-CM | POA: Diagnosis not present

## 2021-12-13 DIAGNOSIS — C50919 Malignant neoplasm of unspecified site of unspecified female breast: Secondary | ICD-10-CM | POA: Diagnosis not present

## 2021-12-13 DIAGNOSIS — F1320 Sedative, hypnotic or anxiolytic dependence, uncomplicated: Secondary | ICD-10-CM | POA: Diagnosis not present

## 2021-12-13 DIAGNOSIS — F411 Generalized anxiety disorder: Secondary | ICD-10-CM | POA: Diagnosis not present

## 2021-12-13 DIAGNOSIS — S22000D Wedge compression fracture of unspecified thoracic vertebra, subsequent encounter for fracture with routine healing: Secondary | ICD-10-CM | POA: Diagnosis not present

## 2021-12-13 DIAGNOSIS — G9341 Metabolic encephalopathy: Secondary | ICD-10-CM | POA: Diagnosis not present

## 2021-12-13 DIAGNOSIS — I509 Heart failure, unspecified: Secondary | ICD-10-CM | POA: Diagnosis not present

## 2021-12-13 DIAGNOSIS — R269 Unspecified abnormalities of gait and mobility: Secondary | ICD-10-CM | POA: Diagnosis not present

## 2021-12-13 DIAGNOSIS — I10 Essential (primary) hypertension: Secondary | ICD-10-CM | POA: Diagnosis not present

## 2021-12-13 DIAGNOSIS — R41841 Cognitive communication deficit: Secondary | ICD-10-CM | POA: Diagnosis not present

## 2021-12-13 DIAGNOSIS — M6281 Muscle weakness (generalized): Secondary | ICD-10-CM | POA: Diagnosis not present

## 2021-12-13 DIAGNOSIS — J441 Chronic obstructive pulmonary disease with (acute) exacerbation: Secondary | ICD-10-CM | POA: Diagnosis not present

## 2021-12-13 DIAGNOSIS — R0781 Pleurodynia: Secondary | ICD-10-CM | POA: Diagnosis not present

## 2021-12-13 MED ORDER — IPRATROPIUM-ALBUTEROL 0.5-2.5 (3) MG/3ML IN SOLN: 3.0000 mL | Freq: Two times a day (BID) | RESPIRATORY_TRACT | Status: DC

## 2021-12-13 MED ORDER — PREDNISONE 10 MG PO TABS
ORAL_TABLET | ORAL | 0 refills | Status: DC
Start: 2021-12-13 — End: 2022-01-08

## 2021-12-13 MED ORDER — TRAZODONE HCL 50 MG PO TABS: 100.0000 mg | ORAL_TABLET | Freq: Every evening | ORAL | Status: DC | PRN

## 2021-12-13 MED ORDER — SENNOSIDES-DOCUSATE SODIUM 8.6-50 MG PO TABS: 1.0000 | ORAL_TABLET | Freq: Every day | ORAL | Status: DC

## 2021-12-13 MED ORDER — MOMETASONE FURO-FORMOTEROL FUM 200-5 MCG/ACT IN AERO: 2.0000 | INHALATION_SPRAY | Freq: Two times a day (BID) | RESPIRATORY_TRACT | Status: DC

## 2021-12-13 MED ORDER — POLYETHYLENE GLYCOL 3350 17 G PO PACK: 17.0000 g | PACK | Freq: Every day | ORAL | 0 refills | Status: DC | PRN

## 2021-12-13 NOTE — Discharge Summary (Signed)
Discharge Summary  [name] QJF:[id number] DOB: 1931-04-04  PCP: Tonia Ghent, MD  Admit date: 12/06/2021 Discharge date: 12/13/2021  Time spent: 39mns, more than 50% time spent on coordination of care.   Recommendations for Outpatient Follow-up:  F/u with SNF MD  for hospital discharge follow up, repeat cbc/bmp at follow up F/u with vascular surgery  Dr. BTrula Sladeas scheduled  F/u with  outpatient mammogram/oncology     Discharge Diagnoses:  Active Hospital Problems   Diagnosis Date Noted   Acute exacerbation of chronic obstructive pulmonary disease (COPD) ([place] 089/37/3428  Acute metabolic encephalopathy 076/81/1572  Ground-level fall 12/06/2021   Breast cancer [place]    Chronic systolic CHF (congestive heart failure) (HCC)    Generalized weakness 07/07/2020   Chronic anemia 09/07/2019   AAA (abdominal aortic aneurysm) [place] 12/31/2018   Thoracic aortic aneurysm 04/10/2017   Anxiety 03/01/2008   Acquired hypothyroidism 12/24/2006    Resolved Hospital Problems  No resolved problems to display.    Discharge Condition: stable  Diet recommendation: heart healthy diet    Filed Weights   12/10/21 0500 12/11/21 0500 12/13/21 0500  Weight: 60.8 kg 62.2 kg 61.7 kg    History of present illness:  88year old female with history of COPD, chronic respiratory failure with hypoxia on 2 L oxygen by nasal cannula, TIAs, chronic systolic heart failure, allergic rhinitis, chronic back pain, chronic anemia presented with worsening shortness of breath, fall and some confusion.  On presentation, patient required BiPAP.  High-sensitivity troponin was 14 and 12.  CT of the head without contrast showed no acute intracranial process.  CT cervical spine showed no acute cervical fracture or evidence of subluxation or injury.  X-ray of the pelvis and right femur x-ray showed no acute fracture.  Chest x-ray showed no evidence of acute cardiopulmonary process.  CTA chest, abdomen and  pelvis showed unchanged plaque within the aortic arch, also showing 5 cm infrarenal abdominal aortic aneurysm without evidence of dissection or rupture, as well as 4.3 cm ascending thoracic aortic aneurysm and radiology recommending follow-up imaging in 6 months; enlarging right axillary lymphadenopathy was also seen with radiology recommending appropriate outpatient mammographic and oncology follow-up.  ED provider discussed case with Dr. BLissa Moralessurgery who recommended outpatient referral to vascular surgery after discharge.  Patient was admitted for copd exacerbation and started on nebs and IV Solu-Medrol.  she has improved , PT subsequently recommended SNF placement.  Hospital Course:  Principal Problem:   Acute exacerbation of chronic obstructive pulmonary disease (COPD) (HCC) Active Problems:   Acquired hypothyroidism   Anxiety   Thoracic aortic aneurysm   AAA (abdominal aortic aneurysm) (HCC)   Chronic anemia   Generalized weakness   Acute metabolic encephalopathy   Ground-level fall   Breast cancer (HCC)   Chronic systolic CHF (congestive heart failure) (HCC)   Assessment and Plan:   Acute on chronic hypoxic and hypercapnic respiratory failure/ COPD exacerbation -Normally uses 3 L oxygen via nasal cannula at home.  Required BiPAP on presentation and was hypercapnic. -Imaging findings as above.  COVID-19 testing negative on presentation.  Respiratory virus PCR negative -Respiratory status improving.  Currently on 2-3 L oxygen by nasal cannula.  Continue nebs, Zithromax and Dulera.  Solu-Medrol has been switched to oral prednisone and plan to taper off   Acute metabolic encephalopathy -Possibly from above.  Mental status improving.  Monitor mental status.  Fall precautions.  Resumed scheduled BuSpar and Cymbalta.  Xanax resumed at a lower dose  Generalized weakness Possible mechanical fall Physical deconditioning CT of the head and cervical spine did not show any  acute intracranial or cervical abnormality. -PT recommends SNF placement.     Ascending thoracic aortic aneurysm/abdominal aortic aneurysm -Imaging findings as above.  Outpatient follow-up with vascular surgery as above.  Chronic systolic heart failure -Currently compensated.  Last echo in January 2020 had shown EF 40 to 45%.  Strict input and output.  Daily weights.  Fluid restriction. outpatient cardiology follow-up. -no edema at discharge   History of CAD -continue Plavix/ statin  -f/u with cardiology Dr Tamala Julian   Dysphagia -SLP evaluation.  Diet as per SLP recommendations: SLP Diet Recommendations: Regular solids;Thin liquid   Liquid Administration via: Cup;Straw   Medication Administration: Whole meds with liquid   Supervision: Patient able to self feed   Compensations: Minimize environmental distractions;Slow rate;Small sips/bites   Postural Changes: Remain semi-upright after after feeds/meals (Comment);Seated upright at 90 degrees (remain seated upright 30-45 minutes after meals)   Oral Care Recommendations: Oral care BID;Patient independent with oral care   Cognitive impairment with possible dementia -Continue donepezil  Hypothyroidism -Continue levothyroxine  H/o Breast cancer -Continue anastrozole.   -enlarging right axillary lymphadenopathy was  seen on CTA chest,  outpatient mammographic and oncology follow-up   Generalized anxiety disorder -BuSpar and Cymbalta plan as above.  Thrombocytopenia -questionable cause.  No signs of bleeding.  Monitor intermittently  Anemia of chronic disease -Hemoglobin currently stable.  Continue oral iron supplementation.   Hypokalemia -Improved    Discharge Exam: BP (!) 152/84 (BP Location: Right Arm)   Pulse 68   Temp 98.7 F (37.1 C) (Oral)   Resp 18   Ht 5' (1.524 m)   Wt 61.7 kg   SpO2 97%   BMI 26.57 kg/m   General: NAD, pleasant  Cardiovascular: RRR, no lower extremity edema Respiratory: normal respiratory  effort     Discharge Instructions     Diet - low sodium heart healthy   Complete by: As directed    Increase activity slowly   Complete by: As directed       Allergies as of 12/13/2021       Reactions   Doxycycline Other (See Comments)   GI upset   Sulfadiazine Other (See Comments)   REACTION: Fever, aches   Sulfa Antibiotics Rash        Medication List     STOP taking these medications    oxybutynin 5 MG tablet Commonly known as: DITROPAN   traZODone 50 MG tablet Commonly known as: DESYREL       TAKE these medications    acetaminophen 500 MG tablet Commonly known as: TYLENOL Take 1,000 mg by mouth every 6 (six) hours as needed for moderate pain or headache.   albuterol 108 (90 Base) MCG/ACT inhaler Commonly known as: ProAir HFA Inhale 1-2 puffs into the lungs every 6 (six) hours as needed for wheezing or shortness of breath (Okay to fill with Ventolin or albuterol HFA).   alendronate 70 MG tablet Commonly known as: FOSAMAX Take 1 tablet (70 mg total) by mouth every Monday.   ALPRAZolam 0.25 MG tablet Commonly known as: XANAX Take 1 tablet (0.25 mg total) by mouth 3 (three) times daily as needed for anxiety or sleep. What changed:  medication strength See the new instructions.   anastrozole 1 MG tablet Commonly known as: ARIMIDEX Take 1 tablet (1 mg total) by mouth daily.   busPIRone 5 MG tablet Commonly known as: BUSPAR TAKE  ONE TABLET BY MOUTH IN THE MORNING AND IN THE EVENING   CALTRATE 600 PO Take 600 mg by mouth daily.   cholecalciferol 25 MCG (1000 UNIT) tablet Commonly known as: VITAMIN D Take 1 tablet (1,000 Units total) by mouth daily.   clopidogrel 75 MG tablet Commonly known as: PLAVIX Take 1 tablet (75 mg total) by mouth daily. Please make overdue appt with Dr. Tamala Julian before anymore refills. Thank you 3rd and Final attempt   diclofenac Sodium 1 % Gel Commonly known as: CVS Diclofenac Sodium Apply 2 g topically 4 (four) times  daily as needed.   donepezil 5 MG tablet Commonly known as: ARICEPT Take 1 tablet (5 mg total) by mouth at bedtime. What changed:  when to take this reasons to take this   DULoxetine 60 MG capsule Commonly known as: CYMBALTA TAKE ONE CAPSULE BY MOUTH DAILY   ferrous sulfate 325 (65 FE) MG tablet Commonly known as: FerrouSul Take 1 tablet (325 mg total) by mouth daily with breakfast.   fluticasone 50 MCG/ACT nasal spray Commonly known as: FLONASE PLACE 2 SPRAYS INTO BOTH NOSTRILS DAILY. What changed:  when to take this reasons to take this   ipratropium-albuterol 0.5-2.5 (3) MG/3ML Soln Commonly known as: DUONEB Take 3 mLs by nebulization 2 (two) times daily.   levothyroxine 112 MCG tablet Commonly known as: SYNTHROID TAKE ONE TABLET BY MOUTH DAILY   methocarbamol 500 MG tablet Commonly known as: ROBAXIN Take 1 tablet (500 mg total) by mouth every 6 (six) hours as needed for muscle spasms. Please contact clinic about follow up with palliative care   mometasone-formoterol 200-5 MCG/ACT Aero Commonly known as: DULERA Inhale 2 puffs into the lungs 2 (two) times daily.   multivitamin with minerals Tabs tablet Take 1 tablet by mouth daily.   nitroGLYCERIN 0.4 MG SL tablet Commonly known as: NITROSTAT Place 1 tablet (0.4 mg total) under the tongue every 5 (five) minutes as needed for chest pain.   OXYGEN Inhale 3-3.5 L into the lungs continuous.   pantoprazole 20 MG tablet Commonly known as: PROTONIX TAKE ONE TABLET BY MOUTH TWICE DAILY   polyethylene glycol 17 g packet Commonly known as: MIRALAX / GLYCOLAX Take 17 g by mouth daily as needed for moderate constipation.   predniSONE 10 MG tablet Commonly known as: DELTASONE Please take prednisone with breakfast, please take '30mg'$  on 6/8, then '20mg'$  on 6/9, then '10mg'$  on 6/10, then stop.   rosuvastatin 10 MG tablet Commonly known as: CRESTOR Take 1 tablet (10 mg total) by mouth daily.   senna-docusate 8.6-50 MG  tablet Commonly known as: Senokot-S Take 1 tablet by mouth at bedtime.   Spiriva HandiHaler 18 MCG inhalation capsule Generic drug: tiotropium INHALE THE CONTENTS OF ONE CAPSULE VIA HANDIHALER DAILY What changed: See the new instructions.       Allergies  Allergen Reactions   Doxycycline Other (See Comments)    GI upset   Sulfadiazine Other (See Comments)    REACTION: Fever, aches   Sulfa Antibiotics Rash    Contact information for after-discharge care     Destination     [facility] .   Service: Skilled Nursing Contact information: [place] [phone]                      The results of significant diagnostics from this hospitalization (including imaging, microbiology, ancillary and laboratory) are listed below for reference.    Significant  Diagnostic Studies: DG Chest 1 View  Result Date: 12/06/2021 CLINICAL DATA:  Fall last night, unwitnessed EXAM: CHEST  1 VIEW COMPARISON:  None Available. FINDINGS: Unchanged cardiomediastinal silhouette. No focal airspace disease. No large pleural effusion. No pneumothorax. There is possible nondisplaced right anterior tenth rib fracture. Left glenohumeral osteoarthritis. Prior right anatomic shoulder arthroplasty with glenoid remodeling and diminished acromial humeral space likely reflecting rotator cuff insufficiency. IMPRESSION: No acute cardiopulmonary disease. Possible right anterior tenth rib fracture, correlate with point tenderness. Electronically Signed   By: Maurine Simmering M.D.   On: 12/06/2021 17:51   DG Pelvis 1-2 Views  Result Date: 12/06/2021 CLINICAL DATA:  Fall, left pelvic and right upper leg pain EXAM: PELVIS - 1-2 VIEW COMPARISON:  None Available. FINDINGS: There is no evidence of acute fracture on single frontal view of the pelvis. There is mild bilateral hip osteoarthritis. Mild bilateral SI joint degenerative change. IMPRESSION: No evidence of  acute fracture on single frontal view of the pelvis. Electronically Signed   By: Maurine Simmering M.D.   On: 12/06/2021 17:52   CT HEAD WO CONTRAST (5MM)  Result Date: 12/06/2021 CLINICAL DATA:  Head trauma, intracranial venous injury suspected; Neck trauma (Age >= 65y) EXAM: CT HEAD WITHOUT CONTRAST CT CERVICAL SPINE WITHOUT CONTRAST TECHNIQUE: Multidetector CT imaging of the head and cervical spine was performed following the standard protocol without intravenous contrast. Multiplanar CT image reconstructions of the cervical spine were also generated. RADIATION DOSE REDUCTION: This exam was performed according to the departmental dose-optimization program which includes automated exposure control, adjustment of the mA and/or kV according to patient size and/or use of iterative reconstruction technique. COMPARISON:  CT head and cervical spine 09/25/2021 FINDINGS: CT HEAD FINDINGS Brain: No evidence of acute intracranial hemorrhage.Unchanged bifrontal hygromas.The ventricles are unchanged in size.Confluent periventricular and subcortical white matter hypoattenuation, which is nonspecific but likely sequela of chronic small vessel ischemic disease.Moderate cerebral atrophy Vascular: No hyperdense vessel. Skull: Negative for skull fracture. Sinuses/Orbits: Trace mastoid effusions. Ethmoid air cell and sphenoid sinus mucosal thickening with some frothy material. Orbits are unremarkable. Other: None. CT CERVICAL SPINE FINDINGS Alignment: Normal. Skull base and vertebrae: No acute fracture. No primary bone lesion or focal pathologic process. Soft tissues and spinal canal: No prevertebral fluid or swelling. No visible canal hematoma. Disc levels: There is mild multilevel degenerative disc disease worst at C4-C5 and C5-C6. There is mild multilevel facet arthropathy. Upper chest: Negative. Other: None. IMPRESSION: No acute intracranial abnormality. Unchanged chronic small vessel ischemic disease. No acute cervical spine  fracture. Electronically Signed   By: Maurine Simmering M.D.   On: 12/06/2021 17:25   CT Cervical Spine Wo Contrast  Result Date: 12/06/2021 CLINICAL DATA:  Head trauma, intracranial venous injury suspected; Neck trauma (Age >= 65y) EXAM: CT HEAD WITHOUT CONTRAST CT CERVICAL SPINE WITHOUT CONTRAST TECHNIQUE: Multidetector CT imaging of the head and cervical spine was performed following the standard protocol without intravenous contrast. Multiplanar CT image reconstructions of the cervical spine were also generated. RADIATION DOSE REDUCTION: This exam was performed according to the departmental dose-optimization program which includes automated exposure control, adjustment of the mA and/or kV according to patient size and/or use of iterative reconstruction technique. COMPARISON:  CT head and cervical spine 09/25/2021 FINDINGS: CT HEAD FINDINGS Brain: No evidence of acute intracranial hemorrhage.Unchanged bifrontal hygromas.The ventricles are unchanged in size.Confluent periventricular and subcortical white matter hypoattenuation, which is nonspecific but likely sequela of chronic small vessel ischemic disease.Moderate cerebral atrophy Vascular: No hyperdense vessel. Skull:  Negative for skull fracture. Sinuses/Orbits: Trace mastoid effusions. Ethmoid air cell and sphenoid sinus mucosal thickening with some frothy material. Orbits are unremarkable. Other: None. CT CERVICAL SPINE FINDINGS Alignment: Normal. Skull base and vertebrae: No acute fracture. No primary bone lesion or focal pathologic process. Soft tissues and spinal canal: No prevertebral fluid or swelling. No visible canal hematoma. Disc levels: There is mild multilevel degenerative disc disease worst at C4-C5 and C5-C6. There is mild multilevel facet arthropathy. Upper chest: Negative. Other: None. IMPRESSION: No acute intracranial abnormality. Unchanged chronic small vessel ischemic disease. No acute cervical spine fracture. Electronically Signed   By: Maurine Simmering M.D.   On: 12/06/2021 17:25   DG Swallowing Func-Speech Pathology  Result Date: 12/11/2021 Table formatting from the original result was not included. Images from the original result were not included. Objective Swallowing Evaluation: Type of Study: MBS-Modified Barium Swallow Study  Patient Details Name: Karigan Cloninger MRN: [id number] Date of Birth: 06-08-1931 Today's Date: 12/11/2021 Time: SLP Start Time (ACUTE ONLY): 6734 -SLP Stop Time (ACUTE ONLY): 1937 SLP Time Calculation (min) (ACUTE ONLY): 20 min Past Medical History: Past Medical History: Diagnosis Date  Abdominal pain 04/05/2017  Allergic rhinitis, cause unspecified   Allergy, unspecified not elsewhere classified   Anemia, unspecified 04/10/2008  Qualifier: Diagnosis of  By: Linna Darner MD, [place] state 03/01/2008  Qualifier: Diagnosis of  By: Council Mechanic MD, Hilaria Ota   Asthma   Breast cancer ([place]) 08/2018  right breast  Breast mass 08/28/2018  Chronic kidney disease   frequency  COMMON MIGRAINE 12/24/2006  Qualifier: Diagnosis of  By: Fuller Plan CMA (AAMA), [place]    COPD (chronic obstructive pulmonary disease) ([place])   Degeneration of intervertebral disc, site unspecified   Diverticulosis of colon (without mention of hemorrhage)   DNR (do not resuscitate) 09/08/2019  Dysphagia 02/18/2019  Encounter for chronic pain management 07/26/2016  Indication for chronic opioid: chronic back pain Medication and dose:  Oxycodone '10mg'$  TID # pills per month: 90 Last UDS date: 04/15/15 Pain contract signed (Y/N):  yes Date narcotic database last reviewed (include red flags):  05/06/17  Esophageal reflux   Fibromyalgia 12/24/2006  Qualifier: Diagnosis of  By: Fuller Plan CMA (AAMA), Lug[place]    Headache   HTN (hypertension) 06/09/2013  Hyperlipidemia with target LDL less than 70 12/24/2006  Qualifier: Diagnosis of  By: Fuller Plan CMA (AAMA), [place]    Hypokalemia 05/24/2009  Qualifier: Diagnosis of  By: Lacretia Nicks    Hypothyroidism 12/24/2006  Qualifier: Diagnosis  of  By: Fuller Plan CMA (AAMA), Lugene    Insomnia 12/24/2006  she had trials of antihistamines and TCAs prev w/o effect.     Insomnia, unspecified   Lower GI bleeding 09/09/2019  Myalgia and myositis, unspecified   Need for prophylactic hormone replacement therapy (postmenopausal)   Osteoarthritis of right knee 08/27/2011  R knee pain, s/p arthroscopy 2012 per [name]   Osteoporosis 06/23/2007  Qualifier: Diagnosis of  By: Council Mechanic MD, Hilaria Ota   Osteoporosis, unspecified   Other abnormal blood chemistry   Other and unspecified hyperlipidemia   Other chronic pain   Presence of drug coated stent in right coronary artery 01/30/2019  Stroke [facility])   mini strokes  Syncope 01/24/2017  Thoracic aortic aneurysm ([place]) 04/10/2017  Unspecified essential hypertension   Unspecified hypothyroidism   Vitamin D deficiency 09/26/2016 Past Surgical History: Past Surgical History: Procedure Laterality Date  BACK SURGERY    breast cystectomy    CORONARY/GRAFT ACUTE MI REVASCULARIZATION  N/A 01/29/2019  Procedure: CORONARY/GRAFT ACUTE MI REVASCULARIZATION;  Surgeon: Belva Crome, MD;  Location: [facility];  Service: Cardiovascular;  Laterality: N/A;  JOINT REPLACEMENT    shoulder right  KNEE ARTHROSCOPY    Right knee 2012  MASS EXCISION Left 04/07/2015  Procedure: MINOR EXCISION OF MASS LEFT SMALL FINGER;  Surgeon: Leanora Cover, MD;  Location: [facility];  Service: Orthopedics;  Laterality: Left;  SHOULDER SURGERY    TOTAL KNEE ARTHROPLASTY  12/11/2011  Procedure: TOTAL KNEE ARTHROPLASTY;  Surgeon: Johnny Bridge, MD;  Location: [facility];  Service: Orthopedics;  Laterality: Right;  UMBILICAL HERNIA REPAIR N/A 06/09/2013  Procedure: EXPLORATIORY LAPAROTOMY, HERNIA REPAIR UMBILICAL, INSERTION OF MESH;  Surgeon: Haywood Lasso, MD;  Location: [facility];  Service: General;  Laterality: N/A; HPI: Patient is an 87 y.o. female with PMH: chronic hypoxic respiratory failure on 3L continuous nasal cannula, severe COPD, TIA's, chronic  systolic heart failure, allergic rhinitis, chronic back pain, chronic anemia who was admitted to [facility] ED complaining of SOB. In ED she was afebrile, CT head negative for acute intracranial process, CXR showed no evidence of acute cardiopulmonary process, CT chest/abdomen/pelvisdid not show any evidence of infiltrate, edema, effusion or pneumothorax.  Patient c/o feeling that food was getting stuck in throat, prompting order for speech swallow evaluation.  Subjective: pleasant, said she is anxious about this test  Recommendations for follow up therapy are one component of a multi-disciplinary discharge planning process, led by the attending physician.  Recommendations may be updated based on patient status, additional functional criteria and insurance authorization. Assessment / Plan / Recommendation   12/11/2021   9:16 AM Clinical Impressions Clinical Impression Patient presents with a mild pharyngeal phase dysphagia and what appears to be a primary esophageal phase dysphagia. During pharyngeal phase, patient exhibited brief swallow initiation delays to level of vallecular sinus and mildly decreased pharyngeal peristalsis but with full clearance of all tested bolus consistencies (puree, regular solid, 29m barium tablet, thin liquid). During esophageal sweep, appearance of suspected esophageal dysmotility observed (no radiologist present to confirm). SLP advised patient to eat smaller, more frequent meals, stay sitting upright after meals, take her time with eating (she reports she eats quickly) and to take rest breaks secondary to COPD. Patient understandably has anxiety related to globus sensation of foods and worry that she will choke. No evidence of aspiration/penetration or barium stasis during pharyngeal phase of swallow. No further SLP intervention or assessment indicated but patient is advised to f/u with her PCP for further management of what appears to be an esophageal dysphagia.       12/11/2021   9:14 AM  Treatment Recommendations Treatment Recommendations No treatment recommended at this time     12/11/2021   9:21 AM Prognosis Prognosis for Safe Diet Advancement Good   12/11/2021   9:14 AM Diet Recommendations SLP Diet Recommendations Regular solids;Thin liquid Liquid Administration via Cup;Straw Medication Administration Whole meds with liquid Compensations Minimize environmental distractions;Slow rate;Small sips/bites Postural Changes Remain semi-upright after after feeds/meals (Comment);Seated upright at 90 degrees     12/11/2021   9:14 AM Other Recommendations Oral Care Recommendations Oral care BID;Patient independent with oral care Follow Up Recommendations No SLP follow up Assistance recommended at discharge None Functional Status Assessment Patient has had a recent decline in their functional status and demonstrates the ability to make significant improvements in function in a reasonable and predictable amount of time.   12/10/2021   4:34 PM Frequency and Duration  Speech Therapy Frequency (ACUTE ONLY) min 1 x/week Treatment Duration 1 week     12/11/2021   9:12 AM Oral Phase Oral Phase [facility]    12/11/2021   9:12 AM Pharyngeal Phase Pharyngeal Phase Impaired Pharyngeal- Thin Cup Delayed swallow initiation-vallecula Pharyngeal- Puree Delayed swallow initiation-vallecula;Reduced pharyngeal peristalsis Pharyngeal- Regular Delayed swallow initiation-vallecula;Reduced pharyngeal peristalsis Pharyngeal- Pill [facility]    12/11/2021   9:13 AM Cervical Esophageal Phase  Cervical Esophageal Phase Impaired Thin Cup [facility] Puree WFL Regular WFL Pill WFL Sonia Baller, MA, CCC-SLP Speech Therapy                     CT Angio Chest/Abd/Pel for Dissection W and/or Wo Contrast  Result Date: 12/06/2021 CLINICAL DATA:  Fall, thoracic aortic aneurysm, suspected acute aortic syndrome, history of right breast cancer EXAM: CT ANGIOGRAPHY CHEST, ABDOMEN AND PELVIS TECHNIQUE: Non-contrast CT of the chest was initially obtained. Multidetector CT  imaging through the chest, abdomen and pelvis was performed using the standard protocol during bolus administration of intravenous contrast. Multiplanar reconstructed images and MIPs were obtained and reviewed to evaluate the vascular anatomy. RADIATION DOSE REDUCTION: This exam was performed according to the departmental dose-optimization program which includes automated exposure control, adjustment of the mA and/or kV according to patient size and/or use of iterative reconstruction technique. CONTRAST:  135m OMNIPAQUE IOHEXOL 350 MG/ML SOLN COMPARISON:  12/06/2021, 09/14/2019 FINDINGS: CTA CHEST FINDINGS Cardiovascular: Ascending thoracic aortic aneurysm measures approximately 4.3 cm, not significantly changed since prior study. There is diffuse atherosclerosis of the aortic arch, with stable ulcerative atheromatous plaque within the proximal arch as before. No evidence of periaortic fat stranding to suggest leak, rupture, or inflammation. No evidence of dissection. While not optimized for opacification of the pulmonary vasculature, there is adequate opacification of the pulmonary artery. No filling defects or pulmonary emboli. The heart is unremarkable without pericardial effusion. Stable coronary artery atherosclerosis. Mediastinum/Nodes: Progressive right axillary adenopathy, with largest lymph node measuring up to 16 mm in short axis reference image 13/6, previously measuring 10 mm. This is concerning for metastatic disease in a patient with a known history of right breast cancer. No pathologic mediastinal or hilar adenopathy. Thyroid gland, trachea, and esophagus demonstrate no significant findings. Lungs/Pleura: No acute airspace disease, effusion, or pneumothorax. Central airways are patent. Musculoskeletal: The bones are osteopenic. No acute displaced fractures. Partial visualization of a right shoulder arthroplasty. Chronic anterior wedging of the T3 vertebral body with less than 25% loss of height.  Reconstructed images demonstrate no additional findings. Review of the MIP images confirms the above findings. CTA ABDOMEN AND PELVIS FINDINGS VASCULAR Aorta: Fusiform dilation of the abdominal aorta, with 5 cm infrarenal abdominal aortic aneurysm. Diffuse atherosclerosis. No evidence of dissection. No periaortic fat stranding to suggest leak or rupture. Celiac: Patent without evidence of aneurysm, dissection, vasculitis or significant stenosis. SMA: Patent without evidence of aneurysm, dissection, vasculitis or significant stenosis. Renals: Both renal arteries are patent without evidence of aneurysm, dissection, vasculitis, fibromuscular dysplasia or significant stenosis. Bilateral accessory renal arteries are incidentally noted. IMA: Patent without evidence of aneurysm, dissection, vasculitis or significant stenosis. Inflow: Patent without evidence of aneurysm, dissection, vasculitis or significant stenosis. Veins: No obvious venous abnormality within the limitations of this arterial phase study. Review of the MIP images confirms the above findings. NON-VASCULAR Hepatobiliary: No focal liver abnormality is seen. No gallstones, gallbladder wall thickening, or biliary dilatation. Pancreas: Unremarkable. No pancreatic ductal dilatation or surrounding inflammatory changes. Spleen: Normal in size without  focal abnormality. Adrenals/Urinary Tract: Adrenal glands are unremarkable. Kidneys are normal, without renal calculi, focal lesion, or hydronephrosis. Bladder is unremarkable. Stomach/Bowel: No bowel obstruction or ileus. No bowel wall thickening or inflammatory change. Lymphatic: No pathologic adenopathy within the abdomen or pelvis. Reproductive: Markedly enlarged fibroid uterus measures 16.4 x 15.4 x 14.0 cm. No adnexal masses. Other: No free fluid or free intraperitoneal gas. No abdominal wall hernia. Musculoskeletal: No acute or destructive bony lesions. Multilevel thoracolumbar spondylosis. Chronic T11  compression deformity. Reconstructed images demonstrate no additional findings. Review of the MIP images confirms the above findings. IMPRESSION: Vascular: 1. Ulcerative atheromatous plaque within the aortic arch, without significant change since prior study. No evidence of periaortic fat stranding to suggest inflammation, leak, or pending rupture. 2. 5 cm infrarenal abdominal aortic aneurysm, as well as a 4.3 cm ascending thoracic aortic aneurysm. Recommend follow-up every 6 months and vascular consultation. Reference: J Am Coll Radiol 4696;29:528-413. 3. No evidence of thoracoabdominal aortic dissection. 4. No evidence of pulmonary embolus. 5.  Aortic Atherosclerosis (ICD10-I70.0). Nonvascular: 1. Enlarging right axillary lymphadenopathy, concerning for metastatic disease in a patient with a known history of right breast cancer. Appropriate outpatient mammographic and sonographic follow-up recommended. 2. No acute intrathoracic, intra-abdominal, or intrapelvic trauma. 3. Stable enlarged fibroid uterus. Electronically Signed   By: Randa Ngo M.D.   On: 12/06/2021 19:14   DG FEMUR, MIN 2 VIEWS RIGHT  Result Date: 12/06/2021 CLINICAL DATA:  Right leg pain EXAM: RIGHT FEMUR 2 VIEWS COMPARISON:  None Available. FINDINGS: There is no evidence of acute fracture involving the right femur. There is a right total knee arthroplasty noted and normal alignment. No right knee joint effusion. There is mild right hip osteoarthritis. IMPRESSION: No evidence of acute fracture involving the right femur. Electronically Signed   By: Maurine Simmering M.D.   On: 12/06/2021 17:53    Microbiology: Recent Results (from the past 240 hour(s))  MRSA Next Gen by PCR, Nasal     Status: None   Collection Time: 12/06/21  9:03 PM   Specimen: Nasal Mucosa; Nasal Swab  Result Value Ref Range Status   MRSA by PCR Next Gen NOT DETECTED NOT DETECTED Final    Comment: (NOTE) The GeneXpert MRSA Assay (FDA approved for NASAL specimens  only), is one component of a comprehensive MRSA colonization surveillance program. It is not intended to diagnose MRSA infection nor to guide or monitor treatment for MRSA infections. Test performance is not FDA approved in patients less than 67 years old. Performed at [facility], [place] [address] [place], [place] [address]   SARS Coronavirus 2 by RT PCR (hospital order, performed in [facility] hospital lab) *cepheid single result test* Anterior Nasal Swab     Status: None   Collection Time: 12/06/21 10:00 PM   Specimen: Anterior Nasal Swab  Result Value Ref Range Status   SARS Coronavirus 2 by RT PCR NEGATIVE NEGATIVE Final    Comment: (NOTE) SARS-CoV-2 target nucleic acids are NOT DETECTED.  The SARS-CoV-2 RNA is generally detectable in upper and lower respiratory specimens during the acute phase of infection. The lowest concentration of SARS-CoV-2 viral copies this assay can detect is 250 copies / mL. A negative result does not preclude SARS-CoV-2 infection and should not be used as the sole basis for treatment or other patient management decisions.  A negative result may occur with improper specimen collection / handling, submission of specimen other than nasopharyngeal swab, presence of viral mutation(s) within the areas  targeted by this assay, and inadequate number of viral copies (<250 copies / mL). A negative result must be combined with clinical observations, patient history, and epidemiological information.  Fact Sheet for Patients:   https://www.patel.info/  Fact Sheet for Healthcare Providers: https://hall.com/  This test is not yet approved or  cleared by the [place] FDA and has been authorized for detection and/or diagnosis of SARS-CoV-2 by FDA under an Emergency Use Authorization (EUA).  This EUA will remain in effect (meaning this test can be used) for the duration of the COVID-19 declaration  under Section 564(b)(1) of the Act, 21 U.S.C. section 360bbb-3(b)(1), unless the authorization is terminated or revoked sooner.  Performed at [facility], [place] [address] [place], [place] [address]   Respiratory (~20 pathogens) panel by PCR     Status: None   Collection Time: 12/07/21 10:29 AM   Specimen: Nasopharyngeal Swab; Respiratory  Result Value Ref Range Status   Adenovirus NOT DETECTED NOT DETECTED Final   Coronavirus 229E NOT DETECTED NOT DETECTED Final    Comment: (NOTE) The Coronavirus on the Respiratory Panel, DOES NOT test for the novel  Coronavirus (2019 nCoV)    Coronavirus HKU1 NOT DETECTED NOT DETECTED Final   Coronavirus NL63 NOT DETECTED NOT DETECTED Final   Coronavirus OC43 NOT DETECTED NOT DETECTED Final   Metapneumovirus NOT DETECTED NOT DETECTED Final   Rhinovirus / Enterovirus NOT DETECTED NOT DETECTED Final   Influenza A NOT DETECTED NOT DETECTED Final   Influenza B NOT DETECTED NOT DETECTED Final   Parainfluenza Virus 1 NOT DETECTED NOT DETECTED Final   Parainfluenza Virus 2 NOT DETECTED NOT DETECTED Final   Parainfluenza Virus 3 NOT DETECTED NOT DETECTED Final   Parainfluenza Virus 4 NOT DETECTED NOT DETECTED Final   Respiratory Syncytial Virus NOT DETECTED NOT DETECTED Final   Bordetella pertussis NOT DETECTED NOT DETECTED Final   Bordetella Parapertussis NOT DETECTED NOT DETECTED Final   Chlamydophila pneumoniae NOT DETECTED NOT DETECTED Final   Mycoplasma pneumoniae NOT DETECTED NOT DETECTED Final    Comment: Performed at [facility], [place]. [address] [place],  [address]     Labs: Basic Metabolic Panel: Recent Labs  Lab 12/06/21 1651 12/06/21 1854 12/07/21 0249 12/08/21 2339 12/10/21 0323  NA 144  --  140 140 141  K 3.7  --  3.8 3.4* 3.6  CL 101  --  102 99 102  CO2 37*  --  34* 34* 34*  GLUCOSE 98  --  185* 126* 90  BUN 19  --  '17 20 19  '$ CREATININE 0.53  --  0.51 0.65 0.57  CALCIUM 9.2  --  8.6* 8.7*  8.9  MG  --  2.4 2.0 2.3 2.2  PHOS  --   --  3.0  --   --    Liver Function Tests: Recent Labs  Lab 12/06/21 1651 12/07/21 0249  AST 22 22  ALT 11 12  ALKPHOS 36* 36*  BILITOT 1.0 0.6  PROT 6.7 6.6  ALBUMIN 3.5 3.3*   No results for input(s): LIPASE, AMYLASE in the last 168 hours. No results for input(s): AMMONIA in the last 168 hours. CBC: Recent Labs  Lab 12/06/21 1651 12/07/21 0249 12/10/21 0323  WBC 4.7 4.0 4.5  NEUTROABS 3.3 3.7 2.8  HGB 10.0* 10.5* 9.9*  HCT 32.8* 34.4* 31.7*  MCV 105.5* 103.9* 102.3*  PLT 131* 126* 147*   Cardiac Enzymes: Recent Labs  Lab 12/06/21 1651  CKTOTAL 17*  BNP: BNP (last 3 results) Recent Labs    12/06/21 1651  BNP 110.6*    ProBNP (last 3 results) No results for input(s): PROBNP in the last 8760 hours.  CBG: No results for input(s): GLUCAP in the last 168 hours.  FURTHER DISCHARGE INSTRUCTIONS:   Get Medicines reviewed and adjusted: Please take all your medications with you for your next visit with your Primary MD   Laboratory/radiological data: Please request your Primary MD to go over all hospital tests and procedure/radiological results at the follow up, please ask your Primary MD to get all Hospital records sent to his/her office.   In some cases, they will be blood work, cultures and biopsy results pending at the time of your discharge. Please request that your primary care M.D. goes through all the records of your hospital data and follows up on these results.   Also Note the following: If you experience worsening of your admission symptoms, develop shortness of breath, life threatening emergency, suicidal or homicidal thoughts you must seek medical attention immediately by calling 911 or calling your MD immediately  if symptoms less severe.   You must read complete instructions/literature along with all the possible adverse reactions/side effects for all the Medicines you take and that have been prescribed to  you. Take any new Medicines after you have completely understood and accpet all the possible adverse reactions/side effects.    Do not drive when taking Pain medications or sleeping medications (Benzodaizepines)   Do not take more than prescribed Pain, Sleep and Anxiety Medications. It is not advisable to combine anxiety,sleep and pain medications without talking with your primary care practitioner   Special Instructions: If you have smoked or chewed Tobacco  in the last 2 yrs please stop smoking, stop any regular Alcohol  and or any Recreational drug use.   Wear Seat belts while driving.   Please note: You were cared for by a hospitalist during your hospital stay. Once you are discharged, your primary care physician will handle any further medical issues. Please note that NO REFILLS for any discharge medications will be authorized once you are discharged, as it is imperative that you return to your primary care physician (or establish a relationship with a primary care physician if you do not have one) for your post hospital discharge needs so that they can reassess your need for medications and monitor your lab values.     Signed:  Florencia Reasons MD, PhD, FACP  [facility] 12/13/2021, 1:04 PM

## 2021-12-13 NOTE — Plan of Care (Signed)
  Problem: Education: Goal: Knowledge of General Education information will improve Description Including pain rating scale, medication(s)/side effects and non-pharmacologic comfort measures Outcome: Progressing   

## 2021-12-13 NOTE — Progress Notes (Signed)
Physical Therapy Treatment Patient Details Name: Janvi Ammar MRN: [id number] DOB: 07-03-1931 Today's Date: 12/13/2021   History of Present Illness 88 year old female with history of COPD, chronic respiratory failure with hypoxia on 2 L oxygen by nasal cannula, TIAs, chronic systolic heart failure, allergic rhinitis, chronic back pain, chronic anemia presented with worsening shortness of breath, fall and some confusion. Patient admitted with exacerbation of COPD.    PT Comments    General Comments: AxO x 3 very pleasant Lady.  Assisted OOB to amb in hallway went well.  General bed mobility comments: self able with for safety and O2 line management.  General transfer comment: pt self able to rise with good use of hands to steady self. General Gait Details: pt tolerated amb a functional distance in hallway on 2 lts with avg sats 95% and HR 86 with mild c/o dyspnea.  Pt lives home alone.  Pt would benefit from ST Rehab to address her decline in functional Indep.   Recommendations for follow up therapy are one component of a multi-disciplinary discharge planning process, led by the attending physician.  Recommendations may be updated based on patient status, additional functional criteria and insurance authorization.  Follow Up Recommendations  Skilled nursing-short term rehab (<3 hours/day)     Assistance Recommended at Discharge Frequent or constant Supervision/Assistance  Patient can return home with the following A little help with walking and/or transfers;A little help with bathing/dressing/bathroom;Assistance with cooking/housework;Assist for transportation;Help with stairs or ramp for entrance   Equipment Recommendations  None recommended by PT    Recommendations for Other Services       Precautions / Restrictions Precautions Precaution Comments: Chronic O2 Restrictions Weight Bearing Restrictions: No     Mobility  Bed Mobility Overal bed mobility: Needs Assistance Bed  Mobility: Supine to Sit, Sit to Supine     Supine to sit: Supervision Sit to supine: Supervision   General bed mobility comments: self able with for safety and O2 line management    Transfers Overall transfer level: Needs assistance Equipment used: Rolling walker (2 wheels) Transfers: Sit to/from Stand Sit to Stand: Supervision, Min guard           General transfer comment: pt self able to rise with good use of hands to steady self.    Ambulation/Gait Ambulation/Gait assistance: Supervision, Min guard Gait Distance (Feet): 75 Feet Assistive device: Rolling walker (2 wheels) Gait Pattern/deviations: Step-through pattern, Decreased stride length, Drifts right/left, Narrow base of support Gait velocity: decreased     General Gait Details: pt tolerated amb a functional distance in hallway on 2 lts with avg sats 95% and HR 86 with mild c/o dyspnea.   Stairs             Wheelchair Mobility    Modified Rankin (Stroke Patients Only)       Balance                                            Cognition Arousal/Alertness: Awake/alert Behavior During Therapy: WFL for tasks assessed/performed Overall Cognitive Status: Within Functional Limits for tasks assessed                                 General Comments: AxO x 3 very pleasant Lady.        Exercises  General Comments        Pertinent Vitals/Pain Pain Assessment Pain Assessment: No/denies pain    Home Living                          Prior Function            PT Goals (current goals can now be found in the care plan section) Progress towards PT goals: Progressing toward goals    Frequency    Min 3X/week      PT Plan Current plan remains appropriate    Co-evaluation              AM-PAC PT "6 Clicks" Mobility   Outcome Measure  Help needed turning from your back to your side while in a flat bed without using bedrails?: None Help  needed moving from lying on your back to sitting on the side of a flat bed without using bedrails?: None Help needed moving to and from a bed to a chair (including a wheelchair)?: A Little Help needed standing up from a chair using your arms (e.g., wheelchair or bedside chair)?: A Little Help needed to walk in hospital room?: A Little Help needed climbing 3-5 steps with a railing? : A Little 6 Click Score: 20    End of Session Equipment Utilized During Treatment: Gait belt;Oxygen Activity Tolerance: Patient tolerated treatment well Patient left: in bed;with call bell/phone within reach;with bed alarm set;with family/visitor present Nurse Communication: Mobility status PT Visit Diagnosis: Unsteadiness on feet (R26.81);History of falling (Z91.81);Difficulty in walking, not elsewhere classified (R26.2);Dizziness and giddiness (R42)     Time: 3300-7622 PT Time Calculation (min) (ACUTE ONLY): 16 min  Charges:  $Gait Training: 8-22 mins                     Rica Koyanagi  PTA Acute  Rehabilitation Services Pager      [phone] Office      [phone]

## 2021-12-13 NOTE — TOC Transition Note (Signed)
Transition of Care [facility]) - CM/SW Discharge Note   Patient Details  Name: Irean Kendricks MRN: [id number] Date of Birth: [birth year]  [facility]) CM/SW Contact:  Lennart Pall, LCSW Phone Number: 12/13/2021, 1:31 PM   Clinical Narrative:    Have received insurance auth for SNF today and pt medically cleared for dc to [facility].  Pt and daughter aware and agreeable.  PTAR called at 1:25pm. RN to call report to [phone].  No further TOC needs.     Barriers to Discharge: Barriers Resolved   Patient Goals and CMS Choice        Discharge Placement   Existing PASRR number confirmed : 12/09/21          Patient chooses bed at: [facility] Patient to be transferred to facility by: [place] Name of family member notified: daughter, [name] Patient and family notified of of transfer: 12/13/21  Discharge Plan and Services                DME Arranged: N/A DME Agency: NA                  Social Determinants of Health ([place]) Interventions     Readmission Risk Interventions    12/13/2021    1:29 PM  Readmission Risk Prevention Plan  PCP or Specialist appointment within 3-5 days of discharge Complete  HRI or Home Care Consult Complete  [place] Complete

## 2021-12-13 NOTE — Progress Notes (Signed)
Occupational Therapy Treatment Patient Details Name: [name] MRN: [id number] DOB: 1931/04/11 Today's Date: 12/13/2021   History of present illness 88 year old female with history of COPD, chronic respiratory failure with hypoxia on 2 L oxygen by nasal cannula, TIAs, chronic systolic heart failure, allergic rhinitis, chronic back pain, chronic anemia presented with worsening shortness of breath, fall and some confusion. Patient admitted with exacerbation of COPD.   OT comments  Patient was noted to report increased dizziness with transitions on this date. Patients BP seated in recliner was 163/98 mmhg. Standing  blood pressure was 137/79 mmhg. Nurse made aware. Patient reported her BP has been dropping since she has been in the hospital. Patient's discharge plan remains appropriate at this time. OT will continue to follow acutely.     Recommendations for follow up therapy are one component of a multi-disciplinary discharge planning process, led by the attending physician.  Recommendations may be updated based on patient status, additional functional criteria and insurance authorization.    Follow Up Recommendations  Skilled nursing-short term rehab (<3 hours/day) (V.s. HH with 24/7 cargiver support)    Assistance Recommended at Discharge Frequent or constant Supervision/Assistance  Patient can return home with the following  A little help with walking and/or transfers;A little help with bathing/dressing/bathroom;Assistance with cooking/housework;Help with stairs or ramp for entrance   Equipment Recommendations  None recommended by OT    Recommendations for Other Services      Precautions / Restrictions Precautions Precaution Comments: monitor O2 and BP Restrictions Weight Bearing Restrictions: No       Mobility Bed Mobility Overal bed mobility: Needs Assistance       Supine to sit: Supervision Sit to supine: Supervision   General bed mobility comments: for safety  and O2 line management    Transfers                         Balance Overall balance assessment: Needs assistance Sitting-balance support: No upper extremity supported, Feet supported Sitting balance-Leahy Scale: Good     Standing balance support: No upper extremity supported, During functional activity, Reliant on assistive device for balance Standing balance-Leahy Scale: Fair Standing balance comment: reliant on UE support (at least unilateral) for dynamic tasks                           ADL either performed or assessed with clinical judgement   ADL Overall ADL's : Needs assistance/impaired                         Toilet Transfer: Min guard;Rolling walker (2 wheels);Grab bars Toilet Transfer Details (indicate cue type and reason): with education on keeping O2 cord from being tangled. patient noted to pick walker up off the ground to untangle x2 with education on keeping RW on the ground. Toileting- Water quality scientist and Hygiene: Min guard;Sit to/from stand Toileting - Clothing Manipulation Details (indicate cue type and reason): with need for one UE support during tasks to maintain balance with increased time.            Extremity/Trunk Assessment              Vision       Perception     Praxis      Cognition Arousal/Alertness: Awake/alert Behavior During Therapy: WFL for tasks assessed/performed Overall Cognitive Status: Within Functional Limits for tasks assessed  General Comments: Pt admits to memory deficits, [facility]        Exercises      Shoulder Instructions       General Comments      Pertinent Vitals/ Pain       Pain Assessment Pain Assessment: No/denies pain  Home Living                                          Prior Functioning/Environment              Frequency  Min 2X/week        Progress Toward Goals  OT Goals(current goals  can now be found in the care plan section)  Progress towards OT goals: Progressing toward goals     Plan Discharge plan remains appropriate    Co-evaluation                 AM-PAC OT "6 Clicks" Daily Activity     Outcome Measure   Help from another person eating meals?: None Help from another person taking care of personal grooming?: A Little Help from another person toileting, which includes using toliet, bedpan, or urinal?: A Little Help from another person bathing (including washing, rinsing, drying)?: A Little Help from another person to put on and taking off regular upper body clothing?: A Little Help from another person to put on and taking off regular lower body clothing?: A Little 6 Click Score: 19    End of Session Equipment Utilized During Treatment: Rolling walker (2 wheels);Oxygen  OT Visit Diagnosis: Muscle weakness (generalized) (M62.81)   Activity Tolerance Patient tolerated treatment well   Patient Left in bed;with call bell/phone within reach;with bed alarm set   Nurse Communication Mobility status        Time: 4193-7902 OT Time Calculation (min): 21 min  Charges: OT General Charges $OT Visit: 1 Visit OT Treatments $Self Care/Home Management : 8-22 mins  Jackelyn Poling OTR/L, MS Acute Rehabilitation Department Office# [phone] Pager# [phone]   Marcellina Millin 12/13/2021, 11:37 AM

## 2021-12-13 NOTE — Progress Notes (Signed)
Just attempted to call Jennette to give report on patient. No answer. Will try again later.

## 2021-12-13 NOTE — Care Management Important Message (Signed)
Important Message  Patient Details IM Letter placed in Patients room. Name: [name] MRN: [id number] Date of Birth: [birth year]   Medicare Important Message Given:  Yes     Kerin Salen 12/13/2021, 2:40 PM

## 2021-12-13 NOTE — Progress Notes (Signed)
The patient is alert and oriented and has been seen by her physician. The orders for discharge were written. IV has been removed. Went over discharge instructions with patient and family. She is being discharged to SNF via [facility] with all of her belongings.

## 2021-12-16 DIAGNOSIS — I714 Abdominal aortic aneurysm, without rupture, unspecified: Secondary | ICD-10-CM | POA: Diagnosis not present

## 2021-12-16 DIAGNOSIS — R5381 Other malaise: Secondary | ICD-10-CM | POA: Diagnosis not present

## 2021-12-16 DIAGNOSIS — E039 Hypothyroidism, unspecified: Secondary | ICD-10-CM | POA: Diagnosis not present

## 2021-12-16 DIAGNOSIS — C50919 Malignant neoplasm of unspecified site of unspecified female breast: Secondary | ICD-10-CM | POA: Diagnosis not present

## 2021-12-16 DIAGNOSIS — J441 Chronic obstructive pulmonary disease with (acute) exacerbation: Secondary | ICD-10-CM | POA: Diagnosis not present

## 2021-12-16 DIAGNOSIS — F0390 Unspecified dementia without behavioral disturbance: Secondary | ICD-10-CM | POA: Diagnosis not present

## 2021-12-16 DIAGNOSIS — R269 Unspecified abnormalities of gait and mobility: Secondary | ICD-10-CM | POA: Diagnosis not present

## 2021-12-16 DIAGNOSIS — J9611 Chronic respiratory failure with hypoxia: Secondary | ICD-10-CM | POA: Diagnosis not present

## 2021-12-16 DIAGNOSIS — I2510 Atherosclerotic heart disease of native coronary artery without angina pectoris: Secondary | ICD-10-CM | POA: Diagnosis not present

## 2021-12-18 ENCOUNTER — Telehealth: Payer: Self-pay

## 2021-12-18 DIAGNOSIS — F5101 Primary insomnia: Secondary | ICD-10-CM | POA: Diagnosis not present

## 2021-12-18 DIAGNOSIS — M797 Fibromyalgia: Secondary | ICD-10-CM | POA: Diagnosis not present

## 2021-12-18 DIAGNOSIS — F03A18 Unspecified dementia, mild, with other behavioral disturbance: Secondary | ICD-10-CM | POA: Diagnosis not present

## 2021-12-18 DIAGNOSIS — M1990 Unspecified osteoarthritis, unspecified site: Secondary | ICD-10-CM | POA: Diagnosis not present

## 2021-12-18 DIAGNOSIS — F330 Major depressive disorder, recurrent, mild: Secondary | ICD-10-CM | POA: Diagnosis not present

## 2021-12-18 DIAGNOSIS — J449 Chronic obstructive pulmonary disease, unspecified: Secondary | ICD-10-CM | POA: Diagnosis not present

## 2021-12-18 DIAGNOSIS — Z9981 Dependence on supplemental oxygen: Secondary | ICD-10-CM | POA: Diagnosis not present

## 2021-12-18 DIAGNOSIS — R5381 Other malaise: Secondary | ICD-10-CM | POA: Diagnosis not present

## 2021-12-18 DIAGNOSIS — F411 Generalized anxiety disorder: Secondary | ICD-10-CM | POA: Diagnosis not present

## 2021-12-18 DIAGNOSIS — Z9181 History of falling: Secondary | ICD-10-CM | POA: Diagnosis not present

## 2021-12-18 DIAGNOSIS — M6281 Muscle weakness (generalized): Secondary | ICD-10-CM | POA: Diagnosis not present

## 2021-12-18 DIAGNOSIS — R41841 Cognitive communication deficit: Secondary | ICD-10-CM | POA: Diagnosis not present

## 2021-12-18 DIAGNOSIS — G8929 Other chronic pain: Secondary | ICD-10-CM | POA: Diagnosis not present

## 2021-12-18 NOTE — Telephone Encounter (Signed)
Does she had a doc attached to the rehab facility seeing the patient?  If so it needs to come through them so we don't complicate her tx plan.

## 2021-12-18 NOTE — Telephone Encounter (Signed)
Patient's daughter is calling in stating that [name] is having bad back pain. [name] said that the Rehab center is not giving her anything other than tylenol which isnt helping. Daughter is asking if there is anything that can be prescribed, [name] is unable to come for an appointment as she is currently in rehab.

## 2021-12-18 NOTE — Telephone Encounter (Signed)
Spoke with [name] and she states she is unsure if she has seen a doctor there or not but will find out and ask for them to prescribe her something.

## 2021-12-19 DIAGNOSIS — M6281 Muscle weakness (generalized): Secondary | ICD-10-CM | POA: Diagnosis not present

## 2021-12-19 DIAGNOSIS — J449 Chronic obstructive pulmonary disease, unspecified: Secondary | ICD-10-CM | POA: Diagnosis not present

## 2021-12-19 DIAGNOSIS — Z9981 Dependence on supplemental oxygen: Secondary | ICD-10-CM | POA: Diagnosis not present

## 2021-12-19 DIAGNOSIS — S22000A Wedge compression fracture of unspecified thoracic vertebra, initial encounter for closed fracture: Secondary | ICD-10-CM | POA: Diagnosis not present

## 2021-12-19 DIAGNOSIS — Z9181 History of falling: Secondary | ICD-10-CM | POA: Diagnosis not present

## 2021-12-19 DIAGNOSIS — M797 Fibromyalgia: Secondary | ICD-10-CM | POA: Diagnosis not present

## 2021-12-19 DIAGNOSIS — F1320 Sedative, hypnotic or anxiolytic dependence, uncomplicated: Secondary | ICD-10-CM | POA: Diagnosis not present

## 2021-12-19 DIAGNOSIS — M1990 Unspecified osteoarthritis, unspecified site: Secondary | ICD-10-CM | POA: Diagnosis not present

## 2021-12-19 DIAGNOSIS — R41841 Cognitive communication deficit: Secondary | ICD-10-CM | POA: Diagnosis not present

## 2021-12-19 DIAGNOSIS — G8929 Other chronic pain: Secondary | ICD-10-CM | POA: Diagnosis not present

## 2021-12-19 DIAGNOSIS — R5381 Other malaise: Secondary | ICD-10-CM | POA: Diagnosis not present

## 2021-12-19 NOTE — Telephone Encounter (Signed)
[name] wanted to let us know that her mom is being readmitted to [facility]  She has compressed fractures in her back and is in a lot pain, needs more pain meds  She is also supposed to see an Spine ortho doctor while she is there

## 2021-12-20 NOTE — Telephone Encounter (Signed)
Noted. Will await inpatient notes.

## 2021-12-21 ENCOUNTER — Emergency Department (HOSPITAL_COMMUNITY): Payer: Medicare PPO

## 2021-12-21 ENCOUNTER — Emergency Department (HOSPITAL_COMMUNITY)
Admission: EM | Admit: 2021-12-21 | Discharge: 2021-12-22 | Disposition: A | Discharge status: Skilled Nursing Facility | Payer: Medicare PPO | Attending: Emergency Medicine | Admitting: Emergency Medicine

## 2021-12-21 ENCOUNTER — Encounter (HOSPITAL_COMMUNITY): Payer: Self-pay

## 2021-12-21 ENCOUNTER — Other Ambulatory Visit: Payer: Self-pay

## 2021-12-21 DIAGNOSIS — I110 Hypertensive heart disease with heart failure: Secondary | ICD-10-CM | POA: Insufficient documentation

## 2021-12-21 DIAGNOSIS — M549 Dorsalgia, unspecified: Secondary | ICD-10-CM | POA: Insufficient documentation

## 2021-12-21 DIAGNOSIS — Z79899 Other long term (current) drug therapy: Secondary | ICD-10-CM | POA: Diagnosis not present

## 2021-12-21 DIAGNOSIS — I509 Heart failure, unspecified: Secondary | ICD-10-CM | POA: Diagnosis not present

## 2021-12-21 DIAGNOSIS — J449 Chronic obstructive pulmonary disease, unspecified: Secondary | ICD-10-CM | POA: Diagnosis not present

## 2021-12-21 DIAGNOSIS — E039 Hypothyroidism, unspecified: Secondary | ICD-10-CM | POA: Insufficient documentation

## 2021-12-21 DIAGNOSIS — Z853 Personal history of malignant neoplasm of breast: Secondary | ICD-10-CM | POA: Diagnosis not present

## 2021-12-21 DIAGNOSIS — S22000A Wedge compression fracture of unspecified thoracic vertebra, initial encounter for closed fracture: Secondary | ICD-10-CM

## 2021-12-21 DIAGNOSIS — M546 Pain in thoracic spine: Secondary | ICD-10-CM | POA: Diagnosis not present

## 2021-12-21 DIAGNOSIS — M545 Low back pain, unspecified: Secondary | ICD-10-CM | POA: Diagnosis not present

## 2021-12-21 MED ORDER — FENTANYL CITRATE PF 50 MCG/ML IJ SOSY
50.0000 ug | PREFILLED_SYRINGE | Freq: Once | INTRAMUSCULAR | Status: AC
  Administered 2021-12-21: 50 ug via INTRAVENOUS
  Filled 2021-12-21: qty 1

## 2021-12-21 MED ORDER — LIDOCAINE 5 % EX PTCH
1.0000 | MEDICATED_PATCH | CUTANEOUS | Status: DC
  Administered 2021-12-21: 1 via TRANSDERMAL
  Filled 2021-12-21: qty 1

## 2021-12-21 MED ORDER — ONDANSETRON HCL 4 MG/2ML IJ SOLN
4.0000 mg | Freq: Once | INTRAMUSCULAR | Status: AC
  Administered 2021-12-21: 4 mg via INTRAVENOUS
  Filled 2021-12-21: qty 2

## 2021-12-21 MED ORDER — HYDROMORPHONE HCL 1 MG/ML IJ SOLN
1.0000 mg | INTRAMUSCULAR | Status: DC | PRN
  Administered 2021-12-21: 1 mg via INTRAVENOUS
  Filled 2021-12-21: qty 1

## 2021-12-21 NOTE — ED Provider Notes (Signed)
[facility] DEPT Provider Note   CSN: [id number] Arrival date & time: 12/21/21  1730  History  Chief Complaint  Patient presents with   Back Pain   [name] is a 88 y.o. female.  88 year old female presents from SNF via EMS for continued severe back pain after fall 3 weeks ago.  PMH includes degenerative disc disease, thoracic aortic aneurysm (4.3 cm), infrarenal AAA (5 cm), CHF, COPD, CVA, STEMI, breast cancer, HTN, HLD, hypothyroidism, fibromyalgia, osteoporosis. Daughter over phone reports SNF performed x-rays last week that showed worsening compression fractures and that the SNF was not able to get her into an orthopedist or provide adequate pain relief. CTA chest/abdomen/pelvis on 5/31 at [facility] long ED demonstrated 5 cm infrarenal AAA, ascending thoracic aortic aneurysm 4.3 cm, chronic anterior wedging of T3 vertebral body, chronic T11 compression deformity, markedly enlarged fibroid uterus, and enlarging right axillary lymphadenopathy.   Home Medications Prior to Admission medications   Medication Sig Start Date End Date Taking? Authorizing Provider  acetaminophen (TYLENOL) 500 MG tablet Take 1,000 mg by mouth every 6 (six) hours as needed for moderate pain or headache.    [provider]  albuterol (PROAIR HFA) 108 (90 Base) MCG/ACT inhaler Inhale 1-2 puffs into the lungs every 6 (six) hours as needed for wheezing or shortness of breath (Okay to fill with Ventolin or albuterol HFA). 10/30/19   Tonia Ghent, MD  alendronate (FOSAMAX) 70 MG tablet Take 1 tablet (70 mg total) by mouth every Monday. 10/02/21   Benay Pike, MD  ALPRAZolam Duanne Moron) 0.25 MG tablet Take 1 tablet (0.25 mg total) by mouth 3 (three) times daily as needed for anxiety or sleep. 12/11/21   Aline August, MD  anastrozole (ARIMIDEX) 1 MG tablet Take 1 tablet (1 mg total) by mouth daily. 10/05/20   Magrinat, Virgie Dad, MD  busPIRone (BUSPAR) 5 MG tablet TAKE ONE  TABLET BY MOUTH IN THE MORNING AND IN THE EVENING 10/23/21   Tonia Ghent, MD  Calcium Carbonate (CALTRATE 600 PO) Take 600 mg by mouth daily.    [provider]  cholecalciferol (VITAMIN D) 1000 units tablet Take 1 tablet (1,000 Units total) by mouth daily. 09/25/16   Tonia Ghent, MD  donepezil (ARICEPT) 5 MG tablet Take 1 tablet (5 mg total) by mouth at bedtime. Patient taking differently: Take 5 mg by mouth at bedtime as needed (memory). 11/28/21   Tonia Ghent, MD  DULoxetine (CYMBALTA) 60 MG capsule TAKE ONE CAPSULE BY MOUTH DAILY 04/18/21   Tonia Ghent, MD  ferrous sulfate (FERROUSUL) 325 (65 FE) MG tablet Take 1 tablet (325 mg total) by mouth daily with breakfast. 08/22/18   Tonia Ghent, MD  fluticasone (FLONASE) 50 MCG/ACT nasal spray PLACE 2 SPRAYS INTO BOTH NOSTRILS DAILY. Patient taking differently: Place 2 sprays into both nostrils daily as needed for allergies. 01/28/20   Tonia Ghent, MD  ipratropium-albuterol (DUONEB) 0.5-2.5 (3) MG/3ML SOLN Take 3 mLs by nebulization 2 (two) times daily. 12/13/21   Florencia Reasons, MD  levothyroxine (SYNTHROID) 112 MCG tablet TAKE ONE TABLET BY MOUTH DAILY 11/08/21   Tonia Ghent, MD  methocarbamol (ROBAXIN) 500 MG tablet Take 1 tablet (500 mg total) by mouth every 6 (six) hours as needed for muscle spasms. Please contact clinic about follow up with palliative care 05/24/21   Tonia Ghent, MD  mometasone-formoterol [facility]) 200-5 MCG/ACT AERO Inhale 2 puffs into the lungs 2 (two) times daily. 12/13/21  Florencia Reasons, MD  Multiple Vitamin (MULTIVITAMIN WITH MINERALS) TABS tablet Take 1 tablet by mouth daily.    [provider]  nitroGLYCERIN (NITROSTAT) 0.4 MG SL tablet Place 1 tablet (0.4 mg total) under the tongue every 5 (five) minutes as needed for chest pain. 02/23/20   Belva Crome, MD  OXYGEN Inhale 3-3.5 L into the lungs continuous.    [provider]  pantoprazole (PROTONIX) 20 MG tablet TAKE ONE TABLET  BY MOUTH TWICE DAILY 10/23/21   Tonia Ghent, MD  polyethylene glycol (MIRALAX / GLYCOLAX) 17 g packet Take 17 g by mouth daily as needed for moderate constipation. 12/13/21   Florencia Reasons, MD  predniSONE (DELTASONE) 10 MG tablet Please take prednisone with breakfast, please take '30mg'$  on 6/8, then '20mg'$  on 6/9, then '10mg'$  on 6/10, then stop. 12/13/21   Florencia Reasons, MD  rosuvastatin (CRESTOR) 10 MG tablet Take 1 tablet (10 mg total) by mouth daily. 10/23/21   Tonia Ghent, MD  senna-docusate (SENOKOT-S) 8.6-50 MG tablet Take 1 tablet by mouth at bedtime. 12/13/21   Florencia Reasons, MD  SPIRIVA HANDIHALER 18 MCG inhalation capsule INHALE THE CONTENTS OF ONE CAPSULE VIA HANDIHALER DAILY Patient taking differently: 18 mcg daily as needed (sob). 10/23/21   Tonia Ghent, MD  bisoprolol (ZEBETA) 5 MG tablet Take 0.5 tablets (2.5 mg total) by mouth daily. 01/31/19 09/18/19  Belva Crome, MD     Allergies    Doxycycline, Sulfadiazine, and Sulfa antibiotics    Review of Systems   Review of Systems  Constitutional:  Negative for activity change, appetite change, chills, diaphoresis and fatigue.  Musculoskeletal:  Positive for back pain. Negative for joint swelling, myalgias, neck pain and neck stiffness.  Neurological:  Negative for dizziness, weakness, light-headedness and headaches.  Hematological:  Negative for adenopathy. Does not bruise/bleed easily.    Physical Exam Updated Vital Signs BP (!) 162/66   Pulse 84   Temp 98.3 F (36.8 C)   Resp 19   Ht 5' (1.524 m)   Wt 55.3 kg   SpO2 100%   BMI 23.83 kg/m  Physical Exam Vitals and nursing note reviewed.  Constitutional:      General: She is not in acute distress.    Appearance: Normal appearance. She is obese. She is not ill-appearing, toxic-appearing or diaphoretic.  HENT:     Head: Normocephalic and atraumatic.     Nose: Nose normal.     Mouth/Throat:     Mouth: Mucous membranes are moist.  Eyes:     Extraocular Movements: Extraocular  movements intact.     Conjunctiva/sclera: Conjunctivae normal.  Cardiovascular:     Rate and Rhythm: Normal rate and regular rhythm.     Pulses: Normal pulses.  Pulmonary:     Effort: Pulmonary effort is normal.     Breath sounds: Normal breath sounds.  Abdominal:     General: Abdomen is flat.  Skin:    General: Skin is warm.     Capillary Refill: Capillary refill takes less than 2 seconds.  Neurological:     General: No focal deficit present.     Mental Status: She is alert.     Cranial Nerves: No cranial nerve deficit.     Sensory: No sensory deficit.     Motor: No weakness.     Comments: Able to wiggle toes bilaterally and raise both legs  Psychiatric:        Mood and Affect: Mood normal.  Behavior: Behavior normal.     ED Results / Procedures / Treatments   Labs (all labs ordered are listed, but only abnormal results are displayed) Labs Reviewed - No data to display  EKG None  Radiology No results found.  Procedures Procedures   Medications Ordered in ED Medications  lidocaine (LIDODERM) 5 % 1 patch (has no administration in time range)  fentaNYL (SUBLIMAZE) injection 50 mcg (has no administration in time range)   ED Course/ Medical Decision Making/ A&P                           Medical Decision Making 88 year old female with continued back pain s/p fall 3 weeks ago.  Differential includes worsening infrarenal AAA (including enlargement or leak), occult fractures or worsening compression fractures, continued MSK pain in light of insufficient pain control, and possible malignant bony metastases. Patient provided fentanyl IV and lidocaine patch to right ribs with moderate relief.  IV Dilaudid provided the best relief.  Thoracic and lumbar x-rays demonstrated new T8 compression fracture with 75% compression and anterior height.  Discussed with neurosurgeon Dr. Dory Larsen, who recommends TLSO brace and follow-up outpatient in about a week.  Not a good surgical  candidate.  Patient stable and discharged to SNF.  Amount and/or Complexity of Data Reviewed Independent Historian:     Details: Two daughters at bedside. External Data Reviewed: radiology. Radiology: ordered.  Risk Prescription drug management.  Final Clinical Impression(s) / ED Diagnoses Final diagnoses:  None   Rx / DC Orders ED Discharge Orders     None      Ezequiel Essex, MD   Ezequiel Essex, MD 12/21/21 2259    Lucrezia Starch, MD 12/22/21 [phone]

## 2021-12-21 NOTE — ED Triage Notes (Signed)
Pt coming from [facility]  via EMS with c/o back pain. Pt states that her back pain has been getting worse since her fall a couple weeks ago.

## 2021-12-21 NOTE — ED Notes (Signed)
Ortho called for TLSO brace at this time. Family and pt updated at this time.

## 2021-12-21 NOTE — Progress Notes (Signed)
Orthopedic Tech Progress Note Patient Details:  [name] July 04, 1931 [id number]  Patient ID: [name], female   DOB: 11-Apr-1931, 88 y.o.   MRN: [id number]  [name] 12/21/2021, 8:50 PM Tl[facility] ordered from [facility]

## 2021-12-21 NOTE — Discharge Instructions (Addendum)
Keep on the TLSO brace as instructed.  Follow-up with neurosurgery Dr. Glenford Peers within about a week, see clinic information below.   Continue Percocet at your rehab facility.  Keep your follow-up appointment with the vascular surgeon for your aneurysms.   Follow-up Information     Marvis Moeller, NP. Schedule an appointment as soon as possible for a visit in 1 week.   Specialty: Neurosurgery Contact information: [address] [place] [address] [phone]         Go to  Tonia Ghent, MD.   Specialty: Family Medicine Why: As needed Contact information: [place] [address] [phone]         Go to  [facility] DEPT.   Specialty: Emergency Medicine Why: As needed Contact information: [place] [id number] [place] [phone]

## 2021-12-22 ENCOUNTER — Other Ambulatory Visit: Payer: Self-pay | Admitting: Family Medicine

## 2021-12-22 DIAGNOSIS — M797 Fibromyalgia: Secondary | ICD-10-CM | POA: Diagnosis not present

## 2021-12-22 DIAGNOSIS — F02A4 Dementia in other diseases classified elsewhere, mild, with anxiety: Secondary | ICD-10-CM | POA: Diagnosis not present

## 2021-12-22 DIAGNOSIS — M810 Age-related osteoporosis without current pathological fracture: Secondary | ICD-10-CM | POA: Diagnosis not present

## 2021-12-22 DIAGNOSIS — S22000D Wedge compression fracture of unspecified thoracic vertebra, subsequent encounter for fracture with routine healing: Secondary | ICD-10-CM | POA: Diagnosis not present

## 2021-12-22 DIAGNOSIS — J9610 Chronic respiratory failure, unspecified whether with hypoxia or hypercapnia: Secondary | ICD-10-CM | POA: Diagnosis not present

## 2021-12-22 DIAGNOSIS — Z9181 History of falling: Secondary | ICD-10-CM | POA: Diagnosis not present

## 2021-12-22 DIAGNOSIS — M1990 Unspecified osteoarthritis, unspecified site: Secondary | ICD-10-CM | POA: Diagnosis not present

## 2021-12-22 DIAGNOSIS — R41841 Cognitive communication deficit: Secondary | ICD-10-CM | POA: Diagnosis not present

## 2021-12-22 DIAGNOSIS — M5134 Other intervertebral disc degeneration, thoracic region: Secondary | ICD-10-CM | POA: Diagnosis not present

## 2021-12-22 DIAGNOSIS — M6281 Muscle weakness (generalized): Secondary | ICD-10-CM | POA: Diagnosis not present

## 2021-12-22 DIAGNOSIS — R5381 Other malaise: Secondary | ICD-10-CM | POA: Diagnosis not present

## 2021-12-22 DIAGNOSIS — G8929 Other chronic pain: Secondary | ICD-10-CM | POA: Diagnosis not present

## 2021-12-22 DIAGNOSIS — Z9981 Dependence on supplemental oxygen: Secondary | ICD-10-CM | POA: Diagnosis not present

## 2021-12-22 DIAGNOSIS — J449 Chronic obstructive pulmonary disease, unspecified: Secondary | ICD-10-CM | POA: Diagnosis not present

## 2021-12-25 ENCOUNTER — Ambulatory Visit: Payer: Medicare PPO | Admitting: Surgery

## 2021-12-26 DIAGNOSIS — J441 Chronic obstructive pulmonary disease with (acute) exacerbation: Secondary | ICD-10-CM | POA: Diagnosis not present

## 2021-12-26 DIAGNOSIS — G8929 Other chronic pain: Secondary | ICD-10-CM | POA: Diagnosis not present

## 2021-12-26 DIAGNOSIS — S22000A Wedge compression fracture of unspecified thoracic vertebra, initial encounter for closed fracture: Secondary | ICD-10-CM | POA: Diagnosis not present

## 2021-12-26 DIAGNOSIS — J9611 Chronic respiratory failure with hypoxia: Secondary | ICD-10-CM | POA: Diagnosis not present

## 2021-12-27 DIAGNOSIS — G8929 Other chronic pain: Secondary | ICD-10-CM | POA: Diagnosis not present

## 2021-12-27 DIAGNOSIS — S22000A Wedge compression fracture of unspecified thoracic vertebra, initial encounter for closed fracture: Secondary | ICD-10-CM | POA: Diagnosis not present

## 2021-12-27 DIAGNOSIS — J9611 Chronic respiratory failure with hypoxia: Secondary | ICD-10-CM | POA: Diagnosis not present

## 2021-12-28 ENCOUNTER — Ambulatory Visit (INDEPENDENT_AMBULATORY_CARE_PROVIDER_SITE_OTHER): Payer: Medicare PPO | Admitting: Specialist

## 2021-12-28 ENCOUNTER — Encounter: Payer: Self-pay | Admitting: Specialist

## 2021-12-28 ENCOUNTER — Telehealth: Payer: Self-pay | Admitting: Specialist

## 2021-12-28 VITALS — BP 121/75 | HR 106 | Ht 60.0 in | Wt 122.0 lb

## 2021-12-28 DIAGNOSIS — R0781 Pleurodynia: Secondary | ICD-10-CM

## 2021-12-28 DIAGNOSIS — S22000A Wedge compression fracture of unspecified thoracic vertebra, initial encounter for closed fracture: Secondary | ICD-10-CM | POA: Diagnosis not present

## 2021-12-28 DIAGNOSIS — I714 Abdominal aortic aneurysm, without rupture, unspecified: Secondary | ICD-10-CM

## 2021-12-28 NOTE — Telephone Encounter (Signed)
Calling concerning the visit this AM --wants someone to call to give her details on what is going on

## 2021-12-28 NOTE — Progress Notes (Addendum)
Office Visit Note   Patient: [name]           Date of Birth: 01-04-1931           MRN: [id number] Visit Date: 12/28/2021              Requested by: Joaquim Nam, MD [address] [place],  [place] [address] PCP: Joaquim Nam, MD   Assessment & Plan: Visit Diagnoses:  1. Compression fracture of thoracic vertebra, closed, initial encounter (HCC)   2. Rib pain on left side   3. Abdominal aortic aneurysm (AAA) 3.0 cm to 5.0 cm in diameter in female [facility])     Plan: Brace is not indicated but I recommend that she avoid bending and stooping and lifting anything over 5 lbs. Recommend a repeat CT scan as she appears to have worsened the collapse of T8 and to assess for any burst component or  Retropulsion of bone into the spinal canal. If there is no retropulsion of bone then a kyphoplasty/vertebroplasty is recommended to  Stabilize the fracture and hopefully relieve some of the pain. A repeat bone density and vitamin D level are recommended but will be done Later when she is more comfortable . Increase hydrocodone to 10mg /32 every 4-6 hours for pain.   Follow-Up Instructions: Return in about 2 weeks (around 01/11/2022).   Orders:  Orders Placed This Encounter  Procedures   CT THORACIC SPINE WO CONTRAST   Ambulatory referral to Vascular Surgery   Ambulatory referral to Interventional Radiology   No orders of the defined types were placed in this encounter.     Procedures: No procedures performed   Clinical Data: Findings:  Narrative & Impression CLINICAL DATA:  Worsening back pain since fall a few weeks ago.   EXAM: THORACIC SPINE 2 VIEWS   COMPARISON:  Reformats from chest abdomen pelvis CT 11/26/2021   FINDINGS: The bones are diffusely under mineralized. Moderate scoliosis. Moderate to advanced T8 compression fracture with greater than 75% loss of height anteriorly, new from prior exam. There is a chronic mild T12 compression  fracture. The upper thoracic spine is not well demonstrated due to osseous and soft tissue overlap, including the known anterior wedging of T3 vertebra.   IMPRESSION: 1. Moderate to advanced T8 compression fracture with greater than 75% loss of height anteriorly, new from 12/06/2021 chest CT. 2. Chronic mild T12 compression fracture. 3. Osteoporosis/osteoporosis and scoliosis. 4. Upper thoracic spine is not well assessed due to osseous and soft tissue overlap.     Electronically Signed   By: Narda Rutherford M.D.   On: 12/21/2021 19:58       Subjective: Chief Complaint  Patient presents with   Middle Back - Fracture, Pain    88 year old female with history of multiple falls x 3 over the past 4 months. She has been hospitalized at [facility] in later part of May for acute  Respiratory illness and while there had evaluation of back pain and was found to have a T8 compression fracture. The fracture is visible on a  Thoracic CT scan with contrast as being a 10-15 % wedge compression fracture. She is still having pain complaints and a neurosurgical consult  Indicated that she was not a surgical consult. Her family has requested a consultation as she is still quite painful and is now a resident at [facility]. She relates that the medication she is receiving does not seem  to relieve the pain and she is hurting constantly. Presently receiving hydrocodone Intermittant. She uses a depends pads at home and is using these at the nursing home. No numbness or paresthesias but is currently using a wheel chair.    Review of Systems  Musculoskeletal:  Positive for back pain.  Neurological:  Negative for dizziness, tremors, seizures, syncope, facial asymmetry, speech difficulty, weakness, light-headedness, numbness and headaches.     Objective: Vital Signs: BP 121/75 (BP Location: Left Arm, Patient Position: Sitting, Cuff Size: Normal)   Pulse (!) 106   Ht 5' (1.524 m)   Wt 122 lb (55.3 kg)    BMI 23.83 kg/m   Physical Exam Constitutional:      General: She is in acute distress.     Appearance: Normal appearance. She is normal weight. She is not ill-appearing.  HENT:     Head: Atraumatic.     Right Ear: Tympanic membrane normal.     Left Ear: Tympanic membrane normal.     Nose: Nose normal. No congestion or rhinorrhea.     Mouth/Throat:     Pharynx: No posterior oropharyngeal erythema.  Eyes:     General:        Right eye: No discharge.        Left eye: No discharge.     Extraocular Movements: Extraocular movements intact.  Pulmonary:     Breath sounds: Normal breath sounds.  Chest:     Chest wall: No tenderness.  Abdominal:     Palpations: Abdomen is soft.  Musculoskeletal:     Lumbar back: Negative right straight leg raise test and negative left straight leg raise test.     Back Exam   Tenderness  The patient is experiencing tenderness in the thoracic.  Range of Motion  Extension:  abnormal  Flexion:  abnormal  Lateral bend right:  abnormal  Lateral bend left:  abnormal  Rotation right:  abnormal  Rotation left:  abnormal   Muscle Strength  Right Quadriceps:  5/5  Left Quadriceps:  5/5  Right Hamstrings:  5/5  Left Hamstrings:  5/5   Tests  Straight leg raise right: negative Straight leg raise left: negative  Reflexes  Patellar:  1/4 Achilles:  1/4  Comments:  Gibbus at T8  Pain over the left rib area.  She is in constant motion with hand at left lateral chest wall complaining of severe pain.  Motor in both legs is normal Reflexes are normal in both legs, no hyperreflexia or clonus.       Specialty Comments:  No specialty comments available.  Imaging: No results found.   PMFS History: Patient Active Problem List   Diagnosis Date Noted   Acute exacerbation of chronic obstructive pulmonary disease (COPD) (HCC) 12/06/2021   Acute metabolic encephalopathy 12/06/2021   Ground-level fall 12/06/2021   Breast cancer (HCC)     Chronic systolic CHF (congestive heart failure) (HCC)    Syncope 09/25/2021   Generalized weakness 07/07/2020   Decreased appetite 09/20/2019   Lower GI bleeding 09/09/2019   DNR (do not resuscitate) 09/08/2019   Chronic anemia 09/07/2019   Macrocytic anemia 09/07/2019   Jerking 09/07/2019   Fall at home, initial encounter 09/07/2019   History of CVA (cerebrovascular accident) 09/07/2019   History of breast cancer 09/07/2019   Pelvic pain 03/25/2019   Dysphagia 02/18/2019   Coronary artery disease involving native coronary artery of native heart with unstable angina pectoris (HCC) 01/30/2019   Presence of drug coated stent  in right coronary artery 01/30/2019   Left main coronary artery disease - ~50-60% dLM (&70% dLAD) -- Plan Med Rx 01/30/2019   Ischemic cardiomyopathy 01/30/2019   Acute ST elevation myocardial infarction (STEMI) of inferior wall (HCC) 01/29/2019   AAA (abdominal aortic aneurysm) (HCC) 12/31/2018   Malignant neoplasm of upper-outer quadrant of right breast in female, estrogen receptor positive (HCC) 09/26/2018   Breast mass 08/28/2018   Bilateral impacted cerumen 08/14/2017   Laryngopharyngeal reflux (LPR) 08/14/2017   Presbycusis of both ears 08/14/2017   Thoracic aortic aneurysm 04/10/2017   Abdominal pain 04/05/2017   Low back pain 01/01/2017   Medicare annual wellness visit, subsequent 09/26/2016   Hard of hearing 09/26/2016   Vitamin D deficiency 09/26/2016   Advance care planning 09/26/2016   Encounter for chronic pain management 07/26/2016   Edema 10/21/2014   Multinodular thyroid 07/07/2013   COPD (chronic obstructive pulmonary disease) (HCC) 06/09/2013   HTN (hypertension) 06/09/2013   Chronic respiratory failure with hypoxia (HCC) 05/13/2013   Cough 05/02/2013   Vertigo 12/31/2012   Acute blood loss anemia 12/13/2011   Osteoarthritis of right knee 08/27/2011   Gold C Copd with asthmatic bronchitis component and chronic resp failure 06/26/2011    Neck pain 04/02/2011   RISK OF FALLING 08/17/2010   Hypokalemia 05/24/2009   Anemia, unspecified 04/10/2008   Anxiety 03/01/2008   Osteoporosis 06/23/2007   Chronic pain syndrome 12/26/2006   Acquired hypothyroidism 12/24/2006   Hyperlipidemia with target LDL less than 70 12/24/2006   COMMON MIGRAINE 12/24/2006   GERD 12/24/2006   DEGENERATIVE DISC DISEASE 12/24/2006   Fibromyalgia 12/24/2006   Insomnia 12/24/2006   HYPERGLYCEMIA 12/24/2006   Past Medical History:  Diagnosis Date   Abdominal pain 04/05/2017   Allergic rhinitis, cause unspecified    Allergy, unspecified not elsewhere classified    Anemia, unspecified 04/10/2008   Qualifier: Diagnosis of  By: Alwyn Ren MD, William     Anxiety    Anxiety state 03/01/2008   Qualifier: Diagnosis of  By: Hetty Ely MD, Franne Grip    Asthma    Breast cancer (HCC) 08/2018   right breast   Breast mass 08/28/2018   Chronic kidney disease    frequency   COMMON MIGRAINE 12/24/2006   Qualifier: Diagnosis of  By: Etheleen Mayhew CMA (AAMA), Lugene     COPD (chronic obstructive pulmonary disease) (HCC)    Degeneration of intervertebral disc, site unspecified    Diverticulosis of colon (without mention of hemorrhage)    DNR (do not resuscitate) 09/08/2019   Dysphagia 02/18/2019   Encounter for chronic pain management 07/26/2016   Indication for chronic opioid: chronic back pain Medication and dose:  Oxycodone 10mg  TID # pills per month: 90 Last UDS date: 04/15/15 Pain contract signed (Y/N):  yes Date narcotic database last reviewed (include red flags):  05/06/17   Esophageal reflux    Fibromyalgia 12/24/2006   Qualifier: Diagnosis of  By: Etheleen Mayhew CMA (AAMA), Lugene     Headache    HTN (hypertension) 06/09/2013   Hyperlipidemia with target LDL less than 70 12/24/2006   Qualifier: Diagnosis of  By: Etheleen Mayhew CMA (AAMA), Lugene     Hypokalemia 05/24/2009   Qualifier: Diagnosis of  By: Melody Comas     Hypothyroidism 12/24/2006   Qualifier: Diagnosis of  By:  Etheleen Mayhew CMA (AAMA), Lugene     Insomnia 12/24/2006   she had trials of antihistamines and TCAs prev w/o effect.      Insomnia, unspecified    Lower GI  bleeding 09/09/2019   Myalgia and myositis, unspecified    Need for prophylactic hormone replacement therapy (postmenopausal)    Osteoarthritis of right knee 08/27/2011   R knee pain, s/p arthroscopy 2012 per [name]    Osteoporosis 06/23/2007   Qualifier: Diagnosis of  By: Hetty Ely MD, Franne Grip    Osteoporosis, unspecified    Other abnormal blood chemistry    Other and unspecified hyperlipidemia    Other chronic pain    Presence of drug coated stent in right coronary artery 01/30/2019   Stroke [facility])    mini strokes   Syncope 01/24/2017   Thoracic aortic aneurysm (HCC) 04/10/2017   Unspecified essential hypertension    Unspecified hypothyroidism    Vitamin D deficiency 09/26/2016    Family History  Problem Relation Age of Onset   Lung cancer Brother        x2   Hypertension Father    Heart disease Father    Heart disease Mother    Asthma Sister     Past Surgical History:  Procedure Laterality Date   BACK SURGERY     breast cystectomy     CORONARY/GRAFT ACUTE MI REVASCULARIZATION N/A 01/29/2019   Procedure: CORONARY/GRAFT ACUTE MI REVASCULARIZATION;  Surgeon: Lyn Records, MD;  Location: [facility]AS[facility];  Service: Cardiovascular;  Laterality: N/A;   JOINT REPLACEMENT     shoulder right   KNEE ARTHROSCOPY     Right knee 2012   MASS EXCISION Left 04/07/2015   Procedure: MINOR EXCISION OF MASS LEFT SMALL FINGER;  Surgeon: Betha Loa, MD;  Location: [facility];  Service: Orthopedics;  Laterality: Left;   SHOULDER SURGERY     TOTAL KNEE ARTHROPLASTY  12/11/2011   Procedure: TOTAL KNEE ARTHROPLASTY;  Surgeon: Eulas Post, MD;  Location: [facility] OR;  Service: Orthopedics;  Laterality: Right;   UMBILICAL HERNIA REPAIR N/A 06/09/2013   Procedure: EXPLORATIORY LAPAROTOMY, HERNIA REPAIR UMBILICAL, INSERTION OF MESH;   Surgeon: Currie Paris, MD;  Location: [facility] OR;  Service: General;  Laterality: N/A;   Social History   Occupational History   Occupation: Housewife  Tobacco Use   Smoking status: Former    Packs/day: 1.50    Years: 10.00    Total pack years: 15.00    Types: Cigarettes    Quit date: 07/09/1985    Years since quitting: 36.4   Smokeless tobacco: Never   Tobacco comments:    1 1/2 ppd x 20 years  Substance and Sexual Activity   Alcohol use: No    Alcohol/week: 0.0 standard drinks of alcohol   Drug use: No   Sexual activity: Never

## 2021-12-28 NOTE — Patient Instructions (Addendum)
Avoid frequent bending and stooping  No lifting greater than 10 lbs. May use ice or moist heat for pain. Weight loss is of benefit. Obtain a repeat CT scan of the thorax to assess the fracture as it has collapsed more than was seen on the 5/31 scan, if there is no bone pressing on the spinal cord then a cementing of the fractured vertebrae may be Done to relieve her pain.  Hydrocodone 7.5 to 10 mg every 4-6 hours for pain.  Vitamin D level  Exercise is important to improve your indurance and does allow people to function better inspite of back pain.

## 2021-12-28 NOTE — Telephone Encounter (Signed)
Pt had an appt today and pt' s daughter is waiting for pt's prescription that was not sent in yet. Please send hydrocodone to [facility]. Phone number is [phone].

## 2021-12-29 ENCOUNTER — Encounter: Payer: Self-pay | Admitting: Specialist

## 2021-12-29 ENCOUNTER — Telehealth: Payer: Self-pay

## 2021-12-29 ENCOUNTER — Telehealth: Payer: Self-pay | Admitting: Interventional Cardiology

## 2021-12-29 DIAGNOSIS — R0789 Other chest pain: Secondary | ICD-10-CM | POA: Diagnosis not present

## 2021-12-29 DIAGNOSIS — S22000A Wedge compression fracture of unspecified thoracic vertebra, initial encounter for closed fracture: Secondary | ICD-10-CM | POA: Diagnosis not present

## 2021-12-29 DIAGNOSIS — M810 Age-related osteoporosis without current pathological fracture: Secondary | ICD-10-CM | POA: Diagnosis not present

## 2021-12-29 DIAGNOSIS — J449 Chronic obstructive pulmonary disease, unspecified: Secondary | ICD-10-CM | POA: Diagnosis not present

## 2021-12-29 MED ORDER — HYDROCODONE-ACETAMINOPHEN 10-325 MG PO TABS: 1.0000 | ORAL_TABLET | ORAL | 0 refills | Status: DC | PRN

## 2021-12-29 NOTE — Telephone Encounter (Signed)
I called and spoke with [name], advised that it appears that the compression fx has collapsed more and Dr. Otelia Sergeant wants her to get a new Ctscan to look at it, referral made to have vertobplasty and to vascular to aorta.

## 2021-12-29 NOTE — Telephone Encounter (Signed)
Faxed rx to [facility]

## 2022-01-01 DIAGNOSIS — F32A Depression, unspecified: Secondary | ICD-10-CM | POA: Diagnosis not present

## 2022-01-01 DIAGNOSIS — M810 Age-related osteoporosis without current pathological fracture: Secondary | ICD-10-CM | POA: Diagnosis not present

## 2022-01-01 DIAGNOSIS — C50919 Malignant neoplasm of unspecified site of unspecified female breast: Secondary | ICD-10-CM | POA: Diagnosis not present

## 2022-01-01 DIAGNOSIS — F03A18 Unspecified dementia, mild, with other behavioral disturbance: Secondary | ICD-10-CM | POA: Diagnosis not present

## 2022-01-01 DIAGNOSIS — F330 Major depressive disorder, recurrent, mild: Secondary | ICD-10-CM | POA: Diagnosis not present

## 2022-01-01 DIAGNOSIS — Z9981 Dependence on supplemental oxygen: Secondary | ICD-10-CM | POA: Diagnosis not present

## 2022-01-01 DIAGNOSIS — R41841 Cognitive communication deficit: Secondary | ICD-10-CM | POA: Diagnosis not present

## 2022-01-01 DIAGNOSIS — F411 Generalized anxiety disorder: Secondary | ICD-10-CM | POA: Diagnosis not present

## 2022-01-01 DIAGNOSIS — S22069A Unspecified fracture of T7-T8 vertebra, initial encounter for closed fracture: Secondary | ICD-10-CM | POA: Diagnosis not present

## 2022-01-01 DIAGNOSIS — G8929 Other chronic pain: Secondary | ICD-10-CM | POA: Diagnosis not present

## 2022-01-01 DIAGNOSIS — Z9181 History of falling: Secondary | ICD-10-CM | POA: Diagnosis not present

## 2022-01-01 DIAGNOSIS — M1990 Unspecified osteoarthritis, unspecified site: Secondary | ICD-10-CM | POA: Diagnosis not present

## 2022-01-01 DIAGNOSIS — M797 Fibromyalgia: Secondary | ICD-10-CM | POA: Diagnosis not present

## 2022-01-01 DIAGNOSIS — E039 Hypothyroidism, unspecified: Secondary | ICD-10-CM | POA: Diagnosis not present

## 2022-01-01 DIAGNOSIS — R5381 Other malaise: Secondary | ICD-10-CM | POA: Diagnosis not present

## 2022-01-01 DIAGNOSIS — I714 Abdominal aortic aneurysm, without rupture, unspecified: Secondary | ICD-10-CM | POA: Diagnosis not present

## 2022-01-01 DIAGNOSIS — M6281 Muscle weakness (generalized): Secondary | ICD-10-CM | POA: Diagnosis not present

## 2022-01-01 DIAGNOSIS — F5101 Primary insomnia: Secondary | ICD-10-CM | POA: Diagnosis not present

## 2022-01-01 DIAGNOSIS — J449 Chronic obstructive pulmonary disease, unspecified: Secondary | ICD-10-CM | POA: Diagnosis not present

## 2022-01-01 NOTE — Progress Notes (Deleted)
Cardiology Office Note    Date:  01/01/2022   ID:  [name], [name] 20-Mar-1931, MRN [id number]   PCP:  Tonia Ghent, MD   [place]  Cardiologist:  Sinclair Grooms, MD   Advanced Practice Provider:  No care team member to display Electrophysiologist:  None   [phone]   No chief complaint on file.   History of Present Illness:  [name] is a 88 y.o. female with a hx of  CAD status post acute STEMI 01/29/19 with DES RCA with 50-60% LM,70% dLAD, 70% OM1  managed medically dure to co-morbidities, ICM EF 40-45% echo 3570, chronic systolic heart failure, breast cancer treated with anastrozole and lumpectomy, O2 requiring COPD, hyperlipidemia, and essential hypertension.   Patient last had a telemedicine visit with Dr. Tamala Julian 02/03/20 and was doing well without angina.  Patient discharged 12/13/21 after being treated for COPD exacerbation and fall. On CTA scan he was found to  5 cm infrarenal abdominal aortic aneurysm, as well as a 4.3 cm ascending thoracic aortic aneurysm.Recommend follow-up every 6 months and vascular consultation.Ulcerative atheromatous plaque within the aortic arch, without significant change since prior study.   Back to the ED 12/21/21 Also found to have compression fracture of thoracic vertebra. She is on my schedule for cardiac clearance for kyphoplasty.  Past Medical History:  Diagnosis Date   Abdominal pain 04/05/2017   Allergic rhinitis, cause unspecified    Allergy, unspecified not elsewhere classified    Anemia, unspecified 04/10/2008   Qualifier: Diagnosis of  By: Linna Darner MD, William     Anxiety    Anxiety state 03/01/2008   Qualifier: Diagnosis of  By: Council Mechanic MD, Hilaria Ota    Asthma    Breast cancer ([place]) 08/2018   right breast   Breast mass 08/28/2018   Chronic kidney disease    frequency   COMMON MIGRAINE 12/24/2006   Qualifier: Diagnosis of  By: Fuller Plan CMA (AAMA), [place]     COPD (chronic  obstructive pulmonary disease) ([place])    Degeneration of intervertebral disc, site unspecified    Diverticulosis of colon (without mention of hemorrhage)    DNR (do not resuscitate) 09/08/2019   Dysphagia 02/18/2019   Encounter for chronic pain management 07/26/2016   Indication for chronic opioid: chronic back pain Medication and dose:  Oxycodone '10mg'$  TID # pills per month: 90 Last UDS date: 04/15/15 Pain contract signed (Y/N):  yes Date narcotic database last reviewed (include red flags):  05/06/17   Esophageal reflux    Fibromyalgia 12/24/2006   Qualifier: Diagnosis of  By: Fuller Plan CMA (AAMA), Lugene     Headache    HTN (hypertension) 06/09/2013   Hyperlipidemia with target LDL less than 70 12/24/2006   Qualifier: Diagnosis of  By: Fuller Plan CMA (AAMA), Lugene     Hypokalemia 05/24/2009   Qualifier: Diagnosis of  By: Lacretia Nicks     Hypothyroidism 12/24/2006   Qualifier: Diagnosis of  By: Fuller Plan CMA (AAMA), Lugene     Insomnia 12/24/2006   she had trials of antihistamines and TCAs prev w/o effect.      Insomnia, unspecified    Lower GI bleeding 09/09/2019   Myalgia and myositis, unspecified    Need for prophylactic hormone replacement therapy (postmenopausal)    Osteoarthritis of right knee 08/27/2011   R knee pain, s/p arthroscopy 2012 per [name]    Osteoporosis 06/23/2007   Qualifier: Diagnosis of  By: Council Mechanic MD, Hilaria Ota  Osteoporosis, unspecified    Other abnormal blood chemistry    Other and unspecified hyperlipidemia    Other chronic pain    Presence of drug coated stent in right coronary artery 01/30/2019   Stroke [facility])    mini strokes   Syncope 01/24/2017   Thoracic aortic aneurysm ([place]) 04/10/2017   Unspecified essential hypertension    Unspecified hypothyroidism    Vitamin D deficiency 09/26/2016    Past Surgical History:  Procedure Laterality Date   BACK SURGERY     breast cystectomy     CORONARY/GRAFT ACUTE MI REVASCULARIZATION N/A 01/29/2019   Procedure:  CORONARY/GRAFT ACUTE MI REVASCULARIZATION;  Surgeon: Belva Crome, MD;  Location: [facility];  Service: Cardiovascular;  Laterality: N/A;   JOINT REPLACEMENT     shoulder right   KNEE ARTHROSCOPY     Right knee 2012   MASS EXCISION Left 04/07/2015   Procedure: MINOR EXCISION OF MASS LEFT SMALL FINGER;  Surgeon: Leanora Cover, MD;  Location: [facility];  Service: Orthopedics;  Laterality: Left;   SHOULDER SURGERY     TOTAL KNEE ARTHROPLASTY  12/11/2011   Procedure: TOTAL KNEE ARTHROPLASTY;  Surgeon: Johnny Bridge, MD;  Location: [facility];  Service: Orthopedics;  Laterality: Right;   UMBILICAL HERNIA REPAIR N/A 06/09/2013   Procedure: EXPLORATIORY LAPAROTOMY, HERNIA REPAIR UMBILICAL, INSERTION OF MESH;  Surgeon: Haywood Lasso, MD;  Location: [facility];  Service: General;  Laterality: N/A;    Current Medications: No outpatient medications have been marked as taking for the 01/03/22 encounter (Appointment) with Imogene Burn, PA-C.     Allergies:   Doxycycline, Sulfadiazine, and Sulfa antibiotics   Social History   Socioeconomic History   Marital status: Married    Spouse name: Not on file   Number of children: Not on file   Years of education: Not on file   Highest education level: Not on file  Occupational History   Occupation: Housewife  Tobacco Use   Smoking status: Former    Packs/day: 1.50    Years: 10.00    Total pack years: 15.00    Types: Cigarettes    Quit date: 07/09/1985    Years since quitting: 36.5   Smokeless tobacco: Never   Tobacco comments:    1 1/2 ppd x 20 years  Substance and Sexual Activity   Alcohol use: No    Alcohol/week: 0.0 standard drinks of alcohol   Drug use: No   Sexual activity: Never  Other Topics Concern   Not on file  Social History Narrative   Widowed 2019 after 46 years of marriage.     Social Determinants of Health   Financial Resource Strain: High Risk (04/11/2021)   Overall Financial Resource Strain (CARDIA)     Difficulty of Paying Living Expenses: Hard  Food Insecurity: No Food Insecurity (05/17/2021)   Hunger Vital Sign    Worried About Running Out of Food in the Last Year: Never true    Ran Out of Food in the Last Year: Never true  Transportation Needs: Not on file  Physical Activity: Not on file  Stress: Not on file  Social Connections: Moderately Isolated (05/17/2021)   Social Connection and Isolation Panel [NHANES]    Frequency of Communication with Friends and Family: More than three times a week    Frequency of Social Gatherings with Friends and Family: Once a week    Attends Religious Services: 1 to 4 times per year    Active  Member of Clubs or Organizations: No    Attends Archivist Meetings: Never    Marital Status: Widowed     Family History:  The patient's ***family history includes Asthma in her sister; Heart disease in her father and mother; Hypertension in her father; Lung cancer in her brother.   ROS:   Please see the history of present illness.    ROS All other systems reviewed and are negative.   PHYSICAL EXAM:   VS:  There were no vitals taken for this visit.  Physical Exam  GEN: Well nourished, well developed, in no acute distress  HEENT: normal  Neck: no JVD, carotid bruits, or masses Cardiac:RRR; no murmurs, rubs, or gallops  Respiratory:  clear to auscultation bilaterally, normal work of breathing GI: soft, nontender, nondistended, + BS Ext: without cyanosis, clubbing, or edema, Good distal pulses bilaterally MS: no deformity or atrophy  Skin: warm and dry, no rash Neuro:  Alert and Oriented x 3, Strength and sensation are intact Psych: euthymic mood, full affect  Wt Readings from Last 3 Encounters:  12/28/21 122 lb (55.3 kg)  12/21/21 122 lb (55.3 kg)  12/13/21 136 lb 0.4 oz (61.7 kg)      Studies/Labs Reviewed:   EKG:  EKG is*** ordered today.  The ekg ordered today demonstrates ***  Recent Labs: 12/06/2021: B Natriuretic Peptide  110.6; TSH 0.067 12/07/2021: ALT 12 12/10/2021: BUN 19; Creatinine, Ser 0.57; Hemoglobin 9.9; Magnesium 2.2; Platelets 147; Potassium 3.6; Sodium 141   Lipid Panel    Component Value Date/Time   CHOL 125 11/02/2021 1557   CHOL 124 03/19/2019 1105   TRIG 65.0 11/02/2021 1557   HDL 52.00 11/02/2021 1557   HDL 47 03/19/2019 1105   CHOLHDL 2 11/02/2021 1557   VLDL 13.0 11/02/2021 1557   LDLCALC 60 11/02/2021 1557   LDLCALC 62 03/19/2019 1105   LDLDIRECT 157.9 03/12/2007 1103    Additional studies/ records that were reviewed today include:   CTA 11/2021 Fusiform dilation of the abdominal aorta, with 5 cm infrarenal abdominal aortic aneurysm. Diffuse atherosclerosis. No evidence of dissection  Cath 01/29/2019 Acute inferior ST elevation myocardial infarction with onset of symptoms 6:30 AM Thrombotic occlusion of the mid to distal RCA treated with a 22 x 3.5 mm Onyx.  TIMI grade 0 flow improved to TIMI grade III flow with 0% stenosis noted. 60% distal left main 50% mid LAD and 70% distal LAD. 70% first obtuse marginal. Left ventricle was entered with a wire but because of significant tortuosity in the patient's axillary and subclavian artery we were unable to advance the catheter into the left ventricle.   RECOMMENDATIONS:   Aspirin and Brilinta for 12 months.  If bleeding issues could switch to Plavix.  Would be helpful to continue Brilinta as long as possible before switching. Aggressive risk factor modification 2D Doppler echocardiogram to assess LV function Cardioprotective therapy with ARB and beta-blocker therapy as tolerated by heart rate and blood pressure.  Given relatively low blood pressures upon leaving the Cath Lab, this therapy will be instituted by a team member. Overall plan for left main disease is conservative medical management given age and other medical problems.  This can be debated.`     Echo 01/29/2019 IMPRESSIONS      1. Moderate hypokinesis of the left  ventricular, basal-mid inferior wall and inferolateral wall.  2. The left ventricle has mild-moderately reduced systolic function, with an ejection fraction of 40-45%. The cavity size was normal. Left ventricular  diastolic Doppler parameters are consistent with impaired relaxation. Elevated mean left atrial  pressure.  3. The right ventricle has normal systolic function. The cavity was normal. There is no increase in right ventricular wall thickness.  4. The mitral valve is degenerative. There is moderate mitral annular calcification present.  5. Mild thickening of the aortic valve. Sclerosis without any evidence of stenosis of the aortic valve. Aortic valve regurgitation is mild by color flow Doppler.  6. The aorta is abnormal in size and structure.  7. There is mild dilatation of the ascending aorta. _____________       Risk Assessment/Calculations:   {Does this patient have ATRIAL FIBRILLATION?:[phone]}     ASSESSMENT:    1. Preoperative clearance   2. Coronary artery disease involving native coronary artery of native heart without angina pectoris   3. Ischemic cardiomyopathy   4. Abdominal aortic aneurysm (AAA) without rupture, unspecified part ([place])   5. Essential hypertension   6. Other hyperlipidemia   7. Chronic obstructive pulmonary disease, unspecified COPD type ([place])      PLAN:  In order of problems listed above:  Preop clearance for Kyophoplasty by Dr. Louanne Skye.  CAD status post acute STEMI 01/29/19 with DES RCA with 50-60% LM,70% dLAD, 70% OM1  managed medically dure to co-morbidities,  ICM EF 40-45% on echo 2020  5 cm infrarenal abdominal aortic aneurysm. Diffuse atherosclerosis. No evidence of dissection on CTA 12/2021 to follow up with vascular  HTN  HLD  COPD on home O2 with recent hospitalization for exacerbation    Shared Decision Making/Informed Consent   {Are you ordering a CV Procedure (e.g. stress test, cath, DCCV, TEE, etc)?   Press F2         :[id number]}    Medication Adjustments/Labs and Tests Ordered: Current medicines are reviewed at length with the patient today.  Concerns regarding medicines are outlined above.  Medication changes, Labs and Tests ordered today are listed in the Patient Instructions below. There are no Patient Instructions on file for this visit.   Sumner Boast, PA-C  01/01/2022 10:39 AM    [facility] [address], [place], [place]  [address] Phone: [phone]; Fax: [phone]

## 2022-01-03 ENCOUNTER — Ambulatory Visit: Payer: Medicare PPO | Admitting: Physician Assistant

## 2022-01-03 DIAGNOSIS — I714 Abdominal aortic aneurysm, without rupture, unspecified: Secondary | ICD-10-CM

## 2022-01-03 DIAGNOSIS — I255 Ischemic cardiomyopathy: Secondary | ICD-10-CM

## 2022-01-03 DIAGNOSIS — I2510 Atherosclerotic heart disease of native coronary artery without angina pectoris: Secondary | ICD-10-CM

## 2022-01-03 DIAGNOSIS — J449 Chronic obstructive pulmonary disease, unspecified: Secondary | ICD-10-CM

## 2022-01-03 DIAGNOSIS — I10 Essential (primary) hypertension: Secondary | ICD-10-CM

## 2022-01-03 DIAGNOSIS — E7849 Other hyperlipidemia: Secondary | ICD-10-CM

## 2022-01-03 DIAGNOSIS — Z01818 Encounter for other preprocedural examination: Secondary | ICD-10-CM

## 2022-01-05 ENCOUNTER — Ambulatory Visit: Payer: Medicare PPO | Admitting: Surgery

## 2022-01-05 ENCOUNTER — Telehealth: Payer: Self-pay | Admitting: Family Medicine

## 2022-01-05 ENCOUNTER — Encounter: Payer: Self-pay | Admitting: Surgery

## 2022-01-05 VITALS — BP 86/58 | HR 94 | Temp 97.1°F | Resp 18 | Ht 61.0 in | Wt 125.0 lb

## 2022-01-05 DIAGNOSIS — I7143 Infrarenal abdominal aortic aneurysm, without rupture: Secondary | ICD-10-CM

## 2022-01-05 NOTE — Telephone Encounter (Signed)
lmtcb

## 2022-01-05 NOTE — Telephone Encounter (Signed)
Patient daughter called and wanted to request rehab for her and would like a call back [phone].

## 2022-01-05 NOTE — Progress Notes (Signed)
[facility] [place]  Patient name: [name] MRN: [id number] DOB: 25-Jun-1931 Sex: female   REQUESTING PROVIDER:    Dr. Louanne Skye   REASON FOR CONSULT:    AAA  HISTORY OF PRESENT ILLNESS:   [name] is a 88 y.o. female, who is referred for evaluation of an abdominal aortic aneurysm.  This was detected on CT scan recently when she was in the hospital for a fall.  She suffered compression fractures in her back.  Patient suffers from COPD.  She is on 3 L continuous oxygen.  She has chronic systolic heart failure.  Her ejection fraction is around 40%.  She also suffers from chronic anemia.  She has been admitted to the hospital previously for COPD exacerbations.  She does minimal walking.  She does have some dementia.  PAST MEDICAL HISTORY    Past Medical History:  Diagnosis Date   Abdominal pain 04/05/2017   Allergic rhinitis, cause unspecified    Allergy, unspecified not elsewhere classified    Anemia, unspecified 04/10/2008   Qualifier: Diagnosis of  By: Linna Darner MD, William     Anxiety    Anxiety state 03/01/2008   Qualifier: Diagnosis of  By: Council Mechanic MD, Hilaria Ota    Asthma    Breast cancer ([place]) 08/2018   right breast   Breast mass 08/28/2018   Chronic kidney disease    frequency   COMMON MIGRAINE 12/24/2006   Qualifier: Diagnosis of  By: Fuller Plan CMA (AAMA), Lugene     COPD (chronic obstructive pulmonary disease) ([place])    Degeneration of intervertebral disc, site unspecified    Diverticulosis of colon (without mention of hemorrhage)    DNR (do not resuscitate) 09/08/2019   Dysphagia 02/18/2019   Encounter for chronic pain management 07/26/2016   Indication for chronic opioid: chronic back pain Medication and dose:  Oxycodone '10mg'$  TID # pills per month: 90 Last UDS date: 04/15/15 Pain contract signed (Y/N):  yes Date narcotic database last reviewed (include red flags):  05/06/17   Esophageal reflux     Fibromyalgia 12/24/2006   Qualifier: Diagnosis of  By: Fuller Plan CMA (AAMA), Lugene     Headache    HTN (hypertension) 06/09/2013   Hyperlipidemia with target LDL less than 70 12/24/2006   Qualifier: Diagnosis of  By: Fuller Plan CMA ([place]), Lugene     Hypokalemia 05/24/2009   Qualifier: Diagnosis of  By: Lacretia Nicks     Hypothyroidism 12/24/2006   Qualifier: Diagnosis of  By: Fuller Plan CMA (AAMA), Lugene     Insomnia 12/24/2006   she had trials of antihistamines and TCAs prev w/o effect.      Insomnia, unspecified    Lower GI bleeding 09/09/2019   Myalgia and myositis, unspecified    Need for prophylactic hormone replacement therapy (postmenopausal)    Osteoarthritis of right knee 08/27/2011   R knee pain, s/p arthroscopy 2012 per [name]    Osteoporosis 06/23/2007   Qualifier: Diagnosis of  By: Council Mechanic MD, Hilaria Ota    Osteoporosis, unspecified    Other abnormal blood chemistry    Other and unspecified hyperlipidemia    Other chronic pain    Presence of drug coated stent in right coronary artery 01/30/2019   Stroke [facility])    mini strokes   Syncope 01/24/2017   Thoracic aortic aneurysm ([place]) 04/10/2017   Unspecified essential hypertension    Unspecified hypothyroidism    Vitamin D deficiency 09/26/2016     FAMILY HISTORY   Family  History  Problem Relation Age of Onset   Lung cancer Brother        x2   Hypertension Father    Heart disease Father    Heart disease Mother    Asthma Sister     SOCIAL HISTORY:   Social History   Socioeconomic History   Marital status: Married    Spouse name: Not on file   Number of children: Not on file   Years of education: Not on file   Highest education level: Not on file  Occupational History   Occupation: Housewife  Tobacco Use   Smoking status: Former    Packs/day: 1.50    Years: 10.00    Total pack years: 15.00    Types: Cigarettes    Quit date: 07/09/1985    Years since quitting: 36.5   Smokeless tobacco: Never   Tobacco  comments:    1 1/2 ppd x 20 years  Substance and Sexual Activity   Alcohol use: No    Alcohol/week: 0.0 standard drinks of alcohol   Drug use: No   Sexual activity: Never  Other Topics Concern   Not on file  Social History Narrative   Widowed 2019 after 4 years of marriage.     Social Determinants of Health   Financial Resource Strain: High Risk (04/11/2021)   Overall Financial Resource Strain (CARDIA)    Difficulty of Paying Living Expenses: Hard  Food Insecurity: No Food Insecurity (05/17/2021)   Hunger Vital Sign    Worried About Running Out of Food in the Last Year: Never true    Ran Out of Food in the Last Year: Never true  Transportation Needs: Not on file  Physical Activity: Not on file  Stress: Not on file  Social Connections: Moderately Isolated (05/17/2021)   Social Connection and Isolation Panel [NHANES]    Frequency of Communication with Friends and Family: More than three times a week    Frequency of Social Gatherings with Friends and Family: Once a week    Attends Religious Services: 1 to 4 times per year    Active Member of [employer] or Organizations: No    Attends Archivist Meetings: Never    Marital Status: Widowed  Human resources officer Violence: Not on file    ALLERGIES:    Allergies  Allergen Reactions   Doxycycline Other (See Comments)    GI upset   Sulfadiazine Other (See Comments)    REACTION: Fever, aches   Sulfa Antibiotics Rash    CURRENT MEDICATIONS:    Current Outpatient Medications  Medication Sig Dispense Refill   acetaminophen (TYLENOL) 500 MG tablet Take 1,000 mg by mouth every 6 (six) hours as needed for moderate pain or headache.     albuterol (PROAIR HFA) 108 (90 Base) MCG/ACT inhaler Inhale 1-2 puffs into the lungs every 6 (six) hours as needed for wheezing or shortness of breath (Okay to fill with Ventolin or albuterol HFA). 18 g 3   alendronate (FOSAMAX) 70 MG tablet Take 1 tablet (70 mg total) by mouth every Monday. 4 tablet  1   ALPRAZolam (XANAX) 0.25 MG tablet Take 1 tablet (0.25 mg total) by mouth 3 (three) times daily as needed for anxiety or sleep. 10 tablet 0   anastrozole (ARIMIDEX) 1 MG tablet Take 1 tablet (1 mg total) by mouth daily. 90 tablet 4   busPIRone (BUSPAR) 5 MG tablet TAKE ONE TABLET BY MOUTH IN THE MORNING AND IN THE EVENING 60 tablet 0  Calcium Carbonate (CALTRATE 600 PO) Take 600 mg by mouth daily.     cholecalciferol (VITAMIN D) 1000 units tablet Take 1 tablet (1,000 Units total) by mouth daily.     donepezil (ARICEPT) 5 MG tablet Take 1 tablet (5 mg total) by mouth at bedtime. (Patient taking differently: Take 5 mg by mouth at bedtime as needed (memory).) 90 tablet 0   DULoxetine (CYMBALTA) 60 MG capsule TAKE ONE CAPSULE BY MOUTH DAILY 90 capsule 0   ferrous sulfate (FERROUSUL) 325 (65 FE) MG tablet Take 1 tablet (325 mg total) by mouth daily with breakfast.     fluticasone (FLONASE) 50 MCG/ACT nasal spray PLACE 2 SPRAYS INTO BOTH NOSTRILS DAILY. (Patient taking differently: Place 2 sprays into both nostrils daily as needed for allergies.) 16 g 1   gabapentin (NEURONTIN) 300 MG capsule Take 300 mg by mouth 3 (three) times daily.     HYDROcodone-acetaminophen (NORCO) 10-325 MG tablet Take 1 tablet by mouth every 4 (four) hours as needed for severe pain. 40 tablet 0   ipratropium-albuterol (DUONEB) 0.5-2.5 (3) MG/3ML SOLN Take 3 mLs by nebulization 2 (two) times daily. 360 mL    levothyroxine (SYNTHROID) 112 MCG tablet TAKE ONE TABLET BY MOUTH DAILY 90 tablet 1   methocarbamol (ROBAXIN) 500 MG tablet Take 1 tablet (500 mg total) by mouth every 6 (six) hours as needed for muscle spasms. Please contact clinic about follow up with palliative care 40 tablet 1   mometasone-formoterol (DULERA) 200-5 MCG/ACT AERO Inhale 2 puffs into the lungs 2 (two) times daily. 1 each    Multiple Vitamin (MULTIVITAMIN WITH MINERALS) TABS tablet Take 1 tablet by mouth daily.     nitroGLYCERIN (NITROSTAT) 0.4 MG SL  tablet Place 1 tablet (0.4 mg total) under the tongue every 5 (five) minutes as needed for chest pain. 25 tablet 6   OXYGEN Inhale 3-3.5 L into the lungs continuous.     pantoprazole (PROTONIX) 20 MG tablet TAKE ONE TABLET BY MOUTH TWICE DAILY 180 tablet 0   PERCOCET 5-325 MG tablet Take 0.5 tablets by mouth 3 (three) times daily.     polyethylene glycol (MIRALAX / GLYCOLAX) 17 g packet Take 17 g by mouth daily as needed for moderate constipation. 14 each 0   rosuvastatin (CRESTOR) 10 MG tablet Take 1 tablet (10 mg total) by mouth daily. 90 tablet 0   senna-docusate (SENOKOT-S) 8.6-50 MG tablet Take 1 tablet by mouth at bedtime.     SPIRIVA HANDIHALER 18 MCG inhalation capsule INHALE THE CONTENTS OF ONE CAPSULE VIA HANDIHALER DAILY (Patient taking differently: 18 mcg daily as needed (sob).) 30 capsule 0   predniSONE (DELTASONE) 10 MG tablet Please take prednisone with breakfast, please take '30mg'$  on 6/8, then '20mg'$  on 6/9, then '10mg'$  on 6/10, then stop. (Patient not taking: Reported on 01/05/2022) 30 tablet 0   No current facility-administered medications for this visit.    REVIEW OF SYSTEMS:   '[X]'$  denotes positive finding, '[ ]'$  denotes negative finding Cardiac  Comments:  Chest pain or chest pressure: x   Shortness of breath upon exertion: x   Short of breath when lying flat: x   Irregular heart rhythm:        Vascular    Pain in calf, thigh, or hip brought on by ambulation:    Pain in feet at night that wakes you up from your sleep:     Blood clot in your veins:    Leg swelling:  Pulmonary    Oxygen at home: x   Productive cough:     Wheezing:         Neurologic    Sudden weakness in arms or legs:  x   Sudden numbness in arms or legs:  x   Sudden onset of difficulty speaking or slurred speech: x   Temporary loss of vision in one eye:  xx   Problems with dizziness:         Gastrointestinal    Blood in stool:      Vomited blood:         Genitourinary    Burning when  urinating:     Blood in urine:        Psychiatric    Major depression:         Hematologic    Bleeding problems:    Problems with blood clotting too easily:        Skin    Rashes or ulcers:        Constitutional    Fever or chills:     PHYSICAL EXAM:   Vitals:   01/05/22 0941  BP: (!) 86/58  Pulse: 94  Resp: 18  Temp: (!) 97.1 F (36.2 C)  TempSrc: Oral  Weight: 125 lb (56.7 kg)  Height: '5\' 1"'$  (1.549 m)    GENERAL: The patient is a well-nourished female, in no acute distress. The vital signs are documented above. CARDIAC: There is a regular rate and rhythm.  VASCULAR: Palpable pedal pulses PULMONARY: Nonlabored respirations ABDOMEN: Soft and non-tender  MUSCULOSKELETAL: There are no major deformities or cyanosis. NEUROLOGIC: No focal weakness or paresthesias are detected. SKIN: There are no ulcers or rashes noted. PSYCHIATRIC: The patient has a normal affect.  STUDIES:   I have reviewed the patient's CT scan.  The report states a 5 cm aneurysm, however on my review it is not this big.  I measured it at 4.4 cm.  I spoke with Dr. Serafina Royals with interventional radiology who reviewed the CT scan and agrees and has addended the report.  The maximum diameter is 4.6 cm  ASSESSMENT and PLAN   AAA: Maximum aortic diameter is 4.6 cm.  Because of her underlying COPD, age and other comorbidities, she is not a good operative candidate.  Fortunately she is not at the size where I would recommend repair.  I have recommended follow-up in 1 year with a repeat ultrasound.  It sounds like she is considering kyphoplasty for her compression fractures.  There is no contraindication for this in regards to her aneurysm.   Leia Alf, MD, FACS [facility] [phone] Pager [phone]

## 2022-01-08 ENCOUNTER — Telehealth: Payer: Self-pay | Admitting: Specialist

## 2022-01-08 ENCOUNTER — Telehealth: Payer: Self-pay

## 2022-01-08 DIAGNOSIS — S22000S Wedge compression fracture of unspecified thoracic vertebra, sequela: Secondary | ICD-10-CM

## 2022-01-08 DIAGNOSIS — J9610 Chronic respiratory failure, unspecified whether with hypoxia or hypercapnia: Secondary | ICD-10-CM

## 2022-01-08 DIAGNOSIS — M818 Other osteoporosis without current pathological fracture: Secondary | ICD-10-CM

## 2022-01-08 NOTE — Telephone Encounter (Signed)
Daughter called back in another phone note. Closing duplicate note.

## 2022-01-08 NOTE — Telephone Encounter (Signed)
Patient's daughter is calling in stating that she wanted to know if there is anyway to get Rafaella back in to a rehab facility, as [name] believes that the progress she made is declining now that she is at home. Asked for a call back in regards to this, declined to schedule an appt states she just wants to know.

## 2022-01-08 NOTE — Telephone Encounter (Signed)
Called and spoke with patients daughter [name] whom also put other daughter on the phone [name]) as well. Patient had compression fractures and was sent to skilled nursing facility for PT. Patient could not do PT as it was severely painful so they discharged her from skilled nursing. Patients daughters feel like she really needs to go back into skilled nursing or a facility where she can be taking care of 24/7. They are to the point they really can't take care of her anymore. She is having a really hard time at night with wetting the bed and same with during the day. She's in so much pain and can not move around; hard for her to get out of bed so most of the time she just lays around. She is taking hydrocodone and tylenol in between when she can't have hydrocodone. She is supposed to be getting some kind of injections for the fractures and was cleared by cardio to do so but still has to have a CT done. They are waiting on all of this to be done and states they need help with their mother until then as they think she will go back into rehab for more PT once the procedure is done. [name] and [name] wants to know if there is anything we can do?

## 2022-01-08 NOTE — Telephone Encounter (Signed)
Pt daughter called and would like to know if you talked with Dr.Vance about pt getting injection in her back?   Cb [phone]

## 2022-01-08 NOTE — Addendum Note (Signed)
Addended by: Sherrilee Gilles B on: 01/08/2022 03:48 PM   Modules accepted: Orders

## 2022-01-10 NOTE — Addendum Note (Signed)
Addended by: Tonia Ghent on: 01/10/2022 05:41 PM   Modules accepted: Orders

## 2022-01-10 NOTE — Telephone Encounter (Signed)
I put in a social work referral to see about placement options.  The family may need to start the bed search in the meantime.  I can fill out a FL2 if needed but patient needs a slot for admission first.  Thanks.

## 2022-01-11 ENCOUNTER — Telehealth: Payer: Self-pay | Admitting: *Deleted

## 2022-01-11 ENCOUNTER — Telehealth: Payer: Self-pay

## 2022-01-11 ENCOUNTER — Telehealth: Payer: Self-pay | Admitting: Radiology

## 2022-01-11 NOTE — Telephone Encounter (Signed)
I called [name], pts daughter, and advised that the referral was sent on 01/01/22 and that with this being a holiday week it may take a little longer to get a call back to get this scheduled, she states that she understands

## 2022-01-11 NOTE — Chronic Care Management (AMB) (Signed)
  Chronic Care Management   Note  01/11/2022 Name: Katriel Cutsforth MRN: [id number] DOB: 02/10/1931  Reneta Niehaus is a 88 y.o. year old female who is a primary care patient of Tonia Ghent, MD. I reached out to [name] by phone today in response to a referral sent by Ms. Lawana Chambers Shearon's PCP.  Ms. Purrington was given information about Chronic Care Management services today including:  CCM service includes personalized support from designated clinical staff supervised by her physician, including individualized plan of care and coordination with other care providers 24/7 contact phone numbers for assistance for urgent and routine care needs. Service will only be billed when office clinical staff spend 20 minutes or more in a month to coordinate care. Only one practitioner may furnish and bill the service in a calendar month. The patient may stop CCM services at any time (effective at the end of the month) by phone call to the office staff. The patient is responsible for co-pay (up to 20% after annual deductible is met) if co-pay is required by the individual health plan.   Daughter Confirmed Hassell Done (POA),Tina verbally agreed to assistance and services provided by embedded care coordination/care management team today.  Follow up plan: Telephone appointment with care management team member scheduled for:01/12/22  [place] [facility]  Direct Dial: [phone]

## 2022-01-11 NOTE — Telephone Encounter (Signed)
I called [name], pts daughter, and advised that the referral was sent on 01/01/22 and that with this being a holiday week it may take a little longer to get a call back to get this scheduled, she states that she understands

## 2022-01-11 NOTE — Telephone Encounter (Signed)
Pt's daughter, [name], called stating that her mother met with Dr Trula Slade on 6/30 and she had questions.  Reviewed pt's chart, returned call, two identifiers used. She needed direction on who to reach out to regarding her mother's possible kyphoplasty. Advised her to call the orthopedist's office to get the spinal surgeon lined up, if they chose to pursue that. Confirmed understanding.

## 2022-01-11 NOTE — Telephone Encounter (Signed)
LM for marty to call back

## 2022-01-11 NOTE — Telephone Encounter (Signed)
[name] returned call and I notified her about the social worker referral and appt has already been made for 01/12/22 at 11:00 am.

## 2022-01-12 ENCOUNTER — Ambulatory Visit (INDEPENDENT_AMBULATORY_CARE_PROVIDER_SITE_OTHER): Payer: Medicare PPO | Admitting: *Deleted

## 2022-01-12 DIAGNOSIS — G894 Chronic pain syndrome: Secondary | ICD-10-CM

## 2022-01-12 DIAGNOSIS — S22000S Wedge compression fracture of unspecified thoracic vertebra, sequela: Secondary | ICD-10-CM

## 2022-01-12 DIAGNOSIS — I10 Essential (primary) hypertension: Secondary | ICD-10-CM

## 2022-01-12 DIAGNOSIS — J449 Chronic obstructive pulmonary disease, unspecified: Secondary | ICD-10-CM

## 2022-01-12 DIAGNOSIS — M810 Age-related osteoporosis without current pathological fracture: Secondary | ICD-10-CM

## 2022-01-12 NOTE — Telephone Encounter (Signed)
I put in the referral.  Thanks.  

## 2022-01-12 NOTE — Telephone Encounter (Signed)
Received call from Eduard Clos; social worker about patient after her appt with the patient and her two daughters. She is requesting we do referral to [facility] for PT, OT, nursing and social work to help the family until something else can be done. She is waiting to have a procedure done and may go into skilled nursing again after that but date for procedure is unknown at this time.

## 2022-01-12 NOTE — Addendum Note (Signed)
Addended by: Tonia Ghent on: 01/12/2022 02:18 PM   Modules accepted: Orders

## 2022-01-13 NOTE — Chronic Care Management (AMB) (Signed)
Chronic Care Management    Clinical Social Work Note  01/13/2022 Name: Renia Mikelson MRN: [id number] DOB: [birth year]  Ralyn Stlaurent is a 88 y.o. year old female who is a primary care patient of Tonia Ghent, MD. The CCM team was consulted to assist the patient with chronic disease management and/or care coordination needs related to: [employer] , Level of Care Concerns, and Caregiver Stress.   Engaged with patient by telephone for initial visit in response to provider referral for social work chronic care management and care coordination services.   Consent to Services:  The patient was given information about Chronic Care Management services, agreed to services, and gave verbal consent prior to initiation of services.  Please see initial visit note for detailed documentation.   Patient agreed to services and consent obtained.   Assessment: Review of patient past medical history, allergies, medications, and health status, including review of relevant consultants reports was performed today as part of a comprehensive evaluation and provision of chronic care management and care coordination services.     SDOH (Social Determinants of Health) assessments and interventions performed:  SDOH Interventions    Flowsheet Row Most Recent Value  SDOH Interventions   Physical Activity Interventions Other (Comments)  [PCP to place orders with [facility] for PT/OT]  Transportation Interventions Intervention Not Indicated        Advanced Directives Status: See Care Plan for related entries.  CCM Care Plan  Allergies  Allergen Reactions   Doxycycline Other (See Comments)    GI upset   Sulfadiazine Other (See Comments)    REACTION: Fever, aches   Sulfa Antibiotics Rash    Outpatient Encounter Medications as of 01/12/2022  Medication Sig   acetaminophen (TYLENOL) 500 MG tablet Take 1,000 mg by mouth every 6 (six) hours as needed for moderate pain or headache.   albuterol  (PROAIR HFA) 108 (90 Base) MCG/ACT inhaler Inhale 1-2 puffs into the lungs every 6 (six) hours as needed for wheezing or shortness of breath (Okay to fill with Ventolin or albuterol HFA).   alendronate (FOSAMAX) 70 MG tablet Take 1 tablet (70 mg total) by mouth every Monday.   ALPRAZolam (XANAX) 0.25 MG tablet Take 1 tablet (0.25 mg total) by mouth 3 (three) times daily as needed for anxiety or sleep.   anastrozole (ARIMIDEX) 1 MG tablet Take 1 tablet (1 mg total) by mouth daily.   busPIRone (BUSPAR) 5 MG tablet TAKE ONE TABLET BY MOUTH IN THE MORNING AND IN THE EVENING   Calcium Carbonate (CALTRATE 600 PO) Take 600 mg by mouth daily.   cholecalciferol (VITAMIN D) 1000 units tablet Take 1 tablet (1,000 Units total) by mouth daily.   donepezil (ARICEPT) 5 MG tablet Take 1 tablet (5 mg total) by mouth at bedtime. (Patient taking differently: Take 5 mg by mouth at bedtime as needed (memory).)   DULoxetine (CYMBALTA) 60 MG capsule TAKE ONE CAPSULE BY MOUTH DAILY   ferrous sulfate (FERROUSUL) 325 (65 FE) MG tablet Take 1 tablet (325 mg total) by mouth daily with breakfast.   fluticasone (FLONASE) 50 MCG/ACT nasal spray PLACE 2 SPRAYS INTO BOTH NOSTRILS DAILY. (Patient taking differently: Place 2 sprays into both nostrils daily as needed for allergies.)   HYDROcodone-acetaminophen (NORCO) 10-325 MG tablet Take 1 tablet by mouth every 4 (four) hours as needed for severe pain.   ipratropium-albuterol (DUONEB) 0.5-2.5 (3) MG/3ML SOLN Take 3 mLs by nebulization 2 (two) times daily.   levothyroxine (SYNTHROID) 112 MCG tablet  TAKE ONE TABLET BY MOUTH DAILY   methocarbamol (ROBAXIN) 500 MG tablet Take 1 tablet (500 mg total) by mouth every 6 (six) hours as needed for muscle spasms. Please contact clinic about follow up with palliative care   mometasone-formoterol (DULERA) 200-5 MCG/ACT AERO Inhale 2 puffs into the lungs 2 (two) times daily.   Multiple Vitamin (MULTIVITAMIN WITH MINERALS) TABS tablet Take 1 tablet  by mouth daily.   nitroGLYCERIN (NITROSTAT) 0.4 MG SL tablet Place 1 tablet (0.4 mg total) under the tongue every 5 (five) minutes as needed for chest pain.   OXYGEN Inhale 3-3.5 L into the lungs continuous.   pantoprazole (PROTONIX) 20 MG tablet TAKE ONE TABLET BY MOUTH TWICE DAILY   PERCOCET 5-325 MG tablet Take 0.5 tablets by mouth 3 (three) times daily.   polyethylene glycol (MIRALAX / GLYCOLAX) 17 g packet Take 17 g by mouth daily as needed for moderate constipation.   rosuvastatin (CRESTOR) 10 MG tablet Take 1 tablet (10 mg total) by mouth daily.   senna-docusate (SENOKOT-S) 8.6-50 MG tablet Take 1 tablet by mouth at bedtime.   SPIRIVA HANDIHALER 18 MCG inhalation capsule INHALE THE CONTENTS OF ONE CAPSULE VIA HANDIHALER DAILY (Patient taking differently: 18 mcg daily as needed (sob).)   [DISCONTINUED] bisoprolol (ZEBETA) 5 MG tablet Take 0.5 tablets (2.5 mg total) by mouth daily.   No facility-administered encounter medications on file as of 01/12/2022.    Patient Active Problem List   Diagnosis Date Noted   Acute exacerbation of chronic obstructive pulmonary disease (COPD) ([place]) 77/82/4235   Acute metabolic encephalopathy 36/14/4315   Ground-level fall 12/06/2021   Breast cancer ([place])    Chronic systolic CHF (congestive heart failure) ([place])    Syncope 09/25/2021   Generalized weakness 07/07/2020   Decreased appetite 09/20/2019   Lower GI bleeding 09/09/2019   DNR (do not resuscitate) 09/08/2019   Chronic anemia 09/07/2019   Macrocytic anemia 09/07/2019   Jerking 09/07/2019   Fall at home, initial encounter 09/07/2019   History of CVA (cerebrovascular accident) 09/07/2019   History of breast cancer 09/07/2019   Pelvic pain 03/25/2019   Dysphagia 02/18/2019   Coronary artery disease involving native coronary artery of native heart with unstable angina pectoris ([place]) 01/30/2019   Presence of drug coated stent in right coronary artery 01/30/2019   Left main coronary artery  disease - ~50-60% dLM (&70% dLAD) -- Plan Med Rx 01/30/2019   Ischemic cardiomyopathy 01/30/2019   Acute ST elevation myocardial infarction (STEMI) of inferior wall ([place]) 01/29/2019   AAA (abdominal aortic aneurysm) ([place]) 12/31/2018   Malignant neoplasm of upper-outer quadrant of right breast in female, estrogen receptor positive ([place]) 09/26/2018   Breast mass 08/28/2018   Bilateral impacted cerumen 08/14/2017   Laryngopharyngeal reflux (LPR) 08/14/2017   Presbycusis of both ears 08/14/2017   Thoracic aortic aneurysm 04/10/2017   Abdominal pain 04/05/2017   Low back pain 01/01/2017   Medicare annual wellness visit, subsequent 09/26/2016   Hard of hearing 09/26/2016   Vitamin D deficiency 09/26/2016   Advance care planning 09/26/2016   Encounter for chronic pain management 07/26/2016   Edema 10/21/2014   Multinodular thyroid 07/07/2013   COPD (chronic obstructive pulmonary disease) ([place]) 06/09/2013   HTN (hypertension) 06/09/2013   Chronic respiratory failure with hypoxia ([place]) 05/13/2013   Cough 05/02/2013   Vertigo 12/31/2012   Acute blood loss anemia 12/13/2011   Osteoarthritis of right knee 08/27/2011   Gold C Copd with asthmatic bronchitis component and chronic resp failure 06/26/2011  Neck pain 04/02/2011   RISK OF FALLING 08/17/2010   Hypokalemia 05/24/2009   Anemia, unspecified 04/10/2008   Anxiety 03/01/2008   Osteoporosis 06/23/2007   Chronic pain syndrome 12/26/2006   Acquired hypothyroidism 12/24/2006   Hyperlipidemia with target LDL less than 70 12/24/2006   COMMON MIGRAINE 12/24/2006   GERD 12/24/2006   DEGENERATIVE DISC DISEASE 12/24/2006   Fibromyalgia 12/24/2006   Insomnia 12/24/2006   HYPERGLYCEMIA 12/24/2006    Conditions to be addressed/monitored: Osteoarthritis; Limited social support, Level of care concerns, Limited access to caregiver, and Lacks knowledge of community resource:    Care Plan : LCSW Plan of Care  Updates made by Deirdre Peer,  LCSW since 01/13/2022 12:00 AM     Problem: Social and Functional Symptoms      Long-Range Goal: Provide support and resources to optimize pt's social and functional abilities   Start Date: 01/12/2022  Expected End Date: 03/07/2022  This Visit's Progress: On track  Recent Progress: On track  Priority: High  Note:   Current barriers:    Patient in need of assistance with home support -vs- placement, connecting to community resources for Limited social support, Transportation, Social Isolation, and Limited access to caregiver Acknowledges deficits with meeting this unmet need Patient is unable to independently navigate community resource options without care coordination support Clinical Goals:  explore community resource options for unmet needs related to:  Transportation, Social Connections, and Physical Activity Clinical Interventions:  Spoke with pt's daughter, [name], who lives in [place] and is [place]. Also, did conference call with other daugher, [name], who lives local and near pt. Family indicates they think pt needs to return to SNF- pt recently released from SNF back to home (was at [facility] [facility]).  Pt is awaiting a khyphoplasty and is limited due to pain with her ADL's and personal care. Family has a caregiver with her during the day and [name] has been staying at night some.  CSW explained to them that pt appears to likely need custodial care (not covered by insurance and not SNF appropriate) and once she has the back surgery she can be re-evaluated for SNF need.  CSW suggested we ask PCP to place orders for [facility] PT, OT, RN, SW and family request bath aide and Speech Therapy (swallow issues?). Once [facility] PT assesses pt in the home we can get their recommendation for level of care needs.  Provided them with the [facility] # to request the dinner meal delivery (offered after hospital/SNF stay)  Also, discussed with daughters she may be eligible for the [facility] program and they may want to  increased the private duty caregiver arrangements   Pt has life alert and cancelled her Meals on Wheels, per daughter. Collaboration with Tonia Ghent, MD regarding development and update of comprehensive plan of care as evidenced by provider attestation and co-signature Inter-disciplinary care team collaboration (see longitudinal plan of care) Assessment of needs, barriers , agencies contacted, as well as how impacting  Review various resources, discussed options and provided patient information about Limited access to caregiver and Lacks knowledge of community resource:   Collaborated with appropriate clinical care team members regarding patient needs Social Isolation, Limited access to caregiver, and Lacks knowledge of community resource:   , CAD and COPD Patient interviewed and appropriate assessments performed Referred patient to community resources care guide team for assistance with [employer] program, private duty care support Provided patient with information about private duty care Discussed plans with patient for  ongoing care management follow up and provided patient with direct contact information for care management team Other interventions provided: Solution-Focused Strategies  Active listening / Reflection utilized  Emotional Support Provided Patient Goals:  -consider hiring in-home private duty caregiver/companion -expect call from [place] agency to begin PT/OT etc -review [employer] program for eligibility - begin a notebook of services in my neighborhood or community - call 211 when I need some help - follow-up on any referrals for help I am given - think ahead to make sure my need does not become an emergency - make a note about what I need to have by the phone or take with me, like an identification card or social security number have a back-up plan - have a back-up plan - make a list of family or friends that I can call    Follow Up Plan:  Appointment scheduled for SW follow up with client by phone on:  01/19/22           Eduard Clos MSW, [place] Licensed Clinical Social Worker [place]   [phone]

## 2022-01-13 NOTE — Patient Instructions (Signed)
Visit Information   Thank you for taking time to visit with me today. Please don't hesitate to contact me if I can be of assistance to you before our next scheduled telephone appointment.  Following are the goals we discussed today:  (Copy and paste patient goals from clinical care plan here)  Our next appointment is by telephone on 01/19/22    Please call the care guide team at [phone] if you need to cancel or reschedule your appointment.   If you are experiencing a Mental Health or [place] or need someone to talk to, please call 911   Following is a copy of your full care plan:  Care Plan : LCSW Plan of Care  Updates made by Deirdre Peer, LCSW since 01/13/2022 12:00 AM     Problem: Social and Functional Symptoms      Long-Range Goal: Provide support and resources to optimize pt's social and functional abilities   Start Date: 01/12/2022  Expected End Date: 03/07/2022  This Visit's Progress: On track  Recent Progress: On track  Priority: High  Note:   Current barriers:    Patient in need of assistance with home support -vs- placement, connecting to community resources for Limited social support, Transportation, Social Isolation, and Limited access to caregiver Acknowledges deficits with meeting this unmet need Patient is unable to independently navigate community resource options without care coordination support Clinical Goals:  explore community resource options for unmet needs related to:  Transportation, Social Connections, and Physical Activity Clinical Interventions:  Spoke with pt's daughter, [name], who lives in [place] and is [place]. Also, did conference call with other daugher, [name], who lives local and near pt. Family indicates they think pt needs to return to SNF- pt recently released from SNF back to home (was at [facility] and [employer]).  Pt is awaiting a khyphoplasty and is limited due to pain with her ADL's and personal care. Family has a  caregiver with her during the day and [name] has been staying at night some.  CSW explained to them that pt appears to likely need custodial care (not covered by insurance and not SNF appropriate) and once she has the back surgery she can be re-evaluated for SNF need.  CSW suggested we ask PCP to place orders for [facility] PT, OT, RN, SW and family request bath aide and Speech Therapy (swallow issues?). Once [facility] PT assesses pt in the home we can get their recommendation for level of care needs.  Provided them with the [facility] # to request the dinner meal delivery (offered after hospital/SNF stay)  Also, discussed with daughters she may be eligible for the [facility] program and they may want to increased the private duty caregiver arrangements   Pt has life alert and cancelled her Meals on Wheels, per daughter. Collaboration with Tonia Ghent, MD regarding development and update of comprehensive plan of care as evidenced by provider attestation and co-signature Inter-disciplinary care team collaboration (see longitudinal plan of care) Assessment of needs, barriers , agencies contacted, as well as how impacting  Review various resources, discussed options and provided patient information about Limited access to caregiver and Lacks knowledge of community resource:   Collaborated with appropriate clinical care team members regarding patient needs Social Isolation, Limited access to caregiver, and Lacks knowledge of community resource:   , CAD and COPD Patient interviewed and appropriate assessments performed Referred patient to community resources care guide team for assistance with [employer] program, private duty care support  Provided patient with information about private duty care Discussed plans with patient for ongoing care management follow up and provided patient with direct contact information for care management team Other interventions provided: Solution-Focused Strategies  Active listening /  Reflection utilized  Emotional Support Provided Patient Goals:  -consider hiring in-home private duty caregiver/companion -expect call from [place] agency to begin PT/OT etc -review [employer] program for eligibility - begin a notebook of services in my neighborhood or community - call 211 when I need some help - follow-up on any referrals for help I am given - think ahead to make sure my need does not become an emergency - make a note about what I need to have by the phone or take with me, like an identification card or social security number have a back-up plan - have a back-up plan - make a list of family or friends that I can call    Follow Up Plan: Appointment scheduled for SW follow up with client by phone on:  01/19/22     Consent to CCM Services: Ms. Coto was given information about Chronic Care Management services including:  CCM service includes personalized support from designated clinical staff supervised by her physician, including individualized plan of care and coordination with other care providers 24/7 contact phone numbers for assistance for urgent and routine care needs. Service will only be billed when office clinical staff spend 20 minutes or more in a month to coordinate care. Only one practitioner may furnish and bill the service in a calendar month. The patient may stop CCM services at any time (effective at the end of the month) by phone call to the office staff. The patient will be responsible for cost sharing (co-pay) of up to 20% of the service fee (after annual deductible is met).  Patient agreed to services and verbal consent obtained.   The patient verbalized understanding of instructions, educational materials, and care plan provided today and DECLINED offer to receive copy of patient instructions, educational materials, and care plan.   Telephone follow up appointment with care management team member scheduled for:01/19/22

## 2022-01-15 ENCOUNTER — Other Ambulatory Visit: Payer: Self-pay | Admitting: Specialist

## 2022-01-15 NOTE — Telephone Encounter (Signed)
Patient's daughter [name] called advised patient need Rx refilled Hydrocodone. [name] said patient ran out of her medication today. [name] said her mother was suppose to have an appointment with Dr. Vernard Gambles for a for an injection procedure but, has not heard from anyone yet. The number to contact [name] is [phone]

## 2022-01-16 ENCOUNTER — Telehealth: Payer: Self-pay | Admitting: Specialist

## 2022-01-16 ENCOUNTER — Telehealth: Payer: Self-pay | Admitting: Family Medicine

## 2022-01-16 NOTE — Telephone Encounter (Signed)
This is pending with Jeneen Rinks

## 2022-01-16 NOTE — Telephone Encounter (Signed)
Encourage patient to contact the pharmacy for refills or they can request refills through [facility]  Did the patient contact the pharmacy: No  NEXT APPOINTMENT DATE: 8.15.2023  MEDICATION: HYDROcodone-acetaminophen (NORCO) 10-325 MG tablet  Is the patient out of medication? Yes  If not, how much is left? N/A  Is this a 90 day supply: N/A  PHARMACY: [facility] [address], [place], [place] [address] Phone Number- [phone]  Let patient know to contact pharmacy at the end of the day to make sure medication is ready. Please notify patient to allow 48-72 hours to process

## 2022-01-16 NOTE — Telephone Encounter (Signed)
Pt called requesting a refill of pain medication. Please send to pharmacy on file. Pt phone number is [phone].

## 2022-01-16 NOTE — Telephone Encounter (Signed)
LOV - 11/02/21 NOV - 02/20/22 RF - 12/29/21 #40/0

## 2022-01-17 NOTE — Telephone Encounter (Signed)
I looks like this was coming through Dr. Louanne Skye with ortho. Please call his clinic and see if he is going to/willing to fill this.  Please let me know.  Thanks.

## 2022-01-17 NOTE — Telephone Encounter (Signed)
Daughter called back and patient in completely out and [name] is in a lot of pain.

## 2022-01-18 ENCOUNTER — Other Ambulatory Visit: Payer: Self-pay | Admitting: Surgery

## 2022-01-18 ENCOUNTER — Other Ambulatory Visit: Payer: Self-pay | Admitting: Family Medicine

## 2022-01-18 ENCOUNTER — Telehealth: Payer: Self-pay | Admitting: Surgery

## 2022-01-18 MED ORDER — HYDROCODONE-ACETAMINOPHEN 7.5-325 MG PO TABS: 1.0000 | ORAL_TABLET | Freq: Three times a day (TID) | ORAL | 0 refills | Status: DC | PRN

## 2022-01-18 MED ORDER — HYDROCODONE-ACETAMINOPHEN 10-325 MG PO TABS: 1.0000 | ORAL_TABLET | Freq: Three times a day (TID) | ORAL | 0 refills | Status: DC | PRN

## 2022-01-18 MED ORDER — HYDROCODONE-ACETAMINOPHEN 10-325 MG PO TABS: 0.5000 | ORAL_TABLET | Freq: Four times a day (QID) | ORAL | 0 refills | Status: DC | PRN

## 2022-01-18 NOTE — Telephone Encounter (Signed)
Patient daughter called back and said that Dr Louanne Skye is out on vacation this week. She wants to know if Dr Damita Dunnings can fill it for now. Please advise. She said she doesn't know when he gets back in

## 2022-01-18 NOTE — Telephone Encounter (Signed)
Called patients daughter [name] and advised to call Dr. Louanne Skye with ortho for refill. She states she will call and see if they will refill rx.

## 2022-01-18 NOTE — Telephone Encounter (Signed)
Patient's daughter [name] called asking if she can get a call back when her mother's pain medication is sent to the pharmacy (Hydrocodone 10-325)?   I advised [name] the message was sent to the provider.  The number to contact [name] is [phone]

## 2022-01-18 NOTE — Addendum Note (Signed)
Addended by: Tonia Ghent on: 01/18/2022 02:15 PM   Modules accepted: Orders

## 2022-01-18 NOTE — Telephone Encounter (Signed)
Called and notified [name] that rx was sent and all future refills need to come from Dr. Louanne Skye.

## 2022-01-18 NOTE — Telephone Encounter (Addendum)
I sent it in the meantime.  Thanks. Routed to ortho for input on future refills.

## 2022-01-19 ENCOUNTER — Ambulatory Visit: Payer: Medicare PPO | Admitting: *Deleted

## 2022-01-19 ENCOUNTER — Encounter: Payer: Self-pay | Admitting: Specialist

## 2022-01-19 DIAGNOSIS — S22000S Wedge compression fracture of unspecified thoracic vertebra, sequela: Secondary | ICD-10-CM

## 2022-01-19 DIAGNOSIS — J449 Chronic obstructive pulmonary disease, unspecified: Secondary | ICD-10-CM

## 2022-01-19 DIAGNOSIS — F411 Generalized anxiety disorder: Secondary | ICD-10-CM

## 2022-01-19 NOTE — Chronic Care Management (AMB) (Signed)
Chronic Care Management    Clinical Social Work Note  01/19/2022 Name: [name] MRN: [id number] DOB: January 02, 1931  Makari Sanko is a 88 y.o. year old female who is a primary care patient of Tonia Ghent, MD. The CCM team was consulted to assist the patient with chronic disease management and/or care coordination needs related to: [employer] , Level of Care Concerns, and Caregiver Stress.   Engaged with patient by telephone for follow up visit in response to provider referral for social work chronic care management and care coordination services.   Consent to Services:  The patient was given information about Chronic Care Management services, agreed to services, and gave verbal consent prior to initiation of services.  Please see initial visit note for detailed documentation.   Patient agreed to services and consent obtained.   Assessment: Review of patient past medical history, allergies, medications, and health status, including review of relevant consultants reports was performed today as part of a comprehensive evaluation and provision of chronic care management and care coordination services.     SDOH (Social Determinants of Health) assessments and interventions performed:    Advanced Directives Status: See Care Plan for related entries.  CCM Care Plan  Allergies  Allergen Reactions   Doxycycline Other (See Comments)    GI upset   Sulfadiazine Other (See Comments)    REACTION: Fever, aches   Sulfa Antibiotics Rash    Outpatient Encounter Medications as of 01/19/2022  Medication Sig   albuterol (PROAIR HFA) 108 (90 Base) MCG/ACT inhaler Inhale 1-2 puffs into the lungs every 6 (six) hours as needed for wheezing or shortness of breath (Okay to fill with Ventolin or albuterol HFA).   alendronate (FOSAMAX) 70 MG tablet Take 1 tablet (70 mg total) by mouth every Monday.   ALPRAZolam (XANAX) 0.25 MG tablet Take 1 tablet (0.25 mg total) by mouth 3  (three) times daily as needed for anxiety or sleep.   anastrozole (ARIMIDEX) 1 MG tablet Take 1 tablet (1 mg total) by mouth daily.   busPIRone (BUSPAR) 5 MG tablet TAKE ONE TABLET BY MOUTH IN THE MORNING AND IN THE EVENING   Calcium Carbonate (CALTRATE 600 PO) Take 600 mg by mouth daily.   cholecalciferol (VITAMIN D) 1000 units tablet Take 1 tablet (1,000 Units total) by mouth daily.   donepezil (ARICEPT) 5 MG tablet Take 1 tablet (5 mg total) by mouth at bedtime. (Patient taking differently: Take 5 mg by mouth at bedtime as needed (memory).)   DULoxetine (CYMBALTA) 60 MG capsule TAKE ONE CAPSULE BY MOUTH DAILY   ferrous sulfate (FERROUSUL) 325 (65 FE) MG tablet Take 1 tablet (325 mg total) by mouth daily with breakfast.   fluticasone (FLONASE) 50 MCG/ACT nasal spray PLACE 2 SPRAYS INTO BOTH NOSTRILS DAILY. (Patient taking differently: Place 2 sprays into both nostrils daily as needed for allergies.)   HYDROcodone-acetaminophen (NORCO) 10-325 MG tablet Take 0.5-1 tablets by mouth every 6 (six) hours as needed.   ipratropium-albuterol (DUONEB) 0.5-2.5 (3) MG/3ML SOLN Take 3 mLs by nebulization 2 (two) times daily.   levothyroxine (SYNTHROID) 112 MCG tablet TAKE ONE TABLET BY MOUTH DAILY   methocarbamol (ROBAXIN) 500 MG tablet Take 1 tablet (500 mg total) by mouth every 6 (six) hours as needed for muscle spasms. Please contact clinic about follow up with palliative care   mometasone-formoterol (DULERA) 200-5 MCG/ACT AERO Inhale 2 puffs into the lungs 2 (two) times daily.   Multiple Vitamin (MULTIVITAMIN WITH MINERALS) TABS tablet  Take 1 tablet by mouth daily.   nitroGLYCERIN (NITROSTAT) 0.4 MG SL tablet Place 1 tablet (0.4 mg total) under the tongue every 5 (five) minutes as needed for chest pain.   OXYGEN Inhale 3-3.5 L into the lungs continuous.   pantoprazole (PROTONIX) 20 MG tablet TAKE ONE TABLET BY MOUTH TWICE DAILY   PERCOCET 5-325 MG tablet Take 0.5 tablets by mouth 3 (three) times daily.    polyethylene glycol (MIRALAX / GLYCOLAX) 17 g packet Take 17 g by mouth daily as needed for moderate constipation.   rosuvastatin (CRESTOR) 10 MG tablet Take 1 tablet (10 mg total) by mouth daily.   senna-docusate (SENOKOT-S) 8.6-50 MG tablet Take 1 tablet by mouth at bedtime.   SPIRIVA HANDIHALER 18 MCG inhalation capsule INHALE THE CONTENTS OF ONE CAPSULE VIA HANDIHALER DAILY (Patient taking differently: 18 mcg daily as needed (sob).)   [DISCONTINUED] bisoprolol (ZEBETA) 5 MG tablet Take 0.5 tablets (2.5 mg total) by mouth daily.   No facility-administered encounter medications on file as of 01/19/2022.    Patient Active Problem List   Diagnosis Date Noted   Acute exacerbation of chronic obstructive pulmonary disease (COPD) ([place]) 40/02/6760   Acute metabolic encephalopathy 95/03/3266   Ground-level fall 12/06/2021   Breast cancer ([place])    Chronic systolic CHF (congestive heart failure) ([place])    Syncope 09/25/2021   Generalized weakness 07/07/2020   Decreased appetite 09/20/2019   Lower GI bleeding 09/09/2019   DNR (do not resuscitate) 09/08/2019   Chronic anemia 09/07/2019   Macrocytic anemia 09/07/2019   Jerking 09/07/2019   Fall at home, initial encounter 09/07/2019   History of CVA (cerebrovascular accident) 09/07/2019   History of breast cancer 09/07/2019   Pelvic pain 03/25/2019   Dysphagia 02/18/2019   Coronary artery disease involving native coronary artery of native heart with unstable angina pectoris ([place]) 01/30/2019   Presence of drug coated stent in right coronary artery 01/30/2019   Left main coronary artery disease - ~50-60% dLM (&70% dLAD) -- Plan Med Rx 01/30/2019   Ischemic cardiomyopathy 01/30/2019   Acute ST elevation myocardial infarction (STEMI) of inferior wall ([place]) 01/29/2019   AAA (abdominal aortic aneurysm) ([place]) 12/31/2018   Malignant neoplasm of upper-outer quadrant of right breast in female, estrogen receptor positive ([place]) 09/26/2018   Breast mass  08/28/2018   Bilateral impacted cerumen 08/14/2017   Laryngopharyngeal reflux (LPR) 08/14/2017   Presbycusis of both ears 08/14/2017   Thoracic aortic aneurysm 04/10/2017   Abdominal pain 04/05/2017   Low back pain 01/01/2017   Medicare annual wellness visit, subsequent 09/26/2016   Hard of hearing 09/26/2016   Vitamin D deficiency 09/26/2016   Advance care planning 09/26/2016   Encounter for chronic pain management 07/26/2016   Edema 10/21/2014   Multinodular thyroid 07/07/2013   COPD (chronic obstructive pulmonary disease) ([place]) 06/09/2013   HTN (hypertension) 06/09/2013   Chronic respiratory failure with hypoxia ([place]) 05/13/2013   Cough 05/02/2013   Vertigo 12/31/2012   Acute blood loss anemia 12/13/2011   Osteoarthritis of right knee 08/27/2011   Gold C Copd with asthmatic bronchitis component and chronic resp failure 06/26/2011   Neck pain 04/02/2011   RISK OF FALLING 08/17/2010   Hypokalemia 05/24/2009   Anemia, unspecified 04/10/2008   Anxiety 03/01/2008   Osteoporosis 06/23/2007   Chronic pain syndrome 12/26/2006   Acquired hypothyroidism 12/24/2006   Hyperlipidemia with target LDL less than 70 12/24/2006   COMMON MIGRAINE 12/24/2006   GERD 12/24/2006   DEGENERATIVE [place] DISEASE 12/24/2006  Fibromyalgia 12/24/2006   Insomnia 12/24/2006   HYPERGLYCEMIA 12/24/2006    Conditions to be addressed/monitored: Osteoarthritis; Level of care concerns, Limited access to caregiver, and Lacks knowledge of community resource:    Care Plan : LCSW Plan of Care  Updates made by Deirdre Peer, LCSW since 01/19/2022 12:00 AM     Problem: Social and Functional Symptoms      Long-Range Goal: Provide support and resources to optimize pt's social and functional abilities Completed 01/19/2022  Start Date: 01/12/2022  Expected End Date: 03/07/2022  This Visit's Progress: On track  Recent Progress: On track  Priority: High  Note:   Current barriers:    Patient in need of  assistance with home support -vs- placement, connecting to community resources for Limited social support, Transportation, Social Isolation, and Limited access to caregiver Acknowledges deficits with meeting this unmet need Patient is unable to independently navigate community resource options without care coordination support Clinical Goals:  explore community resource options for unmet needs related to:  Transportation, Social Connections, and Physical Activity Clinical Interventions:  01/19/22- Spoke with pt's daughter, [name], who lives in [place] does not think they have heard back  and is HCPOA. She was not sure if [facility] has begun so CSW inquired with [facility] who states they got referral 01/16/22 and will be following up to begin. Family also indicates they have not heard about scheduling the back procedure for pt. CSW suggested family call ordering MD to inquire about this. CSW to follow up next week.    Also, did conference call with other daugher, [name], who lives local and near pt. Family indicates they think pt needs to return to SNF- pt recently released from SNF back to home (was at [facility] [employer]).  Pt is awaiting a khyphoplasty and is limited due to pain with her ADL's and personal care. Family has a caregiver with her during the day and [name] has been staying at night some.  CSW explained to them that pt appears to likely need custodial care (not covered by insurance and not SNF appropriate) and once she has the back surgery she can be re-evaluated for SNF need.  CSW suggested we ask PCP to place orders for [facility] PT, OT, RN, SW and family request bath aide and Speech Therapy (swallow issues?). Once [facility] PT assesses pt in the home we can get their recommendation for level of care needs.  Provided them with the [facility] # to request the dinner meal delivery (offered after hospital/SNF stay)  Also, discussed with daughters she may be eligible for the [facility] program and they may want to  increased the private duty caregiver arrangements   Pt has life alert and cancelled her Meals on Wheels, per daughter. Collaboration with Tonia Ghent, MD regarding development and update of comprehensive plan of care as evidenced by provider attestation and co-signature Inter-disciplinary care team collaboration (see longitudinal plan of care) Assessment of needs, barriers , agencies contacted, as well as how impacting  Review various resources, discussed options and provided patient information about Limited access to caregiver and Lacks knowledge of community resource:   Collaborated with appropriate clinical care team members regarding patient needs Social Isolation, Limited access to caregiver, and Lacks knowledge of community resource:   , CAD and COPD Patient interviewed and appropriate assessments performed Referred patient to community resources care guide team for assistance with [employer] program, private duty care support Provided patient with information about private duty care Discussed plans  with patient for ongoing care management follow up and provided patient with direct contact information for care management team Other interventions provided: Solution-Focused Strategies  Active listening / Reflection utilized  Emotional Support Provided Patient Goals:  -consider hiring in-home private duty caregiver/companion -expect call from [place] agency to begin PT/OT etc -review [employer] program for eligibility - begin a notebook of services in my neighborhood or community - call 211 when I need some help - follow-up on any referrals for help I am given - think ahead to make sure my need does not become an emergency - make a note about what I need to have by the phone or take with me, like an identification card or social security number have a back-up plan - have a back-up plan - make a list of family or friends that I can call    Follow Up Plan:  Appointment scheduled for SW follow up with client by phone on:  01/22/22           Eduard Clos MSW, [place] Licensed Clinical Social Worker [place]   [phone]

## 2022-01-19 NOTE — Patient Instructions (Signed)
Visit Information  Thank you for taking time to visit with me today. Please don't hesitate to contact me if I can be of assistance to you before our next scheduled telephone appointment.  Following are the goals we discussed today:  -consider hiring in-home private duty caregiver/companion -expect call from [place] agency to begin PT/OT etc -review [employer] program for eligibility - begin a notebook of services in my neighborhood or community - call 211 when I need some help - follow-up on any referrals for help I am given - think ahead to make sure my need does not become an emergency - make a note about what I need to have by the phone or take with me, like an identification card or social security number have a back-up plan - have a back-up plan - make a list of family or friends that I can call   Our next appointment is by telephone on 01/22/22    Please call the care guide team at [phone] if you need to cancel or reschedule your appointment.   If you are experiencing a Mental Health or [place] or need someone to talk to, please call 911   Patient verbalizes understanding of instructions and care plan provided today and agrees to view in [place]. Active MyChart status and patient understanding of how to access instructions and care plan via MyChart confirmed with patient.     Eduard Clos MSW, LCSW Licensed Clinical Social Worker [place]   [phone]

## 2022-01-19 NOTE — Telephone Encounter (Signed)
error 

## 2022-01-21 NOTE — Progress Notes (Signed)
Encounter details: CCM Time Spent       Value Time User   Time spent with patient (minutes)  40 01/19/2022  1:21 PM Deirdre Peer, LCSW   Time spent performing Chart review  25 01/19/2022  1:21 PM Deirdre Peer, LCSW   Total time (minutes)  65 01/19/2022  1:21 PM Deirdre Peer, LCSW       I have personally reviewed this encounter including the documentation in this note and have collaborated with the care management provider regarding care management and care coordination activities to include development and update of the comprehensive care plan. I am certifying that I agree with the content of this note and encounter as supervising physician/advanced practice provider.

## 2022-01-22 ENCOUNTER — Ambulatory Visit: Payer: Medicare PPO | Admitting: *Deleted

## 2022-01-22 ENCOUNTER — Telehealth: Payer: Self-pay | Admitting: *Deleted

## 2022-01-22 DIAGNOSIS — S22000S Wedge compression fracture of unspecified thoracic vertebra, sequela: Secondary | ICD-10-CM

## 2022-01-22 DIAGNOSIS — M810 Age-related osteoporosis without current pathological fracture: Secondary | ICD-10-CM

## 2022-01-22 DIAGNOSIS — J449 Chronic obstructive pulmonary disease, unspecified: Secondary | ICD-10-CM

## 2022-01-22 NOTE — Telephone Encounter (Signed)
Received call from [facility] wanting to know what status is jin getting scheduled for kyphoplasty, I left message with Gildardo Pounds at interventional radiology to return my call for status

## 2022-01-23 ENCOUNTER — Telehealth (HOSPITAL_COMMUNITY): Payer: Self-pay

## 2022-01-23 ENCOUNTER — Other Ambulatory Visit (HOSPITAL_COMMUNITY): Payer: Self-pay | Admitting: Interventional Radiology

## 2022-01-23 DIAGNOSIS — S22080A Wedge compression fracture of T11-T12 vertebra, initial encounter for closed fracture: Secondary | ICD-10-CM

## 2022-01-23 NOTE — Chronic Care Management (AMB) (Signed)
Chronic Care Management    Clinical Social Work Note  01/23/2022 Name: [name] MRN: [id number] DOB: [birth year]  [name] is a 88 y.o. year old female who is a primary care patient of Alice Ghent, MD. The CCM team was consulted to assist the patient with chronic disease management and/or care coordination needs related to: [employer] , Level of Care Concerns, and Caregiver Stress.   Engaged with patient by telephone for follow up visit in response to provider referral for social work chronic care management and care coordination services.   Consent to Services:  The patient was given information about Chronic Care Management services, agreed to services, and gave verbal consent prior to initiation of services.  Please see initial visit note for detailed documentation.   Patient agreed to services and consent obtained.   Assessment: Review of patient past medical history, allergies, medications, and health status, including review of relevant consultants reports was performed today as part of a comprehensive evaluation and provision of chronic care management and care coordination services.     SDOH (Social Determinants of Health) assessments and interventions performed:    Advanced Directives Status: See Care Plan for related entries.  CCM Care Plan  Allergies  Allergen Reactions   Doxycycline Other (See Comments)    GI upset   Sulfadiazine Other (See Comments)    REACTION: Fever, aches   Sulfa Antibiotics Rash    Outpatient Encounter Medications as of 01/22/2022  Medication Sig   albuterol (PROAIR HFA) 108 (90 Base) MCG/ACT inhaler Inhale 1-2 puffs into the lungs every 6 (six) hours as needed for wheezing or shortness of breath (Okay to fill with Ventolin or albuterol HFA).   alendronate (FOSAMAX) 70 MG tablet Take 1 tablet (70 mg total) by mouth every Monday.   ALPRAZolam (XANAX) 0.25 MG tablet Take 1 tablet (0.25 mg total) by mouth 3  (three) times daily as needed for anxiety or sleep.   anastrozole (ARIMIDEX) 1 MG tablet Take 1 tablet (1 mg total) by mouth daily.   busPIRone (BUSPAR) 5 MG tablet TAKE ONE TABLET BY MOUTH IN THE MORNING AND IN THE EVENING   Calcium Carbonate (CALTRATE 600 PO) Take 600 mg by mouth daily.   cholecalciferol (VITAMIN D) 1000 units tablet Take 1 tablet (1,000 Units total) by mouth daily.   donepezil (ARICEPT) 5 MG tablet Take 1 tablet (5 mg total) by mouth at bedtime. (Patient taking differently: Take 5 mg by mouth at bedtime as needed (memory).)   DULoxetine (CYMBALTA) 60 MG capsule TAKE ONE CAPSULE BY MOUTH DAILY   ferrous sulfate (FERROUSUL) 325 (65 FE) MG tablet Take 1 tablet (325 mg total) by mouth daily with breakfast.   fluticasone (FLONASE) 50 MCG/ACT nasal spray PLACE 2 SPRAYS INTO BOTH NOSTRILS DAILY. (Patient taking differently: Place 2 sprays into both nostrils daily as needed for allergies.)   HYDROcodone-acetaminophen (NORCO) 10-325 MG tablet Take 0.5-1 tablets by mouth every 6 (six) hours as needed.   ipratropium-albuterol (DUONEB) 0.5-2.5 (3) MG/3ML SOLN Take 3 mLs by nebulization 2 (two) times daily.   levothyroxine (SYNTHROID) 112 MCG tablet TAKE ONE TABLET BY MOUTH DAILY   methocarbamol (ROBAXIN) 500 MG tablet Take 1 tablet (500 mg total) by mouth every 6 (six) hours as needed for muscle spasms. Please contact clinic about follow up with palliative care   mometasone-formoterol (DULERA) 200-5 MCG/ACT AERO Inhale 2 puffs into the lungs 2 (two) times daily.   Multiple Vitamin (MULTIVITAMIN WITH MINERALS) TABS tablet  Take 1 tablet by mouth daily.   nitroGLYCERIN (NITROSTAT) 0.4 MG SL tablet Place 1 tablet (0.4 mg total) under the tongue every 5 (five) minutes as needed for chest pain.   OXYGEN Inhale 3-3.5 L into the lungs continuous.   pantoprazole (PROTONIX) 20 MG tablet TAKE ONE TABLET BY MOUTH TWICE DAILY   PERCOCET 5-325 MG tablet Take 0.5 tablets by mouth 3 (three) times daily.    polyethylene glycol (MIRALAX / GLYCOLAX) 17 g packet Take 17 g by mouth daily as needed for moderate constipation.   rosuvastatin (CRESTOR) 10 MG tablet Take 1 tablet (10 mg total) by mouth daily.   senna-docusate (SENOKOT-S) 8.6-50 MG tablet Take 1 tablet by mouth at bedtime.   SPIRIVA HANDIHALER 18 MCG inhalation capsule INHALE THE CONTENTS OF ONE CAPSULE VIA HANDIHALER DAILY (Patient taking differently: 18 mcg daily as needed (sob).)   No facility-administered encounter medications on file as of 01/22/2022.    Patient Active Problem List   Diagnosis Date Noted   Acute exacerbation of chronic obstructive pulmonary disease (COPD) ([place]) 54/65/0354   Acute metabolic encephalopathy 65/68/1275   Ground-level fall 12/06/2021   Breast cancer ([place])    Chronic systolic CHF (congestive heart failure) ([place])    Syncope 09/25/2021   Generalized weakness 07/07/2020   Decreased appetite 09/20/2019   Lower GI bleeding 09/09/2019   DNR (do not resuscitate) 09/08/2019   Chronic anemia 09/07/2019   Macrocytic anemia 09/07/2019   Jerking 09/07/2019   Fall at home, initial encounter 09/07/2019   History of CVA (cerebrovascular accident) 09/07/2019   History of breast cancer 09/07/2019   Pelvic pain 03/25/2019   Dysphagia 02/18/2019   Coronary artery disease involving native coronary artery of native heart with unstable angina pectoris ([place]) 01/30/2019   Presence of drug coated stent in right coronary artery 01/30/2019   Left main coronary artery disease - ~50-60% dLM (&70% dLAD) -- Plan Med Rx 01/30/2019   Ischemic cardiomyopathy 01/30/2019   Acute ST elevation myocardial infarction (STEMI) of inferior wall ([place]) 01/29/2019   AAA (abdominal aortic aneurysm) ([place]) 12/31/2018   Malignant neoplasm of upper-outer quadrant of right breast in female, estrogen receptor positive ([place]) 09/26/2018   Breast mass 08/28/2018   Bilateral impacted cerumen 08/14/2017   Laryngopharyngeal reflux (LPR) 08/14/2017    Presbycusis of both ears 08/14/2017   Thoracic aortic aneurysm 04/10/2017   Abdominal pain 04/05/2017   Low back pain 01/01/2017   Medicare annual wellness visit, subsequent 09/26/2016   Hard of hearing 09/26/2016   Vitamin D deficiency 09/26/2016   Advance care planning 09/26/2016   Encounter for chronic pain management 07/26/2016   Edema 10/21/2014   Multinodular thyroid 07/07/2013   COPD (chronic obstructive pulmonary disease) ([place]) 06/09/2013   HTN (hypertension) 06/09/2013   Chronic respiratory failure with hypoxia ([place]) 05/13/2013   Cough 05/02/2013   Vertigo 12/31/2012   Acute blood loss anemia 12/13/2011   Osteoarthritis of right knee 08/27/2011   Gold C Copd with asthmatic bronchitis component and chronic resp failure 06/26/2011   Neck pain 04/02/2011   RISK OF FALLING 08/17/2010   Hypokalemia 05/24/2009   Anemia, unspecified 04/10/2008   Anxiety 03/01/2008   Osteoporosis 06/23/2007   Chronic pain syndrome 12/26/2006   Acquired hypothyroidism 12/24/2006   Hyperlipidemia with target LDL less than 70 12/24/2006   COMMON MIGRAINE 12/24/2006   GERD 12/24/2006   DEGENERATIVE DISC DISEASE 12/24/2006   Fibromyalgia 12/24/2006   Insomnia 12/24/2006   HYPERGLYCEMIA 12/24/2006    Conditions to be  addressed/monitored: Osteoarthritis; Level of care concerns, ADL IADL limitations, and Limited access to caregiver  Care Plan : LCSW Plan of Care  Updates made by Deirdre Peer, LCSW since 01/23/2022 12:00 AM     Problem: Social and Functional Symptoms   Priority: High  Note:   Current barriers:    Level of care concerns, ADL IADL limitations, Limited access to caregiver, Cognitive Deficits, Inability to perform ADL's independently, and Lacks knowledge of community resource:   Clinical Goals: Patient will work with team  to address needs related to caregiving, planning, etc Clinical Interventions:  01/22/22- CSW spoke with Christianne Borrow, by phone who reports they are now  awaiting call back from the planner to schedule the kyphoplasty. Per daughter, they also had [place] visit yesterday and were told "1 day per week visits planned". CSW has left message for the [facility] team and will discuss concerns and needs for collaboration with them.    Assessment of needs, barriers , agencies contacted, as well as how impacting  Solution-Focused Strategies employed:  Active listening / Reflection utilized  Problem [place] strategies reviewed Caregiver stress acknowledged  Consideration of in-home help encouraged : options discussed Review various resources, discussed options and provided patient information about  Referral to care guide ( )  1:1 collaboration with primary care provider regarding development and update of comprehensive plan of care as evidenced by provider attestation and co-signature Inter-disciplinary care team collaboration (see longitudinal plan of care) Patient Goals/Self-Care Activities: Over the next 30 days -continue with in-home private duty caregiver/companion as needed -expect call from [place] agency to begin PT/OT etc -review [employer] program for eligibility - begin a notebook of services in my neighborhood or community - call 211 when I need some help - follow-up on any referrals for help I am given - think ahead to make sure my need does not become an emergency - make a note about what I need to have by the phone or take with me, like an identification card or social security number have a back-up plan - have a back-up plan - make a list of family or friends that I can call           Follow Up Plan: Appointment scheduled for SW follow up with client by phone on: 01/26/22      [name] MSW, [place] Licensed Clinical Social Worker [place]   [phone]

## 2022-01-23 NOTE — Patient Instructions (Signed)
Visit Information  Thank you for taking time to visit with me today. Please don't hesitate to contact me if I can be of assistance to you before our next scheduled telephone appointment.  Our next appointment is by telephone on 01/26/22   Please call the care guide team at [phone] if you need to cancel or reschedule your appointment.   If you are experiencing a Mental Health or [place] or need someone to talk to, please call 911   The patient verbalized understanding of instructions, educational materials, and care plan provided today and DECLINED offer to receive copy of patient instructions, educational materials, and care plan.   Eduard Clos MSW, LCSW Licensed Clinical Social Worker [place]   [phone]

## 2022-01-23 NOTE — Telephone Encounter (Signed)
Ok per Dr. Estanislado Pandy for T11 KP. I've sent a request for insurance for authorization. Will call pt to schedule. AW

## 2022-01-24 ENCOUNTER — Telehealth: Payer: Self-pay

## 2022-01-24 NOTE — Progress Notes (Signed)
Entered in Error

## 2022-01-25 ENCOUNTER — Telehealth: Payer: Self-pay | Admitting: Family Medicine

## 2022-01-25 NOTE — Telephone Encounter (Signed)
[name] from [facility] called in trying to confirm if Angeni is still taking the following medication: Trazodone and anastrozole (ARIMIDEX) 1 MG tablet. Please advise. Thank you.

## 2022-01-25 NOTE — Telephone Encounter (Signed)
Called pharmacy back and spoke with one of the pharmacist about medications. Per med list patient is no longer on trazodone and unsure about anastrozole as we are not the prescribing office.

## 2022-01-26 ENCOUNTER — Ambulatory Visit: Payer: Medicare PPO | Admitting: *Deleted

## 2022-01-26 DIAGNOSIS — J449 Chronic obstructive pulmonary disease, unspecified: Secondary | ICD-10-CM

## 2022-01-26 DIAGNOSIS — I10 Essential (primary) hypertension: Secondary | ICD-10-CM

## 2022-01-26 DIAGNOSIS — S22000S Wedge compression fracture of unspecified thoracic vertebra, sequela: Secondary | ICD-10-CM

## 2022-01-26 NOTE — Chronic Care Management (AMB) (Signed)
Chronic Care Management    Clinical Social Work Note  01/26/2022 Name: [name] MRN: [id number] DOB: 11-May-1931  [name] is a 88 y.o. year old female who is a primary care patient of Tonia Ghent, MD. The CCM team was consulted to assist the patient with chronic disease management and/or care coordination needs related to: [employer] , Level of Care Concerns, and Caregiver Stress.   Engaged with patient by telephone for follow up visit in response to provider referral for social work chronic care management and care coordination services.   Consent to Services:  The patient was given information about Chronic Care Management services, agreed to services, and gave verbal consent prior to initiation of services.  Please see initial visit note for detailed documentation.   Patient agreed to services and consent obtained.   Assessment: Review of patient past medical history, allergies, medications, and health status, including review of relevant consultants reports was performed today as part of a comprehensive evaluation and provision of chronic care management and care coordination services.     SDOH (Social Determinants of Health) assessments and interventions performed:    Advanced Directives Status: See Care Plan for related entries.  CCM Care Plan  Allergies  Allergen Reactions   Doxycycline Other (See Comments)    GI upset   Sulfadiazine Other (See Comments)    REACTION: Fever, aches   Sulfa Antibiotics Rash    Outpatient Encounter Medications as of 01/26/2022  Medication Sig   albuterol (PROAIR HFA) 108 (90 Base) MCG/ACT inhaler Inhale 1-2 puffs into the lungs every 6 (six) hours as needed for wheezing or shortness of breath (Okay to fill with Ventolin or albuterol HFA).   alendronate (FOSAMAX) 70 MG tablet Take 1 tablet (70 mg total) by mouth every Monday.   ALPRAZolam (XANAX) 0.25 MG tablet Take 1 tablet (0.25 mg total) by mouth 3  (three) times daily as needed for anxiety or sleep.   anastrozole (ARIMIDEX) 1 MG tablet Take 1 tablet (1 mg total) by mouth daily.   busPIRone (BUSPAR) 5 MG tablet TAKE ONE TABLET BY MOUTH IN THE MORNING AND IN THE EVENING   Calcium Carbonate (CALTRATE 600 PO) Take 600 mg by mouth daily.   cholecalciferol (VITAMIN D) 1000 units tablet Take 1 tablet (1,000 Units total) by mouth daily.   donepezil (ARICEPT) 5 MG tablet Take 1 tablet (5 mg total) by mouth at bedtime. (Patient taking differently: Take 5 mg by mouth at bedtime as needed (memory).)   DULoxetine (CYMBALTA) 60 MG capsule TAKE ONE CAPSULE BY MOUTH DAILY   ferrous sulfate (FERROUSUL) 325 (65 FE) MG tablet Take 1 tablet (325 mg total) by mouth daily with breakfast.   fluticasone (FLONASE) 50 MCG/ACT nasal spray PLACE 2 SPRAYS INTO BOTH NOSTRILS DAILY. (Patient taking differently: Place 2 sprays into both nostrils daily as needed for allergies.)   HYDROcodone-acetaminophen (NORCO) 10-325 MG tablet Take 0.5-1 tablets by mouth every 6 (six) hours as needed.   ipratropium-albuterol (DUONEB) 0.5-2.5 (3) MG/3ML SOLN Take 3 mLs by nebulization 2 (two) times daily.   levothyroxine (SYNTHROID) 112 MCG tablet TAKE ONE TABLET BY MOUTH DAILY   methocarbamol (ROBAXIN) 500 MG tablet Take 1 tablet (500 mg total) by mouth every 6 (six) hours as needed for muscle spasms. Please contact clinic about follow up with palliative care   mometasone-formoterol (DULERA) 200-5 MCG/ACT AERO Inhale 2 puffs into the lungs 2 (two) times daily.   Multiple Vitamin (MULTIVITAMIN WITH MINERALS) TABS tablet  Take 1 tablet by mouth daily.   nitroGLYCERIN (NITROSTAT) 0.4 MG SL tablet Place 1 tablet (0.4 mg total) under the tongue every 5 (five) minutes as needed for chest pain.   OXYGEN Inhale 3-3.5 L into the lungs continuous.   pantoprazole (PROTONIX) 20 MG tablet TAKE ONE TABLET BY MOUTH TWICE DAILY   PERCOCET 5-325 MG tablet Take 0.5 tablets by mouth 3 (three) times daily.    polyethylene glycol (MIRALAX / GLYCOLAX) 17 g packet Take 17 g by mouth daily as needed for moderate constipation.   rosuvastatin (CRESTOR) 10 MG tablet Take 1 tablet (10 mg total) by mouth daily.   senna-docusate (SENOKOT-S) 8.6-50 MG tablet Take 1 tablet by mouth at bedtime.   SPIRIVA HANDIHALER 18 MCG inhalation capsule INHALE THE CONTENTS OF ONE CAPSULE VIA HANDIHALER DAILY (Patient taking differently: 18 mcg daily as needed (sob).)   [DISCONTINUED] bisoprolol (ZEBETA) 5 MG tablet Take 0.5 tablets (2.5 mg total) by mouth daily.   No facility-administered encounter medications on file as of 01/26/2022.    Patient Active Problem List   Diagnosis Date Noted   Acute exacerbation of chronic obstructive pulmonary disease (COPD) ([place]) 24/23/5361   Acute metabolic encephalopathy 44/31/5400   Ground-level fall 12/06/2021   Breast cancer ([place])    Chronic systolic CHF (congestive heart failure) ([place])    Syncope 09/25/2021   Generalized weakness 07/07/2020   Decreased appetite 09/20/2019   Lower GI bleeding 09/09/2019   DNR (do not resuscitate) 09/08/2019   Chronic anemia 09/07/2019   Macrocytic anemia 09/07/2019   Jerking 09/07/2019   Fall at home, initial encounter 09/07/2019   History of CVA (cerebrovascular accident) 09/07/2019   History of breast cancer 09/07/2019   Pelvic pain 03/25/2019   Dysphagia 02/18/2019   Coronary artery disease involving native coronary artery of native heart with unstable angina pectoris ([place]) 01/30/2019   Presence of drug coated stent in right coronary artery 01/30/2019   Left main coronary artery disease - ~50-60% dLM (&70% dLAD) -- Plan Med Rx 01/30/2019   Ischemic cardiomyopathy 01/30/2019   Acute ST elevation myocardial infarction (STEMI) of inferior wall ([place]) 01/29/2019   AAA (abdominal aortic aneurysm) ([place]) 12/31/2018   Malignant neoplasm of upper-outer quadrant of right breast in female, estrogen receptor positive ([place]) 09/26/2018   Breast mass  08/28/2018   Bilateral impacted cerumen 08/14/2017   Laryngopharyngeal reflux (LPR) 08/14/2017   Presbycusis of both ears 08/14/2017   Thoracic aortic aneurysm 04/10/2017   Abdominal pain 04/05/2017   Low back pain 01/01/2017   Medicare annual wellness visit, subsequent 09/26/2016   Hard of hearing 09/26/2016   Vitamin D deficiency 09/26/2016   Advance care planning 09/26/2016   Encounter for chronic pain management 07/26/2016   Edema 10/21/2014   Multinodular thyroid 07/07/2013   COPD (chronic obstructive pulmonary disease) ([place]) 06/09/2013   HTN (hypertension) 06/09/2013   Chronic respiratory failure with hypoxia ([place]) 05/13/2013   Cough 05/02/2013   Vertigo 12/31/2012   Acute blood loss anemia 12/13/2011   Osteoarthritis of right knee 08/27/2011   Gold C Copd with asthmatic bronchitis component and chronic resp failure 06/26/2011   Neck pain 04/02/2011   RISK OF FALLING 08/17/2010   Hypokalemia 05/24/2009   Anemia, unspecified 04/10/2008   Anxiety 03/01/2008   Osteoporosis 06/23/2007   Chronic pain syndrome 12/26/2006   Acquired hypothyroidism 12/24/2006   Hyperlipidemia with target LDL less than 70 12/24/2006   COMMON MIGRAINE 12/24/2006   GERD 12/24/2006   DEGENERATIVE [place] DISEASE 12/24/2006  Fibromyalgia 12/24/2006   Insomnia 12/24/2006   HYPERGLYCEMIA 12/24/2006    Conditions to be addressed/monitored: Osteoarthritis; Level of care concerns, Limited access to caregiver, and Lacks knowledge of community resource:    Care Plan : LCSW Plan of Care  Updates made by Deirdre Peer, LCSW since 01/26/2022 12:00 AM     Problem: Social and Functional Symptoms   Priority: High  Note:   Current barriers:    Level of care concerns, ADL IADL limitations, Limited access to caregiver, Cognitive Deficits, Inability to perform ADL's independently, and Lacks knowledge of community resource:   Clinical Goals: Patient will work with team  to address needs related to  caregiving, planning, etc Clinical Interventions:  01/26/22- CSW spoke with Christianne Borrow, by phone who reports she is awaiting call back from the planner to schedule the kyphoplasty. Per daughter, HH has begun and appears to be minimal with once weekly sessions thus far from PT and OT.   Assessment of needs, barriers , agencies contacted, as well as how impacting  Solution-Focused Strategies employed:  Active listening / Reflection utilized  Problem [place] strategies reviewed Caregiver stress acknowledged  Consideration of in-home help encouraged : options discussed Review various resources, discussed options and provided patient information about  Referral to care guide ( )  1:1 collaboration with primary care provider regarding development and update of comprehensive plan of care as evidenced by provider attestation and co-signature Inter-disciplinary care team collaboration (see longitudinal plan of care) Patient Goals/Self-Care Activities: Over the next 30 days -continue with in-home private duty caregiver/companion as needed -continue with Home Health agency  PT/OT etc -review [employer] and Attendants program for eligibility - begin a notebook of services in my neighborhood or community - call 211 when I need some help - follow-up on any referrals for help I am given - think ahead to make sure my need does not become an emergency - make a note about what I need to have by the phone or take with me, like an identification card or social security number have a back-up plan - have a back-up plan - make a list of family or friends that I can call           Follow Up Plan: Appointment scheduled for SW follow up with client by phone on: 02/02/22      Eduard Clos MSW, [place] Licensed Clinical Social Worker [place]   [phone]

## 2022-01-26 NOTE — Patient Instructions (Signed)
Visit Information  Thank you for taking time to visit with me today. Please don't hesitate to contact me if I can be of assistance to you before our next scheduled telephone appointment.  Following are the goals we discussed today:  -continue with in-home private duty caregiver/companion as needed -continue with Home Health agency  PT/OT etc -review [place] and Attendants program for eligibility - begin a notebook of services in my neighborhood or community - call 211 when I need some help - follow-up on any referrals for help I am given - think ahead to make sure my need does not become an emergency - make a note about what I need to have by the phone or take with me, like an identification card or social security number have a back-up plan - have a back-up plan - make a list of family or friends that I can call   Our next appointment is by telephone on 02/02/22    Please call the care guide team at [phone] if you need to cancel or reschedule your appointment.   If you are experiencing a Mental Health or [place] or need someone to talk to, please call 911   Patient verbalizes understanding of instructions and care plan provided today and agrees to view in [place]. Active MyChart status and patient understanding of how to access instructions and care plan via MyChart confirmed with patient.     Eduard Clos MSW, LCSW Licensed Clinical Social Worker [place]   [phone]

## 2022-01-28 ENCOUNTER — Emergency Department (HOSPITAL_COMMUNITY)
Admission: EM | Admit: 2022-01-28 | Discharge: 2022-01-28 | Discharge status: Against Medical Advice | Payer: Medicare PPO | Attending: Emergency Medicine | Admitting: Emergency Medicine

## 2022-01-28 ENCOUNTER — Emergency Department (HOSPITAL_COMMUNITY): Payer: Medicare PPO

## 2022-01-28 ENCOUNTER — Other Ambulatory Visit: Payer: Self-pay

## 2022-01-28 DIAGNOSIS — W19XXXA Unspecified fall, initial encounter: Secondary | ICD-10-CM | POA: Diagnosis not present

## 2022-01-28 DIAGNOSIS — R059 Cough, unspecified: Secondary | ICD-10-CM | POA: Diagnosis not present

## 2022-01-28 DIAGNOSIS — Z5329 Procedure and treatment not carried out because of patient's decision for other reasons: Secondary | ICD-10-CM | POA: Insufficient documentation

## 2022-01-28 DIAGNOSIS — I491 Atrial premature depolarization: Secondary | ICD-10-CM | POA: Diagnosis not present

## 2022-01-28 DIAGNOSIS — R0902 Hypoxemia: Secondary | ICD-10-CM | POA: Diagnosis not present

## 2022-01-28 DIAGNOSIS — R531 Weakness: Secondary | ICD-10-CM | POA: Insufficient documentation

## 2022-01-28 LAB — BASIC METABOLIC PANEL
Anion gap: 6 (ref 5–15)
BUN: 12 mg/dL (ref 8–23)
CO2: 41 mmol/L — ABNORMAL HIGH (ref 22–32)
Calcium: 8.9 mg/dL (ref 8.9–10.3)
Chloride: 96 mmol/L — ABNORMAL LOW (ref 98–111)
Creatinine, Ser: 0.54 mg/dL (ref 0.44–1.00)
GFR, Estimated: >60 mL/min (ref >60)
Glucose, Bld: 113 mg/dL — ABNORMAL HIGH (ref 70–99)
Potassium: 3.5 mmol/L (ref 3.5–5.1)
Sodium: 143 mmol/L (ref 135–145)

## 2022-01-28 LAB — CBC
HCT: 34.7 % — ABNORMAL LOW (ref 36.0–46.0)
Hemoglobin: 10.7 g/dL — ABNORMAL LOW (ref 12.0–15.0)
MCH: 32.5 pg (ref 26.0–34.0)
MCHC: 30.8 g/dL (ref 30.0–36.0)
MCV: 105.5 fL — ABNORMAL HIGH (ref 80.0–100.0)
Platelets: 153 10*3/uL (ref 150–400)
RBC: 3.29 MIL/uL — ABNORMAL LOW (ref 3.87–5.11)
RDW: 14 % (ref 11.5–15.5)
WBC: 5.6 10*3/uL (ref 4.0–10.5)
nRBC: 0 % (ref 0.0–0.2)

## 2022-01-28 LAB — URINALYSIS, ROUTINE W REFLEX MICROSCOPIC
Bilirubin Urine: NEGATIVE
Glucose, UA: NEGATIVE mg/dL
Hgb urine dipstick: NEGATIVE
Ketones, ur: 20 mg/dL — AB
Leukocytes,Ua: NEGATIVE
Nitrite: NEGATIVE
Protein, ur: NEGATIVE mg/dL
Specific Gravity, Urine: 1.023 (ref 1.005–1.030)
pH: 6 (ref 5.0–8.0)

## 2022-01-28 LAB — CBG MONITORING, ED: Glucose-Capillary: 113 mg/dL — ABNORMAL HIGH (ref 70–99)

## 2022-01-28 LAB — TROPONIN I (HIGH SENSITIVITY)
Troponin I (High Sensitivity): 17 ng/L (ref <18)
Troponin I (High Sensitivity): 17 ng/L (ref <18)

## 2022-01-28 NOTE — ED Notes (Signed)
Daughter did not bring pt's portable O2 tank. Daughter educated that pt should not go home w/o O2 for the drive. Was asked to go to house to retrieve it, but refused. MD notified. Pt told it would be AMA if they took their mother home w/o O2. Educated about risks including hypoxic brain injury. Daughter signed AMA form.

## 2022-01-28 NOTE — ED Provider Notes (Signed)
Chest x-ray [facility] DEPT Provider Note   CSN: [id number] Arrival date & time: 01/28/22  1154     History  Chief Complaint  Patient presents with   Weakness    [name] is a 88 y.o. female.  Patient presents ER chief complaint of generalized weakness.  She states that today she could not quite get up out of bed.  She felt like her legs gave out and her daughter caught her and laid her back in bed.  Patient states that she felt too weak to get out of bed today.  Complaining of generalized whole body pain which appears unchanged today per patient.  No new reports of any fevers.  No vomiting or diarrhea.  Patient states she does have a cough and has had 1 for several days now.  She is on 3 L nasal cannula which is her baseline oxygen requirement.  Denies any chest pain or headache or abdominal pain specifically.       Home Medications Prior to Admission medications   Medication Sig Start Date End Date Taking? Authorizing Provider  albuterol (PROAIR HFA) 108 (90 Base) MCG/ACT inhaler Inhale 1-2 puffs into the lungs every 6 (six) hours as needed for wheezing or shortness of breath (Okay to fill with Ventolin or albuterol HFA). 10/30/19   Tonia Ghent, MD  alendronate (FOSAMAX) 70 MG tablet Take 1 tablet (70 mg total) by mouth every Monday. 10/02/21   Benay Pike, MD  ALPRAZolam Duanne Moron) 0.25 MG tablet Take 1 tablet (0.25 mg total) by mouth 3 (three) times daily as needed for anxiety or sleep. 12/11/21   Aline August, MD  anastrozole (ARIMIDEX) 1 MG tablet Take 1 tablet (1 mg total) by mouth daily. 10/05/20   Magrinat, Virgie Dad, MD  busPIRone (BUSPAR) 5 MG tablet TAKE ONE TABLET BY MOUTH IN THE MORNING AND IN THE EVENING 01/18/22   Tonia Ghent, MD  Calcium Carbonate (CALTRATE 600 PO) Take 600 mg by mouth daily.    [provider]  cholecalciferol (VITAMIN D) 1000 units tablet Take 1 tablet (1,000 Units total) by mouth daily.  09/25/16   Tonia Ghent, MD  donepezil (ARICEPT) 5 MG tablet Take 1 tablet (5 mg total) by mouth at bedtime. Patient taking differently: Take 5 mg by mouth at bedtime as needed (memory). 11/28/21   Tonia Ghent, MD  DULoxetine (CYMBALTA) 60 MG capsule TAKE ONE CAPSULE BY MOUTH DAILY 12/22/21   Tonia Ghent, MD  ferrous sulfate (FERROUSUL) 325 (65 FE) MG tablet Take 1 tablet (325 mg total) by mouth daily with breakfast. 08/22/18   Tonia Ghent, MD  fluticasone (FLONASE) 50 MCG/ACT nasal spray PLACE 2 SPRAYS INTO BOTH NOSTRILS DAILY. Patient taking differently: Place 2 sprays into both nostrils daily as needed for allergies. 01/28/20   Tonia Ghent, MD  HYDROcodone-acetaminophen [facility]) 10-325 MG tablet Take 0.5-1 tablets by mouth every 6 (six) hours as needed. 01/18/22   Tonia Ghent, MD  ipratropium-albuterol (DUONEB) 0.5-2.5 (3) MG/3ML SOLN Take 3 mLs by nebulization 2 (two) times daily. 12/13/21   Florencia Reasons, MD  levothyroxine (SYNTHROID) 112 MCG tablet TAKE ONE TABLET BY MOUTH DAILY 11/08/21   Tonia Ghent, MD  methocarbamol (ROBAXIN) 500 MG tablet Take 1 tablet (500 mg total) by mouth every 6 (six) hours as needed for muscle spasms. Please contact clinic about follow up with palliative care 05/24/21   Tonia Ghent, MD  mometasone-formoterol [facility]) 200-5 MCG/ACT  AERO Inhale 2 puffs into the lungs 2 (two) times daily. 12/13/21   Florencia Reasons, MD  Multiple Vitamin (MULTIVITAMIN WITH MINERALS) TABS tablet Take 1 tablet by mouth daily.    [provider]  nitroGLYCERIN (NITROSTAT) 0.4 MG SL tablet Place 1 tablet (0.4 mg total) under the tongue every 5 (five) minutes as needed for chest pain. 02/23/20   Belva Crome, MD  OXYGEN Inhale 3-3.5 L into the lungs continuous.    [provider]  pantoprazole (PROTONIX) 20 MG tablet TAKE ONE TABLET BY MOUTH TWICE DAILY 10/23/21   Tonia Ghent, MD  PERCOCET 5-325 MG tablet Take 0.5 tablets by mouth 3 (three) times daily.  12/15/21   [provider]  polyethylene glycol (MIRALAX / GLYCOLAX) 17 g packet Take 17 g by mouth daily as needed for moderate constipation. 12/13/21   Florencia Reasons, MD  rosuvastatin (CRESTOR) 10 MG tablet Take 1 tablet (10 mg total) by mouth daily. 10/23/21   Tonia Ghent, MD  senna-docusate (SENOKOT-S) 8.6-50 MG tablet Take 1 tablet by mouth at bedtime. 12/13/21   Florencia Reasons, MD  SPIRIVA HANDIHALER 18 MCG inhalation capsule INHALE THE CONTENTS OF ONE CAPSULE VIA HANDIHALER DAILY Patient taking differently: 18 mcg daily as needed (sob). 10/23/21   Tonia Ghent, MD  bisoprolol (ZEBETA) 5 MG tablet Take 0.5 tablets (2.5 mg total) by mouth daily. 01/31/19 09/18/19  Belva Crome, MD      Allergies    Doxycycline, Sulfadiazine, and Sulfa antibiotics    Review of Systems   Review of Systems  Constitutional:  Negative for fever.  HENT:  Negative for ear pain.   Eyes:  Negative for pain.  Respiratory:  Positive for cough.   Cardiovascular:  Negative for chest pain.  Gastrointestinal:  Negative for abdominal pain.  Genitourinary:  Negative for flank pain.  Musculoskeletal:  Negative for back pain.  Skin:  Negative for rash.    Physical Exam Updated Vital Signs BP (!) 112/47 (BP Location: Left Arm)   Pulse 80   Temp 98.3 F (36.8 C) (Oral)   Resp 15   Ht '5\' 1"'$  (1.549 m)   Wt 52.2 kg   SpO2 99%   BMI 21.73 kg/m  Physical Exam Constitutional:      General: She is not in acute distress.    Appearance: Normal appearance.  HENT:     Head: Normocephalic.     Nose: Nose normal.  Eyes:     Extraocular Movements: Extraocular movements intact.  Cardiovascular:     Rate and Rhythm: Normal rate.  Pulmonary:     Effort: Pulmonary effort is normal.  Musculoskeletal:        General: Normal range of motion.     Cervical back: Normal range of motion.  Neurological:     General: No focal deficit present.     Mental Status: She is alert. Mental status is at baseline.     Cranial  Nerves: No cranial nerve deficit.     Motor: No weakness.     Comments: Patient has 5/5 strength all extremities.     ED Results / Procedures / Treatments   Labs (all labs ordered are listed, but only abnormal results are displayed) Labs Reviewed  BASIC METABOLIC PANEL - Abnormal; Notable for the following components:      Result Value   Chloride 96 (*)    CO2 41 (*)    Glucose, Bld 113 (*)    All other components  within normal limits  CBC - Abnormal; Notable for the following components:   RBC 3.29 (*)    Hemoglobin 10.7 (*)    HCT 34.7 (*)    MCV 105.5 (*)    All other components within normal limits  URINALYSIS, ROUTINE W REFLEX MICROSCOPIC - Abnormal; Notable for the following components:   Ketones, ur 20 (*)    All other components within normal limits  CBG MONITORING, ED - Abnormal; Notable for the following components:   Glucose-Capillary 113 (*)    All other components within normal limits  CBG MONITORING, ED  TROPONIN I (HIGH SENSITIVITY)  TROPONIN I (HIGH SENSITIVITY)    EKG EKG Interpretation  Date/Time:  Sunday January 28 2022 12:04:21 EDT Ventricular Rate:  83 PR Interval:  180 QRS Duration: 87 QT Interval:  402 QTC Calculation: 473 R Axis:   -1 Text Interpretation: Sinus rhythm Atrial premature complex Anterior infarct, old Confirmed by Thamas Jaegers ([id number]) on 01/28/2022 1:42:50 PM  Radiology DG Chest Port 1 View  Result Date: 01/28/2022 CLINICAL DATA:  Generalized weakness 1 day. Cough. Fall while getting out of bed. EXAM: PORTABLE CHEST 1 VIEW COMPARISON:  12/06/2021 FINDINGS: Lungs are adequately inflated with mild linear density right base likely atelectasis. No effusion or pneumothorax. Cardiomediastinal silhouette and remainder of the exam is unchanged. IMPRESSION: Mild linear density right base likely atelectasis. Electronically Signed   By: Marin Olp M.D.   On: 01/28/2022 13:39    Procedures Procedures    Medications Ordered in  ED Medications - No data to display  ED Course/ Medical Decision Making/ A&P                           Medical Decision Making Amount and/or Complexity of Data Reviewed Labs: ordered. Radiology: ordered.   Review of records shows office visit January 05, 2022 for infrarenal abdominal aortic aneurysm evaluation with vascular surgery.  Cardiac monitoring showing sinus rhythm.  Diagnosis is present.  White count 5 hemoglobin 10.7 chemistry shows moderately elevated bicarb.  Otherwise unremarkable labs.  Showing signs of atelectasis.  EKG shows sinus rhythm no ST elevations or depressions noted.  Patient continues have no focal neurodeficit.  She complains of pain "all over my whole body."  Recommending continue hydrocodone that she has at home as needed for pain at home.  I have also sent a message to her surgeon regarding the patient's questions regarding her pending kyphoplasty.  Recommending immediate return if she has fevers worsening symptoms new pains or any additional concerns.          Final Clinical Impression(s) / ED Diagnoses Final diagnoses:  Generalized weakness    Rx / DC Orders ED Discharge Orders     None         Luna Fuse, MD 01/28/22 [phone]

## 2022-01-28 NOTE — ED Triage Notes (Signed)
Pt fell back onto their mattress per EMS

## 2022-01-28 NOTE — ED Triage Notes (Signed)
Per EMS, patient from home, c/o generalized weakness x1 day. Caught by daughter after fall while getting out of bed.   BP 146/80 HR 78 94% 3L [place] CBG 135  20g L AC

## 2022-01-28 NOTE — Discharge Instructions (Signed)
I have sent a message to your spine specialist to follow-up with you regarding your pending procedure.  Follow-up with your primary care doctor this week.  Return to the ER if you have fevers worsening pain new symptoms or any additional concerns.

## 2022-01-29 ENCOUNTER — Telehealth: Payer: Self-pay | Admitting: Specialist

## 2022-01-29 ENCOUNTER — Ambulatory Visit: Payer: Medicare PPO | Admitting: Pharmacist

## 2022-01-29 ENCOUNTER — Telehealth: Payer: Self-pay | Admitting: Family Medicine

## 2022-01-29 DIAGNOSIS — J449 Chronic obstructive pulmonary disease, unspecified: Secondary | ICD-10-CM

## 2022-01-29 DIAGNOSIS — M810 Age-related osteoporosis without current pathological fracture: Secondary | ICD-10-CM

## 2022-01-29 DIAGNOSIS — I10 Essential (primary) hypertension: Secondary | ICD-10-CM

## 2022-01-29 DIAGNOSIS — E039 Hypothyroidism, unspecified: Secondary | ICD-10-CM

## 2022-01-29 DIAGNOSIS — E785 Hyperlipidemia, unspecified: Secondary | ICD-10-CM

## 2022-01-29 DIAGNOSIS — G894 Chronic pain syndrome: Secondary | ICD-10-CM

## 2022-01-29 NOTE — Telephone Encounter (Signed)
[name] with [facility] radiology called on 01/25/22 that has been waiting on CT TSP to be done before can schedule Kyphoplasty with Deveshwar. [facility] might need clinicals- she will call back to let us know if need to do anything.

## 2022-01-29 NOTE — Telephone Encounter (Signed)
[name] from [facility] called in to let you know that medication Bonepezil '5mg'$  interacts with medication Trazozodone '100mg'$  for this patient.

## 2022-01-29 NOTE — Telephone Encounter (Signed)
Patient's daughter [name] called asked for a call back from [facility] as soon as possible reference referral.  The number to contact [name] is  [phone]

## 2022-01-29 NOTE — Progress Notes (Signed)
Chronic Care Management Pharmacy Note  02/05/2022 Name:  [name] MRN:  [id number] DOB:  March 27, 1931  Summary: CCM F/U visit -Family has concerns about medication management in the home - currently they are using [facility] ([place] based out of [place], not preferred with insurance plan); Discussed benefits of medication synchronization, packaging and delivery as well as enhanced pharmacist oversight with [facility]. Family to discuss optimizing pharmacy with patient and will contact PharmD if they wish to proceed.  Recommendations/Changes made from today's visit: -No med changes  Plan: -[place] will call patient within 2 weeks to check on pharmacy status -Pharmacist follow up televisit scheduled for 2 months -PCP 02/20/22    Subjective: [name] is an 88 y.o. year old female who is a primary patient of Damita Dunnings, Elveria Rising, MD.  The CCM team was consulted for assistance with disease management and care coordination needs.    Engaged with patient by telephone for follow up visit in response to provider referral for pharmacy case management and/or care coordination services.   Consent to Services:  The patient was given information about Chronic Care Management services, agreed to services, and gave verbal consent prior to initiation of services.  Please see initial visit note for detailed documentation.   Patient Care Team: Tonia Ghent, MD as PCP - General Tamala Julian Lynnell Dike, MD as PCP - Cardiology (Cardiology) Magrinat, Virgie Dad, MD (Inactive) as Consulting Physician (Oncology) [name], MD as Consulting Physician (Otolaryngology) Marshell Garfinkel, MD as Consulting Physician (Pulmonary Disease) Deirdre Peer, LCSW as Social Worker Charlton Haws, [facility] as Pharmacist (Pharmacist)  Recent office visits: 11/02/21 Dr Damita Dunnings OV: hospital f/u - decrease levothyroxine to 112 mcg.  Recent consult visits: 01/05/22 Dr Trula Slade  (Vasc Surg): new pt - AAA. Not a good surgical candidate. Rec 1 year f/u. Ok for kyphoplasty.  12/28/21 Dr Louanne Skye (Ortho surg): compression fracture; ordered repeat CT. Repeat BMD later. Increase hydrocodone to 10 mg q4-6hr. RTC 2 weeks.Marland Kitchen  Hospital visits: 01/28/22 ED visit ([facility]): generalized weakness - f/u with PCP.  12/21/21 ED visit ([facility]): compression fracture - f/u with neurosurgery 1 week.  12/06/21 - 12/13/21 admission ([facility]): COPD exacerbation and AMS. Reduced Xanax dose. PT rec'd SNF.  09/25/21 - 09/27/21 admission [facility]) : found down w/ AMS. No acute findings. Rec neuropsych eval outpatient. Rec ILF/ALF environment.  08/30/21 ED visit [facility]): fall - CT head WNL.    Objective:  Lab Results  Component Value Date   CREATININE 0.54 01/28/2022   BUN 12 01/28/2022   GFR 81.66 11/02/2021   GFRNONAA >60 01/28/2022   GFRAA >60 09/14/2019   NA 143 01/28/2022   K 3.5 01/28/2022   CALCIUM 8.9 01/28/2022   CO2 41 (H) 01/28/2022   GLUCOSE 113 (H) 01/28/2022    Lab Results  Component Value Date/Time   HGBA1C 5.6 01/30/2019 05:39 AM   HGBA1C 5.6 01/29/2019 12:52 PM   GFR 81.66 11/02/2021 03:57 PM   GFR 80.86 11/03/2020 12:50 PM   MICROALBUR 0.4 05/24/2009 09:36 AM   MICROALBUR 1.3 11/26/2008 09:27 AM    Last diabetic Eye exam: No results found for: "HMDIABEYEEXA"  Last diabetic Foot exam: No results found for: "HMDIABFOOTEX"   Lab Results  Component Value Date   CHOL 125 11/02/2021   HDL 52.00 11/02/2021   LDLCALC 60 11/02/2021   LDLDIRECT 157.9 03/12/2007   TRIG 65.0 11/02/2021   CHOLHDL 2 11/02/2021       Latest Ref Rng &  Units 12/07/2021    2:49 AM 12/06/2021    4:51 PM 11/02/2021    3:57 PM  Hepatic Function  Total Protein 6.5 - 8.1 g/dL 6.6  6.7  6.8   Albumin 3.5 - 5.0 g/dL 3.3  3.5  4.0   AST 15 - 41 U/L '22  22  22   ' ALT 0 - 44 U/L '12  11  7   ' Alk Phosphatase 38 - 126 U/L 36  36  40   Total Bilirubin 0.3 - 1.2 mg/dL 0.6  1.0  0.4     Lab Results  Component Value  Date/Time   TSH 0.067 (L) 12/06/2021 08:45 PM   TSH 0.29 (L) 11/02/2021 03:57 PM   TSH 10.31 (H) 11/03/2020 12:50 PM   FREET4 0.80 03/02/2013 11:52 AM   FREET4 0.8 05/24/2009 09:36 AM       Latest Ref Rng & Units 01/28/2022   12:11 PM 12/10/2021    3:23 AM 12/07/2021    2:49 AM  CBC  WBC 4.0 - 10.5 K/uL 5.6  4.5  4.0   Hemoglobin 12.0 - 15.0 g/dL 10.7  9.9  10.5   Hematocrit 36.0 - 46.0 % 34.7  31.7  34.4   Platelets 150 - 400 K/uL 153  147  126     Iron/TIBC/Ferritin/ %Sat    Component Value Date/Time   IRON 56 07/10/2020 0335   TIBC 297 07/10/2020 0335   FERRITIN 32 07/11/2020 0416   IRONPCTSAT 19 07/10/2020 0335   IRONPCTSAT 12 (L) 08/22/2018 1625    Lab Results  Component Value Date/Time   VD25OH 32.12 11/02/2021 03:57 PM   VD25OH 31.08 11/03/2020 12:50 PM    Clinical ASCVD: Yes  The ASCVD Risk score (Arnett DK, et al., 2019) failed to calculate for the following reasons:   The 2019 ASCVD risk score is only valid for ages 40 to 13   The patient has a prior MI or stroke diagnosis       11/02/2021    3:06 PM 07/19/2020    2:44 PM 07/19/2020   10:41 AM  Depression screen PHQ 2/9  Decreased Interest 0 0 0  Down, Depressed, Hopeless 0 0 1  PHQ - 2 Score 0 0 1      Social History   Tobacco Use  Smoking Status Former   Packs/day: 1.50   Years: 10.00   Total pack years: 15.00   Types: Cigarettes   Quit date: 07/09/1985   Years since quitting: 36.6  Smokeless Tobacco Never  Tobacco Comments   1 1/2 ppd x 20 years   BP Readings from Last 3 Encounters:  01/28/22 (!) 112/47  01/05/22 (!) 86/58  12/28/21 121/75   Pulse Readings from Last 3 Encounters:  01/28/22 80  01/05/22 94  12/28/21 (!) 106   Wt Readings from Last 3 Encounters:  01/28/22 115 lb (52.2 kg)  01/05/22 125 lb (56.7 kg)  12/28/21 122 lb (55.3 kg)   BMI Readings from Last 3 Encounters:  01/28/22 21.73 kg/m  01/05/22 23.62 kg/m  12/28/21 23.83 kg/m    Assessment/Interventions:  Review of patient past medical history, allergies, medications, health status, including review of consultants reports, laboratory and other test data, was performed as part of comprehensive evaluation and provision of chronic care management services.   SDOH:  (Social Determinants of Health) assessments and interventions performed: No - done June 2023  SDOH Screenings   Alcohol Screen: Not on file  Depression (UJW1-1): Low Risk  (  11/02/2021)   Depression (PHQ2-9)    PHQ-2 Score: 0  Financial Resource Strain: High Risk (04/11/2021)   Overall Financial Resource Strain (CARDIA)    Difficulty of Paying Living Expenses: Hard  Food Insecurity: No Food Insecurity (05/17/2021)   Hunger Vital Sign    Worried About Running Out of Food in the Last Year: Never true    Ran Out of Food in the Last Year: Never true  Housing: Not on file  Physical Activity: Inactive (01/13/2022)   Exercise Vital Sign    Days of Exercise per Week: 0 days    Minutes of Exercise per Session: 0 min  Social Connections: Moderately Isolated (05/17/2021)   Social Connection and Isolation Panel [NHANES]    Frequency of Communication with Friends and Family: More than three times a week    Frequency of Social Gatherings with Friends and Family: Once a week    Attends Religious Services: 1 to 4 times per year    Active Member of [employer] or Organizations: No    Attends Archivist Meetings: Never    Marital Status: Widowed  Stress: Not on file  Tobacco Use: Medium Risk (01/05/2022)   Patient History    Smoking Tobacco Use: Former    Smokeless Tobacco Use: Never    Passive Exposure: Not on file  Transportation Needs: No Transportation Needs (01/13/2022)   PRAPARE - Hydrologist (Medical): No    Lack of Transportation (Non-Medical): No    CCM Care Plan  Allergies  Allergen Reactions   Doxycycline Other (See Comments)    GI upset   Sulfadiazine Other (See Comments)    REACTION: Fever,  aches   Sulfa Antibiotics Rash    Medications Reviewed Today     Reviewed by Charlton Haws, [facility] (Pharmacist) on 02/05/22 at 1334  Med List Status: <None>   Medication Order Taking? Sig Documenting Provider Last Dose Status Informant  albuterol (PROAIR HFA) 108 (90 Base) MCG/ACT inhaler [id number] Yes Inhale 1-2 puffs into the lungs every 6 (six) hours as needed for wheezing or shortness of breath (Okay to fill with Ventolin or albuterol HFA). Tonia Ghent, MD Taking Active Child  alendronate (FOSAMAX) 70 MG tablet [id number] Yes Take 1 tablet (70 mg total) by mouth every Monday. Benay Pike, MD Taking Active Child  ALPRAZolam Duanne Moron) 0.25 MG tablet [id number] Yes Take 1 tablet (0.25 mg total) by mouth 3 (three) times daily as needed for anxiety or sleep. Aline August, MD Taking Active   anastrozole (ARIMIDEX) 1 MG tablet [id number] Yes Take 1 tablet (1 mg total) by mouth daily. Magrinat, Virgie Dad, MD Taking Active Child    Discontinued 09/14/19 2204 busPIRone (BUSPAR) 5 MG tablet [id number] Yes TAKE ONE TABLET BY MOUTH IN THE MORNING AND IN THE Kalman Jewels, MD Taking Active   Calcium Carbonate (CALTRATE 600 PO) [id number] Yes Take 600 mg by mouth daily. [provider] Taking Active Child  cholecalciferol (VITAMIN D) 1000 units tablet [id number] Yes Take 1 tablet (1,000 Units total) by mouth daily. Tonia Ghent, MD Taking Active Child  donepezil (ARICEPT) 5 MG tablet [id number] Yes Take 1 tablet (5 mg total) by mouth at bedtime.  Patient taking differently: Take 5 mg by mouth at bedtime as needed (memory).   Tonia Ghent, MD Taking Active Child  DULoxetine (CYMBALTA) 60 MG capsule [id number] Yes TAKE ONE CAPSULE BY MOUTH DAILY Tonia Ghent, MD Taking Active   ferrous sulfate [facility])  325 (65 FE) MG tablet [id number] Yes Take 1 tablet (325 mg total) by mouth daily with breakfast. Tonia Ghent, MD Taking Active Child  fluticasone [facility]) 50 MCG/ACT  nasal spray [id number] Yes PLACE 2 SPRAYS INTO BOTH NOSTRILS DAILY.  Patient taking differently: Place 2 sprays into both nostrils daily as needed for allergies.   Tonia Ghent, MD Taking Active Child  HYDROcodone-acetaminophen [facility]) 10-325 MG tablet [id number] Yes Take 0.5-1 tablets by mouth every 6 (six) hours as needed. Tonia Ghent, MD Taking Active   ipratropium-albuterol (DUONEB) 0.5-2.5 (3) MG/3ML SOLN [id number] Yes Take 3 mLs by nebulization 2 (two) times daily. Florencia Reasons, MD Taking Active   levothyroxine (SYNTHROID) 112 MCG tablet [id number] Yes TAKE ONE TABLET BY MOUTH DAILY Tonia Ghent, MD Taking Active Child  methocarbamol (ROBAXIN) 500 MG tablet [id number] Yes Take 1 tablet (500 mg total) by mouth every 6 (six) hours as needed for muscle spasms. Please contact clinic about follow up with palliative care Tonia Ghent, MD Taking Active Child  mometasone-formoterol [facility]) 200-5 MCG/ACT Hollie Salk [id number] Yes Inhale 2 puffs into the lungs 2 (two) times daily. Florencia Reasons, MD Taking Active   Multiple Vitamin (MULTIVITAMIN WITH MINERALS) TABS tablet [id number] Yes Take 1 tablet by mouth daily. [provider] Taking Active Child  nitroGLYCERIN (NITROSTAT) 0.4 MG SL tablet [id number] Yes Place 1 tablet (0.4 mg total) under the tongue every 5 (five) minutes as needed for chest pain. Belva Crome, MD Taking Active Child  OXYGEN [id number] Yes Inhale 3-3.5 L into the lungs continuous. [provider] Taking Active Child  pantoprazole (PROTONIX) 20 MG tablet [id number] Yes TAKE ONE TABLET BY MOUTH TWICE DAILY Tonia Ghent, MD Taking Active Child  polyethylene glycol (MIRALAX / GLYCOLAX) 17 g packet [id number] Yes Take 17 g by mouth daily as needed for moderate constipation. Florencia Reasons, MD Taking Active   rosuvastatin (CRESTOR) 10 MG tablet [id number] Yes Take 1 tablet (10 mg total) by mouth daily. Tonia Ghent, MD Taking Active Child  senna-docusate (SENOKOT-S) 8.6-50 MG  tablet [id number] Yes Take 1 tablet by mouth at bedtime. Florencia Reasons, MD Taking Active   [facility] HANDIHALER 18 MCG inhalation capsule [id number] Yes INHALE THE CONTENTS OF ONE CAPSULE VIA [place]  Patient taking differently: 18 mcg daily as needed (sob).   Tonia Ghent, MD Taking Active Child  traZODone (DESYREL) 50 MG tablet [id number]  Take 2 tablets (100 mg total) by mouth at bedtime. Tonia Ghent, MD  Active   Med List Note Otilio Connors, CPhT 09/25/21 1816): [name] (Daughter) at [id number]             Patient Active Problem List   Diagnosis Date Noted   Acute exacerbation of chronic obstructive pulmonary disease (COPD) ([place]) 78/67/6720   Acute metabolic encephalopathy 94/70/9628   Ground-level fall 12/06/2021   Breast cancer ([place])    Chronic systolic CHF (congestive heart failure) ([place])    Syncope 09/25/2021   Generalized weakness 07/07/2020   Decreased appetite 09/20/2019   Lower GI bleeding 09/09/2019   DNR (do not resuscitate) 09/08/2019   Chronic anemia 09/07/2019   Macrocytic anemia 09/07/2019   Jerking 09/07/2019   Fall at home, initial encounter 09/07/2019   History of CVA (cerebrovascular accident) 09/07/2019   History of breast cancer 09/07/2019   Pelvic pain 03/25/2019   Dysphagia 02/18/2019   Coronary artery disease involving native coronary artery of native heart with unstable angina pectoris ([place]) 01/30/2019  Presence of drug coated stent in right coronary artery 01/30/2019   Left main coronary artery disease - ~50-60% dLM (&70% dLAD) -- Plan Med Rx 01/30/2019   Ischemic cardiomyopathy 01/30/2019   Acute ST elevation myocardial infarction (STEMI) of inferior wall ([place]) 01/29/2019   AAA (abdominal aortic aneurysm) ([place]) 12/31/2018   Malignant neoplasm of upper-outer quadrant of right breast in female, estrogen receptor positive ([place]) 09/26/2018   Breast mass 08/28/2018   Bilateral impacted cerumen 08/14/2017   Laryngopharyngeal reflux  (LPR) 08/14/2017   Presbycusis of both ears 08/14/2017   Thoracic aortic aneurysm 04/10/2017   Abdominal pain 04/05/2017   Low back pain 01/01/2017   Medicare annual wellness visit, subsequent 09/26/2016   Hard of hearing 09/26/2016   Vitamin D deficiency 09/26/2016   Advance care planning 09/26/2016   Encounter for chronic pain management 07/26/2016   Edema 10/21/2014   Multinodular thyroid 07/07/2013   COPD (chronic obstructive pulmonary disease) ([place]) 06/09/2013   HTN (hypertension) 06/09/2013   Chronic respiratory failure with hypoxia ([place]) 05/13/2013   Cough 05/02/2013   Vertigo 12/31/2012   Acute blood loss anemia 12/13/2011   Osteoarthritis of right knee 08/27/2011   Gold C Copd with asthmatic bronchitis component and chronic resp failure 06/26/2011   Neck pain 04/02/2011   RISK OF FALLING 08/17/2010   Hypokalemia 05/24/2009   Anemia, unspecified 04/10/2008   Anxiety 03/01/2008   Osteoporosis 06/23/2007   Chronic pain syndrome 12/26/2006   Acquired hypothyroidism 12/24/2006   Hyperlipidemia with target LDL less than 70 12/24/2006   COMMON MIGRAINE 12/24/2006   GERD 12/24/2006   DEGENERATIVE DISC DISEASE 12/24/2006   Fibromyalgia 12/24/2006   Insomnia 12/24/2006   HYPERGLYCEMIA 12/24/2006    Immunization History  Administered Date(s) Administered   Fluad Quad(high Dose 65+) 03/27/2019   Influenza Split 04/09/2011, 03/19/2012, 04/21/2013   Influenza Whole 04/08/2004, 05/07/2007, 03/11/2009, 04/21/2010   Influenza, High Dose Seasonal PF 04/24/2017   Influenza-Unspecified 04/20/2013, 04/07/2014, 05/05/2015, 04/08/2016   PFIZER(Purple Top)SARS-COV-2 Vaccination 07/28/2019, 08/18/2019   Pneumococcal Conjugate-13 08/23/2015   Pneumococcal Polysaccharide-23 07/09/1998   Td 04/19/2008   Zoster, Live 05/02/2015    Conditions to be addressed/monitored:  Hypertension, Hyperlipidemia, Heart Failure, Coronary Artery Disease, Hypothyroidism, Depression, and  Osteoporosis  Care Plan : General Pharmacy (Adult)  Updates made by Charlton Haws, [place] since 02/05/2022 12:00 AM     Problem: Hypertension, Hyperlipidemia, Heart Failure, Coronary Artery Disease, Hypothyroidism, Depression, and Osteoporosis      Long-Range Goal: Disease Management   Start Date: 04/11/2021  Expected End Date: 02/06/2023  This Visit's Progress: On track  Priority: High  Note:   Current Barriers:  Struggle to self-administer medication - requires family support  Pharmacist Clinical Goal(s):  Patient will achieve ability to self administer medications as prescribed through use of pill packs as evidenced by patient report through collaboration with PharmD and provider.   Interventions: 1:1 collaboration with Tonia Ghent, MD regarding development and update of comprehensive plan of care as evidenced by provider attestation and co-signature Inter-disciplinary care team collaboration (see longitudinal plan of care) Comprehensive medication review performed; medication list updated in electronic medical record  Hypertension / Heart Failure (BP goal <130/80) -Controlled - per clinic readings -Last ejection fraction: 40-45% (Date: 01/2019) -HF type: Moderately reduced EF; NYHA Class: not on file -Current treatment: None -Medications previously tried: amlodipine, furosemide, losartan,   -Current home readings: n/a -Denies hypotensive/hypertensive symptoms -Educated on BP goals and benefits of medications for prevention of heart attack, stroke and kidney  damage; -Counseled to monitor BP at home periodically -Recommended to continue current medication  Hyperlipidemia / CAD: (LDL goal < 70) -Query controlled - LDL 60 (10/2021) at goal, however pt has not been taking clopidgrel, there is some confusion as it was discontinued during June admission but discharge summary notes to continue Plavix; it appears it may have run out of refills and pt failed to make overdue  follow up appt with cardiology -Hx STEMI 01/2019 - DES PCI; hx AAA -Follows with cardiology (Dr Tamala Julian), overdue for f/u appt - last VV 02/03/20. No show for appt 01/03/22 (clearance for back surgery) -Current treatment: Rosuvastatin 10 mg daily - Appropriate, Effective, Safe, Accessible Clopidogrel 75 mg daily - not taking Nitroglycerin 0.4 mgSL prn - Appropriate, Effective, Safe, Accessible -Medications previously tried: n/a  -Educated on Cholesterol goals;  -Reviewed importance of clopidogrel, will consult with cardiology about restarting it -Recommended to continue current medication   COPD (Goal: control symptoms and prevent exacerbations) -Controlled -Gold Grade: Unknown; Current COPD Classification:  B (high sx, <2 exacerbations/yr) -MMRC/CAT score: not on file; Pulmonary function testing: not on file -Exacerbations requiring treatment in last 6 months: 1 -Current treatment  Albuterol HFA prn - Appropriate, Effective, Safe, Accessible Duoneb PRN - Appropriate, Effective, Safe, Accessible Dulera (mometasone-formoterol) 200-5 mcg/act 2 puff BID - Appropriate, Effective, Safe, Accessible Spiriva Handihaler 54mg 1 puff daily - Appropriate, Effective, Safe, Accessible Oxygen 3 L -Medications previously tried: n/a  -Patient reports consistent use of maintenance inhaler -Frequency of rescue inhaler use: PRN -Counseled on Proper inhaler technique; -Recommended to continue current medication  Depression/Anxiety (Goal: manage symptoms) -Controlled -PHQ9: 0 (10/2021) -GAD7: 0 (07/2020) -Connected with PCP for mental health support -Current treatment: Alprazolam 0.25 mg TID prn - Appropriate, Effective, Safe, Accessible Buspirone 5 mg BID - Appropriate, Effective, Safe, Accessible Duloxetine 60 mg daily - Appropriate, Effective, Safe, Accessible Trazodone 150 mg (Wynetta Emery - Appropriate, Effective, Safe, Accessible -Medications previously tried/failed: n/a -Educated on Benefits of  medication for symptom control -Recommended to continue current medication  Dementia (Goal: slow progression) -Controlled -Current treatment  Donepezil 5 mg HS - Appropriate, Effective, Safe, Accessible -Medications previously tried: n/a  -Recommended to continue current medication  Osteoporosis (Goal prevent fractures) -Query controlled - alendronate was started after most recent DEXA, no repeat DEXA has been performed yet -Last DEXA Scan: 02/2019   T-Score total hip: -4.4  T-Score forearm radius: -3.5 -Patient is a candidate for pharmacologic treatment due to history of vertebral fracture and T-Score < -2.5 in total hip  -Current treatment  Alendronate 70 mg weekly (started 10/2018) - Appropriate, Query Effective Calcium carbonate 600 mg - Appropriate, Effective, Safe, Accessible Vitamin D 1000 IU -Appropriate, Effective, Safe, Accessible -Medications previously tried: n/a  -Counseled on oral bisphosphonate administration: take in the morning, 30 minutes prior to food with 6-8 oz of water. Do not lie down for at least 30 minutes after taking. -Recommended to continue current medication  Hypothyroidism (Goal TSH: 0.4-4.5) -Query controlled- TSH low at last check (0.067 12/06/2021) when checked during hospitalization -Current treatment: Levothyroxine 112 mcg daily - Appropriate, Query Effective -Counseled to take medication n/a -Recommended repeat TSH   Chronic pain (Goal: manage pain) -Controlled -Follows with NP LEarmon Phoenix([facility]) -Current treatment  Hydrocodone-APAP 10-325 mg 1/2-1 tab q6h PRN - Appropriate, Effective, Safe, Accessible Methocarbamol 500 mg q6h PRN -Appropriate, Effective, Safe, Accessible Duloxetine 60 mg daily -Appropriate, Effective, Safe, Accessible Gabapentin 300 mg TID -Appropriate, Effective, Safe, Accessible Tylenol #3 (Wynetta Emery -  Appropriate, Effective, Safe, Accessible -Medications previously tried: n/a  -Recommended to  continue current medication  Hx Breast cancer (Goal: prevent recurrence) -Controlled -Dx 08/2018; last seen Oncology (Dr Jana Hakim) 09/2019 -Current treatment  Anastrozole 1 mg daily (started 03/2019) -Appropriate, Effective, Safe, Accessible -Medications previously tried: n/a  -Recommended to continue current medication  Medication management -Daughter [name] (lives local) manages medications for patient. 2nd daughter [name] is HCP[place] but lives in [place] is currently using [facility] (based in [place], [employer]), it is often difficult to coordinate med changes and delivery and it is not preferred with her insurance. Reviewed local pharmacy [facility] can package meds and deliver within a day, and is preferred. Family will discuss with patient switching pharmacies for improved provider oversight over medications  Health Maintenance -Vaccine gaps: Shingrix, TDAP -Hx Lower GI bleeding 09/2019 -Current therapy:  Ferrous sulfate 325 mg Flonase PRN Multivitamin Miralax Senna +Pantoprazole 20 mg daily  Patient Goals/Self-Care Activities Patient will:  - take medications as prescribed as evidenced by patient report and record review focus on medication adherence by pill packs - consider switching to local [facility] pharmacy       Medication Assistance: None required.  Patient affirms current coverage meets needs.  Compliance/Adherence/Medication fill history: Care Gaps: None  Star-Rating Drugs: Rosuvastatin - PDC 100%  Medication Access: Within the past 30 days, how often has patient missed a dose of medication? unknown Is a pillbox or other method used to improve adherence? Yes  Factors that may affect medication adherence? nonadherence to medications and memory impairment Are meds synced by current pharmacy? Yes  Are meds delivered by current pharmacy? Yes  Does patient experience delays in picking up medications due to transportation concerns? No   [facility]  Services Reviewed: Is patient disadvantaged to use [facility]?: No  Current Rx insurance plan: [employer] Name and location of Current pharmacy:  [facility] - [address] [place], [place] - [address] Kristeen Mans [address] Kristeen Mans [place] [address] Phone: [phone] Fax: [phone]  [facility] services reviewed with patient today?: Yes  Patient requests to transfer care to [facility]?: No  Reason patient declined to change pharmacies: Hesitance/Possibly interested in future   Care Plan and Follow Up Patient Decision:  Patient agrees to Care Plan and Follow-up.  Plan: Telephone follow up appointment with care management team member scheduled for:  2 months  Charlene Brooke, PharmD, [facility] [employer] Clinical Pharmacist [facility] [phone]

## 2022-01-30 ENCOUNTER — Telehealth (HOSPITAL_COMMUNITY): Payer: Self-pay

## 2022-01-30 MED ORDER — TRAZODONE HCL 50 MG PO TABS
100.0000 mg | ORAL_TABLET | Freq: Every day | ORAL | Status: DC
Start: 2022-01-30 — End: 2023-12-26

## 2022-01-30 NOTE — Telephone Encounter (Signed)
IC and spoke with [name] and discussed with her reason why they have not been contacted- gave number to [place] with IR dept [facility] so she can discuss what is needed.

## 2022-01-30 NOTE — Telephone Encounter (Signed)
Noted but if patient has tolerated both then she should be able to continue both as is.

## 2022-01-30 NOTE — Telephone Encounter (Signed)
Called pt regarding kyphoplasty referral. Pt will need to have her CT of spine done before insurance will review. Call to tell pt that GI has been trying to contact her to schedule. No answer, left vm for pt to call back.

## 2022-01-31 ENCOUNTER — Telehealth (HOSPITAL_COMMUNITY): Payer: Self-pay

## 2022-01-31 ENCOUNTER — Telehealth: Payer: Self-pay

## 2022-01-31 NOTE — Progress Notes (Signed)
Chronic Care Management Pharmacy Assistant   Name: [name]  MRN: [id number] DOB: 11/11/1930  Reason for Encounter: CCM (Hosptial Follow Up)  Medications: Outpatient Encounter Medications as of 01/31/2022  Medication Sig   albuterol (PROAIR HFA) 108 (90 Base) MCG/ACT inhaler Inhale 1-2 puffs into the lungs every 6 (six) hours as needed for wheezing or shortness of breath (Okay to fill with Ventolin or albuterol HFA).   alendronate (FOSAMAX) 70 MG tablet Take 1 tablet (70 mg total) by mouth every Monday.   ALPRAZolam (XANAX) 0.25 MG tablet Take 1 tablet (0.25 mg total) by mouth 3 (three) times daily as needed for anxiety or sleep.   anastrozole (ARIMIDEX) 1 MG tablet Take 1 tablet (1 mg total) by mouth daily.   busPIRone (BUSPAR) 5 MG tablet TAKE ONE TABLET BY MOUTH IN THE MORNING AND IN THE EVENING   Calcium Carbonate (CALTRATE 600 PO) Take 600 mg by mouth daily.   cholecalciferol (VITAMIN D) 1000 units tablet Take 1 tablet (1,000 Units total) by mouth daily.   donepezil (ARICEPT) 5 MG tablet Take 1 tablet (5 mg total) by mouth at bedtime. (Patient taking differently: Take 5 mg by mouth at bedtime as needed (memory).)   DULoxetine (CYMBALTA) 60 MG capsule TAKE ONE CAPSULE BY MOUTH DAILY   ferrous sulfate (FERROUSUL) 325 (65 FE) MG tablet Take 1 tablet (325 mg total) by mouth daily with breakfast.   fluticasone (FLONASE) 50 MCG/ACT nasal spray PLACE 2 SPRAYS INTO BOTH NOSTRILS DAILY. (Patient taking differently: Place 2 sprays into both nostrils daily as needed for allergies.)   HYDROcodone-acetaminophen (NORCO) 10-325 MG tablet Take 0.5-1 tablets by mouth every 6 (six) hours as needed.   ipratropium-albuterol (DUONEB) 0.5-2.5 (3) MG/3ML SOLN Take 3 mLs by nebulization 2 (two) times daily.   levothyroxine (SYNTHROID) 112 MCG tablet TAKE ONE TABLET BY MOUTH DAILY   methocarbamol (ROBAXIN) 500 MG tablet Take 1 tablet (500 mg total) by mouth every 6 (six) hours as needed for muscle  spasms. Please contact clinic about follow up with palliative care   mometasone-formoterol (DULERA) 200-5 MCG/ACT AERO Inhale 2 puffs into the lungs 2 (two) times daily.   Multiple Vitamin (MULTIVITAMIN WITH MINERALS) TABS tablet Take 1 tablet by mouth daily.   nitroGLYCERIN (NITROSTAT) 0.4 MG SL tablet Place 1 tablet (0.4 mg total) under the tongue every 5 (five) minutes as needed for chest pain.   OXYGEN Inhale 3-3.5 L into the lungs continuous.   pantoprazole (PROTONIX) 20 MG tablet TAKE ONE TABLET BY MOUTH TWICE DAILY   PERCOCET 5-325 MG tablet Take 0.5 tablets by mouth 3 (three) times daily.   polyethylene glycol (MIRALAX / GLYCOLAX) 17 g packet Take 17 g by mouth daily as needed for moderate constipation.   rosuvastatin (CRESTOR) 10 MG tablet Take 1 tablet (10 mg total) by mouth daily.   senna-docusate (SENOKOT-S) 8.6-50 MG tablet Take 1 tablet by mouth at bedtime.   SPIRIVA HANDIHALER 18 MCG inhalation capsule INHALE THE CONTENTS OF ONE CAPSULE VIA HANDIHALER DAILY (Patient taking differently: 18 mcg daily as needed (sob).)   traZODone (DESYREL) 50 MG tablet Take 2 tablets (100 mg total) by mouth at bedtime.   [DISCONTINUED] bisoprolol (ZEBETA) 5 MG tablet Take 0.5 tablets (2.5 mg total) by mouth daily.   No facility-administered encounter medications on file as of 01/31/2022.   Reviewed hospital notes for details of recent visit. Patient has been contacted by Transitions of Care team: No  Admitted to the ED on  01/28/2022. Discharge date was 01/28/2022.  Discharged from [facility].   Discharge diagnosis (Principal Problem): Generalized weakness Patient was discharged to Home  Brief summary of hospital course:  Cardiac monitoring showing sinus rhythm. Diagnosis is present.  White count 5 hemoglobin 10.7 chemistry shows moderately elevated bicarb.  Otherwise unremarkable labs.  Showing signs of atelectasis.  EKG shows sinus rhythm no ST elevations or depressions  noted. Patient continues have no focal neurodeficit.  She complains of pain "all over my whole body." Recommending continue hydrocodone that she has at home as needed for pain at home.  I have also sent a message to her surgeon regarding the patient's questions regarding her pending kyphoplasty. Recommending immediate return if she has fevers worsening symptoms new pains or any additional concerns.  Medications that remain the same after Hospital Discharge:??  -All other medications will remain the same.    Next CCM appt: None scheduled - patient had appointment on 01/29/2022  Other upcoming appts: PCP appointment on 02/20/2022  Charlene Brooke, PharmD notified and will determine if action is needed.  Charlene Brooke, CPP notified  Marijean Niemann, [place] Clinical Pharmacy Assistant [phone]

## 2022-01-31 NOTE — Telephone Encounter (Signed)
Called pt's daughter to let her know that we will need the ct done first. She is going to call over to GI to get this scheduled. I will resend insurance request once it is done. AW

## 2022-02-01 ENCOUNTER — Ambulatory Visit
Admission: RE | Admit: 2022-02-01 | Discharge: 2022-02-01 | Disposition: A | Discharge status: Home / Self Care | Payer: Medicare PPO | Source: Ambulatory Visit | Attending: Specialist | Admitting: Specialist

## 2022-02-01 DIAGNOSIS — S22000A Wedge compression fracture of unspecified thoracic vertebra, initial encounter for closed fracture: Secondary | ICD-10-CM

## 2022-02-01 DIAGNOSIS — M546 Pain in thoracic spine: Secondary | ICD-10-CM | POA: Diagnosis not present

## 2022-02-01 NOTE — Telephone Encounter (Signed)
Refaxed order via epic to interventional radiology

## 2022-02-02 ENCOUNTER — Ambulatory Visit: Payer: Medicare PPO | Admitting: *Deleted

## 2022-02-02 ENCOUNTER — Other Ambulatory Visit: Payer: Self-pay | Admitting: Specialist

## 2022-02-02 DIAGNOSIS — G894 Chronic pain syndrome: Secondary | ICD-10-CM

## 2022-02-02 DIAGNOSIS — J4489 Other specified chronic obstructive pulmonary disease: Secondary | ICD-10-CM

## 2022-02-02 DIAGNOSIS — S22000S Wedge compression fracture of unspecified thoracic vertebra, sequela: Secondary | ICD-10-CM

## 2022-02-02 DIAGNOSIS — J449 Chronic obstructive pulmonary disease, unspecified: Secondary | ICD-10-CM

## 2022-02-02 DIAGNOSIS — S22060A Wedge compression fracture of T7-T8 vertebra, initial encounter for closed fracture: Secondary | ICD-10-CM

## 2022-02-02 DIAGNOSIS — I10 Essential (primary) hypertension: Secondary | ICD-10-CM

## 2022-02-02 NOTE — Chronic Care Management (AMB) (Signed)
Chronic Care Management    Clinical Social Work Note  02/02/2022 Name: [name] MRN: [id number] DOB: Dec 14, 1930  [name] is a 88 y.o. year old female who is a primary care patient of Tonia Ghent, MD. The CCM team was consulted to assist the patient with chronic disease management and/or care coordination needs related to: [employer] , Level of Care Concerns, and Caregiver Stress.   Engaged with patient by telephone for follow up visit in response to provider referral for social work chronic care management and care coordination services.   Consent to Services:  The patient was given information about Chronic Care Management services, agreed to services, and gave verbal consent prior to initiation of services.  Please see initial visit note for detailed documentation.   Patient agreed to services and consent obtained.   Assessment: Review of patient past medical history, allergies, medications, and health status, including review of relevant consultants reports was performed today as part of a comprehensive evaluation and provision of chronic care management and care coordination services.     SDOH (Social Determinants of Health) assessments and interventions performed:    Advanced Directives Status: See Care Plan for related entries.  CCM Care Plan  Allergies  Allergen Reactions   Doxycycline Other (See Comments)    GI upset   Sulfadiazine Other (See Comments)    REACTION: Fever, aches   Sulfa Antibiotics Rash    Outpatient Encounter Medications as of 02/02/2022  Medication Sig   albuterol (PROAIR HFA) 108 (90 Base) MCG/ACT inhaler Inhale 1-2 puffs into the lungs every 6 (six) hours as needed for wheezing or shortness of breath (Okay to fill with Ventolin or albuterol HFA).   alendronate (FOSAMAX) 70 MG tablet Take 1 tablet (70 mg total) by mouth every Monday.   ALPRAZolam (XANAX) 0.25 MG tablet Take 1 tablet (0.25 mg total) by mouth 3  (three) times daily as needed for anxiety or sleep.   anastrozole (ARIMIDEX) 1 MG tablet Take 1 tablet (1 mg total) by mouth daily.   busPIRone (BUSPAR) 5 MG tablet TAKE ONE TABLET BY MOUTH IN THE MORNING AND IN THE EVENING   Calcium Carbonate (CALTRATE 600 PO) Take 600 mg by mouth daily.   cholecalciferol (VITAMIN D) 1000 units tablet Take 1 tablet (1,000 Units total) by mouth daily.   donepezil (ARICEPT) 5 MG tablet Take 1 tablet (5 mg total) by mouth at bedtime. (Patient taking differently: Take 5 mg by mouth at bedtime as needed (memory).)   DULoxetine (CYMBALTA) 60 MG capsule TAKE ONE CAPSULE BY MOUTH DAILY   ferrous sulfate (FERROUSUL) 325 (65 FE) MG tablet Take 1 tablet (325 mg total) by mouth daily with breakfast.   fluticasone (FLONASE) 50 MCG/ACT nasal spray PLACE 2 SPRAYS INTO BOTH NOSTRILS DAILY. (Patient taking differently: Place 2 sprays into both nostrils daily as needed for allergies.)   HYDROcodone-acetaminophen (NORCO) 10-325 MG tablet Take 0.5-1 tablets by mouth every 6 (six) hours as needed.   ipratropium-albuterol (DUONEB) 0.5-2.5 (3) MG/3ML SOLN Take 3 mLs by nebulization 2 (two) times daily.   levothyroxine (SYNTHROID) 112 MCG tablet TAKE ONE TABLET BY MOUTH DAILY   methocarbamol (ROBAXIN) 500 MG tablet Take 1 tablet (500 mg total) by mouth every 6 (six) hours as needed for muscle spasms. Please contact clinic about follow up with palliative care   mometasone-formoterol (DULERA) 200-5 MCG/ACT AERO Inhale 2 puffs into the lungs 2 (two) times daily.   Multiple Vitamin (MULTIVITAMIN WITH MINERALS) TABS tablet  Take 1 tablet by mouth daily.   nitroGLYCERIN (NITROSTAT) 0.4 MG SL tablet Place 1 tablet (0.4 mg total) under the tongue every 5 (five) minutes as needed for chest pain.   OXYGEN Inhale 3-3.5 L into the lungs continuous.   pantoprazole (PROTONIX) 20 MG tablet TAKE ONE TABLET BY MOUTH TWICE DAILY   PERCOCET 5-325 MG tablet Take 0.5 tablets by mouth 3 (three) times daily.    polyethylene glycol (MIRALAX / GLYCOLAX) 17 g packet Take 17 g by mouth daily as needed for moderate constipation.   rosuvastatin (CRESTOR) 10 MG tablet Take 1 tablet (10 mg total) by mouth daily.   senna-docusate (SENOKOT-S) 8.6-50 MG tablet Take 1 tablet by mouth at bedtime.   SPIRIVA HANDIHALER 18 MCG inhalation capsule INHALE THE CONTENTS OF ONE CAPSULE VIA HANDIHALER DAILY (Patient taking differently: 18 mcg daily as needed (sob).)   traZODone (DESYREL) 50 MG tablet Take 2 tablets (100 mg total) by mouth at bedtime.   [DISCONTINUED] bisoprolol (ZEBETA) 5 MG tablet Take 0.5 tablets (2.5 mg total) by mouth daily.   No facility-administered encounter medications on file as of 02/02/2022.    Patient Active Problem List   Diagnosis Date Noted   Acute exacerbation of chronic obstructive pulmonary disease (COPD) ([place]) 94/85/4627   Acute metabolic encephalopathy 03/50/0938   Ground-level fall 12/06/2021   Breast cancer ([place])    Chronic systolic CHF (congestive heart failure) ([place])    Syncope 09/25/2021   Generalized weakness 07/07/2020   Decreased appetite 09/20/2019   Lower GI bleeding 09/09/2019   DNR (do not resuscitate) 09/08/2019   Chronic anemia 09/07/2019   Macrocytic anemia 09/07/2019   Jerking 09/07/2019   Fall at home, initial encounter 09/07/2019   History of CVA (cerebrovascular accident) 09/07/2019   History of breast cancer 09/07/2019   Pelvic pain 03/25/2019   Dysphagia 02/18/2019   Coronary artery disease involving native coronary artery of native heart with unstable angina pectoris ([place]) 01/30/2019   Presence of drug coated stent in right coronary artery 01/30/2019   Left main coronary artery disease - ~50-60% dLM (&70% dLAD) -- Plan Med Rx 01/30/2019   Ischemic cardiomyopathy 01/30/2019   Acute ST elevation myocardial infarction (STEMI) of inferior wall ([place]) 01/29/2019   AAA (abdominal aortic aneurysm) ([place]) 12/31/2018   Malignant neoplasm of upper-outer quadrant of  right breast in female, estrogen receptor positive ([place]) 09/26/2018   Breast mass 08/28/2018   Bilateral impacted cerumen 08/14/2017   Laryngopharyngeal reflux (LPR) 08/14/2017   Presbycusis of both ears 08/14/2017   Thoracic aortic aneurysm 04/10/2017   Abdominal pain 04/05/2017   Low back pain 01/01/2017   Medicare annual wellness visit, subsequent 09/26/2016   Hard of hearing 09/26/2016   Vitamin D deficiency 09/26/2016   Advance care planning 09/26/2016   Encounter for chronic pain management 07/26/2016   Edema 10/21/2014   Multinodular thyroid 07/07/2013   COPD (chronic obstructive pulmonary disease) ([place]) 06/09/2013   HTN (hypertension) 06/09/2013   Chronic respiratory failure with hypoxia ([place]) 05/13/2013   Cough 05/02/2013   Vertigo 12/31/2012   Acute blood loss anemia 12/13/2011   Osteoarthritis of right knee 08/27/2011   Gold C Copd with asthmatic bronchitis component and chronic resp failure 06/26/2011   Neck pain 04/02/2011   RISK OF FALLING 08/17/2010   Hypokalemia 05/24/2009   Anemia, unspecified 04/10/2008   Anxiety 03/01/2008   Osteoporosis 06/23/2007   Chronic pain syndrome 12/26/2006   Acquired hypothyroidism 12/24/2006   Hyperlipidemia with target LDL less than 70  12/24/2006   COMMON MIGRAINE 12/24/2006   GERD 12/24/2006   DEGENERATIVE [place] DISEASE 12/24/2006   Fibromyalgia 12/24/2006   Insomnia 12/24/2006   HYPERGLYCEMIA 12/24/2006    Conditions to be addressed/monitored: Osteoarthritis; Level of care concerns, Medication procurement, ADL IADL limitations, and Limited access to caregiver  Care Plan : LCSW Plan of Care  Updates made by Deirdre Peer, LCSW since 02/02/2022 12:00 AM     Problem: Social and Functional Symptoms   Priority: High  Note:   Current barriers:    Level of care concerns, ADL IADL limitations, Limited access to caregiver, Cognitive Deficits, Inability to perform ADL's independently, and Lacks knowledge of community  resource:   Clinical Goals: Patient will work with team  to address needs related to caregiving, planning, etc Clinical Interventions:  02/02/22- Spoke with both daughters by phone (separately today). Jana Half is local and with pt now- states she is confused this morning- suggested she call the [facility] to alert and see if they can assess/do urinalysis if needed to r/o UTI, etc.  Pt is getting HH PT and OT as well. The [place] visit is pending for this week also.  Pt had an MRI and the family is awaiting the results; as well as for khyphoplasty to be scheduled.  They are also looking at transitioning her medications to [facility] from the mail-order that is not insurance preferred- per daughter, [name], have to get pt and sister on board for the change.  Also, [name] alerted CSW to the ER visit recently for lethargy and unable to move her legs; unsure of cause- may be back related.   01/26/22- CSW spoke with Christianne Borrow, by phone who reports she is awaiting call back from the planner to schedule the kyphoplasty. Per daughter, HH has begun and appears to be minimal with once weekly sessions thus far from PT and OT.   Assessment of needs, barriers , agencies contacted, as well as how impacting  Solution-Focused Strategies employed:  Active listening / Reflection utilized  Problem [place] strategies reviewed Caregiver stress acknowledged  Consideration of in-home help encouraged : options discussed Review various resources, discussed options and provided patient information about  Referral to care guide ( )  1:1 collaboration with primary care provider regarding development and update of comprehensive plan of care as evidenced by provider attestation and co-signature Inter-disciplinary care team collaboration (see longitudinal plan of care) Patient Goals/Self-Care Activities: Over the next 30 days -continue with in-home private duty caregiver/companion as needed -continue with Home Health PT/OT  etc -review [employer] benefits- call to seek info as needed -review [employer] and Attendants program for eligibility - begin a notebook of services in my neighborhood or community - call 211 when I need some help - follow-up on any referrals for help I am given - think ahead to make sure my need does not become an emergency - make a note about what I need to have by the phone or take with me, like an identification card or social security number have a back-up plan - have a back-up plan - make a list of family or friends that I can call           Follow Up Plan: Appointment scheduled for SW follow up with client by phone on: 02/08/22      Eduard Clos MSW, [place] Licensed Clinical Social Worker [place]   [phone]

## 2022-02-02 NOTE — Patient Instructions (Signed)
Visit Information  Thank you for taking time to visit with me today. Please don't hesitate to contact me if I can be of assistance to you before our next scheduled telephone appointment.  Following are the goals we discussed today:  -continue with in-home private duty caregiver/companion as needed -continue with Home Health PT/OT etc -review [employer] benefits- call to seek info as needed -review [employer] Att[employer] program for eligibility - begin a notebook of services in my neighborhood or community - call 211 when I need some help - follow-up on any referrals for help I am given - think ahead to make sure my need does not become an emergency - make a note about what I need to have by the phone or take with me, like an identification card or social security number have a back-up plan - have a back-up plan - make a list of family or friends that I can call     Our next appointment is by telephone on 02/08/22 at 10  Please call the care guide team at [phone] if you need to cancel or reschedule your appointment.   If you are experiencing a Mental Health or [place] or need someone to talk to, please call 911   Patient verbalizes understanding of instructions and care plan provided today and agrees to view in [place]. Active MyChart status and patient understanding of how to access instructions and care plan via MyChart confirmed with patient.     Eduard Clos MSW, LCSW Licensed Clinical Social Worker Care Coordination    [phone]

## 2022-02-05 ENCOUNTER — Telehealth: Payer: Self-pay | Admitting: Pharmacist

## 2022-02-05 DIAGNOSIS — F419 Anxiety disorder, unspecified: Secondary | ICD-10-CM | POA: Diagnosis not present

## 2022-02-05 DIAGNOSIS — E785 Hyperlipidemia, unspecified: Secondary | ICD-10-CM | POA: Diagnosis not present

## 2022-02-05 DIAGNOSIS — E039 Hypothyroidism, unspecified: Secondary | ICD-10-CM

## 2022-02-05 DIAGNOSIS — I2510 Atherosclerotic heart disease of native coronary artery without angina pectoris: Secondary | ICD-10-CM | POA: Diagnosis not present

## 2022-02-05 DIAGNOSIS — I10 Essential (primary) hypertension: Secondary | ICD-10-CM | POA: Diagnosis not present

## 2022-02-05 DIAGNOSIS — I5022 Chronic systolic (congestive) heart failure: Secondary | ICD-10-CM

## 2022-02-05 DIAGNOSIS — M810 Age-related osteoporosis without current pathological fracture: Secondary | ICD-10-CM

## 2022-02-05 DIAGNOSIS — K219 Gastro-esophageal reflux disease without esophagitis: Secondary | ICD-10-CM | POA: Diagnosis not present

## 2022-02-05 DIAGNOSIS — M1990 Unspecified osteoarthritis, unspecified site: Secondary | ICD-10-CM | POA: Diagnosis not present

## 2022-02-05 DIAGNOSIS — I110 Hypertensive heart disease with heart failure: Secondary | ICD-10-CM | POA: Diagnosis not present

## 2022-02-05 DIAGNOSIS — J9611 Chronic respiratory failure with hypoxia: Secondary | ICD-10-CM | POA: Diagnosis not present

## 2022-02-05 DIAGNOSIS — J449 Chronic obstructive pulmonary disease, unspecified: Secondary | ICD-10-CM

## 2022-02-05 DIAGNOSIS — C50919 Malignant neoplasm of unspecified site of unspecified female breast: Secondary | ICD-10-CM | POA: Diagnosis not present

## 2022-02-05 DIAGNOSIS — Z9181 History of falling: Secondary | ICD-10-CM

## 2022-02-05 DIAGNOSIS — J9610 Chronic respiratory failure, unspecified whether with hypoxia or hypercapnia: Secondary | ICD-10-CM

## 2022-02-05 NOTE — Patient Instructions (Signed)
Visit Information  Phone number for Pharmacist: [phone]   Goals Addressed   None     Care Plan : General Pharmacy (Adult)  Updates made by Charlton Haws, RPH since 02/05/2022 12:00 AM     Problem: Hypertension, Hyperlipidemia, Heart Failure, Coronary Artery Disease, Hypothyroidism, Depression, and Osteoporosis      Long-Range Goal: Disease Management   Start Date: 04/11/2021  Expected End Date: 02/06/2023  This Visit's Progress: On track  Priority: High  Note:   Current Barriers:  Struggle to self-administer medication - requires family support  Pharmacist Clinical Goal(s):  Patient will achieve ability to self administer medications as prescribed through use of pill packs as evidenced by patient report through collaboration with PharmD and provider.   Interventions: 1:1 collaboration with Tonia Ghent, MD regarding development and update of comprehensive plan of care as evidenced by provider attestation and co-signature Inter-disciplinary care team collaboration (see longitudinal plan of care) Comprehensive medication review performed; medication list updated in electronic medical record  Hypertension / Heart Failure (BP goal <130/80) -Controlled - per clinic readings -Last ejection fraction: 40-45% (Date: 01/2019) -HF type: Moderately reduced EF; NYHA Class: not on file -Current treatment: None -Medications previously tried: amlodipine, furosemide, losartan,   -Current home readings: n/a -Denies hypotensive/hypertensive symptoms -Educated on BP goals and benefits of medications for prevention of heart attack, stroke and kidney damage; -Counseled to monitor BP at home periodically -Recommended to continue current medication  Hyperlipidemia / CAD: (LDL goal < 70) -Query controlled - LDL 60 (10/2021) at goal, however pt has not been taking clopidgrel, there is some confusion as it was discontinued during June admission but discharge summary notes to continue  Plavix; it appears it may have run out of refills and pt failed to make overdue follow up appt with cardiology -Hx STEMI 01/2019 - DES PCI; hx AAA -Follows with cardiology (Dr Tamala Julian), overdue for f/u appt - last VV 02/03/20. No show for appt 01/03/22 (clearance for back surgery) -Current treatment: Rosuvastatin 10 mg daily - Appropriate, Effective, Safe, Accessible Clopidogrel 75 mg daily - not taking Nitroglycerin 0.4 mgSL prn - Appropriate, Effective, Safe, Accessible -Medications previously tried: n/a  -Educated on Cholesterol goals;  -Reviewed importance of clopidogrel, will consult with cardiology about restarting it -Recommended to continue current medication   COPD (Goal: control symptoms and prevent exacerbations) -Controlled -Gold Grade: Unknown; Current COPD Classification:  B (high sx, <2 exacerbations/yr) -MMRC/CAT score: not on file; Pulmonary function testing: not on file -Exacerbations requiring treatment in last 6 months: 1 -Current treatment  Albuterol HFA prn - Appropriate, Effective, Safe, Accessible Duoneb PRN - Appropriate, Effective, Safe, Accessible Dulera (mometasone-formoterol) 200-5 mcg/act 2 puff BID - Appropriate, Effective, Safe, Accessible Spiriva Handihaler 87mg 1 puff daily - Appropriate, Effective, Safe, Accessible Oxygen 3 L -Medications previously tried: n/a  -Patient reports consistent use of maintenance inhaler -Frequency of rescue inhaler use: PRN -Counseled on Proper inhaler technique; -Recommended to continue current medication  Depression/Anxiety (Goal: manage symptoms) -Controlled -PHQ9: 0 (10/2021) -GAD7: 0 (07/2020) -Connected with PCP for mental health support -Current treatment: Alprazolam 0.25 mg TID prn - Appropriate, Effective, Safe, Accessible Buspirone 5 mg BID - Appropriate, Effective, Safe, Accessible Duloxetine 60 mg daily - Appropriate, Effective, Safe, Accessible Trazodone 150 mg (Wynetta Emery - Appropriate, Effective, Safe,  Accessible -Medications previously tried/failed: n/a -Educated on Benefits of medication for symptom control -Recommended to continue current medication  Dementia (Goal: slow progression) -Controlled -Current treatment  Donepezil 5 mg HS - Appropriate, Effective, Safe,  Accessible -Medications previously tried: n/a  -Recommended to continue current medication  Osteoporosis (Goal prevent fractures) -Query controlled - alendronate was started after most recent DEXA, no repeat DEXA has been performed yet -Last DEXA Scan: 02/2019   T-Score total hip: -4.4  T-Score forearm radius: -3.5 -Patient is a candidate for pharmacologic treatment due to history of vertebral fracture and T-Score < -2.5 in total hip  -Current treatment  Alendronate 70 mg weekly (started 10/2018) - Appropriate, Query Effective Calcium carbonate 600 mg - Appropriate, Effective, Safe, Accessible Vitamin D 1000 IU -Appropriate, Effective, Safe, Accessible -Medications previously tried: n/a  -Counseled on oral bisphosphonate administration: take in the morning, 30 minutes prior to food with 6-8 oz of water. Do not lie down for at least 30 minutes after taking. -Recommended to continue current medication  Hypothyroidism (Goal TSH: 0.4-4.5) -Query controlled- TSH low at last check (0.067 12/06/2021) when checked during hospitalization -Current treatment: Levothyroxine 112 mcg daily - Appropriate, Query Effective -Counseled to take medication n/a -Recommended repeat TSH   Chronic pain (Goal: manage pain) -Controlled -Follows with NP Earmon Phoenix ([facility]) -Current treatment  Hydrocodone-APAP 10-325 mg 1/2-1 tab q6h PRN - Appropriate, Effective, Safe, Accessible Methocarbamol 500 mg q6h PRN -Appropriate, Effective, Safe, Accessible Duloxetine 60 mg daily -Appropriate, Effective, Safe, Accessible Gabapentin 300 mg TID -Appropriate, Effective, Safe, Accessible Tylenol #3 Wynetta Emery) -Appropriate,  Effective, Safe, Accessible -Medications previously tried: n/a  -Recommended to continue current medication  Hx Breast cancer (Goal: prevent recurrence) -Controlled -Dx 08/2018; last seen Oncology (Dr Jana Hakim) 09/2019 -Current treatment  Anastrozole 1 mg daily (started 03/2019) -Appropriate, Effective, Safe, Accessible -Medications previously tried: n/a  -Recommended to continue current medication  Medication management -Daughter [name] (lives local) manages medications for patient. 2nd daughter [name] is HCPOA but lives in [place] is currently using [facility] (based in [place], [employer]), it is often difficult to coordinate med changes and delivery and it is not preferred with her insurance. Reviewed local pharmacy [facility] can package meds and deliver within a day, and is preferred. Family will discuss with patient switching pharmacies for improved provider oversight over medications  Health Maintenance -Vaccine gaps: Shingrix, TDAP -Hx Lower GI bleeding 09/2019 -Current therapy:  Ferrous sulfate 325 mg Flonase PRN Multivitamin Miralax Senna +Pantoprazole 20 mg daily  Patient Goals/Self-Care Activities Patient will:  - take medications as prescribed as evidenced by patient report and record review focus on medication adherence by pill packs - consider switching to local [facility] pharmacy       Patient verbalizes understanding of instructions and care plan provided today and agrees to view in [place]. Active MyChart status and patient understanding of how to access instructions and care plan via MyChart confirmed with patient.    Telephone follow up appointment with pharmacy team member scheduled for: 2 months  Charlene Brooke, PharmD, [facility] Clinical Pharmacist [facility] at [facility] [phone]

## 2022-02-05 NOTE — Telephone Encounter (Signed)
Discrepancy with clopidogrel discovered during chart review/med rec. Patient was previously on clopiodgrel 75 mg daily for history of CAD and multiple TIAs. During recent hospitalization (12/06/21 - 12/13/21) for respiratory failure, the discharge summary notes to "continue Plavix/statin" for CAD but it was discontinued from the med list at discharge since patient was not taking it at home. Per further chart review, it appears the medication ran out of refills after patient failed to schedule overdue appt with cardiology.  Given cardiac/TIA history and lack of bleeding complications it is likely still beneficial to continue clopidogrel. Consulting with cardiology to clarify if clopidogrel should be refilled/added back to med list.

## 2022-02-07 ENCOUNTER — Other Ambulatory Visit (HOSPITAL_COMMUNITY): Payer: Self-pay | Admitting: Interventional Radiology

## 2022-02-07 ENCOUNTER — Telehealth (HOSPITAL_COMMUNITY): Payer: Self-pay

## 2022-02-07 DIAGNOSIS — S22050A Wedge compression fracture of T5-T6 vertebra, initial encounter for closed fracture: Secondary | ICD-10-CM

## 2022-02-07 DIAGNOSIS — S22060A Wedge compression fracture of T7-T8 vertebra, initial encounter for closed fracture: Secondary | ICD-10-CM

## 2022-02-07 NOTE — Telephone Encounter (Signed)
Ok per Dr. Estanislado Pandy for T7 possible VP and T6 KP or VP. AW

## 2022-02-09 ENCOUNTER — Other Ambulatory Visit: Payer: Medicare PPO

## 2022-02-09 ENCOUNTER — Encounter: Payer: Self-pay | Admitting: *Deleted

## 2022-02-09 ENCOUNTER — Encounter: Payer: Self-pay | Admitting: Family Medicine

## 2022-02-09 ENCOUNTER — Ambulatory Visit (INDEPENDENT_AMBULATORY_CARE_PROVIDER_SITE_OTHER): Payer: Medicare PPO | Admitting: *Deleted

## 2022-02-09 ENCOUNTER — Ambulatory Visit
Admission: RE | Admit: 2022-02-09 | Discharge: 2022-02-09 | Payer: Medicare PPO | Source: Ambulatory Visit | Attending: Physician Assistant | Admitting: Physician Assistant

## 2022-02-09 ENCOUNTER — Telehealth: Payer: Self-pay | Admitting: Family Medicine

## 2022-02-09 VITALS — BP 134/83 | HR 89 | Temp 98.4°F | Resp 16

## 2022-02-09 DIAGNOSIS — J449 Chronic obstructive pulmonary disease, unspecified: Secondary | ICD-10-CM

## 2022-02-09 DIAGNOSIS — R410 Disorientation, unspecified: Secondary | ICD-10-CM

## 2022-02-09 DIAGNOSIS — M797 Fibromyalgia: Secondary | ICD-10-CM

## 2022-02-09 DIAGNOSIS — F0390 Unspecified dementia without behavioral disturbance: Secondary | ICD-10-CM

## 2022-02-09 DIAGNOSIS — M818 Other osteoporosis without current pathological fracture: Secondary | ICD-10-CM

## 2022-02-09 DIAGNOSIS — R443 Hallucinations, unspecified: Secondary | ICD-10-CM | POA: Diagnosis not present

## 2022-02-09 DIAGNOSIS — I10 Essential (primary) hypertension: Secondary | ICD-10-CM

## 2022-02-09 DIAGNOSIS — S22000S Wedge compression fracture of unspecified thoracic vertebra, sequela: Secondary | ICD-10-CM

## 2022-02-09 DIAGNOSIS — R350 Frequency of micturition: Secondary | ICD-10-CM

## 2022-02-09 DIAGNOSIS — G894 Chronic pain syndrome: Secondary | ICD-10-CM

## 2022-02-09 LAB — POCT URINALYSIS DIP (MANUAL ENTRY)
Bilirubin, UA: NEGATIVE
Blood, UA: NEGATIVE
Glucose, UA: NEGATIVE mg/dL
Leukocytes, UA: NEGATIVE
Nitrite, UA: NEGATIVE
Protein Ur, POC: 30 mg/dL — AB
Spec Grav, UA: 1.025
Urobilinogen, UA: 0.2 U/dL
pH, UA: 5.5

## 2022-02-09 NOTE — Patient Instructions (Addendum)
Visit Information  Thank you for taking time to visit with me today. Please don't hesitate to contact me if I can be of assistance to you before our next scheduled telephone appointment.  Following are the goals we discussed today:  -inquire today at PCP office about concerns related to confusion; ?UTI  -continue with in-home private duty caregiver/companion as needed -continue with Home Health PT/OT etc -review [employer] benefits- call to seek info as needed -review [employer] and Attendants program for eligibility - begin a notebook of services in my neighborhood or community - call 211 when I need some help - follow-up on any referrals for help I am given - think ahead to make sure my need does not become an emergency - make a note about what I need to have by the phone or take with me, like an identification card or social security number have a back-up plan - have a back-up plan - make a list of family or friends that I can call    Our next appointment is by telephone on 02/20/22 at 10  Please call the care guide team at [phone] if you need to cancel or reschedule your appointment.   If you are experiencing a Mental Health or [place] or need someone to talk to, please call the [place] National Suicide Prevention Lifeline: 214-543-7915 or TTY: [phone] TTY (724) 718-8660) to talk to a trained counselor call 911   Patient verbalizes understanding of instructions and care plan provided today and agrees to view in [place]. Active MyChart status and patient understanding of how to access instructions and care plan via MyChart confirmed with patient.     Eduard Clos MSW, LCSW Licensed Clinical Social Worker [place]   [phone]

## 2022-02-09 NOTE — Telephone Encounter (Signed)
See note below.  Please triage patient.  I am not in clinic today.  I did not add on extra orders since I think she likely needs in person evaluation either at our clinic or urgent care.  Thanks.    Deirdre Peer, LCSW  Tonia Ghent, MD Dr. Damita Dunnings,  Patient is presenting with confusion and maybe delirium per daughter Chauncey [place] in [place]) . Her son visited and pt was "talking to [facility] in the bathroom" and thought Suzie was watching her eat... I am wondering if she may have a UTI. She is scheduled to be at [facility] today @ 2pm for labs- was thinking you might want to add additional labs/UA and/or see her. Thanks,  Eduard Clos MSW, LCSW  Licensed Clinical Social Worker  [place]  [phone]

## 2022-02-09 NOTE — Chronic Care Management (AMB) (Signed)
Chronic Care Management    Clinical Social Work Note  02/09/2022 Name: [name] MRN: [id number] DOB: [birth year]  Cimberly Stoffel is a 88 y.o. year old female who is a primary care patient of Tonia Ghent, MD. The CCM team was consulted to assist the patient with chronic disease management and/or care coordination needs related to: [employer] , Level of Care Concerns, and Caregiver Stress.   Collaboration with daughter and PCP office  for follow up visit in response to provider referral for social work chronic care management and care coordination services.   Consent to Services:  The patient was given information about Chronic Care Management services, agreed to services, and gave verbal consent prior to initiation of services.  Please see initial visit note for detailed documentation.   Patient agreed to services and consent obtained.   Assessment: Review of patient past medical history, allergies, medications, and health status, including review of relevant consultants reports was performed today as part of a comprehensive evaluation and provision of chronic care management and care coordination services.     SDOH (Social Determinants of Health) assessments and interventions performed:    Advanced Directives Status: See Care Plan for related entries.  CCM Care Plan  Allergies  Allergen Reactions   Doxycycline Other (See Comments)    GI upset   Sulfadiazine Other (See Comments)    REACTION: Fever, aches   Sulfa Antibiotics Rash    Outpatient Encounter Medications as of 02/09/2022  Medication Sig   albuterol (PROAIR HFA) 108 (90 Base) MCG/ACT inhaler Inhale 1-2 puffs into the lungs every 6 (six) hours as needed for wheezing or shortness of breath (Okay to fill with Ventolin or albuterol HFA).   alendronate (FOSAMAX) 70 MG tablet Take 1 tablet (70 mg total) by mouth every Monday.   ALPRAZolam (XANAX) 0.25 MG tablet Take 1 tablet (0.25 mg total) by mouth  3 (three) times daily as needed for anxiety or sleep.   anastrozole (ARIMIDEX) 1 MG tablet Take 1 tablet (1 mg total) by mouth daily.   busPIRone (BUSPAR) 5 MG tablet TAKE ONE TABLET BY MOUTH IN THE MORNING AND IN THE EVENING   Calcium Carbonate (CALTRATE 600 PO) Take 600 mg by mouth daily.   cholecalciferol (VITAMIN D) 1000 units tablet Take 1 tablet (1,000 Units total) by mouth daily.   donepezil (ARICEPT) 5 MG tablet Take 1 tablet (5 mg total) by mouth at bedtime. (Patient taking differently: Take 5 mg by mouth at bedtime as needed (memory).)   DULoxetine (CYMBALTA) 60 MG capsule TAKE ONE CAPSULE BY MOUTH DAILY   ferrous sulfate (FERROUSUL) 325 (65 FE) MG tablet Take 1 tablet (325 mg total) by mouth daily with breakfast.   fluticasone (FLONASE) 50 MCG/ACT nasal spray PLACE 2 SPRAYS INTO BOTH NOSTRILS DAILY. (Patient taking differently: Place 2 sprays into both nostrils daily as needed for allergies.)   HYDROcodone-acetaminophen (NORCO) 10-325 MG tablet Take 0.5-1 tablets by mouth every 6 (six) hours as needed.   ipratropium-albuterol (DUONEB) 0.5-2.5 (3) MG/3ML SOLN Take 3 mLs by nebulization 2 (two) times daily.   levothyroxine (SYNTHROID) 112 MCG tablet TAKE ONE TABLET BY MOUTH DAILY   methocarbamol (ROBAXIN) 500 MG tablet Take 1 tablet (500 mg total) by mouth every 6 (six) hours as needed for muscle spasms. Please contact clinic about follow up with palliative care   mometasone-formoterol (DULERA) 200-5 MCG/ACT AERO Inhale 2 puffs into the lungs 2 (two) times daily.   Multiple Vitamin (MULTIVITAMIN WITH MINERALS)  TABS tablet Take 1 tablet by mouth daily.   nitroGLYCERIN (NITROSTAT) 0.4 MG SL tablet Place 1 tablet (0.4 mg total) under the tongue every 5 (five) minutes as needed for chest pain.   OXYGEN Inhale 3-3.5 L into the lungs continuous.   pantoprazole (PROTONIX) 20 MG tablet TAKE ONE TABLET BY MOUTH TWICE DAILY   polyethylene glycol (MIRALAX / GLYCOLAX) 17 g packet Take 17 g by mouth  daily as needed for moderate constipation.   rosuvastatin (CRESTOR) 10 MG tablet Take 1 tablet (10 mg total) by mouth daily.   senna-docusate (SENOKOT-S) 8.6-50 MG tablet Take 1 tablet by mouth at bedtime.   SPIRIVA HANDIHALER 18 MCG inhalation capsule INHALE THE CONTENTS OF ONE CAPSULE VIA HANDIHALER DAILY (Patient taking differently: 18 mcg daily as needed (sob).)   traZODone (DESYREL) 50 MG tablet Take 2 tablets (100 mg total) by mouth at bedtime.   [DISCONTINUED] bisoprolol (ZEBETA) 5 MG tablet Take 0.5 tablets (2.5 mg total) by mouth daily.   No facility-administered encounter medications on file as of 02/09/2022.    Patient Active Problem List   Diagnosis Date Noted   Acute exacerbation of chronic obstructive pulmonary disease (COPD) ([place]) 41/93/7902   Acute metabolic encephalopathy 40/97/3532   Ground-level fall 12/06/2021   Breast cancer ([place])    Chronic systolic CHF (congestive heart failure) ([place])    Syncope 09/25/2021   Generalized weakness 07/07/2020   Decreased appetite 09/20/2019   Lower GI bleeding 09/09/2019   DNR (do not resuscitate) 09/08/2019   Chronic anemia 09/07/2019   Macrocytic anemia 09/07/2019   Jerking 09/07/2019   Fall at home, initial encounter 09/07/2019   History of CVA (cerebrovascular accident) 09/07/2019   History of breast cancer 09/07/2019   Pelvic pain 03/25/2019   Dysphagia 02/18/2019   Coronary artery disease involving native coronary artery of native heart with unstable angina pectoris ([place]) 01/30/2019   Presence of drug coated stent in right coronary artery 01/30/2019   Left main coronary artery disease - ~50-60% dLM (&70% dLAD) -- Plan Med Rx 01/30/2019   Ischemic cardiomyopathy 01/30/2019   Acute ST elevation myocardial infarction (STEMI) of inferior wall ([place]) 01/29/2019   AAA (abdominal aortic aneurysm) ([place]) 12/31/2018   Malignant neoplasm of upper-outer quadrant of right breast in female, estrogen receptor positive ([place]) 09/26/2018    Breast mass 08/28/2018   Bilateral impacted cerumen 08/14/2017   Laryngopharyngeal reflux (LPR) 08/14/2017   Presbycusis of both ears 08/14/2017   Thoracic aortic aneurysm 04/10/2017   Abdominal pain 04/05/2017   Low back pain 01/01/2017   Medicare annual wellness visit, subsequent 09/26/2016   Hard of hearing 09/26/2016   Vitamin D deficiency 09/26/2016   Advance care planning 09/26/2016   Encounter for chronic pain management 07/26/2016   Edema 10/21/2014   Multinodular thyroid 07/07/2013   COPD (chronic obstructive pulmonary disease) ([place]) 06/09/2013   HTN (hypertension) 06/09/2013   Chronic respiratory failure with hypoxia ([place]) 05/13/2013   Cough 05/02/2013   Vertigo 12/31/2012   Acute blood loss anemia 12/13/2011   Osteoarthritis of right knee 08/27/2011   Gold C Copd with asthmatic bronchitis component and chronic resp failure 06/26/2011   Neck pain 04/02/2011   RISK OF FALLING 08/17/2010   Hypokalemia 05/24/2009   Anemia, unspecified 04/10/2008   Anxiety 03/01/2008   Osteoporosis 06/23/2007   Chronic pain syndrome 12/26/2006   Acquired hypothyroidism 12/24/2006   Hyperlipidemia with target LDL less than 70 12/24/2006   COMMON MIGRAINE 12/24/2006   GERD 12/24/2006   DEGENERATIVE  [place] DISEASE 12/24/2006   Fibromyalgia 12/24/2006   Insomnia 12/24/2006   HYPERGLYCEMIA 12/24/2006    Conditions to be addressed/monitored: Pulmonary Disease and Osteoarthritis; Level of care concerns, ADL IADL limitations, and Inability to perform ADL's independently  Care Plan : LCSW Plan of Care  Updates made by Deirdre Peer, LCSW since 02/09/2022 12:00 AM     Problem: Social and Functional Symptoms   Priority: High  Note:   Current barriers:    Level of care concerns, ADL IADL limitations, Limited access to caregiver, Cognitive Deficits, Inability to perform ADL's independently, and Lacks knowledge of community resource:   Clinical Goals: Patient will work with team  to address  needs related to caregiving, planning, etc Clinical Interventions:  02/09/22- CSW spoke with daughter/JCPOA, [name], in [place] who reports family has shared with her that pt is more confused, sleeping more. CSW noted plans for lab work at PCP office today- will alert PCP to this for possible further assessment/tests (?UTI) . [place] care continues to work with pt also.  Kyphoplasty has been scheduled for next week on 02/13/22.    02/02/22- Spoke with both daughters by phone (separately today). Jana Half is local and with pt now- states she is confused this morning- suggested she call the [facility] to alert and see if they can assess/do urinalysis if needed to r/o UTI, etc.  Pt is getting HH PT and OT as well. The [place] visit is pending for this week also.  Pt had an MRI and the family is awaiting the results; as well as for khyphoplasty to be scheduled.  They are also looking at transitioning her medications to [facility] from the mail-order that is not insurance preferred- per daughter, [name], have to get pt and sister on board for the change.  Also, [name] alerted CSW to the ER visit recently for lethargy and unable to move her legs; unsure of cause- may be back related.   01/26/22- CSW spoke with Christianne Borrow, by phone who reports she is awaiting call back from the planner to schedule the kyphoplasty. Per daughter, HH has begun and appears to be minimal with once weekly sessions thus far from PT and OT.   Assessment of needs, barriers , agencies contacted, as well as how impacting  Solution-Focused Strategies employed:  Active listening / Reflection utilized  Problem [place] strategies reviewed Caregiver stress acknowledged  Consideration of in-home help encouraged : options discussed Review various resources, discussed options and provided patient information about  Referral to care guide ( )  1:1 collaboration with primary care provider regarding development and update of comprehensive plan of care as  evidenced by provider attestation and co-signature Inter-disciplinary care team collaboration (see longitudinal plan of care) Patient Goals/Self-Care Activities: Over the next 30 days -continue with in-home private duty caregiver/companion as needed -continue with Home Health PT/OT etc -review [employer] benefits- call to seek info as needed -review [employer] and Att[employer] program for eligibility - begin a notebook of services in my neighborhood or community - call 211 when I need some help - follow-up on any referrals for help I am given - think ahead to make sure my need does not become an emergency - make a note about what I need to have by the phone or take with me, like an identification card or social security number have a back-up plan - have a back-up plan - make a list of family or friends that I can call  Follow Up Plan: Appointment scheduled for SW follow up with client by phone on: 02/23/22      Eduard Clos MSW, [place] Licensed Clinical Social Worker [place]   [phone]

## 2022-02-09 NOTE — Telephone Encounter (Signed)
Noted. Thanks.

## 2022-02-09 NOTE — ED Provider Notes (Addendum)
[facility]    CSN: [id number] Arrival date & time: 02/09/22  1506      History   Chief Complaint Chief Complaint  Patient presents with   Urinary Frequency    some confusion - Entered by patient    HPI [name] is a 88 y.o. female.   Patient here today with daughter for evaluation of increased confusion, fatigue (sleeping more than normal) and hallucinations.  Social worker contacted PCP this morning with concerns of confusion as patient was to have labs drawn earlier at her PCP office.  PCP recommended further evaluation in person and appointment was made with urgent care.  Initially there was concern that UTI could be causing confusion.  Per daughter who is here with patient today, urinary frequency is not a new symptom.  Daughter who is present also reports that confusion has been ongoing at baseline.  Healthcare power of attorney ( daughter [name]) is currently in [place], and after conversation with her she reports that for the last month or so patient's had more hallucinations as well as increasing confusion.  Power of attorney also notes that she was put on other pain medications due to back pain from recent fall and she is scheduled to have kyphoplasty this upcoming week.  Chart review reveals that patient was seen in the ED 12 days ago and had elevated bicarb at that time.  Patient does have known COPD and is currently on 3 L however portable oxygen tank that she brings into the office today is not functioning correctly.  It appears that when she was discharged from the ED most recently she left AMA as daughter refused to pick up oxygen tank and return before transport.  The history is provided by the patient.  Urinary Frequency    Past Medical History:  Diagnosis Date   Abdominal pain 04/05/2017   Allergic rhinitis, cause unspecified    Allergy, unspecified not elsewhere classified    Anemia, unspecified 04/10/2008   Qualifier: Diagnosis of  By:  Linna Darner MD, [place] state 03/01/2008   Qualifier: Diagnosis of  By: Council Mechanic MD, Hilaria Ota    Asthma    Breast cancer ([place]) 08/2018   right breast   Breast mass 08/28/2018   Chronic kidney disease    frequency   COMMON MIGRAINE 12/24/2006   Qualifier: Diagnosis of  By: Fuller Plan CMA (AAMA), [place]     COPD (chronic obstructive pulmonary disease) ([place])    Degeneration of intervertebral disc, site unspecified    Diverticulosis of colon (without mention of hemorrhage)    DNR (do not resuscitate) 09/08/2019   Dysphagia 02/18/2019   Encounter for chronic pain management 07/26/2016   Indication for chronic opioid: chronic back pain Medication and dose:  Oxycodone '10mg'$  TID # pills per month: 90 Last UDS date: 04/15/15 Pain contract signed (Y/N):  yes Date narcotic database last reviewed (include red flags):  05/06/17   Esophageal reflux    Fibromyalgia 12/24/2006   Qualifier: Diagnosis of  By: Fuller Plan CMA (AAMA), [place]     Headache    HTN (hypertension) 06/09/2013   Hyperlipidemia with target LDL less than 70 12/24/2006   Qualifier: Diagnosis of  By: Fuller Plan CMA (AAMA), [place]     Hypokalemia 05/24/2009   Qualifier: Diagnosis of  By: Lacretia Nicks     Hypothyroidism 12/24/2006   Qualifier: Diagnosis of  By: Fuller Plan CMA (AAMA), [place]     Insomnia 12/24/2006  she had trials of antihistamines and TCAs prev w/o effect.      Insomnia, unspecified    Lower GI bleeding 09/09/2019   Myalgia and myositis, unspecified    Need for prophylactic hormone replacement therapy (postmenopausal)    Osteoarthritis of right knee 08/27/2011   R knee pain, s/p arthroscopy 2012 per [name]    Osteoporosis 06/23/2007   Qualifier: Diagnosis of  By: Council Mechanic MD, Hilaria Ota    Osteoporosis, unspecified    Other abnormal blood chemistry    Other and unspecified hyperlipidemia    Other chronic pain    Presence of drug coated stent in right coronary artery 01/30/2019   Stroke [facility])    mini  strokes   Syncope 01/24/2017   Thoracic aortic aneurysm ([place]) 04/10/2017   Unspecified essential hypertension    Unspecified hypothyroidism    Vitamin D deficiency 09/26/2016    Patient Active Problem List   Diagnosis Date Noted   Acute exacerbation of chronic obstructive pulmonary disease (COPD) ([place]) 58/52/7782   Acute metabolic encephalopathy 42/35/3614   Ground-level fall 12/06/2021   Breast cancer ([place])    Chronic systolic CHF (congestive heart failure) ([place])    Syncope 09/25/2021   Generalized weakness 07/07/2020   Decreased appetite 09/20/2019   Lower GI bleeding 09/09/2019   DNR (do not resuscitate) 09/08/2019   Chronic anemia 09/07/2019   Macrocytic anemia 09/07/2019   Jerking 09/07/2019   Fall at home, initial encounter 09/07/2019   History of CVA (cerebrovascular accident) 09/07/2019   History of breast cancer 09/07/2019   Pelvic pain 03/25/2019   Dysphagia 02/18/2019   Coronary artery disease involving native coronary artery of native heart with unstable angina pectoris ([place]) 01/30/2019   Presence of drug coated stent in right coronary artery 01/30/2019   Left main coronary artery disease - ~50-60% dLM (&70% dLAD) -- Plan Med Rx 01/30/2019   Ischemic cardiomyopathy 01/30/2019   Acute ST elevation myocardial infarction (STEMI) of inferior wall ([place]) 01/29/2019   AAA (abdominal aortic aneurysm) ([place]) 12/31/2018   Malignant neoplasm of upper-outer quadrant of right breast in female, estrogen receptor positive ([place]) 09/26/2018   Breast mass 08/28/2018   Bilateral impacted cerumen 08/14/2017   Laryngopharyngeal reflux (LPR) 08/14/2017   Presbycusis of both ears 08/14/2017   Thoracic aortic aneurysm 04/10/2017   Abdominal pain 04/05/2017   Low back pain 01/01/2017   Medicare annual wellness visit, subsequent 09/26/2016   Hard of hearing 09/26/2016   Vitamin D deficiency 09/26/2016   Advance care planning 09/26/2016   Encounter for chronic pain management 07/26/2016    Edema 10/21/2014   Multinodular thyroid 07/07/2013   COPD (chronic obstructive pulmonary disease) ([place]) 06/09/2013   HTN (hypertension) 06/09/2013   Chronic respiratory failure with hypoxia ([place]) 05/13/2013   Cough 05/02/2013   Vertigo 12/31/2012   Acute blood loss anemia 12/13/2011   Osteoarthritis of right knee 08/27/2011   Gold C Copd with asthmatic bronchitis component and chronic resp failure 06/26/2011   Neck pain 04/02/2011   RISK OF FALLING 08/17/2010   Hypokalemia 05/24/2009   Anemia, unspecified 04/10/2008   Anxiety 03/01/2008   Osteoporosis 06/23/2007   Chronic pain syndrome 12/26/2006   Acquired hypothyroidism 12/24/2006   Hyperlipidemia with target LDL less than 70 12/24/2006   COMMON MIGRAINE 12/24/2006   GERD 12/24/2006   DEGENERATIVE [place] DISEASE 12/24/2006   Fibromyalgia 12/24/2006   Insomnia 12/24/2006   HYPERGLYCEMIA 12/24/2006    Past Surgical History:  Procedure Laterality Date   BACK SURGERY  breast cystectomy     CORONARY/GRAFT ACUTE MI REVASCULARIZATION N/A 01/29/2019   Procedure: CORONARY/GRAFT ACUTE MI REVASCULARIZATION;  Surgeon: Belva Crome, MD;  Location: [facility];  Service: Cardiovascular;  Laterality: N/A;   JOINT REPLACEMENT     shoulder right   KNEE ARTHROSCOPY     Right knee 2012   MASS EXCISION Left 04/07/2015   Procedure: MINOR EXCISION OF MASS LEFT SMALL FINGER;  Surgeon: Leanora Cover, MD;  Location: [facility];  Service: Orthopedics;  Laterality: Left;   SHOULDER SURGERY     TOTAL KNEE ARTHROPLASTY  12/11/2011   Procedure: TOTAL KNEE ARTHROPLASTY;  Surgeon: Johnny Bridge, MD;  Location: [facility];  Service: Orthopedics;  Laterality: Right;   UMBILICAL HERNIA REPAIR N/A 06/09/2013   Procedure: EXPLORATIORY LAPAROTOMY, HERNIA REPAIR UMBILICAL, INSERTION OF MESH;  Surgeon: Haywood Lasso, MD;  Location: [facility];  Service: General;  Laterality: N/A;    OB History   No obstetric history on file.      Home  Medications    Prior to Admission medications   Medication Sig Start Date End Date Taking? Authorizing Provider  acetaminophen (TYLENOL) 500 MG tablet Take 1,000 mg by mouth every 6 (six) hours as needed for mild pain or headache.    [provider]  albuterol (PROAIR HFA) 108 (90 Base) MCG/ACT inhaler Inhale 1-2 puffs into the lungs every 6 (six) hours as needed for wheezing or shortness of breath (Okay to fill with Ventolin or albuterol HFA). 10/30/19   Tonia Ghent, MD  alendronate (FOSAMAX) 70 MG tablet Take 1 tablet (70 mg total) by mouth every Monday. 10/02/21   Benay Pike, MD  ALPRAZolam Duanne Moron) 0.5 MG tablet Take 0.5 mg by mouth daily as needed for anxiety or sleep. 01/01/22   [provider]  anastrozole (ARIMIDEX) 1 MG tablet Take 1 tablet (1 mg total) by mouth daily. 10/05/20   Magrinat, Virgie Dad, MD  busPIRone (BUSPAR) 5 MG tablet TAKE ONE TABLET BY MOUTH IN THE MORNING AND IN THE EVENING 01/18/22   Tonia Ghent, MD  Calcium Carbonate (CALTRATE 600 PO) Take 600 mg by mouth at bedtime.    [provider]  cholecalciferol (VITAMIN D3) 25 MCG (1000 UNIT) tablet Take 1,000 Units by mouth at bedtime.    [provider]  clopidogrel (PLAVIX) 75 MG tablet Take 75 mg by mouth daily. 12/13/21   [provider]  donepezil (ARICEPT) 5 MG tablet Take 1 tablet (5 mg total) by mouth at bedtime. 11/28/21   Tonia Ghent, MD  DULoxetine (CYMBALTA) 60 MG capsule TAKE ONE CAPSULE BY MOUTH DAILY 12/22/21   Tonia Ghent, MD  Ensure (ENSURE) Take 237 mLs by mouth 2 (two) times daily between meals.    [provider]  ferrous sulfate (FERROUSUL) 325 (65 FE) MG tablet Take 1 tablet (325 mg total) by mouth daily with breakfast. 08/22/18   Tonia Ghent, MD  fluticasone (FLONASE) 50 MCG/ACT nasal spray PLACE 2 SPRAYS INTO BOTH NOSTRILS DAILY. Patient taking differently: Place 2 sprays into both nostrils daily as needed for allergies. 01/28/20    Tonia Ghent, MD  HYDROcodone-acetaminophen ([place]) 7.5-325 MG tablet Take 1 tablet by mouth every 8 (eight) hours as needed for pain. 01/18/22   [provider]  ipratropium-albuterol (DUONEB) 0.5-2.5 (3) MG/3ML SOLN Take 3 mLs by nebulization 2 (two) times daily. 12/13/21   Florencia Reasons, MD  levothyroxine (SYNTHROID) 112 MCG tablet TAKE ONE  TABLET BY MOUTH DAILY Patient taking differently: Take 112 mcg by mouth daily before breakfast. 11/08/21   Tonia Ghent, MD  methocarbamol (ROBAXIN) 500 MG tablet Take 1 tablet (500 mg total) by mouth every 6 (six) hours as needed for muscle spasms. Please contact clinic about follow up with palliative care Patient taking differently: Take 250 mg by mouth every 6 (six) hours as needed for muscle spasms. Please contact clinic about follow up with palliative care 05/24/21   Tonia Ghent, MD  mometasone-formoterol [facility]) 200-5 MCG/ACT AERO Inhale 2 puffs into the lungs 2 (two) times daily. 12/13/21   Florencia Reasons, MD  Multiple Vitamin (MULTIVITAMIN WITH MINERALS) TABS tablet Take 1 tablet by mouth daily.    [provider]  Multiple Vitamins-Minerals (OCUVITE PRESERVISION PO) Take 1 tablet by mouth at bedtime.    [provider]  nitroGLYCERIN (NITROSTAT) 0.4 MG SL tablet Place 1 tablet (0.4 mg total) under the tongue every 5 (five) minutes as needed for chest pain. 02/23/20   Belva Crome, MD  OXYGEN Inhale 3-3.5 L into the lungs continuous.    [provider]  pantoprazole (PROTONIX) 20 MG tablet TAKE ONE TABLET BY MOUTH TWICE DAILY 10/23/21   Tonia Ghent, MD  polyethylene glycol (MIRALAX / GLYCOLAX) 17 g packet Take 17 g by mouth daily as needed for moderate constipation. 12/13/21   Florencia Reasons, MD  rosuvastatin (CRESTOR) 10 MG tablet Take 1 tablet (10 mg total) by mouth daily. 10/23/21   Tonia Ghent, MD  senna-docusate (SENOKOT-S) 8.6-50 MG tablet Take 1 tablet by mouth at bedtime. 12/13/21   Florencia Reasons, MD  SPIRIVA HANDIHALER  18 MCG inhalation capsule INHALE THE CONTENTS OF ONE CAPSULE VIA HANDIHALER DAILY Patient taking differently: Place 18 mcg into inhaler and inhale daily as needed (shortness of breath). 10/23/21   Tonia Ghent, MD  traZODone (DESYREL) 50 MG tablet Take 2 tablets (100 mg total) by mouth at bedtime. 01/30/22   Tonia Ghent, MD  bisoprolol (ZEBETA) 5 MG tablet Take 0.5 tablets (2.5 mg total) by mouth daily. 01/31/19 09/18/19  Belva Crome, MD    Family History Family History  Problem Relation Age of Onset   Lung cancer Brother        x2   Hypertension Father    Heart disease Father    Heart disease Mother    Asthma Sister     Social History Social History   Tobacco Use   Smoking status: Former    Packs/day: 1.50    Years: 10.00    Total pack years: 15.00    Types: Cigarettes    Quit date: 07/09/1985    Years since quitting: 36.6   Smokeless tobacco: Never   Tobacco comments:    1 1/2 ppd x 20 years  Substance Use Topics   Alcohol use: No    Alcohol/week: 0.0 standard drinks of alcohol   Drug use: No     Allergies   Doxycycline, Sulfadiazine, and Sulfa antibiotics   Review of Systems Review of Systems  Constitutional:  Negative for chills and fever.  Eyes:  Negative for discharge and redness.  Genitourinary:  Positive for frequency.  Psychiatric/Behavioral:  Positive for confusion and hallucinations.      Physical Exam Triage Vital Signs ED Triage Vitals  Enc Vitals Group     BP 02/09/22 1527 134/83     Pulse Rate 02/09/22 1527 89     Resp 02/09/22 1527 16  Temp 02/09/22 1527 98.4 F (36.9 C)     Temp Source 02/09/22 1527 Oral     SpO2 02/09/22 1527 96 %     Weight --      Height --      Head Circumference --      Peak Flow --      Pain Score 02/09/22 1522 0     Pain Loc --      Pain Edu? --      Excl. in [place]? --    No data found.  Updated Vital Signs BP 134/83 (BP Location: Left Arm)   Pulse 89   Temp 98.4 F (36.9 C) (Oral)   Resp 16    SpO2 96%      Physical Exam Vitals and nursing note reviewed.  Constitutional:      General: She is not in acute distress.    Appearance: Normal appearance. She is not ill-appearing.  HENT:     Head: Normocephalic and atraumatic.  Eyes:     Conjunctiva/sclera: Conjunctivae normal.  Cardiovascular:     Rate and Rhythm: Normal rate.  Pulmonary:     Effort: Pulmonary effort is normal. No respiratory distress.     Comments: Patient without respiratory distress while wearing supplemental oxygen at 3L/min Neurological:     Mental Status: She is alert.     Comments: Confusion noted in conversing with patient      UC Treatments / Results  Labs (all labs ordered are listed, but only abnormal results are displayed) Labs Reviewed  POCT URINALYSIS DIP (MANUAL ENTRY) - Abnormal; Notable for the following components:      Result Value   Ketones, POC UA trace (5) (*)    Protein Ur, POC =30 (*)    All other components within normal limits    EKG   Radiology No results found.  Procedures Procedures (including critical care time)  Medications Ordered in UC Medications - No data to display  Initial Impression / Assessment and Plan / UC Course  I have reviewed the triage vital signs and the nursing notes.  Pertinent labs & imaging results that were available during my care of the patient were reviewed by me and considered in my medical decision making (see chart for details).    UA ordered and ultimately has no signs concerning for UTI.  Discussed same with daughter who is present with patient as well as power of attorney via telephone.  Initially power of attorney would like patient transported via EMS to further assess current symptoms.  I discussed with POA that patient may be experiencing side effects from polypharmacy however without further labs, possible imaging we could not rule out other cause.  EMS was notified and upon arrival had further discussion with patient,  daughter present, and POA via telephone.  During this conversation EMS is noted to have given personal opinion that patient did not need to be seen in the emergency department and all patient parties agree and patient ultimately was not transported to the emergency department and instead was transported via [facility] [place] [facility].  Medically I feel patient should have been evaluated further and most importantly needed EMS transport due to lack of supplemental oxygen supply and my recommendation after today's visit continues to be emergency department assessment.  Recommend follow-up if symptoms persist.  Final Clinical Impressions(s) / UC Diagnoses   Final diagnoses:  Hallucinations  Confusion  Dementia, unspecified dementia severity, unspecified dementia type, unspecified whether behavioral, psychotic, or mood disturbance  or anxiety [facility])   Discharge Instructions   None    ED Prescriptions   None    PDMP not reviewed this encounter.   Francene Finders, PA-C 02/09/22 1753    Francene Finders, PA-C 02/09/22 1754

## 2022-02-09 NOTE — ED Triage Notes (Signed)
Pt presents with urinary frequency X 2 days.

## 2022-02-09 NOTE — Telephone Encounter (Signed)
Unable to reach [employer]; I called pts daughter [name] [facility] signed) and no answer and I called [name] (DPR signed) I spoke with [name] and she said pt always has frequency of urine but confusion is occurring now see note from [employer]. No available appts at [facility] this afternoon and I scheduled appt at [facility] on [facility] 02/09/22 at 3 PM. UC & ED precautions given and [name] voiced understanding; [name] is in [place] but she will get in touch with her sister to take pt to appt. [name] appreciative of Dr Damita Dunnings and the call. Sending note to Dr Damita Dunnings and Janett Billow CMA.

## 2022-02-09 NOTE — ED Notes (Signed)
Patient is being discharged from the Urgent Care and sent to the Emergency Department via [facility] . Per Provider Ewell Poe, patient is in need of higher level of care due to change in mental status & hallucinations. Patient is aware and verbalizes understanding of plan of care.   Vitals:   02/09/22 1527  BP: 134/83  Pulse: 89  Resp: 16  Temp: 98.4 F (36.9 C)  SpO2: 96%

## 2022-02-12 ENCOUNTER — Other Ambulatory Visit: Payer: Self-pay | Admitting: Radiology

## 2022-02-12 ENCOUNTER — Telehealth (HOSPITAL_COMMUNITY): Payer: Self-pay

## 2022-02-12 DIAGNOSIS — S22000A Wedge compression fracture of unspecified thoracic vertebra, initial encounter for closed fracture: Secondary | ICD-10-CM

## 2022-02-12 NOTE — Telephone Encounter (Signed)
Called pt twice to let her know that the procedure for tomorrow is canceled due to insurance. No answer, left vm. AW

## 2022-02-13 ENCOUNTER — Other Ambulatory Visit: Payer: Self-pay

## 2022-02-13 ENCOUNTER — Emergency Department (HOSPITAL_COMMUNITY): Payer: Medicare PPO

## 2022-02-13 ENCOUNTER — Encounter (HOSPITAL_COMMUNITY): Payer: Self-pay | Admitting: Emergency Medicine

## 2022-02-13 ENCOUNTER — Telehealth: Payer: Self-pay | Admitting: Family Medicine

## 2022-02-13 ENCOUNTER — Emergency Department (HOSPITAL_COMMUNITY)
Admission: EM | Admit: 2022-02-13 | Discharge: 2022-02-13 | Disposition: A | Discharge status: Home / Self Care | Payer: Medicare PPO | Attending: Emergency Medicine | Admitting: Emergency Medicine

## 2022-02-13 ENCOUNTER — Ambulatory Visit (HOSPITAL_COMMUNITY): Admission: RE | Admit: 2022-02-13 | Payer: Medicare PPO | Source: Ambulatory Visit

## 2022-02-13 ENCOUNTER — Encounter (HOSPITAL_COMMUNITY): Payer: Self-pay

## 2022-02-13 DIAGNOSIS — W19XXXA Unspecified fall, initial encounter: Secondary | ICD-10-CM | POA: Diagnosis not present

## 2022-02-13 DIAGNOSIS — Z79899 Other long term (current) drug therapy: Secondary | ICD-10-CM | POA: Diagnosis not present

## 2022-02-13 DIAGNOSIS — R531 Weakness: Secondary | ICD-10-CM | POA: Diagnosis not present

## 2022-02-13 DIAGNOSIS — Z853 Personal history of malignant neoplasm of breast: Secondary | ICD-10-CM | POA: Diagnosis not present

## 2022-02-13 DIAGNOSIS — J449 Chronic obstructive pulmonary disease, unspecified: Secondary | ICD-10-CM | POA: Insufficient documentation

## 2022-02-13 DIAGNOSIS — E039 Hypothyroidism, unspecified: Secondary | ICD-10-CM | POA: Diagnosis not present

## 2022-02-13 DIAGNOSIS — I129 Hypertensive chronic kidney disease with stage 1 through stage 4 chronic kidney disease, or unspecified chronic kidney disease: Secondary | ICD-10-CM | POA: Diagnosis not present

## 2022-02-13 DIAGNOSIS — S199XXA Unspecified injury of neck, initial encounter: Secondary | ICD-10-CM | POA: Diagnosis not present

## 2022-02-13 DIAGNOSIS — Z7902 Long term (current) use of antithrombotics/antiplatelets: Secondary | ICD-10-CM | POA: Insufficient documentation

## 2022-02-13 DIAGNOSIS — S3993XA Unspecified injury of pelvis, initial encounter: Secondary | ICD-10-CM | POA: Diagnosis not present

## 2022-02-13 DIAGNOSIS — N189 Chronic kidney disease, unspecified: Secondary | ICD-10-CM | POA: Insufficient documentation

## 2022-02-13 DIAGNOSIS — S0990XA Unspecified injury of head, initial encounter: Secondary | ICD-10-CM | POA: Diagnosis not present

## 2022-02-13 DIAGNOSIS — E876 Hypokalemia: Secondary | ICD-10-CM | POA: Insufficient documentation

## 2022-02-13 DIAGNOSIS — J90 Pleural effusion, not elsewhere classified: Secondary | ICD-10-CM | POA: Diagnosis not present

## 2022-02-13 LAB — URINALYSIS, ROUTINE W REFLEX MICROSCOPIC
Bilirubin Urine: NEGATIVE
Glucose, UA: NEGATIVE mg/dL
Hgb urine dipstick: NEGATIVE
Ketones, ur: NEGATIVE mg/dL
Leukocytes,Ua: NEGATIVE
Nitrite: NEGATIVE
Protein, ur: NEGATIVE mg/dL
Specific Gravity, Urine: 1.02 (ref 1.005–1.030)
pH: 6 (ref 5.0–8.0)

## 2022-02-13 LAB — CBC WITH DIFFERENTIAL/PLATELET
Abs Immature Granulocytes: 0.01 10*3/uL (ref 0.00–0.07)
Basophils Absolute: 0 10*3/uL (ref 0.0–0.1)
Basophils Relative: 1 %
Eosinophils Absolute: 0.1 10*3/uL (ref 0.0–0.5)
Eosinophils Relative: 2 %
HCT: 33.5 % — ABNORMAL LOW (ref 36.0–46.0)
Hemoglobin: 10.1 g/dL — ABNORMAL LOW (ref 12.0–15.0)
Immature Granulocytes: 0 %
Lymphocytes Relative: 27 %
Lymphs Abs: 1.2 10*3/uL (ref 0.7–4.0)
MCH: 32 pg (ref 26.0–34.0)
MCHC: 30.1 g/dL (ref 30.0–36.0)
MCV: 106 fL — ABNORMAL HIGH (ref 80.0–100.0)
Monocytes Absolute: 0.4 10*3/uL (ref 0.1–1.0)
Monocytes Relative: 10 %
Neutro Abs: 2.7 10*3/uL (ref 1.7–7.7)
Neutrophils Relative %: 60 %
Platelets: 156 10*3/uL (ref 150–400)
RBC: 3.16 MIL/uL — ABNORMAL LOW (ref 3.87–5.11)
RDW: 14.6 % (ref 11.5–15.5)
WBC: 4.4 10*3/uL (ref 4.0–10.5)
nRBC: 0 % (ref 0.0–0.2)

## 2022-02-13 LAB — COMPREHENSIVE METABOLIC PANEL
ALT: 12 U/L (ref 0–44)
AST: 27 U/L (ref 15–41)
Albumin: 3.3 g/dL — ABNORMAL LOW (ref 3.5–5.0)
Alkaline Phosphatase: 37 U/L — ABNORMAL LOW (ref 38–126)
Anion gap: 6 (ref 5–15)
BUN: 15 mg/dL (ref 8–23)
CO2: 43 mmol/L — ABNORMAL HIGH (ref 22–32)
Calcium: 8.7 mg/dL — ABNORMAL LOW (ref 8.9–10.3)
Chloride: 95 mmol/L — ABNORMAL LOW (ref 98–111)
Creatinine, Ser: 0.52 mg/dL (ref 0.44–1.00)
GFR, Estimated: >60 mL/min (ref >60)
Glucose, Bld: 97 mg/dL (ref 70–99)
Potassium: 3.1 mmol/L — ABNORMAL LOW (ref 3.5–5.1)
Sodium: 144 mmol/L (ref 135–145)
Total Bilirubin: 0.5 mg/dL (ref 0.3–1.2)
Total Protein: 6.5 g/dL (ref 6.5–8.1)

## 2022-02-13 LAB — CBG MONITORING, ED: Glucose-Capillary: 95 mg/dL (ref 70–99)

## 2022-02-13 MED ORDER — POTASSIUM CHLORIDE CRYS ER 20 MEQ PO TBCR
60.0000 meq | EXTENDED_RELEASE_TABLET | Freq: Once | ORAL | Status: AC
  Administered 2022-02-13: 60 meq via ORAL
  Filled 2022-02-13: qty 3

## 2022-02-13 NOTE — ED Provider Notes (Signed)
[facility] Provider Note   CSN: [id number] Arrival date & time: 02/13/22  1838     History {Add pertinent medical, surgical, social history, OB history to HPI:1} Chief Complaint  Patient presents with   Failure To Thrive   Weakness   Fall    [name] is a 88 y.o. female.  88 yo F with hx of CKD, hypothyroidism, prior lower GIB, COPD on 3.5L of home oxygen. History obtained per daughter [name]. Says that she fell off of her bed while napping. Says that it is low to the ground approx 15 inches from the ground. Fall was unwitnessed. Says that they were unable to get her off the floor. 911 was called and they recommended taking her to the hospital. Thinks she was on the ground for 10 minutes or so. Seems less active than usual. Has lost several pounds in the past week. Back has 2 compression fractures in it. Today was going to have a procedure to fix it but didn't happen. Thinks that she has been more agitated than usual. No other confusion. Has daily care giver that has been taking care of her during business hours and her daughter stays at home with her overnight. No recent medication changes.    Weakness Fall   Past Medical History:  Diagnosis Date   Abdominal pain 04/05/2017   Allergic rhinitis, cause unspecified    Allergy, unspecified not elsewhere classified    Anemia, unspecified 04/10/2008   Qualifier: Diagnosis of  By: Linna Darner MD, [address] state 03/01/2008   Qualifier: Diagnosis of  By: Council Mechanic MD, Hilaria Ota    Asthma    Breast cancer ([place]) 08/2018   right breast   Breast mass 08/28/2018   Chronic kidney disease    frequency   COMMON MIGRAINE 12/24/2006   Qualifier: Diagnosis of  By: Fuller Plan CMA (AAMA), [place]     COPD (chronic obstructive pulmonary disease) ([place])    Degeneration of intervertebral disc, site unspecified    Diverticulosis of colon (without mention of hemorrhage)    DNR (do not  resuscitate) 09/08/2019   Dysphagia 02/18/2019   Encounter for chronic pain management 07/26/2016   Indication for chronic opioid: chronic back pain Medication and dose:  Oxycodone '10mg'$  TID # pills per month: 90 Last UDS date: 04/15/15 Pain contract signed (Y/N):  yes Date narcotic database last reviewed (include red flags):  05/06/17   Esophageal reflux    Fibromyalgia 12/24/2006   Qualifier: Diagnosis of  By: Fuller Plan CMA (AAMA), [place]     Headache    HTN (hypertension) 06/09/2013   Hyperlipidemia with target LDL less than 70 12/24/2006   Qualifier: Diagnosis of  By: Fuller Plan CMA (AAMA), [place]     Hypokalemia 05/24/2009   Qualifier: Diagnosis of  By: Lacretia Nicks     Hypothyroidism 12/24/2006   Qualifier: Diagnosis of  By: Fuller Plan CMA (AAMA), [place]     Insomnia 12/24/2006   she had trials of antihistamines and TCAs prev w/o effect.      Insomnia, unspecified    Lower GI bleeding 09/09/2019   Myalgia and myositis, unspecified    Need for prophylactic hormone replacement therapy (postmenopausal)    Osteoarthritis of right knee 08/27/2011   R knee pain, s/p arthroscopy 2012 per [name]    Osteoporosis 06/23/2007   Qualifier: Diagnosis of  By: Council Mechanic MD, Hilaria Ota    Osteoporosis, unspecified    Other abnormal blood chemistry  Other and unspecified hyperlipidemia    Other chronic pain    Presence of drug coated stent in right coronary artery 01/30/2019   Stroke [facility])    mini strokes   Syncope 01/24/2017   Thoracic aortic aneurysm ([place]) 04/10/2017   Unspecified essential hypertension    Unspecified hypothyroidism    Vitamin D deficiency 09/26/2016      Home Medications Prior to Admission medications   Medication Sig Start Date End Date Taking? Authorizing Provider  acetaminophen (TYLENOL) 500 MG tablet Take 1,000 mg by mouth every 6 (six) hours as needed for mild pain or headache.    [provider]  albuterol (PROAIR HFA) 108 (90 Base) MCG/ACT inhaler Inhale 1-2 puffs  into the lungs every 6 (six) hours as needed for wheezing or shortness of breath (Okay to fill with Ventolin or albuterol HFA). 10/30/19   Tonia Ghent, MD  alendronate (FOSAMAX) 70 MG tablet Take 1 tablet (70 mg total) by mouth every Monday. 10/02/21   Benay Pike, MD  ALPRAZolam Duanne Moron) 0.5 MG tablet Take 0.5 mg by mouth daily as needed for anxiety or sleep. 01/01/22   [provider]  anastrozole (ARIMIDEX) 1 MG tablet Take 1 tablet (1 mg total) by mouth daily. 10/05/20   Magrinat, Virgie Dad, MD  busPIRone (BUSPAR) 5 MG tablet TAKE ONE TABLET BY MOUTH IN THE MORNING AND IN THE EVENING 01/18/22   Tonia Ghent, MD  Calcium Carbonate (CALTRATE 600 PO) Take 600 mg by mouth at bedtime.    [provider]  cholecalciferol (VITAMIN D3) 25 MCG (1000 UNIT) tablet Take 1,000 Units by mouth at bedtime.    [provider]  clopidogrel (PLAVIX) 75 MG tablet Take 75 mg by mouth daily. 12/13/21   [provider]  donepezil (ARICEPT) 5 MG tablet Take 1 tablet (5 mg total) by mouth at bedtime. 11/28/21   Tonia Ghent, MD  DULoxetine (CYMBALTA) 60 MG capsule TAKE ONE CAPSULE BY MOUTH DAILY 12/22/21   Tonia Ghent, MD  Ensure (ENSURE) Take 237 mLs by mouth 2 (two) times daily between meals.    [provider]  ferrous sulfate (FERROUSUL) 325 (65 FE) MG tablet Take 1 tablet (325 mg total) by mouth daily with breakfast. 08/22/18   Tonia Ghent, MD  fluticasone (FLONASE) 50 MCG/ACT nasal spray PLACE 2 SPRAYS INTO BOTH NOSTRILS DAILY. Patient taking differently: Place 2 sprays into both nostrils daily as needed for allergies. 01/28/20   Tonia Ghent, MD  HYDROcodone-acetaminophen ([place]) 7.5-325 MG tablet Take 1 tablet by mouth every 8 (eight) hours as needed for pain. 01/18/22   [provider]  ipratropium-albuterol (DUONEB) 0.5-2.5 (3) MG/3ML SOLN Take 3 mLs by nebulization 2 (two) times daily. 12/13/21   Florencia Reasons, MD  levothyroxine (SYNTHROID) 112  MCG tablet TAKE ONE TABLET BY MOUTH DAILY Patient taking differently: Take 112 mcg by mouth daily before breakfast. 11/08/21   Tonia Ghent, MD  methocarbamol (ROBAXIN) 500 MG tablet Take 1 tablet (500 mg total) by mouth every 6 (six) hours as needed for muscle spasms. Please contact clinic about follow up with palliative care Patient taking differently: Take 250 mg by mouth every 6 (six) hours as needed for muscle spasms. Please contact clinic about follow up with palliative care 05/24/21   Tonia Ghent, MD  mometasone-formoterol [facility]) 200-5 MCG/ACT AERO Inhale 2 puffs into the lungs 2 (two) times daily. 12/13/21   Florencia Reasons, MD  Multiple Vitamin (MULTIVITAMIN WITH MINERALS)  TABS tablet Take 1 tablet by mouth daily.    [provider]  Multiple Vitamins-Minerals (OCUVITE PRESERVISION PO) Take 1 tablet by mouth at bedtime.    [provider]  nitroGLYCERIN (NITROSTAT) 0.4 MG SL tablet Place 1 tablet (0.4 mg total) under the tongue every 5 (five) minutes as needed for chest pain. 02/23/20   Belva Crome, MD  OXYGEN Inhale 3-3.5 L into the lungs continuous.    [provider]  pantoprazole (PROTONIX) 20 MG tablet TAKE ONE TABLET BY MOUTH TWICE DAILY 10/23/21   Tonia Ghent, MD  polyethylene glycol (MIRALAX / GLYCOLAX) 17 g packet Take 17 g by mouth daily as needed for moderate constipation. 12/13/21   Florencia Reasons, MD  rosuvastatin (CRESTOR) 10 MG tablet Take 1 tablet (10 mg total) by mouth daily. 10/23/21   Tonia Ghent, MD  senna-docusate (SENOKOT-S) 8.6-50 MG tablet Take 1 tablet by mouth at bedtime. 12/13/21   Florencia Reasons, MD  SPIRIVA HANDIHALER 18 MCG inhalation capsule INHALE THE CONTENTS OF ONE CAPSULE VIA HANDIHALER DAILY Patient taking differently: Place 18 mcg into inhaler and inhale daily as needed (shortness of breath). 10/23/21   Tonia Ghent, MD  traZODone (DESYREL) 50 MG tablet Take 2 tablets (100 mg total) by mouth at bedtime. 01/30/22   Tonia Ghent,  MD  bisoprolol (ZEBETA) 5 MG tablet Take 0.5 tablets (2.5 mg total) by mouth daily. 01/31/19 09/18/19  Belva Crome, MD      Allergies    Doxycycline, Sulfadiazine, and Sulfa antibiotics    Review of Systems   Review of Systems  Neurological:  Positive for weakness.    Physical Exam Updated Vital Signs There were no vitals taken for this visit. Physical Exam Vitals and nursing note reviewed.  Constitutional:      General: She is not in acute distress.    Appearance: She is well-developed.  HENT:     Head: Normocephalic and atraumatic.     Right Ear: External ear normal.     Left Ear: External ear normal.     Nose: Nose normal.     Mouth/Throat:     Mouth: Mucous membranes are moist.     Pharynx: Oropharynx is clear.  Eyes:     Extraocular Movements: Extraocular movements intact.     Conjunctiva/sclera: Conjunctivae normal.     Pupils: Pupils are equal, round, and reactive to light.  Cardiovascular:     Rate and Rhythm: Normal rate and regular rhythm.     Heart sounds: No murmur heard. Pulmonary:     Effort: Pulmonary effort is normal. No respiratory distress.     Breath sounds: Normal breath sounds.     Comments: On 3L Wall Abdominal:     General: There is no distension.     Palpations: Abdomen is soft. There is no mass.     Tenderness: There is no abdominal tenderness. There is no guarding.  Musculoskeletal:        General: No swelling.     Cervical back: Neck supple.     Right lower leg: No edema.     Left lower leg: No edema.     Comments: No tenderness to palpation of upper or lower extremities.  No spinal midline step-offs or tenderness in cervical, thoracic, or lumbar spine.  Skin:    General: Skin is warm and dry.     Capillary Refill: Capillary refill takes less than 2 seconds.     Comments: No sacral  ulcers noted.  Neurological:     Mental Status: She is alert.     Comments: Patient moving all 4 extremities.  Cranial nerves II through XII grossly intact.   Psychiatric:        Mood and Affect: Mood normal.     ED Results / Procedures / Treatments   Labs (all labs ordered are listed, but only abnormal results are displayed) Labs Reviewed  COMPREHENSIVE METABOLIC PANEL  CBC WITH DIFFERENTIAL/PLATELET  URINALYSIS, ROUTINE W REFLEX MICROSCOPIC  CBG MONITORING, ED    EKG None  Radiology No results found.  Procedures Procedures  {Document cardiac monitor, telemetry assessment procedure when appropriate:1}  Medications Ordered in ED Medications - No data to display  ED Course/ Medical Decision Making/ A&P                           Medical Decision Making Amount and/or Complexity of Data Reviewed Labs: ordered. Radiology: ordered.   ***  {Document critical care time when appropriate:1} {Document review of labs and clinical decision tools ie heart score, Chads2Vasc2 etc:1}  {Document your independent review of radiology images, and any outside records:1} {Document your discussion with family members, caretakers, and with consultants:1} {Document social determinants of health affecting pt's care:1} {Document your decision making why or why not admission, treatments were needed:1} Final Clinical Impression(s) / ED Diagnoses Final diagnoses:  None    Rx / DC Orders ED Discharge Orders     None

## 2022-02-13 NOTE — Telephone Encounter (Signed)
See mychart message and please get update on patient later this week.  Thanks.

## 2022-02-13 NOTE — ED Triage Notes (Signed)
Patient presents from home due to failure to thrive. The family has noticed a decline in health recently. She has had poor PO intake, weakness, lethargy and fell out of bed twice this week. EMS report her A&O x 2. (A&O x 4 baseline)   EMS vitals: 140/80 BP 80 HR 16 RR 99% 4 L O2 nasal canula (chronic) 123 CBG

## 2022-02-13 NOTE — Discharge Instructions (Signed)
Today you were seen in the emergency department for your fall.    In the emergency department you had a CT scan of your head, neck, hips, and chest which did not show any injuries.    Follow-up with your primary doctor in 2-3 days regarding your visit.    Return immediately to the emergency department if you experience any of the following: Difficulty breathing, fever, recurrent falls, or any other concerning symptoms.    Thank you for visiting our Emergency Department. It was a pleasure taking care of you today.

## 2022-02-14 ENCOUNTER — Other Ambulatory Visit (HOSPITAL_COMMUNITY): Payer: Self-pay | Admitting: Interventional Radiology

## 2022-02-14 DIAGNOSIS — S22050A Wedge compression fracture of T5-T6 vertebra, initial encounter for closed fracture: Secondary | ICD-10-CM

## 2022-02-14 DIAGNOSIS — S22060A Wedge compression fracture of T7-T8 vertebra, initial encounter for closed fracture: Secondary | ICD-10-CM

## 2022-02-15 ENCOUNTER — Telehealth: Payer: Self-pay

## 2022-02-15 NOTE — Telephone Encounter (Signed)
ERROR

## 2022-02-15 NOTE — Telephone Encounter (Signed)
Patient went to hospital 02/13/22 after a fall. She has two fractures that was seen on xray. Patient has been doing okay under the circumstances since hospital visit. She has not had any hallucinations or confusion this week.

## 2022-02-15 NOTE — Telephone Encounter (Signed)
Noted. Thanks.

## 2022-02-16 ENCOUNTER — Other Ambulatory Visit: Payer: Self-pay | Admitting: Family Medicine

## 2022-02-18 ENCOUNTER — Encounter (HOSPITAL_COMMUNITY): Payer: Self-pay

## 2022-02-18 ENCOUNTER — Emergency Department (HOSPITAL_COMMUNITY)
Admission: EM | Admit: 2022-02-18 | Discharge: 2022-02-19 | Disposition: A | Discharge status: Home / Self Care | Payer: Medicare PPO | Source: Home / Self Care | Attending: Emergency Medicine | Admitting: Emergency Medicine

## 2022-02-18 ENCOUNTER — Other Ambulatory Visit: Payer: Self-pay

## 2022-02-18 DIAGNOSIS — Z7401 Bed confinement status: Secondary | ICD-10-CM | POA: Diagnosis not present

## 2022-02-18 DIAGNOSIS — Z7902 Long term (current) use of antithrombotics/antiplatelets: Secondary | ICD-10-CM | POA: Insufficient documentation

## 2022-02-18 DIAGNOSIS — M25551 Pain in right hip: Secondary | ICD-10-CM | POA: Insufficient documentation

## 2022-02-18 DIAGNOSIS — I714 Abdominal aortic aneurysm, without rupture, unspecified: Secondary | ICD-10-CM | POA: Diagnosis not present

## 2022-02-18 DIAGNOSIS — H547 Unspecified visual loss: Secondary | ICD-10-CM | POA: Diagnosis not present

## 2022-02-18 DIAGNOSIS — R0781 Pleurodynia: Secondary | ICD-10-CM | POA: Diagnosis not present

## 2022-02-18 DIAGNOSIS — R1031 Right lower quadrant pain: Secondary | ICD-10-CM | POA: Insufficient documentation

## 2022-02-18 DIAGNOSIS — R590 Localized enlarged lymph nodes: Secondary | ICD-10-CM

## 2022-02-18 DIAGNOSIS — S22000A Wedge compression fracture of unspecified thoracic vertebra, initial encounter for closed fracture: Secondary | ICD-10-CM

## 2022-02-18 DIAGNOSIS — R1011 Right upper quadrant pain: Secondary | ICD-10-CM | POA: Insufficient documentation

## 2022-02-18 DIAGNOSIS — W19XXXA Unspecified fall, initial encounter: Secondary | ICD-10-CM

## 2022-02-18 DIAGNOSIS — M797 Fibromyalgia: Secondary | ICD-10-CM | POA: Diagnosis present

## 2022-02-18 DIAGNOSIS — D638 Anemia in other chronic diseases classified elsewhere: Secondary | ICD-10-CM | POA: Diagnosis present

## 2022-02-18 DIAGNOSIS — Z66 Do not resuscitate: Secondary | ICD-10-CM | POA: Diagnosis present

## 2022-02-18 DIAGNOSIS — R2681 Unsteadiness on feet: Secondary | ICD-10-CM | POA: Diagnosis not present

## 2022-02-18 DIAGNOSIS — Y92003 Bedroom of unspecified non-institutional (private) residence as the place of occurrence of the external cause: Secondary | ICD-10-CM | POA: Insufficient documentation

## 2022-02-18 DIAGNOSIS — I700 Atherosclerosis of aorta: Secondary | ICD-10-CM | POA: Diagnosis not present

## 2022-02-18 DIAGNOSIS — Z96611 Presence of right artificial shoulder joint: Secondary | ICD-10-CM | POA: Insufficient documentation

## 2022-02-18 DIAGNOSIS — F03918 Unspecified dementia, unspecified severity, with other behavioral disturbance: Secondary | ICD-10-CM | POA: Diagnosis not present

## 2022-02-18 DIAGNOSIS — M6281 Muscle weakness (generalized): Secondary | ICD-10-CM | POA: Diagnosis not present

## 2022-02-18 DIAGNOSIS — S2241XD Multiple fractures of ribs, right side, subsequent encounter for fracture with routine healing: Secondary | ICD-10-CM | POA: Diagnosis not present

## 2022-02-18 DIAGNOSIS — Z96651 Presence of right artificial knee joint: Secondary | ICD-10-CM | POA: Insufficient documentation

## 2022-02-18 DIAGNOSIS — Z955 Presence of coronary angioplasty implant and graft: Secondary | ICD-10-CM | POA: Diagnosis not present

## 2022-02-18 DIAGNOSIS — J690 Pneumonitis due to inhalation of food and vomit: Secondary | ICD-10-CM | POA: Diagnosis present

## 2022-02-18 DIAGNOSIS — E039 Hypothyroidism, unspecified: Secondary | ICD-10-CM | POA: Insufficient documentation

## 2022-02-18 DIAGNOSIS — J45909 Unspecified asthma, uncomplicated: Secondary | ICD-10-CM | POA: Insufficient documentation

## 2022-02-18 DIAGNOSIS — S3992XA Unspecified injury of lower back, initial encounter: Secondary | ICD-10-CM | POA: Diagnosis not present

## 2022-02-18 DIAGNOSIS — J9621 Acute and chronic respiratory failure with hypoxia: Secondary | ICD-10-CM | POA: Diagnosis present

## 2022-02-18 DIAGNOSIS — M810 Age-related osteoporosis without current pathological fracture: Secondary | ICD-10-CM | POA: Diagnosis present

## 2022-02-18 DIAGNOSIS — J9622 Acute and chronic respiratory failure with hypercapnia: Secondary | ICD-10-CM | POA: Diagnosis present

## 2022-02-18 DIAGNOSIS — F03A18 Unspecified dementia, mild, with other behavioral disturbance: Secondary | ICD-10-CM | POA: Diagnosis present

## 2022-02-18 DIAGNOSIS — W06XXXD Fall from bed, subsequent encounter: Secondary | ICD-10-CM | POA: Diagnosis present

## 2022-02-18 DIAGNOSIS — Z87891 Personal history of nicotine dependence: Secondary | ICD-10-CM | POA: Diagnosis not present

## 2022-02-18 DIAGNOSIS — Z7189 Other specified counseling: Secondary | ICD-10-CM | POA: Diagnosis not present

## 2022-02-18 DIAGNOSIS — S22088A Other fracture of T11-T12 vertebra, initial encounter for closed fracture: Secondary | ICD-10-CM | POA: Insufficient documentation

## 2022-02-18 DIAGNOSIS — R531 Weakness: Secondary | ICD-10-CM | POA: Diagnosis not present

## 2022-02-18 DIAGNOSIS — E8729 Other acidosis: Secondary | ICD-10-CM | POA: Diagnosis present

## 2022-02-18 DIAGNOSIS — I252 Old myocardial infarction: Secondary | ICD-10-CM | POA: Diagnosis not present

## 2022-02-18 DIAGNOSIS — E782 Mixed hyperlipidemia: Secondary | ICD-10-CM | POA: Diagnosis present

## 2022-02-18 DIAGNOSIS — R0902 Hypoxemia: Secondary | ICD-10-CM | POA: Diagnosis not present

## 2022-02-18 DIAGNOSIS — I110 Hypertensive heart disease with heart failure: Secondary | ICD-10-CM | POA: Diagnosis present

## 2022-02-18 DIAGNOSIS — J449 Chronic obstructive pulmonary disease, unspecified: Secondary | ICD-10-CM | POA: Insufficient documentation

## 2022-02-18 DIAGNOSIS — Z043 Encounter for examination and observation following other accident: Secondary | ICD-10-CM | POA: Diagnosis not present

## 2022-02-18 DIAGNOSIS — Z8673 Personal history of transient ischemic attack (TIA), and cerebral infarction without residual deficits: Secondary | ICD-10-CM | POA: Diagnosis not present

## 2022-02-18 DIAGNOSIS — M419 Scoliosis, unspecified: Secondary | ICD-10-CM | POA: Diagnosis not present

## 2022-02-18 DIAGNOSIS — N189 Chronic kidney disease, unspecified: Secondary | ICD-10-CM | POA: Insufficient documentation

## 2022-02-18 DIAGNOSIS — Z853 Personal history of malignant neoplasm of breast: Secondary | ICD-10-CM | POA: Insufficient documentation

## 2022-02-18 DIAGNOSIS — R278 Other lack of coordination: Secondary | ICD-10-CM | POA: Diagnosis not present

## 2022-02-18 DIAGNOSIS — I2510 Atherosclerotic heart disease of native coronary artery without angina pectoris: Secondary | ICD-10-CM | POA: Diagnosis present

## 2022-02-18 DIAGNOSIS — S2241XA Multiple fractures of ribs, right side, initial encounter for closed fracture: Secondary | ICD-10-CM | POA: Diagnosis not present

## 2022-02-18 DIAGNOSIS — Z801 Family history of malignant neoplasm of trachea, bronchus and lung: Secondary | ICD-10-CM | POA: Diagnosis not present

## 2022-02-18 DIAGNOSIS — I10 Essential (primary) hypertension: Secondary | ICD-10-CM | POA: Diagnosis not present

## 2022-02-18 DIAGNOSIS — R2689 Other abnormalities of gait and mobility: Secondary | ICD-10-CM | POA: Diagnosis not present

## 2022-02-18 DIAGNOSIS — G8929 Other chronic pain: Secondary | ICD-10-CM | POA: Diagnosis not present

## 2022-02-18 DIAGNOSIS — I129 Hypertensive chronic kidney disease with stage 1 through stage 4 chronic kidney disease, or unspecified chronic kidney disease: Secondary | ICD-10-CM | POA: Insufficient documentation

## 2022-02-18 DIAGNOSIS — D259 Leiomyoma of uterus, unspecified: Secondary | ICD-10-CM | POA: Diagnosis not present

## 2022-02-18 DIAGNOSIS — I6381 Other cerebral infarction due to occlusion or stenosis of small artery: Secondary | ICD-10-CM | POA: Diagnosis not present

## 2022-02-18 DIAGNOSIS — I5022 Chronic systolic (congestive) heart failure: Secondary | ICD-10-CM | POA: Diagnosis present

## 2022-02-18 DIAGNOSIS — K219 Gastro-esophageal reflux disease without esophagitis: Secondary | ICD-10-CM | POA: Diagnosis present

## 2022-02-18 DIAGNOSIS — E559 Vitamin D deficiency, unspecified: Secondary | ICD-10-CM | POA: Diagnosis present

## 2022-02-18 DIAGNOSIS — W06XXXA Fall from bed, initial encounter: Secondary | ICD-10-CM | POA: Insufficient documentation

## 2022-02-18 DIAGNOSIS — R0689 Other abnormalities of breathing: Secondary | ICD-10-CM | POA: Diagnosis not present

## 2022-02-18 DIAGNOSIS — R778 Other specified abnormalities of plasma proteins: Secondary | ICD-10-CM | POA: Diagnosis not present

## 2022-02-18 DIAGNOSIS — R41841 Cognitive communication deficit: Secondary | ICD-10-CM | POA: Diagnosis not present

## 2022-02-18 DIAGNOSIS — R9431 Abnormal electrocardiogram [ECG] [EKG]: Secondary | ICD-10-CM | POA: Diagnosis not present

## 2022-02-18 DIAGNOSIS — J441 Chronic obstructive pulmonary disease with (acute) exacerbation: Secondary | ICD-10-CM | POA: Diagnosis present

## 2022-02-18 DIAGNOSIS — R410 Disorientation, unspecified: Secondary | ICD-10-CM | POA: Diagnosis not present

## 2022-02-18 DIAGNOSIS — G9341 Metabolic encephalopathy: Secondary | ICD-10-CM | POA: Diagnosis present

## 2022-02-18 DIAGNOSIS — E876 Hypokalemia: Secondary | ICD-10-CM | POA: Diagnosis present

## 2022-02-18 NOTE — ED Triage Notes (Signed)
Pt was found by daughter with walker oin top of her.  Walker was on top of patient by low lying bed.  Pt c/o right rib cage pain.

## 2022-02-19 ENCOUNTER — Telehealth: Payer: Self-pay

## 2022-02-19 ENCOUNTER — Encounter (HOSPITAL_COMMUNITY): Payer: Self-pay | Admitting: Emergency Medicine

## 2022-02-19 ENCOUNTER — Telehealth (HOSPITAL_COMMUNITY): Payer: Self-pay

## 2022-02-19 ENCOUNTER — Emergency Department (HOSPITAL_COMMUNITY): Payer: Medicare PPO

## 2022-02-19 ENCOUNTER — Ambulatory Visit (HOSPITAL_COMMUNITY): Admission: RE | Admit: 2022-02-19 | Payer: Medicare PPO | Source: Ambulatory Visit

## 2022-02-19 LAB — BASIC METABOLIC PANEL
Anion gap: 6 (ref 5–15)
BUN: 10 mg/dL (ref 8–23)
CO2: 43 mmol/L — ABNORMAL HIGH (ref 22–32)
Calcium: 8.8 mg/dL — ABNORMAL LOW (ref 8.9–10.3)
Chloride: 96 mmol/L — ABNORMAL LOW (ref 98–111)
Creatinine, Ser: 0.56 mg/dL (ref 0.44–1.00)
GFR, Estimated: >60 mL/min (ref >60)
Glucose, Bld: 124 mg/dL — ABNORMAL HIGH (ref 70–99)
Potassium: 3.4 mmol/L — ABNORMAL LOW (ref 3.5–5.1)
Sodium: 145 mmol/L (ref 135–145)

## 2022-02-19 LAB — CK: Total CK: 31 U/L — ABNORMAL LOW (ref 38–234)

## 2022-02-19 LAB — CBC
HCT: 34.8 % — ABNORMAL LOW (ref 36.0–46.0)
Hemoglobin: 10.4 g/dL — ABNORMAL LOW (ref 12.0–15.0)
MCH: 32.3 pg (ref 26.0–34.0)
MCHC: 29.9 g/dL — ABNORMAL LOW (ref 30.0–36.0)
MCV: 108.1 fL — ABNORMAL HIGH (ref 80.0–100.0)
Platelets: 146 10*3/uL — ABNORMAL LOW (ref 150–400)
RBC: 3.22 MIL/uL — ABNORMAL LOW (ref 3.87–5.11)
RDW: 14.6 % (ref 11.5–15.5)
WBC: 5.1 10*3/uL (ref 4.0–10.5)
nRBC: 0 % (ref 0.0–0.2)

## 2022-02-19 MED ORDER — METHOCARBAMOL 500 MG PO TABS: 500.0000 mg | ORAL_TABLET | Freq: Two times a day (BID) | ORAL | 0 refills | Status: DC

## 2022-02-19 MED ORDER — FENTANYL CITRATE PF 50 MCG/ML IJ SOSY
12.5000 ug | PREFILLED_SYRINGE | Freq: Once | INTRAMUSCULAR | Status: AC
  Administered 2022-02-19: 12.5 ug via INTRAVENOUS
  Filled 2022-02-19: qty 1

## 2022-02-19 MED ORDER — LIDOCAINE 5 % EX PTCH: 1.0000 | MEDICATED_PATCH | Freq: Every day | CUTANEOUS | 0 refills | Status: DC | PRN

## 2022-02-19 MED ORDER — ACETAMINOPHEN 325 MG PO TABS
650.0000 mg | ORAL_TABLET | Freq: Once | ORAL | Status: AC
  Administered 2022-02-19: 650 mg via ORAL
  Filled 2022-02-19: qty 2

## 2022-02-19 MED ORDER — ONDANSETRON HCL 4 MG/2ML IJ SOLN
4.0000 mg | Freq: Once | INTRAMUSCULAR | Status: AC
  Administered 2022-02-19: 4 mg via INTRAVENOUS
  Filled 2022-02-19: qty 2

## 2022-02-19 MED ORDER — IOHEXOL 300 MG/ML  SOLN
100.0000 mL | Freq: Once | INTRAMUSCULAR | Status: AC | PRN
  Administered 2022-02-19: 100 mL via INTRAVENOUS

## 2022-02-19 MED ORDER — IBUPROFEN 600 MG PO TABS: 600.0000 mg | ORAL_TABLET | Freq: Four times a day (QID) | ORAL | 0 refills | Status: DC | PRN

## 2022-02-19 MED ORDER — IBUPROFEN 200 MG PO TABS
600.0000 mg | ORAL_TABLET | Freq: Once | ORAL | Status: AC
  Administered 2022-02-19: 600 mg via ORAL
  Filled 2022-02-19: qty 3

## 2022-02-19 MED ORDER — LIDOCAINE 5 % EX PTCH
1.0000 | MEDICATED_PATCH | CUTANEOUS | Status: DC
  Administered 2022-02-19: 1 via TRANSDERMAL
  Filled 2022-02-19: qty 1

## 2022-02-19 MED ORDER — SODIUM CHLORIDE 0.9 % IV BOLUS
1000.0000 mL | Freq: Once | INTRAVENOUS | Status: AC
  Administered 2022-02-19: 1000 mL via INTRAVENOUS

## 2022-02-19 NOTE — Discharge Instructions (Addendum)
It was a pleasure caring for you today in the emergency department.  Please return to the emergency department for any worsening or worrisome symptoms.  Please follow-up with your back specialist regarding your compression fractures. Follow up with your PCP regarding enlarged lymph nodes.

## 2022-02-19 NOTE — ED Notes (Addendum)
PT refuse BP

## 2022-02-19 NOTE — Telephone Encounter (Signed)
     Patient  visit on 8/14  at [facility]  was for fall  Have you been able to follow up with your primary care physician? yes  The patient was or was not able to obtain any needed medicine or equipment.yes  Are there diet recommendations that you are having difficulty following?na  Patient expresses understanding of discharge instructions and education provided has no other needs at this time. yes     [facility]  [phone] [address], [place], [place] [address] Phone: [phone] Email: [email or web address]

## 2022-02-19 NOTE — Telephone Encounter (Signed)
Pt called to cancel appt bc the pt fell. Canceled for now. Pt's daughter will call back to reschedule. AW

## 2022-02-19 NOTE — ED Provider Notes (Signed)
[facility] DEPT Provider Note   CSN: [id number] Arrival date & time: 02/18/22  2300     History  Chief Complaint  Patient presents with   [name] is a 88 y.o. female.  Patient as above with significant medical history as below, including CKD, COPD, DDD, DNR, HTN, fibromyalgia who presents to the ED with complaint of fall.  Patient reports she fell out of her bed this evening onto the floor.  Does not believe she had LOC.  Unsure if she hit her head.  Fell onto her right side.  She was attempting to transition from her bed to her walker and slipped to the ground, thinks she may have gotten tangled up in her bed sheets.  Walker fell on top of her.  She has severe pain to her right hip region and right sided chest wall.  Feels she is breathing her typical level, she is on 3.5 L nasal cannula 24/7.  No nausea or vomiting.  No numbness or tingling.  No headache.  No vision changes.  Denies use of blood thinners.  Reports she was in her normal state of health prior to the fall     Past Medical History:  Diagnosis Date   Abdominal pain 04/05/2017   Allergic rhinitis, cause unspecified    Allergy, unspecified not elsewhere classified    Anemia, unspecified 04/10/2008   Qualifier: Diagnosis of  By: Linna Darner MD, [place] state 03/01/2008   Qualifier: Diagnosis of  By: Council Mechanic MD, Hilaria Ota    Asthma    Breast cancer ([place]) 08/2018   right breast   Breast mass 08/28/2018   Chronic kidney disease    frequency   COMMON MIGRAINE 12/24/2006   Qualifier: Diagnosis of  By: Fuller Plan CMA (AAMA), [place]     COPD (chronic obstructive pulmonary disease) ([place])    Degeneration of intervertebral disc, site unspecified    Diverticulosis of colon (without mention of hemorrhage)    DNR (do not resuscitate) 09/08/2019   Dysphagia 02/18/2019   Encounter for chronic pain management 07/26/2016   Indication for chronic opioid: chronic back  pain Medication and dose:  Oxycodone '10mg'$  TID # pills per month: 90 Last UDS date: 04/15/15 Pain contract signed (Y/N):  yes Date narcotic database last reviewed (include red flags):  05/06/17   Esophageal reflux    Fibromyalgia 12/24/2006   Qualifier: Diagnosis of  By: Fuller Plan CMA (AAMA), [place]     Headache    HTN (hypertension) 06/09/2013   Hyperlipidemia with target LDL less than 70 12/24/2006   Qualifier: Diagnosis of  By: Fuller Plan CMA (AAMA), [place]     Hypokalemia 05/24/2009   Qualifier: Diagnosis of  By: Lacretia Nicks     Hypothyroidism 12/24/2006   Qualifier: Diagnosis of  By: Fuller Plan CMA (AAMA), [place]     Insomnia 12/24/2006   she had trials of antihistamines and TCAs prev w/o effect.      Insomnia, unspecified    Lower GI bleeding 09/09/2019   Myalgia and myositis, unspecified    Need for prophylactic hormone replacement therapy (postmenopausal)    Osteoarthritis of right knee 08/27/2011   R knee pain, s/p arthroscopy 2012 per [name]    Osteoporosis 06/23/2007   Qualifier: Diagnosis of  By: Council Mechanic MD, Hilaria Ota    Osteoporosis, unspecified    Other abnormal blood chemistry    Other and unspecified hyperlipidemia    Other chronic  pain    Presence of drug coated stent in right coronary artery 01/30/2019   Stroke [facility])    mini strokes   Syncope 01/24/2017   Thoracic aortic aneurysm ([place]) 04/10/2017   Unspecified essential hypertension    Unspecified hypothyroidism    Vitamin D deficiency 09/26/2016    Past Surgical History:  Procedure Laterality Date   BACK SURGERY     breast cystectomy     CORONARY/GRAFT ACUTE MI REVASCULARIZATION N/A 01/29/2019   Procedure: CORONARY/GRAFT ACUTE MI REVASCULARIZATION;  Surgeon: Belva Crome, MD;  Location: [facility];  Service: Cardiovascular;  Laterality: N/A;   JOINT REPLACEMENT     shoulder right   KNEE ARTHROSCOPY     Right knee 2012   MASS EXCISION Left 04/07/2015   Procedure: MINOR EXCISION OF MASS LEFT SMALL FINGER;   Surgeon: Leanora Cover, MD;  Location: [place];  Service: Orthopedics;  Laterality: Left;   SHOULDER SURGERY     TOTAL KNEE ARTHROPLASTY  12/11/2011   Procedure: TOTAL KNEE ARTHROPLASTY;  Surgeon: Johnny Bridge, MD;  Location: [place];  Service: Orthopedics;  Laterality: Right;   UMBILICAL HERNIA REPAIR N/A 06/09/2013   Procedure: EXPLORATIORY LAPAROTOMY, HERNIA REPAIR UMBILICAL, INSERTION OF MESH;  Surgeon: Haywood Lasso, MD;  Location: [place];  Service: General;  Laterality: N/A;     The history is provided by the patient. No language interpreter was used.  Fall Associated symptoms include chest pain and abdominal pain. Pertinent negatives include no headaches and no shortness of breath.       Home Medications Prior to Admission medications   Medication Sig Start Date End Date Taking? Authorizing Provider  ibuprofen (ADVIL) 600 MG tablet Take 1 tablet (600 mg total) by mouth every 6 (six) hours as needed. 02/19/22  Yes Wynona Dove A, DO  lidocaine (LIDODERM) 5 % Place 1 patch onto the skin daily as needed. Remove & Discard patch within 12 hours or as directed by MD 02/19/22  Yes Jeanell Sparrow, DO  methocarbamol (ROBAXIN) 500 MG tablet Take 1 tablet (500 mg total) by mouth 2 (two) times daily for 5 days. 02/19/22 02/24/22 Yes Jeanell Sparrow, DO  acetaminophen (TYLENOL) 500 MG tablet Take 1,000 mg by mouth every 6 (six) hours as needed for mild pain or headache.    [provider]  albuterol (PROAIR HFA) 108 (90 Base) MCG/ACT inhaler Inhale 1-2 puffs into the lungs every 6 (six) hours as needed for wheezing or shortness of breath (Okay to fill with Ventolin or albuterol HFA). 10/30/19   Tonia Ghent, MD  alendronate (FOSAMAX) 70 MG tablet Take 1 tablet (70 mg total) by mouth every Monday. 10/02/21   Benay Pike, MD  ALPRAZolam Duanne Moron) 0.5 MG tablet Take 0.5 mg by mouth daily as needed for anxiety or sleep. 01/01/22   [provider]  anastrozole  (ARIMIDEX) 1 MG tablet Take 1 tablet (1 mg total) by mouth daily. 10/05/20   Magrinat, Virgie Dad, MD  busPIRone (BUSPAR) 5 MG tablet TAKE ONE TABLET BY MOUTH IN THE MORNING AND IN THE EVENING Patient taking differently: Take 5 mg by mouth 2 (two) times daily. 01/18/22   Tonia Ghent, MD  Calcium Carbonate (CALTRATE 600 PO) Take 600 mg by mouth at bedtime.    [provider]  cholecalciferol (VITAMIN D3) 25 MCG (1000 UNIT) tablet Take 1,000 Units by mouth at bedtime.    [provider]  clopidogrel (PLAVIX) 75 MG tablet  Take 75 mg by mouth daily. 12/13/21   [provider]  donepezil (ARICEPT) 5 MG tablet TAKE ONE TABLET BY MOUTH AT BEDTIME 02/16/22   Tonia Ghent, MD  DULoxetine (CYMBALTA) 60 MG capsule TAKE ONE CAPSULE BY MOUTH DAILY Patient taking differently: Take 60 mg by mouth daily. 12/22/21   Tonia Ghent, MD  Ensure (ENSURE) Take 237 mLs by mouth 2 (two) times daily between meals.    [provider]  ferrous sulfate (FERROUSUL) 325 (65 FE) MG tablet Take 1 tablet (325 mg total) by mouth daily with breakfast. 08/22/18   Tonia Ghent, MD  fluticasone (FLONASE) 50 MCG/ACT nasal spray PLACE 2 SPRAYS INTO BOTH NOSTRILS DAILY. Patient taking differently: Place 2 sprays into both nostrils daily as needed for allergies. 01/28/20   Tonia Ghent, MD  gabapentin (NEURONTIN) 100 MG capsule Take by mouth. 12/22/21   [provider]  HYDROcodone-acetaminophen (NORCO) 7.5-325 MG tablet Take 1 tablet by mouth every 8 (eight) hours as needed for pain. 01/18/22   [provider]  hydrOXYzine (ATARAX) 10 MG tablet Take 10 mg by mouth 2 (two) times daily. 12/22/21   [provider]  ipratropium-albuterol (DUONEB) 0.5-2.5 (3) MG/3ML SOLN Take 3 mLs by nebulization 2 (two) times daily. 12/13/21   Florencia Reasons, MD  levothyroxine (SYNTHROID) 112 MCG tablet TAKE ONE TABLET BY MOUTH DAILY Patient taking differently: Take 112 mcg by mouth daily before  breakfast. 11/08/21   Tonia Ghent, MD  mometasone-formoterol (DULERA) 200-5 MCG/ACT AERO Inhale 2 puffs into the lungs 2 (two) times daily. 12/13/21   Florencia Reasons, MD  Multiple Vitamin (MULTIVITAMIN WITH MINERALS) TABS tablet Take 1 tablet by mouth daily.    [provider]  Multiple Vitamins-Minerals (OCUVITE PRESERVISION PO) Take 1 tablet by mouth at bedtime.    [provider]  nitroGLYCERIN (NITROSTAT) 0.4 MG SL tablet Place 1 tablet (0.4 mg total) under the tongue every 5 (five) minutes as needed for chest pain. 02/23/20   Belva Crome, MD  OXYGEN Inhale 3-3.5 L into the lungs continuous.    [provider]  pantoprazole (PROTONIX) 20 MG tablet TAKE ONE TABLET BY MOUTH TWICE DAILY Patient taking differently: Take 20 mg by mouth 2 (two) times daily. 10/23/21   Tonia Ghent, MD  polyethylene glycol (MIRALAX / GLYCOLAX) 17 g packet Take 17 g by mouth daily as needed for moderate constipation. 12/13/21   Florencia Reasons, MD  rosuvastatin (CRESTOR) 10 MG tablet Take 1 tablet (10 mg total) by mouth daily. 10/23/21   Tonia Ghent, MD  senna-docusate (SENOKOT-S) 8.6-50 MG tablet Take 1 tablet by mouth at bedtime. 12/13/21   Florencia Reasons, MD  SPIRIVA HANDIHALER 18 MCG inhalation capsule INHALE THE CONTENTS OF ONE CAPSULE VIA HANDIHALER DAILY Patient taking differently: Place 18 mcg into inhaler and inhale daily as needed (shortness of breath). 10/23/21   Tonia Ghent, MD  traZODone (DESYREL) 50 MG tablet Take 2 tablets (100 mg total) by mouth at bedtime. 01/30/22   Tonia Ghent, MD  bisoprolol (ZEBETA) 5 MG tablet Take 0.5 tablets (2.5 mg total) by mouth daily. 01/31/19 09/18/19  Belva Crome, MD      Allergies    Doxycycline, Sulfadiazine, and Sulfa antibiotics    Review of Systems   Review of Systems  Constitutional:  Negative for activity change and fever.  HENT:  Negative for facial swelling and trouble swallowing.   Eyes:  Negative for discharge and redness.  Respiratory:  Negative for cough and shortness of breath.   Cardiovascular:  Positive for chest pain. Negative for palpitations.  Gastrointestinal:  Positive for abdominal pain. Negative for nausea.  Genitourinary:  Negative for dysuria and flank pain.  Musculoskeletal:  Positive for arthralgias. Negative for back pain and gait problem.  Skin:  Negative for pallor and rash.  Neurological:  Negative for syncope and headaches.    Physical Exam Updated Vital Signs BP (!) 165/88   Pulse (!) 107   Temp 97.9 F (36.6 C) (Oral)   Resp 20   Ht '5\' 1"'$  (1.549 m)   Wt 52.2 kg   SpO2 100%   BMI 21.74 kg/m  Physical Exam Vitals and nursing note reviewed.  Constitutional:      General: She is not in acute distress.    Appearance: Normal appearance.  HENT:     Head: Normocephalic and atraumatic. No raccoon eyes, Battle's sign, right periorbital erythema or left periorbital erythema.     Jaw: There is normal jaw occlusion. No trismus or tenderness.     Right Ear: Tympanic membrane and external ear normal. No mastoid tenderness.     Left Ear: Tympanic membrane and external ear normal. No mastoid tenderness.     Nose: Nose normal.     Mouth/Throat:     Mouth: Mucous membranes are moist.  Eyes:     General: No scleral icterus.       Right eye: No discharge.        Left eye: No discharge.     Extraocular Movements: Extraocular movements intact.     Pupils: Pupils are equal, round, and reactive to light.  Cardiovascular:     Rate and Rhythm: Normal rate and regular rhythm.     Pulses: Normal pulses.     Heart sounds: Normal heart sounds.  Pulmonary:     Effort: Pulmonary effort is normal. No tachypnea, accessory muscle usage or respiratory distress.     Breath sounds: Normal breath sounds.  Chest:    Abdominal:     General: Abdomen is flat.     Tenderness: There is abdominal tenderness.    Musculoskeletal:        General: Normal range of motion.     Cervical back: Full passive  range of motion without pain and normal range of motion.     Right lower leg: No edema.     Left lower leg: No edema.     Comments: No midline spinous process tenderness to palpation or percussion, no crepitus or step-off.   Pelvis stable to AP pressure however pain noted to right hip/right iliac crest area on palpation. Some ttp along lower ribs to right side as well.   DP pulses 2+ b/l, radial pulses are 2+ b/l     Skin:    General: Skin is warm and dry.     Capillary Refill: Capillary refill takes less than 2 seconds.  Neurological:     Mental Status: She is alert.  Psychiatric:        Mood and Affect: Mood normal.        Behavior: Behavior normal.     ED Results / Procedures / Treatments   Labs (all labs ordered are listed, but only abnormal results are displayed) Labs Reviewed  BASIC METABOLIC PANEL - Abnormal; Notable for the following components:      Result Value   Potassium 3.4 (*)    Chloride 96 (*)    CO2 43 (*)  Glucose, Bld 124 (*)    Calcium 8.8 (*)    All other components within normal limits  CK - Abnormal; Notable for the following components:   Total CK 31 (*)    All other components within normal limits  CBC - Abnormal; Notable for the following components:   RBC 3.22 (*)    Hemoglobin 10.4 (*)    HCT 34.8 (*)    MCV 108.1 (*)    MCHC 29.9 (*)    Platelets 146 (*)    All other components within normal limits    EKG EKG Interpretation  Date/Time:  Sunday February 18 2022 23:13:52 EDT Ventricular Rate:  82 PR Interval:  171 QRS Duration: 126 QT Interval:  392 QTC Calculation: 458 R Axis:   58 Text Interpretation: Sinus rhythm Nonspecific intraventricular conduction delay Anterior infarct, old Nonspecific T abnormalities, lateral leads similar to prior no stemi Interpretation limited secondary to artifact Confirmed by Wynona Dove ([id number]) on 02/19/2022 12:57:09 AM  Radiology CT Head Wo Contrast  Result Date: 02/19/2022 CLINICAL DATA:   Unwitnessed fall, possible head trauma, complains of pain in the right ribcage. History of right breast cancer. EXAM: CT HEAD WITHOUT CONTRAST CT CERVICAL SPINE WITHOUT CONTRAST CT CHEST, ABDOMEN AND PELVIS WITH CONTRAST TECHNIQUE: Contiguous axial images were obtained from the base of the skull through the vertex without intravenous contrast. Multidetector CT imaging of the cervical spine was performed without intravenous contrast. Multiplanar CT image reconstructions were also generated. Multidetector CT imaging of the chest, abdomen and pelvis was performed following the standard protocol during bolus administration of intravenous contrast. RADIATION DOSE REDUCTION: This exam was performed according to the departmental dose-optimization program which includes automated exposure control, adjustment of the mA and/or kV according to patient size and/or use of iterative reconstruction technique. CONTRAST:  114m OMNIPAQUE IOHEXOL 300 MG/ML  SOLN COMPARISON:  PA and lateral chest and CTA chest, abdomen and pelvis 12/06/2021, chest CT with contrast 09/14/2019, head CT 12/06/2021, and cervical spine CT 12/06/2021. FINDINGS: CT HEAD FINDINGS Brain: There is moderately advanced global atrophy with atrophic ventriculomegaly, moderate to severe small vessel disease of the cerebral white matter, and stable appearance of bifrontal subdural hygromas. There are small chronic lacunar infarctions in both gangliocapsular areas. No asymmetry is seen concerning for an acute infarct, hemorrhage or mass. Chronic lacunar infarct in the superior left cerebellar hemisphere is also redemonstrated. Vascular: There is carotid atherosclerosis without hyperdense central vessels. Skull: Osteopenia. Negative for fracture or focal lesion. No visible scalp hematoma. Sinuses/Orbits: Fluid is again noted posteriorly in the left sphenoid air cell. There is increased patchy opacification of the ethmoid air cells. The frontal and visualized maxillary  sinuses are clear. There is moderate patchy fluid increased throughout the right mastoid air cells with no right middle ear opacity. There has developed extensive fluid throughout the left mastoid air cells and middle ear cavity. No bone destruction is seen. Old lens replacements. Other: None. CT CERVICAL FINDINGS Alignment: Slight dextroscoliosis.  Otherwise normal. Skull base and vertebrae: Osteopenia. No fracture or primary bone lesion is seen. Soft tissues and spinal canal: No prevertebral fluid or swelling. No visible canal hematoma. There is bilateral carotid atherosclerosis. Disc levels: Moderate disc space loss again at C4-5 and C5-6. Trace marginal osteophytes. No significant soft tissue or bony encroachment on the thecal sac is seen. There is a mild nonstenosing posterior disc bulge again noted at C2-3. Facet joint and uncinate hypertrophy left-greater-than-right is seen at most levels, with multilevel left-sided foraminal  stenosis greatest at C4-5 and C5-6. There is mild narrowing and spurring at the anterior atlantodental joint. Other:  None. CT CHEST FINDINGS Cardiovascular: Slightly dilated ascending aorta again measures 4.2 cm. There is diffuse aortic atherosclerosis with descending segment tortuosity, stable arch segment measurement 3.8 cm and ulcerative atheromatous plaque in the anterior aspect of the proximal arch, as before. The great vessels show scattered calcification without stenosis or aneurysm. There is no aortic or great vessel dissection. There is mild cardiomegaly with three-vessel calcific CAD. Pulmonary arteries are normal caliber and centrally clear. Pulmonary veins are decompressed. Mediastinum/Nodes: There is no intrathoracic adenopathy or focal tracheal disease. There is no esophageal thickening. Thyroid gland is small and atrophic. There is right axillary adenopathy with largest lymph node measuring 2.3 x 1.4 cm on 1:14. There are 2 adjacent mildly prominent left axillary lymph  nodes not seen previously. Largest of these is 1.6 x 1.1 cm on 1:15. Clinical correlation advised. These lymph nodes were not enlarged on 09/14/2019 and left axillary adenopathy is new since. Lungs/Pleura: Emphysematous and scarring changes are again shown. There is chronic atelectasis or scarring in the posterolateral right lower lobe superimposed on this there is more confluent increased opacity in the area which could be increased regional atelectasis or small pneumonia. Diffuse central bronchial thickening is seen as well as increased amount of scattered small posterior basal bronchiolar impactions. There are increased posterior basal left lower lobe tree-in-bud interstitial changes consistent with small airways disease/bronchiolitis. Findings could be due to interval chronic aspiration. Remaining lungs are generally clear. Musculoskeletal: Osteopenia, levoscoliosis and degenerative changes of the thoracic spine are again noted. There is again noted mild chronic anterior wedging of the T3 and T11 vertebral bodies. Today there is new demonstration of a mild upper plate anterior wedge compression fracture of the T6 vertebral body with 15% anterior and 5% posterior height loss without retropulsion, and a severe compression fracture of T7 body with posterosuperior cortical retropulsion up to 4 mm mildly encroaching on the ventral cord surface and narrowing the AP diameter of the thecal sac to 8.5 mm, with moderate to severe bilateral T7-8 foraminal stenosis secondarily. There is no displaced fracture in the visualized shoulder girdles, spinal posterior elements of the sternum. There are mildly displaced fractures of the right posterolateral ninth and tenth ribs. No other displaced rib fracture is seen. CT ABDOMEN PELVIS FINDINGS Hepatobiliary: No hepatic injury or perihepatic hematoma. Gallbladder is unremarkable Pancreas: Atrophic and otherwise unremarkable. Spleen: No splenic injury or perisplenic hematoma.  Adrenals/Urinary Tract: No adrenal or renal mass enhancement. There is a small cyst in the posteroinferior left kidney. There is no urinary stone or obstruction. No thickening of the bladder wall. Stomach/Bowel: No dilatation or wall thickening. An appendix is not seen. Vascular/Lymphatic: Extensive aortoiliac calcific plaque noted, fusiform dilatation in the infrarenal aorta up to 4.5 cm which was seen previously, unchanged. No adjacent fat stranding is evident or other evidence of impending rupture. The portal vein is patent. No adenopathy is seen. Reproductive: Massive fibroid uterus again noted: Today's exam measuring 15.4 x 14.3 by 13.5 cm. Other: There are mild mesenteric and body wall congestive features which were noted previously. There is trace ascites in the posterior pelvis which was also seen previously. There is no free air, free hemorrhage or abscess. Musculoskeletal: Osteopenia, dextroscoliosis and degenerative change lumbar spine. No visible regional skeletal fracture or compression injury. IMPRESSION: 1. No acute intracranial CT findings or depressed skull fractures. 2. Chronic atrophy and small-vessel changes with stable  bifrontal subdural hygromas. 3. Interval worsening right mastoid effusion with extensive diffuse opacification of left mastoid air cells and left middle ear cavity. Correlate clinically for otomastoiditis. 4. Worsening sinus disease in the ethmoids with continued fluid in the sphenoid sinus. 5. Osteopenia and degenerative change without appreciable cervical fracture. 6. Increased opacity in the right lower lobe which could be due to interval increased atelectasis, pulmonary contusions or infiltrates. 7. Increased scattered small posterior lower lobe bronchiolar impactions and increasing left lower lobe tree-in-bud interstitial change consistent with small airways disease. Consider aspiration precautions. 8. Mildly displaced fractures of the posterolateral right ninth ribs with  acute appearance. 9. Mild upper plate anterior wedge compression fracture of T6 vertebral body new from 12/06/2021, possibly acute. 10. Severe anterior wedge compression fracture of the T7 vertebral body with 4 mm retropulsion encroaching somewhat on the ventral cord surface with mild spinal stenosis and with biforaminal stenosis at T7-8. This could be acute or subacute but it was not seen on 12/06/2021. There is only slight thickening of the paraspinal soft tissues at this level compared to the previous exam. 11. Right axillary adenopathy again noted with interval development of mildly enlarged left axillary lymph nodes. Clinical correlation for lymphoproliferative disease or recurrent right breast cancer versus infectious process recommended. 12. Cardiomegaly with aortic and coronary artery atherosclerosis, 4.2 cm stable dilatation of the ascending aorta, and up to 4.5 cm diffuse aneurysmal dilatation in the abdominal aorta. Recommend imaging follow-up at six-month intervals and vascular consultation if not already done. 13. Again noted atheromatous ulcerative plaque anteriorly in the proximal ascending aorta. No dissection. 14. Massive fibroid uterus. Electronically Signed   By: Telford Nab M.D.   On: 02/19/2022 04:18   CT Cervical Spine Wo Contrast  Result Date: 02/19/2022 CLINICAL DATA:  Unwitnessed fall, possible head trauma, complains of pain in the right ribcage. History of right breast cancer. EXAM: CT HEAD WITHOUT CONTRAST CT CERVICAL SPINE WITHOUT CONTRAST CT CHEST, ABDOMEN AND PELVIS WITH CONTRAST TECHNIQUE: Contiguous axial images were obtained from the base of the skull through the vertex without intravenous contrast. Multidetector CT imaging of the cervical spine was performed without intravenous contrast. Multiplanar CT image reconstructions were also generated. Multidetector CT imaging of the chest, abdomen and pelvis was performed following the standard protocol during bolus administration  of intravenous contrast. RADIATION DOSE REDUCTION: This exam was performed according to the departmental dose-optimization program which includes automated exposure control, adjustment of the mA and/or kV according to patient size and/or use of iterative reconstruction technique. CONTRAST:  161m OMNIPAQUE IOHEXOL 300 MG/ML  SOLN COMPARISON:  PA and lateral chest and CTA chest, abdomen and pelvis 12/06/2021, chest CT with contrast 09/14/2019, head CT 12/06/2021, and cervical spine CT 12/06/2021. FINDINGS: CT HEAD FINDINGS Brain: There is moderately advanced global atrophy with atrophic ventriculomegaly, moderate to severe small vessel disease of the cerebral white matter, and stable appearance of bifrontal subdural hygromas. There are small chronic lacunar infarctions in both gangliocapsular areas. No asymmetry is seen concerning for an acute infarct, hemorrhage or mass. Chronic lacunar infarct in the superior left cerebellar hemisphere is also redemonstrated. Vascular: There is carotid atherosclerosis without hyperdense central vessels. Skull: Osteopenia. Negative for fracture or focal lesion. No visible scalp hematoma. Sinuses/Orbits: Fluid is again noted posteriorly in the left sphenoid air cell. There is increased patchy opacification of the ethmoid air cells. The frontal and visualized maxillary sinuses are clear. There is moderate patchy fluid increased throughout the right mastoid air cells with no right  middle ear opacity. There has developed extensive fluid throughout the left mastoid air cells and middle ear cavity. No bone destruction is seen. Old lens replacements. Other: None. CT CERVICAL FINDINGS Alignment: Slight dextroscoliosis.  Otherwise normal. Skull base and vertebrae: Osteopenia. No fracture or primary bone lesion is seen. Soft tissues and spinal canal: No prevertebral fluid or swelling. No visible canal hematoma. There is bilateral carotid atherosclerosis. Disc levels: Moderate disc space loss  again at C4-5 and C5-6. Trace marginal osteophytes. No significant soft tissue or bony encroachment on the thecal sac is seen. There is a mild nonstenosing posterior disc bulge again noted at C2-3. Facet joint and uncinate hypertrophy left-greater-than-right is seen at most levels, with multilevel left-sided foraminal stenosis greatest at C4-5 and C5-6. There is mild narrowing and spurring at the anterior atlantodental joint. Other:  None. CT CHEST FINDINGS Cardiovascular: Slightly dilated ascending aorta again measures 4.2 cm. There is diffuse aortic atherosclerosis with descending segment tortuosity, stable arch segment measurement 3.8 cm and ulcerative atheromatous plaque in the anterior aspect of the proximal arch, as before. The great vessels show scattered calcification without stenosis or aneurysm. There is no aortic or great vessel dissection. There is mild cardiomegaly with three-vessel calcific CAD. Pulmonary arteries are normal caliber and centrally clear. Pulmonary veins are decompressed. Mediastinum/Nodes: There is no intrathoracic adenopathy or focal tracheal disease. There is no esophageal thickening. Thyroid gland is small and atrophic. There is right axillary adenopathy with largest lymph node measuring 2.3 x 1.4 cm on 1:14. There are 2 adjacent mildly prominent left axillary lymph nodes not seen previously. Largest of these is 1.6 x 1.1 cm on 1:15. Clinical correlation advised. These lymph nodes were not enlarged on 09/14/2019 and left axillary adenopathy is new since. Lungs/Pleura: Emphysematous and scarring changes are again shown. There is chronic atelectasis or scarring in the posterolateral right lower lobe superimposed on this there is more confluent increased opacity in the area which could be increased regional atelectasis or small pneumonia. Diffuse central bronchial thickening is seen as well as increased amount of scattered small posterior basal bronchiolar impactions. There are  increased posterior basal left lower lobe tree-in-bud interstitial changes consistent with small airways disease/bronchiolitis. Findings could be due to interval chronic aspiration. Remaining lungs are generally clear. Musculoskeletal: Osteopenia, levoscoliosis and degenerative changes of the thoracic spine are again noted. There is again noted mild chronic anterior wedging of the T3 and T11 vertebral bodies. Today there is new demonstration of a mild upper plate anterior wedge compression fracture of the T6 vertebral body with 15% anterior and 5% posterior height loss without retropulsion, and a severe compression fracture of T7 body with posterosuperior cortical retropulsion up to 4 mm mildly encroaching on the ventral cord surface and narrowing the AP diameter of the thecal sac to 8.5 mm, with moderate to severe bilateral T7-8 foraminal stenosis secondarily. There is no displaced fracture in the visualized shoulder girdles, spinal posterior elements of the sternum. There are mildly displaced fractures of the right posterolateral ninth and tenth ribs. No other displaced rib fracture is seen. CT ABDOMEN PELVIS FINDINGS Hepatobiliary: No hepatic injury or perihepatic hematoma. Gallbladder is unremarkable Pancreas: Atrophic and otherwise unremarkable. Spleen: No splenic injury or perisplenic hematoma. Adrenals/Urinary Tract: No adrenal or renal mass enhancement. There is a small cyst in the posteroinferior left kidney. There is no urinary stone or obstruction. No thickening of the bladder wall. Stomach/Bowel: No dilatation or wall thickening. An appendix is not seen. Vascular/Lymphatic: Extensive aortoiliac calcific plaque  noted, fusiform dilatation in the infrarenal aorta up to 4.5 cm which was seen previously, unchanged. No adjacent fat stranding is evident or other evidence of impending rupture. The portal vein is patent. No adenopathy is seen. Reproductive: Massive fibroid uterus again noted: Today's exam  measuring 15.4 x 14.3 by 13.5 cm. Other: There are mild mesenteric and body wall congestive features which were noted previously. There is trace ascites in the posterior pelvis which was also seen previously. There is no free air, free hemorrhage or abscess. Musculoskeletal: Osteopenia, dextroscoliosis and degenerative change lumbar spine. No visible regional skeletal fracture or compression injury. IMPRESSION: 1. No acute intracranial CT findings or depressed skull fractures. 2. Chronic atrophy and small-vessel changes with stable bifrontal subdural hygromas. 3. Interval worsening right mastoid effusion with extensive diffuse opacification of left mastoid air cells and left middle ear cavity. Correlate clinically for otomastoiditis. 4. Worsening sinus disease in the ethmoids with continued fluid in the sphenoid sinus. 5. Osteopenia and degenerative change without appreciable cervical fracture. 6. Increased opacity in the right lower lobe which could be due to interval increased atelectasis, pulmonary contusions or infiltrates. 7. Increased scattered small posterior lower lobe bronchiolar impactions and increasing left lower lobe tree-in-bud interstitial change consistent with small airways disease. Consider aspiration precautions. 8. Mildly displaced fractures of the posterolateral right ninth ribs with acute appearance. 9. Mild upper plate anterior wedge compression fracture of T6 vertebral body new from 12/06/2021, possibly acute. 10. Severe anterior wedge compression fracture of the T7 vertebral body with 4 mm retropulsion encroaching somewhat on the ventral cord surface with mild spinal stenosis and with biforaminal stenosis at T7-8. This could be acute or subacute but it was not seen on 12/06/2021. There is only slight thickening of the paraspinal soft tissues at this level compared to the previous exam. 11. Right axillary adenopathy again noted with interval development of mildly enlarged left axillary lymph  nodes. Clinical correlation for lymphoproliferative disease or recurrent right breast cancer versus infectious process recommended. 12. Cardiomegaly with aortic and coronary artery atherosclerosis, 4.2 cm stable dilatation of the ascending aorta, and up to 4.5 cm diffuse aneurysmal dilatation in the abdominal aorta. Recommend imaging follow-up at six-month intervals and vascular consultation if not already done. 13. Again noted atheromatous ulcerative plaque anteriorly in the proximal ascending aorta. No dissection. 14. Massive fibroid uterus. Electronically Signed   By: Telford Nab M.D.   On: 02/19/2022 04:18   CT CHEST ABDOMEN PELVIS W CONTRAST  Result Date: 02/19/2022 CLINICAL DATA:  Unwitnessed fall, possible head trauma, complains of pain in the right ribcage. History of right breast cancer. EXAM: CT HEAD WITHOUT CONTRAST CT CERVICAL SPINE WITHOUT CONTRAST CT CHEST, ABDOMEN AND PELVIS WITH CONTRAST TECHNIQUE: Contiguous axial images were obtained from the base of the skull through the vertex without intravenous contrast. Multidetector CT imaging of the cervical spine was performed without intravenous contrast. Multiplanar CT image reconstructions were also generated. Multidetector CT imaging of the chest, abdomen and pelvis was performed following the standard protocol during bolus administration of intravenous contrast. RADIATION DOSE REDUCTION: This exam was performed according to the departmental dose-optimization program which includes automated exposure control, adjustment of the mA and/or kV according to patient size and/or use of iterative reconstruction technique. CONTRAST:  155m OMNIPAQUE IOHEXOL 300 MG/ML  SOLN COMPARISON:  PA and lateral chest and CTA chest, abdomen and pelvis 12/06/2021, chest CT with contrast 09/14/2019, head CT 12/06/2021, and cervical spine CT 12/06/2021. FINDINGS: CT HEAD FINDINGS Brain: There is moderately  advanced global atrophy with atrophic ventriculomegaly, moderate  to severe small vessel disease of the cerebral white matter, and stable appearance of bifrontal subdural hygromas. There are small chronic lacunar infarctions in both gangliocapsular areas. No asymmetry is seen concerning for an acute infarct, hemorrhage or mass. Chronic lacunar infarct in the superior left cerebellar hemisphere is also redemonstrated. Vascular: There is carotid atherosclerosis without hyperdense central vessels. Skull: Osteopenia. Negative for fracture or focal lesion. No visible scalp hematoma. Sinuses/Orbits: Fluid is again noted posteriorly in the left sphenoid air cell. There is increased patchy opacification of the ethmoid air cells. The frontal and visualized maxillary sinuses are clear. There is moderate patchy fluid increased throughout the right mastoid air cells with no right middle ear opacity. There has developed extensive fluid throughout the left mastoid air cells and middle ear cavity. No bone destruction is seen. Old lens replacements. Other: None. CT CERVICAL FINDINGS Alignment: Slight dextroscoliosis.  Otherwise normal. Skull base and vertebrae: Osteopenia. No fracture or primary bone lesion is seen. Soft tissues and spinal canal: No prevertebral fluid or swelling. No visible canal hematoma. There is bilateral carotid atherosclerosis. Disc levels: Moderate disc space loss again at C4-5 and C5-6. Trace marginal osteophytes. No significant soft tissue or bony encroachment on the thecal sac is seen. There is a mild nonstenosing posterior disc bulge again noted at C2-3. Facet joint and uncinate hypertrophy left-greater-than-right is seen at most levels, with multilevel left-sided foraminal stenosis greatest at C4-5 and C5-6. There is mild narrowing and spurring at the anterior atlantodental joint. Other:  None. CT CHEST FINDINGS Cardiovascular: Slightly dilated ascending aorta again measures 4.2 cm. There is diffuse aortic atherosclerosis with descending segment tortuosity, stable  arch segment measurement 3.8 cm and ulcerative atheromatous plaque in the anterior aspect of the proximal arch, as before. The great vessels show scattered calcification without stenosis or aneurysm. There is no aortic or great vessel dissection. There is mild cardiomegaly with three-vessel calcific CAD. Pulmonary arteries are normal caliber and centrally clear. Pulmonary veins are decompressed. Mediastinum/Nodes: There is no intrathoracic adenopathy or focal tracheal disease. There is no esophageal thickening. Thyroid gland is small and atrophic. There is right axillary adenopathy with largest lymph node measuring 2.3 x 1.4 cm on 1:14. There are 2 adjacent mildly prominent left axillary lymph nodes not seen previously. Largest of these is 1.6 x 1.1 cm on 1:15. Clinical correlation advised. These lymph nodes were not enlarged on 09/14/2019 and left axillary adenopathy is new since. Lungs/Pleura: Emphysematous and scarring changes are again shown. There is chronic atelectasis or scarring in the posterolateral right lower lobe superimposed on this there is more confluent increased opacity in the area which could be increased regional atelectasis or small pneumonia. Diffuse central bronchial thickening is seen as well as increased amount of scattered small posterior basal bronchiolar impactions. There are increased posterior basal left lower lobe tree-in-bud interstitial changes consistent with small airways disease/bronchiolitis. Findings could be due to interval chronic aspiration. Remaining lungs are generally clear. Musculoskeletal: Osteopenia, levoscoliosis and degenerative changes of the thoracic spine are again noted. There is again noted mild chronic anterior wedging of the T3 and T11 vertebral bodies. Today there is new demonstration of a mild upper plate anterior wedge compression fracture of the T6 vertebral body with 15% anterior and 5% posterior height loss without retropulsion, and a severe compression  fracture of T7 body with posterosuperior cortical retropulsion up to 4 mm mildly encroaching on the ventral cord surface and narrowing the AP diameter  of the thecal sac to 8.5 mm, with moderate to severe bilateral T7-8 foraminal stenosis secondarily. There is no displaced fracture in the visualized shoulder girdles, spinal posterior elements of the sternum. There are mildly displaced fractures of the right posterolateral ninth and tenth ribs. No other displaced rib fracture is seen. CT ABDOMEN PELVIS FINDINGS Hepatobiliary: No hepatic injury or perihepatic hematoma. Gallbladder is unremarkable Pancreas: Atrophic and otherwise unremarkable. Spleen: No splenic injury or perisplenic hematoma. Adrenals/Urinary Tract: No adrenal or renal mass enhancement. There is a small cyst in the posteroinferior left kidney. There is no urinary stone or obstruction. No thickening of the bladder wall. Stomach/Bowel: No dilatation or wall thickening. An appendix is not seen. Vascular/Lymphatic: Extensive aortoiliac calcific plaque noted, fusiform dilatation in the infrarenal aorta up to 4.5 cm which was seen previously, unchanged. No adjacent fat stranding is evident or other evidence of impending rupture. The portal vein is patent. No adenopathy is seen. Reproductive: Massive fibroid uterus again noted: Today's exam measuring 15.4 x 14.3 by 13.5 cm. Other: There are mild mesenteric and body wall congestive features which were noted previously. There is trace ascites in the posterior pelvis which was also seen previously. There is no free air, free hemorrhage or abscess. Musculoskeletal: Osteopenia, dextroscoliosis and degenerative change lumbar spine. No visible regional skeletal fracture or compression injury. IMPRESSION: 1. No acute intracranial CT findings or depressed skull fractures. 2. Chronic atrophy and small-vessel changes with stable bifrontal subdural hygromas. 3. Interval worsening right mastoid effusion with extensive  diffuse opacification of left mastoid air cells and left middle ear cavity. Correlate clinically for otomastoiditis. 4. Worsening sinus disease in the ethmoids with continued fluid in the sphenoid sinus. 5. Osteopenia and degenerative change without appreciable cervical fracture. 6. Increased opacity in the right lower lobe which could be due to interval increased atelectasis, pulmonary contusions or infiltrates. 7. Increased scattered small posterior lower lobe bronchiolar impactions and increasing left lower lobe tree-in-bud interstitial change consistent with small airways disease. Consider aspiration precautions. 8. Mildly displaced fractures of the posterolateral right ninth ribs with acute appearance. 9. Mild upper plate anterior wedge compression fracture of T6 vertebral body new from 12/06/2021, possibly acute. 10. Severe anterior wedge compression fracture of the T7 vertebral body with 4 mm retropulsion encroaching somewhat on the ventral cord surface with mild spinal stenosis and with biforaminal stenosis at T7-8. This could be acute or subacute but it was not seen on 12/06/2021. There is only slight thickening of the paraspinal soft tissues at this level compared to the previous exam. 11. Right axillary adenopathy again noted with interval development of mildly enlarged left axillary lymph nodes. Clinical correlation for lymphoproliferative disease or recurrent right breast cancer versus infectious process recommended. 12. Cardiomegaly with aortic and coronary artery atherosclerosis, 4.2 cm stable dilatation of the ascending aorta, and up to 4.5 cm diffuse aneurysmal dilatation in the abdominal aorta. Recommend imaging follow-up at six-month intervals and vascular consultation if not already done. 13. Again noted atheromatous ulcerative plaque anteriorly in the proximal ascending aorta. No dissection. 14. Massive fibroid uterus. Electronically Signed   By: Telford Nab M.D.   On: 02/19/2022 04:18   CT  L-SPINE NO CHARGE  Result Date: 02/19/2022 CLINICAL DATA:  Initial evaluation for acute trauma, fall. EXAM: CT LUMBAR SPINE WITHOUT CONTRAST TECHNIQUE: Multidetector CT imaging of the lumbar spine was performed without intravenous contrast administration. Multiplanar CT image reconstructions were also generated. RADIATION DOSE REDUCTION: This exam was performed according to the departmental dose-optimization program  which includes automated exposure control, adjustment of the mA and/or kV according to patient size and/or use of iterative reconstruction technique. COMPARISON:  Prior radiograph from 12/21/2021. FINDINGS: Segmentation: Bones are markedly osteopenic, somewhat limiting assessment for possible subtle nondisplaced fractures. Transitional features seen about the lumbosacral junction. For the purposes of this report, lowest full rib-bearing vertebral body is labeled T12. There is partial sacralization of the L5 vertebral body. Alignment: Sigmoid scoliosis.  No significant listhesis. Vertebrae: Chronic compression deformity of T11 noted. Vertebral body height otherwise maintained without acute fracture. Visualized sacrum and pelvis grossly intact. SI joints symmetric and within normal limits. Partially visualized lower ribs grossly intact. No discrete or worrisome osseous lesions. Paraspinal and other soft tissues: Paraspinous soft tissues demonstrate no acute finding. Markedly enlarged fibroid uterus noted. Intra-abdominal aneurysm measuring up to 4.6 cm in diameter noted, better evaluated on concomitant CT of the abdomen and pelvis. Disc levels: Underlying mild-to-moderate degenerative spondylosis, most pronounced at T12-L1 and L3-4. Scattered multilevel facet hypertrophy. No significant spinal stenosis by CT. IMPRESSION: 1. No acute traumatic injury within the lumbar spine. 2. Chronic compression deformity of T11. 3. Intra-abdominal aneurysm measuring up to 4.6 cm in diameter, better evaluated on  concomitant CT of the abdomen and pelvis. Recommend follow-up every 6 months and vascular consultation. This recommendation follows ACR consensus guidelines: White Paper of the ACR Incidental Findings Committee II on Vascular Findings. J Am Coll Radiol 2013; 10:789-794. 4. Markedly enlarged fibroid uterus. Aortic Atherosclerosis (ICD10-I70.0). Electronically Signed   By: Jeannine Boga M.D.   On: 02/19/2022 03:33   DG Femur Min 2 Views Right  Result Date: 02/19/2022 CLINICAL DATA:  Status post fall. EXAM: RIGHT FEMUR 2 VIEWS COMPARISON:  Dec 06, 2021 FINDINGS: There is no evidence of an acute fracture or dislocation. A right knee replacement is seen without evidence of surrounding lucency to suggest the presence of hardware loosening or infection. Soft tissues are unremarkable. IMPRESSION: 1. Intact right knee replacement. 2. No acute osseous abnormality. Electronically Signed   By: Virgina Norfolk M.D.   On: 02/19/2022 01:03   DG Ribs Unilateral W/Chest Right  Result Date: 02/19/2022 CLINICAL DATA:  Right rib pain after fall EXAM: RIGHT RIBS AND CHEST - 3+ VIEW COMPARISON:  Radiographs 02/13/2022 FINDINGS: Demineralization limits sensitivity for subtle fractures. No displaced right rib fractures. Right TSA. Calcified tortuous aorta. Coronary stenting. Unchanged marked compression of a midthoracic vertebral body. IMPRESSION: Marked demineralization.  No definite displaced rib fractures. Electronically Signed   By: Placido Sou M.D.   On: 02/19/2022 00:56   DG Pelvis 1-2 Views  Result Date: 02/19/2022 CLINICAL DATA:  Status post fall. EXAM: PELVIS - 1-2 VIEW COMPARISON:  February 13, 2022 FINDINGS: There is no evidence of pelvic fracture or diastasis. No pelvic bone lesions are seen. IMPRESSION: Negative. Electronically Signed   By: Virgina Norfolk M.D.   On: 02/19/2022 00:55    Procedures Procedures    Medications Ordered in ED Medications  lidocaine (LIDODERM) 5 % 1 patch (1 patch  Transdermal Patch Applied 02/19/22 0121)  acetaminophen (TYLENOL) tablet 650 mg (has no administration in time range)  ibuprofen (ADVIL) tablet 600 mg (has no administration in time range)  fentaNYL (SUBLIMAZE) injection 12.5 mcg (12.5 mcg Intravenous Given 02/19/22 0050)  sodium chloride 0.9 % bolus 1,000 mL (0 mLs Intravenous Stopped 02/19/22 0416)  ondansetron (ZOFRAN) injection 4 mg (4 mg Intravenous Given 02/19/22 0049)  iohexol (OMNIPAQUE) 300 MG/ML solution 100 mL (100 mLs Intravenous Contrast Given 02/19/22 0212)  fentaNYL (SUBLIMAZE) injection 12.5 mcg (12.5 mcg Intravenous Given 02/19/22 0415)    ED Course/ Medical Decision Making/ A&P                           Medical Decision Making Amount and/or Complexity of Data Reviewed Labs: ordered. Radiology: ordered.  Risk OTC drugs. Prescription drug management.   This patient presents to the ED with chief complaint(s) of fall with pertinent past medical history of osteoporosis, COPD, chronic O2 use,. AAA, cancer which further complicates the presenting complaint. The complaint involves an extensive differential diagnosis and also carries with it a high risk of complications and morbidity.    Differential diagnoses for head trauma includes but not limited to subdural hematoma, epidural hematoma, acute concussion, traumatic subarachnoid hemorrhage, cerebral contusions, etc. also concern for strain, sprain, MSK injury, fracture to ribs or hip, pelvis.  Intra-abdominal injury.  Other acute etiologies were considered.  . Serious etiologies were considered.   The initial plan is to XR, analgesics, labs   Additional history obtained: Additional history obtained from  n/a Records reviewed [facility] and prior ED visits, prior labs and imaging, prior urgent care notes, prior medications  Independent labs interpretation:  The following labs were independently interpreted: Labs reviewed and appear similar to her prior  values  Independent visualization of imaging: - I independently visualized the following imaging with scope of interpretation limited to determining acute life threatening conditions related to emergency care: X-ray femur, ribs, pelvis-these are unremarkable so I did order CT imaging of head, C-spine, chest abdomen pelvis.  L spine., which revealed she is 2 rib fractures on the right, 9 and 10.  No signs of pneumothorax.  She has multiple compression fractures of her spine.  She has known history of this.  There is no significant TTP with palpation to her entire spinal column.  I have low suspicion for any acute compression fracture.  Also has known ascending aortic aneurysm appears to be stable.  There is some possible concern for pulmonary abnormalities per radiologist, patient has no respiratory complaints.  She is breathing comfortably on her typical nasal cannula.  No dyspnea.  Patient also has no mastoid tenderness, no pain with manipulation of the auricle.  TMs clear bilateral.  Hearing loss to left side is chronic per the patient.  Cardiac monitoring was reviewed and interpreted by myself which shows NSR  Treatment and Reassessment: IVF, fentanyl, zofran, tylenol/motrin >> improved  Consultation: - Consulted or discussed management/test interpretation w/ external professional: n/a  Consideration for admission or further workup: Admission was considered, however patient's pain is well controlled, she feels essentially at her baseline.  She lives at assisted care facility.  Plan discharge back home with close outpatient follow-up. F/u with spine specialist. She again has no spinous process ttp with palp/percussion today.   The patient improved significantly and was discharged in stable condition. Detailed discussions were had with the patient regarding current findings, and need for close f/u with PCP or on call doctor. The patient has been instructed to return immediately if the symptoms  worsen in any way for re-evaluation. Patient verbalized understanding and is in agreement with current care plan. All questions answered prior to discharge.    Social Determinants of health: Social History   Tobacco Use   Smoking status: Former    Packs/day: 1.50    Years: 10.00    Total pack years: 15.00    Types: Cigarettes  Quit date: 07/09/1985    Years since quitting: 36.6   Smokeless tobacco: Never   Tobacco comments:    1 1/2 ppd x 20 years  Vaping Use   Vaping Use: Never used  Substance Use Topics   Alcohol use: No    Alcohol/week: 0.0 standard drinks of alcohol   Drug use: No            Final Clinical Impression(s) / ED Diagnoses Final diagnoses:  Fall, initial encounter  Closed fracture of multiple ribs of right side, initial encounter  Compression fracture of thoracic vertebra, unspecified thoracic vertebral level, initial encounter ([place])  Axillary lymphadenopathy    Rx / DC Orders ED Discharge Orders          Ordered    methocarbamol (ROBAXIN) 500 MG tablet  2 times daily        02/19/22 0647    lidocaine (LIDODERM) 5 %  Daily PRN        02/19/22 0647    ibuprofen (ADVIL) 600 MG tablet  Every 6 hours PRN        02/19/22 [place], Kings A, DO 02/19/22 [phone]

## 2022-02-19 NOTE — ED Notes (Signed)
PT daughter request call for update [phone]

## 2022-02-19 NOTE — ED Notes (Signed)
Pt's daughter states understanding of dc instructions, importance of follow up, and prescriptions. Pt's daughter denies questions or concerns upon dc. Pt's daughter instructed on how to use IS.  Daughter states understanding. Pt assisted into wheelchair and wheeled out to daughters vehicle. No belongings left in room upon dc.

## 2022-02-20 ENCOUNTER — Other Ambulatory Visit: Payer: Self-pay

## 2022-02-20 ENCOUNTER — Encounter: Payer: Self-pay | Admitting: Family Medicine

## 2022-02-20 ENCOUNTER — Encounter (HOSPITAL_COMMUNITY): Payer: Self-pay

## 2022-02-20 ENCOUNTER — Inpatient Hospital Stay (HOSPITAL_COMMUNITY)
Admission: EM | Admit: 2022-02-20 | Discharge: 2022-02-26 | DRG: 177 | Disposition: A | Discharge status: Skilled Nursing Facility | Payer: Medicare PPO | Attending: Family Medicine | Admitting: Family Medicine

## 2022-02-20 ENCOUNTER — Telehealth: Payer: Self-pay | Admitting: *Deleted

## 2022-02-20 ENCOUNTER — Emergency Department (HOSPITAL_COMMUNITY): Payer: Medicare PPO

## 2022-02-20 ENCOUNTER — Telehealth: Payer: Medicare PPO

## 2022-02-20 ENCOUNTER — Telehealth (INDEPENDENT_AMBULATORY_CARE_PROVIDER_SITE_OTHER): Payer: Medicare PPO | Admitting: Family Medicine

## 2022-02-20 DIAGNOSIS — J9621 Acute and chronic respiratory failure with hypoxia: Secondary | ICD-10-CM | POA: Diagnosis present

## 2022-02-20 DIAGNOSIS — Z66 Do not resuscitate: Secondary | ICD-10-CM | POA: Diagnosis present

## 2022-02-20 DIAGNOSIS — E559 Vitamin D deficiency, unspecified: Secondary | ICD-10-CM | POA: Diagnosis present

## 2022-02-20 DIAGNOSIS — F03A18 Unspecified dementia, mild, with other behavioral disturbance: Secondary | ICD-10-CM | POA: Diagnosis present

## 2022-02-20 DIAGNOSIS — Z801 Family history of malignant neoplasm of trachea, bronchus and lung: Secondary | ICD-10-CM | POA: Diagnosis not present

## 2022-02-20 DIAGNOSIS — Z955 Presence of coronary angioplasty implant and graft: Secondary | ICD-10-CM

## 2022-02-20 DIAGNOSIS — I5022 Chronic systolic (congestive) heart failure: Secondary | ICD-10-CM | POA: Diagnosis present

## 2022-02-20 DIAGNOSIS — Z9981 Dependence on supplemental oxygen: Secondary | ICD-10-CM

## 2022-02-20 DIAGNOSIS — Z7189 Other specified counseling: Secondary | ICD-10-CM | POA: Diagnosis not present

## 2022-02-20 DIAGNOSIS — R410 Disorientation, unspecified: Secondary | ICD-10-CM | POA: Diagnosis not present

## 2022-02-20 DIAGNOSIS — K219 Gastro-esophageal reflux disease without esophagitis: Secondary | ICD-10-CM | POA: Diagnosis present

## 2022-02-20 DIAGNOSIS — E039 Hypothyroidism, unspecified: Secondary | ICD-10-CM | POA: Diagnosis present

## 2022-02-20 DIAGNOSIS — W06XXXD Fall from bed, subsequent encounter: Secondary | ICD-10-CM | POA: Diagnosis present

## 2022-02-20 DIAGNOSIS — Z79890 Hormone replacement therapy (postmenopausal): Secondary | ICD-10-CM

## 2022-02-20 DIAGNOSIS — M810 Age-related osteoporosis without current pathological fracture: Secondary | ICD-10-CM | POA: Diagnosis present

## 2022-02-20 DIAGNOSIS — Z8673 Personal history of transient ischemic attack (TIA), and cerebral infarction without residual deficits: Secondary | ICD-10-CM | POA: Diagnosis not present

## 2022-02-20 DIAGNOSIS — F03918 Unspecified dementia, unspecified severity, with other behavioral disturbance: Secondary | ICD-10-CM | POA: Diagnosis present

## 2022-02-20 DIAGNOSIS — Z79811 Long term (current) use of aromatase inhibitors: Secondary | ICD-10-CM

## 2022-02-20 DIAGNOSIS — G9341 Metabolic encephalopathy: Secondary | ICD-10-CM | POA: Diagnosis present

## 2022-02-20 DIAGNOSIS — J9622 Acute and chronic respiratory failure with hypercapnia: Secondary | ICD-10-CM | POA: Diagnosis present

## 2022-02-20 DIAGNOSIS — R778 Other specified abnormalities of plasma proteins: Secondary | ICD-10-CM | POA: Diagnosis present

## 2022-02-20 DIAGNOSIS — I252 Old myocardial infarction: Secondary | ICD-10-CM

## 2022-02-20 DIAGNOSIS — J690 Pneumonitis due to inhalation of food and vomit: Principal | ICD-10-CM | POA: Diagnosis present

## 2022-02-20 DIAGNOSIS — D638 Anemia in other chronic diseases classified elsewhere: Secondary | ICD-10-CM | POA: Diagnosis present

## 2022-02-20 DIAGNOSIS — Z7983 Long term (current) use of bisphosphonates: Secondary | ICD-10-CM

## 2022-02-20 DIAGNOSIS — Z853 Personal history of malignant neoplasm of breast: Secondary | ICD-10-CM | POA: Diagnosis not present

## 2022-02-20 DIAGNOSIS — E8729 Other acidosis: Secondary | ICD-10-CM | POA: Diagnosis present

## 2022-02-20 DIAGNOSIS — Z87891 Personal history of nicotine dependence: Secondary | ICD-10-CM | POA: Diagnosis not present

## 2022-02-20 DIAGNOSIS — S2241XD Multiple fractures of ribs, right side, subsequent encounter for fracture with routine healing: Secondary | ICD-10-CM

## 2022-02-20 DIAGNOSIS — I110 Hypertensive heart disease with heart failure: Secondary | ICD-10-CM | POA: Diagnosis present

## 2022-02-20 DIAGNOSIS — Z170 Estrogen receptor positive status [ER+]: Secondary | ICD-10-CM

## 2022-02-20 DIAGNOSIS — E876 Hypokalemia: Secondary | ICD-10-CM | POA: Diagnosis present

## 2022-02-20 DIAGNOSIS — Z79899 Other long term (current) drug therapy: Secondary | ICD-10-CM

## 2022-02-20 DIAGNOSIS — I2510 Atherosclerotic heart disease of native coronary artery without angina pectoris: Secondary | ICD-10-CM | POA: Diagnosis present

## 2022-02-20 DIAGNOSIS — E782 Mixed hyperlipidemia: Secondary | ICD-10-CM | POA: Diagnosis present

## 2022-02-20 DIAGNOSIS — J441 Chronic obstructive pulmonary disease with (acute) exacerbation: Secondary | ICD-10-CM | POA: Diagnosis present

## 2022-02-20 DIAGNOSIS — Z825 Family history of asthma and other chronic lower respiratory diseases: Secondary | ICD-10-CM

## 2022-02-20 DIAGNOSIS — R7989 Other specified abnormal findings of blood chemistry: Secondary | ICD-10-CM | POA: Diagnosis present

## 2022-02-20 DIAGNOSIS — Z8249 Family history of ischemic heart disease and other diseases of the circulatory system: Secondary | ICD-10-CM

## 2022-02-20 DIAGNOSIS — M797 Fibromyalgia: Secondary | ICD-10-CM | POA: Diagnosis present

## 2022-02-20 LAB — CBC WITH DIFFERENTIAL/PLATELET
Abs Immature Granulocytes: 0.02 10*3/uL (ref 0.00–0.07)
Basophils Absolute: 0 10*3/uL (ref 0.0–0.1)
Basophils Relative: 1 %
Eosinophils Absolute: 0.1 10*3/uL (ref 0.0–0.5)
Eosinophils Relative: 1 %
HCT: 36.9 % (ref 36.0–46.0)
Hemoglobin: 10.9 g/dL — ABNORMAL LOW (ref 12.0–15.0)
Immature Granulocytes: 0 %
Lymphocytes Relative: 17 %
Lymphs Abs: 0.9 10*3/uL (ref 0.7–4.0)
MCH: 32.2 pg (ref 26.0–34.0)
MCHC: 29.5 g/dL — ABNORMAL LOW (ref 30.0–36.0)
MCV: 109.2 fL — ABNORMAL HIGH (ref 80.0–100.0)
Monocytes Absolute: 0.6 10*3/uL (ref 0.1–1.0)
Monocytes Relative: 10 %
Neutro Abs: 4 10*3/uL (ref 1.7–7.7)
Neutrophils Relative %: 71 %
Platelets: 143 10*3/uL — ABNORMAL LOW (ref 150–400)
RBC: 3.38 MIL/uL — ABNORMAL LOW (ref 3.87–5.11)
RDW: 14.6 % (ref 11.5–15.5)
WBC: 5.6 10*3/uL (ref 4.0–10.5)
nRBC: 0 % (ref 0.0–0.2)

## 2022-02-20 LAB — BLOOD GAS, VENOUS
Acid-Base Excess: 26.2 mmol/L — ABNORMAL HIGH (ref 0.0–2.0)
Bicarbonate: 59.3 mmol/L — ABNORMAL HIGH (ref 20.0–28.0)
O2 Saturation: 37.3 %
Patient temperature: 37
pCO2, Ven: 115 mmHg (ref 44–60)
pH, Ven: 7.32 (ref 7.25–7.43)
pO2, Ven: <31 mmHg — CL (ref 32–45)

## 2022-02-20 LAB — URINALYSIS, ROUTINE W REFLEX MICROSCOPIC
Bacteria, UA: NONE SEEN
Bilirubin Urine: NEGATIVE
Glucose, UA: NEGATIVE mg/dL
Hgb urine dipstick: NEGATIVE
Ketones, ur: NEGATIVE mg/dL
Nitrite: NEGATIVE
Protein, ur: 30 mg/dL — AB
Specific Gravity, Urine: 1.039 — ABNORMAL HIGH (ref 1.005–1.030)
pH: 5 (ref 5.0–8.0)

## 2022-02-20 LAB — COMPREHENSIVE METABOLIC PANEL
ALT: 12 U/L (ref 0–44)
AST: 27 U/L (ref 15–41)
Albumin: 3.6 g/dL (ref 3.5–5.0)
Alkaline Phosphatase: 45 U/L (ref 38–126)
Anion gap: 7 (ref 5–15)
BUN: 16 mg/dL (ref 8–23)
CO2: 42 mmol/L — ABNORMAL HIGH (ref 22–32)
Calcium: 9.2 mg/dL (ref 8.9–10.3)
Chloride: 95 mmol/L — ABNORMAL LOW (ref 98–111)
Creatinine, Ser: 0.7 mg/dL (ref 0.44–1.00)
GFR, Estimated: >60 mL/min (ref >60)
Glucose, Bld: 139 mg/dL — ABNORMAL HIGH (ref 70–99)
Potassium: 3.5 mmol/L (ref 3.5–5.1)
Sodium: 144 mmol/L (ref 135–145)
Total Bilirubin: 0.7 mg/dL (ref 0.3–1.2)
Total Protein: 7 g/dL (ref 6.5–8.1)

## 2022-02-20 LAB — MRSA NEXT GEN BY PCR, NASAL: MRSA by PCR Next Gen: NOT DETECTED

## 2022-02-20 LAB — TROPONIN I (HIGH SENSITIVITY)
Troponin I (High Sensitivity): 22 ng/L — ABNORMAL HIGH (ref <18)
Troponin I (High Sensitivity): 21 ng/L — ABNORMAL HIGH (ref <18)

## 2022-02-20 LAB — CBG MONITORING, ED: Glucose-Capillary: 118 mg/dL — ABNORMAL HIGH (ref 70–99)

## 2022-02-20 MED ORDER — IPRATROPIUM-ALBUTEROL 0.5-2.5 (3) MG/3ML IN SOLN
3.0000 mL | Freq: Three times a day (TID) | RESPIRATORY_TRACT | Status: DC
  Administered 2022-02-21 (×3): 3 mL via RESPIRATORY_TRACT
  Filled 2022-02-20 (×4): qty 3

## 2022-02-20 MED ORDER — ENOXAPARIN SODIUM 40 MG/0.4ML IJ SOSY
40.0000 mg | PREFILLED_SYRINGE | INTRAMUSCULAR | Status: DC
  Administered 2022-02-20 – 2022-02-25 (×6): 40 mg via SUBCUTANEOUS
  Filled 2022-02-20 (×6): qty 0.4

## 2022-02-20 MED ORDER — ACETAMINOPHEN 325 MG PO TABS
650.0000 mg | ORAL_TABLET | Freq: Four times a day (QID) | ORAL | Status: DC | PRN
  Administered 2022-02-21 – 2022-02-26 (×2): 650 mg via ORAL
  Filled 2022-02-20: qty 2

## 2022-02-20 MED ORDER — LEVOTHYROXINE SODIUM 112 MCG PO TABS
112.0000 ug | ORAL_TABLET | Freq: Every day | ORAL | Status: DC
  Administered 2022-02-21 – 2022-02-26 (×6): 112 ug via ORAL
  Filled 2022-02-20 (×6): qty 1

## 2022-02-20 MED ORDER — FLUTICASONE PROPIONATE 50 MCG/ACT NA SUSP: 2.0000 | Freq: Every day | NASAL | Status: DC | PRN

## 2022-02-20 MED ORDER — ORAL CARE MOUTH RINSE: 15.0000 mL | OROMUCOSAL | Status: DC | PRN

## 2022-02-20 MED ORDER — PANTOPRAZOLE SODIUM 40 MG IV SOLR
40.0000 mg | INTRAVENOUS | Status: DC
  Administered 2022-02-20 – 2022-02-23 (×4): 40 mg via INTRAVENOUS
  Filled 2022-02-20 (×4): qty 10

## 2022-02-20 MED ORDER — ACETAMINOPHEN 325 MG PO TABS
650.0000 mg | ORAL_TABLET | Freq: Two times a day (BID) | ORAL | Status: DC
  Administered 2022-02-21 – 2022-02-26 (×10): 650 mg via ORAL
  Filled 2022-02-20 (×11): qty 2

## 2022-02-20 MED ORDER — CHLORHEXIDINE GLUCONATE CLOTH 2 % EX PADS
6.0000 | MEDICATED_PAD | Freq: Every day | CUTANEOUS | Status: DC
  Administered 2022-02-20 – 2022-02-25 (×5): 6 via TOPICAL

## 2022-02-20 MED ORDER — IPRATROPIUM-ALBUTEROL 0.5-2.5 (3) MG/3ML IN SOLN
3.0000 mL | Freq: Four times a day (QID) | RESPIRATORY_TRACT | Status: DC
  Administered 2022-02-20: 3 mL via RESPIRATORY_TRACT
  Filled 2022-02-20: qty 3

## 2022-02-20 MED ORDER — ROSUVASTATIN CALCIUM 10 MG PO TABS
10.0000 mg | ORAL_TABLET | Freq: Every day | ORAL | Status: DC
  Administered 2022-02-21 – 2022-02-26 (×6): 10 mg via ORAL
  Filled 2022-02-20 (×6): qty 1

## 2022-02-20 MED ORDER — DULOXETINE HCL 30 MG PO CPEP
60.0000 mg | ORAL_CAPSULE | Freq: Every day | ORAL | Status: DC
  Administered 2022-02-21 – 2022-02-26 (×6): 60 mg via ORAL
  Filled 2022-02-20 (×6): qty 2

## 2022-02-20 MED ORDER — LACTATED RINGERS IV BOLUS
250.0000 mL | Freq: Once | INTRAVENOUS | Status: AC
  Administered 2022-02-20: 250 mL via INTRAVENOUS

## 2022-02-20 MED ORDER — METHYLPREDNISOLONE SODIUM SUCC 125 MG IJ SOLR
60.0000 mg | Freq: Two times a day (BID) | INTRAMUSCULAR | Status: DC
  Administered 2022-02-20 – 2022-02-25 (×10): 60 mg via INTRAVENOUS
  Filled 2022-02-20 (×11): qty 2

## 2022-02-20 MED ORDER — ACETAMINOPHEN 500 MG PO TABS
1000.0000 mg | ORAL_TABLET | Freq: Once | ORAL | Status: DC
  Filled 2022-02-20: qty 2

## 2022-02-20 MED ORDER — LIDOCAINE 5 % EX PTCH
1.0000 | MEDICATED_PATCH | CUTANEOUS | Status: DC
  Administered 2022-02-23 – 2022-02-24 (×3): 1 via TRANSDERMAL
  Filled 2022-02-20 (×6): qty 1

## 2022-02-20 MED ORDER — FENTANYL CITRATE PF 50 MCG/ML IJ SOSY
50.0000 ug | PREFILLED_SYRINGE | INTRAMUSCULAR | Status: DC | PRN
  Administered 2022-02-20 – 2022-02-21 (×3): 50 ug via INTRAVENOUS
  Filled 2022-02-20 (×3): qty 1

## 2022-02-20 MED ORDER — ANASTROZOLE 1 MG PO TABS
1.0000 mg | ORAL_TABLET | Freq: Every day | ORAL | Status: DC
  Administered 2022-02-21 – 2022-02-26 (×6): 1 mg via ORAL
  Filled 2022-02-20 (×7): qty 1

## 2022-02-20 MED ORDER — NITROGLYCERIN 0.4 MG SL SUBL
0.4000 mg | SUBLINGUAL_TABLET | SUBLINGUAL | Status: DC | PRN
Start: 2022-02-20 — End: 2022-02-27

## 2022-02-20 MED ORDER — ASPIRIN 81 MG PO TBEC
81.0000 mg | DELAYED_RELEASE_TABLET | Freq: Every day | ORAL | Status: DC
  Administered 2022-02-21 – 2022-02-26 (×4): 81 mg via ORAL
  Filled 2022-02-20 (×5): qty 1

## 2022-02-20 MED ORDER — ONDANSETRON HCL 4 MG/2ML IJ SOLN: 4.0000 mg | Freq: Four times a day (QID) | INTRAMUSCULAR | Status: DC | PRN

## 2022-02-20 MED ORDER — ACETAMINOPHEN 650 MG RE SUPP: 650.0000 mg | Freq: Four times a day (QID) | RECTAL | Status: DC | PRN

## 2022-02-20 MED ORDER — IPRATROPIUM-ALBUTEROL 0.5-2.5 (3) MG/3ML IN SOLN
3.0000 mL | RESPIRATORY_TRACT | Status: DC | PRN
  Administered 2022-02-24: 3 mL via RESPIRATORY_TRACT
  Filled 2022-02-20: qty 3

## 2022-02-20 MED ORDER — SODIUM CHLORIDE 0.9 % IV SOLN
2.0000 g | INTRAVENOUS | Status: DC
  Administered 2022-02-20 – 2022-02-24 (×5): 2 g via INTRAVENOUS
  Filled 2022-02-20 (×5): qty 20

## 2022-02-20 MED ORDER — BUSPIRONE HCL 5 MG PO TABS
5.0000 mg | ORAL_TABLET | Freq: Two times a day (BID) | ORAL | Status: DC
  Administered 2022-02-20 – 2022-02-26 (×12): 5 mg via ORAL
  Filled 2022-02-20 (×12): qty 1

## 2022-02-20 MED ORDER — HALOPERIDOL LACTATE 5 MG/ML IJ SOLN
2.0000 mg | Freq: Once | INTRAMUSCULAR | Status: AC
  Administered 2022-02-20: 2 mg via INTRAVENOUS
  Filled 2022-02-20: qty 1

## 2022-02-20 MED ORDER — FENTANYL CITRATE PF 50 MCG/ML IJ SOSY
25.0000 ug | PREFILLED_SYRINGE | Freq: Once | INTRAMUSCULAR | Status: AC
  Administered 2022-02-20: 25 ug via INTRAVENOUS
  Filled 2022-02-20: qty 1

## 2022-02-20 MED ORDER — ONDANSETRON HCL 4 MG PO TABS: 4.0000 mg | ORAL_TABLET | Freq: Four times a day (QID) | ORAL | Status: DC | PRN

## 2022-02-20 MED ORDER — ACETAMINOPHEN 325 MG PO TABS: 650.0000 mg | ORAL_TABLET | Freq: Two times a day (BID) | ORAL | Status: DC

## 2022-02-20 MED ORDER — POLYETHYLENE GLYCOL 3350 17 G PO PACK: 17.0000 g | PACK | Freq: Every day | ORAL | Status: DC | PRN

## 2022-02-20 MED ORDER — AZITHROMYCIN 500 MG IV SOLR
500.0000 mg | INTRAVENOUS | Status: DC
  Administered 2022-02-20 – 2022-02-23 (×4): 500 mg via INTRAVENOUS
  Filled 2022-02-20 (×4): qty 5

## 2022-02-20 MED ORDER — HALOPERIDOL LACTATE 5 MG/ML IJ SOLN
2.5000 mg | Freq: Once | INTRAMUSCULAR | Status: AC
Start: 2022-02-20 — End: 2022-02-20
  Administered 2022-02-20: 2.5 mg via INTRAVENOUS
  Filled 2022-02-20: qty 1

## 2022-02-20 MED ORDER — DONEPEZIL HCL 5 MG PO TABS
5.0000 mg | ORAL_TABLET | Freq: Every day | ORAL | Status: DC
  Administered 2022-02-20 – 2022-02-25 (×6): 5 mg via ORAL
  Filled 2022-02-20 (×7): qty 1

## 2022-02-20 MED ORDER — KETOROLAC TROMETHAMINE 15 MG/ML IJ SOLN
15.0000 mg | Freq: Once | INTRAMUSCULAR | Status: AC
  Administered 2022-02-20: 15 mg via INTRAVENOUS
  Filled 2022-02-20: qty 1

## 2022-02-20 MED ORDER — ORAL CARE MOUTH RINSE
15.0000 mL | OROMUCOSAL | Status: DC
  Administered 2022-02-20 – 2022-02-26 (×20): 15 mL via OROMUCOSAL

## 2022-02-20 MED ORDER — TRAZODONE HCL 100 MG PO TABS
100.0000 mg | ORAL_TABLET | Freq: Every day | ORAL | Status: DC
  Administered 2022-02-20 – 2022-02-25 (×6): 100 mg via ORAL
  Filled 2022-02-20 (×2): qty 1
  Filled 2022-02-20: qty 2
  Filled 2022-02-20 (×2): qty 1
  Filled 2022-02-20: qty 2

## 2022-02-20 NOTE — Assessment & Plan Note (Signed)
   Underlying history of dementia complicates and exacerbates patient's presentation  Managing bouts of extreme agitation with as needed Haldol Encouraging family to remain at bedside is much as possible Frequent redirection by staff Fall precautions

## 2022-02-20 NOTE — Assessment & Plan Note (Signed)
   Identified on CT imaging of the chest during ED visit on 8/13  Rib fractures not redemonstrated on x-ray of the chest on presentation to the emergency department today  We will avoid opiates for now, treating patient with Lidoderm patch  Will place on Tylenol twice daily  We will pain vitamin D level  Supportive care otherwise, please see notations above concerning associated respiratory failure

## 2022-02-20 NOTE — Assessment & Plan Note (Signed)
.   Patient is complaining of chest pain although this is likely musculoskeletal due to rib fractures and is reproducible on palpation . Monitoring patient on telemetry . Continue home regimen of statin therapy . As needed nitroglycerin for ischemic chest pain . Daily antiplatelet therapy

## 2022-02-20 NOTE — ED Provider Notes (Signed)
[facility] DEPT Provider Note   CSN: [id number] Arrival date & time: 02/20/22  1534     History {Add pertinent medical, surgical, social history, OB history to HPI:1} Chief Complaint  Patient presents with   Altered Mental Status    [name] is a 88 y.o. female.   Altered Mental Status Patient presents for altered mental status.  Per triage note, family reports that she has dementia and confusion at baseline, however, this has increased over the past 2 days.  Her medical history includes HLD, HTN, hypothyroidism, anemia, anxiety, chronic pain, GERD, fibromyalgia, osteoporosis, COPD (on supplemental oxygen at baseline), breast cancer, CAD, CVA, CHF.  She was seen in the ED 2 nights ago.  This followed a fall out of her bed.  She underwent lab work, x-ray and CT imaging.  She was found to have 2 right-sided rib fractures, in addition to multiple age-indeterminate compression fractures of spine without associated tenderness.  She was prescribed Robaxin, lidocaine patches, and ibuprofen at time of recent discharge.  Currently, she describes "pain everywhere".  She does endorse back pain.  She also endorses a right-sided abdominal pain.     Home Medications Prior to Admission medications   Medication Sig Start Date End Date Taking? Authorizing Provider  acetaminophen (TYLENOL) 500 MG tablet Take 1,000 mg by mouth every 6 (six) hours as needed for mild pain or headache.    [provider]  albuterol (PROAIR HFA) 108 (90 Base) MCG/ACT inhaler Inhale 1-2 puffs into the lungs every 6 (six) hours as needed for wheezing or shortness of breath (Okay to fill with Ventolin or albuterol HFA). 10/30/19   Tonia Ghent, MD  alendronate (FOSAMAX) 70 MG tablet Take 1 tablet (70 mg total) by mouth every Monday. 10/02/21   Benay Pike, MD  ALPRAZolam Duanne Moron) 0.5 MG tablet Take 0.5 mg by mouth daily as needed for anxiety or sleep. 01/01/22   [provider]  anastrozole (ARIMIDEX) 1 MG tablet Take 1 tablet (1 mg total) by mouth daily. 10/05/20   Magrinat, Virgie Dad, MD  busPIRone (BUSPAR) 5 MG tablet TAKE ONE TABLET BY MOUTH IN THE MORNING AND IN THE EVENING Patient taking differently: Take 5 mg by mouth 2 (two) times daily. 01/18/22   Tonia Ghent, MD  Calcium Carbonate (CALTRATE 600 PO) Take 600 mg by mouth at bedtime.    [provider]  cholecalciferol (VITAMIN D3) 25 MCG (1000 UNIT) tablet Take 1,000 Units by mouth at bedtime.    [provider]  donepezil (ARICEPT) 5 MG tablet TAKE ONE TABLET BY MOUTH AT BEDTIME 02/16/22   Tonia Ghent, MD  DULoxetine (CYMBALTA) 60 MG capsule TAKE ONE CAPSULE BY MOUTH DAILY Patient taking differently: Take 60 mg by mouth daily. 12/22/21   Tonia Ghent, MD  Ensure (ENSURE) Take 237 mLs by mouth 2 (two) times daily between meals.    [provider]  ferrous sulfate (FERROUSUL) 325 (65 FE) MG tablet Take 1 tablet (325 mg total) by mouth daily with breakfast. 08/22/18   Tonia Ghent, MD  fluticasone (FLONASE) 50 MCG/ACT nasal spray PLACE 2 SPRAYS INTO BOTH NOSTRILS DAILY. Patient taking differently: Place 2 sprays into both nostrils daily as needed for allergies. 01/28/20   Tonia Ghent, MD  ibuprofen (ADVIL) 600 MG tablet Take 1 tablet (600 mg total) by mouth every 6 (six) hours as needed. 02/19/22   Wynona Dove A, DO  ipratropium-albuterol (DUONEB) 0.5-2.5 (3) MG/3ML  SOLN Take 3 mLs by nebulization 2 (two) times daily. 12/13/21   Florencia Reasons, MD  levothyroxine (SYNTHROID) 112 MCG tablet TAKE ONE TABLET BY MOUTH DAILY Patient taking differently: Take 112 mcg by mouth daily before breakfast. 11/08/21   Tonia Ghent, MD  lidocaine (LIDODERM) 5 % Place 1 patch onto the skin daily as needed. Remove & Discard patch within 12 hours or as directed by MD 02/19/22   Jeanell Sparrow, DO  methocarbamol (ROBAXIN) 500 MG tablet Take 1 tablet (500 mg total) by mouth 2 (two) times  daily for 5 days. 02/19/22 02/24/22  Jeanell Sparrow, DO  Multiple Vitamin (MULTIVITAMIN WITH MINERALS) TABS tablet Take 1 tablet by mouth daily.    [provider]  Multiple Vitamins-Minerals (OCUVITE PRESERVISION PO) Take 1 tablet by mouth at bedtime.    [provider]  nitroGLYCERIN (NITROSTAT) 0.4 MG SL tablet Place 1 tablet (0.4 mg total) under the tongue every 5 (five) minutes as needed for chest pain. 02/23/20   Belva Crome, MD  OXYGEN Inhale 3-3.5 L into the lungs continuous.    [provider]  pantoprazole (PROTONIX) 20 MG tablet TAKE ONE TABLET BY MOUTH TWICE DAILY Patient taking differently: Take 20 mg by mouth 2 (two) times daily. 10/23/21   Tonia Ghent, MD  polyethylene glycol (MIRALAX / GLYCOLAX) 17 g packet Take 17 g by mouth daily as needed for moderate constipation. 12/13/21   Florencia Reasons, MD  rosuvastatin (CRESTOR) 10 MG tablet Take 1 tablet (10 mg total) by mouth daily. 10/23/21   Tonia Ghent, MD  senna-docusate (SENOKOT-S) 8.6-50 MG tablet Take 1 tablet by mouth at bedtime. 12/13/21   Florencia Reasons, MD  SPIRIVA HANDIHALER 18 MCG inhalation capsule INHALE THE CONTENTS OF ONE CAPSULE VIA HANDIHALER DAILY Patient taking differently: Place 18 mcg into inhaler and inhale daily as needed (shortness of breath). 10/23/21   Tonia Ghent, MD  traZODone (DESYREL) 50 MG tablet Take 2 tablets (100 mg total) by mouth at bedtime. 01/30/22   Tonia Ghent, MD  bisoprolol (ZEBETA) 5 MG tablet Take 0.5 tablets (2.5 mg total) by mouth daily. 01/31/19 09/18/19  Belva Crome, MD      Allergies    Doxycycline, Sulfadiazine, and Sulfa antibiotics    Review of Systems   Review of Systems  Physical Exam Updated Vital Signs BP (!) 145/84 (BP Location: Left Arm)   Pulse 79   Temp 98 F (36.7 C) (Oral)   Resp 15   SpO2 100%  Physical Exam  ED Results / Procedures / Treatments   Labs (all labs ordered are listed, but only abnormal results are displayed) Labs  Reviewed - No data to display  EKG None  Radiology CT Head Wo Contrast  Result Date: 02/19/2022 CLINICAL DATA:  Unwitnessed fall, possible head trauma, complains of pain in the right ribcage. History of right breast cancer. EXAM: CT HEAD WITHOUT CONTRAST CT CERVICAL SPINE WITHOUT CONTRAST CT CHEST, ABDOMEN AND PELVIS WITH CONTRAST TECHNIQUE: Contiguous axial images were obtained from the base of the skull through the vertex without intravenous contrast. Multidetector CT imaging of the cervical spine was performed without intravenous contrast. Multiplanar CT image reconstructions were also generated. Multidetector CT imaging of the chest, abdomen and pelvis was performed following the standard protocol during bolus administration of intravenous contrast. RADIATION DOSE REDUCTION: This exam was performed according to the departmental dose-optimization program which includes automated exposure control, adjustment of the mA and/or kV according  to patient size and/or use of iterative reconstruction technique. CONTRAST:  140m OMNIPAQUE IOHEXOL 300 MG/ML  SOLN COMPARISON:  PA and lateral chest and CTA chest, abdomen and pelvis 12/06/2021, chest CT with contrast 09/14/2019, head CT 12/06/2021, and cervical spine CT 12/06/2021. FINDINGS: CT HEAD FINDINGS Brain: There is moderately advanced global atrophy with atrophic ventriculomegaly, moderate to severe small vessel disease of the cerebral white matter, and stable appearance of bifrontal subdural hygromas. There are small chronic lacunar infarctions in both gangliocapsular areas. No asymmetry is seen concerning for an acute infarct, hemorrhage or mass. Chronic lacunar infarct in the superior left cerebellar hemisphere is also redemonstrated. Vascular: There is carotid atherosclerosis without hyperdense central vessels. Skull: Osteopenia. Negative for fracture or focal lesion. No visible scalp hematoma. Sinuses/Orbits: Fluid is again noted posteriorly in the left  sphenoid air cell. There is increased patchy opacification of the ethmoid air cells. The frontal and visualized maxillary sinuses are clear. There is moderate patchy fluid increased throughout the right mastoid air cells with no right middle ear opacity. There has developed extensive fluid throughout the left mastoid air cells and middle ear cavity. No bone destruction is seen. Old lens replacements. Other: None. CT CERVICAL FINDINGS Alignment: Slight dextroscoliosis.  Otherwise normal. Skull base and vertebrae: Osteopenia. No fracture or primary bone lesion is seen. Soft tissues and spinal canal: No prevertebral fluid or swelling. No visible canal hematoma. There is bilateral carotid atherosclerosis. Disc levels: Moderate disc space loss again at C4-5 and C5-6. Trace marginal osteophytes. No significant soft tissue or bony encroachment on the thecal sac is seen. There is a mild nonstenosing posterior disc bulge again noted at C2-3. Facet joint and uncinate hypertrophy left-greater-than-right is seen at most levels, with multilevel left-sided foraminal stenosis greatest at C4-5 and C5-6. There is mild narrowing and spurring at the anterior atlantodental joint. Other:  None. CT CHEST FINDINGS Cardiovascular: Slightly dilated ascending aorta again measures 4.2 cm. There is diffuse aortic atherosclerosis with descending segment tortuosity, stable arch segment measurement 3.8 cm and ulcerative atheromatous plaque in the anterior aspect of the proximal arch, as before. The great vessels show scattered calcification without stenosis or aneurysm. There is no aortic or great vessel dissection. There is mild cardiomegaly with three-vessel calcific CAD. Pulmonary arteries are normal caliber and centrally clear. Pulmonary veins are decompressed. Mediastinum/Nodes: There is no intrathoracic adenopathy or focal tracheal disease. There is no esophageal thickening. Thyroid gland is small and atrophic. There is right axillary  adenopathy with largest lymph node measuring 2.3 x 1.4 cm on 1:14. There are 2 adjacent mildly prominent left axillary lymph nodes not seen previously. Largest of these is 1.6 x 1.1 cm on 1:15. Clinical correlation advised. These lymph nodes were not enlarged on 09/14/2019 and left axillary adenopathy is new since. Lungs/Pleura: Emphysematous and scarring changes are again shown. There is chronic atelectasis or scarring in the posterolateral right lower lobe superimposed on this there is more confluent increased opacity in the area which could be increased regional atelectasis or small pneumonia. Diffuse central bronchial thickening is seen as well as increased amount of scattered small posterior basal bronchiolar impactions. There are increased posterior basal left lower lobe tree-in-bud interstitial changes consistent with small airways disease/bronchiolitis. Findings could be due to interval chronic aspiration. Remaining lungs are generally clear. Musculoskeletal: Osteopenia, levoscoliosis and degenerative changes of the thoracic spine are again noted. There is again noted mild chronic anterior wedging of the T3 and T11 vertebral bodies. Today there is new demonstration of  a mild upper plate anterior wedge compression fracture of the T6 vertebral body with 15% anterior and 5% posterior height loss without retropulsion, and a severe compression fracture of T7 body with posterosuperior cortical retropulsion up to 4 mm mildly encroaching on the ventral cord surface and narrowing the AP diameter of the thecal sac to 8.5 mm, with moderate to severe bilateral T7-8 foraminal stenosis secondarily. There is no displaced fracture in the visualized shoulder girdles, spinal posterior elements of the sternum. There are mildly displaced fractures of the right posterolateral ninth and tenth ribs. No other displaced rib fracture is seen. CT ABDOMEN PELVIS FINDINGS Hepatobiliary: No hepatic injury or perihepatic hematoma.  Gallbladder is unremarkable Pancreas: Atrophic and otherwise unremarkable. Spleen: No splenic injury or perisplenic hematoma. Adrenals/Urinary Tract: No adrenal or renal mass enhancement. There is a small cyst in the posteroinferior left kidney. There is no urinary stone or obstruction. No thickening of the bladder wall. Stomach/Bowel: No dilatation or wall thickening. An appendix is not seen. Vascular/Lymphatic: Extensive aortoiliac calcific plaque noted, fusiform dilatation in the infrarenal aorta up to 4.5 cm which was seen previously, unchanged. No adjacent fat stranding is evident or other evidence of impending rupture. The portal vein is patent. No adenopathy is seen. Reproductive: Massive fibroid uterus again noted: Today's exam measuring 15.4 x 14.3 by 13.5 cm. Other: There are mild mesenteric and body wall congestive features which were noted previously. There is trace ascites in the posterior pelvis which was also seen previously. There is no free air, free hemorrhage or abscess. Musculoskeletal: Osteopenia, dextroscoliosis and degenerative change lumbar spine. No visible regional skeletal fracture or compression injury. IMPRESSION: 1. No acute intracranial CT findings or depressed skull fractures. 2. Chronic atrophy and small-vessel changes with stable bifrontal subdural hygromas. 3. Interval worsening right mastoid effusion with extensive diffuse opacification of left mastoid air cells and left middle ear cavity. Correlate clinically for otomastoiditis. 4. Worsening sinus disease in the ethmoids with continued fluid in the sphenoid sinus. 5. Osteopenia and degenerative change without appreciable cervical fracture. 6. Increased opacity in the right lower lobe which could be due to interval increased atelectasis, pulmonary contusions or infiltrates. 7. Increased scattered small posterior lower lobe bronchiolar impactions and increasing left lower lobe tree-in-bud interstitial change consistent with small  airways disease. Consider aspiration precautions. 8. Mildly displaced fractures of the posterolateral right ninth ribs with acute appearance. 9. Mild upper plate anterior wedge compression fracture of T6 vertebral body new from 12/06/2021, possibly acute. 10. Severe anterior wedge compression fracture of the T7 vertebral body with 4 mm retropulsion encroaching somewhat on the ventral cord surface with mild spinal stenosis and with biforaminal stenosis at T7-8. This could be acute or subacute but it was not seen on 12/06/2021. There is only slight thickening of the paraspinal soft tissues at this level compared to the previous exam. 11. Right axillary adenopathy again noted with interval development of mildly enlarged left axillary lymph nodes. Clinical correlation for lymphoproliferative disease or recurrent right breast cancer versus infectious process recommended. 12. Cardiomegaly with aortic and coronary artery atherosclerosis, 4.2 cm stable dilatation of the ascending aorta, and up to 4.5 cm diffuse aneurysmal dilatation in the abdominal aorta. Recommend imaging follow-up at six-month intervals and vascular consultation if not already done. 13. Again noted atheromatous ulcerative plaque anteriorly in the proximal ascending aorta. No dissection. 14. Massive fibroid uterus. Electronically Signed   By: Telford Nab M.D.   On: 02/19/2022 04:18   CT Cervical Spine Wo Contrast  Result Date: 02/19/2022 CLINICAL DATA:  Unwitnessed fall, possible head trauma, complains of pain in the right ribcage. History of right breast cancer. EXAM: CT HEAD WITHOUT CONTRAST CT CERVICAL SPINE WITHOUT CONTRAST CT CHEST, ABDOMEN AND PELVIS WITH CONTRAST TECHNIQUE: Contiguous axial images were obtained from the base of the skull through the vertex without intravenous contrast. Multidetector CT imaging of the cervical spine was performed without intravenous contrast. Multiplanar CT image reconstructions were also generated.  Multidetector CT imaging of the chest, abdomen and pelvis was performed following the standard protocol during bolus administration of intravenous contrast. RADIATION DOSE REDUCTION: This exam was performed according to the departmental dose-optimization program which includes automated exposure control, adjustment of the mA and/or kV according to patient size and/or use of iterative reconstruction technique. CONTRAST:  179m OMNIPAQUE IOHEXOL 300 MG/ML  SOLN COMPARISON:  PA and lateral chest and CTA chest, abdomen and pelvis 12/06/2021, chest CT with contrast 09/14/2019, head CT 12/06/2021, and cervical spine CT 12/06/2021. FINDINGS: CT HEAD FINDINGS Brain: There is moderately advanced global atrophy with atrophic ventriculomegaly, moderate to severe small vessel disease of the cerebral white matter, and stable appearance of bifrontal subdural hygromas. There are small chronic lacunar infarctions in both gangliocapsular areas. No asymmetry is seen concerning for an acute infarct, hemorrhage or mass. Chronic lacunar infarct in the superior left cerebellar hemisphere is also redemonstrated. Vascular: There is carotid atherosclerosis without hyperdense central vessels. Skull: Osteopenia. Negative for fracture or focal lesion. No visible scalp hematoma. Sinuses/Orbits: Fluid is again noted posteriorly in the left sphenoid air cell. There is increased patchy opacification of the ethmoid air cells. The frontal and visualized maxillary sinuses are clear. There is moderate patchy fluid increased throughout the right mastoid air cells with no right middle ear opacity. There has developed extensive fluid throughout the left mastoid air cells and middle ear cavity. No bone destruction is seen. Old lens replacements. Other: None. CT CERVICAL FINDINGS Alignment: Slight dextroscoliosis.  Otherwise normal. Skull base and vertebrae: Osteopenia. No fracture or primary bone lesion is seen. Soft tissues and spinal canal: No  prevertebral fluid or swelling. No visible canal hematoma. There is bilateral carotid atherosclerosis. Disc levels: Moderate disc space loss again at C4-5 and C5-6. Trace marginal osteophytes. No significant soft tissue or bony encroachment on the thecal sac is seen. There is a mild nonstenosing posterior disc bulge again noted at C2-3. Facet joint and uncinate hypertrophy left-greater-than-right is seen at most levels, with multilevel left-sided foraminal stenosis greatest at C4-5 and C5-6. There is mild narrowing and spurring at the anterior atlantodental joint. Other:  None. CT CHEST FINDINGS Cardiovascular: Slightly dilated ascending aorta again measures 4.2 cm. There is diffuse aortic atherosclerosis with descending segment tortuosity, stable arch segment measurement 3.8 cm and ulcerative atheromatous plaque in the anterior aspect of the proximal arch, as before. The great vessels show scattered calcification without stenosis or aneurysm. There is no aortic or great vessel dissection. There is mild cardiomegaly with three-vessel calcific CAD. Pulmonary arteries are normal caliber and centrally clear. Pulmonary veins are decompressed. Mediastinum/Nodes: There is no intrathoracic adenopathy or focal tracheal disease. There is no esophageal thickening. Thyroid gland is small and atrophic. There is right axillary adenopathy with largest lymph node measuring 2.3 x 1.4 cm on 1:14. There are 2 adjacent mildly prominent left axillary lymph nodes not seen previously. Largest of these is 1.6 x 1.1 cm on 1:15. Clinical correlation advised. These lymph nodes were not enlarged on 09/14/2019 and left axillary adenopathy is new  since. Lungs/Pleura: Emphysematous and scarring changes are again shown. There is chronic atelectasis or scarring in the posterolateral right lower lobe superimposed on this there is more confluent increased opacity in the area which could be increased regional atelectasis or small pneumonia. Diffuse  central bronchial thickening is seen as well as increased amount of scattered small posterior basal bronchiolar impactions. There are increased posterior basal left lower lobe tree-in-bud interstitial changes consistent with small airways disease/bronchiolitis. Findings could be due to interval chronic aspiration. Remaining lungs are generally clear. Musculoskeletal: Osteopenia, levoscoliosis and degenerative changes of the thoracic spine are again noted. There is again noted mild chronic anterior wedging of the T3 and T11 vertebral bodies. Today there is new demonstration of a mild upper plate anterior wedge compression fracture of the T6 vertebral body with 15% anterior and 5% posterior height loss without retropulsion, and a severe compression fracture of T7 body with posterosuperior cortical retropulsion up to 4 mm mildly encroaching on the ventral cord surface and narrowing the AP diameter of the thecal sac to 8.5 mm, with moderate to severe bilateral T7-8 foraminal stenosis secondarily. There is no displaced fracture in the visualized shoulder girdles, spinal posterior elements of the sternum. There are mildly displaced fractures of the right posterolateral ninth and tenth ribs. No other displaced rib fracture is seen. CT ABDOMEN PELVIS FINDINGS Hepatobiliary: No hepatic injury or perihepatic hematoma. Gallbladder is unremarkable Pancreas: Atrophic and otherwise unremarkable. Spleen: No splenic injury or perisplenic hematoma. Adrenals/Urinary Tract: No adrenal or renal mass enhancement. There is a small cyst in the posteroinferior left kidney. There is no urinary stone or obstruction. No thickening of the bladder wall. Stomach/Bowel: No dilatation or wall thickening. An appendix is not seen. Vascular/Lymphatic: Extensive aortoiliac calcific plaque noted, fusiform dilatation in the infrarenal aorta up to 4.5 cm which was seen previously, unchanged. No adjacent fat stranding is evident or other evidence of  impending rupture. The portal vein is patent. No adenopathy is seen. Reproductive: Massive fibroid uterus again noted: Today's exam measuring 15.4 x 14.3 by 13.5 cm. Other: There are mild mesenteric and body wall congestive features which were noted previously. There is trace ascites in the posterior pelvis which was also seen previously. There is no free air, free hemorrhage or abscess. Musculoskeletal: Osteopenia, dextroscoliosis and degenerative change lumbar spine. No visible regional skeletal fracture or compression injury. IMPRESSION: 1. No acute intracranial CT findings or depressed skull fractures. 2. Chronic atrophy and small-vessel changes with stable bifrontal subdural hygromas. 3. Interval worsening right mastoid effusion with extensive diffuse opacification of left mastoid air cells and left middle ear cavity. Correlate clinically for otomastoiditis. 4. Worsening sinus disease in the ethmoids with continued fluid in the sphenoid sinus. 5. Osteopenia and degenerative change without appreciable cervical fracture. 6. Increased opacity in the right lower lobe which could be due to interval increased atelectasis, pulmonary contusions or infiltrates. 7. Increased scattered small posterior lower lobe bronchiolar impactions and increasing left lower lobe tree-in-bud interstitial change consistent with small airways disease. Consider aspiration precautions. 8. Mildly displaced fractures of the posterolateral right ninth ribs with acute appearance. 9. Mild upper plate anterior wedge compression fracture of T6 vertebral body new from 12/06/2021, possibly acute. 10. Severe anterior wedge compression fracture of the T7 vertebral body with 4 mm retropulsion encroaching somewhat on the ventral cord surface with mild spinal stenosis and with biforaminal stenosis at T7-8. This could be acute or subacute but it was not seen on 12/06/2021. There is only slight thickening of  the paraspinal soft tissues at this level  compared to the previous exam. 11. Right axillary adenopathy again noted with interval development of mildly enlarged left axillary lymph nodes. Clinical correlation for lymphoproliferative disease or recurrent right breast cancer versus infectious process recommended. 12. Cardiomegaly with aortic and coronary artery atherosclerosis, 4.2 cm stable dilatation of the ascending aorta, and up to 4.5 cm diffuse aneurysmal dilatation in the abdominal aorta. Recommend imaging follow-up at six-month intervals and vascular consultation if not already done. 13. Again noted atheromatous ulcerative plaque anteriorly in the proximal ascending aorta. No dissection. 14. Massive fibroid uterus. Electronically Signed   By: Telford Nab M.D.   On: 02/19/2022 04:18   CT CHEST ABDOMEN PELVIS W CONTRAST  Result Date: 02/19/2022 CLINICAL DATA:  Unwitnessed fall, possible head trauma, complains of pain in the right ribcage. History of right breast cancer. EXAM: CT HEAD WITHOUT CONTRAST CT CERVICAL SPINE WITHOUT CONTRAST CT CHEST, ABDOMEN AND PELVIS WITH CONTRAST TECHNIQUE: Contiguous axial images were obtained from the base of the skull through the vertex without intravenous contrast. Multidetector CT imaging of the cervical spine was performed without intravenous contrast. Multiplanar CT image reconstructions were also generated. Multidetector CT imaging of the chest, abdomen and pelvis was performed following the standard protocol during bolus administration of intravenous contrast. RADIATION DOSE REDUCTION: This exam was performed according to the departmental dose-optimization program which includes automated exposure control, adjustment of the mA and/or kV according to patient size and/or use of iterative reconstruction technique. CONTRAST:  126m OMNIPAQUE IOHEXOL 300 MG/ML  SOLN COMPARISON:  PA and lateral chest and CTA chest, abdomen and pelvis 12/06/2021, chest CT with contrast 09/14/2019, head CT 12/06/2021, and cervical  spine CT 12/06/2021. FINDINGS: CT HEAD FINDINGS Brain: There is moderately advanced global atrophy with atrophic ventriculomegaly, moderate to severe small vessel disease of the cerebral white matter, and stable appearance of bifrontal subdural hygromas. There are small chronic lacunar infarctions in both gangliocapsular areas. No asymmetry is seen concerning for an acute infarct, hemorrhage or mass. Chronic lacunar infarct in the superior left cerebellar hemisphere is also redemonstrated. Vascular: There is carotid atherosclerosis without hyperdense central vessels. Skull: Osteopenia. Negative for fracture or focal lesion. No visible scalp hematoma. Sinuses/Orbits: Fluid is again noted posteriorly in the left sphenoid air cell. There is increased patchy opacification of the ethmoid air cells. The frontal and visualized maxillary sinuses are clear. There is moderate patchy fluid increased throughout the right mastoid air cells with no right middle ear opacity. There has developed extensive fluid throughout the left mastoid air cells and middle ear cavity. No bone destruction is seen. Old lens replacements. Other: None. CT CERVICAL FINDINGS Alignment: Slight dextroscoliosis.  Otherwise normal. Skull base and vertebrae: Osteopenia. No fracture or primary bone lesion is seen. Soft tissues and spinal canal: No prevertebral fluid or swelling. No visible canal hematoma. There is bilateral carotid atherosclerosis. Disc levels: Moderate disc space loss again at C4-5 and C5-6. Trace marginal osteophytes. No significant soft tissue or bony encroachment on the thecal sac is seen. There is a mild nonstenosing posterior disc bulge again noted at C2-3. Facet joint and uncinate hypertrophy left-greater-than-right is seen at most levels, with multilevel left-sided foraminal stenosis greatest at C4-5 and C5-6. There is mild narrowing and spurring at the anterior atlantodental joint. Other:  None. CT CHEST FINDINGS Cardiovascular:  Slightly dilated ascending aorta again measures 4.2 cm. There is diffuse aortic atherosclerosis with descending segment tortuosity, stable arch segment measurement 3.8 cm and ulcerative atheromatous plaque  in the anterior aspect of the proximal arch, as before. The great vessels show scattered calcification without stenosis or aneurysm. There is no aortic or great vessel dissection. There is mild cardiomegaly with three-vessel calcific CAD. Pulmonary arteries are normal caliber and centrally clear. Pulmonary veins are decompressed. Mediastinum/Nodes: There is no intrathoracic adenopathy or focal tracheal disease. There is no esophageal thickening. Thyroid gland is small and atrophic. There is right axillary adenopathy with largest lymph node measuring 2.3 x 1.4 cm on 1:14. There are 2 adjacent mildly prominent left axillary lymph nodes not seen previously. Largest of these is 1.6 x 1.1 cm on 1:15. Clinical correlation advised. These lymph nodes were not enlarged on 09/14/2019 and left axillary adenopathy is new since. Lungs/Pleura: Emphysematous and scarring changes are again shown. There is chronic atelectasis or scarring in the posterolateral right lower lobe superimposed on this there is more confluent increased opacity in the area which could be increased regional atelectasis or small pneumonia. Diffuse central bronchial thickening is seen as well as increased amount of scattered small posterior basal bronchiolar impactions. There are increased posterior basal left lower lobe tree-in-bud interstitial changes consistent with small airways disease/bronchiolitis. Findings could be due to interval chronic aspiration. Remaining lungs are generally clear. Musculoskeletal: Osteopenia, levoscoliosis and degenerative changes of the thoracic spine are again noted. There is again noted mild chronic anterior wedging of the T3 and T11 vertebral bodies. Today there is new demonstration of a mild upper plate anterior wedge  compression fracture of the T6 vertebral body with 15% anterior and 5% posterior height loss without retropulsion, and a severe compression fracture of T7 body with posterosuperior cortical retropulsion up to 4 mm mildly encroaching on the ventral cord surface and narrowing the AP diameter of the thecal sac to 8.5 mm, with moderate to severe bilateral T7-8 foraminal stenosis secondarily. There is no displaced fracture in the visualized shoulder girdles, spinal posterior elements of the sternum. There are mildly displaced fractures of the right posterolateral ninth and tenth ribs. No other displaced rib fracture is seen. CT ABDOMEN PELVIS FINDINGS Hepatobiliary: No hepatic injury or perihepatic hematoma. Gallbladder is unremarkable Pancreas: Atrophic and otherwise unremarkable. Spleen: No splenic injury or perisplenic hematoma. Adrenals/Urinary Tract: No adrenal or renal mass enhancement. There is a small cyst in the posteroinferior left kidney. There is no urinary stone or obstruction. No thickening of the bladder wall. Stomach/Bowel: No dilatation or wall thickening. An appendix is not seen. Vascular/Lymphatic: Extensive aortoiliac calcific plaque noted, fusiform dilatation in the infrarenal aorta up to 4.5 cm which was seen previously, unchanged. No adjacent fat stranding is evident or other evidence of impending rupture. The portal vein is patent. No adenopathy is seen. Reproductive: Massive fibroid uterus again noted: Today's exam measuring 15.4 x 14.3 by 13.5 cm. Other: There are mild mesenteric and body wall congestive features which were noted previously. There is trace ascites in the posterior pelvis which was also seen previously. There is no free air, free hemorrhage or abscess. Musculoskeletal: Osteopenia, dextroscoliosis and degenerative change lumbar spine. No visible regional skeletal fracture or compression injury. IMPRESSION: 1. No acute intracranial CT findings or depressed skull fractures. 2.  Chronic atrophy and small-vessel changes with stable bifrontal subdural hygromas. 3. Interval worsening right mastoid effusion with extensive diffuse opacification of left mastoid air cells and left middle ear cavity. Correlate clinically for otomastoiditis. 4. Worsening sinus disease in the ethmoids with continued fluid in the sphenoid sinus. 5. Osteopenia and degenerative change without appreciable cervical fracture. 6.  Increased opacity in the right lower lobe which could be due to interval increased atelectasis, pulmonary contusions or infiltrates. 7. Increased scattered small posterior lower lobe bronchiolar impactions and increasing left lower lobe tree-in-bud interstitial change consistent with small airways disease. Consider aspiration precautions. 8. Mildly displaced fractures of the posterolateral right ninth ribs with acute appearance. 9. Mild upper plate anterior wedge compression fracture of T6 vertebral body new from 12/06/2021, possibly acute. 10. Severe anterior wedge compression fracture of the T7 vertebral body with 4 mm retropulsion encroaching somewhat on the ventral cord surface with mild spinal stenosis and with biforaminal stenosis at T7-8. This could be acute or subacute but it was not seen on 12/06/2021. There is only slight thickening of the paraspinal soft tissues at this level compared to the previous exam. 11. Right axillary adenopathy again noted with interval development of mildly enlarged left axillary lymph nodes. Clinical correlation for lymphoproliferative disease or recurrent right breast cancer versus infectious process recommended. 12. Cardiomegaly with aortic and coronary artery atherosclerosis, 4.2 cm stable dilatation of the ascending aorta, and up to 4.5 cm diffuse aneurysmal dilatation in the abdominal aorta. Recommend imaging follow-up at six-month intervals and vascular consultation if not already done. 13. Again noted atheromatous ulcerative plaque anteriorly in the  proximal ascending aorta. No dissection. 14. Massive fibroid uterus. Electronically Signed   By: Telford Nab M.D.   On: 02/19/2022 04:18   CT L-SPINE NO CHARGE  Result Date: 02/19/2022 CLINICAL DATA:  Initial evaluation for acute trauma, fall. EXAM: CT LUMBAR SPINE WITHOUT CONTRAST TECHNIQUE: Multidetector CT imaging of the lumbar spine was performed without intravenous contrast administration. Multiplanar CT image reconstructions were also generated. RADIATION DOSE REDUCTION: This exam was performed according to the departmental dose-optimization program which includes automated exposure control, adjustment of the mA and/or kV according to patient size and/or use of iterative reconstruction technique. COMPARISON:  Prior radiograph from 12/21/2021. FINDINGS: Segmentation: Bones are markedly osteopenic, somewhat limiting assessment for possible subtle nondisplaced fractures. Transitional features seen about the lumbosacral junction. For the purposes of this report, lowest full rib-bearing vertebral body is labeled T12. There is partial sacralization of the L5 vertebral body. Alignment: Sigmoid scoliosis.  No significant listhesis. Vertebrae: Chronic compression deformity of T11 noted. Vertebral body height otherwise maintained without acute fracture. Visualized sacrum and pelvis grossly intact. SI joints symmetric and within normal limits. Partially visualized lower ribs grossly intact. No discrete or worrisome osseous lesions. Paraspinal and other soft tissues: Paraspinous soft tissues demonstrate no acute finding. Markedly enlarged fibroid uterus noted. Intra-abdominal aneurysm measuring up to 4.6 cm in diameter noted, better evaluated on concomitant CT of the abdomen and pelvis. Disc levels: Underlying mild-to-moderate degenerative spondylosis, most pronounced at T12-L1 and L3-4. Scattered multilevel facet hypertrophy. No significant spinal stenosis by CT. IMPRESSION: 1. No acute traumatic injury within the  lumbar spine. 2. Chronic compression deformity of T11. 3. Intra-abdominal aneurysm measuring up to 4.6 cm in diameter, better evaluated on concomitant CT of the abdomen and pelvis. Recommend follow-up every 6 months and vascular consultation. This recommendation follows ACR consensus guidelines: White Paper of the ACR Incidental Findings Committee II on Vascular Findings. J Am Coll Radiol 2013; 10:789-794. 4. Markedly enlarged fibroid uterus. Aortic Atherosclerosis (ICD10-I70.0). Electronically Signed   By: Jeannine Boga M.D.   On: 02/19/2022 03:33   DG Femur Min 2 Views Right  Result Date: 02/19/2022 CLINICAL DATA:  Status post fall. EXAM: RIGHT FEMUR 2 VIEWS COMPARISON:  Dec 06, 2021 FINDINGS: There is no  evidence of an acute fracture or dislocation. A right knee replacement is seen without evidence of surrounding lucency to suggest the presence of hardware loosening or infection. Soft tissues are unremarkable. IMPRESSION: 1. Intact right knee replacement. 2. No acute osseous abnormality. Electronically Signed   By: Virgina Norfolk M.D.   On: 02/19/2022 01:03   DG Ribs Unilateral W/Chest Right  Result Date: 02/19/2022 CLINICAL DATA:  Right rib pain after fall EXAM: RIGHT RIBS AND CHEST - 3+ VIEW COMPARISON:  Radiographs 02/13/2022 FINDINGS: Demineralization limits sensitivity for subtle fractures. No displaced right rib fractures. Right TSA. Calcified tortuous aorta. Coronary stenting. Unchanged marked compression of a midthoracic vertebral body. IMPRESSION: Marked demineralization.  No definite displaced rib fractures. Electronically Signed   By: Placido Sou M.D.   On: 02/19/2022 00:56   DG Pelvis 1-2 Views  Result Date: 02/19/2022 CLINICAL DATA:  Status post fall. EXAM: PELVIS - 1-2 VIEW COMPARISON:  February 13, 2022 FINDINGS: There is no evidence of pelvic fracture or diastasis. No pelvic bone lesions are seen. IMPRESSION: Negative. Electronically Signed   By: Virgina Norfolk M.D.   On:  02/19/2022 00:55    Procedures Procedures  {Document cardiac monitor, telemetry assessment procedure when appropriate:1}  Medications Ordered in ED Medications - No data to display  ED Course/ Medical Decision Making/ A&P                           Medical Decision Making  ***  {Document critical care time when appropriate:1} {Document review of labs and clinical decision tools ie heart score, Chads2Vasc2 etc:1}  {Document your independent review of radiology images, and any outside records:1} {Document your discussion with family members, caretakers, and with consultants:1} {Document social determinants of health affecting pt's care:1} {Document your decision making why or why not admission, treatments were needed:1} Final Clinical Impression(s) / ED Diagnoses Final diagnoses:  None    Rx / DC Orders ED Discharge Orders     None

## 2022-02-20 NOTE — Assessment & Plan Note (Signed)
   Primarily thought to be secondary to hypercapnic encephalopathy with PCO2 of 115 on VBG  Managing respiratory failure as above  Remainder of assessment and plan as above

## 2022-02-20 NOTE — Telephone Encounter (Signed)
Patients daughter [name] called and stated patient is in so much pain and needs some pain medication called in. She can't even get out of bed. She has an appt today but they may not make it without pain medication since she can not get her up. I did ask if we could do a VV with her if they could not come in and she said that would be good to do if possible. Please advise.

## 2022-02-20 NOTE — Assessment & Plan Note (Signed)
.   Continuing home regimen of lipid lowering therapy.  

## 2022-02-20 NOTE — Assessment & Plan Note (Signed)
   Daily anastrozole

## 2022-02-20 NOTE — Telephone Encounter (Signed)
I think the best option is video visit prior to changing her meds.

## 2022-02-20 NOTE — Assessment & Plan Note (Signed)
   Patient presenting with 36-hour history of progressive worsening confusion and somewhat worsening shortness of breath after suffering a fall with fractures of the ninth and 10th ribs  On exam, expiratory wheezing noted with rales in the right mid and lower fields  Concern for progressively worsening hypoxic and hypercapnic respiratory failure, likely multifactorial in etiology  Patient has been taking shallow breaths since her rib fractures.  In addition, CT imaging from her prior presentation in the ED reveals possible developing right lower lobe infiltrate concerning for aspiration pneumonia.  Finally, with wheezing on exam there may be an element of COPD exacerbation.  We will attempt a trial of BiPAP therapy.  To attempt to bring patient's PCO2 to baseline which is approximately 70-80  If patient is unwilling to keep this on due to her degree of encephalopathy we may have to resort to high flow oxygen delivery  Treating with intravenous antibiotics for suspected pneumonia as well as scheduled bronchodilator therapy and Solu-Medrol for suspected COPD exacerbation  Close clinical monitoring in stepdown as patient is at high risk of clinical decompensation  I have confirmed with the daughter at the bedside the patient is DNR and DNI

## 2022-02-20 NOTE — Assessment & Plan Note (Signed)
.   Resume home regimen of Synthroid 

## 2022-02-20 NOTE — Telephone Encounter (Signed)
Called patients daughter and changed appt to VV. Advised we will not be changing medications until we can see her.

## 2022-02-20 NOTE — Assessment & Plan Note (Signed)
·   Please see assessment and plan above °

## 2022-02-20 NOTE — Assessment & Plan Note (Signed)
   Patient has a history of dysphagia in the past and was evaluated by speech therapy during her June hospitalization  CT imaging performed on last ED presentation revealing a right lower lobe infiltrate with concern for aspiration pneumonia.  Chest x-ray performed on this presentation does not appreciate the infiltrate.  SLP evaluation ordered for the morning  N.p.o. for now except for sips of clears and advance  Remainder of assessment and plan as above

## 2022-02-20 NOTE — Patient Outreach (Signed)
  Care Coordination   Follow Up Visit Note   02/20/2022 Name: [name] MRN: [id number] DOB: 07-09-1931  [name] is a 88 y.o. year old female who sees Tonia Ghent, MD for primary care. I spoke with pt's daughter/HCPOA, Cherly Anderson by phone who reports additional falls, ED visits and concerns related to her cognitive state; including hallucinations, etc.  Contacted Dr Damita Dunnings to alert to need for [facility] completion for possible placement. Pt to be seen by Dr Damita Dunnings today.  What matters to the patients health and wellness today?  Adequate care/support in place.    Goals Addressed               This Visit's Progress     Find Help in My Community (pt-stated)        Timeframe:  Short-Term Goal Priority:  High Start Date:       01/12/22                     Expected End Date:                 03/07/22    Follow Up Date 02/21/22  -continue with in-home private duty caregiver/companion as needed -continue with steps for placement  -continue with Home Health PT/OT etc -review [employer] benefits- call to seek info as needed -review [employer] program for eligibility - begin a notebook of services in my neighborhood or community - call 211 when I need some help - follow-up on any referrals for help I am given - think ahead to make sure my need does not become an emergency - make a note about what I need to have by the phone or take with me, like an identification card or social security number have a back-up plan - have a back-up plan - make a list of family or friends that I can call    Why is this important?   Knowing how and where to find help for yourself or family in your neighborhood and community is an important skill.  You will want to take some steps to learn how.    Notes:         SDOH assessments and interventions completed:  Yes     Care Coordination Interventions Activated:  Yes  Care Coordination Interventions:   Yes, provided   Follow up plan: Follow up call scheduled for 02/21/22    Encounter Outcome:  Pt. Visit Completed

## 2022-02-20 NOTE — ED Triage Notes (Signed)
Pt coming from home via EMS with increased confusion since Sunday night. Per family, confused at baseline. Hx dementia. Hx of UTIs. Pt on 2-4 L [place] O2 at baseline. Pt currently A&Ox3.

## 2022-02-20 NOTE — H&P (Addendum)
History and Physical    Patient: [name] MRN: [id number] DOA: 02/20/2022  Date of Service: the patient was seen and examined on 02/21/2022  Patient coming from: ALF/ILF  Chief Complaint:  Chief Complaint  Patient presents with   Altered Mental Status    HPI:   88 year old female with past medical history of COPD with chronic respiratory failure (3lpm via [place] at all times), systolic congestive heart failure (Echo 01/2019 EF 40-45%), coronary artery disease (STEMI 01/2019 with DES to RCA), right breast cancer (ER positive invasive ductal carcinoma on Anastrozole), gastroesophageal reflux disease, hyperlipidemia, anemia of chronic disease, hypothyroidism and mild dementia who presents to [facility] emergency department via EMS with increasing confusion.  Of note, patient presented to [facility] emergency department the evening of 8/13 status post fall at home upon attempting to get out of bed.  Patient immediately began to experience right-sided chest pain, severe in intensity, nonradiating and worse with deep inspiration.  At that time, patient was denying shortness of breath and according to the EDP there is no evidence of confusion.  Patient underwent trauma survey via CT imaging revealing mildly displaced fractures of the posterior right ninth and 10th ribs, compression fracture of T6 as well as anterior wedge compression fracture of T7.  More accurate detailed interpretation of the CT is in the body of the radiology report.  Patient was treated with fentanyl, Motrin and Tylenol.  Patient was not requiring any additional oxygen, was mentating at baseline and stated her symptoms were controlled and the patient was discharged home.  Since discharge the patient has experienced progressively worsening confusion.  This has been associated with increasing agitation and diffuse pain.  According to discussion with family patient has not exhibited any new fevers.  Patient had a  telemedicine visit with her primary care provider earlier in the day on 8/15 and due to concerns over the patient's increasing agitation and confusion patient was brought back to [facility] emergency department for repeat evaluation.  Upon evaluation in the emergency department patient was found to be agitated and intermittently following commands.  VBG was performed revealing pH of 7.32 with PCO2 of 115.  Patient's baseline CO2 is approximately 70.  Patient was thought to be suffering from acute on chronic respiratory failure with hypercapnia and therefore BiPAP therapy was initiated.  The hospitalist group was then called to assess the patient for admission to the hospital.  Review of Systems: Review of Systems  Unable to perform ROS: Mental status change     Past Medical History:  Diagnosis Date   Abdominal pain 04/05/2017   Acute ST elevation myocardial infarction (STEMI) of inferior wall ([place]) 01/29/2019   100% mRCA - DES PCI   Allergic rhinitis, cause unspecified    Allergy, unspecified not elsewhere classified    Anemia, unspecified 04/10/2008   Qualifier: Diagnosis of  By: Linna Darner MD, William     Anxiety    Anxiety state 03/01/2008   Qualifier: Diagnosis of  By: Council Mechanic MD, Hilaria Ota    Asthma    Breast cancer ([place]) 08/2018   right breast   Breast mass 08/28/2018   Chronic kidney disease    frequency   COMMON MIGRAINE 12/24/2006   Qualifier: Diagnosis of  By: Fuller Plan CMA (AAMA), [place]     COPD (chronic obstructive pulmonary disease) ([place])    Degeneration of intervertebral disc, site unspecified    Diverticulosis of colon (without mention of hemorrhage)    DNR (do not resuscitate)  09/08/2019   Dysphagia 02/18/2019   Encounter for chronic pain management 07/26/2016   Indication for chronic opioid: chronic back pain Medication and dose:  Oxycodone '10mg'$  TID # pills per month: 90 Last UDS date: 04/15/15 Pain contract signed (Y/N):  yes Date narcotic database last reviewed  (include red flags):  05/06/17   Esophageal reflux    Fibromyalgia 12/24/2006   Qualifier: Diagnosis of  By: Fuller Plan CMA (AAMA), Lugene     Headache    HTN (hypertension) 06/09/2013   Hyperlipidemia with target LDL less than 70 12/24/2006   Qualifier: Diagnosis of  By: Fuller Plan CMA ([place]), Lugene     Hypokalemia 05/24/2009   Qualifier: Diagnosis of  By: Lacretia Nicks     Hypothyroidism 12/24/2006   Qualifier: Diagnosis of  By: Fuller Plan CMA (AAMA), Lugene     Insomnia 12/24/2006   she had trials of antihistamines and TCAs prev w/o effect.      Insomnia, unspecified    Lower GI bleeding 09/09/2019   Myalgia and myositis, unspecified    Need for prophylactic hormone replacement therapy (postmenopausal)    Osteoarthritis of right knee 08/27/2011   R knee pain, s/p arthroscopy 2012 per [name]    Osteoporosis 06/23/2007   Qualifier: Diagnosis of  By: Council Mechanic MD, Hilaria Ota    Osteoporosis, unspecified    Other abnormal blood chemistry    Other and unspecified hyperlipidemia    Other chronic pain    Presence of drug coated stent in right coronary artery 01/30/2019   Stroke [facility])    mini strokes   Syncope 01/24/2017   Thoracic aortic aneurysm ([place]) 04/10/2017   Unspecified essential hypertension    Unspecified hypothyroidism    Vitamin D deficiency 09/26/2016    Past Surgical History:  Procedure Laterality Date   BACK SURGERY     breast cystectomy     CORONARY/GRAFT ACUTE MI REVASCULARIZATION N/A 01/29/2019   Procedure: CORONARY/GRAFT ACUTE MI REVASCULARIZATION;  Surgeon: Belva Crome, MD;  Location: [place] [facility];  Service: Cardiovascular;  Laterality: N/A;   JOINT REPLACEMENT     shoulder right   KNEE ARTHROSCOPY     Right knee 2012   MASS EXCISION Left 04/07/2015   Procedure: MINOR EXCISION OF MASS LEFT SMALL FINGER;  Surgeon: Leanora Cover, MD;  Location: [place];  Service: Orthopedics;  Laterality: Left;   SHOULDER SURGERY     TOTAL KNEE ARTHROPLASTY   12/11/2011   Procedure: TOTAL KNEE ARTHROPLASTY;  Surgeon: Johnny Bridge, MD;  Location: [place];  Service: Orthopedics;  Laterality: Right;   UMBILICAL HERNIA REPAIR N/A 06/09/2013   Procedure: EXPLORATIORY LAPAROTOMY, HERNIA REPAIR UMBILICAL, INSERTION OF MESH;  Surgeon: Haywood Lasso, MD;  Location: [place];  Service: General;  Laterality: N/A;    Social History:  reports that she quit smoking about 36 years ago. Her smoking use included cigarettes. She has a 15.00 pack-year smoking history. She has never used smokeless tobacco. She reports that she does not drink alcohol and does not use drugs.  Allergies  Allergen Reactions   Doxycycline Other (See Comments)    GI upset   Sulfadiazine Other (See Comments)    Fever, aches   Sulfa Antibiotics Rash    Family History  Problem Relation Age of Onset   Lung cancer Brother        x2   Hypertension Father    Heart disease Father    Heart disease Mother    Asthma Sister  Prior to Admission medications   Medication Sig Start Date End Date Taking? Authorizing Provider  acetaminophen (TYLENOL) 500 MG tablet Take 1,000 mg by mouth every 6 (six) hours as needed for mild pain or headache.    [provider]  albuterol (PROAIR HFA) 108 (90 Base) MCG/ACT inhaler Inhale 1-2 puffs into the lungs every 6 (six) hours as needed for wheezing or shortness of breath (Okay to fill with Ventolin or albuterol HFA). 10/30/19   Tonia Ghent, MD  alendronate (FOSAMAX) 70 MG tablet Take 1 tablet (70 mg total) by mouth every Monday. 10/02/21   Benay Pike, MD  ALPRAZolam Duanne Moron) 0.5 MG tablet Take 0.5 mg by mouth daily as needed for anxiety or sleep. 01/01/22   [provider]  anastrozole (ARIMIDEX) 1 MG tablet Take 1 tablet (1 mg total) by mouth daily. 10/05/20   Magrinat, Virgie Dad, MD  busPIRone (BUSPAR) 5 MG tablet TAKE ONE TABLET BY MOUTH IN THE MORNING AND IN THE EVENING Patient taking differently: Take 5 mg by mouth 2 (two)  times daily. 01/18/22   Tonia Ghent, MD  Calcium Carbonate (CALTRATE 600 PO) Take 600 mg by mouth at bedtime.    [provider]  cholecalciferol (VITAMIN D3) 25 MCG (1000 UNIT) tablet Take 1,000 Units by mouth at bedtime.    [provider]  donepezil (ARICEPT) 5 MG tablet TAKE ONE TABLET BY MOUTH AT BEDTIME 02/16/22   Tonia Ghent, MD  DULoxetine (CYMBALTA) 60 MG capsule TAKE ONE CAPSULE BY MOUTH DAILY Patient taking differently: Take 60 mg by mouth daily. 12/22/21   Tonia Ghent, MD  Ensure (ENSURE) Take 237 mLs by mouth 2 (two) times daily between meals.    [provider]  ferrous sulfate (FERROUSUL) 325 (65 FE) MG tablet Take 1 tablet (325 mg total) by mouth daily with breakfast. 08/22/18   Tonia Ghent, MD  fluticasone (FLONASE) 50 MCG/ACT nasal spray PLACE 2 SPRAYS INTO BOTH NOSTRILS DAILY. Patient taking differently: Place 2 sprays into both nostrils daily as needed for allergies. 01/28/20   Tonia Ghent, MD  ibuprofen (ADVIL) 600 MG tablet Take 1 tablet (600 mg total) by mouth every 6 (six) hours as needed. 02/19/22   Jeanell Sparrow, DO  ipratropium-albuterol (DUONEB) 0.5-2.5 (3) MG/3ML SOLN Take 3 mLs by nebulization 2 (two) times daily. 12/13/21   Florencia Reasons, MD  levothyroxine (SYNTHROID) 112 MCG tablet TAKE ONE TABLET BY MOUTH DAILY Patient taking differently: Take 112 mcg by mouth daily before breakfast. 11/08/21   Tonia Ghent, MD  lidocaine (LIDODERM) 5 % Place 1 patch onto the skin daily as needed. Remove & Discard patch within 12 hours or as directed by MD 02/19/22   Jeanell Sparrow, DO  methocarbamol (ROBAXIN) 500 MG tablet Take 1 tablet (500 mg total) by mouth 2 (two) times daily for 5 days. 02/19/22 02/24/22  Jeanell Sparrow, DO  Multiple Vitamin (MULTIVITAMIN WITH MINERALS) TABS tablet Take 1 tablet by mouth daily.    [provider]  Multiple Vitamins-Minerals (OCUVITE PRESERVISION PO) Take 1 tablet by mouth at bedtime.    [provider]  nitroGLYCERIN (NITROSTAT) 0.4 MG SL tablet Place 1 tablet (0.4 mg total) under the tongue every 5 (five) minutes as needed for chest pain. 02/23/20   Belva Crome, MD  OXYGEN Inhale 3-3.5 L into the lungs continuous.    [provider]  pantoprazole (PROTONIX) 20 MG tablet TAKE ONE TABLET BY MOUTH  TWICE DAILY Patient taking differently: Take 20 mg by mouth 2 (two) times daily. 10/23/21   Tonia Ghent, MD  polyethylene glycol (MIRALAX / GLYCOLAX) 17 g packet Take 17 g by mouth daily as needed for moderate constipation. 12/13/21   Florencia Reasons, MD  rosuvastatin (CRESTOR) 10 MG tablet Take 1 tablet (10 mg total) by mouth daily. 10/23/21   Tonia Ghent, MD  senna-docusate (SENOKOT-S) 8.6-50 MG tablet Take 1 tablet by mouth at bedtime. 12/13/21   Florencia Reasons, MD  SPIRIVA HANDIHALER 18 MCG inhalation capsule INHALE THE CONTENTS OF ONE CAPSULE VIA HANDIHALER DAILY Patient taking differently: Place 18 mcg into inhaler and inhale daily as needed (shortness of breath). 10/23/21   Tonia Ghent, MD  traZODone (DESYREL) 50 MG tablet Take 2 tablets (100 mg total) by mouth at bedtime. 01/30/22   Tonia Ghent, MD  bisoprolol (ZEBETA) 5 MG tablet Take 0.5 tablets (2.5 mg total) by mouth daily. 01/31/19 09/18/19  Belva Crome, MD    Physical Exam:  Vitals:   02/20/22 2300 02/20/22 2301 02/20/22 2314 02/21/22 0000  BP:      Pulse: 84 75 83   Resp: (!) 22 (!) 21 20   Temp:    99.7 F (37.6 C)  TempSrc:    Axillary  SpO2: 100% 100% 100%     Constitutional: Lethargic but arousable, oriented x2 no associated distress.   Skin: no rashes, no lesions, poor skin turgor noted. Eyes: Pupils are equally reactive to light.  No evidence of scleral icterus or conjunctival pallor.  ENMT: BiPAP mask in place, moist mucous membranes noted.   Neck: normal, supple, no masses, no thyromegaly.  No evidence of jugular venous distension.   Respiratory: Notable rales in the right mid and lower fields  with intermittent expiratory wheezing noted on exam.  Increased respiratory effort without accessory muscle use.  Cardiovascular: Regular rate and rhythm, no murmurs / rubs / gallops. No extremity edema. 2+ pedal pulses. No carotid bruits.  Chest:   Notable right-sided chest tenderness without evidence of  crepitus or deformity.   Back:   Nontender without crepitus or deformity. Abdomen: Abdomen is soft and nontender.  No evidence of intra-abdominal masses.  Positive bowel sounds noted in all quadrants.   Musculoskeletal: No joint deformity upper and lower extremities. Good ROM, no contractures. Normal muscle tone.  Neurologic: Patient is extremely hard of hearing. . Sensation intact.  Patient moving all 4 extremities spontaneously.  Patient is intermittently following unable to fully commands.  Patient is responsive to verbal stimuli.   Psychiatric: Assess due to encephalopathy.  Patient currently does not seem to possess insight as to her current situation.    Data Reviewed:  I have personally reviewed and interpreted labs, imaging.  Significant findings are: VBG revealing patient 7.32, PCO2 of 115 Troponin 22 CBC revealing white blood cell count of 5.6, hemoglobin 10.9, hematocrit 36.9, platelet count of 143 Chemistry revealing sodium 144, potassium 3.5, chloride 95, bicarbonate 42, BUN 16, creatinine 0.7 Urinalysis revealing specific gravity of 1.039 with trace leukocytes and 0-5 white blood cells per high-powered field.  No bacteria seen.   EKG: Personally reviewed.  Rhythm is normal sinus rhythm without dynamic ST segment change.   Assessment and Plan: * Acute on chronic respiratory failure with hypoxia and hypercapnia (HCC) Patient presenting with 36-hour history of progressive worsening confusion and somewhat worsening shortness of breath after suffering a fall with fractures of the ninth and 10th ribs On exam,  expiratory wheezing noted with rales in the right mid and lower  fields Concern for progressively worsening hypoxic and hypercapnic respiratory failure, likely multifactorial in etiology Patient has been taking shallow breaths since her rib fractures.  In addition, CT imaging from her prior presentation in the ED reveals possible developing right lower lobe infiltrate concerning for aspiration pneumonia (although chest x-ray on this presentation does not appreciate the infiltrate.).  Finally, with wheezing on exam there may be an element of COPD exacerbation. We will attempt a trial of BiPAP therapy.  To attempt to bring patient's PCO2 to baseline which is approximately 70-80 If patient is unwilling to keep this on due to her degree of encephalopathy we may have to resort to high flow oxygen delivery Treating with intravenous antibiotics for suspected pneumonia as well as scheduled bronchodilator therapy and Solu-Medrol for suspected COPD exacerbation Close clinical monitoring in stepdown as patient is at high risk of clinical decompensation I have confirmed with the daughter at the bedside the patient is DNR and DNI  COPD with acute exacerbation ([place]) Please see assessment and plan above  Aspiration pneumonia of right lower lobe [facility]) Patient has a history of dysphagia in the past and was evaluated by speech therapy during her June hospitalization CT imaging performed on last ED presentation revealing a right lower lobe infiltrate with concern for aspiration pneumonia.  Chest x-ray performed on this presentation does not appreciate the infiltrate. SLP evaluation ordered for the morning N.p.o. for now except for sips of clears and advance Remainder of assessment and plan as above  Acute metabolic encephalopathy Primarily thought to be secondary to hypercapnic encephalopathy with PCO2 of 115 on VBG Managing respiratory failure as above Remainder of assessment and plan as above  Multiple fractures of ribs, right side, subsequent encounter for fracture with  routine healing Identified on CT imaging of the chest during ED visit on 8/13 Rib fractures not redemonstrated on x-ray of the chest on presentation to the emergency department today We will avoid opiates for now, treating patient with Lidoderm patch Will place on Tylenol twice daily We will pain vitamin D level Supportive care otherwise, please see notations above concerning associated respiratory failure   Coronary artery disease involving native coronary artery of native heart without angina pectoris Patient is complaining of chest pain although this is likely musculoskeletal due to rib fractures and is reproducible on palpation Monitoring patient on telemetry Continue home regimen of statin therapy As needed nitroglycerin for ischemic chest pain Daily antiplatelet therapy   Elevated troponin level not due myocardial infarction Slightly elevated serial troponins with flat trajectory of elevation Chest pain is likely musculoskeletal in origin Likely secondary to underlying illness, plaque rupture is unlikely Monitoring patient on telemetry   History of right breast cancer Daily anastrozole  Acquired hypothyroidism Resume home regimen of Synthroid    Mixed hyperlipidemia Continuing home regimen of lipid lowering therapy.   GERD without esophagitis Continuing home regimen of daily PPI therapy, intravenous for now   Dementia with behavioral disturbance ([place]) Underlying history of dementia complicates and exacerbates patient's presentation Managing bouts of extreme agitation with as needed Haldol Encouraging family to remain at bedside is much as possible Frequent redirection by staff Fall precautions   Goals of care, counseling/discussion Confirmed DNR/DNI status on admission with daughter Prognosis guarded       Code Status:  DNR  code status decision has been confirmed with: daugther Family Communication: Daughter is at the bedside and has been updated on  plan of care  Consults: None  Severity of Illness:  The appropriate patient status for this patient is INPATIENT. Inpatient status is judged to be reasonable and necessary in order to provide the required intensity of service to ensure the patient's safety. The patient's presenting symptoms, physical exam findings, and initial radiographic and laboratory data in the context of their chronic comorbidities is felt to place them at high risk for further clinical deterioration. Furthermore, it is not anticipated that the patient will be medically stable for discharge from the hospital within 2 midnights of admission.   * I certify that at the point of admission it is my clinical judgment that the patient will require inpatient hospital care spanning beyond 2 midnights from the point of admission due to high intensity of service, high risk for further deterioration and high frequency of surveillance required.*  Author:  Vernelle Emerald MD  02/21/2022 12:42 AM

## 2022-02-20 NOTE — Assessment & Plan Note (Signed)
   Confirmed DNR/DNI status on admission with daughter  Prognosis guarded

## 2022-02-20 NOTE — Progress Notes (Unsigned)
Virtual visit completed through WebEx or similar program Patient location: home  Provider location: [place] at [facility], office  Participants: Patient and me (unless stated otherwise below)  Pandemic considerations d/w pt.   Limitations and rationale for visit method d/w patient.  Patient's daughter agreed to proceed.   NI:DPOEUM up.   History of Present Illness:   Daughter [name] on visit with patient.  Daughter [name] is out of town.  [name] is staying with patient at night and has a caregiver during the day.   Patient has 2 rib fractures on the right, 9 and 10. She has multiple compression fractures of her spine.    Patient has been more confused per family report, can't get out of bed without help.  She appears confused and didn't recognize me on the call.  She is in bed with O2 via [place].  She is talking but not able to participate in conversation .   It doesn't appear that she can make decisions at this point.  Talked with [name] and we agreed that she wasn't safe to stay at home.  We agreed that she needed ER eval and likely inpatient tx for symptom management.  If she improves she likely needs placement.  Without improvement, she may need hospice eval.    I called 911, gave address and details.  Operator advised me to have [place] call 911 from location.  I called and LMOVM for [facility].    I called [name] and she agreed with plan re: EMS.    Observations/Objective: See above.   Assessment and Plan: Confusion, functional decline.  Patient has been more confused per family report, can't get out of bed without help.  She appears confused and didn't recognize me on the call.  She is in bed with O2 via [place].  She is talking but not able to participate in conversation .   It doesn't appear that she can make decisions at this point.  Talked with [name] and we agreed that she wasn't safe to stay at home.  We agreed that she needed ER eval and likely inpatient tx for symptom management.  If she  improves she likely needs placement.  Without improvement, she may need hospice eval.    I called 911, gave address and details.  Operator advised me to have [place] call 911 from location.  I called and LMOVM for [facility].    I called [name] and she agreed with plan re: EMS.    I called back and talked to [facility].  Per her report, EMS is already on site so I think it makes sense to defer calling 911 back.

## 2022-02-20 NOTE — ED Notes (Signed)
Bed alarm placed for pt's safety.

## 2022-02-20 NOTE — Assessment & Plan Note (Signed)
.   Continuing home regimen of daily PPI therapy, intravenous for now

## 2022-02-20 NOTE — Assessment & Plan Note (Signed)
   Slightly elevated serial troponins with flat trajectory of elevation  Chest pain is likely musculoskeletal in origin  Likely secondary to underlying illness, plaque rupture is unlikely  Monitoring patient on telemetry

## 2022-02-21 ENCOUNTER — Ambulatory Visit: Payer: Self-pay | Admitting: *Deleted

## 2022-02-21 DIAGNOSIS — J9621 Acute and chronic respiratory failure with hypoxia: Secondary | ICD-10-CM | POA: Diagnosis not present

## 2022-02-21 DIAGNOSIS — R410 Disorientation, unspecified: Secondary | ICD-10-CM | POA: Insufficient documentation

## 2022-02-21 DIAGNOSIS — J9622 Acute and chronic respiratory failure with hypercapnia: Secondary | ICD-10-CM | POA: Diagnosis not present

## 2022-02-21 LAB — COMPREHENSIVE METABOLIC PANEL
ALT: 10 U/L (ref 0–44)
AST: 25 U/L (ref 15–41)
Albumin: 3 g/dL — ABNORMAL LOW (ref 3.5–5.0)
Alkaline Phosphatase: 37 U/L — ABNORMAL LOW (ref 38–126)
Anion gap: 9 (ref 5–15)
BUN: 17 mg/dL (ref 8–23)
CO2: 37 mmol/L — ABNORMAL HIGH (ref 22–32)
Calcium: 8.7 mg/dL — ABNORMAL LOW (ref 8.9–10.3)
Chloride: 97 mmol/L — ABNORMAL LOW (ref 98–111)
Creatinine, Ser: 0.49 mg/dL (ref 0.44–1.00)
GFR, Estimated: >60 mL/min (ref >60)
Glucose, Bld: 149 mg/dL — ABNORMAL HIGH (ref 70–99)
Potassium: 3.7 mmol/L (ref 3.5–5.1)
Sodium: 143 mmol/L (ref 135–145)
Total Bilirubin: 0.8 mg/dL (ref 0.3–1.2)
Total Protein: 6.2 g/dL — ABNORMAL LOW (ref 6.5–8.1)

## 2022-02-21 LAB — CBC WITH DIFFERENTIAL/PLATELET
Abs Immature Granulocytes: 0.01 10*3/uL (ref 0.00–0.07)
Basophils Absolute: 0 10*3/uL (ref 0.0–0.1)
Basophils Relative: 0 %
Eosinophils Absolute: 0 10*3/uL (ref 0.0–0.5)
Eosinophils Relative: 0 %
HCT: 32.5 % — ABNORMAL LOW (ref 36.0–46.0)
Hemoglobin: 9.8 g/dL — ABNORMAL LOW (ref 12.0–15.0)
Immature Granulocytes: 0 %
Lymphocytes Relative: 5 %
Lymphs Abs: 0.2 10*3/uL — ABNORMAL LOW (ref 0.7–4.0)
MCH: 32.6 pg (ref 26.0–34.0)
MCHC: 30.2 g/dL (ref 30.0–36.0)
MCV: 108 fL — ABNORMAL HIGH (ref 80.0–100.0)
Monocytes Absolute: 0.1 10*3/uL (ref 0.1–1.0)
Monocytes Relative: 1 %
Neutro Abs: 4.3 10*3/uL (ref 1.7–7.7)
Neutrophils Relative %: 94 %
Platelets: 132 10*3/uL — ABNORMAL LOW (ref 150–400)
RBC: 3.01 MIL/uL — ABNORMAL LOW (ref 3.87–5.11)
RDW: 14.3 % (ref 11.5–15.5)
WBC: 4.6 10*3/uL (ref 4.0–10.5)
nRBC: 0 % (ref 0.0–0.2)

## 2022-02-21 LAB — STREP PNEUMONIAE URINARY ANTIGEN: Strep Pneumo Urinary Antigen: NEGATIVE

## 2022-02-21 LAB — MAGNESIUM: Magnesium: 1.7 mg/dL (ref 1.7–2.4)

## 2022-02-21 MED ORDER — OXYCODONE-ACETAMINOPHEN 5-325 MG PO TABS
1.0000 | ORAL_TABLET | Freq: Four times a day (QID) | ORAL | Status: AC | PRN
  Administered 2022-02-21 – 2022-02-23 (×4): 1 via ORAL
  Filled 2022-02-21 (×4): qty 1

## 2022-02-21 MED ORDER — HALOPERIDOL LACTATE 5 MG/ML IJ SOLN
2.0000 mg | Freq: Four times a day (QID) | INTRAMUSCULAR | Status: DC | PRN
  Administered 2022-02-21 – 2022-02-22 (×2): 2 mg via INTRAVENOUS
  Filled 2022-02-21 (×2): qty 1

## 2022-02-21 MED ORDER — STERILE WATER FOR INJECTION IJ SOLN
INTRAMUSCULAR | Status: AC
  Filled 2022-02-21: qty 10

## 2022-02-21 MED ORDER — HYDRALAZINE HCL 20 MG/ML IJ SOLN
10.0000 mg | Freq: Four times a day (QID) | INTRAMUSCULAR | Status: DC | PRN
  Administered 2022-02-21 – 2022-02-23 (×4): 10 mg via INTRAVENOUS
  Filled 2022-02-21 (×5): qty 1

## 2022-02-21 MED ORDER — CARVEDILOL 3.125 MG PO TABS
3.1250 mg | ORAL_TABLET | Freq: Two times a day (BID) | ORAL | Status: DC
  Administered 2022-02-21 – 2022-02-26 (×11): 3.125 mg via ORAL
  Filled 2022-02-21 (×11): qty 1

## 2022-02-21 MED ORDER — ALPRAZOLAM 0.5 MG PO TABS
0.5000 mg | ORAL_TABLET | Freq: Three times a day (TID) | ORAL | Status: DC | PRN
Start: 2022-02-21 — End: 2022-02-27
  Administered 2022-02-21 – 2022-02-26 (×10): 0.5 mg via ORAL
  Filled 2022-02-21 (×10): qty 1

## 2022-02-21 NOTE — Progress Notes (Addendum)
PROGRESS NOTE  [name]  DOB: 02-Aug-1930  PCP: Tonia Ghent, MD JJK:[id number]  DOA: 02/20/2022  LOS: 1 day  Hospital Day: 2  Brief narrative: [name] is a 88 y.o. female with PMH significant for COPD on 3 LPM O2, mild chronic systolic CHF (Echo 09/7167 EF 40-45%), CAD (STEMI 01/2019 with DES to RCA), right breast cancer (ER positive invasive ductal carcinoma on Anastrozole), gastroesophageal reflux disease, hyperlipidemia, anemia of chronic disease, hypothyroidism and mild dementia Patient was brought to the ED on 8/15 with worsening confusion. 8/13, patient fell at home while attempting to get out of bed.  She was found by daughter with her walker on top of her.  She complained of right-sided chest pain and was brought to the ED.  CT chest showed mildly displaced fracture of the posterior right ninth and 10th ribs, compression fracture of the T6 as well as anterior wedge compression fracture of T7.  Patient was treated for pain with fentanyl, Motrin, Tylenol.  She did not meet criteria for admission and hence was discharged to home. At home, she has been experiencing progressively worsening pain, agitation and confusion.  On 8/15, seen by PCP and sent to ED again. In the ED, patient was agitated and intermittently following commands.  Afebrile, hemodynamically stable. VBG showed compensated respiratory acidosis with significantly elevated PCO2 at 115 and bicarbonate at 59.  Baseline CO2 around 70. BiPAP was initiated Admitted to hospitalist service for further evaluation management.  Subjective: Patient was seen and examined this morning.  Pleasant elderly Caucasian female.  Propped up in bed.  Alert, awake, confused, very talkative.  Not agitated or restless at this time.  Daughter at bedside. Chart reviewed Remains afebrile and hemodynamically stable.  On 3 L oxygen this morning. Labs this morning with serum bicarbonate down to 37, hemoglobin low at  9.8  Assessment and plan: Acute on chronic respiratory failure with hypoxia and hypercapnia  Presented with progressively worsening confusion, agitation after 2 days of rib fractures.  Patient probably had limitation in lung expansion that worsened her respiratory failure precipitating significantly elevated CO2.  Required BiPAP.  On 2 L oxygen this morning.  Continue BiPAP as needed and nightly for now.   DNR/DNI per family  Aspiration pneumonia Acute exacerbation of COPD CT chest on 8/13 showed developing right lower lobe infiltrate concerning for aspiration pneumonia. Currently on IV Rocephin, azithromycin (QTc 437 ms) and IV Solu-Medrol 60 mg twice daily.  Continue the same for today Continue bronchodilators, incentive spirometry Encourage ambulation Patient has a history of dysphagia in the past and was evaluated by speech therapy during her June hospitalization.  SLP eval ordered.    Right ninth and 10th rib fractures After a fall on 8/13. Conservative management with adequate pain control. Once seated level and mental status improves, will consider opiates Currently on Tylenol and Lidoderm patch   Acute metabolic encephalopathy Underlying dementia Acute agitation secondary to hypercapnia.  PTA on Xanax as needed, Aricept at bedtime, Cymbalta 60 mg daily, BuSpar 5 mg twice daily trazodone 100 mg at bedtime Currently continued on these.  Continue to monitor mental status  Mildly elevated troponin History of CAD HLD Chest pain likely due to rib fractures.  Only mildly elevated troponin.   Continue Crestor.  Does not seem to be on any endocrine/anticoagulant. Recent Labs    02/19/22 0050 02/20/22 1600 02/20/22 1929  CKTOTAL 31*  --   --   TROPONINIHS  --  22* 21*  Essential hypertension Blood pressure trending up.  180s this afternoon I did not see any BP meds on her home list.  Started on Coreg 3.125 mg twice daily and hydralazine as needed.  History of right breast  cancer Continue anastrozole   Acquired hypothyroidism Continue Synthroid    GERD  Continue PPI    Goals of care, counseling/discussion Confirmed DNR/DNI status on admission with daughter Prognosis guarded   Goals of care   Code Status: DNR    Mobility: PT eval ordered  Skin assessment:     Nutritional status:  There is no height or weight on file to calculate BMI.          Diet:  Diet Order             DIET DYS 3 Room service appropriate? Yes; Fluid consistency: Thin  Diet effective now                   DVT prophylaxis:  enoxaparin (LOVENOX) injection 40 mg Start: 02/20/22 2230   Antimicrobials: IV Rocephin, IV azithromycin Fluid: None Consultants: None Family Communication: Daughter at bedside  Status is: Inpatient  Continue in-hospital care because: Improving respiratory status, need 1-2 more days of monitoring Level of care: Stepdown   Dispo: The patient is from: Home              Anticipated d/c is to: Pending clinical course, pending PT eval              Patient currently is not medically stable to d/c.   Difficult to place patient No     Infusions:   azithromycin Stopped (02/21/22 0039)   cefTRIAXone (ROCEPHIN)  IV Stopped (02/20/22 2316)    Scheduled Meds:  acetaminophen  1,000 mg Oral Once   acetaminophen  650 mg Oral BID   anastrozole  1 mg Oral Daily   aspirin EC  81 mg Oral Daily   busPIRone  5 mg Oral BID   carvedilol  3.125 mg Oral BID WC   Chlorhexidine Gluconate Cloth  6 each Topical Q0600   donepezil  5 mg Oral QHS   DULoxetine  60 mg Oral Daily   enoxaparin (LOVENOX) injection  40 mg Subcutaneous Q24H   ipratropium-albuterol  3 mL Nebulization TID   levothyroxine  112 mcg Oral Q0600   lidocaine  1 patch Transdermal Q24H   methylPREDNISolone (SOLU-MEDROL) injection  60 mg Intravenous Q12H   mouth rinse  15 mL Mouth Rinse 4 times per day   pantoprazole (PROTONIX) IV  40 mg Intravenous Q24H   rosuvastatin  10 mg Oral  Daily   traZODone  100 mg Oral QHS    PRN meds: acetaminophen **OR** acetaminophen, ALPRAZolam, fluticasone, hydrALAZINE, ipratropium-albuterol, nitroGLYCERIN, ondansetron **OR** ondansetron (ZOFRAN) IV, mouth rinse, oxyCODONE-acetaminophen, polyethylene glycol   Antimicrobials: Anti-infectives (From admission, onward)    Start     Dose/Rate Route Frequency Ordered Stop   02/20/22 2230  cefTRIAXone (ROCEPHIN) 2 g in sodium chloride 0.9 % 100 mL IVPB        2 g 200 mL/hr over 30 Minutes Intravenous Every 24 hours 02/20/22 2131     02/20/22 2230  azithromycin (ZITHROMAX) 500 mg in sodium chloride 0.9 % 250 mL IVPB        500 mg 250 mL/hr over 60 Minutes Intravenous Every 24 hours 02/20/22 2131         Objective: Vitals:   02/21/22 1300 02/21/22 1326  BP:  (!) 186/88  Pulse:  86 80  Resp: 14 17  Temp: 97.6 F (36.4 C)   SpO2: 98% 96%    Intake/Output Summary (Last 24 hours) at 02/21/2022 1327 Last data filed at 02/21/2022 1030 Gross per 24 hour  Intake 893.97 ml  Output 300 ml  Net 593.97 ml   There were no vitals filed for this visit. Weight change:  There is no height or weight on file to calculate BMI.   Physical Exam: General exam: Pleasant, elderly Caucasian female.  Not in physical distress Skin: No rashes, lesions or ulcers. HEENT: Atraumatic, normocephalic, no obvious bleeding Lungs: Diminished air entry in bases more on right than left.  Otherwise clear to auscultation CVS: Regular rate and rhythm, no murmur GI/Abd soft, nontender, nondistended, bowel sound present CNS: Alert, awake, oriented place and person.  Demented at baseline.  Very talkative this morning.  Not restless or agitated Psychiatry: Mood appropriate Extremities: No pedal edema, no calf tenderness  Data Review: I have personally reviewed the laboratory data and studies available.  F/u labs ordered Unresulted Labs (From admission, onward)     Start     Ordered   02/22/22 0500  CBC with  Differential/Platelet  Tomorrow morning,   R       Question:  Specimen collection method  Answer:  Lab=Lab collect   02/21/22 1251   02/22/22 7915  Basic metabolic panel  Tomorrow morning,   R       Question:  Specimen collection method  Answer:  Lab=Lab collect   02/21/22 1251   02/20/22 2142  Legionella Pneumophila Serogp 1 Ur Ag  (COPD / Pneumonia / Cellulitis / Lower Extremity Wound)  Once,   R        02/20/22 2142   02/20/22 2141  Culture, blood (routine x 2) Call MD if unable to obtain prior to antibiotics being given  (COPD / Pneumonia / Cellulitis / Lower Extremity Wound)  BLOOD CULTURE X 2,   R     Comments: If blood cultures drawn in Emergency Department - Do not draw and cancel order    02/20/22 2142            Signed, Terrilee Croak, MD [facility] 02/21/2022

## 2022-02-21 NOTE — Patient Instructions (Signed)
Visit Information  Thank you for taking time to visit with me today. Please don't hesitate to contact me if I can be of assistance to you.   Following are the goals we discussed today:   Goals Addressed               This Visit's Progress     Find Help in My Community (pt-stated)        Timeframe:  Short-Term Goal Priority:  High Start Date:       01/12/22                     Expected End Date:                 03/07/22    Follow Up Date 02/23/22  -communicated with both daughters- re: current hospitalization and likely need for SNF rehab at dc.  -review [employer] benefits- call to seek info as needed -review [employer] program for eligibility - begin a notebook of services in my neighborhood or community - call 211 when I need some help - follow-up on any referrals for help I am given - think ahead to make sure my need does not become an emergency - make a note about what I need to have by the phone or take with me, like an identification card or social security number have a back-up plan - have a back-up plan - make a list of family or friends that I can call    Why is this important?   Knowing how and where to find help for yourself or family in your neighborhood and community is an important skill.  You will want to take some steps to learn how.    Notes:         Our next appointment is by telephone on 02/23/22    Please call the care guide team at [phone] if you need to cancel or reschedule your appointment.   If you are experiencing a Mental Health or [place] or need someone to talk to, please call 911   Patient verbalizes understanding of instructions and care plan provided today and agrees to view in [place]. Active MyChart status and patient understanding of how to access instructions and care plan via MyChart confirmed with patient.     Telephone follow up appointment with care management team member scheduled  for:02/23/22  Rawleigh Rode MSW, LCSW Licensed Clinical Social Worker      [phone]

## 2022-02-21 NOTE — Progress Notes (Signed)
Patient has been becoming progressively more anxious and agitated throughout the day. Pt attempting to climb OOB, and reports dizziness and feels "worked up". Patient received PRN xanax with no relief. Patient's BP has also been elevated, and has received PRN hydralazine and scheduled Coreg. Dr Pietro Cassis paged by this RN in regards to agitation and dizziness; PRN haldol given. This RN will continue to carefully monitor pt for changes in condition. VS stable at this time.

## 2022-02-21 NOTE — Patient Outreach (Signed)
  Care Coordination   Follow Up Visit Note   02/21/2022 Name: Aarna Mihalko MRN: [id number] DOB: 1931/03/11  Jadesola Poynter is a 88 y.o. year old female who sees Tonia Ghent, MD for primary care. I spoke with  daughters- [name] [facility]) and [name] by phone today- pt admitted to hospital.  What matters to the patients health and wellness today?  Supportive care (hospital/SNF at dc?)     Goals Addressed               This Visit's Progress     Find Help in My Community (pt-stated)        Timeframe:  Short-Term Goal Priority:  High Start Date:       01/12/22                     Expected End Date:                 03/07/22    Follow Up Date 02/23/22  -communicated with both daughters- re: current hospitalization and likely need for SNF rehab at dc.  -review [employer] benefits- call to seek info as needed -review [employer] program for eligibility - begin a notebook of services in my neighborhood or community - call 211 when I need some help - follow-up on any referrals for help I am given - think ahead to make sure my need does not become an emergency - make a note about what I need to have by the phone or take with me, like an identification card or social security number have a back-up plan - have a back-up plan - make a list of family or friends that I can call    Why is this important?   Knowing how and where to find help for yourself or family in your neighborhood and community is an important skill.  You will want to take some steps to learn how.    Notes:         SDOH assessments and interventions completed:  Yes     Care Coordination Interventions Activated:  Yes  Care Coordination Interventions:  Yes, provided   Follow up plan: Follow up call scheduled for 02/23/22    Encounter Outcome:  Pt. Visit Completed   Eduard Clos MSW, LCSW Licensed Clinical Social Worker      [phone]

## 2022-02-21 NOTE — TOC Initial Note (Signed)
Transition of Care [facility]) - Initial/Assessment Note    Patient Details  Name: [name] MRN: [id number] Date of Birth: 07-08-1931  Transition of Care [facility] CM/SW Contact:    Dessa Phi, RN Phone Number: 02/21/2022, 11:10 AM  Clinical Narrative: Await call back from dtr Tina(POA listed)[phone]. Has private duty care givers(20hrs a/ wk),family support but lives home alone. On HFNC-will monitor. PT cons await recc. THN placed note-may need ST SNF.                 Expected Discharge Plan: Skilled Nursing Facility Barriers to Discharge: Continued Medical Work up   Patient Goals and CMS Choice Patient states their goals for this hospitalization and ongoing recovery are:: Rehab CMS Medicare.gov Compare Post Acute Care list provided to:: Patient Represenative (must comment) Choice offered to / list presented to : Adult Children  Expected Discharge Plan and Services Expected Discharge Plan: [place]   Discharge Planning Services: CM Consult Post Acute Care Choice: [place] Living arrangements for the past 2 months: Single Family Home                                      Prior Living Arrangements/Services Living arrangements for the past 2 months: Single Family Home Lives with:: Self Patient language and need for interpreter reviewed:: Yes Do you feel safe going back to the place where you live?: Yes      Need for Family Participation in Patient Care: Yes (Comment) Care giver support system in place?: Yes (comment) Current home services: Other (comment) (Private care giver) Criminal Activity/Legal Involvement Pertinent to Current Situation/Hospitalization: No - Comment as needed  Activities of Daily Living Home Assistive Devices/Equipment: Bedside commode/3-in-1, Walker (specify type), Hearing aid (left hearing aide - equipment from previous hx, no family at bedside at time of admission hx.) ADL Screening (condition at time  of admission) Patient's cognitive ability adequate to safely complete daily activities?: No Is the patient deaf or have difficulty hearing?: Yes (wears left hearing aide per previous hx) Does the patient have difficulty seeing, even when wearing glasses/contacts?: Yes (hx macular degeneration per previous hx) Does the patient have difficulty concentrating, remembering, or making decisions?: Yes Patient able to express need for assistance with ADLs?: No (on bipap currently) Does the patient have difficulty dressing or bathing?: Yes Independently performs ADLs?: No Communication: Independent Dressing (OT): Needs assistance Is this a change from baseline?: Pre-admission baseline Grooming: Needs assistance Is this a change from baseline?: Pre-admission baseline Feeding: Needs assistance Is this a change from baseline?: Pre-admission baseline Bathing: Needs assistance Is this a change from baseline?: Pre-admission baseline Toileting: Needs assistance Is this a change from baseline?: Pre-admission baseline In/Out Bed: Needs assistance Is this a change from baseline?: Pre-admission baseline Walks in Home: Needs assistance Is this a change from baseline?: Pre-admission baseline Does the patient have difficulty walking or climbing stairs?: Yes Weakness of Legs: Both Weakness of Arms/Hands: Both  Permission Sought/Granted Permission sought to share information with : Case Manager Permission granted to share information with : Yes, Verbal Permission Granted  Share Information with NAME: Case manager           Emotional Assessment Appearance:: Appears stated age Attitude/Demeanor/Rapport: Gracious Affect (typically observed): Accepting Orientation: : Oriented to Self, Oriented to Place, Oriented to  Time, Oriented to Situation Alcohol / Substance Use: Not Applicable Psych Involvement: No (comment)  Admission diagnosis:  Acute respiratory failure with hypercapnia (HCC) [J96.02] Patient  Active Problem List   Diagnosis Date Noted   Acute on chronic respiratory failure with hypoxia and hypercapnia (HCC) 02/20/2022   Aspiration pneumonia of right lower lobe (HCC) 02/20/2022   Elevated troponin level not due myocardial infarction 02/20/2022   Dementia with behavioral disturbance ([place]) 02/20/2022   Multiple fractures of ribs, right side, subsequent encounter for fracture with routine healing 02/20/2022   COPD with acute exacerbation ([place]) 47/65/4650   Acute metabolic encephalopathy 35/46/5681   Ground-level fall 12/06/2021   Breast cancer ([place])    Chronic systolic CHF (congestive heart failure) ([place])    Syncope 09/25/2021   Acute on chronic respiratory failure with hypoxia (HCC) 07/07/2020   Generalized weakness 07/07/2020   Decreased appetite 09/20/2019   Lower GI bleeding 09/09/2019   DNR (do not resuscitate) 09/08/2019   Chronic anemia 09/07/2019   Macrocytic anemia 09/07/2019   Jerking 09/07/2019   Fall at home, initial encounter 09/07/2019   History of CVA (cerebrovascular accident) 09/07/2019   History of right breast cancer 09/07/2019   Pelvic pain 03/25/2019   Dysphagia 02/18/2019   Coronary artery disease involving native coronary artery of native heart without angina pectoris 01/30/2019   Presence of drug coated stent in right coronary artery 01/30/2019   Left main coronary artery disease - ~50-60% dLM (&70% dLAD) -- Plan Med Rx 01/30/2019   Ischemic cardiomyopathy 01/30/2019   AAA (abdominal aortic aneurysm) ([place]) 12/31/2018   Malignant neoplasm of upper-outer quadrant of right breast in female, estrogen receptor positive ([place]) 09/26/2018   Breast mass 08/28/2018   Bilateral impacted cerumen 08/14/2017   Laryngopharyngeal reflux (LPR) 08/14/2017   Presbycusis of both ears 08/14/2017   Thoracic aortic aneurysm 04/10/2017   Abdominal pain 04/05/2017   Low back pain 01/01/2017   Medicare annual wellness visit, subsequent 09/26/2016   Hard of hearing  09/26/2016   Vitamin D deficiency 09/26/2016   Goals of care, counseling/discussion 09/26/2016   Encounter for chronic pain management 07/26/2016   Edema 10/21/2014   Multinodular thyroid 07/07/2013   COPD (chronic obstructive pulmonary disease) ([place]) 06/09/2013   HTN (hypertension) 06/09/2013   Chronic respiratory failure with hypoxia ([place]) 05/13/2013   Cough 05/02/2013   Vertigo 12/31/2012   Acute blood loss anemia 12/13/2011   Osteoarthritis of right knee 08/27/2011   Gold C Copd with asthmatic bronchitis component and chronic resp failure 06/26/2011   Neck pain 04/02/2011   RISK OF FALLING 08/17/2010   Hypokalemia 05/24/2009   Anemia, unspecified 04/10/2008   Anxiety 03/01/2008   Osteoporosis 06/23/2007   Chronic pain syndrome 12/26/2006   Acquired hypothyroidism 12/24/2006   Mixed hyperlipidemia 12/24/2006   COMMON MIGRAINE 12/24/2006   GERD without esophagitis 12/24/2006   DEGENERATIVE [place] DISEASE 12/24/2006   Fibromyalgia 12/24/2006   Insomnia 12/24/2006   HYPERGLYCEMIA 12/24/2006   PCP:  Tonia Ghent, MD Pharmacy:   [place], [place] - [address] [address] Phone: [phone] Fax: [place], [place] [address] Phone: [phone] Fax: [phone]     Social Determinants of Health (SDOH) Interventions    Readmission Risk Interventions    12/13/2021    1:29 PM  Readmission Risk Prevention Plan  PCP or Specialist appointment within 3-5 days of discharge Complete  HRI or Home Care Consult Complete  Palliative Care Screening Not Applicable  Skilled Nursing Facility Complete

## 2022-02-21 NOTE — Progress Notes (Signed)
SLP order received.  Went to see pt for evaluation after chart review.   Pt admits to some issues with dysphagia, sensing food sticking in her esophagus- reports she has a hiatal hernia and she is not going to "mess with that stuff" saying she is "tired".  Reviewed with pt note from Dr Constance Holster in 2019 due to visit for reflux/throat clearing as well as prior MBS 12/2021. Dr Constance Holster advised pt to strictly avoid chocolate, tobacco products, peppermint, ETOH, etc.  SLP from 12/2021 recommended pt consume small frequent meals due to suspected primary issues with esophageal clearance.  SLP reviewed above education with patient, after which SLP attempted clinical swallow study - Pt politely declined several times stating she's "tired' and just wants to "rest".  SLP will sign off per pt wishes, please reconsult if pt agreeable to evaluation.     Kathleen Lime, MS [facility] SLP Acute Rehab Services Office [phone] Pager [phone]

## 2022-02-21 NOTE — Assessment & Plan Note (Signed)
Confusion, functional decline.  Patient has been more confused per family report, can't get out of bed without help.  She appears confused and didn't recognize me on the call.  She is in bed with O2 via [place].  She is talking but not able to participate in conversation .   It doesn't appear that she can make decisions at this point.  Talked with [name] and we agreed that she wasn't safe to stay at home.  We agreed that she needed ER eval and likely inpatient tx for symptom management.  If she improves she likely needs placement.  Without improvement, she may need hospice eval.    I called 911, gave address and details.  Operator advised me to have [place] call 911 from location.  I called and LMOV[facility] for [facility].    I called [name] and she agreed with plan re: EMS.    I called back and talked to [facility].  Per her report, EMS is already on site so I think it makes sense to defer calling 911 back.

## 2022-02-21 NOTE — Progress Notes (Signed)
Bipap on standby.  Patient on 3L nasal cannula at this time.  Tolerating well. O2 sat 100%.

## 2022-02-22 DIAGNOSIS — J9622 Acute and chronic respiratory failure with hypercapnia: Secondary | ICD-10-CM | POA: Diagnosis not present

## 2022-02-22 DIAGNOSIS — J9621 Acute and chronic respiratory failure with hypoxia: Secondary | ICD-10-CM | POA: Diagnosis not present

## 2022-02-22 DIAGNOSIS — E039 Hypothyroidism, unspecified: Secondary | ICD-10-CM | POA: Diagnosis not present

## 2022-02-22 DIAGNOSIS — G9341 Metabolic encephalopathy: Secondary | ICD-10-CM | POA: Diagnosis not present

## 2022-02-22 LAB — BLOOD GAS, ARTERIAL
Acid-Base Excess: 23.8 mmol/L — ABNORMAL HIGH (ref 0.0–2.0)
Bicarbonate: 49.6 mmol/L — ABNORMAL HIGH (ref 20.0–28.0)
O2 Content: 32 L/min
O2 Saturation: 99.7 %
Patient temperature: 37
pCO2 arterial: 58 mmHg — ABNORMAL HIGH (ref 32–48)
pH, Arterial: 7.54 — ABNORMAL HIGH (ref 7.35–7.45)
pO2, Arterial: 86 mmHg (ref 83–108)

## 2022-02-22 LAB — CBC WITH DIFFERENTIAL/PLATELET
Abs Immature Granulocytes: 0.01 10*3/uL (ref 0.00–0.07)
Basophils Absolute: 0 10*3/uL (ref 0.0–0.1)
Basophils Relative: 0 %
Eosinophils Absolute: 0 10*3/uL (ref 0.0–0.5)
Eosinophils Relative: 0 %
HCT: 29.6 % — ABNORMAL LOW (ref 36.0–46.0)
Hemoglobin: 9.3 g/dL — ABNORMAL LOW (ref 12.0–15.0)
Immature Granulocytes: 0 %
Lymphocytes Relative: 8 %
Lymphs Abs: 0.3 10*3/uL — ABNORMAL LOW (ref 0.7–4.0)
MCH: 32.3 pg (ref 26.0–34.0)
MCHC: 31.4 g/dL (ref 30.0–36.0)
MCV: 102.8 fL — ABNORMAL HIGH (ref 80.0–100.0)
Monocytes Absolute: 0.1 10*3/uL (ref 0.1–1.0)
Monocytes Relative: 2 %
Neutro Abs: 3.7 10*3/uL (ref 1.7–7.7)
Neutrophils Relative %: 90 %
Platelets: 128 10*3/uL — ABNORMAL LOW (ref 150–400)
RBC: 2.88 MIL/uL — ABNORMAL LOW (ref 3.87–5.11)
RDW: 14.4 % (ref 11.5–15.5)
WBC: 4.1 10*3/uL (ref 4.0–10.5)
nRBC: 0 % (ref 0.0–0.2)

## 2022-02-22 LAB — BASIC METABOLIC PANEL
Anion gap: 9 (ref 5–15)
BUN: 15 mg/dL (ref 8–23)
CO2: 37 mmol/L — ABNORMAL HIGH (ref 22–32)
Calcium: 8.4 mg/dL — ABNORMAL LOW (ref 8.9–10.3)
Chloride: 95 mmol/L — ABNORMAL LOW (ref 98–111)
Creatinine, Ser: 0.43 mg/dL — ABNORMAL LOW (ref 0.44–1.00)
GFR, Estimated: >60 mL/min (ref >60)
Glucose, Bld: 131 mg/dL — ABNORMAL HIGH (ref 70–99)
Potassium: 3.7 mmol/L (ref 3.5–5.1)
Sodium: 141 mmol/L (ref 135–145)

## 2022-02-22 LAB — LEGIONELLA PNEUMOPHILA SEROGP 1 UR AG: L. pneumophila Serogp 1 Ur Ag: NEGATIVE

## 2022-02-22 MED ORDER — STERILE WATER FOR INJECTION IJ SOLN
INTRAMUSCULAR | Status: AC
  Administered 2022-02-22: 10 mL
  Filled 2022-02-22: qty 10

## 2022-02-22 NOTE — Evaluation (Signed)
Physical Therapy Evaluation Patient Details Name: [name] MRN: [id number] DOB: 03-02-1931 Today's Date: 02/22/2022  History of Present Illness  [name] is a 88 y.o. female with PMH significant for COPD on 3 LPM O2, mild chronic systolic CHF -EF 86-57%), CAD STEM, right breast cancer, gastroesophageal reflux disease, hyperlipidemia, anemia of chronic disease, hypothyroidism and mild dementia  Patient was brought to the ED on 8/15 with worsening confusion, fall on 8/13, brought to the ED.  CT chest showed mildly displaced fracture of the posterior right ninth and 10th ribs, compression fracture of the T6 as well as anterior wedge compression fracture of T7.  Patient was treated and returned home. Progressively worsening pain, agitation and confusion, sent to ED by PCP on 8/15. On   Home O2.  Clinical Impression  The patient is pleasantly confused, daughter present  to provide information. Patient  resides at home with assistance of   caregiver and daughter, increased need for  care and assistance.  Patient assisted to and from [facility] with mod assistance. SPO2 on 3 L 100%, HR 104 max, RR 30  for transfers.  Patient will benefit from [facility] for rehab.  Pt admitted with above diagnosis.  Pt currently with functional limitations due to the deficits listed below (see PT Problem List). Pt will benefit from skilled PT to increase their independence and safety with mobility to allow discharge to the venue listed below.        Recommendations for follow up therapy are one component of a multi-disciplinary discharge planning process, led by the attending physician.  Recommendations may be updated based on patient status, additional functional criteria and insurance authorization.  Follow Up Recommendations Skilled nursing-short term rehab (<3 hours/day) Can patient physically be transported by private vehicle: No    Assistance Recommended at Discharge Frequent or constant  Supervision/Assistance  Patient can return home with the following  A lot of help with walking and/or transfers;A little help with bathing/dressing/bathroom;Help with stairs or ramp for entrance;Assistance with cooking/housework;Assist for transportation    Equipment Recommendations None recommended by PT  Recommendations for Other Services       Functional Status Assessment Patient has had a recent decline in their functional status and demonstrates the ability to make significant improvements in function in a reasonable and predictable amount of time.     Precautions / Restrictions Precautions Precautions: Fall Precaution Comments: monitor sats      Mobility  Bed Mobility Overal bed mobility: Needs Assistance Bed Mobility: Supine to Sit, Sit to Supine     Supine to sit: Min assist, HOB elevated Sit to supine: Min assist, HOB elevated        Transfers Overall transfer level: Needs assistance Equipment used: Rolling walker (2 wheels), 1 person hand held assist Transfers: Sit to/from Stand, Bed to chair/wheelchair/BSC Sit to Stand: Mod assist   Step pivot transfers: Mod assist       General transfer comment: assist to rise from bed and BSC, step to [facility] then  to bed.    Ambulation/Gait                  Stairs            Wheelchair Mobility    Modified Rankin (Stroke Patients Only)       Balance Overall balance assessment: Needs assistance Sitting-balance support: Bilateral upper extremity supported, Feet supported Sitting balance-Leahy Scale: Fair     Standing balance support: During functional activity, Bilateral upper extremity supported  Standing balance-Leahy Scale: Poor Standing balance comment: reliant on support                             Pertinent Vitals/Pain Pain Assessment Pain Assessment: Faces Faces Pain Scale: Hurts even more Pain Location: right side to roll to side Pain Descriptors / Indicators: Aching,  Discomfort Pain Intervention(s): Limited activity within patient's tolerance, Monitored during session, Repositioned    Home Living Family/patient expects to be discharged to:: Private residence Living Arrangements: Children Available Help at Discharge: Family;Available PRN/intermittently;Personal care attendant Type of Home: House Home Access: Ramped entrance       Home Layout: One level Home Equipment: Rollator (4 wheels);BSC/3in1;Shower seat;Wheelchair - manual;Grab bars - toilet;Grab bars - tub/shower Additional Comments: daughter stays nights after home caregiver leaves    Prior Function Prior Level of Function : Needs assist  Cognitive Assist : ADLs (cognitive) Mobility (Cognitive): Intermittent cues   Physical Assist : Mobility (physical) Mobility (physical): Bed mobility;Transfers;Gait   Mobility Comments: walks with rollator, requires assistanc efor safety ADLs Comments: doesn't drive, family does the cooking and cleaning, washes up at sink per family     Hand Dominance   Dominant Hand: Right    Extremity/Trunk Assessment   Upper Extremity Assessment Upper Extremity Assessment: Generalized weakness    Lower Extremity Assessment Lower Extremity Assessment: Generalized weakness    Cervical / Trunk Assessment Cervical / Trunk Assessment: Kyphotic  Communication   Communication: HOH  Cognition Arousal/Alertness: Awake/alert Behavior During Therapy: WFL for tasks assessed/performed Overall Cognitive Status: History of cognitive impairments - at baseline                                 General Comments: follows directions 1 step with exra time, [facility]        General Comments      Exercises     Assessment/Plan    PT Assessment Patient needs continued PT services  PT Problem List Decreased strength;Decreased mobility;Decreased safety awareness;Decreased activity tolerance;Decreased balance;Decreased cognition;Decreased knowledge of use of  DME;Decreased knowledge of precautions       PT Treatment Interventions DME instruction;Therapeutic activities;Cognitive remediation;Gait training;Therapeutic exercise;Patient/family education;Functional mobility training;Balance training    PT Goals (Current goals can be found in the Care Plan section)  Acute Rehab PT Goals Patient Stated Goal: to go to ALF for more assistance per dtr PT Goal Formulation: With patient/family Time For Goal Achievement: 03/08/22 Potential to Achieve Goals: Fair    Frequency Min 2X/week     Co-evaluation               AM-PAC PT "6 Clicks" Mobility  Outcome Measure Help needed turning from your back to your side while in a flat bed without using bedrails?: A Little Help needed moving from lying on your back to sitting on the side of a flat bed without using bedrails?: A Little Help needed moving to and from a bed to a chair (including a wheelchair)?: A Lot Help needed standing up from a chair using your arms (e.g., wheelchair or bedside chair)?: A Lot Help needed to walk in hospital room?: Total Help needed climbing 3-5 steps with a railing? : Total 6 Click Score: 12    End of Session   Activity Tolerance: Patient limited by fatigue Patient left: in bed;with call bell/phone within reach;with bed alarm set;with family/visitor present Nurse Communication: Mobility status PT Visit  Diagnosis: Unsteadiness on feet (R26.81);History of falling (Z91.81);Repeated falls (R29.6);Difficulty in walking, not elsewhere classified (R26.2)    Time: 5750-5183 PT Time Calculation (min) (ACUTE ONLY): 33 min   Charges:   PT Evaluation $PT Eval Low Complexity: 1 Low PT Treatments $Therapeutic Activity: 8-22 mins        Tresa Endo PT Acute Rehabilitation Services Office [phone] Weekend [phone]   Claretha Cooper 02/22/2022, 2:41 PM

## 2022-02-22 NOTE — Progress Notes (Signed)
Report called to Madelon Lips RN. All questions answered at this time. All pt belongings and paper chart transported with pt. Pt was taken upstairs in the bed on tele by RN and NT. Handoff completed.

## 2022-02-22 NOTE — Progress Notes (Signed)
PROGRESS NOTE  [name]  DOB: 03-Jul-1931  PCP: Tonia Ghent, MD TKW:[id number]  DOA: 02/20/2022  LOS: 2 days  Hospital Day: 3  Brief narrative:  [name] is a 88 y.o. female with PMH significant for COPD on 3 LPM O2, mild chronic systolic CHF (Echo 03/2425 EF 40-45%), CAD (STEMI 01/2019 with DES to RCA), right breast cancer (ER positive invasive ductal carcinoma on Anastrozole), gastroesophageal reflux disease, hyperlipidemia, anemia of chronic disease, hypothyroidism and mild dementia Patient was brought to the ED on 8/15 with worsening confusion. 8/13, patient fell at home while attempting to get out of bed.  She was found by daughter with her walker on top of her.  She complained of right-sided chest pain and was brought to the ED.  CT chest showed mildly displaced fracture of the posterior right ninth and 10th ribs, compression fracture of the T6 as well as anterior wedge compression fracture of T7.  Patient was treated for pain with fentanyl, Motrin, Tylenol.  She did not meet criteria for admission and hence was discharged to home. At home, she has been experiencing progressively worsening pain, agitation and confusion.  On 8/15, seen by PCP and sent to ED again. In the ED, patient was agitated and intermittently following commands.  Afebrile, hemodynamically stable. VBG showed compensated respiratory acidosis with significantly elevated PCO2 at 115 and bicarbonate at 59.  Baseline CO2 around 70. BiPAP was initiated Admitted to hospitalist service for further evaluation management.  Subjective:  Patient this morning denies any complaints, daughter at bedside, per staff she had some confusion overnight.  Assessment and plan:  Acute on chronic respiratory failure with hypoxia and hypercapnia  - Presented with progressively worsening confusion, agitation after 2 days of rib fractures.  Patient probably had limitation in lung expansion that worsened her  respiratory failure precipitating significantly elevated CO2.  Required BiPAP.  On 2 L oxygen this morning.  Continue BiPAP as needed and nightly for now.   - DNR/DNI per family -He is with no BiPAP requirement over last 24 hours, will obtain ABG this morning to see if PCO2 is improving, and if it is we will transfer to telemetry floor.  Aspiration pneumonia Acute exacerbation of COPD - CT chest on 8/13 showed developing right lower lobe infiltrate concerning for aspiration pneumonia. Currently on IV Rocephin, azithromycin (QTc 437 ms) and IV Solu-Medrol 60 mg twice daily.  .Continue with IV antibiotics c -She was encouraged to use incentive spirometry . -Continue with bronchodilators. -PT consulted, encouraged to ambulate. -SLP consulted given history of dysphagia.    Right ninth and 10th rib fractures - After a fall on 8/13. - Conservative management with adequate pain control. - Currently on Tylenol and Lidoderm patch   Acute metabolic encephalopathy Underlying dementia - Acute agitation secondary to hypercapnia.  - PTA on Xanax as needed, Aricept at bedtime, Cymbalta 60 mg daily, BuSpar 5 mg twice daily trazodone 100 mg at bedtime - Currently continued on these.  Continue to monitor mental status  Mildly elevated troponin History of CAD HLD Chest pain likely due to rib fractures.  Only mildly elevated troponin.   Continue Crestor.  Does not seem to be on any endocrine/anticoagulant. Recent Labs    02/20/22 1600 02/20/22 1929  TROPONINIHS 22* 21*   Essential hypertension Blood pressure trending up.  180s this afternoon I did not see any BP meds on her home list.  Started on Coreg 3.125 mg twice daily and hydralazine as needed.  History of right breast cancer Continue anastrozole   Acquired hypothyroidism Continue Synthroid    GERD  Continue PPI    Goals of care, counseling/discussion Confirmed DNR/DNI status on admission with daughter Prognosis guarded   Goals  of care   Code Status: DNR    Mobility: PT eval ordered  Skin assessment:     Nutritional status:  There is no height or weight on file to calculate BMI.          Diet:  Diet Order             DIET DYS 3 Room service appropriate? Yes; Fluid consistency: Thin  Diet effective now                   DVT prophylaxis:  enoxaparin (LOVENOX) injection 40 mg Start: 02/20/22 2230   Antimicrobials: IV Rocephin, IV azithromycin Fluid: None Consultants: None Family Communication: Cussed with daughter at bedside  Status is: Inpatient  Continue in-hospital care because: Improving respiratory status, need 1-2 more days of monitoring Level of care: Stepdown   Dispo: The patient is from: Home              Anticipated d/c is to: Pending clinical course, pending PT eval, likely will need SNF placement              Patient currently is not medically stable to d/c.   Difficult to place patient No     Infusions:   azithromycin Stopped (02/21/22 2335)   cefTRIAXone (ROCEPHIN)  IV Stopped (02/21/22 2224)    Scheduled Meds:  acetaminophen  1,000 mg Oral Once   acetaminophen  650 mg Oral BID   anastrozole  1 mg Oral Daily   aspirin EC  81 mg Oral Daily   busPIRone  5 mg Oral BID   carvedilol  3.125 mg Oral BID WC   Chlorhexidine Gluconate Cloth  6 each Topical Q0600   donepezil  5 mg Oral QHS   DULoxetine  60 mg Oral Daily   enoxaparin (LOVENOX) injection  40 mg Subcutaneous Q24H   levothyroxine  112 mcg Oral Q0600   lidocaine  1 patch Transdermal Q24H   methylPREDNISolone (SOLU-MEDROL) injection  60 mg Intravenous Q12H   mouth rinse  15 mL Mouth Rinse 4 times per day   pantoprazole (PROTONIX) IV  40 mg Intravenous Q24H   rosuvastatin  10 mg Oral Daily   traZODone  100 mg Oral QHS    PRN meds: acetaminophen **OR** acetaminophen, ALPRAZolam, fluticasone, haloperidol lactate, hydrALAZINE, ipratropium-albuterol, nitroGLYCERIN, ondansetron **OR** ondansetron (ZOFRAN) IV,  mouth rinse, oxyCODONE-acetaminophen, polyethylene glycol   Antimicrobials: Anti-infectives (From admission, onward)    Start     Dose/Rate Route Frequency Ordered Stop   02/20/22 2230  cefTRIAXone (ROCEPHIN) 2 g in sodium chloride 0.9 % 100 mL IVPB        2 g 200 mL/hr over 30 Minutes Intravenous Every 24 hours 02/20/22 2131     02/20/22 2230  azithromycin (ZITHROMAX) 500 mg in sodium chloride 0.9 % 250 mL IVPB        500 mg 250 mL/hr over 60 Minutes Intravenous Every 24 hours 02/20/22 2131         Objective: Vitals:   02/22/22 0852 02/22/22 0900  BP: (!) 165/106   Pulse: 98 99  Resp: (!) 31 19  Temp:    SpO2: 94% 95%    Intake/Output Summary (Last 24 hours) at 02/22/2022 1027 Last data filed at 02/22/2022 0700 Gross  per 24 hour  Intake 710 ml  Output 1200 ml  Net -490 ml   There were no vitals filed for this visit. Weight change:  There is no height or weight on file to calculate BMI.   Physical Exam:  Awake, confused, frail, extremely hard of hearing, in no apparent distress Symmetrical Chest wall movement, managed air entry at right lung base RRR,No Gallops,Rubs or new Murmurs, No Parasternal Heave +ve B.Sounds, Abd Soft, No tenderness, No rebound - guarding or rigidity. No Cyanosis, Clubbing or edema, No new Rash or bruise    Data Review: I have personally reviewed the laboratory data and studies available.  F/u labs ordered Unresulted Labs (From admission, onward)     Start     Ordered   02/23/22 0500  CBC  Tomorrow morning,   R       Question:  Specimen collection method  Answer:  Lab=Lab collect   02/22/22 1027   02/23/22 2820  Basic metabolic panel  Tomorrow morning,   R       Question:  Specimen collection method  Answer:  Lab=Lab collect   02/22/22 1027   02/22/22 1026  Blood gas, arterial  Once,   R        02/22/22 1026   02/20/22 2142  Legionella Pneumophila Serogp 1 Ur Ag  (COPD / Pneumonia / Cellulitis / Lower Extremity Wound)  Once,   R         02/20/22 2142            Signed, Phillips Climes, MD [facility] 02/22/2022

## 2022-02-22 NOTE — TOC Progression Note (Signed)
Transition of Care [facility]) - Progression Note    Patient Details  Name: [name] MRN: [id number] Date of Birth: [birth year]  Transition [facility]) CM/SW Contact  Marieme Mcmackin, Juliann Pulse, RN Phone Number: 02/22/2022, 3:23 PM  Clinical Narrative: spoke to dtr-[name] agree to fax out for SNF. Prefer [address]Has gone [place], & [facility] in past.      Expected Discharge Plan: [place] Barriers to Discharge: Continued Medical Work up  Expected Discharge Plan and Services Expected Discharge Plan: [place]   Discharge Planning Services: CM Consult Post Acute Care Choice: [place] Living arrangements for the past 2 months: Single Family Home                                       Social Determinants of Health (SDOH) Interventions    Readmission Risk Interventions    02/21/2022   11:13 AM 12/13/2021    1:29 PM  Readmission Risk Prevention Plan  Transportation Screening Complete   Medication Review (RN Care Manager) Complete   PCP or Specialist appointment within 3-5 days of discharge Complete Complete  HRI or Home Care Consult Complete Complete  Palliative Care Screening Not Applicable Not Applicable  Skilled Nursing Facility Complete Complete

## 2022-02-22 NOTE — NC FL2 (Signed)
[place] LEVEL OF CARE SCREENING TOOL     IDENTIFICATION  Patient Name: [name] Birthdate: 1931-01-08 Sex: female Admission Date (Current Location): 02/20/2022  [facility] [place] Number:  Herbalist and Address:  [facility],  [place], [place]      Provider Number: [id number]  Attending Physician Name and Address:  Elgergawy, Silver Huguenin, MD  Relative Name and Phone Number:   [name](dtr) [phone])    Current Level of Care: Hospital Recommended Level of Care: [place] Prior Approval Number:    Date Approved/Denied:   PASRR Number:    Discharge Plan: SNF    Current Diagnoses: Patient Active Problem List   Diagnosis Date Noted   Confusion 02/21/2022   Acute on chronic respiratory failure with hypoxia and hypercapnia ([place]) 02/20/2022   Aspiration pneumonia of right lower lobe ([place]) 02/20/2022   Elevated troponin level not due myocardial infarction 02/20/2022   Dementia with behavioral disturbance ([place]) 02/20/2022   Multiple fractures of ribs, right side, subsequent encounter for fracture with routine healing 02/20/2022   COPD with acute exacerbation ([place]) 47/82/9562   Acute metabolic encephalopathy 13/02/6577   Ground-level fall 12/06/2021   Breast cancer ([place])    Chronic systolic CHF (congestive heart failure) ([place])    Syncope 09/25/2021   Acute on chronic respiratory failure with hypoxia ([place]) 07/07/2020   Generalized weakness 07/07/2020   Decreased appetite 09/20/2019   Lower GI bleeding 09/09/2019   DNR (do not resuscitate) 09/08/2019   Chronic anemia 09/07/2019   Macrocytic anemia 09/07/2019   Jerking 09/07/2019   Fall at home, initial encounter 09/07/2019   History of CVA (cerebrovascular accident) 09/07/2019   History of right breast cancer 09/07/2019   Pelvic pain 03/25/2019   Dysphagia 02/18/2019   Coronary artery disease involving native coronary artery of  native heart without angina pectoris 01/30/2019   Presence of drug coated stent in right coronary artery 01/30/2019   Left main coronary artery disease - ~50-60% dLM (&70% dLAD) -- Plan Med Rx 01/30/2019   Ischemic cardiomyopathy 01/30/2019   AAA (abdominal aortic aneurysm) ([place]) 12/31/2018   Malignant neoplasm of upper-outer quadrant of right breast in female, estrogen receptor positive ([place]) 09/26/2018   Breast mass 08/28/2018   Bilateral impacted cerumen 08/14/2017   Laryngopharyngeal reflux (LPR) 08/14/2017   Presbycusis of both ears 08/14/2017   Thoracic aortic aneurysm 04/10/2017   Abdominal pain 04/05/2017   Low back pain 01/01/2017   Medicare annual wellness visit, subsequent 09/26/2016   Hard of hearing 09/26/2016   Vitamin D deficiency 09/26/2016   Goals of care, counseling/discussion 09/26/2016   Encounter for chronic pain management 07/26/2016   Edema 10/21/2014   Multinodular thyroid 07/07/2013   COPD (chronic obstructive pulmonary disease) ([place]) 06/09/2013   HTN (hypertension) 06/09/2013   Chronic respiratory failure with hypoxia ([place]) 05/13/2013   Cough 05/02/2013   Vertigo 12/31/2012   Acute blood loss anemia 12/13/2011   Osteoarthritis of right knee 08/27/2011   Gold C Copd with asthmatic bronchitis component and chronic resp failure 06/26/2011   Neck pain 04/02/2011   RISK OF FALLING 08/17/2010   Hypokalemia 05/24/2009   Anemia, unspecified 04/10/2008   Anxiety 03/01/2008   Osteoporosis 06/23/2007   Chronic pain syndrome 12/26/2006   Acquired hypothyroidism 12/24/2006   Mixed hyperlipidemia 12/24/2006   COMMON MIGRAINE 12/24/2006   GERD without esophagitis 12/24/2006   DEGENERATIVE DISC DISEASE 12/24/2006   Fibromyalgia 12/24/2006   Insomnia  12/24/2006   HYPERGLYCEMIA 12/24/2006    Orientation RESPIRATION BLADDER Height & Weight     Self, Situation  O2 Incontinent Weight:   Height:     BEHAVIORAL SYMPTOMS/MOOD NEUROLOGICAL BOWEL NUTRITION STATUS       Incontinent Diet (Dysphagia 3)  AMBULATORY STATUS COMMUNICATION OF NEEDS Skin   Limited Assist Verbally Normal                       Personal Care Assistance Level of Assistance  Bathing, Feeding, Dressing Bathing Assistance: Limited assistance Feeding assistance: Limited assistance Dressing Assistance: Limited assistance     Functional Limitations Info  Sight, Hearing, Speech Sight Info: Impaired (eyeglasses) Hearing Info: Impaired (L ear aid) Speech Info: Adequate    SPECIAL CARE FACTORS FREQUENCY  PT (By licensed PT), OT (By licensed OT)     PT Frequency:  (5x week) OT Frequency:  (5x week)            Contractures Contractures Info: Not present    Additional Factors Info  Code Status, Allergies, Psychotropic Code Status Info:  (DNR) Allergies Info:  (Doxycycline, Sulfadiazine, Sulfa Antibiotics) Psychotropic Info:  (xanax,buspar see MAR)         Current Medications (02/22/2022):  This is the current hospital active medication list Current Facility-Administered Medications  Medication Dose Route Frequency Provider Last Rate Last Admin   acetaminophen (TYLENOL) tablet 650 mg  650 mg Oral Q6H PRN Vernelle Emerald, MD   650 mg at 02/21/22 1610   Or   acetaminophen (TYLENOL) suppository 650 mg  650 mg Rectal Q6H PRN Shalhoub, Sherryll Burger, MD       acetaminophen (TYLENOL) tablet 1,000 mg  1,000 mg Oral Once Vernelle Emerald, MD       acetaminophen (TYLENOL) tablet 650 mg  650 mg Oral BID Vernelle Emerald, MD   650 mg at 02/22/22 1100   ALPRAZolam (XANAX) tablet 0.5 mg  0.5 mg Oral TID PRN Terrilee Croak, MD   0.5 mg at 02/22/22 1100   anastrozole (ARIMIDEX) tablet 1 mg  1 mg Oral Daily Shalhoub, Sherryll Burger, MD   1 mg at 02/22/22 1101   aspirin EC tablet 81 mg  81 mg Oral Daily Shalhoub, Sherryll Burger, MD   81 mg at 02/22/22 1101   azithromycin (ZITHROMAX) 500 mg in sodium chloride 0.9 % 250 mL IVPB  500 mg Intravenous Q24H Shalhoub, Sherryll Burger, MD   Stopped at 02/21/22  2335   busPIRone (BUSPAR) tablet 5 mg  5 mg Oral BID Vernelle Emerald, MD   5 mg at 02/22/22 1100   carvedilol (COREG) tablet 3.125 mg  3.125 mg Oral BID WC Dahal, Marlowe Aschoff, MD   3.125 mg at 02/22/22 0852   cefTRIAXone (ROCEPHIN) 2 g in sodium chloride 0.9 % 100 mL IVPB  2 g Intravenous Q24H Shalhoub, Sherryll Burger, MD   Stopped at 02/21/22 2224   Chlorhexidine Gluconate Cloth 2 % PADS 6 each  6 each Topical Q0600 Vernelle Emerald, MD   6 each at 02/22/22 0545   donepezil (ARICEPT) tablet 5 mg  5 mg Oral QHS Shalhoub, Sherryll Burger, MD   5 mg at 02/21/22 2129   DULoxetine (CYMBALTA) DR capsule 60 mg  60 mg Oral Daily Vernelle Emerald, MD   60 mg at 02/22/22 1101   enoxaparin (LOVENOX) injection 40 mg  40 mg Subcutaneous Q24H Vernelle Emerald, MD   40 mg at 02/21/22 2130   fluticasone (  FLONASE) 50 MCG/ACT nasal spray 2 spray  2 spray Each Nare Daily PRN Shalhoub, Sherryll Burger, MD       haloperidol lactate (HALDOL) injection 2 mg  2 mg Intravenous Q6H PRN Dahal, Marlowe Aschoff, MD   2 mg at 02/21/22 1637   hydrALAZINE (APRESOLINE) injection 10 mg  10 mg Intravenous Q6H PRN Dahal, Marlowe Aschoff, MD   10 mg at 02/22/22 1116   ipratropium-albuterol (DUONEB) 0.5-2.5 (3) MG/3ML nebulizer solution 3 mL  3 mL Nebulization Q4H PRN Shalhoub, Sherryll Burger, MD       levothyroxine (SYNTHROID) tablet 112 mcg  112 mcg Oral Q0600 Vernelle Emerald, MD   112 mcg at 02/22/22 0639   lidocaine (LIDODERM) 5 % 1 patch  1 patch Transdermal Q24H Shalhoub, Sherryll Burger, MD       methylPREDNISolone sodium succinate (SOLU-MEDROL) 125 mg/2 mL injection 60 mg  60 mg Intravenous Q12H Shalhoub, Sherryll Burger, MD   60 mg at 02/22/22 1100   nitroGLYCERIN (NITROSTAT) SL tablet 0.4 mg  0.4 mg Sublingual Q5 min PRN Shalhoub, Sherryll Burger, MD       ondansetron [facility]) tablet 4 mg  4 mg Oral Q6H PRN Shalhoub, Sherryll Burger, MD       Or   ondansetron (ZOFRAN) injection 4 mg  4 mg Intravenous Q6H PRN Shalhoub, Sherryll Burger, MD       Oral care mouth rinse  15 mL Mouth Rinse 4 times  per day Vernelle Emerald, MD   15 mL at 02/22/22 1101   Oral care mouth rinse  15 mL Mouth Rinse PRN Shalhoub, Sherryll Burger, MD       oxyCODONE-acetaminophen (PERCOCET/ROXICET) 5-325 MG per tablet 1 tablet  1 tablet Oral Q6H PRN Dahal, Marlowe Aschoff, MD   1 tablet at 02/22/22 1100   pantoprazole (PROTONIX) injection 40 mg  40 mg Intravenous Q24H Vernelle Emerald, MD   40 mg at 02/21/22 2130   polyethylene glycol (MIRALAX / GLYCOLAX) packet 17 g  17 g Oral Daily PRN Vernelle Emerald, MD       rosuvastatin (CRESTOR) tablet 10 mg  10 mg Oral Daily Shalhoub, Sherryll Burger, MD   10 mg at 02/22/22 1100   traZODone (DESYREL) tablet 100 mg  100 mg Oral QHS Vernelle Emerald, MD   100 mg at 02/21/22 2129     Discharge Medications: Please see discharge summary for a list of discharge medications.  Relevant Imaging Results:  Relevant Lab Results:   Additional Information  (ss#[id number])  Advik Weatherspoon, Juliann Pulse, RN

## 2022-02-23 ENCOUNTER — Ambulatory Visit: Payer: Self-pay | Admitting: *Deleted

## 2022-02-23 DIAGNOSIS — J9621 Acute and chronic respiratory failure with hypoxia: Secondary | ICD-10-CM | POA: Diagnosis not present

## 2022-02-23 DIAGNOSIS — J9622 Acute and chronic respiratory failure with hypercapnia: Secondary | ICD-10-CM | POA: Diagnosis not present

## 2022-02-23 LAB — BASIC METABOLIC PANEL
Anion gap: 10 (ref 5–15)
BUN: 13 mg/dL (ref 8–23)
CO2: 36 mmol/L — ABNORMAL HIGH (ref 22–32)
Calcium: 8.8 mg/dL — ABNORMAL LOW (ref 8.9–10.3)
Chloride: 98 mmol/L (ref 98–111)
Creatinine, Ser: 0.51 mg/dL (ref 0.44–1.00)
GFR, Estimated: >60 mL/min (ref >60)
Glucose, Bld: 119 mg/dL — ABNORMAL HIGH (ref 70–99)
Potassium: 2.9 mmol/L — ABNORMAL LOW (ref 3.5–5.1)
Sodium: 144 mmol/L (ref 135–145)

## 2022-02-23 LAB — CBC
HCT: 36.6 % (ref 36.0–46.0)
Hemoglobin: 11.8 g/dL — ABNORMAL LOW (ref 12.0–15.0)
MCH: 32.6 pg (ref 26.0–34.0)
MCHC: 32.2 g/dL (ref 30.0–36.0)
MCV: 101.1 fL — ABNORMAL HIGH (ref 80.0–100.0)
Platelets: 175 10*3/uL (ref 150–400)
RBC: 3.62 MIL/uL — ABNORMAL LOW (ref 3.87–5.11)
RDW: 15 % (ref 11.5–15.5)
WBC: 5.4 10*3/uL (ref 4.0–10.5)
nRBC: 0 % (ref 0.0–0.2)

## 2022-02-23 MED ORDER — LISINOPRIL 20 MG PO TABS
20.0000 mg | ORAL_TABLET | Freq: Every day | ORAL | Status: DC
  Administered 2022-02-23 – 2022-02-26 (×4): 20 mg via ORAL
  Filled 2022-02-23 (×4): qty 1

## 2022-02-23 MED ORDER — POTASSIUM CHLORIDE 20 MEQ PO PACK
40.0000 meq | PACK | ORAL | Status: AC
  Administered 2022-02-23 (×2): 40 meq via ORAL
  Filled 2022-02-23 (×2): qty 2

## 2022-02-23 MED ORDER — STERILE WATER FOR INJECTION IJ SOLN
INTRAMUSCULAR | Status: AC
  Administered 2022-02-23: 2 mL
  Filled 2022-02-23: qty 10

## 2022-02-23 MED ORDER — STERILE WATER FOR INJECTION IJ SOLN
INTRAMUSCULAR | Status: AC
  Filled 2022-02-23: qty 10

## 2022-02-23 NOTE — Progress Notes (Signed)
Pt is resting well at this time. Bipap not needed. Pt is in no resp distress.

## 2022-02-23 NOTE — Patient Outreach (Signed)
  Care Coordination   Follow Up Visit Note   02/23/2022 Name: [name] MRN: [id number] DOB: [birth year]  [name] is a 88 y.o. year old female who sees Alice Ghent, MD for primary care. I spoke with  local daughter, Alice, [name] by phone today  What matters to the patients health and wellness today?  Hospitalized now- planning for SNF rehab.    Goals Addressed   None     SDOH assessments and interventions completed:  Yes     Care Coordination Interventions Activated:  Yes  Care Coordination Interventions:  Yes, provided   Follow up plan: Follow up call scheduled for 02/27/22    Encounter Outcome:  Pt. Visit Completed

## 2022-02-23 NOTE — Patient Outreach (Signed)
Pt remains in hospital today with plans for SNF rehab at dc.   Eduard Clos MSW, LCSW Licensed Clinical Social Worker [place]   [phone]

## 2022-02-23 NOTE — Progress Notes (Signed)
PROGRESS NOTE    [name]  LPF:[id number] DOB: 07/26/1930 DOA: 02/20/2022 PCP: Tonia Ghent, MD   Brief Narrative:  [name] is a 88 y.o. female with PMH significant for COPD on 3 LPM O2, mild chronic systolic CHF (Echo 11/3297 EF 40-45%), CAD (STEMI 01/2019 with DES to RCA), right breast cancer (ER positive invasive ductal carcinoma on Anastrozole), gastroesophageal reflux disease, hyperlipidemia, anemia of chronic disease, hypothyroidism and mild dementia Patient was brought to the ED on 8/15 with worsening confusion. 8/13, patient fell at home while attempting to get out of bed.  She was found by daughter with her walker on top of her.  She complained of right-sided chest pain and was brought to the ED.  CT chest showed mildly displaced fracture of the posterior right ninth and 10th ribs, compression fracture of the T6 as well as anterior wedge compression fracture of T7.  Patient was treated for pain with fentanyl, Motrin, Tylenol.  She did not meet criteria for admission and hence was discharged to home. At home, she has been experiencing progressively worsening pain, agitation and confusion.  On 8/15, seen by PCP and sent to ED again. In the ED, patient was agitated and intermittently following commands.  Afebrile, hemodynamically stable. VBG showed compensated respiratory acidosis with significantly elevated PCO2 at 115 and bicarbonate at 59.  Baseline CO2 around 70. BiPAP was initiated Admitted to hospitalist service for further evaluation management.  Assessment & Plan:   Principal Problem:   Acute on chronic respiratory failure with hypoxia and hypercapnia (HCC) Active Problems:   COPD with acute exacerbation (HCC)   Aspiration pneumonia of right lower lobe (HCC)   Acute metabolic encephalopathy   Coronary artery disease involving native coronary artery of native heart without angina pectoris   Multiple fractures of ribs, right side, subsequent encounter  for fracture with routine healing   Elevated troponin level not due myocardial infarction   History of right breast cancer   Acquired hypothyroidism   Mixed hyperlipidemia   GERD without esophagitis   Dementia with behavioral disturbance (HCC)   Goals of care, counseling/discussion  Acute on chronic respiratory failure with hypoxia and hypercapnia  - Presented with progressively worsening confusion, agitation after 2 days of rib fractures.  Patient probably had limitation in lung expansion that worsened her respiratory failure precipitating significantly elevated CO2.  Required BiPAP.  - DNR/DNI per family, he has improved significantly, has not required BiPAP since 02/21/2022.  Currently on 3 L oxygen which is at baseline.   Aspiration pneumonia Acute exacerbation of COPD - CT chest on 8/13 showed developing right lower lobe infiltrate concerning for aspiration pneumonia. Currently on IV Rocephin, azithromycin (QTc 437 ms) and IV Solu-Medrol 60 mg twice daily.  .Continue with IV antibiotics c -She was encouraged to use incentive spirometry . -Continue with bronchodilators. -PT consulted, encouraged to ambulate. -SLP consulted given history of dysphagia.    Right ninth and 10th rib fractures - After a fall on 8/13. - Conservative management with adequate pain control. - Currently on Tylenol and Lidoderm patch.  Pain is very well controlled.   Acute metabolic encephalopathy Underlying dementia - Acute agitation secondary to hypercapnia.  - PTA on Xanax as needed, Aricept at bedtime, Cymbalta 60 mg daily, BuSpar 5 mg twice daily trazodone 100 mg at bedtime - Currently continued on these.  Continue to monitor mental status   Mildly elevated troponin History of CAD HLD Chest pain likely due to rib fractures.  Only  mildly elevated troponin.   Continue Crestor.   Chronic systolic congestive heart failure: Echo done in 2020 shows 45% ejection fraction.  Currently  euvolemic.  Essential hypertension: Does not seem to be taking any medications for this PTA.  Has been started on Coreg during this hospitalization.  Blood pressure still significantly elevated.  Due to history of systolic CHF, I will start her on lisinopril 20 mg, she is already on as needed hydralazine.   History of right breast cancer Continue anastrozole   Acquired hypothyroidism Continue Synthroid    GERD  Continue PPI  Hypokalemia: 2.9.  Replace.  Repeat labs in the morning.    Goals of care, counseling/discussion Confirmed DNR/DNI status on admission with daughter Prognosis guarded   DVT prophylaxis: enoxaparin (LOVENOX) injection 40 mg Start: 02/20/22 2230   Code Status: DNR  Family Communication:  None present at bedside.  Plan of care discussed with patient in length and he/she verbalized understanding and agreed with it.  Status is: Inpatient Remains inpatient appropriate because: TOC working on finding SNF.  Per TOC, the 2 offers that patient has are not acceptable to the family and family wants [facility] but [facility] is not willing to consider her until Monday.  Family needs to pick a place as patient is medically stable for discharge tomorrow.   Estimated body mass index is 23.43 kg/m as calculated from the following:   Height as of an earlier encounter on 02/20/22: '5\' 1"'$  (1.549 m).   Weight as of an earlier encounter on 02/20/22: 56.2 kg.    Nutritional Assessment: There is no height or weight on file to calculate BMI.. Seen by dietician.  I agree with the assessment and plan as outlined below: Nutrition Status:        . Skin Assessment: I have examined the patient's skin and I agree with the wound assessment as performed by the wound care RN as outlined below:    Consultants:  None  Procedures:  None  Antimicrobials:  Anti-infectives (From admission, onward)    Start     Dose/Rate Route Frequency Ordered Stop   02/20/22 2230  cefTRIAXone  (ROCEPHIN) 2 g in sodium chloride 0.9 % 100 mL IVPB        2 g 200 mL/hr over 30 Minutes Intravenous Every 24 hours 02/20/22 2131     02/20/22 2230  azithromycin (ZITHROMAX) 500 mg in sodium chloride 0.9 % 250 mL IVPB        500 mg 250 mL/hr over 60 Minutes Intravenous Every 24 hours 02/20/22 2131           Subjective: Patient seen and examined.  She has no complaints.  She is fully alert and oriented.  She denies any shortness of breath.  She is able to speak in full sentences.  Appears comfortable.  Objective: Vitals:   02/22/22 1808 02/22/22 1955 02/23/22 0531 02/23/22 1416  BP: (!) 173/106 (!) 140/82 (!) 117/90 (!) 182/110  Pulse: 99 (!) 103 (!) 105 (!) 108  Resp: '19 20 20 19  '$ Temp: 98.3 F (36.8 C) 98.4 F (36.9 C) (!) 97.5 F (36.4 C) 98.2 F (36.8 C)  TempSrc: Oral Oral Oral Oral  SpO2: 97% 95% 95% 96%    Intake/Output Summary (Last 24 hours) at 02/23/2022 1456 Last data filed at 02/23/2022 1300 Gross per 24 hour  Intake 617.06 ml  Output 750 ml  Net -132.94 ml   There were no vitals filed for this visit.  Examination:  General exam: Appears calm and comfortable  Respiratory system: Diminished breath sounds at the bases bilaterally, respiratory effort normal. Cardiovascular system: S1 & S2 heard, RRR. No JVD, murmurs, rubs, gallops or clicks. No pedal edema. Gastrointestinal system: Abdomen is nondistended, soft and nontender. No organomegaly or masses felt. Normal bowel sounds heard. Central nervous system: Alert and oriented. No focal neurological deficits. Extremities: Symmetric 5 x 5 power. Skin: No rashes, lesions or ulcers Psychiatry: Judgement and insight appear normal. Mood & affect appropriate.    Data Reviewed: I have personally reviewed following labs and imaging studies  CBC: Recent Labs  Lab 02/19/22 0535 02/20/22 1600 02/21/22 0322 02/22/22 0239 02/23/22 0539  WBC 5.1 5.6 4.6 4.1 5.4  NEUTROABS  --  4.0 4.3 3.7  --   HGB 10.4* 10.9*  9.8* 9.3* 11.8*  HCT 34.8* 36.9 32.5* 29.6* 36.6  MCV 108.1* 109.2* 108.0* 102.8* 101.1*  PLT 146* 143* 132* 128* 073   Basic Metabolic Panel: Recent Labs  Lab 02/19/22 0050 02/20/22 1600 02/21/22 0322 02/22/22 0239 02/23/22 0539  NA 145 144 143 141 144  K 3.4* 3.5 3.7 3.7 2.9*  CL 96* 95* 97* 95* 98  CO2 43* 42* 37* 37* 36*  GLUCOSE 124* 139* 149* 131* 119*  BUN '10 16 17 15 13  '$ CREATININE 0.56 0.70 0.49 0.43* 0.51  CALCIUM 8.8* 9.2 8.7* 8.4* 8.8*  MG  --   --  1.7  --   --    GFR: Estimated Creatinine Clearance: 36 mL/min (by C-G formula based on SCr of 0.51 mg/dL). Liver Function Tests: Recent Labs  Lab 02/20/22 1600 02/21/22 0322  AST 27 25  ALT 12 10  ALKPHOS 45 37*  BILITOT 0.7 0.8  PROT 7.0 6.2*  ALBUMIN 3.6 3.0*   No results for input(s): "LIPASE", "AMYLASE" in the last 168 hours. No results for input(s): "AMMONIA" in the last 168 hours. Coagulation Profile: No results for input(s): "INR", "PROTIME" in the last 168 hours. Cardiac Enzymes: Recent Labs  Lab 02/19/22 0050  CKTOTAL 31*   BNP (last 3 results) No results for input(s): "PROBNP" in the last 8760 hours. HbA1C: No results for input(s): "HGBA1C" in the last 72 hours. CBG: Recent Labs  Lab 02/20/22 1711  GLUCAP 118*   Lipid Profile: No results for input(s): "CHOL", "HDL", "LDLCALC", "TRIG", "CHOLHDL", "LDLDIRECT" in the last 72 hours. Thyroid Function Tests: No results for input(s): "TSH", "T4TOTAL", "FREET4", "T3FREE", "THYROIDAB" in the last 72 hours. Anemia Panel: No results for input(s): "VITAMINB12", "FOLATE", "FERRITIN", "TIBC", "IRON", "RETICCTPCT" in the last 72 hours. Sepsis Labs: No results for input(s): "PROCALCITON", "LATICACIDVEN" in the last 168 hours.  Recent Results (from the past 240 hour(s))  MRSA Next Gen by PCR, Nasal     Status: None   Collection Time: 02/20/22  7:48 PM   Specimen: Nasal Mucosa; Nasal Swab  Result Value Ref Range Status   MRSA by PCR Next Gen NOT  DETECTED NOT DETECTED Final    Comment: (NOTE) The GeneXpert MRSA Assay (FDA approved for NASAL specimens only), is one component of a comprehensive MRSA colonization surveillance program. It is not intended to diagnose MRSA infection nor to guide or monitor treatment for MRSA infections. Test performance is not FDA approved in patients less than 86 years old. Performed at [facility], [place] [address] [place],  [address]   Culture, blood (routine x 2) Call MD if unable to obtain prior to antibiotics being given     Status:  None (Preliminary result)   Collection Time: 02/20/22 10:43 PM   Specimen: BLOOD  Result Value Ref Range Status   Specimen Description   Final    BLOOD BLOOD LEFT FOREARM Performed at [place] [address] [place], [place] [address]    Special Requests   Final    BOTTLES DRAWN AEROBIC ONLY Blood Culture adequate volume Performed at [facility] [address], [place], [place] [address]    Culture   Final    NO GROWTH 2 DAYS Performed at [facility] [address], [place], [place] [address]    Report Status PENDING  Incomplete  Culture, blood (routine x 2) Call MD if unable to obtain prior to antibiotics being given     Status: None (Preliminary result)   Collection Time: 02/20/22 10:43 PM   Specimen: BLOOD  Result Value Ref Range Status   Specimen Description   Final    BLOOD BLOOD LEFT HAND Performed at [facility] [address] [place], [place] [address]    Special Requests   Final    BOTTLES DRAWN AEROBIC ONLY Blood Culture results may not be optimal due to an inadequate volume of blood received in culture bottles Performed at [facility] [address], [place], [place] [address]    Culture   Final    NO GROWTH 2 DAYS Performed at [facility] [address] [place],  [address]    Report Status PENDING   Incomplete     Radiology Studies: No results found.  Scheduled Meds:  acetaminophen  1,000 mg Oral Once   acetaminophen  650 mg Oral BID   anastrozole  1 mg Oral Daily   aspirin EC  81 mg Oral Daily   busPIRone  5 mg Oral BID   carvedilol  3.125 mg Oral BID WC   Chlorhexidine Gluconate Cloth  6 each Topical Q0600   donepezil  5 mg Oral QHS   DULoxetine  60 mg Oral Daily   enoxaparin (LOVENOX) injection  40 mg Subcutaneous Q24H   levothyroxine  112 mcg Oral Q0600   lidocaine  1 patch Transdermal Q24H   methylPREDNISolone (SOLU-MEDROL) injection  60 mg Intravenous Q12H   mouth rinse  15 mL Mouth Rinse 4 times per day   pantoprazole (PROTONIX) IV  40 mg Intravenous Q24H   rosuvastatin  10 mg Oral Daily   traZODone  100 mg Oral QHS   Continuous Infusions:  azithromycin 500 mg (02/22/22 2333)   cefTRIAXone (ROCEPHIN)  IV 2 g (02/22/22 2258)     LOS: 3 days   Darliss Cheney, MD [facility]  02/23/2022, 2:56 PM   *Please note that this is a verbal dictation therefore any spelling or grammatical errors are due to the "[place] [employer]" system interpretation.  Please page via [place] and do not message via secure chat for urgent patient care matters. Secure chat can be used for non urgent patient care matters.  How to contact the [facility] Attending or Consulting provider [place] or covering provider during after hours [place], for this patient?  Check the care team in [facility] and look for a) attending/consulting TRH provider listed and b) the [facility] team listed. Page or secure chat [phone]. Log into [email or web address] and use [place]'s universal password to access. If you do not have the password, please contact the hospital operator. Locate the [facility] provider you are looking for under  [facility] and page to a number that you can be directly reached. If you still have difficulty reaching the provider, please page the [facility] (Director on Call) for the Hospitalists listed on amion for  assistance.

## 2022-02-23 NOTE — TOC Progression Note (Addendum)
Transition of Care [facility]) - Progression Note    Patient Details  Name: [name] MRN: [id number] Date of Birth: [birth year]  Transition of Care [facility]) CM/SW [place], LCSW Phone Number: 02/23/2022, 1:24 PM  Clinical Narrative:    CSW spoke with pt's daughter Tresa Res ([phone]) and reviewed bed offers. [place] are unable to extend bed offers for this pt at this time. Pt's daughter is unsure about placement in SNF's that have extended bed offers and requested to contact [facility] for availability. CSW contacted [place] who will re-evaluate this pt on Monday to see if pt's anxiety/agitation has improved in order to be able to extend bed offer.   Update 1500: Pt is medically ready for discharge. CSW attempted to reach pt's daughter [name]. CSW called pt's other daughter, [name] and discussed need to choose SNF bed offer as pt is medically ready for discharge. CSW re-reviewed bed offers with pt's daughter who is to research each facility prior to making a decision for placement.   Expected Discharge Plan: [place] Barriers to Discharge: Continued Medical Work up  Expected Discharge Plan and Services Expected Discharge Plan: [place]   Discharge Planning Services: CM Consult Post Acute Care Choice: [place] Living arrangements for the past 2 months: Single Family Home                                       Social Determinants of Health (SDOH) Interventions    Readmission Risk Interventions    02/21/2022   11:13 AM 12/13/2021    1:29 PM  Readmission Risk Prevention Plan  Transportation Screening Complete   Medication Review (RN Care Manager) Complete   PCP or Specialist appointment within 3-5 days of discharge Complete Complete  HRI or Home Care Consult Complete Complete  Palliative Care Screening Not Applicable Not Applicable  Skilled Nursing Facility Complete  Complete

## 2022-02-24 DIAGNOSIS — J9621 Acute and chronic respiratory failure with hypoxia: Secondary | ICD-10-CM | POA: Diagnosis not present

## 2022-02-24 DIAGNOSIS — J9622 Acute and chronic respiratory failure with hypercapnia: Secondary | ICD-10-CM | POA: Diagnosis not present

## 2022-02-24 LAB — BASIC METABOLIC PANEL
Anion gap: 11 (ref 5–15)
BUN: 17 mg/dL (ref 8–23)
CO2: 31 mmol/L (ref 22–32)
Calcium: 8.8 mg/dL — ABNORMAL LOW (ref 8.9–10.3)
Chloride: 97 mmol/L — ABNORMAL LOW (ref 98–111)
Creatinine, Ser: 0.52 mg/dL (ref 0.44–1.00)
GFR, Estimated: >60 mL/min (ref >60)
Glucose, Bld: 122 mg/dL — ABNORMAL HIGH (ref 70–99)
Potassium: 3.8 mmol/L (ref 3.5–5.1)
Sodium: 139 mmol/L (ref 135–145)

## 2022-02-24 MED ORDER — STERILE WATER FOR INJECTION IJ SOLN
INTRAMUSCULAR | Status: AC
  Administered 2022-02-25: 2 mL
  Filled 2022-02-24: qty 10

## 2022-02-24 MED ORDER — AZITHROMYCIN 250 MG PO TABS
500.0000 mg | ORAL_TABLET | Freq: Every day | ORAL | Status: DC
  Administered 2022-02-24: 500 mg via ORAL
  Filled 2022-02-24: qty 2

## 2022-02-24 MED ORDER — PANTOPRAZOLE SODIUM 40 MG PO TBEC
40.0000 mg | DELAYED_RELEASE_TABLET | Freq: Every day | ORAL | Status: DC
  Administered 2022-02-24 – 2022-02-26 (×3): 40 mg via ORAL
  Filled 2022-02-24 (×3): qty 1

## 2022-02-24 NOTE — Progress Notes (Signed)
PROGRESS NOTE    [name]  WUJ:[id number] DOB: 04/29/1931 DOA: 02/20/2022 PCP: Tonia Ghent, MD   Brief Narrative:  [name] is a 88 y.o. female with PMH significant for COPD on 3 LPM O2, mild chronic systolic CHF (Echo 03/5620 EF 40-45%), CAD (STEMI 01/2019 with DES to RCA), right breast cancer (ER positive invasive ductal carcinoma on Anastrozole), gastroesophageal reflux disease, hyperlipidemia, anemia of chronic disease, hypothyroidism and mild dementia Patient was brought to the ED on 8/15 with worsening confusion. 8/13, patient fell at home while attempting to get out of bed.  She was found by daughter with her walker on top of her.  She complained of right-sided chest pain and was brought to the ED.  CT chest showed mildly displaced fracture of the posterior right ninth and 10th ribs, compression fracture of the T6 as well as anterior wedge compression fracture of T7.  Patient was treated for pain with fentanyl, Motrin, Tylenol.  She did not meet criteria for admission and hence was discharged to home. At home, she has been experiencing progressively worsening pain, agitation and confusion.  On 8/15, seen by PCP and sent to ED again. In the ED, patient was agitated and intermittently following commands.  Afebrile, hemodynamically stable. VBG showed compensated respiratory acidosis with significantly elevated PCO2 at 115 and bicarbonate at 59.  Baseline CO2 around 70. BiPAP was initiated Admitted to hospitalist service for further evaluation management.  Assessment & Plan:   Principal Problem:   Acute on chronic respiratory failure with hypoxia and hypercapnia (HCC) Active Problems:   COPD with acute exacerbation (HCC)   Aspiration pneumonia of right lower lobe (HCC)   Acute metabolic encephalopathy   Coronary artery disease involving native coronary artery of native heart without angina pectoris   Multiple fractures of ribs, right side, subsequent encounter  for fracture with routine healing   Elevated troponin level not due myocardial infarction   History of right breast cancer   Acquired hypothyroidism   Mixed hyperlipidemia   GERD without esophagitis   Dementia with behavioral disturbance (HCC)   Goals of care, counseling/discussion  Acute on chronic respiratory failure with hypoxia and hypercapnia  - Presented with progressively worsening confusion, agitation after 2 days of rib fractures.  Patient probably had limitation in lung expansion that worsened her respiratory failure precipitating significantly elevated CO2.  Required BiPAP.  - DNR/DNI per family, he has improved significantly, has not required BiPAP since 02/21/2022.  Currently on 3 L oxygen which is at baseline.   Aspiration pneumonia Acute exacerbation of COPD - CT chest on 8/13 showed developing right lower lobe infiltrate concerning for aspiration pneumonia. Currently on IV Rocephin, azithromycin (QTc 437 ms) and IV Solu-Medrol 60 mg twice daily.  Will complete 5 days of Solu-Medrol tomorrow morning..Continue with IV antibiotics for total of 5 days as well. -She was encouraged to use incentive spirometry . -Continue with bronchodilators. -PT consulted, encouraged to ambulate. -SLP consulted given history of dysphagia.    Right ninth and 10th rib fractures - After a fall on 8/13. - Conservative management with adequate pain control. - Currently on Tylenol and Lidoderm patch.  Pain is very well controlled.   Acute metabolic encephalopathy Underlying dementia - Acute agitation secondary to hypercapnia.  - PTA on Xanax as needed, Aricept at bedtime, Cymbalta 60 mg daily, BuSpar 5 mg twice daily trazodone 100 mg at bedtime - Currently continued on these.  Continue to monitor mental status   Mildly elevated troponin  History of CAD HLD Chest pain likely due to rib fractures.  Only mildly elevated troponin.   Continue Crestor.   Chronic systolic congestive heart failure:  Echo done in 2020 shows 45% ejection fraction.  Currently euvolemic.  Essential hypertension: Does not seem to be taking any medications for this PTA.  Has been started on Coreg during this hospitalization.  Blood pressure still significantly elevated.  Due to history of systolic CHF, I will start her on lisinopril 20 mg, she is already on as needed hydralazine.   History of right breast cancer Continue anastrozole   Acquired hypothyroidism Continue Synthroid    GERD  Continue PPI  Hypokalemia: Resolved.    Goals of care, counseling/discussion Confirmed DNR/DNI status on admission with daughter Prognosis guarded  Disposition: Patient is medically stable.  Family has been given bed offers from 2 different facilities but they do not like the facilities.  They are waiting for more offers.  Spoke to the daughter in detail.  She is requesting to provide her until Monday because most of the facilities are closed over the weekend.   DVT prophylaxis: enoxaparin (LOVENOX) injection 40 mg Start: 02/20/22 2230   Code Status: DNR  Family Communication:  None present at bedside.  Plan of care discussed with patient in length and he/she verbalized understanding and agreed with it.  Status is: Inpatient Remains inpatient appropriate because: Waiting for family to pick A SNF choice.  Patient otherwise medically stable.   Estimated body mass index is 23.43 kg/m as calculated from the following:   Height as of an earlier encounter on 02/20/22: '5\' 1"'$  (1.549 m).   Weight as of an earlier encounter on 02/20/22: 56.2 kg.    Nutritional Assessment: There is no height or weight on file to calculate BMI.. Seen by dietician.  I agree with the assessment and plan as outlined below: Nutrition Status:        . Skin Assessment: I have examined the patient's skin and I agree with the wound assessment as performed by the wound care RN as outlined below:    Consultants:  None  Procedures:   None  Antimicrobials:  Anti-infectives (From admission, onward)    Start     Dose/Rate Route Frequency Ordered Stop   02/24/22 2200  azithromycin (ZITHROMAX) tablet 500 mg        500 mg Oral Daily at bedtime 02/24/22 1043     02/20/22 2230  cefTRIAXone (ROCEPHIN) 2 g in sodium chloride 0.9 % 100 mL IVPB        2 g 200 mL/hr over 30 Minutes Intravenous Every 24 hours 02/20/22 2131     02/20/22 2230  azithromycin (ZITHROMAX) 500 mg in sodium chloride 0.9 % 250 mL IVPB  Status:  Discontinued        500 mg 250 mL/hr over 60 Minutes Intravenous Every 24 hours 02/20/22 2131 02/24/22 1043         Subjective:  Patient seen and examined.  She says " I am not feeling better" but when asked her to elaborate her symptoms and why she is not feeling better, her answer was " I do not know".  No specific complaint.  Objective: Vitals:   02/23/22 1750 02/23/22 2041 02/24/22 0055 02/24/22 0537  BP: (!) 145/65 (!) 153/76  (!) 128/94  Pulse: 76 95  87  Resp:  18  16  Temp:  98.3 F (36.8 C)  97.9 F (36.6 C)  TempSrc:  Oral  Oral  SpO2:  97% 98% 97% 97%    Intake/Output Summary (Last 24 hours) at 02/24/2022 1250 Last data filed at 02/24/2022 0500 Gross per 24 hour  Intake 688.97 ml  Output --  Net 688.97 ml    There were no vitals filed for this visit.  Examination:  General exam: Appears calm and comfortable, very hard of hearing Respiratory system: Slightly diminished breath sounds, no wheezes or rhonchi. Respiratory effort normal. Cardiovascular system: S1 & S2 heard, RRR. No JVD, murmurs, rubs, gallops or clicks. No pedal edema. Gastrointestinal system: Abdomen is nondistended, soft and nontender. No organomegaly or masses felt. Normal bowel sounds heard. Central nervous system: Alert and oriented. No focal neurological deficits. Extremities: Symmetric 5 x 5 power. Skin: No rashes, lesions or ulcers.    Data Reviewed: I have personally reviewed following labs and imaging  studies  CBC: Recent Labs  Lab 02/19/22 0535 02/20/22 1600 02/21/22 0322 02/22/22 0239 02/23/22 0539  WBC 5.1 5.6 4.6 4.1 5.4  NEUTROABS  --  4.0 4.3 3.7  --   HGB 10.4* 10.9* 9.8* 9.3* 11.8*  HCT 34.8* 36.9 32.5* 29.6* 36.6  MCV 108.1* 109.2* 108.0* 102.8* 101.1*  PLT 146* 143* 132* 128* 798    Basic Metabolic Panel: Recent Labs  Lab 02/20/22 1600 02/21/22 0322 02/22/22 0239 02/23/22 0539 02/24/22 0831  NA 144 143 141 144 139  K 3.5 3.7 3.7 2.9* 3.8  CL 95* 97* 95* 98 97*  CO2 42* 37* 37* 36* 31  GLUCOSE 139* 149* 131* 119* 122*  BUN '16 17 15 13 17  '$ CREATININE 0.70 0.49 0.43* 0.51 0.52  CALCIUM 9.2 8.7* 8.4* 8.8* 8.8*  MG  --  1.7  --   --   --     GFR: Estimated Creatinine Clearance: 36 mL/min (by C-G formula based on SCr of 0.52 mg/dL). Liver Function Tests: Recent Labs  Lab 02/20/22 1600 02/21/22 0322  AST 27 25  ALT 12 10  ALKPHOS 45 37*  BILITOT 0.7 0.8  PROT 7.0 6.2*  ALBUMIN 3.6 3.0*    No results for input(s): "LIPASE", "AMYLASE" in the last 168 hours. No results for input(s): "AMMONIA" in the last 168 hours. Coagulation Profile: No results for input(s): "INR", "PROTIME" in the last 168 hours. Cardiac Enzymes: Recent Labs  Lab 02/19/22 0050  CKTOTAL 31*    BNP (last 3 results) No results for input(s): "PROBNP" in the last 8760 hours. HbA1C: No results for input(s): "HGBA1C" in the last 72 hours. CBG: Recent Labs  Lab 02/20/22 1711  GLUCAP 118*    Lipid Profile: No results for input(s): "CHOL", "HDL", "LDLCALC", "TRIG", "CHOLHDL", "LDLDIRECT" in the last 72 hours. Thyroid Function Tests: No results for input(s): "TSH", "T4TOTAL", "FREET4", "T3FREE", "THYROIDAB" in the last 72 hours. Anemia Panel: No results for input(s): "VITAMINB12", "FOLATE", "FERRITIN", "TIBC", "IRON", "RETICCTPCT" in the last 72 hours. Sepsis Labs: No results for input(s): "PROCALCITON", "LATICACIDVEN" in the last 168 hours.  Recent Results (from the past  240 hour(s))  MRSA Next Gen by PCR, Nasal     Status: None   Collection Time: 02/20/22  7:48 PM   Specimen: Nasal Mucosa; Nasal Swab  Result Value Ref Range Status   MRSA by PCR Next Gen NOT DETECTED NOT DETECTED Final    Comment: (NOTE) The GeneXpert MRSA Assay (FDA approved for NASAL specimens only), is one component of a comprehensive MRSA colonization surveillance program. It is not intended to diagnose MRSA infection nor to guide or monitor treatment for MRSA infections.  Test performance is not FDA approved in patients less than 76 years old. Performed at [facility], [place] [address] [place], [place] [address]   Culture, blood (routine x 2) Call MD if unable to obtain prior to antibiotics being given     Status: None (Preliminary result)   Collection Time: 02/20/22 10:43 PM   Specimen: BLOOD  Result Value Ref Range Status   Specimen Description   Final    BLOOD BLOOD LEFT FOREARM Performed at [facility] [address], [place], [place] [address]    Special Requests   Final    BOTTLES DRAWN AEROBIC ONLY Blood Culture adequate volume Performed at [facility] [address] [place], [place] [address]    Culture   Final    NO GROWTH 3 DAYS Performed at [facility], [place] [address] [place], [place] [address]    Report Status PENDING  Incomplete  Culture, blood (routine x 2) Call MD if unable to obtain prior to antibiotics being given     Status: None (Preliminary result)   Collection Time: 02/20/22 10:43 PM   Specimen: BLOOD  Result Value Ref Range Status   Specimen Description   Final    BLOOD BLOOD LEFT HAND Performed at [facility] [address] [place], [place] [address]    Special Requests   Final    BOTTLES DRAWN AEROBIC ONLY Blood Culture results may not be optimal due to an inadequate volume of blood received in culture bottles Performed at [facility] [address] [place], [place] [address]    Culture   Final    NO GROWTH 3 DAYS Performed at [facility], [place] [address], [place], [place] [address]    Report Status PENDING  Incomplete     Radiology Studies: No results found.  Scheduled Meds:  acetaminophen  1,000 mg Oral Once   acetaminophen  650 mg Oral BID   anastrozole  1 mg Oral Daily   aspirin EC  81 mg Oral Daily   azithromycin  500 mg Oral QHS   busPIRone  5 mg Oral BID   carvedilol  3.125 mg Oral BID WC   Chlorhexidine Gluconate Cloth  6 each Topical Q0600   donepezil  5 mg Oral QHS   DULoxetine  60 mg Oral Daily   enoxaparin (LOVENOX) injection  40 mg Subcutaneous Q24H   levothyroxine  112 mcg Oral Q0600   lidocaine  1 patch Transdermal Q24H   lisinopril  20 mg Oral Daily   methylPREDNISolone (SOLU-MEDROL) injection  60 mg Intravenous Q12H   mouth rinse  15 mL Mouth Rinse 4 times per day   pantoprazole  40 mg Oral Daily   rosuvastatin  10 mg Oral Daily   traZODone  100 mg Oral QHS   Continuous Infusions:  cefTRIAXone (ROCEPHIN)  IV 2 g (02/23/22 2135)     LOS: 4 days   Darliss Cheney, MD [facility]  02/24/2022, 12:50 PM   *Please note that this is a verbal dictation therefore any spelling or grammatical errors are due to the "[place] [employer]" system interpretation.  Please page via [place] and do not message via secure chat for urgent patient care matters. Secure chat can be used for non urgent patient care matters.  How to contact the [facility] Attending or Consulting provider [place] or covering provider during after hours [place], for this patient?  Check  the care team in [facility] and look for a) attending/consulting [place] provider listed and b) the [facility] team listed. Page or secure chat [phone]. Log into [email or web address] and use [place]'s universal password to access. If you do not have the password, please contact the hospital operator. Locate the [facility] provider you are looking for under [facility] and page to a number that you can be directly reached. If you still have difficulty reaching the provider, please page the [facility] (Director on Call) for the Hospitalists listed on amion for assistance.

## 2022-02-24 NOTE — Progress Notes (Signed)
PHARMACIST - PHYSICIAN COMMUNICATION  DR:   Doristine Bosworth  CONCERNING: IV to Oral Route Change Policy  RECOMMENDATION: This patient is receiving Protonix and Azithro by the intravenous route.  Based on criteria approved by the Pharmacy and Therapeutics Committee, the intravenous medication(s) is/are being converted to the equivalent oral dose form(s).   DESCRIPTION: These criteria include: The patient is eating (either orally or via tube) and/or has been taking other orally administered medications for a least 24 hours The patient has no evidence of active gastrointestinal bleeding or impaired GI absorption (gastrectomy, short bowel, patient on TNA or NPO).  If you have questions about this conversion, please contact the Pharmacy Department  '[]'$   [phone] )  [facility] '[]'$   [phone] )  [facility] '[]'$   [phone] )  [facility] '[]'$   [phone] )  [facility] '[x]'$   [phone] )  [place], PharmD, [place]: [phone] 02/24/2022 10:43 AM

## 2022-02-25 DIAGNOSIS — J9621 Acute and chronic respiratory failure with hypoxia: Secondary | ICD-10-CM | POA: Diagnosis not present

## 2022-02-25 DIAGNOSIS — J9622 Acute and chronic respiratory failure with hypercapnia: Secondary | ICD-10-CM | POA: Diagnosis not present

## 2022-02-25 LAB — BASIC METABOLIC PANEL
Anion gap: 10 (ref 5–15)
BUN: 19 mg/dL (ref 8–23)
CO2: 32 mmol/L (ref 22–32)
Calcium: 8.6 mg/dL — ABNORMAL LOW (ref 8.9–10.3)
Chloride: 97 mmol/L — ABNORMAL LOW (ref 98–111)
Creatinine, Ser: 0.49 mg/dL (ref 0.44–1.00)
GFR, Estimated: >60 mL/min (ref >60)
Glucose, Bld: 123 mg/dL — ABNORMAL HIGH (ref 70–99)
Potassium: 3.7 mmol/L (ref 3.5–5.1)
Sodium: 139 mmol/L (ref 135–145)

## 2022-02-25 NOTE — Progress Notes (Signed)
PROGRESS NOTE    [name]  ZOX:[id number] DOB: [birth year] DOA: 02/20/2022 PCP: Tonia Ghent, MD   Brief Narrative:  [name] is a 88 y.o. female with PMH significant for COPD on 3 LPM O2, mild chronic systolic CHF (Echo 02/1190 EF 40-45%), CAD (STEMI 01/2019 with DES to RCA), right breast cancer (ER positive invasive ductal carcinoma on Anastrozole), gastroesophageal reflux disease, hyperlipidemia, anemia of chronic disease, hypothyroidism and mild dementia Patient was brought to the ED on 8/15 with worsening confusion. 8/13, patient fell at home while attempting to get out of bed.  She was found by daughter with her walker on top of her.  She complained of right-sided chest pain and was brought to the ED.  CT chest showed mildly displaced fracture of the posterior right ninth and 10th ribs, compression fracture of the T6 as well as anterior wedge compression fracture of T7.  Patient was treated for pain with fentanyl, Motrin, Tylenol.  She did not meet criteria for admission and hence was discharged to home. At home, she has been experiencing progressively worsening pain, agitation and confusion.  On 8/15, seen by PCP and sent to ED again. In the ED, patient was agitated and intermittently following commands.  Afebrile, hemodynamically stable. VBG showed compensated respiratory acidosis with significantly elevated PCO2 at 115 and bicarbonate at 59.  Baseline CO2 around 70. BiPAP was initiated Admitted to hospitalist service for further evaluation management.  Assessment & Plan:   Principal Problem:   Acute on chronic respiratory failure with hypoxia and hypercapnia (HCC) Active Problems:   COPD with acute exacerbation (HCC)   Aspiration pneumonia of right lower lobe (HCC)   Acute metabolic encephalopathy   Coronary artery disease involving native coronary artery of native heart without angina pectoris   Multiple fractures of ribs, right side, subsequent encounter  for fracture with routine healing   Elevated troponin level not due myocardial infarction   History of right breast cancer   Acquired hypothyroidism   Mixed hyperlipidemia   GERD without esophagitis   Dementia with behavioral disturbance (HCC)   Goals of care, counseling/discussion  Acute on chronic respiratory failure with hypoxia and hypercapnia  - Presented with progressively worsening confusion, agitation after 2 days of rib fractures.  Patient probably had limitation in lung expansion that worsened her respiratory failure precipitating significantly elevated CO2.  Required BiPAP.  - DNR/DNI per family, he has improved significantly, has not required BiPAP since 02/21/2022.  Currently on 3 L oxygen which is at baseline.   Aspiration pneumonia Acute exacerbation of COPD - CT chest on 8/13 showed developing right lower lobe infiltrate concerning for aspiration pneumonia. Currently on IV Rocephin, azithromycin (QTc 437 ms) and IV Solu-Medrol 60 mg twice daily.  Completed 5 days, will stop all of those.  -She was encouraged to use incentive spirometry . -Continue with bronchodilators. -PT consulted, encouraged to ambulate. -SLP consulted given history of dysphagia.    Right ninth and 10th rib fractures - After a fall on 8/13. - Conservative management with adequate pain control. - Currently on Tylenol and Lidoderm patch.  Pain is very well controlled.   Acute metabolic encephalopathy Underlying dementia - Acute agitation secondary to hypercapnia.  - PTA on Xanax as needed, Aricept at bedtime, Cymbalta 60 mg daily, BuSpar 5 mg twice daily trazodone 100 mg at bedtime - Currently continued on these.  Continue to monitor mental status   Mildly elevated troponin History of CAD HLD Chest pain likely due to  rib fractures.  Only mildly elevated troponin.   Continue Crestor.   Chronic systolic congestive heart failure: Echo done in 2020 shows 45% ejection fraction.  Currently  euvolemic.  Essential hypertension: Does not seem to be taking any medications for this PTA.  Has been started on Coreg during this hospitalization.  Blood pressure still significantly elevated.  Due to history of systolic CHF, I will start her on lisinopril 20 mg, she is already on as needed hydralazine.   History of right breast cancer Continue anastrozole   Acquired hypothyroidism Continue Synthroid    GERD  Continue PPI  Hypokalemia: Resolved.    Goals of care, counseling/discussion Confirmed DNR/DNI status on admission with daughter Prognosis guarded  Disposition: Patient is medically stable.  Family has been given bed offers from 2 different facilities but they do not like the facilities.  They are waiting for more offers.  Spoke to the daughter in detail on 02/24/2022.  She is requesting to provide her time until Monday because most of the facilities are closed over the weekend.   DVT prophylaxis: enoxaparin (LOVENOX) injection 40 mg Start: 02/20/22 2230   Code Status: DNR  Family Communication:  None present at bedside.  Discussed with daughter yesterday.  Status is: Inpatient Remains inpatient appropriate because: Waiting for family to pick A SNF choice.  Patient otherwise medically stable.   Estimated body mass index is 23.43 kg/m as calculated from the following:   Height as of an earlier encounter on 02/20/22: '5\' 1"'$  (1.549 m).   Weight as of an earlier encounter on 02/20/22: 56.2 kg.    Nutritional Assessment: There is no height or weight on file to calculate BMI.. Seen by dietician.  I agree with the assessment and plan as outlined below: Nutrition Status:        . Skin Assessment: I have examined the patient's skin and I agree with the wound assessment as performed by the wound care RN as outlined below:    Consultants:  None  Procedures:  None  Antimicrobials:  Anti-infectives (From admission, onward)    Start     Dose/Rate Route Frequency Ordered  Stop   02/24/22 2200  azithromycin (ZITHROMAX) tablet 500 mg  Status:  Discontinued        500 mg Oral Daily at bedtime 02/24/22 1043 02/25/22 1102   02/20/22 2230  cefTRIAXone (ROCEPHIN) 2 g in sodium chloride 0.9 % 100 mL IVPB  Status:  Discontinued        2 g 200 mL/hr over 30 Minutes Intravenous Every 24 hours 02/20/22 2131 02/25/22 1102   02/20/22 2230  azithromycin (ZITHROMAX) 500 mg in sodium chloride 0.9 % 250 mL IVPB  Status:  Discontinued        500 mg 250 mL/hr over 60 Minutes Intravenous Every 24 hours 02/20/22 2131 02/24/22 1043         Subjective:  Patient seen and examined.  Yet again today, she states " I am not feeling well" but when asked to specify or elaborate, she states " I do not know".  I think this is her baseline due to dementia.  She does appear stable.  Objective: Vitals:   02/24/22 0537 02/24/22 1332 02/24/22 2108 02/25/22 0545  BP: (!) 128/94 (!) 172/92 (!) 163/98 139/84  Pulse: 87 79 85 86  Resp: '16 20 18 18  '$ Temp: 97.9 F (36.6 C) 98.2 F (36.8 C) 98.4 F (36.9 C) 97.8 F (36.6 C)  TempSrc: Oral Oral Oral Oral  SpO2: 97% 99% 98% 96%    Intake/Output Summary (Last 24 hours) at 02/25/2022 1125 Last data filed at 02/25/2022 0500 Gross per 24 hour  Intake 100 ml  Output --  Net 100 ml    There were no vitals filed for this visit.  Examination:  General exam: Appears calm and comfortable  Respiratory system: Diminished breath sounds due to poor inspiratory effort.  Cardiovascular system: S1 & S2 heard, RRR. No JVD, murmurs, rubs, gallops or clicks. No pedal edema. Gastrointestinal system: Abdomen is nondistended, soft and nontender. No organomegaly or masses felt. Normal bowel sounds heard. Central nervous system: Alert and oriented. No focal neurological deficits. Extremities: Symmetric 5 x 5 power. Skin: No rashes, lesions or ulcers.  Psychiatry: Judgement and insight appear poor  Data Reviewed: I have personally reviewed following labs  and imaging studies  CBC: Recent Labs  Lab 02/19/22 0535 02/20/22 1600 02/21/22 0322 02/22/22 0239 02/23/22 0539  WBC 5.1 5.6 4.6 4.1 5.4  NEUTROABS  --  4.0 4.3 3.7  --   HGB 10.4* 10.9* 9.8* 9.3* 11.8*  HCT 34.8* 36.9 32.5* 29.6* 36.6  MCV 108.1* 109.2* 108.0* 102.8* 101.1*  PLT 146* 143* 132* 128* 664    Basic Metabolic Panel: Recent Labs  Lab 02/20/22 1600 02/21/22 0322 02/22/22 0239 02/23/22 0539 02/24/22 0831  NA 144 143 141 144 139  K 3.5 3.7 3.7 2.9* 3.8  CL 95* 97* 95* 98 97*  CO2 42* 37* 37* 36* 31  GLUCOSE 139* 149* 131* 119* 122*  BUN '16 17 15 13 17  '$ CREATININE 0.70 0.49 0.43* 0.51 0.52  CALCIUM 9.2 8.7* 8.4* 8.8* 8.8*  MG  --  1.7  --   --   --     GFR: Estimated Creatinine Clearance: 36 mL/min (by C-G formula based on SCr of 0.52 mg/dL). Liver Function Tests: Recent Labs  Lab 02/20/22 1600 02/21/22 0322  AST 27 25  ALT 12 10  ALKPHOS 45 37*  BILITOT 0.7 0.8  PROT 7.0 6.2*  ALBUMIN 3.6 3.0*    No results for input(s): "LIPASE", "AMYLASE" in the last 168 hours. No results for input(s): "AMMONIA" in the last 168 hours. Coagulation Profile: No results for input(s): "INR", "PROTIME" in the last 168 hours. Cardiac Enzymes: Recent Labs  Lab 02/19/22 0050  CKTOTAL 31*    BNP (last 3 results) No results for input(s): "PROBNP" in the last 8760 hours. HbA1C: No results for input(s): "HGBA1C" in the last 72 hours. CBG: Recent Labs  Lab 02/20/22 1711  GLUCAP 118*    Lipid Profile: No results for input(s): "CHOL", "HDL", "LDLCALC", "TRIG", "CHOLHDL", "LDLDIRECT" in the last 72 hours. Thyroid Function Tests: No results for input(s): "TSH", "T4TOTAL", "FREET4", "T3FREE", "THYROIDAB" in the last 72 hours. Anemia Panel: No results for input(s): "VITAMINB12", "FOLATE", "FERRITIN", "TIBC", "IRON", "RETICCTPCT" in the last 72 hours. Sepsis Labs: No results for input(s): "PROCALCITON", "LATICACIDVEN" in the last 168 hours.  Recent Results  (from the past 240 hour(s))  MRSA Next Gen by PCR, Nasal     Status: None   Collection Time: 02/20/22  7:48 PM   Specimen: Nasal Mucosa; Nasal Swab  Result Value Ref Range Status   MRSA by PCR Next Gen NOT DETECTED NOT DETECTED Final    Comment: (NOTE) The GeneXpert MRSA Assay (FDA approved for NASAL specimens only), is one component of a comprehensive MRSA colonization surveillance program. It is not intended to diagnose MRSA infection nor to guide or monitor treatment for MRSA  infections. Test performance is not FDA approved in patients less than 55 years old. Performed at [facility], [place] [address] [place], [place] [address]   Culture, blood (routine x 2) Call MD if unable to obtain prior to antibiotics being given     Status: None (Preliminary result)   Collection Time: 02/20/22 10:43 PM   Specimen: BLOOD  Result Value Ref Range Status   Specimen Description   Final    BLOOD BLOOD LEFT FOREARM Performed at [facility] [address] [place], [place] [address]    Special Requests   Final    BOTTLES DRAWN AEROBIC ONLY Blood Culture adequate volume Performed at [facility] [address], [place], [place] [address]    Culture   Final    NO GROWTH 4 DAYS Performed at [facility], [place] [address], [place], [place] [address]    Report Status PENDING  Incomplete  Culture, blood (routine x 2) Call MD if unable to obtain prior to antibiotics being given     Status: None (Preliminary result)   Collection Time: 02/20/22 10:43 PM   Specimen: BLOOD  Result Value Ref Range Status   Specimen Description   Final    BLOOD BLOOD LEFT HAND Performed at [facility] [address] [place], [place] [address]    Special Requests   Final    BOTTLES DRAWN AEROBIC ONLY Blood Culture results may not be optimal due to an inadequate volume of blood received in culture bottles Performed at [facility] [address] [place], [place] [address]    Culture   Final    NO GROWTH 4 DAYS Performed at [facility], [place] [address] [place], [place] [address]    Report Status PENDING  Incomplete     Radiology Studies: No results found.  Scheduled Meds:  acetaminophen  1,000 mg Oral Once   acetaminophen  650 mg Oral BID   anastrozole  1 mg Oral Daily   aspirin EC  81 mg Oral Daily   busPIRone  5 mg Oral BID   carvedilol  3.125 mg Oral BID WC   Chlorhexidine Gluconate Cloth  6 each Topical Q0600   donepezil  5 mg Oral QHS   DULoxetine  60 mg Oral Daily   enoxaparin (LOVENOX) injection  40 mg Subcutaneous Q24H   levothyroxine  112 mcg Oral Q0600   lidocaine  1 patch Transdermal Q24H   lisinopril  20 mg Oral Daily   methylPREDNISolone (SOLU-MEDROL) injection  60 mg Intravenous Q12H   mouth rinse  15 mL Mouth Rinse 4 times per day   pantoprazole  40 mg Oral Daily   rosuvastatin  10 mg Oral Daily   traZODone  100 mg Oral QHS   Continuous Infusions:     LOS: 5 days   Darliss Cheney, MD [facility]  02/25/2022, 11:25 AM   *Please note that this is a verbal dictation therefore any spelling or grammatical errors are due to the "[place] [employer]" system interpretation.  Please page via [place] and do not message via secure chat for urgent patient care matters. Secure chat can be used for non urgent patient care matters.  How to contact the [facility] Attending or Consulting provider [place] or covering provider during after hours [place], for this patient?  Check the care team in [facility] and look for a) attending/consulting [place] provider listed and b) the  [place] team listed. Page or secure chat [phone]. Log into [email or web address] and use [place]'s universal password to access. If you do not have the password, please contact the hospital operator. Locate the [facility] provider you are looking for under [facility] and page to a number that you can be directly  reached. If you still have difficulty reaching the provider, please page the [facility] (Director on Call) for the Hospitalists listed on amion for assistance.

## 2022-02-26 ENCOUNTER — Telehealth: Payer: Self-pay

## 2022-02-26 ENCOUNTER — Telehealth: Payer: Medicare PPO

## 2022-02-26 DIAGNOSIS — R41841 Cognitive communication deficit: Secondary | ICD-10-CM | POA: Diagnosis not present

## 2022-02-26 DIAGNOSIS — R2681 Unsteadiness on feet: Secondary | ICD-10-CM | POA: Diagnosis not present

## 2022-02-26 DIAGNOSIS — I10 Essential (primary) hypertension: Secondary | ICD-10-CM | POA: Diagnosis not present

## 2022-02-26 DIAGNOSIS — J9622 Acute and chronic respiratory failure with hypercapnia: Secondary | ICD-10-CM | POA: Diagnosis not present

## 2022-02-26 DIAGNOSIS — J441 Chronic obstructive pulmonary disease with (acute) exacerbation: Secondary | ICD-10-CM | POA: Diagnosis not present

## 2022-02-26 DIAGNOSIS — G9341 Metabolic encephalopathy: Secondary | ICD-10-CM | POA: Diagnosis not present

## 2022-02-26 DIAGNOSIS — R278 Other lack of coordination: Secondary | ICD-10-CM | POA: Diagnosis not present

## 2022-02-26 DIAGNOSIS — J449 Chronic obstructive pulmonary disease, unspecified: Secondary | ICD-10-CM | POA: Diagnosis not present

## 2022-02-26 DIAGNOSIS — N390 Urinary tract infection, site not specified: Secondary | ICD-10-CM | POA: Diagnosis not present

## 2022-02-26 DIAGNOSIS — J9621 Acute and chronic respiratory failure with hypoxia: Secondary | ICD-10-CM | POA: Diagnosis not present

## 2022-02-26 DIAGNOSIS — R0902 Hypoxemia: Secondary | ICD-10-CM | POA: Diagnosis not present

## 2022-02-26 DIAGNOSIS — R296 Repeated falls: Secondary | ICD-10-CM | POA: Diagnosis not present

## 2022-02-26 DIAGNOSIS — F0390 Unspecified dementia without behavioral disturbance: Secondary | ICD-10-CM | POA: Diagnosis not present

## 2022-02-26 DIAGNOSIS — M6281 Muscle weakness (generalized): Secondary | ICD-10-CM | POA: Diagnosis not present

## 2022-02-26 DIAGNOSIS — S2241XD Multiple fractures of ribs, right side, subsequent encounter for fracture with routine healing: Secondary | ICD-10-CM | POA: Diagnosis not present

## 2022-02-26 DIAGNOSIS — S22000A Wedge compression fracture of unspecified thoracic vertebra, initial encounter for closed fracture: Secondary | ICD-10-CM | POA: Diagnosis not present

## 2022-02-26 DIAGNOSIS — R2689 Other abnormalities of gait and mobility: Secondary | ICD-10-CM | POA: Diagnosis not present

## 2022-02-26 DIAGNOSIS — Z9181 History of falling: Secondary | ICD-10-CM | POA: Diagnosis not present

## 2022-02-26 DIAGNOSIS — Z7401 Bed confinement status: Secondary | ICD-10-CM | POA: Diagnosis not present

## 2022-02-26 DIAGNOSIS — S2241XA Multiple fractures of ribs, right side, initial encounter for closed fracture: Secondary | ICD-10-CM | POA: Diagnosis not present

## 2022-02-26 DIAGNOSIS — R531 Weakness: Secondary | ICD-10-CM | POA: Diagnosis not present

## 2022-02-26 DIAGNOSIS — J690 Pneumonitis due to inhalation of food and vomit: Secondary | ICD-10-CM | POA: Diagnosis not present

## 2022-02-26 DIAGNOSIS — I5022 Chronic systolic (congestive) heart failure: Secondary | ICD-10-CM | POA: Diagnosis not present

## 2022-02-26 LAB — CULTURE, BLOOD (ROUTINE X 2)
Culture: NO GROWTH
Culture: NO GROWTH
Special Requests: ADEQUATE

## 2022-02-26 MED ORDER — LISINOPRIL 20 MG PO TABS: 20.0000 mg | ORAL_TABLET | Freq: Every day | ORAL | 0 refills | Status: AC

## 2022-02-26 MED ORDER — CARVEDILOL 3.125 MG PO TABS
3.1250 mg | ORAL_TABLET | Freq: Two times a day (BID) | ORAL | 0 refills | Status: AC
Start: 2022-02-26 — End: 2022-03-28

## 2022-02-26 MED ORDER — ASPIRIN 81 MG PO TBEC: 81.0000 mg | DELAYED_RELEASE_TABLET | Freq: Every day | ORAL | 0 refills | Status: DC

## 2022-02-26 MED ORDER — ALPRAZOLAM 0.5 MG PO TABS: 0.5000 mg | ORAL_TABLET | Freq: Every day | ORAL | 0 refills | Status: DC | PRN

## 2022-02-26 NOTE — Progress Notes (Signed)
Physical Therapy Treatment Patient Details Name: [name] MRN: [id number] DOB: 03-10-1931 Today's Date: 02/26/2022   History of Present Illness [name] is a 88 y.o. female with PMH significant for COPD on 3 LPM O2, mild chronic systolic CHF -EF 27-06%), CAD STEM, right breast cancer, gastroesophageal reflux disease, hyperlipidemia, anemia of chronic disease, hypothyroidism and mild dementia  Patient was brought to the ED on 8/15 with worsening confusion, fall on 8/13, brought to the ED.  CT chest showed mildly displaced fracture of the posterior right ninth and 10th ribs, compression fracture of the T6 as well as anterior wedge compression fracture of T7.  Patient was treated and returned home. Progressively worsening pain, agitation and confusion, sent to ED by PCP on 8/15. On   Home O2.    PT Comments    The patient demonstrates improved strength and required less assistance for mobility. Patient  able to stand with min assistance and step to [facility] and then to recliner. Stood with 1 UE support for hygiene prior to moving to recliner.  Continue PT. Patient maintained on 3 L [employer]. No SOB noted during mobility.  Patient should be able to progress to ambulation next visit.    Recommendations for follow up therapy are one component of a multi-disciplinary discharge planning process, led by the attending physician.  Recommendations may be updated based on patient status, additional functional criteria and insurance authorization.  Follow Up Recommendations  Skilled nursing-short term rehab (<3 hours/day) Can patient physically be transported by private vehicle: No   Assistance Recommended at Discharge Frequent or constant Supervision/Assistance  Patient can return home with the following A lot of help with walking and/or transfers;A little help with bathing/dressing/bathroom;Help with stairs or ramp for entrance;Assistance with cooking/housework;Assist for transportation    Equipment Recommendations  None recommended by PT    Recommendations for Other Services       Precautions / Restrictions Precautions Precautions: Fall Precaution Comments: monitor sats Restrictions Weight Bearing Restrictions: No     Mobility  Bed Mobility Overal bed mobility: Needs Assistance Bed Mobility: Supine to Sit     Supine to sit: Min guard, HOB elevated     General bed mobility comments: multimodal cues, extra time    Transfers Overall transfer level: Needs assistance Equipment used: 1 person hand held assist Transfers: Sit to/from Stand, Bed to chair/wheelchair/BSC Sit to Stand: Min assist   Step pivot transfers: Min assist       General transfer comment: Multimodal cues for activity to stnad and pivot to [facility] then to reliner. Patient held onmto  bed rail during pericare after toileting.    Ambulation/Gait                   Stairs             Wheelchair Mobility    Modified Rankin (Stroke Patients Only)       Balance   Sitting-balance support: Bilateral upper extremity supported, Feet supported Sitting balance-Leahy Scale: Fair     Standing balance support: Single extremity supported Standing balance-Leahy Scale: Fair Standing balance comment: reliant on support                            Cognition Arousal/Alertness: Awake/alert Behavior During Therapy: WFL for tasks assessed/performed Overall Cognitive Status: History of cognitive impairments - at baseline  General Comments: R ear best to communicate        Exercises      General Comments        Pertinent Vitals/Pain Pain Assessment Faces Pain Scale: No hurt    Home Living                          Prior Function            PT Goals (current goals can now be found in the care plan section) Progress towards PT goals: Progressing toward goals    Frequency    Min 2X/week      PT  Plan Current plan remains appropriate    Co-evaluation              AM-PAC PT "6 Clicks" Mobility   Outcome Measure  Help needed turning from your back to your side while in a flat bed without using bedrails?: A Little Help needed moving from lying on your back to sitting on the side of a flat bed without using bedrails?: A Little Help needed moving to and from a bed to a chair (including a wheelchair)?: A Little Help needed standing up from a chair using your arms (e.g., wheelchair or bedside chair)?: A Little Help needed to walk in hospital room?: Total Help needed climbing 3-5 steps with a railing? : Total 6 Click Score: 14    End of Session Equipment Utilized During Treatment: Gait belt Activity Tolerance: Patient tolerated treatment well Patient left: in chair;with call bell/phone within reach;with chair alarm set Nurse Communication: Mobility status PT Visit Diagnosis: Unsteadiness on feet (R26.81);History of falling (Z91.81);Repeated falls (R29.6);Difficulty in walking, not elsewhere classified (R26.2)     Time: 0900-0930 PT Time Calculation (min) (ACUTE ONLY): 30 min  Charges:  $Therapeutic Activity: 8-22 mins $Self Care/Home Management: [place] Office [phone] Weekend pager-[phone]    Claretha Cooper 02/26/2022, 11:28 AM

## 2022-02-26 NOTE — TOC Transition Note (Signed)
Transition of Care [facility]) - CM/SW Discharge Note   Patient Details  Name: [name] MRN: [id number] Date of Birth: 21-Jun-1931  [facility] CM/SW Contact:  Vassie Moselle, LCSW Phone Number: 02/26/2022, 4:04 PM   Clinical Narrative:    Pt's insurance authorization was approved starting 8/21-8/23. Auth ID: [id number]. Pt is able to [facility] to [facility] for SNF. Pt will be going to room 102. RN to call report to [phone]. Pt will be transported via [facility].    Final next level of care: Skilled Nursing Facility Barriers to Discharge: Barriers Resolved   Patient Goals and CMS Choice Patient states their goals for this hospitalization and ongoing recovery are:: Rehab CMS Medicare.gov Compare Post Acute Care list provided to:: Patient Represenative (must comment) Choice offered to / list presented to : Adult Children  Discharge Placement   Existing PASRR number confirmed : 02/26/22          Patient chooses bed at: [facility] Patient to be transferred to facility by: [facility] Name of family member notified: Tresa Res Patient and family notified of of transfer: 02/26/22  Discharge Plan and Services   Discharge Planning Services: CM Consult Post Acute Care Choice: [place]                               Social Determinants of Health (SDOH) Interventions     Readmission Risk Interventions    02/21/2022   11:13 AM 12/13/2021    1:29 PM  Readmission Risk Prevention Plan  Transportation Screening Complete   Medication Review (RN Care Manager) Complete   PCP or Specialist appointment within 3-5 days of discharge Complete Complete  HRI or Home Care Consult Complete Complete  Palliative Care Screening Not Applicable Not Applicable  Skilled Nursing Facility Complete Complete

## 2022-02-26 NOTE — Consult Note (Signed)
   [facility] CM Inpatient Consult   02/26/2022  Grey Schlauch 10/22/1930 [id number]   [place] Organization [ACO] Patient:  [name] Liaison remote coverage review at [name]   Primary Care Provider: Tonia [place], MD, [place] is an Independent Embedded provider with a Care Management team and program and is listed for the [facility] follow up needs     Patient screened for extreme high risk score for unplanned readmission risk.  Reviewed to assess for potential [place] Management service needs for post [name] transition for readmission prevention needs.  Review of patient's medical record reveals patient is active with Embedded team prior to admission. Patient is currently for a skilled nursing facility level of care for transition.     Plan:  Needs are to be met a a skilled facility level of care.  Will let team know of transition for ST SNF.   For questions contact:    Natividad Brood, RN BSN [place] [name] Liaison  [phone] business mobile phone Toll free office [phone]  Fax number: [phone] [email or web address]

## 2022-02-26 NOTE — Discharge Summary (Signed)
PatientPhysician Discharge Summary  [name] LZJ:[id number] DOB: 1931/01/14 DOA: 02/20/2022  PCP: Tonia Ghent, MD  Admit date: 02/20/2022 Discharge date: 02/26/2022 30 Day Unplanned Readmission Risk Score    Flowsheet Row ED to Hosp-Admission (Current) from 02/20/2022 in [facility]  30 Day Unplanned Readmission Risk Score (%) 47.05 Filed at 02/26/2022 1200       This score is the patient's risk of an unplanned readmission within 30 days of being discharged (0 -100%). The score is based on dignosis, age, lab data, medications, orders, and past utilization.   Low:  0-14.9   Medium: 15-21.9   High: 22-29.9   Extreme: 30 and above          Admitted From: Home Disposition: SNF  Recommendations for Outpatient Follow-up:  Follow up with PCP in 1-2 weeks Please obtain BMP/CBC in one week Please follow up with your PCP on the following pending results: Unresulted Labs (From admission, onward)    None         Home Health: None Equipment/Devices: None  Discharge Condition: Stable CODE STATUS: DNR Diet recommendation: Cardiac  Subjective: Seen and examined, no new complaint.  Brief/Interim Summary: [name] is a 88 y.o. female with PMH significant for COPD on 3 LPM O2, mild chronic systolic CHF (Echo 0/2409 EF 40-45%), CAD (STEMI 01/2019 with DES to RCA), right breast cancer (ER positive invasive ductal carcinoma on Anastrozole), gastroesophageal reflux disease, hyperlipidemia, anemia of chronic disease, hypothyroidism and mild dementia Patient was brought to the ED on 8/15 with worsening confusion. 8/13, patient fell at home while attempting to get out of bed.  She was found by daughter with her walker on top of her.  She complained of right-sided chest pain and was brought to the ED.  CT chest showed mildly displaced fracture of the posterior right ninth and 10th ribs, compression fracture of the T6 as well as  anterior wedge compression fracture of T7.  Patient was treated for pain with fentanyl, Motrin, Tylenol.  She did not meet criteria for admission and hence was discharged to home. At home, she had been experiencing progressively worsening pain, agitation and confusion.  On 8/15, seen by PCP and sent to ED again. In the ED, patient was agitated and intermittently following commands.  Afebrile, hemodynamically stable. VBG showed compensated respiratory acidosis with significantly elevated PCO2 at 115 and bicarbonate at 59.  Baseline CO2 around 70. BiPAP was initiated Admitted to hospitalist service for further evaluation management.  Details below.   Acute on chronic respiratory failure with hypoxia and hypercapnia  - Presented with progressively worsening confusion, agitation after 2 days of rib fractures.  Patient probably had limitation in lung expansion that worsened her respiratory failure precipitating significantly elevated CO2.  Required BiPAP.  - DNR/DNI per family, he has improved significantly, has not required BiPAP since 02/21/2022 and has been on 3 L oxygen which is at baseline.   Aspiration pneumonia Acute exacerbation of COPD - CT chest on 8/13 showed developing right lower lobe infiltrate concerning for aspiration pneumonia. Completed 5 days of IV Solu-Medrol, Rocephin and Zithromax on 02/25/2022. -She was encouraged to use incentive spirometry . -Continue with bronchodilators. -PT consulted, encouraged to ambulate. -SLP consulted given history of dysphagia.    Right ninth and 10th rib fractures - After a fall on 8/13. - Conservative management with adequate pain control. - Currently on Tylenol and Lidoderm patch.  Pain is very well controlled.  Acute metabolic encephalopathy Underlying dementia - Acute agitation secondary to hypercapnia.  - PTA on Xanax as needed, Aricept at bedtime, Cymbalta 60 mg daily, BuSpar 5 mg twice daily trazodone 100 mg at bedtime - Currently  continued on these.  Continue to monitor mental status   Mildly elevated troponin History of CAD HLD Chest pain likely due to rib fractures.  Only mildly elevated troponin.   Continue Crestor.    Chronic systolic congestive heart failure: Echo done in 2020 shows 45% ejection fraction.  Currently euvolemic.   Essential hypertension: Does not seem to be taking any medications for this PTA.  Has been started on Coreg and lisinopril during this hospitalization.  Blood pressure now controlled.   History of right breast cancer Continue anastrozole   Acquired hypothyroidism Continue Synthroid    GERD  Continue PPI   Hypokalemia: Resolved.    Goals of care, counseling/discussion Confirmed DNR/DNI status on admission with daughter Prognosis guarded  Generalized weakness: PT OT evaluated the patient and recommended SNF.  Patient is going to be discharged to SNF in stable condition today.  Discharge plan was discussed with patient and/or family member and they verbalized understanding and agreed with it.  Discharge Diagnoses:  Principal Problem:   Acute on chronic respiratory failure with hypoxia and hypercapnia (HCC) Active Problems:   COPD with acute exacerbation (HCC)   Aspiration pneumonia of right lower lobe (HCC)   Acute metabolic encephalopathy   Coronary artery disease involving native coronary artery of native heart without angina pectoris   Multiple fractures of ribs, right side, subsequent encounter for fracture with routine healing   Elevated troponin level not due myocardial infarction   History of right breast cancer   Acquired hypothyroidism   Mixed hyperlipidemia   GERD without esophagitis   Dementia with behavioral disturbance ([place])   Goals of care, counseling/discussion    Discharge Instructions   Allergies as of 02/26/2022       Reactions   Doxycycline Other (See Comments)   GI upset   Sulfadiazine Other (See Comments)   Fever, aches   Sulfa  Antibiotics Rash        Medication List     STOP taking these medications    methocarbamol 500 MG tablet Commonly known as: ROBAXIN       TAKE these medications    acetaminophen 500 MG tablet Commonly known as: TYLENOL Take 1,000 mg by mouth every 6 (six) hours as needed for mild pain or headache.   albuterol 108 (90 Base) MCG/ACT inhaler Commonly known as: ProAir HFA Inhale 1-2 puffs into the lungs every 6 (six) hours as needed for wheezing or shortness of breath (Okay to fill with Ventolin or albuterol HFA).   alendronate 70 MG tablet Commonly known as: FOSAMAX Take 1 tablet (70 mg total) by mouth every Monday.   ALPRAZolam 0.5 MG tablet Commonly known as: XANAX Take 1 tablet (0.5 mg total) by mouth daily as needed for anxiety or sleep.   anastrozole 1 MG tablet Commonly known as: ARIMIDEX Take 1 tablet (1 mg total) by mouth daily.   aspirin EC 81 MG tablet Take 1 tablet (81 mg total) by mouth daily. Swallow whole. Start taking on: February 27, 2022   busPIRone 5 MG tablet Commonly known as: BUSPAR TAKE ONE TABLET BY MOUTH IN THE MORNING AND IN THE EVENING What changed: See the new instructions.   CALTRATE 600 PO Take 600 mg by mouth at bedtime.   carvedilol 3.125 MG tablet  Commonly known as: COREG Take 1 tablet (3.125 mg total) by mouth 2 (two) times daily with a meal.   cholecalciferol 25 MCG (1000 UNIT) tablet Commonly known as: VITAMIN D3 Take 1,000 Units by mouth at bedtime.   donepezil 5 MG tablet Commonly known as: ARICEPT TAKE ONE TABLET BY MOUTH AT BEDTIME   DULoxetine 60 MG capsule Commonly known as: CYMBALTA TAKE ONE CAPSULE BY MOUTH DAILY   Ensure Take 237 mLs by mouth 2 (two) times daily between meals.   ferrous sulfate 325 (65 FE) MG tablet Commonly known as: FerrouSul Take 1 tablet (325 mg total) by mouth daily with breakfast.   fluticasone 50 MCG/ACT nasal spray Commonly known as: FLONASE PLACE 2 SPRAYS INTO BOTH NOSTRILS  DAILY. What changed:  when to take this reasons to take this   ipratropium-albuterol 0.5-2.5 (3) MG/3ML Soln Commonly known as: DUONEB Take 3 mLs by nebulization 2 (two) times daily.   levothyroxine 112 MCG tablet Commonly known as: SYNTHROID TAKE ONE TABLET BY MOUTH DAILY What changed:  how much to take how to take this when to take this additional instructions   lidocaine 5 % Commonly known as: Lidoderm Place 1 patch onto the skin daily as needed. Remove & Discard patch within 12 hours or as directed by MD   lisinopril 20 MG tablet Commonly known as: ZESTRIL Take 1 tablet (20 mg total) by mouth daily. Start taking on: February 27, 2022   multivitamin with minerals Tabs tablet Take 1 tablet by mouth daily.   nitroGLYCERIN 0.4 MG SL tablet Commonly known as: NITROSTAT Place 1 tablet (0.4 mg total) under the tongue every 5 (five) minutes as needed for chest pain.   OCUVITE PRESERVISION PO Take 1 tablet by mouth at bedtime.   OXYGEN Inhale 3-3.5 L into the lungs continuous.   pantoprazole 20 MG tablet Commonly known as: PROTONIX TAKE ONE TABLET BY MOUTH TWICE DAILY   rosuvastatin 10 MG tablet Commonly known as: CRESTOR Take 1 tablet (10 mg total) by mouth daily.   senna-docusate 8.6-50 MG tablet Commonly known as: Senokot-S Take 1 tablet by mouth at bedtime.   Spiriva HandiHaler 18 MCG inhalation capsule Generic drug: tiotropium INHALE THE CONTENTS OF ONE CAPSULE VIA HANDIHALER DAILY What changed: See the new instructions.   traZODone 50 MG tablet Commonly known as: DESYREL Take 2 tablets (100 mg total) by mouth at bedtime.        Contact information for follow-up providers     Tonia Ghent, MD Follow up in 1 week(s).   Specialty: Family Medicine Contact information: [place] [address] [phone]         Belva Crome, MD .   Specialty: Cardiology Contact information: [phone] [address] [place] [address] [phone]              Contact information for after-discharge care     Destination     [facility] .   Service: Skilled Nursing Contact information: [place] [address] [phone]                    Allergies  Allergen Reactions   Doxycycline Other (See Comments)    GI upset   Sulfadiazine Other (See Comments)    Fever, aches   Sulfa Antibiotics Rash    Consultations: None   Procedures/Studies: DG Chest Portable 1 View  Result Date: 02/20/2022 CLINICAL DATA:  Hypercapnia rib fractures on the RIGHT. EXAM: PORTABLE CHEST 1 VIEW COMPARISON:  Chest CT 02/19/2022 FINDINGS: Normal cardiac silhouette. No effusion or pneumothorax. No rib fracture identified plain film radiography. RIGHT shoulder arthroplasty IMPRESSION: No acute cardiopulmonary process. Electronically Signed   By: Suzy Bouchard M.D.   On: 02/20/2022 19:07   CT Head Wo Contrast  Result Date: 02/19/2022 CLINICAL DATA:  Unwitnessed fall, possible head trauma, complains of pain in the right ribcage. History of right breast cancer. EXAM: CT HEAD WITHOUT CONTRAST CT CERVICAL SPINE WITHOUT CONTRAST CT CHEST, ABDOMEN AND PELVIS WITH CONTRAST TECHNIQUE: Contiguous axial images were obtained from the base of the skull through the vertex without intravenous contrast. Multidetector CT imaging of the cervical spine was performed without intravenous contrast. Multiplanar CT image reconstructions were also generated. Multidetector CT imaging of the chest, abdomen and pelvis was performed following the standard protocol during bolus administration of intravenous contrast. RADIATION DOSE REDUCTION: This exam was performed according to the departmental dose-optimization program which includes automated exposure control, adjustment of the mA and/or kV according to patient size and/or use of iterative reconstruction technique. CONTRAST:   157m OMNIPAQUE IOHEXOL 300 MG/ML  SOLN COMPARISON:  PA and lateral chest and CTA chest, abdomen and pelvis 12/06/2021, chest CT with contrast 09/14/2019, head CT 12/06/2021, and cervical spine CT 12/06/2021. FINDINGS: CT HEAD FINDINGS Brain: There is moderately advanced global atrophy with atrophic ventriculomegaly, moderate to severe small vessel disease of the cerebral white matter, and stable appearance of bifrontal subdural hygromas. There are small chronic lacunar infarctions in both gangliocapsular areas. No asymmetry is seen concerning for an acute infarct, hemorrhage or mass. Chronic lacunar infarct in the superior left cerebellar hemisphere is also redemonstrated. Vascular: There is carotid atherosclerosis without hyperdense central vessels. Skull: Osteopenia. Negative for fracture or focal lesion. No visible scalp hematoma. Sinuses/Orbits: Fluid is again noted posteriorly in the left sphenoid air cell. There is increased patchy opacification of the ethmoid air cells. The frontal and visualized maxillary sinuses are clear. There is moderate patchy fluid increased throughout the right mastoid air cells with no right middle ear opacity. There has developed extensive fluid throughout the left mastoid air cells and middle ear cavity. No bone destruction is seen. Old lens replacements. Other: None. CT CERVICAL FINDINGS Alignment: Slight dextroscoliosis.  Otherwise normal. Skull base and vertebrae: Osteopenia. No fracture or primary bone lesion is seen. Soft tissues and spinal canal: No prevertebral fluid or swelling. No visible canal hematoma. There is bilateral carotid atherosclerosis. Disc levels: Moderate disc space loss again at C4-5 and C5-6. Trace marginal osteophytes. No significant soft tissue or bony encroachment on the thecal sac is seen. There is a mild nonstenosing posterior disc bulge again noted at C2-3. Facet joint and uncinate hypertrophy left-greater-than-right is seen at most levels, with  multilevel left-sided foraminal stenosis greatest at C4-5 and C5-6. There is mild narrowing and spurring at the anterior atlantodental joint. Other:  None. CT CHEST FINDINGS Cardiovascular: Slightly dilated ascending aorta again measures 4.2 cm. There is diffuse aortic atherosclerosis with descending segment tortuosity, stable arch segment measurement 3.8 cm and ulcerative atheromatous plaque in the anterior aspect of the proximal arch, as before. The great vessels show scattered calcification without stenosis or aneurysm. There is no aortic or great vessel dissection. There is mild cardiomegaly with three-vessel calcific CAD. Pulmonary arteries are normal caliber and centrally clear. Pulmonary veins are decompressed. Mediastinum/Nodes: There is no intrathoracic adenopathy or focal tracheal disease. There is no esophageal thickening. Thyroid gland  is small and atrophic. There is right axillary adenopathy with largest lymph node measuring 2.3 x 1.4 cm on 1:14. There are 2 adjacent mildly prominent left axillary lymph nodes not seen previously. Largest of these is 1.6 x 1.1 cm on 1:15. Clinical correlation advised. These lymph nodes were not enlarged on 09/14/2019 and left axillary adenopathy is new since. Lungs/Pleura: Emphysematous and scarring changes are again shown. There is chronic atelectasis or scarring in the posterolateral right lower lobe superimposed on this there is more confluent increased opacity in the area which could be increased regional atelectasis or small pneumonia. Diffuse central bronchial thickening is seen as well as increased amount of scattered small posterior basal bronchiolar impactions. There are increased posterior basal left lower lobe tree-in-bud interstitial changes consistent with small airways disease/bronchiolitis. Findings could be due to interval chronic aspiration. Remaining lungs are generally clear. Musculoskeletal: Osteopenia, levoscoliosis and degenerative changes of the  thoracic spine are again noted. There is again noted mild chronic anterior wedging of the T3 and T11 vertebral bodies. Today there is new demonstration of a mild upper plate anterior wedge compression fracture of the T6 vertebral body with 15% anterior and 5% posterior height loss without retropulsion, and a severe compression fracture of T7 body with posterosuperior cortical retropulsion up to 4 mm mildly encroaching on the ventral cord surface and narrowing the AP diameter of the thecal sac to 8.5 mm, with moderate to severe bilateral T7-8 foraminal stenosis secondarily. There is no displaced fracture in the visualized shoulder girdles, spinal posterior elements of the sternum. There are mildly displaced fractures of the right posterolateral ninth and tenth ribs. No other displaced rib fracture is seen. CT ABDOMEN PELVIS FINDINGS Hepatobiliary: No hepatic injury or perihepatic hematoma. Gallbladder is unremarkable Pancreas: Atrophic and otherwise unremarkable. Spleen: No splenic injury or perisplenic hematoma. Adrenals/Urinary Tract: No adrenal or renal mass enhancement. There is a small cyst in the posteroinferior left kidney. There is no urinary stone or obstruction. No thickening of the bladder wall. Stomach/Bowel: No dilatation or wall thickening. An appendix is not seen. Vascular/Lymphatic: Extensive aortoiliac calcific plaque noted, fusiform dilatation in the infrarenal aorta up to 4.5 cm which was seen previously, unchanged. No adjacent fat stranding is evident or other evidence of impending rupture. The portal vein is patent. No adenopathy is seen. Reproductive: Massive fibroid uterus again noted: Today's exam measuring 15.4 x 14.3 by 13.5 cm. Other: There are mild mesenteric and body wall congestive features which were noted previously. There is trace ascites in the posterior pelvis which was also seen previously. There is no free air, free hemorrhage or abscess. Musculoskeletal: Osteopenia,  dextroscoliosis and degenerative change lumbar spine. No visible regional skeletal fracture or compression injury. IMPRESSION: 1. No acute intracranial CT findings or depressed skull fractures. 2. Chronic atrophy and small-vessel changes with stable bifrontal subdural hygromas. 3. Interval worsening right mastoid effusion with extensive diffuse opacification of left mastoid air cells and left middle ear cavity. Correlate clinically for otomastoiditis. 4. Worsening sinus disease in the ethmoids with continued fluid in the sphenoid sinus. 5. Osteopenia and degenerative change without appreciable cervical fracture. 6. Increased opacity in the right lower lobe which could be due to interval increased atelectasis, pulmonary contusions or infiltrates. 7. Increased scattered small posterior lower lobe bronchiolar impactions and increasing left lower lobe tree-in-bud interstitial change consistent with small airways disease. Consider aspiration precautions. 8. Mildly displaced fractures of the posterolateral right ninth ribs with acute appearance. 9. Mild upper plate anterior wedge compression fracture  of T6 vertebral body new from 12/06/2021, possibly acute. 10. Severe anterior wedge compression fracture of the T7 vertebral body with 4 mm retropulsion encroaching somewhat on the ventral cord surface with mild spinal stenosis and with biforaminal stenosis at T7-8. This could be acute or subacute but it was not seen on 12/06/2021. There is only slight thickening of the paraspinal soft tissues at this level compared to the previous exam. 11. Right axillary adenopathy again noted with interval development of mildly enlarged left axillary lymph nodes. Clinical correlation for lymphoproliferative disease or recurrent right breast cancer versus infectious process recommended. 12. Cardiomegaly with aortic and coronary artery atherosclerosis, 4.2 cm stable dilatation of the ascending aorta, and up to 4.5 cm diffuse aneurysmal  dilatation in the abdominal aorta. Recommend imaging follow-up at six-month intervals and vascular consultation if not already done. 13. Again noted atheromatous ulcerative plaque anteriorly in the proximal ascending aorta. No dissection. 14. Massive fibroid uterus. Electronically Signed   By: Telford Nab M.D.   On: 02/19/2022 04:18   CT Cervical Spine Wo Contrast  Result Date: 02/19/2022 CLINICAL DATA:  Unwitnessed fall, possible head trauma, complains of pain in the right ribcage. History of right breast cancer. EXAM: CT HEAD WITHOUT CONTRAST CT CERVICAL SPINE WITHOUT CONTRAST CT CHEST, ABDOMEN AND PELVIS WITH CONTRAST TECHNIQUE: Contiguous axial images were obtained from the base of the skull through the vertex without intravenous contrast. Multidetector CT imaging of the cervical spine was performed without intravenous contrast. Multiplanar CT image reconstructions were also generated. Multidetector CT imaging of the chest, abdomen and pelvis was performed following the standard protocol during bolus administration of intravenous contrast. RADIATION DOSE REDUCTION: This exam was performed according to the departmental dose-optimization program which includes automated exposure control, adjustment of the mA and/or kV according to patient size and/or use of iterative reconstruction technique. CONTRAST:  15m OMNIPAQUE IOHEXOL 300 MG/ML  SOLN COMPARISON:  PA and lateral chest and CTA chest, abdomen and pelvis 12/06/2021, chest CT with contrast 09/14/2019, head CT 12/06/2021, and cervical spine CT 12/06/2021. FINDINGS: CT HEAD FINDINGS Brain: There is moderately advanced global atrophy with atrophic ventriculomegaly, moderate to severe small vessel disease of the cerebral white matter, and stable appearance of bifrontal subdural hygromas. There are small chronic lacunar infarctions in both gangliocapsular areas. No asymmetry is seen concerning for an acute infarct, hemorrhage or mass. Chronic lacunar infarct  in the superior left cerebellar hemisphere is also redemonstrated. Vascular: There is carotid atherosclerosis without hyperdense central vessels. Skull: Osteopenia. Negative for fracture or focal lesion. No visible scalp hematoma. Sinuses/Orbits: Fluid is again noted posteriorly in the left sphenoid air cell. There is increased patchy opacification of the ethmoid air cells. The frontal and visualized maxillary sinuses are clear. There is moderate patchy fluid increased throughout the right mastoid air cells with no right middle ear opacity. There has developed extensive fluid throughout the left mastoid air cells and middle ear cavity. No bone destruction is seen. Old lens replacements. Other: None. CT CERVICAL FINDINGS Alignment: Slight dextroscoliosis.  Otherwise normal. Skull base and vertebrae: Osteopenia. No fracture or primary bone lesion is seen. Soft tissues and spinal canal: No prevertebral fluid or swelling. No visible canal hematoma. There is bilateral carotid atherosclerosis. Disc levels: Moderate disc space loss again at C4-5 and C5-6. Trace marginal osteophytes. No significant soft tissue or bony encroachment on the thecal sac is seen. There is a mild nonstenosing posterior disc bulge again noted at C2-3. Facet joint and uncinate hypertrophy left-greater-than-right is seen  at most levels, with multilevel left-sided foraminal stenosis greatest at C4-5 and C5-6. There is mild narrowing and spurring at the anterior atlantodental joint. Other:  None. CT CHEST FINDINGS Cardiovascular: Slightly dilated ascending aorta again measures 4.2 cm. There is diffuse aortic atherosclerosis with descending segment tortuosity, stable arch segment measurement 3.8 cm and ulcerative atheromatous plaque in the anterior aspect of the proximal arch, as before. The great vessels show scattered calcification without stenosis or aneurysm. There is no aortic or great vessel dissection. There is mild cardiomegaly with three-vessel  calcific CAD. Pulmonary arteries are normal caliber and centrally clear. Pulmonary veins are decompressed. Mediastinum/Nodes: There is no intrathoracic adenopathy or focal tracheal disease. There is no esophageal thickening. Thyroid gland is small and atrophic. There is right axillary adenopathy with largest lymph node measuring 2.3 x 1.4 cm on 1:14. There are 2 adjacent mildly prominent left axillary lymph nodes not seen previously. Largest of these is 1.6 x 1.1 cm on 1:15. Clinical correlation advised. These lymph nodes were not enlarged on 09/14/2019 and left axillary adenopathy is new since. Lungs/Pleura: Emphysematous and scarring changes are again shown. There is chronic atelectasis or scarring in the posterolateral right lower lobe superimposed on this there is more confluent increased opacity in the area which could be increased regional atelectasis or small pneumonia. Diffuse central bronchial thickening is seen as well as increased amount of scattered small posterior basal bronchiolar impactions. There are increased posterior basal left lower lobe tree-in-bud interstitial changes consistent with small airways disease/bronchiolitis. Findings could be due to interval chronic aspiration. Remaining lungs are generally clear. Musculoskeletal: Osteopenia, levoscoliosis and degenerative changes of the thoracic spine are again noted. There is again noted mild chronic anterior wedging of the T3 and T11 vertebral bodies. Today there is new demonstration of a mild upper plate anterior wedge compression fracture of the T6 vertebral body with 15% anterior and 5% posterior height loss without retropulsion, and a severe compression fracture of T7 body with posterosuperior cortical retropulsion up to 4 mm mildly encroaching on the ventral cord surface and narrowing the AP diameter of the thecal sac to 8.5 mm, with moderate to severe bilateral T7-8 foraminal stenosis secondarily. There is no displaced fracture in the  visualized shoulder girdles, spinal posterior elements of the sternum. There are mildly displaced fractures of the right posterolateral ninth and tenth ribs. No other displaced rib fracture is seen. CT ABDOMEN PELVIS FINDINGS Hepatobiliary: No hepatic injury or perihepatic hematoma. Gallbladder is unremarkable Pancreas: Atrophic and otherwise unremarkable. Spleen: No splenic injury or perisplenic hematoma. Adrenals/Urinary Tract: No adrenal or renal mass enhancement. There is a small cyst in the posteroinferior left kidney. There is no urinary stone or obstruction. No thickening of the bladder wall. Stomach/Bowel: No dilatation or wall thickening. An appendix is not seen. Vascular/Lymphatic: Extensive aortoiliac calcific plaque noted, fusiform dilatation in the infrarenal aorta up to 4.5 cm which was seen previously, unchanged. No adjacent fat stranding is evident or other evidence of impending rupture. The portal vein is patent. No adenopathy is seen. Reproductive: Massive fibroid uterus again noted: Today's exam measuring 15.4 x 14.3 by 13.5 cm. Other: There are mild mesenteric and body wall congestive features which were noted previously. There is trace ascites in the posterior pelvis which was also seen previously. There is no free air, free hemorrhage or abscess. Musculoskeletal: Osteopenia, dextroscoliosis and degenerative change lumbar spine. No visible regional skeletal fracture or compression injury. IMPRESSION: 1. No acute intracranial CT findings or depressed skull fractures. 2.  Chronic atrophy and small-vessel changes with stable bifrontal subdural hygromas. 3. Interval worsening right mastoid effusion with extensive diffuse opacification of left mastoid air cells and left middle ear cavity. Correlate clinically for otomastoiditis. 4. Worsening sinus disease in the ethmoids with continued fluid in the sphenoid sinus. 5. Osteopenia and degenerative change without appreciable cervical fracture. 6.  Increased opacity in the right lower lobe which could be due to interval increased atelectasis, pulmonary contusions or infiltrates. 7. Increased scattered small posterior lower lobe bronchiolar impactions and increasing left lower lobe tree-in-bud interstitial change consistent with small airways disease. Consider aspiration precautions. 8. Mildly displaced fractures of the posterolateral right ninth ribs with acute appearance. 9. Mild upper plate anterior wedge compression fracture of T6 vertebral body new from 12/06/2021, possibly acute. 10. Severe anterior wedge compression fracture of the T7 vertebral body with 4 mm retropulsion encroaching somewhat on the ventral cord surface with mild spinal stenosis and with biforaminal stenosis at T7-8. This could be acute or subacute but it was not seen on 12/06/2021. There is only slight thickening of the paraspinal soft tissues at this level compared to the previous exam. 11. Right axillary adenopathy again noted with interval development of mildly enlarged left axillary lymph nodes. Clinical correlation for lymphoproliferative disease or recurrent right breast cancer versus infectious process recommended. 12. Cardiomegaly with aortic and coronary artery atherosclerosis, 4.2 cm stable dilatation of the ascending aorta, and up to 4.5 cm diffuse aneurysmal dilatation in the abdominal aorta. Recommend imaging follow-up at six-month intervals and vascular consultation if not already done. 13. Again noted atheromatous ulcerative plaque anteriorly in the proximal ascending aorta. No dissection. 14. Massive fibroid uterus. Electronically Signed   By: Telford Nab M.D.   On: 02/19/2022 04:18   CT CHEST ABDOMEN PELVIS W CONTRAST  Result Date: 02/19/2022 CLINICAL DATA:  Unwitnessed fall, possible head trauma, complains of pain in the right ribcage. History of right breast cancer. EXAM: CT HEAD WITHOUT CONTRAST CT CERVICAL SPINE WITHOUT CONTRAST CT CHEST, ABDOMEN AND PELVIS  WITH CONTRAST TECHNIQUE: Contiguous axial images were obtained from the base of the skull through the vertex without intravenous contrast. Multidetector CT imaging of the cervical spine was performed without intravenous contrast. Multiplanar CT image reconstructions were also generated. Multidetector CT imaging of the chest, abdomen and pelvis was performed following the standard protocol during bolus administration of intravenous contrast. RADIATION DOSE REDUCTION: This exam was performed according to the departmental dose-optimization program which includes automated exposure control, adjustment of the mA and/or kV according to patient size and/or use of iterative reconstruction technique. CONTRAST:  151m OMNIPAQUE IOHEXOL 300 MG/ML  SOLN COMPARISON:  PA and lateral chest and CTA chest, abdomen and pelvis 12/06/2021, chest CT with contrast 09/14/2019, head CT 12/06/2021, and cervical spine CT 12/06/2021. FINDINGS: CT HEAD FINDINGS Brain: There is moderately advanced global atrophy with atrophic ventriculomegaly, moderate to severe small vessel disease of the cerebral white matter, and stable appearance of bifrontal subdural hygromas. There are small chronic lacunar infarctions in both gangliocapsular areas. No asymmetry is seen concerning for an acute infarct, hemorrhage or mass. Chronic lacunar infarct in the superior left cerebellar hemisphere is also redemonstrated. Vascular: There is carotid atherosclerosis without hyperdense central vessels. Skull: Osteopenia. Negative for fracture or focal lesion. No visible scalp hematoma. Sinuses/Orbits: Fluid is again noted posteriorly in the left sphenoid air cell. There is increased patchy opacification of the ethmoid air cells. The frontal and visualized maxillary sinuses are clear. There is moderate patchy fluid increased throughout  the right mastoid air cells with no right middle ear opacity. There has developed extensive fluid throughout the left mastoid air cells  and middle ear cavity. No bone destruction is seen. Old lens replacements. Other: None. CT CERVICAL FINDINGS Alignment: Slight dextroscoliosis.  Otherwise normal. Skull base and vertebrae: Osteopenia. No fracture or primary bone lesion is seen. Soft tissues and spinal canal: No prevertebral fluid or swelling. No visible canal hematoma. There is bilateral carotid atherosclerosis. Disc levels: Moderate disc space loss again at C4-5 and C5-6. Trace marginal osteophytes. No significant soft tissue or bony encroachment on the thecal sac is seen. There is a mild nonstenosing posterior disc bulge again noted at C2-3. Facet joint and uncinate hypertrophy left-greater-than-right is seen at most levels, with multilevel left-sided foraminal stenosis greatest at C4-5 and C5-6. There is mild narrowing and spurring at the anterior atlantodental joint. Other:  None. CT CHEST FINDINGS Cardiovascular: Slightly dilated ascending aorta again measures 4.2 cm. There is diffuse aortic atherosclerosis with descending segment tortuosity, stable arch segment measurement 3.8 cm and ulcerative atheromatous plaque in the anterior aspect of the proximal arch, as before. The great vessels show scattered calcification without stenosis or aneurysm. There is no aortic or great vessel dissection. There is mild cardiomegaly with three-vessel calcific CAD. Pulmonary arteries are normal caliber and centrally clear. Pulmonary veins are decompressed. Mediastinum/Nodes: There is no intrathoracic adenopathy or focal tracheal disease. There is no esophageal thickening. Thyroid gland is small and atrophic. There is right axillary adenopathy with largest lymph node measuring 2.3 x 1.4 cm on 1:14. There are 2 adjacent mildly prominent left axillary lymph nodes not seen previously. Largest of these is 1.6 x 1.1 cm on 1:15. Clinical correlation advised. These lymph nodes were not enlarged on 09/14/2019 and left axillary adenopathy is new since. Lungs/Pleura:  Emphysematous and scarring changes are again shown. There is chronic atelectasis or scarring in the posterolateral right lower lobe superimposed on this there is more confluent increased opacity in the area which could be increased regional atelectasis or small pneumonia. Diffuse central bronchial thickening is seen as well as increased amount of scattered small posterior basal bronchiolar impactions. There are increased posterior basal left lower lobe tree-in-bud interstitial changes consistent with small airways disease/bronchiolitis. Findings could be due to interval chronic aspiration. Remaining lungs are generally clear. Musculoskeletal: Osteopenia, levoscoliosis and degenerative changes of the thoracic spine are again noted. There is again noted mild chronic anterior wedging of the T3 and T11 vertebral bodies. Today there is new demonstration of a mild upper plate anterior wedge compression fracture of the T6 vertebral body with 15% anterior and 5% posterior height loss without retropulsion, and a severe compression fracture of T7 body with posterosuperior cortical retropulsion up to 4 mm mildly encroaching on the ventral cord surface and narrowing the AP diameter of the thecal sac to 8.5 mm, with moderate to severe bilateral T7-8 foraminal stenosis secondarily. There is no displaced fracture in the visualized shoulder girdles, spinal posterior elements of the sternum. There are mildly displaced fractures of the right posterolateral ninth and tenth ribs. No other displaced rib fracture is seen. CT ABDOMEN PELVIS FINDINGS Hepatobiliary: No hepatic injury or perihepatic hematoma. Gallbladder is unremarkable Pancreas: Atrophic and otherwise unremarkable. Spleen: No splenic injury or perisplenic hematoma. Adrenals/Urinary Tract: No adrenal or renal mass enhancement. There is a small cyst in the posteroinferior left kidney. There is no urinary stone or obstruction. No thickening of the bladder wall. Stomach/Bowel:  No dilatation or wall thickening. An  appendix is not seen. Vascular/Lymphatic: Extensive aortoiliac calcific plaque noted, fusiform dilatation in the infrarenal aorta up to 4.5 cm which was seen previously, unchanged. No adjacent fat stranding is evident or other evidence of impending rupture. The portal vein is patent. No adenopathy is seen. Reproductive: Massive fibroid uterus again noted: Today's exam measuring 15.4 x 14.3 by 13.5 cm. Other: There are mild mesenteric and body wall congestive features which were noted previously. There is trace ascites in the posterior pelvis which was also seen previously. There is no free air, free hemorrhage or abscess. Musculoskeletal: Osteopenia, dextroscoliosis and degenerative change lumbar spine. No visible regional skeletal fracture or compression injury. IMPRESSION: 1. No acute intracranial CT findings or depressed skull fractures. 2. Chronic atrophy and small-vessel changes with stable bifrontal subdural hygromas. 3. Interval worsening right mastoid effusion with extensive diffuse opacification of left mastoid air cells and left middle ear cavity. Correlate clinically for otomastoiditis. 4. Worsening sinus disease in the ethmoids with continued fluid in the sphenoid sinus. 5. Osteopenia and degenerative change without appreciable cervical fracture. 6. Increased opacity in the right lower lobe which could be due to interval increased atelectasis, pulmonary contusions or infiltrates. 7. Increased scattered small posterior lower lobe bronchiolar impactions and increasing left lower lobe tree-in-bud interstitial change consistent with small airways disease. Consider aspiration precautions. 8. Mildly displaced fractures of the posterolateral right ninth ribs with acute appearance. 9. Mild upper plate anterior wedge compression fracture of T6 vertebral body new from 12/06/2021, possibly acute. 10. Severe anterior wedge compression fracture of the T7 vertebral body with 4 mm  retropulsion encroaching somewhat on the ventral cord surface with mild spinal stenosis and with biforaminal stenosis at T7-8. This could be acute or subacute but it was not seen on 12/06/2021. There is only slight thickening of the paraspinal soft tissues at this level compared to the previous exam. 11. Right axillary adenopathy again noted with interval development of mildly enlarged left axillary lymph nodes. Clinical correlation for lymphoproliferative disease or recurrent right breast cancer versus infectious process recommended. 12. Cardiomegaly with aortic and coronary artery atherosclerosis, 4.2 cm stable dilatation of the ascending aorta, and up to 4.5 cm diffuse aneurysmal dilatation in the abdominal aorta. Recommend imaging follow-up at six-month intervals and vascular consultation if not already done. 13. Again noted atheromatous ulcerative plaque anteriorly in the proximal ascending aorta. No dissection. 14. Massive fibroid uterus. Electronically Signed   By: Telford Nab M.D.   On: 02/19/2022 04:18   CT L-SPINE NO CHARGE  Result Date: 02/19/2022 CLINICAL DATA:  Initial evaluation for acute trauma, fall. EXAM: CT LUMBAR SPINE WITHOUT CONTRAST TECHNIQUE: Multidetector CT imaging of the lumbar spine was performed without intravenous contrast administration. Multiplanar CT image reconstructions were also generated. RADIATION DOSE REDUCTION: This exam was performed according to the departmental dose-optimization program which includes automated exposure control, adjustment of the mA and/or kV according to patient size and/or use of iterative reconstruction technique. COMPARISON:  Prior radiograph from 12/21/2021. FINDINGS: Segmentation: Bones are markedly osteopenic, somewhat limiting assessment for possible subtle nondisplaced fractures. Transitional features seen about the lumbosacral junction. For the purposes of this report, lowest full rib-bearing vertebral body is labeled T12. There is partial  sacralization of the L5 vertebral body. Alignment: Sigmoid scoliosis.  No significant listhesis. Vertebrae: Chronic compression deformity of T11 noted. Vertebral body height otherwise maintained without acute fracture. Visualized sacrum and pelvis grossly intact. SI joints symmetric and within normal limits. Partially visualized lower ribs grossly intact. No discrete or worrisome  osseous lesions. Paraspinal and other soft tissues: Paraspinous soft tissues demonstrate no acute finding. Markedly enlarged fibroid uterus noted. Intra-abdominal aneurysm measuring up to 4.6 cm in diameter noted, better evaluated on concomitant CT of the abdomen and pelvis. Disc levels: Underlying mild-to-moderate degenerative spondylosis, most pronounced at T12-L1 and L3-4. Scattered multilevel facet hypertrophy. No significant spinal stenosis by CT. IMPRESSION: 1. No acute traumatic injury within the lumbar spine. 2. Chronic compression deformity of T11. 3. Intra-abdominal aneurysm measuring up to 4.6 cm in diameter, better evaluated on concomitant CT of the abdomen and pelvis. Recommend follow-up every 6 months and vascular consultation. This recommendation follows ACR consensus guidelines: White Paper of the ACR Incidental Findings Committee II on Vascular Findings. J Am Coll Radiol 2013; 10:789-794. 4. Markedly enlarged fibroid uterus. Aortic Atherosclerosis (ICD10-I70.0). Electronically Signed   By: Jeannine Boga M.D.   On: 02/19/2022 03:33   DG Femur Min 2 Views Right  Result Date: 02/19/2022 CLINICAL DATA:  Status post fall. EXAM: RIGHT FEMUR 2 VIEWS COMPARISON:  Dec 06, 2021 FINDINGS: There is no evidence of an acute fracture or dislocation. A right knee replacement is seen without evidence of surrounding lucency to suggest the presence of hardware loosening or infection. Soft tissues are unremarkable. IMPRESSION: 1. Intact right knee replacement. 2. No acute osseous abnormality. Electronically Signed   By: Virgina Norfolk M.D.   On: 02/19/2022 01:03   DG Ribs Unilateral W/Chest Right  Result Date: 02/19/2022 CLINICAL DATA:  Right rib pain after fall EXAM: RIGHT RIBS AND CHEST - 3+ VIEW COMPARISON:  Radiographs 02/13/2022 FINDINGS: Demineralization limits sensitivity for subtle fractures. No displaced right rib fractures. Right TSA. Calcified tortuous aorta. Coronary stenting. Unchanged marked compression of a midthoracic vertebral body. IMPRESSION: Marked demineralization.  No definite displaced rib fractures. Electronically Signed   By: Placido Sou M.D.   On: 02/19/2022 00:56   DG Pelvis 1-2 Views  Result Date: 02/19/2022 CLINICAL DATA:  Status post fall. EXAM: PELVIS - 1-2 VIEW COMPARISON:  February 13, 2022 FINDINGS: There is no evidence of pelvic fracture or diastasis. No pelvic bone lesions are seen. IMPRESSION: Negative. Electronically Signed   By: Virgina Norfolk M.D.   On: 02/19/2022 00:55   CT CERVICAL SPINE WO CONTRAST  Result Date: 02/13/2022 CLINICAL DATA:  Neck trauma (Age >= 65y) Failure to thrive.  [place] out of bed twice this week. EXAM: CT CERVICAL SPINE WITHOUT CONTRAST TECHNIQUE: Multidetector CT imaging of the cervical spine was performed without intravenous contrast. Multiplanar CT image reconstructions were also generated. RADIATION DOSE REDUCTION: This exam was performed according to the departmental dose-optimization program which includes automated exposure control, adjustment of the mA and/or kV according to patient size and/or use of iterative reconstruction technique. COMPARISON:  12/06/2021 FINDINGS: Alignment: Normal. Skull base and vertebrae: No acute fracture. Vertebral body heights are maintained. The dens and skull base are intact. Soft tissues and spinal canal: No prevertebral fluid or swelling. No visible canal hematoma. Disc levels: Mild multilevel degenerative disc disease and facet hypertrophy. Degenerative changes are stable from prior. Upper chest: No acute or unexpected  findings. Other: None. IMPRESSION: 1. No acute fracture or subluxation of the cervical spine. 2. Mild multilevel degenerative disc disease and facet hypertrophy. Electronically Signed   By: Keith Rake M.D.   On: 02/13/2022 20:07   CT HEAD WO CONTRAST  Result Date: 02/13/2022 CLINICAL DATA:  Head trauma, minor (Age >= 65y) Failure to thrive.  Fall out of bed twice this week. EXAM: CT  HEAD WITHOUT CONTRAST TECHNIQUE: Contiguous axial images were obtained from the base of the skull through the vertex without intravenous contrast. RADIATION DOSE REDUCTION: This exam was performed according to the departmental dose-optimization program which includes automated exposure control, adjustment of the mA and/or kV according to patient size and/or use of iterative reconstruction technique. COMPARISON:  Most recent head CT 12/06/2021. FINDINGS: Brain: No acute intracranial hemorrhage. Stable degree of atrophy and confluent periventricular chronic small vessel ischemia. Chronic bifrontal subdural hygroma that are isodense to CSF, unchanged in size and appearance. This measures 9 mm in the left frontal region. Small remote left cerebellar infarct. There is no evidence of acute ischemia. No midline shift or mass effect. Vascular: No hyperdense vessel. Skull: No fracture or focal lesion. Sinuses/Orbits: Recurrent complete opacification of left mastoid air cells. There is partial opacification of lower right mastoid air cells that has progressed from prior exam. Chronic fluid in the left side of sphenoid sinus. Other: No confluent scalp hematoma. IMPRESSION: 1. No acute intracranial abnormality. No skull fracture. 2. Stable atrophy and chronic small vessel ischemia. Unchanged chronic bifrontal subdural hygroma. 3. Recurrent complete opacification of left mastoid air cells. Partial opacification of lower right mastoid air cells is chronic but progressed from prior exam. Electronically Signed   By: Keith Rake M.D.   On:  02/13/2022 20:03   DG Pelvis 1-2 Views  Result Date: 02/13/2022 CLINICAL DATA:  Initial evaluation for acute trauma, fall. EXAM: PELVIS - 1-2 VIEW COMPARISON:  Prior radiograph from 12/06/2021. FINDINGS: Bones are diffusely osteopenic, somewhat limiting evaluation for possible subtle acute nondisplaced fractures. No acute fracture or dislocation. Femoral heads in normal alignment within the acetabula. Femoral head height maintained. Bony pelvis grossly intact. IMPRESSION: No acute osseous abnormality about the pelvis. Electronically Signed   By: Jeannine Boga M.D.   On: 02/13/2022 20:00   DG Chest 1 View  Result Date: 02/13/2022 CLINICAL DATA:  Initial evaluation for failure to thrive. EXAM: CHEST  1 VIEW COMPARISON:  Prior radiograph from 01/28/2022. FINDINGS: Cardiac and mediastinal silhouettes are stable in size and contour, and remain within normal limits. Tortuosity the intrathoracic aorta noted. Aortic atherosclerosis. Lungs are normally inflated. Asymmetric hazy opacity seen overlying the right upper and mid lung. While this finding may in part be related to patient positioning, possible infiltrate could be considered. Superimposed scattered bibasilar subsegmental atelectasis and/or scarring. Blunting of the left facet phrenic angle suggestive of trace pleural effusion. No overt pulmonary edema. No pneumothorax. No acute osseous finding.  Right shoulder arthroplasty noted. IMPRESSION: 1. Asymmetric hazy opacity overlying the right upper and mid lung. While this finding may in part be related to patient positioning, possible infiltrate could be considered in the correct clinical setting. 2. Superimposed scattered bibasilar subsegmental atelectasis and/or scarring. 3. Trace left pleural effusion. Electronically Signed   By: Jeannine Boga M.D.   On: 02/13/2022 19:49   CT THORACIC SPINE WO CONTRAST  Result Date: 02/01/2022 CLINICAL DATA:  Mid back pain, compression fracture in T8 on  radiographs EXAM: CT THORACIC SPINE WITHOUT CONTRAST TECHNIQUE: Multidetector CT images of the thoracic were obtained using the standard protocol without intravenous contrast. RADIATION DOSE REDUCTION: This exam was performed according to the departmental dose-optimization program which includes automated exposure control, adjustment of the mA and/or kV according to patient size and/or use of iterative reconstruction technique. COMPARISON:  12/21/2021 radiographs correlation is also made with CT chest abdomen pelvis 12/06/2021 FINDINGS: Alignment: Scoliosis, with levoscoliosis of the upper thoracic spine and  dextroscoliosis of the thoracolumbar junction. Trace anterolisthesis of T2 on T3. Apparent moderate anterolisthesis of T6 on T7, secondary to T7 retropulsion. Vertebrae: Severe compression fracture at T7, which appears to be new compared to 12/06/2021 and may have progressed compared to 12/21/2021, with greater than 90% height loss anteriorly. 5 mm retropulsion of the posterosuperior cortex. The fracture may involve the pedicles but this is not definitively seen. Additional subacute compression fracture of T6 with approximately 10% vertebral body height loss. No additional acute fracture. Osteopenia. Redemonstrated compression deformities of T11 and T3. Endplate sclerosis at P29-J1. Paraspinal and other soft tissues: Aortic atherosclerosis. Redemonstrated ascending thoracic aortic aneurysm, which appears unchanged compared to the prior CTA. Linear opacities, likely atelectasis. Coronary artery calcifications. Disc levels: T6-T7: Mild spinal canal stenosis, secondary to retropulsion of the posterosuperior aspect of T7. T7-T8: Moderate right and moderate to severe left neural foraminal narrowing T7-T8. T10-T11: Mild right and moderate left neural foraminal narrowing. T12-L1: Moderate left neural foraminal narrowing. IMPRESSION: 1. Subacute compression fracture at T7, with greater than 90% height loss anteriorly,  which may extend to the pedicles. 5 mm retropulsion of the posterosuperior cortex causes T6-T7 mild spinal canal stenosis and T7-T8 moderate to severe left and moderate right neural foraminal narrowing. 2. Additional subacute compression fracture of T6 with approximately 10% vertebral body height loss. Electronically Signed   By: Merilyn Baba M.D.   On: 02/01/2022 16:26   DG Chest Port 1 View  Result Date: 01/28/2022 CLINICAL DATA:  Generalized weakness 1 day. Cough. Fall while getting out of bed. EXAM: PORTABLE CHEST 1 VIEW COMPARISON:  12/06/2021 FINDINGS: Lungs are adequately inflated with mild linear density right base likely atelectasis. No effusion or pneumothorax. Cardiomediastinal silhouette and remainder of the exam is unchanged. IMPRESSION: Mild linear density right base likely atelectasis. Electronically Signed   By: Marin Olp M.D.   On: 01/28/2022 13:39     Discharge Exam: Vitals:   02/26/22 0517 02/26/22 1255  BP: (!) 148/72 134/77  Pulse: 82 84  Resp: 18 18  Temp: (!) 97.5 F (36.4 C) 97.9 F (36.6 C)  SpO2: 100% 100%   Vitals:   02/25/22 1405 02/25/22 2100 02/26/22 0517 02/26/22 1255  BP: (!) 141/93 (!) 153/84 (!) 148/72 134/77  Pulse: 86 (!) 54 82 84  Resp: '18 18 18 18  '$ Temp: 97.7 F (36.5 C) 98.1 F (36.7 C) (!) 97.5 F (36.4 C) 97.9 F (36.6 C)  TempSrc: Oral Oral Oral Oral  SpO2: 94% 96% 100% 100%    General: Pt is alert, awake, not in acute distress Cardiovascular: RRR, S1/S2 +, no rubs, no gallops Respiratory: CTA bilaterally, no wheezing, no rhonchi Abdominal: Soft, NT, ND, bowel sounds + Extremities: no edema, no cyanosis    The results of significant diagnostics from this hospitalization (including imaging, microbiology, ancillary and laboratory) are listed below for reference.     Microbiology: Recent Results (from the past 240 hour(s))  MRSA Next Gen by PCR, Nasal     Status: None   Collection Time: 02/20/22  7:48 PM   Specimen: Nasal  Mucosa; Nasal Swab  Result Value Ref Range Status   MRSA by PCR Next Gen NOT DETECTED NOT DETECTED Final    Comment: (NOTE) The GeneXpert MRSA Assay (FDA approved for NASAL specimens only), is one component of a comprehensive MRSA colonization surveillance program. It is not intended to diagnose MRSA infection nor to guide or monitor treatment for MRSA infections. Test performance is not FDA approved in patients  less than 86 years old. Performed at [facility], [place] [address] [place], [place] [address]   Culture, blood (routine x 2) Call MD if unable to obtain prior to antibiotics being given     Status: None   Collection Time: 02/20/22 10:43 PM   Specimen: BLOOD  Result Value Ref Range Status   Specimen Description   Final    BLOOD BLOOD LEFT FOREARM Performed at [facility] [address] [place], [place] [address]    Special Requests   Final    BOTTLES DRAWN AEROBIC ONLY Blood Culture adequate volume Performed at [facility] [address], [place], [place] [address]    Culture   Final    NO GROWTH 5 DAYS Performed at [facility], [place] [address] [place], [place] [address]    Report Status 02/26/2022 FINAL  Final  Culture, blood (routine x 2) Call MD if unable to obtain prior to antibiotics being given     Status: None   Collection Time: 02/20/22 10:43 PM   Specimen: BLOOD  Result Value Ref Range Status   Specimen Description   Final    BLOOD BLOOD LEFT HAND Performed at [facility] [address], [place], [place] [address]    Special Requests   Final    BOTTLES DRAWN AEROBIC ONLY Blood Culture results may not be optimal due to an inadequate volume of blood received in culture bottles Performed at [facility] [address], [place], [place] [address]    Culture   Final    NO GROWTH 5 DAYS Performed at [facility], [place] [address] [place],   [address]    Report Status 02/26/2022 FINAL  Final     Labs: BNP (last 3 results) Recent Labs    12/06/21 1651  BNP 086.7*   Basic Metabolic Panel: Recent Labs  Lab 02/21/22 0322 02/22/22 0239 02/23/22 0539 02/24/22 0831 02/25/22 1036  NA 143 141 144 139 139  K 3.7 3.7 2.9* 3.8 3.7  CL 97* 95* 98 97* 97*  CO2 37* 37* 36* 31 32  GLUCOSE 149* 131* 119* 122* 123*  BUN '17 15 13 17 19  '$ CREATININE 0.49 0.43* 0.51 0.52 0.49  CALCIUM 8.7* 8.4* 8.8* 8.8* 8.6*  MG 1.7  --   --   --   --    Liver Function Tests: Recent Labs  Lab 02/20/22 1600 02/21/22 0322  AST 27 25  ALT 12 10  ALKPHOS 45 37*  BILITOT 0.7 0.8  PROT 7.0 6.2*  ALBUMIN 3.6 3.0*   No results for input(s): "LIPASE", "AMYLASE" in the last 168 hours. No results for input(s): "AMMONIA" in the last 168 hours. CBC: Recent Labs  Lab 02/20/22 1600 02/21/22 0322 02/22/22 0239 02/23/22 0539  WBC 5.6 4.6 4.1 5.4  NEUTROABS 4.0 4.3 3.7  --   HGB 10.9* 9.8* 9.3* 11.8*  HCT 36.9 32.5* 29.6* 36.6  MCV 109.2* 108.0* 102.8* 101.1*  PLT 143* 132* 128* 175   Cardiac Enzymes: No results for input(s): "CKTOTAL", "CKMB", "CKMBINDEX", "TROPONINI" in the last 168 hours. BNP: Invalid input(s): "POCBNP" CBG: Recent Labs  Lab 02/20/22 1711  GLUCAP 118*   D-Dimer No results for input(s): "DDIMER" in the last 72 hours. Hgb A1c No results for input(s): "HGBA1C" in the last 72 hours. Lipid Profile No results for input(s): "CHOL", "HDL", "LDLCALC", "TRIG", "CHOLHDL", "LDLDIRECT" in the last 72 hours. Thyroid function studies  No results for input(s): "TSH", "T4TOTAL", "T3FREE", "THYROIDAB" in the last 72 hours.  Invalid input(s): "FREET3" Anemia work up No results for input(s): "VITAMINB12", "FOLATE", "FERRITIN", "TIBC", "IRON", "RETICCTPCT" in the last 72 hours. Urinalysis    Component Value Date/Time   COLORURINE AMBER (A) 02/20/2022 2007   APPEARANCEUR HAZY (A) 02/20/2022 2007   LABSPEC 1.039 (H) 02/20/2022 2007    PHURINE 5.0 02/20/2022 2007   GLUCOSEU NEGATIVE 02/20/2022 2007   GLUCOSEU NEGATIVE 10/24/2016 1429   HGBUR NEGATIVE 02/20/2022 2007   BILIRUBINUR NEGATIVE 02/20/2022 2007   BILIRUBINUR negative 02/09/2022 1545   BILIRUBINUR negative 09/20/2016 1625   KETONESUR NEGATIVE 02/20/2022 2007   PROTEINUR 30 (A) 02/20/2022 2007   UROBILINOGEN 0.2 02/09/2022 1545   UROBILINOGEN 0.2 10/24/2016 1429   NITRITE NEGATIVE 02/20/2022 2007   LEUKOCYTESUR TRACE (A) 02/20/2022 2007   Sepsis Labs Recent Labs  Lab 02/20/22 1600 02/21/22 0322 02/22/22 0239 02/23/22 0539  WBC 5.6 4.6 4.1 5.4   Microbiology Recent Results (from the past 240 hour(s))  MRSA Next Gen by PCR, Nasal     Status: None   Collection Time: 02/20/22  7:48 PM   Specimen: Nasal Mucosa; Nasal Swab  Result Value Ref Range Status   MRSA by PCR Next Gen NOT DETECTED NOT DETECTED Final    Comment: (NOTE) The GeneXpert MRSA Assay (FDA approved for NASAL specimens only), is one component of a comprehensive MRSA colonization surveillance program. It is not intended to diagnose MRSA infection nor to guide or monitor treatment for MRSA infections. Test performance is not FDA approved in patients less than 68 years old. Performed at [facility], [place] [address], [place], [place] [address]   Culture, blood (routine x 2) Call MD if unable to obtain prior to antibiotics being given     Status: None   Collection Time: 02/20/22 10:43 PM   Specimen: BLOOD  Result Value Ref Range Status   Specimen Description   Final    BLOOD BLOOD LEFT FOREARM Performed at [facility] [address], [place], [place] [address]    Special Requests   Final    BOTTLES DRAWN AEROBIC ONLY Blood Culture adequate volume Performed at [facility] [address] [place], [place] [address]    Culture   Final    NO GROWTH 5 DAYS Performed at [facility], [place] [address], [place], [place]  [address]    Report Status 02/26/2022 FINAL  Final  Culture, blood (routine x 2) Call MD if unable to obtain prior to antibiotics being given     Status: None   Collection Time: 02/20/22 10:43 PM   Specimen: BLOOD  Result Value Ref Range Status   Specimen Description   Final    BLOOD BLOOD LEFT HAND Performed at [facility] [address] [place], [place] [address]    Special Requests   Final    BOTTLES DRAWN AEROBIC ONLY Blood Culture results may not be optimal due to an inadequate volume of blood received in culture bottles Performed at [facility] [place] [address] [place], [place] [address]    Culture   Final    NO GROWTH 5 DAYS Performed at [facility], [place] [address], [place], [place] [address]    Report Status 02/26/2022 FINAL  Final     Time coordinating discharge: Over 30 minutes  SIGNED:   Darliss Cheney, MD  [facility] 02/26/2022, 4:16 PM *Please  note that this is a verbal dictation therefore any spelling or grammatical errors are due to the "[place] One" system interpretation. If 7PM-7AM, please contact night-coverage [email or web address]

## 2022-02-26 NOTE — Telephone Encounter (Signed)
[place] RECORD Access[facility]urse Patient Name: [name] [facility] Gender: Female DOB: Jun 07, 1931 Age: 88 Y 16 D Return Phone Number: [phone] (Primary) Address: City/ State/ Zip: [place]  [address] Client [place] Night - Client Client Site [place] - Night Provider Renford Dills - MD Contact Type Call Who Is Calling Patient / Member / Family / Caregiver Call Type Triage / Clinical Caller Name Emer Onnen Relationship To Patient Daughter Return Phone Number [phone] (Primary) Chief Complaint Health information question (non symptomatic) Reason for Call Medication Question / Request Initial Comment Caller states her mom has pneumonia and Dr. Damita Dunnings called 911 and had her mom admitted to the hospital. Caller states the hospital is wanting to discharge her as of now to a rehab. Caller states she was given 3 options for rehab and none of them are rehab centers they feel comfortable leaving their mom at. Caller is wanting to speak with an on call provider to see if they have any recommendations or the ability to stay for the weekend. Caller is requesting to speak with an on call provider regarding this matter. Translation No Nurse Assessment Nurse: Patsey Berthold, RN, Roma Kayser Date/Time Eilene Ghazi Time): 02/24/2022 11:19:31 AM Confirm and document reason for call. If symptomatic, describe symptoms. ---Caller states her mom has pneumonia and Dr. Damita Dunnings called 911 (tuesday- CO2 levels high, cracked ribs, pneumonia, confusion and hallucination) and had her mom admitted to the hospital. Caller states the hospital is wanting to discharge her as of now to a rehab. Caller states she was given 3 options for rehab and none of them are rehab centers they feel comfortable leaving their mom at. Caller is wanting to speak with an on call provider to see if they have any  recommendations or the ability to stay for the weekend. Caller is requesting to speak with an on call provider regarding this matter. Daughter (caller) is In in [place]. Does the patient have any new or worsening symptoms? ---No Please document clinical information provided and list any resource used. ---Caller is concerned that mother is to be D/C's from hospital to a nursing/ rehab she does not approve. Caller is [place], mother is in [place]. PLEASE NOTE: All timestamps contained within this report are represented as [place] Standard Time. CONFIDENTIALTY NOTICE: This fax transmission is intended only for the addressee. It contains information that is legally privileged, confidential or otherwise protected from use or disclosure. If you are not the intended recipient, you are strictly prohibited from reviewing, disclosing, copying using or disseminating any of this information or taking any action in reliance on or regarding this information. If you have received this fax in error, please notify us immediately by telephone so that we can arrange for its return to [place]. Phone: [phone], Toll-Free: [phone], Fax: [phone] Page: 2 of 2 Call Id: [id number] [place]. Time Eilene Ghazi Time) Disposition Final User 02/24/2022 11:31:01 AM Clinical Call Yes Patsey Berthold RN, Roma Kayser Final Disposition 02/24/2022 11:31:01 AM Clinical Call Yes Patsey Berthold, RN, Roma Kayser Comments User: Diana Eves, RN Date/Time Eilene Ghazi Time): 02/24/2022 11:30:18 AM Explained to caller she needs to verbalized concerns to hospital MD and social worker who is arranging D/C. Caller verbalized understanding. Referrals REFERRED TO PCP OFFIC

## 2022-02-26 NOTE — Telephone Encounter (Signed)
Pt is still an inpatient at [facility]; sending note to Dr Damita Dunnings and Janett Billow.

## 2022-02-26 NOTE — Care Management Important Message (Signed)
Important Message  Patient Details IM Letter placed in Patients room. Name: [name] MRN: [id number] Date of Birth: [birth year]   Medicare Important Message Given:  Yes     Kerin Salen 02/26/2022, 11:23 AM

## 2022-02-26 NOTE — Progress Notes (Signed)
Called and spoke with Doctor, hospital at [facility]. Report given and Caren Griffins aware Corey Harold has been called for transportation.

## 2022-02-26 NOTE — TOC Progression Note (Signed)
Transition of Care [facility]) - Progression Note    Patient Details  Name: Yatzari Jonsson MRN: [id number] Date of Birth: Feb 20, 1931  Transition of Care [facility]) CM/SW [place], [place] Phone Number: 02/26/2022, 10:34 AM  Clinical Narrative:    Pt's family have accepted bed offer for [facility]. Insurance authorization has been requested and currently pending approval. Pt is able to transfer once insurance authorization is approved.    Expected Discharge Plan: [place] Barriers to Discharge: Continued Medical Work up  Expected Discharge Plan and Services Expected Discharge Plan: [place]   Discharge Planning Services: CM Consult Post Acute Care Choice: [place] Living arrangements for the past 2 months: Single Family Home                                       Social Determinants of Health (SDOH) Interventions    Readmission Risk Interventions    02/21/2022   11:13 AM 12/13/2021    1:29 PM  Readmission Risk Prevention Plan  Transportation Screening Complete   Medication Review (RN Care Manager) Complete   PCP or Specialist appointment within 3-5 days of discharge Complete Complete  HRI or Home Care Consult Complete Complete  Palliative Care Screening Not Applicable Not Applicable  Skilled Nursing Facility Complete Complete

## 2022-02-26 NOTE — Progress Notes (Signed)
PROGRESS NOTE    [name]  WCB:[id number] DOB: 1931-06-10 DOA: 02/20/2022 PCP: Tonia Ghent, MD   Brief Narrative:  [name] is a 88 y.o. female with PMH significant for COPD on 3 LPM O2, mild chronic systolic CHF (Echo 12/1605 EF 40-45%), CAD (STEMI 01/2019 with DES to RCA), right breast cancer (ER positive invasive ductal carcinoma on Anastrozole), gastroesophageal reflux disease, hyperlipidemia, anemia of chronic disease, hypothyroidism and mild dementia Patient was brought to the ED on 8/15 with worsening confusion. 8/13, patient fell at home while attempting to get out of bed.  She was found by daughter with her walker on top of her.  She complained of right-sided chest pain and was brought to the ED.  CT chest showed mildly displaced fracture of the posterior right ninth and 10th ribs, compression fracture of the T6 as well as anterior wedge compression fracture of T7.  Patient was treated for pain with fentanyl, Motrin, Tylenol.  She did not meet criteria for admission and hence was discharged to home. At home, she has been experiencing progressively worsening pain, agitation and confusion.  On 8/15, seen by PCP and sent to ED again. In the ED, patient was agitated and intermittently following commands.  Afebrile, hemodynamically stable. VBG showed compensated respiratory acidosis with significantly elevated PCO2 at 115 and bicarbonate at 59.  Baseline CO2 around 70. BiPAP was initiated Admitted to hospitalist service for further evaluation management.  Assessment & Plan:   Principal Problem:   Acute on chronic respiratory failure with hypoxia and hypercapnia (HCC) Active Problems:   COPD with acute exacerbation (HCC)   Aspiration pneumonia of right lower lobe (HCC)   Acute metabolic encephalopathy   Coronary artery disease involving native coronary artery of native heart without angina pectoris   Multiple fractures of ribs, right side, subsequent encounter  for fracture with routine healing   Elevated troponin level not due myocardial infarction   History of right breast cancer   Acquired hypothyroidism   Mixed hyperlipidemia   GERD without esophagitis   Dementia with behavioral disturbance (HCC)   Goals of care, counseling/discussion  Acute on chronic respiratory failure with hypoxia and hypercapnia  - Presented with progressively worsening confusion, agitation after 2 days of rib fractures.  Patient probably had limitation in lung expansion that worsened her respiratory failure precipitating significantly elevated CO2.  Required BiPAP.  - DNR/DNI per family, he has improved significantly, has not required BiPAP since 02/21/2022.  Currently on 3 L oxygen which is at baseline.   Aspiration pneumonia Acute exacerbation of COPD - CT chest on 8/13 showed developing right lower lobe infiltrate concerning for aspiration pneumonia. Completed 5 days of IV Solu-Medrol, Rocephin and Zithromax on 02/25/2022. -She was encouraged to use incentive spirometry . -Continue with bronchodilators. -PT consulted, encouraged to ambulate. -SLP consulted given history of dysphagia.    Right ninth and 10th rib fractures - After a fall on 8/13. - Conservative management with adequate pain control. - Currently on Tylenol and Lidoderm patch.  Pain is very well controlled.   Acute metabolic encephalopathy Underlying dementia - Acute agitation secondary to hypercapnia.  - PTA on Xanax as needed, Aricept at bedtime, Cymbalta 60 mg daily, BuSpar 5 mg twice daily trazodone 100 mg at bedtime - Currently continued on these.  Continue to monitor mental status   Mildly elevated troponin History of CAD HLD Chest pain likely due to rib fractures.  Only mildly elevated troponin.   Continue Crestor.   Chronic  systolic congestive heart failure: Echo done in 2020 shows 45% ejection fraction.  Currently euvolemic.  Essential hypertension: Does not seem to be taking any  medications for this PTA.  Has been started on Coreg and lisinopril during this hospitalization.  Blood pressure now controlled.   History of right breast cancer Continue anastrozole   Acquired hypothyroidism Continue Synthroid    GERD  Continue PPI  Hypokalemia: Resolved.    Goals of care, counseling/discussion Confirmed DNR/DNI status on admission with daughter Prognosis guarded  Disposition: Patient is medically stable.  Family has finally picked up a facility but insurance authorization is pending.   DVT prophylaxis: enoxaparin (LOVENOX) injection 40 mg Start: 02/20/22 2230   Code Status: DNR  Family Communication:  None present at bedside.    Status is: Inpatient Remains inpatient appropriate because: Waiting for insurance authorization.   Estimated body mass index is 23.43 kg/m as calculated from the following:   Height as of an earlier encounter on 02/20/22: '5\' 1"'$  (1.549 m).   Weight as of an earlier encounter on 02/20/22: 56.2 kg.    Nutritional Assessment: There is no height or weight on file to calculate BMI.. Seen by dietician.  I agree with the assessment and plan as outlined below: Nutrition Status:        . Skin Assessment: I have examined the patient's skin and I agree with the wound assessment as performed by the wound care RN as outlined below:    Consultants:  None  Procedures:  None  Antimicrobials:  Anti-infectives (From admission, onward)    Start     Dose/Rate Route Frequency Ordered Stop   02/24/22 2200  azithromycin (ZITHROMAX) tablet 500 mg  Status:  Discontinued        500 mg Oral Daily at bedtime 02/24/22 1043 02/25/22 1102   02/20/22 2230  cefTRIAXone (ROCEPHIN) 2 g in sodium chloride 0.9 % 100 mL IVPB  Status:  Discontinued        2 g 200 mL/hr over 30 Minutes Intravenous Every 24 hours 02/20/22 2131 02/25/22 1102   02/20/22 2230  azithromycin (ZITHROMAX) 500 mg in sodium chloride 0.9 % 250 mL IVPB  Status:  Discontinued         500 mg 250 mL/hr over 60 Minutes Intravenous Every 24 hours 02/20/22 2131 02/24/22 1043         Subjective:  Patient seen and examined.  Yet again she says " I do not feel well" and again she cannot elaborate any symptoms.  She appears stable and well.  Objective: Vitals:   02/25/22 1405 02/25/22 2100 02/26/22 0517 02/26/22 1255  BP: (!) 141/93 (!) 153/84 (!) 148/72 134/77  Pulse: 86 (!) 54 82 84  Resp: '18 18 18 18  '$ Temp: 97.7 F (36.5 C) 98.1 F (36.7 C) (!) 97.5 F (36.4 C) 97.9 F (36.6 C)  TempSrc: Oral Oral Oral Oral  SpO2: 94% 96% 100% 100%    Intake/Output Summary (Last 24 hours) at 02/26/2022 1328 Last data filed at 02/26/2022 0522 Gross per 24 hour  Intake --  Output 375 ml  Net -375 ml    There were no vitals filed for this visit.  Examination:  General exam: Appears calm and comfortable  Respiratory system: Diminished breath sounds bilaterally. Respiratory effort normal. Cardiovascular system: S1 & S2 heard, RRR. No JVD, murmurs, rubs, gallops or clicks. No pedal edema. Gastrointestinal system: Abdomen is nondistended, soft and nontender. No organomegaly or masses felt. Normal bowel sounds heard. Central nervous  system: Alert and oriented. No focal neurological deficits. Extremities: Symmetric 5 x 5 power. Skin: No rashes, lesions or ulcers.  Psychiatry: Judgement and insight appear poor  Data Reviewed: I have personally reviewed following labs and imaging studies  CBC: Recent Labs  Lab 02/20/22 1600 02/21/22 0322 02/22/22 0239 02/23/22 0539  WBC 5.6 4.6 4.1 5.4  NEUTROABS 4.0 4.3 3.7  --   HGB 10.9* 9.8* 9.3* 11.8*  HCT 36.9 32.5* 29.6* 36.6  MCV 109.2* 108.0* 102.8* 101.1*  PLT 143* 132* 128* 366    Basic Metabolic Panel: Recent Labs  Lab 02/21/22 0322 02/22/22 0239 02/23/22 0539 02/24/22 0831 02/25/22 1036  NA 143 141 144 139 139  K 3.7 3.7 2.9* 3.8 3.7  CL 97* 95* 98 97* 97*  CO2 37* 37* 36* 31 32  GLUCOSE 149* 131* 119* 122*  123*  BUN '17 15 13 17 19  '$ CREATININE 0.49 0.43* 0.51 0.52 0.49  CALCIUM 8.7* 8.4* 8.8* 8.8* 8.6*  MG 1.7  --   --   --   --     GFR: Estimated Creatinine Clearance: 36 mL/min (by C-G formula based on SCr of 0.49 mg/dL). Liver Function Tests: Recent Labs  Lab 02/20/22 1600 02/21/22 0322  AST 27 25  ALT 12 10  ALKPHOS 45 37*  BILITOT 0.7 0.8  PROT 7.0 6.2*  ALBUMIN 3.6 3.0*    No results for input(s): "LIPASE", "AMYLASE" in the last 168 hours. No results for input(s): "AMMONIA" in the last 168 hours. Coagulation Profile: No results for input(s): "INR", "PROTIME" in the last 168 hours. Cardiac Enzymes: No results for input(s): "CKTOTAL", "CKMB", "CKMBINDEX", "TROPONINI" in the last 168 hours.  BNP (last 3 results) No results for input(s): "PROBNP" in the last 8760 hours. HbA1C: No results for input(s): "HGBA1C" in the last 72 hours. CBG: Recent Labs  Lab 02/20/22 1711  GLUCAP 118*    Lipid Profile: No results for input(s): "CHOL", "HDL", "LDLCALC", "TRIG", "CHOLHDL", "LDLDIRECT" in the last 72 hours. Thyroid Function Tests: No results for input(s): "TSH", "T4TOTAL", "FREET4", "T3FREE", "THYROIDAB" in the last 72 hours. Anemia Panel: No results for input(s): "VITAMINB12", "FOLATE", "FERRITIN", "TIBC", "IRON", "RETICCTPCT" in the last 72 hours. Sepsis Labs: No results for input(s): "PROCALCITON", "LATICACIDVEN" in the last 168 hours.  Recent Results (from the past 240 hour(s))  MRSA Next Gen by PCR, Nasal     Status: None   Collection Time: 02/20/22  7:48 PM   Specimen: Nasal Mucosa; Nasal Swab  Result Value Ref Range Status   MRSA by PCR Next Gen NOT DETECTED NOT DETECTED Final    Comment: (NOTE) The GeneXpert MRSA Assay (FDA approved for NASAL specimens only), is one component of a comprehensive MRSA colonization surveillance program. It is not intended to diagnose MRSA infection nor to guide or monitor treatment for MRSA infections. Test performance is not  FDA approved in patients less than 62 years old. Performed at [facility], [place] [address], [place], [place] [address]   Culture, blood (routine x 2) Call MD if unable to obtain prior to antibiotics being given     Status: None   Collection Time: 02/20/22 10:43 PM   Specimen: BLOOD  Result Value Ref Range Status   Specimen Description   Final    BLOOD BLOOD LEFT FOREARM Performed at [facility] [address] [place], [place] [address]    Special Requests   Final    BOTTLES DRAWN AEROBIC ONLY Blood Culture adequate  volume Performed at [facility], [place] [address], [place], [place] [address]    Culture   Final    NO GROWTH 5 DAYS Performed at [facility], [place] [address] [place], [place] [address]    Report Status 02/26/2022 FINAL  Final  Culture, blood (routine x 2) Call MD if unable to obtain prior to antibiotics being given     Status: None   Collection Time: 02/20/22 10:43 PM   Specimen: BLOOD  Result Value Ref Range Status   Specimen Description   Final    BLOOD BLOOD LEFT HAND Performed at [facility] [address], [place], [place] [address]    Special Requests   Final    BOTTLES DRAWN AEROBIC ONLY Blood Culture results may not be optimal due to an inadequate volume of blood received in culture bottles Performed at [facility] [address], [place], [place] [address]    Culture   Final    NO GROWTH 5 DAYS Performed at [facility], [place] [address] [place], [place] [address]    Report Status 02/26/2022 FINAL  Final     Radiology Studies: No results found.  Scheduled Meds:  acetaminophen  1,000 mg Oral Once   acetaminophen  650 mg Oral BID   anastrozole  1 mg Oral Daily   aspirin EC  81 mg Oral Daily   busPIRone  5 mg Oral BID   carvedilol  3.125 mg Oral BID WC   donepezil  5 mg Oral QHS   DULoxetine  60 mg Oral Daily   enoxaparin (LOVENOX)  injection  40 mg Subcutaneous Q24H   levothyroxine  112 mcg Oral Q0600   lidocaine  1 patch Transdermal Q24H   lisinopril  20 mg Oral Daily   mouth rinse  15 mL Mouth Rinse 4 times per day   pantoprazole  40 mg Oral Daily   rosuvastatin  10 mg Oral Daily   traZODone  100 mg Oral QHS   Continuous Infusions:     LOS: 6 days   Darliss Cheney, MD [facility]  02/26/2022, 1:28 PM   *Please note that this is a verbal dictation therefore any spelling or grammatical errors are due to the "[facility]" system interpretation.  Please page via [place] and do not message via secure chat for urgent patient care matters. Secure chat can be used for non urgent patient care matters.  How to contact the [facility] Attending or Consulting provider [place] or covering provider during after hours [place], for this patient?  Check the care team in [facility] and look for a) attending/consulting [facility] provider listed and b) the [facility] team listed. Page or secure chat [phone]. Log into [email or web address] and use [facility]'s universal password to access. If you do not have the password, please contact the hospital operator. Locate the [facility] provider you are looking for under [facility] and page to a number that you can be directly reached. If you still have difficulty reaching the provider, please page the [facility] (Director on Call) for the Hospitalists listed on amion for assistance.

## 2022-02-27 ENCOUNTER — Telehealth: Payer: Self-pay | Admitting: *Deleted

## 2022-02-27 DIAGNOSIS — S2241XD Multiple fractures of ribs, right side, subsequent encounter for fracture with routine healing: Secondary | ICD-10-CM | POA: Diagnosis not present

## 2022-02-27 DIAGNOSIS — I5022 Chronic systolic (congestive) heart failure: Secondary | ICD-10-CM | POA: Diagnosis not present

## 2022-02-27 DIAGNOSIS — J449 Chronic obstructive pulmonary disease, unspecified: Secondary | ICD-10-CM | POA: Diagnosis not present

## 2022-02-27 DIAGNOSIS — J9622 Acute and chronic respiratory failure with hypercapnia: Secondary | ICD-10-CM | POA: Diagnosis not present

## 2022-02-27 DIAGNOSIS — J690 Pneumonitis due to inhalation of food and vomit: Secondary | ICD-10-CM | POA: Diagnosis not present

## 2022-02-27 DIAGNOSIS — J9621 Acute and chronic respiratory failure with hypoxia: Secondary | ICD-10-CM | POA: Diagnosis not present

## 2022-02-27 DIAGNOSIS — Z9181 History of falling: Secondary | ICD-10-CM | POA: Diagnosis not present

## 2022-02-27 NOTE — Telephone Encounter (Signed)
I have to defer to inpatient team on placement.

## 2022-02-28 DIAGNOSIS — S22000A Wedge compression fracture of unspecified thoracic vertebra, initial encounter for closed fracture: Secondary | ICD-10-CM | POA: Diagnosis not present

## 2022-02-28 DIAGNOSIS — J690 Pneumonitis due to inhalation of food and vomit: Secondary | ICD-10-CM | POA: Diagnosis not present

## 2022-02-28 DIAGNOSIS — I10 Essential (primary) hypertension: Secondary | ICD-10-CM | POA: Diagnosis not present

## 2022-02-28 DIAGNOSIS — S2241XA Multiple fractures of ribs, right side, initial encounter for closed fracture: Secondary | ICD-10-CM | POA: Diagnosis not present

## 2022-02-28 DIAGNOSIS — R296 Repeated falls: Secondary | ICD-10-CM | POA: Diagnosis not present

## 2022-02-28 DIAGNOSIS — J9622 Acute and chronic respiratory failure with hypercapnia: Secondary | ICD-10-CM | POA: Diagnosis not present

## 2022-02-28 DIAGNOSIS — F0390 Unspecified dementia without behavioral disturbance: Secondary | ICD-10-CM | POA: Diagnosis not present

## 2022-02-28 DIAGNOSIS — G9341 Metabolic encephalopathy: Secondary | ICD-10-CM | POA: Diagnosis not present

## 2022-02-28 DIAGNOSIS — M6281 Muscle weakness (generalized): Secondary | ICD-10-CM | POA: Diagnosis not present

## 2022-03-02 ENCOUNTER — Telehealth: Payer: Medicare PPO | Admitting: *Deleted

## 2022-03-02 DIAGNOSIS — G9341 Metabolic encephalopathy: Secondary | ICD-10-CM | POA: Diagnosis not present

## 2022-03-02 DIAGNOSIS — F0390 Unspecified dementia without behavioral disturbance: Secondary | ICD-10-CM | POA: Diagnosis not present

## 2022-03-02 DIAGNOSIS — J9622 Acute and chronic respiratory failure with hypercapnia: Secondary | ICD-10-CM | POA: Diagnosis not present

## 2022-03-02 DIAGNOSIS — S22000A Wedge compression fracture of unspecified thoracic vertebra, initial encounter for closed fracture: Secondary | ICD-10-CM | POA: Diagnosis not present

## 2022-03-02 DIAGNOSIS — I10 Essential (primary) hypertension: Secondary | ICD-10-CM | POA: Diagnosis not present

## 2022-03-02 DIAGNOSIS — S2241XA Multiple fractures of ribs, right side, initial encounter for closed fracture: Secondary | ICD-10-CM | POA: Diagnosis not present

## 2022-03-02 DIAGNOSIS — R296 Repeated falls: Secondary | ICD-10-CM | POA: Diagnosis not present

## 2022-03-02 DIAGNOSIS — M6281 Muscle weakness (generalized): Secondary | ICD-10-CM | POA: Diagnosis not present

## 2022-03-02 DIAGNOSIS — J690 Pneumonitis due to inhalation of food and vomit: Secondary | ICD-10-CM | POA: Diagnosis not present

## 2022-03-05 DIAGNOSIS — I10 Essential (primary) hypertension: Secondary | ICD-10-CM | POA: Diagnosis not present

## 2022-03-05 DIAGNOSIS — J690 Pneumonitis due to inhalation of food and vomit: Secondary | ICD-10-CM | POA: Diagnosis not present

## 2022-03-05 DIAGNOSIS — G9341 Metabolic encephalopathy: Secondary | ICD-10-CM | POA: Diagnosis not present

## 2022-03-05 DIAGNOSIS — S2241XA Multiple fractures of ribs, right side, initial encounter for closed fracture: Secondary | ICD-10-CM | POA: Diagnosis not present

## 2022-03-05 DIAGNOSIS — S22000A Wedge compression fracture of unspecified thoracic vertebra, initial encounter for closed fracture: Secondary | ICD-10-CM | POA: Diagnosis not present

## 2022-03-05 DIAGNOSIS — R296 Repeated falls: Secondary | ICD-10-CM | POA: Diagnosis not present

## 2022-03-05 DIAGNOSIS — M6281 Muscle weakness (generalized): Secondary | ICD-10-CM | POA: Diagnosis not present

## 2022-03-05 DIAGNOSIS — J9622 Acute and chronic respiratory failure with hypercapnia: Secondary | ICD-10-CM | POA: Diagnosis not present

## 2022-03-05 DIAGNOSIS — F0390 Unspecified dementia without behavioral disturbance: Secondary | ICD-10-CM | POA: Diagnosis not present

## 2022-03-06 ENCOUNTER — Telehealth: Payer: Self-pay | Admitting: *Deleted

## 2022-03-06 ENCOUNTER — Encounter: Payer: Self-pay | Admitting: *Deleted

## 2022-03-06 ENCOUNTER — Other Ambulatory Visit: Payer: Self-pay | Admitting: *Deleted

## 2022-03-06 DIAGNOSIS — F0390 Unspecified dementia without behavioral disturbance: Secondary | ICD-10-CM | POA: Diagnosis not present

## 2022-03-06 DIAGNOSIS — M6281 Muscle weakness (generalized): Secondary | ICD-10-CM | POA: Diagnosis not present

## 2022-03-06 DIAGNOSIS — J690 Pneumonitis due to inhalation of food and vomit: Secondary | ICD-10-CM | POA: Diagnosis not present

## 2022-03-06 DIAGNOSIS — I5022 Chronic systolic (congestive) heart failure: Secondary | ICD-10-CM | POA: Diagnosis not present

## 2022-03-06 DIAGNOSIS — J449 Chronic obstructive pulmonary disease, unspecified: Secondary | ICD-10-CM | POA: Diagnosis not present

## 2022-03-06 DIAGNOSIS — Z9181 History of falling: Secondary | ICD-10-CM | POA: Diagnosis not present

## 2022-03-06 DIAGNOSIS — J9621 Acute and chronic respiratory failure with hypoxia: Secondary | ICD-10-CM | POA: Diagnosis not present

## 2022-03-06 DIAGNOSIS — R296 Repeated falls: Secondary | ICD-10-CM | POA: Diagnosis not present

## 2022-03-06 DIAGNOSIS — G9341 Metabolic encephalopathy: Secondary | ICD-10-CM | POA: Diagnosis not present

## 2022-03-06 DIAGNOSIS — S22000A Wedge compression fracture of unspecified thoracic vertebra, initial encounter for closed fracture: Secondary | ICD-10-CM | POA: Diagnosis not present

## 2022-03-06 DIAGNOSIS — S2241XA Multiple fractures of ribs, right side, initial encounter for closed fracture: Secondary | ICD-10-CM | POA: Diagnosis not present

## 2022-03-06 DIAGNOSIS — J9622 Acute and chronic respiratory failure with hypercapnia: Secondary | ICD-10-CM | POA: Diagnosis not present

## 2022-03-06 DIAGNOSIS — I10 Essential (primary) hypertension: Secondary | ICD-10-CM | POA: Diagnosis not present

## 2022-03-06 DIAGNOSIS — S2241XD Multiple fractures of ribs, right side, subsequent encounter for fracture with routine healing: Secondary | ICD-10-CM | POA: Diagnosis not present

## 2022-03-06 NOTE — Patient Outreach (Signed)
  Care Coordination   03/06/2022 Name: Vergene Marland MRN: [id number] DOB: 24-Nov-1930   Care Coordination Outreach Attempts:  An unsuccessful telephone outreach was attempted today to offer the patient information about available care coordination services as a benefit of their health plan.   Follow Up Plan:  Additional outreach attempts will be made to offer the patient care coordination information and services.   Encounter Outcome:  No Answer  Care Coordination Interventions Activated:  No   Care Coordination Interventions:  No, not indicated   Eduard Clos MSW, LCSW Licensed Clinical Social Worker      [phone]

## 2022-03-06 NOTE — Patient Outreach (Addendum)
[name] resides in [facility]. [name] is active with [facility] care coordination/care management team. Followed by [facility] LCSW.   Writer spoke with Deseree, SNF SW at [facility] to make aware [name] is active with [facility] care coordination program. Discussed writer is following for transition plans and ongoing [facility] needs.   Telephone call made to daughter/DPR/HCPOA [name] [phone]. No answer. HIPAA compliant voicemail message left.   Addendum:  Returned telephone call from [place]. Discussed Probation officer is following and is available as a resource if needed. Provided contact information.    Alice Rolling, MSN, RN,BSN [place] Acute Care Coordinator [phone] [facility]) [phone]  (Toll free office)

## 2022-03-07 DIAGNOSIS — S2241XA Multiple fractures of ribs, right side, initial encounter for closed fracture: Secondary | ICD-10-CM | POA: Diagnosis not present

## 2022-03-07 DIAGNOSIS — J690 Pneumonitis due to inhalation of food and vomit: Secondary | ICD-10-CM | POA: Diagnosis not present

## 2022-03-07 DIAGNOSIS — J9622 Acute and chronic respiratory failure with hypercapnia: Secondary | ICD-10-CM | POA: Diagnosis not present

## 2022-03-07 DIAGNOSIS — M6281 Muscle weakness (generalized): Secondary | ICD-10-CM | POA: Diagnosis not present

## 2022-03-07 DIAGNOSIS — F0390 Unspecified dementia without behavioral disturbance: Secondary | ICD-10-CM | POA: Diagnosis not present

## 2022-03-07 DIAGNOSIS — G9341 Metabolic encephalopathy: Secondary | ICD-10-CM | POA: Diagnosis not present

## 2022-03-07 DIAGNOSIS — I10 Essential (primary) hypertension: Secondary | ICD-10-CM | POA: Diagnosis not present

## 2022-03-07 DIAGNOSIS — S22000A Wedge compression fracture of unspecified thoracic vertebra, initial encounter for closed fracture: Secondary | ICD-10-CM | POA: Diagnosis not present

## 2022-03-07 DIAGNOSIS — R296 Repeated falls: Secondary | ICD-10-CM | POA: Diagnosis not present

## 2022-03-08 ENCOUNTER — Telehealth: Payer: Self-pay | Admitting: *Deleted

## 2022-03-08 DIAGNOSIS — J449 Chronic obstructive pulmonary disease, unspecified: Secondary | ICD-10-CM

## 2022-03-08 DIAGNOSIS — I10 Essential (primary) hypertension: Secondary | ICD-10-CM

## 2022-03-08 DIAGNOSIS — M818 Other osteoporosis without current pathological fracture: Secondary | ICD-10-CM

## 2022-03-08 NOTE — Telephone Encounter (Signed)
  Care Management   Follow Up Note   03/08/2022 Name: Atlantis Delong MRN: [id number] DOB: [birth year]   Referred by: Tonia Ghent, MD Reason for referral : No chief complaint on file.   A second unsuccessful telephone outreach was attempted today. The patient was referred to the case management team for assistance with care management and care coordination.  Message left for both daughters to inform them of pt being transitioned to a new CCM team effective 03/09/22.  Follow Up Plan: The care management team will reach out to the patient again over the next 14 days.   Eduard Clos MSW, LCSW Licensed Clinical Social Worker [place]   [phone]

## 2022-03-09 DIAGNOSIS — J449 Chronic obstructive pulmonary disease, unspecified: Secondary | ICD-10-CM | POA: Diagnosis not present

## 2022-03-09 DIAGNOSIS — I5022 Chronic systolic (congestive) heart failure: Secondary | ICD-10-CM | POA: Diagnosis not present

## 2022-03-09 DIAGNOSIS — J9621 Acute and chronic respiratory failure with hypoxia: Secondary | ICD-10-CM | POA: Diagnosis not present

## 2022-03-09 DIAGNOSIS — Z9181 History of falling: Secondary | ICD-10-CM | POA: Diagnosis not present

## 2022-03-09 DIAGNOSIS — S2241XD Multiple fractures of ribs, right side, subsequent encounter for fracture with routine healing: Secondary | ICD-10-CM | POA: Diagnosis not present

## 2022-03-09 DIAGNOSIS — J690 Pneumonitis due to inhalation of food and vomit: Secondary | ICD-10-CM | POA: Diagnosis not present

## 2022-03-09 DIAGNOSIS — J9622 Acute and chronic respiratory failure with hypercapnia: Secondary | ICD-10-CM | POA: Diagnosis not present

## 2022-03-12 DIAGNOSIS — G9341 Metabolic encephalopathy: Secondary | ICD-10-CM | POA: Diagnosis not present

## 2022-03-12 DIAGNOSIS — F0390 Unspecified dementia without behavioral disturbance: Secondary | ICD-10-CM | POA: Diagnosis not present

## 2022-03-12 DIAGNOSIS — J690 Pneumonitis due to inhalation of food and vomit: Secondary | ICD-10-CM | POA: Diagnosis not present

## 2022-03-12 DIAGNOSIS — S2241XA Multiple fractures of ribs, right side, initial encounter for closed fracture: Secondary | ICD-10-CM | POA: Diagnosis not present

## 2022-03-12 DIAGNOSIS — S22000A Wedge compression fracture of unspecified thoracic vertebra, initial encounter for closed fracture: Secondary | ICD-10-CM | POA: Diagnosis not present

## 2022-03-12 DIAGNOSIS — R296 Repeated falls: Secondary | ICD-10-CM | POA: Diagnosis not present

## 2022-03-12 DIAGNOSIS — M6281 Muscle weakness (generalized): Secondary | ICD-10-CM | POA: Diagnosis not present

## 2022-03-12 DIAGNOSIS — J9622 Acute and chronic respiratory failure with hypercapnia: Secondary | ICD-10-CM | POA: Diagnosis not present

## 2022-03-12 DIAGNOSIS — I10 Essential (primary) hypertension: Secondary | ICD-10-CM | POA: Diagnosis not present

## 2022-03-13 DIAGNOSIS — J690 Pneumonitis due to inhalation of food and vomit: Secondary | ICD-10-CM | POA: Diagnosis not present

## 2022-03-13 DIAGNOSIS — Z9181 History of falling: Secondary | ICD-10-CM | POA: Diagnosis not present

## 2022-03-13 DIAGNOSIS — J9621 Acute and chronic respiratory failure with hypoxia: Secondary | ICD-10-CM | POA: Diagnosis not present

## 2022-03-13 DIAGNOSIS — S2241XD Multiple fractures of ribs, right side, subsequent encounter for fracture with routine healing: Secondary | ICD-10-CM | POA: Diagnosis not present

## 2022-03-13 DIAGNOSIS — J9622 Acute and chronic respiratory failure with hypercapnia: Secondary | ICD-10-CM | POA: Diagnosis not present

## 2022-03-13 DIAGNOSIS — I5022 Chronic systolic (congestive) heart failure: Secondary | ICD-10-CM | POA: Diagnosis not present

## 2022-03-13 DIAGNOSIS — J449 Chronic obstructive pulmonary disease, unspecified: Secondary | ICD-10-CM | POA: Diagnosis not present

## 2022-03-14 ENCOUNTER — Telehealth: Payer: Self-pay | Admitting: *Deleted

## 2022-03-14 DIAGNOSIS — J690 Pneumonitis due to inhalation of food and vomit: Secondary | ICD-10-CM | POA: Diagnosis not present

## 2022-03-14 DIAGNOSIS — S2241XA Multiple fractures of ribs, right side, initial encounter for closed fracture: Secondary | ICD-10-CM | POA: Diagnosis not present

## 2022-03-14 DIAGNOSIS — S22000A Wedge compression fracture of unspecified thoracic vertebra, initial encounter for closed fracture: Secondary | ICD-10-CM | POA: Diagnosis not present

## 2022-03-14 DIAGNOSIS — F0390 Unspecified dementia without behavioral disturbance: Secondary | ICD-10-CM | POA: Diagnosis not present

## 2022-03-14 DIAGNOSIS — M6281 Muscle weakness (generalized): Secondary | ICD-10-CM | POA: Diagnosis not present

## 2022-03-14 DIAGNOSIS — I10 Essential (primary) hypertension: Secondary | ICD-10-CM | POA: Diagnosis not present

## 2022-03-14 DIAGNOSIS — J9622 Acute and chronic respiratory failure with hypercapnia: Secondary | ICD-10-CM | POA: Diagnosis not present

## 2022-03-14 DIAGNOSIS — R296 Repeated falls: Secondary | ICD-10-CM | POA: Diagnosis not present

## 2022-03-14 DIAGNOSIS — G9341 Metabolic encephalopathy: Secondary | ICD-10-CM | POA: Diagnosis not present

## 2022-03-14 NOTE — Patient Outreach (Signed)
  Care Coordination   Follow Up Visit Note   03/14/2022 Name: Cniyah Sproull MRN: [id number] DOB: 23-Nov-1930  Michaeline Eckersley is a 88 y.o. year old female who sees Tonia Ghent, MD for primary care. I spoke with [name], daughter/HCPOA of  [name] by phone today.  What matters to the patients health and wellness today?  Currently in SNF- long term plan concerns.   Daughter/HCPOA contacted CSW today regarding concerns about SNF indicating pt to be released 03/16/22. Family has sent an appeal to [employer] and await their decision. Per daughter, she is improving with her therapies after a slow start at SNF due to still recovering from pneumonia. Family would like to get more SNF level therapies before she is released.  Daughter is also looking into ALF options  and arranging for some assessments to be done of pt at SNF to see if transitioning to ALF from SNF may be beneficial.  Encouraged daughter to continue seeking these options and provided her some additional ALF names to contact as pt may need this even if SNF appeal is won and she can remain longer. Long term, she may need ALF level of care going forward.     Goals Addressed   None     SDOH assessments and interventions completed:  No     Care Coordination Interventions Activated:  No  Care Coordination Interventions:  No, not indicated   Follow up plan: Follow up call scheduled for 03/16/22    Encounter Outcome:  Pt. Visit Completed   Eduard Clos MSW, LCSW Licensed Clinical Social Worker      [phone]

## 2022-03-15 ENCOUNTER — Telehealth: Payer: Self-pay | Admitting: Family Medicine

## 2022-03-15 DIAGNOSIS — R1310 Dysphagia, unspecified: Secondary | ICD-10-CM

## 2022-03-15 NOTE — Telephone Encounter (Signed)
Patient daughter called in and stated she is in [facility]. She stated that she is having trouble with swallowing and acid reflux. Stated that food and drink is getting stuck. She spoke with Dr. Collene Mares and she suggest that she should have her esophagus looked at. She was wondering if a referral could sent over to Dr. Lorie Apley office [facility] in [place]. Thank you!

## 2022-03-16 ENCOUNTER — Telehealth: Payer: Self-pay | Admitting: *Deleted

## 2022-03-16 DIAGNOSIS — J449 Chronic obstructive pulmonary disease, unspecified: Secondary | ICD-10-CM | POA: Diagnosis not present

## 2022-03-16 DIAGNOSIS — J690 Pneumonitis due to inhalation of food and vomit: Secondary | ICD-10-CM | POA: Diagnosis not present

## 2022-03-16 DIAGNOSIS — J9622 Acute and chronic respiratory failure with hypercapnia: Secondary | ICD-10-CM | POA: Diagnosis not present

## 2022-03-16 DIAGNOSIS — J9621 Acute and chronic respiratory failure with hypoxia: Secondary | ICD-10-CM | POA: Diagnosis not present

## 2022-03-16 DIAGNOSIS — Z9181 History of falling: Secondary | ICD-10-CM | POA: Diagnosis not present

## 2022-03-16 DIAGNOSIS — S2241XD Multiple fractures of ribs, right side, subsequent encounter for fracture with routine healing: Secondary | ICD-10-CM | POA: Diagnosis not present

## 2022-03-16 DIAGNOSIS — I5022 Chronic systolic (congestive) heart failure: Secondary | ICD-10-CM | POA: Diagnosis not present

## 2022-03-16 NOTE — Telephone Encounter (Signed)
Per note below left v/m for [facility] as instructed. With Dr Josefine Class notation on 03/16/22 and advised if any further questions or concerns to cb to [facility].

## 2022-03-16 NOTE — Telephone Encounter (Signed)
Pt daughter returned call . Stated message can be left on voicemail

## 2022-03-16 NOTE — Telephone Encounter (Signed)
I put in the referral but she needs to check with the MDs at the facility.  She may need an order from them. Thanks.

## 2022-03-16 NOTE — Telephone Encounter (Signed)
Left message on voicemail for patient's daughter to call the office back.

## 2022-03-16 NOTE — Patient Outreach (Signed)
  Care Coordination   Follow Up Visit Note   03/16/2022 Name: [name] MRN: [id number] DOB: [birth year]  [name] is a 88 y.o. year old female who sees Tonia Ghent, MD for primary care. I spoke with  daughter of [name] by phone today.  What matters to the patients health and wellness today?  SNF update- family appealed the SNF dc and won. Family working with ALF which also concurred pt needs more SNF rehab  before appropriate for ALF level of care.     Goals Addressed   None     SDOH assessments and interventions completed:  Yes     Care Coordination Interventions Activated:  Yes  Care Coordination Interventions:  Yes, provided   Follow up plan: Follow up call scheduled for 03/21/22    Encounter Outcome:  Pt. Visit Completed   Eduard Clos MSW, LCSW Licensed Clinical Social Worker      [phone]

## 2022-03-19 ENCOUNTER — Other Ambulatory Visit: Payer: Self-pay | Admitting: *Deleted

## 2022-03-19 NOTE — Patient Outreach (Signed)
[facility] Post- Acute Care Coordinator follow up. Mrs. Shetterly resides in [facility].  Met with Deseree, SNF SW. Transition plan is ALF. No anticipated dc date at this. Family recently won appeal.  Will continue to follow and touch base with daughter [name].    Marthenia Rolling, MSN, RN,BSN [place] Acute Care Coordinator [phone] [facility]) [phone]  (Toll free office)

## 2022-03-20 DIAGNOSIS — J449 Chronic obstructive pulmonary disease, unspecified: Secondary | ICD-10-CM | POA: Diagnosis not present

## 2022-03-20 DIAGNOSIS — I5022 Chronic systolic (congestive) heart failure: Secondary | ICD-10-CM | POA: Diagnosis not present

## 2022-03-20 DIAGNOSIS — Z9181 History of falling: Secondary | ICD-10-CM | POA: Diagnosis not present

## 2022-03-20 DIAGNOSIS — J9621 Acute and chronic respiratory failure with hypoxia: Secondary | ICD-10-CM | POA: Diagnosis not present

## 2022-03-20 DIAGNOSIS — S2241XD Multiple fractures of ribs, right side, subsequent encounter for fracture with routine healing: Secondary | ICD-10-CM | POA: Diagnosis not present

## 2022-03-20 DIAGNOSIS — J690 Pneumonitis due to inhalation of food and vomit: Secondary | ICD-10-CM | POA: Diagnosis not present

## 2022-03-20 DIAGNOSIS — J9622 Acute and chronic respiratory failure with hypercapnia: Secondary | ICD-10-CM | POA: Diagnosis not present

## 2022-03-21 ENCOUNTER — Ambulatory Visit: Payer: Self-pay | Admitting: *Deleted

## 2022-03-21 ENCOUNTER — Telehealth: Payer: Self-pay | Admitting: *Deleted

## 2022-03-21 NOTE — Patient Instructions (Signed)
Visit Information  Thank you for taking time to visit with me today. Please don't hesitate to contact me if I can be of assistance to you.   Following are the goals we discussed today:   Goals Addressed               This Visit's Progress     Find Help in My Community (pt-stated)        Timeframe:  Short-Term Goal Priority:  High Start Date:       01/12/22                     Expected End Date:                05/07/22 Follow Up Date 03/26/22   -HCPOA/daughter reports pt is still at SNF- has made good progress in the past week including not getting any narcotics for pain- (daughters had reported a significant misuse of pain meds prior to hospitalization.  -daughter is hopeful to appeal and get further auth to extend SNF stay another week- they continue to communicate with local ALF about hopeful acceptance once appropriate.  -daughter reports plans for pt to see GI Doctor next week due "swallow issues and excessive burping" -Encouraged daughter to continue to communicate and update ALF's that are considering her for input and long term planning -review [employer] benefits- call to seek info as needed -review [employer] and Attendants program for eligibility - begin a notebook of services in my neighborhood or community - call 211 when I need some help - follow-up on any referrals for help I am given - think ahead to make sure my need does not become an emergency - make a note about what I need to have by the phone or take with me, like an identification card or social security number have a back-up plan - have a back-up plan - make a list of family or friends that I can call    Why is this important?   Knowing how and where to find help for yourself or family in your neighborhood and community is an important skill.  You will want to take some steps to learn how.    Notes:         Our next appointment is by telephone on 03/26/22    Please call the care guide team at  [phone] if you need to cancel or reschedule your appointment.   If you are experiencing a Mental Health or [place] or need someone to talk to, please call the [place] National Suicide Prevention Lifeline: 316-201-9415 or TTY: 563-186-5061 TTY (763) 145-7753) to talk to a trained counselor call 911   Patient verbalizes understanding of instructions and care plan provided today and agrees to view in [place]. Active MyChart status and patient understanding of how to access instructions and care plan via MyChart confirmed with patient.     Telephone follow up appointment with care management team member scheduled for:03/26/22  Eduard Clos MSW, LCSW Licensed Clinical Social Worker      [phone]

## 2022-03-21 NOTE — Patient Outreach (Signed)
  Care Coordination   Follow Up Visit Note   03/21/2022 Name: [name] MRN: [id number] DOB: 25-May-1931  [name] is a 88 y.o. year old female who sees Alice Ghent, MD for primary care. I spoke with  HCPOA/daughter of [name] by phone today.  What matters to the patients health and wellness today?  At SNF/post SNF plans.    Goals Addressed               This Visit's Progress     Find Help in My Community (pt-stated)        Timeframe:  Short-Term Goal Priority:  High Start Date:       01/12/22                     Expected End Date:                05/07/22 Follow Up Date 03/26/22   -HCPOA/daughter reports pt is still at SNF- has made good progress in the past week including not getting any narcotics for pain- (daughters had reported a significant misuse of pain meds prior to hospitalization.  -daughter is hopeful to appeal and get further auth to extend SNF stay another week- they continue to communicate with local ALF about hopeful acceptance once appropriate.  -daughter reports plans for pt to see GI Doctor next week due "swallow issues and excessive burping" -Encouraged daughter to continue to communicate and update ALF's that are considering her for input and long term planning -review [employer] benefits- call to seek info as needed -review [employer] program for eligibility - begin a notebook of services in my neighborhood or community - call 211 when I need some help - follow-up on any referrals for help I am given - think ahead to make sure my need does not become an emergency - make a note about what I need to have by the phone or take with me, like an identification card or social security number have a back-up plan - have a back-up plan - make a list of family or friends that I can call    Why is this important?   Knowing how and where to find help for yourself or family in your neighborhood and community is an  important skill.  You will want to take some steps to learn how.    Notes:         SDOH assessments and interventions completed:  Yes     Care Coordination Interventions Activated:  Yes  Care Coordination Interventions:  Yes, provided   Follow up plan: No further intervention required.   Encounter Outcome:  Pt. Visit Completed    [name] MSW, LCSW Licensed Clinical Social Worker      [phone]

## 2022-03-22 NOTE — Patient Outreach (Signed)
Error - duplicate

## 2022-03-23 ENCOUNTER — Other Ambulatory Visit: Payer: Self-pay | Admitting: Family Medicine

## 2022-03-23 DIAGNOSIS — Z9181 History of falling: Secondary | ICD-10-CM | POA: Diagnosis not present

## 2022-03-23 DIAGNOSIS — J690 Pneumonitis due to inhalation of food and vomit: Secondary | ICD-10-CM | POA: Diagnosis not present

## 2022-03-23 DIAGNOSIS — J449 Chronic obstructive pulmonary disease, unspecified: Secondary | ICD-10-CM | POA: Diagnosis not present

## 2022-03-23 DIAGNOSIS — S2241XD Multiple fractures of ribs, right side, subsequent encounter for fracture with routine healing: Secondary | ICD-10-CM | POA: Diagnosis not present

## 2022-03-23 DIAGNOSIS — I5022 Chronic systolic (congestive) heart failure: Secondary | ICD-10-CM | POA: Diagnosis not present

## 2022-03-23 DIAGNOSIS — J9621 Acute and chronic respiratory failure with hypoxia: Secondary | ICD-10-CM | POA: Diagnosis not present

## 2022-03-23 DIAGNOSIS — J9622 Acute and chronic respiratory failure with hypercapnia: Secondary | ICD-10-CM | POA: Diagnosis not present

## 2022-03-26 ENCOUNTER — Ambulatory Visit: Payer: Self-pay | Admitting: *Deleted

## 2022-03-26 NOTE — Patient Outreach (Signed)
  Care Coordination   Follow Up Visit Note   03/26/2022 Name: [name] MRN: [id number] DOB: Feb 07, 1931  [name] is a 88 y.o. year old female who sees Tonia Ghent, MD for primary care. I spoke with  [name], daughter/HCPOA of [name] by phone today.  What matters to the patients health and wellness today?  Planning transfer from SNF to ALF later this week.     Goals Addressed               This Visit's Progress     Find Help in My Community (pt-stated)        Timeframe:  Short-Term Goal Priority:  High Start Date:       01/12/22                     Expected End Date:                05/07/22 Follow Up Date 03/26/22   -[facility]OA[facility] [facility] and pt will likely be moving from SNF to ALF there later this week. Per daughter, she will coming to town and assist with the move.  Family is preparing to make her ALF room "mimic home" with some items from home (pictures, blankets, etc).   -plans for pt to see GI Doctor this week due "swallow issues and excessive burping"  -review [employer] benefits- call to seek info as needed -review [employer] program for eligibility - begin a notebook of services in my neighborhood or community - call 211 when I need some help - follow-up on any referrals for help I am given - think ahead to make sure my need does not become an emergency - make a note about what I need to have by the phone or take with me, like an identification card or social security number have a back-up plan - have a back-up plan - make a list of family or friends that I can call    Why is this important?   Knowing how and where to find help for yourself or family in your neighborhood and community is an important skill.  You will want to take some steps to learn how.    Notes:         SDOH assessments and interventions completed:  Yes     Care Coordination  Interventions Activated:  Yes  Care Coordination Interventions:  Yes, provided   Follow up plan: Follow up call scheduled for 04/09/22    Encounter Outcome:  Pt. Visit Completed   Eduard Clos MSW, LCSW Licensed Clinical Social Worker      [phone]

## 2022-03-26 NOTE — Patient Instructions (Signed)
Visit Information  Thank you for taking time to visit with me today. Please don't hesitate to contact me if I can be of assistance to you.   Following are the goals we discussed today:   Goals Addressed               This Visit's Progress     Find Help in My Community (pt-stated)        Timeframe:  Short-Term Goal Priority:  High Start Date:       01/12/22                     Expected End Date:                05/07/22 Follow Up Date 03/26/22   -[facility] [facility] and pt will likely be moving from SNF to ALF there later this week. Per daughter, she will coming to town and assist with the move.  Family is preparing to make her ALF room "mimic home" with some items from home (pictures, blankets, etc).   -plans for pt to see GI Doctor this week due "swallow issues and excessive burping"  -review [employer] benefits- call to seek info as needed -review [employer] program for eligibility - begin a notebook of services in my neighborhood or community - call 211 when I need some help - follow-up on any referrals for help I am given - think ahead to make sure my need does not become an emergency - make a note about what I need to have by the phone or take with me, like an identification card or social security number have a back-up plan - have a back-up plan - make a list of family or friends that I can call    Why is this important?   Knowing how and where to find help for yourself or family in your neighborhood and community is an important skill.  You will want to take some steps to learn how.    Notes:         Our next appointment is by telephone on 04/09/22    Please call the care guide team at [phone] if you need to cancel or reschedule your appointment.   If you are experiencing a Mental Health or [place] or need someone to talk to, please call 911      Patient verbalizes  understanding of instructions and care plan provided today and agrees to view in [place]. Active MyChart status and patient understanding of how to access instructions and care plan via MyChart confirmed with patient.     Telephone follow up appointment with care management team member scheduled for:04/09/22  Eduard Clos MSW, LCSW Licensed Clinical Social Worker      [phone]

## 2022-03-27 DIAGNOSIS — J690 Pneumonitis due to inhalation of food and vomit: Secondary | ICD-10-CM | POA: Diagnosis not present

## 2022-03-27 DIAGNOSIS — K5730 Diverticulosis of large intestine without perforation or abscess without bleeding: Secondary | ICD-10-CM | POA: Diagnosis not present

## 2022-03-27 DIAGNOSIS — R634 Abnormal weight loss: Secondary | ICD-10-CM | POA: Diagnosis not present

## 2022-03-27 DIAGNOSIS — I5022 Chronic systolic (congestive) heart failure: Secondary | ICD-10-CM | POA: Diagnosis not present

## 2022-03-27 DIAGNOSIS — J9622 Acute and chronic respiratory failure with hypercapnia: Secondary | ICD-10-CM | POA: Diagnosis not present

## 2022-03-27 DIAGNOSIS — K219 Gastro-esophageal reflux disease without esophagitis: Secondary | ICD-10-CM | POA: Diagnosis not present

## 2022-03-27 DIAGNOSIS — S2241XD Multiple fractures of ribs, right side, subsequent encounter for fracture with routine healing: Secondary | ICD-10-CM | POA: Diagnosis not present

## 2022-03-27 DIAGNOSIS — Z9181 History of falling: Secondary | ICD-10-CM | POA: Diagnosis not present

## 2022-03-27 DIAGNOSIS — R1310 Dysphagia, unspecified: Secondary | ICD-10-CM | POA: Diagnosis not present

## 2022-03-27 DIAGNOSIS — J9621 Acute and chronic respiratory failure with hypoxia: Secondary | ICD-10-CM | POA: Diagnosis not present

## 2022-03-27 DIAGNOSIS — J449 Chronic obstructive pulmonary disease, unspecified: Secondary | ICD-10-CM | POA: Diagnosis not present

## 2022-03-28 ENCOUNTER — Telehealth: Payer: Self-pay

## 2022-03-28 DIAGNOSIS — I10 Essential (primary) hypertension: Secondary | ICD-10-CM | POA: Diagnosis not present

## 2022-03-28 DIAGNOSIS — J690 Pneumonitis due to inhalation of food and vomit: Secondary | ICD-10-CM | POA: Diagnosis not present

## 2022-03-28 DIAGNOSIS — G9341 Metabolic encephalopathy: Secondary | ICD-10-CM | POA: Diagnosis not present

## 2022-03-28 DIAGNOSIS — M6281 Muscle weakness (generalized): Secondary | ICD-10-CM | POA: Diagnosis not present

## 2022-03-28 DIAGNOSIS — J9622 Acute and chronic respiratory failure with hypercapnia: Secondary | ICD-10-CM | POA: Diagnosis not present

## 2022-03-28 DIAGNOSIS — R296 Repeated falls: Secondary | ICD-10-CM | POA: Diagnosis not present

## 2022-03-28 DIAGNOSIS — S22000A Wedge compression fracture of unspecified thoracic vertebra, initial encounter for closed fracture: Secondary | ICD-10-CM | POA: Diagnosis not present

## 2022-03-28 DIAGNOSIS — S2241XA Multiple fractures of ribs, right side, initial encounter for closed fracture: Secondary | ICD-10-CM | POA: Diagnosis not present

## 2022-03-28 DIAGNOSIS — F0390 Unspecified dementia without behavioral disturbance: Secondary | ICD-10-CM | POA: Diagnosis not present

## 2022-03-28 NOTE — Chronic Care Management (AMB) (Signed)
Chronic Care Management Pharmacy Assistant   Name: [name]  MRN: [id number] DOB: 1931/07/06   Reason for Encounter: Reminder Call   Conditions to be addressed/monitored: CAD, HTN, HLD, and COPD   Recent office visits:  03/26/22-Alice Marcelline Deist, LCSW(fam med)-Telephone call--HCPOA/daughter [facility] [facility] and pt will likely be moving from SNF to ALF there later this week.  Recent consult visits:  02/02/22-Alice Nitka,MD(orthopedic)- no data   Hospital visits:  Medication Reconciliation was completed by comparing discharge summary, patient's EMR and Pharmacy list, and upon discussion with patient.  Admitted to the hospital on 02/20/22 due to Acute Respiratory failure. Discharge date was 02/26/22. Discharged from [place]?Medications Started at [facility] Discharge:?? -started Aspirin EC  Carvedilol Lisinopril  Medications Discontinued at Hospital Discharge: -Stopped  methocarbamol  Medications that remain the same after Hospital Discharge:??  -All other medications will remain the same.    02/18/22-[place] ED- Fall, The initial plan is to XR, analgesics, labs, low suspicion for any acute compression fracture. Admission was considered, however patient's pain is well controlled, she feels essentially at her baseline.  She lives at assisted care facility.  Plan discharge back home with close outpatient follow-up. F/u with spine specialist. She again has no spinous process ttp with palp/percussion today.  02/13/22-[place] ED- Fall,Hypokalemia-CT head and C-spine were obtained that did not show any injury.  X-ray of the pelvis was obtained to evaluate for hip fracture which was negative.  Chest x-ray did not show injury either. EKG obtained to evaluate for arrhythmia that did not show abnormality. Patient was then discharged back to her daughter after her reassuring workup. 02/09/22-[facility]  [place] -hallucinations-UA ordered and ultimately has no signs concerning for UTI.Medically I feel patient should have been evaluated further and most importantly needed EMS transport due to lack of supplemental oxygen supply and my recommendation after today's visit continues to be emergency department assessment.  Recommend follow-up if symptoms persist.  Medications: Outpatient Encounter Medications as of 03/28/2022  Medication Sig   acetaminophen (TYLENOL) 500 MG tablet Take 1,000 mg by mouth every 6 (six) hours as needed for mild pain or headache.   albuterol (PROAIR HFA) 108 (90 Base) MCG/ACT inhaler Inhale 1-2 puffs into the lungs every 6 (six) hours as needed for wheezing or shortness of breath (Okay to fill with Ventolin or albuterol HFA).   alendronate (FOSAMAX) 70 MG tablet Take 1 tablet (70 mg total) by mouth every Monday.   ALPRAZolam (XANAX) 0.5 MG tablet Take 1 tablet (0.5 mg total) by mouth daily as needed for anxiety or sleep.   anastrozole (ARIMIDEX) 1 MG tablet Take 1 tablet (1 mg total) by mouth daily.   aspirin EC 81 MG tablet Take 1 tablet (81 mg total) by mouth daily. Swallow whole.   busPIRone (BUSPAR) 5 MG tablet TAKE ONE TABLET BY MOUTH IN THE MORNING AND IN THE EVENING (Patient taking differently: Take 5 mg by mouth 3 (three) times daily.)   Calcium Carbonate (CALTRATE 600 PO) Take 600 mg by mouth at bedtime.   carvedilol (COREG) 3.125 MG tablet Take 1 tablet (3.125 mg total) by mouth 2 (two) times daily with a meal.   cholecalciferol (VITAMIN D3) 25 MCG (1000 UNIT) tablet Take 1,000 Units by mouth at bedtime.   donepezil (ARICEPT) 5 MG tablet TAKE ONE TABLET BY MOUTH AT BEDTIME   DULoxetine (CYMBALTA) 60 MG capsule TAKE ONE  CAPSULE BY MOUTH DAILY   Ensure (ENSURE) Take 237 mLs by mouth 2 (two) times daily between meals.   ferrous sulfate (FERROUSUL) 325 (65 FE) MG tablet Take 1 tablet (325 mg total) by mouth daily with breakfast.   fluticasone (FLONASE) 50 MCG/ACT nasal  spray PLACE 2 SPRAYS INTO BOTH NOSTRILS DAILY. (Patient taking differently: Place 2 sprays into both nostrils daily as needed for allergies.)   ipratropium-albuterol (DUONEB) 0.5-2.5 (3) MG/3ML SOLN Take 3 mLs by nebulization 2 (two) times daily.   levothyroxine (SYNTHROID) 112 MCG tablet TAKE ONE TABLET BY MOUTH DAILY (Patient taking differently: Take 112 mcg by mouth daily before breakfast.)   lidocaine (LIDODERM) 5 % Place 1 patch onto the skin daily as needed. Remove & Discard patch within 12 hours or as directed by MD   lisinopril (ZESTRIL) 20 MG tablet Take 1 tablet (20 mg total) by mouth daily.   Multiple Vitamin (MULTIVITAMIN WITH MINERALS) TABS tablet Take 1 tablet by mouth daily.   Multiple Vitamins-Minerals (OCUVITE PRESERVISION PO) Take 1 tablet by mouth at bedtime.   nitroGLYCERIN (NITROSTAT) 0.4 MG SL tablet Place 1 tablet (0.4 mg total) under the tongue every 5 (five) minutes as needed for chest pain.   OXYGEN Inhale 3-3.5 L into the lungs continuous.   pantoprazole (PROTONIX) 20 MG tablet TAKE ONE TABLET BY MOUTH TWICE DAILY (Patient taking differently: Take 20 mg by mouth 2 (two) times daily.)   rosuvastatin (CRESTOR) 10 MG tablet Take 1 tablet (10 mg total) by mouth daily.   senna-docusate (SENOKOT-S) 8.6-50 MG tablet Take 1 tablet by mouth at bedtime.   SPIRIVA HANDIHALER 18 MCG inhalation capsule INHALE THE CONTENTS OF ONE CAPSULE VIA HANDIHALER DAILY (Patient taking differently: Place 18 mcg into inhaler and inhale daily.)   traZODone (DESYREL) 50 MG tablet Take 2 tablets (100 mg total) by mouth at bedtime.   [DISCONTINUED] bisoprolol (ZEBETA) 5 MG tablet Take 0.5 tablets (2.5 mg total) by mouth daily.   No facility-administered encounter medications on file as of 03/28/2022.  [name] was contacted to remind of upcoming telephone visit with [name] on 04/02/22 at 8:45. Patient was reminded to have any blood glucose and blood pressure readings available for  review at appointment.   Message was left reminding patient of appointment. Left msg for daughter [name], phone # same for the patient    CCM referral has been placed prior to visit?  Yes   Star Rating Drugs: Medication:  Last Fill: Day Supply Lisinopril '20mg'$  02/27/22 30 Rosuvastatin '10mg'$  02/27/22 30 Statin adherence gap report   [name], CPP notified  Avel Sensor, [place]  [phone]

## 2022-03-29 ENCOUNTER — Other Ambulatory Visit: Payer: Self-pay | Admitting: *Deleted

## 2022-03-29 NOTE — Patient Outreach (Signed)
[facility] Post- Acute Care Coordinator follow up. Verified in [facility] Mrs. Ringler discharged from [facility] on 03/28/22.  Verified with [place], SNF SW Mrs. Ginsberg transitioned to [facility] in [place].   [facility] Care Coordination team aware.    Marthenia Rolling, MSN, RN,BSN [place] Acute Care Coordinator [phone] (Direct dial)

## 2022-04-02 ENCOUNTER — Telehealth: Payer: Self-pay | Admitting: Pharmacist

## 2022-04-02 ENCOUNTER — Telehealth: Payer: Medicare PPO

## 2022-04-02 NOTE — Telephone Encounter (Signed)
Spoke with patient's daughter [name] [facility]), she reports they transported Ms Flesch to ALF ([facility] in [place]) over the weekend and they are managing her medications at this time. Pt is doing reasonably well though "not too happy" about being in ALF, family is trying to ease the transition. They deny any medication needs at this time.  Patient has follow up with with PCP tomorrow and [name] plans on accompanying patient. Nothing further needed at this time.

## 2022-04-02 NOTE — Progress Notes (Deleted)
Chronic Care Management Pharmacy Note  04/02/2022 Name:  [name] MRN:  [id number] DOB:  1930/12/03  Summary: CCM F/U visit -Family has concerns about medication management in the home - currently they are using [facility] ([place] out of [place], not preferred with insurance plan); Discussed benefits of medication synchronization, packaging and delivery as well as enhanced pharmacist oversight with [facility]. Family to discuss optimizing pharmacy with patient and will contact PharmD if they wish to proceed.  Recommendations/Changes made from today's visit: -No med changes  Plan: -[place] will call patient within 2 weeks to check on pharmacy status -Pharmacist follow up televisit scheduled for 2 months -PCP 02/20/22    Subjective: [name] is an 88 y.o. year old female who is a primary patient of Damita Dunnings, Elveria Rising, MD.  The CCM team was consulted for assistance with disease management and care coordination needs.    Engaged with patient by telephone for follow up visit in response to provider referral for pharmacy case management and/or care coordination services.   Consent to Services:  The patient was given information about Chronic Care Management services, agreed to services, and gave verbal consent prior to initiation of services.  Please see initial visit note for detailed documentation.   Patient Care Team: Tonia Ghent, MD as PCP - General Tamala Julian Lynnell Dike, MD as PCP - Cardiology (Cardiology) Magrinat, Virgie Dad, MD (Inactive) as Consulting Physician (Oncology) [name], MD as Consulting Physician (Otolaryngology) Marshell Garfinkel, MD as Consulting Physician (Pulmonary Disease) Deirdre Peer, LCSW as Social Worker Charlton Haws, [facility] as Pharmacist (Pharmacist)  Patient transitioned to [facility] on 03/28/22.  Recent office visits: 11/02/21 Dr Damita Dunnings OV: hospital f/u - decrease levothyroxine to  112 mcg.  Recent consult visits: 03/26/22-Janet Marcelline Deist, LCSW(fam med)-Telephone call--HCPOA/daughter reports pt is still at [facility] is assessing pt and pt will likely be moving from SNF to ALF there later this week.  02/09/22 UC visit: hallucination/confusion. Concern for polypharmacy. Lack of supplemental O2. Rec ED evaluation but patient/family/EMS did not.  01/05/22 Dr Trula Slade (Vasc Surg): new pt - AAA. Not a good surgical candidate. Rec 1 year f/u. Ok for kyphoplasty.  12/28/21 Dr Louanne Skye (Ortho surg): compression fracture; ordered repeat CT. Repeat BMD later. Increase hydrocodone to 10 mg q4-6hr. RTC 2 weeks.Marland Kitchen  Hospital visits: 02/20/22 - 02/26/22 Admission ([facility]): acute on chronic respiratory failure, pneumonia. Requiring BiPAP. DC to SNF.  02/18/22 ED visit ([facility]): fall - mild rib fracture. D/C home with ibuprofen, lidocaine patch, methocarbamol.  02/13/22 ED visit ([facility]): fall - delayed kyphoplasty. Imaging reassuring. D/C home.  01/28/22 ED visit ([facility]): generalized weakness - f/u with PCP.  12/21/21 ED visit ([facility]): compression fracture - f/u with neurosurgery 1 week.  12/06/21 - 12/13/21 admission ([facility]): COPD exacerbation and AMS. Reduced Xanax dose. PT rec'd SNF.  09/25/21 - 09/27/21 admission [facility]) : found down w/ AMS. No acute findings. Rec neuropsych eval outpatient. Rec ILF/ALF environment.  08/30/21 ED visit [facility]): fall - CT head WNL.    Objective:  Lab Results  Component Value Date   CREATININE 0.49 02/25/2022   BUN 19 02/25/2022   GFR 81.66 11/02/2021   GFRNONAA >60 02/25/2022   GFRAA >60 09/14/2019   NA 139 02/25/2022   K 3.7 02/25/2022   CALCIUM 8.6 (L) 02/25/2022   CO2 32 02/25/2022   GLUCOSE 123 (H) 02/25/2022    Lab Results  Component Value Date/Time   HGBA1C 5.6 01/30/2019  05:39 AM   HGBA1C 5.6 01/29/2019 12:52 PM   GFR 81.66 11/02/2021 03:57 PM   GFR 80.86 11/03/2020 12:50 PM   MICROALBUR 0.4 05/24/2009 09:36 AM   MICROALBUR 1.3 11/26/2008 09:27  AM    Last diabetic Eye exam: No results found for: "HMDIABEYEEXA"  Last diabetic Foot exam: No results found for: "HMDIABFOOTEX"   Lab Results  Component Value Date   CHOL 125 11/02/2021   HDL 52.00 11/02/2021   LDLCALC 60 11/02/2021   LDLDIRECT 157.9 03/12/2007   TRIG 65.0 11/02/2021   CHOLHDL 2 11/02/2021       Latest Ref Rng & Units 02/21/2022    3:22 AM 02/20/2022    4:00 PM 02/13/2022    6:55 PM  Hepatic Function  Total Protein 6.5 - 8.1 g/dL 6.2  7.0  6.5   Albumin 3.5 - 5.0 g/dL 3.0  3.6  3.3   AST 15 - 41 U/L _0 ALT 0 - 44 U/L _1 Alk Phosphatase 38 - 126 U/L 37  45  37   Total Bilirubin 0.3 - 1.2 mg/dL 0.8  0.7  0.5     Lab Results  Component Value Date/Time   TSH 0.067 (L) 12/06/2021 08:45 PM   TSH 0.29 (L) 11/02/2021 03:57 PM   TSH 10.31 (H) 11/03/2020 12:50 PM   FREET4 0.80 03/02/2013 11:52 AM   FREET4 0.8 05/24/2009 09:36 AM       Latest Ref Rng & Units 02/23/2022    5:39 AM 02/22/2022    2:39 AM 02/21/2022    3:22 AM  CBC  WBC 4.0 - 10.5 K/uL 5.4  4.1  4.6   Hemoglobin 12.0 - 15.0 g/dL 11.8  9.3  9.8   Hematocrit 36.0 - 46.0 % 36.6  29.6  32.5   Platelets 150 - 400 K/uL 175  128  132     Iron/TIBC/Ferritin/ %Sat    Component Value Date/Time   IRON 56 07/10/2020 0335   TIBC 297 07/10/2020 0335   FERRITIN 32 07/11/2020 0416   IRONPCTSAT 19 07/10/2020 0335   IRONPCTSAT 12 (L) 08/22/2018 1625    Lab Results  Component Value Date/Time   VD25OH 32.12 11/02/2021 03:57 PM   VD25OH 31.08 11/03/2020 12:50 PM    Clinical ASCVD: Yes  The ASCVD Risk score (Arnett DK, et al., 2019) failed to calculate for the following reasons:   The 2019 ASCVD risk score is only valid for ages 23 to 54   The patient has a prior MI or stroke diagnosis       11/02/2021    3:06 PM 07/19/2020    2:44 PM 07/19/2020   10:41 AM  Depression screen PHQ 2/9  Decreased Interest 0 0 0  Down, Depressed, Hopeless 0 0 1  PHQ - 2 Score 0 0 1      Social  History   Tobacco Use  Smoking Status Former   Packs/day: 1.50   Years: 10.00   Total pack years: 15.00   Types: Cigarettes   Quit date: 07/09/1985   Years since quitting: 36.7  Smokeless Tobacco Never  Tobacco Comments   1 1/2 ppd x 20 years   BP Readings from Last 3 Encounters:  02/26/22 134/77  02/19/22 (!) 138/56  02/13/22 (!) 157/56   Pulse Readings from Last 3 Encounters:  02/26/22 84  02/19/22 89  02/13/22 77   Wt Readings from Last 3 Encounters:  02/20/22  124 lb (56.2 kg)  02/18/22 115 lb 1.3 oz (52.2 kg)  01/28/22 115 lb (52.2 kg)   BMI Readings from Last 3 Encounters:  02/20/22 23.43 kg/m  02/18/22 21.74 kg/m  01/28/22 21.73 kg/m    Assessment/Interventions: Review of patient past medical history, allergies, medications, health status, including review of consultants reports, laboratory and other test data, was performed as part of comprehensive evaluation and provision of chronic care management services.   SDOH:  (Social Determinants of Health) assessments and interventions performed: No - done June 2023 SDOH Interventions    Flowsheet Row Chronic Care Management from 01/12/2022 in [place] at [place] Management from 05/17/2021 in [place] at [place] Management from 04/11/2021 in [place] at [place] Interventions -- Intervention Not Indicated --  Transportation Interventions Intervention Not Indicated -- --  Financial Strain Interventions -- -- Other (Comment)  [Cost assistance]  Physical Activity Interventions Other (Comments)  [PCP to place orders with [facility] for PT/OT] -- --  Social Connections Interventions -- Other (Comment)  [daughter reports pt will not agree to go to any day programs] --      SDOH Screenings   Food Insecurity: No Food Insecurity (05/17/2021)  Transportation Needs: No Transportation Needs (01/13/2022)  Depression (PHQ2-9): Low  Risk  (11/02/2021)  Financial Resource Strain: High Risk (04/11/2021)  Physical Activity: Inactive (01/13/2022)  Social Connections: Moderately Isolated (05/17/2021)  Tobacco Use: Medium Risk (02/20/2022)    CCM Care Plan  Allergies  Allergen Reactions   Doxycycline Other (See Comments)    GI upset   Sulfadiazine Other (See Comments)    Fever, aches   Sulfa Antibiotics Rash    Medications Reviewed Today     Reviewed by Harvel Ricks, CPhT (Pharmacy Technician) on 02/21/22 at 29  Med List Status: Complete   Medication Order Taking? Sig Documenting Provider Last Dose Status Informant  acetaminophen (TYLENOL) 500 MG tablet [id number] Yes Take 1,000 mg by mouth every 6 (six) hours as needed for mild pain or headache. [provider] Past Week Active Child  albuterol (PROAIR HFA) 108 (90 Base) MCG/ACT inhaler [id number] Yes Inhale 1-2 puffs into the lungs every 6 (six) hours as needed for wheezing or shortness of breath (Okay to fill with Ventolin or albuterol HFA). Tonia Ghent, MD 02/19/2022 Active Child  alendronate (FOSAMAX) 70 MG tablet [id number] Yes Take 1 tablet (70 mg total) by mouth every Monday. Benay Pike, MD 02/12/2022 Active Child  ALPRAZolam (XANAX) 0.5 MG tablet [id number] Yes Take 0.5 mg by mouth daily as needed for anxiety or sleep. [provider] 02/19/2022 Active Child  anastrozole (ARIMIDEX) 1 MG tablet [id number] Yes Take 1 tablet (1 mg total) by mouth daily. Magrinat, Virgie Dad, MD 02/19/2022 Active Child  busPIRone (BUSPAR) 5 MG tablet [id number] Yes TAKE ONE TABLET BY MOUTH IN THE MORNING AND IN THE EVENING  Patient taking differently: Take 5 mg by mouth 3 (three) times daily.   Tonia Ghent, MD 02/19/2022 Active Child  Calcium Carbonate (CALTRATE 600 PO) [id number] Yes Take 600 mg by mouth at bedtime. [provider] 02/19/2022 Active Child  cholecalciferol (VITAMIN D3) 25 MCG (1000 UNIT) tablet [id number] Yes Take 1,000 Units by mouth  at bedtime. [provider] 02/19/2022 Active Child  donepezil (ARICEPT) 5 MG tablet [id number] Yes TAKE ONE TABLET BY MOUTH AT BEDTIME Tonia Ghent, MD 02/19/2022 Active Child  DULoxetine (CYMBALTA) 60 MG  capsule [id number] Yes TAKE ONE CAPSULE BY MOUTH DAILY  Patient taking differently: Take 60 mg by mouth daily.   Tonia Ghent, MD 02/19/2022 Active Child  Ensure (ENSURE) [id number] Yes Take 237 mLs by mouth 2 (two) times daily between meals. [provider] 02/20/2022 Active Child  ferrous sulfate (FERROUSUL) 325 (65 FE) MG tablet [id number] Yes Take 1 tablet (325 mg total) by mouth daily with breakfast. Tonia Ghent, MD 02/19/2022 Active Child  fluticasone (FLONASE) 50 MCG/ACT nasal spray [id number] Yes PLACE 2 SPRAYS INTO BOTH NOSTRILS DAILY.  Patient taking differently: Place 2 sprays into both nostrils daily as needed for allergies.   Tonia Ghent, MD 02/19/2022 Active Child  ipratropium-albuterol (DUONEB) 0.5-2.5 (3) MG/3ML SOLN [id number] Yes Take 3 mLs by nebulization 2 (two) times daily. Florencia Reasons, MD 02/19/2022 Active Child  levothyroxine (SYNTHROID) 112 MCG tablet [id number] Yes TAKE ONE TABLET BY MOUTH DAILY  Patient taking differently: Take 112 mcg by mouth daily before breakfast.   Tonia Ghent, MD 02/19/2022 Active Child  lidocaine (LIDODERM) 5 % [id number] Yes Place 1 patch onto the skin daily as needed. Remove & Discard patch within 12 hours or as directed by MD Jeanell Sparrow, DO 02/19/2022 Active Child  methocarbamol (ROBAXIN) 500 MG tablet [id number] Yes Take 1 tablet (500 mg total) by mouth 2 (two) times daily for 5 days. Jeanell Sparrow, DO 02/19/2022 Active Child  Multiple Vitamin (MULTIVITAMIN WITH MINERALS) TABS tablet [id number] Yes Take 1 tablet by mouth daily. [provider] 02/19/2022 Active Child  Multiple Vitamins-Minerals (OCUVITE PRESERVISION PO) [id number] Yes Take 1 tablet by mouth at bedtime. [provider] 02/19/2022 Active  Child  nitroGLYCERIN (NITROSTAT) 0.4 MG SL tablet [id number] Yes Place 1 tablet (0.4 mg total) under the tongue every 5 (five) minutes as needed for chest pain. Belva Crome, MD prn Active Child  OXYGEN [id number] Yes Inhale 3-3.5 L into the lungs continuous. [provider] cont Active Child  pantoprazole (PROTONIX) 20 MG tablet [id number] Yes TAKE ONE TABLET BY MOUTH TWICE DAILY  Patient taking differently: Take 20 mg by mouth 2 (two) times daily.   Tonia Ghent, MD 02/19/2022 Active Child  rosuvastatin (CRESTOR) 10 MG tablet [id number] Yes Take 1 tablet (10 mg total) by mouth daily. Tonia Ghent, MD 02/19/2022 Active Child  senna-docusate (SENOKOT-S) 8.6-50 MG tablet [id number] Yes Take 1 tablet by mouth at bedtime. Florencia Reasons, MD Past Week Active Child  SPIRIVA HANDIHALER 18 MCG inhalation capsule [id number] Yes INHALE THE CONTENTS OF ONE CAPSULE VIA HANDIHALER DAILY  Patient taking differently: Place 18 mcg into inhaler and inhale daily.   Tonia Ghent, MD 02/19/2022 Active Child  traZODone (DESYREL) 50 MG tablet [id number] Yes Take 2 tablets (100 mg total) by mouth at bedtime. Tonia Ghent, MD 02/19/2022 Active Child  Med List Note Otilio Connors, CPhT 09/25/21 1816): [name] (Daughter) at [phone]             Patient Active Problem List   Diagnosis Date Noted   Confusion 02/21/2022   Acute on chronic respiratory failure with hypoxia and hypercapnia (HCC) 02/20/2022   Aspiration pneumonia of right lower lobe (HCC) 02/20/2022   Elevated troponin level not due myocardial infarction 02/20/2022   Dementia with behavioral disturbance ([place]) 02/20/2022   Multiple fractures of ribs, right side, subsequent encounter for fracture with routine healing 02/20/2022   COPD with acute exacerbation ([place]) 37/85/8850   Acute metabolic encephalopathy 27/74/1287   Ground-level fall 12/06/2021  Breast cancer ([place])    Chronic systolic CHF (congestive heart failure) (HCC)     Syncope 09/25/2021   Acute on chronic respiratory failure with hypoxia (HCC) 07/07/2020   Generalized weakness 07/07/2020   Decreased appetite 09/20/2019   Lower GI bleeding 09/09/2019   DNR (do not resuscitate) 09/08/2019   Chronic anemia 09/07/2019   Macrocytic anemia 09/07/2019   Jerking 09/07/2019   Fall at home, initial encounter 09/07/2019   History of CVA (cerebrovascular accident) 09/07/2019   History of right breast cancer 09/07/2019   Pelvic pain 03/25/2019   Dysphagia 02/18/2019   Coronary artery disease involving native coronary artery of native heart without angina pectoris 01/30/2019   Presence of drug coated stent in right coronary artery 01/30/2019   Left main coronary artery disease - ~50-60% dLM (&70% dLAD) -- Plan Med Rx 01/30/2019   Ischemic cardiomyopathy 01/30/2019   AAA (abdominal aortic aneurysm) ([place]) 12/31/2018   Malignant neoplasm of upper-outer quadrant of right breast in female, estrogen receptor positive ([place]) 09/26/2018   Breast mass 08/28/2018   Bilateral impacted cerumen 08/14/2017   Laryngopharyngeal reflux (LPR) 08/14/2017   Presbycusis of both ears 08/14/2017   Thoracic aortic aneurysm 04/10/2017   Abdominal pain 04/05/2017   Low back pain 01/01/2017   Medicare annual wellness visit, subsequent 09/26/2016   Hard of hearing 09/26/2016   Vitamin D deficiency 09/26/2016   Goals of care, counseling/discussion 09/26/2016   Encounter for chronic pain management 07/26/2016   Edema 10/21/2014   Multinodular thyroid 07/07/2013   COPD (chronic obstructive pulmonary disease) ([place]) 06/09/2013   HTN (hypertension) 06/09/2013   Chronic respiratory failure with hypoxia ([place]) 05/13/2013   Cough 05/02/2013   Vertigo 12/31/2012   Acute blood loss anemia 12/13/2011   Osteoarthritis of right knee 08/27/2011   Gold C Copd with asthmatic bronchitis component and chronic resp failure 06/26/2011   Neck pain 04/02/2011   RISK OF FALLING 08/17/2010   Hypokalemia  05/24/2009   Anemia, unspecified 04/10/2008   Anxiety 03/01/2008   Osteoporosis 06/23/2007   Chronic pain syndrome 12/26/2006   Acquired hypothyroidism 12/24/2006   Mixed hyperlipidemia 12/24/2006   COMMON MIGRAINE 12/24/2006   GERD without esophagitis 12/24/2006   DEGENERATIVE DISC DISEASE 12/24/2006   Fibromyalgia 12/24/2006   Insomnia 12/24/2006   HYPERGLYCEMIA 12/24/2006    Immunization History  Administered Date(s) Administered   Fluad Quad(high Dose 65+) 03/27/2019   Influenza Split 04/09/2011, 03/19/2012, 04/21/2013   Influenza Whole 04/08/2004, 05/07/2007, 03/11/2009, 04/21/2010   Influenza, High Dose Seasonal PF 04/24/2017   Influenza-Unspecified 04/20/2013, 04/07/2014, 05/05/2015, 04/08/2016   PFIZER(Purple Top)SARS-COV-2 Vaccination 07/28/2019, 08/18/2019   Pneumococcal Conjugate-13 08/23/2015   Pneumococcal Polysaccharide-23 07/09/1998   Td 04/19/2008   Zoster, Live 05/02/2015    Conditions to be addressed/monitored:  Hypertension, Hyperlipidemia, Heart Failure, Coronary Artery Disease, Hypothyroidism, Depression, and Osteoporosis  There are no care plans that you recently modified to display for this patient.     Medication Assistance: None required.  Patient affirms current coverage meets needs.  Compliance/Adherence/Medication fill history: Care Gaps: None  Star-Rating Drugs: Rosuvastatin - PDC 100%  Medication Access: Within the past 30 days, how often has patient missed a dose of medication? unknown Is a pillbox or other method used to improve adherence? Yes  Factors that may affect medication adherence? nonadherence to medications and memory impairment Are meds synced by current pharmacy? Yes  Are meds delivered by current pharmacy? Yes  Does patient experience delays in picking up medications due to transportation concerns? No  [facility] Reviewed: Is patient disadvantaged to use [facility]?: No  Current Rx insurance plan:  [employer] Name and location of Current pharmacy:  [place], [place] - [address] [place] [address] Phone: [phone] Fax: [place], [place] [address] Phone: [phone] Fax: [phone]  [facility] services reviewed with patient today?: Yes  Patient requests to transfer care to [facility]?: No  Reason patient declined to change pharmacies: Hesitance/Possibly interested in future   Care Plan and Follow Up Patient Decision:  Patient agrees to Care Plan and Follow-up.  Plan: Telephone follow up appointment with care management team member scheduled for:  2 months  Charlene Brooke, PharmD, BCACP Clinical Pharmacist [facility] [phone]    Current Barriers:  Struggle to self-administer medication - requires family support  Pharmacist Clinical Goal(s):  Patient will achieve ability to self administer medications as prescribed through use of pill packs as evidenced by patient report through collaboration with PharmD and provider.   Interventions: 1:1 collaboration with Tonia Ghent, MD regarding development and update of comprehensive plan of care as evidenced by provider attestation and co-signature Inter-disciplinary care team collaboration (see longitudinal plan of care) Comprehensive medication review performed; medication list updated in electronic medical record  Hypertension / Heart Failure (BP goal <130/80) -Controlled - per clinic readings -Last ejection fraction: 40-45% (Date: 01/2019) -HF type: Moderately reduced EF; NYHA Class: not on file -Current treatment: None -Medications previously tried: amlodipine, furosemide, losartan,   -Current home readings: n/a -Denies hypotensive/hypertensive symptoms -Educated on BP goals and benefits of medications for  prevention of heart attack, stroke and kidney damage; -Counseled to monitor BP at home periodically -Recommended to continue current medication  Hyperlipidemia / CAD: (LDL goal < 70) -Query controlled - LDL 60 (10/2021) at goal, however pt has not been taking clopidgrel, there is some confusion as it was discontinued during June admission but discharge summary notes to continue Plavix; it appears it may have run out of refills and pt failed to make overdue follow up appt with cardiology -Hx STEMI 01/2019 - DES PCI; hx AAA -Follows with cardiology (Dr Tamala Julian), overdue for f/u appt - last VV 02/03/20. No show for appt 01/03/22 (clearance for back surgery) -Current treatment: Rosuvastatin 10 mg daily - Appropriate, Effective, Safe, Accessible Clopidogrel 75 mg daily - not taking Nitroglycerin 0.4 mgSL prn - Appropriate, Effective, Safe, Accessible -Medications previously tried: n/a  -Educated on Cholesterol goals;  -Reviewed importance of clopidogrel, will consult with cardiology about restarting it -Recommended to continue current medication   COPD (Goal: control symptoms and prevent exacerbations) -Controlled -Gold Grade: Unknown; Current COPD Classification:  B (high sx, <2 exacerbations/yr) -MMRC/CAT score: not on file; Pulmonary function testing: not on file -Exacerbations requiring treatment in last 6 months: 1 -Current treatment  Albuterol HFA prn - Appropriate, Effective, Safe, Accessible Duoneb PRN - Appropriate, Effective, Safe, Accessible Dulera (mometasone-formoterol) 200-5 mcg/act 2 puff BID - Appropriate, Effective, Safe, Accessible Spiriva Handihaler 24mg 1 puff daily - Appropriate, Effective, Safe, Accessible Oxygen 3 L -Medications previously tried: n/a  -Patient reports consistent use of maintenance inhaler -Frequency of rescue inhaler use: PRN -Counseled on Proper inhaler technique; -Recommended to continue current medication  Depression/Anxiety (Goal: manage  symptoms) -Controlled -PHQ9: 0 (10/2021) -GAD7: 0 (07/2020) -Connected with PCP for mental health support -Current treatment:  Alprazolam 0.25 mg TID prn - Appropriate, Effective, Safe, Accessible Buspirone 5 mg BID - Appropriate, Effective, Safe, Accessible Duloxetine 60 mg daily - Appropriate, Effective, Safe, Accessible Trazodone 150 mg Wynetta Emery) - Appropriate, Effective, Safe, Accessible -Medications previously tried/failed: n/a -Educated on Benefits of medication for symptom control -Recommended to continue current medication  Dementia (Goal: slow progression) -Controlled -Current treatment  Donepezil 5 mg HS - Appropriate, Effective, Safe, Accessible -Medications previously tried: n/a  -Recommended to continue current medication  Osteoporosis (Goal prevent fractures) -Query controlled - alendronate was started after most recent DEXA, no repeat DEXA has been performed yet -Last DEXA Scan: 02/2019   T-Score total hip: -4.4  T-Score forearm radius: -3.5 -Patient is a candidate for pharmacologic treatment due to history of vertebral fracture and T-Score < -2.5 in total hip  -Current treatment  Alendronate 70 mg weekly (started 10/2018) - Appropriate, Query Effective Calcium carbonate 600 mg - Appropriate, Effective, Safe, Accessible Vitamin D 1000 IU -Appropriate, Effective, Safe, Accessible -Medications previously tried: n/a  -Counseled on oral bisphosphonate administration: take in the morning, 30 minutes prior to food with 6-8 oz of water. Do not lie down for at least 30 minutes after taking. -Recommended to continue current medication  Hypothyroidism (Goal TSH: 0.4-4.5) -Query controlled- TSH low at last check (0.067 12/06/2021) when checked during hospitalization -Current treatment: Levothyroxine 112 mcg daily - Appropriate, Query Effective -Counseled to take medication n/a -Recommended repeat TSH   Chronic pain (Goal: manage pain) -Controlled -Follows with NP Earmon Phoenix  ([facility]) -Current treatment  Hydrocodone-APAP 10-325 mg 1/2-1 tab q6h PRN - Appropriate, Effective, Safe, Accessible Methocarbamol 500 mg q6h PRN -Appropriate, Effective, Safe, Accessible Duloxetine 60 mg daily -Appropriate, Effective, Safe, Accessible Gabapentin 300 mg TID -Appropriate, Effective, Safe, Accessible Tylenol #3 Wynetta Emery) -Appropriate, Effective, Safe, Accessible -Medications previously tried: n/a  -Recommended to continue current medication  Hx Breast cancer (Goal: prevent recurrence) -Controlled -Dx 08/2018; last seen Oncology (Dr Jana Hakim) 09/2019 -Current treatment  Anastrozole 1 mg daily (started 03/2019) -Appropriate, Effective, Safe, Accessible -Medications previously tried: n/a  -Recommended to continue current medication  Medication management -Daughter [name] (lives local) manages medications for patient. 2nd daughter [name] is HCPOA but lives in [place] is currently using [facility] (based in [place], [employer]), it is often difficult to coordinate med changes and delivery and it is not preferred with her insurance. Reviewed local pharmacy [facility] can package meds and deliver within a day, and is preferred. Family will discuss with patient switching pharmacies for improved provider oversight over medications  Health Maintenance -Vaccine gaps: Shingrix, TDAP -Hx Lower GI bleeding 09/2019 -Current therapy:  Ferrous sulfate 325 mg Flonase PRN Multivitamin Miralax Senna +Pantoprazole 20 mg daily  Patient Goals/Self-Care Activities Patient will:  - take medications as prescribed as evidenced by patient report and record review focus on medication adherence by pill packs - consider switching to local [facility]

## 2022-04-03 ENCOUNTER — Inpatient Hospital Stay: Payer: Medicare PPO | Admitting: Family Medicine

## 2022-04-04 DIAGNOSIS — F331 Major depressive disorder, recurrent, moderate: Secondary | ICD-10-CM | POA: Diagnosis not present

## 2022-04-04 DIAGNOSIS — F411 Generalized anxiety disorder: Secondary | ICD-10-CM | POA: Diagnosis not present

## 2022-04-04 DIAGNOSIS — M818 Other osteoporosis without current pathological fracture: Secondary | ICD-10-CM | POA: Diagnosis not present

## 2022-04-04 DIAGNOSIS — C50919 Malignant neoplasm of unspecified site of unspecified female breast: Secondary | ICD-10-CM | POA: Diagnosis not present

## 2022-04-04 DIAGNOSIS — I110 Hypertensive heart disease with heart failure: Secondary | ICD-10-CM | POA: Diagnosis not present

## 2022-04-04 DIAGNOSIS — S2241XA Multiple fractures of ribs, right side, initial encounter for closed fracture: Secondary | ICD-10-CM | POA: Diagnosis not present

## 2022-04-04 DIAGNOSIS — R059 Cough, unspecified: Secondary | ICD-10-CM | POA: Diagnosis not present

## 2022-04-04 DIAGNOSIS — R296 Repeated falls: Secondary | ICD-10-CM | POA: Diagnosis not present

## 2022-04-04 DIAGNOSIS — K2100 Gastro-esophageal reflux disease with esophagitis, without bleeding: Secondary | ICD-10-CM | POA: Diagnosis not present

## 2022-04-04 DIAGNOSIS — I5022 Chronic systolic (congestive) heart failure: Secondary | ICD-10-CM | POA: Diagnosis not present

## 2022-04-04 DIAGNOSIS — G4700 Insomnia, unspecified: Secondary | ICD-10-CM | POA: Diagnosis not present

## 2022-04-04 DIAGNOSIS — Z8701 Personal history of pneumonia (recurrent): Secondary | ICD-10-CM | POA: Diagnosis not present

## 2022-04-04 DIAGNOSIS — F0390 Unspecified dementia without behavioral disturbance: Secondary | ICD-10-CM | POA: Diagnosis not present

## 2022-04-09 ENCOUNTER — Encounter: Payer: Self-pay | Admitting: *Deleted

## 2022-04-09 ENCOUNTER — Telehealth: Payer: Self-pay | Admitting: *Deleted

## 2022-04-10 NOTE — Patient Outreach (Signed)
  Care Coordination   04/10/2022 Name: [name] MRN: [id number] DOB: Nov 08, 1930   Care Coordination Outreach Attempts:  An unsuccessful telephone outreach was attempted today to offer the patient information about available care coordination services as a benefit of their health plan.   Follow Up Plan:  Additional outreach attempts will be made to offer the patient care coordination information and services.   Encounter Outcome:  No Answer  Care Coordination Interventions Activated:  No   Care Coordination Interventions:  No, not indicated    Eduard Clos MSW, LCSW Licensed Clinical Social Worker     [phone]

## 2022-04-11 DIAGNOSIS — F331 Major depressive disorder, recurrent, moderate: Secondary | ICD-10-CM | POA: Diagnosis not present

## 2022-04-11 DIAGNOSIS — F0390 Unspecified dementia without behavioral disturbance: Secondary | ICD-10-CM | POA: Diagnosis not present

## 2022-04-11 DIAGNOSIS — M25552 Pain in left hip: Secondary | ICD-10-CM | POA: Diagnosis not present

## 2022-04-11 DIAGNOSIS — M5136 Other intervertebral disc degeneration, lumbar region: Secondary | ICD-10-CM | POA: Diagnosis not present

## 2022-04-11 DIAGNOSIS — M545 Low back pain, unspecified: Secondary | ICD-10-CM | POA: Diagnosis not present

## 2022-04-16 ENCOUNTER — Ambulatory Visit: Payer: Self-pay | Admitting: *Deleted

## 2022-04-17 ENCOUNTER — Ambulatory Visit (HOSPITAL_COMMUNITY)
Admission: RE | Admit: 2022-04-17 | Discharge: 2022-04-17 | Disposition: A | Discharge status: Home / Self Care | Payer: Medicare PPO | Source: Ambulatory Visit | Attending: Interventional Radiology | Admitting: Interventional Radiology

## 2022-04-17 DIAGNOSIS — S22050A Wedge compression fracture of T5-T6 vertebra, initial encounter for closed fracture: Secondary | ICD-10-CM

## 2022-04-17 DIAGNOSIS — S22060A Wedge compression fracture of T7-T8 vertebra, initial encounter for closed fracture: Secondary | ICD-10-CM

## 2022-04-17 DIAGNOSIS — M8088XA Other osteoporosis with current pathological fracture, vertebra(e), initial encounter for fracture: Secondary | ICD-10-CM | POA: Diagnosis not present

## 2022-04-17 NOTE — Patient Outreach (Signed)
  Care Coordination   Follow Up Visit Note   04/17/2022 Name: Dayrin Stallone MRN: [id number] DOB: 1930/12/27  Shaylynn Nulty is a 88 y.o. year old female who sees Tonia Ghent, MD for primary care. I spoke with  famiy of [name] by phone today.  What matters to the patients health and wellness today?  Pt has been moved into ALF.    Goals Addressed               This Visit's Progress     COMPLETED: Find Help in My Community (pt-stated)        Timeframe:  Short-Term Goal Priority:  High Start Date:       01/12/22                     Expected End Date:                05/07/22 Follow Up Date 03/26/22   Pt's local daughter has advised CSW that pt is now residing in ALF at [facility] in [place]. CSW will sign off.   -review [employer] benefits- call to seek info as needed -review [employer] program for eligibility - begin a notebook of services in my neighborhood or community - call 211 when I need some help - follow-up on any referrals for help I am given - think ahead to make sure my need does not become an emergency - make a note about what I need to have by the phone or take with me, like an identification card or social security number have a back-up plan - have a back-up plan - make a list of family or friends that I can call    Why is this important?   Knowing how and where to find help for yourself or family in your neighborhood and community is an important skill.  You will want to take some steps to learn how.    Notes:         SDOH assessments and interventions completed:  Yes     Care Coordination Interventions Activated:  Yes  Care Coordination Interventions:  Yes, provided   Follow up plan: No further intervention required.   Encounter Outcome:  Pt. Visit Completed

## 2022-04-17 NOTE — Patient Instructions (Signed)
Visit Information  Thank you for taking time to visit with me today. Please don't hesitate to contact me if I can be of assistance to you.   Following are the goals we discussed today:   Goals Addressed               This Visit's Progress     COMPLETED: Find Help in My Community (pt-stated)        Timeframe:  Short-Term Goal Priority:  High Start Date:       01/12/22                     Expected End Date:                05/07/22 Follow Up Date 03/26/22   Pt's local daughter has advised CSW that pt is now residing in ALF at [facility] in [place]. CSW will sign off.   -       Hope things go well at [facility]! Please call the care guide team at [phone] if you need to schedule a follow up appointment.   If you are experiencing a Mental Health or Friendly or need someone to talk to, please call 911   Patient verbalizes understanding of instructions and care plan provided today and agrees to view in [place]. Active MyChart status and patient understanding of how to access instructions and care plan via MyChart confirmed with patient.     No further follow up required:   Eduard Clos MSW, LCSW Licensed Clinical Social Worker      [phone]

## 2022-04-18 DIAGNOSIS — F331 Major depressive disorder, recurrent, moderate: Secondary | ICD-10-CM | POA: Diagnosis not present

## 2022-04-18 DIAGNOSIS — F0390 Unspecified dementia without behavioral disturbance: Secondary | ICD-10-CM | POA: Diagnosis not present

## 2022-04-19 HISTORY — PX: IR RADIOLOGIST EVAL & MGMT: IMG5224

## 2022-04-26 ENCOUNTER — Telehealth (HOSPITAL_COMMUNITY): Payer: Self-pay

## 2022-04-26 ENCOUNTER — Other Ambulatory Visit (HOSPITAL_COMMUNITY): Payer: Self-pay | Admitting: Interventional Radiology

## 2022-04-26 NOTE — Telephone Encounter (Signed)
Called to schedule mri, no answer, left vm. AW 

## 2022-05-03 DIAGNOSIS — J690 Pneumonitis due to inhalation of food and vomit: Secondary | ICD-10-CM | POA: Diagnosis not present

## 2022-05-03 DIAGNOSIS — J441 Chronic obstructive pulmonary disease with (acute) exacerbation: Secondary | ICD-10-CM | POA: Diagnosis not present

## 2022-05-03 DIAGNOSIS — J440 Chronic obstructive pulmonary disease with acute lower respiratory infection: Secondary | ICD-10-CM | POA: Diagnosis not present

## 2022-05-09 DIAGNOSIS — F0390 Unspecified dementia without behavioral disturbance: Secondary | ICD-10-CM | POA: Diagnosis not present

## 2022-05-09 DIAGNOSIS — M79675 Pain in left toe(s): Secondary | ICD-10-CM | POA: Diagnosis not present

## 2022-05-09 DIAGNOSIS — B351 Tinea unguium: Secondary | ICD-10-CM | POA: Diagnosis not present

## 2022-05-09 DIAGNOSIS — M79674 Pain in right toe(s): Secondary | ICD-10-CM | POA: Diagnosis not present

## 2022-05-09 DIAGNOSIS — F331 Major depressive disorder, recurrent, moderate: Secondary | ICD-10-CM | POA: Diagnosis not present

## 2022-05-14 ENCOUNTER — Other Ambulatory Visit: Payer: Self-pay | Admitting: Family Medicine

## 2022-05-16 DIAGNOSIS — F0390 Unspecified dementia without behavioral disturbance: Secondary | ICD-10-CM | POA: Diagnosis not present

## 2022-05-16 DIAGNOSIS — F331 Major depressive disorder, recurrent, moderate: Secondary | ICD-10-CM | POA: Diagnosis not present

## 2022-05-30 DIAGNOSIS — F331 Major depressive disorder, recurrent, moderate: Secondary | ICD-10-CM | POA: Diagnosis not present

## 2022-05-30 DIAGNOSIS — F0390 Unspecified dementia without behavioral disturbance: Secondary | ICD-10-CM | POA: Diagnosis not present

## 2022-06-05 DIAGNOSIS — K2100 Gastro-esophageal reflux disease with esophagitis, without bleeding: Secondary | ICD-10-CM | POA: Diagnosis not present

## 2022-06-05 DIAGNOSIS — K219 Gastro-esophageal reflux disease without esophagitis: Secondary | ICD-10-CM | POA: Diagnosis not present

## 2022-06-05 DIAGNOSIS — K224 Dyskinesia of esophagus: Secondary | ICD-10-CM | POA: Diagnosis not present

## 2022-06-05 DIAGNOSIS — R1310 Dysphagia, unspecified: Secondary | ICD-10-CM | POA: Diagnosis not present

## 2022-06-06 DIAGNOSIS — F0390 Unspecified dementia without behavioral disturbance: Secondary | ICD-10-CM | POA: Diagnosis not present

## 2022-06-06 DIAGNOSIS — F331 Major depressive disorder, recurrent, moderate: Secondary | ICD-10-CM | POA: Diagnosis not present

## 2022-06-07 DIAGNOSIS — F413 Other mixed anxiety disorders: Secondary | ICD-10-CM | POA: Diagnosis not present

## 2022-06-07 DIAGNOSIS — E038 Other specified hypothyroidism: Secondary | ICD-10-CM | POA: Diagnosis not present

## 2022-06-09 ENCOUNTER — Other Ambulatory Visit: Payer: Self-pay

## 2022-06-09 ENCOUNTER — Emergency Department (HOSPITAL_BASED_OUTPATIENT_CLINIC_OR_DEPARTMENT_OTHER)
Admission: EM | Admit: 2022-06-09 | Discharge: 2022-06-10 | Disposition: A | Discharge status: Skilled Nursing Facility | Payer: Medicare PPO | Attending: Emergency Medicine | Admitting: Emergency Medicine

## 2022-06-09 ENCOUNTER — Encounter (HOSPITAL_BASED_OUTPATIENT_CLINIC_OR_DEPARTMENT_OTHER): Payer: Self-pay

## 2022-06-09 DIAGNOSIS — R1030 Lower abdominal pain, unspecified: Secondary | ICD-10-CM | POA: Diagnosis not present

## 2022-06-09 DIAGNOSIS — K6289 Other specified diseases of anus and rectum: Secondary | ICD-10-CM | POA: Insufficient documentation

## 2022-06-09 DIAGNOSIS — R110 Nausea: Secondary | ICD-10-CM | POA: Diagnosis not present

## 2022-06-09 DIAGNOSIS — K5900 Constipation, unspecified: Secondary | ICD-10-CM | POA: Insufficient documentation

## 2022-06-09 DIAGNOSIS — Z7982 Long term (current) use of aspirin: Secondary | ICD-10-CM | POA: Diagnosis not present

## 2022-06-09 DIAGNOSIS — R109 Unspecified abdominal pain: Secondary | ICD-10-CM | POA: Diagnosis not present

## 2022-06-09 NOTE — ED Provider Notes (Signed)
[facility] EMERGENCY DEPARTMENT  Provider Note  CSN: [id number] Arrival date & time: 06/09/22 2001  History Chief Complaint  Patient presents with   Constipation    [name] is a 88 y.o. female brought to the ED via EMS from ALF for evaluation of constipation and abdominal discomfort. She reports only small hard BM in the last 3 days with cramping lower abdominal and rectal pain. Not improved with laxatives given at her facility. Has had similar in the past.    Home Medications Prior to Admission medications   Medication Sig Start Date End Date Taking? Authorizing Provider  docusate sodium (COLACE) 100 MG capsule Take 1 capsule (100 mg total) by mouth every 12 (twelve) hours. 06/10/22  Yes Truddie Hidden, MD  polyethylene glycol powder (GLYCOLAX/MIRALAX) 17 GM/SCOOP powder Take 17 g by mouth 3 (three) times daily. 06/10/22  Yes Truddie Hidden, MD  acetaminophen (TYLENOL) 500 MG tablet Take 1,000 mg by mouth every 6 (six) hours as needed for mild pain or headache.    [provider]  albuterol (PROAIR HFA) 108 (90 Base) MCG/ACT inhaler Inhale 1-2 puffs into the lungs every 6 (six) hours as needed for wheezing or shortness of breath (Okay to fill with Ventolin or albuterol HFA). 10/30/19   Tonia Ghent, MD  alendronate (FOSAMAX) 70 MG tablet Take 1 tablet (70 mg total) by mouth every Monday. 10/02/21   Benay Pike, MD  ALPRAZolam Duanne Moron) 0.5 MG tablet Take 1 tablet (0.5 mg total) by mouth daily as needed for anxiety or sleep. 02/26/22   Darliss Cheney, MD  anastrozole (ARIMIDEX) 1 MG tablet Take 1 tablet (1 mg total) by mouth daily. 10/05/20   Magrinat, Virgie Dad, MD  aspirin EC 81 MG tablet Take 1 tablet (81 mg total) by mouth daily. Swallow whole. 02/27/22   Darliss Cheney, MD  busPIRone (BUSPAR) 5 MG tablet TAKE ONE TABLET BY MOUTH IN THE MORNING AND IN THE EVENING 05/14/22   Tonia Ghent, MD  Calcium Carbonate (CALTRATE 600 PO) Take 600 mg by  mouth at bedtime.    [provider]  carvedilol (COREG) 3.125 MG tablet Take 1 tablet (3.125 mg total) by mouth 2 (two) times daily with a meal. 02/26/22 03/28/22  Darliss Cheney, MD  cholecalciferol (VITAMIN D3) 25 MCG (1000 UNIT) tablet Take 1,000 Units by mouth at bedtime.    [provider]  donepezil (ARICEPT) 5 MG tablet TAKE ONE TABLET BY MOUTH AT BEDTIME 02/16/22   Tonia Ghent, MD  DULoxetine (CYMBALTA) 60 MG capsule TAKE ONE CAPSULE BY MOUTH DAILY 03/23/22   Tonia Ghent, MD  Ensure (ENSURE) Take 237 mLs by mouth 2 (two) times daily between meals.    [provider]  ferrous sulfate (FERROUSUL) 325 (65 FE) MG tablet Take 1 tablet (325 mg total) by mouth daily with breakfast. 08/22/18   Tonia Ghent, MD  fluticasone (FLONASE) 50 MCG/ACT nasal spray PLACE 2 SPRAYS INTO BOTH NOSTRILS DAILY. Patient taking differently: Place 2 sprays into both nostrils daily as needed for allergies. 01/28/20   Tonia Ghent, MD  ipratropium-albuterol (DUONEB) 0.5-2.5 (3) MG/3ML SOLN Take 3 mLs by nebulization 2 (two) times daily. 12/13/21   Florencia Reasons, MD  levothyroxine (SYNTHROID) 112 MCG tablet TAKE ONE TABLET BY MOUTH DAILY Patient taking differently: Take 112 mcg by mouth daily before breakfast. 11/08/21   Tonia Ghent, MD  lidocaine (LIDODERM) 5 % Place 1 patch onto the skin daily  as needed. Remove & Discard patch within 12 hours or as directed by MD 02/19/22   Wynona Dove A, DO  lisinopril (ZESTRIL) 20 MG tablet Take 1 tablet (20 mg total) by mouth daily. 02/27/22 03/29/22  Darliss Cheney, MD  Multiple Vitamin (MULTIVITAMIN WITH MINERALS) TABS tablet Take 1 tablet by mouth daily.    [provider]  Multiple Vitamins-Minerals (OCUVITE PRESERVISION PO) Take 1 tablet by mouth at bedtime.    [provider]  nitroGLYCERIN (NITROSTAT) 0.4 MG SL tablet Place 1 tablet (0.4 mg total) under the tongue every 5 (five) minutes as needed for chest pain. 02/23/20    Belva Crome, MD  OXYGEN Inhale 3-3.5 L into the lungs continuous.    [provider]  pantoprazole (PROTONIX) 20 MG tablet TAKE ONE TABLET BY MOUTH TWICE DAILY Patient taking differently: Take 20 mg by mouth 2 (two) times daily. 10/23/21   Tonia Ghent, MD  rosuvastatin (CRESTOR) 10 MG tablet Take 1 tablet (10 mg total) by mouth daily. 10/23/21   Tonia Ghent, MD  senna-docusate (SENOKOT-S) 8.6-50 MG tablet Take 1 tablet by mouth at bedtime. 12/13/21   Florencia Reasons, MD  SPIRIVA HANDIHALER 18 MCG inhalation capsule INHALE THE CONTENTS OF ONE CAPSULE VIA [place] Patient taking differently: Place 18 mcg into inhaler and inhale daily. 10/23/21   Tonia Ghent, MD  traZODone (DESYREL) 50 MG tablet Take 2 tablets (100 mg total) by mouth at bedtime. 01/30/22   Tonia Ghent, MD  bisoprolol (ZEBETA) 5 MG tablet Take 0.5 tablets (2.5 mg total) by mouth daily. 01/31/19 09/18/19  Belva Crome, MD     Allergies    Doxycycline, Sulfadiazine, and Sulfa antibiotics   Review of Systems   Review of Systems Please see HPI for pertinent positives and negatives  Physical Exam BP (!) 167/93   Pulse 70   Temp 98 F (36.7 C) (Oral)   Resp 20   Ht '5\' 1"'$  (1.549 m)   Wt 56.2 kg   SpO2 99%   BMI 23.41 kg/m   Physical Exam Vitals and nursing note reviewed.  Constitutional:      Appearance: Normal appearance.  HENT:     Head: Normocephalic and atraumatic.     Nose: Nose normal.     Mouth/Throat:     Mouth: Mucous membranes are moist.  Eyes:     Extraocular Movements: Extraocular movements intact.     Conjunctiva/sclera: Conjunctivae normal.  Cardiovascular:     Rate and Rhythm: Normal rate.  Pulmonary:     Effort: Pulmonary effort is normal.     Breath sounds: Normal breath sounds.  Abdominal:     General: Abdomen is flat.     Palpations: Abdomen is soft.     Tenderness: There is no abdominal tenderness.  Genitourinary:    Comments: Chaperone present. No stool in  rectal vault. No masses, no bleeding. No hemorrhoids.  Musculoskeletal:        General: No swelling. Normal range of motion.     Cervical back: Neck supple.  Skin:    General: Skin is warm and dry.  Neurological:     General: No focal deficit present.     Mental Status: She is alert.  Psychiatric:        Mood and Affect: Mood normal.     ED Results / Procedures / Treatments   EKG None  Procedures Procedures  Medications Ordered in the ED Medications  iohexol (OMNIPAQUE) 300 MG/ML  solution 100 mL (80 mLs Intravenous Contrast Given 06/10/22 0054)    Initial Impression and Plan  Patient here with constipation, does not have an impaction on exam. Will check labs and CT to rule out other process such as diverticulitis, SBO, UTI, etc.   ED Course   Clinical Course as of 06/10/22 0332  Sun Jun 10, 2022  0011 CBC with anemia, similar to previous.  [CS]  0023 CMP and lipase are unremarkable.  [CS]  0130 I personally viewed the images from radiology studies and agree with radiologist interpretation:  CT is neg for acute process, there is a stool burden.  [CS]  3235 UA is normal.  [CS]  0330 Plan discharge back to [facility], recommend increased bowel regiment and PCP follow up.  [CS]    Clinical Course User Index [CS] Truddie Hidden, MD     MDM Rules/Calculators/A&P Medical Decision Making Problems Addressed: Constipation, unspecified constipation type: acute illness or injury  Amount and/or Complexity of Data Reviewed Labs: ordered. Decision-making details documented in ED Course. Radiology: ordered and independent interpretation performed. Decision-making details documented in ED Course.  Risk OTC drugs. Prescription drug management.    Final Clinical Impression(s) / ED Diagnoses Final diagnoses:  Constipation, unspecified constipation type    Rx / DC Orders ED Discharge Orders          Ordered    polyethylene glycol powder (GLYCOLAX/MIRALAX) 17 GM/SCOOP  powder  3 times daily        06/10/22 0332    docusate sodium (COLACE) 100 MG capsule  Every 12 hours        06/10/22 0332             Truddie Hidden, MD 06/10/22 [phone]

## 2022-06-09 NOTE — ED Triage Notes (Addendum)
Per EMS pt has been constipated x3 days with no relief from laxatives that were rx by pcp. Pt coming from [facility]. Pt is HOH and reports EMS report is accurate. No other complaints. Wears 2L at baseline

## 2022-06-10 ENCOUNTER — Emergency Department (HOSPITAL_BASED_OUTPATIENT_CLINIC_OR_DEPARTMENT_OTHER): Payer: Medicare PPO

## 2022-06-10 ENCOUNTER — Encounter (HOSPITAL_BASED_OUTPATIENT_CLINIC_OR_DEPARTMENT_OTHER): Payer: Self-pay

## 2022-06-10 DIAGNOSIS — R404 Transient alteration of awareness: Secondary | ICD-10-CM | POA: Diagnosis not present

## 2022-06-10 DIAGNOSIS — R531 Weakness: Secondary | ICD-10-CM | POA: Diagnosis not present

## 2022-06-10 DIAGNOSIS — K5900 Constipation, unspecified: Secondary | ICD-10-CM | POA: Diagnosis not present

## 2022-06-10 DIAGNOSIS — R110 Nausea: Secondary | ICD-10-CM | POA: Diagnosis not present

## 2022-06-10 DIAGNOSIS — Z743 Need for continuous supervision: Secondary | ICD-10-CM | POA: Diagnosis not present

## 2022-06-10 DIAGNOSIS — R109 Unspecified abdominal pain: Secondary | ICD-10-CM | POA: Diagnosis not present

## 2022-06-10 LAB — CBC WITH DIFFERENTIAL/PLATELET
Abs Immature Granulocytes: 0.01 10*3/uL (ref 0.00–0.07)
Basophils Absolute: 0 10*3/uL (ref 0.0–0.1)
Basophils Relative: 1 %
Eosinophils Absolute: 0.1 10*3/uL (ref 0.0–0.5)
Eosinophils Relative: 2 %
HCT: 29.5 % — ABNORMAL LOW (ref 36.0–46.0)
Hemoglobin: 9.2 g/dL — ABNORMAL LOW (ref 12.0–15.0)
Immature Granulocytes: 0 %
Lymphocytes Relative: 29 %
Lymphs Abs: 1.2 10*3/uL (ref 0.7–4.0)
MCH: 32.9 pg (ref 26.0–34.0)
MCHC: 31.2 g/dL (ref 30.0–36.0)
MCV: 105.4 fL — ABNORMAL HIGH (ref 80.0–100.0)
Monocytes Absolute: 0.5 10*3/uL (ref 0.1–1.0)
Monocytes Relative: 11 %
Neutro Abs: 2.3 10*3/uL (ref 1.7–7.7)
Neutrophils Relative %: 57 %
Platelets: 158 10*3/uL (ref 150–400)
RBC: 2.8 MIL/uL — ABNORMAL LOW (ref 3.87–5.11)
RDW: 13.5 % (ref 11.5–15.5)
WBC: 4 10*3/uL (ref 4.0–10.5)
nRBC: 0 % (ref 0.0–0.2)

## 2022-06-10 LAB — COMPREHENSIVE METABOLIC PANEL
ALT: 12 U/L (ref 0–44)
AST: 35 U/L (ref 15–41)
Albumin: 3.5 g/dL (ref 3.5–5.0)
Alkaline Phosphatase: 36 U/L — ABNORMAL LOW (ref 38–126)
Anion gap: 5 (ref 5–15)
BUN: 15 mg/dL (ref 8–23)
CO2: 38 mmol/L — ABNORMAL HIGH (ref 22–32)
Calcium: 8.9 mg/dL (ref 8.9–10.3)
Chloride: 97 mmol/L — ABNORMAL LOW (ref 98–111)
Creatinine, Ser: 0.56 mg/dL (ref 0.44–1.00)
GFR, Estimated: >60 mL/min (ref >60)
Glucose, Bld: 98 mg/dL (ref 70–99)
Potassium: 3.8 mmol/L (ref 3.5–5.1)
Sodium: 140 mmol/L (ref 135–145)
Total Bilirubin: 0.4 mg/dL (ref 0.3–1.2)
Total Protein: 6.6 g/dL (ref 6.5–8.1)

## 2022-06-10 LAB — URINALYSIS, MICROSCOPIC (REFLEX)

## 2022-06-10 LAB — URINALYSIS, ROUTINE W REFLEX MICROSCOPIC
Bilirubin Urine: NEGATIVE
Glucose, UA: NEGATIVE mg/dL
Ketones, ur: NEGATIVE mg/dL
Leukocytes,Ua: NEGATIVE
Nitrite: NEGATIVE
Protein, ur: NEGATIVE mg/dL
Specific Gravity, Urine: 1.015 (ref 1.005–1.030)
pH: 8.5 — ABNORMAL HIGH (ref 5.0–8.0)

## 2022-06-10 LAB — LIPASE, BLOOD: Lipase: 28 U/L (ref 11–51)

## 2022-06-10 MED ORDER — DOCUSATE SODIUM 100 MG PO CAPS: 100.0000 mg | ORAL_CAPSULE | Freq: Two times a day (BID) | ORAL | 0 refills | Status: DC

## 2022-06-10 MED ORDER — POLYETHYLENE GLYCOL 3350 17 GM/SCOOP PO POWD: 17.0000 g | Freq: Three times a day (TID) | ORAL | 0 refills | Status: DC

## 2022-06-10 MED ORDER — IOHEXOL 300 MG/ML  SOLN
100.0000 mL | Freq: Once | INTRAMUSCULAR | Status: AC | PRN
  Administered 2022-06-10: 80 mL via INTRAVENOUS

## 2022-06-10 NOTE — ED Notes (Signed)
Pt transported to CT ?

## 2022-06-10 NOTE — Discharge Instructions (Signed)
Please increase Miralax to three times daily until having regular bowel movements then can decrease to once daily. May also try colace or Senakot for relief.

## 2022-06-13 DIAGNOSIS — H903 Sensorineural hearing loss, bilateral: Secondary | ICD-10-CM | POA: Diagnosis not present

## 2022-06-19 DIAGNOSIS — E038 Other specified hypothyroidism: Secondary | ICD-10-CM | POA: Diagnosis not present

## 2022-06-19 DIAGNOSIS — F413 Other mixed anxiety disorders: Secondary | ICD-10-CM | POA: Diagnosis not present

## 2022-06-26 DIAGNOSIS — L723 Sebaceous cyst: Secondary | ICD-10-CM | POA: Diagnosis not present

## 2022-06-26 DIAGNOSIS — H6502 Acute serous otitis media, left ear: Secondary | ICD-10-CM | POA: Diagnosis not present

## 2022-06-26 DIAGNOSIS — J310 Chronic rhinitis: Secondary | ICD-10-CM | POA: Diagnosis not present

## 2022-06-27 DIAGNOSIS — F0390 Unspecified dementia without behavioral disturbance: Secondary | ICD-10-CM | POA: Diagnosis not present

## 2022-06-27 DIAGNOSIS — F331 Major depressive disorder, recurrent, moderate: Secondary | ICD-10-CM | POA: Diagnosis not present

## 2022-07-11 DIAGNOSIS — F331 Major depressive disorder, recurrent, moderate: Secondary | ICD-10-CM | POA: Diagnosis not present

## 2022-07-11 DIAGNOSIS — F0390 Unspecified dementia without behavioral disturbance: Secondary | ICD-10-CM | POA: Diagnosis not present

## 2022-07-18 DIAGNOSIS — G4700 Insomnia, unspecified: Secondary | ICD-10-CM | POA: Diagnosis not present

## 2022-07-18 DIAGNOSIS — F411 Generalized anxiety disorder: Secondary | ICD-10-CM | POA: Diagnosis not present

## 2022-07-18 DIAGNOSIS — K5904 Chronic idiopathic constipation: Secondary | ICD-10-CM | POA: Diagnosis not present

## 2022-07-18 DIAGNOSIS — F331 Major depressive disorder, recurrent, moderate: Secondary | ICD-10-CM | POA: Diagnosis not present

## 2022-07-18 DIAGNOSIS — I69398 Other sequelae of cerebral infarction: Secondary | ICD-10-CM | POA: Diagnosis not present

## 2022-07-18 DIAGNOSIS — F01B3 Vascular dementia, moderate, with mood disturbance: Secondary | ICD-10-CM | POA: Diagnosis not present

## 2022-07-18 DIAGNOSIS — G894 Chronic pain syndrome: Secondary | ICD-10-CM | POA: Diagnosis not present

## 2022-07-20 ENCOUNTER — Encounter: Payer: Self-pay | Admitting: Family Medicine

## 2022-07-20 ENCOUNTER — Telehealth: Payer: Self-pay | Admitting: Family Medicine

## 2022-07-20 NOTE — Telephone Encounter (Signed)
Pt daughter [name] called requesting a call back stated pt is on 2  liter of oxygen is concern that she need to be on 3 or 31/2  of oxygen . Would like to discuss #[phone]

## 2022-07-20 NOTE — Telephone Encounter (Signed)
Error

## 2022-07-20 NOTE — Telephone Encounter (Signed)
Daughter would like a phone call regarding this asap

## 2022-07-20 NOTE — Telephone Encounter (Signed)
Unable to reach patient. Left voicemail to return call to our office.   

## 2022-07-21 DIAGNOSIS — Z20822 Contact with and (suspected) exposure to covid-19: Secondary | ICD-10-CM | POA: Diagnosis not present

## 2022-07-21 DIAGNOSIS — J441 Chronic obstructive pulmonary disease with (acute) exacerbation: Secondary | ICD-10-CM | POA: Diagnosis not present

## 2022-07-21 DIAGNOSIS — R059 Cough, unspecified: Secondary | ICD-10-CM | POA: Diagnosis not present

## 2022-07-21 DIAGNOSIS — R0602 Shortness of breath: Secondary | ICD-10-CM | POA: Diagnosis not present

## 2022-07-22 DIAGNOSIS — Z743 Need for continuous supervision: Secondary | ICD-10-CM | POA: Diagnosis not present

## 2022-07-22 DIAGNOSIS — R0602 Shortness of breath: Secondary | ICD-10-CM | POA: Diagnosis not present

## 2022-07-22 DIAGNOSIS — R69 Illness, unspecified: Secondary | ICD-10-CM | POA: Diagnosis not present

## 2022-07-23 NOTE — Telephone Encounter (Signed)
Left message to return call to our office.  

## 2022-07-23 NOTE — Telephone Encounter (Signed)
Spoke with patients daughter [name] and she stated she just needs to know what liter patients O2 is supposed to be on. There had been some confusion with her it needed to be set. I found in the chart that patient had been on 3L of O2. Nothing further needed.

## 2022-07-30 DIAGNOSIS — I110 Hypertensive heart disease with heart failure: Secondary | ICD-10-CM | POA: Diagnosis not present

## 2022-07-30 DIAGNOSIS — I5022 Chronic systolic (congestive) heart failure: Secondary | ICD-10-CM | POA: Diagnosis not present

## 2022-07-30 DIAGNOSIS — J449 Chronic obstructive pulmonary disease, unspecified: Secondary | ICD-10-CM | POA: Diagnosis not present

## 2022-08-01 DIAGNOSIS — C50912 Malignant neoplasm of unspecified site of left female breast: Secondary | ICD-10-CM | POA: Diagnosis not present

## 2022-08-01 DIAGNOSIS — D531 Other megaloblastic anemias, not elsewhere classified: Secondary | ICD-10-CM | POA: Diagnosis not present

## 2022-08-01 DIAGNOSIS — F411 Generalized anxiety disorder: Secondary | ICD-10-CM | POA: Diagnosis not present

## 2022-08-01 DIAGNOSIS — F0390 Unspecified dementia without behavioral disturbance: Secondary | ICD-10-CM | POA: Diagnosis not present

## 2022-08-01 DIAGNOSIS — F331 Major depressive disorder, recurrent, moderate: Secondary | ICD-10-CM | POA: Diagnosis not present

## 2022-08-08 ENCOUNTER — Other Ambulatory Visit: Payer: Self-pay | Admitting: Family Medicine

## 2022-08-15 DIAGNOSIS — K5904 Chronic idiopathic constipation: Secondary | ICD-10-CM | POA: Diagnosis not present

## 2022-08-15 DIAGNOSIS — F01B3 Vascular dementia, moderate, with mood disturbance: Secondary | ICD-10-CM | POA: Diagnosis not present

## 2022-08-15 DIAGNOSIS — G4700 Insomnia, unspecified: Secondary | ICD-10-CM | POA: Diagnosis not present

## 2022-08-15 DIAGNOSIS — F411 Generalized anxiety disorder: Secondary | ICD-10-CM | POA: Diagnosis not present

## 2022-08-15 DIAGNOSIS — G894 Chronic pain syndrome: Secondary | ICD-10-CM | POA: Diagnosis not present

## 2022-08-15 DIAGNOSIS — F331 Major depressive disorder, recurrent, moderate: Secondary | ICD-10-CM | POA: Diagnosis not present

## 2022-08-15 DIAGNOSIS — I69398 Other sequelae of cerebral infarction: Secondary | ICD-10-CM | POA: Diagnosis not present

## 2022-08-22 DIAGNOSIS — F0390 Unspecified dementia without behavioral disturbance: Secondary | ICD-10-CM | POA: Diagnosis not present

## 2022-08-22 DIAGNOSIS — F331 Major depressive disorder, recurrent, moderate: Secondary | ICD-10-CM | POA: Diagnosis not present

## 2022-08-28 DIAGNOSIS — C50912 Malignant neoplasm of unspecified site of left female breast: Secondary | ICD-10-CM | POA: Diagnosis not present

## 2022-08-28 DIAGNOSIS — M810 Age-related osteoporosis without current pathological fracture: Secondary | ICD-10-CM | POA: Diagnosis not present

## 2022-08-28 DIAGNOSIS — D531 Other megaloblastic anemias, not elsewhere classified: Secondary | ICD-10-CM | POA: Diagnosis not present

## 2022-08-28 DIAGNOSIS — F411 Generalized anxiety disorder: Secondary | ICD-10-CM | POA: Diagnosis not present

## 2022-08-28 DIAGNOSIS — G894 Chronic pain syndrome: Secondary | ICD-10-CM | POA: Diagnosis not present

## 2022-08-28 DIAGNOSIS — K591 Functional diarrhea: Secondary | ICD-10-CM | POA: Diagnosis not present

## 2022-08-29 DIAGNOSIS — F331 Major depressive disorder, recurrent, moderate: Secondary | ICD-10-CM | POA: Diagnosis not present

## 2022-08-29 DIAGNOSIS — I110 Hypertensive heart disease with heart failure: Secondary | ICD-10-CM | POA: Diagnosis not present

## 2022-08-29 DIAGNOSIS — F0390 Unspecified dementia without behavioral disturbance: Secondary | ICD-10-CM | POA: Diagnosis not present

## 2022-08-29 DIAGNOSIS — I5022 Chronic systolic (congestive) heart failure: Secondary | ICD-10-CM | POA: Diagnosis not present

## 2022-08-29 DIAGNOSIS — F411 Generalized anxiety disorder: Secondary | ICD-10-CM | POA: Diagnosis not present

## 2022-08-29 DIAGNOSIS — K5904 Chronic idiopathic constipation: Secondary | ICD-10-CM | POA: Diagnosis not present

## 2022-08-29 DIAGNOSIS — K2100 Gastro-esophageal reflux disease with esophagitis, without bleeding: Secondary | ICD-10-CM | POA: Diagnosis not present

## 2022-08-30 DIAGNOSIS — E038 Other specified hypothyroidism: Secondary | ICD-10-CM | POA: Diagnosis not present

## 2022-08-30 DIAGNOSIS — E559 Vitamin D deficiency, unspecified: Secondary | ICD-10-CM | POA: Diagnosis not present

## 2022-08-30 DIAGNOSIS — Z79899 Other long term (current) drug therapy: Secondary | ICD-10-CM | POA: Diagnosis not present

## 2022-09-05 DIAGNOSIS — C50912 Malignant neoplasm of unspecified site of left female breast: Secondary | ICD-10-CM | POA: Diagnosis not present

## 2022-09-05 DIAGNOSIS — D531 Other megaloblastic anemias, not elsewhere classified: Secondary | ICD-10-CM | POA: Diagnosis not present

## 2022-09-11 ENCOUNTER — Telehealth: Payer: Self-pay

## 2022-09-11 NOTE — Progress Notes (Signed)
Care Management & Coordination Services Pharmacy Team  Reason for Encounter: COPD  Contacted patient to discuss COPD disease state. Unsuccessful outreach. Left voicemail for patient to return call.  Current COPD regimen:  Albuterol Duoneb  Any recent hospitalizations or ED visits since last visit with CPP? Yes Reports***denies COPD symptoms, including {COPD Symptoms:24140} What recent interventions/DTPs have been made by any provider to improve breathing since last visit: Have you had exacerbation/flare-up since last visit? {yes/no:20286} What do you do when you are short of breath?  {SOBresposne:24139}***  Respiratory Devices/Equipment Do you have a nebulizer? Yes Do you use a Peak Flow Meter? {yes/no:20286} Do you use a maintenance inhaler? Yes How often do you forget to use your daily inhaler? *** Do you use a rescue inhaler? {yes/no:20286} How often do you use your rescue inhaler?  {CHL [facility] Inhaler TU:[id number] Do you use a spacer with your inhaler? {yes/no:20286}  Adherence Review: Does the patient have >5 day gap between last estimated fill date for maintenance inhaler medications? {yes/no:20286}   Chart Review:  Recent office visits:  ***  Recent consult visits:  [facility] visits:  {Hospital DC Yes/No:21091515} 07/21/22-William Zackowski,DO([facility])-cough,SOB, findings are consistent with mild COPD exacerbation. Patient was given DuoNeb treatment and Solu-Medrol with adequate symptom improvement. She is on baseline 3 L supplemental oxygen. While I initially considered hospital admission, based on reassuring ED work-up and response to treatment, I feel that patient is appropriate for discharge home with PCP follow-up.  06/09/22-Charles Sheldon,MD([facility])-constipation-start polyethylene glycol and docusate sodium. No admission   Medications: Outpatient Encounter Medications as of 09/11/2022  Medication Sig    acetaminophen (TYLENOL) 500 MG tablet Take 1,000 mg by mouth every 6 (six) hours as needed for mild pain or headache.   albuterol (PROAIR HFA) 108 (90 Base) MCG/ACT inhaler Inhale 1-2 puffs into the lungs every 6 (six) hours as needed for wheezing or shortness of breath (Okay to fill with Ventolin or albuterol HFA).   alendronate (FOSAMAX) 70 MG tablet Take 1 tablet (70 mg total) by mouth every Monday.   ALPRAZolam (XANAX) 0.5 MG tablet Take 1 tablet (0.5 mg total) by mouth daily as needed for anxiety or sleep.   anastrozole (ARIMIDEX) 1 MG tablet Take 1 tablet (1 mg total) by mouth daily.   aspirin EC 81 MG tablet Take 1 tablet (81 mg total) by mouth daily. Swallow whole.   busPIRone (BUSPAR) 5 MG tablet TAKE ONE TABLET BY MOUTH IN THE MORNING AND IN THE EVENING   Calcium Carbonate (CALTRATE 600 PO) Take 600 mg by mouth at bedtime.   carvedilol (COREG) 3.125 MG tablet Take 1 tablet (3.125 mg total) by mouth 2 (two) times daily with a meal.   cholecalciferol (VITAMIN D3) 25 MCG (1000 UNIT) tablet Take 1,000 Units by mouth at bedtime.   docusate sodium (COLACE) 100 MG capsule Take 1 capsule (100 mg total) by mouth every 12 (twelve) hours.   donepezil (ARICEPT) 5 MG tablet TAKE ONE TABLET BY MOUTH AT BEDTIME   DULoxetine (CYMBALTA) 60 MG capsule TAKE ONE CAPSULE BY MOUTH DAILY   Ensure (ENSURE) Take 237 mLs by mouth 2 (two) times daily between meals.   ferrous sulfate (FERROUSUL) 325 (65 FE) MG tablet Take 1 tablet (325 mg total) by mouth daily with breakfast.   fluticasone (FLONASE) 50 MCG/ACT nasal spray PLACE 2 SPRAYS INTO BOTH NOSTRILS DAILY. (Patient taking differently: Place 2 sprays into both nostrils daily as needed for allergies.)  ipratropium-albuterol (DUONEB) 0.5-2.5 (3) MG/3ML SOLN Take 3 mLs by nebulization 2 (two) times daily.   levothyroxine (SYNTHROID) 112 MCG tablet TAKE ONE TABLET BY MOUTH DAILY (Patient taking differently: Take 112 mcg by mouth daily before breakfast.)    lidocaine (LIDODERM) 5 % Place 1 patch onto the skin daily as needed. Remove & Discard patch within 12 hours or as directed by MD   lisinopril (ZESTRIL) 20 MG tablet Take 1 tablet (20 mg total) by mouth daily.   Multiple Vitamin (MULTIVITAMIN WITH MINERALS) TABS tablet Take 1 tablet by mouth daily.   Multiple Vitamins-Minerals (OCUVITE PRESERVISION PO) Take 1 tablet by mouth at bedtime.   nitroGLYCERIN (NITROSTAT) 0.4 MG SL tablet Place 1 tablet (0.4 mg total) under the tongue every 5 (five) minutes as needed for chest pain.   OXYGEN Inhale 3-3.5 L into the lungs continuous.   pantoprazole (PROTONIX) 20 MG tablet TAKE ONE TABLET BY MOUTH TWICE DAILY (Patient taking differently: Take 20 mg by mouth 2 (two) times daily.)   polyethylene glycol powder (GLYCOLAX/MIRALAX) 17 GM/SCOOP powder Take 17 g by mouth 3 (three) times daily.   rosuvastatin (CRESTOR) 10 MG tablet Take 1 tablet (10 mg total) by mouth daily.   senna-docusate (SENOKOT-S) 8.6-50 MG tablet Take 1 tablet by mouth at bedtime.   SPIRIVA HANDIHALER 18 MCG inhalation capsule INHALE THE CONTENTS OF ONE CAPSULE VIA HANDIHALER DAILY (Patient taking differently: Place 18 mcg into inhaler and inhale daily.)   traZODone (DESYREL) 50 MG tablet Take 2 tablets (100 mg total) by mouth at bedtime.   [DISCONTINUED] bisoprolol (ZEBETA) 5 MG tablet Take 0.5 tablets (2.5 mg total) by mouth daily.   No facility-administered encounter medications on file as of 09/11/2022.     Charlene Brooke, PharmD notified  Avel Sensor, [place] Assistant [phone]

## 2022-09-12 DIAGNOSIS — I69398 Other sequelae of cerebral infarction: Secondary | ICD-10-CM | POA: Diagnosis not present

## 2022-09-12 DIAGNOSIS — F0390 Unspecified dementia without behavioral disturbance: Secondary | ICD-10-CM | POA: Diagnosis not present

## 2022-09-12 DIAGNOSIS — G894 Chronic pain syndrome: Secondary | ICD-10-CM | POA: Diagnosis not present

## 2022-09-12 DIAGNOSIS — F411 Generalized anxiety disorder: Secondary | ICD-10-CM | POA: Diagnosis not present

## 2022-09-12 DIAGNOSIS — K5904 Chronic idiopathic constipation: Secondary | ICD-10-CM | POA: Diagnosis not present

## 2022-09-12 DIAGNOSIS — F01B3 Vascular dementia, moderate, with mood disturbance: Secondary | ICD-10-CM | POA: Diagnosis not present

## 2022-09-12 DIAGNOSIS — G4700 Insomnia, unspecified: Secondary | ICD-10-CM | POA: Diagnosis not present

## 2022-09-12 DIAGNOSIS — F331 Major depressive disorder, recurrent, moderate: Secondary | ICD-10-CM | POA: Diagnosis not present

## 2022-09-18 DIAGNOSIS — F0390 Unspecified dementia without behavioral disturbance: Secondary | ICD-10-CM | POA: Diagnosis not present

## 2022-09-18 DIAGNOSIS — F331 Major depressive disorder, recurrent, moderate: Secondary | ICD-10-CM | POA: Diagnosis not present

## 2022-09-19 ENCOUNTER — Other Ambulatory Visit: Payer: Self-pay | Admitting: Family Medicine

## 2022-09-19 NOTE — Telephone Encounter (Signed)
Patient needs a f/u with Dr. Damita Dunnings; preferably in person. She may need labs done when here.

## 2022-09-19 NOTE — Telephone Encounter (Signed)
Lvmtcb, sent mychart message  

## 2022-09-21 NOTE — Telephone Encounter (Signed)
Lvmtcb

## 2022-09-24 ENCOUNTER — Emergency Department (HOSPITAL_COMMUNITY): Payer: Medicare PPO

## 2022-09-24 ENCOUNTER — Emergency Department (HOSPITAL_COMMUNITY)
Admission: EM | Admit: 2022-09-24 | Discharge: 2022-09-25 | Disposition: A | Discharge status: Home / Self Care | Payer: Medicare PPO | Attending: Emergency Medicine | Admitting: Emergency Medicine

## 2022-09-24 ENCOUNTER — Emergency Department (HOSPITAL_BASED_OUTPATIENT_CLINIC_OR_DEPARTMENT_OTHER): Payer: Medicare PPO

## 2022-09-24 DIAGNOSIS — R109 Unspecified abdominal pain: Secondary | ICD-10-CM | POA: Insufficient documentation

## 2022-09-24 DIAGNOSIS — T1490XA Injury, unspecified, initial encounter: Secondary | ICD-10-CM | POA: Diagnosis not present

## 2022-09-24 DIAGNOSIS — E039 Hypothyroidism, unspecified: Secondary | ICD-10-CM | POA: Diagnosis not present

## 2022-09-24 DIAGNOSIS — W19XXXA Unspecified fall, initial encounter: Secondary | ICD-10-CM

## 2022-09-24 DIAGNOSIS — R0789 Other chest pain: Secondary | ICD-10-CM | POA: Insufficient documentation

## 2022-09-24 DIAGNOSIS — M549 Dorsalgia, unspecified: Secondary | ICD-10-CM | POA: Diagnosis not present

## 2022-09-24 DIAGNOSIS — Z7951 Long term (current) use of inhaled steroids: Secondary | ICD-10-CM | POA: Insufficient documentation

## 2022-09-24 DIAGNOSIS — F0390 Unspecified dementia without behavioral disturbance: Secondary | ICD-10-CM | POA: Insufficient documentation

## 2022-09-24 DIAGNOSIS — R410 Disorientation, unspecified: Secondary | ICD-10-CM | POA: Insufficient documentation

## 2022-09-24 DIAGNOSIS — J9811 Atelectasis: Secondary | ICD-10-CM | POA: Diagnosis not present

## 2022-09-24 DIAGNOSIS — J90 Pleural effusion, not elsewhere classified: Secondary | ICD-10-CM | POA: Diagnosis not present

## 2022-09-24 DIAGNOSIS — M79604 Pain in right leg: Secondary | ICD-10-CM | POA: Diagnosis not present

## 2022-09-24 DIAGNOSIS — Z853 Personal history of malignant neoplasm of breast: Secondary | ICD-10-CM | POA: Diagnosis not present

## 2022-09-24 DIAGNOSIS — R2243 Localized swelling, mass and lump, lower limb, bilateral: Secondary | ICD-10-CM | POA: Insufficient documentation

## 2022-09-24 DIAGNOSIS — Z7982 Long term (current) use of aspirin: Secondary | ICD-10-CM | POA: Diagnosis not present

## 2022-09-24 DIAGNOSIS — K760 Fatty (change of) liver, not elsewhere classified: Secondary | ICD-10-CM | POA: Diagnosis not present

## 2022-09-24 DIAGNOSIS — J449 Chronic obstructive pulmonary disease, unspecified: Secondary | ICD-10-CM | POA: Diagnosis not present

## 2022-09-24 DIAGNOSIS — Z79899 Other long term (current) drug therapy: Secondary | ICD-10-CM | POA: Insufficient documentation

## 2022-09-24 DIAGNOSIS — Z043 Encounter for examination and observation following other accident: Secondary | ICD-10-CM | POA: Diagnosis not present

## 2022-09-24 DIAGNOSIS — I10 Essential (primary) hypertension: Secondary | ICD-10-CM | POA: Diagnosis not present

## 2022-09-24 DIAGNOSIS — I6381 Other cerebral infarction due to occlusion or stenosis of small artery: Secondary | ICD-10-CM | POA: Diagnosis not present

## 2022-09-24 LAB — COMPREHENSIVE METABOLIC PANEL
ALT: 11 U/L (ref 0–44)
AST: 25 U/L (ref 15–41)
Albumin: 2.9 g/dL — ABNORMAL LOW (ref 3.5–5.0)
Alkaline Phosphatase: 33 U/L — ABNORMAL LOW (ref 38–126)
Anion gap: 7 (ref 5–15)
BUN: 7 mg/dL — ABNORMAL LOW (ref 8–23)
CO2: 40 mmol/L — ABNORMAL HIGH (ref 22–32)
Calcium: 8.6 mg/dL — ABNORMAL LOW (ref 8.9–10.3)
Chloride: 94 mmol/L — ABNORMAL LOW (ref 98–111)
Creatinine, Ser: 0.57 mg/dL (ref 0.44–1.00)
GFR, Estimated: >60 mL/min (ref >60)
Glucose, Bld: 100 mg/dL — ABNORMAL HIGH (ref 70–99)
Potassium: 3.5 mmol/L (ref 3.5–5.1)
Sodium: 141 mmol/L (ref 135–145)
Total Bilirubin: 0.7 mg/dL (ref 0.3–1.2)
Total Protein: 5.9 g/dL — ABNORMAL LOW (ref 6.5–8.1)

## 2022-09-24 LAB — I-STAT CHEM 8, ED
BUN: 9 mg/dL (ref 8–23)
Calcium, Ion: 1.16 mmol/L (ref 1.15–1.40)
Chloride: 92 mmol/L — ABNORMAL LOW (ref 98–111)
Creatinine, Ser: 0.6 mg/dL (ref 0.44–1.00)
Glucose, Bld: 96 mg/dL (ref 70–99)
HCT: 27 % — ABNORMAL LOW (ref 36.0–46.0)
Hemoglobin: 9.2 g/dL — ABNORMAL LOW (ref 12.0–15.0)
Potassium: 3.6 mmol/L (ref 3.5–5.1)
Sodium: 143 mmol/L (ref 135–145)
TCO2: 42 mmol/L — ABNORMAL HIGH (ref 22–32)

## 2022-09-24 LAB — CBC
HCT: 28.6 % — ABNORMAL LOW (ref 36.0–46.0)
Hemoglobin: 8.8 g/dL — ABNORMAL LOW (ref 12.0–15.0)
MCH: 33.7 pg (ref 26.0–34.0)
MCHC: 30.8 g/dL (ref 30.0–36.0)
MCV: 109.6 fL — ABNORMAL HIGH (ref 80.0–100.0)
Platelets: 132 10*3/uL — ABNORMAL LOW (ref 150–400)
RBC: 2.61 MIL/uL — ABNORMAL LOW (ref 3.87–5.11)
RDW: 14.1 % (ref 11.5–15.5)
WBC: 5.7 10*3/uL (ref 4.0–10.5)
nRBC: 0 % (ref 0.0–0.2)

## 2022-09-24 LAB — PROTIME-INR
INR: 1.1 (ref 0.8–1.2)
Prothrombin Time: 13.9 seconds (ref 11.4–15.2)

## 2022-09-24 LAB — CK: Total CK: 30 U/L — ABNORMAL LOW (ref 38–234)

## 2022-09-24 LAB — MAGNESIUM: Magnesium: 1.8 mg/dL (ref 1.7–2.4)

## 2022-09-24 MED ORDER — IOHEXOL 350 MG/ML SOLN
75.0000 mL | Freq: Once | INTRAVENOUS | Status: AC | PRN
  Administered 2022-09-24: 75 mL via INTRAVENOUS

## 2022-09-24 MED ORDER — ACETAMINOPHEN 325 MG PO TABS
650.0000 mg | ORAL_TABLET | Freq: Once | ORAL | Status: AC
  Administered 2022-09-24: 650 mg via ORAL
  Filled 2022-09-24: qty 2

## 2022-09-24 NOTE — Telephone Encounter (Signed)
Patient daughter called in and said that patient is currently in an assisted living and receiving her medication through there.

## 2022-09-24 NOTE — ED Notes (Signed)
Pt report received from previous nurse. Pt A&O x3, vitals stable, denies needs/complaints. Call bell in reach. No acute distress noted.  

## 2022-09-24 NOTE — ED Notes (Signed)
Pt fed meal, no acute distress. Call bell in reach.

## 2022-09-24 NOTE — Telephone Encounter (Signed)
Refill request denied.

## 2022-09-24 NOTE — ED Provider Notes (Signed)
[facility] Provider Note   CSN: [id number] Arrival date & time: 09/24/22  1603     History  Chief Complaint  Patient presents with   [name] [name] is a 89 y.o. female.  HPI Patient presents after fall.  Medical history includes HTN, HLD, hypothyroidism, migraine headaches, fibromyalgia, osteoporosis, COPD, AAA, breast CA, dementia, CVA, anemia, anxiety, CAD.  She arrives via EMS from a nursing facility.  History is provided by EMS.  Patient reportedly had a fall in the bathroom.  During this fall, she struck the back of her head.  She is not on a blood thinner.  It is unknown if the fall was witnessed.    Home Medications Prior to Admission medications   Medication Sig Start Date End Date Taking? Authorizing Provider  acetaminophen (TYLENOL) 500 MG tablet Take 1,000 mg by mouth every 6 (six) hours as needed for mild pain or headache.    [provider]  albuterol (PROAIR HFA) 108 (90 Base) MCG/ACT inhaler Inhale 1-2 puffs into the lungs every 6 (six) hours as needed for wheezing or shortness of breath (Okay to fill with Ventolin or albuterol HFA). 10/30/19   Tonia Ghent, MD  alendronate (FOSAMAX) 70 MG tablet Take 1 tablet (70 mg total) by mouth every Monday. 10/02/21   Benay Pike, MD  ALPRAZolam Duanne Moron) 0.5 MG tablet Take 1 tablet (0.5 mg total) by mouth daily as needed for anxiety or sleep. 02/26/22   Darliss Cheney, MD  anastrozole (ARIMIDEX) 1 MG tablet Take 1 tablet (1 mg total) by mouth daily. 10/05/20   Magrinat, Virgie Dad, MD  aspirin EC 81 MG tablet Take 1 tablet (81 mg total) by mouth daily. Swallow whole. 02/27/22   Darliss Cheney, MD  busPIRone (BUSPAR) 5 MG tablet TAKE ONE TABLET BY MOUTH IN THE MORNING AND IN THE EVENING 08/08/22   Tonia Ghent, MD  Calcium Carbonate (CALTRATE 600 PO) Take 600 mg by mouth at bedtime.    [provider]  carvedilol (COREG) 3.125 MG tablet Take 1 tablet  (3.125 mg total) by mouth 2 (two) times daily with a meal. 02/26/22 03/28/22  Darliss Cheney, MD  cholecalciferol (VITAMIN D3) 25 MCG (1000 UNIT) tablet Take 1,000 Units by mouth at bedtime.    [provider]  docusate sodium (COLACE) 100 MG capsule Take 1 capsule (100 mg total) by mouth every 12 (twelve) hours. 06/10/22   Truddie Hidden, MD  donepezil (ARICEPT) 5 MG tablet TAKE ONE TABLET BY MOUTH AT BEDTIME 02/16/22   Tonia Ghent, MD  DULoxetine (CYMBALTA) 60 MG capsule TAKE ONE CAPSULE BY MOUTH DAILY 03/23/22   Tonia Ghent, MD  Ensure (ENSURE) Take 237 mLs by mouth 2 (two) times daily between meals.    [provider]  ferrous sulfate (FERROUSUL) 325 (65 FE) MG tablet Take 1 tablet (325 mg total) by mouth daily with breakfast. 08/22/18   Tonia Ghent, MD  fluticasone (FLONASE) 50 MCG/ACT nasal spray PLACE 2 SPRAYS INTO BOTH NOSTRILS DAILY. Patient taking differently: Place 2 sprays into both nostrils daily as needed for allergies. 01/28/20   Tonia Ghent, MD  ipratropium-albuterol (DUONEB) 0.5-2.5 (3) MG/3ML SOLN Take 3 mLs by nebulization 2 (two) times daily. 12/13/21   Florencia Reasons, MD  levothyroxine (SYNTHROID) 112 MCG tablet TAKE ONE TABLET BY MOUTH DAILY Patient taking differently: Take 112 mcg by mouth daily before breakfast. 11/08/21   Elsie Stain  S, MD  lidocaine (LIDODERM) 5 % Place 1 patch onto the skin daily as needed. Remove & Discard patch within 12 hours or as directed by MD 02/19/22   Wynona Dove A, DO  lisinopril (ZESTRIL) 20 MG tablet Take 1 tablet (20 mg total) by mouth daily. 02/27/22 03/29/22  Darliss Cheney, MD  Multiple Vitamin (MULTIVITAMIN WITH MINERALS) TABS tablet Take 1 tablet by mouth daily.    [provider]  Multiple Vitamins-Minerals (OCUVITE PRESERVISION PO) Take 1 tablet by mouth at bedtime.    [provider]  nitroGLYCERIN (NITROSTAT) 0.4 MG SL tablet Place 1 tablet (0.4 mg total) under the tongue every 5 (five) minutes  as needed for chest pain. 02/23/20   Belva Crome, MD  OXYGEN Inhale 3-3.5 L into the lungs continuous.    [provider]  pantoprazole (PROTONIX) 20 MG tablet TAKE ONE TABLET BY MOUTH TWICE DAILY Patient taking differently: Take 20 mg by mouth 2 (two) times daily. 10/23/21   Tonia Ghent, MD  polyethylene glycol powder (GLYCOLAX/MIRALAX) 17 GM/SCOOP powder Take 17 g by mouth 3 (three) times daily. 06/10/22   Truddie Hidden, MD  rosuvastatin (CRESTOR) 10 MG tablet Take 1 tablet (10 mg total) by mouth daily. 10/23/21   Tonia Ghent, MD  senna-docusate (SENOKOT-S) 8.6-50 MG tablet Take 1 tablet by mouth at bedtime. 12/13/21   Florencia Reasons, MD  SPIRIVA HANDIHALER 18 MCG inhalation capsule INHALE THE CONTENTS OF ONE CAPSULE VIA [place] Patient taking differently: Place 18 mcg into inhaler and inhale daily. 10/23/21   Tonia Ghent, MD  traZODone (DESYREL) 50 MG tablet Take 2 tablets (100 mg total) by mouth at bedtime. 01/30/22   Tonia Ghent, MD  bisoprolol (ZEBETA) 5 MG tablet Take 0.5 tablets (2.5 mg total) by mouth daily. 01/31/19 09/18/19  Belva Crome, MD      Allergies    Doxycycline, Sulfadiazine, and Sulfa antibiotics    Review of Systems   Review of Systems  Unable to perform ROS: Dementia    Physical Exam Updated Vital Signs BP (!) 133/118 (BP Location: Right Arm)   Pulse 66   Temp 98.4 F (36.9 C) (Oral)   Resp 20   Ht 5\' 1"  (1.549 m)   Wt 55 kg   SpO2 99%   BMI 22.91 kg/m  Physical Exam Vitals and nursing note reviewed.  Constitutional:      General: She is not in acute distress.    Appearance: Normal appearance. She is well-developed. She is not ill-appearing, toxic-appearing or diaphoretic.  HENT:     Head: Normocephalic and atraumatic.     Right Ear: External ear normal.     Left Ear: External ear normal.     Nose: Nose normal.     Mouth/Throat:     Mouth: Mucous membranes are moist.  Eyes:     Extraocular Movements: Extraocular  movements intact.     Conjunctiva/sclera: Conjunctivae normal.  Neck:     Comments: Cervical collar in place Cardiovascular:     Rate and Rhythm: Normal rate and regular rhythm.     Heart sounds: No murmur heard. Pulmonary:     Effort: Pulmonary effort is normal. No respiratory distress.     Breath sounds: Normal breath sounds. No wheezing or rales.  Chest:     Chest wall: Tenderness present.  Abdominal:     General: There is no distension.     Palpations: Abdomen is soft.  Tenderness: There is abdominal tenderness. There is no guarding or rebound.  Musculoskeletal:        General: Tenderness (Right calf) present. No swelling or deformity.     Cervical back: Neck supple. No tenderness.     Right lower leg: Edema present.     Left lower leg: Edema present.  Skin:    General: Skin is warm and dry.     Coloration: Skin is not jaundiced or pale.  Neurological:     General: No focal deficit present.     Mental Status: She is alert. Mental status is at baseline. She is disoriented.     Cranial Nerves: No cranial nerve deficit.     Sensory: No sensory deficit.     Motor: No weakness.     Coordination: Coordination normal.  Psychiatric:        Mood and Affect: Mood normal.        Behavior: Behavior normal.     ED Results / Procedures / Treatments   Labs (all labs ordered are listed, but only abnormal results are displayed) Labs Reviewed  COMPREHENSIVE METABOLIC PANEL - Abnormal; Notable for the following components:      Result Value   Chloride 94 (*)    CO2 40 (*)    Glucose, Bld 100 (*)    BUN 7 (*)    Calcium 8.6 (*)    Total Protein 5.9 (*)    Albumin 2.9 (*)    Alkaline Phosphatase 33 (*)    All other components within normal limits  CBC - Abnormal; Notable for the following components:   RBC 2.61 (*)    Hemoglobin 8.8 (*)    HCT 28.6 (*)    MCV 109.6 (*)    Platelets 132 (*)    All other components within normal limits  CK - Abnormal; Notable for the  following components:   Total CK 30 (*)    All other components within normal limits  I-STAT CHEM 8, ED - Abnormal; Notable for the following components:   Chloride 92 (*)    TCO2 42 (*)    Hemoglobin 9.2 (*)    HCT 27.0 (*)    All other components within normal limits  PROTIME-INR  MAGNESIUM  URINALYSIS, ROUTINE W REFLEX MICROSCOPIC    EKG EKG Interpretation  Date/Time:  Monday September 24 2022 18:25:28 EDT Ventricular Rate:  77 PR Interval:  179 QRS Duration: 89 QT Interval:  542 QTC Calculation: 618 R Axis:   21 Text Interpretation: Sinus rhythm Probable anterior infarct, age indeterminate Prolonged QT interval Confirmed by Godfrey Pick [phone]) on 09/24/2022 6:35:21 PM  Radiology CT HEAD WO CONTRAST  Result Date: 09/24/2022 CLINICAL DATA:  Blunt polytrauma.  [id number]. EXAM: CT HEAD WITHOUT CONTRAST CT CERVICAL SPINE WITHOUT CONTRAST TECHNIQUE: Multidetector CT imaging of the head and cervical spine was performed following the standard protocol without intravenous contrast. Multiplanar CT image reconstructions of the cervical spine were also generated. RADIATION DOSE REDUCTION: This exam was performed according to the departmental dose-optimization program which includes automated exposure control, adjustment of the mA and/or kV according to patient size and/or use of iterative reconstruction technique. COMPARISON:  Cervical spine CT and head CT both 02/19/2022. FINDINGS: CT HEAD FINDINGS Brain: Again noted are moderate global atrophy, atrophic ventriculomegaly and moderate to severe small vessel disease of the cerebral white matter. There are stable bifrontal subdural hygromas, slightly greater on the left. There are chronic bilateral gangliocapsular lacunar infarctions, small chronic lacunar infarct left  cerebellar hemisphere. There is no new asymmetry concerning for an acute cortical based infarct, hemorrhage, mass effect, or midline shift. Vascular: The carotid siphons are moderately  calcified. There is no hyperdense central vessel. Skull: Negative for fractures or focal lesions. No visible scalp hematoma. Sinuses/Orbits: Old lens replacements with otherwise negative orbits. Mild fluid is again noted posteriorly in the sphenoid sinuses and there is mild membrane disease in the ethmoids. The maxillary and frontal sinuses are clear. Diffuse fluid opacification is again noted in the left mastoid air cells and partial fluid opacification of the left middle ear cavity. There is scattered fluid in the lower right mastoid air cells with the mid to upper right mastoid air cells and middle ear remaining clear. Both findings are similar to the previous exam. Other: There is left-sided deviation and spurring of the nasal septum. CT CERVICAL SPINE FINDINGS Alignment: There continues to be a slight cervical dextroscoliosis. No listhesis is seen and no facet malalignment. C1 is normally positioned on C2. Skull base and vertebrae: There is osteopenia without evidence of fractures. No pathologic bone lesion is seen. Soft tissues and spinal canal: No prevertebral fluid or swelling. No visible canal hematoma. There are patchy calcifications in both carotid bifurcations. Disc levels: Moderate disc space loss C4-5 and C5-6 is again seen. There is narrowing and mild spurring of the anterior atlantodental joint. At C2-3, central disc protrusion again indents the ventral thecal sac without mass effect on the cord. Minimal marginal endplate spurring [id number] and C5-6 is again noted but no other cervical levels demonstrate significant soft tissue or bony encroachment on the thecal sac. Left-greater-than-right facet and uncinate hypertrophy is similar to previous exam and causes severe left foraminal stenosis at C3-4 and C4-5, moderate to severe left foraminal stenosis C5-6, mild left foraminal stenosis C6-7. Other cervical foramina appear patent. Upper chest: Biapical pleural-parenchymal scarring is again shown. Metal spray  artifact from a right shoulder arthroplasty obscures the thyroid. Other: None. IMPRESSION: 1. No acute intracranial CT findings or depressed skull fractures. 2. Stable bifrontal subdural hygromas, atrophy and small-vessel disease. 3. Osteopenia and degenerative change without evidence of cervical fractures. 4. Sinus and left-greater-than-right mastoid disease, similar to the prior study, on the left with complete mastoid and partial middle ear fluid opacification. No external mastoid erosion is seen. Electronically Signed   By: Telford Nab M.D.   On: 09/24/2022 20:49   CT CERVICAL SPINE WO CONTRAST  Result Date: 09/24/2022 CLINICAL DATA:  Blunt polytrauma.  [id number]. EXAM: CT HEAD WITHOUT CONTRAST CT CERVICAL SPINE WITHOUT CONTRAST TECHNIQUE: Multidetector CT imaging of the head and cervical spine was performed following the standard protocol without intravenous contrast. Multiplanar CT image reconstructions of the cervical spine were also generated. RADIATION DOSE REDUCTION: This exam was performed according to the departmental dose-optimization program which includes automated exposure control, adjustment of the mA and/or kV according to patient size and/or use of iterative reconstruction technique. COMPARISON:  Cervical spine CT and head CT both 02/19/2022. FINDINGS: CT HEAD FINDINGS Brain: Again noted are moderate global atrophy, atrophic ventriculomegaly and moderate to severe small vessel disease of the cerebral white matter. There are stable bifrontal subdural hygromas, slightly greater on the left. There are chronic bilateral gangliocapsular lacunar infarctions, small chronic lacunar infarct left cerebellar hemisphere. There is no new asymmetry concerning for an acute cortical based infarct, hemorrhage, mass effect, or midline shift. Vascular: The carotid siphons are moderately calcified. There is no hyperdense central vessel. Skull: Negative for fractures or focal lesions. No  visible scalp hematoma.  Sinuses/Orbits: Old lens replacements with otherwise negative orbits. Mild fluid is again noted posteriorly in the sphenoid sinuses and there is mild membrane disease in the ethmoids. The maxillary and frontal sinuses are clear. Diffuse fluid opacification is again noted in the left mastoid air cells and partial fluid opacification of the left middle ear cavity. There is scattered fluid in the lower right mastoid air cells with the mid to upper right mastoid air cells and middle ear remaining clear. Both findings are similar to the previous exam. Other: There is left-sided deviation and spurring of the nasal septum. CT CERVICAL SPINE FINDINGS Alignment: There continues to be a slight cervical dextroscoliosis. No listhesis is seen and no facet malalignment. C1 is normally positioned on C2. Skull base and vertebrae: There is osteopenia without evidence of fractures. No pathologic bone lesion is seen. Soft tissues and spinal canal: No prevertebral fluid or swelling. No visible canal hematoma. There are patchy calcifications in both carotid bifurcations. Disc levels: Moderate disc space loss C4-5 and C5-6 is again seen. There is narrowing and mild spurring of the anterior atlantodental joint. At C2-3, central disc protrusion again indents the ventral thecal sac without mass effect on the cord. Minimal marginal endplate spurring [id number] and C5-6 is again noted but no other cervical levels demonstrate significant soft tissue or bony encroachment on the thecal sac. Left-greater-than-right facet and uncinate hypertrophy is similar to previous exam and causes severe left foraminal stenosis at C3-4 and C4-5, moderate to severe left foraminal stenosis C5-6, mild left foraminal stenosis C6-7. Other cervical foramina appear patent. Upper chest: Biapical pleural-parenchymal scarring is again shown. Metal spray artifact from a right shoulder arthroplasty obscures the thyroid. Other: None. IMPRESSION: 1. No acute intracranial CT  findings or depressed skull fractures. 2. Stable bifrontal subdural hygromas, atrophy and small-vessel disease. 3. Osteopenia and degenerative change without evidence of cervical fractures. 4. Sinus and left-greater-than-right mastoid disease, similar to the prior study, on the left with complete mastoid and partial middle ear fluid opacification. No external mastoid erosion is seen. Electronically Signed   By: Telford Nab M.D.   On: 09/24/2022 20:49   CT CHEST ABDOMEN PELVIS W CONTRAST  Result Date: 09/24/2022 CLINICAL DATA:  Status post trauma. EXAM: CT CHEST, ABDOMEN, AND PELVIS WITH CONTRAST TECHNIQUE: Multidetector CT imaging of the chest, abdomen and pelvis was performed following the standard protocol during bolus administration of intravenous contrast. RADIATION DOSE REDUCTION: This exam was performed according to the departmental dose-optimization program which includes automated exposure control, adjustment of the mA and/or kV according to patient size and/or use of iterative reconstruction technique. CONTRAST:  18mL OMNIPAQUE IOHEXOL 350 MG/ML SOLN COMPARISON:  June 10, 2022 FINDINGS: CT CHEST FINDINGS Cardiovascular: There is marked severity calcification of the thoracic aorta the ascending thoracic aorta measures 4.2 cm in diameter. Normal heart size with marked severity coronary artery calcification. Calcification of the mitral annulus is also seen. No pericardial effusion. Mediastinum/Nodes: No enlarged mediastinal, hilar, or axillary lymph nodes. Thyroid gland, trachea, and esophagus demonstrate no significant findings. Lungs/Pleura: Mild to moderate severity biapical atelectatic changes are seen, left greater than right. Moderate severity atelectasis is also noted within the posterior aspect of the bilateral lower lobes. Small bilateral pleural effusions are present, left slightly greater than right. No pneumothorax is identified. Musculoskeletal: A right shoulder replacement is noted. A  chronic compression fracture deformity is seen involving the T7 vertebral body. Multilevel degenerative changes are also seen throughout the thoracic spine CT ABDOMEN  PELVIS FINDINGS Hepatobiliary: There is diffuse fatty infiltration of the liver parenchyma. No focal liver abnormality is seen. No gallstones, gallbladder wall thickening, or biliary dilatation. Pancreas: Diffuse pancreatic atrophy is noted, without evidence of peripancreatic inflammation or pancreatic ductal dilatation. Spleen: Normal in size without focal abnormality. Adrenals/Urinary Tract: Adrenal glands are unremarkable. Kidneys are normal, without renal calculi, focal lesion, or hydronephrosis. Bladder is unremarkable. Stomach/Bowel: The stomach is poorly distended with moderate to marked severity diffuse gastric wall thickening. Moderate to marked severity thickening of the duodenal bulb and proximal duodenum is also noted. A short segment of intussuscepted proximal duodenum is again seen, just beyond the duodenal bulb (best seen on coronal reformatted images 30 through 40, CT series 7). There is no evidence of bowel dilatation. The appendix is not clearly identified. Vascular/Lymphatic: Aortic atherosclerosis with 4.3 cm x 4.8 cm aneurysmal dilatation of the infrarenal abdominal aorta. No enlarged abdominal or pelvic lymph nodes. Reproductive: A stable, markedly enlarged fibroid uterus is seen. Other: No abdominal wall hernia or abnormality. No abdominopelvic ascites. Musculoskeletal: Marked severity multilevel degenerative changes seen throughout the lumbar spine. IMPRESSION: 1. Small bilateral pleural effusions, left slightly greater than right. 2. Moderate severity bilateral lower lobe atelectasis. 3. Chronic compression fracture deformity of the T7 vertebral body. 4. Stable, markedly enlarged fibroid uterus. 5. Hepatic steatosis. 6. Stable aneurysmal dilatation of the infrarenal abdominal aorta. 7. Aortic atherosclerosis. Aortic  Atherosclerosis (ICD10-I70.0). Electronically Signed   By: Virgina Norfolk M.D.   On: 09/24/2022 20:27   CT L-SPINE NO CHARGE  Result Date: 09/24/2022 CLINICAL DATA:  Trauma EXAM: CT LUMBAR SPINE WITHOUT CONTRAST TECHNIQUE: Multidetector CT imaging of the lumbar spine was performed without intravenous contrast administration. Multiplanar CT image reconstructions were also generated. RADIATION DOSE REDUCTION: This exam was performed according to the departmental dose-optimization program which includes automated exposure control, adjustment of the mA and/or kV according to patient size and/or use of iterative reconstruction technique. COMPARISON:  CT chest, abdomen and pelvis today FINDINGS: Segmentation: 5 lumbar type vertebrae. Alignment: No subluxation Vertebrae: No acute fracture or focal pathologic process. Paraspinal and other soft tissues: Aneurysmal dilatation of the distal abdominal aorta. Abnormal soft tissue in the pelvis. See abdominal and pelvic CT report for further discussion. Disc levels: Diffuse advanced degenerative disc disease and facet disease. Fusion across the facet joints from L3-4 through L5-S1. IMPRESSION: Advanced degenerative disc and facet disease. No acute bony abnormality. Electronically Signed   By: Rolm Baptise M.D.   On: 09/24/2022 20:16   VAS [place] LOWER EXTREMITY VENOUS (DVT) (ONLY [facility] & [facility])  Result Date: 09/24/2022  Lower Venous DVT Study Patient Name:  [name] [facility]  Date of Exam:   09/24/2022 Medical Rec #: [id number]              Accession #:    [id number] Date of Birth: 1930-08-22               Patient Gender: F Patient Age:   68 years Exam Location:  [facility] Procedure:      VAS [place] LOWER EXTREMITY VENOUS (DVT) Referring Phys: Godfrey Pick --------------------------------------------------------------------------------  Indications: Right leg pain post fall (earlier today).  Comparison Study: No previous exams Performing Technologist: Jody Hill RVT, RDMS   Examination Guidelines: A complete evaluation includes B-mode imaging, spectral Doppler, color Doppler, and power Doppler as needed of all accessible portions of each vessel. Bilateral testing is considered an integral part of a complete examination. Limited examinations for reoccurring indications may be  performed as noted. The reflux portion of the exam is performed with the patient in reverse Trendelenburg.  +---------+---------------+---------+-----------+----------+--------------+ RIGHT    CompressibilityPhasicitySpontaneityPropertiesThrombus Aging +---------+---------------+---------+-----------+----------+--------------+ CFV      Full           Yes      Yes                                 +---------+---------------+---------+-----------+----------+--------------+ SFJ      Full                                                        +---------+---------------+---------+-----------+----------+--------------+ FV Prox  Full           Yes      Yes                                 +---------+---------------+---------+-----------+----------+--------------+ FV Mid   Full           Yes      Yes                                 +---------+---------------+---------+-----------+----------+--------------+ FV DistalFull           Yes      Yes                                 +---------+---------------+---------+-----------+----------+--------------+ PFV      Full                                                        +---------+---------------+---------+-----------+----------+--------------+ POP      Full           Yes      Yes                                 +---------+---------------+---------+-----------+----------+--------------+ PTV      Full                                                        +---------+---------------+---------+-----------+----------+--------------+ PERO     Full                                                         +---------+---------------+---------+-----------+----------+--------------+   +----+---------------+---------+-----------+----------+--------------+ LEFTCompressibilityPhasicitySpontaneityPropertiesThrombus Aging +----+---------------+---------+-----------+----------+--------------+ CFV Full           Yes      Yes                                 +----+---------------+---------+-----------+----------+--------------+  Summary: RIGHT: - There is no evidence of deep vein thrombosis in the lower extremity.  - No cystic structure found in the popliteal fossa.  LEFT: - No evidence of common femoral vein obstruction.  *See table(s) above for measurements and observations.    Preliminary    DG Chest Port 1 View  Result Date: 09/24/2022 CLINICAL DATA:  Trauma, fall EXAM: PORTABLE CHEST 1 VIEW COMPARISON:  07/21/2022 FINDINGS: Transverse diameter of heart is increased. Thoracic aorta is tortuous and ectatic. There are no signs of alveolar pulmonary edema. Linear densities are seen in left lower lung field. There is no pneumothorax. There is previous right shoulder arthroplasty. IMPRESSION: There are linear densities in the medial left lower lung fields suggesting atelectasis/pneumonia. Electronically Signed   By: Elmer Picker M.D.   On: 09/24/2022 16:52   DG Hand Complete Right  Result Date: 09/24/2022 CLINICAL DATA:  Trauma, fall EXAM: RIGHT HAND - COMPLETE 3+ VIEW COMPARISON:  None Available. FINDINGS: Monitoring device is partly obscuring the phalanges in middle finger. As far as seen, there are no displaced fractures or dislocation. Osteopenia is seen in bony structures. Degenerative changes with bony spurs are noted in multiple interphalangeal joints. IMPRESSION: No displaced fracture or dislocation is seen in right hand. Electronically Signed   By: Elmer Picker M.D.   On: 09/24/2022 16:51   DG Pelvis Portable  Result Date: 09/24/2022 CLINICAL DATA:  Trauma, fall EXAM: PORTABLE  PELVIS 1-2 VIEWS COMPARISON:  02/19/2022 FINDINGS: Osteopenia is seen in bony structures. Due to oblique positioning, proximal right femur is not optimally evaluated. As far as seen, there is questionable deformity in the medial margin of subcapital portion of neck of right femur. Degenerative changes are noted in the visualized lower lumbar spine. Arterial calcifications are seen. There is possible ectasia of infrarenal aorta. This finding is not fully evaluated. There is homogeneous opacity in pelvis suggesting possible enlarged uterus. IMPRESSION: No grossly displaced fracture or dislocation is seen in the limited AP supine portable view of pelvis. Medial aspect of subcapital portion of neck is not adequately visualized for evaluation. If clinically warranted, repeat radiographic examination including right hip radiographs or CT should be considered to rule out possible impacted fracture in the neck of right femur. Electronically Signed   By: Elmer Picker M.D.   On: 09/24/2022 16:49    Procedures Procedures    Medications Ordered in ED Medications  acetaminophen (TYLENOL) tablet 650 mg (650 mg Oral Given 09/24/22 1657)  iohexol (OMNIPAQUE) 350 MG/ML injection 75 mL (75 mLs Intravenous Contrast Given 09/24/22 1950)    ED Course/ Medical Decision Making/ A&P                             Medical Decision Making Amount and/or Complexity of Data Reviewed Labs: ordered. Radiology: ordered. ECG/medicine tests: ordered.  Risk OTC drugs. Prescription drug management.   This patient presents to the ED for concern of fall, this involves an extensive number of treatment options, and is a complaint that carries with it a high risk of complications and morbidity.  The differential diagnosis includes acute injuries   Co morbidities that complicate the patient evaluation  HTN, HLD, hypothyroidism, migraine headaches, fibromyalgia, osteoporosis, COPD, AAA, breast CA, dementia, CVA, anemia,  anxiety, CAD   Additional history obtained:  Additional history obtained from EMS, patient's daughter External records from outside source obtained and reviewed including EMR   Lab Tests:  I Ordered, and  personally interpreted labs.  The pertinent results include: Baseline anemia, no leukocytosis, normal kidney function, baseline hypochloremia with otherwise normal electrolytes, elevated bicarb consistent with COPD   Imaging Studies ordered:  I ordered imaging studies including x-ray of chest, pelvis, right hand; RLE DVT study; CT imaging of head, cervical spine, chest, abdomen, pelvis, T-spine, L-spine I independently visualized and interpreted imaging which showed no acute findings I agree with the radiologist interpretation   Cardiac Monitoring: / EKG:  The patient was maintained on a cardiac monitor.  I personally viewed and interpreted the cardiac monitored which showed an underlying rhythm of: Sinus rhythm  Problem List / ED Course / Critical interventions / Medication management  Patient presents after a fall.  Due to her dementia, she is not able to provide history.  She is not able to provide details of the fall.  History is provided initially by EMS only.  She reportedly had a fall while in the bathroom at her nursing facility.  Is unclear if this was witnessed.  She reportedly struck the back of her head.  On exam, patient is awake and alert.  She is disoriented.  No swelling or lacerations are appreciated on the back of her head.  Cervical collar is in place.  She does have diffuse chest wall and abdominal tenderness.  She also has tenderness in her right calf.  She has bilateral lower extremity edema which does appear to be worse on the right.  Tylenol was ordered for analgesia.  Patient was placed on bedside cardiac monitor.  Trauma workup was ordered.  Will also check for RLE DVT.  DVT study was negative.  Lab work shows baseline anemia baseline hypochloremia.  Bicarb is  elevated consistent with COPD.  CT imaging did not show any acute injuries.  Patient remained comfortable with normal vital signs while in the ED.  I did speak with her daughter over telephone to update her on workup results.  Patient's daughter did not have any further concerns.  Patient's daughter is comfortable with patient going back to facility.  Patient was discharged in stable condition. I ordered medication including Tylenol for analgesia Reevaluation of the patient after these medicines showed that the patient improved I have reviewed the patients home medicines and have made adjustments as needed   Social Determinants of Health:  Has dementia and resides in skilled nursing facility        Final Clinical Impression(s) / ED Diagnoses Final diagnoses:  Fall, initial encounter    Rx / DC Orders ED Discharge Orders     None         Godfrey Pick, MD 09/24/22 2227

## 2022-09-24 NOTE — Discharge Instructions (Addendum)
Your imaging studies in the emergency department do not show any new injuries.  Continue home medications as prescribed.  Follow-up with your primary care doctor.  Return to the emergency department for any new or worsening symptoms of concern.

## 2022-09-24 NOTE — ED Notes (Signed)
Pt cleaned up and changed, comfortable, no acute distress noted. Call bell in reach

## 2022-09-24 NOTE — ED Notes (Signed)
Ptar called 

## 2022-09-24 NOTE — Progress Notes (Signed)
RLE venous duplex has been completed.  Preliminary results given to Dr. Doren Custard.   Results can be found under chart review under CV PROC. 09/24/2022 5:10 PM Truly Stankiewicz RVT, RDMS

## 2022-09-24 NOTE — ED Triage Notes (Signed)
BIBA from ALF, fell in bathroom, hematoma to back to head. [facility] via EMS.  H/o falls and dementia.  O2 dependent 3 L

## 2022-09-24 NOTE — Telephone Encounter (Signed)
Daughter called in and said patient is in assisted living and getting medications from them. Request denied.

## 2022-09-25 DIAGNOSIS — R531 Weakness: Secondary | ICD-10-CM | POA: Diagnosis not present

## 2022-09-25 DIAGNOSIS — Z7401 Bed confinement status: Secondary | ICD-10-CM | POA: Diagnosis not present

## 2022-09-26 DIAGNOSIS — I110 Hypertensive heart disease with heart failure: Secondary | ICD-10-CM | POA: Diagnosis not present

## 2022-09-26 DIAGNOSIS — F411 Generalized anxiety disorder: Secondary | ICD-10-CM | POA: Diagnosis not present

## 2022-09-26 DIAGNOSIS — K5904 Chronic idiopathic constipation: Secondary | ICD-10-CM | POA: Diagnosis not present

## 2022-09-26 DIAGNOSIS — I5022 Chronic systolic (congestive) heart failure: Secondary | ICD-10-CM | POA: Diagnosis not present

## 2022-09-26 DIAGNOSIS — F0390 Unspecified dementia without behavioral disturbance: Secondary | ICD-10-CM | POA: Diagnosis not present

## 2022-09-26 DIAGNOSIS — K2100 Gastro-esophageal reflux disease with esophagitis, without bleeding: Secondary | ICD-10-CM | POA: Diagnosis not present

## 2022-10-03 DIAGNOSIS — I2510 Atherosclerotic heart disease of native coronary artery without angina pectoris: Secondary | ICD-10-CM | POA: Diagnosis not present

## 2022-10-03 DIAGNOSIS — I130 Hypertensive heart and chronic kidney disease with heart failure and stage 1 through stage 4 chronic kidney disease, or unspecified chronic kidney disease: Secondary | ICD-10-CM | POA: Diagnosis not present

## 2022-10-03 DIAGNOSIS — D631 Anemia in chronic kidney disease: Secondary | ICD-10-CM | POA: Diagnosis not present

## 2022-10-03 DIAGNOSIS — N189 Chronic kidney disease, unspecified: Secondary | ICD-10-CM | POA: Diagnosis not present

## 2022-10-03 DIAGNOSIS — J441 Chronic obstructive pulmonary disease with (acute) exacerbation: Secondary | ICD-10-CM | POA: Diagnosis not present

## 2022-10-03 DIAGNOSIS — I5022 Chronic systolic (congestive) heart failure: Secondary | ICD-10-CM | POA: Diagnosis not present

## 2022-10-03 DIAGNOSIS — J9611 Chronic respiratory failure with hypoxia: Secondary | ICD-10-CM | POA: Diagnosis not present

## 2022-10-03 DIAGNOSIS — J690 Pneumonitis due to inhalation of food and vomit: Secondary | ICD-10-CM | POA: Diagnosis not present

## 2022-10-03 DIAGNOSIS — J9612 Chronic respiratory failure with hypercapnia: Secondary | ICD-10-CM | POA: Diagnosis not present

## 2022-10-05 DIAGNOSIS — D531 Other megaloblastic anemias, not elsewhere classified: Secondary | ICD-10-CM | POA: Diagnosis not present

## 2022-10-05 DIAGNOSIS — J9611 Chronic respiratory failure with hypoxia: Secondary | ICD-10-CM | POA: Diagnosis not present

## 2022-10-10 DIAGNOSIS — B351 Tinea unguium: Secondary | ICD-10-CM | POA: Diagnosis not present

## 2022-10-10 DIAGNOSIS — M79674 Pain in right toe(s): Secondary | ICD-10-CM | POA: Diagnosis not present

## 2022-10-10 DIAGNOSIS — M79675 Pain in left toe(s): Secondary | ICD-10-CM | POA: Diagnosis not present

## 2022-10-12 DIAGNOSIS — N189 Chronic kidney disease, unspecified: Secondary | ICD-10-CM | POA: Diagnosis not present

## 2022-10-12 DIAGNOSIS — D631 Anemia in chronic kidney disease: Secondary | ICD-10-CM | POA: Diagnosis not present

## 2022-10-12 DIAGNOSIS — J441 Chronic obstructive pulmonary disease with (acute) exacerbation: Secondary | ICD-10-CM | POA: Diagnosis not present

## 2022-10-12 DIAGNOSIS — I2510 Atherosclerotic heart disease of native coronary artery without angina pectoris: Secondary | ICD-10-CM | POA: Diagnosis not present

## 2022-10-12 DIAGNOSIS — J9612 Chronic respiratory failure with hypercapnia: Secondary | ICD-10-CM | POA: Diagnosis not present

## 2022-10-12 DIAGNOSIS — I130 Hypertensive heart and chronic kidney disease with heart failure and stage 1 through stage 4 chronic kidney disease, or unspecified chronic kidney disease: Secondary | ICD-10-CM | POA: Diagnosis not present

## 2022-10-12 DIAGNOSIS — I5022 Chronic systolic (congestive) heart failure: Secondary | ICD-10-CM | POA: Diagnosis not present

## 2022-10-12 DIAGNOSIS — J9611 Chronic respiratory failure with hypoxia: Secondary | ICD-10-CM | POA: Diagnosis not present

## 2022-10-12 DIAGNOSIS — J690 Pneumonitis due to inhalation of food and vomit: Secondary | ICD-10-CM | POA: Diagnosis not present

## 2022-10-16 DIAGNOSIS — I130 Hypertensive heart and chronic kidney disease with heart failure and stage 1 through stage 4 chronic kidney disease, or unspecified chronic kidney disease: Secondary | ICD-10-CM | POA: Diagnosis not present

## 2022-10-16 DIAGNOSIS — J690 Pneumonitis due to inhalation of food and vomit: Secondary | ICD-10-CM | POA: Diagnosis not present

## 2022-10-16 DIAGNOSIS — F0390 Unspecified dementia without behavioral disturbance: Secondary | ICD-10-CM | POA: Diagnosis not present

## 2022-10-16 DIAGNOSIS — F331 Major depressive disorder, recurrent, moderate: Secondary | ICD-10-CM | POA: Diagnosis not present

## 2022-10-16 DIAGNOSIS — J441 Chronic obstructive pulmonary disease with (acute) exacerbation: Secondary | ICD-10-CM | POA: Diagnosis not present

## 2022-10-16 DIAGNOSIS — N189 Chronic kidney disease, unspecified: Secondary | ICD-10-CM | POA: Diagnosis not present

## 2022-10-16 DIAGNOSIS — I5022 Chronic systolic (congestive) heart failure: Secondary | ICD-10-CM | POA: Diagnosis not present

## 2022-10-16 DIAGNOSIS — G894 Chronic pain syndrome: Secondary | ICD-10-CM | POA: Diagnosis not present

## 2022-10-16 DIAGNOSIS — K5904 Chronic idiopathic constipation: Secondary | ICD-10-CM | POA: Diagnosis not present

## 2022-10-16 DIAGNOSIS — J9612 Chronic respiratory failure with hypercapnia: Secondary | ICD-10-CM | POA: Diagnosis not present

## 2022-10-16 DIAGNOSIS — D631 Anemia in chronic kidney disease: Secondary | ICD-10-CM | POA: Diagnosis not present

## 2022-10-16 DIAGNOSIS — I2510 Atherosclerotic heart disease of native coronary artery without angina pectoris: Secondary | ICD-10-CM | POA: Diagnosis not present

## 2022-10-16 DIAGNOSIS — J9611 Chronic respiratory failure with hypoxia: Secondary | ICD-10-CM | POA: Diagnosis not present

## 2022-10-17 DIAGNOSIS — F331 Major depressive disorder, recurrent, moderate: Secondary | ICD-10-CM | POA: Diagnosis not present

## 2022-10-17 DIAGNOSIS — I69398 Other sequelae of cerebral infarction: Secondary | ICD-10-CM | POA: Diagnosis not present

## 2022-10-17 DIAGNOSIS — G4700 Insomnia, unspecified: Secondary | ICD-10-CM | POA: Diagnosis not present

## 2022-10-17 DIAGNOSIS — F01B3 Vascular dementia, moderate, with mood disturbance: Secondary | ICD-10-CM | POA: Diagnosis not present

## 2022-10-17 DIAGNOSIS — F411 Generalized anxiety disorder: Secondary | ICD-10-CM | POA: Diagnosis not present

## 2022-10-24 DIAGNOSIS — D631 Anemia in chronic kidney disease: Secondary | ICD-10-CM | POA: Diagnosis not present

## 2022-10-24 DIAGNOSIS — J9611 Chronic respiratory failure with hypoxia: Secondary | ICD-10-CM | POA: Diagnosis not present

## 2022-10-24 DIAGNOSIS — J441 Chronic obstructive pulmonary disease with (acute) exacerbation: Secondary | ICD-10-CM | POA: Diagnosis not present

## 2022-10-24 DIAGNOSIS — J690 Pneumonitis due to inhalation of food and vomit: Secondary | ICD-10-CM | POA: Diagnosis not present

## 2022-10-24 DIAGNOSIS — N189 Chronic kidney disease, unspecified: Secondary | ICD-10-CM | POA: Diagnosis not present

## 2022-10-24 DIAGNOSIS — I2510 Atherosclerotic heart disease of native coronary artery without angina pectoris: Secondary | ICD-10-CM | POA: Diagnosis not present

## 2022-10-24 DIAGNOSIS — J9612 Chronic respiratory failure with hypercapnia: Secondary | ICD-10-CM | POA: Diagnosis not present

## 2022-10-24 DIAGNOSIS — I130 Hypertensive heart and chronic kidney disease with heart failure and stage 1 through stage 4 chronic kidney disease, or unspecified chronic kidney disease: Secondary | ICD-10-CM | POA: Diagnosis not present

## 2022-10-24 DIAGNOSIS — I5022 Chronic systolic (congestive) heart failure: Secondary | ICD-10-CM | POA: Diagnosis not present

## 2022-10-31 DIAGNOSIS — F411 Generalized anxiety disorder: Secondary | ICD-10-CM | POA: Diagnosis not present

## 2022-11-02 DIAGNOSIS — J9612 Chronic respiratory failure with hypercapnia: Secondary | ICD-10-CM | POA: Diagnosis not present

## 2022-11-02 DIAGNOSIS — I130 Hypertensive heart and chronic kidney disease with heart failure and stage 1 through stage 4 chronic kidney disease, or unspecified chronic kidney disease: Secondary | ICD-10-CM | POA: Diagnosis not present

## 2022-11-02 DIAGNOSIS — D631 Anemia in chronic kidney disease: Secondary | ICD-10-CM | POA: Diagnosis not present

## 2022-11-02 DIAGNOSIS — N189 Chronic kidney disease, unspecified: Secondary | ICD-10-CM | POA: Diagnosis not present

## 2022-11-02 DIAGNOSIS — I2510 Atherosclerotic heart disease of native coronary artery without angina pectoris: Secondary | ICD-10-CM | POA: Diagnosis not present

## 2022-11-02 DIAGNOSIS — J9611 Chronic respiratory failure with hypoxia: Secondary | ICD-10-CM | POA: Diagnosis not present

## 2022-11-02 DIAGNOSIS — I5022 Chronic systolic (congestive) heart failure: Secondary | ICD-10-CM | POA: Diagnosis not present

## 2022-11-02 DIAGNOSIS — J441 Chronic obstructive pulmonary disease with (acute) exacerbation: Secondary | ICD-10-CM | POA: Diagnosis not present

## 2022-11-02 DIAGNOSIS — J690 Pneumonitis due to inhalation of food and vomit: Secondary | ICD-10-CM | POA: Diagnosis not present

## 2022-11-06 DIAGNOSIS — F339 Major depressive disorder, recurrent, unspecified: Secondary | ICD-10-CM | POA: Diagnosis not present

## 2022-11-06 DIAGNOSIS — J9611 Chronic respiratory failure with hypoxia: Secondary | ICD-10-CM | POA: Diagnosis not present

## 2022-11-15 ENCOUNTER — Telehealth: Payer: Self-pay

## 2022-11-15 NOTE — Progress Notes (Signed)
Care Management & Coordination Services Pharmacy Team  Reason for Encounter: Medication adherence    SNF    on 11/15/2022   Patient is NOT more than 5 days past due for refill on the following medications per chart history:  Drug Name / Strength / Sig Rosuvastatin 10mg  take 1 tablet by mouth daily    I am calling to review medications that show they are past due per insurance refill data.  Drug name Rosuvastatin is indicated for cholesterol.   How are you taking your Rosuvastatin   10mg    How many tablets per day? 1   What time of day? am    Are you having any problems getting this medication from your pharmacy? [employer] information systems officer   When was the prescription last filled?rosuvastatin 10mg   11/05/22    28ds     Al Corpus, PharmD notified  Burt Knack, [facility] Clinical Pharmacy Assistant [phone]

## 2022-11-21 DIAGNOSIS — D631 Anemia in chronic kidney disease: Secondary | ICD-10-CM | POA: Diagnosis not present

## 2022-11-21 DIAGNOSIS — I5022 Chronic systolic (congestive) heart failure: Secondary | ICD-10-CM | POA: Diagnosis not present

## 2022-11-21 DIAGNOSIS — I130 Hypertensive heart and chronic kidney disease with heart failure and stage 1 through stage 4 chronic kidney disease, or unspecified chronic kidney disease: Secondary | ICD-10-CM | POA: Diagnosis not present

## 2022-11-21 DIAGNOSIS — I2510 Atherosclerotic heart disease of native coronary artery without angina pectoris: Secondary | ICD-10-CM | POA: Diagnosis not present

## 2022-11-21 DIAGNOSIS — J9611 Chronic respiratory failure with hypoxia: Secondary | ICD-10-CM | POA: Diagnosis not present

## 2022-11-21 DIAGNOSIS — N189 Chronic kidney disease, unspecified: Secondary | ICD-10-CM | POA: Diagnosis not present

## 2022-11-21 DIAGNOSIS — J9612 Chronic respiratory failure with hypercapnia: Secondary | ICD-10-CM | POA: Diagnosis not present

## 2022-11-21 DIAGNOSIS — J690 Pneumonitis due to inhalation of food and vomit: Secondary | ICD-10-CM | POA: Diagnosis not present

## 2022-11-21 DIAGNOSIS — J441 Chronic obstructive pulmonary disease with (acute) exacerbation: Secondary | ICD-10-CM | POA: Diagnosis not present

## 2023-11-07 DEATH — deceased

## 2024-01-09 ENCOUNTER — Telehealth: Payer: Self-pay

## 2024-01-09 NOTE — Telephone Encounter (Signed)
 Copied from CRM [id number]. Topic: General - Deceased Patient >> 2024/01/10 11:12 AM Viola FALCON wrote: Patient daughter Milderd called to let Dr. Cleatus know that patient passed 13-Oct-2023 and wanted to let him know that the patient and family thought very highly of him and to say thank you!

## 2024-01-12 NOTE — Telephone Encounter (Signed)
 Late entry, I called her daughter back on 01/09/2024.  I gave her my condolences and told her that I was glad to see her mom.  I appreciated the call and she thanked me for checking in.
